# Patient Record
Sex: Male | Born: 1935 | Race: White | Hispanic: No | Smoking: Never smoker
Health system: Southern US, Community
[De-identification: ages and names within clinical notes are randomized; demographics above are authoritative.]

## PROBLEM LIST (undated history)

## (undated) DIAGNOSIS — R413 Other amnesia: Secondary | ICD-10-CM

## (undated) DIAGNOSIS — W19XXXA Unspecified fall, initial encounter: Secondary | ICD-10-CM

## (undated) DIAGNOSIS — R6510 Systemic inflammatory response syndrome (SIRS) of non-infectious origin without acute organ dysfunction: Secondary | ICD-10-CM

## (undated) DIAGNOSIS — G20A1 Parkinson's disease without dyskinesia, without mention of fluctuations: Secondary | ICD-10-CM

## (undated) DIAGNOSIS — Z960 Presence of urogenital implants: Secondary | ICD-10-CM

## (undated) DIAGNOSIS — J111 Influenza due to unidentified influenza virus with other respiratory manifestations: Secondary | ICD-10-CM

## (undated) DIAGNOSIS — S12100A Unspecified displaced fracture of second cervical vertebra, initial encounter for closed fracture: Secondary | ICD-10-CM

## (undated) DIAGNOSIS — R296 Repeated falls: Secondary | ICD-10-CM

## (undated) DIAGNOSIS — Z978 Presence of other specified devices: Secondary | ICD-10-CM

## (undated) DIAGNOSIS — J189 Pneumonia, unspecified organism: Secondary | ICD-10-CM

## (undated) DIAGNOSIS — E119 Type 2 diabetes mellitus without complications: Secondary | ICD-10-CM

## (undated) DIAGNOSIS — F319 Bipolar disorder, unspecified: Secondary | ICD-10-CM

## (undated) DIAGNOSIS — A4902 Methicillin resistant Staphylococcus aureus infection, unspecified site: Secondary | ICD-10-CM

## (undated) DIAGNOSIS — N390 Urinary tract infection, site not specified: Secondary | ICD-10-CM

## (undated) DIAGNOSIS — G20 Parkinson's disease: Secondary | ICD-10-CM

## (undated) DIAGNOSIS — Z974 Presence of external hearing-aid: Secondary | ICD-10-CM

## (undated) DIAGNOSIS — M1990 Unspecified osteoarthritis, unspecified site: Secondary | ICD-10-CM

## (undated) DIAGNOSIS — G709 Myoneural disorder, unspecified: Secondary | ICD-10-CM

## (undated) DIAGNOSIS — I10 Essential (primary) hypertension: Secondary | ICD-10-CM

## (undated) DIAGNOSIS — F419 Anxiety disorder, unspecified: Secondary | ICD-10-CM

## (undated) DIAGNOSIS — F329 Major depressive disorder, single episode, unspecified: Secondary | ICD-10-CM

## (undated) DIAGNOSIS — N400 Enlarged prostate without lower urinary tract symptoms: Secondary | ICD-10-CM

## (undated) DIAGNOSIS — F32A Depression, unspecified: Secondary | ICD-10-CM

## (undated) HISTORY — DX: Urinary tract infection, site not specified: N39.0

## (undated) HISTORY — DX: Other amnesia: R41.3

## (undated) HISTORY — DX: Benign prostatic hyperplasia without lower urinary tract symptoms: N40.0

## (undated) HISTORY — DX: Unspecified fall, initial encounter: W19.XXXA

## (undated) HISTORY — DX: Bipolar disorder, unspecified: F31.9

## (undated) HISTORY — PX: TOTAL HIP ARTHROPLASTY: SHX124

## (undated) HISTORY — DX: Type 2 diabetes mellitus without complications: E11.9

## (undated) HISTORY — DX: Repeated falls: R29.6

## (undated) HISTORY — DX: Influenza due to unidentified influenza virus with other respiratory manifestations: J11.1

## (undated) HISTORY — DX: Parkinson's disease without dyskinesia, without mention of fluctuations: G20.A1

## (undated) HISTORY — DX: Parkinson's disease: G20

---

## 1998-04-19 ENCOUNTER — Ambulatory Visit (HOSPITAL_COMMUNITY): Admission: RE | Admit: 1998-04-19 | Discharge: 1998-04-19 | Payer: Self-pay | Admitting: Urology

## 1998-05-10 ENCOUNTER — Ambulatory Visit (HOSPITAL_COMMUNITY): Admission: RE | Admit: 1998-05-10 | Discharge: 1998-05-10 | Payer: Self-pay | Admitting: Urology

## 1998-05-19 ENCOUNTER — Other Ambulatory Visit: Admission: RE | Admit: 1998-05-19 | Discharge: 1998-05-19 | Payer: Self-pay | Admitting: Urology

## 1999-07-21 ENCOUNTER — Encounter (INDEPENDENT_AMBULATORY_CARE_PROVIDER_SITE_OTHER): Payer: Self-pay | Admitting: Specialist

## 1999-07-21 ENCOUNTER — Ambulatory Visit (HOSPITAL_COMMUNITY): Admission: RE | Admit: 1999-07-21 | Discharge: 1999-07-21 | Payer: Self-pay | Admitting: Gastroenterology

## 2000-07-26 ENCOUNTER — Ambulatory Visit: Admission: RE | Admit: 2000-07-26 | Discharge: 2000-07-26 | Payer: Self-pay | Admitting: Internal Medicine

## 2000-10-11 ENCOUNTER — Encounter: Admission: RE | Admit: 2000-10-11 | Discharge: 2000-10-11 | Payer: Self-pay | Admitting: Internal Medicine

## 2000-10-11 ENCOUNTER — Encounter: Payer: Self-pay | Admitting: Internal Medicine

## 2002-03-05 ENCOUNTER — Encounter: Admission: RE | Admit: 2002-03-05 | Discharge: 2002-03-05 | Payer: Self-pay | Admitting: General Surgery

## 2002-03-05 ENCOUNTER — Encounter: Payer: Self-pay | Admitting: General Surgery

## 2002-03-06 ENCOUNTER — Ambulatory Visit (HOSPITAL_BASED_OUTPATIENT_CLINIC_OR_DEPARTMENT_OTHER): Admission: RE | Admit: 2002-03-06 | Discharge: 2002-03-06 | Payer: Self-pay | Admitting: General Surgery

## 2002-08-21 ENCOUNTER — Encounter: Payer: Self-pay | Admitting: Emergency Medicine

## 2002-08-21 ENCOUNTER — Emergency Department (HOSPITAL_COMMUNITY): Admission: EM | Admit: 2002-08-21 | Discharge: 2002-08-21 | Payer: Self-pay | Admitting: Emergency Medicine

## 2004-08-25 ENCOUNTER — Encounter (INDEPENDENT_AMBULATORY_CARE_PROVIDER_SITE_OTHER): Payer: Self-pay | Admitting: *Deleted

## 2004-08-25 ENCOUNTER — Ambulatory Visit (HOSPITAL_COMMUNITY): Admission: RE | Admit: 2004-08-25 | Discharge: 2004-08-25 | Payer: Self-pay | Admitting: Gastroenterology

## 2004-10-24 ENCOUNTER — Observation Stay (HOSPITAL_COMMUNITY): Admission: EM | Admit: 2004-10-24 | Discharge: 2004-10-26 | Payer: Self-pay

## 2004-10-26 ENCOUNTER — Inpatient Hospital Stay (HOSPITAL_COMMUNITY): Admission: RE | Admit: 2004-10-26 | Discharge: 2004-11-03 | Payer: Self-pay | Admitting: Psychiatry

## 2004-10-26 ENCOUNTER — Ambulatory Visit: Payer: Self-pay | Admitting: Psychiatry

## 2004-11-04 ENCOUNTER — Emergency Department (HOSPITAL_COMMUNITY): Admission: EM | Admit: 2004-11-04 | Discharge: 2004-11-04 | Payer: Self-pay | Admitting: Emergency Medicine

## 2004-11-07 ENCOUNTER — Other Ambulatory Visit (HOSPITAL_COMMUNITY): Admission: RE | Admit: 2004-11-07 | Discharge: 2004-11-15 | Payer: Self-pay | Admitting: Psychiatry

## 2004-11-21 ENCOUNTER — Ambulatory Visit (HOSPITAL_COMMUNITY): Payer: Self-pay | Admitting: Psychiatry

## 2004-12-21 ENCOUNTER — Ambulatory Visit (HOSPITAL_COMMUNITY): Payer: Self-pay | Admitting: Psychiatry

## 2005-03-07 ENCOUNTER — Encounter: Admission: RE | Admit: 2005-03-07 | Discharge: 2005-03-07 | Payer: Self-pay | Admitting: Internal Medicine

## 2005-03-08 ENCOUNTER — Ambulatory Visit (HOSPITAL_COMMUNITY): Payer: Self-pay | Admitting: Psychiatry

## 2005-05-08 ENCOUNTER — Ambulatory Visit (HOSPITAL_COMMUNITY): Payer: Self-pay | Admitting: Psychiatry

## 2005-06-05 ENCOUNTER — Ambulatory Visit (HOSPITAL_COMMUNITY): Payer: Self-pay | Admitting: Psychiatry

## 2005-07-07 ENCOUNTER — Ambulatory Visit (HOSPITAL_COMMUNITY): Payer: Self-pay | Admitting: Psychiatry

## 2005-07-31 ENCOUNTER — Ambulatory Visit (HOSPITAL_COMMUNITY): Payer: Self-pay | Admitting: Psychiatry

## 2005-08-28 ENCOUNTER — Ambulatory Visit (HOSPITAL_COMMUNITY): Payer: Self-pay | Admitting: Psychiatry

## 2005-10-04 ENCOUNTER — Ambulatory Visit (HOSPITAL_COMMUNITY): Payer: Self-pay | Admitting: Psychiatry

## 2005-11-08 ENCOUNTER — Ambulatory Visit (HOSPITAL_COMMUNITY): Payer: Self-pay | Admitting: Psychiatry

## 2005-11-24 ENCOUNTER — Emergency Department (HOSPITAL_COMMUNITY): Admission: EM | Admit: 2005-11-24 | Discharge: 2005-11-24 | Payer: Self-pay | Admitting: Emergency Medicine

## 2005-12-20 ENCOUNTER — Ambulatory Visit (HOSPITAL_COMMUNITY): Payer: Self-pay | Admitting: Psychiatry

## 2006-01-17 ENCOUNTER — Ambulatory Visit (HOSPITAL_COMMUNITY): Payer: Self-pay | Admitting: Psychiatry

## 2006-03-12 ENCOUNTER — Ambulatory Visit (HOSPITAL_COMMUNITY): Payer: Self-pay | Admitting: Psychiatry

## 2006-05-07 ENCOUNTER — Ambulatory Visit (HOSPITAL_COMMUNITY): Payer: Self-pay | Admitting: Psychiatry

## 2006-06-13 ENCOUNTER — Ambulatory Visit (HOSPITAL_COMMUNITY): Payer: Self-pay | Admitting: Psychiatry

## 2006-06-26 ENCOUNTER — Inpatient Hospital Stay (HOSPITAL_COMMUNITY): Admission: RE | Admit: 2006-06-26 | Discharge: 2006-07-04 | Payer: Self-pay | Admitting: Orthopaedic Surgery

## 2006-08-13 ENCOUNTER — Ambulatory Visit (HOSPITAL_COMMUNITY): Payer: Self-pay | Admitting: Psychiatry

## 2006-10-08 ENCOUNTER — Ambulatory Visit (HOSPITAL_COMMUNITY): Payer: Self-pay | Admitting: Psychiatry

## 2006-12-03 ENCOUNTER — Ambulatory Visit (HOSPITAL_COMMUNITY): Payer: Self-pay | Admitting: Psychiatry

## 2007-02-11 ENCOUNTER — Ambulatory Visit (HOSPITAL_COMMUNITY): Payer: Self-pay | Admitting: Psychiatry

## 2007-04-03 ENCOUNTER — Ambulatory Visit (HOSPITAL_COMMUNITY): Payer: Self-pay | Admitting: Psychiatry

## 2007-06-10 ENCOUNTER — Ambulatory Visit (HOSPITAL_COMMUNITY): Payer: Self-pay | Admitting: Psychiatry

## 2007-08-05 ENCOUNTER — Ambulatory Visit (HOSPITAL_COMMUNITY): Payer: Self-pay | Admitting: Psychiatry

## 2007-09-06 ENCOUNTER — Emergency Department (HOSPITAL_COMMUNITY): Admission: EM | Admit: 2007-09-06 | Discharge: 2007-09-06 | Payer: Self-pay | Admitting: Emergency Medicine

## 2007-09-17 ENCOUNTER — Encounter: Admission: RE | Admit: 2007-09-17 | Discharge: 2007-09-17 | Payer: Self-pay | Admitting: Internal Medicine

## 2007-10-28 ENCOUNTER — Ambulatory Visit (HOSPITAL_COMMUNITY): Payer: Self-pay | Admitting: Psychiatry

## 2007-11-19 ENCOUNTER — Encounter: Admission: RE | Admit: 2007-11-19 | Discharge: 2007-12-31 | Payer: Self-pay | Admitting: Neurology

## 2007-12-02 ENCOUNTER — Ambulatory Visit (HOSPITAL_COMMUNITY): Payer: Self-pay | Admitting: Psychiatry

## 2007-12-06 ENCOUNTER — Encounter: Admission: RE | Admit: 2007-12-06 | Discharge: 2008-03-05 | Payer: Self-pay | Admitting: Internal Medicine

## 2008-01-01 ENCOUNTER — Ambulatory Visit (HOSPITAL_BASED_OUTPATIENT_CLINIC_OR_DEPARTMENT_OTHER): Admission: RE | Admit: 2008-01-01 | Discharge: 2008-01-01 | Payer: Self-pay | Admitting: Urology

## 2008-02-03 ENCOUNTER — Ambulatory Visit (HOSPITAL_COMMUNITY): Payer: Self-pay | Admitting: Psychiatry

## 2008-03-09 ENCOUNTER — Ambulatory Visit (HOSPITAL_COMMUNITY): Payer: Self-pay | Admitting: Psychiatry

## 2008-04-15 ENCOUNTER — Ambulatory Visit (HOSPITAL_COMMUNITY): Payer: Self-pay | Admitting: Psychiatry

## 2008-05-18 ENCOUNTER — Ambulatory Visit (HOSPITAL_COMMUNITY): Payer: Self-pay | Admitting: Psychiatry

## 2008-05-21 ENCOUNTER — Inpatient Hospital Stay (HOSPITAL_COMMUNITY): Admission: RE | Admit: 2008-05-21 | Discharge: 2008-05-25 | Payer: Self-pay | Admitting: Orthopaedic Surgery

## 2008-05-22 ENCOUNTER — Ambulatory Visit: Payer: Self-pay | Admitting: Physical Medicine & Rehabilitation

## 2008-05-25 ENCOUNTER — Inpatient Hospital Stay (HOSPITAL_COMMUNITY)
Admission: RE | Admit: 2008-05-25 | Discharge: 2008-06-02 | Payer: Self-pay | Admitting: Physical Medicine & Rehabilitation

## 2008-08-17 ENCOUNTER — Ambulatory Visit (HOSPITAL_COMMUNITY): Payer: Self-pay | Admitting: Psychiatry

## 2008-08-18 ENCOUNTER — Ambulatory Visit (HOSPITAL_BASED_OUTPATIENT_CLINIC_OR_DEPARTMENT_OTHER): Admission: RE | Admit: 2008-08-18 | Discharge: 2008-08-18 | Payer: Self-pay | Admitting: Urology

## 2008-10-19 ENCOUNTER — Ambulatory Visit (HOSPITAL_COMMUNITY): Payer: Self-pay | Admitting: Psychiatry

## 2009-01-27 ENCOUNTER — Ambulatory Visit (HOSPITAL_COMMUNITY): Payer: Self-pay | Admitting: Psychiatry

## 2009-04-19 ENCOUNTER — Ambulatory Visit (HOSPITAL_BASED_OUTPATIENT_CLINIC_OR_DEPARTMENT_OTHER): Admission: RE | Admit: 2009-04-19 | Discharge: 2009-04-19 | Payer: Self-pay | Admitting: Urology

## 2009-04-23 ENCOUNTER — Inpatient Hospital Stay (HOSPITAL_COMMUNITY): Admission: AD | Admit: 2009-04-23 | Discharge: 2009-05-03 | Payer: Self-pay | Admitting: Neurology

## 2009-04-29 ENCOUNTER — Ambulatory Visit: Payer: Self-pay | Admitting: Physical Medicine & Rehabilitation

## 2009-06-09 ENCOUNTER — Ambulatory Visit (HOSPITAL_COMMUNITY): Payer: Self-pay | Admitting: Psychiatry

## 2009-06-20 ENCOUNTER — Emergency Department (HOSPITAL_COMMUNITY): Admission: EM | Admit: 2009-06-20 | Discharge: 2009-06-21 | Payer: Self-pay | Admitting: Emergency Medicine

## 2009-07-10 ENCOUNTER — Emergency Department (HOSPITAL_COMMUNITY): Admission: EM | Admit: 2009-07-10 | Discharge: 2009-07-10 | Payer: Self-pay | Admitting: Emergency Medicine

## 2009-07-14 ENCOUNTER — Ambulatory Visit (HOSPITAL_COMMUNITY): Admission: RE | Admit: 2009-07-14 | Discharge: 2009-07-14 | Payer: Self-pay | Admitting: Urology

## 2009-08-16 ENCOUNTER — Ambulatory Visit (HOSPITAL_COMMUNITY): Payer: Self-pay | Admitting: Psychiatry

## 2009-09-06 ENCOUNTER — Emergency Department (HOSPITAL_COMMUNITY): Admission: EM | Admit: 2009-09-06 | Discharge: 2009-09-06 | Payer: Self-pay | Admitting: Emergency Medicine

## 2009-09-08 ENCOUNTER — Emergency Department (HOSPITAL_COMMUNITY): Admission: EM | Admit: 2009-09-08 | Discharge: 2009-09-09 | Payer: Self-pay | Admitting: Emergency Medicine

## 2009-09-28 ENCOUNTER — Encounter: Admission: RE | Admit: 2009-09-28 | Discharge: 2009-11-23 | Payer: Self-pay | Admitting: Internal Medicine

## 2009-10-01 ENCOUNTER — Emergency Department (HOSPITAL_COMMUNITY): Admission: EM | Admit: 2009-10-01 | Discharge: 2009-10-01 | Payer: Self-pay | Admitting: Emergency Medicine

## 2009-11-03 ENCOUNTER — Ambulatory Visit (HOSPITAL_COMMUNITY): Payer: Self-pay | Admitting: Psychiatry

## 2009-11-09 ENCOUNTER — Emergency Department (HOSPITAL_COMMUNITY): Admission: EM | Admit: 2009-11-09 | Discharge: 2009-11-09 | Payer: Self-pay | Admitting: Emergency Medicine

## 2009-11-18 ENCOUNTER — Emergency Department (HOSPITAL_COMMUNITY): Admission: EM | Admit: 2009-11-18 | Discharge: 2009-11-18 | Payer: Self-pay | Admitting: Emergency Medicine

## 2009-12-31 ENCOUNTER — Ambulatory Visit (HOSPITAL_COMMUNITY): Payer: Self-pay | Admitting: Psychiatry

## 2010-01-08 ENCOUNTER — Emergency Department (HOSPITAL_COMMUNITY): Admission: EM | Admit: 2010-01-08 | Discharge: 2010-01-08 | Payer: Self-pay | Admitting: Emergency Medicine

## 2010-02-22 ENCOUNTER — Encounter: Admission: RE | Admit: 2010-02-22 | Discharge: 2010-02-22 | Payer: Self-pay | Admitting: Internal Medicine

## 2010-03-04 ENCOUNTER — Ambulatory Visit (HOSPITAL_COMMUNITY): Payer: Self-pay | Admitting: Psychiatry

## 2010-05-11 ENCOUNTER — Emergency Department (HOSPITAL_COMMUNITY)
Admission: EM | Admit: 2010-05-11 | Discharge: 2010-05-11 | Payer: Self-pay | Source: Home / Self Care | Admitting: Emergency Medicine

## 2010-05-13 ENCOUNTER — Emergency Department (HOSPITAL_COMMUNITY)
Admission: EM | Admit: 2010-05-13 | Discharge: 2010-05-13 | Payer: Self-pay | Source: Home / Self Care | Admitting: Emergency Medicine

## 2010-05-27 ENCOUNTER — Ambulatory Visit (HOSPITAL_COMMUNITY): Payer: Self-pay | Admitting: Psychiatry

## 2010-06-10 ENCOUNTER — Ambulatory Visit (HOSPITAL_COMMUNITY): Payer: Self-pay | Admitting: Psychiatry

## 2010-08-02 ENCOUNTER — Encounter: Admission: RE | Admit: 2010-08-02 | Discharge: 2010-08-02 | Payer: Self-pay | Admitting: Internal Medicine

## 2010-08-10 ENCOUNTER — Ambulatory Visit (HOSPITAL_COMMUNITY): Payer: Self-pay | Admitting: Psychiatry

## 2010-09-20 ENCOUNTER — Encounter: Admission: RE | Admit: 2010-09-20 | Discharge: 2010-09-20 | Payer: Self-pay | Admitting: Internal Medicine

## 2010-10-07 ENCOUNTER — Ambulatory Visit (HOSPITAL_COMMUNITY): Payer: Self-pay | Admitting: Psychiatry

## 2010-11-04 ENCOUNTER — Encounter: Admission: RE | Admit: 2010-11-04 | Discharge: 2010-11-04 | Payer: Self-pay | Admitting: Internal Medicine

## 2010-11-06 ENCOUNTER — Emergency Department (HOSPITAL_COMMUNITY): Admission: EM | Admit: 2010-11-06 | Discharge: 2010-11-06 | Payer: Self-pay | Admitting: Emergency Medicine

## 2010-12-02 ENCOUNTER — Ambulatory Visit (HOSPITAL_COMMUNITY): Payer: Self-pay | Admitting: Psychiatry

## 2010-12-29 ENCOUNTER — Emergency Department (HOSPITAL_COMMUNITY)
Admission: EM | Admit: 2010-12-29 | Discharge: 2010-12-29 | Payer: Self-pay | Source: Home / Self Care | Admitting: Emergency Medicine

## 2011-01-27 ENCOUNTER — Encounter (HOSPITAL_COMMUNITY): Payer: Self-pay | Admitting: Psychiatry

## 2011-02-01 ENCOUNTER — Encounter (HOSPITAL_COMMUNITY): Payer: Self-pay | Admitting: Psychiatry

## 2011-02-08 ENCOUNTER — Encounter (HOSPITAL_COMMUNITY): Payer: Self-pay | Admitting: Psychiatry

## 2011-02-24 ENCOUNTER — Encounter (HOSPITAL_COMMUNITY): Payer: BC Managed Care – PPO | Admitting: Psychiatry

## 2011-02-27 ENCOUNTER — Encounter (HOSPITAL_COMMUNITY): Payer: Medicare Other | Admitting: Psychiatry

## 2011-02-27 DIAGNOSIS — F3189 Other bipolar disorder: Secondary | ICD-10-CM

## 2011-02-28 LAB — URINALYSIS, ROUTINE W REFLEX MICROSCOPIC
Glucose, UA: NEGATIVE mg/dL
pH: 6.5 (ref 5.0–8.0)

## 2011-02-28 LAB — URINE MICROSCOPIC-ADD ON

## 2011-02-28 LAB — URINE CULTURE

## 2011-03-03 ENCOUNTER — Other Ambulatory Visit: Payer: Self-pay | Admitting: Internal Medicine

## 2011-03-03 DIAGNOSIS — Z8781 Personal history of (healed) traumatic fracture: Secondary | ICD-10-CM

## 2011-03-05 LAB — URINE MICROSCOPIC-ADD ON

## 2011-03-05 LAB — URINALYSIS, ROUTINE W REFLEX MICROSCOPIC
Glucose, UA: NEGATIVE mg/dL
pH: 7.5 (ref 5.0–8.0)

## 2011-03-05 LAB — URINE CULTURE

## 2011-03-06 LAB — URINE MICROSCOPIC-ADD ON

## 2011-03-06 LAB — URINALYSIS, ROUTINE W REFLEX MICROSCOPIC
Glucose, UA: NEGATIVE mg/dL
Protein, ur: NEGATIVE mg/dL
pH: 7 (ref 5.0–8.0)

## 2011-03-06 LAB — URINE CULTURE: Colony Count: >=100000

## 2011-03-13 ENCOUNTER — Other Ambulatory Visit: Payer: BC Managed Care – PPO

## 2011-03-14 ENCOUNTER — Ambulatory Visit
Admission: RE | Admit: 2011-03-14 | Discharge: 2011-03-14 | Disposition: A | Discharge status: Home / Self Care | Payer: BC Managed Care – PPO | Source: Ambulatory Visit | Attending: Internal Medicine | Admitting: Internal Medicine

## 2011-03-14 ENCOUNTER — Other Ambulatory Visit: Payer: BC Managed Care – PPO

## 2011-03-14 DIAGNOSIS — Z8781 Personal history of (healed) traumatic fracture: Secondary | ICD-10-CM

## 2011-03-21 LAB — URINALYSIS, ROUTINE W REFLEX MICROSCOPIC
Glucose, UA: NEGATIVE mg/dL
Nitrite: NEGATIVE
Protein, ur: NEGATIVE mg/dL

## 2011-03-21 LAB — URINE CULTURE: Colony Count: >=100000

## 2011-03-21 LAB — URINE MICROSCOPIC-ADD ON

## 2011-03-22 LAB — URINALYSIS, ROUTINE W REFLEX MICROSCOPIC
Bilirubin Urine: NEGATIVE
Glucose, UA: NEGATIVE mg/dL
Hgb urine dipstick: NEGATIVE
Ketones, ur: NEGATIVE mg/dL
Protein, ur: NEGATIVE mg/dL

## 2011-03-22 LAB — URINE CULTURE: Colony Count: >=100000

## 2011-03-22 LAB — GLUCOSE, CAPILLARY: Glucose-Capillary: 101 mg/dL — ABNORMAL HIGH (ref 70–99)

## 2011-03-22 LAB — URINE MICROSCOPIC-ADD ON

## 2011-03-24 LAB — URINALYSIS, ROUTINE W REFLEX MICROSCOPIC
Bilirubin Urine: NEGATIVE
Glucose, UA: NEGATIVE mg/dL
Hgb urine dipstick: NEGATIVE
Ketones, ur: NEGATIVE mg/dL
Ketones, ur: NEGATIVE mg/dL
Nitrite: POSITIVE — AB
Protein, ur: NEGATIVE mg/dL
pH: 7 (ref 5.0–8.0)
pH: 7.5 (ref 5.0–8.0)

## 2011-03-24 LAB — URINE CULTURE: Colony Count: 90000

## 2011-03-24 LAB — URINE MICROSCOPIC-ADD ON

## 2011-03-26 LAB — URINALYSIS, ROUTINE W REFLEX MICROSCOPIC
Bilirubin Urine: NEGATIVE
Bilirubin Urine: NEGATIVE
Glucose, UA: NEGATIVE mg/dL
Ketones, ur: NEGATIVE mg/dL
Nitrite: POSITIVE — AB
Specific Gravity, Urine: 1.019 (ref 1.005–1.030)
Specific Gravity, Urine: 1.014 (ref 1.005–1.030)
Urobilinogen, UA: 1 mg/dL (ref 0.0–1.0)
pH: 7 (ref 5.0–8.0)

## 2011-03-26 LAB — BASIC METABOLIC PANEL
BUN: 25 mg/dL — ABNORMAL HIGH (ref 6–23)
CO2: 27 mEq/L (ref 19–32)
Calcium: 9.5 mg/dL (ref 8.4–10.5)
Creatinine, Ser: 1.09 mg/dL (ref 0.4–1.5)
GFR calc non Af Amer: >60 mL/min (ref >60)
Glucose, Bld: 97 mg/dL (ref 70–99)
Sodium: 140 mEq/L (ref 135–145)

## 2011-03-26 LAB — URINE CULTURE: Colony Count: 85000

## 2011-03-26 LAB — GLUCOSE, CAPILLARY
Glucose-Capillary: 113 mg/dL — ABNORMAL HIGH (ref 70–99)
Glucose-Capillary: 138 mg/dL — ABNORMAL HIGH (ref 70–99)
Glucose-Capillary: 85 mg/dL (ref 70–99)

## 2011-03-26 LAB — CBC
Hemoglobin: 12.7 g/dL — ABNORMAL LOW (ref 13.0–17.0)
MCHC: 33.1 g/dL (ref 30.0–36.0)
Platelets: 344 10*3/uL (ref 150–400)
RDW: 15.6 % — ABNORMAL HIGH (ref 11.5–15.5)

## 2011-03-26 LAB — URINE MICROSCOPIC-ADD ON

## 2011-03-28 LAB — GLUCOSE, CAPILLARY
Glucose-Capillary: 109 mg/dL — ABNORMAL HIGH (ref 70–99)
Glucose-Capillary: 117 mg/dL — ABNORMAL HIGH (ref 70–99)
Glucose-Capillary: 88 mg/dL (ref 70–99)
Glucose-Capillary: 100 mg/dL — ABNORMAL HIGH (ref 70–99)
Glucose-Capillary: 102 mg/dL — ABNORMAL HIGH (ref 70–99)
Glucose-Capillary: 104 mg/dL — ABNORMAL HIGH (ref 70–99)
Glucose-Capillary: 107 mg/dL — ABNORMAL HIGH (ref 70–99)
Glucose-Capillary: 121 mg/dL — ABNORMAL HIGH (ref 70–99)
Glucose-Capillary: 122 mg/dL — ABNORMAL HIGH (ref 70–99)
Glucose-Capillary: 124 mg/dL — ABNORMAL HIGH (ref 70–99)
Glucose-Capillary: 127 mg/dL — ABNORMAL HIGH (ref 70–99)
Glucose-Capillary: 128 mg/dL — ABNORMAL HIGH (ref 70–99)
Glucose-Capillary: 140 mg/dL — ABNORMAL HIGH (ref 70–99)
Glucose-Capillary: 142 mg/dL — ABNORMAL HIGH (ref 70–99)
Glucose-Capillary: 151 mg/dL — ABNORMAL HIGH (ref 70–99)
Glucose-Capillary: 87 mg/dL (ref 70–99)
Glucose-Capillary: 88 mg/dL (ref 70–99)

## 2011-03-28 LAB — COMPREHENSIVE METABOLIC PANEL
AST: 10 U/L (ref 0–37)
AST: 15 U/L (ref 0–37)
Albumin: 2.9 g/dL — ABNORMAL LOW (ref 3.5–5.2)
BUN: 16 mg/dL (ref 6–23)
CO2: 20 mEq/L (ref 19–32)
CO2: 26 mEq/L (ref 19–32)
Calcium: 8.9 mg/dL (ref 8.4–10.5)
Chloride: 108 mEq/L (ref 96–112)
Creatinine, Ser: 1.25 mg/dL (ref 0.4–1.5)
Creatinine, Ser: 4.47 mg/dL — ABNORMAL HIGH (ref 0.4–1.5)
GFR calc Af Amer: 16 mL/min — ABNORMAL LOW (ref >60)
GFR calc Af Amer: >60 mL/min (ref >60)
GFR calc non Af Amer: 13 mL/min — ABNORMAL LOW (ref >60)
GFR calc non Af Amer: 57 mL/min — ABNORMAL LOW (ref >60)
Glucose, Bld: 95 mg/dL (ref 70–99)
Total Bilirubin: 0.7 mg/dL (ref 0.3–1.2)

## 2011-03-28 LAB — DIFFERENTIAL
Basophils Absolute: 0 10*3/uL (ref 0.0–0.1)
Eosinophils Absolute: 0.1 10*3/uL (ref 0.0–0.7)
Eosinophils Relative: 1 % (ref 0–5)
Lymphocytes Relative: 5 % — ABNORMAL LOW (ref 12–46)

## 2011-03-28 LAB — BASIC METABOLIC PANEL
BUN: 19 mg/dL (ref 6–23)
Creatinine, Ser: 1.27 mg/dL (ref 0.4–1.5)
GFR calc non Af Amer: 56 mL/min — ABNORMAL LOW (ref >60)
Glucose, Bld: 218 mg/dL — ABNORMAL HIGH (ref 70–99)
Potassium: 3.7 mEq/L (ref 3.5–5.1)

## 2011-03-28 LAB — CBC
HCT: 43.9 % (ref 39.0–52.0)
Hemoglobin: 14.8 g/dL (ref 13.0–17.0)
MCHC: 34.2 g/dL (ref 30.0–36.0)
MCV: 92.1 fL (ref 78.0–100.0)
MCV: 93.3 fL (ref 78.0–100.0)
Platelets: 257 10*3/uL (ref 150–400)
RBC: 4.7 MIL/uL (ref 4.22–5.81)
WBC: 13.1 10*3/uL — ABNORMAL HIGH (ref 4.0–10.5)

## 2011-03-28 LAB — POCT I-STAT 4, (NA,K, GLUC, HGB,HCT)
HCT: 44 % (ref 39.0–52.0)
Hemoglobin: 15 g/dL (ref 13.0–17.0)

## 2011-03-28 LAB — URINALYSIS, ROUTINE W REFLEX MICROSCOPIC
Bilirubin Urine: NEGATIVE
Specific Gravity, Urine: 1.011 (ref 1.005–1.030)
pH: 6.5 (ref 5.0–8.0)

## 2011-03-28 LAB — TSH: TSH: 3.044 u[IU]/mL (ref 0.350–4.500)

## 2011-03-28 LAB — URINE MICROSCOPIC-ADD ON

## 2011-03-28 LAB — PROTIME-INR: Prothrombin Time: 12.9 seconds (ref 11.6–15.2)

## 2011-03-28 LAB — LITHIUM LEVEL: Lithium Lvl: 1.02 mEq/L (ref 0.80–1.40)

## 2011-04-24 ENCOUNTER — Encounter (INDEPENDENT_AMBULATORY_CARE_PROVIDER_SITE_OTHER): Payer: BC Managed Care – PPO | Admitting: Psychiatry

## 2011-04-24 DIAGNOSIS — F3189 Other bipolar disorder: Secondary | ICD-10-CM

## 2011-05-02 NOTE — Op Note (Signed)
NAME:  [name] NO.:  [id number]   MEDICAL RECORD NO.:  [id number]          PATIENT TYPE:  INP   LOCATION:  5001                         FACILITY:  [facility]   PHYSICIAN:  Claude Manges. Whitfield, M.D.DATE OF BIRTH:  10/03/1935   DATE OF PROCEDURE:  05/21/2008  DATE OF DISCHARGE:                               OPERATIVE REPORT   PREOPERATIVE DIAGNOSIS:  End-stage osteoarthritis, right hip.   POSTOPERATIVE DIAGNOSIS:  End-stage osteoarthritis, right hip.   PROCEDURE:  Right total hip replacement.   SURGEON:  Claude Manges. Cleophas Dunker, MD.   ASSISTANTThereasa Distance A. Chaney Malling, MD, and Oris Drone. Petrarca, P.A.-C.   ANESTHESIA:  General orotracheal.   COMPLICATIONS:  None.   COMPONENTS:  DePuy AML small stature 15-mm femoral component, a 36-mm  outer diameter hip ball with a 5-mm neck length, a 58-mm outer diameter  metallic acetabular component with the Marathon polyethylene liner 10-  degree posterior lip.  All were press fit.   PROCEDURE IN DETAILS:  The patient was comfortable on the operating  table; and under general orotracheal anesthesia, nursing staff inserted  a Foley catheter.  There was noted to be slight amount of blood after  several minutes.  It was clear on the initial insertion.   The patient was then placed in the lateral decubitus position with the  right side up and secured to the operating room table with the Innomed  hip system.   The right hip was then prepped with Betadine scrub and DuraPrep from the  iliac crest to below the knee.  Sterile draping was performed.   A routine southern incision was utilized and via sharp dissection  carried down to subcutaneous tissue.  Any gross bleeders were Bovie  coagulated.  The iliotibial band was identified and incised along the  skin insertion.  Self-retaining retractors were inserted.  The short  external rotators were identified.  There were incised from their  posterior attachment to the greater  trochanter.  Tendinous structures  were tagged with 0 Ethibond suture.  Capsule was identified and incised  on the femoral neck and head.  There was immediate clear yellow joint  effusion and bulging of beefy-red synovitis.   The head was then dislocated posteriorly.  There were large osteophytes  with malformation of the head.  It was osteotomized about a  fingerbreadth proximal to the lesser trochanter using the calcar guide.  The acetabulum was inspected.  There was abundant proliferative beefy-  red synovitis.  Synovectomy was performed.   The femoral neck was easily identified.  Retractors were placed about  it.  The piriformis fossa was cleared of any soft tissue.  A starter  hole was then made followed by reaming to 14.5 mm to accept the 15-mm  prosthesis.  Rasping was then performed sequentially to 15 mm small with  a perfect fit on the calcar.   Retractor was then placed about the acetabulum.  Any further synovitis  was removed.  Reaming was performed sequentially to 57 mm outer diameter  to accept a 58 prosthesis.  We trialed a 56 and a 58  component.  We had  complete rim fit of the 56.  We had very nice rim fit in the 58; it  would not completely sit.  Accordingly, we used a 100 series AML  metallic cup and impacted it into the acetabulum.  We inserted the trial  polyethylene liner.   The 15-mm small stature rasp was then impacted flush on the femoral  calcar.  We trialed several neck lengths with a 36-mm hip ball,  felt  that the +5 maintained our leg length evenly, and we had excellent  stability with flexion/adduction.   The wound was then copiously irrigated with saline solution.  The trial  components were removed.  The joint was again irrigated with saline  solution.  The apex hole eliminator was inserted followed by the final  polyethylene Marathon liner with a 10-degree posterior lip.  Wound was  again irrigated with saline solution.   The final femoral  component was then impacted flush on the calcar.  We  again trialed a +5 neck length, 36-mm hip ball with perfect stability.   The hip was then dislocated.  The trial hip ball was removed.  The final  36-mm hip ball with a +5 neck was then inserted on the [facility] taper neck.  This was reduced, and again through full range of motion, we had perfect  stability in flexion/extension.  I felt that leg lengths were  symmetrical.   Wound was again irrigated with saline solution.  The capsule was then  closed anatomically with #1 Ethibond.  The short external rotator was  closed in similar fashion with similar material.  I could palpate the  sciatic nerve throughout the procedure, and it was protected.   The iliotibial band was then closed with a running 0 Vicryl, the subcu  with 0 and 2-0 Vicryl, and skin closed with skin clips.  Sterile bulky  dressing was applied.  The patient was then awoken and placed supine on  the operating stretcher and returned to the postanesthesia recovery room  in satisfactory condition.      Claude Manges. Cleophas Dunker, M.D.  Electronically Signed     PWW/MEDQ  D:  05/21/2008  T:  05/22/2008  Job:  [id number]

## 2011-05-02 NOTE — Op Note (Signed)
NAME:  [name], [name] NO.:  [id number]   MEDICAL RECORD NO.:  [id number]          PATIENT TYPE:  AMB   LOCATION:  NESC                         [facility]ACILITY:  [facility]   PHYSICIAN:  Martina Sinner, MD DATE OF BIRTH:  23-Nov-1935   DATE OF PROCEDURE:  08/18/2008  DATE OF DISCHARGE:                               OPERATIVE REPORT   PREOPERATIVE DIAGNOSES:  Muscle spasm, urge incontinence, neurogenic  bladder.   POSTOPERATIVE DIAGNOSES:  Muscle spasm, urge incontinence, neurogenic  bladder, meatal stenosis and small bladder calculi.   SURGERY:  Cystoscopy, bladder hydrodistention, transurethral injection  of Botox, transurethral removal of bladder stones and urethral  dilatation.   [name] has the above diagnoses.  He consented to 100 units of  Botox.  In spite of using the obturator, it was obvious that he had mild  meatal stenosis and the larger scope was not going to be inserted into  his bladder.  I dilated it with male sounds to 36 [place].  I therefore  inserted the ACMI scope with the obturator well lubricated in the mid  urethra, and directly visualized the scope entering through the urethra  into the bladder.  There was no question he had a longer prostatic  urethra, bilobar enlargement of the prostate.  The membranous bulbar and  penile urethra were otherwise normal.  He did not have a stricture.  He  did have oozing from a friable bladder neck.  He had grade II/IV bladder  trabeculation.  He had some small calculi probably 1 mm and possibly 2  mm in size.   He was hydrodistended to 600 mL.  He did not have findings of  interstitial cystitis.   Using 100 units of Botox in the ACMI scope, I injected 100 units of  Botox in 20 sites using my usual template at 5 and 7 o'clock and  cephalad to the interureteric ridge  with 0.5 mL per injection into the  muscle.   I then used a regular 22-French scope and irrigated out all his bladder  calculi.  It  was sand-like and I did not send it for analysis.  He had a  little bit of bleeding from the bladder neck and a small clot was  irrigated.   I put an 18-French catheter in at the end of the case and the urine was  clear.  I will keep it in and then likely take it out in the recovery  room.  I will keep a close eye on [name] residuals since he is  at much higher risk of retention.           ______________________________  Martina Sinner, MD  Electronically Signed     SAM/MEDQ  D:  08/18/2008  T:  08/18/2008  Job:  [id number]

## 2011-05-02 NOTE — Op Note (Signed)
NAME:  JAHAZIEL, FRANCOIS NO.:  [id number]   MEDICAL RECORD NO.:  [id number]          PATIENT TYPE:  AMB   LOCATION:  DAY                          FACILITY:  [facility]   PHYSICIAN:  Sigmund I. Patsi Sears, M.D.DATE OF BIRTH:  June 15, 1935   DATE OF PROCEDURE:  07/14/2009  DATE OF DISCHARGE:  07/14/2009                               OPERATIVE REPORT   PREOPERATIVE DIAGNOSIS:  Urethral bleeding.   POSTOPERATIVE DIAGNOSIS:  Urethral bleeding.   OPERATION:  Cystourethroscopy, electrocoagulation of urethral erosion,  cystoscopy, and bladder clot evacuation.   SURGEON:  Dr. Patsi Sears.   ANESTHESIA:  General LMA.   OPERATION:  After appropriate preanesthesia, the patient was brought to  the operating room and placed on the operating room in dorsal supine  position where general LMA anesthesia was induced.  He was then placed  in a dorsal lithotomy position where the pubis was prepped with Betadine  solution and draped in usual fashion.   HISTORY:  This 76 year old male disabled secondary to Parkinson's  disease, with chronic Foley catheter for chronic urinary retention,  developed severe gross hematuria with cystoscopy showing urethral  arterial bleed at the 6 o'clock position at the penoscrotal junction.  The patient was brought to the operating room tonight for fulguration.   PROCEDURE:  Cystourethroscopy was accomplished after the Foley catheter  removed, with hematuria and clot noted within the urethra.  Arterial  bleeding point was noted at the 6 o'clock position in the membranous  urethra at the penoscrotal junction, and this was electrocoagulated with  the Bugbee electrode.  Cystoscopy revealed clot in the bladder, but no  tumor was identified.  The prostate appeared somewhat edematous and had  bilobar BPH, but there was no evidence of bleeding.  The bladder was  evacuated of clot with the piston syringe.  A 22-Foley catheter was  replaced and the balloon inflated.   The patient was awakened and taken  to recovery room in good condition.      Sigmund I. Patsi Sears, M.D.  Electronically Signed     SIT/MEDQ  D:  07/14/2009  T:  07/15/2009  Job:  [id number]

## 2011-05-02 NOTE — Op Note (Signed)
NAME:  SHIGEO, BAUGH NO.:  [id number]   MEDICAL RECORD NO.:  [id number]         PATIENT TYPE:  LINP   LOCATION:                               FACILITY:  [facility]   PHYSICIAN:  Ronald L. Earlene Plater, M.D.  DATE OF BIRTH:  1935/04/28   DATE OF PROCEDURE:  01/01/2008  DATE OF DISCHARGE:                               OPERATIVE REPORT   DIAGNOSIS:  Right hydrocele.   PROCEDURE:  Right hydrocelectomy.   SURGEON:  Gaynelle Arabian, M.D.   ANESTHESIA:  LMA.   BLOOD LOSS:  20 mL.   TUBES:  Quarter-inch Penrose drain.   COMPLICATIONS:  None.   INDICATIONS FOR PROCEDURE:  [name] is a very nice 76 year old white male  who presents with an enlarging right hydrocele.  On scrotal ultrasound,  the testicle appeared to be normal but is a tense uncomfortable, right  hydrocele.  After understanding risks, benefits and alternatives, he has  elected to proceed with repair.   PROCEDURE IN DETAIL:  The patient was placed in supine position.  After  proper LMA anesthesia, he was prepped and draped with Betadine in  sterile fashion.  A transverse scrotal incision was made to include a  very small sebaceous cyst in the scrotum.  The tunic vaginalis was  incised and straw-colored fluid was removed.  The testicle was delivered  and appeared to be essentially normal.  Redundant hydrocele sac was  excised.  It was quite thin.  Good hemostasis was noted be present, and  a bottle-neck type repair was performed with running 3-0 chromic catgut.  No no restriction of the blood supply was noted.  Thorough irrigation  was performed.  Good hemostasis noted present.  The testicle and  contents were placed back into the scrotal sac.  A quarter-inch Penrose  drain was placed through a separate stab incision and sutured in place  with 3-0 chromic catgut.  The dartos tunic was closed with a running 3-0  chromic catgut, and the skin was closed with a running horizontal  mattress 4-0 chromic catgut and  dressed with fluffs and a scrotal  support.  The patient tolerated the procedure well.  No complications.  He was taken to the recovery room stable.      Ronald L. Earlene Plater, M.D.  Electronically Signed     RLD/MEDQ  D:  01/01/2008  T:  01/01/2008  Job:  [id number]

## 2011-05-02 NOTE — Discharge Summary (Signed)
NAME:  [name] NO.:  [id number]   MEDICAL RECORD NO.:  [id number]          PATIENT TYPE:  INP   LOCATION:  3038                         FACILITY:  [facility]   PHYSICIAN:  Marlan Palau, M.D.  DATE OF BIRTH:  1935/04/06   DATE OF ADMISSION:  04/23/2009  DATE OF DISCHARGE:  05/03/2009                               DISCHARGE SUMMARY   ADMISSION DIAGNOSES:  1. Parkinson's disease.  2. Organic brain syndrome.  3. Bipolar disorder.  4. Gait disorder.   DISCHARGE DIAGNOSES:  1. Parkinson's disease.  2. Gait disorder.  3. Organic brain syndrome.  4. Bipolar disorder.  5. Diabetes.  6. Obstructive uropathy with acute renal failure.   PROCEDURES DURING THIS ADMISSION:  CT scan of the brain.   COMPLICATIONS ABOVE PROCEDURES:  None.   HISTORY OF PRESENT ILLNESS:  [name] is a 76 year old right-handed  white male with a history of Parkinson's disease.  This patient has had  bipolar disorder but has had a very rapid decline in functional level  over the last several weeks prior to this admission.  The patient had  become increasingly confused, hallucinating, and having problems with  p.o. intake of food and fluids.  The patient has basically not been  ambulatory prior to admission and gotten to the point where the wife can  no longer care for him in the home environment.  The patient will fall  on the floor and cannot get up.  The patient has been increasingly  agitated.  The patient is brought into the hospital for an evaluation of  the above problem.   PAST MEDICAL HISTORY:  1. Parkinson disease.  2. Gait disorder.  3. Organic brain syndrome.  4. Bipolar disorder.  5. Diabetes.  6. Hypertension.  7. Dyslipidemia.  8. Total hip replacement on the left.  9. Left shoulder arthritis.  10.Benign prostatic hypertrophy.  11.Obstructive uropathy on admission.  12.Lithium toxicity secondary to obstructive uropathy.   MEDICATIONS PRIOR TO ADMISSION:  1. Lamictal 150 mg tablets 1 twice daily.  2. Metformin 500 mg twice daily.  3. Hydrochlorothiazide 25 mg daily.  4. Temazepam 30 mg as needed for sleep.  5. Ramipril 5 mg one twice daily.  6. Lithium 300 mg in the morning and 600 mg in the evening.  7. Zoloft 100 mg daily.  8. Aspirin 81 mg daily.  9. Fish Oil tablets daily.  10.Sinemet 25/100 one 3 times daily.  11.Flomax 0.4 mg 1 at night.   The patient has no known allergies and does not smoke.  Drinks wine on  occasion.   Please refer to history and physical dictation summary for family  history, social history, and physical examination.   REVIEW OF SYSTEMS:  Laboratory values at this time are notable for:  White count of 9.5, hemoglobin 12.5, hematocrit 36.7, MCV of 92.1  platelets of 257,000.  Sodium 141, potassium 3.3, chloride of 113, CO2  26, glucose of 121, BUN of 16, creatinine of 1.25, total bili 0.8,  alkaline phosphatase of 71, SGOT of 10, SGPT of less than 8, total  protein 5, albumin 2.9,  calcium 8.9, TSH of 3.044.  Initial lithium  level is 1.85.  Urinalysis reveals specific gravity of 0.011, pH of 6.5,  100 mg/dl protein, 0-2 white cells.  A repeat lithium level at a lower  dose of 300 mg twice daily was 1.02.   HOSPITAL COURSE:  This patient was admitted for evaluation of a decline  in functional level with increased agitation, confusion, and decreased  ability to ambulate.  The patient had recently gotten Botox injections  in the bladder and subsequently had urinary outflow obstruction that was  in part related to this.  The patient was admitted with an obstructive  uropathy, a creatinine of greater than 4.  The patient had a Foley  catheter placed and has had subsequent normalization of renal function.  Lithium toxicity was noted with this.  Lithium dosing was reduced to 300  mg twice daily and repeat lithium level was in the therapeutic range.  The patient was increased on Sinemet 25/100 one 4 times daily.   The  patient has been seen by Physical and Occupational Therapy.  Over time,  the patient's cognitive functioning has improved somewhat but the  patient continues to have a significant memory impairment.  The  patient's stiffness and apraxia has also improved.  The patient still  was not functionally ambulatory.  The patient at this point cannot be  managed in the home environment.  The patient is to be transferred to an  extended care facility with rehab.  The patient will need followup with  his neurologist, Dr. Jacquelyne Balint, to determine when the Foley catheter can  be removed.   DISCHARGE MEDICATIONS:  1. Norvasc 5 mg daily was added due to problems with elevated high      blood pressure.  2. Aspirin 81 mg daily.  3. Sinemet 25.100 one 4 times daily.  4. Comptan was added 200 mg 1 four times daily.  5. Hydrochlorothiazide 25 mg daily.  6. Sliding-scale insulin.  7. Lamictal 150 mg twice daily.  8. Lithium 300 mg twice daily.  9. Metformin has been on hold taking 500 mg twice daily.  10.Potassium 20 mEq daily.  11.Seroquel 25 mg at night.  12.Altace 5 mg b.i.d.  13.Zoloft 100 mg daily.  14.Flomax 0.4 mg at night.  15.Ambien 5 mg at night if needed.   The patient will follow with [facility] within 2-3  months following discharge.  Again, he will need a Urology evaluation as  well.  At the time of the discharge, the patient is alert, cooperative.  Agitation is much better.  Patient has orientation to person and place,  knows the year and month.  The patient still was nonambulatory.      Marlan Palau, M.D.  Electronically Signed     CKW/MEDQ  D:  05/03/2009  T:  05/03/2009  Job:  [id number]   cc:   [facility]

## 2011-05-02 NOTE — H&P (Signed)
NAME:  RACHEL, SAMPLES NO.:  [id number]   MEDICAL RECORD NO.:  [id number]          PATIENT TYPE:  INP   LOCATION:  3038                         FACILITY:  [facility]   PHYSICIAN:  Marlan Palau, M.D.  DATE OF BIRTH:  07-Feb-1935   DATE OF ADMISSION:  04/23/2009  DATE OF DISCHARGE:                              HISTORY & PHYSICAL   HISTORY OF PRESENT ILLNESS:  Zacchary Pompei is a 76 year old right-handed  white male with a history of Parkinson disease.  The patient has a  history of bipolar disorder as well but has had a rapid decline in  functional level over the last several weeks.  The patient has gotten to  the point where he is confused, hallucinating, agitated at times with  decreased p.o. intake of food and fluids.  The patient fortunately has  not sustained significant injury with any of his falls.  The patient  comes in to the hospital for management of this change in his functional  level.   PAST MEDICAL HISTORY:  Significant for:  1. History of Parkinson disease.  2. Gait disorder.  3. Organic brain syndrome.  4. Bipolar disorder.  5. Diabetes.  6. Hypertension.  7. Dyslipidemia.  8. Total hip replacement on the left.  9. Left shoulder arthritis.  10.Benign prostatic hypertrophy.   Medications at this time include:  1. Lamictal 1 tablet twice daily of 150 mg tablets.  2. Metformin 500 mg twice daily.  3. Hydrochlorothiazide 25 mg daily.  4. Temazepam 30 mg at night if needed for sleep.  5. Ramipril 5 mg one twice daily.  6. Lithium carbonate 300 mg 1 in the morning and 2 in the evening.  7. Zoloft 100 mg daily.  8. Aspirin 81 mg daily.  9. Fish oil tablets daily.  10.Sinemet 25/100 one 3 times daily.  11.Flomax 0.4 mg 1 at night.   The patient has no known allergies.   He does not smoke, drinks a glass of wine daily.   FAMILY MEDICAL HISTORY:  Notable that father passed away due to suicide.  Mother passed away with cancer, heart disease.  The  patient is a no  brothers or sisters.  The patient has one half-brother, who is alive and  well.   REVIEW OF SYSTEMS:  Notable for no recent fevers or chills.  The patient  denies headache, neck pain, back pain.  He does have some decreased p.o.  intake of food and fluids.  Denies shortness of breath, chest pains,  abdominal pains, nausea, or vomiting.  The patient does note some  constipation.  Denies any new numbness or weakness on the arms or legs.   PHYSICAL EXAMINATION:  VITAL SIGNS:  Blood pressure is 144/70, heart  rate 52, respiratory rate 16, temperature afebrile.  GENERAL:  The patient is a fairly well-developed white male, who is  confused but alert at the time of examination.  HEENT:  Head is atraumatic.  Eyes, pupils are round and reactive to  light.  Disks are flat bilaterally.  NECK:  Supple.  No carotid bruits noted.  RESPIRATORY:  Clear.  CARDIOVASCULAR:  Regular rate and rhythm.  No obvious murmurs or rubs  noted.  EXTREMITIES:  Without significant edema.  ABDOMEN:  Positive bowel sounds.  No organomegaly or tenderness noted.  NEUROLOGIC:  Cranial nerves as above.  Facial symmetry is present.  The  patient has some decrease in superior gaze.  Extraocular movements  otherwise are full.  Visual fields are full.  Speech is slightly  dysarthric.  The patient has good pinprick sensation on the face  bilaterally.  He has good strength of the facial muscle and muscles of  the head turn and shoulder shrug bilaterally.  No definite aphasia is  noted.  Motor testing reveals fairly good strength at all fours, but the  patient has apraxia with the use of the lower extremities making it  difficult to test.  Some increased tone in the extremities, particularly  lower extremity is noted.  The patient has fair finger-nose-finger  bilaterally.  Decreased range of movement across the left shoulder is  noted.  The patient has inability to perform heel-to-shin well due to  apraxia.   The patient could not be ambulated.  Deep tendon reflexes are  symmetric.  Toes are neutral bilaterally.  The patient notes good  pinprick, soft touch, vibratory sensation throughout.  No drift is seen  with upper extremities.   Laboratory values are pending at this time.   IMPRESSION:  1. Parkinson disease.  2. Bipolar disorder.  3. Decrease in functional level.   The patient will have readjustment of medications at this time.  The  patient will go on Sinemet 25/100 one 3 times daily.  We will go up on  this in the future.  The patient will have physical and occupational  therapy and have case manager involvement for possible extended care  facility placement.  We will give Seroquel at night for sleep and check  admission blood work to include a lithium level.  CBG is for blood  sugars.  The patient will be followed clinically while in-house.      Marlan Palau, M.D.  Electronically Signed     CKW/MEDQ  D:  04/23/2009  T:  04/24/2009  Job:  [id number]   cc:   [facility]

## 2011-05-02 NOTE — Op Note (Signed)
NAME:  [name], [name] NO.:  [id number]   MEDICAL RECORD NO.:  [id number]          PATIENT TYPE:  AMB   LOCATION:  [facility]                         FACILITY:  [facility]   PHYSICIAN:  Martina Sinner, MD DATE OF BIRTH:  February 24, 1935   DATE OF PROCEDURE:  04/19/2009  DATE OF DISCHARGE:                               OPERATIVE REPORT   PREOPERATIVE DIAGNOSIS:  Neurogenic bladder, refractory urge  incontinence.   POSTOPERATIVE DIAGNOSES:  1. Neurogenic bladder, refractory urge incontinence.  2. Mild meatal stenosis.   SURGERIES:  1. Cystoscopy.  2. Injection of Botox.  3. Urethral dilation.    [name] is a patient I know very well with the above  diagnosis.  He was prepped and draped in the usual fashion, was given  preoperative antibiotics.  His incontinence is significantly affecting  his quality of life.  He had mild meatal stenosis, not allowing me to  insert the ACMI scope even using a well lubricated scope and obturator.  I, therefore, used male sounds to dilate him gently to 26-French.  The  scope was easily inserted through the penile bulbar membranous urethra.  He had bilobar enlargement of the prostatic urethra with 2.5 cm to 3 cm  of prostatic urethral length.  He had a large capacious bladder with  grade 2/4 bladder trabeculation.  Trigone was identified.  He had  minimal bleeding at the bladder neck.  I injected 150 units of Botox  instilled in 15 mL of normal saline with my usual template at 5 and 7  o'clock and cephalad to the interureteric ridge.  There was no bleeding.  Bladder was emptied.  The patient was taken to recovery room.           ______________________________  Martina Sinner, MD  Electronically Signed     SAM/MEDQ  D:  04/19/2009  T:  04/19/2009  Job:  [id number]

## 2011-05-02 NOTE — Discharge Summary (Signed)
[name], GEIER NO.:  [id number]   MEDICAL RECORD NO.:  [id number]          PATIENT TYPE:  IPS   LOCATION:  4003                         FACILITY:  [facility]   PHYSICIAN:  Ranelle Oyster, M.D.DATE OF BIRTH:  1935-10-11   DATE OF ADMISSION:  05/25/2008  DATE OF DISCHARGE:  06/02/2008                               DISCHARGE SUMMARY   DISCHARGE DIAGNOSES:  1. Right total hip arthroplasty, May 21, 2008 secondary to      osteoarthritis.  2. History of left total hip replacement in 2007.  3. Postoperative anemia.  4. Parkinson disease.  5. Hypertension.  6. Depression with obsessive-compulsive disorder.  7. Non-insulin-dependent diabetes mellitus.  8. Benign prostatic hypertrophy.   HISTORY:  This is a 76 year old white male with history of a left total  hip replacement in 2007, Parkinson disease who was admitted on May 21, 2008 with end-stage changes of the right hip and no relief with  conservative care.  He underwent a right total hip arthroplasty on June  4 per Dr. Cleophas Dunker.  Coumadin for deep vein thrombosis prophylaxis, 50%  partial weightbearing which was advanced to weightbearing as tolerated.  Postoperative anemia 8.5, transfused 2 units of packed red blood cells,  bouts of confusion and monitored with limited narcotics.   PAST MEDICAL HISTORY:  See discharge diagnoses.   HABITS:  No tobacco.  Occasional alcohol.   ALLERGIES:  None.   SOCIAL HISTORY:  Lives with his wife.  Wife can assist as needed.  No  other local family.  Two-level home, bedroom downstairs, 2 steps to  entry.   MEDICATIONS PRIOR TO ADMISSION:  Restoril, Sinemet, lithium,  hydrochlorothiazide, Flomax, Altace, Zoloft, aspirin, Proscar, Lamictal,  and Fortamet.   REHABILITATION HOSPITAL COURSE:  The patient was admitted to inpatient  rehab services with therapies initiated on a 3-hour daily basis  consisting of physical therapy, occupational therapy, and rehabilitation  nursing.  The following issues were addressed during the patient's  rehabilitation stay.  Pertaining to Mr. Karner right total hip  arthroplasty on May 21, 2008 secondary osteoarthritis, staples were  intact.  Weightbearing as tolerated with hip precautions.  He did have  some mild ecchymosis which was resolving.  Postoperative anemia stable  with latest hemoglobin of 10.6, hematocrit 30.8, and he remained on iron  supplement.  History of Parkinson disease, he remained on Sinemet 3  times daily.  Blood pressures were monitored with no orthostatic changes  on hydrochlorothiazide and Altace.  He had a long history of depression  with obsessive-compulsive disorder as he continued on lithium, Zoloft,  and Lamictal.  He was followed up with behavioral health by Dr. Lina Sar.  He had a history of non-insulin-dependent diabetes mellitus,  Glucophage was initiated during his hospital stay due to some elevated  blood sugars.  Latest blood sugars of 159 and 109.  He remained on  diabetic diet.  He had a history of benign prostatic hypertrophy, on  Proscar as well as Flomax.  Overall functionally, he was ambulating  short household distances with a walker, independent upper body  activities  of daily living and minimal assist lower body.  Full family  teaching was completed with his wife.  Therapies would be ongoing as  advised per rehab services.   LABORATORY DATA:  Latest labs showed a hemoglobin 10.6, hematocrit 30.8,  platelet 290,000.  Sodium 141, potassium 3.9, BUN 7, creatinine 0.9.  Latest INR of 1.9.  He had a lithium level 0.49, as lithium would be  ongoing as prior to hospital admission.   CONDITION ON DISCHARGE:  He was discharged in stable condition.   DISCHARGE MEDICATIONS:  1. Coumadin with latest dose of 5 mg to be completed on June 20, 2008.  2. Altace 5 mg twice daily.  3. Zoloft 100 mg daily.  4. Glucophage 500 mg daily.  5. Sinemet 25/100 three times daily.  6.  Proscar 5 mg daily.  7. Hydrochlorothiazide 25 mg daily.  8. Lamictal 150 mg twice daily.  9. Lithium as prior to hospital admission.  He was currently on 300 mg      twice daily.  This would be discussed with his wife due to his      latest lithium levels.  10.Trinsicon 1 capsule daily.  11.Multivitamin daily.  12.Flomax 0.4 mg at bedtime.  13.Oxycodone immediate release 5 mg one to two tablets every 4 hours      as needed for pain, dispense of 90 tablets.   DIET:  Diabetic diet.   SPECIAL INSTRUCTIONS:  Weightbearing as tolerated.  Home health nurse  per Genevieve Norlander with complete Coumadin protocol.  The patient was advised to  resume aspirin therapy after Coumadin completed.  He would follow up  with Dr. Cleophas Dunker orthopedic service, call for appointment and Dr.  Ludwig Clarks at [place], [place] for primary care followup.      Mariam Dollar, P.A.      Ranelle Oyster, M.D.  Electronically Signed    DA/MEDQ  D:  06/01/2008  T:  06/02/2008  Job:  [id number]   cc:   Claude Manges. Cleophas Dunker, M.D.  Lina Sar, M.D.  [name], M.D.

## 2011-05-02 NOTE — H&P (Signed)
NAME:  [name], CRAYS NO.:  [id number]   MEDICAL RECORD NO.:  [id number]          PATIENT TYPE:  IPS   LOCATION:  4003                         FACILITY:  [facility]   PHYSICIAN:  Ranelle Oyster, M.D.DATE OF BIRTH:  [birth year]   DATE OF ADMISSION:  05/25/2008  DATE OF DISCHARGE:                              HISTORY & PHYSICAL   CHIEF COMPLAINTS:  Right hip pain and end-stage arthritis.   HISTORY OF PRESENT ILLNESS:  This is a 76 year old white male with  Parkinson disease who had a left total hip replacement in 2007.  After  failing conservative measures, he was admitted on May 21, 2008, for a  right total hip replacement, performed by Dr. Cleophas Dunker.  Dr. Cleophas Dunker  consulted rehab as he thought he could potentially benefit from  inpatient program where he could be monitored and pushed.  Rehab saw the  patient and felt he could benefit from an interdisciplinary plan of care  as well.   The patient has been on Coumadin for DVT prophylaxis.  He has been  partial weightbearing on the right leg initially, but was increased to  weightbearing as tolerated.  The patient developed postoperative anemia,  complained of weakness and fatigue and was transfused 1 unit today of  blood prior to coming to rehab and is receiving his second unit now on  admission.  The patient had some bouts of confusion and felt to be  secondary to medications.   REVIEW OF SYSTEMS:  Notable for depression and OCD.   PAST MEDICAL HISTORY:  Positive for Parkinson disease, depression/OCD,  BPH, hypertension, diabetes, and laser eye surgery.   He occasionally drinks and does not smoke.   FAMILY HISTORY:  Positive for CAD.   SOCIAL HISTORY:  The patient lives with his wife who can assist him.  He  has no other local family.  They have a 2-level house with bedroom,  downstairs, and 2 steps to enter.  The patient used a cane prior to  arrival.  Currently, he has mod assist for gait 18 feet.   MEDICATIONS AT HOME:  1. Restoril 30 mg nightly.  2. Sinemet 25/100 t.i.d.  3. Lithium 300 mg b.i.d.  4. Hydrochlorothiazide 25 mg daily.  5. Flomax 0.4 mg daily.  6. Altace 5 mg b.i.d.  7. Zoloft 100 mg daily.  8. Aspirin 81 mg daily.  9. Proscar 5 mg daily.  10.Lamictal 150 mg b.i.d.  11.Fortamet ER 500 mg daily.   LABS:  Hemoglobin 8.5, white count 8.4, and platelets 258,000.  Sodium  139, potassium 3.9, BUN and creatinine 24 and 1.15, and INR 1.9.   PHYSICAL EXAMINATION:  VITAL SIGNS:  Blood pressure is 130/72, pulse 54,  respiratory rate 20, and temperature 96.8.  GENERAL:  The patient is generally pleasant.  He has a masked facies  appearance.  HEENT:  Pupils equal, round, and reactive to light.  Ear, nose, and  throat exam essentially unremarkable.  Fair dentition.  NECK:  Supple without JVD or lymphadenopathy.  CHEST:  Clear to auscultation bilaterally without wheezes, rales, or  rhonchi.  HEART:  Regular rate and rhythm without murmurs, rubs, or gallops.  EXTREMITIES:  No clubbing or cyanosis, only trace edema in the right  side.  ABDOMEN:  Soft and nontender.  Bowel sounds are positive.  NEURO:  Cranial nerves II-XII are grossly intact.  Reflexes are 1+.  Sensation is grossly intact.  He is slow to move with bradykinesias  noted.  No obvious tremor or rigidity is appreciated.  He is alert, more  so than our last encounter, although he still lacks some insight and  awareness and has some difficulty with recall.  Strength is generally  4+/5 in the upper extremities.  Left lower extremities 2+/5 proximal to  4/5 distally.  The right lower extremity is 1+/5 proximal to 4/5  distally.  Wound is clean and intact with minimal serosanguineous  discharge at the right hip.   ASSESSMENT AND PLAN:  1. Functional deficit secondary to osteoarthritis of the right hip,      status post right total hip replacement.  The patient with a      history of Parkinson disease complicating  his gait picture.  The      patient is admitted to the inpatient rehab unit today to receive      interdisciplinary collaborative care, which cannot be provided at a      lesser intensity of service.  Care will consist of a 24-hour      physiatrist coverage of the unit to monitor medical conditions      listed below and coordinate care with therapy.  A 24-hour rehab      nursing will be involved in management of the patient's skin care,      wound, bowel, and bladder issues as well as close monitoring of the      patient's cognitive and mental status.  PT will assess the patient      and treat for range of motion, balance, appropriate transfer, gait,      and equipment as needed.  We will have to take an account the      patient's bradykinesias and premorbid gait deficits.  OT will      assess and treat for range of motion, ADLs, cognitive/perceptual      training and safety as well as family education.  Rehab case      manager/social worker will assess for psychosocial needs and      discharge planning.  Estimated length of stay is 7-10 days.  Goals      are supervision to modified independent.  Prognosis fair to good.  2. Postoperative anemia:  Observe after transfusion of second packed      unit of red blood cells.  We will need to watch for the patient's      exercise tolerance and positional tolerance and therapy.      Supplement with daily iron as well.  3. Parkinson disease:  Continue Sinemet t.i.d. per home regimen.  4. Hypertension:  We will monitor regularly the patient's blood      pressure on HCTZ and Altace.  We will need to observe the patient      getting appropriate fluid intake to count for his HCTZ dosing.  5. Depression/obsessive-compulsive disorder:  Continue lithium,      Zoloft, and Lamictal.  The patient has some ongoing confusion that      may be related to medications including his narcotics versus      anesthesia.  He does seem to be gradually clearing.  6.  Diabetes:  We will check regular CBGs and look for fluctuations in      sugars.  Continue Glucophage 500 mg p.o. daily for now.  7. Benign prostatic hypertrophy:  Proscar and Flomax.  The patient had      issues of emptying prior.  He is having      further difficulties with emptying since being in the hospital.  We      will check postvoid residuals and cath as appropriate.  Also,      collect UA, C&S if not done.  8. Anticoagulation with Coumadin with close monitoring of INRs to      avoid any bleeding complications.      Ranelle Oyster, M.D.  Electronically Signed     ZTS/MEDQ  D:  05/25/2008  T:  05/26/2008  Job:  [id number]

## 2011-05-05 NOTE — Discharge Summary (Signed)
NAMERYZEN, [name] NO.:  [id number]   MEDICAL RECORD NO.:  [id number]          PATIENT TYPE:  INP   LOCATION:  5019                         FACILITY:  [facility]   PHYSICIAN:  Claude Manges. Cleophas Dunker, M.D.DATE OF BIRTH:  11-07-1935   DATE OF ADMISSION:  06/26/2006  DATE OF DISCHARGE:  07/04/2006                                 DISCHARGE SUMMARY   DICTATED FOR:  Norlene Campbell, M.D.   DATE OF ADMISSION:  June 26, 2006   DATE OF DISCHARGE:  July 04, 2006   ADMISSION DIAGNOSIS:  Osteoarthritis of the left hip.   DISCHARGE DIAGNOSES:  1. Osteoarthritis of the left hip.  2. Post hemorrhagic anemia.  3. Hypertension.  4. Manic depressive disorder.  5. Obsessive compulsive disorder.  6. Benign prostatic hypertrophy.   PROCEDURE:  Left total hip arthroplasty.   HISTORY:  76 year old white male with documented osteoarthritis of the left  hip for approximately 10 years.  He is now having painful limp despite the  fact that he can bike 10-12 miles per day.  He states his pain is a moderate  ache, which is constant.  He is exhibiting some weakness with this pain.  He  denies any nighttime pain.   RADIOGRAPH:  End-stage osteoarthritis of the left hip with osteophytes  noted.  __________  conservative treatment period for left total hip  arthroplasty.   HOSPITAL COURSE:  76 year old male admitted June 26, 2006, after appropriate  laboratory studies were obtained as well as 1 gram of Ancef IV was brought  to the operating room and heparin 3000 units subcutaneous preop.  He was  taken to the operating room, where he underwent a left total hip  arthroplasty.  Tolerated the procedure well.  He was continued on Ancef 1  gram IV q. 8 hours x3 doses.  He was continued heparin 3000 units  subcutaneous q. 12 hours until his Coumadin became therapeutic.  Consultation with PT, OT and case management were made.  He was thought to  be partial weightbearing 50% on the left.  A PCA  Dilaudid pump on a reduced  dose.  He was allowed out of bed to a chair the following day.  He was  weaned to oral pain medicines and the PCA was discontinued.  His Foley was  discontinued on July 12.  Case management was consulted again; this time for  a rehab consult.  Social worker became involved also and the patient's  therapist was also contacted to be seen in the hospital because of his  postop depression.  He slowly turned the corner with his physical therapy.  There was question as to whether his wife would be able to continue to care  for him at home and we had considered discharged to a skilled nursing  facility.  Unfortunately to get the appropriate clearance because of his  obsessive compulsive disorders, he actually improved before we could get to  the point of sending him to the nursing facility.  He actually did well and  on July 18 was discharged to return back to the office in  1 week for staple  removal.  EKG was read as moderate sinus bradycardia with left ventricular  hypertrophy with QRS widening.  Left hip showed total hip replacement  without complicating features.  Severe right hip osteoarthritis.   LABORATORY DATA:  Admitted with a hemoglobin of 14.9, hematocrit 43.2%,  white count 8,600, platelets 350,000.  Discharge hemoglobin 9.8, hematocrit  29.1, white count 9.9, and platelets 5,037.  Preop ProTime 12.8, INR 0.9.  ProTime discharge 24.1, INR 2.1.   Chemistries:  Sodium 138, potassium 4.3, chloride 106, CO2 27, glucose 120,  BUN 18, creatinine 1.1, calcium 9.6, total protein 5.5, albumin 4.3, AST 15,  ALT 19, ALP 72, and total bilirubin 0.9.  Discharge:  Sodium 139, potassium  3.8, chloride 107, CO2 28, glucose 114, BUN 19, creatinine 0.8, calcium 8.7.  Urinalysis was benign for voided urine.  Blood type was 0 positive.  Antibody screen negative.   DISCHARGE INSTRUCTIONS:  There are no restrictions in his diet.  No driving  or lifting for 6 weeks.  To  practice full weightbearing on his left leg.  He  may shower.  Keep his wound clean and dry.  Change his dressing daily or  twice a day if drainage.  Given prescriptions for Percocet 5/325 1-2 tabs  every 4 hours as needed for pain, Coumadin 5 mg as directed by __________  pharmacy.  Call if increasing pain, swelling, drainage, or temperature  occurs.  Follow the hip precautions.  Follow back up with Dr. Cleophas Dunker at 2  weeks postop on July 09, 2006.  He was discharged in improved condition.      Oris Drone Petrarca, P.A.-C.      Claude Manges. Cleophas Dunker, M.D.  Electronically Signed    BDP/MEDQ  D:  08/03/2006  T:  08/03/2006  Job:  [id number]

## 2011-05-05 NOTE — Discharge Summary (Signed)
NAME:  [name], [name] NO.:  [id number]   MEDICAL RECORD NO.:  [id number]          PATIENT TYPE:  OBV   LOCATION:  3709                         FACILITY:  [facility]   PHYSICIAN:  Isidor Holts, M.D.  DATE OF BIRTH:  05-Jun-1935   DATE OF ADMISSION:  10/24/2004  DATE OF DISCHARGE:  10/26/2004                                 DISCHARGE SUMMARY   PRIMARY MEDICAL DOCTOR:  Gentry Fitz.   PRIMARY DIAGNOSIS:  Drug overdose, i.e., benzodiazepines.   SECONDARY DIAGNOSES:  1.  Hypertension.  2.  Obsessive-compulsive disorder.  3.  Depression.   DISCHARGE MEDICATIONS:  1.  Altace 5 mg p.o. daily.  2.  Hydrochlorothiazide 25 mg p.o. daily.   PROCEDURES:  1.  Chest x-ray, portable, dated October 24, 2004.  This showed      cardiomegaly, without acute abnormality.  There was also some ectasia      and uncoiling of the aorta, and old healed rib fractures noted on the      left.  2.  Noncontrast head computed tomography scan, dated October 24, 2004.  This      showed no radiographic evidence of an acute intracranial abnormality.   CONSULTATIONS:  Dr. Antonietta Breach, psychiatry.   ADMISSION HISTORY:  Essentially as per H&P note of October 24, 2004, but in  brief this is a 76 year old Caucasian male with a longstanding history of  depression and obsessive-compulsive disorder, who was brought to the  emergency department by EMS after being found lethargic and minimally  responsive by his wife.  It turned out that he had overdosed on temazepam,  unknown quantity.  Subsequent drug screen showed salicylates of less than 4,  acetaminophen less than 10, and benzodiazepines were positive.  The rest of  the drug screen was otherwise negative.  He was admitted for further  evaluation, investigation and management.   CLINICAL COURSE:  1.  Benzodiazepine overdose.  The patient was kept n.p.o. until fully awake      and alert.  In the interim, he was maintained with intravenous  fluids.      By October 25, 2004 a.m., he was alert and able to eat without any      problems at all.  On detailed questioning at that time, he admitted to      having impulsively determined that he was going to kill himself.  He      denied any prior planning.  Was unable to give a clear reason, however,      for his suicide attempt.  Certainly showed no residual effects from his      overdose.  Had no respiratory difficulties.  Physical examination was      quite unremarkable.  Intravenous fluids were therefore discontinued.      Psychiatric consultation was called, which was kindly provided by Dr.      Antonietta Breach.   1.  Depression/obsessive-compulsive disorder/suicidal attempt.  The patient      was evaluated by Dr. Antonietta Breach, psychiatrist, who determined that      the patient would benefit from further management in  the inpatient      setting.  Arrangements are therefore being made to admit the patient to      the behavioral health unit.   1.  Hypertension.  Blood pressure was found to be mildly elevated, at      142/83 mmHg on October 25, 2004.  The patient was recommenced on Altace      5 mg p.o. daily.  By October 26, 2004, no appreciable improvement in      blood pressure.  Hydrochlorothiazide 25 mg p.o. daily was added to      medications.  The plan is to titrate antihypertensive medications until      optimal control is achieved.   DISPOSITION:  Discharged to behavioral health unit.   DIET:  Low salt.   ACTIVITY:  As tolerated.   Further disposition will be determined upon discharge from behavioral health  unit.      Chri   CO/MEDQ  D:  10/26/2004  T:  10/26/2004  Job:  [id number]   cc:   Antonietta Breach, M.D.

## 2011-05-05 NOTE — Op Note (Signed)
. [facility]  Patient:    [name], [name] Visit Number: [id number] MRN: [id number]          Service Type: DSU Location: [facility] Attending Physician:  Henrene Dodge Dictated by:   Anselm Pancoast. Zachery Dakins, M.D. Proc. Date: 03/06/02 Admit Date:  03/06/2002 Discharge Date: 03/06/2002   CC:         Lilly Cove, M.D.   Operative Report  PREOPERATIVE DIAGNOSIS:  Left inguinal hernia.  POSTOPERATIVE DIAGNOSIS:  Left inguinal hernia, indirect with a direct component.  OPERATION:  Left inguinal herniorrhaphy with mesh reinforcement.  ANESTHESIA:  Local with heavy sedation.  SURGEON:  Anselm Pancoast. Zachery Dakins, M.D.  HISTORY:  The patient is a 76 year old male who has a neighbor and a friend who approximately several months ago started noticing a bulge in the left groin.  He works out fairly vigorously on an exercise bike, etc., and this has gradually increased in size, and he saw me in the office approximately two weeks ago.  He has had a kind of a cold recently with coughing, but that appears to be subsiding, and I have recommended that we go ahead and proceed if he desires which he does.  With standing, this protrudes probably an indirect component, and I do not appreciate any weakness on the right.  DESCRIPTION OF PROCEDURE:  The patient was taken to the operative suite.  He had been given 1 g of Kefzol IV had been started, and I clipped the groin area, and then prepped him with Betadine solution, and draped him in a sterile manner.  The inguinal incision area was infiltrated with a mixture of 0.5% Marcaine and 0.25 Xylocaine with adrenalin, and the ilioinguinal nerve area was blocked with kind of a blunted 21-gauge needle, about 10 cc used for the ilioinguinal nerve at the anterior iliac crest.  Sharp dissection through the skin and subcutaneous tissue.  Two superficial veins were clamped, divided, and ligated with fine Vicryl, and then the  external oblique was opened up to the external ring.  The ilioinguinal nerve, I tried to save it.  I elevated the cord structures with a Penrose drain and you could see the indirect component plus he has a fairly significant lipoma producing down with the hernia sac.  I separated the lipoma and ligated this pedicle with a suture of 3-0 Vicryl, and then opened the hernia sac, and under direct vision did a high sac ligation in kind of the preperitoneal space area with 2-0 silk.  The hernia sac was removed and then I sort of closed the internal ring and sort of tighten it up with some two interrupted sutures of silk.  Then, the closed the floor, and kind of reinforced the floor with a kind of a modified Shouldice-type, starting at the symphysis pubis and running up to reef up the area.  Next, a piece of Prolene mesh _________ laterally with a suture starting in the symphysis pubis.  This was attached to the inguinal ligament with a running 2-0 Prolene and the superior tails or flap were sutured with interrupted sutures to the conjoined tendon and rectus area.  The two tails were sutured together laterally to kind of reinforce the internal ring and the ilioinguinal nerve came out with the cord structures.  Next, the external oblique was closed with 3-0 Vicryl, and then 3-0 Vicryl interrupted used to close the Scarpas fascia, a 5-0 Dexon subcuticular, and then Benzoin and Steri-Strips on the skin.  The patient  tolerated the procedure nicely, awakened, and was sent to the recovery room in a stable postoperative condition.  He will be released after a short stay and understands he should not drive if he is requiring any narcotics for pain. Dictated by:   Anselm Pancoast. Zachery Dakins, M.D. Attending Physician:  Henrene Dodge DD:  03/06/02 TD:  03/09/02 Job: [id number]

## 2011-05-05 NOTE — Op Note (Signed)
NAME:  [name], [name]                         ACCOUNT NO.:  [id number]   MEDICAL RECORD NO.:  [id number]                   PATIENT TYPE:  AMB   LOCATION:  ENDO                                 FACILITY:  [facility]   PHYSICIAN:  Danise Edge, M.D.                DATE OF BIRTH:  07-02-1935   DATE OF PROCEDURE:  08/25/2004  DATE OF DISCHARGE:                                 OPERATIVE REPORT   PROCEDURE:  Surveillance colonoscopy.   INDICATIONS FOR PROCEDURE:  Mr. [name] is a 76 year old male born  July 19, 1935.  [name] has undergone colonoscopic exams in the past  to remove neoplastic but noncancerous colon polyps.  He is due for a  screening colonoscopy with polypectomy to prevent colon cancer this year.   ENDOSCOPIST:  Danise Edge, M.D.   PREMEDICATION:  Versed 10 mg, Demerol 50 mg.   DESCRIPTION OF PROCEDURE:  After obtaining informed consent, [name]  was placed in the left lateral decubitus position. I administered  intravenous Demerol and intravenous Versed to achieve conscious sedation for  the procedure. The patient's blood pressure, oxygen saturation and cardiac  rhythm were monitored throughout the procedure and documented in the medical  record.   Anal inspection and digital rectal exam were normal.  The prostate was non-  nodular.  The Olympus adjustable pediatric colonoscope was introduced into  the rectum and advanced to the cecum. Colonic preparation for the exam today  was satisfactory.   RECTUM:  Normal.   SIGMOID COLON AND DESCENDING COLON:  Left colonic diverticulosis.   SPLENIC FLEXURE:  Normal.   TRANSVERSE COLON:  From the proximal transverse colon, a 1 mm sessile polyp  was removed with the cold biopsy forceps.   HEPATIC FLEXURE:  Normal.   ASCENDING COLON:  Normal.   CECUM AND ILEOCECAL VALVE:  Normal.   ASSESSMENT:  1.  Left colonic diverticulosis.  2.  A diminutive polyp was removed from the proximal transverse colon  with      the cold biopsy forceps.   RECOMMENDATIONS:  Repeat colonoscopy in five years.                                               Danise Edge, M.D.    MJ/MEDQ  D:  08/25/2004  T:  08/25/2004  Job:  [id number]   cc:   Wilson Singer, M.D.  [address]  [place]  [place] [address]  Fax: [phone]

## 2011-05-05 NOTE — Discharge Summary (Signed)
NAME:  Dwayne, Carpenter NO.:  [id number]   MEDICAL RECORD NO.:  [id number]          PATIENT TYPE:  INP   LOCATION:  5001                         FACILITY:  [facility]   PHYSICIAN:  Claude Manges. Whitfield, M.D.DATE OF BIRTH:  07/07/1935   DATE OF ADMISSION:  05/21/2008  DATE OF DISCHARGE:  05/25/2008                               DISCHARGE SUMMARY   ADMISSION DIAGNOSIS:  Osteoarthritis of the right hip.   DISCHARGE DIAGNOSES:  1. Osteoarthritis of the right hip.  2. Parkinson's.  3. Depression.  4. Status post left total hip arthroplasty.  5. Hypertension.  6. Diabetes mellitus  7. Post hemorrhagic anemia.   PROCEDURE:  Right total hip arthroplasty.   HISTORY:  [name] is a 76 year old white male with right hip  osteoarthritis for many years.  He has had a history of increasing pain  and it interferes increasingly with his actives of daily living, sleep,  and walking.  He has failed conservative treatment.  He underwent left  total hip arthroplasty in 2007, with good results.  Now wishes to  proceed with a right total hip arthroplasty, after discussion of risks  and benefits.   HOSPITAL COURSE:  A 76 year old male admitted on May 21, 2008, and after  appropriate laboratory studies were obtained as well as 1 g Ancef IV on  call to the operating room.  He was taken to the operating room where he  underwent a right total hip arthroplasty.  He tolerated the procedure  well.  He was placed on Dilaudid reduced dose PCA pump.  Ancef 1 g IV  q.6 h. for 3 doses was started.  Lovenox 40 mg subcu q.24 h. was started  on May 22, 2008, at 8 a.m.  Coumadin as per pharmacy protocol.  Foley  was placed intraoperatively.  Consult PT, OT, care management, and SNF  was also ordered.  Ambulate partial weightbearing 50% body weight.  He  was allowed out of bed to a chair the following day.  He was weaned off  his PCA and his IV was saline locked.  Dressing change was  performed on  the May 23, 2008, which revealed a healing wound without signs of  infection.  He was weaned off of his O2, keeping his sats greater than  92%.  Foley was discontinued also.  On the May 25, 2008, he was  increased to weightbearing as tolerated.  Transfused 2 units of packed  cells on May 25, 2008, with 10 mg Lasix between units.  His Lovenox was  discontinued.   An x-ray of the right hip was reordered because it was felt to be  performed postoperatively.  The remainder of his hospital course was  unremarkable and he was transferred to Rehab on May 25, 2008, to  continue with his physical and occupational therapy.  EKG of May 14, 2006, revealed marked sinus bradycardia with left axis deviation.  I  cannot rule out the anterior infarct, age indeterminate.  Diagnostic hip  revealed recent total right hip replacement satisfactory position.  Preoperative hemoglobin 14.4, hematocrit 40.8%, white count  8100, and  platelets was 365,000.  Discharge hemoglobin 8.5, hematocrit 24.4%,  white count 8400, and platelets 258,000.  Protime preop 12.90, INR 1.0,  and PTT 26.  Discharge protime 22.1 and INR 1.9.  Preop sodium 140,  potassium 4.5, chloride 108, CO2 30, glucose 91, BUN 26, and creatinine  1.16.  Discharge sodium 139, potassium 3.9, chloride 104, CO2 28,  glucose 114, BUN 24, and creatinine 1.15.  Preop GFR was greater than 60  and discharge GFR was the same.  Preop total protein 5.6, albumin 4.1,  AST 13, ALT less than 8, ALT 71, and total bilirubin 0.6.  Urinalysis  revealed small amount of leukocyte esterase, rare epitheliums, 7-10  whites, and rare bacteria.  Urine culture showed no growth.  Blood type  was O positive.  Antibody screen negative.  Transfused 2 units packed  cells during his hospital course.   DISCHARGE INSTRUCTIONS:  He will be transferred to the [facility]  where he can be weightbearing as tolerated.  Keep the incision clean and  dry, and change  dressing daily.  Continue to follow hip precautions.  Use his Coumadin as indicated.  Continue with his physical therapy and  occupational therapy.  He was discharged on a regular diet.  Discharged  in improved condition.      [name], P.A.-C.      Claude Manges. Cleophas Dunker, M.D.  Electronically Signed    BDP/MEDQ  D:  07/09/2008  T:  07/10/2008  Job:  [id number]

## 2011-05-05 NOTE — Op Note (Signed)
[name], [name] NO.:  [id number]   MEDICAL RECORD NO.:  [id number]          PATIENT TYPE:  INP   LOCATION:  2899                         FACILITY:  [facility]   PHYSICIAN:  Claude Manges. Whitfield, M.D.DATE OF BIRTH:  1935/10/07   DATE OF PROCEDURE:  06/26/2006  DATE OF DISCHARGE:                                 OPERATIVE REPORT   PREOPERATIVE DIAGNOSIS:  Osteoarthritis, left hip.   POSTOPERATIVE DIAGNOSIS:  Osteoarthritis, left hip.   PROCEDURE:  Left total hip replacement.   SURGEON:  Claude Manges. Cleophas Dunker, M.D.   ASSISTANT:  Dr. Chaney Malling and Arnoldo Morale, [facility]   ANESTHESIA:  General.   COMPLICATIONS:  None.   COMPONENTS:  DePuy AML 58 mm outer diameter Pinnacle acetabular cup 100  series with an Apex hole eliminator, the Pinnacle Marathon acetabular liner,  +4 with 10 degrees posterior lip, a small stature AML 15 mm femoral  component and a 36 mm outer diameter hip ball with a 8.5 mm neck length.  All were press fit.   PROCEDURE:  The patient comfortable on operating table and under general  orotracheal anesthesia, the nursing staff inserted a Foley catheter.  The  patient was then placed in the lateral decubitus position with the left side  up and secured with the Innomed hip system.   The left hip was then prepped with a Betadine scrub and then DuraPrep from  iliac crest to the ankle.  Sterile draping was performed.   Routine southern incision was utilized via sharp dissection carried down to  subcutaneous tissue.  The subcutaneous tissue was incised.  Gross bleeders  were Bovie coagulated. The iliotibial band was identified, incised along  length of the skin incision.  By blunt dissection the fibers of the vastus  medialis were separated.  Self-retaining retractors were inserted.  With the  hip internally rotated, the short external rotators were identified.  Tendinous structures were tagged with 0 Ethibond suture and then incised in  their attachment  of the greater trochanter posteriorly with the Bovie.  The  hip capsule was identified and incised along the femoral neck and the head  to the acetabular lip.  We could palpate the sciatic nerve deep in the wound  and protected it throughout the operative procedure.   There was abundant synovial tissue and probably 5 mL of clear yellow joint  effusion.  The head was malformed consistent with what appeared to be an old  dysplastic hip with large osteophytes.  The head was then dislocated then  osteotomized at the femoral neck with the AML calcar guide.  Retractors were  then placed about the femoral neck.  A starter hole was then made for  reaming in the piriformis fossa and reaming was performed to 14.5 mm to  accept a 15-mm prosthesis which we had templated preoperatively.  Sequential  rasping was performed with the small broaches to a 15 which fit nicely on  the calcar.  We fine-tuned the calcar position with a calcar reamer.  The  neck was cut about a fingerbreadth proximal to the lesser trochanter.   Retractor  was then placed about the acetabulum.  The acetabulum was  considerably shallow.  There were large osteophytes were which were removed  and a very large labrum which was also resected.   Reaming was then performed sequentially to 57 mm deepening the acetabulum  and checking position with each ream.  There were numerous cysts which were  removed.  We deepened the acetabulum such that when we inserted the 57 mm  trial acetabular component, it  would seat very very nicely.  Accordingly we  then impacted the Pinnacle 100 series acetabular component and fit very  nicely and was completely covered.  We then inserted the trial +4 acetabular  liner, the 15 mm broach and tried several neck and head lengths.  The 36 mm  hip ball with a 5 neck fit very nicely with no instability with flexion or  extension, internal, external rotation and leg lengths appeared to be  reestablished as the  patient was approximately half an inch short  preoperatively.   Trial components removed.  We impacted the acetabular component more deeply  and then was flush with the inner wall the acetabulum.  The Apex hole  eliminator was inserted followed by the final polyethylene Marathon liner.  The 15-mm final femoral component was then impacted flush on the calcar and  we trialed several neck lengths.  As we had deepened the acetabulum, we felt  that we probably needed a longer neck length and in fact the 8.5 mm neck  length reestablished leg lengths and was perfectly stable.  The wound was  copiously irrigated throughout the operative procedure and again before  inserting the final hip ball.  The [facility] taper neck was cleaned and dried  and then the final 36 mm hip ball with a 8.5 mm neck length was impacted.  Acetabulum was inspected without evidence of loose material.  The capsule  was removed from the acetabulum so that we could then reduce the hip without  any soft tissue interposition. Through full range of motion we had perfect  stability.   The capsule was then closed anatomically with a #1 Ethibond.  Short external  rotators were closed with similar material.  The iliotibial band was closed  with a running 0 Vicryl, subcu with 2-0 Vicryl, skin closed with skin clips.  Sterile bulky dressing was applied.  The patient was then placed supine on  the operating room stretcher.  Leg lengths appeared to be symmetrical with  excellent capillary refill of toes.   The patient was returned to the post anesthesia recovery in satisfactory  condition.      Claude Manges. Cleophas Dunker, M.D.  Electronically Signed     PWW/MEDQ  D:  06/26/2006  T:  06/26/2006  Job:  [id number]
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
## 2011-05-05 NOTE — H&P (Signed)
NAME:  [name], [name] NO.:  [id number]   MEDICAL RECORD NO.:  [id number]          PATIENT TYPE:  OBV   LOCATION:  1828                         FACILITY:  [facility]   PHYSICIAN:  Hillery Aldo, M.D.   DATE OF BIRTH:  05-Feb-1935   DATE OF ADMISSION:  10/24/2004  DATE OF DISCHARGE:                                HISTORY & PHYSICAL   CHIEF COMPLAINT:  Overdose.   HISTORY OF PRESENT ILLNESS:  The patient is a 76 year old man with a long-  standing history of depression and obsessive impulsive disorder who was  brought to the emergency department via EMS after being found lethargic and  minimally responsive by his wife earlier today.  When EMS arrived, the  patient was described as being conscious and responsive to voice with good  air exchange on his lung exam.  Apparently, the patient did admit to  overdosing on Temazepam but only admitted to taking three 15 mg tablets.  The patient admitted that the ingestion took place at about 8 o'clock this  morning.   ALLERGIES:  No known drug allergies.   MEDICATIONS:  1.  Altace 5 mg b.i.d.  2.  Effexor 225 mg daily.  3.  Hydrochlorothiazide 25 mg daily.  4.  BuSpar 5 mg b.i.d.  5.  Viagra 100 mg p.r.n.  6.  Temazepam 45 mg q.h.s. (note the last time this prescription was filled      was on October 12, 2004, for 90 tablets and there were none in the      bottle).  7.  Lorazepam 0.5 mg b.i.d. and 1 mg at h.s.  8.  Zyprexa 2.5 mg b.i.d. and 5 mg q.h.s.   PAST MEDICAL HISTORY:  1.  Hypertension.  2.  Obsessive compulsive disorder.  3.  Depression with suicidal ideation.  4.  History of left clavicle dislocation and surgical repair.   FAMILY HISTORY:  The patient's mother is deceased at age 73 secondary to  colon cancer, father is deceased at age 3 secondary to successful suicide.  The patient has no siblings.  He is estranged from his two natural born  daughters who, as far as he knows, do not have any medical  problems.   SOCIAL HISTORY:  The patient is employed and is an avid exerciser and bikes  six miles a day.  He denies any smoking, drug use, or alcohol use.   REVIEW OF SYMPTOMS:  Limited secondary to the patient's lethargy.  The  patient does admit to having been depressed recently with some suicidal  thoughts, but denies an active suicide attempt.  According to his wife, he  has had some headaches and a funny sensation in his head that she cannot  characterize, nor can he.  Denied any recent chest pain, shortness of  breath, abdominal complaints, or other symptoms.  The wife notes that he  does have quite a bit of upper airway congestion and frequent expectoration  of sputum.   PHYSICAL EXAMINATION:  VITAL SIGNS:  Temperature 97.6, pulse 51, respirations 20, blood pressure  128/70.  GENERAL:  Lethargic white male  who appears much younger than his stated age.  HEENT:  Normocephalic, atraumatic, pupils are sluggishly reactive and equal.  Extraocular movements intact although the patient could not follow commands.  Oropharynx clear.  Mucous membranes moist.  NECK:  Supple, no thyromegaly, no lymphadenopathy, no jugular venous  distention.  CHEST:  There is coarse rhonchi throughout all lung fields that clear with  coughing.  HEART:  Bradycardic but regular rhythm.  PMI nondisplaced.  ABDOMEN:  Soft, nontender, nondistended, normal active bowel sounds.  No  hepatosplenomegaly.  GU:  There is a Foley catheter in place.  EXTREMITIES:  No cyanosis, clubbing, and edema.  He does have some thickened  toenails bilaterally.  2+ pedal pulses.  SKIN:  Warm and dry, no lesions.  NEUROLOGICAL:  The patient is lethargic and unable to follow commands well.  Unable to elicit reflexes secondary to some tenseness in the muscles.  Limited neurological exam is nonfocal.  The patient spontaneously moves all  his extremities without difficulty.   LABORATORY DATA:  CT scan of the head was negative.   Chest x-ray showed  cardiomegaly but no active disease.  He did have some old rib fractures on  the left that were noted.  CBC showed a white blood cell count of 8.1,  hemoglobin 14.8, hematocrit 41.7, platelet count 339.  Chemistry showed  sodium 133, potassium 3.7, chloride 102, bicarb 29, BUN 15, creatinine 1.1,  glucose 86.  Liver function tests were all within normal limits with the  exception of a low albumin of 3.3.  Urine drug screen was positive for  benzodiazepines.  Tylenol level was less than 10.  Alcohol level less than  5.  Aspirin level less than 4.  Urinalysis was within normal limits.   ASSESSMENT:  1.  Altered mental status, this is likely secondary to an overdose of      Temazepam.  His urine drug screen is consistent with this.  His head CT      was negative to rule out any acute intracranial process.  Given his      cardiomegaly and long-standing history of hypertension, it is reasonable      to rule out an atypical presentation of an acute coronary syndrome.      Given this, I will check a 12 lead EKG and cycle cardiac enzymes      although my suspicion for cardiac disease is relatively low.  2.  Hypertension.  Will hold all his antihypertensives for now.  He is      normotensive here in the ER.  3.  History of depression.  Will institute suicide precautions and have the      patient observed by a 24 hour sitter while in the hospital.  In the      morning, we will obtain a psychiatric consultation.  I will hold all his      psychotropic medications for now.  4.  Rhonchorous sounding lungs, this is likely secondary to his diminished      mental status.  I suspect his lung fields      will clear when he is more alert and able to cough.  I will put him on      Albuterol q.6h. to help with that.  He may be at high risk for an      aspiration pneumonia and if he develops fever or other signs of     infection, he may need a course of antibiotics.  We will keep  an eye on       this.       CR/MEDQ  D:  10/24/2004  T:  10/24/2004  Job:  [id number]   cc:   Wilson Singer, M.D.  [address], [address]  [place]  [place] [address]  Fax: [phone]   Forde Radon, M.D.

## 2011-06-01 ENCOUNTER — Emergency Department (HOSPITAL_COMMUNITY)
Admission: EM | Admit: 2011-06-01 | Discharge: 2011-06-01 | Disposition: A | Discharge status: Home / Self Care | Payer: Medicare Other | Attending: Emergency Medicine | Admitting: Emergency Medicine

## 2011-06-01 DIAGNOSIS — R319 Hematuria, unspecified: Secondary | ICD-10-CM | POA: Insufficient documentation

## 2011-06-01 DIAGNOSIS — G20 Parkinson's disease: Secondary | ICD-10-CM | POA: Insufficient documentation

## 2011-06-01 DIAGNOSIS — G20A1 Parkinson's disease without dyskinesia, without mention of fluctuations: Secondary | ICD-10-CM | POA: Insufficient documentation

## 2011-06-01 DIAGNOSIS — N390 Urinary tract infection, site not specified: Secondary | ICD-10-CM | POA: Insufficient documentation

## 2011-06-01 DIAGNOSIS — E119 Type 2 diabetes mellitus without complications: Secondary | ICD-10-CM | POA: Insufficient documentation

## 2011-06-01 LAB — URINALYSIS, ROUTINE W REFLEX MICROSCOPIC
Protein, ur: 30 mg/dL — AB
Specific Gravity, Urine: 1.016 (ref 1.005–1.030)
Urobilinogen, UA: 1 mg/dL (ref 0.0–1.0)

## 2011-06-01 LAB — URINE MICROSCOPIC-ADD ON

## 2011-06-03 LAB — URINE CULTURE

## 2011-06-23 ENCOUNTER — Encounter (HOSPITAL_COMMUNITY): Payer: Medicare Other | Admitting: Psychiatry

## 2011-06-28 ENCOUNTER — Encounter (INDEPENDENT_AMBULATORY_CARE_PROVIDER_SITE_OTHER): Payer: Medicare Other | Admitting: Psychiatry

## 2011-06-28 DIAGNOSIS — F3189 Other bipolar disorder: Secondary | ICD-10-CM

## 2011-07-06 ENCOUNTER — Encounter: Payer: Self-pay | Admitting: Podiatrist

## 2011-07-06 DIAGNOSIS — E119 Type 2 diabetes mellitus without complications: Secondary | ICD-10-CM | POA: Insufficient documentation

## 2011-08-30 ENCOUNTER — Encounter (INDEPENDENT_AMBULATORY_CARE_PROVIDER_SITE_OTHER): Payer: Medicare Other | Admitting: Psychiatry

## 2011-08-30 DIAGNOSIS — F3189 Other bipolar disorder: Secondary | ICD-10-CM

## 2011-09-06 LAB — COMPREHENSIVE METABOLIC PANEL
ALT: <8
AST: 13
Alkaline Phosphatase: 71
CO2: 27
Chloride: 106
GFR calc Af Amer: >60
GFR calc non Af Amer: >60
Sodium: 139
Total Bilirubin: 0.7

## 2011-09-06 LAB — URINE MICROSCOPIC-ADD ON

## 2011-09-06 LAB — CBC
Hemoglobin: 13.5
RBC: 4.22
WBC: 6.5

## 2011-09-06 LAB — DIFFERENTIAL
Basophils Absolute: 0
Basophils Relative: 0
Eosinophils Absolute: 0.2
Eosinophils Relative: 3
Lymphs Abs: 1.4

## 2011-09-06 LAB — PROTIME-INR: Prothrombin Time: 12.8

## 2011-09-06 LAB — URINALYSIS, ROUTINE W REFLEX MICROSCOPIC
Glucose, UA: NEGATIVE
Hgb urine dipstick: NEGATIVE
Protein, ur: NEGATIVE

## 2011-09-13 ENCOUNTER — Encounter (INDEPENDENT_AMBULATORY_CARE_PROVIDER_SITE_OTHER): Payer: BLUE CROSS/BLUE SHIELD | Admitting: Psychiatry

## 2011-09-13 DIAGNOSIS — F3189 Other bipolar disorder: Secondary | ICD-10-CM

## 2011-09-13 LAB — COMPREHENSIVE METABOLIC PANEL
Albumin: 4.1
GFR calc non Af Amer: >60
Glucose, Bld: 91
Potassium: 4.5
Sodium: 140
Total Bilirubin: 0.6
Total Protein: 5.6 — ABNORMAL LOW

## 2011-09-13 LAB — CBC
Hemoglobin: 14.4
Platelets: 365
RDW: 13.9

## 2011-09-13 LAB — DIFFERENTIAL
Basophils Absolute: 0.2 — ABNORMAL HIGH
Lymphocytes Relative: 16
Lymphs Abs: 1.3
Neutro Abs: 6.1
Neutrophils Relative %: 75

## 2011-09-13 LAB — PROTIME-INR
INR: 1
Prothrombin Time: 12.9

## 2011-09-13 LAB — APTT: aPTT: 26

## 2011-09-14 LAB — BASIC METABOLIC PANEL
BUN: 21
CO2: 26
Calcium: 8.5
Calcium: 8.6
Calcium: 8.9
Creatinine, Ser: 1.06
Creatinine, Ser: 1.14
GFR calc Af Amer: >60
GFR calc Af Amer: >60
GFR calc non Af Amer: >60
GFR calc non Af Amer: >60
GFR calc non Af Amer: >60
Glucose, Bld: 114 — ABNORMAL HIGH
Glucose, Bld: 148 — ABNORMAL HIGH
Sodium: 139

## 2011-09-14 LAB — DIFFERENTIAL
Eosinophils Absolute: 0.6
Lymphocytes Relative: 12
Lymphs Abs: 1.2
Monocytes Relative: 7
Neutrophils Relative %: 74

## 2011-09-14 LAB — PROTIME-INR
INR: 1.1
INR: 1.5
INR: 1.7 — ABNORMAL HIGH
INR: 1.9 — ABNORMAL HIGH
INR: 1.9 — ABNORMAL HIGH
INR: 1.9 — ABNORMAL HIGH
INR: 1.9 — ABNORMAL HIGH
INR: 2 — ABNORMAL HIGH
INR: 2.2 — ABNORMAL HIGH
INR: 2 — ABNORMAL HIGH
Prothrombin Time: 14.4
Prothrombin Time: 18.4 — ABNORMAL HIGH
Prothrombin Time: 20.5 — ABNORMAL HIGH
Prothrombin Time: 20.7 — ABNORMAL HIGH
Prothrombin Time: 22.4 — ABNORMAL HIGH
Prothrombin Time: 22.8 — ABNORMAL HIGH
Prothrombin Time: 23.6 — ABNORMAL HIGH
Prothrombin Time: 24.9 — ABNORMAL HIGH
Prothrombin Time: 23.4 — ABNORMAL HIGH

## 2011-09-14 LAB — CROSSMATCH
ABO/RH(D): O POS
ABO/RH(D): O POS
Antibody Screen: NEGATIVE

## 2011-09-14 LAB — LITHIUM LEVEL: Lithium Lvl: 0.49 — ABNORMAL LOW

## 2011-09-14 LAB — URINE CULTURE
Colony Count: NO GROWTH
Culture: NO GROWTH

## 2011-09-14 LAB — CBC
Hemoglobin: 10.6 — ABNORMAL LOW
MCHC: 34.1
MCHC: 34.4
MCHC: 34.5
MCHC: 34.7
MCV: 91.6
Platelets: 208
Platelets: 227
Platelets: 258
Platelets: 410 — ABNORMAL HIGH
RBC: 2.76 — ABNORMAL LOW
RBC: 3.28 — ABNORMAL LOW
RDW: 13.2
RDW: 13.3
RDW: 13.5
RDW: 14.1
WBC: 10.1
WBC: 10.6 — ABNORMAL HIGH

## 2011-09-14 LAB — COMPREHENSIVE METABOLIC PANEL
ALT: 13
CO2: 27
Calcium: 8.7
Creatinine, Ser: 0.91
GFR calc Af Amer: >60
GFR calc non Af Amer: >60
Glucose, Bld: 112 — ABNORMAL HIGH
Sodium: 141
Total Protein: 4.6 — ABNORMAL LOW

## 2011-09-14 LAB — URINALYSIS, ROUTINE W REFLEX MICROSCOPIC
Bilirubin Urine: NEGATIVE
Hgb urine dipstick: NEGATIVE
Ketones, ur: NEGATIVE
Specific Gravity, Urine: 1.022
Urobilinogen, UA: 1

## 2011-09-14 LAB — URINE MICROSCOPIC-ADD ON

## 2011-09-16 ENCOUNTER — Emergency Department (HOSPITAL_COMMUNITY)
Admission: EM | Admit: 2011-09-16 | Discharge: 2011-09-17 | Disposition: A | Discharge status: Home / Self Care | Payer: Medicare Other | Attending: Emergency Medicine | Admitting: Emergency Medicine

## 2011-09-16 DIAGNOSIS — N390 Urinary tract infection, site not specified: Secondary | ICD-10-CM | POA: Insufficient documentation

## 2011-09-16 DIAGNOSIS — G20A1 Parkinson's disease without dyskinesia, without mention of fluctuations: Secondary | ICD-10-CM | POA: Insufficient documentation

## 2011-09-16 DIAGNOSIS — N489 Disorder of penis, unspecified: Secondary | ICD-10-CM | POA: Insufficient documentation

## 2011-09-16 DIAGNOSIS — Z96649 Presence of unspecified artificial hip joint: Secondary | ICD-10-CM | POA: Insufficient documentation

## 2011-09-16 DIAGNOSIS — E119 Type 2 diabetes mellitus without complications: Secondary | ICD-10-CM | POA: Insufficient documentation

## 2011-09-16 DIAGNOSIS — G20 Parkinson's disease: Secondary | ICD-10-CM | POA: Insufficient documentation

## 2011-09-16 DIAGNOSIS — I10 Essential (primary) hypertension: Secondary | ICD-10-CM | POA: Insufficient documentation

## 2011-09-17 LAB — URINALYSIS, ROUTINE W REFLEX MICROSCOPIC
Nitrite: POSITIVE — AB
Protein, ur: NEGATIVE mg/dL
Specific Gravity, Urine: 1.008 (ref 1.005–1.030)
Urobilinogen, UA: 0.2 mg/dL (ref 0.0–1.0)

## 2011-09-17 LAB — URINE MICROSCOPIC-ADD ON

## 2011-09-18 ENCOUNTER — Emergency Department (HOSPITAL_COMMUNITY)
Admission: EM | Admit: 2011-09-18 | Discharge: 2011-09-19 | Disposition: A | Discharge status: Home / Self Care | Payer: Medicare Other | Attending: Emergency Medicine | Admitting: Emergency Medicine

## 2011-09-18 DIAGNOSIS — Z79899 Other long term (current) drug therapy: Secondary | ICD-10-CM | POA: Insufficient documentation

## 2011-09-18 DIAGNOSIS — F319 Bipolar disorder, unspecified: Secondary | ICD-10-CM | POA: Insufficient documentation

## 2011-09-18 DIAGNOSIS — G20 Parkinson's disease: Secondary | ICD-10-CM | POA: Insufficient documentation

## 2011-09-18 DIAGNOSIS — N390 Urinary tract infection, site not specified: Secondary | ICD-10-CM | POA: Insufficient documentation

## 2011-09-18 DIAGNOSIS — G20A1 Parkinson's disease without dyskinesia, without mention of fluctuations: Secondary | ICD-10-CM | POA: Insufficient documentation

## 2011-09-18 DIAGNOSIS — E119 Type 2 diabetes mellitus without complications: Secondary | ICD-10-CM | POA: Insufficient documentation

## 2011-09-18 DIAGNOSIS — I10 Essential (primary) hypertension: Secondary | ICD-10-CM | POA: Insufficient documentation

## 2011-09-18 LAB — URINE CULTURE: Culture  Setup Time: 201209302116

## 2011-09-19 LAB — DIFFERENTIAL
Eosinophils Absolute: 0.4 10*3/uL (ref 0.0–0.7)
Eosinophils Relative: 3 % (ref 0–5)
Lymphocytes Relative: 18 % (ref 12–46)
Lymphs Abs: 1.9 10*3/uL (ref 0.7–4.0)
Monocytes Absolute: 0.6 10*3/uL (ref 0.1–1.0)

## 2011-09-19 LAB — BASIC METABOLIC PANEL
BUN: 23 mg/dL (ref 6–23)
Chloride: 106 mEq/L (ref 96–112)
Glucose, Bld: 77 mg/dL (ref 70–99)
Potassium: 4.4 mEq/L (ref 3.5–5.1)

## 2011-09-19 LAB — URINALYSIS, ROUTINE W REFLEX MICROSCOPIC
Bilirubin Urine: NEGATIVE
Ketones, ur: NEGATIVE mg/dL
Nitrite: POSITIVE — AB
Specific Gravity, Urine: 1.012 (ref 1.005–1.030)
Urobilinogen, UA: 0.2 mg/dL (ref 0.0–1.0)

## 2011-09-19 LAB — CBC
HCT: 38.2 % — ABNORMAL LOW (ref 39.0–52.0)
MCH: 30.4 pg (ref 26.0–34.0)
MCHC: 33 g/dL (ref 30.0–36.0)
MCV: 92 fL (ref 78.0–100.0)
RDW: 13.8 % (ref 11.5–15.5)

## 2011-09-21 ENCOUNTER — Emergency Department (HOSPITAL_COMMUNITY)
Admission: EM | Admit: 2011-09-21 | Discharge: 2011-09-21 | Disposition: A | Discharge status: Home / Self Care | Payer: Medicare Other | Attending: Emergency Medicine | Admitting: Emergency Medicine

## 2011-09-21 DIAGNOSIS — I10 Essential (primary) hypertension: Secondary | ICD-10-CM | POA: Insufficient documentation

## 2011-09-21 DIAGNOSIS — G20A1 Parkinson's disease without dyskinesia, without mention of fluctuations: Secondary | ICD-10-CM | POA: Insufficient documentation

## 2011-09-21 DIAGNOSIS — E119 Type 2 diabetes mellitus without complications: Secondary | ICD-10-CM | POA: Insufficient documentation

## 2011-09-21 DIAGNOSIS — G20 Parkinson's disease: Secondary | ICD-10-CM | POA: Insufficient documentation

## 2011-09-21 DIAGNOSIS — R300 Dysuria: Secondary | ICD-10-CM | POA: Insufficient documentation

## 2011-09-21 DIAGNOSIS — N390 Urinary tract infection, site not specified: Secondary | ICD-10-CM | POA: Insufficient documentation

## 2011-09-21 LAB — URINE CULTURE
Colony Count: >=100000
Culture  Setup Time: 201210020452

## 2011-09-21 LAB — URINE MICROSCOPIC-ADD ON

## 2011-09-21 LAB — URINALYSIS, ROUTINE W REFLEX MICROSCOPIC
Glucose, UA: NEGATIVE mg/dL
Protein, ur: NEGATIVE mg/dL
Specific Gravity, Urine: 1.019 (ref 1.005–1.030)
pH: 7.5 (ref 5.0–8.0)

## 2011-09-22 ENCOUNTER — Inpatient Hospital Stay (HOSPITAL_COMMUNITY)
Admission: EM | Admit: 2011-09-22 | Discharge: 2011-09-24 | DRG: 871 | Disposition: A | Discharge status: Home / Self Care | Payer: Medicare Other | Attending: Internal Medicine | Admitting: Internal Medicine

## 2011-09-22 DIAGNOSIS — A419 Sepsis, unspecified organism: Secondary | ICD-10-CM | POA: Diagnosis present

## 2011-09-22 DIAGNOSIS — G20A1 Parkinson's disease without dyskinesia, without mention of fluctuations: Secondary | ICD-10-CM | POA: Diagnosis present

## 2011-09-22 DIAGNOSIS — E871 Hypo-osmolality and hyponatremia: Secondary | ICD-10-CM | POA: Diagnosis present

## 2011-09-22 DIAGNOSIS — G20 Parkinson's disease: Secondary | ICD-10-CM | POA: Diagnosis present

## 2011-09-22 DIAGNOSIS — Z96649 Presence of unspecified artificial hip joint: Secondary | ICD-10-CM

## 2011-09-22 DIAGNOSIS — G92 Toxic encephalopathy: Secondary | ICD-10-CM | POA: Diagnosis present

## 2011-09-22 DIAGNOSIS — G929 Unspecified toxic encephalopathy: Secondary | ICD-10-CM | POA: Diagnosis present

## 2011-09-22 DIAGNOSIS — E119 Type 2 diabetes mellitus without complications: Secondary | ICD-10-CM | POA: Diagnosis present

## 2011-09-22 DIAGNOSIS — F319 Bipolar disorder, unspecified: Secondary | ICD-10-CM | POA: Diagnosis present

## 2011-09-22 DIAGNOSIS — N390 Urinary tract infection, site not specified: Secondary | ICD-10-CM | POA: Diagnosis present

## 2011-09-22 LAB — GLUCOSE, CAPILLARY
Glucose-Capillary: 79 mg/dL (ref 70–99)
Glucose-Capillary: 106 mg/dL — ABNORMAL HIGH (ref 70–99)

## 2011-09-22 LAB — URINE MICROSCOPIC-ADD ON

## 2011-09-22 LAB — URINALYSIS, ROUTINE W REFLEX MICROSCOPIC
Bilirubin Urine: NEGATIVE
Glucose, UA: NEGATIVE mg/dL
Specific Gravity, Urine: 1.017 (ref 1.005–1.030)
pH: 7 (ref 5.0–8.0)

## 2011-09-22 LAB — DIFFERENTIAL
Eosinophils Relative: 1 % (ref 0–5)
Lymphocytes Relative: 8 % — ABNORMAL LOW (ref 12–46)
Lymphs Abs: 1.4 10*3/uL (ref 0.7–4.0)

## 2011-09-22 LAB — CBC
HCT: 42.4 % (ref 39.0–52.0)
MCV: 92.2 fL (ref 78.0–100.0)
RBC: 4.6 MIL/uL (ref 4.22–5.81)
RDW: 13.8 % (ref 11.5–15.5)
WBC: 16.9 10*3/uL — ABNORMAL HIGH (ref 4.0–10.5)

## 2011-09-22 LAB — CARDIAC PANEL(CRET KIN+CKTOT+MB+TROPI)
CK, MB: 2.7 ng/mL (ref 0.3–4.0)
CK, MB: 2.3 ng/mL (ref 0.3–4.0)
Relative Index: INVALID (ref 0.0–2.5)
Total CK: 33 U/L (ref 7–232)
Total CK: 31 U/L (ref 7–232)
Total CK: 23 U/L (ref 7–232)

## 2011-09-22 LAB — COMPREHENSIVE METABOLIC PANEL
Albumin: 4.1 g/dL (ref 3.5–5.2)
BUN: 25 mg/dL — ABNORMAL HIGH (ref 6–23)
Creatinine, Ser: 1.1 mg/dL (ref 0.50–1.35)
Total Protein: 6.8 g/dL (ref 6.0–8.3)

## 2011-09-22 LAB — TSH: TSH: 4.018 u[IU]/mL (ref 0.350–4.500)

## 2011-09-22 LAB — POCT I-STAT TROPONIN I: Troponin i, poc: 0.07 ng/mL (ref 0.00–0.08)

## 2011-09-22 LAB — HEMOGLOBIN A1C: Hgb A1c MFr Bld: 5.6 % (ref <5.7)

## 2011-09-22 LAB — PROCALCITONIN: Procalcitonin: <0.1 ng/mL

## 2011-09-23 LAB — CBC
HCT: 36.1 % — ABNORMAL LOW (ref 39.0–52.0)
MCH: 30.4 pg (ref 26.0–34.0)
MCV: 92.3 fL (ref 78.0–100.0)
Platelets: 408 10*3/uL — ABNORMAL HIGH (ref 150–400)
RBC: 3.91 MIL/uL — ABNORMAL LOW (ref 4.22–5.81)

## 2011-09-23 LAB — DIFFERENTIAL
Eosinophils Absolute: 0.3 10*3/uL (ref 0.0–0.7)
Eosinophils Relative: 3 % (ref 0–5)
Lymphocytes Relative: 12 % (ref 12–46)
Lymphs Abs: 1.3 10*3/uL (ref 0.7–4.0)
Monocytes Relative: 6 % (ref 3–12)
Neutrophils Relative %: 80 % — ABNORMAL HIGH (ref 43–77)

## 2011-09-23 LAB — BASIC METABOLIC PANEL
BUN: 19 mg/dL (ref 6–23)
CO2: 23 mEq/L (ref 19–32)
Chloride: 107 mEq/L (ref 96–112)
Creatinine, Ser: 0.97 mg/dL (ref 0.50–1.35)
Potassium: 3.8 mEq/L (ref 3.5–5.1)

## 2011-09-23 LAB — URINE CULTURE
Colony Count: NO GROWTH
Culture  Setup Time: 201210051030
Culture: NO GROWTH

## 2011-09-23 LAB — GLUCOSE, CAPILLARY

## 2011-09-24 LAB — CBC
HCT: 35.7 % — ABNORMAL LOW (ref 39.0–52.0)
Hemoglobin: 12 g/dL — ABNORMAL LOW (ref 13.0–17.0)
MCV: 91.1 fL (ref 78.0–100.0)
RBC: 3.92 MIL/uL — ABNORMAL LOW (ref 4.22–5.81)
WBC: 10.2 10*3/uL (ref 4.0–10.5)

## 2011-09-24 LAB — BASIC METABOLIC PANEL
BUN: 16 mg/dL (ref 6–23)
CO2: 23 mEq/L (ref 19–32)
Chloride: 108 mEq/L (ref 96–112)
Creatinine, Ser: 0.94 mg/dL (ref 0.50–1.35)
GFR calc Af Amer: >90 mL/min (ref >90)
Glucose, Bld: 104 mg/dL — ABNORMAL HIGH (ref 70–99)
Potassium: 3.9 mEq/L (ref 3.5–5.1)

## 2011-09-28 LAB — CULTURE, BLOOD (ROUTINE X 2)
Culture  Setup Time: 201210051049
Culture  Setup Time: 201210051049
Culture: NO GROWTH

## 2011-10-04 NOTE — H&P (Signed)
NAME:  [name], [name] NO.:  [id number]  MEDICAL RECORD NO.:  [id number]  LOCATION:  WLED                         FACILITY:  [facility]  PHYSICIAN:  Houston Siren, MD           DATE OF BIRTH:  10/05/1935  DATE OF ADMISSION:  09/22/2011 DATE OF DISCHARGE:                             HISTORY & PHYSICAL   PRIMARY CARE PHYSICIAN:  [name], MD  REASON FOR ADMISSION:  UTI, early sepsis.  ADVANCE DIRECTIVE:  Full code.  HISTORY OF PRESENT ILLNESS:  This is a 76 year old male with history of Parkinson disease, hypertension, diabetes, status post hip replacement with frequent UTIs, presented to the emergency room with dysuria and polyuria earlier today.  He had a Foley placed and was given Cipro. Review of his urine culture September 19, 2011, showed there was Pseudomonas aeruginosa.  He returned to the emergency room with complaint of burning and was a bit more confused.  Evaluation in the emergency room shows lactic acid of 1.3, creatinine of 1.1, potassium of 4.1 and blood sugars of 65.  His troponin is 0.07.  He does have leukocytosis with a white count of 17,000, hemoglobin 14.4.  His blood pressure was a bit soft with systolic blood pressure of 90, and it did respond to fluid to a systolic blood pressure of 114.  His repeat UA showed too numerous to count wbc's, large leukocyte esterase.  His EKG shows nonspecific ST-T changes.  Hospitalist was asked to admit the patient because of potential early sepsis.  PAST MEDICAL HISTORY: 1. Hypertension. 2. Diabetes. 3. Bipolar disorder. 4. Parkinson's disease. 5. Status post hip replacement.  ALLERGIES:  No known drug allergies.  CURRENT MEDICATIONS:  Aspirin, lithium, temazepam, Comtan, finasteride, Seroquel, Norvasc, carbidopa and levodopa, lamotrigine, ramipril, sertraline, HCTZ, tamsulosin, metformin, lorazepam, potassium chloride, Metamucil, trazodone, and Colace.  Family history is noncontributory.  PHYSICAL  EXAM:  VITAL SIGNS:  Blood pressure 114/66, pulse of 51, respiratory rate of 18, temperature 98.5. GENERAL:  Exam shows that he is alert and conversing.  His speech is fluent.  He has masked facies.  No tremor.  Tongue is midline. NECK:  Supple. CARDIAC:  Exam revealed S1-S2 regular. LUNGS:  Clear.  No wheezes, rales, or any evidence of consolidation. ABDOMEN:  Exam is soft, nondistended, nontender.  He has a Foley placed. Urine looks clear. EXTREMITIES:  Show no edema.  Skin is warm and dry.  He is able to move all 4 extremity, minimal to no cogwheel rigidity.  OBJECTIVE FINDINGS:  Pertinent is serum sodium 133, creatinine 1.1, potassium 4.1.  Liver function tests are normal.  Lactic acid is 1.3, troponin is 0.07.  Urinalysis is positive.  White count of 17,000.  IMPRESSION:  This is a 76 year old male with Parkinson disease presents with Pseudomonas UTI and possible early sepsis.  He has been given Cipro.  We will continue Cipro and will add cefepime.  This is a double Pseudomonas coverage for him.  We will continue to give him fluid.  As soon as his medications are reconciled, we will continue them.  I will make sure that he will get his lithium and Sinemet. I suspect  that he will do well with intravenous fluid.  For his diabetes, we will get his insulin sliding scale.  He is a full code and will be admitted to [facility] 4.     Houston Siren, MD     PL/MEDQ  D:  09/22/2011  T:  09/22/2011  Job:  [id number]  Electronically Signed by Houston Siren  on 10/04/2011 01:54:14 AM

## 2011-10-04 NOTE — Discharge Summary (Signed)
NAMEMarland Kitchen  JOSEY, FORCIER NO.:  [id number]  MEDICAL RECORD NO.:  [id number]  LOCATION:  1403                         FACILITY:  [facility]  PHYSICIAN:  Altha Harm, MDDATE OF BIRTH:  1935/02/28  DATE OF ADMISSION:  09/22/2011 DATE OF DISCHARGE:  09/24/2011                              DISCHARGE SUMMARY   DISCHARGE DISPOSITION:  Home with private home care services providing supervision.  FINAL DISCHARGE DIAGNOSES: 1. Pseudomonas urinary tract infection with early sepsis, that is     resolved. 2. Diabetes, type 2, well controlled:  Please note that the patient's     blood sugars were in the 120s here in the hospital, not requiring     any insulin or metformin. 3. Parkinson disease, stable. 4. Bipolar disorder. 5. Status post hip replacement. 6. Dehydration, resolved.  DISCHARGE MEDICATIONS: 1. Ciprofloxacin 250 mg p.o. b.i.d. for 4 days. 2. Amlodipine 5 mg p.o. daily, hold for systolic blood pressure less     than 140. 3. Aspirin enteric-coated 81 mg p.o. daily. 4. Sinemet 1 tablet p.o. t.i.d. 5. Comtan 200 mg p.o. q.i.d. 6. Hydrochlorothiazide 25 mg p.o. daily, hold for systolic blood     pressure less than 140. 7. Klor-Con 20 mEq p.o. daily. 8. Lamotrigine 150 mg p.o. b.i.d. 9. Lithium 300 mg p.o. b.i.d. 10.Lorazepam 0.5 mg p.o. daily. 11.Metamucil 1 packet p.o. daily. 12.Metformin 500 mg p.o. b.i.d.  Please note, discharge med rec     instructions to hold metformin for blood sugars less than 140. 13.Multivitamin 1 tablet p.o. daily. 14.Neosporin Irrigant into catheter on Thursdays and Saturdays. 15.Omeprazole 20 mg p.o. daily. 16.Ramipril 5 mg p.o. b.i.d. 17.Sertraline 100 mg p.o. daily. 18.Stool softener 1 tablet p.o. b.i.d. 19.Temazepam 30 mg p.o. q.h.s. 20.Trazodone 50 mg p.o. q.h.s.  CONSULTANTS:  None.  PROCEDURES:  None.  DIAGNOSTIC STUDIES:  None.  PERTINENT LABORATORY STUDIES:  The patient was seen in the emergency room on  October 2nd.  A urine culture done at that time showed pseudomonas sensitive both to cefepime and ciprofloxacin.  Please note that urine culture done on October 5th, which was the time when the patient was admitted, showed no growth.  Hemogram at the time of discharge, white blood cell count 10.2, hemoglobin 12, hematocrit 35.7, platelet count 360.  Sodium 139, potassium 3.9, chloride 108, bicarb 23, BUN 16, creatinine 0.94.  Blood sugars ranging from 87-121 without any use of metformin or insulin.  CHIEF COMPLAINT:  Suprapubic pain.  HISTORY OF PRESENT ILLNESS:  Please refer to the H and P by Dr. Nedra Hai for details of the HPI.  However, in speaking with the patient's ex-wife, his healthcare power of attorney, the patient apparently had been seen in the emergency room on 3 separate occasions in the days prior to admission to the hospital.  Per her report, the patient was given 2 antibiotics in the emergency room.  Please note that presently, she is unable to tell me the names of those antibiotics.  However, she states that the patient continued to experience suprapubic pain and thus she brought the patient back to the emergency room without consulting his primary care physician.  The patient's wife states that  she called his urologist, but did not receive a timely response, thus she came back to the emergency room.  Owing to the fact the patient had been to the emergency room 3 times and to the fact that he had elevated white blood cell count and clinically appeared to be approaching sepsis, the patient was admitted for further evaluation and treatment.  HOSPITAL COURSE: 1. Pseudomonas urinary tract infection.  The patient had a pseudomonas     urinary tract infection, which was diagnosed on his presentation to     the emergency room on October 2nd.  It appears that the pseudomonas     urinary tract infection was at least partially treated based on the     fact that the urine culture  at the time of his admission on the 5th     showed no growth.  However, owing to the fact that the patient had     an indwelling Foley catheter and that he did have his pseudomonas     urinary tract infection, we will treat him for 7 days with     ciprofloxacin, to which pseudomonas is present.  I have, however,     cautioned the patient and his healthcare power of attorney that due     to the fact that he has an indwelling Foley catheter, he likely has     colonization and the patient should really only be treated if he     shows symptoms of illness.  I have also explained to them that the     first line of defense would be to call his primary care physician     and his urologist rather than coming to the emergency room if in     fact this is not an emergent condition.  However, I do believe that     the patient's healthcare power of attorney felt some frustration     and the fact that the patient did not appear to be getting any     relief of his symptoms. 2. In terms of his diabetes, type 2.  The patient had been on     metformin at home and his hemoglobin A1c of 5.6 showed good control     of his diabetes.  However, his metformin was held while here in the     hospital and the patient's blood sugars ranged anywhere from the     80s to 120s without any hypoglycemic medications.  It is clear that     the patient probably eats differently here in the hospital than he     does in the convalescent setting.  I have instructed his caregiver     to check his blood sugars 3 times a day before meals for the next     few days.  If his blood sugars are consistently greater than 150,     then the metformin can be restarted once she has conferred with Dr.     Jacqulyn Bath office. 3. Parkinson disease.  The patient does have Parkinson disease and is     on therapy for this.  He was evaluated by Physical Therapy and felt     need no intervention at this time.  As a matter of fact, they     recommended  that the patient should resume his community exercise     program upon discharge. 4. Bipolar disorder.  This was stable.  The patient had a well-     adjusted  mood in the hospitalization.  He will continue on his     usual medications. 5. Hypertension.  The patient's blood pressures were on the lower side     here in the hospital in the 119s over 60s here in the hospital.     Thus parameters have been given for 2 of his medications and he is     continued on his ramipril.  In terms of the Norvasc and the     hydrochlorothiazide, the patient should check his blood pressures     prior to taking them and hold for systolic blood pressures less     than 140, that is, of course, owing to the fact that the patient     will receive his ramipril, which will also lower his blood     pressure.  Otherwise, the patient was stable during this     hospitalization. 6. Dehydration.  The patient was clinically on arrival.  He was given     IV fluids and his dehydration has resolved.  The patient is able to     maintain his volume status with oral intake.  PHYSICAL EXAMINATION:  At time of discharge, GENERAL:  The patient is stable.  The patient states he has no pain, no dizziness, and no symptoms currently. VITAL SIGNS:  The blood pressure is 119/69, respiratory rate 18, heart rate 54, temperature 97.7, O2 sats are 100% on room air.  The patient states he has no pain, no dizziness, and no symptoms currently. HEENT:  He is normocephalic, atraumatic.  Pupils equally round and reactive to light and accommodation.  Extraocular movements are intact. Oropharynx is moist.  No exudate, erythema, or lesions are noted. NECK:  His trachea is midline.  No masses, no thyromegaly, no JVD, no carotid bruit. RESPIRATORY:  He has normal respiratory effort, equal excursion bilaterally.  No wheezing or rhonchi noted.  CARDIOVASCULAR:  He has got normal S1 and S2.  No murmurs, rubs, or gallops are noted.  PMI  is nondisplaced.  No heaves or thrills on palpation. ABDOMEN:  Obese, soft, nontender, nondistended.  No masses, no hepatosplenomegaly is noted. EXTREMITIES:  Show no clubbing, cyanosis, or edema. NEUROLOGIC:  The patient shows no focal neurological deficits. PSYCHIATRIC:  The patient is alert and oriented x3.  Total time for this discharge process including face-to-face time approximately 40 minutes.  DISCHARGE INSTRUCTIONS:  Chronic indwelling Foley present on admission.  DIETARY RESTRICTIONS:  The patient should be on a diabetic, heart- healthy diet.  PHYSICAL RESTRICTIONS:  The patient should use a rolling walker as recommend by Physical Therapy and resume his prehospital status.     Altha Harm, MD     MAM/MEDQ  D:  09/24/2011  T:  09/25/2011  Job:  [id number]  cc:   Dr. Oda Cogan, MD Fax: [phone]  Electronically Signed by Marthann Schiller MD on 10/04/2011 07:42:29 AM

## 2011-10-24 ENCOUNTER — Other Ambulatory Visit (HOSPITAL_COMMUNITY): Payer: Self-pay

## 2011-10-25 ENCOUNTER — Ambulatory Visit (INDEPENDENT_AMBULATORY_CARE_PROVIDER_SITE_OTHER): Payer: BLUE CROSS/BLUE SHIELD | Admitting: Psychiatry

## 2011-10-25 DIAGNOSIS — F319 Bipolar disorder, unspecified: Secondary | ICD-10-CM

## 2011-10-25 MED ORDER — LORAZEPAM 0.5 MG PO TABS: 0.5000 mg | ORAL_TABLET | Freq: Every day | ORAL | Status: DC

## 2011-10-25 MED ORDER — TEMAZEPAM 30 MG PO CAPS: 30.0000 mg | ORAL_CAPSULE | Freq: Every evening | ORAL | Status: DC | PRN

## 2011-10-25 MED ORDER — LITHIUM CARBONATE 300 MG PO TABS: 300.0000 mg | ORAL_TABLET | Freq: Two times a day (BID) | ORAL | Status: DC

## 2011-10-25 MED ORDER — SERTRALINE HCL 100 MG PO TABS: 100.0000 mg | ORAL_TABLET | Freq: Every day | ORAL | Status: DC

## 2011-10-25 MED ORDER — LAMOTRIGINE 150 MG PO TABS: ORAL_TABLET | ORAL | Status: DC

## 2011-10-25 NOTE — Progress Notes (Signed)
Patient came today for his followup appointment with Home health aide. He likes taking Ativan 0.5 at noon time and noticed less anxious and less nervous. 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 He reported his sleep mood and anger has been under control. Recently there are no UTI or any infection noted. He has seen his primary care doctor in no medicine were added. He's been compliant with his medication cerebellar no side effects. Mental status examination. Patient is calm cooperative pleasant her speech is slurred but clear and coherent. His thought process is slowed but logical linear and goal directed. He continued to have some gait problem and need some support to talk but otherwise he feels fine. His concentration and attention were okay he has some difficulty recalling the events is alert and orient x3 denies any active or passive suicidal thinking. There are no psychotic symptoms present at this time. His insight judgment and impulse control are okay. Assessment bipolar disorder Plan we will continue his current medication which are Zoloft temazepam lithium and Lamictal. I will discontinue trazodone at this time as he does not need any more hypnotic for sleep. I have explained the risk and benefit of medication in detail I will see him again in 6 weeks.

## 2011-11-01 ENCOUNTER — Other Ambulatory Visit (HOSPITAL_COMMUNITY): Payer: Self-pay | Admitting: Psychiatry

## 2011-11-03 ENCOUNTER — Other Ambulatory Visit (HOSPITAL_COMMUNITY): Payer: Self-pay | Admitting: Psychiatry

## 2011-11-03 ENCOUNTER — Other Ambulatory Visit (HOSPITAL_COMMUNITY): Payer: Self-pay | Admitting: *Deleted

## 2011-11-03 DIAGNOSIS — F319 Bipolar disorder, unspecified: Secondary | ICD-10-CM

## 2011-11-03 MED ORDER — LAMOTRIGINE 150 MG PO TABS: 150.0000 mg | ORAL_TABLET | Freq: Two times a day (BID) | ORAL | Status: DC

## 2011-11-04 ENCOUNTER — Encounter: Payer: Self-pay | Admitting: *Deleted

## 2011-11-04 ENCOUNTER — Emergency Department (HOSPITAL_COMMUNITY)
Admission: EM | Admit: 2011-11-04 | Discharge: 2011-11-04 | Disposition: A | Discharge status: Home / Self Care | Payer: Medicare Other | Attending: Emergency Medicine | Admitting: Emergency Medicine

## 2011-11-04 DIAGNOSIS — Y846 Urinary catheterization as the cause of abnormal reaction of the patient, or of later complication, without mention of misadventure at the time of the procedure: Secondary | ICD-10-CM | POA: Insufficient documentation

## 2011-11-04 DIAGNOSIS — R319 Hematuria, unspecified: Secondary | ICD-10-CM | POA: Insufficient documentation

## 2011-11-04 DIAGNOSIS — T83091A Other mechanical complication of indwelling urethral catheter, initial encounter: Secondary | ICD-10-CM

## 2011-11-04 DIAGNOSIS — Z974 Presence of external hearing-aid: Secondary | ICD-10-CM | POA: Insufficient documentation

## 2011-11-04 DIAGNOSIS — T8389XA Other specified complication of genitourinary prosthetic devices, implants and grafts, initial encounter: Secondary | ICD-10-CM | POA: Insufficient documentation

## 2011-11-04 DIAGNOSIS — N139 Obstructive and reflux uropathy, unspecified: Secondary | ICD-10-CM | POA: Insufficient documentation

## 2011-11-04 HISTORY — DX: Essential (primary) hypertension: I10

## 2011-11-04 HISTORY — DX: Presence of external hearing-aid: Z97.4

## 2011-11-04 LAB — URINALYSIS, ROUTINE W REFLEX MICROSCOPIC
Bilirubin Urine: NEGATIVE
Glucose, UA: NEGATIVE mg/dL
Ketones, ur: NEGATIVE mg/dL
Nitrite: POSITIVE — AB
Protein, ur: 30 mg/dL — AB
Specific Gravity, Urine: 1.015 (ref 1.005–1.030)
Urobilinogen, UA: 0.2 mg/dL (ref 0.0–1.0)
pH: 7 (ref 5.0–8.0)

## 2011-11-04 LAB — URINE MICROSCOPIC-ADD ON

## 2011-11-04 MED ORDER — CIPROFLOXACIN HCL 500 MG PO TABS: 500.0000 mg | ORAL_TABLET | Freq: Two times a day (BID) | ORAL | Status: AC

## 2011-11-04 MED ORDER — CIPROFLOXACIN HCL 500 MG PO TABS
500.0000 mg | ORAL_TABLET | Freq: Once | ORAL | Status: AC
  Administered 2011-11-04: 500 mg via ORAL
  Filled 2011-11-04: qty 1

## 2011-11-04 NOTE — ED Notes (Signed)
Cath has not drained today, drained well during the night, pt having discomfort, pt has had a cath for about 2 yrs

## 2011-11-04 NOTE — ED Provider Notes (Signed)
History    76ym with cc of urinary retention. Minimal output since this am. Hematuria since this am. Worsening lower abdominal pain. No back pain. No fever or chills. No n/v. Pt with chronic foley, but unable to give specifics other than "can't pee." No CP or SOB.   CSN: [id number] Arrival date & time: 11/04/2011  1:55 PM   First MD Initiated Contact with Patient 11/04/11 1502      Chief Complaint  Patient presents with  . Urinary Retention    (Consider location/radiation/quality/duration/timing/severity/associated sxs/prior treatment) HPI  Past Medical History  Diagnosis Date  . Hearing aid worn   . Diabetes mellitus   . Hypertension     History reviewed. No pertinent past surgical history.  No family history on file.  History  Substance Use Topics  . Smoking status: Never Smoker   . Smokeless tobacco: Not on file  . Alcohol Use: Yes      Review of Systems   Review of symptoms negative unless otherwise noted in HPI.   Allergies  Seasonal  Home Medications   Current Outpatient Rx  Name Route Sig Dispense Refill  . LAMOTRIGINE 150 MG PO TABS  TAKE ONE TABLET BY MOUTH IN THE MORNING AND AT BEDTIME 60 tablet 0  . LITHIUM CARBONATE 300 MG PO CAPS  TAKE ONE CAPSULE BY MOUTH TWICE DAILY 60 capsule 0  . LORAZEPAM 0.5 MG PO TABS Oral Take 1 tablet (0.5 mg total) by mouth daily at 12 noon. 30 tablet 1  . SERTRALINE HCL 100 MG PO TABS Oral Take 1 tablet (100 mg total) by mouth daily. 30 tablet 1  . TEMAZEPAM 30 MG PO CAPS Oral Take 1 capsule (30 mg total) by mouth at bedtime as needed for sleep. 30 capsule 1    BP 128/110  Pulse 83  Temp(Src) 97.6 F (36.4 C) (Oral)  Resp 18  SpO2 98%  Physical Exam  Nursing note and vitals reviewed. Constitutional: He appears well-nourished. No distress.  HENT:  Head: Normocephalic and atraumatic.  Eyes: Conjunctivae are normal. Right eye exhibits no discharge. Left eye exhibits no discharge.  Neck: Neck supple.    Cardiovascular: Normal rate, regular rhythm and normal heart sounds.  Exam reveals no gallop and no friction rub.   No murmur heard. Pulmonary/Chest: Effort normal and breath sounds normal. No respiratory distress.  Abdominal: Soft.       suprapubic mass consistent with distended bladder. Tender. Soft otherwise.  Genitourinary:       Foley to leg bag. Very little urine noted in bag. Hematuria without clots.  Musculoskeletal: He exhibits no edema and no tenderness.  Neurological: He is alert.  Skin: Skin is warm and dry. He is not diaphoretic.  Psychiatric: He has a normal mood and affect. His behavior is normal. Thought content normal.    ED Course  Procedures (including critical care time)   Labs Reviewed  URINALYSIS, ROUTINE W REFLEX MICROSCOPIC   No results found.   1. Urinary obstruction   2. Hematuria   3. Complication, blocked Foley catheter       MDM  76yM with urinary retention likely 2/2 blood clots. Foley replaced with good urine flow. Pt reports relief of symptoms. Abdominal tenderness resolved. UA consistent with infection, but pt likley colonized with chronic catheter. Given hematuria though will give course of abx. Urologist Grapey. Discussed with pt need to follow-up if hematuria does not resolve with tx with abx.         Jeannett Senior  Juleen China, MD 11/06/11 2139

## 2011-11-22 ENCOUNTER — Other Ambulatory Visit (HOSPITAL_COMMUNITY): Payer: Self-pay | Admitting: Psychiatry

## 2011-11-29 ENCOUNTER — Other Ambulatory Visit (HOSPITAL_COMMUNITY): Payer: Self-pay | Admitting: Psychiatry

## 2011-11-29 DIAGNOSIS — F3189 Other bipolar disorder: Secondary | ICD-10-CM

## 2011-12-07 ENCOUNTER — Other Ambulatory Visit (HOSPITAL_COMMUNITY): Payer: Self-pay | Admitting: Psychiatry

## 2011-12-07 DIAGNOSIS — F319 Bipolar disorder, unspecified: Secondary | ICD-10-CM

## 2011-12-08 ENCOUNTER — Other Ambulatory Visit (HOSPITAL_COMMUNITY): Payer: Self-pay | Admitting: Physician Assistant

## 2011-12-08 DIAGNOSIS — F3189 Other bipolar disorder: Secondary | ICD-10-CM

## 2011-12-08 MED ORDER — LORAZEPAM 0.5 MG PO TABS: 0.5000 mg | ORAL_TABLET | Freq: Every day | ORAL | Status: DC

## 2011-12-14 ENCOUNTER — Other Ambulatory Visit (HOSPITAL_COMMUNITY): Payer: Self-pay | Admitting: *Deleted

## 2011-12-14 MED ORDER — LAMOTRIGINE 150 MG PO TABS: 150.0000 mg | ORAL_TABLET | Freq: Two times a day (BID) | ORAL | Status: DC

## 2011-12-18 ENCOUNTER — Other Ambulatory Visit (HOSPITAL_COMMUNITY): Payer: Self-pay | Admitting: Psychiatry

## 2011-12-18 ENCOUNTER — Other Ambulatory Visit (HOSPITAL_COMMUNITY): Payer: Self-pay | Admitting: *Deleted

## 2011-12-18 DIAGNOSIS — F3189 Other bipolar disorder: Secondary | ICD-10-CM

## 2011-12-18 MED ORDER — TEMAZEPAM 30 MG PO CAPS: 30.0000 mg | ORAL_CAPSULE | Freq: Every evening | ORAL | Status: DC | PRN

## 2012-01-01 ENCOUNTER — Ambulatory Visit (INDEPENDENT_AMBULATORY_CARE_PROVIDER_SITE_OTHER): Payer: Medicare Other | Admitting: Psychiatry

## 2012-01-01 DIAGNOSIS — F319 Bipolar disorder, unspecified: Secondary | ICD-10-CM

## 2012-01-01 DIAGNOSIS — F3189 Other bipolar disorder: Secondary | ICD-10-CM

## 2012-01-01 MED ORDER — TEMAZEPAM 30 MG PO CAPS: 30.0000 mg | ORAL_CAPSULE | Freq: Every evening | ORAL | Status: DC | PRN

## 2012-01-01 MED ORDER — LAMOTRIGINE 150 MG PO TABS: 150.0000 mg | ORAL_TABLET | Freq: Two times a day (BID) | ORAL | Status: DC

## 2012-01-01 MED ORDER — LITHIUM CARBONATE 300 MG PO CAPS: 300.0000 mg | ORAL_CAPSULE | Freq: Two times a day (BID) | ORAL | Status: DC

## 2012-01-01 MED ORDER — LORAZEPAM 0.5 MG PO TABS: 0.5000 mg | ORAL_TABLET | Freq: Every day | ORAL | Status: DC

## 2012-01-01 NOTE — Progress Notes (Signed)
Patient came today for his followup appointment with Home health aide. He he is very happy as he has new home health aide. Patient reported that he is doing much better less anxious and believe best in past 2 months. He likes his new home health aide. He is sleeping good and does not believe he needs trazodone which was discontinued on last visit. He is still takes Ativan 0.5 at noon time. 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 He has no more UTI. He is not taking any antibiotic. He denies any agitation anger or mood swings. He denies any side effects of medication.  Mental status examination.  Patient is pleasant and cooperative. Though he is slow in response to answer but his speech is clear and coherent. His thought process is also slow but logical linear and goal-directed. He denies any active or passive suicidal thoughts or homicidal thoughts. There no psychotic symptoms present. His attention and concentration is slow but appropriate. There no psychotic symptoms present. He's alert and oriented x3. His insight judgment and impulse control is okay.   Assessment  bipolar disorder  Plan I will continue his current medication which are Zoloft temazepam lithium and Lamictal. I will consider taking him off from lithium in future visits. I have explained the risk and benefit of medication in detail and recommended to call us if he has any question or concern about the medication or if he feel worsening of her symptoms the at I will see him again in 2 months.

## 2012-01-14 ENCOUNTER — Other Ambulatory Visit (HOSPITAL_COMMUNITY): Payer: Self-pay | Admitting: Psychiatry

## 2012-01-14 DIAGNOSIS — F319 Bipolar disorder, unspecified: Secondary | ICD-10-CM

## 2012-01-15 ENCOUNTER — Other Ambulatory Visit (HOSPITAL_COMMUNITY): Payer: Self-pay | Admitting: Psychiatry

## 2012-02-28 ENCOUNTER — Other Ambulatory Visit (HOSPITAL_COMMUNITY): Payer: Self-pay | Admitting: Psychiatry

## 2012-02-29 ENCOUNTER — Other Ambulatory Visit (HOSPITAL_COMMUNITY): Payer: Self-pay | Admitting: Psychiatry

## 2012-03-03 ENCOUNTER — Other Ambulatory Visit (HOSPITAL_COMMUNITY): Payer: Self-pay | Admitting: Psychiatry

## 2012-03-03 DIAGNOSIS — F319 Bipolar disorder, unspecified: Secondary | ICD-10-CM

## 2012-03-04 ENCOUNTER — Encounter (HOSPITAL_COMMUNITY): Payer: Self-pay | Admitting: Psychiatry

## 2012-03-04 ENCOUNTER — Ambulatory Visit (INDEPENDENT_AMBULATORY_CARE_PROVIDER_SITE_OTHER): Payer: Medicare Other | Admitting: Psychiatry

## 2012-03-04 VITALS — BP 115/67 | HR 51 | Ht 70.0 in | Wt 158.2 lb

## 2012-03-04 DIAGNOSIS — F3189 Other bipolar disorder: Secondary | ICD-10-CM

## 2012-03-04 DIAGNOSIS — F319 Bipolar disorder, unspecified: Secondary | ICD-10-CM | POA: Insufficient documentation

## 2012-03-04 MED ORDER — LORAZEPAM 0.5 MG PO TABS: 0.5000 mg | ORAL_TABLET | Freq: Every day | ORAL | Status: DC

## 2012-03-04 MED ORDER — TEMAZEPAM 30 MG PO CAPS: 30.0000 mg | ORAL_CAPSULE | Freq: Every evening | ORAL | Status: DC | PRN

## 2012-03-04 MED ORDER — LITHIUM CARBONATE 300 MG PO CAPS: 300.0000 mg | ORAL_CAPSULE | Freq: Two times a day (BID) | ORAL | Status: DC

## 2012-03-04 MED ORDER — SERTRALINE HCL 100 MG PO TABS: 100.0000 mg | ORAL_TABLET | Freq: Every day | ORAL | Status: DC

## 2012-03-04 NOTE — Progress Notes (Signed)
Chief complaint Medication management in refills of the medication  History of present illness Patient is 77 year old Caucasian divorced male who came for his followup appointment. Patient came with his home health aide. Patient is been compliant with her medication and reported no side effects. He was recently fell and sustained some bruises and scratches on his elbow. He was seen by primary care physician who started on antibiotic therapy to avoid infection has patient has diabetes. Patient is taking these medication. He has some pain on his elbow however overall he is doing much better. 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 He's been involved in workouts. He also like to attend classes there. He is happy with his new home health aide who is taking care of his medication. He does not remember very well his medication but overall compliance with his medication. As per home health aide patient is still has some frustration and irritability but overall his mood has been stable. There is no aggression or impulsive behavior. He is sleeping fairly well. Denies any agitation or aggression. He has no new UTI or bladder infection. He reported no side effects of medication. Patient does not use illegal substances, he does not it loses his benzodiazepine or ask for early refills.  Current psychiatric medication Lithium carbonate 300 mg twice a day Lamictal 150 mg twice daily Zoloft 100 mg daily Ativan 0.5 mg daily Temazepam 30 mg at bedtime  Past psychiatric history Patient has significant history of bipolar disorder. He has history of psychiatric inpatient treatment due to the compensation office bipolar illness. His last psychiatric admission was in 2005. He had history of aggression and impulsive behavior.  Family history Patient endorse father has significant depression and he committed suicide.  Alcohol and  substance use history Patient endorsed drinking wine half-glass every night but never intoxicated or denies tremors, but all symptoms or blackouts. He denies using illegal substances.  Psychosocial history Patient lives by himself. He has a home health aide 24X 7. He is going to his Parkinson group regularly.  Medical history Patient has history of diabetes mellitus, hypertension, Parkinson disease, gait instability, hearing impairment and some cognitive impairment. His primary care physician is Dr. Rudean Hitt.  Mental status examination Patient is well-groomed and well-dressed. He maintained a good eye contact. He is pleasant and cooperative. Though he is slow in response to answer but his speech is clear and coherent. His thought process is also slow but logical linear and goal-directed. He denies any active or passive suicidal thoughts or homicidal thoughts. There no psychotic symptoms present. His attention and concentration is slow but appropriate. There no psychotic symptoms present. He has some difficulty recalling 40 events however he is alert and oriented x3. His insight judgment and impulse control is okay.   Assessment  Axis I bipolar disorder, cognitive disorder NOS Axis II deferred Axis III see medical history Axis IV mild    Plan I reviewed history, psychosocial stressors, last progress note and medication. I recommend to reduce lithium however patient is very reluctant to cut down any of his psychiatric medication. He reported his been very stable on his psychiatric medication and does not want to change or reduce the dosage. I have explained risks and benefits of medication. I will continue his temazepam, Ativan, Zoloft, Lamictal and lithium. At this time patient reported no side effects of medication. I will see him again in  2 months. I recommended to call us back if he has any question or concern about the medication or if he feel worsening of the symptoms. Time spent 30 minutes.

## 2012-04-16 ENCOUNTER — Inpatient Hospital Stay (HOSPITAL_COMMUNITY)
Admission: EM | Admit: 2012-04-16 | Discharge: 2012-04-21 | DRG: 193 | Disposition: A | Discharge status: Home / Self Care | Payer: Medicare Other | Attending: Internal Medicine | Admitting: Internal Medicine

## 2012-04-16 ENCOUNTER — Other Ambulatory Visit: Payer: Self-pay

## 2012-04-16 ENCOUNTER — Emergency Department (HOSPITAL_COMMUNITY): Payer: Medicare Other

## 2012-04-16 ENCOUNTER — Encounter (HOSPITAL_COMMUNITY): Payer: Self-pay | Admitting: *Deleted

## 2012-04-16 DIAGNOSIS — D72829 Elevated white blood cell count, unspecified: Secondary | ICD-10-CM | POA: Diagnosis present

## 2012-04-16 DIAGNOSIS — Z7982 Long term (current) use of aspirin: Secondary | ICD-10-CM

## 2012-04-16 DIAGNOSIS — A419 Sepsis, unspecified organism: Secondary | ICD-10-CM

## 2012-04-16 DIAGNOSIS — N390 Urinary tract infection, site not specified: Secondary | ICD-10-CM | POA: Diagnosis present

## 2012-04-16 DIAGNOSIS — J189 Pneumonia, unspecified organism: Secondary | ICD-10-CM | POA: Diagnosis present

## 2012-04-16 DIAGNOSIS — G92 Toxic encephalopathy: Secondary | ICD-10-CM | POA: Diagnosis present

## 2012-04-16 DIAGNOSIS — F3189 Other bipolar disorder: Secondary | ICD-10-CM

## 2012-04-16 DIAGNOSIS — R6510 Systemic inflammatory response syndrome (SIRS) of non-infectious origin without acute organ dysfunction: Secondary | ICD-10-CM | POA: Diagnosis present

## 2012-04-16 DIAGNOSIS — G20A1 Parkinson's disease without dyskinesia, without mention of fluctuations: Secondary | ICD-10-CM | POA: Diagnosis present

## 2012-04-16 DIAGNOSIS — J9601 Acute respiratory failure with hypoxia: Secondary | ICD-10-CM | POA: Diagnosis present

## 2012-04-16 DIAGNOSIS — Z96659 Presence of unspecified artificial knee joint: Secondary | ICD-10-CM

## 2012-04-16 DIAGNOSIS — E119 Type 2 diabetes mellitus without complications: Secondary | ICD-10-CM | POA: Diagnosis present

## 2012-04-16 DIAGNOSIS — G20 Parkinson's disease: Secondary | ICD-10-CM | POA: Diagnosis present

## 2012-04-16 DIAGNOSIS — G929 Unspecified toxic encephalopathy: Secondary | ICD-10-CM | POA: Diagnosis present

## 2012-04-16 DIAGNOSIS — R7989 Other specified abnormal findings of blood chemistry: Secondary | ICD-10-CM | POA: Diagnosis present

## 2012-04-16 DIAGNOSIS — I10 Essential (primary) hypertension: Secondary | ICD-10-CM | POA: Diagnosis present

## 2012-04-16 DIAGNOSIS — Z79899 Other long term (current) drug therapy: Secondary | ICD-10-CM

## 2012-04-16 DIAGNOSIS — N12 Tubulo-interstitial nephritis, not specified as acute or chronic: Secondary | ICD-10-CM | POA: Diagnosis present

## 2012-04-16 DIAGNOSIS — G928 Other toxic encephalopathy: Secondary | ICD-10-CM | POA: Diagnosis present

## 2012-04-16 DIAGNOSIS — I959 Hypotension, unspecified: Secondary | ICD-10-CM | POA: Diagnosis present

## 2012-04-16 DIAGNOSIS — F319 Bipolar disorder, unspecified: Secondary | ICD-10-CM

## 2012-04-16 HISTORY — DX: Pneumonia, unspecified organism: J18.9

## 2012-04-16 HISTORY — DX: Depression, unspecified: F32.A

## 2012-04-16 HISTORY — DX: Major depressive disorder, single episode, unspecified: F32.9

## 2012-04-16 HISTORY — DX: Anxiety disorder, unspecified: F41.9

## 2012-04-16 HISTORY — DX: Myoneural disorder, unspecified: G70.9

## 2012-04-16 HISTORY — DX: Unspecified osteoarthritis, unspecified site: M19.90

## 2012-04-16 LAB — COMPREHENSIVE METABOLIC PANEL
BUN: 28 mg/dL — ABNORMAL HIGH (ref 6–23)
CO2: 23 mEq/L (ref 19–32)
Chloride: 105 mEq/L (ref 96–112)
Creatinine, Ser: 0.93 mg/dL (ref 0.50–1.35)
GFR calc non Af Amer: 80 mL/min — ABNORMAL LOW (ref >90)
Total Bilirubin: 0.4 mg/dL (ref 0.3–1.2)

## 2012-04-16 LAB — LACTIC ACID, PLASMA: Lactic Acid, Venous: 1.1 mmol/L (ref 0.5–2.2)

## 2012-04-16 LAB — DIFFERENTIAL
Lymphocytes Relative: 4 % — ABNORMAL LOW (ref 12–46)
Monocytes Absolute: 0.5 10*3/uL (ref 0.1–1.0)
Monocytes Relative: 3 % (ref 3–12)
Neutro Abs: 12.3 10*3/uL — ABNORMAL HIGH (ref 1.7–7.7)

## 2012-04-16 LAB — CARDIAC PANEL(CRET KIN+CKTOT+MB+TROPI)
CK, MB: 3 ng/mL (ref 0.3–4.0)
Relative Index: INVALID (ref 0.0–2.5)
Relative Index: INVALID (ref 0.0–2.5)
Total CK: 42 U/L (ref 7–232)
Total CK: 43 U/L (ref 7–232)
Troponin I: 0.5 ng/mL (ref <0.30)

## 2012-04-16 LAB — CREATININE, SERUM
Creatinine, Ser: 0.92 mg/dL (ref 0.50–1.35)
GFR calc Af Amer: >90 mL/min (ref >90)

## 2012-04-16 LAB — URINALYSIS, ROUTINE W REFLEX MICROSCOPIC
Bilirubin Urine: NEGATIVE
Ketones, ur: 15 mg/dL — AB
Nitrite: POSITIVE — AB
Urobilinogen, UA: 0.2 mg/dL (ref 0.0–1.0)
pH: 5.5 (ref 5.0–8.0)

## 2012-04-16 LAB — INFLUENZA PANEL BY PCR (TYPE A & B)
H1N1 flu by pcr: NOT DETECTED
Influenza A By PCR: NEGATIVE

## 2012-04-16 LAB — POCT I-STAT TROPONIN I: Troponin i, poc: 0.28 ng/mL (ref 0.00–0.08)

## 2012-04-16 LAB — CBC
HCT: 43.3 % (ref 39.0–52.0)
Hemoglobin: 14.7 g/dL (ref 13.0–17.0)
MCV: 91.6 fL (ref 78.0–100.0)
Platelets: 221 10*3/uL (ref 150–400)
RDW: 13.9 % (ref 11.5–15.5)
WBC: 13.3 10*3/uL — ABNORMAL HIGH (ref 4.0–10.5)

## 2012-04-16 LAB — PROCALCITONIN: Procalcitonin: 3.26 ng/mL

## 2012-04-16 LAB — STREP PNEUMONIAE URINARY ANTIGEN: Strep Pneumo Urinary Antigen: NEGATIVE

## 2012-04-16 LAB — GLUCOSE, CAPILLARY: Glucose-Capillary: 84 mg/dL (ref 70–99)

## 2012-04-16 LAB — PROTIME-INR: Prothrombin Time: 13.5 seconds (ref 11.6–15.2)

## 2012-04-16 LAB — URINE MICROSCOPIC-ADD ON

## 2012-04-16 LAB — EXPECTORATED SPUTUM ASSESSMENT W GRAM STAIN, RFLX TO RESP C

## 2012-04-16 MED ORDER — CHLORHEXIDINE GLUCONATE CLOTH 2 % EX PADS
6.0000 | MEDICATED_PAD | Freq: Every day | CUTANEOUS | Status: AC
  Administered 2012-04-17 – 2012-04-21 (×5): 6 via TOPICAL

## 2012-04-16 MED ORDER — INSULIN ASPART 100 UNIT/ML ~~LOC~~ SOLN
0.0000 [IU] | Freq: Three times a day (TID) | SUBCUTANEOUS | Status: DC
  Administered 2012-04-16: 3 [IU] via SUBCUTANEOUS
  Administered 2012-04-17: 1 [IU] via SUBCUTANEOUS

## 2012-04-16 MED ORDER — ADULT MULTIVITAMIN W/MINERALS CH
1.0000 | ORAL_TABLET | Freq: Every day | ORAL | Status: DC
  Administered 2012-04-17 – 2012-04-21 (×5): 1 via ORAL
  Filled 2012-04-16 (×5): qty 1

## 2012-04-16 MED ORDER — SERTRALINE HCL 100 MG PO TABS
100.0000 mg | ORAL_TABLET | Freq: Every day | ORAL | Status: DC
  Administered 2012-04-16 – 2012-04-21 (×6): 100 mg via ORAL
  Filled 2012-04-16 (×6): qty 1

## 2012-04-16 MED ORDER — VANCOMYCIN HCL IN DEXTROSE 1-5 GM/200ML-% IV SOLN
1000.0000 mg | Freq: Once | INTRAVENOUS | Status: AC
  Administered 2012-04-16: 1000 mg via INTRAVENOUS
  Filled 2012-04-16: qty 200

## 2012-04-16 MED ORDER — MUPIROCIN 2 % EX OINT
1.0000 "application " | TOPICAL_OINTMENT | Freq: Two times a day (BID) | CUTANEOUS | Status: DC
  Administered 2012-04-16 – 2012-04-21 (×8): 1 via NASAL
  Filled 2012-04-16 (×2): qty 22

## 2012-04-16 MED ORDER — LAMOTRIGINE 150 MG PO TABS
150.0000 mg | ORAL_TABLET | Freq: Two times a day (BID) | ORAL | Status: DC
  Administered 2012-04-16 – 2012-04-21 (×10): 150 mg via ORAL
  Filled 2012-04-16 (×11): qty 1

## 2012-04-16 MED ORDER — VANCOMYCIN HCL IN DEXTROSE 1-5 GM/200ML-% IV SOLN
1000.0000 mg | Freq: Two times a day (BID) | INTRAVENOUS | Status: DC
  Administered 2012-04-16 – 2012-04-21 (×10): 1000 mg via INTRAVENOUS
  Filled 2012-04-16 (×13): qty 200

## 2012-04-16 MED ORDER — ASPIRIN EC 81 MG PO TBEC
81.0000 mg | DELAYED_RELEASE_TABLET | Freq: Every day | ORAL | Status: DC
  Administered 2012-04-17 – 2012-04-21 (×5): 81 mg via ORAL
  Filled 2012-04-16 (×5): qty 1

## 2012-04-16 MED ORDER — ENOXAPARIN SODIUM 40 MG/0.4ML ~~LOC~~ SOLN
40.0000 mg | Freq: Every day | SUBCUTANEOUS | Status: DC
  Administered 2012-04-16 – 2012-04-20 (×5): 40 mg via SUBCUTANEOUS
  Filled 2012-04-16 (×6): qty 0.4

## 2012-04-16 MED ORDER — ENTACAPONE 200 MG PO TABS
200.0000 mg | ORAL_TABLET | Freq: Three times a day (TID) | ORAL | Status: DC
  Administered 2012-04-16 – 2012-04-21 (×15): 200 mg via ORAL
  Filled 2012-04-16 (×17): qty 1

## 2012-04-16 MED ORDER — SODIUM CHLORIDE 0.9 % IV BOLUS (SEPSIS)
1000.0000 mL | Freq: Once | INTRAVENOUS | Status: AC
  Administered 2012-04-16: 1000 mL via INTRAVENOUS

## 2012-04-16 MED ORDER — ONDANSETRON HCL 4 MG/2ML IJ SOLN: 4.0000 mg | Freq: Four times a day (QID) | INTRAMUSCULAR | Status: DC | PRN

## 2012-04-16 MED ORDER — INSULIN ASPART 100 UNIT/ML ~~LOC~~ SOLN: 0.0000 [IU] | Freq: Every day | SUBCUTANEOUS | Status: DC

## 2012-04-16 MED ORDER — LITHIUM CARBONATE 300 MG PO CAPS
300.0000 mg | ORAL_CAPSULE | Freq: Two times a day (BID) | ORAL | Status: DC
  Administered 2012-04-16 – 2012-04-21 (×10): 300 mg via ORAL
  Filled 2012-04-16 (×12): qty 1

## 2012-04-16 MED ORDER — SODIUM CHLORIDE 0.9 % IV SOLN
INTRAVENOUS | Status: DC
  Administered 2012-04-16: 15:00:00 via INTRAVENOUS
  Administered 2012-04-16: 125 mL via INTRAVENOUS
  Administered 2012-04-17: 15:00:00 via INTRAVENOUS
  Administered 2012-04-17: 125 mL via INTRAVENOUS
  Administered 2012-04-19 – 2012-04-21 (×2): via INTRAVENOUS

## 2012-04-16 MED ORDER — ALBUTEROL SULFATE (5 MG/ML) 0.5% IN NEBU
2.5000 mg | INHALATION_SOLUTION | Freq: Four times a day (QID) | RESPIRATORY_TRACT | Status: DC
  Administered 2012-04-16 – 2012-04-17 (×4): 2.5 mg via RESPIRATORY_TRACT
  Filled 2012-04-16 (×4): qty 0.5

## 2012-04-16 MED ORDER — PANTOPRAZOLE SODIUM 40 MG PO TBEC
40.0000 mg | DELAYED_RELEASE_TABLET | Freq: Every day | ORAL | Status: DC
  Administered 2012-04-17 – 2012-04-21 (×5): 40 mg via ORAL
  Filled 2012-04-16 (×5): qty 1

## 2012-04-16 MED ORDER — DEXTROSE 5 % IV SOLN
1.0000 g | INTRAVENOUS | Status: DC
  Administered 2012-04-16 – 2012-04-20 (×5): 1 g via INTRAVENOUS
  Filled 2012-04-16 (×6): qty 10

## 2012-04-16 MED ORDER — ACETAMINOPHEN 325 MG PO TABS
650.0000 mg | ORAL_TABLET | Freq: Once | ORAL | Status: AC
  Administered 2012-04-16: 650 mg via ORAL
  Filled 2012-04-16: qty 2

## 2012-04-16 MED ORDER — LEVOFLOXACIN IN D5W 750 MG/150ML IV SOLN
750.0000 mg | INTRAVENOUS | Status: DC
  Administered 2012-04-16 – 2012-04-20 (×5): 750 mg via INTRAVENOUS
  Filled 2012-04-16 (×6): qty 150

## 2012-04-16 MED ORDER — SODIUM CHLORIDE 0.9 % IV SOLN
1000.0000 mL | INTRAVENOUS | Status: DC
  Administered 2012-04-16: 1000 mL via INTRAVENOUS

## 2012-04-16 MED ORDER — PIPERACILLIN-TAZOBACTAM 3.375 G IVPB
3.3750 g | Freq: Once | INTRAVENOUS | Status: AC
  Administered 2012-04-16: 3.375 g via INTRAVENOUS
  Filled 2012-04-16 (×2): qty 50

## 2012-04-16 MED ORDER — ASPIRIN 81 MG PO TABS: 81.0000 mg | ORAL_TABLET | Freq: Every day | ORAL | Status: DC

## 2012-04-16 MED ORDER — SODIUM CHLORIDE 0.9 % IV SOLN
1000.0000 mL | Freq: Once | INTRAVENOUS | Status: AC
  Administered 2012-04-16: 1000 mL via INTRAVENOUS

## 2012-04-16 MED ORDER — THERA M PLUS PO TABS: 1.0000 | ORAL_TABLET | Freq: Every day | ORAL | Status: DC

## 2012-04-16 MED ORDER — LEVOFLOXACIN IN D5W 750 MG/150ML IV SOLN
750.0000 mg | INTRAVENOUS | Status: AC
  Administered 2012-04-16: 750 mg via INTRAVENOUS
  Filled 2012-04-16: qty 150

## 2012-04-16 MED ORDER — TEMAZEPAM 15 MG PO CAPS
30.0000 mg | ORAL_CAPSULE | Freq: Every evening | ORAL | Status: DC | PRN
  Administered 2012-04-16 – 2012-04-20 (×5): 30 mg via ORAL
  Filled 2012-04-16 (×5): qty 2

## 2012-04-16 MED ORDER — CARBIDOPA-LEVODOPA 25-250 MG PO TABS
1.0000 | ORAL_TABLET | Freq: Three times a day (TID) | ORAL | Status: DC
  Administered 2012-04-16 – 2012-04-21 (×15): 1 via ORAL
  Filled 2012-04-16 (×17): qty 1

## 2012-04-16 MED ORDER — ACETAMINOPHEN 325 MG PO TABS: 650.0000 mg | ORAL_TABLET | ORAL | Status: DC | PRN

## 2012-04-16 NOTE — ED Notes (Addendum)
Pt from home.  Home health nurse went in this am and found pt altered mental status.  Pts norm is usually walking around and talking.  Pt responding to verbal stimuli only.  Vitals stable-cbg 177.  Pt has a foley cath in-unsure why.  Pt unable to speak-unsure if it is due to parkinson or ALOC.  PERRLA.  Pt following commands.  Pt coughing warm to touch.  CVA screen negative-no droop or drift noted.  20ga LAC.

## 2012-04-16 NOTE — ED Notes (Signed)
Trop 0.58 critical value from lab.  Reported to MD.

## 2012-04-16 NOTE — Progress Notes (Signed)
Utilization Review Completed.[name] T4/30/2013   

## 2012-04-16 NOTE — ED Notes (Signed)
Per EMS:  Pt at 91% on RA-no home O2.  4LNC pt up to 96%.

## 2012-04-16 NOTE — Progress Notes (Signed)
Oxygen saturations dropping down 86-89% on room air .Oxygen 2lnc placed saturation gradually increased 94-96%. Will continue to monitor. me

## 2012-04-16 NOTE — ED Notes (Signed)
Attempted to call report- nurse unavailable and will call back for report.

## 2012-04-16 NOTE — H&P (Signed)
History and Physical       Hospital Admission Note Date: 04/16/2012  Patient name: [name] Medical record number: [id number] Date of birth: 1935-07-17 Age: 77 y.o. Gender: male PCP: [name], MD, MD   Chief Complaint:  Brought from home for altered mental status  HPI: Patient is a 77 year old male with history of Parkinson's, diabetes, hypertension, bipolar disorder was brought to the [facility] ED by his home health nurse for altered mental status. History was basically obtained from is a home health nurse. Patient appears to be significantly confused although alert and awake. Per the patient's nurse, patient has intermittent home health and supervision at home, he is by himself at night until she arrives at 9 AM in the morning. This morning, when she came to his house, she noticed him laying on his bed with the legs dangling down. Patient appeared to be extremely confused and was unable to provide her any history. He sounded congested with a hoarse voice, coughing, hence she called his PCP first, Dr. Ludwig Clarks to see if he can have appointment in the office. When she noticed that patient was extremely weak and unable to get up from the bed when she called EMS.. in the ED patient was noted to have UTI as well as right multifocal airspace disease pneumonia, during my encounter patient was hypotensive with lowest BP of 93/61, febrile and confused. Patient also has history of indwelling Foley catheter, changed every 3 weeks by Dr. Isabel Caprice.   Review of Systems: Unable to obtain from the patient due to his mental status   Past Medical History: Past Medical History  Diagnosis Date  . Hearing aid worn   . Diabetes mellitus   . Hypertension   . Bipolar disorder   . Parkinson disease   . Diabetes mellitus type II    Past Surgical History  Procedure Date  . Total hip arthroplasty     Medications: Prior to Admission medications     Medication Sig Start Date End Date Taking? Authorizing Provider  amLODipine (NORVASC) 5 MG tablet Take 5 mg by mouth daily.     Yes Historical Provider, MD  aspirin 81 MG tablet Take 81 mg by mouth daily.     Yes Historical Provider, MD  carbidopa-levodopa (SINEMET) 25-250 MG per tablet Take 1 tablet by mouth 3 (three) times daily.     Yes Historical Provider, MD  clotrimazole-betamethasone (LOTRISONE) cream Apply 1 application topically 2 (two) times daily.  03/01/12  Yes Historical Provider, MD  entacapone (COMTAN) 200 MG tablet Take 200 mg by mouth 3 (three) times daily.  12/15/11  Yes Historical Provider, MD  hydrochlorothiazide (HYDRODIURIL) 25 MG tablet Take 25 mg by mouth daily.     Yes Historical Provider, MD  lamoTRIgine (LAMICTAL) 150 MG tablet Take 150 mg by mouth 2 (two) times daily.   Yes Historical Provider, MD  lithium carbonate 300 MG capsule Take 1 capsule (300 mg total) by mouth 2 (two) times daily with a meal. 03/04/12  Yes Cleotis Nipper, MD  LORazepam (ATIVAN) 0.5 MG tablet Take 1 tablet (0.5 mg total) by mouth daily at 12 noon. 03/04/12 03/04/13 Yes Cleotis Nipper, MD  metFORMIN (GLUCOPHAGE) 500 MG tablet Take 500 mg by mouth 2 (two) times daily with a meal.   Yes Historical Provider, MD  Multiple Vitamins-Minerals (MULTIVITAMINS THER. W/MINERALS) TABS Take 1 tablet by mouth daily.     Yes Historical Provider, MD  omeprazole (PRILOSEC) 20 MG capsule Take 20 mg  by mouth daily.  12/15/11  Yes Historical Provider, MD  potassium chloride SA (K-DUR,KLOR-CON) 20 MEQ tablet Take 20 mEq by mouth daily.   Yes Historical Provider, MD  ramipril (ALTACE) 5 MG tablet Take 5 mg by mouth 2 (two) times daily.     Yes Historical Provider, MD  Red Yeast Rice Extract (RED YEAST RICE PO) Take 2 tablets by mouth at bedtime.     Yes Historical Provider, MD  sertraline (ZOLOFT) 100 MG tablet Take 1 tablet (100 mg total) by mouth daily. 03/04/12  Yes Cleotis Nipper, MD  temazepam (RESTORIL) 30 MG capsule  Take 1 capsule (30 mg total) by mouth at bedtime as needed for sleep. 03/04/12  Yes Cleotis Nipper, MD    Allergies:   Allergies  Allergen Reactions  . Cholestatin     Social History:  reports that he has never smoked. He does not have any smokeless tobacco history on file. He reports that he drinks alcohol. He reports that he does not use illicit drugs.  Family History: Family History  Problem Relation Age of Onset  . Depression Father     Physical Exam: Blood pressure 96/56, pulse 76, temperature 100.4 F (38 C), temperature source Other (Comment), resp. rate 19, SpO2 91.00%. General: Alert, awake, but confused, in no acute distress. HEENT: anicteric sclera, pink conjunctiva, pupils equal and reactive to light and accomodation dry mucous membranes Neck: supple, no masses or lymphadenopathy, no goiter, no bruits  Heart: Regular rate and rhythm, without murmurs, rubs or gallops. Lungs: Coarse breath sounds throughout right worse than left Clear to auscultation bilaterally, no wheezing, rales or rhonchi. Abdomen: Soft, nontender, nondistended, positive bowel sounds, no masses. Extremities: No clubbing, cyanosis or edema with positive pedal pulses. Neuro: Grossly intact, no focal neurological deficits, strength 5/5 upper and lower extremities bilaterally Psych: alert and oriented x 3, normal mood and affect Skin: no rashes or lesions, warm and dry   LABS on Admission:  Basic Metabolic Panel:  Lab 04/16/12 1610  NA 139  K 3.7  CL 105  CO2 23  GLUCOSE 156*  BUN 28*  CREATININE 0.93  CALCIUM 9.0  MG --  PHOS --   Liver Function Tests:  Lab 04/16/12 1035  AST 15  ALT 25  ALKPHOS 75  BILITOT 0.4  PROT 5.6*  ALBUMIN 3.9     Lab 04/16/12 1035  WBC 13.3*  NEUTROABS 12.3*  HGB 14.7  HCT 43.3  MCV 91.9  PLT 225    Radiological Exams on Admission: Dg Chest Portable 1 View  04/16/2012  *RADIOLOGY REPORT*  Clinical Data: Altered mental status.  PORTABLE CHEST -  1 VIEW  Comparison: Chest x-ray 09/20/2010.  Findings: Compared to the prior examination, there is interval development of patchy multifocal airspace disease throughout the right mid and lower lung, favored to reflect right lower lobe pneumonia.  No definite pleural effusions.  Pulmonary vasculature is within normal limits.  Heart size is mildly enlarged (unchanged).  Atherosclerotic calcification within the arch of the aorta.  Multiple old healed left-sided rib fractures are again noted.  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  IMPRESSION: 1.  Findings concerning for right lower lobe pneumonia.  In the setting of altered mental status, these findings could alternatively reflect a large volume aspiration.  Clinical correlation is recommended. 2.  Atherosclerosis.  Original Report Authenticated By: Florencia Reasons, M.D.    Assessment/Plan Present on Admission:  .SIRS (systemic inflammatory response  syndrome)Hypotension/Altered mental state" -  Will continue IV fluid hydration and IV antibiotics assess for improvement in patient's mental status, follow cultures and the antibiotics   .UTI (lower urinary tract infection) - Continue IV fluids, place on vancomycin. Rocephin and levofloxacin, follow cultures   .Community acquired pneumonia - Rays on Xopenex and Atrovent nebulizer treatments, placed on antibiotics   .Parkinson disease Stable, will have PT OT evaluation when medically stable   .Diabetes mellitus - Placed on sliding scale insulin, hold metformin   .Elevated troponin: Likely secondary to hypotension otherwise patient denies any chest pain or shortness of breath - Continue aspirin  (81 mg daily ), obtain 2-D echocardiogram to rule out any wall motion abnormalities   DVT prophylaxis:  PPI  CODE STATUS:  Full CODE STATUS  Further plan will depend as patient's clinical course evolves and further radiologic and laboratory data become  available.   @Time  Spent on Admission: 1 hour Arafat Cocuzza M.D. [facility] 04/16/2012, 1:18 PM

## 2012-04-16 NOTE — ED Provider Notes (Signed)
History     CSN: [id number]  Arrival date & time 04/16/12  1013   First MD Initiated Contact with Patient 04/16/12 1018      Chief Complaint  Patient presents with  . Altered Mental Status    (Consider location/radiation/quality/duration/timing/severity/associated sxs/prior treatment) HPI Comments: Patient arrives from home via EMS with decreased level of consciousness, fever and hypoxia. His home health nurse called EMS and found him confused but responding only to verbal stimuli. Patient unable to provide much history but is awake and alert and able to tell us his name. CBG 177. Patient has indwelling Foley catheter. Report a history of Parkinson's disease, bipolar disorder, diabetes and hypertension.  The history is provided by the patient and the EMS personnel.    Past Medical History  Diagnosis Date  . Hearing aid worn   . Diabetes mellitus   . Hypertension   . Bipolar disorder   . Parkinson disease   . Diabetes mellitus type II     Past Surgical History  Procedure Date  . Total hip arthroplasty     Family History  Problem Relation Age of Onset  . Depression Father     History  Substance Use Topics  . Smoking status: Never Smoker   . Smokeless tobacco: Not on file  . Alcohol Use: Yes      Review of Systems  Unable to perform ROS: Mental status change  Psychiatric/Behavioral: Positive for altered mental status.    Allergies  Cholestatin  Home Medications   No current outpatient prescriptions on file.  BP 103/56  Pulse 72  Temp(Src) 99.8 F (37.7 C) (Core (Comment))  Resp 22  Ht 5\' 10"  (1.778 m)  Wt 157 lb 13.6 oz (71.6 kg)  BMI 22.65 kg/m2  SpO2 92%  Physical Exam  Constitutional: He appears well-developed. No distress.  HENT:  Head: Normocephalic and atraumatic.  Mouth/Throat: Oropharynx is clear and moist. No oropharyngeal exudate.  Eyes: Conjunctivae and EOM are normal. Pupils are equal, round, and reactive to light.  Neck: Normal  range of motion. Neck supple.       No meningismus  Cardiovascular: Normal rate, regular rhythm and normal heart sounds.   Pulmonary/Chest: Effort normal. No respiratory distress.       Course rhonchi bilaterally  Abdominal: Soft. There is no tenderness. There is no rebound and no guarding.  Musculoskeletal: Normal range of motion. He exhibits no edema and no tenderness.  Neurological: He is alert. No cranial nerve deficit.       Moves all extremities, follows commands  Skin: Skin is warm.    ED Course  Procedures (including critical care time)  Labs Reviewed  CBC - Abnormal; Notable for the following:    WBC 13.3 (*)    All other components within normal limits  DIFFERENTIAL - Abnormal; Notable for the following:    Neutrophils Relative 93 (*)    Neutro Abs 12.3 (*)    Lymphocytes Relative 4 (*)    Lymphs Abs 0.5 (*)    All other components within normal limits  COMPREHENSIVE METABOLIC PANEL - Abnormal; Notable for the following:    Glucose, Bld 156 (*)    BUN 28 (*)    Total Protein 5.6 (*)    GFR calc non Af Amer 80 (*)    All other components within normal limits  URINALYSIS, ROUTINE W REFLEX MICROSCOPIC - Abnormal; Notable for the following:    Color, Urine AMBER (*) BIOCHEMICALS MAY BE AFFECTED BY COLOR  APPearance CLOUDY (*)    Hgb urine dipstick LARGE (*)    Ketones, ur 15 (*)    Nitrite POSITIVE (*)    Leukocytes, UA LARGE (*)    All other components within normal limits  LITHIUM LEVEL - Abnormal; Notable for the following:    Lithium Lvl 0.56 (*)    All other components within normal limits  POCT I-STAT TROPONIN I - Abnormal; Notable for the following:    Troponin i, poc 0.28 (*)    All other components within normal limits  URINE MICROSCOPIC-ADD ON - Abnormal; Notable for the following:    Bacteria, UA FEW (*) CHECKED   All other components within normal limits  CARDIAC PANEL(CRET KIN+CKTOT+MB+TROPI) - Abnormal; Notable for the following:    Troponin I  0.58 (*)    All other components within normal limits  MRSA PCR SCREENING - Abnormal; Notable for the following:    MRSA by PCR POSITIVE (*)    All other components within normal limits  CARDIAC PANEL(CRET KIN+CKTOT+MB+TROPI) - Abnormal; Notable for the following:    Troponin I 0.50 (*)    All other components within normal limits  LACTIC ACID, PLASMA  PROCALCITONIN  PROTIME-INR  INFLUENZA PANEL BY PCR  CULTURE, BLOOD (ROUTINE X 2)  CULTURE, BLOOD (ROUTINE X 2)  URINE CULTURE  CULTURE, BLOOD (ROUTINE X 2)  CULTURE, BLOOD (ROUTINE X 2)  CULTURE, EXPECTORATED SPUTUM-ASSESSMENT  GRAM STAIN  URINE CULTURE  LEGIONELLA ANTIGEN, URINE  STREP PNEUMONIAE URINARY ANTIGEN  CARDIAC PANEL(CRET KIN+CKTOT+MB+TROPI)  CBC  CREATININE, SERUM  HEMOGLOBIN A1C   Dg Chest Portable 1 View  04/16/2012  *RADIOLOGY REPORT*  Clinical Data: Altered mental status.  PORTABLE CHEST - 1 VIEW  Comparison: Chest x-ray 09/20/2010.  Findings: Compared to the prior examination, there is interval development of patchy multifocal airspace disease throughout the right mid and lower lung, favored to reflect right lower lobe pneumonia.  No definite pleural effusions.  Pulmonary vasculature is within normal limits.  Heart size is mildly enlarged (unchanged).  Atherosclerotic calcification within the arch of the aorta.  Multiple old healed left-sided rib fractures are again noted.  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  IMPRESSION: 1.  Findings concerning for right lower lobe pneumonia.  In the setting of altered mental status, these findings could alternatively reflect a large volume aspiration.  Clinical correlation is recommended. 2.  Atherosclerosis.  Original Report Authenticated By: Florencia Reasons, M.D.     1. Sepsis   2. Elevated troponin   3. Bipolar 1 disorder   4. Other bipolar disorders       MDM  Altered mental status, fever, hypoxia. Patient awake  alert, protecting airway. Follows commands. Denies pain complaints.  Infected UA despite catheter change. Right-sided infiltrate on chest x-ray. Cultures obtained and patient started on broad-spectrum antibiotics for sepsis. Lactate normal but procalcitonin elevated.  Blood pressure and heart rate remained stable.   Date: 04/16/2012  Rate: 90  Rhythm: normal sinus rhythm  QRS Axis: left  Intervals: normal  ST/T Wave abnormalities: nonspecific ST/T changes  Conduction Disutrbances:none  Narrative Interpretation:   Old EKG Reviewed: unchanged   CRITICAL CARE Performed by: Glynn Octave   Total critical care time: 40  Critical care time was exclusive of separately billable procedures and treating other patients.  Critical care was necessary to treat or prevent imminent or life-threatening deterioration.  Critical care was time spent personally by me on the following activities: development of  treatment plan with patient and/or surrogate as well as nursing, discussions with consultants, evaluation of patient's response to treatment, examination of patient, obtaining history from patient or surrogate, ordering and performing treatments and interventions, ordering and review of laboratory studies, ordering and review of radiographic studies, pulse oximetry and re-evaluation of patient's condition.    Glynn Octave, MD 04/16/12 [phone]

## 2012-04-16 NOTE — ED Notes (Signed)
Patient taken to 2605 via ED monitors with RN present

## 2012-04-16 NOTE — Progress Notes (Signed)
ANTIBIOTIC CONSULT NOTE - INITIAL  Pharmacy Consult for Vancomycin Indication: pneumonia  Allergies  Allergen Reactions  . Cholestatin     Patient Measurements (03/04/12): Height: 70" Weight: 71.8 kg  Vital Signs: Temp: 100.2 F (37.9 C) (04/30 1352) Temp src: Other (Comment) (04/30 1352) BP: 100/55 mmHg (04/30 1352) Pulse Rate: 71  (04/30 1352) Intake/Output from previous day:   Intake/Output from this shift:    Labs:  Basename 04/16/12 1035  WBC 13.3*  HGB 14.7  PLT 225  LABCREA --  CREATININE 0.93   CrCl ~69 ml/min  The CrCl is unknown because both a height and weight (above a minimum accepted value) are required for this calculation. No results found for this basename: VANCOTROUGH:2,VANCOPEAK:2,VANCORANDOM:2,GENTTROUGH:2,GENTPEAK:2,GENTRANDOM:2,TOBRATROUGH:2,TOBRAPEAK:2,TOBRARND:2,AMIKACINPEAK:2,AMIKACINTROU:2,AMIKACIN:2, in the last 72 hours   Microbiology: No results found for this or any previous visit (from the past 720 hour(s)).  Medical History: Past Medical History  Diagnosis Date  . Hearing aid worn   . Diabetes mellitus   . Hypertension   . Bipolar disorder   . Parkinson disease   . Diabetes mellitus type II     Medications:  See PTA medication list  Assessment: 77 yo male to begin vancomycin for suspected pneumonia. Patient presented to the ED with AMS.  Patient has received vancomycin 1 g at 11:46, Zosyn 3.375 g at 10:59, and levofloxacin 750 mg at 13:03.  Goal of Therapy:  Vancomycin trough level 15-20 mcg/ml  Plan:  1. Vancomycin 1 g IV q12h 2. Will follow-up renal fxn and culture data   [facility] 04/16/2012,1:57 PM

## 2012-04-16 NOTE — ED Notes (Signed)
See triage note by same RN.  RN did assessment prior to charting ED note.

## 2012-04-17 DIAGNOSIS — I369 Nonrheumatic tricuspid valve disorder, unspecified: Secondary | ICD-10-CM

## 2012-04-17 DIAGNOSIS — J9601 Acute respiratory failure with hypoxia: Secondary | ICD-10-CM | POA: Diagnosis present

## 2012-04-17 DIAGNOSIS — I959 Hypotension, unspecified: Secondary | ICD-10-CM

## 2012-04-17 DIAGNOSIS — D72829 Elevated white blood cell count, unspecified: Secondary | ICD-10-CM | POA: Diagnosis present

## 2012-04-17 DIAGNOSIS — D696 Thrombocytopenia, unspecified: Secondary | ICD-10-CM

## 2012-04-17 DIAGNOSIS — G20 Parkinson's disease: Secondary | ICD-10-CM

## 2012-04-17 DIAGNOSIS — E782 Mixed hyperlipidemia: Secondary | ICD-10-CM

## 2012-04-17 LAB — CBC
HCT: 34.5 % — ABNORMAL LOW (ref 39.0–52.0)
Hemoglobin: 11.4 g/dL — ABNORMAL LOW (ref 13.0–17.0)
MCH: 30.6 pg (ref 26.0–34.0)
MCHC: 33 g/dL (ref 30.0–36.0)

## 2012-04-17 LAB — CARDIAC PANEL(CRET KIN+CKTOT+MB+TROPI): Relative Index: INVALID (ref 0.0–2.5)

## 2012-04-17 LAB — BASIC METABOLIC PANEL
BUN: 17 mg/dL (ref 6–23)
Chloride: 109 mEq/L (ref 96–112)
GFR calc non Af Amer: 83 mL/min — ABNORMAL LOW (ref >90)
Glucose, Bld: 108 mg/dL — ABNORMAL HIGH (ref 70–99)
Potassium: 3.8 mEq/L (ref 3.5–5.1)

## 2012-04-17 LAB — GLUCOSE, CAPILLARY: Glucose-Capillary: 96 mg/dL (ref 70–99)

## 2012-04-17 LAB — LEGIONELLA ANTIGEN, URINE

## 2012-04-17 MED ORDER — LORAZEPAM 0.5 MG PO TABS
0.5000 mg | ORAL_TABLET | Freq: Every day | ORAL | Status: DC
  Administered 2012-04-17 – 2012-04-21 (×5): 0.5 mg via ORAL
  Filled 2012-04-17 (×5): qty 1

## 2012-04-17 MED ORDER — INSULIN ASPART 100 UNIT/ML ~~LOC~~ SOLN: 0.0000 [IU] | Freq: Two times a day (BID) | SUBCUTANEOUS | Status: DC

## 2012-04-17 MED ORDER — OXYCODONE HCL 5 MG PO TABS
5.0000 mg | ORAL_TABLET | ORAL | Status: DC | PRN
  Filled 2012-04-17: qty 2

## 2012-04-17 NOTE — Evaluation (Signed)
Physical Therapy Evaluation Patient Details Name: [name] MRN: [id number] DOB: 06/24/1935 Today's Date: 04/17/2012 Time: 4098-1191 PT Time Calculation (min): 22 min  PT Assessment / Plan / Recommendation Clinical Impression  Patient s/p SIRS with decr mobility secondary to confusion and slight deconditioning.  PAtient close to baseline ambulation per patient and caregiver.  Will perform balance test next session.  May benefit from HHPT but patient declining at this time.      PT Assessment  Patient needs continued PT services    Follow Up Recommendations  Home health PT;Supervision - Intermittent    Equipment Recommendations  None recommended by PT    Frequency Min 3X/week    Precautions / Restrictions Precautions Precautions: Fall Restrictions Weight Bearing Restrictions: No   Pertinent Vitals/Pain VSS/ No pain      Mobility  Bed Mobility Bed Mobility: Rolling Left;Left Sidelying to Sit Rolling Left: 5: Supervision;With rail Left Sidelying to Sit: 4: Min assist;With rails;HOB flat Details for Bed Mobility Assistance: PAtient somewhat whining about getting up even though he asked to get up Transfers Transfers: Sit to Stand;Stand to Sit Sit to Stand: 4: Min assist;With upper extremity assist;From bed (took incr time) Stand to Sit: 4: Min guard;With upper extremity assist;With armrests;To chair/3-in-1 Details for Transfer Assistance: Cues for hand placement Ambulation/Gait Ambulation/Gait Assistance: 4: Min assist (+1 for IV) Ambulation Distance (Feet): 135 Feet Assistive device: 1 person hand held assist Ambulation/Gait Assistance Details: Patient has shortened step length on left foot.  PAtient flexes trunk slightly forward with ambulation.  CNA [name] present and [name] and patient state that his gait is close to baseline status.   Gait Pattern: Decreased step length - left;Decreased stance time - right;Step-to pattern;Decreased stride length;Decreased  dorsiflexion - left;Shuffle;Trunk flexed;Lateral trunk lean to left Gait velocity: Quickened hurried pace General Gait Details: No significant LOB with activity even though patient was walking fast with gait deviations.   Stairs: No Wheelchair Mobility Wheelchair Mobility: No         PT Goals Acute Rehab PT Goals PT Goal Formulation: With patient Time For Goal Achievement: 04/24/12 Potential to Achieve Goals: Good Pt will Ambulate: >150 feet;with modified independence;with least restrictive assistive device PT Goal: Ambulate - Progress: Goal set today Pt will Go Up / Down Stairs: 1-2 stairs;with supervision;with least restrictive assistive device PT Goal: Up/Down Stairs - Progress: Goal set today  Visit Information  Last PT Received On: 04/17/12 Assistance Needed: +2    Subjective Data  Subjective: "I don't know if I need PT or not." Patient Stated Goal: To go home today.   Prior Functioning  Home Living Lives With: Alone Available Help at Discharge:  (alone 2pm-5:30 pm and 10:30pm - 8 am, 2 shifts of CNA's ) Type of Home: House Home Access: Stairs to enter [employer] of Steps: 2 Entrance Stairs-Rails: None Home Layout: One level Bathroom Shower/Tub: Engineer, manufacturing systems: Standard Home Adaptive Equipment: Paediatric nurse without back;Straight cane;Walker - rolling Prior Function Level of Independence: Independent with assistive device(s) Able to Take Stairs?: Yes Driving: No Vocation: Retired Musician: No difficulties Dominant Hand: Right    Cognition  Overall Cognitive Status: Appears within functional limits for tasks assessed/performed Area of Impairment: Attention;Safety/judgement;Awareness of deficits Arousal/Alertness: Awake/alert Orientation Level: Disoriented to;Time Behavior During Session: WFL for tasks performed Current Attention Level: Sustained Safety/Judgement: Decreased safety judgement for tasks assessed      Extremity/Trunk Assessment Right Upper Extremity Assessment RUE ROM/Strength/Tone: Within functional levels RUE Sensation: WFL - Light Touch  RUE Coordination: WFL - gross/fine motor Left Upper Extremity Assessment LUE ROM/Strength/Tone: Deficits LUE ROM/Strength/Tone Deficits: Does not have full ROM/strength in LUE LUE Sensation: WFL - Light Touch LUE Coordination: WFL - gross/fine motor Right Lower Extremity Assessment RLE ROM/Strength/Tone: Within functional levels RLE Sensation: WFL - Light Touch RLE Coordination: WFL - gross/fine motor Left Lower Extremity Assessment LLE ROM/Strength/Tone: Deficits LLE ROM/Strength/Tone Deficits: Dorsiflexion weak at 3-/5.  grossly 4-/5. LLE Sensation: WFL - Light Touch LLE Coordination: WFL - gross/fine motor Trunk Assessment Trunk Assessment: Normal;Other exceptions Trunk Exceptions: slight flexed posture   Balance    End of Session PT - End of Session Equipment Utilized During Treatment: Gait belt Activity Tolerance: Patient limited by fatigue Patient left: in chair;with call bell/phone within reach;with family/visitor present [name], CNA in room) Nurse Communication: Mobility status   INGOLD,Heath Tesler 04/17/2012, 10:48 AM  [facility] [phone] (pager)

## 2012-04-17 NOTE — Progress Notes (Signed)
   CARE MANAGEMENT NOTE 04/17/2012  Patient:  [name], [name]   Account Number:  [id number]  Date Initiated:  04/17/2012  Documentation initiated by:  Onnie Boer  Subjective/Objective Assessment:   PT WAS ADMITTED WITH SEPSIS     Action/Plan:   PROGRESSION OF CARE AND DISCHARGE PLANNING   Anticipated DC Date:  04/21/2012   Anticipated DC Plan:  HOME W HOME HEALTH SERVICES      DC Planning Services  CM consult      Choice offered to / List presented to:             Status of service:  In process, will continue to follow Medicare Important Message given?   (If response is "NO", the following Medicare IM given date fields will be blank) Date Medicare IM given:   Date Additional Medicare IM given:    Discharge Disposition:    Per UR Regulation:  Reviewed for med. necessity/level of care/duration of stay  If discussed at Long Length of Stay Meetings, dates discussed:    Comments:  04/17/12 Onnie Boer, RN, BSN 1654 PT WAS ADMITTED WITH SEPSIS.  PT IS FROM HOME WITH COMFORT KEEPERS AIDE A FEW HRS IN THE AM AND PM 5 DAYS A WEEK.  PT IS REALLY CONCERNED ABOUT OTHERS COMING IN HIS HOME AND WANTS TO MAKE SURE HIS COMFORT KEEPERS STAY INTACT.  WILL F/U AFTER PT EVAL.

## 2012-04-17 NOTE — Progress Notes (Signed)
[facility] [place] TEAM 1 - Stepdown/ICU TEAM  PCP:  [name], MD, MD  Subjective: 77 year old male with history of Parkinson's, diabetes, hypertension, bipolar disorder was brought to the [facility] ED by his home health nurse for altered mental status. History was obtained from a home health nurse. Patient appears to be significantly confused although alert and awake. Per the patient's nurse, patient has intermittent home health and supervision at home, he is by himself at night until she arrives at 9 AM in the morning. When she came to his house, she noticed him laying on his bed with the legs dangling down. Patient appeared to be extremely confused and was unable to provide any history. She noticed that patient was extremely weak and unable to get up from the bed and called EMS..   This morning the pt is doing very well.  He is alert and conversant.  He denies f/c, sob, n/v, abdom pain.  He reports that he has had an indwelling foley for ~8 years due to urinary retention.  Objective:  Intake/Output Summary (Last 24 hours) at 04/17/12 1236 Last data filed at 04/17/12 0932  Gross per 24 hour  Intake 2942.08 ml  Output   3500 ml  Net -557.92 ml   Blood pressure 126/63, pulse 57, temperature 98.7 F (37.1 C), temperature source Core (Comment), resp. rate 17, height 5\' 10"  (1.778 m), weight 73.2 kg (161 lb 6 oz), SpO2 100.00%.  CBG (last 3)   Basename 04/17/12 1141 04/17/12 0734 04/16/12 2129  GLUCAP 123* 96 84   Physical Exam: General: No acute respiratory distress - A&O x4 Lungs: crackles in R base - no wheeze - CTA th/o other fields Cardiovascular: Regular rate and rhythm without murmur gallop or rub normal S1 and S2 Abdomen: Nontender, nondistended, soft, bowel sounds positive, no rebound, no ascites, no appreciable mass Extremities: No significant cyanosis, clubbing, or edema bilateral lower extremities  Lab Results:  Basename 04/17/12 0635 04/16/12 1608 04/16/12  1035  NA 139 -- 139  K 3.8 -- 3.7  CL 109 -- 105  CO2 24 -- 23  GLUCOSE 108* -- 156*  BUN 17 -- 28*  CREATININE 0.86 0.92 0.93  CALCIUM 8.6 -- 9.0  MG -- -- --  PHOS -- -- --    Basename 04/16/12 1035  AST 15  ALT 25  ALKPHOS 75  BILITOT 0.4  PROT 5.6*  ALBUMIN 3.9    Basename 04/17/12 0635 04/16/12 1608 04/16/12 1035  WBC 9.2 13.9* 13.3*  NEUTROABS -- -- 12.3*  HGB 11.4* 12.9* 14.7  HCT 34.5* 38.1* 43.3  MCV 92.7 91.6 91.9  PLT 195 221 225    Basename 04/17/12 0634 04/16/12 2225 04/16/12 1453  CKTOTAL 40 49 43  CKMB 3.1 3.2 3.1  CKMBINDEX -- -- --  TROPONINI <0.30 <0.30 0.50*    Basename 04/16/12 1608  HGBA1C 5.3    Micro Results: Recent Results (from the past 240 hour(s))  CULTURE, BLOOD (ROUTINE X 2)     Status: Normal (Preliminary result)   Collection Time   04/16/12 10:35 AM      Component Value Range Status Comment   Specimen Description BLOOD RIGHT ANTECUBITAL   Final    Special Requests BOTTLES DRAWN AEROBIC AND ANAEROBIC 10CC   Final    Culture  Setup Time [id number]   Final    Culture     Final    Value: GRAM POSITIVE COCCI IN CLUSTERS     Note: CRITICAL RESULT CALLED  TO, READ BACK BY AND VERIFIED WITH: A. AFATSAWO 04/17/12 11:25 BY GARRS   Report Status PENDING   Incomplete   CULTURE, BLOOD (ROUTINE X 2)     Status: Normal (Preliminary result)   Collection Time   04/16/12 10:45 AM      Component Value Range Status Comment   Specimen Description BLOOD LEFT FOREARM   Final    Special Requests BOTTLES DRAWN AEROBIC AND ANAEROBIC 10CC   Final    Culture  Setup Time [id number]   Final    Culture     Final    Value:        BLOOD CULTURE RECEIVED NO GROWTH TO DATE CULTURE WILL BE HELD FOR 5 DAYS BEFORE ISSUING A FINAL NEGATIVE REPORT   Report Status PENDING   Incomplete   MRSA PCR SCREENING     Status: Abnormal   Collection Time   04/16/12  2:40 PM      Component Value Range Status Comment   MRSA by PCR POSITIVE (*) NEGATIVE  Final   CULTURE,  EXPECTORATED SPUTUM-ASSESSMENT     Status: Normal   Collection Time   04/16/12  4:55 PM      Component Value Range Status Comment   Specimen Description SPUTUM   Final    Special Requests NONE   Final    Sputum evaluation     Final    Value: THIS SPECIMEN IS ACCEPTABLE. RESPIRATORY CULTURE REPORT TO FOLLOW.   Report Status 04/16/2012 FINAL   Final   CULTURE, RESPIRATORY     Status: Normal (Preliminary result)   Collection Time   04/16/12  4:55 PM      Component Value Range Status Comment   Specimen Description SPUTUM   Final    Special Requests NONE   Final    Gram Stain     Final    Value: ABUNDANT WBC PRESENT, PREDOMINANTLY PMN     NO SQUAMOUS EPITHELIAL CELLS SEEN     RARE GRAM POSITIVE COCCI IN PAIRS     RARE GRAM NEGATIVE RODS   Culture NO GROWTH   Final    Report Status PENDING   Incomplete     Studies/Results: All recent x-ray/radiology reports have been reviewed in detail.   Medications: I have reviewed the patient's complete medication list.  Assessment/Plan:  Toxic Metabolic Encephalopathy Resolved - due to acute infectious illness in the setting of a chronic neurodegenerative disease  UTI/Pyelonephritis Await urine cx - cont broad spectrum abx coverage for now - indwelling foley is clear risk factor   RLL CAP No concerns noted by SLP during bedside swallow eval - if pt did have an aspiration event, it was likely an isolated single event, perhaps brought about by his acute illness and mental status changes - f/u CXR in AM - cont empiric abx coverage for now  1/2 + Blood Cx - gram + cocci in clusters (nasal swab MRSA +) may well prove to be staph epi (a probable contaminant) but can't rule out more pathogenic organisms until culture complete - is on vanc at present  Hypotension Resolved - BP well controlled and in normal range - likely due to volume depletion in setting of acute infectious illness  Very mildly elevated troponin Resolved - not likely reflective  of significant acute cardiac event - echo to be accomplished to r/o a focal WMA  Parkinson's Disease Continue home meds - no acute complicating factors  Bipolar D/O Appears stable at this time  DM  Reasonably controlled at the present time - cont to follow trend - A1c at goal  HTN Not an active problem at this time  Dispo Stable for transfer to med bed - begin PT/OT - is ambulatory at baseline - lives in his own home, with daytime assist only  Lonia Blood, MD [facility] Office  [phone] Pager [phone]  On-Call/Text Page:      [email or web address]      password [facility]

## 2012-04-17 NOTE — Evaluation (Signed)
Clinical/Bedside Swallow Evaluation Patient Details  Name: [name] MRN: [id number] Date of Birth: [birth year]  Today's Date: 04/17/2012 Time: 1140-1200 SLP Time Calculation (min): 20 min  Past Medical History:  Past Medical History  Diagnosis Date  . Hearing aid worn   . Hypertension   . Parkinson disease   . Pneumonia   . Diabetes mellitus   . Diabetes mellitus type II   . Neuromuscular disorder     parkinsons  . Arthritis   . Bipolar disorder   . Anxiety   . Depression    Past Surgical History:  Past Surgical History  Procedure Date  . Total hip arthroplasty    HPI:  77 year old male with history of Parkinson's, diabetes, hypertension, bipolar disorder was brought to the [facility] ED by his home health nurse for altered mental status. Pt found to have toxic metabolic encephalopathy due to infectious illness in setting of degenerative neuromuscular disease.    Assessment / Plan / Recommendation Clinical Impression  Pt does not exhibit any overt s/s of aspiration or dysphagia and he and caregiver (present at bedside) deny any pna, coughing or difficulty swallowing. Pt always eats meals in a chair, never in bed. Feel aspiration suspected on CXR not likely due to a baseline dysphagia. Cannot rule out silent aspiraiton in this case. MD if aspiration is a concern with this pt please order MBS. Otherwise SLP will sign off.     Aspiration Risk  Mild    Diet Recommendation Dysphagia 3 (Mechanical Soft);Thin liquid   Other  Recommendations     Follow Up Recommendations  None    Frequency and Duration        Pertinent Vitals/Pain None    SLP Swallow Goals     Swallow Study Prior Functional Status       General HPI: 77 year old male with history of Parkinson's, diabetes, hypertension, bipolar disorder was brought to the [facility] ED by his home health nurse for altered mental status. Pt found to have toxic metabolic encephalopathy due to infectious illness in  setting of degenerative neuromuscular disease.  Type of Study: Bedside swallow evaluation Diet Prior to this Study: Dysphagia 3 (soft);Thin liquids Temperature Spikes Noted: No Respiratory Status: Supplemental O2 delivered via (comment) Behavior/Cognition: Alert;Cooperative;Pleasant mood Oral Cavity - Dentition: Adequate natural dentition Vision: Functional for self-feeding Patient Positioning: Upright in bed Baseline Vocal Quality: Clear Volitional Cough: Strong Volitional Swallow: Able to elicit    Oral/Motor/Sensory Function Overall Oral Motor/Sensory Function: Appears within functional limits for tasks assessed   Ice Chips Ice chips: Not tested   Thin Liquid Thin Liquid: Within functional limits Presentation: Straw;Self Fed    Nectar Thick Nectar Thick Liquid: Not tested   Honey Thick Honey Thick Liquid: Not tested   Puree Puree: Within functional limits Presentation: Self Fed;Spoon   Solid Solid: Within functional limits    Errika Narvaiz, Riley Nearing 04/17/2012,1:55 PM

## 2012-04-17 NOTE — Progress Notes (Signed)
  Echocardiogram 2D Echocardiogram has been performed.  [name] 04/17/2012, 8:52 AM

## 2012-04-17 NOTE — Progress Notes (Signed)
eLink Physician-Brief Progress Note Patient Name: [name] DOB: 1935/10/10 MRN: [id number]  Date of Service  04/17/2012   HPI/Events of Note  bLOOD CULTURE REVIEWED AS elinK DUTY 1/2 CLUSTERS D/W PRIMARY TEAM CLINICAL SCENARIO   eICU Interventions  D/W dR MCLUNG CONTINUE VANC   Intervention Category Minor Interventions: Communication with other healthcare providers and/or family  [name]. 04/17/2012, 4:07 PM

## 2012-04-18 ENCOUNTER — Inpatient Hospital Stay (HOSPITAL_COMMUNITY): Payer: Medicare Other

## 2012-04-18 DIAGNOSIS — D696 Thrombocytopenia, unspecified: Secondary | ICD-10-CM

## 2012-04-18 DIAGNOSIS — G20 Parkinson's disease: Secondary | ICD-10-CM

## 2012-04-18 DIAGNOSIS — I959 Hypotension, unspecified: Secondary | ICD-10-CM

## 2012-04-18 DIAGNOSIS — E782 Mixed hyperlipidemia: Secondary | ICD-10-CM

## 2012-04-18 LAB — CULTURE, RESPIRATORY W GRAM STAIN: Culture: NORMAL

## 2012-04-18 LAB — BASIC METABOLIC PANEL
BUN: 13 mg/dL (ref 6–23)
Chloride: 112 mEq/L (ref 96–112)
Glucose, Bld: 100 mg/dL — ABNORMAL HIGH (ref 70–99)
Potassium: 3.9 mEq/L (ref 3.5–5.1)

## 2012-04-18 LAB — URINE CULTURE
Colony Count: 60000
Culture  Setup Time: 201304301120
Culture  Setup Time: 201304301900
Special Requests: NORMAL

## 2012-04-18 LAB — CBC
HCT: 35.9 % — ABNORMAL LOW (ref 39.0–52.0)
Hemoglobin: 11.9 g/dL — ABNORMAL LOW (ref 13.0–17.0)
MCH: 31 pg (ref 26.0–34.0)
MCHC: 33.1 g/dL (ref 30.0–36.0)

## 2012-04-18 LAB — CULTURE, BLOOD (ROUTINE X 2)

## 2012-04-18 NOTE — Progress Notes (Signed)
Subjective: Looking forward to going home.  Objective: Weight change:   Intake/Output Summary (Last 24 hours) at 04/18/12 0836 Last data filed at 04/18/12 0600  Gross per 24 hour  Intake   1440 ml  Output   2650 ml  Net  -1210 ml    Filed Vitals:   04/18/12 0642  BP: 167/78  Pulse: 57  Temp: 97.2 F (36.2 C)  Resp: 20  Physical Exam:  General: No acute respiratory distress - A&O x4  Lungs: crackles in R base - no wheeze - CTA th/o other fields  Cardiovascular: Regular rate and rhythm without murmur gallop or rub normal S1 and S2  Abdomen: Nontender, nondistended, soft, bowel sounds positive, no rebound, no ascites, no appreciable mass  Extremities: No significant cyanosis, clubbing, or edema bilateral lower extremities   Results for orders placed during the hospital encounter of 04/16/12 (from the past 24 hour(s))  GLUCOSE, CAPILLARY     Status: Abnormal   Collection Time   04/17/12  9:06 PM      Component Value Range   Glucose-Capillary 122 (*) 70 - 99 (mg/dL)  BASIC METABOLIC PANEL     Status: Abnormal   Collection Time   04/18/12  6:50 AM      Component Value Range   Sodium 141  135 - 145 (mEq/L)   Potassium 3.9  3.5 - 5.1 (mEq/L)   Chloride 112  96 - 112 (mEq/L)   CO2 19  19 - 32 (mEq/L)   Glucose, Bld 100 (*) 70 - 99 (mg/dL)   BUN 13  6 - 23 (mg/dL)   Creatinine, Ser 8.11  0.50 - 1.35 (mg/dL)   Calcium 8.8  8.4 - 91.4 (mg/dL)   GFR calc non Af Amer 84 (*) >90 (mL/min)   GFR calc Af Amer >90  >90 (mL/min)  CBC     Status: Abnormal   Collection Time   04/18/12  6:50 AM      Component Value Range   WBC 7.6  4.0 - 10.5 (K/uL)   RBC 3.84 (*) 4.22 - 5.81 (MIL/uL)   Hemoglobin 11.9 (*) 13.0 - 17.0 (g/dL)   HCT 78.2 (*) 95.6 - 52.0 (%)   MCV 93.5  78.0 - 100.0 (fL)   MCH 31.0  26.0 - 34.0 (pg)   MCHC 33.1  30.0 - 36.0 (g/dL)   RDW 21.3  08.6 - 57.8 (%)   Platelets 208  150 - 400 (K/uL)  GLUCOSE, CAPILLARY     Status: Normal   Collection Time   04/18/12  7:54 AM      Component Value Range   Glucose-Capillary 97  70 - 99 (mg/dL)  GLUCOSE, CAPILLARY     Status: Abnormal   Collection Time   04/18/12 12:02 PM      Component Value Range   Glucose-Capillary 104 (*) 70 - 99 (mg/dL)  GLUCOSE, CAPILLARY     Status: Abnormal   Collection Time   04/18/12  3:49 PM      Component Value Range   Glucose-Capillary 105 (*) 70 - 99 (mg/dL)       Micro Results: Recent Results (from the past 240 hour(s))  URINE CULTURE     Status: Normal (Preliminary result)   Collection Time   04/16/12 10:30 AM      Component Value Range Status Comment   Specimen Description URINE, CATHETERIZED   Final    Special Requests NONE   Final    Culture  Setup Time [id number]  Final    Colony Count PENDING   Incomplete    Culture Culture reincubated for better growth   Final    Report Status PENDING   Incomplete   CULTURE, BLOOD (ROUTINE X 2)     Status: Normal (Preliminary result)   Collection Time   04/16/12 10:35 AM      Component Value Range Status Comment   Specimen Description BLOOD RIGHT ANTECUBITAL   Final    Special Requests BOTTLES DRAWN AEROBIC AND ANAEROBIC 10CC   Final    Culture  Setup Time [id number]   Final    Culture     Final    Value: GRAM POSITIVE COCCI IN CLUSTERS     Note: CRITICAL RESULT CALLED TO, READ BACK BY AND VERIFIED WITH: A. AFATSAWO 04/17/12 11:25 BY GARRS   Report Status PENDING   Incomplete   CULTURE, BLOOD (ROUTINE X 2)     Status: Normal (Preliminary result)   Collection Time   04/16/12 10:45 AM      Component Value Range Status Comment   Specimen Description BLOOD LEFT FOREARM   Final    Special Requests BOTTLES DRAWN AEROBIC AND ANAEROBIC 10CC   Final    Culture  Setup Time [id number]   Final    Culture     Final    Value:        BLOOD CULTURE RECEIVED NO GROWTH TO DATE CULTURE WILL BE HELD FOR 5 DAYS BEFORE ISSUING A FINAL NEGATIVE REPORT   Report Status PENDING   Incomplete   MRSA PCR SCREENING     Status: Abnormal   Collection  Time   04/16/12  2:40 PM      Component Value Range Status Comment   MRSA by PCR POSITIVE (*) NEGATIVE  Final   CULTURE, EXPECTORATED SPUTUM-ASSESSMENT     Status: Normal   Collection Time   04/16/12  4:55 PM      Component Value Range Status Comment   Specimen Description SPUTUM   Final    Special Requests NONE   Final    Sputum evaluation     Final    Value: THIS SPECIMEN IS ACCEPTABLE. RESPIRATORY CULTURE REPORT TO FOLLOW.   Report Status 04/16/2012 FINAL   Final   CULTURE, RESPIRATORY     Status: Normal (Preliminary result)   Collection Time   04/16/12  4:55 PM      Component Value Range Status Comment   Specimen Description SPUTUM   Final    Special Requests NONE   Final    Gram Stain     Final    Value: ABUNDANT WBC PRESENT, PREDOMINANTLY PMN     NO SQUAMOUS EPITHELIAL CELLS SEEN     RARE GRAM POSITIVE COCCI IN PAIRS     RARE GRAM NEGATIVE RODS   Culture NO GROWTH   Final    Report Status PENDING   Incomplete   URINE CULTURE     Status: Normal   Collection Time   04/16/12  5:40 PM      Component Value Range Status Comment   Specimen Description URINE, CATHETERIZED   Final    Special Requests Normal   Final    Culture  Setup Time [id number]   Final    Colony Count 60,000 COLONIES/ML   Final    Culture     Final    Value: Multiple bacterial morphotypes present, none predominant. Suggest appropriate recollection if clinically indicated.   Report Status 04/18/2012 FINAL  Final     Studies/Results: Dg Chest Portable 1 View  04/16/2012  *RADIOLOGY REPORT*  Clinical Data: Altered mental status.  PORTABLE CHEST - 1 VIEW  Comparison: Chest x-ray 09/20/2010.  Findings: Compared to the prior examination, there is interval development of patchy multifocal airspace disease throughout the right mid and lower lung, favored to reflect right lower lobe pneumonia.  No definite pleural effusions.  Pulmonary vasculature is within normal limits.  Heart size is mildly enlarged (unchanged).   Atherosclerotic calcification within the arch of the aorta.  Multiple old healed left-sided rib fractures are again noted.  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  IMPRESSION: 1.  Findings concerning for right lower lobe pneumonia.  In the setting of altered mental status, these findings could alternatively reflect a large volume aspiration.  Clinical correlation is recommended. 2.  Atherosclerosis.  Original Report Authenticated By: Florencia Reasons, M.D.   Medications: Scheduled Meds:   . aspirin EC  81 mg Oral Daily  . carbidopa-levodopa  1 tablet Oral TID  . cefTRIAXone (ROCEPHIN)  IV  1 g Intravenous Q24H  . Chlorhexidine Gluconate Cloth  6 each Topical Q0600  . enoxaparin  40 mg Subcutaneous QHS  . entacapone  200 mg Oral TID  . insulin aspart  0-5 Units Subcutaneous QHS  . insulin aspart  0-9 Units Subcutaneous BID AC  . lamoTRIgine  150 mg Oral BID  . levofloxacin (LEVAQUIN) IV  750 mg Intravenous Q24H  . lithium carbonate  300 mg Oral BID WC  . LORazepam  0.5 mg Oral Q1200  . mulitivitamin with minerals  1 tablet Oral Daily  . mupirocin ointment  1 application Nasal BID  . pantoprazole  40 mg Oral Q1200  . sertraline  100 mg Oral Daily  . vancomycin  1,000 mg Intravenous Q12H  . DISCONTD: albuterol  2.5 mg Nebulization Q6H  . DISCONTD: insulin aspart  0-9 Units Subcutaneous TID WC   Continuous Infusions:   . sodium chloride 100 mL/hr at 04/18/12 0600   PRN Meds:.acetaminophen, ondansetron, oxyCODONE, temazepam  Assessment/Plan: Patient Active Hospital Problem List: Toxic Metabolic Encephalopathy  Resolved - due to acute infectious illness in the setting of a chronic neurodegenerative disease  UTI/Pyelonephritis  Urine culture shows 60,000 colonies and multiple bacterial morphotypes. Continue with abx. RLL CAP  No concerns noted by SLP during bedside swallow eval - if pt did have an aspiration event, it was likely an  isolated single event, perhaps brought about by his acute illness and mental status changes - f/u CXR in AM - cont empiric abx coverage for now  1/2 + Blood Cx - gram + cocci in clusters (nasal swab MRSA +)  may well prove to be staph epi (a probable contaminant) but can't rule out more pathogenic organisms until culture complete - is on vanc at present . Will repeat cxr in am.  Hypotension  Resolved - BP well controlled and in normal range - likely due to volume depletion in setting of acute infectious illness  Very mildly elevated troponin  Resolved - not likely reflective of significant acute cardiac event - echo to be accomplished to r/o a focal WMA  Parkinson's Disease  Continue home meds - no acute complicating factors  Bipolar D/O  Appears stable at this time  DM  Reasonably controlled at the present time - cont to follow trend - A1c at goal  HTN  Not an active problem at this time  LOS: 2 days   Kavina Cantave 04/18/2012, 8:36 AM

## 2012-04-18 NOTE — Progress Notes (Signed)
Physical Therapy Treatment Patient Details Name: [name] MRN: [id number] DOB: [birth year] Today's Date: 04/18/2012 Time: 1336-1410 PT Time Calculation (min): 34 min  PT Assessment / Plan / Recommendation Comments on Treatment Session  Pt has limitations with gait due to unsteadiness. Pt tends to lean/dift to left during amb,pt is aware of tendency but lacks awareness of safety moving around objects. Pt had LOB once with static standing  and is was not aware he was falling, able to maintain balance during  balance activities. Try stairs next session.     Follow Up Recommendations  Home health PT;Supervision - Intermittent    Equipment Recommendations  None recommended by OT;None recommended by PT    Frequency Min 3X/week   Plan Discharge plan remains appropriate;Frequency remains appropriate    Precautions / Restrictions Precautions Precautions: Fall Restrictions Weight Bearing Restrictions: No   Pertinent Vitals/Pain     Mobility  Bed Mobility Bed Mobility: Rolling Right;Right Sidelying to Sit;Sitting - Scoot to [place] of Bed;Scooting to [facility];Sit to Supine Rolling Right: 4: Min assist Right Sidelying to Sit: 4: Min assist Supine to Sit: 5: Supervision;With rails Sitting - Scoot to Edge of Bed: 4: Min assist Sit to Supine: 4: Min assist Scooting to [facility]: 3: Mod assist Details for Bed Mobility Assistance: Pt required assistance with trunk to initiate all bed mobility. VC's for hand placement  and proper technique  Transfers Transfers: Sit to Stand;Stand to Sit Sit to Stand: 4: Min guard;With upper extremity assist;From bed Stand to Sit: 4: Min guard;To bed Details for Transfer Assistance: Pt able to perform transfer with good hand placement. No assistance needed Min guard A due to unsteadiness. Ambulation/Gait Ambulation/Gait Assistance: 4: Min assist Ambulation Distance (Feet): 250 Feet Assistive device: 1 person hand held assist Ambulation/Gait Assistance Details: Pt  needed assistance tostabilize balance during amb. Pt tends to lean towards left. VC's for upright posture, to widen BOS and to decrease gait speed to ensure safety during amb. No decrease in gait velocity during DGI activities but decreased awareness of objets.  Gait Pattern: Trunk flexed;Narrow base of support;Decreased stride length;Step-through pattern;Shuffle;Lateral trunk lean to right Stairs: No Wheelchair Mobility Wheelchair Mobility: No    Exercises     PT Goals Acute Rehab PT Goals PT Goal: Ambulate - Progress: Progressing toward goal  Visit Information  Last PT Received On: 04/18/12    Subjective Data  Subjective: Pt stated he does not like being in the hospital.   Cognition  Overall Cognitive Status: Appears within functional limits for tasks assessed/performed Area of Impairment: Attention;Awareness of deficits;Safety/judgement Arousal/Alertness: Awake/alert Orientation Level: Appears intact for tasks assessed Behavior During Session: [facility] for tasks performed Current Attention Level: Sustained Attention - Other Comments: Pt became slightly agitated during cognitive questions (orientation to time) and reports that he does not appreciate these questions.  Pt required moderate encouragement to participate in session and verbaly questioned "Do I have to do this?" Safety/Judgement: Decreased safety judgement for tasks assessed Awareness of Deficits: Pt aware of limitations when amb  Cognition - Other Comments: Pt had some agitated about amb with PT and Insurance.      Balance  Dynamic Standing Balance Dynamic Standing - Balance Support: No upper extremity supported;During functional activity Dynamic Standing - Level of Assistance: 5: Stand by assistance Dynamic Standing - Comments: Pt had LOB due to decreased BOS. After VC"s to widen BOS pt was able to perform multidimensional  reaching  activities with some unsteadiness but no LOB.    End  of Session PT - End of  Session Equipment Utilized During Treatment: Gait belt Activity Tolerance: Patient tolerated treatment well Patient left: in bed;with call bell/phone within reach Nurse Communication: Mobility status    Tamera Stands 04/18/2012, 3:11 PM

## 2012-04-18 NOTE — Progress Notes (Signed)
04/18/2012 Fredrich Birks PTA [phone] pager [phone] office

## 2012-04-18 NOTE — Care Management Note (Signed)
    Page 1 of 1   04/18/2012     11:24:53 AM   CARE MANAGEMENT NOTE 04/18/2012  Patient:  [name], [name]   Account Number:  [id number]  Date Initiated:  04/17/2012  Documentation initiated by:  Onnie Boer  Subjective/Objective Assessment:   PT WAS ADMITTED WITH SEPSIS     Action/Plan:   PROGRESSION OF CARE AND DISCHARGE PLANNING  Pt eval- recommended HHPT   Anticipated DC Date:  04/21/2012   Anticipated DC Plan:  HOME/SELF CARE      DC Planning Services  CM consult      Choice offered to / List presented to:             Status of service:  In process, will continue to follow Medicare Important Message given?   (If response is "NO", the following Medicare IM given date fields will be blank) Date Medicare IM given:   Date Additional Medicare IM given:    Discharge Disposition:    Per UR Regulation:  Reviewed for med. necessity/level of care/duration of stay  If discussed at Long Length of Stay Meetings, dates discussed:    Comments:  04/18/12 Spoke with patient and his aide from [employer] about d/c plans and HHPT.Explained PT recommended HHPT, patent states that he does not feel that he needs HHPT. He states that he thinks he is doing as well as ususal. His aide [name] stated that he has been doing very well getting up to the bathroom and walking in the hall.She agrees that he is at his baseline. Patient goes to a gym and has Parkinson's classes twice a week, usually uses a cane. Patient refusing HHPT at present. Will continue to follow for discharge needs.  Jacquelynn Cree RN, BSN, CCM   04/17/12 Onnie Boer, RN, BSN 1654 PT WAS ADMITTED WITH SEPSIS.  PT IS FROM HOME WITH [employer] AIDE A FEW HRS IN THE AM AND PM 5 DAYS A WEEK.  PT IS REALLY CONCERNED ABOUT OTHERS COMING IN HIS HOME AND WANTS TO MAKE SURE HIS [employer] STAY INTACT.  WILL F/U AFTER PT EVAL.

## 2012-04-18 NOTE — Progress Notes (Signed)
Occupational Therapy Evaluation Patient Details Name: [name] MRN: [id number] DOB: 12-24-1934 Today's Date: 04/18/2012 Time: 4098-1191 OT Time Calculation (min): 17 min  OT Assessment / Plan / Recommendation Clinical Impression  Pt s/p SIRS and demonstrating with generalized weakness. Will benefit from acute OT to address below problem list in prep for d/c home with home health aides.  Recommending HHOT if pt agreeable.    OT Assessment  Patient needs continued OT Services    Follow Up Recommendations  Home health OT    Equipment Recommendations  None recommended by OT    Frequency Min 2X/week    Precautions / Restrictions Precautions Precautions: Fall   Pertinent Vitals/Pain NA     ADL  Grooming: Simulated;Wash/dry hands;Wash/dry face;Brushing hair;Set up Where Assessed - Grooming: Unsupported sitting Upper Body Bathing: Simulated;Set up Where Assessed - Upper Body Bathing: Sitting, bed Lower Body Bathing: Simulated;Minimal assistance Where Assessed - Lower Body Bathing: Sit to stand from bed Upper Body Dressing: Simulated;Set up Where Assessed - Upper Body Dressing: Sitting, bed Lower Body Dressing: Simulated;Minimal assistance Where Assessed - Lower Body Dressing: Sit to stand from bed Ambulation Related to ADLs: Pt declining OOB transfer at this time. ADL Comments: Pt appears near baseline per CNA [name]) report.    OT Goals Acute Rehab OT Goals OT Goal Formulation: With patient Time For Goal Achievement: 04/25/12 Potential to Achieve Goals: Good ADL Goals Pt Will Perform Grooming: with modified independence;Sitting at sink;Standing at sink ADL Goal: Grooming - Progress: Goal set today Pt Will Perform Lower Body Bathing: with set-up;Sit to stand from chair;Sit to stand from bed ADL Goal: Lower Body Bathing - Progress: Goal set today Pt Will Perform Lower Body Dressing: with set-up;Sit to stand from chair;Sit to stand from bed ADL Goal: Lower Body  Dressing - Progress: Goal set today Pt Will Transfer to Toilet: with modified independence;with DME;Regular height toilet;Ambulation ADL Goal: Toilet Transfer - Progress: Goal set today Pt Will Perform Tub/Shower Transfer: Shower transfer;with modified independence;Ambulation;with DME;Shower seat without back ADL Goal: Web designer - Progress: Goal set today Arm Goals Additional Arm Goal #1: Pt will independently perform UE HEP in prep for functional transfers. Arm Goal: Additional Goal #1 - Progress: Goal set today  Visit Information  Last OT Received On: 04/18/12    Subjective Data      Prior Functioning  Home Living Lives With: Alone Available Help at Discharge:  (2pm-5:30 pm and 10:30 pm - 8 am, 2 shifts of CNA's) Type of Home: House Home Access: Stairs to enter [employer] of Steps: 2 Entrance Stairs-Rails: None Home Layout: One level Bathroom Shower/Tub: Tub/shower unit;Walk-in Stage manager: Standard Home Adaptive Equipment: Shower chair without back;Straight cane;Walker - rolling Prior Function Level of Independence: Independent with assistive device(s) Able to Take Stairs?: Yes Driving: No Vocation: Retired Comments: Pt reports he goes to [employer] and attends Parkinson's classes.  [name] (CNA from home) reports that she assist pt with socks but he can don shoes independently. Communication Communication: No difficulties Dominant Hand: Right    Cognition  Overall Cognitive Status: Appears within functional limits for tasks assessed/performed Area of Impairment: Attention;Safety/judgement;Awareness of deficits Arousal/Alertness: Awake/alert Orientation Level: Disoriented to;Time Behavior During Session: [employer] for tasks performed Current Attention Level: Sustained Attention - Other Comments: Pt became slightly agitated during cognitive questions (orientation to time) and reports that he does not appreciate these questions.  Pt required  moderate encouragement to participate in session and verbaly questioned "Do I have to do this?"  Safety/Judgement: Decreased safety judgement for tasks assessed    Extremity/Trunk Assessment Right Upper Extremity Assessment RUE ROM/Strength/Tone: Within functional levels RUE Sensation: WFL - Light Touch RUE Coordination: WFL - gross/fine motor Left Upper Extremity Assessment LUE ROM/Strength/Tone: Deficits (WFL for tasks assessed during session) LUE ROM/Strength/Tone Deficits: Does not have full ROM/strength in LUE LUE Sensation: WFL - Light Touch LUE Coordination: WFL - gross/fine motor   Mobility Bed Mobility Bed Mobility: Supine to Sit;Sit to Supine;Sitting - Scoot to Edge of Bed Supine to Sit: 5: Supervision;With rails Sitting - Scoot to Edge of Bed: 5: Supervision Sit to Supine: 5: Supervision Details for Bed Mobility Assistance: Pt requires increased time to perform bed mobility and cueing for efficient technique. Transfers Transfers: Sit to Stand;Stand to Sit Sit to Stand: 4: Min assist;With upper extremity assist;From bed Stand to Sit: 4: Min guard;To bed;With upper extremity assist Details for Transfer Assistance: Cues for safe hand placement   Exercise    Balance    End of Session OT - End of Session Equipment Utilized During Treatment: Gait belt Activity Tolerance: Patient limited by fatigue;Other (comment) (refusing OOB activity) Patient left: in bed;with call bell/phone within reach;with family/visitor present Nurse Communication: Mobility status  04/18/2012 [name] OTR/L Pager [phone] Office [phone]  Deadrian, Toya 04/18/2012, 1:58 PM

## 2012-04-19 DIAGNOSIS — G20 Parkinson's disease: Secondary | ICD-10-CM

## 2012-04-19 DIAGNOSIS — I959 Hypotension, unspecified: Secondary | ICD-10-CM

## 2012-04-19 DIAGNOSIS — E782 Mixed hyperlipidemia: Secondary | ICD-10-CM

## 2012-04-19 DIAGNOSIS — D696 Thrombocytopenia, unspecified: Secondary | ICD-10-CM

## 2012-04-19 LAB — GLUCOSE, CAPILLARY
Glucose-Capillary: 115 mg/dL — ABNORMAL HIGH (ref 70–99)
Glucose-Capillary: 102 mg/dL — ABNORMAL HIGH (ref 70–99)

## 2012-04-19 NOTE — Progress Notes (Signed)
ANTIBIOTIC CONSULT NOTE - FOLLOW UP  Pharmacy Consult for Vancomycin, Ceftriaxone, and Levaquin Indication: pneumonia, UTI  Allergies  Allergen Reactions  . Cholestatin     Patient Measurements: Height: 5\' 10"  (177.8 cm) Weight: 161 lb 12.8 oz (73.392 kg) IBW/kg (Calculated) : 73   Vital Signs: Temp: 97.6 F (36.4 C) (05/03 1105) Temp src: Oral (05/03 1105) BP: 156/74 mmHg (05/03 1105) Pulse Rate: 52  (05/03 1105) Intake/Output from previous day: 05/02 0701 - 05/03 0700 In: 1200 [I.V.:1200] Out: 750 [Urine:750] Intake/Output from this shift: Total I/O In: 240 [P.O.:240] Out: -   Labs:  Basename 04/18/12 0650 04/17/12 0635 04/16/12 1608  WBC 7.6 9.2 13.9*  HGB 11.9* 11.4* 12.9*  PLT 208 195 221  LABCREA -- -- --  CREATININE 0.84 0.86 0.92   Estimated Creatinine Clearance: 78.5 ml/min (by C-G formula based on Cr of 0.84). No results found for this basename: VANCOTROUGH:2,VANCOPEAK:2,VANCORANDOM:2,GENTTROUGH:2,GENTPEAK:2,GENTRANDOM:2,TOBRATROUGH:2,TOBRAPEAK:2,TOBRARND:2,AMIKACINPEAK:2,AMIKACINTROU:2,AMIKACIN:2, in the last 72 hours   Microbiology: Recent Results (from the past 720 hour(s))  URINE CULTURE     Status: Normal   Collection Time   04/16/12 10:30 AM      Component Value Range Status Comment   Specimen Description URINE, CATHETERIZED   Final    Special Requests NONE   Final    Culture  Setup Time [id number]   Final    Colony Count >=100,000 COLONIES/ML   Final    Culture     Final    Value: Multiple bacterial morphotypes present, none predominant. Suggest appropriate recollection if clinically indicated.   Report Status 04/18/2012 FINAL   Final   CULTURE, BLOOD (ROUTINE X 2)     Status: Normal   Collection Time   04/16/12 10:35 AM      Component Value Range Status Comment   Specimen Description BLOOD RIGHT ANTECUBITAL   Final    Special Requests BOTTLES DRAWN AEROBIC AND ANAEROBIC 10CC   Final    Culture  Setup Time [id number]   Final    Culture      Final    Value: STAPHYLOCOCCUS SPECIES (COAGULASE NEGATIVE)     Note: THE SIGNIFICANCE OF ISOLATING THIS ORGANISM FROM A SINGLE SET OF BLOOD CULTURES WHEN MULTIPLE SETS ARE DRAWN IS UNCERTAIN. PLEASE NOTIFY THE MICROBIOLOGY DEPARTMENT WITHIN ONE WEEK IF SPECIATION AND SENSITIVITIES ARE REQUIRED.     Note: CRITICAL RESULT CALLED TO, READ BACK BY AND VERIFIED WITH: A. AFATSAWO 04/17/12 11:25 BY GARRS   Report Status 04/18/2012 FINAL   Final   CULTURE, BLOOD (ROUTINE X 2)     Status: Normal (Preliminary result)   Collection Time   04/16/12 10:45 AM      Component Value Range Status Comment   Specimen Description BLOOD LEFT FOREARM   Final    Special Requests BOTTLES DRAWN AEROBIC AND ANAEROBIC 10CC   Final    Culture  Setup Time [id number]   Final    Culture     Final    Value:        BLOOD CULTURE RECEIVED NO GROWTH TO DATE CULTURE WILL BE HELD FOR 5 DAYS BEFORE ISSUING A FINAL NEGATIVE REPORT   Report Status PENDING   Incomplete   MRSA PCR SCREENING     Status: Abnormal   Collection Time   04/16/12  2:40 PM      Component Value Range Status Comment   MRSA by PCR POSITIVE (*) NEGATIVE  Final   CULTURE, EXPECTORATED SPUTUM-ASSESSMENT     Status: Normal   Collection  Time   04/16/12  4:55 PM      Component Value Range Status Comment   Specimen Description SPUTUM   Final    Special Requests NONE   Final    Sputum evaluation     Final    Value: THIS SPECIMEN IS ACCEPTABLE. RESPIRATORY CULTURE REPORT TO FOLLOW.   Report Status 04/16/2012 FINAL   Final   CULTURE, RESPIRATORY     Status: Normal   Collection Time   04/16/12  4:55 PM      Component Value Range Status Comment   Specimen Description SPUTUM   Final    Special Requests NONE   Final    Gram Stain     Final    Value: ABUNDANT WBC PRESENT, PREDOMINANTLY PMN     NO SQUAMOUS EPITHELIAL CELLS SEEN     RARE GRAM POSITIVE COCCI IN PAIRS     RARE GRAM NEGATIVE RODS   Culture NORMAL OROPHARYNGEAL FLORA   Final    Report Status  04/18/2012 FINAL   Final   URINE CULTURE     Status: Normal   Collection Time   04/16/12  5:40 PM      Component Value Range Status Comment   Specimen Description URINE, CATHETERIZED   Final    Special Requests Normal   Final    Culture  Setup Time [id number]   Final    Colony Count 60,000 COLONIES/ML   Final    Culture     Final    Value: Multiple bacterial morphotypes present, none predominant. Suggest appropriate recollection if clinically indicated.   Report Status 04/18/2012 FINAL   Final     Anti-infectives     Start     Dose/Rate Route Frequency Ordered Stop   04/16/12 2200   vancomycin (VANCOCIN) IVPB 1000 mg/200 mL premix        1,000 mg 200 mL/hr over 60 Minutes Intravenous Every 12 hours 04/16/12 1418     04/16/12 1600   cefTRIAXone (ROCEPHIN) 1 g in dextrose 5 % 50 mL IVPB        1 g 100 mL/hr over 30 Minutes Intravenous Every 24 hours 04/16/12 1443 04/23/12 1559   04/16/12 1600   levofloxacin (LEVAQUIN) IVPB 750 mg        750 mg 100 mL/hr over 90 Minutes Intravenous Every 24 hours 04/16/12 1443 04/23/12 1559   04/16/12 1030   vancomycin (VANCOCIN) IVPB 1000 mg/200 mL premix        1,000 mg 200 mL/hr over 60 Minutes Intravenous  Once 04/16/12 1020 04/16/12 1246   04/16/12 1030  piperacillin-tazobactam (ZOSYN) IVPB 3.375 g       3.375 g 12.5 mL/hr over 240 Minutes Intravenous  Once 04/16/12 1020 04/16/12 1459   04/16/12 1030   levofloxacin (LEVAQUIN) IVPB 750 mg        750 mg 100 mL/hr over 90 Minutes Intravenous To [facility] 04/16/12 1020 04/16/12 1433          Assessment: CAP, possible UTI: Cultures only reveal contaminants.  Renal function is stable.  Goal of Therapy:  Vancomycin trough level 15-20 mcg/ml  Plan:  Consider discontinuing Vancomycin and either Levaquin or Ceftriaxone.  He could potentially finish a 7-8 day course of antibiotics with Ceftin 500mg  PO BID or Levaquin 750mg  PO q24h.  Estella Husk, Pharm.D., BCPS Clinical  Pharmacist  Pager [phone] 04/19/2012, 2:22 PM

## 2012-04-19 NOTE — Progress Notes (Signed)
Subjective: No new complaints. Reports his cough is better.   Objective: Weight change:   Intake/Output Summary (Last 24 hours) at 04/19/12 1906 Last data filed at 04/19/12 1808  Gross per 24 hour  Intake   1440 ml  Output   2800 ml  Net  -1360 ml    Filed Vitals:   04/19/12 1806  BP: 141/68  Pulse: 50  Temp: 97.4 F (36.3 C)  Resp: 20   Physical Exam:  General: No acute respiratory distress - A&O x4  Lungs: crackles in R base - no wheeze - CTA th/o other fields  Cardiovascular: Regular rate and rhythm without murmur gallop or rub normal S1 and S2  Abdomen: Nontender, nondistended, soft, bowel sounds positive, no rebound, no ascites, no appreciable mass  Extremities: No significant cyanosis, clubbing, or edema bilateral lower extremities   Lab Results: Results for orders placed during the hospital encounter of 04/16/12 (from the past 24 hour(s))  GLUCOSE, CAPILLARY     Status: Abnormal   Collection Time   04/18/12 11:26 PM      Component Value Range   Glucose-Capillary 124 (*) 70 - 99 (mg/dL)   Comment 1 Notify RN    GLUCOSE, CAPILLARY     Status: Normal   Collection Time   04/19/12  7:27 AM      Component Value Range   Glucose-Capillary 94  70 - 99 (mg/dL)   Comment 1 Documented in Chart     Comment 2 Notify RN    GLUCOSE, CAPILLARY     Status: Abnormal   Collection Time   04/19/12 12:08 PM      Component Value Range   Glucose-Capillary 115 (*) 70 - 99 (mg/dL)  GLUCOSE, CAPILLARY     Status: Abnormal   Collection Time   04/19/12  4:32 PM      Component Value Range   Glucose-Capillary 102 (*) 70 - 99 (mg/dL)     Micro Results: Recent Results (from the past 240 hour(s))  URINE CULTURE     Status: Normal   Collection Time   04/16/12 10:30 AM      Component Value Range Status Comment   Specimen Description URINE, CATHETERIZED   Final    Special Requests NONE   Final    Culture  Setup Time [id number]   Final    Colony Count >=100,000 COLONIES/ML   Final    Culture     Final    Value: Multiple bacterial morphotypes present, none predominant. Suggest appropriate recollection if clinically indicated.   Report Status 04/18/2012 FINAL   Final   CULTURE, BLOOD (ROUTINE X 2)     Status: Normal   Collection Time   04/16/12 10:35 AM      Component Value Range Status Comment   Specimen Description BLOOD RIGHT ANTECUBITAL   Final    Special Requests BOTTLES DRAWN AEROBIC AND ANAEROBIC 10CC   Final    Culture  Setup Time [id number]   Final    Culture     Final    Value: STAPHYLOCOCCUS SPECIES (COAGULASE NEGATIVE)     Note: THE SIGNIFICANCE OF ISOLATING THIS ORGANISM FROM A SINGLE SET OF BLOOD CULTURES WHEN MULTIPLE SETS ARE DRAWN IS UNCERTAIN. PLEASE NOTIFY THE MICROBIOLOGY DEPARTMENT WITHIN ONE WEEK IF SPECIATION AND SENSITIVITIES ARE REQUIRED.     Note: CRITICAL RESULT CALLED TO, READ BACK BY AND VERIFIED WITH: A. AFATSAWO 04/17/12 11:25 BY GARRS   Report Status 04/18/2012 FINAL   Final   CULTURE,  BLOOD (ROUTINE X 2)     Status: Normal (Preliminary result)   Collection Time   04/16/12 10:45 AM      Component Value Range Status Comment   Specimen Description BLOOD LEFT FOREARM   Final    Special Requests BOTTLES DRAWN AEROBIC AND ANAEROBIC 10CC   Final    Culture  Setup Time [id number]   Final    Culture     Final    Value:        BLOOD CULTURE RECEIVED NO GROWTH TO DATE CULTURE WILL BE HELD FOR 5 DAYS BEFORE ISSUING A FINAL NEGATIVE REPORT   Report Status PENDING   Incomplete   MRSA PCR SCREENING     Status: Abnormal   Collection Time   04/16/12  2:40 PM      Component Value Range Status Comment   MRSA by PCR POSITIVE (*) NEGATIVE  Final   CULTURE, EXPECTORATED SPUTUM-ASSESSMENT     Status: Normal   Collection Time   04/16/12  4:55 PM      Component Value Range Status Comment   Specimen Description SPUTUM   Final    Special Requests NONE   Final    Sputum evaluation     Final    Value: THIS SPECIMEN IS ACCEPTABLE. RESPIRATORY CULTURE REPORT TO  FOLLOW.   Report Status 04/16/2012 FINAL   Final   CULTURE, RESPIRATORY     Status: Normal   Collection Time   04/16/12  4:55 PM      Component Value Range Status Comment   Specimen Description SPUTUM   Final    Special Requests NONE   Final    Gram Stain     Final    Value: ABUNDANT WBC PRESENT, PREDOMINANTLY PMN     NO SQUAMOUS EPITHELIAL CELLS SEEN     RARE GRAM POSITIVE COCCI IN PAIRS     RARE GRAM NEGATIVE RODS   Culture NORMAL OROPHARYNGEAL FLORA   Final    Report Status 04/18/2012 FINAL   Final   URINE CULTURE     Status: Normal   Collection Time   04/16/12  5:40 PM      Component Value Range Status Comment   Specimen Description URINE, CATHETERIZED   Final    Special Requests Normal   Final    Culture  Setup Time [id number]   Final    Colony Count 60,000 COLONIES/ML   Final    Culture     Final    Value: Multiple bacterial morphotypes present, none predominant. Suggest appropriate recollection if clinically indicated.   Report Status 04/18/2012 FINAL   Final     Studies/Results: Dg Chest 2 View  04/18/2012  *RADIOLOGY REPORT*  Clinical Data: Cough, pneumonia  CHEST - 2 VIEW  Comparison: 04/18/2012  Findings: Enlargement of cardiac silhouette. Atherosclerotic calcification aorta. Pulmonary vascular congestion. Skin fold projects over right lung. Persistent increased right perihilar and basilar markings little changed. Question atelectasis left base. No gross pleural effusion or pneumothorax. Multiple old left rib fractures. Advanced bilateral glenohumeral degenerative changes.  IMPRESSION: Enlargement of cardiac silhouette with pulmonary vascular congestion. Persistent right perihilar to basilar opacity question infiltrate. Suspect atelectasis left base.  Original Report Authenticated By: Lollie Marrow, M.D.   Dg Chest Port 1 View  04/18/2012  *RADIOLOGY REPORT*  Clinical Data: Infiltrates.  PORTABLE CHEST - 1 VIEW  Comparison: 04/16/2012.  Findings: Heart is enlarged, stable.   Thoracic aorta is calcified. There is right perihilar and right lower  lobe air space opacification.  Linear densities are seen in the left lower lobe. No pleural fluid.  Multiple left rib fractures.  Degenerative changes are seen in the shoulders.  IMPRESSION: Right perihilar and right lower lobe air space opacification, stable, and possibly due to pneumonia.  Centrally obstructing lesion cannot be excluded.  Follow-up to clearing is recommended.  Original Report Authenticated By: Reyes Ivan, M.D.   Dg Chest Portable 1 View  04/16/2012  *RADIOLOGY REPORT*  Clinical Data: Altered mental status.  PORTABLE CHEST - 1 VIEW  Comparison: Chest x-ray 09/20/2010.  Findings: Compared to the prior examination, there is interval development of patchy multifocal airspace disease throughout the right mid and lower lung, favored to reflect right lower lobe pneumonia.  No definite pleural effusions.  Pulmonary vasculature is within normal limits.  Heart size is mildly enlarged (unchanged).  Atherosclerotic calcification within the arch of the aorta.  Multiple old healed left-sided rib fractures are again noted.  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  IMPRESSION: 1.  Findings concerning for right lower lobe pneumonia.  In the setting of altered mental status, these findings could alternatively reflect a large volume aspiration.  Clinical correlation is recommended. 2.  Atherosclerosis.  Original Report Authenticated By: Florencia Reasons, M.D.   Medications: Scheduled Meds:   . aspirin EC  81 mg Oral Daily  . carbidopa-levodopa  1 tablet Oral TID  . cefTRIAXone (ROCEPHIN)  IV  1 g Intravenous Q24H  . Chlorhexidine Gluconate Cloth  6 each Topical Q0600  . enoxaparin  40 mg Subcutaneous QHS  . entacapone  200 mg Oral TID  . insulin aspart  0-5 Units Subcutaneous QHS  . insulin aspart  0-9 Units Subcutaneous BID AC  . lamoTRIgine  150 mg Oral BID  . levofloxacin  (LEVAQUIN) IV  750 mg Intravenous Q24H  . lithium carbonate  300 mg Oral BID WC  . LORazepam  0.5 mg Oral Q1200  . mulitivitamin with minerals  1 tablet Oral Daily  . mupirocin ointment  1 application Nasal BID  . pantoprazole  40 mg Oral Q1200  . sertraline  100 mg Oral Daily  . vancomycin  1,000 mg Intravenous Q12H   Continuous Infusions:   . sodium chloride 100 mL/hr at 04/19/12 0700   PRN Meds:.acetaminophen, ondansetron, oxyCODONE, temazepam  Assessment/Plan: Patient Active Hospital Problem List: Toxic Metabolic Encephalopathy  Resolved - due to acute infectious illness in the setting of a chronic neurodegenerative disease  UTI/Pyelonephritis  Urine culture shows 100,000 colonies and multiple bacterial morphotypes. Continue with abx.  RLL CAP  No concerns noted by SLP during bedside swallow eval - if pt did have an aspiration event, it was likely an isolated single event, perhaps brought about by his acute illness and mental status changes -his repeat CXR shows persistent pneumonia. His sputum cultures have been negative so far. - cont empiric abx coverage for 48 more hours. 1/2 + Blood Cx - gram + cocci in clusters (nasal swab MRSA +)  may well prove to be staph epi (a probable contaminant) but can't rule out more pathogenic organisms until culture complete - is on vanc at present . Will repeat cxr in am.  Hypotension  Resolved - BP well controlled and in normal range - likely due to volume depletion in setting of acute infectious illness  Very mildly elevated troponin  Resolved - not likely reflective of significant acute cardiac event - echo to be accomplished to r/o  a focal WMA  Parkinson's Disease  Continue home meds - no acute complicating factors  Bipolar D/O  Appears stable at this time  DM  Reasonably controlled at the present time - cont to follow trend - A1c at goal  HTN  Not an active problem at this time   LOS: 3 days   [name] 04/19/2012, 7:06 PM

## 2012-04-20 DIAGNOSIS — E782 Mixed hyperlipidemia: Secondary | ICD-10-CM

## 2012-04-20 DIAGNOSIS — G20 Parkinson's disease: Secondary | ICD-10-CM

## 2012-04-20 DIAGNOSIS — I959 Hypotension, unspecified: Secondary | ICD-10-CM

## 2012-04-20 DIAGNOSIS — D696 Thrombocytopenia, unspecified: Secondary | ICD-10-CM

## 2012-04-20 LAB — GLUCOSE, CAPILLARY
Glucose-Capillary: 113 mg/dL — ABNORMAL HIGH (ref 70–99)
Glucose-Capillary: 92 mg/dL (ref 70–99)
Glucose-Capillary: 104 mg/dL — ABNORMAL HIGH (ref 70–99)

## 2012-04-20 NOTE — Progress Notes (Signed)
Subjective:  no new complaints. Feels good.   Objective: Weight change:   Intake/Output Summary (Last 24 hours) at 04/20/12 1008 Last data filed at 04/20/12 4098  Gross per 24 hour  Intake   1320 ml  Output   3625 ml  Net  -2305 ml    Filed Vitals:   04/20/12 0657  BP: 154/74  Pulse: 48  Temp: 98.3 F (36.8 C)  Resp: 18  Physical Exam:  General: No acute respiratory distress - A&O x4  Lungs: GOOD AIR ENTRY bilateral. - no wheeze - CTA th/o other fields  Cardiovascular: Regular rate and rhythm without murmur gallop or rub normal S1 and S2  Abdomen: Nontender, nondistended, soft, bowel sounds positive, no rebound, no ascites, no appreciable mass  Extremities: No significant cyanosis, clubbing, or edema bilateral lower extremities    Lab Results: Results for orders placed during the hospital encounter of 04/16/12 (from the past 24 hour(s))  GLUCOSE, CAPILLARY     Status: Abnormal   Collection Time   04/19/12 12:08 PM      Component Value Range   Glucose-Capillary 115 (*) 70 - 99 (mg/dL)  GLUCOSE, CAPILLARY     Status: Abnormal   Collection Time   04/19/12  4:32 PM      Component Value Range   Glucose-Capillary 102 (*) 70 - 99 (mg/dL)  GLUCOSE, CAPILLARY     Status: Abnormal   Collection Time   04/19/12  9:28 PM      Component Value Range   Glucose-Capillary 113 (*) 70 - 99 (mg/dL)   Comment 1 Documented in Chart     Comment 2 Notify RN    GLUCOSE, CAPILLARY     Status: Normal   Collection Time   04/20/12  6:54 AM      Component Value Range   Glucose-Capillary 92  70 - 99 (mg/dL)   Comment 1 Notify RN     Comment 2 Documented in Chart           Micro Results: Recent Results (from the past 240 hour(s))  URINE CULTURE     Status: Normal   Collection Time   04/16/12 10:30 AM      Component Value Range Status Comment   Specimen Description URINE, CATHETERIZED   Final    Special Requests NONE   Final    Culture  Setup Time [id number]   Final    Colony Count  >=100,000 COLONIES/ML   Final    Culture     Final    Value: Multiple bacterial morphotypes present, none predominant. Suggest appropriate recollection if clinically indicated.   Report Status 04/18/2012 FINAL   Final   CULTURE, BLOOD (ROUTINE X 2)     Status: Normal   Collection Time   04/16/12 10:35 AM      Component Value Range Status Comment   Specimen Description BLOOD RIGHT ANTECUBITAL   Final    Special Requests BOTTLES DRAWN AEROBIC AND ANAEROBIC 10CC   Final    Culture  Setup Time [id number]   Final    Culture     Final    Value: STAPHYLOCOCCUS SPECIES (COAGULASE NEGATIVE)     Note: THE SIGNIFICANCE OF ISOLATING THIS ORGANISM FROM A SINGLE SET OF BLOOD CULTURES WHEN MULTIPLE SETS ARE DRAWN IS UNCERTAIN. PLEASE NOTIFY THE MICROBIOLOGY DEPARTMENT WITHIN ONE WEEK IF SPECIATION AND SENSITIVITIES ARE REQUIRED.     Note: CRITICAL RESULT CALLED TO, READ BACK BY AND VERIFIED WITH: A. AFATSAWO 04/17/12 11:25 BY  GARRS   Report Status 04/18/2012 FINAL   Final   CULTURE, BLOOD (ROUTINE X 2)     Status: Normal (Preliminary result)   Collection Time   04/16/12 10:45 AM      Component Value Range Status Comment   Specimen Description BLOOD LEFT FOREARM   Final    Special Requests BOTTLES DRAWN AEROBIC AND ANAEROBIC 10CC   Final    Culture  Setup Time [id number]   Final    Culture     Final    Value:        BLOOD CULTURE RECEIVED NO GROWTH TO DATE CULTURE WILL BE HELD FOR 5 DAYS BEFORE ISSUING A FINAL NEGATIVE REPORT   Report Status PENDING   Incomplete   MRSA PCR SCREENING     Status: Abnormal   Collection Time   04/16/12  2:40 PM      Component Value Range Status Comment   MRSA by PCR POSITIVE (*) NEGATIVE  Final   CULTURE, EXPECTORATED SPUTUM-ASSESSMENT     Status: Normal   Collection Time   04/16/12  4:55 PM      Component Value Range Status Comment   Specimen Description SPUTUM   Final    Special Requests NONE   Final    Sputum evaluation     Final    Value: THIS SPECIMEN IS  ACCEPTABLE. RESPIRATORY CULTURE REPORT TO FOLLOW.   Report Status 04/16/2012 FINAL   Final   CULTURE, RESPIRATORY     Status: Normal   Collection Time   04/16/12  4:55 PM      Component Value Range Status Comment   Specimen Description SPUTUM   Final    Special Requests NONE   Final    Gram Stain     Final    Value: ABUNDANT WBC PRESENT, PREDOMINANTLY PMN     NO SQUAMOUS EPITHELIAL CELLS SEEN     RARE GRAM POSITIVE COCCI IN PAIRS     RARE GRAM NEGATIVE RODS   Culture NORMAL OROPHARYNGEAL FLORA   Final    Report Status 04/18/2012 FINAL   Final   URINE CULTURE     Status: Normal   Collection Time   04/16/12  5:40 PM      Component Value Range Status Comment   Specimen Description URINE, CATHETERIZED   Final    Special Requests Normal   Final    Culture  Setup Time [id number]   Final    Colony Count 60,000 COLONIES/ML   Final    Culture     Final    Value: Multiple bacterial morphotypes present, none predominant. Suggest appropriate recollection if clinically indicated.   Report Status 04/18/2012 FINAL   Final     Studies/Results: Dg Chest 2 View  04/18/2012  *RADIOLOGY REPORT*  Clinical Data: Cough, pneumonia  CHEST - 2 VIEW  Comparison: 04/18/2012  Findings: Enlargement of cardiac silhouette. Atherosclerotic calcification aorta. Pulmonary vascular congestion. Skin fold projects over right lung. Persistent increased right perihilar and basilar markings little changed. Question atelectasis left base. No gross pleural effusion or pneumothorax. Multiple old left rib fractures. Advanced bilateral glenohumeral degenerative changes.  IMPRESSION: Enlargement of cardiac silhouette with pulmonary vascular congestion. Persistent right perihilar to basilar opacity question infiltrate. Suspect atelectasis left base.  Original Report Authenticated By: Lollie Marrow, M.D.   Dg Chest Port 1 View  04/18/2012  *RADIOLOGY REPORT*  Clinical Data: Infiltrates.  PORTABLE CHEST - 1 VIEW  Comparison:  04/16/2012.  Findings: Heart is enlarged,  stable.  Thoracic aorta is calcified. There is right perihilar and right lower lobe air space opacification.  Linear densities are seen in the left lower lobe. No pleural fluid.  Multiple left rib fractures.  Degenerative changes are seen in the shoulders.  IMPRESSION: Right perihilar and right lower lobe air space opacification, stable, and possibly due to pneumonia.  Centrally obstructing lesion cannot be excluded.  Follow-up to clearing is recommended.  Original Report Authenticated By: Reyes Ivan, M.D.   Dg Chest Portable 1 View  04/16/2012  *RADIOLOGY REPORT*  Clinical Data: Altered mental status.  PORTABLE CHEST - 1 VIEW  Comparison: Chest x-ray 09/20/2010.  Findings: Compared to the prior examination, there is interval development of patchy multifocal airspace disease throughout the right mid and lower lung, favored to reflect right lower lobe pneumonia.  No definite pleural effusions.  Pulmonary vasculature is within normal limits.  Heart size is mildly enlarged (unchanged).  Atherosclerotic calcification within the arch of the aorta.  Multiple old healed left-sided rib fractures are again noted.  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  IMPRESSION: 1.  Findings concerning for right lower lobe pneumonia.  In the setting of altered mental status, these findings could alternatively reflect a large volume aspiration.  Clinical correlation is recommended. 2.  Atherosclerosis.  Original Report Authenticated By: Florencia Reasons, M.D.   Medications: Scheduled Meds:   . aspirin EC  81 mg Oral Daily  . carbidopa-levodopa  1 tablet Oral TID  . cefTRIAXone (ROCEPHIN)  IV  1 g Intravenous Q24H  . Chlorhexidine Gluconate Cloth  6 each Topical Q0600  . enoxaparin  40 mg Subcutaneous QHS  . entacapone  200 mg Oral TID  . insulin aspart  0-5 Units Subcutaneous QHS  . insulin aspart  0-9 Units Subcutaneous BID  AC  . lamoTRIgine  150 mg Oral BID  . levofloxacin (LEVAQUIN) IV  750 mg Intravenous Q24H  . lithium carbonate  300 mg Oral BID WC  . LORazepam  0.5 mg Oral Q1200  . mulitivitamin with minerals  1 tablet Oral Daily  . mupirocin ointment  1 application Nasal BID  . pantoprazole  40 mg Oral Q1200  . sertraline  100 mg Oral Daily  . vancomycin  1,000 mg Intravenous Q12H   Continuous Infusions:   . sodium chloride 100 mL/hr at 04/20/12 0600   PRN Meds:.acetaminophen, ondansetron, oxyCODONE, temazepam  Assessment/Plan: Patient Active Hospital Problem List: Toxic Metabolic Encephalopathy  Resolved - due to acute infectious illness in the setting of a chronic neurodegenerative disease  UTI/Pyelonephritis  Urine culture shows 100,000 colonies and multiple bacterial morphotypes. Continue with abx. Will repeat urine cultures today.  RLL CAP  No concerns noted by SLP during bedside swallow eval - if pt did have an aspiration event, it was likely an isolated single event, perhaps brought about by his acute illness and mental status changes -his repeat CXR shows persistent pneumonia. His sputum cultures have been negative so far. - cont empiric abx coverage and repeat CXR in am.  1/2 + Blood Cx - gram + cocci in clusters (nasal swab MRSA +)  may well prove to be staph epi (a probable contaminant) but can't rule out more pathogenic organisms until culture complete - is on vanc at present . Will repeat cxr in am.  Hypotension  Resolved - BP well controlled and in normal range - likely due to volume depletion in setting of acute infectious illness  Very mildly  elevated troponin  Resolved - not likely reflective of significant acute cardiac event - echo to be accomplished to r/o a focal WMA  Parkinson's Disease  Continue home meds - no acute complicating factors  Bipolar D/O  Appears stable at this time  DM  Reasonably controlled at the present time - cont to follow trend - A1c at goal  HTN  Not  an active problem at this time Bradycardia; asymptomatic.   Disposition: possibly d/c in am if cxr shows resolving pneumonia.   LOS: 4 days   Gerhardt Gleed 04/20/2012, 10:08 AM

## 2012-04-21 ENCOUNTER — Inpatient Hospital Stay (HOSPITAL_COMMUNITY): Payer: Medicare Other

## 2012-04-21 DIAGNOSIS — I959 Hypotension, unspecified: Secondary | ICD-10-CM

## 2012-04-21 DIAGNOSIS — D696 Thrombocytopenia, unspecified: Secondary | ICD-10-CM

## 2012-04-21 DIAGNOSIS — E782 Mixed hyperlipidemia: Secondary | ICD-10-CM

## 2012-04-21 DIAGNOSIS — G20 Parkinson's disease: Secondary | ICD-10-CM

## 2012-04-21 MED ORDER — MOXIFLOXACIN HCL 400 MG PO TABS: 400.0000 mg | ORAL_TABLET | Freq: Every day | ORAL | Status: AC

## 2012-04-21 NOTE — Progress Notes (Signed)
Pt sent home with caretaker and foley cathater. All medications and follow up appointments clarified.m Casimiro Needle RN

## 2012-04-21 NOTE — Discharge Summary (Signed)
DISCHARGE SUMMARY  MCCOY TESTA  MR#: [id number]  DOB:05/06/1935  Date of Admission: 04/16/2012 Date of Discharge: 04/21/2012  Attending Physician:Kerney Hopfensperger  Patient's WUJ:WJXBJYN,WGN, MD, MD  Consults: none  Discharge Diagnoses: Present on Admission:  .SIRS (systemic inflammatory response syndrome) .Hypotension .Toxic metabolic encephalopathy .UTI (lower urinary tract infection) .Community acquired pneumonia .Parkinson disease .Diabetes mellitus .Elevated troponin .Acute respiratory failure with hypoxia .Bipolar 1 disorder .Leukocytosis   Initial presentation:  77 year old male with history of Parkinson's, diabetes, hypertension, bipolar disorder was brought to the [facility] ED by his home health nurse for altered mental status. History was obtained from a home health nurse. Patient appears to be significantly confused although alert and awake. Per the patient's nurse, patient has intermittent home health and supervision at home, he is by himself at night until she arrives at 9 AM in the morning. When she came to his house, she noticed him laying on his bed with the legs dangling down. Patient appeared to be extremely confused and was unable to provide any history. She noticed that patient was extremely weak and unable to get up from the bed and called EMS.Marland Kitchen   Hospital Course: Toxic Metabolic Encephalopathy  Resolved - due to acute infectious illness in the setting of a chronic neurodegenerative disease   UTI/Pyelonephritis  Urine culture shows 1,00,000 colonies and multiple bacterial morphotypes. Continue with avelox for 7 more days to complete the course.  RLL CAP  No concerns noted by SLP during bedside swallow eval - if pt did have an aspiration event, it was likely an isolated single event, perhaps brought about by his acute illness and mental status changes. Repeat CXR showed resolution of pneumonia.  Continue with avelox for 7 more days to complete the course.    1/2 + Blood Cx - gram + cocci in clusters: probably contaminant. Hypotension  Resolved - BP well controlled and in normal range - likely due to volume depletion in setting of acute infectious illness  Very mildly elevated troponin  Resolved - not likely reflective of significant acute cardiac event - echo to be accomplished to r/o a focal WMA  Parkinson's Disease  Continue home meds - no acute complicating factors  Bipolar D/O  Appears stable at this time  DM  Reasonably controlled at the present time - cont to follow trend - A1c at goal  HTN  Controlled.     Medication List  As of 04/21/2012  9:49 AM   TAKE these medications         amLODipine 5 MG tablet   Commonly known as: NORVASC   Take 5 mg by mouth daily.      aspirin 81 MG tablet   Take 81 mg by mouth daily.      carbidopa-levodopa 25-250 MG per tablet   Commonly known as: SINEMET IR   Take 1 tablet by mouth 3 (three) times daily.      clotrimazole-betamethasone cream   Commonly known as: LOTRISONE   Apply 1 application topically 2 (two) times daily.      entacapone 200 MG tablet   Commonly known as: COMTAN   Take 200 mg by mouth 3 (three) times daily.      hydrochlorothiazide 25 MG tablet   Commonly known as: HYDRODIURIL   Take 25 mg by mouth daily.      lamoTRIgine 150 MG tablet   Commonly known as: LAMICTAL   Take 150 mg by mouth 2 (two) times daily.      lithium  carbonate 300 MG capsule   Take 1 capsule (300 mg total) by mouth 2 (two) times daily with a meal.      LORazepam 0.5 MG tablet   Commonly known as: ATIVAN   Take 1 tablet (0.5 mg total) by mouth daily at 12 noon.      metFORMIN 500 MG tablet   Commonly known as: GLUCOPHAGE   Take 500 mg by mouth 2 (two) times daily with a meal.      moxifloxacin 400 MG tablet   Commonly known as: AVELOX   Take 1 tablet (400 mg total) by mouth daily.      multivitamins ther. w/minerals Tabs   Take 1 tablet by mouth daily.      omeprazole 20 MG  capsule   Commonly known as: PRILOSEC   Take 20 mg by mouth daily.      potassium chloride SA 20 MEQ tablet   Commonly known as: K-DUR,KLOR-CON   Take 20 mEq by mouth daily.      ramipril 5 MG tablet   Commonly known as: ALTACE   Take 5 mg by mouth 2 (two) times daily.      RED YEAST RICE PO   Take 2 tablets by mouth at bedtime.      sertraline 100 MG tablet   Commonly known as: ZOLOFT   Take 1 tablet (100 mg total) by mouth daily.      temazepam 30 MG capsule   Commonly known as: RESTORIL   Take 1 capsule (30 mg total) by mouth at bedtime as needed for sleep.             Day of Discharge BP 176/72  Pulse 51  Temp(Src) 99.3 F (37.4 C) (Oral)  Resp 18  Ht 5\' 10"  (1.778 m)  Wt 73.392 kg (161 lb 12.8 oz)  BMI 23.22 kg/m2  SpO2 99%  Physical Exam: Physical Exam:  General: No acute respiratory distress - A&O x4  Lungs: GOOD AIR ENTRY bilateral. - no wheeze - CTA th/o other fields  Cardiovascular: Regular rate and rhythm without murmur gallop or rub normal S1 and S2  Abdomen: Nontender, nondistended, soft, bowel sounds positive, no rebound, no ascites, no appreciable mass  Extremities: No significant cyanosis, clubbing, or edema bilateral lower extremities   Results for orders placed during the hospital encounter of 04/16/12 (from the past 24 hour(s))  GLUCOSE, CAPILLARY     Status: Abnormal   Collection Time   04/20/12 11:18 AM      Component Value Range   Glucose-Capillary 116 (*) 70 - 99 (mg/dL)   Comment 1 Notify RN     Comment 2 Documented in Chart    GLUCOSE, CAPILLARY     Status: Abnormal   Collection Time   04/20/12  4:29 PM      Component Value Range   Glucose-Capillary 104 (*) 70 - 99 (mg/dL)   Comment 1 Notify RN     Comment 2 Documented in Chart    GLUCOSE, CAPILLARY     Status: Normal   Collection Time   04/20/12 10:08 PM      Component Value Range   Glucose-Capillary 93  70 - 99 (mg/dL)   Comment 1 Documented in Chart     Comment 2 Notify RN     GLUCOSE, CAPILLARY     Status: Normal   Collection Time   04/21/12  7:36 AM      Component Value Range   Glucose-Capillary 79  70 -  99 (mg/dL)   Comment 1 Notify RN     Comment 2 Documented in Chart      Disposition: Home   Follow-up Appts: Discharge Orders    Future Orders Please Complete By Expires   Diet - low sodium heart healthy      Discharge instructions      Comments:   Follow up with PCP in 2 weeks.   Activity as tolerated - No restrictions            Time spent in discharge (includes decision making & examination of pt): 55 minutes  Signed: Hanad Leino 04/21/2012, 9:49 AM

## 2012-04-22 LAB — GLUCOSE, CAPILLARY: Glucose-Capillary: 91 mg/dL (ref 70–99)

## 2012-04-22 LAB — CULTURE, BLOOD (ROUTINE X 2)

## 2012-04-29 ENCOUNTER — Ambulatory Visit (HOSPITAL_COMMUNITY): Payer: Self-pay | Admitting: Psychiatry

## 2012-05-07 ENCOUNTER — Other Ambulatory Visit (HOSPITAL_COMMUNITY): Payer: Self-pay | Admitting: Psychiatry

## 2012-05-07 DIAGNOSIS — F319 Bipolar disorder, unspecified: Secondary | ICD-10-CM

## 2012-05-07 MED ORDER — TEMAZEPAM 30 MG PO CAPS: 30.0000 mg | ORAL_CAPSULE | Freq: Every evening | ORAL | Status: DC | PRN

## 2012-05-17 ENCOUNTER — Other Ambulatory Visit: Payer: Self-pay | Admitting: Neurology

## 2012-05-17 DIAGNOSIS — R269 Unspecified abnormalities of gait and mobility: Secondary | ICD-10-CM

## 2012-05-17 DIAGNOSIS — G20 Parkinson's disease: Secondary | ICD-10-CM

## 2012-05-17 DIAGNOSIS — F23 Brief psychotic disorder: Secondary | ICD-10-CM

## 2012-05-24 ENCOUNTER — Ambulatory Visit
Admission: RE | Admit: 2012-05-24 | Discharge: 2012-05-24 | Disposition: A | Discharge status: Home / Self Care | Payer: Medicare Other | Source: Ambulatory Visit | Attending: Neurology | Admitting: Neurology

## 2012-05-24 DIAGNOSIS — G20 Parkinson's disease: Secondary | ICD-10-CM

## 2012-05-24 DIAGNOSIS — F23 Brief psychotic disorder: Secondary | ICD-10-CM

## 2012-05-24 DIAGNOSIS — R269 Unspecified abnormalities of gait and mobility: Secondary | ICD-10-CM

## 2012-05-27 ENCOUNTER — Other Ambulatory Visit (HOSPITAL_COMMUNITY): Payer: Self-pay | Admitting: Psychiatry

## 2012-05-27 DIAGNOSIS — F319 Bipolar disorder, unspecified: Secondary | ICD-10-CM

## 2012-05-28 ENCOUNTER — Other Ambulatory Visit: Payer: Self-pay | Admitting: Neurology

## 2012-05-28 DIAGNOSIS — R269 Unspecified abnormalities of gait and mobility: Secondary | ICD-10-CM

## 2012-05-28 DIAGNOSIS — G20 Parkinson's disease: Secondary | ICD-10-CM

## 2012-05-28 DIAGNOSIS — R410 Disorientation, unspecified: Secondary | ICD-10-CM

## 2012-05-29 ENCOUNTER — Inpatient Hospital Stay
Admission: RE | Admit: 2012-05-29 | Discharge: 2012-05-29 | Payer: Self-pay | Source: Ambulatory Visit | Attending: Neurology | Admitting: Neurology

## 2012-05-29 DIAGNOSIS — G20 Parkinson's disease: Secondary | ICD-10-CM

## 2012-05-29 DIAGNOSIS — G20A1 Parkinson's disease without dyskinesia, without mention of fluctuations: Secondary | ICD-10-CM

## 2012-05-29 NOTE — Discharge Instructions (Signed)

## 2012-05-31 ENCOUNTER — Ambulatory Visit
Admission: RE | Admit: 2012-05-31 | Discharge: 2012-05-31 | Disposition: A | Discharge status: Home / Self Care | Payer: Medicare Other | Source: Ambulatory Visit | Attending: Neurology | Admitting: Neurology

## 2012-05-31 VITALS — BP 121/63 | HR 48

## 2012-05-31 DIAGNOSIS — G20 Parkinson's disease: Secondary | ICD-10-CM

## 2012-05-31 DIAGNOSIS — R269 Unspecified abnormalities of gait and mobility: Secondary | ICD-10-CM

## 2012-05-31 DIAGNOSIS — R410 Disorientation, unspecified: Secondary | ICD-10-CM

## 2012-05-31 LAB — PROTEIN, CSF: Total Protein, CSF: 77 mg/dL — ABNORMAL HIGH (ref 15–45)

## 2012-05-31 LAB — CSF CELL COUNT WITH DIFFERENTIAL
RBC Count, CSF: 2650 cu mm — ABNORMAL HIGH
WBC, CSF: 3 cu mm (ref 0–5)

## 2012-06-01 LAB — CRYPTOCOCCAL ANTIGEN, CSF: Crypto Ag: NEGATIVE

## 2012-06-03 LAB — VDRL, CSF

## 2012-06-04 ENCOUNTER — Inpatient Hospital Stay (HOSPITAL_COMMUNITY): Payer: Medicare Other

## 2012-06-04 ENCOUNTER — Other Ambulatory Visit: Payer: Self-pay | Admitting: Neurology

## 2012-06-04 ENCOUNTER — Inpatient Hospital Stay (HOSPITAL_COMMUNITY)
Admission: AD | Admit: 2012-06-04 | Discharge: 2012-06-07 | DRG: 690 | Disposition: A | Discharge status: Skilled Nursing Facility | Payer: Medicare Other | Source: Ambulatory Visit | Attending: Neurology | Admitting: Neurology

## 2012-06-04 DIAGNOSIS — G20 Parkinson's disease: Secondary | ICD-10-CM | POA: Diagnosis present

## 2012-06-04 DIAGNOSIS — E119 Type 2 diabetes mellitus without complications: Secondary | ICD-10-CM | POA: Diagnosis present

## 2012-06-04 DIAGNOSIS — R4182 Altered mental status, unspecified: Secondary | ICD-10-CM | POA: Diagnosis present

## 2012-06-04 DIAGNOSIS — N390 Urinary tract infection, site not specified: Principal | ICD-10-CM | POA: Diagnosis present

## 2012-06-04 DIAGNOSIS — G20A1 Parkinson's disease without dyskinesia, without mention of fluctuations: Secondary | ICD-10-CM | POA: Diagnosis present

## 2012-06-04 DIAGNOSIS — Z96649 Presence of unspecified artificial hip joint: Secondary | ICD-10-CM

## 2012-06-04 DIAGNOSIS — Z818 Family history of other mental and behavioral disorders: Secondary | ICD-10-CM

## 2012-06-04 DIAGNOSIS — F0390 Unspecified dementia without behavioral disturbance: Secondary | ICD-10-CM

## 2012-06-04 DIAGNOSIS — F319 Bipolar disorder, unspecified: Secondary | ICD-10-CM | POA: Diagnosis present

## 2012-06-04 DIAGNOSIS — Z974 Presence of external hearing-aid: Secondary | ICD-10-CM

## 2012-06-04 DIAGNOSIS — F064 Anxiety disorder due to known physiological condition: Secondary | ICD-10-CM | POA: Diagnosis present

## 2012-06-04 DIAGNOSIS — I10 Essential (primary) hypertension: Secondary | ICD-10-CM | POA: Diagnosis present

## 2012-06-04 DIAGNOSIS — Z79899 Other long term (current) drug therapy: Secondary | ICD-10-CM

## 2012-06-04 DIAGNOSIS — Z7982 Long term (current) use of aspirin: Secondary | ICD-10-CM

## 2012-06-04 DIAGNOSIS — A4901 Methicillin susceptible Staphylococcus aureus infection, unspecified site: Secondary | ICD-10-CM | POA: Diagnosis present

## 2012-06-04 DIAGNOSIS — R569 Unspecified convulsions: Secondary | ICD-10-CM

## 2012-06-04 LAB — CBC
HCT: 40.8 % (ref 39.0–52.0)
Hemoglobin: 13.3 g/dL (ref 13.0–17.0)
MCH: 30.6 pg (ref 26.0–34.0)
MCHC: 32.6 g/dL (ref 30.0–36.0)
MCV: 94 fL (ref 78.0–100.0)

## 2012-06-04 LAB — GLUCOSE, CAPILLARY: Glucose-Capillary: 125 mg/dL — ABNORMAL HIGH (ref 70–99)

## 2012-06-04 LAB — MRSA PCR SCREENING: MRSA by PCR: NEGATIVE

## 2012-06-04 LAB — SEDIMENTATION RATE: Sed Rate: 1 mm/hr (ref 0–16)

## 2012-06-04 LAB — LITHIUM LEVEL: Lithium Lvl: 1.07 mEq/L (ref 0.80–1.40)

## 2012-06-04 LAB — COMPREHENSIVE METABOLIC PANEL
ALT: <5 U/L (ref 0–53)
Alkaline Phosphatase: 86 U/L (ref 39–117)
BUN: 28 mg/dL — ABNORMAL HIGH (ref 6–23)
Chloride: 106 mEq/L (ref 96–112)
GFR calc Af Amer: 82 mL/min — ABNORMAL LOW (ref >90)
Glucose, Bld: 124 mg/dL — ABNORMAL HIGH (ref 70–99)
Potassium: 4 mEq/L (ref 3.5–5.1)
Sodium: 141 mEq/L (ref 135–145)
Total Bilirubin: 0.2 mg/dL — ABNORMAL LOW (ref 0.3–1.2)

## 2012-06-04 MED ORDER — POTASSIUM CHLORIDE CRYS ER 20 MEQ PO TBCR
20.0000 meq | EXTENDED_RELEASE_TABLET | Freq: Every day | ORAL | Status: DC
  Administered 2012-06-04 – 2012-06-07 (×4): 20 meq via ORAL
  Filled 2012-06-04 (×4): qty 1

## 2012-06-04 MED ORDER — SODIUM CHLORIDE 0.9 % IJ SOLN: 3.0000 mL | INTRAMUSCULAR | Status: DC | PRN

## 2012-06-04 MED ORDER — AMLODIPINE BESYLATE 5 MG PO TABS
5.0000 mg | ORAL_TABLET | Freq: Every day | ORAL | Status: DC
  Administered 2012-06-04 – 2012-06-07 (×4): 5 mg via ORAL
  Filled 2012-06-04 (×4): qty 1

## 2012-06-04 MED ORDER — ACETAMINOPHEN 650 MG RE SUPP: 650.0000 mg | Freq: Four times a day (QID) | RECTAL | Status: DC | PRN

## 2012-06-04 MED ORDER — SODIUM CHLORIDE 0.9 % IV SOLN: 250.0000 mL | INTRAVENOUS | Status: DC | PRN

## 2012-06-04 MED ORDER — SODIUM CHLORIDE 0.9 % IJ SOLN
3.0000 mL | Freq: Two times a day (BID) | INTRAMUSCULAR | Status: DC
  Administered 2012-06-04 – 2012-06-06 (×4): 3 mL via INTRAVENOUS

## 2012-06-04 MED ORDER — LORAZEPAM 2 MG/ML IJ SOLN: 2.0000 mg | Freq: Once | INTRAMUSCULAR | Status: DC

## 2012-06-04 MED ORDER — SERTRALINE HCL 100 MG PO TABS
100.0000 mg | ORAL_TABLET | Freq: Every day | ORAL | Status: DC
  Administered 2012-06-04 – 2012-06-07 (×4): 100 mg via ORAL
  Filled 2012-06-04 (×4): qty 1

## 2012-06-04 MED ORDER — ENTACAPONE 200 MG PO TABS
200.0000 mg | ORAL_TABLET | Freq: Three times a day (TID) | ORAL | Status: DC
  Administered 2012-06-04 – 2012-06-07 (×9): 200 mg via ORAL
  Filled 2012-06-04 (×11): qty 1

## 2012-06-04 MED ORDER — LAMOTRIGINE 150 MG PO TABS
150.0000 mg | ORAL_TABLET | Freq: Two times a day (BID) | ORAL | Status: DC
  Administered 2012-06-04 – 2012-06-07 (×6): 150 mg via ORAL
  Filled 2012-06-04 (×7): qty 1

## 2012-06-04 MED ORDER — LORAZEPAM 0.5 MG PO TABS
0.5000 mg | ORAL_TABLET | Freq: Every day | ORAL | Status: DC
  Administered 2012-06-04 – 2012-06-06 (×3): 0.5 mg via ORAL
  Filled 2012-06-04 (×3): qty 1

## 2012-06-04 MED ORDER — ENOXAPARIN SODIUM 40 MG/0.4ML ~~LOC~~ SOLN
40.0000 mg | SUBCUTANEOUS | Status: DC
  Administered 2012-06-04 – 2012-06-06 (×3): 40 mg via SUBCUTANEOUS
  Filled 2012-06-04 (×4): qty 0.4

## 2012-06-04 MED ORDER — LITHIUM CARBONATE ER 300 MG PO TBCR
300.0000 mg | EXTENDED_RELEASE_TABLET | Freq: Two times a day (BID) | ORAL | Status: DC
  Administered 2012-06-04 – 2012-06-07 (×6): 300 mg via ORAL
  Filled 2012-06-04 (×7): qty 1

## 2012-06-04 MED ORDER — ACETAMINOPHEN 325 MG PO TABS: 650.0000 mg | ORAL_TABLET | Freq: Four times a day (QID) | ORAL | Status: DC | PRN

## 2012-06-04 MED ORDER — RAMIPRIL 5 MG PO CAPS
5.0000 mg | ORAL_CAPSULE | Freq: Two times a day (BID) | ORAL | Status: DC
  Administered 2012-06-04 – 2012-06-07 (×6): 5 mg via ORAL
  Filled 2012-06-04 (×7): qty 1

## 2012-06-04 MED ORDER — INSULIN ASPART 100 UNIT/ML ~~LOC~~ SOLN: 0.0000 [IU] | SUBCUTANEOUS | Status: DC

## 2012-06-04 MED ORDER — ASPIRIN EC 81 MG PO TBEC
81.0000 mg | DELAYED_RELEASE_TABLET | Freq: Every day | ORAL | Status: DC
  Administered 2012-06-05 – 2012-06-07 (×3): 81 mg via ORAL
  Filled 2012-06-04 (×3): qty 1

## 2012-06-04 MED ORDER — HYDROCHLOROTHIAZIDE 25 MG PO TABS
25.0000 mg | ORAL_TABLET | Freq: Every day | ORAL | Status: DC
  Administered 2012-06-05 – 2012-06-07 (×3): 25 mg via ORAL
  Filled 2012-06-04 (×3): qty 1

## 2012-06-04 MED ORDER — CARBIDOPA-LEVODOPA 25-250 MG PO TABS
1.0000 | ORAL_TABLET | Freq: Three times a day (TID) | ORAL | Status: DC
  Administered 2012-06-04 – 2012-06-07 (×9): 1 via ORAL
  Filled 2012-06-04 (×11): qty 1

## 2012-06-04 NOTE — H&P (Signed)
Chief Complaint: mental decline HPI: [name] is an 77 y.o. male who is primary neurology patient of Dr. Anne Hahn of [facility]. Patient has a known history of PD, bipolar, anxiety and depression. Patient was hospitalized in 03/2012 for PNA and UTI.  He was discharged on 04/16/2012.  Since he was discharged he has not fully returned to his mental baseline and function level continued to decline. CT of head on 05/17/12 showed ventriculomegaly and possibly NPH.  Patient underwent a high volume LP on 05/31/12 however did not improve after LP.  Spine fluid analysis was unremarkable other than traumatic tap with RBC 2600, normal glucose, protein 77 and 3 WBC. Patient was seen in out patient office with Dr. Anne Hahn 06/03/12 where his mental and functional decline continued to be noted.  Patient was admitted to [facility] hospital for further evaluation and possible need for placement.   Past Medical History  Diagnosis Date  . Hearing aid worn   . Hypertension   . Parkinson disease   . Pneumonia   . Diabetes mellitus   . Diabetes mellitus type II   . Neuromuscular disorder     parkinsons  . Arthritis   . Bipolar disorder   . Anxiety   . Depression     Past Surgical History  Procedure Date  . Total hip arthroplasty     Family History  Problem Relation Age of Onset  . Depression Father     Social History:  reports that he has never smoked. He has never used smokeless tobacco. He reports that he drinks alcohol. He reports that he does not use illicit drugs.  Allergies:  Allergies  Allergen Reactions  . Cholestatin     Medications Prior to Admission  Medication Sig Dispense Refill  . amLODipine (NORVASC) 5 MG tablet Take 5 mg by mouth daily.        Marland Kitchen aspirin 81 MG tablet Take 81 mg by mouth daily.        . carbidopa-levodopa (SINEMET) 25-250 MG per tablet Take 1 tablet by mouth 3 (three) times daily.        . clotrimazole-betamethasone (LOTRISONE) cream Apply 1 application topically 2 (two)  times daily.       . entacapone (COMTAN) 200 MG tablet Take 200 mg by mouth 3 (three) times daily.       . hydrochlorothiazide (HYDRODIURIL) 25 MG tablet Take 25 mg by mouth daily.        Marland Kitchen lamoTRIgine (LAMICTAL) 150 MG tablet Take 150 mg by mouth 2 (two) times daily.      Marland Kitchen lithium carbonate 300 MG capsule TAKE ONE CAPSULE BY MOUTH TWICE DAILY WITH MEAL.  60 capsule  0  . LORazepam (ATIVAN) 0.5 MG tablet Take 1 tablet (0.5 mg total) by mouth daily at 12 noon.  30 tablet  1  . metFORMIN (GLUCOPHAGE) 500 MG tablet Take 500 mg by mouth 2 (two) times daily with a meal.      . Multiple Vitamins-Minerals (MULTIVITAMINS THER. W/MINERALS) TABS Take 1 tablet by mouth daily.        Marland Kitchen omeprazole (PRILOSEC) 20 MG capsule Take 20 mg by mouth daily.       . potassium chloride SA (K-DUR,KLOR-CON) 20 MEQ tablet Take 20 mEq by mouth daily.      . ramipril (ALTACE) 5 MG tablet Take 5 mg by mouth 2 (two) times daily.        . Red Yeast Rice Extract (RED YEAST RICE PO) Take 2  tablets by mouth at bedtime.        . sertraline (ZOLOFT) 100 MG tablet TAKE ONE TABLET BY MOUTH ONE TIME DAILY  30 tablet  1  . temazepam (RESTORIL) 30 MG capsule Take 1 capsule (30 mg total) by mouth at bedtime as needed for sleep.  30 capsule  1  . lamoTRIgine (LAMICTAL) 150 MG tablet TAKE ONE TABLET BY MOUTH TWICE DAILY  60 tablet  1  . temazepam (RESTORIL) 30 MG capsule Take 1 capsule (30 mg total) by mouth at bedtime as needed for sleep.  30 capsule  1    Scheduled medication    . amLODipine  5 mg Oral Daily  . aspirin EC  81 mg Oral Daily  . carbidopa-levodopa  1 tablet Oral TID  . enoxaparin  40 mg Subcutaneous Q24H  . entacapone  200 mg Oral TID  . hydrochlorothiazide  25 mg Oral Daily  . insulin aspart  0-9 Units Subcutaneous Q4H  . lamoTRIgine  150 mg Oral BID  . lithium carbonate  300 mg Oral Q12H  . LORazepam  2 mg Intravenous Once  . LORazepam  0.5 mg Oral QHS  . potassium chloride  20 mEq Oral Daily  . ramipril  5  mg Oral BID  . sertraline  100 mg Oral Daily  . sodium chloride  3 mL Intravenous Q12H   ROS: History obtained from HHN  General ROS: negative for - chills, fatigue, fever, night sweats, weight gain or weight loss Psychological ROS: negative for - behavioral disorder, hallucinations, memory difficulties, mood swings or suicidal ideation Ophthalmic ROS: negative for - blurry vision, double vision, eye pain or loss of vision ENT ROS: negative for - epistaxis, nasal discharge, oral lesions, sore throat, tinnitus or vertigo Allergy and Immunology ROS: negative for - hives or itchy/watery eyes Hematological and Lymphatic ROS: negative for - bleeding problems, bruising or swollen lymph nodes Endocrine ROS: negative for - galactorrhea, hair pattern changes, polydipsia/polyuria or temperature intolerance Respiratory ROS: negative for - cough, hemoptysis, shortness of breath or wheezing Cardiovascular ROS: negative for - chest pain, dyspnea on exertion, edema or irregular heartbeat Gastrointestinal ROS: negative for - abdominal pain, diarrhea, hematemesis, nausea/vomiting or stool incontinence Genito-Urinary ROS: negative for - dysuria, hematuria, incontinence or urinary frequency/urgency Musculoskeletal ROS: negative for - joint swelling or muscular weakness Neurological ROS: Confusion, gait disturbance, memory loss Dermatological ROS: negative for rash and skin lesion changes  Physical Examination: There were no vitals taken for this visit.  HEENT-  Normocephalic, no lesions, without obvious abnormality.  Normal external eye and conjunctiva.  Normal TM's bilaterally.  Normal auditory canals and external ears. Normal external nose, mucus membranes and septum.  Normal pharynx. Neck supple with no masses, nodes, nodules or enlargement. Cardiovascular- S1, S2 normal Lungs- chest clear, no wheezing, rales, normal symmetric air entry, Heart exam - S1, S2 normal, no murmur, no gallop, rate  regular Abdomen- soft, non-tender; bowel sounds normal; no masses,  no organomegaly Extremities- no joint deformities, effusion, or inflammation  Neurologic Examination: Mental Status: Alert, oriented to hospital and year.  Could not tell me month, city, state, president, or floor. His mentation was slow when asked to do physical tasks.  Speech fluent without evidence of aphasia. Able to follow simple commands without difficulty. Named 4 animals in 1 minute.  Able to follow through with 2/3 step command.  Able to name objects and tell me the purpose of object.  Cranial Nerves: II-Visual fields grossly intact. III/IV/VI-Extraocular movements  intact.  Pupils reactive bilaterally. V/VII-Smile symmetric VIII-grossly intact IX/X-normal gag XI-bilateral shoulder shrug XII-midline tongue extension Motor: 5/5 bilaterally with normal tone and bulk Sensory: Pinprick and light touch intact throughout, bilaterally Deep Tendon Reflexes: 1+ and symmetric throughout Plantars downgoing right and up going left Cerebellar: Normal finger-to-nose smooth, normal heel-to-shin test showed no dysmetria.   Gait: shuffled steps and tendency to fall backwards.     Results for orders placed during the hospital encounter of 06/04/12 (from the past 48 hour(s))  AMMONIA     Status: Normal   Collection Time   06/04/12 11:35 AM      Component Value Range Comment   Ammonia 22  11 - 60 umol/L   GLUCOSE, CAPILLARY     Status: Abnormal   Collection Time   06/04/12 11:38 AM      Component Value Range Comment   Glucose-Capillary 115 (*) 70 - 99 mg/dL   LITHIUM LEVEL     Status: Normal   Collection Time   06/04/12 11:39 AM      Component Value Range Comment   Lithium Lvl 1.07  0.80 - 1.40 mEq/L   CBC     Status: Normal   Collection Time   06/04/12 11:41 AM      Component Value Range Comment   WBC 9.7  4.0 - 10.5 K/uL    RBC 4.34  4.22 - 5.81 MIL/uL    Hemoglobin 13.3  13.0 - 17.0 g/dL    HCT 84.1  32.4 - 40.1 %     MCV 94.0  78.0 - 100.0 fL    MCH 30.6  26.0 - 34.0 pg    MCHC 32.6  30.0 - 36.0 g/dL    RDW 02.7  25.3 - 66.4 %    Platelets 259  150 - 400 K/uL   COMPREHENSIVE METABOLIC PANEL     Status: Abnormal   Collection Time   06/04/12 11:41 AM      Component Value Range Comment   Sodium 141  135 - 145 mEq/L    Potassium 4.0  3.5 - 5.1 mEq/L    Chloride 106  96 - 112 mEq/L    CO2 27  19 - 32 mEq/L    Glucose, Bld 124 (*) 70 - 99 mg/dL    BUN 28 (*) 6 - 23 mg/dL    Creatinine, Ser 4.03  0.50 - 1.35 mg/dL    Calcium 9.4  8.4 - 47.4 mg/dL    Total Protein 5.5 (*) 6.0 - 8.3 g/dL    Albumin 3.5  3.5 - 5.2 g/dL    AST 10  0 - 37 U/L    ALT <5  0 - 53 U/L REPEATED TO VERIFY   Alkaline Phosphatase 86  39 - 117 U/L    Total Bilirubin 0.2 (*) 0.3 - 1.2 mg/dL    GFR calc non Af Amer 71 (*) >90 mL/min    GFR calc Af Amer 82 (*) >90 mL/min   MRSA PCR SCREENING     Status: Normal   Collection Time   06/04/12 11:55 AM      Component Value Range Comment   MRSA by PCR NEGATIVE  NEGATIVE     Mr Brain Wo Contrast  06/04/2012  *RADIOLOGY REPORT*  Clinical Data: Slurred speech.  Altered mental status.  MRI HEAD WITHOUT CONTRAST  Technique:  Multiplanar, multiecho pulse sequences of the brain and surrounding structures were obtained according to standard protocol without intravenous contrast.  Comparison: 05/24/2012  CT.  No comparison MR.  Findings: Motion degraded exam.  No acute infarct (artifact left parietal lobe series 4 image 25).  No intracranial hemorrhage.  Global atrophy.  Ventricular prominence may be related to atrophy although difficult to completely exclude a mild component of hydrocephalus.  Mega cisterna magna.  Mild small vessel disease type changes.  No intracranial mass lesion detected on this unenhanced exam.  Major intracranial vascular structures are patent. Prominent ectasia of the right internal carotid artery supraclinoid segment with superior displacement of the ectatic right anterior  cerebral artery causing compression of the undersurface of the ventricle and the right optic nerve with mild deformity of the optic chiasm.  Paranasal sinus mild mucosal thickening with polypoid appearance inferior right maxillary sinus.  Bilateral mastoid air cell opacification without mass seen in the region of the posterior- superior nasopharynx causing eustachian tube dysfunction.  IMPRESSION: Motion degraded exam.  No acute infarct.  No intracranial hemorrhage.  Global atrophy.  Ventricular prominence may be related to atrophy although difficult to completely exclude a mild component of hydrocephalus.  Mega cisterna magna.  Mild small vessel disease type changes.  Prominent ectasia of the right internal carotid artery supraclinoid segment with superior displacement of the ectatic right anterior cerebral artery causing compression of the undersurface of the ventricle and the right optic nerve with mild deformity of the optic chiasm.  Paranasal sinus mild mucosal thickening with polypoid appearance inferior right maxillary sinus.  Bilateral mastoid air cell opacification.  Original Report Authenticated By: Fuller Canada, M.D.    Assessment/Plan  77 YO male with PD, confusion and recent mental decline since last hospitalization of 03/2012.  MRI shows no acute stroke and global atrophy.  CT of head on 05/17/12 showed ventriculomegaly and possibly NPH.  Patient underwent a high volume LP on 05/31/12 however did not improve after LP. Etiology of mental decline is unclear at this time and may be a progression of his parkinson's disease.   Plan:  1) PD  -Continue home dose of sinemet.  PT/OT  2) Confusion and mental status decline.   -EEG  -B12, Folate, RPR  3) PT and OT consults  3) May need long term care placement.  Patients Ex-wife is power of attorney.  Social work has been contacted and discussion will be held with power of attorney.      Felicie Morn 06/04/2012, 1:59 PM

## 2012-06-05 ENCOUNTER — Encounter (HOSPITAL_COMMUNITY): Payer: Self-pay | Admitting: Internal Medicine

## 2012-06-05 ENCOUNTER — Other Ambulatory Visit: Payer: Self-pay

## 2012-06-05 DIAGNOSIS — F0390 Unspecified dementia without behavioral disturbance: Secondary | ICD-10-CM

## 2012-06-05 DIAGNOSIS — G20 Parkinson's disease: Secondary | ICD-10-CM

## 2012-06-05 LAB — URINALYSIS, MICROSCOPIC ONLY
Glucose, UA: NEGATIVE mg/dL
Ketones, ur: NEGATIVE mg/dL
Nitrite: POSITIVE — AB
Protein, ur: NEGATIVE mg/dL
Urobilinogen, UA: 0.2 mg/dL (ref 0.0–1.0)

## 2012-06-05 LAB — RPR: RPR Ser Ql: NONREACTIVE

## 2012-06-05 LAB — GLUCOSE, CAPILLARY: Glucose-Capillary: 104 mg/dL — ABNORMAL HIGH (ref 70–99)

## 2012-06-05 MED ORDER — INSULIN ASPART 100 UNIT/ML ~~LOC~~ SOLN: 0.0000 [IU] | Freq: Three times a day (TID) | SUBCUTANEOUS | Status: DC

## 2012-06-05 MED ORDER — DEXTROSE 5 % IV SOLN
1.0000 g | INTRAVENOUS | Status: DC
  Administered 2012-06-05 – 2012-06-06 (×2): 1 g via INTRAVENOUS
  Filled 2012-06-05 (×3): qty 10

## 2012-06-05 NOTE — Progress Notes (Signed)
[facility] Progress Note   Resident Note, please see attending addendum for additional details.  Subjective:    Chief Complaint: mental decline  Currently, the patient has no acute complaints, except that he feels bad that he is not doing well on the exams performed by physicians. Indicates he is completely dependent of ADLs/IADLs as an outpatient. Denies issues with urinary/ fecal incontinence. States he uses rolling walker without frequent falls. Admits to increased confusion since hospital discharge. Denies current cough, chest pain, shortness of breath, nausea, vomiting, difficulty breathing, dysuria, hematuria, diarrhea, constipation.  Interval Events: EEG unremarkable except indication of mild encephalopathy.   Objective:    Vital Signs:   Temp:  [97.6 F (36.4 C)-98.2 F (36.8 C)] 98.2 F (36.8 C) (06/19 0600) Pulse Rate:  [47-53] 50  (06/19 0600) Resp:  [17-18] 18  (06/19 0600) BP: (89-136)/(52-79) 136/79 mmHg (06/19 0600) SpO2:  [96 %-100 %] 97 % (06/19 0600) Weight:  [138 lb 11.2 oz (62.914 kg)-139 lb 5.3 oz (63.2 kg)] 138 lb 11.2 oz (62.914 kg) (06/18 1420)    24-hour weight change: Weight change:   Intake/Output:   Intake/Output Summary (Last 24 hours) at 06/05/12 0803 Last data filed at 06/05/12 0500  Gross per 24 hour  Intake    360 ml  Output      0 ml  Net    360 ml      Physical Exam: General: Vital signs reviewed and noted. Well-developed, thin-appearing in no acute distress; Anxious, but appropriate and cooperative throughout examination.  Lungs:  Normal respiratory effort. Clear to auscultation BL without crackles or wheezes.  Heart: RRR. S1 and S2 normal without gallop, murmur, or rubs.  Abdomen:  BS normoactive. Soft, Nondistended, non-tender.  No masses or organomegaly.  Extremities: No pretibial edema.    Neurologic Exam: Mental Status: Alert, oriented to person, hospital, month.  Unable to specify season. His mentation was slow  when asked to do physical tasks.  Cranial Nerves: II-Visual fields grossly intact. III/IV/VI-Extraocular movements intact.  Pupils reactive bilaterally. V/VII-Smile symmetric VIII-grossly intact IX/X-normal gag XI-bilateral shoulder shrug XII-midline tongue extension Motor: 5/5 bilaterally with normal tone and bulk Sensory: Light touch intact throughout, bilaterally Cerebellar: Normal finger-to-nose smooth although require redirection to perform tasks, normal heel-to-shin test showed no dysmetria.      Labs:  Basic Metabolic Panel:  Lab 06/04/12 1610  NA 141  K 4.0  CL 106  CO2 27  GLUCOSE 124*  BUN 28*  CREATININE 1.01  CALCIUM 9.4  MG --  PHOS --    Liver Function Tests:  Lab 06/04/12 1141  AST 10  ALT <5  ALKPHOS 86  BILITOT 0.2*  PROT 5.5*  ALBUMIN 3.5    Lab 06/04/12 1135  AMMONIA 22   CBC:  Lab 06/04/12 1141  WBC 9.7  NEUTROABS --  HGB 13.3  HCT 40.8  MCV 94.0  PLT 259   CBG:  Lab 06/05/12 0344 06/04/12 2355 06/04/12 2019 06/04/12 1637 06/04/12 1138  GLUCAP 104* 99 113* 125* 115*    Microbiology: Results for orders placed during the hospital encounter of 06/04/12  MRSA PCR SCREENING     Status: Normal   Collection Time   06/04/12 11:55 AM      Component Value Range Status Comment   MRSA by PCR NEGATIVE  NEGATIVE Final      Imaging:  Dg Chest 2 View (06/04/2012) - Cardiomegaly.  No acute findings.  Original Report Authenticated By: Cyndie Chime, M.D.  Mr Brain Wo Contrast (06/04/2012) - Motion degraded exam.  No acute infarct.  No intracranial hemorrhage.  Global atrophy.  Ventricular prominence may be related to atrophy although difficult to completely exclude a mild component of hydrocephalus.  Mega cisterna magna.  Mild small vessel disease type changes.  Prominent ectasia of the right internal carotid artery supraclinoid segment with superior displacement of the ectatic right anterior cerebral artery causing compression of the  undersurface of the ventricle and the right optic nerve with mild deformity of the optic chiasm.  Paranasal sinus mild mucosal thickening with polypoid appearance inferior right maxillary sinus.  Bilateral mastoid air cell opacification.  Original Report Authenticated By: Fuller Canada, M.D.   EEG (06/05/2012) - This routine EEG done with the patient awake and drowsy is abnormal. Background activities in the theta range suggest a mild encephalopathy of nonspecific etiology. No interictal epileptiform discharges or electrographic seizures were seen.    Medications:    Infusions:    Scheduled Medications:    . amLODipine  5 mg Oral Daily  . aspirin EC  81 mg Oral Daily  . carbidopa-levodopa  1 tablet Oral TID  . enoxaparin  40 mg Subcutaneous Q24H  . entacapone  200 mg Oral TID  . hydrochlorothiazide  25 mg Oral Daily  . insulin aspart  0-9 Units Subcutaneous Q4H  . lamoTRIgine  150 mg Oral BID  . lithium carbonate  300 mg Oral Q12H  . LORazepam  2 mg Intravenous Once  . LORazepam  0.5 mg Oral QHS  . potassium chloride  20 mEq Oral Daily  . ramipril  5 mg Oral BID  . sertraline  100 mg Oral Daily  . sodium chloride  3 mL Intravenous Q12H    PRN Medications: sodium chloride, acetaminophen, acetaminophen, sodium chloride   Assessment/ Plan:    Assessment: 77 YO male with PD, confusion and recent mental decline since last hospitalization of 03/2012.  MRI shows no acute stroke and global atrophy.  CT of head on 05/17/12 showed ventriculomegaly and possibly NPH.  Patient underwent a high volume LP on 05/31/12 however did not improve after LP. Spine fluid analysis was unremarkable other than traumatic tap with RBC 2600, normal glucose, protein 77 and 3 WBC. Etiology of mental decline is unclear at this time and may be a progression of his parkinson's disease.    Plan: 1) Parkinson Disease - patient has significant baseline deficits requiring help with all ADL/ IADLs with caregivers.  At this point, the patient lives alone with home health services. Ex-wife is his power of attorney. - Continue home dose of sinemet.   - PT/OT - Will discuss with ex-wife reqarding placement versus home health needs.  - Social work consult placed.  2) Confusion and mental status decline - unclear etiology, likely residual deficit following recent acute illness requiring hospitalization compounding upon his baseline PD with significant deficits. Notably, EEG unremarkable for evidence of seizure activity. B12, folate, ammonia, TSH levels within normal limits. RPR negative. - Will check UA with microscopy given recent UTI (UCx from 04/18/2012 showing 60,000 colonies of multiple bacterial morphotypes). Notably, there was no improvement of mental status after hgih volume LP performed on 06/14 as an outpatient by primary neurologist Dr. Anne Hahn.  - May need adjustment of diabetic regimen as an outpatient given that A1c is 4.9.  3) DMII - A1c 4.8 - only on metformin at home.  - Continue SSI at present. - May need outpatient adjustment of regimen, may not  need as tight blood sugar control given risk of hypoglycemia can be more detrimental to this patient than mild hyperglycemia.  4) Bipolar disorder - continue home medications.  5) Depression - continue home setraline.  6) Dispo - will likely need long term care placement. Will need to discuss with his ex-wife who is POA. As well, SW consult has been placed.    Patient history and plan of care reviewed with attending, Dr. Thana Farr.   Signed: Johnette Abraham, Erline Levine, Internal Medicine Resident Pager: [phone] (7AM-5PM) 06/05/2012, 8:19 AM    Case discussed with social work.  Placement is being addressed.  Cognitive issues apparent on examination.  Urinalysis performed and does suggest a recurrent UTI.  WBC count normal.  Patient afebrile but continued illness may be affecting mental status.   Patient on Rocephin, Vancomycin and  Levaquin on last visit on April.  Will await sensitivities from recent urine sample.    Patient seen and examined. I agree with the above.  Thana Farr, MD [facility] [phone]  06/05/2012  2:24 PM

## 2012-06-05 NOTE — Procedures (Signed)
EEG NUMBER:  [id number]  This routine EEG was requested in a 77 year old man who is admitted due to increasing confusion.  Purpose of this EEG is to look for interictal epileptiform discharges that may suggest a seizure.  His medications include lamotrigine and lorazepam.  EEG was done with the patient awake.  During periods of maximal wakefulness, he had background activities in the high theta range. These were moderately organized.  No clear alpha rhythm was seen.  Photic stimulation did not produce a driving response.  Hyperventilation was not performed.  The patient did not sleep.  CLINICAL INTERPRETATION:  This routine EEG done with the patient awake and drowsy is abnormal.  Background activities in the theta range suggest a mild encephalopathy of nonspecific etiology.  No interictal epileptiform discharges or electrographic seizures were seen.          ______________________________ Denton Meek, MD    [id number] D:  06/05/2012 07:22:58  T:  06/05/2012 07:35:33  Job #:  [id number]

## 2012-06-05 NOTE — Clinical Social Work Placement (Addendum)
    Clinical Social Work Department CLINICAL SOCIAL WORK PLACEMENT NOTE 06/05/2012  Patient:  KESTON, SEEVER  Account Number:  [id number] Admit date:  06/04/2012  Clinical Social Worker:  Peggyann Shoals  Date/time:  06/05/2012 11:13 AM  Clinical Social Work is seeking post-discharge placement for this patient at the following level of care:   SKILLED NURSING   (*CSW will update this form in Epic as items are completed)   06/05/2012  Patient/family provided with [facility] Department of Clinical Social Work's list of facilities offering this level of care within the geographic area requested by the patient (or if unable, by the patient's family).  06/05/2012  Patient/family informed of their freedom to choose among providers that offer the needed level of care, that participate in Medicare, Medicaid or managed care program needed by the patient, have an available bed and are willing to accept the patient.  06/05/2012  Patient/family informed of [facility]' ownership interest in [facility], as well as of the fact that they are under no obligation to receive care at this facility.  PASARR submitted to EDS on 2010 PASARR number received from EDS on 2010  FL2 transmitted to all facilities in geographic area requested by pt/family on  06/05/2012 FL2 transmitted to all facilities within larger geographic area on   Patient informed that his/her managed care company has contracts with or will negotiate with  certain facilities, including the following:     Patient/family informed of bed offers received:  06/06/2012 Patient [facility] bed at [facility] ([facility] Physician recommends and patient chooses bed at  [facility] ([facility]  Patient to be transferred to [facility] on 06/07/12  ([facility]) Patient to be transferred to facility by [facility]. ([facility])  The following physician request were entered in Epic:   Additional Comments:

## 2012-06-05 NOTE — Progress Notes (Signed)
Patient [name], 77 year old white male, feels comfortable as he adjusts to his loss of independent living.  Patient expressed appreciation for Chaplain's provision of pastoral presence, prayer, and conversation.  I will follow-up as needed.

## 2012-06-05 NOTE — Clinical Social Work Psychosocial (Signed)
     Clinical Social Work Department BRIEF PSYCHOSOCIAL ASSESSMENT 06/05/2012  Patient:  [name], [name]     Account Number:  [id number]     Admit date:  06/04/2012  Clinical Social Worker:  Peggyann Shoals  Date/Time:  06/05/2012 11:02 AM  Referred by:  Physician  Date Referred:  06/05/2012 Referred for  SNF Placement   Other Referral:   Interview type:  Family Other interview type:    PSYCHOSOCIAL DATA Living Status:  ALONE Admitted from facility:   Level of care:   Primary support name:  Montravious Weigelt Primary support relationship to patient:  FAMILY Degree of support available:   Pt's ex-wife as well as POA. Supportive. [phone].    CURRENT CONCERNS Current Concerns  Post-Acute Placement   Other Concerns:    SOCIAL WORK ASSESSMENT / PLAN CSW met with pt and ex-wife to address consult. PT is recommending SNF vs HHPT as pt has 24 hour care. Pt's POA shared that he lives alone and has the care of a private duty agency to provide 24 hour care as well as [facility] for home health services. CSW explained the process of SNF placement. CSW and RNCM explained to pt and POA the concern for a 3 night qualifying stay in order for Medicare to pay for SNF placement. CSW shared that pt could be placed however pt would have considered a private pay. CSW explored the option of applying for Medicaid as it is an option for long term care coverage, which POA has declined.    CSW will initiate SNF search and follow up with bed offers. CSW will continue to follow to facilitate discharge plan.   Assessment/plan status:  Psychosocial Support/Ongoing Assessment of Needs Other assessment/ plan:   Other:  CSW will work with [facility] regarding dispo as it has yet to be determined if pt has a 3 night qualifying stay for Medicare to cover SNF.   Information/referral to community resources:   List of SNFs and [facility] DSS.    PATIENTS/FAMILYS RESPONSE TO PLAN OF CARE: Pt is alert,  but is questionable how oriented pt is. Pt's POA is very involved in pt's care. Pt's POA is concerned whether pt will have a 2 night qualifying stay in order to have Medicare cover SNF.

## 2012-06-05 NOTE — Evaluation (Signed)
Occupational Therapy Evaluation Patient Details Name: [name] MRN: [id number] DOB: 08-17-1935 Today's Date: 06/05/2012 Time: 1478-2956 OT Time Calculation (min): 31 min  OT Assessment / Plan / Recommendation Clinical Impression  Pt admitted with increased confusion and decreased independence with mobility and ADLs.  Had shown an overall decrease in functional independence since April. Now presents with variable need for asisst ance with ADLs.  Can require from total to min faciitation to perform transfers and simulated selfcare tasks.  Feel he will need acute OT to help increase consistency and independence with basic ADLS.  Anticipate he will need SNF for follow-up rehab before discharging back home.      OT Assessment  Patient needs continued OT Services    Follow Up Recommendations  Skilled nursing facility       Equipment Recommendations  Defer to next venue       Frequency  Min 2X/week    Precautions / Restrictions Precautions Precautions: Fall Restrictions Weight Bearing Restrictions: No   Pertinent Vitals/Pain No report of pain    ADL  Eating/Feeding: Simulated;Supervision/safety Where Assessed - Eating/Feeding: Chair Grooming: Performed;Wash/dry hands;Denture care;Moderate assistance Where Assessed - Grooming: Supported standing Upper Body Bathing: Simulated;Set up Where Assessed - Upper Body Bathing: Supported sitting Lower Body Bathing: Simulated;Moderate assistance Where Assessed - Lower Body Bathing: Supported sit to stand Upper Body Dressing: Simulated;Supervision/safety Where Assessed - Upper Body Dressing: Unsupported sitting Lower Body Dressing: Simulated;Moderate assistance Where Assessed - Lower Body Dressing: Sopported sit to stand Toilet Transfer: Performed;Moderate assistance Toilet Transfer Method: Stand pivot Toilet Transfer Equipment: Comfort height toilet;Grab bars Toileting - Clothing Manipulation and Hygiene: Performed;Moderate  assistance Where Assessed - Toileting Clothing Manipulation and Hygiene: Sit to stand from 3-in-1 or toilet Tub/Shower Transfer Method: Not assessed Transfers/Ambulation Related to ADLs: Pt mod facilitation to walk to the bathroom and the sink without UE support during OT session. ADL Comments: Pt needs mod instructional cueing to sequence through brushing his teeth.  He forgot to put toothpaste on his toothbrush when brushing his teeth.  Also forgot to push down his underwear when attempting bowel movement.  Able to perform sit to stand with min facilitation but needs mod asisst to maintain blaance with functional transfers.      OT Diagnosis: Generalized weakness;Acute pain;Cognitive deficits  OT Problem List: Decreased strength;Impaired balance (sitting and/or standing);Decreased knowledge of use of DME or AE;Pain;Decreased cognition;Decreased safety awareness OT Treatment Interventions: Self-care/ADL training;Therapeutic activities;DME and/or AE instruction;Balance training;Patient/family education   OT Goals Acute Rehab OT Goals Time For Goal Achievement: 06/19/12 Potential to Achieve Goals: Good ADL Goals Pt Will Perform Grooming: with supervision;Standing at sink (2 tasks) ADL Goal: Grooming - Progress: Goal set today Pt Will Perform Lower Body Bathing: with supervision;Sit to stand from bed;Sit to stand from chair ADL Goal: Lower Body Bathing - Progress: Goal set today Pt Will Perform Lower Body Dressing: with supervision;Sit to stand from bed;Sit to stand from chair ADL Goal: Lower Body Dressing - Progress: Goal set today Pt Will Transfer to Toilet: with supervision;with DME;3-in-1 ADL Goal: Toilet Transfer - Progress: Goal set today Pt Will Perform Tub/Shower Transfer: with supervision;Stand pivot transfer ADL Goal: Tub/Shower Transfer - Progress: Goal set today  Visit Information  Last OT Received On: 06/05/12 Assistance Needed: +1    Subjective Data  Subjective: "I don't  know why I'm here." Patient Stated Goal: Pt did not state but requested to go back to bed.   Prior Functioning  Home Living Lives With: Alone Available Help  at Discharge:  (24 hour caregivers) Type of Home: House Home Access: Stairs to enter [employer] of Steps: 2 Entrance Stairs-Rails: None Home Layout: One level Bathroom Shower/Tub: Tub/shower unit;Walk-in Stage manager: Standard Home Adaptive Equipment: Shower chair with back;Straight cane;Walker - rolling;Wheelchair - manual;Bedside commode/3-in-1;Hand-held shower hose Prior Function Level of Independence: Needs assistance Needs Assistance: Bathing;Dressing;Feeding;Grooming;Toileting;Meal Prep;Light Housekeeping;Gait;Transfers Bath: Supervision/set-up Dressing: Supervision/set-up Feeding: Supervision/set-up Grooming: Supervision/set-up Toileting: Supervision/set-up Meal Prep: Minimal Light Housekeeping: Minimal Gait Assistance:  (has not ambulated since last admission 04/17/12) Transfer Assistance: min (A) with transfers Able to Take Stairs?: No Driving: No Vocation: Retired Musician: No difficulties Dominant Hand: Right    Cognition  Overall Cognitive Status: Impaired Area of Impairment: Attention;Memory;Safety/judgement;Awareness of deficits Arousal/Alertness: Awake/alert Orientation Level: Disoriented to;Time;Situation Behavior During Session: Anxious Current Attention Level: Sustained Memory Deficits: Decreased awareness of situation and time even though he had been told them earlier during session.   Safety/Judgement: Decreased awareness of need for assistance Safety/Judgement - Other Comments: Pt attempts to stand without therapist in position to keep him safe.      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  Overall AROM shoulder  flexion 0- 130 degrees,  houlder strength 3+/5. RUE Sensation: WFL - Light Touch RUE Coordination: WFL - gross/fine motor Left Upper Extremity Assessment LUE ROM/Strength/Tone: Deficits LUE ROM/Strength/Tone Deficits: Shoulder flexion AROM 0-110 degrees.  Strength 3/5 for shoulder flexion.   LUE Sensation: WFL - Light Touch LUE Coordination: WFL - gross/fine motor Trunk Assessment Trunk Assessment: Kyphotic Trunk Exceptions: Flexed trunk forward and to the right in standing.   Mobility Bed Mobility Bed Mobility: Sit to Supine Sit to Supine: 5: Supervision;HOB flat      Balance Balance Balance Assessed: Yes Static Standing Balance Static Standing - Balance Support: Right upper extremity supported;Left upper extremity supported Static Standing - Level of Assistance: 4: Min assist  End of Session OT - End of Session Activity Tolerance: Patient tolerated treatment well Patient left: in bed;with call bell/phone within reach;with family/visitor present;with bed alarm set   Roger Fasnacht OTR/L 06/05/2012, 1:22 PM Pager number [phone]

## 2012-06-05 NOTE — Evaluation (Addendum)
Physical Therapy Evaluation Patient Details Name: [name] MRN: [id number] DOB: 1935-11-02 Today's Date: 06/05/2012 Time: 4098-1191 PT Time Calculation (min): 25 min  PT Assessment / Plan / Recommendation Clinical Impression  Pt is 77 y/o male admitted for AMS, increase generalized weakness and progressive parkinson disease.  Pt limited due to anxiety and fearful of falling.  Pt will benefit from acute PT services to improve overall mobility, gait training and trasnfer training.    PT Assessment  Patient needs continued PT services    Follow Up Recommendations  Skilled nursing facility (depending on pt progress and POA decision) Per Caregiver POA is considering SNF;  Pt has 24 hour caregivers at home therefore can d/c home with HHPT if caregivers continued.   Barriers to Discharge        lEquipment Recommendations  Defer to next venue    Recommendations for Other Services     Frequency Min 3X/week    Precautions / Restrictions Precautions Precautions: Fall   Pertinent Vitals/Pain No c/o pain      Mobility  Bed Mobility Bed Mobility: Supine to Sit Rolling Right: 4: Min guard;With rail (HOB 30 degrees) Supine to Sit: 4: Min guard;HOB elevated;With rails (HOB 30 degrees) Details for Bed Mobility Assistance: Minguard (A) for safety with cues for proper technique Transfers Transfers: Sit to Stand;Stand to Sit Sit to Stand: 1: +2 Total assist;From bed Sit to Stand: Patient Percentage: 70% Stand to Sit: 1: +2 Total assist;To chair/3-in-1 Stand to Sit: Patient Percentage: 50% Details for Transfer Assistance: (A) to initiate transfer with max cues for hand placement.  Pt very rigid and needs extra time to perform stand > sit.   Ambulation/Gait Ambulation/Gait Assistance: Not tested (comment)    Exercises     PT Diagnosis: Difficulty walking;Abnormality of gait;Generalized weakness  PT Problem List: Decreased strength;Decreased range of motion;Decreased activity  tolerance;Decreased balance;Decreased mobility;Decreased coordination;Decreased knowledge of use of DME;Decreased cognition PT Treatment Interventions: DME instruction;Gait training;Functional mobility training;Therapeutic activities;Therapeutic exercise;Balance training;Patient/family education   PT Goals Acute Rehab PT Goals PT Goal Formulation: With patient Time For Goal Achievement: 06/19/12 Potential to Achieve Goals: Good Pt will go Supine/Side to Sit: with modified independence PT Goal: Supine/Side to Sit - Progress: Goal set today Pt will go Sit to Supine/Side: with modified independence PT Goal: Sit to Supine/Side - Progress: Goal set today Pt will go Sit to Stand: with supervision PT Goal: Sit to Stand - Progress: Goal set today Pt will go Stand to Sit: with supervision PT Goal: Stand to Sit - Progress: Goal set today Pt will Transfer Bed to Chair/Chair to Bed: with min assist PT Transfer Goal: Bed to Chair/Chair to Bed - Progress: Goal set today Pt will Stand: with supervision;3 - 5 min;with bilateral upper extremity support PT Goal: Stand - Progress: Goal set today Pt will Ambulate: 1 - 15 feet;with rolling walker;with mod assist PT Goal: Ambulate - Progress: Goal set today  Visit Information  Last PT Received On: 06/05/12 Assistance Needed: +2    Subjective Data  Subjective: "I'm very fearful of this." (speaking of transfers) Patient Stated Goal: Pt wants to return home.  However per caregiver POA considering SNF.   Prior Functioning  Home Living Lives With: Alone Available Help at Discharge:  (24 hour caregivers) Type of Home: House Home Access: Stairs to enter [employer] of Steps: 2 Entrance Stairs-Rails: None Home Layout: One level Bathroom Shower/Tub: Tub/shower unit;Walk-in Stage manager: Standard Home Adaptive Equipment: Shower chair with back;Straight cane;Walker -  rolling;Wheelchair - manual;Bedside commode/3-in-1;Hand-held shower  hose Prior Function Level of Independence: Needs assistance Needs Assistance: Bathing;Dressing;Feeding;Grooming;Toileting;Meal Prep;Light Housekeeping;Gait;Transfers Bath: Total Dressing: Total Feeding: Total Grooming: Total Toileting: Total Meal Prep: Total Light Housekeeping: Total Gait Assistance:  (has not ambulated since last admission 04/17/12) Transfer Assistance: min (A) with transfers Able to Take Stairs?: No Driving: No Vocation: Retired Comments: Pt completely independent with ADLs and gait without AD prior to last hospital admission on 04/16/12.  Per caregiver pt now total dependent with ADLs, min/mod(A) with transfer, minimal ambulation but needed (A) when ambulated.  Per caregiver pt was ambulating in halls prior to d/c ~1 month ago with steady decline in all mobility. Communication Communication: No difficulties Dominant Hand: Right    Cognition  Overall Cognitive Status: Impaired Area of Impairment: Attention;Awareness of deficits;Safety/judgement Arousal/Alertness: Awake/alert Orientation Level: Disoriented to;Time;Situation (needed extra time to state place) Behavior During Session: Anxious Current Attention Level: Sustained Attention - Other Comments: Pt slightly agitated with cognitive questions and anxious. Safety/Judgement: Decreased safety judgement for tasks assessed    Extremity/Trunk Assessment Right Lower Extremity Assessment RLE ROM/Strength/Tone: Deficits RLE ROM/Strength/Tone Deficits: Noticeable rigidity throughout ROM; At least 3+/5 gross with functional mobility Left Lower Extremity Assessment LLE ROM/Strength/Tone: Deficits LLE ROM/Strength/Tone Deficits: Noticeable rigidity throughout ROM; At least 3+/5 gross with functional mobility   Balance Static Standing Balance Static Standing - Balance Support: Bilateral upper extremity supported Static Standing - Level of Assistance: 2: Max assist;4: Min assist Static Standing - Comment/# of Minutes:  Initial max (A) to maintain upright posture due to severe posterior lean.  However min (A) when pt holding onto back of chair but continues to lean posterior.  Dynamic Standing Balance Dynamic Standing - Balance Support: Bilateral upper extremity supported Dynamic Standing - Level of Assistance: 4: Min assist Dynamic Standing - Balance Activities: Lateral lean/weight shifting;Other (comment) (Marching) Dynamic Standing - Comments: Severe posterior lean and using upperbody on back of chair and LE against recliner to maintain balance.  End of Session PT - End of Session Equipment Utilized During Treatment: Gait belt Activity Tolerance: Patient tolerated treatment well Patient left: in chair;with call bell/phone within reach (caregiver present) Nurse Communication: Mobility status   Beacher Every 06/05/2012, 9:34 AM Jake Shark, PT DPT [phone]

## 2012-06-05 NOTE — Care Management Note (Signed)
    Page 1 of 1   06/05/2012     10:51:50 AM   CARE MANAGEMENT NOTE 06/05/2012  Patient:  [name], [name]   Account Number:  [id number]  Date Initiated:  06/05/2012  Documentation initiated by:  Onnie Boer  Subjective/Objective Assessment:   PT WAS ADMITTED WITH AMS AND DECLINE IN HEALTH SINCE LAST ADMISSION     Action/Plan:   PROGRESSION OF CARE AND DISCHARGE PLANNING   Anticipated DC Date:  06/06/2012   Anticipated DC Plan:  HOME W HOME HEALTH SERVICES  In-house referral  Clinical Social Worker      DC Planning Services  CM consult      Choice offered to / List presented to:             Status of service:  In process, will continue to follow Medicare Important Message given?   (If response is "NO", the following Medicare IM given date fields will be blank) Date Medicare IM given:   Date Additional Medicare IM given:    Discharge Disposition:    Per UR Regulation:  Reviewed for med. necessity/level of care/duration of stay  If discussed at Long Length of Stay Meetings, dates discussed:    Comments:  06/05/12 Onnie Boer, RN, BSN 1048 PT WAS ADMITTED FROM HOME WITH 24 HR CAREGIVERS.  PTA PT WAS AT HOME WITH AMS AND DECLINE IN OVERALL HEALTH ACCORDING TO HIS EX WIFE.  FAMILY IS INTERESTED IN SNF. CSW AND I HAVE SPOKEN WITH THE PT AND FAMILY IN REGUARDS TO THE REQUIREMENTS OF MEDICARE PAYING FOR SNF VS PRIVATE PAY IN HIS SITUATION.  I HAVE SENT HIS REVIEW TO EHR AND ARE AWAITING DETERMINATION.  WILL F/U WITH EX WIFE AS SHE IS HIS HCPOA AND DOSENT WANT HIM TO KNOW ABOUT THE FINANCIAL COMPONENTS OF PRIVATE PAY PLACEMENT.

## 2012-06-06 DIAGNOSIS — G20 Parkinson's disease: Secondary | ICD-10-CM

## 2012-06-06 DIAGNOSIS — E782 Mixed hyperlipidemia: Secondary | ICD-10-CM

## 2012-06-06 DIAGNOSIS — F0390 Unspecified dementia without behavioral disturbance: Secondary | ICD-10-CM

## 2012-06-06 LAB — GLUCOSE, CAPILLARY
Glucose-Capillary: 95 mg/dL (ref 70–99)
Glucose-Capillary: 101 mg/dL — ABNORMAL HIGH (ref 70–99)

## 2012-06-06 NOTE — Progress Notes (Signed)
[facility] Progress Note   Resident Note, please see attending addendum for additional details.  Subjective:    Chief Complaint: mental decline  Currently, the patient has no acute complaints. He was able to ambulate better with PT today. No fevers, chills, nausea, vomiting, abdominal pain, shortness of breath, chest pain, lightheadedness, dizziness.    Interval Events: - UA indicates recurrent UTI, pending urine culture and sensitivities. - Process started for SNF placement.   Objective:    Vital Signs:   Temp:  [97.8 F (36.6 C)-98.8 F (37.1 C)] 97.8 F (36.6 C) (06/20 0600) Pulse Rate:  [52-59] 52  (06/20 0600) Resp:  [18-20] 20  (06/20 0600) BP: (116-130)/(61-80) 125/80 mmHg (06/20 0600) SpO2:  [94 %-99 %] 96 % (06/20 0600)    24-hour weight change: Weight change:   Intake/Output:   Intake/Output Summary (Last 24 hours) at 06/06/12 0828 Last data filed at 06/06/12 1478  Gross per 24 hour  Intake    360 ml  Output   1500 ml  Net  -1140 ml      Physical Exam:  General Exam:   General: Vital signs reviewed and noted. Well-developed, thin-appearing in no acute distress; Anxious, but appropriate and cooperative throughout examination.  Lungs:  Normal respiratory effort. Clear to auscultation BL without crackles or wheezes.  Heart: RRR. S1 and S2 normal without gallop, murmur, or rubs.  Abdomen:  BS normoactive. Soft, Nondistended, non-tender.  No masses or organomegaly.  Extremities: No pretibial edema.     Neurologic Exam:   Mental Status: Alert, oriented to person, hospital, month.  Unable to specify season. His mentation was slow when asked to do physical tasks.  Cranial Nerves:   II: Visual fields grossly intact.  III/IV/VI: Extraocular movements intact.  Pupils reactive bilaterally.  V/VII: Smile symmetric. Facial light touch sensation normal bilaterally.  VIII: Grossly intact.  IX/X: Normal gag.  XI: Bilateral shoulder shrug normal.    XII: Midline tongue extension normal.  Motor:  5/5 bilateral UE and LE. Muscular rigidity noted   Sensory:  Light touch intact throughout, bilaterally  Cerebellar: Normal finger-to-nose normal although requires redirection to perform tasks.    Labs:  Basic Metabolic Panel:  Lab 06/04/12 2956  NA 141  K 4.0  CL 106  CO2 27  GLUCOSE 124*  BUN 28*  CREATININE 1.01  CALCIUM 9.4  MG --  PHOS --    Liver Function Tests:  Lab 06/04/12 1141  AST 10  ALT <5  ALKPHOS 86  BILITOT 0.2*  PROT 5.5*  ALBUMIN 3.5    Lab 06/04/12 1135  AMMONIA 22   CBC:  Lab 06/04/12 1141  WBC 9.7  NEUTROABS --  HGB 13.3  HCT 40.8  MCV 94.0  PLT 259   CBG:  Lab 06/06/12 0703 06/05/12 2009 06/05/12 1613 06/05/12 1155 06/05/12 0344  GLUCAP 95 109* 104* 101* 104*    Microbiology: Results for orders placed during the hospital encounter of 06/04/12  MRSA PCR SCREENING     Status: Normal   Collection Time   06/04/12 11:55 AM      Component Value Range Status Comment   MRSA by PCR NEGATIVE  NEGATIVE Final    Urinalysis:  Ref. Range 06/05/2012 08:11  Color, Urine Latest Range: YELLOW  YELLOW  APPearance Latest Range: CLEAR  CLOUDY (A)  Specific Gravity, Urine Latest Range: 1.005-1.030  1.018  pH Latest Range: 5.0-8.0  7.5  Glucose, UA Latest Range: NEGATIVE mg/dL NEGATIVE  Bilirubin Urine Latest  Range: NEGATIVE  NEGATIVE  Ketones, ur Latest Range: NEGATIVE mg/dL NEGATIVE  Protein Latest Range: NEGATIVE mg/dL NEGATIVE  Urobilinogen, UA Latest Range: 0.0-1.0 mg/dL 0.2  Nitrite Latest Range: NEGATIVE  POSITIVE (A)  Leukocytes, UA Latest Range: NEGATIVE  LARGE (A)  Hgb urine dipstick Latest Range: NEGATIVE  NEGATIVE  Urine-Other No range found MUCOUS PRESENT  WBC, UA Latest Range: <3 WBC/hpf TOO NUMEROUS TO COUNT  RBC / HPF Latest Range: <3 RBC/hpf 0-2  Bacteria, UA Latest Range: RARE  FEW (A)  Crystals Latest Range: NEGATIVE  CA OXALATE CRYSTALS (A)    Imaging:  Dg Chest 2 View  (06/04/2012) - Cardiomegaly.  No acute findings.  Original Report Authenticated By: Cyndie Chime, M.D.   Mr Brain Wo Contrast (06/04/2012) - Motion degraded exam.  No acute infarct.  No intracranial hemorrhage.  Global atrophy.  Ventricular prominence may be related to atrophy although difficult to completely exclude a mild component of hydrocephalus.  Mega cisterna magna.  Mild small vessel disease type changes.  Prominent ectasia of the right internal carotid artery supraclinoid segment with superior displacement of the ectatic right anterior cerebral artery causing compression of the undersurface of the ventricle and the right optic nerve with mild deformity of the optic chiasm.  Paranasal sinus mild mucosal thickening with polypoid appearance inferior right maxillary sinus.  Bilateral mastoid air cell opacification.  Original Report Authenticated By: Fuller Canada, M.D.   EEG (06/05/2012) - This routine EEG done with the patient awake and drowsy is abnormal. Background activities in the theta range suggest a mild encephalopathy of nonspecific etiology. No interictal epileptiform discharges or electrographic seizures were seen.    Medications:    Infusions:    Scheduled Medications:    . amLODipine  5 mg Oral Daily  . aspirin EC  81 mg Oral Daily  . carbidopa-levodopa  1 tablet Oral TID  . cefTRIAXone (ROCEPHIN)  IV  1 g Intravenous Q24H  . enoxaparin  40 mg Subcutaneous Q24H  . entacapone  200 mg Oral TID  . hydrochlorothiazide  25 mg Oral Daily  . insulin aspart  0-9 Units Subcutaneous TID WC  . lamoTRIgine  150 mg Oral BID  . lithium carbonate  300 mg Oral Q12H  . LORazepam  2 mg Intravenous Once  . LORazepam  0.5 mg Oral QHS  . potassium chloride  20 mEq Oral Daily  . ramipril  5 mg Oral BID  . sertraline  100 mg Oral Daily  . sodium chloride  3 mL Intravenous Q12H  . DISCONTD: insulin aspart  0-9 Units Subcutaneous Q4H    PRN Medications: sodium chloride, acetaminophen,  acetaminophen, sodium chloride   Assessment/ Plan:    Assessment: 77 YO male with PD, confusion and recent mental decline since last hospitalization of 03/2012.  MRI shows no acute stroke and global atrophy.  CT of head on 05/17/12 showed ventriculomegaly and possibly NPH.  Patient underwent a high volume LP on 05/31/12 however did not improve after LP. Spine fluid analysis was unremarkable other than traumatic tap with RBC 2600, normal glucose, protein 77 and 3 WBC. Etiology of mental decline is unclear at this time and may be a progression of his parkinson's disease.    Plan: 1) Parkinson Disease - patient has significant baseline deficits requiring help with all ADL/ IADLs with caregivers. At this point, the patient lives alone with home health services. Ex-wife is his power of attorney. - Continue home dose of sinemet.   -  PT / OT rec short term SNF, then continued 24h supervision.  2) Confusion and mental status decline - unclear etiology, likely residual deficit following recent acute illness requiring hospitalization compounding upon his baseline PD with significant deficits. Possibly affected by UTI noted on UA. Notably, EEG unremarkable for evidence of seizure activity. B12, folate, ammonia, TSH levels within normal limits. RPR negative. - Tx for UTI. - May need adjustment of diabetic regimen as an outpatient given that A1c is 4.9.  3) Complicated UTI - in patient that has chronic indwelling foley catheter determined by his outpatient urologist. In 2012 had multiple Pseudomonas UTI. Is not acutely septic. - Continue empiric Rocephin. - Await urine culture - will adjust medication regimen as indicated pending sensitivities.  4) Bipolar disorder - continue home medications.  5) Depression - continue home setraline.  6) DMII - A1c 4.8 - only on metformin at home.  - Continue SSI at present. - May need outpatient adjustment of regimen, may not need as tight blood sugar control given  risk of hypoglycemia can be more detrimental to this patient than mild hyperglycemia.  7) Dispo - PT / OT recommend SNF placement. SW is working on placement. Awaiting placement and sensitivities from urine culture.   Patient history and plan of care reviewed with attending, Dr. Thana Farr.   Signed: Johnette Abraham, D.ODonnajean Lopes, Internal Medicine Resident Pager: [phone] (7AM-5PM) 06/06/2012, 8:28 AM    Patient seen and examined. Hospital course and management issues discussed at length.  I agree with the above.  Thana Farr, MD [facility] [phone]  06/06/2012  5:20 PM

## 2012-06-06 NOTE — Progress Notes (Signed)
Physical Therapy Treatment Patient Details Name: [name] MRN: [id number] DOB: 04/15/1935 Today's Date: 06/06/2012 Time: 4098-1191 PT Time Calculation (min): 26 min  PT Assessment / Plan / Recommendation Comments on Treatment Session  Pt showing much improvement this session and able to ambulate with +2 (A).  Pt may benefit from further rehab however if pt refuses then will need continued 24 hour (A) with HHPT.  May benefit from OPPT if transportation is not an issue.  Will continue to assess d/c plans.    Follow Up Recommendations  Skilled nursing facility    Barriers to Discharge        Equipment Recommendations  Defer to next venue (Simultaneous filing. User may not have seen previous data.)    Recommendations for Other Services    Frequency Min 3X/week   Plan Discharge plan remains appropriate;Frequency remains appropriate    Precautions / Restrictions Precautions Precautions: Fall Restrictions Weight Bearing Restrictions: No   Pertinent Vitals/Pain No c/o pain    Mobility  Bed Mobility Bed Mobility: Supine to Sit;Sitting - Scoot to Edge of Bed Supine to Sit: 4: Min assist Sitting - Scoot to Edge of Bed: 4: Min assist Details for Bed Mobility Assistance: Assist to support trunk. Pt with posterior lean when trying to scoot EOB. Transfers Transfers: Sit to Stand;Stand to Sit Sit to Stand: 1: +2 Total assist;From bed;From chair/3-in-1;With armrests;With upper extremity assist Sit to Stand: Patient Percentage: 70% Stand to Sit: 3: Mod assist;To chair/3-in-1;With armrests;With upper extremity assist Details for Transfer Assistance: (A) due to trunk and hip extension during transfer.   Ambulation/Gait Ambulation/Gait Assistance: 1: +2 Total assist Ambulation/Gait: Patient Percentage: 70% Ambulation Distance (Feet): 150 Feet Assistive device: Rolling walker Ambulation/Gait Assistance Details: +2 (A) to manage RW, recliner to follow and maintain patients balance.   Used beat to improve parkinson like gait pattern.  Recommend using metronome next session.  Pt continues to lean posterior with ambulation and fasticinating gait pattern. Gait Pattern: Trunk flexed;Narrow base of support;Decreased stride length;Step-through pattern;Shuffle;Lateral trunk lean to right;Festinating    Exercises     PT Diagnosis:    PT Problem List:   PT Treatment Interventions:     PT Goals Acute Rehab PT Goals PT Goal Formulation: With patient Time For Goal Achievement: 06/19/12 Potential to Achieve Goals: Good Pt will go Supine/Side to Sit: with modified independence PT Goal: Supine/Side to Sit - Progress: Progressing toward goal Pt will go Sit to Supine/Side: with modified independence PT Goal: Sit to Supine/Side - Progress: Progressing toward goal Pt will go Sit to Stand: with supervision PT Goal: Sit to Stand - Progress: Progressing toward goal Pt will go Stand to Sit: with supervision PT Goal: Stand to Sit - Progress: Progressing toward goal Pt will Transfer Bed to Chair/Chair to Bed: with min assist PT Transfer Goal: Bed to Chair/Chair to Bed - Progress: Progressing toward goal Pt will Stand: with supervision;3 - 5 min;with bilateral upper extremity support PT Goal: Stand - Progress: Progressing toward goal Pt will Ambulate: 1 - 15 feet;with rolling walker;with mod assist PT Goal: Ambulate - Progress: Partly met  Visit Information  Last PT Received On: 06/06/12 Assistance Needed: +2    Subjective Data  Subjective: "I'm afraid I'm going to disappoint people." Patient Stated Goal: Pt continues to want to return home.   Cognition  Overall Cognitive Status: Impaired Behavior During Session: Anxious Cognition - Other Comments: Pt anxious at start of session and reports he frequently "pyches" himself out.  Pt also  reports that he hates when people ask him questions about where he is (orientation questions).  His aide, [name], reports that he was only disoriented  to time/date earlier this morning.    Balance  Balance Balance Assessed: Yes Static Standing Balance Static Standing - Balance Support: Bilateral upper extremity supported Static Standing - Level of Assistance: 3: Mod assist Static Standing - Comment/# of Minutes: ~2 min. Required manual and tactile facilitation through bil. posterior hips due to posterior lean. Dynamic Standing Balance Dynamic Standing - Balance Support: Left upper extremity supported Dynamic Standing - Level of Assistance: 3: Mod assist Dynamic Standing - Balance Activities: Forward lean/weight shifting;Lateral lean/weight shifting Dynamic Standing - Comments: Pt with some unsteadiness when reaching anteriorly and laterally during functional tasks.  End of Session PT - End of Session Equipment Utilized During Treatment: Gait belt Activity Tolerance: Patient tolerated treatment well Patient left: in chair;with call bell/phone within reach Nurse Communication: Mobility status    Dawid Dupriest 06/06/2012, 10:21 AM Jake Shark, PT DPT [phone]

## 2012-06-06 NOTE — Clinical Social Work Note (Signed)
CSW met with pt and care taker to provide bed offers. Pt's POA will return in the morning as she is unable to be reached because she is at work. CSW will follow up with pt's POA regarding bed offers.   Dede Query, MSW, [facility] [phone]

## 2012-06-06 NOTE — Progress Notes (Signed)
Occupational Therapy Treatment Patient Details Name: [name] MRN: [id number] DOB: Jan 16, 1935 Today's Date: 06/06/2012 Time: 1914-7829 OT Time Calculation (min): 26 min  OT Assessment / Plan / Recommendation Comments on Treatment Session Pt progressing well towards goals.  Pt able to perform functional ambulation with +2 assist for balance.  Pt very anxious about being able to leave hospital.      Follow Up Recommendations  Skilled nursing facility    Barriers to Discharge       Equipment Recommendations  Defer to next venue    Recommendations for Other Services    Frequency Min 2X/week   Plan Discharge plan remains appropriate    Precautions / Restrictions Precautions Precautions: Fall Restrictions Weight Bearing Restrictions: No   Pertinent Vitals/Pain See vitals    ADL  Grooming: Performed;Wash/dry hands;Minimal assistance Where Assessed - Grooming: Supported standing Toilet Transfer: Teacher, early years/pre: Patient Percentage: 70% Statistician Method:  (ambulating) Acupuncturist: Other (comment) (recliner) Transfers/Ambulation Related to ADLs: Pt ambulated throughout room and hallway with +2 total pt 70% with chair following.  Pt with narrow base of support and frequently scissors during gait.   ADL Comments: While performing grooming task at sink, pt required cueing to use hand soap rather than toothpaste to wash hands.    OT Diagnosis:    OT Problem List:   OT Treatment Interventions:     OT Goals ADL Goals Pt Will Perform Grooming: with supervision;Standing at sink ADL Goal: Grooming - Progress: Progressing toward goals Pt Will Transfer to Toilet: with supervision;with DME;3-in-1 ADL Goal: Toilet Transfer - Progress: Progressing toward goals  Visit Information  Last OT Received On: 06/06/12 Assistance Needed: +2    Subjective Data      Prior Functioning       Cognition  Overall Cognitive Status:  Impaired Behavior During Session: Anxious Cognition - Other Comments: Pt anxious at start of session and reports he frequently "pyches" himself out.  Pt also reports that he hates when people ask him questions about where he is (orientation questions).  His aide, [name], reports that he was only disoriented to time/date earlier this morning.    Mobility Bed Mobility Bed Mobility: Supine to Sit;Sitting - Scoot to Edge of Bed Supine to Sit: 4: Min assist Sitting - Scoot to Edge of Bed: 4: Min assist Details for Bed Mobility Assistance: Assist to support trunk. Pt with posterior lean when trying to scoot EOB. Transfers Transfers: Sit to Stand;Stand to Sit Sit to Stand: 1: +2 Total assist;From bed;From chair/3-in-1;With armrests;With upper extremity assist Sit to Stand: Patient Percentage: 70% Stand to Sit: 3: Mod assist;To chair/3-in-1;With armrests;With upper extremity assist Details for Transfer Assistance: (A) due to trunk and hip extension during transfer.     Exercises    Balance Static Standing Balance Static Standing - Balance Support: Bilateral upper extremity supported Static Standing - Level of Assistance: 3: Mod assist Static Standing - Comment/# of Minutes: ~2 min. Required manual and tactile facilitation through bil. posterior hips due to posterior lean. Dynamic Standing Balance Dynamic Standing - Balance Support: Left upper extremity supported Dynamic Standing - Level of Assistance: 3: Mod assist Dynamic Standing - Balance Activities: Forward lean/weight shifting;Lateral lean/weight shifting Dynamic Standing - Comments: Pt with some unsteadiness when reaching anteriorly and laterally during functional tasks.  End of Session OT - End of Session Equipment Utilized During Treatment: Gait belt Activity Tolerance: Patient tolerated treatment well Patient left: in chair;with call bell/phone within reach;Other (comment) (with  aide [name] in room) Nurse Communication: Mobility status   06/06/2012 [name] OTR/L Pager [phone] Office [phone]  [name] 06/06/2012, 10:17 AM

## 2012-06-07 ENCOUNTER — Emergency Department (HOSPITAL_COMMUNITY): Payer: Medicare Other

## 2012-06-07 ENCOUNTER — Encounter (HOSPITAL_COMMUNITY): Payer: Self-pay | Admitting: *Deleted

## 2012-06-07 ENCOUNTER — Emergency Department (HOSPITAL_COMMUNITY)
Admission: EM | Admit: 2012-06-07 | Discharge: 2012-06-08 | Disposition: A | Discharge status: Skilled Nursing Facility | Payer: Medicare Other | Attending: Emergency Medicine | Admitting: Emergency Medicine

## 2012-06-07 DIAGNOSIS — F068 Other specified mental disorders due to known physiological condition: Secondary | ICD-10-CM | POA: Insufficient documentation

## 2012-06-07 DIAGNOSIS — G20 Parkinson's disease: Secondary | ICD-10-CM | POA: Insufficient documentation

## 2012-06-07 DIAGNOSIS — W19XXXA Unspecified fall, initial encounter: Secondary | ICD-10-CM

## 2012-06-07 DIAGNOSIS — S12100A Unspecified displaced fracture of second cervical vertebra, initial encounter for closed fracture: Secondary | ICD-10-CM

## 2012-06-07 DIAGNOSIS — S0083XA Contusion of other part of head, initial encounter: Secondary | ICD-10-CM | POA: Insufficient documentation

## 2012-06-07 DIAGNOSIS — G20A1 Parkinson's disease without dyskinesia, without mention of fluctuations: Secondary | ICD-10-CM | POA: Insufficient documentation

## 2012-06-07 DIAGNOSIS — F0390 Unspecified dementia without behavioral disturbance: Secondary | ICD-10-CM

## 2012-06-07 DIAGNOSIS — N390 Urinary tract infection, site not specified: Secondary | ICD-10-CM

## 2012-06-07 DIAGNOSIS — E782 Mixed hyperlipidemia: Secondary | ICD-10-CM

## 2012-06-07 DIAGNOSIS — S0181XA Laceration without foreign body of other part of head, initial encounter: Secondary | ICD-10-CM

## 2012-06-07 DIAGNOSIS — IMO0002 Reserved for concepts with insufficient information to code with codable children: Secondary | ICD-10-CM | POA: Insufficient documentation

## 2012-06-07 DIAGNOSIS — S0003XA Contusion of scalp, initial encounter: Secondary | ICD-10-CM | POA: Insufficient documentation

## 2012-06-07 DIAGNOSIS — I10 Essential (primary) hypertension: Secondary | ICD-10-CM | POA: Insufficient documentation

## 2012-06-07 DIAGNOSIS — Z23 Encounter for immunization: Secondary | ICD-10-CM | POA: Insufficient documentation

## 2012-06-07 DIAGNOSIS — S0180XA Unspecified open wound of other part of head, initial encounter: Secondary | ICD-10-CM | POA: Insufficient documentation

## 2012-06-07 DIAGNOSIS — Z79899 Other long term (current) drug therapy: Secondary | ICD-10-CM | POA: Insufficient documentation

## 2012-06-07 DIAGNOSIS — E119 Type 2 diabetes mellitus without complications: Secondary | ICD-10-CM | POA: Insufficient documentation

## 2012-06-07 LAB — URINALYSIS, ROUTINE W REFLEX MICROSCOPIC
Bilirubin Urine: NEGATIVE
Nitrite: NEGATIVE
Specific Gravity, Urine: 1.022 (ref 1.005–1.030)
Urobilinogen, UA: 0.2 mg/dL (ref 0.0–1.0)
pH: 7 (ref 5.0–8.0)

## 2012-06-07 LAB — POCT I-STAT, CHEM 8
BUN: 27 mg/dL — ABNORMAL HIGH (ref 6–23)
Calcium, Ion: 1.28 mmol/L (ref 1.12–1.32)
Chloride: 106 mEq/L (ref 96–112)
Creatinine, Ser: 1.2 mg/dL (ref 0.50–1.35)
Sodium: 139 mEq/L (ref 135–145)

## 2012-06-07 LAB — URINE MICROSCOPIC-ADD ON

## 2012-06-07 LAB — CBC
HCT: 42.9 % (ref 39.0–52.0)
Hemoglobin: 14.2 g/dL (ref 13.0–17.0)
MCV: 92.3 fL (ref 78.0–100.0)
WBC: 8.1 10*3/uL (ref 4.0–10.5)

## 2012-06-07 LAB — GLUCOSE, CAPILLARY
Glucose-Capillary: 88 mg/dL (ref 70–99)
Glucose-Capillary: 105 mg/dL — ABNORMAL HIGH (ref 70–99)

## 2012-06-07 LAB — BASIC METABOLIC PANEL
BUN: 23 mg/dL (ref 6–23)
CO2: 27 mEq/L (ref 19–32)
Chloride: 103 mEq/L (ref 96–112)
GFR calc Af Amer: >90 mL/min (ref >90)
Potassium: 4.2 mEq/L (ref 3.5–5.1)

## 2012-06-07 MED ORDER — SULFAMETHOXAZOLE-TMP DS 800-160 MG PO TABS: 1.0000 | ORAL_TABLET | Freq: Two times a day (BID) | ORAL | Status: DC

## 2012-06-07 MED ORDER — TETANUS-DIPHTH-ACELL PERTUSSIS 5-2.5-18.5 LF-MCG/0.5 IM SUSP
0.5000 mL | Freq: Once | INTRAMUSCULAR | Status: AC
  Administered 2012-06-07: 0.5 mL via INTRAMUSCULAR
  Filled 2012-06-07: qty 0.5

## 2012-06-07 MED ORDER — SULFAMETHOXAZOLE-TMP DS 800-160 MG PO TABS
1.0000 | ORAL_TABLET | Freq: Two times a day (BID) | ORAL | Status: DC
  Administered 2012-06-07: 1 via ORAL
  Filled 2012-06-07 (×2): qty 1

## 2012-06-07 NOTE — ED Notes (Signed)
Phlebotomy notified to collect istat.

## 2012-06-07 NOTE — Progress Notes (Signed)
Clinical Psychologist, occupational summary to [employer]  CSW confirmed with RN Morrie Sheldon that pt was ready for transport.  CSW phoned transport.  CSW to sign off at time of dc.    Angelia Mould, MSW, [place] [phone]

## 2012-06-07 NOTE — Progress Notes (Signed)
Discharged to skilled nursing facility. Iv discontinued.

## 2012-06-07 NOTE — ED Notes (Signed)
Bed:[facility]<BR> Expected date:06/07/12<BR> Expected time: 8:35 PM<BR> Means of arrival:Ambulance<BR> Comments:<BR> Fall; elderly

## 2012-06-07 NOTE — Discharge Summary (Signed)
[facility] Discharge Summary   Patient Name:  [name]  MRN: [id number]  PCP: [name], MD  DOB:  [birth year]       Date of Admission:  06/04/2012  Date of Discharge:  06/07/2012      Attending Physician: Dr. Thana Farr         DISCHARGE DIAGNOSES: 1) Parkinson Disease - at baseline requires assistance with all ADL/ IADLS. Followed by Dr. Anne Hahn [facility]) 2) AMS - Likely secondary to acute UTI on top of baseline deficits secondary to #1. Notably, EEG unremarkable for evidence of seizure activity. B12, folate, ammonia, TSH levels within normal limits. RPR negative.  3) Complicated S. Aureus UTI - treated with 3 days of Rocephin, changed to Bactrim on day of discharge after Urine culture grew S. Aureus.  4) Bipolar disorder - continue home medications.  5) Depression - continue home setraline.  6) DMII - A1c 4.8 - only on metformin at home. Continued home medications, will need continued followup as an outpatient.   DISPOSITION AND FOLLOW-UP: [name] is to follow-up with the listed providers as detailed below, at which time, the following should be addressed:   1. PCP / Urologist - please assess need for ongoing indwelling foley catheter. 2. Labs / imaging needed: CBGs and follow-up HgbA1c in 3 months to assess if patient needs to remain on metformin therapy. 3. Pending labs/ test needing follow-up: Urine culture sensitivities - this will help guide adjustment of antibiotic therapy as indicated.  Follow-up Information    Follow up with Lesly Dukes, MD. (Please follow-up with Dr. Anne Hahn at your next regularly scheduled appointment.)    Contact information:   [address], [address] [place] [address] [facility] [address] [phone]       Follow up with [name], MD. (1-2 weeks. Please call to make an appointment,)    Contact information:   [address] [place]  [place] [address] [phone]         Discharge Orders    Future Orders Please Complete By Expires   Diet - low sodium heart healthy      Increase activity slowly      Other Restrictions      Comments:   Walk with assistance as guided by PT/ OT at your facility   Call MD for:  temperature >100.4      Call MD for:  persistant nausea and vomiting      Call MD for:  severe uncontrolled pain      Call MD for:  persistant dizziness or light-headedness        DISCHARGE MEDICATIONS: Medication List  As of 06/07/2012  2:14 PM   TAKE these medications         amLODipine 5 MG tablet   Commonly known as: NORVASC   Take 5 mg by mouth daily.      aspirin 81 MG tablet   Take 81 mg by mouth daily.      carbidopa-levodopa 25-250 MG per tablet   Commonly known as: SINEMET IR   Take 1 tablet by mouth 3 (three) times daily.      clotrimazole-betamethasone cream   Commonly known as: LOTRISONE   Apply 1 application topically 2 (two) times daily.      entacapone 200 MG tablet   Commonly known as: COMTAN   Take 200 mg by mouth 3 (three) times daily.      hydrochlorothiazide 25 MG tablet  Commonly known as: HYDRODIURIL   Take 25 mg by mouth daily.      lamoTRIgine 150 MG tablet   Commonly known as: LAMICTAL   Take 150 mg by mouth 2 (two) times daily.      lithium carbonate 300 MG capsule   TAKE ONE CAPSULE BY MOUTH TWICE DAILY WITH MEAL.      LORazepam 0.5 MG tablet   Commonly known as: ATIVAN   Take 1 tablet (0.5 mg total) by mouth daily at 12 noon.      metFORMIN 500 MG tablet   Commonly known as: GLUCOPHAGE   Take 500 mg by mouth 2 (two) times daily with a meal.      multivitamins ther. w/minerals Tabs   Take 1 tablet by mouth daily.      omeprazole 20 MG capsule   Commonly known as: PRILOSEC   Take 20 mg by mouth daily.      potassium chloride SA 20 MEQ tablet   Commonly known as: K-DUR,KLOR-CON   Take 20 mEq by mouth daily.      ramipril 5 MG tablet   Commonly  known as: ALTACE   Take 5 mg by mouth 2 (two) times daily.      RED YEAST RICE PO   Take 2 tablets by mouth at bedtime.      sertraline 100 MG tablet   Commonly known as: ZOLOFT   TAKE ONE TABLET BY MOUTH ONE TIME DAILY      sulfamethoxazole-trimethoprim 800-160 MG per tablet   Commonly known as: BACTRIM DS   Take 1 tablet by mouth every 12 (twelve) hours.      temazepam 30 MG capsule   Commonly known as: RESTORIL   Take 1 capsule (30 mg total) by mouth at bedtime as needed for sleep.      temazepam 30 MG capsule   Commonly known as: RESTORIL   Take 1 capsule (30 mg total) by mouth at bedtime as needed for sleep.             CONSULTS:  None.   PROCEDURES PERFORMED:   Dg Chest 2 View (06/04/2012) - Cardiomegaly.  No acute findings.  Original Report Authenticated By: Cyndie Chime, M.D.   Ct Head Wo Contrast (05/24/2012) -  This examination was performed at [facility] at [address] [facility]. The interpretation will be provided by [facility]  Original Report Authenticated By: Jamesetta Orleans. MATTERN, M.D.   Mr Brain Wo Contrast (06/04/2012) - Motion degraded exam.  No acute infarct.  No intracranial hemorrhage.  Global atrophy.  Ventricular prominence may be related to atrophy although difficult to completely exclude a mild component of hydrocephalus.  Mega cisterna magna.  Mild small vessel disease type changes.  Prominent ectasia of the right internal carotid artery supraclinoid segment with superior displacement of the ectatic right anterior cerebral artery causing compression of the undersurface of the ventricle and the right optic nerve with mild deformity of the optic chiasm.  Paranasal sinus mild mucosal thickening with polypoid appearance inferior right maxillary sinus.  Bilateral mastoid air cell opacification.  Original Report Authenticated By: Fuller Canada, M.D.   Dg Fluoro Guide Lumbar Puncture (05/31/2012) - 25 ml of CSF obtained for  requested studies.  Opening pressure was quite low, 10 centimeters of water or less.  See above discussion.  Original Report Authenticated By: Thomasenia Sales, M.D.      ADMISSION DATA: H&P: MAYA SCHOLER is an 77 y.o.  male who is primary neurology patient of Dr. Anne Hahn of [facility]. Patient has a known history of PD, bipolar, anxiety and depression. Patient was hospitalized in 03/2012 for PNA and UTI.  He was discharged on 04/16/2012.  Since he was discharged he has not fully returned to his mental baseline and function level continued to decline. CT of head on 05/17/12 showed ventriculomegaly and possibly NPH. Patient underwent a high volume LP on 05/31/12 however did not improve after LP.  Spine fluid analysis was unremarkable other than traumatic tap with RBC 2600, normal glucose, protein 77 and 3 WBC. Patient was seen in out patient office with Dr. Anne Hahn 06/03/12 where his mental and functional decline continued to be noted.  Patient was admitted to [facility] hospital for further evaluation and possible need for placement.   Physical Exam: General Exam: HEENT-  Normocephalic, no lesions, without obvious abnormality.  Normal external eye and conjunctiva.  Normal TM's bilaterally.  Normal auditory canals and external ears. Normal external nose, mucus membranes and septum.  Normal pharynx. Neck supple with no masses, nodes, nodules or enlargement. Cardiovascular- S1, S2 normal Lungs- chest clear, no wheezing, rales, normal symmetric air entry, Heart exam - S1, S2 normal, no murmur, no gallop, rate regular Abdomen- soft, non-tender; bowel sounds normal; no masses,  no organomegaly Extremities- no joint deformities, effusion, or inflammation  Neurologic Examination: Mental Status: Alert, oriented to hospital and year.  Could not tell me month, city, state, president, or floor. His mentation was slow when asked to do physical tasks.  Speech fluent without evidence of aphasia. Able to follow simple commands  without difficulty. Named 4 animals in 1 minute.  Able to follow through with 2/3 step command.  Able to name objects and tell me the purpose of object.   Cranial Nerves: II-Visual fields grossly intact. III/IV/VI-Extraocular movements intact.  Pupils reactive bilaterally. V/VII-Smile symmetric VIII-grossly intact IX/X-normal gag XI-bilateral shoulder shrug XII-midline tongue extension Motor: 5/5 bilaterally with normal tone and bulk Sensory: Pinprick and light touch intact throughout, bilaterally Deep Tendon Reflexes: 1+ and symmetric throughout Plantars downgoing right and up going left Cerebellar: Normal finger-to-nose smooth, normal heel-to-shin test showed no dysmetria.    Gait: shuffled steps and tendency to fall backwards  Labs:  Ref. Range 06/04/2012 11:41  Sodium Latest Range: 135-145 mEq/L 141  Potassium Latest Range: 3.5-5.1 mEq/L 4.0  Chloride Latest Range: 96-112 mEq/L 106  CO2 Latest Range: 19-32 mEq/L 27  Mean Plasma Glucose Latest Range: <117 mg/dL 94  BUN Latest Range: 1-61 mg/dL 28 (H)  Creat Latest Range: 0.50-1.35 mg/dL 0.96  Calcium Latest Range: 8.4-10.5 mg/dL 9.4  GFR calc non Af Amer Latest Range: >90 mL/min 71 (L)  GFR calc Af Amer Latest Range: >90 mL/min 82 (L)  Glucose Latest Range: 70-99 mg/dL 045 (H)  Alkaline Phosphatase Latest Range: 39-117 U/L 86  Albumin Latest Range: 3.5-5.2 g/dL 3.5  AST Latest Range: 0-37 U/L 10  ALT Latest Range: 0-53 U/L <5  Total Protein Latest Range: 6.0-8.3 g/dL 5.5 (L)  Total Bilirubin Latest Range: 0.3-1.2 mg/dL 0.2 (L)  Vitamin W-09 Latest Range: 211-911 pg/mL 1605 (H)  WBC Latest Range: 4.0-10.5 K/uL 9.7  RBC Latest Range: 4.22-5.81 MIL/uL 4.34  Hemoglobin Latest Range: 13.0-17.0 g/dL 81.1  HCT Latest Range: 39.0-52.0 % 40.8  MCV Latest Range: 78.0-100.0 fL 94.0  MCH Latest Range: 26.0-34.0 pg 30.6  MCHC Latest Range: 30.0-36.0 g/dL 91.4  RDW Latest Range: 11.5-15.5 % 14.1  Platelets Latest Range: 150-400 K/uL  259  Hemoglobin A1C Latest Range: <5.7 % 4.9  TSH Latest Range: 0.350-4.500 uIU/mL 2.396    HOSPITAL COURSE: 1) Parkinson Disease - patient has baseline Parkinson disease with patient has significant baseline deficits requiring help with all ADL/ IADLs with caregivers. He is followed as an outpatient by his primary neurologist Dr. Anne Hahn, who felt that after most recent hospitalization in 03/2012, the patient patient did not return to baseline functional status. Recent CT head was concerning for possible NPH, therefore, a high volume LP was performed on 05/31/2012 (priro to admission), which was not indicative of infectious cause, and despite which, the patient had no functional improvement. Therefore, he was admitted, with concern for home safety and need for SNF placement to help the patient regain some of his baseline functions.PT/ OT evaluated the patient and agreed that at least short term SNF would be beneficial. He was otherwise continued on his home sinemet and will follow as an outpatient with Dr. Anne Hahn at next regularly scheduled appointment.  2) Altered mental status - unclear etiology, likely residual deficit following recent acute illness requiring hospitalization compounding upon his baseline PD with significant deficits. As UA did indicate UTI, the patient was treated for his acute infection. Notably, recent outpatient EEG unremarkable for evidence of seizure activity. B12, folate, ammonia, TSH levels were checked and were within normal limits. RPR negative. Patient's mental status was stable during hospital course, without significant change from time of admission.  3) Complicated S. Aureus UTI - in patient that has chronic indwelling foley catheter determined by his outpatient urologist. In 2012 had multiple Pseudomonas UTI. After results of urinalysis were presented, patient was empirically started on Rocephin therapy, however, urine culture resulted on day of discharge showing S.  Aureus. Therefore, Rocephin was discontinued, and the patient will be discharged on a 7 day course of Bactrim DS BID. The urine culture sensitivities will need to be followed up as an outpatient at his skilled nursing facility to see if additional changes to therapy are needed.  4) Bipolar disorder - well controlled during hospital course. Continue home medications.  5) Depression - well controlled during hospital course. Continue home medications.  6) DMII, controlled - A1c 4.8 during this hospital course. At home, the patient was only on metformin at home.  Secondary to requirement for contrasted studies, Metformin was held during hospital course, and patient was continued on sliding scale insulin, with excellent blood sugar control (90-110 mg/dL).    DISCHARGE DATA: Vital Signs: BP 127/80  Pulse 51  Temp 98.5 F (36.9 C) (Oral)  Resp 18  Ht 5\' 10"  (1.778 m)  Wt 138 lb 11.2 oz (62.914 kg)  BMI 19.90 kg/m2  SpO2 98%  Labs: Results for orders placed during the hospital encounter of 06/04/12 (from the past 24 hour(s))  GLUCOSE, CAPILLARY     Status: Abnormal   Collection Time   06/06/12  4:33 PM      Component Value Range   Glucose-Capillary 132 (*) 70 - 99 mg/dL  GLUCOSE, CAPILLARY     Status: Abnormal   Collection Time   06/06/12  9:51 PM      Component Value Range   Glucose-Capillary 102 (*) 70 - 99 mg/dL   Comment 1 Notify RN     Comment 2 Documented in Chart    GLUCOSE, CAPILLARY     Status: Normal   Collection Time   06/07/12  7:07 AM      Component Value Range   Glucose-Capillary 88  70 -  99 mg/dL   Comment 1 Documented in Chart     Comment 2 Notify RN    BASIC METABOLIC PANEL     Status: Abnormal   Collection Time   06/07/12  8:50 AM      Component Value Range   Sodium 138  135 - 145 mEq/L   Potassium 4.2  3.5 - 5.1 mEq/L   Chloride 103  96 - 112 mEq/L   CO2 27  19 - 32 mEq/L   Glucose, Bld 110 (*) 70 - 99 mg/dL   BUN 23  6 - 23 mg/dL   Creatinine, Ser 7.82   0.50 - 1.35 mg/dL   Calcium 9.6  8.4 - 95.6 mg/dL   GFR calc non Af Amer 79 (*) >90 mL/min   GFR calc Af Amer >90  >90 mL/min  CBC     Status: Normal   Collection Time   06/07/12  8:50 AM      Component Value Range   WBC 8.1  4.0 - 10.5 K/uL   RBC 4.65  4.22 - 5.81 MIL/uL   Hemoglobin 14.2  13.0 - 17.0 g/dL   HCT 21.3  08.6 - 57.8 %   MCV 92.3  78.0 - 100.0 fL   MCH 30.5  26.0 - 34.0 pg   MCHC 33.1  30.0 - 36.0 g/dL   RDW 46.9  62.9 - 52.8 %   Platelets 291  150 - 400 K/uL  GLUCOSE, CAPILLARY     Status: Abnormal   Collection Time   06/07/12  8:51 AM      Component Value Range   Glucose-Capillary 110 (*) 70 - 99 mg/dL  GLUCOSE, CAPILLARY     Status: Abnormal   Collection Time   06/07/12 11:28 AM      Component Value Range   Glucose-Capillary 105 (*) 70 - 99 mg/dL     Time spent on discharge: 35 minutes  Signed: Johnette Abraham, D.O.  PGY II, Internal Medicine Resident 06/07/2012, 2:14 PM   Patient seen and examined. I agree with the above.  Discharge to facility.  Thana Farr, MD [facility] [phone]  06/07/2012  7:58 PM

## 2012-06-07 NOTE — Progress Notes (Signed)
Clinical Social Worker met with Ms. Hach at bedside.  CSW reviewed plan of care.  No dc orders or summary at this time.  CSW communicated with RNCM and RN Morrie Sheldon re: dc orders and summary.  CSW to submit summary once available.  CSW to continue to follow and assist as needed.   Angelia Mould, MSW, [place] [phone]

## 2012-06-07 NOTE — Progress Notes (Signed)
[facility] Progress Note   Resident Note, please see attending addendum for additional details.  Subjective:    Chief Complaint: mental decline  Currently, the patient has no acute complaints, feeling well, eating, drinking, voiding, stooling well.    Interval Events: None.    Objective:    Vital Signs:   Temp:  [98.2 F (36.8 C)-98.5 F (36.9 C)] 98.5 F (36.9 C) (06/21 0600) Pulse Rate:  [51-56] 51  (06/21 0600) Resp:  [18] 18  (06/21 0600) BP: (120-172)/(69-82) 127/80 mmHg (06/21 0600) SpO2:  [97 %-99 %] 98 % (06/21 0600) Last BM Date: 06/05/12  24-hour weight change: Weight change:   Intake/Output:   Intake/Output Summary (Last 24 hours) at 06/07/12 0849 Last data filed at 06/07/12 0602  Gross per 24 hour  Intake    760 ml  Output   1300 ml  Net   -540 ml      Physical Exam:  General Exam:   General: Vital signs reviewed and noted. Well-developed, thin-appearing in no acute distress; Anxious, but appropriate and cooperative throughout examination.  Lungs:  Normal respiratory effort. Clear to auscultation BL without crackles or wheezes.  Heart: RRR. S1 and S2 normal without gallop, murmur, or rubs.  Abdomen:  BS normoactive. Soft, Nondistended, non-tender.  No masses or organomegaly.  Extremities: No pretibial edema.     Neurologic Exam:   Mental Status: Alert, oriented to person, hospital, month.   Cranial Nerves:   II: Visual fields grossly intact.  III/IV/VI: Extraocular movements intact.  Pupils reactive bilaterally.  V/VII: Smile symmetric. Facial light touch sensation normal bilaterally.  VIII: Grossly intact.  IX/X: Normal gag.  XI: Bilateral shoulder shrug normal.  XII: Midline tongue extension normal.  Motor:  5/5 bilateral UE and LE. Muscular rigidity noted   Sensory:  Light touch intact throughout, bilaterally  Cerebellar: Normal finger-to-nose normal although requires redirection to perform tasks.    Labs:  Basic  Metabolic Panel:  Lab 06/04/12 1610  NA 141  K 4.0  CL 106  CO2 27  GLUCOSE 124*  BUN 28*  CREATININE 1.01  CALCIUM 9.4  MG --  PHOS --    Liver Function Tests:  Lab 06/04/12 1141  AST 10  ALT <5  ALKPHOS 86  BILITOT 0.2*  PROT 5.5*  ALBUMIN 3.5    Lab 06/04/12 1135  AMMONIA 22   CBC:  Lab 06/04/12 1141  WBC 9.7  NEUTROABS --  HGB 13.3  HCT 40.8  MCV 94.0  PLT 259   CBG:  Lab 06/06/12 2151 06/06/12 1633 06/06/12 1113 06/06/12 0703 06/05/12 2009  GLUCAP 102* 132* 101* 95 109*    Microbiology: Results for orders placed during the hospital encounter of 06/04/12  MRSA PCR SCREENING     Status: Normal   Collection Time   06/04/12 11:55 AM      Component Value Range Status Comment   MRSA by PCR NEGATIVE  NEGATIVE Final   URINE CULTURE     Status: Normal (Preliminary result)   Collection Time   06/05/12  8:11 AM      Component Value Range Status Comment   Specimen Description URINE, RANDOM   Final    Special Requests CX ADDED 06/05/12 1230   Final    Culture  Setup Time [id number]   Final    Colony Count >=100,000 COLONIES/ML   Final    Culture STAPHYLOCOCCUS AUREUS   Final    Report Status PENDING   Incomplete  Urinalysis:  Ref. Range 06/05/2012 08:11  Color, Urine Latest Range: YELLOW  YELLOW  APPearance Latest Range: CLEAR  CLOUDY (A)  Specific Gravity, Urine Latest Range: 1.005-1.030  1.018  pH Latest Range: 5.0-8.0  7.5  Glucose, UA Latest Range: NEGATIVE mg/dL NEGATIVE  Bilirubin Urine Latest Range: NEGATIVE  NEGATIVE  Ketones, ur Latest Range: NEGATIVE mg/dL NEGATIVE  Protein Latest Range: NEGATIVE mg/dL NEGATIVE  Urobilinogen, UA Latest Range: 0.0-1.0 mg/dL 0.2  Nitrite Latest Range: NEGATIVE  POSITIVE (A)  Leukocytes, UA Latest Range: NEGATIVE  LARGE (A)  Hgb urine dipstick Latest Range: NEGATIVE  NEGATIVE  Urine-Other No range found MUCOUS PRESENT  WBC, UA Latest Range: <3 WBC/hpf TOO NUMEROUS TO COUNT  RBC / HPF Latest Range: <3  RBC/hpf 0-2  Bacteria, UA Latest Range: RARE  FEW (A)  Crystals Latest Range: NEGATIVE  CA OXALATE CRYSTALS (A)    Imaging:  Dg Chest 2 View (06/04/2012) - Cardiomegaly.  No acute findings.  Original Report Authenticated By: Cyndie Chime, M.D.   Mr Brain Wo Contrast (06/04/2012) - Motion degraded exam.  No acute infarct.  No intracranial hemorrhage.  Global atrophy.  Ventricular prominence may be related to atrophy although difficult to completely exclude a mild component of hydrocephalus.  Mega cisterna magna.  Mild small vessel disease type changes.  Prominent ectasia of the right internal carotid artery supraclinoid segment with superior displacement of the ectatic right anterior cerebral artery causing compression of the undersurface of the ventricle and the right optic nerve with mild deformity of the optic chiasm.  Paranasal sinus mild mucosal thickening with polypoid appearance inferior right maxillary sinus.  Bilateral mastoid air cell opacification.  Original Report Authenticated By: Fuller Canada, M.D.   EEG (06/05/2012) - This routine EEG done with the patient awake and drowsy is abnormal. Background activities in the theta range suggest a mild encephalopathy of nonspecific etiology. No interictal epileptiform discharges or electrographic seizures were seen.    Medications:    Infusions:    Scheduled Medications:    . amLODipine  5 mg Oral Daily  . aspirin EC  81 mg Oral Daily  . carbidopa-levodopa  1 tablet Oral TID  . cefTRIAXone (ROCEPHIN)  IV  1 g Intravenous Q24H  . enoxaparin  40 mg Subcutaneous Q24H  . entacapone  200 mg Oral TID  . hydrochlorothiazide  25 mg Oral Daily  . insulin aspart  0-9 Units Subcutaneous TID WC  . lamoTRIgine  150 mg Oral BID  . lithium carbonate  300 mg Oral Q12H  . LORazepam  2 mg Intravenous Once  . LORazepam  0.5 mg Oral QHS  . potassium chloride  20 mEq Oral Daily  . ramipril  5 mg Oral BID  . sertraline  100 mg Oral Daily  .  sodium chloride  3 mL Intravenous Q12H    PRN Medications: sodium chloride, acetaminophen, acetaminophen, sodium chloride   Assessment/ Plan:    Assessment: 77 YO male with PD, confusion and recent mental decline since last hospitalization of 03/2012.  MRI shows no acute stroke and global atrophy.  CT of head on 05/17/12 showed ventriculomegaly and possibly NPH.  Patient underwent a high volume LP on 05/31/12 however did not improve after LP. Spine fluid analysis was unremarkable other than traumatic tap with RBC 2600, normal glucose, protein 77 and 3 WBC. Etiology of mental decline is unclear at this time and may be a progression of his parkinson's disease.    Plan: 1) Parkinson Disease -  patient has significant baseline deficits requiring help with all ADL/ IADLs with caregivers. At this point, the patient lives alone with home health services. Ex-wife is his power of attorney. - Continue home dose of sinemet.   - PT / OT rec short term SNF, then continued 24h supervision.  2) Confusion and mental status decline - unclear etiology, likely residual deficit following recent acute illness requiring hospitalization compounding upon his baseline PD with significant deficits. Possibly affected by UTI noted on UA. Notably, EEG unremarkable for evidence of seizure activity. B12, folate, ammonia, TSH levels within normal limits. RPR negative. - Tx for UTI. - May need adjustment of diabetic regimen as an outpatient given that A1c is 4.9.  3) Complicated S. Aureus UTI - in patient that has chronic indwelling foley catheter determined by his outpatient urologist. In 2012 had multiple Pseudomonas UTI. Is not acutely septic. - Completed 3 day course of Rocephin, however, given rocephin is not great for Gram positive organisms, will likely need to adjust therapy that would be more targeted towards the identified species. - Will consider Bactrim. - Will adjust medication regimen as indicated pending  sensitivities.  4) Bipolar disorder - continue home medications.  5) Depression - continue home setraline.  6) DMII - A1c 4.8 - only on metformin at home.  - Continue SSI at present. - May need outpatient adjustment of regimen, may not need as tight blood sugar control given risk of hypoglycemia can be more detrimental to this patient than mild hyperglycemia.  7) Dispo - PT / OT recommend SNF placement. SW is working on placement. Awaiting placement and sensitivities from urine culture.  LOS: 3 days  Patient history and plan of care reviewed with attending, Dr. Thana Farr.  Signed: Johnette Abraham, D.ODonnajean Lopes, Internal Medicine Resident Pager: [phone] (7AM-5PM) 06/07/2012, 9:43 AM      Patient seen and examined. Hospital course and management discussed at length.  I agree with the above.  Thana Farr, MD [facility] [phone]  06/07/2012  1:00 PM

## 2012-06-07 NOTE — ED Provider Notes (Signed)
History     CSN: [id number]  Arrival date & time 06/07/12  2043   First MD Initiated Contact with Patient 06/07/12 2115      Chief Complaint  Patient presents with  . Fall  Level 5 caveat applies secondary to dementia   HPI  History provided by EMS and nursing home report. Patient is a 77 year old male with history of Parkinson's disease, hypertension, diabetes and dementia who presents after an unwitnessed fall at [facility]. History is limited secondary to patient dementia. Patient states that he was in his driveway when he fell. He is unable to give an accurate history. Patient did present with reports of laceration to her for head area. There was no reports of any concerns for alternate status the patient is new to the facility and not well known. Patient was alert and talkative. Patient does not complain of any other pain or injuries.    Past Medical History  Diagnosis Date  . Hearing aid worn   . Hypertension   . Parkinson disease   . Pneumonia   . Diabetes mellitus type II   . Arthritis   . Bipolar disorder   . Anxiety   . Depression     Past Surgical History  Procedure Date  . Total hip arthroplasty     Family History  Problem Relation Age of Onset  . Depression Father     History  Substance Use Topics  . Smoking status: Never Smoker   . Smokeless tobacco: Never Used  . Alcohol Use: Yes     occasional      Review of Systems  Unable to perform ROS: Dementia    Allergies  Cholestatin  Home Medications   Current Outpatient Rx  Name Route Sig Dispense Refill  . AMLODIPINE BESYLATE 5 MG PO TABS Oral Take 5 mg by mouth daily.      . ASPIRIN 81 MG PO TABS Oral Take 81 mg by mouth daily.      Marland Kitchen CARBIDOPA-LEVODOPA 25-250 MG PO TABS Oral Take 1 tablet by mouth 3 (three) times daily.      Marland Kitchen CLOTRIMAZOLE-BETAMETHASONE 1-0.05 % EX CREA Topical Apply 1 application topically 2 (two) times daily.     Marland Kitchen ENTACAPONE 200 MG PO TABS Oral Take 200 mg by  mouth 3 (three) times daily.     Marland Kitchen HYDROCHLOROTHIAZIDE 25 MG PO TABS Oral Take 25 mg by mouth daily.      Marland Kitchen LAMOTRIGINE 150 MG PO TABS Oral Take 150 mg by mouth 2 (two) times daily.    Marland Kitchen LITHIUM CARBONATE 300 MG PO CAPS  TAKE ONE CAPSULE BY MOUTH TWICE DAILY WITH MEAL. 60 capsule 0    Need to be seen by MD for future refill  . LORAZEPAM 0.5 MG PO TABS Oral Take 1 tablet (0.5 mg total) by mouth daily at 12 noon. 30 tablet 1  . METFORMIN HCL 500 MG PO TABS Oral Take 500 mg by mouth 2 (two) times daily with a meal.    . THERA M PLUS PO TABS Oral Take 1 tablet by mouth daily.      Marland Kitchen OMEPRAZOLE 20 MG PO CPDR Oral Take 20 mg by mouth daily.     Marland Kitchen POTASSIUM CHLORIDE CRYS ER 20 MEQ PO TBCR Oral Take 20 mEq by mouth daily.    Marland Kitchen RAMIPRIL 5 MG PO TABS Oral Take 5 mg by mouth 2 (two) times daily.      . RED YEAST RICE PO  Oral Take 2 tablets by mouth at bedtime.      . SERTRALINE HCL 100 MG PO TABS  TAKE ONE TABLET BY MOUTH ONE TIME DAILY 30 tablet 1    Please attach message for patient to make follow u ...  . SULFAMETHOXAZOLE-TMP DS 800-160 MG PO TABS Oral Take 1 tablet by mouth every 12 (twelve) hours.    Marland Kitchen TEMAZEPAM 30 MG PO CAPS Oral Take 1 capsule (30 mg total) by mouth at bedtime as needed for sleep. 30 capsule 1  . TEMAZEPAM 30 MG PO CAPS Oral Take 1 capsule (30 mg total) by mouth at bedtime as needed for sleep. 30 capsule 1    BP 161/75  Pulse 97  Temp 98.9 F (37.2 C) (Oral)  Resp 24  SpO2 100%  Physical Exam  Nursing note and vitals reviewed. Constitutional: He appears well-developed and well-nourished.       Thin and cachectic appearing  HENT:  Head: Normocephalic.       5 cm laceration to left 4 head with small underlying hematoma. No skull depression or deformities. No raccoon eyes or battle sign.  Neck: Normal range of motion. Neck supple.       Mild tenderness over the right trapezius area.  Cardiovascular: Normal rate and regular rhythm.   Pulmonary/Chest: Effort normal and  breath sounds normal. No respiratory distress.  Abdominal: Soft. There is no tenderness. There is no rebound.  Musculoskeletal: Normal range of motion. He exhibits no edema and no tenderness.       Abrasions over left dorsal hand and MCP joints. No gross deformities. Full range of motion and good strength.  Neurological: He is alert.  Skin: Skin is warm.  Psychiatric: He has a normal mood and affect.    ED Course  Procedures    LACERATION REPAIR Performed by: Angus Seller Authorized by: Angus Seller Consent: Verbal consent obtained. Risks and benefits: risks, benefits and alternatives were discussed Consent given by: patient Patient identity confirmed: provided demographic data Prepped and Draped in normal sterile fashion Wound explored  Laceration Location: left Forehead  Laceration Length: 5cm  No Foreign Bodies seen or palpated  Anesthesia: none  Irrigation method: syringe Amount of cleaning: standard  Skin closure: Skin with Stapling   Number of sutures: 3   Technique: Stapling   Patient tolerance: Patient tolerated the procedure well with no immediate complications.  Second smaller 1 cm wound inferiorly was closed with Dermabond.   Results for orders placed during the hospital encounter of 06/07/12  URINALYSIS, ROUTINE W REFLEX MICROSCOPIC      Component Value Range   Color, Urine YELLOW  YELLOW   APPearance CLOUDY (*) CLEAR   Specific Gravity, Urine 1.022  1.005 - 1.030   pH 7.0  5.0 - 8.0   Glucose, UA NEGATIVE  NEGATIVE mg/dL   Hgb urine dipstick LARGE (*) NEGATIVE   Bilirubin Urine NEGATIVE  NEGATIVE   Ketones, ur NEGATIVE  NEGATIVE mg/dL   Protein, ur 30 (*) NEGATIVE mg/dL   Urobilinogen, UA 0.2  0.0 - 1.0 mg/dL   Nitrite NEGATIVE  NEGATIVE   Leukocytes, UA LARGE (*) NEGATIVE  POCT I-STAT, CHEM 8      Component Value Range   Sodium 139  135 - 145 mEq/L   Potassium 4.4  3.5 - 5.1 mEq/L   Chloride 106  96 - 112 mEq/L   BUN 27 (*) 6 - 23  mg/dL   Creatinine, Ser 1.61  0.50 - 1.35 mg/dL  Glucose, Bld 112 (*) 70 - 99 mg/dL   Calcium, Ion 1.91  4.78 - 1.32 mmol/L   TCO2 22  0 - 100 mmol/L   Hemoglobin 15.3  13.0 - 17.0 g/dL   HCT 29.5  62.1 - 30.8 %  URINE MICROSCOPIC-ADD ON      Component Value Range   Squamous Epithelial / LPF RARE  RARE   WBC, UA 7-10  <3 WBC/hpf   RBC / HPF TOO NUMEROUS TO COUNT  <3 RBC/hpf   Bacteria, UA FEW (*) RARE   Urine-Other MUCOUS PRESENT         Ct Head Wo Contrast  06/07/2012  *RADIOLOGY REPORT*  Clinical Data:  Status post unwitnessed fall; laceration to the left forehead.  Assess for cervical spine injury.  CT HEAD WITHOUT CONTRAST AND CT CERVICAL SPINE WITHOUT CONTRAST  Technique:  Multidetector CT imaging of the head and cervical spine was performed following the standard protocol without intravenous contrast.  Multiplanar CT image reconstructions of the cervical spine were also generated.  Comparison: CT of the head performed 05/24/2012, MRI of the brain performed 06/04/2012, and CT of the cervical spine performed 09/06/2007  CT HEAD  Findings: There is no evidence of acute infarction, mass lesion, or intra- or extra-axial hemorrhage on CT.  Prominence of the ventricles and sulci reflects mild cortical volume loss.  Cerebellar atrophy is noted.  Diffuse periventricular and subcortical white matter change likely reflects small vessel ischemic microangiopathy.  The brainstem and fourth ventricle are within normal limits.  The basal ganglia are unremarkable in appearance.  The cerebral hemispheres demonstrate grossly normal gray-white differentiation. No mass effect or midline shift is seen.  There is no evidence of fracture; visualized osseous structures are unremarkable in appearance.  The orbits are within normal limits. The paranasal sinuses and mastoid air cells are well-aerated.  Soft tissue swelling is noted overlying the left frontoparietal calvarium.  IMPRESSION:  1.  No evidence of  traumatic intracranial injury or fracture. 2.  Soft tissue swelling overlying the left frontoparietal calvarium. 3.  Mild cortical volume loss and diffuse small vessel ischemic microangiopathy.  CT CERVICAL SPINE  Findings: There is a small minimally displaced fracture involving the anterior aspect of the right transverse process of C2, at the superior articular surface. This does not appear to extend into the right transverse foramen, and associated vascular injury is considered unlikely.  This is new from 2008.  No additional fractures are seen.  Vertebral bodies demonstrate normal height and alignment.  There is mild multilevel disc space narrowing along the lower cervical spine, with associated small anterior and posterior disc osteophyte complexes.  Facet disease is noted along the cervical spine.  Prevertebral soft tissues are within normal limits.  The thyroid gland is unremarkable in appearance.  The visualized lung apices are clear.  Mild scattered calcification is noted at the carotid bifurcations bilaterally.  IMPRESSION:  1.  Small minimally displaced fracture involving the anterior aspect of the right transverse process of C2, at the superior articular surface.  This does not appear to extend into the right transverse foramen, and associated vascular injury is considered unlikely. 2.  No additional fractures seen. 3.  Mild degenerative change along the lower cervical spine. 4.  Mild scattered calcification at the carotid bifurcations bilaterally.  These results were called by telephone on 06/07/2012  at  09:58 p.m. to  [facility] PA, who verbally acknowledged these results.  Original Report Authenticated By: Tonia Ghent, M.D.  Ct Cervical Spine Wo Contrast  06/07/2012  *RADIOLOGY REPORT*  Clinical Data:  Status post unwitnessed fall; laceration to the left forehead.  Assess for cervical spine injury.  CT HEAD WITHOUT CONTRAST AND CT CERVICAL SPINE WITHOUT CONTRAST  Technique:  Multidetector CT  imaging of the head and cervical spine was performed following the standard protocol without intravenous contrast.  Multiplanar CT image reconstructions of the cervical spine were also generated.  Comparison: CT of the head performed 05/24/2012, MRI of the brain performed 06/04/2012, and CT of the cervical spine performed 09/06/2007  CT HEAD  Findings: There is no evidence of acute infarction, mass lesion, or intra- or extra-axial hemorrhage on CT.  Prominence of the ventricles and sulci reflects mild cortical volume loss.  Cerebellar atrophy is noted.  Diffuse periventricular and subcortical white matter change likely reflects small vessel ischemic microangiopathy.  The brainstem and fourth ventricle are within normal limits.  The basal ganglia are unremarkable in appearance.  The cerebral hemispheres demonstrate grossly normal gray-white differentiation. No mass effect or midline shift is seen.  There is no evidence of fracture; visualized osseous structures are unremarkable in appearance.  The orbits are within normal limits. The paranasal sinuses and mastoid air cells are well-aerated.  Soft tissue swelling is noted overlying the left frontoparietal calvarium.  IMPRESSION:  1.  No evidence of traumatic intracranial injury or fracture. 2.  Soft tissue swelling overlying the left frontoparietal calvarium. 3.  Mild cortical volume loss and diffuse small vessel ischemic microangiopathy.  CT CERVICAL SPINE  Findings: There is a small minimally displaced fracture involving the anterior aspect of the right transverse process of C2, at the superior articular surface. This does not appear to extend into the right transverse foramen, and associated vascular injury is considered unlikely.  This is new from 2008.  No additional fractures are seen.  Vertebral bodies demonstrate normal height and alignment.  There is mild multilevel disc space narrowing along the lower cervical spine, with associated small anterior and  posterior disc osteophyte complexes.  Facet disease is noted along the cervical spine.  Prevertebral soft tissues are within normal limits.  The thyroid gland is unremarkable in appearance.  The visualized lung apices are clear.  Mild scattered calcification is noted at the carotid bifurcations bilaterally.  IMPRESSION:  1.  Small minimally displaced fracture involving the anterior aspect of the right transverse process of C2, at the superior articular surface.  This does not appear to extend into the right transverse foramen, and associated vascular injury is considered unlikely. 2.  No additional fractures seen. 3.  Mild degenerative change along the lower cervical spine. 4.  Mild scattered calcification at the carotid bifurcations bilaterally.  These results were called by telephone on 06/07/2012  at  09:58 p.m. to  [facility] PA, who verbally acknowledged these results.  Original Report Authenticated By: Tonia Ghent, M.D.   Dg Hand Complete Left  06/07/2012  *RADIOLOGY REPORT*  Clinical Data: Fall, abrasions.  LEFT HAND - COMPLETE 3+ VIEW  Comparison: None.  Findings: Moderate degenerative changes throughout the IP joints, MCP joints and first carpal metacarpal joint. No acute bony abnormality.  Specifically, no fracture, subluxation, or dislocation.  Soft tissues are intact.  IMPRESSION: No acute bony abnormality.  Original Report Authenticated By: Cyndie Chime, M.D.     1. Fall   2. C2 cervical fracture   3. Laceration of forehead   4. UTI (lower urinary tract infection)       MDM  Patient seen and evaluated. Patient no acute distress.    Patient seen and discussed with attending physician. Dr. Fredderick Phenix has spoken with on-call neurosurgery. Recommend patient be placed in c-collar and followup in 2 weeks.      Angus Seller, [place] 06/08/12 [phone]

## 2012-06-07 NOTE — Progress Notes (Signed)
Clinical Child psychotherapist met with pt's private duty sitter at bedside.  Sitter had bed offer list with Ms. Abdalla's preference starred.  Out of the preference list, [facility] the only facility with a bed available.  CSW notified [facility].  Per sitter, Ms. Elbaum is scheduled to arrive to [place] at 12 noon.  CSW to confirm plan with Ms. Dasaro when she becomes available.   Angelia Mould, MSW, [place] [phone]

## 2012-06-07 NOTE — Discharge Instructions (Signed)
   Thank you for allowing [place] to be involved in your healthcare while you were hospitalized at [facility].   Please note that there have been changes to your home medications. Please look at your discharge medication list for details.   Please call your PCP's office if you have any questions or concerns, or any difficulty getting any of your medications.  Please return to the ER if you have worsening of your symptoms or new severe symptoms arise.

## 2012-06-07 NOTE — Progress Notes (Signed)
Clinical Social Worker received message from [employer] indicating she had questions regarding bed offers.  CSW attempted to phone Ms. Blaydes, CSW left voicemail message on Ms. Obi's phone.   Angelia Mould, MSW, [place] [phone]

## 2012-06-07 NOTE — ED Notes (Signed)
Per EMS pt in from [facility], pt had unwitnessed fall at facility. Pt has lac to left of forehead and abrasions to hands. Unknown baseline mental status.

## 2012-06-07 NOTE — ED Notes (Signed)
Patient transported to CT 

## 2012-06-08 LAB — URINE CULTURE

## 2012-06-08 MED ORDER — CIPROFLOXACIN HCL 500 MG PO TABS: 500.0000 mg | ORAL_TABLET | Freq: Two times a day (BID) | ORAL | Status: DC

## 2012-06-08 NOTE — Discharge Instructions (Signed)
You were found to have a fracture of your cervical spine. You were placed in a neck brace to wear for the next 2 weeks. You will need to followup with your primary care provider in neurosurgery specialist at that time for continued evaluation and treatment. You were also treated for your lacerations. You had staples placed and these will need to be removed in 5-7 days.  Please keep your wounds clean and dry.  You were also found to have signs for a urinary tract infection.  You have been given a prescription for antibiotics to use to help treat this infection.  Please take these for the full length of time.     Cervical Spine Fracture, Stable The muscles and ligaments in your neck have been stretched and a bone in your neck appears to be broken. This is known as a cervical spine fracture. This fracture appears to be stable. This means that the chances of it causing you problems while it is healing are very small. Your caregiver feels that no harm will come to you if you are followed up at home. DIAGNOSIS  The diagnosis of your injury had been made with X-rays of your neck. Often CT scans and or MRI is used to confirm the diagnosis and clarify how your injury should be treated. Generally it is the examination of your neck, arms and legs and the history of your injury which prompts the caregiver to order these tests. HOME CARE INSTRUCTIONS  Ice packs to the neck or areas of pain approximately 3 to 4 times a day for 15 to 20 minutes. Do this for 2 days.   If given a cervical collar, wear as instructed. Do not remove any collar unless instructed by a caregiver.   Only take over-the-counter or prescription medicines for pain, discomfort, or fever as directed by your caregiver.   If your caregiver has given you a follow up appointment if is very important to keep that appointment. Not keeping the appointment could result in a chronic or permanent injury, pain, and disability. If there is any problem  keeping the appointment you must call back to this facility for assistance.  Recheck with the hospital or clinic after a radiologist has read your X-rays if this was not done when you were initially evaluated. This will determine if further studies are necessary. It is your responsibility to obtain your X-ray results. Ask your caregiver how you are to be informed about your X-ray results. X-rays may be repeated in one week to three weeks. This is to:  Make sure that hairline fractures not detected on the first X-rays are not overlooked.   Find ligaments that are completely disrupted which have left your neck unstable.  SEEK IMMEDIATE MEDICAL CARE IF:   You have increasing pain in your neck.   You develop difficulties swallowing or breathing.   You have numbness, weakness, burning pain or movement problems in the arms or legs.   You have difficulty walking.   You develop bowel or bladder retention or incontinence.   You have problems with coordination.  MAKE SURE YOU:   Understand these instructions.   Will watch your condition.   Will get help right away if you are not doing well or get worse.  Document Released: 10/21/2004 Document Revised: 11/23/2011 Document Reviewed: 07/17/2008 [facility] Patient Information 2012 [place], [place].       Facial Laceration A facial laceration is a cut on the face. Lacerations usually heal quickly, but they need  special care to reduce scarring. It will take 1 to 2 years for the scar to lose its redness and to heal completely. TREATMENT  Some facial lacerations may not require closure. Some lacerations may not be able to be closed due to an increased risk of infection. It is important to see your caregiver as soon as possible after an injury to minimize the risk of infection and to maximize the opportunity for successful closure. If closure is appropriate, pain medicines may be given, if needed. The wound will be cleaned to help prevent infection.  Your caregiver will use stitches (sutures), staples, wound glue (adhesive), or skin adhesive strips to repair the laceration. These tools bring the skin edges together to allow for faster healing and a better cosmetic outcome. However, all wounds will heal with a scar.  Once the wound has healed, scarring can be minimized by covering the wound with sunscreen during the day for 1 full year. Use a sunscreen with an SPF of at least 30. Sunscreen helps to reduce the pigment that will form in the scar. When applying sunscreen to a completely healed wound, massage the scar for a few minutes to help reduce the appearance of the scar. Use circular motions with your fingertips, on and around the scar. Do not massage a healing wound. HOME CARE INSTRUCTIONS For sutures:  Keep the wound clean and dry.   If you were given a bandage (dressing), you should change it at least once a day. Also change the dressing if it becomes wet or dirty, or as directed by your caregiver.   Wash the wound with soap and water 2 times a day. Rinse the wound off with water to remove all soap. Pat the wound dry with a clean towel.   After cleaning, apply a thin layer of the antibiotic ointment recommended by your caregiver. This will help prevent infection and keep the dressing from sticking.   You may shower as usual after the first 24 hours. Do not soak the wound in water until the sutures are removed.   Only take over-the-counter or prescription medicines for pain, discomfort, or fever as directed by your caregiver.   Get your sutures removed as directed by your caregiver. With facial lacerations, sutures should usually be taken out after 4 to 5 days to avoid stitch marks.   Wait a few days after your sutures are removed before applying makeup.  For skin adhesive strips:  Keep the wound clean and dry.   Do not get the skin adhesive strips wet. You may bathe carefully, using caution to keep the wound dry.   If the wound  gets wet, pat it dry with a clean towel.   Skin adhesive strips will fall off on their own. You may trim the strips as the wound heals. Do not remove skin adhesive strips that are still stuck to the wound. They will fall off in time.  For wound adhesive:  You may briefly wet your wound in the shower or bath. Do not soak or scrub the wound. Do not swim. Avoid periods of heavy perspiration until the skin adhesive has fallen off on its own. After showering or bathing, gently pat the wound dry with a clean towel.   Do not apply liquid medicine, cream medicine, ointment medicine, or makeup to your wound while the skin adhesive is in place. This may loosen the film before your wound is healed.   If a dressing is placed over the wound, be careful  not to apply tape directly over the skin adhesive. This may cause the adhesive to be pulled off before the wound is healed.   Avoid prolonged exposure to sunlight or tanning lamps while the skin adhesive is in place. Exposure to ultraviolet light in the first year will darken the scar.   The skin adhesive will usually remain in place for 5 to 10 days, then naturally fall off the skin. Do not pick at the adhesive film.  You may need a tetanus shot if:  You cannot remember when you had your last tetanus shot.   You have never had a tetanus shot.  If you get a tetanus shot, your arm may swell, get red, and feel warm to the touch. This is common and not a problem. If you need a tetanus shot and you choose not to have one, there is a rare chance of getting tetanus. Sickness from tetanus can be serious. SEEK IMMEDIATE MEDICAL CARE IF:  You develop redness, pain, or swelling around the wound.   There is yellowish-white fluid (pus) coming from the wound.   You develop chills or a fever.  MAKE SURE YOU:  Understand these instructions.   Will watch your condition.   Will get help right away if you are not doing well or get worse.  Document Released:  01/11/2005 Document Revised: 11/23/2011 Document Reviewed: 05/29/2011 [facility] Patient Information 2012 [place], [place].     Laceration Care, Adult A laceration is a cut or lesion that goes through all layers of the skin and into the tissue just beneath the skin. TREATMENT  Some lacerations may not require closure. Some lacerations may not be able to be closed due to an increased risk of infection. It is important to see your caregiver as soon as possible after an injury to minimize the risk of infection and maximize the opportunity for successful closure. If closure is appropriate, pain medicines may be given, if needed. The wound will be cleaned to help prevent infection. Your caregiver will use stitches (sutures), staples, wound glue (adhesive), or skin adhesive strips to repair the laceration. These tools bring the skin edges together to allow for faster healing and a better cosmetic outcome. However, all wounds will heal with a scar. Once the wound has healed, scarring can be minimized by covering the wound with sunscreen during the day for 1 full year. HOME CARE INSTRUCTIONS  For sutures or staples:  Keep the wound clean and dry.   If you were given a bandage (dressing), you should change it at least once a day. Also, change the dressing if it becomes wet or dirty, or as directed by your caregiver.   Wash the wound with soap and water 2 times a day. Rinse the wound off with water to remove all soap. Pat the wound dry with a clean towel.   After cleaning, apply a thin layer of the antibiotic ointment as recommended by your caregiver. This will help prevent infection and keep the dressing from sticking.   You may shower as usual after the first 24 hours. Do not soak the wound in water until the sutures are removed.   Only take over-the-counter or prescription medicines for pain, discomfort, or fever as directed by your caregiver.   Get your sutures or staples removed as directed by your  caregiver.  For skin adhesive strips:  Keep the wound clean and dry.   Do not get the skin adhesive strips wet. You may bathe carefully, using caution  to keep the wound dry.   If the wound gets wet, pat it dry with a clean towel.   Skin adhesive strips will fall off on their own. You may trim the strips as the wound heals. Do not remove skin adhesive strips that are still stuck to the wound. They will fall off in time.  For wound adhesive:  You may briefly wet your wound in the shower or bath. Do not soak or scrub the wound. Do not swim. Avoid periods of heavy perspiration until the skin adhesive has fallen off on its own. After showering or bathing, gently pat the wound dry with a clean towel.   Do not apply liquid medicine, cream medicine, or ointment medicine to your wound while the skin adhesive is in place. This may loosen the film before your wound is healed.   If a dressing is placed over the wound, be careful not to apply tape directly over the skin adhesive. This may cause the adhesive to be pulled off before the wound is healed.   Avoid prolonged exposure to sunlight or tanning lamps while the skin adhesive is in place. Exposure to ultraviolet light in the first year will darken the scar.   The skin adhesive will usually remain in place for 5 to 10 days, then naturally fall off the skin. Do not pick at the adhesive film.  You may need a tetanus shot if:  You cannot remember when you had your last tetanus shot.   You have never had a tetanus shot.  If you get a tetanus shot, your arm may swell, get red, and feel warm to the touch. This is common and not a problem. If you need a tetanus shot and you choose not to have one, there is a rare chance of getting tetanus. Sickness from tetanus can be serious. SEEK MEDICAL CARE IF:   You have redness, swelling, or increasing pain in the wound.   You see a red line that goes away from the wound.   You have yellowish-white fluid (pus)  coming from the wound.   You have a fever.   You notice a bad smell coming from the wound or dressing.   Your wound breaks open before or after sutures have been removed.   You notice something coming out of the wound such as wood or glass.   Your wound is on your hand or foot and you cannot move a finger or toe.  SEEK IMMEDIATE MEDICAL CARE IF:   Your pain is not controlled with prescribed medicine.   You have severe swelling around the wound causing pain and numbness or a change in color in your arm, hand, leg, or foot.   Your wound splits open and starts bleeding.   You have worsening numbness, weakness, or loss of function of any joint around or beyond the wound.   You develop painful lumps near the wound or on the skin anywhere on your body.  MAKE SURE YOU:   Understand these instructions.   Will watch your condition.   Will get help right away if you are not doing well or get worse.  Document Released: 12/04/2005 Document Revised: 11/23/2011 Document Reviewed: 05/30/2011 [facility] Patient Information 2012 [place], [place].      Urinary Tract Infection Infections of the urinary tract can start in several places. A bladder infection (cystitis), a kidney infection (pyelonephritis), and a prostate infection (prostatitis) are different types of urinary tract infections (UTIs). They usually get better if  treated with medicines (antibiotics) that kill germs. Take all the medicine until it is gone. You or your child may feel better in a few days, but TAKE ALL MEDICINE or the infection may not respond and may become more difficult to treat. HOME CARE INSTRUCTIONS   Drink enough water and fluids to keep the urine clear or pale yellow. Cranberry juice is especially recommended, in addition to large amounts of water.   Avoid caffeine, tea, and carbonated beverages. They tend to irritate the bladder.   Alcohol may irritate the prostate.   Only take over-the-counter or  prescription medicines for pain, discomfort, or fever as directed by your caregiver.  To prevent further infections:  Empty the bladder often. Avoid holding urine for long periods of time.   After a bowel movement, women should cleanse from front to back. Use each tissue only once.   Empty the bladder before and after sexual intercourse.  FINDING OUT THE RESULTS OF YOUR TEST Not all test results are available during your visit. If your or your child's test results are not back during the visit, make an appointment with your caregiver to find out the results. Do not assume everything is normal if you have not heard from your caregiver or the medical facility. It is important for you to follow up on all test results. SEEK MEDICAL CARE IF:   There is back pain.   Your baby is older than 3 months with a rectal temperature of 100.5 F (38.1 C) or higher for more than 1 day.   Your or your child's problems (symptoms) are no better in 3 days. Return sooner if you or your child is getting worse.  SEEK IMMEDIATE MEDICAL CARE IF:   There is severe back pain or lower abdominal pain.   You or your child develops chills.   You have a fever.   Your baby is older than 3 months with a rectal temperature of 102 F (38.9 C) or higher.   Your baby is 54 months old or younger with a rectal temperature of 100.4 F (38 C) or higher.   There is nausea or vomiting.   There is continued burning or discomfort with urination.  MAKE SURE YOU:   Understand these instructions.   Will watch your condition.   Will get help right away if you are not doing well or get worse.  Document Released: 09/13/2005 Document Revised: 11/23/2011 Document Reviewed: 04/18/2007 [facility] Patient Information 2012 [place], [place].

## 2012-06-08 NOTE — ED Provider Notes (Signed)
Medical screening examination/treatment/procedure(s) were conducted as a shared visit with non-physician practitioner(s) and myself.  I personally evaluated the patient during the encounter   Caroll Cunnington, MD 06/08/12 1440 

## 2012-06-12 ENCOUNTER — Inpatient Hospital Stay (HOSPITAL_COMMUNITY)
Admission: EM | Admit: 2012-06-12 | Discharge: 2012-06-19 | DRG: 871 | Disposition: A | Discharge status: Skilled Nursing Facility | Payer: Medicare Other | Attending: Internal Medicine | Admitting: Internal Medicine

## 2012-06-12 ENCOUNTER — Emergency Department (HOSPITAL_COMMUNITY): Payer: Medicare Other

## 2012-06-12 ENCOUNTER — Encounter (HOSPITAL_COMMUNITY): Payer: Self-pay | Admitting: Radiology

## 2012-06-12 DIAGNOSIS — G9341 Metabolic encephalopathy: Secondary | ICD-10-CM | POA: Diagnosis present

## 2012-06-12 DIAGNOSIS — R7881 Bacteremia: Secondary | ICD-10-CM

## 2012-06-12 DIAGNOSIS — N390 Urinary tract infection, site not specified: Secondary | ICD-10-CM

## 2012-06-12 DIAGNOSIS — R6510 Systemic inflammatory response syndrome (SIRS) of non-infectious origin without acute organ dysfunction: Secondary | ICD-10-CM

## 2012-06-12 DIAGNOSIS — E860 Dehydration: Secondary | ICD-10-CM

## 2012-06-12 DIAGNOSIS — N319 Neuromuscular dysfunction of bladder, unspecified: Secondary | ICD-10-CM | POA: Diagnosis present

## 2012-06-12 DIAGNOSIS — N179 Acute kidney failure, unspecified: Secondary | ICD-10-CM | POA: Diagnosis present

## 2012-06-12 DIAGNOSIS — N189 Chronic kidney disease, unspecified: Secondary | ICD-10-CM | POA: Diagnosis present

## 2012-06-12 DIAGNOSIS — G20 Parkinson's disease: Secondary | ICD-10-CM | POA: Diagnosis present

## 2012-06-12 DIAGNOSIS — E119 Type 2 diabetes mellitus without complications: Secondary | ICD-10-CM | POA: Diagnosis present

## 2012-06-12 DIAGNOSIS — F319 Bipolar disorder, unspecified: Secondary | ICD-10-CM

## 2012-06-12 DIAGNOSIS — G20A1 Parkinson's disease without dyskinesia, without mention of fluctuations: Secondary | ICD-10-CM | POA: Diagnosis present

## 2012-06-12 DIAGNOSIS — J189 Pneumonia, unspecified organism: Secondary | ICD-10-CM

## 2012-06-12 DIAGNOSIS — Z974 Presence of external hearing-aid: Secondary | ICD-10-CM

## 2012-06-12 DIAGNOSIS — A419 Sepsis, unspecified organism: Secondary | ICD-10-CM | POA: Diagnosis present

## 2012-06-12 DIAGNOSIS — A4902 Methicillin resistant Staphylococcus aureus infection, unspecified site: Secondary | ICD-10-CM | POA: Diagnosis present

## 2012-06-12 DIAGNOSIS — G9340 Encephalopathy, unspecified: Secondary | ICD-10-CM | POA: Diagnosis present

## 2012-06-12 DIAGNOSIS — Z4789 Encounter for other orthopedic aftercare: Secondary | ICD-10-CM

## 2012-06-12 DIAGNOSIS — Z8659 Personal history of other mental and behavioral disorders: Secondary | ICD-10-CM

## 2012-06-12 DIAGNOSIS — R4182 Altered mental status, unspecified: Secondary | ICD-10-CM

## 2012-06-12 HISTORY — DX: Unspecified displaced fracture of second cervical vertebra, initial encounter for closed fracture: S12.100A

## 2012-06-12 HISTORY — DX: Presence of urogenital implants: Z96.0

## 2012-06-12 HISTORY — DX: Methicillin resistant Staphylococcus aureus infection, unspecified site: A49.02

## 2012-06-12 HISTORY — DX: Presence of other specified devices: Z97.8

## 2012-06-12 LAB — COMPREHENSIVE METABOLIC PANEL
ALT: <5 U/L (ref 0–53)
BUN: 49 mg/dL — ABNORMAL HIGH (ref 6–23)
CO2: 21 mEq/L (ref 19–32)
Calcium: 9.2 mg/dL (ref 8.4–10.5)
Creatinine, Ser: 1.79 mg/dL — ABNORMAL HIGH (ref 0.50–1.35)
GFR calc Af Amer: 41 mL/min — ABNORMAL LOW (ref >90)
GFR calc non Af Amer: 35 mL/min — ABNORMAL LOW (ref >90)
Glucose, Bld: 99 mg/dL (ref 70–99)
Sodium: 136 mEq/L (ref 135–145)
Total Protein: 5.3 g/dL — ABNORMAL LOW (ref 6.0–8.3)

## 2012-06-12 LAB — CBC WITH DIFFERENTIAL/PLATELET
Basophils Absolute: 0 10*3/uL (ref 0.0–0.1)
Basophils Relative: 0 % (ref 0–1)
MCHC: 33.4 g/dL (ref 30.0–36.0)
Neutro Abs: 14.7 10*3/uL — ABNORMAL HIGH (ref 1.7–7.7)
Neutrophils Relative %: 91 % — ABNORMAL HIGH (ref 43–77)
RDW: 13.8 % (ref 11.5–15.5)
WBC: 16.1 10*3/uL — ABNORMAL HIGH (ref 4.0–10.5)

## 2012-06-12 LAB — URINALYSIS, ROUTINE W REFLEX MICROSCOPIC
Nitrite: NEGATIVE
Specific Gravity, Urine: 1.024 (ref 1.005–1.030)
Urobilinogen, UA: 0.2 mg/dL (ref 0.0–1.0)
pH: 5.5 (ref 5.0–8.0)

## 2012-06-12 LAB — URINE MICROSCOPIC-ADD ON

## 2012-06-12 LAB — LACTIC ACID, PLASMA: Lactic Acid, Venous: 1.2 mmol/L (ref 0.5–2.2)

## 2012-06-12 MED ORDER — DEXTROSE 5 % IV SOLN
2.0000 g | Freq: Two times a day (BID) | INTRAVENOUS | Status: DC
  Administered 2012-06-12: 2 g via INTRAVENOUS
  Filled 2012-06-12 (×3): qty 2

## 2012-06-12 MED ORDER — DEXTROSE 5 % IV SOLN
1.0000 g | Freq: Once | INTRAVENOUS | Status: AC
  Administered 2012-06-12: 1 g via INTRAVENOUS
  Filled 2012-06-12: qty 10

## 2012-06-12 MED ORDER — SERTRALINE HCL 100 MG PO TABS
100.0000 mg | ORAL_TABLET | Freq: Every day | ORAL | Status: DC
  Administered 2012-06-13 – 2012-06-19 (×7): 100 mg via ORAL
  Filled 2012-06-12 (×7): qty 1

## 2012-06-12 MED ORDER — SODIUM CHLORIDE 0.9 % IV BOLUS (SEPSIS)
1000.0000 mL | Freq: Once | INTRAVENOUS | Status: AC
  Administered 2012-06-12: 1000 mL via INTRAVENOUS

## 2012-06-12 MED ORDER — ENOXAPARIN SODIUM 40 MG/0.4ML ~~LOC~~ SOLN
40.0000 mg | SUBCUTANEOUS | Status: DC
  Administered 2012-06-13 – 2012-06-15 (×3): 40 mg via SUBCUTANEOUS
  Filled 2012-06-12 (×3): qty 0.4

## 2012-06-12 MED ORDER — SODIUM CHLORIDE 0.9 % IJ SOLN
3.0000 mL | Freq: Two times a day (BID) | INTRAMUSCULAR | Status: DC
  Administered 2012-06-13 (×3): 3 mL via INTRAVENOUS

## 2012-06-12 MED ORDER — PANTOPRAZOLE SODIUM 40 MG PO TBEC
40.0000 mg | DELAYED_RELEASE_TABLET | Freq: Every day | ORAL | Status: DC
  Administered 2012-06-13 – 2012-06-18 (×7): 40 mg via ORAL
  Filled 2012-06-12 (×7): qty 1

## 2012-06-12 MED ORDER — LEVOFLOXACIN IN D5W 750 MG/150ML IV SOLN
750.0000 mg | INTRAVENOUS | Status: DC
  Administered 2012-06-13: 750 mg via INTRAVENOUS
  Filled 2012-06-12 (×3): qty 150

## 2012-06-12 MED ORDER — ACETAMINOPHEN 325 MG PO TABS: 650.0000 mg | ORAL_TABLET | Freq: Four times a day (QID) | ORAL | Status: DC | PRN

## 2012-06-12 MED ORDER — CARBIDOPA-LEVODOPA 25-250 MG PO TABS
1.0000 | ORAL_TABLET | Freq: Three times a day (TID) | ORAL | Status: DC
  Administered 2012-06-13 – 2012-06-19 (×19): 1 via ORAL
  Filled 2012-06-12 (×24): qty 1

## 2012-06-12 MED ORDER — LAMOTRIGINE 150 MG PO TABS
150.0000 mg | ORAL_TABLET | Freq: Two times a day (BID) | ORAL | Status: DC
  Administered 2012-06-13 – 2012-06-19 (×13): 150 mg via ORAL
  Filled 2012-06-12 (×17): qty 1

## 2012-06-12 MED ORDER — DEXTROSE 5 % IV SOLN
500.0000 mg | Freq: Once | INTRAVENOUS | Status: AC
  Administered 2012-06-12: 500 mg via INTRAVENOUS
  Filled 2012-06-12: qty 500

## 2012-06-12 MED ORDER — ONDANSETRON HCL 4 MG/2ML IJ SOLN: 4.0000 mg | Freq: Four times a day (QID) | INTRAMUSCULAR | Status: DC | PRN

## 2012-06-12 MED ORDER — LORAZEPAM 0.5 MG PO TABS
0.5000 mg | ORAL_TABLET | Freq: Every evening | ORAL | Status: DC | PRN
  Administered 2012-06-13 (×2): 0.5 mg via ORAL
  Filled 2012-06-12 (×2): qty 1

## 2012-06-12 MED ORDER — SODIUM CHLORIDE 0.9 % IV SOLN: INTRAVENOUS | Status: AC

## 2012-06-12 MED ORDER — ACETAMINOPHEN 650 MG RE SUPP: 650.0000 mg | Freq: Four times a day (QID) | RECTAL | Status: DC | PRN

## 2012-06-12 MED ORDER — ENTACAPONE 200 MG PO TABS
200.0000 mg | ORAL_TABLET | Freq: Three times a day (TID) | ORAL | Status: DC
  Administered 2012-06-13 – 2012-06-19 (×19): 200 mg via ORAL
  Filled 2012-06-12 (×25): qty 1

## 2012-06-12 MED ORDER — INSULIN ASPART 100 UNIT/ML ~~LOC~~ SOLN: 0.0000 [IU] | Freq: Three times a day (TID) | SUBCUTANEOUS | Status: DC

## 2012-06-12 MED ORDER — SODIUM CHLORIDE 0.9 % IV SOLN
INTRAVENOUS | Status: DC
  Administered 2012-06-13: 01:00:00 via INTRAVENOUS

## 2012-06-12 MED ORDER — LITHIUM CARBONATE 300 MG PO CAPS
300.0000 mg | ORAL_CAPSULE | Freq: Two times a day (BID) | ORAL | Status: DC
Start: 2012-06-13 — End: 2012-06-13
  Filled 2012-06-12 (×3): qty 1

## 2012-06-12 MED ORDER — VANCOMYCIN HCL IN DEXTROSE 1-5 GM/200ML-% IV SOLN
1000.0000 mg | INTRAVENOUS | Status: DC
  Administered 2012-06-13 – 2012-06-15 (×4): 1000 mg via INTRAVENOUS
  Filled 2012-06-12 (×4): qty 200

## 2012-06-12 MED ORDER — ONDANSETRON HCL 4 MG PO TABS: 4.0000 mg | ORAL_TABLET | Freq: Four times a day (QID) | ORAL | Status: DC | PRN

## 2012-06-12 NOTE — ED Notes (Signed)
Received pt from [employer] Pt has Sports coach at bedside. Pt placed on monitor.

## 2012-06-12 NOTE — Progress Notes (Signed)
ANTIBIOTIC CONSULT NOTE - INITIAL  Pharmacy Consult for Vancomycin/Cefepime/Levaquin Indication: pneumonia  Allergies  Allergen Reactions  . Cholestatin Other (See Comments)    Per [facility]    Patient Measurements: Height: 5' 10.08" (178 cm) Weight: 138 lb 10.7 oz (62.9 kg) IBW/kg (Calculated) : 73.18   Vital Signs: Temp: 97.8 F (36.6 C) (06/26 1826) Temp src: Oral (06/26 1826) BP: 109/54 mmHg (06/26 2030) Pulse Rate: 57  (06/26 2030) Intake/Output from previous day:   Intake/Output from this shift:    Labs:  [facility] 06/12/12 1830  WBC 16.1*  HGB 12.5*  PLT 299  LABCREA --  CREATININE 1.79*   Estimated Creatinine Clearance: 31.7 ml/min (by C-G formula based on Cr of 1.79). No results found for this basename: VANCOTROUGH:2,VANCOPEAK:2,VANCORANDOM:2,GENTTROUGH:2,GENTPEAK:2,GENTRANDOM:2,TOBRATROUGH:2,TOBRAPEAK:2,TOBRARND:2,AMIKACINPEAK:2,AMIKACINTROU:2,AMIKACIN:2, in the last 72 hours   Microbiology: Recent Results (from the past 720 hour(s))  MRSA PCR SCREENING     Status: Normal   Collection Time   06/04/12 11:55 AM      Component Value Range Status Comment   MRSA by PCR NEGATIVE  NEGATIVE Final   URINE CULTURE     Status: Normal   Collection Time   06/05/12  8:11 AM      Component Value Range Status Comment   Specimen Description URINE, RANDOM   Final    Special Requests CX ADDED 06/05/12 1230   Final    Culture  Setup Time [id number]   Final    Colony Count >=100,000 COLONIES/ML   Final    Culture     Final    Value: STAPHYLOCOCCUS AUREUS     Note: RIFAMPIN AND GENTAMICIN SHOULD NOT BE USED AS SINGLE DRUGS FOR TREATMENT OF STAPH INFECTIONS.   Report Status 06/08/2012 FINAL   Final    Organism ID, Bacteria STAPHYLOCOCCUS AUREUS   Final   URINE CULTURE     Status: Normal   Collection Time   06/07/12 10:25 PM      Component Value Range Status Comment   Specimen Description URINE, CATHETERIZED   Final    Special Requests NONE   Final    Culture  Setup Time  [id number]   Final    Colony Count 95,000 COLONIES/ML   Final    Culture     Final    Value: Multiple bacterial morphotypes present, none predominant. Suggest appropriate recollection if clinically indicated.   Report Status 06/08/2012 FINAL   Final     Medical History: Past Medical History  Diagnosis Date  . Hearing aid worn   . Hypertension   . Parkinson disease   . Pneumonia   . Diabetes mellitus type II   . Arthritis   . Bipolar disorder   . Anxiety   . Depression     Medications:   (Not in a hospital admission) Assessment: 77 y/o male patient admitted with altered mental status requiring broad spectrum antibiotics for r/o pneumonia. Patient recently hospitalized for C2 fracture. Noted elevated scr, will renally adjust antibiotics.  Goal of Therapy:  Vancomycin trough level 15-20 mcg/ml  Plan:  Vancomycin 1g IV q24h, cefepime 2g IV q12h and levaquin 750mg  IV q48h. Follow up c&s and monitor renal function. Measure antibiotic drug levels at steady [address], PharmD, [place] Pager [phone] 06/12/2012,9:46 PM

## 2012-06-12 NOTE — ED Notes (Signed)
Patient transported to CT 

## 2012-06-12 NOTE — ED Provider Notes (Addendum)
History     CSN: [id number]  Arrival date & time 06/12/12  7829   First MD Initiated Contact with Patient 06/12/12 1818      Chief Complaint  Patient presents with  . Altered Mental Status    (Consider location/radiation/quality/duration/timing/severity/associated sxs/prior treatment) HPI Comments: Treated 5 days ago after a fall. He required staples to the left side of his head and placement in a c-collar first C2 transverse process fracture. At that time he was also found to have a UTI and was started on Bactrim.  Patient is a 77 y.o. male presenting with altered mental status. The history is provided by the nursing home and a caregiver.  Altered Mental Status This is a new problem. The current episode started 12 to 24 hours ago. The problem occurs constantly. Progression since onset: waxing and waning. Associated symptoms comments: Cough, fever and sleeping all the time for the last 24hours. Nothing aggravates the symptoms. Nothing relieves the symptoms. He has tried nothing for the symptoms. The treatment provided no relief.    Past Medical History  Diagnosis Date  . Hearing aid worn   . Hypertension   . Parkinson disease   . Pneumonia   . Diabetes mellitus type II   . Arthritis   . Bipolar disorder   . Anxiety   . Depression     Past Surgical History  Procedure Date  . Total hip arthroplasty     Family History  Problem Relation Age of Onset  . Depression Father     History  Substance Use Topics  . Smoking status: Never Smoker   . Smokeless tobacco: Never Used  . Alcohol Use: Yes     occasional      Review of Systems  Unable to perform ROS Psychiatric/Behavioral: Positive for altered mental status.    Allergies  Cholestatin  Home Medications   Current Outpatient Rx  Name Route Sig Dispense Refill  . AMLODIPINE BESYLATE 5 MG PO TABS Oral Take 5 mg by mouth daily.      . ASPIRIN 81 MG PO TABS Oral Take 81 mg by mouth daily.      Marland Kitchen  CARBIDOPA-LEVODOPA 25-250 MG PO TABS Oral Take 1 tablet by mouth 3 (three) times daily.      Marland Kitchen CIPROFLOXACIN HCL 500 MG PO TABS Oral Take 1 tablet (500 mg total) by mouth 2 (two) times daily. One po bid x 7 days 14 tablet 0  . ENTACAPONE 200 MG PO TABS Oral Take 200 mg by mouth 3 (three) times daily.     Marland Kitchen HYDROCHLOROTHIAZIDE 25 MG PO TABS Oral Take 25 mg by mouth daily.      Marland Kitchen LAMOTRIGINE 150 MG PO TABS Oral Take 150 mg by mouth 2 (two) times daily.    Marland Kitchen LITHIUM CARBONATE 300 MG PO CAPS Oral Take 300 mg by mouth 2 (two) times daily with a meal.    . LORAZEPAM 0.5 MG PO TABS Oral Take 0.5 mg by mouth at bedtime.    Marland Kitchen METFORMIN HCL 500 MG PO TABS Oral Take 500 mg by mouth 2 (two) times daily with a meal.    . THERA M PLUS PO TABS Oral Take 1 tablet by mouth daily.      Marland Kitchen OMEPRAZOLE 20 MG PO CPDR Oral Take 20 mg by mouth daily.     Marland Kitchen POTASSIUM CHLORIDE CRYS ER 20 MEQ PO TBCR Oral Take 20 mEq by mouth daily.    Marland Kitchen RAMIPRIL 5 MG PO  TABS Oral Take 5 mg by mouth 2 (two) times daily.      . RED YEAST RICE PO Oral Take 2 tablets by mouth at bedtime.      . SERTRALINE HCL 100 MG PO TABS Oral Take 100 mg by mouth daily.    . SULFAMETHOXAZOLE-TMP DS 800-160 MG PO TABS Oral Take 1 tablet by mouth 2 (two) times daily. Until 06/14/12    . TEMAZEPAM 30 MG PO CAPS Oral Take 1 capsule (30 mg total) by mouth at bedtime as needed for sleep. 30 capsule 1    BP 95/50  Pulse 53  Temp 97.8 F (36.6 C) (Oral)  Resp 18  SpO2 98%  Physical Exam  Nursing note and vitals reviewed. Constitutional: He appears well-developed and well-nourished. No distress.       Opens his eyes to voice but falls back to sleep quickly when not stimulated  HENT:  Head: Normocephalic.    Mouth/Throat: Oropharynx is clear and moist. Mucous membranes are dry.       Staples in tact with healing contusion over the left temporal area  Eyes: Conjunctivae and EOM are normal. Pupils are equal, round, and reactive to light.  Neck: Neck  supple.       C-collar in place  Cardiovascular: Normal rate, regular rhythm and intact distal pulses.   No murmur heard. Pulmonary/Chest: Effort normal. No respiratory distress. He has no wheezes. He has rhonchi in the left middle field and the left lower field. He has no rales.  Abdominal: Soft. He exhibits no distension. There is no tenderness. There is no rebound and no guarding.  Genitourinary:       Indwelling Foley catheter with yellow urine in the bag  Musculoskeletal: Normal range of motion. He exhibits no edema and no tenderness.  Neurological: He has normal strength. He is disoriented. No sensory deficit. GCS eye subscore is 2. GCS verbal subscore is 2. GCS motor subscore is 1.  Skin: Skin is warm and dry. No rash noted. No erythema.  Psychiatric: He has a normal mood and affect. His behavior is normal.    ED Course  Procedures (including critical care time)  Labs Reviewed  CBC WITH DIFFERENTIAL - Abnormal; Notable for the following:    WBC 16.1 (*)     RBC 4.07 (*)     Hemoglobin 12.5 (*)     HCT 37.4 (*)     Neutrophils Relative 91 (*)     Neutro Abs 14.7 (*)     Lymphocytes Relative 4 (*)     Lymphs Abs 0.6 (*)     All other components within normal limits  COMPREHENSIVE METABOLIC PANEL - Abnormal; Notable for the following:    BUN 49 (*)     Creatinine, Ser 1.79 (*)     Total Protein 5.3 (*)     Albumin 3.3 (*)     GFR calc non Af Amer 35 (*)     GFR calc Af Amer 41 (*)     All other components within normal limits  URINALYSIS, ROUTINE W REFLEX MICROSCOPIC - Abnormal; Notable for the following:    Color, Urine ORANGE (*)  BIOCHEMICALS MAY BE AFFECTED BY COLOR   APPearance HAZY (*)     Bilirubin Urine SMALL (*)     Ketones, ur 15 (*)     Protein, ur 100 (*)     Leukocytes, UA MODERATE (*)     All other components within normal limits  URINE MICROSCOPIC-ADD  ON - Abnormal; Notable for the following:    Bacteria, UA FEW (*)     Casts HYALINE CASTS (*)     All  other components within normal limits  LACTIC ACID, PLASMA  URINE CULTURE  PROCALCITONIN  CULTURE, BLOOD (ROUTINE X 2)  CULTURE, BLOOD (ROUTINE X 2)   Dg Chest 1 View  06/12/2012  *RADIOLOGY REPORT*  Clinical Data: 77 year old male Cough, altered mental status.  CHEST - 1 VIEW  Comparison: 06/04/2012 and earlier.  Findings: Stable cardiomegaly and mediastinal contours.  No pneumothorax or pulmonary edema.  No pleural effusion.  Increased streaky opacity at the lung bases.  Multiple chronic left rib fractures.  IMPRESSION: Lower lung volumes with increased basilar atelectasis.  Original Report Authenticated By: Harley Hallmark, M.D.   Ct Head Wo Contrast  06/12/2012  *RADIOLOGY REPORT*  Clinical Data: 77 year old male with altered mental status. Hypertension.  Recent fall.  CT HEAD WITHOUT CONTRAST  Technique:  Contiguous axial images were obtained from the base of the skull through the vertex without contrast.  Comparison: 06/07/2012 and earlier.  Findings: Left scalp skin staples in place.  No acute scalp or orbit soft tissue abnormality.  The left tympanic cavity and mastoids remain opacified.  Trace fluid level in the left sphenoid sinus. No acute osseous abnormality identified.  Calcified atherosclerosis at the skull base.  Mega cisterna magna variant again noted. Stable ventricle size and configuration. Dural calcifications. No acute intracranial hemorrhage identified. No evidence of cortically based acute infarction identified.  No suspicious intracranial vascular hyperdensity.  IMPRESSION: Stable brain. No acute intracranial abnormality.  Original Report Authenticated By: Harley Hallmark, M.D.     Date: 06/12/2012  Rate: 55  Rhythm: sinus bradycardia  QRS Axis: normal  Intervals: normal  ST/T Wave abnormalities: nonspecific T wave changes t-wave inversion inferior  Conduction Disutrbances:none  Narrative Interpretation:   Old EKG Reviewed: unchanged   1. CAP (community acquired  pneumonia)   2. Dehydration       MDM   Patient with a fall resulting in the C2 transverse process fracture 5 days ago. Per the patient's caregiver today he became extremely somnolent and not eating or drinking. He was altered with a low blood pressure at the nursing home of 75/30 despite holding blood pressure medications for the last 2 days and they decided to bring him here. Patient has been on Bactrim for a UTI for the last 5 days the caregiver states he's had a cough and fever for the last 24 hours. On exam patient has rhonchi in the left lower lobe and after 250 cc Maurine Minister a blood pressure of 95/50 and heart rate of 53. Concern for possible infection and sepsis given the hypotension. CBC, CMP, UA, urine culture, lactic acid, pro-calcitonin, blood cultures, chest x-ray, EKG pending  8:10 PM Head CT was unchanged. Chest x-ray with increased basilar atelectasis but given his white blood cell count of 16,000 with severe dehydration. This is most likely a pneumonia and will treat with Rocephin and azithromycin as he has community acquired.  Urine appears to be improving.      Gwyneth Sprout, MD 06/12/12 2011  Gwyneth Sprout, MD 06/12/12 2016

## 2012-06-12 NOTE — ED Notes (Signed)
Pt presents with altered mental status since 0800 today. Pt is currently being treated for a uti and is currently taking an antibiotixc. Pt was hypotensive upon EMS arrival . Pt was treated here for a fall injuring head on Friday.

## 2012-06-12 NOTE — H&P (Addendum)
[name] is an 77 y.o. male.   PCP - Dr.Roy Ludwig Clarks. Chief Complaint: Increasing confusion. HPI: 77 year-old male with history of hypertension, Parkinson disease, diabetes mellitus type 2, chronic indwelling Foley's catheter was brought from the nursing home because of patient was found to be increasingly confused. Patient also was having cough. In the ER patient was initially found to be hypotensive and a 1 L bolus was given. After which his blood pressure improved. He has leukocytosis but at this time he is afebrile and chest x-ray shows atelectasis. Patient will be admitted for further management. Patient is oriented to his name only. He does have slurred speech which has per nursing home is chronic. And he has indwelling Foley's catheter and the reason not known. Of note patient was brought to the ER 3-4 days ago with a fall and had sustained left scalp laceration which was sutured and CT head showed C2 fracture. Patient was at that time discharged on cervical collar to follow with neurosurgeon in 2 weeks. Week and half ago patient also was admitted to the hospital at that time patient had lumbar puncture done which was unremarkable and also was found to have urinary tract infection and grew staph aureus. Patient was at that time sent home on ceftriaxone. Presently patient denies any chest pain and is not short of breath. Has not had any nausea or vomiting or diarrhea.  Past Medical History  Diagnosis Date  . Hearing aid worn   . Hypertension   . Parkinson disease   . Pneumonia   . Diabetes mellitus type II   . Arthritis   . Bipolar disorder   . Anxiety   . Depression     Past Surgical History  Procedure Date  . Total hip arthroplasty     Family History  Problem Relation Age of Onset  . Depression Father    Social History:  reports that he has never smoked. He has never used smokeless tobacco. He reports that he drinks alcohol. He reports that he does not use illicit  drugs.  Allergies:  Allergies  Allergen Reactions  . Cholestatin Other (See Comments)    Per MAR     (Not in a hospital admission)  Results for orders placed during the hospital encounter of 06/12/12 (from the past 48 hour(s))  CBC WITH DIFFERENTIAL     Status: Abnormal   Collection Time   06/12/12  6:30 PM      Component Value Range Comment   WBC 16.1 (*) 4.0 - 10.5 K/uL    RBC 4.07 (*) 4.22 - 5.81 MIL/uL    Hemoglobin 12.5 (*) 13.0 - 17.0 g/dL    HCT 16.1 (*) 09.6 - 52.0 %    MCV 91.9  78.0 - 100.0 fL    MCH 30.7  26.0 - 34.0 pg    MCHC 33.4  30.0 - 36.0 g/dL    RDW 04.5  40.9 - 81.1 %    Platelets 299  150 - 400 K/uL    Neutrophils Relative 91 (*) 43 - 77 %    Neutro Abs 14.7 (*) 1.7 - 7.7 K/uL    Lymphocytes Relative 4 (*) 12 - 46 %    Lymphs Abs 0.6 (*) 0.7 - 4.0 K/uL    Monocytes Relative 4  3 - 12 %    Monocytes Absolute 0.6  0.1 - 1.0 K/uL    Eosinophils Relative 1  0 - 5 %    Eosinophils Absolute 0.1  0.0 - 0.7 K/uL    Basophils Relative 0  0 - 1 %    Basophils Absolute 0.0  0.0 - 0.1 K/uL   COMPREHENSIVE METABOLIC PANEL     Status: Abnormal   Collection Time   06/12/12  6:30 PM      Component Value Range Comment   Sodium 136  135 - 145 mEq/L    Potassium 4.4  3.5 - 5.1 mEq/L    Chloride 105  96 - 112 mEq/L    CO2 21  19 - 32 mEq/L    Glucose, Bld 99  70 - 99 mg/dL    BUN 49 (*) 6 - 23 mg/dL    Creatinine, Ser 0.98 (*) 0.50 - 1.35 mg/dL    Calcium 9.2  8.4 - 11.9 mg/dL    Total Protein 5.3 (*) 6.0 - 8.3 g/dL    Albumin 3.3 (*) 3.5 - 5.2 g/dL    AST 10  0 - 37 U/L    ALT <5  0 - 53 U/L REPEATED TO VERIFY   Alkaline Phosphatase 77  39 - 117 U/L    Total Bilirubin 0.3  0.3 - 1.2 mg/dL    GFR calc non Af Amer 35 (*) >90 mL/min    GFR calc Af Amer 41 (*) >90 mL/min   LACTIC ACID, PLASMA     Status: Normal   Collection Time   06/12/12  6:30 PM      Component Value Range Comment   Lactic Acid, Venous 1.2  0.5 - 2.2 mmol/L   PROCALCITONIN     Status: Normal    Collection Time   06/12/12  6:30 PM      Component Value Range Comment   Procalcitonin 0.10     URINALYSIS, ROUTINE W REFLEX MICROSCOPIC     Status: Abnormal   Collection Time   06/12/12  6:31 PM      Component Value Range Comment   Color, Urine ORANGE (*) YELLOW BIOCHEMICALS MAY BE AFFECTED BY COLOR   APPearance HAZY (*) CLEAR    Specific Gravity, Urine 1.024  1.005 - 1.030    pH 5.5  5.0 - 8.0    Glucose, UA NEGATIVE  NEGATIVE mg/dL    Hgb urine dipstick NEGATIVE  NEGATIVE    Bilirubin Urine SMALL (*) NEGATIVE    Ketones, ur 15 (*) NEGATIVE mg/dL    Protein, ur 147 (*) NEGATIVE mg/dL    Urobilinogen, UA 0.2  0.0 - 1.0 mg/dL    Nitrite NEGATIVE  NEGATIVE    Leukocytes, UA MODERATE (*) NEGATIVE   URINE MICROSCOPIC-ADD ON     Status: Abnormal   Collection Time   06/12/12  6:31 PM      Component Value Range Comment   Squamous Epithelial / LPF RARE  RARE    WBC, UA 7-10  <3 WBC/hpf    RBC / HPF 0-2  <3 RBC/hpf    Bacteria, UA FEW (*) RARE    Casts HYALINE CASTS (*) NEGATIVE    Urine-Other MUCOUS PRESENT      Dg Chest 1 View  06/12/2012  *RADIOLOGY REPORT*  Clinical Data: 77 year old male Cough, altered mental status.  CHEST - 1 VIEW  Comparison: 06/04/2012 and earlier.  Findings: Stable cardiomegaly and mediastinal contours.  No pneumothorax or pulmonary edema.  No pleural effusion.  Increased streaky opacity at the lung bases.  Multiple chronic left rib fractures.  IMPRESSION: Lower lung volumes with increased basilar atelectasis.  Original Report Authenticated By:  H.LEE HALL III, M.D.   Ct Head Wo Contrast  06/12/2012  *RADIOLOGY REPORT*  Clinical Data: 77 year old male with altered mental status. Hypertension.  Recent fall.  CT HEAD WITHOUT CONTRAST  Technique:  Contiguous axial images were obtained from the base of the skull through the vertex without contrast.  Comparison: 06/07/2012 and earlier.  Findings: Left scalp skin staples in place.  No acute scalp or orbit soft tissue  abnormality.  The left tympanic cavity and mastoids remain opacified.  Trace fluid level in the left sphenoid sinus. No acute osseous abnormality identified.  Calcified atherosclerosis at the skull base.  Mega cisterna magna variant again noted. Stable ventricle size and configuration. Dural calcifications. No acute intracranial hemorrhage identified. No evidence of cortically based acute infarction identified.  No suspicious intracranial vascular hyperdensity.  IMPRESSION: Stable brain. No acute intracranial abnormality.  Original Report Authenticated By: Harley Hallmark, M.D.    Review of Systems  Constitutional: Negative.   HENT: Negative.   Eyes: Negative.   Respiratory: Positive for cough.   Cardiovascular: Negative.   Gastrointestinal: Negative.   Genitourinary: Negative.   Musculoskeletal: Negative.   Skin: Negative.   Neurological:       Increasing confusion.  Endo/Heme/Allergies: Negative.   Psychiatric/Behavioral: Negative.     Blood pressure 87/49, pulse 54, temperature 97.8 F (36.6 C), temperature source Oral, resp. rate 16, height 5' 10.08" (1.78 m), weight 62.9 kg (138 lb 10.7 oz), SpO2 99.00%. Physical Exam  Constitutional: He appears well-developed and well-nourished. No distress.  HENT:  Head: Normocephalic.  Right Ear: External ear normal.  Left Ear: External ear normal.  Nose: Nose normal.  Mouth/Throat: Oropharynx is clear and moist. No oropharyngeal exudate.       Cervical collar. and staples on the left side of scalp.  Eyes: Conjunctivae are normal. Pupils are equal, round, and reactive to light. Right eye exhibits no discharge. Left eye exhibits no discharge. No scleral icterus.  Neck: Normal range of motion. Neck supple.  Cardiovascular: Normal rate and regular rhythm.   Respiratory: Effort normal and breath sounds normal. No respiratory distress. He has no wheezes. He has no rales.  GI: Soft. Bowel sounds are normal.  Musculoskeletal:       Left shoulder  pain.  Neurological: He is alert.       Oriented to his name only. Follows commands. Moves all extremities.  Skin: Skin is warm and dry. He is not diaphoretic.     Assessment/Plan #1. SIRS most likely source could be pneumonia - patient was initially found to be hypotensive which has responded to fluids. Overnight we will observe patient in step down with continued IV hydration. Blood cultures and urine cultures has been ordered. Patient will be treated on broad spectrum antibiotics to cover for health care associated pneumonia. It is very important to follow the blood cultures as patient's recent urine culture showed staph aureus. #2. Encephalopathy most likely metabolic secondary to infection - observe. I think his mental status will improve with hydration and antibiotics. As mentioned earlier patient has had a recent lumbar puncture which was unremarkable. #3. Acute renal failure - probably from dehydration and hypotension. Closely follow intake output and metabolic panel. I think his metabolic panel will improve with hydration. #4. History of hypertension presently hypotensive - holding off all anti-hypertensives. #5. Diabetes mellitus type 2 - sliding scale coverage. #6. Bipolar disorder - check lithium levels. #7. Parkinson's disease - continue home medications. #8. Chronic indwelling Foley's catheter. #9. Recent fall  with C2 fracture and scalp laceration - continue cervical collar and follow with neurosurgeon as scheduled as outpatient. #10. Left shoulder pain - check x-ray of the shoulder to rule out fractures.   CODE STATUS - full code.  Eduard Clos. 06/12/2012, 10:08 PM

## 2012-06-13 ENCOUNTER — Encounter (HOSPITAL_COMMUNITY): Payer: Self-pay | Admitting: Family Medicine

## 2012-06-13 ENCOUNTER — Inpatient Hospital Stay (HOSPITAL_COMMUNITY): Payer: Medicare Other

## 2012-06-13 DIAGNOSIS — N179 Acute kidney failure, unspecified: Secondary | ICD-10-CM

## 2012-06-13 DIAGNOSIS — R6510 Systemic inflammatory response syndrome (SIRS) of non-infectious origin without acute organ dysfunction: Secondary | ICD-10-CM

## 2012-06-13 DIAGNOSIS — E782 Mixed hyperlipidemia: Secondary | ICD-10-CM

## 2012-06-13 DIAGNOSIS — G20 Parkinson's disease: Secondary | ICD-10-CM

## 2012-06-13 LAB — URINE CULTURE
Colony Count: >=100000
Culture  Setup Time: 201306191304

## 2012-06-13 LAB — GLUCOSE, CAPILLARY
Glucose-Capillary: 138 mg/dL — ABNORMAL HIGH (ref 70–99)
Glucose-Capillary: 183 mg/dL — ABNORMAL HIGH (ref 70–99)

## 2012-06-13 LAB — CBC
Hemoglobin: 12.2 g/dL — ABNORMAL LOW (ref 13.0–17.0)
MCH: 30.5 pg (ref 26.0–34.0)
RBC: 4 MIL/uL — ABNORMAL LOW (ref 4.22–5.81)

## 2012-06-13 LAB — LACTIC ACID, PLASMA: Lactic Acid, Venous: 1.3 mmol/L (ref 0.5–2.2)

## 2012-06-13 LAB — CREATININE, SERUM
Creatinine, Ser: 1.59 mg/dL — ABNORMAL HIGH (ref 0.50–1.35)
GFR calc Af Amer: 47 mL/min — ABNORMAL LOW (ref >90)
GFR calc non Af Amer: 41 mL/min — ABNORMAL LOW (ref >90)

## 2012-06-13 LAB — CARDIAC PANEL(CRET KIN+CKTOT+MB+TROPI)
Total CK: 22 U/L (ref 7–232)
Troponin I: <0.3 ng/mL (ref <0.30)

## 2012-06-13 MED ORDER — DEXTROSE 5 % IV SOLN
2.0000 g | INTRAVENOUS | Status: DC
  Administered 2012-06-13: 2 g via INTRAVENOUS
  Filled 2012-06-13 (×2): qty 2

## 2012-06-13 MED ORDER — DEXTROSE-NACL 5-0.9 % IV SOLN
INTRAVENOUS | Status: DC
  Administered 2012-06-13 – 2012-06-15 (×5): via INTRAVENOUS

## 2012-06-13 NOTE — Progress Notes (Signed)
Notified Dr. Butler Denmark patients chronic foley cath appears to be leaking urine on underpad. Order that I may change and try to go up in size. Also made MD aware of MRSA positive in urine and placed on contact.

## 2012-06-13 NOTE — Progress Notes (Signed)
Previously today found pad under bottom was slightly wet I was unsure if foley was leaking I cleaned him and he remains dry no need to change foley at this time.

## 2012-06-13 NOTE — Progress Notes (Signed)
CRITICAL VALUE ALERT  Critical value received:  Lithium-1.57  Date of notification:  06/13/12  Time of notification:  0000  Critical value read back:yes  Nurse who received alert:  Hinton Rao, RN  MD notified (1st page):  Merdis Delay, NP  Time of first page:  0000  MD notified (2nd page):  Time of second page:  Responding MD:  Merdis Delay, NP  Time MD responded:  [phone]

## 2012-06-13 NOTE — Clinical Social Work Psychosocial (Signed)
     Clinical Social Work Department BRIEF PSYCHOSOCIAL ASSESSMENT 06/13/2012  Patient:  [name], [name]     Account Number:  [id number]     Admit date:  06/12/2012  Clinical Social Worker:  Margaree Mackintosh  Date/Time:  06/13/2012 02:03 PM  Referred by:  Physician  Date Referred:  06/13/2012 Referred for  SNF Placement   Other Referral:   Interview type:  Patient Other interview type:   with sitter present.    PSYCHOSOCIAL DATA Living Status:  FACILITY Admitted from facility:  [facility], [place] Level of care:  Skilled Nursing Facility Primary support name:  [phone] & Gloria-Sitter Primary support relationship to patient:  SPOUSE Degree of support available:   Adequate:  Gloria-Private Duty Sitterx31month    CURRENT CONCERNS Current Concerns  Post-Acute Placement   Other Concerns:    SOCIAL WORK ASSESSMENT / PLAN Clinical Automotive engineer recieved referral from MD and RNCM due to pt being from SNF. This CSW is familiar with this pt and support system due to previous admits to the hospital. CSW reviewed chart, and met with pt and his private duty sitter.  CSW provided support and opportunity for pt/sitter to process feelings.  Pt currently appears to have difficulty engaging in full conversation but is oriented to person.  Sitter describes this behavior as new. Sitter expressed concern with pt's current SNF placement.  CSW to continue to follow and assist as needed.   Assessment/plan status:  Information/Referral to [facility] Other assessment/ plan:   Information/referral to community resources:   SNF    PATIENTS/FAMILYS RESPONSE TO PLAN OF CARE: Pt currently engaged in conversation, but appears to be frustrated.  Sitter expanded stating that pt is frustrated with several hospitalizations.  Sitter was engaged in conversation and thanked CSW for intervention.

## 2012-06-13 NOTE — Progress Notes (Addendum)
[facility]  Interim history: Patient's caregiver is in the room. She has been taking care of him for  6months and states that prior to getting Pneumonia on 04/09/12, he was walking and doing well. Since the pneumonia, he has lost weight and has become very confused.  He follows with Dr Anne Hahn for his Parkinsons. W/u for his confusion was done by neurology and included an MRI which showed no acute stroke and global atrophy. CT of head on 05/17/12 showed ventriculomegaly and possibly NPH. Patient underwent a high volume LP on 05/31/12 however mental status did not improve. Spine fluid analysis was unremarkable other than traumatic tap.  He was admitted to [facility] on 6/18 for his decline in health. He was found to have an MSSA UTI. He was given Rocephin for 3 days and then switched to Bactrim. He was sent to a nursing facility for rehab on 06/07/12 but fell soon after he arrived there, sustaining a C2 fracture  aand scalp laceration. He was brought back to the ER where the lac was stapled. NS recommended a C collar for the fracture and f/u in 2 wks in their office.  He returned to the facility the same evening.  His cregiver states that at while at the the nursing facility, he developed severe pain and BOO due to foley displacement. When the night nurse eventually changed the foley, it collected about 1700 cc immediately which was initially bloody.   Per caregiver he developed a new cough and fever on 6/26. He was given a diagnosis of pneumonia  and dehydration and admitted. He was apparently still taking the Bactrim when he returned.    Antibiotics: Zithromax- 6/26 Cefepime- 6/26 Rocephn x 1 dose on 6/26 Levofloxacin- 6/27 Vancomycin- 6/27  Subjective: He continues to be confused. Eating very well but blood glucose has been low. RN does not report and diarrhea or vomiting. There is fluid noted on the pad and suspicion that the foley may be leaking.   Objective: Blood pressure 140/50, pulse  46, temperature 98.4 F (36.9 C), temperature source Oral, resp. rate 18, height 5\' 10"  (1.778 m), weight 61.4 kg (135 lb 5.8 oz), SpO2 98.00%. Weight change:   Intake/Output Summary (Last 24 hours) at 06/13/12 1353 Last data filed at 06/13/12 0600  Gross per 24 hour  Intake   1390 ml  Output    375 ml  Net   1015 ml    Physical Exam: General appearance: alert, uncooperative and confused, restless Head: staples in left scalp- small amount of blood noted which may be a few hrs old- his head is sitting against the headboard and  he may have hit it.  Throat: oral mucosa dry Lungs: decreased breath sounds - listened to anteriorly- mild crackles  In RLL ZOX:WRUEAVW rate and rhythm, S1, S2 normal, no murmur, click, rub or gallop Abdomen: soft, non-tender; bowel sounds normal; no masses,  no organomegaly Extremities: extremities normal, atraumatic, no cyanosis or edema   Lab Results:  Basename 06/13/12 0004 06/12/12 1830  NA -- 136  K -- 4.4  CL -- 105  CO2 -- 21  GLUCOSE -- 99  BUN -- 49*  CREATININE 1.59* 1.79*  CALCIUM -- 9.2  MG -- --  PHOS -- --    Basename 06/12/12 1830  AST 10  ALT <5  ALKPHOS 77  BILITOT 0.3  PROT 5.3*  ALBUMIN 3.3*   No results found for this basename: LIPASE:2,AMYLASE:2 in the last 72 hours  Basename 06/13/12 0004 06/12/12  1830  WBC 13.8* 16.1*  NEUTROABS -- 14.7*  HGB 12.2* 12.5*  HCT 37.1* 37.4*  MCV 92.8 91.9  PLT 307 299    Basename 06/12/12 2349  CKTOTAL 22  CKMB 1.9  CKMBINDEX --  TROPONINI <0.30   No components found with this basename: POCBNP:3 No results found for this basename: DDIMER:2 in the last 72 hours No results found for this basename: HGBA1C:2 in the last 72 hours No results found for this basename: CHOL:2,HDL:2,LDLCALC:2,TRIG:2,CHOLHDL:2,LDLDIRECT:2 in the last 72 hours No results found for this basename: TSH,T4TOTAL,FREET3,T3FREE,THYROIDAB in the last 72 hours No results found for this basename:  VITAMINB12:2,FOLATE:2,FERRITIN:2,TIBC:2,IRON:2,RETICCTPCT:2 in the last 72 hours  Micro Results: Recent Results (from the past 240 hour(s))  MRSA PCR SCREENING     Status: Normal   Collection Time   06/04/12 11:55 AM      Component Value Range Status Comment   MRSA by PCR NEGATIVE  NEGATIVE Final   URINE CULTURE     Status: Normal   Collection Time   06/05/12  8:11 AM      Component Value Range Status Comment   Specimen Description URINE, RANDOM   Final    Special Requests CX ADDED 06/05/12 1230   Final    Culture  Setup Time [id number]   Final    Colony Count >=100,000 COLONIES/ML   Final    Culture     Final    Value: METHICILLIN RESISTANT STAPHYLOCOCCUS AUREUS     Note: RIFAMPIN AND GENTAMICIN SHOULD NOT BE USED AS SINGLE DRUGS FOR TREATMENT OF STAPH INFECTIONS.   Report Status 06/13/2012 FINAL   Final    Organism ID, Bacteria METHICILLIN RESISTANT STAPHYLOCOCCUS AUREUS   Final   URINE CULTURE     Status: Normal   Collection Time   06/07/12 10:25 PM      Component Value Range Status Comment   Specimen Description URINE, CATHETERIZED   Final    Special Requests NONE   Final    Culture  Setup Time [id number]   Final    Colony Count 95,000 COLONIES/ML   Final    Culture     Final    Value: Multiple bacterial morphotypes present, none predominant. Suggest appropriate recollection if clinically indicated.   Report Status 06/08/2012 FINAL   Final   MRSA PCR SCREENING     Status: Normal   Collection Time   06/12/12 11:26 PM      Component Value Range Status Comment   MRSA by PCR NEGATIVE  NEGATIVE Final     Studies/Results: reviewed  Medications: Scheduled Meds:   . sodium chloride   Intravenous STAT  . azithromycin  500 mg Intravenous Once  . carbidopa-levodopa  1 tablet Oral TID  . ceFEPime (MAXIPIME) IV  2 g Intravenous Q24H  . cefTRIAXone (ROCEPHIN)  IV  1 g Intravenous Once  . enoxaparin  40 mg Subcutaneous Q24H  . entacapone  200 mg Oral TID  . insulin aspart   0-9 Units Subcutaneous TID WC  . lamoTRIgine  150 mg Oral BID  . levofloxacin (LEVAQUIN) IV  750 mg Intravenous Q48H  . pantoprazole  40 mg Oral Q1200  . sertraline  100 mg Oral Daily  . sodium chloride  1,000 mL Intravenous Once  . sodium chloride  3 mL Intravenous Q12H  . vancomycin  1,000 mg Intravenous Q24H  . DISCONTD: ceFEPime (MAXIPIME) IV  2 g Intravenous Q12H  . DISCONTD: lithium carbonate  300 mg Oral BID WC  Continuous Infusions:   . dextrose 5 % and 0.9% NaCl    . DISCONTD: sodium chloride 150 mL/hr at 06/13/12 0043   PRN Meds:.acetaminophen, acetaminophen, LORazepam, ondansetron (ZOFRAN) IV, ondansetron  Assessment/Plan: Principal Problem:  *SIRS PNA? Will follow up CXR after hydration and monitor his cough. Cont to cover for HCAP.  UTI- results now reveal the urine from 6/19 was actually MRSA and not MSSA  Will treat for 7 days with Vanc. Repeat culture ordered.    Diabetes mellitus/ Hypoglycemia Holding metformin, starting D5NS D/c sliding scale  AKI on CKD Cont to hydrate - likely dehydration and bactrim induced.    Bipolar 1 disorder/  Encephalopathy Per caretaker, he has been "mean" and "rude" since his mental status changed. Cont Zoloft. Will assess after treating his infections- may need psych eval.   Elevated Depakote Level This is mild- Depakote has not been given- will recheck level in AM   Parkinson disease Cont Sinemet and Comtan  Code Status: Full Code Family Communication: Raekwan Spelman- [phone] is POA (pts 2nd ex wife) Disposition: cont to follow in SDU  [facility] [phone] 06/13/2012, 1:53 PM  LOS: 1 day

## 2012-06-13 NOTE — Progress Notes (Signed)
ANTIBIOTIC CONSULT NOTE - follow up  Pharmacy Consult for Vancomycin/Cefepime/Levaquin Indication: pneumonia  Allergies  Allergen Reactions  . Cholestatin Other (See Comments)    Per MAR     Vital Signs: Temp: 98.6 F (37 C) (06/27 0839) Temp src: Oral (06/27 0839) BP: 114/57 mmHg (06/27 0839) Pulse Rate: 51  (06/27 0839)  Labs:  Basename 06/13/12 0004 06/12/12 1830  WBC 13.8* 16.1*  HGB 12.2* 12.5*  PLT 307 299  LABCREA -- --  CREATININE 1.59* 1.79*    Microbiology: Recent Results (from the past 720 hour(s))  MRSA PCR SCREENING     Status: Normal   Collection Time   06/04/12 11:55 AM      Component Value Range Status Comment   MRSA by PCR NEGATIVE  NEGATIVE Final   URINE CULTURE     Status: Normal   Collection Time   06/05/12  8:11 AM      Component Value Range Status Comment   Specimen Description URINE, RANDOM   Final    Special Requests CX ADDED 06/05/12 1230   Final    Culture  Setup Time [id number]   Final    Colony Count >=100,000 COLONIES/ML   Final    Culture     Final    Value: STAPHYLOCOCCUS AUREUS     Note: RIFAMPIN AND GENTAMICIN SHOULD NOT BE USED AS SINGLE DRUGS FOR TREATMENT OF STAPH INFECTIONS.   Report Status 06/08/2012 FINAL   Final    Organism ID, Bacteria STAPHYLOCOCCUS AUREUS   Final   URINE CULTURE     Status: Normal   Collection Time   06/07/12 10:25 PM      Component Value Range Status Comment   Specimen Description URINE, CATHETERIZED   Final    Special Requests NONE   Final    Culture  Setup Time [id number]   Final    Colony Count 95,000 COLONIES/ML   Final    Culture     Final    Value: Multiple bacterial morphotypes present, none predominant. Suggest appropriate recollection if clinically indicated.   Report Status 06/08/2012 FINAL   Final   MRSA PCR SCREENING     Status: Normal   Collection Time   06/12/12 11:26 PM      Component Value Range Status Comment   MRSA by PCR NEGATIVE  NEGATIVE Final      Assessment: 77 y/o  male patient admitted with altered mental status requiring broad spectrum antibiotics for pneumonia. Patient recently hospitalized for C2 fracture. Noted elevated scr, will renally adjust antibiotics.  Goal of Therapy:  Vancomycin trough level 15-20 mcg/ml  Plan:  Vancomycin 1g IV q24h, cefepime 2g IV q24h and levaquin 750mg  IV q48h. Follow up c&s and monitor renal function. Measure antibiotic drug levels at steady state  Celedonio Miyamoto, PharmD, [facility] Clinical Pharmacist Pager [phone]  06/13/2012,8:41 AM

## 2012-06-14 DIAGNOSIS — N179 Acute kidney failure, unspecified: Secondary | ICD-10-CM

## 2012-06-14 DIAGNOSIS — G20 Parkinson's disease: Secondary | ICD-10-CM

## 2012-06-14 DIAGNOSIS — G9341 Metabolic encephalopathy: Secondary | ICD-10-CM

## 2012-06-14 DIAGNOSIS — E782 Mixed hyperlipidemia: Secondary | ICD-10-CM

## 2012-06-14 LAB — GLUCOSE, CAPILLARY
Glucose-Capillary: 104 mg/dL — ABNORMAL HIGH (ref 70–99)
Glucose-Capillary: 143 mg/dL — ABNORMAL HIGH (ref 70–99)

## 2012-06-14 LAB — URINE CULTURE

## 2012-06-14 LAB — LITHIUM LEVEL: Lithium Lvl: 0.72 mEq/L — ABNORMAL LOW (ref 0.80–1.40)

## 2012-06-14 MED ORDER — LITHIUM CARBONATE 150 MG PO CAPS
150.0000 mg | ORAL_CAPSULE | Freq: Two times a day (BID) | ORAL | Status: DC
  Administered 2012-06-14 – 2012-06-15 (×3): 150 mg via ORAL
  Filled 2012-06-14 (×4): qty 1

## 2012-06-14 NOTE — Progress Notes (Signed)
Clinical Child psychotherapist spoke with [employer] by phone.  CSW updated POA and provided opportunity for POA to process questions/concerns.  CSW validated POA's feelings of sadness/frustration related to recurrent hospitalizations and SNF placement.  POA expressed hopefulness with locating a SNF to meet pt's needs.  CSW reviewed current bed options.  POA requested if there are additional bed offers to have them emailed to her due to varying work hours.  [email or web address].   CSW updated sitter.  CSW to continue to follow and assist as needed.   Angelia Mould, MSW, [place] [phone]

## 2012-06-14 NOTE — Progress Notes (Signed)
Clinical Child psychotherapist met briefly with Personnel officer and provided support.  CSW spoke with Carollee Herter at [facility] to express family's interest.  Jasmine December stated she will submit information to DON for review.  Carollee Herter is familiar with family as they have left "several messages" with her.  CSW left voicemail message with Eber Jones, pt's HCPOA; of note, Eber Jones is a Financial controller.  CSW to continue to follow and assist as needed.   CSW placed FL2 in shadow chart for MD review/signature.  Angelia Mould, MSW, [place] [phone]

## 2012-06-14 NOTE — Progress Notes (Signed)
Clinical Social Worker met with pt's POA at bedside.  CSW provided opportunity for POA to process questions/concerns. CSW to continue to follow and assist as needed.   Angelia Mould, MSW, [place] [phone]

## 2012-06-14 NOTE — Progress Notes (Signed)
[facility] [place] TEAM 1 - Stepdown/ICU TEAM  PCP:  [name], MD  Subjective: 77 year-old male with history of hypertension, Parkinson disease, diabetes mellitus type 2, and chronic indwelling Foley catheter was brought from the nursing home because patient was found to be increasingly confused. Patient also was having cough with ? Fever.  In the ER patient was initially found to be hypotensive with leukocytosis.    The pt is interactive today, but remains mildly confused.  I have had a lengthy discussion with the pt as well as his caregiver/POA at the bedside.  The pt currently denies f/c, sob, n/v, or abdom pain.    Objective:  Intake/Output Summary (Last 24 hours) at 06/14/12 1442 Last data filed at 06/14/12 1237  Gross per 24 hour  Intake 3712.58 ml  Output   1050 ml  Net 2662.58 ml   Blood pressure 135/62, pulse 53, temperature 98.3 F (36.8 C), temperature source Oral, resp. rate 19, height 5\' 10"  (1.778 m), weight 62.9 kg (138 lb 10.7 oz), SpO2 100.00%.  CBG (last 3)   Basename 06/14/12 1234 06/14/12 0510 06/13/12 1849  GLUCAP 139* 116* 127*   Physical Exam: General: No acute respiratory distress - cervical collar in place and well tolerated Lungs: Clear to auscultation bilaterally without wheezes or crackles Cardiovascular: Regular rate and rhythm without murmur gallop or rub normal S1 and S2 Abdomen: Nontender, nondistended, soft, bowel sounds positive, no rebound, no ascites, no appreciable mass Extremities: No significant cyanosis, clubbing, or edema bilateral lower extremities  Lab Results:  [facility] 06/13/12 0004 06/12/12 1830  NA -- 136  K -- 4.4  CL -- 105  CO2 -- 21  GLUCOSE -- 99  BUN -- 49*  CREATININE 1.59* 1.79*  CALCIUM -- 9.2  MG -- --  PHOS -- --    Basename 06/12/12 1830  AST 10  ALT <5  ALKPHOS 77  BILITOT 0.3  PROT 5.3*  ALBUMIN 3.3*    Basename 06/13/12 0004 06/12/12 1830  WBC 13.8* 16.1*  NEUTROABS -- 14.7*  HGB  12.2* 12.5*  HCT 37.1* 37.4*  MCV 92.8 91.9  PLT 307 299    Basename 06/12/12 2349  CKTOTAL 22  CKMB 1.9  CKMBINDEX --  TROPONINI <0.30   Micro Results: Recent Results (from the past 240 hour(s))  URINE CULTURE     Status: Normal   Collection Time   06/05/12  8:11 AM      Component Value Range Status Comment   Specimen Description URINE, RANDOM   Final    Special Requests CX ADDED 06/05/12 1230   Final    Culture  Setup Time [id number]   Final    Colony Count >=100,000 COLONIES/ML   Final    Culture     Final    Value: METHICILLIN RESISTANT STAPHYLOCOCCUS AUREUS     Note: RIFAMPIN AND GENTAMICIN SHOULD NOT BE USED AS SINGLE DRUGS FOR TREATMENT OF STAPH INFECTIONS.   Report Status 06/13/2012 FINAL   Final    Organism ID, Bacteria METHICILLIN RESISTANT STAPHYLOCOCCUS AUREUS   Final   URINE CULTURE     Status: Normal   Collection Time   06/07/12 10:25 PM      Component Value Range Status Comment   Specimen Description URINE, CATHETERIZED   Final    Special Requests NONE   Final    Culture  Setup Time [id number]   Final    Colony Count 95,000 COLONIES/ML   Final    Culture  Final    Value: Multiple bacterial morphotypes present, none predominant. Suggest appropriate recollection if clinically indicated.   Report Status 06/08/2012 FINAL   Final   URINE CULTURE     Status: Normal   Collection Time   06/12/12  6:31 PM      Component Value Range Status Comment   Specimen Description URINE, CLEAN CATCH   Final    Special Requests NONE   Final    Culture  Setup Time [id number]   Final    Colony Count 25,000 COLONIES/ML   Final    Culture     Final    Value: Multiple bacterial morphotypes present, none predominant. Suggest appropriate recollection if clinically indicated.   Report Status 06/14/2012 FINAL   Final   CULTURE, BLOOD (ROUTINE X 2)     Status: Normal (Preliminary result)   Collection Time   06/12/12  6:35 PM      Component Value Range Status Comment   Specimen  Description BLOOD ARM LEFT   Final    Special Requests BOTTLES DRAWN AEROBIC ONLY 8CC   Final    Culture  Setup Time [id number]   Final    Culture     Final    Value:        BLOOD CULTURE RECEIVED NO GROWTH TO DATE CULTURE WILL BE HELD FOR 5 DAYS BEFORE ISSUING A FINAL NEGATIVE REPORT   Report Status PENDING   Incomplete   CULTURE, BLOOD (ROUTINE X 2)     Status: Normal (Preliminary result)   Collection Time   06/12/12  6:45 PM      Component Value Range Status Comment   Specimen Description BLOOD HAND LEFT   Final    Special Requests BOTTLES DRAWN AEROBIC ONLY 5CC   Final    Culture  Setup Time [id number]   Final    Culture     Final    Value:        BLOOD CULTURE RECEIVED NO GROWTH TO DATE CULTURE WILL BE HELD FOR 5 DAYS BEFORE ISSUING A FINAL NEGATIVE REPORT   Report Status PENDING   Incomplete   MRSA PCR SCREENING     Status: Normal   Collection Time   06/12/12 11:26 PM      Component Value Range Status Comment   MRSA by PCR NEGATIVE  NEGATIVE Final     Studies/Results: All recent x-ray/radiology reports have been reviewed in detail.   Medications: I have reviewed the patient's complete medication list.  Assessment/Plan:  Subacute altered MS Per family, has been an issue since his diag of PNA in April 2013 - has had outpt MRI w/o acute findings - CT head May '13 suggested possible NPH, but a high volume LP on May 31, 2012 did not affect a change in his MS and was also unremarkable for an infectious etiology - at present his MS is likely being affected by his UTI, Li toxicity (now resolved), a possible concussion, and DH - I am concerned, however, that he may be suffering with the onset of dementia - we will tx identifiable issue and follow for improvement  Recent Staph aureus UTI - MRSA (diagnosed 06/05/2012) Was initially tx as MSSA, but culture actually confirms MRSA - continue Vanc - urine cx from this hospital stay not helpful - plan to complete minimum of 7 days of  Vanc  Acute renal failure Likely simply due to [facility] - cont IVF and follow trend  Recent fall w/ known C2 fx -  diagnosed 06/07/2012 Small minimally displaced fracture involving the anterior aspect of the right transverse process of C2, at the superior articular surface - maintain in cervical collar w/ outpt NS f/u  Parkinson's disease Followed by Dr. Anne Hahn in the outpatient setting - cont current med tx - no evidence of movement difficulty at present  Lithium toxicity  F/u level confirms resolution/normalization of Li level - keep hydrated - resume Li dosing for bipolar d/o and follow level  DM2 Having some hypoglycemia earlier, but this appears to have resolved  Bipolar d/o Resume Li as able   Lonia Blood, MD [facility] Office  [phone] Pager [phone]  On-Call/Text Page:      [email or web address]      password [facility]

## 2012-06-15 DIAGNOSIS — G20 Parkinson's disease: Secondary | ICD-10-CM

## 2012-06-15 DIAGNOSIS — N179 Acute kidney failure, unspecified: Secondary | ICD-10-CM

## 2012-06-15 DIAGNOSIS — E782 Mixed hyperlipidemia: Secondary | ICD-10-CM

## 2012-06-15 DIAGNOSIS — G20A1 Parkinson's disease without dyskinesia, without mention of fluctuations: Secondary | ICD-10-CM

## 2012-06-15 DIAGNOSIS — G9341 Metabolic encephalopathy: Secondary | ICD-10-CM

## 2012-06-15 LAB — GLUCOSE, CAPILLARY
Glucose-Capillary: 126 mg/dL — ABNORMAL HIGH (ref 70–99)
Glucose-Capillary: 135 mg/dL — ABNORMAL HIGH (ref 70–99)
Glucose-Capillary: 107 mg/dL — ABNORMAL HIGH (ref 70–99)

## 2012-06-15 LAB — CBC
HCT: 36.1 % — ABNORMAL LOW (ref 39.0–52.0)
Hemoglobin: 12.1 g/dL — ABNORMAL LOW (ref 13.0–17.0)
MCV: 90.7 fL (ref 78.0–100.0)
RBC: 3.98 MIL/uL — ABNORMAL LOW (ref 4.22–5.81)
WBC: 8.6 10*3/uL (ref 4.0–10.5)

## 2012-06-15 LAB — BASIC METABOLIC PANEL
CO2: 20 mEq/L (ref 19–32)
Chloride: 111 mEq/L (ref 96–112)
Sodium: 141 mEq/L (ref 135–145)

## 2012-06-15 MED ORDER — INSULIN ASPART 100 UNIT/ML ~~LOC~~ SOLN
0.0000 [IU] | Freq: Three times a day (TID) | SUBCUTANEOUS | Status: DC
  Administered 2012-06-15: 1 [IU] via SUBCUTANEOUS
  Administered 2012-06-15: 2 [IU] via SUBCUTANEOUS

## 2012-06-15 MED ORDER — LORAZEPAM 0.5 MG PO TABS
0.5000 mg | ORAL_TABLET | Freq: Four times a day (QID) | ORAL | Status: DC | PRN
  Administered 2012-06-15 (×2): 0.5 mg via ORAL
  Filled 2012-06-15 (×2): qty 1

## 2012-06-15 MED ORDER — AMLODIPINE BESYLATE 5 MG PO TABS
5.0000 mg | ORAL_TABLET | Freq: Every day | ORAL | Status: DC
  Administered 2012-06-15 – 2012-06-19 (×5): 5 mg via ORAL
  Filled 2012-06-15 (×5): qty 1

## 2012-06-15 MED ORDER — SODIUM CHLORIDE 0.9 % IV SOLN
INTRAVENOUS | Status: DC
  Administered 2012-06-15 – 2012-06-17 (×2): via INTRAVENOUS

## 2012-06-15 NOTE — Progress Notes (Addendum)
ANTIBIOTIC CONSULT NOTE - FOLLOW UP  Pharmacy Consult for vancomycin Indication: UTI  Allergies  Allergen Reactions  . Cholestatin Other (See Comments)    Per MAR    Patient Measurements: Height: 5\' 10"  (177.8 cm) Weight: 138 lb 10.7 oz (62.9 kg) IBW/kg (Calculated) : 73  Adjusted Body Weight:   Vital Signs: Temp: 97.4 F (36.3 C) (06/29 0830) Temp src: Oral (06/29 0830) BP: 126/68 mmHg (06/29 1203) Pulse Rate: 48  (06/29 1000) Intake/Output from previous day: 06/28 0701 - 06/29 0700 In: 1100 [I.V.:1100] Out: 1700 [Urine:1700] Intake/Output from this shift: Total I/O In: 470 [P.O.:120; I.V.:350] Out: 300 [Urine:300]  Labs:  Basename 06/15/12 0330 06/13/12 0004 06/12/12 1830  WBC 8.6 13.8* 16.1*  HGB 12.1* 12.2* 12.5*  PLT 294 307 299  LABCREA -- -- --  CREATININE 0.85 1.59* 1.79*   Estimated Creatinine Clearance: 66.8 ml/min (by C-G formula based on Cr of 0.85). No results found for this basename: VANCOTROUGH:2,VANCOPEAK:2,VANCORANDOM:2,GENTTROUGH:2,GENTPEAK:2,GENTRANDOM:2,TOBRATROUGH:2,TOBRAPEAK:2,TOBRARND:2,AMIKACINPEAK:2,AMIKACINTROU:2,AMIKACIN:2, in the last 72 hours   Microbiology: Recent Results (from the past 720 hour(s))  MRSA PCR SCREENING     Status: Normal   Collection Time   06/04/12 11:55 AM      Component Value Range Status Comment   MRSA by PCR NEGATIVE  NEGATIVE Final   URINE CULTURE     Status: Normal   Collection Time   06/05/12  8:11 AM      Component Value Range Status Comment   Specimen Description URINE, RANDOM   Final    Special Requests CX ADDED 06/05/12 1230   Final    Culture  Setup Time [id number]   Final    Colony Count >=100,000 COLONIES/ML   Final    Culture     Final    Value: METHICILLIN RESISTANT STAPHYLOCOCCUS AUREUS     Note: RIFAMPIN AND GENTAMICIN SHOULD NOT BE USED AS SINGLE DRUGS FOR TREATMENT OF STAPH INFECTIONS.   Report Status 06/13/2012 FINAL   Final    Organism ID, Bacteria METHICILLIN RESISTANT STAPHYLOCOCCUS  AUREUS   Final   URINE CULTURE     Status: Normal   Collection Time   06/07/12 10:25 PM      Component Value Range Status Comment   Specimen Description URINE, CATHETERIZED   Final    Special Requests NONE   Final    Culture  Setup Time [id number]   Final    Colony Count 95,000 COLONIES/ML   Final    Culture     Final    Value: Multiple bacterial morphotypes present, none predominant. Suggest appropriate recollection if clinically indicated.   Report Status 06/08/2012 FINAL   Final   URINE CULTURE     Status: Normal   Collection Time   06/12/12  6:31 PM      Component Value Range Status Comment   Specimen Description URINE, CLEAN CATCH   Final    Special Requests NONE   Final    Culture  Setup Time [id number]   Final    Colony Count 25,000 COLONIES/ML   Final    Culture     Final    Value: Multiple bacterial morphotypes present, none predominant. Suggest appropriate recollection if clinically indicated.   Report Status 06/14/2012 FINAL   Final   CULTURE, BLOOD (ROUTINE X 2)     Status: Normal (Preliminary result)   Collection Time   06/12/12  6:35 PM      Component Value Range Status Comment   Specimen Description BLOOD ARM LEFT  Final    Special Requests BOTTLES DRAWN AEROBIC ONLY 8CC   Final    Culture  Setup Time [id number]   Final    Culture     Final    Value:        BLOOD CULTURE RECEIVED NO GROWTH TO DATE CULTURE WILL BE HELD FOR 5 DAYS BEFORE ISSUING A FINAL NEGATIVE REPORT   Report Status PENDING   Incomplete   CULTURE, BLOOD (ROUTINE X 2)     Status: Normal (Preliminary result)   Collection Time   06/12/12  6:45 PM      Component Value Range Status Comment   Specimen Description BLOOD HAND LEFT   Final    Special Requests BOTTLES DRAWN AEROBIC ONLY 5CC   Final    Culture  Setup Time [id number]   Final    Culture     Final    Value:        BLOOD CULTURE RECEIVED NO GROWTH TO DATE CULTURE WILL BE HELD FOR 5 DAYS BEFORE ISSUING A FINAL NEGATIVE REPORT   Report  Status PENDING   Incomplete   MRSA PCR SCREENING     Status: Normal   Collection Time   06/12/12 11:26 PM      Component Value Range Status Comment   MRSA by PCR NEGATIVE  NEGATIVE Final     Anti-infectives     Start     Dose/Rate Route Frequency Ordered Stop   06/13/12 2200   ceFEPIme (MAXIPIME) 2 g in dextrose 5 % 50 mL IVPB  Status:  Discontinued        2 g 100 mL/hr over 30 Minutes Intravenous Every 24 hours 06/13/12 0839 06/14/12 1622   06/12/12 2300   ceFEPIme (MAXIPIME) 2 g in dextrose 5 % 50 mL IVPB  Status:  Discontinued        2 g 100 mL/hr over 30 Minutes Intravenous Every 12 hours 06/12/12 2151 06/13/12 0839   06/12/12 2300   levofloxacin (LEVAQUIN) IVPB 750 mg  Status:  Discontinued        750 mg 100 mL/hr over 90 Minutes Intravenous Every 48 hours 06/12/12 2151 06/14/12 1622   06/12/12 2200   vancomycin (VANCOCIN) IVPB 1000 mg/200 mL premix        1,000 mg 200 mL/hr over 60 Minutes Intravenous Every 24 hours 06/12/12 2151     06/12/12 2000   cefTRIAXone (ROCEPHIN) 1 g in dextrose 5 % 50 mL IVPB        1 g 100 mL/hr over 30 Minutes Intravenous  Once 06/12/12 1950 06/12/12 2107   06/12/12 2000   azithromycin (ZITHROMAX) 500 mg in dextrose 5 % 250 mL IVPB        500 mg 250 mL/hr over 60 Minutes Intravenous  Once 06/12/12 1950 06/12/12 2217          Assessment: Patient is a 77 y.o M with hx of recent MRSA UTI on vancomycin day #3 with plan to treat for a minimum of 7 days.  Cultures have been negative thus far.  Patient is afebrile with WBC trending down.  Scr continues for improve with 0.85 today.  With changing renal function, will plan on checking vancomycin level tonight to ensure current dose is still appropriate.    Goal of Therapy:  Vancomycin trough level 15-20 mcg/ml  Plan:  1) check vancomycin level with dose tonight  Rajohn Henery P 06/15/2012,1:00 PM

## 2012-06-15 NOTE — Progress Notes (Signed)
[facility] [place] TEAM 1 - Stepdown/ICU TEAM  PCP:  [name], MD  Subjective: 77 year-old male with history of hypertension, Parkinson disease, diabetes mellitus type 2, and chronic indwelling Foley catheter was brought from the nursing home because patient was found to be increasingly confused. Patient also was having cough with ? Fever.  In the ER patient was initially found to be hypotensive with leukocytosis.    The pt appears to be more lucid this morning.  He denies specific complaints.  His bedside sitter agrees that he seems to be more like his usual self.  He denies n/v, sob, f/c, or abdom pain.    Objective:  Intake/Output Summary (Last 24 hours) at 06/15/12 0938 Last data filed at 06/14/12 1700  Gross per 24 hour  Intake    750 ml  Output   1350 ml  Net   -600 ml   Blood pressure 140/65, pulse 50, temperature 97.4 F (36.3 C), temperature source Oral, resp. rate 12, height 5\' 10"  (1.778 m), weight 62.9 kg (138 lb 10.7 oz), SpO2 100.00%.  CBG (last 3)   Basename 06/15/12 0824 06/14/12 2243 06/14/12 1749  GLUCAP 135* 126* 143*   Physical Exam: General: No acute respiratory distress - cervical collar in place and remains well tolerated Lungs: Clear to auscultation bilaterally without wheezes or crackles Cardiovascular: Regular rate and rhythm without murmur gallop or rub normal S1 and S2 Abdomen: Nontender, nondistended, soft, bowel sounds positive, no rebound, no ascites, no appreciable mass Extremities: No significant cyanosis, clubbing, or edema bilateral lower extremities  Lab Results:  Basename 06/15/12 0330 06/13/12 0004 06/12/12 1830  NA 141 -- 136  K 4.1 -- 4.4  CL 111 -- 105  CO2 20 -- 21  GLUCOSE 116* -- 99  BUN 11 -- 49*  CREATININE 0.85 1.59* 1.79*  CALCIUM 9.0 -- 9.2  MG -- -- --  PHOS -- -- --    Basename 06/12/12 1830  AST 10  ALT <5  ALKPHOS 77  BILITOT 0.3  PROT 5.3*  ALBUMIN 3.3*    Basename 06/15/12 0330 06/13/12 0004  06/12/12 1830  WBC 8.6 13.8* 16.1*  NEUTROABS -- -- 14.7*  HGB 12.1* 12.2* 12.5*  HCT 36.1* 37.1* 37.4*  MCV 90.7 92.8 91.9  PLT 294 307 299    Basename 06/12/12 2349  CKTOTAL 22  CKMB 1.9  CKMBINDEX --  TROPONINI <0.30   Micro Results: Recent Results (from the past 240 hour(s))  URINE CULTURE     Status: Normal   Collection Time   06/07/12 10:25 PM      Component Value Range Status Comment   Specimen Description URINE, CATHETERIZED   Final    Special Requests NONE   Final    Culture  Setup Time [id number]   Final    Colony Count 95,000 COLONIES/ML   Final    Culture     Final    Value: Multiple bacterial morphotypes present, none predominant. Suggest appropriate recollection if clinically indicated.   Report Status 06/08/2012 FINAL   Final   URINE CULTURE     Status: Normal   Collection Time   06/12/12  6:31 PM      Component Value Range Status Comment   Specimen Description URINE, CLEAN CATCH   Final    Special Requests NONE   Final    Culture  Setup Time [id number]   Final    Colony Count 25,000 COLONIES/ML   Final    Culture  Final    Value: Multiple bacterial morphotypes present, none predominant. Suggest appropriate recollection if clinically indicated.   Report Status 06/14/2012 FINAL   Final   CULTURE, BLOOD (ROUTINE X 2)     Status: Normal (Preliminary result)   Collection Time   06/12/12  6:35 PM      Component Value Range Status Comment   Specimen Description BLOOD ARM LEFT   Final    Special Requests BOTTLES DRAWN AEROBIC ONLY 8CC   Final    Culture  Setup Time [id number]   Final    Culture     Final    Value:        BLOOD CULTURE RECEIVED NO GROWTH TO DATE CULTURE WILL BE HELD FOR 5 DAYS BEFORE ISSUING A FINAL NEGATIVE REPORT   Report Status PENDING   Incomplete   CULTURE, BLOOD (ROUTINE X 2)     Status: Normal (Preliminary result)   Collection Time   06/12/12  6:45 PM      Component Value Range Status Comment   Specimen Description BLOOD HAND  LEFT   Final    Special Requests BOTTLES DRAWN AEROBIC ONLY 5CC   Final    Culture  Setup Time [id number]   Final    Culture     Final    Value:        BLOOD CULTURE RECEIVED NO GROWTH TO DATE CULTURE WILL BE HELD FOR 5 DAYS BEFORE ISSUING A FINAL NEGATIVE REPORT   Report Status PENDING   Incomplete   MRSA PCR SCREENING     Status: Normal   Collection Time   06/12/12 11:26 PM      Component Value Range Status Comment   MRSA by PCR NEGATIVE  NEGATIVE Final     Studies/Results: All recent x-ray/radiology reports have been reviewed in detail.   Medications: I have reviewed the patient's complete medication list.  Assessment/Plan:  Subacute altered MS Per family, has been an issue since his diag of PNA in April 2013 - has had outpt MRI w/o acute findings - CT head May '13 suggested possible NPH, but a high volume LP on May 31, 2012 did not affect a change in his MS and was also unremarkable for an infectious etiology - at present his MS is likely being affected by his UTI, Li toxicity (now resolved), a possible concussion, and DH - I am concerned, however, that he may be suffering with the onset of dementia - we will tx identifiable issue and follow for improvement - mental status appears to be improving as his DH resolves and his abx continue   Recent Staph aureus UTI - MRSA (diagnosed 06/05/2012) Was initially discussed as MSSA, but culture actually confirms MRSA - continue Vanc - urine cx from this hospital stay not helpful - plan to complete minimum of 7 days of Vanc  Acute renal failure Likely simply due to [facility] - resolved w/ IVF  Recent fall w/ known C2 fx - diagnosed 06/07/2012 Small minimally displaced fracture involving the anterior aspect of the right transverse process of C2, at the superior articular surface - maintain in cervical collar w/ outpt NS f/u  Parkinson's disease Followed by Dr. Anne Hahn in the outpatient setting - cont current med tx - no evidence of movement  difficulty at present - I am concerned about the onset of Parkinson's associated dementia - will defer to his outpt Neurologist in f/u   Lithium toxicity  F/u level confirms resolution/normalization of Li level - keep  hydrated - resume Li dosing for bipolar d/o and follow level in AM  DM2 Having some hypoglycemia earlier, but this appears to have resolved  Bipolar d/o Resume Li as able   Dispo Stable for transfer to medical bed - will need to complete IV vanc course, begin PT/OT, follow Li level, and watch MS - suspect he will require another 72hrs+ of hospitalization  Lonia Blood, MD [facility] Office  [phone] Pager [phone]  On-Call/Text Page:      [email or web address]      password [facility]

## 2012-06-16 DIAGNOSIS — G9341 Metabolic encephalopathy: Secondary | ICD-10-CM | POA: Diagnosis present

## 2012-06-16 DIAGNOSIS — N390 Urinary tract infection, site not specified: Secondary | ICD-10-CM

## 2012-06-16 DIAGNOSIS — A4902 Methicillin resistant Staphylococcus aureus infection, unspecified site: Secondary | ICD-10-CM

## 2012-06-16 DIAGNOSIS — N179 Acute kidney failure, unspecified: Secondary | ICD-10-CM

## 2012-06-16 DIAGNOSIS — E782 Mixed hyperlipidemia: Secondary | ICD-10-CM

## 2012-06-16 DIAGNOSIS — G20 Parkinson's disease: Secondary | ICD-10-CM

## 2012-06-16 LAB — GLUCOSE, CAPILLARY
Glucose-Capillary: 97 mg/dL (ref 70–99)
Glucose-Capillary: 111 mg/dL — ABNORMAL HIGH (ref 70–99)

## 2012-06-16 LAB — BASIC METABOLIC PANEL
CO2: 22 mEq/L (ref 19–32)
Chloride: 110 mEq/L (ref 96–112)
Glucose, Bld: 108 mg/dL — ABNORMAL HIGH (ref 70–99)
Potassium: 4.2 mEq/L (ref 3.5–5.1)
Sodium: 140 mEq/L (ref 135–145)

## 2012-06-16 LAB — LITHIUM LEVEL: Lithium Lvl: 0.4 mEq/L — ABNORMAL LOW (ref 0.80–1.40)

## 2012-06-16 MED ORDER — LORAZEPAM 1 MG PO TABS
1.0000 mg | ORAL_TABLET | Freq: Four times a day (QID) | ORAL | Status: DC | PRN
  Administered 2012-06-16 (×2): 1 mg via ORAL
  Filled 2012-06-16 (×2): qty 1

## 2012-06-16 MED ORDER — LORAZEPAM 1 MG PO TABS
1.0000 mg | ORAL_TABLET | ORAL | Status: DC | PRN
  Administered 2012-06-16 – 2012-06-18 (×3): 1 mg via ORAL
  Filled 2012-06-16 (×3): qty 1

## 2012-06-16 MED ORDER — INSULIN ASPART 100 UNIT/ML ~~LOC~~ SOLN
0.0000 [IU] | Freq: Three times a day (TID) | SUBCUTANEOUS | Status: DC
  Administered 2012-06-17 – 2012-06-18 (×2): 1 [IU] via SUBCUTANEOUS

## 2012-06-16 MED ORDER — LITHIUM CARBONATE 150 MG PO CAPS
150.0000 mg | ORAL_CAPSULE | Freq: Two times a day (BID) | ORAL | Status: DC
  Administered 2012-06-16 – 2012-06-19 (×7): 150 mg via ORAL
  Filled 2012-06-16 (×9): qty 1

## 2012-06-16 MED ORDER — VANCOMYCIN HCL IN DEXTROSE 1-5 GM/200ML-% IV SOLN
1000.0000 mg | Freq: Two times a day (BID) | INTRAVENOUS | Status: DC
  Administered 2012-06-16 – 2012-06-17 (×4): 1000 mg via INTRAVENOUS
  Filled 2012-06-16 (×5): qty 200
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
## 2012-06-16 NOTE — Progress Notes (Signed)
ANTIBIOTIC CONSULT NOTE - FOLLOW UP  Pharmacy Consult for vancomycin Indication: UTI  Allergies  Allergen Reactions  . Cholestatin Other (See Comments)    Per [facility]    Patient Measurements: Height: 5\' 10"  (177.8 cm) Weight: 138 lb 10.7 oz (62.9 kg) IBW/kg (Calculated) : 73   Vital Signs: Temp: 98.2 F (36.8 C) (06/29 2116) Temp src: Oral (06/29 2116) BP: 150/79 mmHg (06/29 2116) Pulse Rate: 54  (06/29 2116) Intake/Output from previous day: 06/29 0701 - 06/30 0700 In: 857.5 [P.O.:120; I.V.:737.5] Out: 1900 [Urine:1900] Intake/Output from this shift: Total I/O In: -  Out: 1200 [Urine:1200]  Labs:  [facility] 06/15/12 0330  WBC 8.6  HGB 12.1*  PLT 294  LABCREA --  CREATININE 0.85   Estimated Creatinine Clearance: 66.8 ml/min (by C-G formula based on Cr of 0.85).  Basename 06/15/12 2100  VANCOTROUGH 6.6*  VANCOPEAK --  Drue Dun --  GENTTROUGH --  GENTPEAK --  GENTRANDOM --  TOBRATROUGH --  TOBRAPEAK --  TOBRARND --  AMIKACINPEAK --  AMIKACINTROU --  AMIKACIN --     Microbiology: Recent Results (from the past 720 hour(s))  MRSA PCR SCREENING     Status: Normal   Collection Time   06/04/12 11:55 AM      Component Value Range Status Comment   MRSA by PCR NEGATIVE  NEGATIVE Final   URINE CULTURE     Status: Normal   Collection Time   06/05/12  8:11 AM      Component Value Range Status Comment   Specimen Description URINE, RANDOM   Final    Special Requests CX ADDED 06/05/12 1230   Final    Culture  Setup Time [id number]   Final    Colony Count >=100,000 COLONIES/ML   Final    Culture     Final    Value: METHICILLIN RESISTANT STAPHYLOCOCCUS AUREUS     Note: RIFAMPIN AND GENTAMICIN SHOULD NOT BE USED AS SINGLE DRUGS FOR TREATMENT OF STAPH INFECTIONS.   Report Status 06/13/2012 FINAL   Final    Organism ID, Bacteria METHICILLIN RESISTANT STAPHYLOCOCCUS AUREUS   Final   URINE CULTURE     Status: Normal   Collection Time   06/07/12 10:25 PM   Component Value Range Status Comment   Specimen Description URINE, CATHETERIZED   Final    Special Requests NONE   Final    Culture  Setup Time [id number]   Final    Colony Count 95,000 COLONIES/ML   Final    Culture     Final    Value: Multiple bacterial morphotypes present, none predominant. Suggest appropriate recollection if clinically indicated.   Report Status 06/08/2012 FINAL   Final   URINE CULTURE     Status: Normal   Collection Time   06/12/12  6:31 PM      Component Value Range Status Comment   Specimen Description URINE, CLEAN CATCH   Final    Special Requests NONE   Final    Culture  Setup Time [id number]   Final    Colony Count 25,000 COLONIES/ML   Final    Culture     Final    Value: Multiple bacterial morphotypes present, none predominant. Suggest appropriate recollection if clinically indicated.   Report Status 06/14/2012 FINAL   Final   CULTURE, BLOOD (ROUTINE X 2)     Status: Normal (Preliminary result)   Collection Time   06/12/12  6:35 PM      Component Value Range Status Comment  Specimen Description BLOOD ARM LEFT   Final    Special Requests BOTTLES DRAWN AEROBIC ONLY 8CC   Final    Culture  Setup Time [id number]   Final    Culture     Final    Value:        BLOOD CULTURE RECEIVED NO GROWTH TO DATE CULTURE WILL BE HELD FOR 5 DAYS BEFORE ISSUING A FINAL NEGATIVE REPORT   Report Status PENDING   Incomplete   CULTURE, BLOOD (ROUTINE X 2)     Status: Normal (Preliminary result)   Collection Time   06/12/12  6:45 PM      Component Value Range Status Comment   Specimen Description BLOOD HAND LEFT   Final    Special Requests BOTTLES DRAWN AEROBIC ONLY 5CC   Final    Culture  Setup Time [id number]   Final    Culture     Final    Value:        BLOOD CULTURE RECEIVED NO GROWTH TO DATE CULTURE WILL BE HELD FOR 5 DAYS BEFORE ISSUING A FINAL NEGATIVE REPORT   Report Status PENDING   Incomplete   MRSA PCR SCREENING     Status: Normal   Collection Time   06/12/12  11:26 PM      Component Value Range Status Comment   MRSA by PCR NEGATIVE  NEGATIVE Final     Anti-infectives     Start     Dose/Rate Route Frequency Ordered Stop   06/13/12 2200   ceFEPIme (MAXIPIME) 2 g in dextrose 5 % 50 mL IVPB  Status:  Discontinued        2 g 100 mL/hr over 30 Minutes Intravenous Every 24 hours 06/13/12 0839 06/14/12 1622   06/12/12 2300   ceFEPIme (MAXIPIME) 2 g in dextrose 5 % 50 mL IVPB  Status:  Discontinued        2 g 100 mL/hr over 30 Minutes Intravenous Every 12 hours 06/12/12 2151 06/13/12 0839   06/12/12 2300   levofloxacin (LEVAQUIN) IVPB 750 mg  Status:  Discontinued        750 mg 100 mL/hr over 90 Minutes Intravenous Every 48 hours 06/12/12 2151 06/14/12 1622   06/12/12 2200   vancomycin (VANCOCIN) IVPB 1000 mg/200 mL premix        1,000 mg 200 mL/hr over 60 Minutes Intravenous Every 24 hours 06/12/12 2151     06/12/12 2000   cefTRIAXone (ROCEPHIN) 1 g in dextrose 5 % 50 mL IVPB        1 g 100 mL/hr over 30 Minutes Intravenous  Once 06/12/12 1950 06/12/12 2107   06/12/12 2000   azithromycin (ZITHROMAX) 500 mg in dextrose 5 % 250 mL IVPB        500 mg 250 mL/hr over 60 Minutes Intravenous  Once 06/12/12 1950 06/12/12 2217          Assessment: Patient is a 77 y.o M with hx of recent MRSA UTI on vancomycin day #4 with plan to treat for a minimum of 7 days.  Cultures have been negative thus far.  Patient is afebrile with WBC trending down.  Scr continues to improve and UOP is good. Vancomycin trough last night = 6.6 mcg/ml (subtherapeutic) on 1 gm IV q24h.  Goal of Therapy:  Vancomycin trough level 15-20 mcg/ml  Plan:  1) Change vancomycin to 1gm IV q12h 2) Consider another trough at [facility] on new dose if to continue > 7 days  Rolan Bucco  Keiana Tavella, PharmD, BCPS Clinical pharmacist, pager [phone] 06/16/2012,5:45 AM

## 2012-06-16 NOTE — Consult Note (Addendum)
Infectious Diseases Initial Consultation  Reason for Consultation:  Assessment & abtx recs for sepsis and hx of MRSA UTI from chronic indwelling foley catheter   HPI: [name] is a 77 y.o. male parkinson's disease, DM, chronic foley with recurrent UTI, SNF resident, had decline in his mental status. He was recently admitted from 6/18-6/21 to eval for NPH, but was found to have MRSA + urine culture that treated as a UTI with bactrim (to have finished antibiotics on 6/28). It is difficult to assess by notes, how ill the patient was at the time of the UA/urine cx was taken on 6/19. In addition, we are unable to tell if this was collected from an indwelling catheter or a new catheter placement. No  blood cultures were taken to determine if this was a disseminated infection. He has had a chronic foley for numerous years, 5-7 yrs, for reportedly recurrent UTI, but this has  not  Been well documented in his chart.  He was discharged to SNF on 6/21, however he returned to the ED after a fall at the nursing home where he sustained scalp abrasion and a small C2 fracture for which he was placed in c-spine collar and sent back to the facility that same day. He returned to the ED on 6/26 because of patient was found to be increasingly confused, with fever, and slight cough. In the ER patient was initially found to be hypotensive, with systolic in 90s and responded to IVF. He had leukocytosis of 16K with left shift of 91%N, but at this time he is afebrile and chest x-ray shows atelectasis. Patient will be admitted for further management. Presently patient denies any chest pain and is not short of breath. Has not had any nausea or vomiting or diarrhea. He denies having fevers, nightsweats, rigors, chills. He had urine culture that showed polymicrobial pathogens, and blood cultures which are negative. He was placed initial as CAP coverage with ceftriaxone and azithro -> but changed to HCAP to vanco, levo, cefepime,  which was subsequently discontinued on day #2 and now has been on vancomycin and improved. Blood cx have remained negative. Leukocytosis improved and remains afebrile during this admission.  Abtx day : #5 vanco #5  Cefepime 6/26-6/27 azithro 6/26 Ceftriaxone 6/26 Levo 6/26  (came in on TMP/SMX since 6/21)  Past Medical History  Diagnosis Date  . Hearing aid worn   . Hypertension   . Parkinson disease   . Pneumonia   . Diabetes mellitus type II   . Arthritis   . Bipolar disorder   . Anxiety   . Depression   . MRSA infection (methicillin-resistant Staphylococcus aureus)   . Chronic indwelling foley catheter   . C2 cervical fracture     due to pt fall    Allergies:  Allergies  Allergen Reactions  . Cholestatin Other (See Comments)    Per [facility]    MEDICATIONS:    . amLODipine  5 mg Oral Daily  . carbidopa-levodopa  1 tablet Oral TID  . entacapone  200 mg Oral TID  . insulin aspart  0-9 Units Subcutaneous TID WC  . lamoTRIgine  150 mg Oral BID  . lithium carbonate  150 mg Oral BID WC  . pantoprazole  40 mg Oral Q1200  . sertraline  100 mg Oral Daily  . vancomycin  1,000 mg Intravenous Q12H  . DISCONTD: insulin aspart  0-9 Units Subcutaneous TID WC  . DISCONTD: lithium carbonate  150 mg Oral BID WC  .  DISCONTD: vancomycin  1,000 mg Intravenous Q24H    History  Substance Use Topics  . Smoking status: Never Smoker   . Smokeless tobacco: Never Used  . Alcohol Use: Yes     occasional    Family History  Problem Relation Age of Onset  . Depression Father     Review of Systems  Constitutional: Negative for fever, chills, diaphoresis, activity change, appetite change, fatigue and unexpected weight change.  HENT: Negative for congestion, sore throat, rhinorrhea, sneezing, trouble swallowing and sinus pressure.  Eyes: Negative for photophobia and visual disturbance.  Respiratory: Negative for cough, chest tightness, shortness of breath, wheezing and stridor.    Cardiovascular: Negative for chest pain, palpitations and leg swelling.  Gastrointestinal: Negative for nausea, vomiting, abdominal pain, diarrhea, constipation, blood in stool, abdominal distention and anal bleeding.  Genitourinary: Negative for dysuria, hematuria, flank pain and difficulty urinating.  Musculoskeletal: Negative for myalgias, back pain, joint swelling, arthralgias and gait problem.  Skin: Negative for color change, pallor, rash and wound.  Neurological: Negative for dizziness, tremors, weakness and light-headedness.  Psychiatric/Behavioral: depressed mood   OBJECTIVE: Temp:  [97.8 F (36.6 C)-98.3 F (36.8 C)] 98.1 F (36.7 C) (06/30 0556) Pulse Rate:  [51-57] 52  (06/30 0556) Resp:  [15-17] 17  (06/30 0556) BP: (110-158)/(68-84) 158/84 mmHg (06/30 0556) SpO2:  [97 %-100 %] 98 % (06/30 0556) Weight:  [139 lb 8 oz (63.277 kg)] 139 lb 8 oz (63.277 kg) (06/30 0556)  General: chronically ill appearing No acute respiratory distress - cervical collar in place and well tolerated, cachetic appearing  HEENT: [place]/ staples to left frontal temple area from recent abrasion, healing echymoses. Slight icterus. Poor dentition, dry oral mucosa Lungs: Clear to auscultation bilaterally without wheezes or crackles  Cardiovascular: Regular rate and rhythm without murmur gallop or rub normal S1 and S2  Abdomen: Nontender, nondistended, soft, bowel sounds positive, no rebound, no ascites, no appreciable mass  GU: indwelling foley, with clear yellow urine Extremities: No significant cyanosis, clubbing, or edema bilateral lower extremities Skin: marked onychomycosis of fingernails of right hand. ? Possible splinter hemorrhages to left 3rd digit  LABS: Results for orders placed during the hospital encounter of 06/12/12 (from the past 48 hour(s))  GLUCOSE, CAPILLARY     Status: Abnormal   Collection Time   06/14/12 12:34 PM      Component Value Range Comment   Glucose-Capillary 139 (*) 70 -  99 mg/dL    Comment 1 Notify RN     GLUCOSE, CAPILLARY     Status: Abnormal   Collection Time   06/14/12  5:49 PM      Component Value Range Comment   Glucose-Capillary 143 (*) 70 - 99 mg/dL    Comment 1 Notify RN     GLUCOSE, CAPILLARY     Status: Abnormal   Collection Time   06/14/12 10:43 PM      Component Value Range Comment   Glucose-Capillary 126 (*) 70 - 99 mg/dL   BASIC METABOLIC PANEL     Status: Abnormal   Collection Time   06/15/12  3:30 AM      Component Value Range Comment   Sodium 141  135 - 145 mEq/L    Potassium 4.1  3.5 - 5.1 mEq/L    Chloride 111  96 - 112 mEq/L    CO2 20  19 - 32 mEq/L    Glucose, Bld 116 (*) 70 - 99 mg/dL    BUN 11  6 -  23 mg/dL    Creatinine, Ser 1.61  0.50 - 1.35 mg/dL    Calcium 9.0  8.4 - 09.6 mg/dL    GFR calc non Af Amer 83 (*) >90 mL/min    GFR calc Af Amer >90  >90 mL/min   CBC     Status: Abnormal   Collection Time   06/15/12  3:30 AM      Component Value Range Comment   WBC 8.6  4.0 - 10.5 K/uL    RBC 3.98 (*) 4.22 - 5.81 MIL/uL    Hemoglobin 12.1 (*) 13.0 - 17.0 g/dL    HCT 04.5 (*) 40.9 - 52.0 %    MCV 90.7  78.0 - 100.0 fL    MCH 30.4  26.0 - 34.0 pg    MCHC 33.5  30.0 - 36.0 g/dL    RDW 81.1  91.4 - 78.2 %    Platelets 294  150 - 400 K/uL   GLUCOSE, CAPILLARY     Status: Abnormal   Collection Time   06/15/12  8:24 AM      Component Value Range Comment   Glucose-Capillary 135 (*) 70 - 99 mg/dL    Comment 1 Documented in Chart      Comment 2 Notify RN     GLUCOSE, CAPILLARY     Status: Abnormal   Collection Time   06/15/12  1:06 PM      Component Value Range Comment   Glucose-Capillary 165 (*) 70 - 99 mg/dL    Comment 1 Documented in Chart      Comment 2 Notify RN     GLUCOSE, CAPILLARY     Status: Abnormal   Collection Time   06/15/12  5:06 PM      Component Value Range Comment   Glucose-Capillary 128 (*) 70 - 99 mg/dL   VANCOMYCIN, TROUGH     Status: Abnormal   Collection Time   06/15/12  9:00 PM      Component  Value Range Comment   Vancomycin Tr 6.6 (*) 10.0 - 20.0 ug/mL   GLUCOSE, CAPILLARY     Status: Abnormal   Collection Time   06/15/12 10:23 PM      Component Value Range Comment   Glucose-Capillary 107 (*) 70 - 99 mg/dL    Comment 1 Notify RN     BASIC METABOLIC PANEL     Status: Abnormal   Collection Time   06/16/12  7:05 AM      Component Value Range Comment   Sodium 140  135 - 145 mEq/L    Potassium 4.2  3.5 - 5.1 mEq/L    Chloride 110  96 - 112 mEq/L    CO2 22  19 - 32 mEq/L    Glucose, Bld 108 (*) 70 - 99 mg/dL    BUN 15  6 - 23 mg/dL    Creatinine, Ser 9.56  0.50 - 1.35 mg/dL    Calcium 9.0  8.4 - 21.3 mg/dL    GFR calc non Af Amer 84 (*) >90 mL/min    GFR calc Af Amer >90  >90 mL/min   LITHIUM LEVEL     Status: Abnormal   Collection Time   06/16/12  7:05 AM      Component Value Range Comment   Lithium Lvl 0.40 (*) 0.80 - 1.40 mEq/L   GLUCOSE, CAPILLARY     Status: Abnormal   Collection Time   06/16/12  7:42 AM      Component  Value Range Comment   Glucose-Capillary 118 (*) 70 - 99 mg/dL     MICRO: 4/09 blood NGTD 6/26 urine NGTd  HISTORICAL MICRO/IMAGING 6/19 urine: MRSA+ 10/12 urine: PsA( sensitive except I to gent)  Assessment/Plan:  77yo Male with Parkinson's Disease and chronic indwelling foley catheter and history of recurrent UTI presents with SIRS of unknown origin but recently treated with bactrim for MRSA UTI.  - cultures are likely negative since patient was on antibiotics from prior admission.  - there is concern that he has partially treated disseminated MRSA infection. Ideally, blood cultures should have also been drawn when they identified MRSA in his urine culture, however, it is unclear if it was a true pathogen if it was drawn from an indwelling catheter.  - recommend to continue on vancomycin, goal of 15-20.  - please get TTE, to see if any evidence of vegetation. Given his recent c2 fx, he will not be a candidate for TEE, would likely  presumptively treat as a complicated MRSA bacteremia for 4-6 wks.  - chronic foley places him at risk for recurrent uti, consider talking to [facility] to see what his GU problems is and consider evaluate for any prostatic abscess if he continues to be readmitted for uti-related problems.  - hx of left shoulder pain = seems improved, not warm; if he has increasing pain or warmth, would consider getting arthrocentesis to determine if signs of septic arthritis  - if patient worsens or has fever, may need to restart gram negative coverage.  - patient wondering if he could speak to psychiatrist for his mood.  Further recs will be provided tomorrow by Dr. Cliffton Asters.  Duke Salvia Drue Second MD M[facility] [phone]

## 2012-06-16 NOTE — Progress Notes (Signed)
[facility] PROGRESS NOTE  [name] WUJ:[id number] DOB: 10-Dec-1935 DOA: 06/12/2012   Assessment/Plan: Metabolic encephalopathy: Per family, has been an issue since his diag of PNA in April 2013 - has had outpt MRI w/o acute findings - CT head May '13 suggested possible NPH, but a high volume LP on May 31, 2012 did not affect a change in his MS and was also unremarkable for an infectious etiology  - at present his MS is likely being affected by his UTI, -concerned, however, that he may be suffering with the onset of dementia - we will tx identifiable issue and follow for improvement   SIRS (systemic inflammatory response syndrome) (06/12/2012)  resolved most likely send her to urinary tract infection.   MRSA UTI: -Back and he was discharged on 06/07/2012 and a urine culture that grew MRSA he was sent on Bactrim as an outpatient. Blood cultures at that time were not performed. Repeated blood cultures on this admission show no growth to date, his urinalysis and urine culture are inconclusive -on admission on 06/12/2012 he was started empirically on vancomycin, cefepime and Levaquin. His bottles seems to be stable. And can also as we have never documented a true bacteremic episodes MRSA. I will go ahead and get ID involved. Will continue IV   Diabetes mellitus (07/06/2011)  no hypoglycemic events, continue current treatment   Acute renal failure  Likely simply due to [facility] - resolved w/ IVF.   Recent fall w/ known C2 fx - diagnosed 06/07/2012  Small minimally displaced fracture involving the anterior aspect of the right transverse process of C2, at the superior articular surface - maintain in cervical collar w/ outpt NS f/u   Parkinson's disease  Followed by Dr. Anne Hahn in the outpatient setting - cont current med tx - no evidence of movement difficulty at present - I am concerned about the onset of Parkinson's associated dementia - will defer to his outpt Neurologist in f/u   Bipolar 1  disorder (03/04/2012)   -lithium toxicity resolved. Continue him. This probably secondary to decreased intravascular volume.   Code Status: Full code Family Communication: Patient Disposition Plan: Skilled nursing facility  Lambert Keto, MD  [facility] Pager [phone]  If 7PM-7AM, please contact night-coverage [email or web address] Password [email or web address] 06/16/2012, 10:41 AM   LOS: 4 days   Procedures:  none  Antibiotics: Zithromax- 6/26 >>6/27 Cefepime- 6/26 >>6/27 Rocephn x 1 dose on 6/26  Levofloxacin- 6/27 >>6/27 Vancomycin- 6/27   Interim History: Patient's caregiver is in the room. She has been taking care of him for 6months and states that prior to getting Pneumonia on 04/09/12, he was walking and doing well. Since the pneumonia, he has lost weight and has become very confused.  He follows with Dr Anne Hahn for his Parkinsons. W/u for his confusion was done by neurology and included an MRI which showed no acute stroke and global atrophy. CT of head on 05/17/12 showed ventriculomegaly and possibly NPH. Patient underwent a high volume LP on 05/31/12 however mental status did not improve. Spine fluid analysis was unremarkable other than traumatic tap.  He was admitted to [facility] on 6/18 for his decline in health. He was found to have an MSSA UTI. He was given Rocephin for 3 days and then switched to Bactrim. He was sent to a nursing facility for rehab on 06/07/12 but fell soon after he arrived there, sustaining a C2 fracture aand scalp laceration. He was brought back to the ER where the lac was stapled.  NS recommended a C collar for the fracture and f/u in 2 wks in their office.  He returned to the facility the same evening.   Subjective: No complain  Objective: Filed Vitals:   06/15/12 1203 06/15/12 1421 06/15/12 2116 06/16/12 0556  BP: 126/68 110/80 150/79 158/84  Pulse: 57 51 54 52  Temp: 98.3 F (36.8 C) 97.8 F (36.6 C) 98.2 F (36.8 C) 98.1 F (36.7 C)  TempSrc: Oral  Oral Oral Oral  Resp: 15 16 17 17   Height:      Weight:    63.277 kg (139 lb 8 oz)  SpO2: 100% 97% 98% 98%    Intake/Output Summary (Last 24 hours) at 06/16/12 1041 Last data filed at 06/16/12 0559  Gross per 24 hour  Intake    950 ml  Output   2400 ml  Net  -1450 ml   Weight change:   Exam:  General: Alert, awake, oriented x3, in no acute distress.  HEENT: No bruits, no goiter. C-collar in place. Heart: Regular rate and rhythm, without murmurs, rubs, gallops.  Lungs: Good air movement, bilateral air movement.  Abdomen: Soft, nontender, nondistended, positive bowel sounds.  Neuro: Grossly intact, nonfocal.   Data Reviewed: Basic Metabolic Panel:  Lab 06/16/12 1610 06/15/12 0330 06/13/12 0004 06/12/12 1830  NA 140 141 -- 136  K 4.2 4.1 -- --  CL 110 111 -- 105  CO2 22 20 -- 21  GLUCOSE 108* 116* -- 99  BUN 15 11 -- 49*  CREATININE 0.84 0.85 1.59* 1.79*  CALCIUM 9.0 9.0 -- 9.2  MG -- -- -- --  PHOS -- -- -- --   Liver Function Tests:  Lab 06/12/12 1830  AST 10  ALT <5  ALKPHOS 77  BILITOT 0.3  PROT 5.3*  ALBUMIN 3.3*   No results found for this basename: LIPASE:5,AMYLASE:5 in the last 168 hours No results found for this basename: AMMONIA:5 in the last 168 hours CBC:  Lab 06/15/12 0330 06/13/12 0004 06/12/12 1830  WBC 8.6 13.8* 16.1*  NEUTROABS -- -- 14.7*  HGB 12.1* 12.2* 12.5*  HCT 36.1* 37.1* 37.4*  MCV 90.7 92.8 91.9  PLT 294 307 299   Cardiac Enzymes:  Lab 06/12/12 2349  CKTOTAL 22  CKMB 1.9  CKMBINDEX --  TROPONINI <0.30   BNP: No components found with this basename: POCBNP:5 CBG:  Lab 06/16/12 0742 06/15/12 2223 06/15/12 1706 06/15/12 1306 06/15/12 0824  GLUCAP 118* 107* 128* 165* 135*    Recent Results (from the past 240 hour(s))  URINE CULTURE     Status: Normal   Collection Time   06/07/12 10:25 PM      Component Value Range Status Comment   Specimen Description URINE, CATHETERIZED   Final    Special Requests NONE   Final     Culture  Setup Time [id number]   Final    Colony Count 95,000 COLONIES/ML   Final    Culture     Final    Value: Multiple bacterial morphotypes present, none predominant. Suggest appropriate recollection if clinically indicated.   Report Status 06/08/2012 FINAL   Final   URINE CULTURE     Status: Normal   Collection Time   06/12/12  6:31 PM      Component Value Range Status Comment   Specimen Description URINE, CLEAN CATCH   Final    Special Requests NONE   Final    Culture  Setup Time [id number]   Final  Colony Count 25,000 COLONIES/ML   Final    Culture     Final    Value: Multiple bacterial morphotypes present, none predominant. Suggest appropriate recollection if clinically indicated.   Report Status 06/14/2012 FINAL   Final   CULTURE, BLOOD (ROUTINE X 2)     Status: Normal (Preliminary result)   Collection Time   06/12/12  6:35 PM      Component Value Range Status Comment   Specimen Description BLOOD ARM LEFT   Final    Special Requests BOTTLES DRAWN AEROBIC ONLY 8CC   Final    Culture  Setup Time [id number]   Final    Culture     Final    Value:        BLOOD CULTURE RECEIVED NO GROWTH TO DATE CULTURE WILL BE HELD FOR 5 DAYS BEFORE ISSUING A FINAL NEGATIVE REPORT   Report Status PENDING   Incomplete   CULTURE, BLOOD (ROUTINE X 2)     Status: Normal (Preliminary result)   Collection Time   06/12/12  6:45 PM      Component Value Range Status Comment   Specimen Description BLOOD HAND LEFT   Final    Special Requests BOTTLES DRAWN AEROBIC ONLY 5CC   Final    Culture  Setup Time [id number]   Final    Culture     Final    Value:        BLOOD CULTURE RECEIVED NO GROWTH TO DATE CULTURE WILL BE HELD FOR 5 DAYS BEFORE ISSUING A FINAL NEGATIVE REPORT   Report Status PENDING   Incomplete   MRSA PCR SCREENING     Status: Normal   Collection Time   06/12/12 11:26 PM      Component Value Range Status Comment   MRSA by PCR NEGATIVE  NEGATIVE Final      Studies: Dg Chest 1  View  06/12/2012  *RADIOLOGY REPORT*  Clinical Data: 77 year old male Cough, altered mental status.  CHEST - 1 VIEW  Comparison: 06/04/2012 and earlier.  Findings: Stable cardiomegaly and mediastinal contours.  No pneumothorax or pulmonary edema.  No pleural effusion.  Increased streaky opacity at the lung bases.  Multiple chronic left rib fractures.  IMPRESSION: Lower lung volumes with increased basilar atelectasis.  Original Report Authenticated By: Harley Hallmark, M.D.   Dg Chest 2 View  06/04/2012  *RADIOLOGY REPORT*  Clinical Data: Altered mental status.  CHEST - 2 VIEW  Comparison: 04/21/2012  Findings: Cardiomegaly.  Lungs are clear.  No effusions or edema. Multiple old left rib fractures, stable.  Advanced degenerative changes in the shoulders bilaterally.  IMPRESSION: Cardiomegaly.  No acute findings.  Original Report Authenticated By: Cyndie Chime, M.D.   Ct Head Wo Contrast  06/12/2012  *RADIOLOGY REPORT*  Clinical Data: 77 year old male with altered mental status. Hypertension.  Recent fall.  CT HEAD WITHOUT CONTRAST  Technique:  Contiguous axial images were obtained from the base of the skull through the vertex without contrast.  Comparison: 06/07/2012 and earlier.  Findings: Left scalp skin staples in place.  No acute scalp or orbit soft tissue abnormality.  The left tympanic cavity and mastoids remain opacified.  Trace fluid level in the left sphenoid sinus. No acute osseous abnormality identified.  Calcified atherosclerosis at the skull base.  Mega cisterna magna variant again noted. Stable ventricle size and configuration. Dural calcifications. No acute intracranial hemorrhage identified. No evidence of cortically based acute infarction identified.  No suspicious intracranial vascular hyperdensity.  IMPRESSION:  Stable brain. No acute intracranial abnormality.  Original Report Authenticated By: Harley Hallmark, M.D.   Ct Head Wo Contrast  06/07/2012  *RADIOLOGY REPORT*  Clinical Data:   Status post unwitnessed fall; laceration to the left forehead.  Assess for cervical spine injury.  CT HEAD WITHOUT CONTRAST AND CT CERVICAL SPINE WITHOUT CONTRAST  Technique:  Multidetector CT imaging of the head and cervical spine was performed following the standard protocol without intravenous contrast.  Multiplanar CT image reconstructions of the cervical spine were also generated.  Comparison: CT of the head performed 05/24/2012, MRI of the brain performed 06/04/2012, and CT of the cervical spine performed 09/06/2007  CT HEAD  Findings: There is no evidence of acute infarction, mass lesion, or intra- or extra-axial hemorrhage on CT.  Prominence of the ventricles and sulci reflects mild cortical volume loss.  Cerebellar atrophy is noted.  Diffuse periventricular and subcortical white matter change likely reflects small vessel ischemic microangiopathy.  The brainstem and fourth ventricle are within normal limits.  The basal ganglia are unremarkable in appearance.  The cerebral hemispheres demonstrate grossly normal gray-white differentiation. No mass effect or midline shift is seen.  There is no evidence of fracture; visualized osseous structures are unremarkable in appearance.  The orbits are within normal limits. The paranasal sinuses and mastoid air cells are well-aerated.  Soft tissue swelling is noted overlying the left frontoparietal calvarium.  IMPRESSION:  1.  No evidence of traumatic intracranial injury or fracture. 2.  Soft tissue swelling overlying the left frontoparietal calvarium. 3.  Mild cortical volume loss and diffuse small vessel ischemic microangiopathy.  CT CERVICAL SPINE  Findings: There is a small minimally displaced fracture involving the anterior aspect of the right transverse process of C2, at the superior articular surface. This does not appear to extend into the right transverse foramen, and associated vascular injury is considered unlikely.  This is new from 2008.  No additional  fractures are seen.  Vertebral bodies demonstrate normal height and alignment.  There is mild multilevel disc space narrowing along the lower cervical spine, with associated small anterior and posterior disc osteophyte complexes.  Facet disease is noted along the cervical spine.  Prevertebral soft tissues are within normal limits.  The thyroid gland is unremarkable in appearance.  The visualized lung apices are clear.  Mild scattered calcification is noted at the carotid bifurcations bilaterally.  IMPRESSION:  1.  Small minimally displaced fracture involving the anterior aspect of the right transverse process of C2, at the superior articular surface.  This does not appear to extend into the right transverse foramen, and associated vascular injury is considered unlikely. 2.  No additional fractures seen. 3.  Mild degenerative change along the lower cervical spine. 4.  Mild scattered calcification at the carotid bifurcations bilaterally.  These results were called by telephone on 06/07/2012  at  09:58 p.m. to  [facility] PA, who verbally acknowledged these results.  Original Report Authenticated By: Tonia Ghent, M.D.   Ct Head Wo Contrast  05/24/2012  This examination was performed at [facility] at [address] [facility]. The interpretation will be provided by [facility]  Original Report Authenticated By: Jamesetta Orleans. MATTERN, M.D.   Ct Cervical Spine Wo Contrast  06/07/2012  *RADIOLOGY REPORT*  Clinical Data:  Status post unwitnessed fall; laceration to the left forehead.  Assess for cervical spine injury.  CT HEAD WITHOUT CONTRAST AND CT CERVICAL SPINE WITHOUT CONTRAST  Technique:  Multidetector CT imaging of the head  and cervical spine was performed following the standard protocol without intravenous contrast.  Multiplanar CT image reconstructions of the cervical spine were also generated.  Comparison: CT of the head performed 05/24/2012, MRI of the brain performed  06/04/2012, and CT of the cervical spine performed 09/06/2007  CT HEAD  Findings: There is no evidence of acute infarction, mass lesion, or intra- or extra-axial hemorrhage on CT.  Prominence of the ventricles and sulci reflects mild cortical volume loss.  Cerebellar atrophy is noted.  Diffuse periventricular and subcortical white matter change likely reflects small vessel ischemic microangiopathy.  The brainstem and fourth ventricle are within normal limits.  The basal ganglia are unremarkable in appearance.  The cerebral hemispheres demonstrate grossly normal gray-white differentiation. No mass effect or midline shift is seen.  There is no evidence of fracture; visualized osseous structures are unremarkable in appearance.  The orbits are within normal limits. The paranasal sinuses and mastoid air cells are well-aerated.  Soft tissue swelling is noted overlying the left frontoparietal calvarium.  IMPRESSION:  1.  No evidence of traumatic intracranial injury or fracture. 2.  Soft tissue swelling overlying the left frontoparietal calvarium. 3.  Mild cortical volume loss and diffuse small vessel ischemic microangiopathy.  CT CERVICAL SPINE  Findings: There is a small minimally displaced fracture involving the anterior aspect of the right transverse process of C2, at the superior articular surface. This does not appear to extend into the right transverse foramen, and associated vascular injury is considered unlikely.  This is new from 2008.  No additional fractures are seen.  Vertebral bodies demonstrate normal height and alignment.  There is mild multilevel disc space narrowing along the lower cervical spine, with associated small anterior and posterior disc osteophyte complexes.  Facet disease is noted along the cervical spine.  Prevertebral soft tissues are within normal limits.  The thyroid gland is unremarkable in appearance.  The visualized lung apices are clear.  Mild scattered calcification is noted at the  carotid bifurcations bilaterally.  IMPRESSION:  1.  Small minimally displaced fracture involving the anterior aspect of the right transverse process of C2, at the superior articular surface.  This does not appear to extend into the right transverse foramen, and associated vascular injury is considered unlikely. 2.  No additional fractures seen. 3.  Mild degenerative change along the lower cervical spine. 4.  Mild scattered calcification at the carotid bifurcations bilaterally.  These results were called by telephone on 06/07/2012  at  09:58 p.m. to  [facility] PA, who verbally acknowledged these results.  Original Report Authenticated By: Tonia Ghent, M.D.   Mr Brain Wo Contrast  06/04/2012  *RADIOLOGY REPORT*  Clinical Data: Slurred speech.  Altered mental status.  MRI HEAD WITHOUT CONTRAST  Technique:  Multiplanar, multiecho pulse sequences of the brain and surrounding structures were obtained according to standard protocol without intravenous contrast.  Comparison: 05/24/2012 CT.  No comparison MR.  Findings: Motion degraded exam.  No acute infarct (artifact left parietal lobe series 4 image 25).  No intracranial hemorrhage.  Global atrophy.  Ventricular prominence may be related to atrophy although difficult to completely exclude a mild component of hydrocephalus.  Mega cisterna magna.  Mild small vessel disease type changes.  No intracranial mass lesion detected on this unenhanced exam.  Major intracranial vascular structures are patent. Prominent ectasia of the right internal carotid artery supraclinoid segment with superior displacement of the ectatic right anterior cerebral artery causing compression of the undersurface of the ventricle and the  right optic nerve with mild deformity of the optic chiasm.  Paranasal sinus mild mucosal thickening with polypoid appearance inferior right maxillary sinus.  Bilateral mastoid air cell opacification without mass seen in the region of the posterior- superior  nasopharynx causing eustachian tube dysfunction.  IMPRESSION: Motion degraded exam.  No acute infarct.  No intracranial hemorrhage.  Global atrophy.  Ventricular prominence may be related to atrophy although difficult to completely exclude a mild component of hydrocephalus.  Mega cisterna magna.  Mild small vessel disease type changes.  Prominent ectasia of the right internal carotid artery supraclinoid segment with superior displacement of the ectatic right anterior cerebral artery causing compression of the undersurface of the ventricle and the right optic nerve with mild deformity of the optic chiasm.  Paranasal sinus mild mucosal thickening with polypoid appearance inferior right maxillary sinus.  Bilateral mastoid air cell opacification.  Original Report Authenticated By: Fuller Canada, M.D.   Dg Shoulder Left  06/13/2012  *RADIOLOGY REPORT*  Clinical Data: Fall.  Will not move left arm.  Left shoulder pain.  LEFT SHOULDER - 2+ VIEW  Comparison: None.  Findings: No fracture or dislocation.  Advanced degenerative changes in the glenohumeral joint with loss of joint space, subchondral sclerosis and osteophytosis.  Degenerative changes are also seen in the left acromioclavicular joint, with slight elevation of the distal left clavicle, of unknown chronicity. The latter is seen on only one view.  Rounded ossific densities are seen inferior to the acromioclavicular joint.  Visualized portion of the left chest shows multiple old left rib fractures.  IMPRESSION:  1.  No acute findings. 2.  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 3.  Acromioclavicular joint osteoarthritis.  Elevation of the distal left clavicle with respect to the acromion on one view may indicate a strain injury, age indeterminate. 4.  Osseous densities inferior to the acromioclavicular joint may represent loose bodies.  Original Report Authenticated By: Reyes Ivan, M.D.   Dg Hand Complete Left  06/07/2012  *RADIOLOGY  REPORT*  Clinical Data: Fall, abrasions.  LEFT HAND - COMPLETE 3+ VIEW  Comparison: None.  Findings: Moderate degenerative changes throughout the IP joints, MCP joints and first carpal metacarpal joint. No acute bony abnormality.  Specifically, no fracture, subluxation, or dislocation.  Soft tissues are intact.  IMPRESSION: No acute bony abnormality.  Original Report Authenticated By: Cyndie Chime, M.D.   Dg Fluoro Guide Lumbar Puncture  05/31/2012  *RADIOLOGY REPORT*  Clinical Data:  Confusion.  Gait disturbance.  Parkinson's disease.  DIAGNOSTIC LUMBAR PUNCTURE UNDER FLUOROSCOPIC GUIDANCE  Fluoroscopy time:  23 seconds .  Technique:  Informed consent was obtained from the patient prior to the procedure, including potential complications of headache, allergy, and pain.   With the patient prone, the lower back was prepped with Betadine.  1% Lidocaine was used for local anesthesia. Lumbar puncture was performed at the right L4-5 level using a 20 gauge needle with return of slightly turbid CSF with an opening pressure of approximately 10 cm water. The patient was too debilitated tube rolled to the side.  The CSF pressure is quite low, barely reaching the have of the needle.  With raising the head, some spontaneous flow occurred.  I wound at the having to gently aspirated using a syringe to require a large volume requested.  25  ml of CSF were obtained for laboratory studies. The patient tolerated the procedure well and there were no apparent complications.  IMPRESSION: 25 ml of CSF obtained for  requested studies.  Opening pressure was quite low, 10 centimeters of water or less.  See above discussion.  Original Report Authenticated By: Thomasenia Sales, M.D.    Scheduled Meds:   . amLODipine  5 mg Oral Daily  . carbidopa-levodopa  1 tablet Oral TID  . entacapone  200 mg Oral TID  . insulin aspart  0-9 Units Subcutaneous TID WC  . lamoTRIgine  150 mg Oral BID  . lithium carbonate  150 mg Oral BID WC  .  pantoprazole  40 mg Oral Q1200  . sertraline  100 mg Oral Daily  . vancomycin  1,000 mg Intravenous Q12H  . DISCONTD: insulin aspart  0-9 Units Subcutaneous TID WC  . DISCONTD: lithium carbonate  150 mg Oral BID WC  . DISCONTD: vancomycin  1,000 mg Intravenous Q24H   Continuous Infusions:   . sodium chloride 50 mL/hr at 06/15/12 1139

## 2012-06-17 DIAGNOSIS — R7881 Bacteremia: Secondary | ICD-10-CM | POA: Diagnosis present

## 2012-06-17 DIAGNOSIS — J189 Pneumonia, unspecified organism: Secondary | ICD-10-CM

## 2012-06-17 DIAGNOSIS — F319 Bipolar disorder, unspecified: Secondary | ICD-10-CM

## 2012-06-17 DIAGNOSIS — R6510 Systemic inflammatory response syndrome (SIRS) of non-infectious origin without acute organ dysfunction: Secondary | ICD-10-CM

## 2012-06-17 LAB — GLUCOSE, CAPILLARY
Glucose-Capillary: 104 mg/dL — ABNORMAL HIGH (ref 70–99)
Glucose-Capillary: 96 mg/dL (ref 70–99)

## 2012-06-17 MED ORDER — ENOXAPARIN SODIUM 40 MG/0.4ML ~~LOC~~ SOLN
40.0000 mg | Freq: Every day | SUBCUTANEOUS | Status: DC
  Administered 2012-06-17 – 2012-06-19 (×3): 40 mg via SUBCUTANEOUS
  Filled 2012-06-17 (×3): qty 0.4

## 2012-06-17 MED ORDER — SODIUM CHLORIDE 0.9 % IV SOLN
750.0000 mg | Freq: Two times a day (BID) | INTRAVENOUS | Status: DC
  Administered 2012-06-18 – 2012-06-19 (×3): 750 mg via INTRAVENOUS
  Filled 2012-06-17 (×4): qty 750

## 2012-06-17 NOTE — Progress Notes (Signed)
Vancomycin per pharmacy consult  Indication: Complicated MRSA UTI Day #6 of Vancomycin.  Vancomycin trough = 20.4 on Vancomycin 1Gm q12h Goal vancomycin trough = 15-20.  Vancomycin trough is slightly supratherapeutic. Pt will soon be moved to a SNF to continue vancomycin for a total of 3 weeks. Previous vancomycin trough was 6.6 on 1 GM q24h. Will change dose to Vancomycin 750 Q12hrs.  Cardell Peach, PharmD

## 2012-06-17 NOTE — Progress Notes (Signed)
  Echocardiogram 2D Echocardiogram has been performed.  Georgian Co 06/17/2012, 1:25 PM

## 2012-06-17 NOTE — Plan of Care (Signed)
Problem: Phase II Progression Outcomes Goal: Progress activity as tolerated unless otherwise ordered Outcome: Completed/Met Date Met:  06/17/12 Up to chair Goal: Discharge plan established Outcome: Completed/Met Date Met:  06/17/12 [facility] with IV [facility] Goal: Obtain order to discontinue catheter if appropriate Outcome: Not Applicable Date Met:  06/17/12 Chronic foley  Problem: Phase III Progression Outcomes Goal: Voiding independently Outcome: Not Applicable Date Met:  06/17/12 Chronic foley Goal: Foley discontinued Outcome: Not Applicable Date Met:  06/17/12 Chronic foley

## 2012-06-17 NOTE — Progress Notes (Addendum)
Clinical Social Work Department CLINICAL SOCIAL WORK PLACEMENT NOTE 06/17/2012  Patient:  [name], [name]  Account Number:  [id number] Admit date:  06/12/2012  Clinical Social Worker:  Margaree Mackintosh  Date/time:  06/14/2012 12:00 M  Clinical Social Work is seeking post-discharge placement for this patient at the following level of care:   SKILLED NURSING   (*CSW will update this form in Epic as items are completed)   06/13/2012  Patient/family provided with [facility] Department of Clinical Social Work's list of facilities offering this level of care within the geographic area requested by the patient (or if unable, by the patient's family).  06/13/2012  Patient/family informed of their freedom to choose among providers that offer the needed level of care, that participate in Medicare, Medicaid or managed care program needed by the patient, have an available bed and are willing to accept the patient.  06/13/2012  Patient/family informed of [facility]' ownership interest in [facility], as well as of the fact that they are under no obligation to receive care at this facility.  PASARR submitted to [employer] on 06/13/2012 PASARR number received from [employer] on  06/14/2012  FL2 transmitted to all facilities in geographic area requested by pt/family on  06/13/2012 FL2 transmitted to all facilities within larger geographic area on   Patient informed that his/her managed care company has contracts with or will negotiate with  certain facilities, including the following:     Patient/family informed of bed offers received:  06/14/2012 Patient chooses bed at [facility] Physician recommends and patient chooses bed at  [facility]  Patient to be transferred to  on  06/18/2012 Patient to be transferred to facility by EMS  The following physician request were entered in Epic:   Additional Comments:

## 2012-06-17 NOTE — Progress Notes (Signed)
Clinical Social Worker continuing to follow for disposition planning. Clinical Social Worker contacted pt POA, Eber Jones to discuss disposition planning. Pt POA stated that bed has been chosen at [facility]. Clinical Social Worker contacted facility who confirmed pt can admit when medically stable for discharge. Pt POA reports that she has to leave out of town for work tomorrow and this Visual merchandiser encouraged pt POA to complete paperwork at facility prior to leaving for work. Clinical Social Worker to facilitate pt discharge needs when pt medically stable for discharge.  Jacklynn Lewis, MSW, LCSWA  Clinical Social Work [phone]

## 2012-06-17 NOTE — Progress Notes (Signed)
[facility] PROGRESS NOTE  [name] WUJ:[id number] DOB: 27-Jul-1935 DOA: 06/12/2012   Assessment/Plan: Metabolic encephalopathy: -Resolved -at present his MS is not likely being affected by his UTI.  SIRS (systemic inflammatory response syndrome) (06/12/2012) -resolved most likely 2/2 her to urinary tract infection.   MRSA UTI: -Back and he was discharged on 06/07/2012 and a urine culture that grew MRSA he was sent on Bactrim as an outpatient. Blood cultures at that time were not performed. Repeated blood cultures on this admission show no growth to date, his urinalysis and urine culture are inconclusive -on admission on 06/12/2012 he was started empirically on vancomycin, cefepime and Levaquin. His vitals were stable. Levaquin and cefepime were DC'd.  -we have never documented a true bacteremic episodes MRSA by blood cultures. ID he agreed to get a TTE to rule out vegetations. Will continue IV vancomycin and presumptively treat as a complicated MRSA bacteremia for 4-6 wks.  Diabetes mellitus (07/06/2011)  no hypoglycemic events, continue current treatment   Acute renal failure  Likely simply due to [facility] - resolved w/ IVF.   Recent fall w/ known C2 fx - diagnosed 06/07/2012  Small minimally displaced fracture involving the anterior aspect of the right transverse process of C2, at the superior articular surface - maintain in cervical collar w/ outpt NS f/u   Parkinson's disease  Followed by Dr. Anne Hahn in the outpatient setting - cont current med tx - no evidence of movement difficulty at present - I am concerned about the onset of Parkinson's associated dementia - will defer to his outpt Neurologist in f/u   Bipolar 1 disorder (03/04/2012)   -lithium toxicity resolved. Continue him. This probably secondary to decreased intravascular volume.   Code Status: Full code Family Communication: Patient Disposition Plan: Skilled nursing facility  Lambert Keto, MD  [facility] Pager [phone]  If 7PM-7AM, please contact night-coverage [email or web address] Password [email or web address] 06/17/2012, 8:14 AM   LOS: 5 days   Procedures:  Blood cultures 06/12/2012 negative  Antibiotics: Zithromax- 6/26 >>6/27 Cefepime- 6/26 >>6/27 Rocephn x 1 dose on 6/26  Levofloxacin- 6/27 >>6/27 Vancomycin- 6/27   Interim History: Patient's caregiver is in the room. She has been taking care of him for 6months and states that prior to getting Pneumonia on 04/09/12, he was walking and doing well. Since the pneumonia, he has lost weight and has become very confused.  He follows with Dr Anne Hahn for his Parkinsons. W/u for his confusion was done by neurology and included an MRI which showed no acute stroke and global atrophy. CT of head on 05/17/12 showed ventriculomegaly and possibly NPH. Patient underwent a high volume LP on 05/31/12 however mental status did not improve. Spine fluid analysis was unremarkable other than traumatic tap.  He was admitted to [facility] on 6/18 for his decline in health. He was found to have an MSSA UTI. He was given Rocephin for 3 days and then switched to Bactrim. He was sent to a nursing facility for rehab on 06/07/12 but fell soon after he arrived there, sustaining a C2 fracture aand scalp laceration. He was brought back to the ER where the lac was stapled. NS recommended a C collar for the fracture and f/u in 2 wks in their office.  He returned to the facility the same evening. Came back with metabolic encephalopathy most likely infectious etiology as a cause.   Subjective: No complains  Objective: Filed Vitals:   06/16/12 0556 06/16/12 1511 06/16/12 2108 06/17/12 0435  BP: 158/84  91/61 124/76 132/80  Pulse: 52 50 52 54  Temp: 98.1 F (36.7 C) 97.5 F (36.4 C) 97.4 F (36.3 C) 97.8 F (36.6 C)  TempSrc: Oral Oral Oral Oral  Resp: 17 17 17 17   Height:      Weight: 63.277 kg (139 lb 8 oz)   63.912 kg (140 lb 14.4 oz)  SpO2: 98% 98% 98% 97%     Intake/Output Summary (Last 24 hours) at 06/17/12 0814 Last data filed at 06/17/12 0600  Gross per 24 hour  Intake   1390 ml  Output   2700 ml  Net  -1310 ml   Weight change: 0.635 kg (1 lb 6.4 oz)  Exam:  General: Alert, awake, oriented x3, in no acute distress.  HEENT: No bruits, no goiter. C-collar in place. Heart: Regular rate and rhythm, without murmurs, rubs, gallops.  Lungs: Good air movement, bilateral air movement.     Data Reviewed: Basic Metabolic Panel:  Lab 06/16/12 4098 06/15/12 0330 06/13/12 0004 06/12/12 1830  NA 140 141 -- 136  K 4.2 4.1 -- --  CL 110 111 -- 105  CO2 22 20 -- 21  GLUCOSE 108* 116* -- 99  BUN 15 11 -- 49*  CREATININE 0.84 0.85 1.59* 1.79*  CALCIUM 9.0 9.0 -- 9.2  MG -- -- -- --  PHOS -- -- -- --   Liver Function Tests:  Lab 06/12/12 1830  AST 10  ALT <5  ALKPHOS 77  BILITOT 0.3  PROT 5.3*  ALBUMIN 3.3*   No results found for this basename: LIPASE:5,AMYLASE:5 in the last 168 hours No results found for this basename: AMMONIA:5 in the last 168 hours CBC:  Lab 06/15/12 0330 06/13/12 0004 06/12/12 1830  WBC 8.6 13.8* 16.1*  NEUTROABS -- -- 14.7*  HGB 12.1* 12.2* 12.5*  HCT 36.1* 37.1* 37.4*  MCV 90.7 92.8 91.9  PLT 294 307 299   Cardiac Enzymes:  Lab 06/12/12 2349  CKTOTAL 22  CKMB 1.9  CKMBINDEX --  TROPONINI <0.30   BNP: No components found with this basename: POCBNP:5 CBG:  Lab 06/17/12 0753 06/16/12 2100 06/16/12 1704 06/16/12 1210 06/16/12 0742  GLUCAP 96 104* 111* 97 118*    Recent Results (from the past 240 hour(s))  URINE CULTURE     Status: Normal   Collection Time   06/07/12 10:25 PM      Component Value Range Status Comment   Specimen Description URINE, CATHETERIZED   Final    Special Requests NONE   Final    Culture  Setup Time [id number]   Final    Colony Count 95,000 COLONIES/ML   Final    Culture     Final    Value: Multiple bacterial morphotypes present, none predominant. Suggest  appropriate recollection if clinically indicated.   Report Status 06/08/2012 FINAL   Final   URINE CULTURE     Status: Normal   Collection Time   06/12/12  6:31 PM      Component Value Range Status Comment   Specimen Description URINE, CLEAN CATCH   Final    Special Requests NONE   Final    Culture  Setup Time [id number]   Final    Colony Count 25,000 COLONIES/ML   Final    Culture     Final    Value: Multiple bacterial morphotypes present, none predominant. Suggest appropriate recollection if clinically indicated.   Report Status 06/14/2012 FINAL   Final   CULTURE, BLOOD (ROUTINE X 2)  Status: Normal (Preliminary result)   Collection Time   06/12/12  6:35 PM      Component Value Range Status Comment   Specimen Description BLOOD ARM LEFT   Final    Special Requests BOTTLES DRAWN AEROBIC ONLY 8CC   Final    Culture  Setup Time [id number]   Final    Culture     Final    Value:        BLOOD CULTURE RECEIVED NO GROWTH TO DATE CULTURE WILL BE HELD FOR 5 DAYS BEFORE ISSUING A FINAL NEGATIVE REPORT   Report Status PENDING   Incomplete   CULTURE, BLOOD (ROUTINE X 2)     Status: Normal (Preliminary result)   Collection Time   06/12/12  6:45 PM      Component Value Range Status Comment   Specimen Description BLOOD HAND LEFT   Final    Special Requests BOTTLES DRAWN AEROBIC ONLY 5CC   Final    Culture  Setup Time [id number]   Final    Culture     Final    Value:        BLOOD CULTURE RECEIVED NO GROWTH TO DATE CULTURE WILL BE HELD FOR 5 DAYS BEFORE ISSUING A FINAL NEGATIVE REPORT   Report Status PENDING   Incomplete   MRSA PCR SCREENING     Status: Normal   Collection Time   06/12/12 11:26 PM      Component Value Range Status Comment   MRSA by PCR NEGATIVE  NEGATIVE Final      Studies: Dg Chest 1 View  06/12/2012  *RADIOLOGY REPORT*  Clinical Data: 77 year old male Cough, altered mental status.  CHEST - 1 VIEW  Comparison: 06/04/2012 and earlier.  Findings: Stable cardiomegaly and  mediastinal contours.  No pneumothorax or pulmonary edema.  No pleural effusion.  Increased streaky opacity at the lung bases.  Multiple chronic left rib fractures.  IMPRESSION: Lower lung volumes with increased basilar atelectasis.  Original Report Authenticated By: Harley Hallmark, M.D.   Dg Chest 2 View  06/04/2012  *RADIOLOGY REPORT*  Clinical Data: Altered mental status.  CHEST - 2 VIEW  Comparison: 04/21/2012  Findings: Cardiomegaly.  Lungs are clear.  No effusions or edema. Multiple old left rib fractures, stable.  Advanced degenerative changes in the shoulders bilaterally.  IMPRESSION: Cardiomegaly.  No acute findings.  Original Report Authenticated By: Cyndie Chime, M.D.   Ct Head Wo Contrast  06/12/2012  *RADIOLOGY REPORT*  Clinical Data: 77 year old male with altered mental status. Hypertension.  Recent fall.  CT HEAD WITHOUT CONTRAST  Technique:  Contiguous axial images were obtained from the base of the skull through the vertex without contrast.  Comparison: 06/07/2012 and earlier.  Findings: Left scalp skin staples in place.  No acute scalp or orbit soft tissue abnormality.  The left tympanic cavity and mastoids remain opacified.  Trace fluid level in the left sphenoid sinus. No acute osseous abnormality identified.  Calcified atherosclerosis at the skull base.  Mega cisterna magna variant again noted. Stable ventricle size and configuration. Dural calcifications. No acute intracranial hemorrhage identified. No evidence of cortically based acute infarction identified.  No suspicious intracranial vascular hyperdensity.  IMPRESSION: Stable brain. No acute intracranial abnormality.  Original Report Authenticated By: Harley Hallmark, M.D.   Ct Head Wo Contrast  06/07/2012  *RADIOLOGY REPORT*  Clinical Data:  Status post unwitnessed fall; laceration to the left forehead.  Assess for cervical spine injury.  CT HEAD WITHOUT CONTRAST AND CT  CERVICAL SPINE WITHOUT CONTRAST  Technique:  Multidetector CT  imaging of the head and cervical spine was performed following the standard protocol without intravenous contrast.  Multiplanar CT image reconstructions of the cervical spine were also generated.  Comparison: CT of the head performed 05/24/2012, MRI of the brain performed 06/04/2012, and CT of the cervical spine performed 09/06/2007  CT HEAD  Findings: There is no evidence of acute infarction, mass lesion, or intra- or extra-axial hemorrhage on CT.  Prominence of the ventricles and sulci reflects mild cortical volume loss.  Cerebellar atrophy is noted.  Diffuse periventricular and subcortical white matter change likely reflects small vessel ischemic microangiopathy.  The brainstem and fourth ventricle are within normal limits.  The basal ganglia are unremarkable in appearance.  The cerebral hemispheres demonstrate grossly normal gray-white differentiation. No mass effect or midline shift is seen.  There is no evidence of fracture; visualized osseous structures are unremarkable in appearance.  The orbits are within normal limits. The paranasal sinuses and mastoid air cells are well-aerated.  Soft tissue swelling is noted overlying the left frontoparietal calvarium.  IMPRESSION:  1.  No evidence of traumatic intracranial injury or fracture. 2.  Soft tissue swelling overlying the left frontoparietal calvarium. 3.  Mild cortical volume loss and diffuse small vessel ischemic microangiopathy.  CT CERVICAL SPINE  Findings: There is a small minimally displaced fracture involving the anterior aspect of the right transverse process of C2, at the superior articular surface. This does not appear to extend into the right transverse foramen, and associated vascular injury is considered unlikely.  This is new from 2008.  No additional fractures are seen.  Vertebral bodies demonstrate normal height and alignment.  There is mild multilevel disc space narrowing along the lower cervical spine, with associated small anterior and  posterior disc osteophyte complexes.  Facet disease is noted along the cervical spine.  Prevertebral soft tissues are within normal limits.  The thyroid gland is unremarkable in appearance.  The visualized lung apices are clear.  Mild scattered calcification is noted at the carotid bifurcations bilaterally.  IMPRESSION:  1.  Small minimally displaced fracture involving the anterior aspect of the right transverse process of C2, at the superior articular surface.  This does not appear to extend into the right transverse foramen, and associated vascular injury is considered unlikely. 2.  No additional fractures seen. 3.  Mild degenerative change along the lower cervical spine. 4.  Mild scattered calcification at the carotid bifurcations bilaterally.  These results were called by telephone on 06/07/2012  at  09:58 p.m. to  [facility], who verbally acknowledged these results.  Original Report Authenticated By: Tonia Ghent, M.D.   Ct Head Wo Contrast  05/24/2012  This examination was performed at [facility] at [address] [facility]. The interpretation will be provided by [facility]  Original Report Authenticated By: Jamesetta Orleans. MATTERN, M.D.   Ct Cervical Spine Wo Contrast  06/07/2012  *RADIOLOGY REPORT*  Clinical Data:  Status post unwitnessed fall; laceration to the left forehead.  Assess for cervical spine injury.  CT HEAD WITHOUT CONTRAST AND CT CERVICAL SPINE WITHOUT CONTRAST  Technique:  Multidetector CT imaging of the head and cervical spine was performed following the standard protocol without intravenous contrast.  Multiplanar CT image reconstructions of the cervical spine were also generated.  Comparison: CT of the head performed 05/24/2012, MRI of the brain performed 06/04/2012, and CT of the cervical spine performed 09/06/2007  CT HEAD  Findings: There  is no evidence of acute infarction, mass lesion, or intra- or extra-axial hemorrhage on CT.  Prominence of the  ventricles and sulci reflects mild cortical volume loss.  Cerebellar atrophy is noted.  Diffuse periventricular and subcortical white matter change likely reflects small vessel ischemic microangiopathy.  The brainstem and fourth ventricle are within normal limits.  The basal ganglia are unremarkable in appearance.  The cerebral hemispheres demonstrate grossly normal gray-white differentiation. No mass effect or midline shift is seen.  There is no evidence of fracture; visualized osseous structures are unremarkable in appearance.  The orbits are within normal limits. The paranasal sinuses and mastoid air cells are well-aerated.  Soft tissue swelling is noted overlying the left frontoparietal calvarium.  IMPRESSION:  1.  No evidence of traumatic intracranial injury or fracture. 2.  Soft tissue swelling overlying the left frontoparietal calvarium. 3.  Mild cortical volume loss and diffuse small vessel ischemic microangiopathy.  CT CERVICAL SPINE  Findings: There is a small minimally displaced fracture involving the anterior aspect of the right transverse process of C2, at the superior articular surface. This does not appear to extend into the right transverse foramen, and associated vascular injury is considered unlikely.  This is new from 2008.  No additional fractures are seen.  Vertebral bodies demonstrate normal height and alignment.  There is mild multilevel disc space narrowing along the lower cervical spine, with associated small anterior and posterior disc osteophyte complexes.  Facet disease is noted along the cervical spine.  Prevertebral soft tissues are within normal limits.  The thyroid gland is unremarkable in appearance.  The visualized lung apices are clear.  Mild scattered calcification is noted at the carotid bifurcations bilaterally.  IMPRESSION:  1.  Small minimally displaced fracture involving the anterior aspect of the right transverse process of C2, at the superior articular surface.  This does  not appear to extend into the right transverse foramen, and associated vascular injury is considered unlikely. 2.  No additional fractures seen. 3.  Mild degenerative change along the lower cervical spine. 4.  Mild scattered calcification at the carotid bifurcations bilaterally.  These results were called by telephone on 06/07/2012  at  09:58 p.m. to  [facility], who verbally acknowledged these results.  Original Report Authenticated By: Tonia Ghent, M.D.   Mr Brain Wo Contrast  06/04/2012  *RADIOLOGY REPORT*  Clinical Data: Slurred speech.  Altered mental status.  MRI HEAD WITHOUT CONTRAST  Technique:  Multiplanar, multiecho pulse sequences of the brain and surrounding structures were obtained according to standard protocol without intravenous contrast.  Comparison: 05/24/2012 CT.  No comparison MR.  Findings: Motion degraded exam.  No acute infarct (artifact left parietal lobe series 4 image 25).  No intracranial hemorrhage.  Global atrophy.  Ventricular prominence may be related to atrophy although difficult to completely exclude a mild component of hydrocephalus.  Mega cisterna magna.  Mild small vessel disease type changes.  No intracranial mass lesion detected on this unenhanced exam.  Major intracranial vascular structures are patent. Prominent ectasia of the right internal carotid artery supraclinoid segment with superior displacement of the ectatic right anterior cerebral artery causing compression of the undersurface of the ventricle and the right optic nerve with mild deformity of the optic chiasm.  Paranasal sinus mild mucosal thickening with polypoid appearance inferior right maxillary sinus.  Bilateral mastoid air cell opacification without mass seen in the region of the posterior- superior nasopharynx causing eustachian tube dysfunction.  IMPRESSION: Motion degraded exam.  No acute  infarct.  No intracranial hemorrhage.  Global atrophy.  Ventricular prominence may be related to atrophy  although difficult to completely exclude a mild component of hydrocephalus.  Mega cisterna magna.  Mild small vessel disease type changes.  Prominent ectasia of the right internal carotid artery supraclinoid segment with superior displacement of the ectatic right anterior cerebral artery causing compression of the undersurface of the ventricle and the right optic nerve with mild deformity of the optic chiasm.  Paranasal sinus mild mucosal thickening with polypoid appearance inferior right maxillary sinus.  Bilateral mastoid air cell opacification.  Original Report Authenticated By: Fuller Canada, M.D.   Dg Shoulder Left  06/13/2012  *RADIOLOGY REPORT*  Clinical Data: Fall.  Will not move left arm.  Left shoulder pain.  LEFT SHOULDER - 2+ VIEW  Comparison: None.  Findings: No fracture or dislocation.  Advanced degenerative changes in the glenohumeral joint with loss of joint space, subchondral sclerosis and osteophytosis.  Degenerative changes are also seen in the left acromioclavicular joint, with slight elevation of the distal left clavicle, of unknown chronicity. The latter is seen on only one view.  Rounded ossific densities are seen inferior to the acromioclavicular joint.  Visualized portion of the left chest shows multiple old left rib fractures.  IMPRESSION:  1.  No acute findings. 2.  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 3.  Acromioclavicular joint osteoarthritis.  Elevation of the distal left clavicle with respect to the acromion on one view may indicate a strain injury, age indeterminate. 4.  Osseous densities inferior to the acromioclavicular joint may represent loose bodies.  Original Report Authenticated By: Reyes Ivan, M.D.   Dg Hand Complete Left  06/07/2012  *RADIOLOGY REPORT*  Clinical Data: Fall, abrasions.  LEFT HAND - COMPLETE 3+ VIEW  Comparison: None.  Findings: Moderate degenerative changes throughout the IP joints, MCP joints and first carpal metacarpal  joint. No acute bony abnormality.  Specifically, no fracture, subluxation, or dislocation.  Soft tissues are intact.  IMPRESSION: No acute bony abnormality.  Original Report Authenticated By: Cyndie Chime, M.D.   Dg Fluoro Guide Lumbar Puncture  05/31/2012  *RADIOLOGY REPORT*  Clinical Data:  Confusion.  Gait disturbance.  Parkinson's disease.  DIAGNOSTIC LUMBAR PUNCTURE UNDER FLUOROSCOPIC GUIDANCE  Fluoroscopy time:  23 seconds .  Technique:  Informed consent was obtained from the patient prior to the procedure, including potential complications of headache, allergy, and pain.   With the patient prone, the lower back was prepped with Betadine.  1% Lidocaine was used for local anesthesia. Lumbar puncture was performed at the right L4-5 level using a 20 gauge needle with return of slightly turbid CSF with an opening pressure of approximately 10 cm water. The patient was too debilitated tube rolled to the side.  The CSF pressure is quite low, barely reaching the have of the needle.  With raising the head, some spontaneous flow occurred.  I wound at the having to gently aspirated using a syringe to require a large volume requested.  25  ml of CSF were obtained for laboratory studies. The patient tolerated the procedure well and there were no apparent complications.  IMPRESSION: 25 ml of CSF obtained for requested studies.  Opening pressure was quite low, 10 centimeters of water or less.  See above discussion.  Original Report Authenticated By: Thomasenia Sales, M.D.    Scheduled Meds:    . amLODipine  5 mg Oral Daily  . carbidopa-levodopa  1 tablet Oral TID  .  entacapone  200 mg Oral TID  . insulin aspart  0-9 Units Subcutaneous TID WC  . lamoTRIgine  150 mg Oral BID  . lithium carbonate  150 mg Oral BID WC  . pantoprazole  40 mg Oral Q1200  . sertraline  100 mg Oral Daily  . vancomycin  1,000 mg Intravenous Q12H   Continuous Infusions:    . sodium chloride 50 mL/hr at 06/17/12 0548

## 2012-06-17 NOTE — Progress Notes (Signed)
Patient ID: [name], male   DOB: [birth year], 77 y.o.   MRN: [id number]    [facility]    Date of Admission:  06/12/2012   Total days of antibiotics 13        Day 6 vancomycin          Principal Problem:  *SIRS (systemic inflammatory response syndrome) Active Problems:  Diabetes mellitus  Bipolar 1 disorder  Parkinson disease  Encephalopathy  Metabolic encephalopathy  Urinary tract infection  Bacteremia of undetermined etiology      . amLODipine  5 mg Oral Daily  . carbidopa-levodopa  1 tablet Oral TID  . enoxaparin (LOVENOX) injection  40 mg Subcutaneous Daily  . entacapone  200 mg Oral TID  . insulin aspart  0-9 Units Subcutaneous TID WC  . lamoTRIgine  150 mg Oral BID  . lithium carbonate  150 mg Oral BID WC  . pantoprazole  40 mg Oral Q1200  . sertraline  100 mg Oral Daily  . vancomycin  1,000 mg Intravenous Q12H    Subjective: He states that he is feeling much better.  Objective: Temp:  [97.4 F (36.3 C)-97.8 F (36.6 C)] 97.8 F (36.6 C) (07/01 0435) Pulse Rate:  [50-54] 54  (07/01 0435) Resp:  [17] 17  (07/01 0435) BP: (91-132)/(61-80) 132/80 mmHg (07/01 0435) SpO2:  [97 %-98 %] 97 % (07/01 0435) Weight:  [63.912 kg (140 lb 14.4 oz)] 63.912 kg (140 lb 14.4 oz) (07/01 0435)  General: He is alert and comfortable. He is currently undergoing his transthoracic echocardiogram. Skin: No splinter hemorrhages Lungs: Clear clear Cor: Regular S1 and S2 no murmurs Abdomen: Soft and nontender  Lab Results Lab Results  Component Value Date   WBC 8.6 06/15/2012   HGB 12.1* 06/15/2012   HCT 36.1* 06/15/2012   MCV 90.7 06/15/2012   PLT 294 06/15/2012    Lab Results  Component Value Date   CREATININE 0.84 06/16/2012   BUN 15 06/16/2012   NA 140 06/16/2012   K 4.2 06/16/2012   CL 110 06/16/2012   CO2 22 06/16/2012    Lab Results  Component Value Date   ALT <5 06/12/2012   AST 10 06/12/2012   ALKPHOS 77 06/12/2012   BILITOT 0.3 06/12/2012       Microbiology: Recent Results (from the past 240 hour(s))  URINE CULTURE     Status: Normal   Collection Time   06/07/12 10:25 PM      Component Value Range Status Comment   Specimen Description URINE, CATHETERIZED   Final    Special Requests NONE   Final    Culture  Setup Time [id number]   Final    Colony Count 95,000 COLONIES/ML   Final    Culture     Final    Value: Multiple bacterial morphotypes present, none predominant. Suggest appropriate recollection if clinically indicated.   Report Status 06/08/2012 FINAL   Final   URINE CULTURE     Status: Normal   Collection Time   06/12/12  6:31 PM      Component Value Range Status Comment   Specimen Description URINE, CLEAN CATCH   Final    Special Requests NONE   Final    Culture  Setup Time [id number]   Final    Colony Count 25,000 COLONIES/ML   Final    Culture     Final    Value: Multiple bacterial morphotypes present, none predominant. Suggest appropriate recollection if clinically  indicated.   Report Status 06/14/2012 FINAL   Final   CULTURE, BLOOD (ROUTINE X 2)     Status: Normal (Preliminary result)   Collection Time   06/12/12  6:35 PM      Component Value Range Status Comment   Specimen Description BLOOD ARM LEFT   Final    Special Requests BOTTLES DRAWN AEROBIC ONLY 8CC   Final    Culture  Setup Time [id number]   Final    Culture     Final    Value:        BLOOD CULTURE RECEIVED NO GROWTH TO DATE CULTURE WILL BE HELD FOR 5 DAYS BEFORE ISSUING A FINAL NEGATIVE REPORT   Report Status PENDING   Incomplete   CULTURE, BLOOD (ROUTINE X 2)     Status: Normal (Preliminary result)   Collection Time   06/12/12  6:45 PM      Component Value Range Status Comment   Specimen Description BLOOD HAND LEFT   Final    Special Requests BOTTLES DRAWN AEROBIC ONLY 5CC   Final    Culture  Setup Time [id number]   Final    Culture     Final    Value:        BLOOD CULTURE RECEIVED NO GROWTH TO DATE CULTURE WILL BE HELD FOR 5 DAYS  BEFORE ISSUING A FINAL NEGATIVE REPORT   Report Status PENDING   Incomplete   MRSA PCR SCREENING     Status: Normal   Collection Time   06/12/12 11:26 PM      Component Value Range Status Comment   MRSA by PCR NEGATIVE  NEGATIVE Final     Studies/Results: No results found.  Assessment: I am seeing him for the first time today that he appears to be doing better. I agree with treating him for possibly complicated MRSA infection.  Plan: 1. Continue vancomycin for at least 3 weeks total 2. Await results of echocardiogram 3. I will followup  July 3. Please call if needed sooner.  Cliffton Asters, MD [facility] [phone] pager   [phone] cell 06/17/2012, 12:19 PM

## 2012-06-17 NOTE — Progress Notes (Signed)
Physical Therapy Treatment Patient Details Name: [name] MRN: [id number] DOB: 10-02-1935 Today's Date: 06/17/2012 Time: 4098-1191 PT Time Calculation (min): 32 min  PT Assessment / Plan / Recommendation Comments on Treatment Session  Still continues to demonstrate poor seated balance. Will need to work on this before we progress to ambulation as pt's full body proprioception is poor with poor right reactions for LOB.     Follow Up Recommendations  Skilled nursing facility    Barriers to Discharge        Equipment Recommendations  Defer to next venue    Recommendations for Other Services    Frequency     Plan Discharge plan remains appropriate;Frequency remains appropriate    Precautions / Restrictions Precautions Precautions: Fall;Cervical Required Braces or Orthoses: Cervical Brace Cervical Brace: Hard collar (on all the time)       Mobility  Bed Mobility Bed Mobility: Right Sidelying to Sit;Rolling Right Rolling Right: 3: Mod assist Right Sidelying to Sit: 3: Mod assist;HOB flat Details for Bed Mobility Assistance: mod facilitation to sequence all bed mobility, increased time to process commands, stiffness and needing assist/max cueing to bend at hips for improved sitting posture, continues to have pretty heave posterior and lateral lean (left)  Transfers Transfers: Sit to Stand;Stand to Sit Sit to Stand: 1: +2 Total assist;With upper extremity assist;From bed Sit to Stand: Patient Percentage: 70% Stand to Sit: 1: +2 Total assist;To chair/3-in-1 Stand to Sit: Patient Percentage: 40% Details for Transfer Assistance: good ability to come to standing but significant posterior/lateral lean left requring facilitation and verbal cues to correct as well as to maintain balance; max faciliation to encourage hip flexion and slow descent to chair; pt with poor motor control/processing of fluid movement Ambulation/Gait Ambulation/Gait Assistance: 1: +2 Total  assist Ambulation/Gait: Patient Percentage: 40% Ambulation Distance (Feet): 6 Feet Assistive device: Rolling walker Ambulation/Gait Assistance Details: max support to maintain balnce and prevent fall to the left (trunk supported by therapists body); cues for foot placement (BOS not under trunk/body causing lateral lean), absent right reaction, poor proprioception of body in space Gait Pattern: Trunk flexed;Festinating;Lateral trunk lean to left;Step-to pattern;Narrow base of support    Exercises General Exercises - Lower Extremity Ankle Circles/Pumps: AAROM;Both;10 reps;Seated Heel Slides: AROM;Both;10 reps;Supine    PT Goals Acute Rehab PT Goals PT Goal: Supine/Side to Sit - Progress: Progressing toward goal Pt will Sit at [facility] of Bed: with supervision;with bilateral upper extremity support;3-5 min PT Goal: Sit at Edge Of Bed - Progress: Goal set today PT Goal: Sit to Stand - Progress: Progressing toward goal PT Goal: Stand to Sit - Progress: Progressing toward goal PT Transfer Goal: Bed to Chair/Chair to Bed - Progress: Progressing toward goal PT Goal: Stand - Progress: Progressing toward goal PT Goal: Ambulate - Progress: Progressing toward goal  Visit Information  Last PT Received On: 06/17/12 Assistance Needed: +2    Subjective Data  Subjective: Did you just call me Mr. Witter? Call me Bing Plume.    Cognition  Overall Cognitive Status: Impaired Area of Impairment: Awareness of errors;Awareness of deficits Arousal/Alertness: Awake/alert Behavior During Session: [facility] for tasks performed Current Attention Level: Sustained Awareness of Errors: Assistance required to correct errors made;Assistance required to identify errors made    Balance  Static Sitting Balance Static Sitting - Balance Support: Bilateral upper extremity supported Static Sitting - Level of Assistance: 3: Mod assist Static Sitting - Comment/# of Minutes: mod facilitation to engage trunk/core for upright  posture, poor tolerance only able  to maintain upright sitting 2-3 seconds at most with manual facilitation Dynamic Sitting Balance Dynamic Sitting - Balance Support: Bilateral upper extremity supported Dynamic Sitting - Level of Assistance: 3: Mod assist Dynamic Sitting - Comments: mod facilitation for AP weight shift/hip flexion to discourage posterior LOB  End of Session PT - End of Session Equipment Utilized During Treatment: Gait belt Activity Tolerance: Patient limited by fatigue Patient left: in chair;with call bell/phone within reach;with family/visitor present Nurse Communication: Mobility status   GP     [facility] [address] 06/17/2012, 11:37 AM

## 2012-06-18 DIAGNOSIS — E119 Type 2 diabetes mellitus without complications: Secondary | ICD-10-CM

## 2012-06-18 DIAGNOSIS — G9341 Metabolic encephalopathy: Secondary | ICD-10-CM

## 2012-06-18 LAB — GLUCOSE, CAPILLARY
Glucose-Capillary: 112 mg/dL — ABNORMAL HIGH (ref 70–99)
Glucose-Capillary: 103 mg/dL — ABNORMAL HIGH (ref 70–99)

## 2012-06-18 MED ORDER — VANCOMYCIN HCL 1000 MG IV SOLR: 750.0000 mg | Freq: Two times a day (BID) | INTRAVENOUS | Status: DC

## 2012-06-18 MED ORDER — SODIUM CHLORIDE 0.9 % IJ SOLN
10.0000 mL | INTRAMUSCULAR | Status: DC | PRN
  Administered 2012-06-19: 10 mL

## 2012-06-18 MED ORDER — RAMIPRIL 5 MG PO CAPS
5.0000 mg | ORAL_CAPSULE | Freq: Two times a day (BID) | ORAL | Status: DC
  Administered 2012-06-18 – 2012-06-19 (×3): 5 mg via ORAL
  Filled 2012-06-18 (×4): qty 1

## 2012-06-18 NOTE — Progress Notes (Signed)
[name] PROGRESS NOTE  Dwayne DOLECKI JXB:[id number] DOB: 25-Aug-1935 (77 years old) DOA: 06/12/2012   Assessment/Plan: Metabolic encephalopathy: -Resolved -at present his MS is not likely being affected by his UTI.  SIRS (systemic inflammatory response syndrome) (06/12/2012) -resolved most likely 2/2 her to urinary tract infection.   MRSA UTI: -we have never documented a true bacteremic episodes MRSA by blood cultures. ID he agreed to get a TTE to rule out vegetations. Will continue IV vancomycin and presumptively treat as a complicated MRSA bacteremia for 3 wks.  Diabetes mellitus (07/06/2011)  no hypoglycemic events, continue current treatment   Acute renal failure  Likely simply due to [facility] - resolved w/ IVF.   Recent fall w/ known C2 fx - diagnosed 06/07/2012  Small minimally displaced fracture involving the anterior aspect of the right transverse process of C2, at the superior articular surface - maintain in cervical collar w/ outpt NS f/u   Parkinson's disease  Followed by Dr. Anne Hahn in the outpatient setting - cont current med tx - no evidence of movement difficulty at present - I am concerned about the onset of Parkinson's associated dementia - will defer to his outpt Neurologist in f/u   Bipolar 1 disorder (03/04/2012)   -lithium toxicity resolved. Continue him. This probably secondary to decreased intravascular volume.   Code Status: Full code Family Communication: Patient Disposition Plan: Skilled nursing facility  Lambert Keto, MD  [facility] Pager [phone]  If 7PM-7AM, please contact night-coverage [email or web address] Password [email or web address] 06/18/2012, 10:16 AM   LOS: 6 days   Procedures:  Blood cultures 06/12/2012 negative  Antibiotics: Zithromax- 6/26 >>6/27 Cefepime- 6/26 >>6/27 Rocephn x 1 dose on 6/26  Levofloxacin- 6/27 >>6/27 Vancomycin- 6/27   Interim History: Patient's caregiver is in the room. She has been taking care of him for 6months and  states that prior to getting Pneumonia on 04/09/12, he was walking and doing well. Since the pneumonia, he has lost weight and has become very confused.  He follows with Dr Anne Hahn for his Parkinsons. W/u for his confusion was done by neurology and included an MRI which showed no acute stroke and global atrophy. CT of head on 05/17/12 showed ventriculomegaly and possibly NPH. Patient underwent a high volume LP on 05/31/12 however mental status did not improve. Spine fluid analysis was unremarkable other than traumatic tap.  He was admitted to [facility] on 6/18 for his decline in health. He was found to have an MSSA UTI. Blood cultures at that time were not performed. He was given Rocephin for 3 days and then switched to Bactrim. He was sent to a nursing facility for rehab on 06/07/12 but fell soon after he arrived there, sustaining a C2 fracture aand scalp laceration. He was brought back to the ER where the lac was stapled. NS recommended a C collar for the fracture and f/u in 2 wks in their office.  He returned to the facility the same evening. Came back with metabolic encephalopathy most likely infectious etiology as a cause. Repeated blood cultures on this admission show no growth to date, his urinalysis and urine culture are inconclusive -on admission on 06/12/2012 he was started empirically on vancomycin, cefepime and Levaquin. His vitals were stable. Levaquin and cefepime were DC'd.    Subjective: No complains  Objective: Filed Vitals:   06/17/12 1326 06/17/12 2033 06/18/12 0708 06/18/12 0939  BP: 101/63 121/69 147/80 134/75  Pulse: 57 57 51   Temp: 98.3 F (36.8 C) 98.2 F (36.8 C) 98.4 F (36.9  C)   TempSrc: Oral Oral Oral   Resp: 18 20 20    Height:      Weight:   62 kg (136 lb 11 oz)   SpO2: 96% 99% 97%     Intake/Output Summary (Last 24 hours) at 06/18/12 1016 Last data filed at 06/18/12 0930  Gross per 24 hour  Intake   1580 ml  Output  16109 ml  Net  -9920 ml   Weight change:    Exam:  General: Alert, awake, oriented x3, in no acute distress.  HEENT: No bruits, no goiter. C-collar in place. Heart: Regular rate and rhythm, without murmurs, rubs, gallops.  Lungs: Good air movement, bilateral air movement.     Data Reviewed: Basic Metabolic Panel:  Lab 06/16/12 6045 06/15/12 0330 06/13/12 0004 06/12/12 1830  NA 140 141 -- 136  K 4.2 4.1 -- --  CL 110 111 -- 105  CO2 22 20 -- 21  GLUCOSE 108* 116* -- 99  BUN 15 11 -- 49*  CREATININE 0.84 0.85 1.59* 1.79*  CALCIUM 9.0 9.0 -- 9.2  MG -- -- -- --  PHOS -- -- -- --   Liver Function Tests:  Lab 06/12/12 1830  AST 10  ALT <5  ALKPHOS 77  BILITOT 0.3  PROT 5.3*  ALBUMIN 3.3*   No results found for this basename: LIPASE:5,AMYLASE:5 in the last 168 hours No results found for this basename: AMMONIA:5 in the last 168 hours CBC:  Lab 06/15/12 0330 06/13/12 0004 06/12/12 1830  WBC 8.6 13.8* 16.1*  NEUTROABS -- -- 14.7*  HGB 12.1* 12.2* 12.5*  HCT 36.1* 37.1* 37.4*  MCV 90.7 92.8 91.9  PLT 294 307 299   Cardiac Enzymes:  Lab 06/12/12 2349  CKTOTAL 22  CKMB 1.9  CKMBINDEX --  TROPONINI <0.30   BNP: No components found with this basename: POCBNP:5 CBG:  Lab 06/18/12 0802 06/17/12 2229 06/17/12 1713 06/17/12 1140 06/17/12 0753  GLUCAP 112* 110* 99 127* 96    Recent Results (from the past 240 hour(s))  URINE CULTURE     Status: Normal   Collection Time   06/12/12  6:31 PM      Component Value Range Status Comment   Specimen Description URINE, CLEAN CATCH   Final    Special Requests NONE   Final    Culture  Setup Time [id number]   Final    Colony Count 25,000 COLONIES/ML   Final    Culture     Final    Value: Multiple bacterial morphotypes present, none predominant. Suggest appropriate recollection if clinically indicated.   Report Status 06/14/2012 FINAL   Final   CULTURE, BLOOD (ROUTINE X 2)     Status: Normal (Preliminary result)   Collection Time   06/12/12  6:35 PM       Component Value Range Status Comment   Specimen Description BLOOD ARM LEFT   Final    Special Requests BOTTLES DRAWN AEROBIC ONLY 8CC   Final    Culture  Setup Time [id number]   Final    Culture     Final    Value:        BLOOD CULTURE RECEIVED NO GROWTH TO DATE CULTURE WILL BE HELD FOR 5 DAYS BEFORE ISSUING A FINAL NEGATIVE REPORT   Report Status PENDING   Incomplete   CULTURE, BLOOD (ROUTINE X 2)     Status: Normal (Preliminary result)   Collection Time   06/12/12  6:45 PM  Component Value Range Status Comment   Specimen Description BLOOD HAND LEFT   Final    Special Requests BOTTLES DRAWN AEROBIC ONLY 5CC   Final    Culture  Setup Time [id number]   Final    Culture     Final    Value:        BLOOD CULTURE RECEIVED NO GROWTH TO DATE CULTURE WILL BE HELD FOR 5 DAYS BEFORE ISSUING A FINAL NEGATIVE REPORT   Report Status PENDING   Incomplete   MRSA PCR SCREENING     Status: Normal   Collection Time   06/12/12 11:26 PM      Component Value Range Status Comment   MRSA by PCR NEGATIVE  NEGATIVE Final      Studies: Dg Chest 1 View  06/12/2012  *RADIOLOGY REPORT*  Clinical Data: 77 year old male Cough, altered mental status.  CHEST - 1 VIEW  Comparison: 06/04/2012 and earlier.  Findings: Stable cardiomegaly and mediastinal contours.  No pneumothorax or pulmonary edema.  No pleural effusion.  Increased streaky opacity at the lung bases.  Multiple chronic left rib fractures.  IMPRESSION: Lower lung volumes with increased basilar atelectasis.  Original Report Authenticated By: Harley Hallmark, M.D.   Dg Chest 2 View  06/04/2012  *RADIOLOGY REPORT*  Clinical Data: Altered mental status.  CHEST - 2 VIEW  Comparison: 04/21/2012  Findings: Cardiomegaly.  Lungs are clear.  No effusions or edema. Multiple old left rib fractures, stable.  Advanced degenerative changes in the shoulders bilaterally.  IMPRESSION: Cardiomegaly.  No acute findings.  Original Report Authenticated By: Cyndie Chime, M.D.    Ct Head Wo Contrast  06/12/2012  *RADIOLOGY REPORT*  Clinical Data: 77 year old male with altered mental status. Hypertension.  Recent fall.  CT HEAD WITHOUT CONTRAST  Technique:  Contiguous axial images were obtained from the base of the skull through the vertex without contrast.  Comparison: 06/07/2012 and earlier.  Findings: Left scalp skin staples in place.  No acute scalp or orbit soft tissue abnormality.  The left tympanic cavity and mastoids remain opacified.  Trace fluid level in the left sphenoid sinus. No acute osseous abnormality identified.  Calcified atherosclerosis at the skull base.  Mega cisterna magna variant again noted. Stable ventricle size and configuration. Dural calcifications. No acute intracranial hemorrhage identified. No evidence of cortically based acute infarction identified.  No suspicious intracranial vascular hyperdensity.  IMPRESSION: Stable brain. No acute intracranial abnormality.  Original Report Authenticated By: Harley Hallmark, M.D.   Ct Head Wo Contrast  06/07/2012  *RADIOLOGY REPORT*  Clinical Data:  Status post unwitnessed fall; laceration to the left forehead.  Assess for cervical spine injury.  CT HEAD WITHOUT CONTRAST AND CT CERVICAL SPINE WITHOUT CONTRAST  Technique:  Multidetector CT imaging of the head and cervical spine was performed following the standard protocol without intravenous contrast.  Multiplanar CT image reconstructions of the cervical spine were also generated.  Comparison: CT of the head performed 05/24/2012, MRI of the brain performed 06/04/2012, and CT of the cervical spine performed 09/06/2007  CT HEAD  Findings: There is no evidence of acute infarction, mass lesion, or intra- or extra-axial hemorrhage on CT.  Prominence of the ventricles and sulci reflects mild cortical volume loss.  Cerebellar atrophy is noted.  Diffuse periventricular and subcortical white matter change likely reflects small vessel ischemic microangiopathy.  The brainstem  and fourth ventricle are within normal limits.  The basal ganglia are unremarkable in appearance.  The cerebral hemispheres demonstrate grossly  normal gray-white differentiation. No mass effect or midline shift is seen.  There is no evidence of fracture; visualized osseous structures are unremarkable in appearance.  The orbits are within normal limits. The paranasal sinuses and mastoid air cells are well-aerated.  Soft tissue swelling is noted overlying the left frontoparietal calvarium.  IMPRESSION:  1.  No evidence of traumatic intracranial injury or fracture. 2.  Soft tissue swelling overlying the left frontoparietal calvarium. 3.  Mild cortical volume loss and diffuse small vessel ischemic microangiopathy.  CT CERVICAL SPINE  Findings: There is a small minimally displaced fracture involving the anterior aspect of the right transverse process of C2, at the superior articular surface. This does not appear to extend into the right transverse foramen, and associated vascular injury is considered unlikely.  This is new from 2008.  No additional fractures are seen.  Vertebral bodies demonstrate normal height and alignment.  There is mild multilevel disc space narrowing along the lower cervical spine, with associated small anterior and posterior disc osteophyte complexes.  Facet disease is noted along the cervical spine.  Prevertebral soft tissues are within normal limits.  The thyroid gland is unremarkable in appearance.  The visualized lung apices are clear.  Mild scattered calcification is noted at the carotid bifurcations bilaterally.  IMPRESSION:  1.  Small minimally displaced fracture involving the anterior aspect of the right transverse process of C2, at the superior articular surface.  This does not appear to extend into the right transverse foramen, and associated vascular injury is considered unlikely. 2.  No additional fractures seen. 3.  Mild degenerative change along the lower cervical spine. 4.  Mild  scattered calcification at the carotid bifurcations bilaterally.  These results were called by telephone on 06/07/2012  at  09:58 p.m. to  [facility] PA, who verbally acknowledged these results.  Original Report Authenticated By: Tonia Ghent, M.D.   Ct Head Wo Contrast  05/24/2012  This examination was performed at [facility] at [address] [facility]. The interpretation will be provided by [facility]  Original Report Authenticated By: Jamesetta Orleans. MATTERN, M.D.   Ct Cervical Spine Wo Contrast  06/07/2012  *RADIOLOGY REPORT*  Clinical Data:  Status post unwitnessed fall; laceration to the left forehead.  Assess for cervical spine injury.  CT HEAD WITHOUT CONTRAST AND CT CERVICAL SPINE WITHOUT CONTRAST  Technique:  Multidetector CT imaging of the head and cervical spine was performed following the standard protocol without intravenous contrast.  Multiplanar CT image reconstructions of the cervical spine were also generated.  Comparison: CT of the head performed 05/24/2012, MRI of the brain performed 06/04/2012, and CT of the cervical spine performed 09/06/2007  CT HEAD  Findings: There is no evidence of acute infarction, mass lesion, or intra- or extra-axial hemorrhage on CT.  Prominence of the ventricles and sulci reflects mild cortical volume loss.  Cerebellar atrophy is noted.  Diffuse periventricular and subcortical white matter change likely reflects small vessel ischemic microangiopathy.  The brainstem and fourth ventricle are within normal limits.  The basal ganglia are unremarkable in appearance.  The cerebral hemispheres demonstrate grossly normal gray-white differentiation. No mass effect or midline shift is seen.  There is no evidence of fracture; visualized osseous structures are unremarkable in appearance.  The orbits are within normal limits. The paranasal sinuses and mastoid air cells are well-aerated.  Soft tissue swelling is noted overlying the left  frontoparietal calvarium.  IMPRESSION:  1.  No evidence of traumatic intracranial injury or fracture.  2.  Soft tissue swelling overlying the left frontoparietal calvarium. 3.  Mild cortical volume loss and diffuse small vessel ischemic microangiopathy.  CT CERVICAL SPINE  Findings: There is a small minimally displaced fracture involving the anterior aspect of the right transverse process of C2, at the superior articular surface. This does not appear to extend into the right transverse foramen, and associated vascular injury is considered unlikely.  This is new from 2008.  No additional fractures are seen.  Vertebral bodies demonstrate normal height and alignment.  There is mild multilevel disc space narrowing along the lower cervical spine, with associated small anterior and posterior disc osteophyte complexes.  Facet disease is noted along the cervical spine.  Prevertebral soft tissues are within normal limits.  The thyroid gland is unremarkable in appearance.  The visualized lung apices are clear.  Mild scattered calcification is noted at the carotid bifurcations bilaterally.  IMPRESSION:  1.  Small minimally displaced fracture involving the anterior aspect of the right transverse process of C2, at the superior articular surface.  This does not appear to extend into the right transverse foramen, and associated vascular injury is considered unlikely. 2.  No additional fractures seen. 3.  Mild degenerative change along the lower cervical spine. 4.  Mild scattered calcification at the carotid bifurcations bilaterally.  These results were called by telephone on 06/07/2012  at  09:58 p.m. to  [facility], who verbally acknowledged these results.  Original Report Authenticated By: Tonia Ghent, M.D.   Mr Brain Wo Contrast  06/04/2012  *RADIOLOGY REPORT*  Clinical Data: Slurred speech.  Altered mental status.  MRI HEAD WITHOUT CONTRAST  Technique:  Multiplanar, multiecho pulse sequences of the brain and  surrounding structures were obtained according to standard protocol without intravenous contrast.  Comparison: 05/24/2012 CT.  No comparison MR.  Findings: Motion degraded exam.  No acute infarct (artifact left parietal lobe series 4 image 25).  No intracranial hemorrhage.  Global atrophy.  Ventricular prominence may be related to atrophy although difficult to completely exclude a mild component of hydrocephalus.  Mega cisterna magna.  Mild small vessel disease type changes.  No intracranial mass lesion detected on this unenhanced exam.  Major intracranial vascular structures are patent. Prominent ectasia of the right internal carotid artery supraclinoid segment with superior displacement of the ectatic right anterior cerebral artery causing compression of the undersurface of the ventricle and the right optic nerve with mild deformity of the optic chiasm.  Paranasal sinus mild mucosal thickening with polypoid appearance inferior right maxillary sinus.  Bilateral mastoid air cell opacification without mass seen in the region of the posterior- superior nasopharynx causing eustachian tube dysfunction.  IMPRESSION: Motion degraded exam.  No acute infarct.  No intracranial hemorrhage.  Global atrophy.  Ventricular prominence may be related to atrophy although difficult to completely exclude a mild component of hydrocephalus.  Mega cisterna magna.  Mild small vessel disease type changes.  Prominent ectasia of the right internal carotid artery supraclinoid segment with superior displacement of the ectatic right anterior cerebral artery causing compression of the undersurface of the ventricle and the right optic nerve with mild deformity of the optic chiasm.  Paranasal sinus mild mucosal thickening with polypoid appearance inferior right maxillary sinus.  Bilateral mastoid air cell opacification.  Original Report Authenticated By: Fuller Canada, M.D.   Dg Shoulder Left  06/13/2012  *RADIOLOGY REPORT*  Clinical Data:  Fall.  Will not move left arm.  Left shoulder pain.  LEFT SHOULDER - 2+  VIEW  Comparison: None.  Findings: No fracture or dislocation.  Advanced degenerative changes in the glenohumeral joint with loss of joint space, subchondral sclerosis and osteophytosis.  Degenerative changes are also seen in the left acromioclavicular joint, with slight elevation of the distal left clavicle, of unknown chronicity. The latter is seen on only one view.  Rounded ossific densities are seen inferior to the acromioclavicular joint.  Visualized portion of the left chest shows multiple old left rib fractures.  IMPRESSION:  1.  No acute findings. 2.  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 3.  Acromioclavicular joint osteoarthritis.  Elevation of the distal left clavicle with respect to the acromion on one view may indicate a strain injury, age indeterminate. 4.  Osseous densities inferior to the acromioclavicular joint may represent loose bodies.  Original Report Authenticated By: Reyes Ivan, M.D.   Dg Hand Complete Left  06/07/2012  *RADIOLOGY REPORT*  Clinical Data: Fall, abrasions.  LEFT HAND - COMPLETE 3+ VIEW  Comparison: None.  Findings: Moderate degenerative changes throughout the IP joints, MCP joints and first carpal metacarpal joint. No acute bony abnormality.  Specifically, no fracture, subluxation, or dislocation.  Soft tissues are intact.  IMPRESSION: No acute bony abnormality.  Original Report Authenticated By: Cyndie Chime, M.D.   Dg Fluoro Guide Lumbar Puncture  05/31/2012  *RADIOLOGY REPORT*  Clinical Data:  Confusion.  Gait disturbance.  Parkinson's disease.  DIAGNOSTIC LUMBAR PUNCTURE UNDER FLUOROSCOPIC GUIDANCE  Fluoroscopy time:  23 seconds .  Technique:  Informed consent was obtained from the patient prior to the procedure, including potential complications of headache, allergy, and pain.   With the patient prone, the lower back was prepped with Betadine.  1% Lidocaine was used for  local anesthesia. Lumbar puncture was performed at the right L4-5 level using a 20 gauge needle with return of slightly turbid CSF with an opening pressure of approximately 10 cm water. The patient was too debilitated tube rolled to the side.  The CSF pressure is quite low, barely reaching the have of the needle.  With raising the head, some spontaneous flow occurred.  I wound at the having to gently aspirated using a syringe to require a large volume requested.  25  ml of CSF were obtained for laboratory studies. The patient tolerated the procedure well and there were no apparent complications.  IMPRESSION: 25 ml of CSF obtained for requested studies.  Opening pressure was quite low, 10 centimeters of water or less.  See above discussion.  Original Report Authenticated By: Thomasenia Sales, M.D.    Scheduled Meds:    . amLODipine  5 mg Oral Daily  . carbidopa-levodopa  1 tablet Oral TID  . enoxaparin (LOVENOX) injection  40 mg Subcutaneous Daily  . entacapone  200 mg Oral TID  . insulin aspart  0-9 Units Subcutaneous TID WC  . lamoTRIgine  150 mg Oral BID  . lithium carbonate  150 mg Oral BID WC  . pantoprazole  40 mg Oral Q1200  . ramipril  5 mg Oral BID  . sertraline  100 mg Oral Daily  . vancomycin  750 mg Intravenous Q12H  . DISCONTD: vancomycin  1,000 mg Intravenous Q12H   Continuous Infusions:    . sodium chloride 50 mL/hr at 06/18/12 0500

## 2012-06-18 NOTE — Progress Notes (Signed)
Patient ID: [name], male   DOB: 12/07/1935, 77 y.o.   MRN: [id number]    [facility]    Date of Admission:  06/12/2012           Day 14 total antibiotics        Day 7 vancomycin Active Problems:  Diabetes mellitus  Bipolar 1 disorder  Parkinson disease  Encephalopathy  Metabolic encephalopathy  Urinary tract infection  Bacteremia of undetermined etiology      . amLODipine  5 mg Oral Daily  . carbidopa-levodopa  1 tablet Oral TID  . enoxaparin (LOVENOX) injection  40 mg Subcutaneous Daily  . entacapone  200 mg Oral TID  . insulin aspart  0-9 Units Subcutaneous TID WC  . lamoTRIgine  150 mg Oral BID  . lithium carbonate  150 mg Oral BID WC  . pantoprazole  40 mg Oral Q1200  . ramipril  5 mg Oral BID  . sertraline  100 mg Oral Daily  . vancomycin  750 mg Intravenous Q12H  . DISCONTD: vancomycin  1,000 mg Intravenous Q12H    Subjective: He is feeling better  Objective: Temp:  [98.1 F (36.7 C)-98.4 F (36.9 C)] 98.1 F (36.7 C) (07/02 1300) Pulse Rate:  [51-57] 55  (07/02 1300) Resp:  [20] 20  (07/02 1300) BP: (106-147)/(67-80) 106/67 mmHg (07/02 1300) SpO2:  [97 %-99 %] 99 % (07/02 1300) Weight:  [62 kg (136 lb 11 oz)] 62 kg (136 lb 11 oz) (07/02 0708)  General: He is comfortable sitting up in a chair with his neck brace on Skin: No rash or splinter hemorrhages Lungs: Clear Cor: Regular S1-S2 no murmurs Abdomen: Nontender   Lab Results Lab Results  Component Value Date   WBC 8.6 06/15/2012   HGB 12.1* 06/15/2012   HCT 36.1* 06/15/2012   MCV 90.7 06/15/2012   PLT 294 06/15/2012    Lab Results  Component Value Date   CREATININE 0.84 06/16/2012   BUN 15 06/16/2012   NA 140 06/16/2012   K 4.2 06/16/2012   CL 110 06/16/2012   CO2 22 06/16/2012    Lab Results  Component Value Date   ALT <5 06/12/2012   AST 10 06/12/2012   ALKPHOS 77 06/12/2012   BILITOT 0.3 06/12/2012      Microbiology: Recent Results (from the past 240  hour(s))  URINE CULTURE     Status: Normal   Collection Time   06/12/12  6:31 PM      Component Value Range Status Comment   Specimen Description URINE, CLEAN CATCH   Final    Special Requests NONE   Final    Culture  Setup Time [id number]   Final    Colony Count 25,000 COLONIES/ML   Final    Culture     Final    Value: Multiple bacterial morphotypes present, none predominant. Suggest appropriate recollection if clinically indicated.   Report Status 06/14/2012 FINAL   Final   CULTURE, BLOOD (ROUTINE X 2)     Status: Normal (Preliminary result)   Collection Time   06/12/12  6:35 PM      Component Value Range Status Comment   Specimen Description BLOOD ARM LEFT   Final    Special Requests BOTTLES DRAWN [facility]EROBIC ONLY [facility]   Final    Culture  Setup Time [id number]   Final    Culture     Final    Value:        BLOOD  CULTURE RECEIVED NO GROWTH TO DATE CULTURE WILL BE HELD FOR 5 DAYS BEFORE ISSUING A FINAL NEGATIVE REPORT   Report Status PENDING   Incomplete   CULTURE, BLOOD (ROUTINE X 2)     Status: Normal (Preliminary result)   Collection Time   06/12/12  6:45 PM      Component Value Range Status Comment   Specimen Description BLOOD HAND LEFT   Final    Special Requests BOTTLES DRAWN AEROBIC ONLY 5CC   Final    Culture  Setup Time [id number]   Final    Culture     Final    Value:        BLOOD CULTURE RECEIVED NO GROWTH TO DATE CULTURE WILL BE HELD FOR 5 DAYS BEFORE ISSUING A FINAL NEGATIVE REPORT   Report Status PENDING   Incomplete   MRSA PCR SCREENING     Status: Normal   Collection Time   06/12/12 11:26 PM      Component Value Range Status Comment   MRSA by PCR NEGATIVE  NEGATIVE Final    Assessment: He has improved following vancomycin therapy. I agree with the plan to continue vancomycin for 3 more weeks to treat for possible complicated MRSA UTI.  Plan: 1. Continue vancomycin 3 more weeks 2. Please call if we can be of further assistance while here  Cliffton Asters,  MD [facility] [phone] pager   [phone] cell 06/18/2012, 2:56 PM

## 2012-06-18 NOTE — Discharge Summary (Addendum)
Physician Discharge Summary  [name] ZOX:[id number] DOB: Oct 22, 1935 DOA: 06/12/2012  PCP: [name], MD  Admit date: 06/12/2012 Discharge date: 06/18/2012  Discharge Diagnoses:  Active Problems:  Diabetes mellitus  Bipolar 1 disorder  Parkinson disease  Encephalopathy  Metabolic encephalopathy  Urinary tract infection  Bacteremia of undetermined etiology Neurogenic bladder  Discharge Condition: stable for d/c  Disposition:  Follow-up Information    Follow up with [name], MD in 2 weeks. (hospital follow up)    Contact information:   [address] [place] [address] [phone]       Follow up with NESI,MARC-HENRY, MD. (follow up in 4 weeks.)    Contact information:   [address] [place], [address] [facility] [place] [address] [phone]          Diet:regular diet  History of present illness:  77 year-old male with history of hypertension, Parkinson disease, diabetes mellitus type 2, chronic indwelling Foley's catheter was brought from the nursing home because of patient was found to be increasingly confused. Patient also was having cough. In the ER patient was initially found to be hypotensive and a 1 L bolus was given. After which his blood pressure improved. He has leukocytosis but at this time he is afebrile and chest x-ray shows atelectasis. Patient will be admitted for further management. Patient is oriented to his name only. He does have slurred speech which has per nursing home is chronic. And he has indwelling Foley's catheter and the reason not known. Of note patient was brought to the ER 3-4 days ago with a fall and had sustained left scalp laceration which was sutured and CT head showed C2 fracture. Patient was at that time discharged on cervical collar to follow with neurosurgeon in 2 weeks. Week and half ago patient also was admitted to the hospital at that time patient  had lumbar puncture done which was unremarkable and also was found to have urinary tract infection and grew staph aureus. Patient was at that time sent home on ceftriaxone. Presently patient denies any chest pain and is not short of breath. Has not had any nausea or vomiting or diarrhea.   Hospital Course:  Bacteremia of undetermined etiology: -Patient blood cultures were drawn initially which were negative. He had been on antibiotics previously which is probably contributing blood cultures were negative. He did have a MRSA UTI confirm back on his last admission. ID was consulted and they were concerned for partially treated disseminated MRSA blood cultures were drawn this admission which remained negative. He has indwelling catheter. A 2-D echo was done which is pending at the time of this dictation to look for vegetation.Given his recent c2 fx, he will not be a candidate for TEE, would likely presumptively treat as a complicated MRSA bacteremia for 3 wks.   Diabetes mellitus: -In remission he initially hypoglycemic event this was treated with oral glucose. He was continue sliding scale and no further events. No changes were made he would continue his home dose to   Bipolar 1 disorder: -Gingerly continue home meds.   Metabolic encephalopathy: -Initially when he was admitted he was significantly confused. His lithium level was high after hydration. He continued to be confused. The most likely cause of his metabolic encephalopathy as deemed to be his partially treated bacteremia. A urine and once this was done and urine cultures that were negative. Chest x-ray was done which was negative. After treatment with bank for the first  3 days his encephalopathy resolved. Please see partially treated bacteremia further details.  MRSA Urinary tract infection: We'll continue antibiotics for 3 weeks. There is no documented blood cultures showed MRSA. ID was consulted which they agree to treat empirically for  partially treated MRSA bacteremia. Keep foley has neurogenic bladder. Needs to follow up with urologist 4 weeks.     Discharge Exam: Filed Vitals:   06/18/12 0939  BP: 134/75  Pulse:   Temp:   Resp:    Filed Vitals:   06/17/12 1326 06/17/12 2033 06/18/12 0708 06/18/12 0939  BP: 101/63 121/69 147/80 134/75  Pulse: 57 57 51   Temp: 98.3 F (36.8 C) 98.2 F (36.8 C) 98.4 F (36.9 C)   TempSrc: Oral Oral Oral   Resp: 18 20 20    Height:      Weight:   62 kg (136 lb 11 oz)   SpO2: 96% 99% 97%    General: Alert and oriented x1 Cardiovascular: Regular rate and rhythm with positive S1 and S2 Respiratory: Good air movement clear to auscultation  Discharge Instructions  Discharge Orders    Future Orders Please Complete By Expires   Diet - low sodium heart healthy      Increase activity slowly        Medication List  As of 06/18/2012 11:41 AM   STOP taking these medications         sulfamethoxazole-trimethoprim 800-160 MG per tablet         TAKE these medications         amLODipine 5 MG tablet   Commonly known as: NORVASC   Take 5 mg by mouth daily.      aspirin 81 MG tablet   Take 81 mg by mouth daily.      carbidopa-levodopa 25-250 MG per tablet   Commonly known as: SINEMET IR   Take 1 tablet by mouth 3 (three) times daily.      entacapone 200 MG tablet   Commonly known as: COMTAN   Take 200 mg by mouth 3 (three) times daily.      hydrochlorothiazide 25 MG tablet   Commonly known as: HYDRODIURIL   Take 25 mg by mouth daily. HOLD FOR SBP<110      lamoTRIgine 150 MG tablet   Commonly known as: LAMICTAL   Take 150 mg by mouth 2 (two) times daily.      lithium carbonate 300 MG capsule   Take 300 mg by mouth 2 (two) times daily with a meal.      LORazepam 0.5 MG tablet   Commonly known as: ATIVAN   Take 0.5 mg by mouth at bedtime.      metFORMIN 500 MG tablet   Commonly known as: GLUCOPHAGE   Take 500 mg by mouth 2 (two) times daily with a meal.       multivitamins ther. w/minerals Tabs   Take 1 tablet by mouth daily.      omeprazole 20 MG capsule   Commonly known as: PRILOSEC   Take 20 mg by mouth every morning.      potassium chloride SA 20 MEQ tablet   Commonly known as: K-DUR,KLOR-CON   Take 20 mEq by mouth daily.      ramipril 5 MG tablet   Commonly known as: ALTACE   Take 5 mg by mouth 2 (two) times daily. HOLD FOR SBP <110      RED YEAST RICE PO   Take 2 tablets by mouth at bedtime.  sertraline 100 MG tablet   Commonly known as: ZOLOFT   Take 100 mg by mouth daily.      sodium chloride 0.9 % SOLN 150 mL with vancomycin 1000 MG SOLR 750 mg   Inject 750 mg into the vein every 12 (twelve) hours.      temazepam 30 MG capsule   Commonly known as: RESTORIL   Take 30 mg by mouth at bedtime as needed. FOR INSOMNIA              The results of significant diagnostics from this hospitalization (including imaging, microbiology, ancillary and laboratory) are listed below for reference.    Significant Diagnostic Studies: Dg Chest 1 View  06/12/2012  *RADIOLOGY REPORT*  Clinical Data: 77 year old male Cough, altered mental status.  CHEST - 1 VIEW  Comparison: 06/04/2012 and earlier.  Findings: Stable cardiomegaly and mediastinal contours.  No pneumothorax or pulmonary edema.  No pleural effusion.  Increased streaky opacity at the lung bases.  Multiple chronic left rib fractures.  IMPRESSION: Lower lung volumes with increased basilar atelectasis.  Original Report Authenticated By: Harley Hallmark, M.D.   Dg Chest 2 View  06/04/2012  *RADIOLOGY REPORT*  Clinical Data: Altered mental status.  CHEST - 2 VIEW  Comparison: 04/21/2012  Findings: Cardiomegaly.  Lungs are clear.  No effusions or edema. Multiple old left rib fractures, stable.  Advanced degenerative changes in the shoulders bilaterally.  IMPRESSION: Cardiomegaly.  No acute findings.  Original Report Authenticated By: Cyndie Chime, M.D.   Ct Head Wo  Contrast  06/12/2012  *RADIOLOGY REPORT*  Clinical Data: 77 year old male with altered mental status. Hypertension.  Recent fall.  CT HEAD WITHOUT CONTRAST  Technique:  Contiguous axial images were obtained from the base of the skull through the vertex without contrast.  Comparison: 06/07/2012 and earlier.  Findings: Left scalp skin staples in place.  No acute scalp or orbit soft tissue abnormality.  The left tympanic cavity and mastoids remain opacified.  Trace fluid level in the left sphenoid sinus. No acute osseous abnormality identified.  Calcified atherosclerosis at the skull base.  Mega cisterna magna variant again noted. Stable ventricle size and configuration. Dural calcifications. No acute intracranial hemorrhage identified. No evidence of cortically based acute infarction identified.  No suspicious intracranial vascular hyperdensity.  IMPRESSION: Stable brain. No acute intracranial abnormality.  Original Report Authenticated By: Harley Hallmark, M.D.   Ct Head Wo Contrast  06/07/2012  *RADIOLOGY REPORT*  Clinical Data:  Status post unwitnessed fall; laceration to the left forehead.  Assess for cervical spine injury.  CT HEAD WITHOUT CONTRAST AND CT CERVICAL SPINE WITHOUT CONTRAST  Technique:  Multidetector CT imaging of the head and cervical spine was performed following the standard protocol without intravenous contrast.  Multiplanar CT image reconstructions of the cervical spine were also generated.  Comparison: CT of the head performed 05/24/2012, MRI of the brain performed 06/04/2012, and CT of the cervical spine performed 09/06/2007  CT HEAD  Findings: There is no evidence of acute infarction, mass lesion, or intra- or extra-axial hemorrhage on CT.  Prominence of the ventricles and sulci reflects mild cortical volume loss.  Cerebellar atrophy is noted.  Diffuse periventricular and subcortical white matter change likely reflects small vessel ischemic microangiopathy.  The brainstem and fourth  ventricle are within normal limits.  The basal ganglia are unremarkable in appearance.  The cerebral hemispheres demonstrate grossly normal gray-white differentiation. No mass effect or midline shift is seen.  There is no evidence of  fracture; visualized osseous structures are unremarkable in appearance.  The orbits are within normal limits. The paranasal sinuses and mastoid air cells are well-aerated.  Soft tissue swelling is noted overlying the left frontoparietal calvarium.  IMPRESSION:  1.  No evidence of traumatic intracranial injury or fracture. 2.  Soft tissue swelling overlying the left frontoparietal calvarium. 3.  Mild cortical volume loss and diffuse small vessel ischemic microangiopathy.  CT CERVICAL SPINE  Findings: There is a small minimally displaced fracture involving the anterior aspect of the right transverse process of C2, at the superior articular surface. This does not appear to extend into the right transverse foramen, and associated vascular injury is considered unlikely.  This is new from 2008.  No additional fractures are seen.  Vertebral bodies demonstrate normal height and alignment.  There is mild multilevel disc space narrowing along the lower cervical spine, with associated small anterior and posterior disc osteophyte complexes.  Facet disease is noted along the cervical spine.  Prevertebral soft tissues are within normal limits.  The thyroid gland is unremarkable in appearance.  The visualized lung apices are clear.  Mild scattered calcification is noted at the carotid bifurcations bilaterally.  IMPRESSION:  1.  Small minimally displaced fracture involving the anterior aspect of the right transverse process of C2, at the superior articular surface.  This does not appear to extend into the right transverse foramen, and associated vascular injury is considered unlikely. 2.  No additional fractures seen. 3.  Mild degenerative change along the lower cervical spine. 4.  Mild scattered  calcification at the carotid bifurcations bilaterally.  These results were called by telephone on 06/07/2012  at  09:58 p.m. to  [facility] PA, who verbally acknowledged these results.  Original Report Authenticated By: Tonia Ghent, M.D.   Ct Head Wo Contrast  05/24/2012  This examination was performed at [facility] at [address] [facility]. The interpretation will be provided by [facility]  Original Report Authenticated By: Jamesetta Orleans. MATTERN, M.D.   Ct Cervical Spine Wo Contrast  06/07/2012  *RADIOLOGY REPORT*  Clinical Data:  Status post unwitnessed fall; laceration to the left forehead.  Assess for cervical spine injury.  CT HEAD WITHOUT CONTRAST AND CT CERVICAL SPINE WITHOUT CONTRAST  Technique:  Multidetector CT imaging of the head and cervical spine was performed following the standard protocol without intravenous contrast.  Multiplanar CT image reconstructions of the cervical spine were also generated.  Comparison: CT of the head performed 05/24/2012, MRI of the brain performed 06/04/2012, and CT of the cervical spine performed 09/06/2007  CT HEAD  Findings: There is no evidence of acute infarction, mass lesion, or intra- or extra-axial hemorrhage on CT.  Prominence of the ventricles and sulci reflects mild cortical volume loss.  Cerebellar atrophy is noted.  Diffuse periventricular and subcortical white matter change likely reflects small vessel ischemic microangiopathy.  The brainstem and fourth ventricle are within normal limits.  The basal ganglia are unremarkable in appearance.  The cerebral hemispheres demonstrate grossly normal gray-white differentiation. No mass effect or midline shift is seen.  There is no evidence of fracture; visualized osseous structures are unremarkable in appearance.  The orbits are within normal limits. The paranasal sinuses and mastoid air cells are well-aerated.  Soft tissue swelling is noted overlying the left frontoparietal  calvarium.  IMPRESSION:  1.  No evidence of traumatic intracranial injury or fracture. 2.  Soft tissue swelling overlying the left frontoparietal calvarium. 3.  Mild cortical volume loss and  diffuse small vessel ischemic microangiopathy.  CT CERVICAL SPINE  Findings: There is a small minimally displaced fracture involving the anterior aspect of the right transverse process of C2, at the superior articular surface. This does not appear to extend into the right transverse foramen, and associated vascular injury is considered unlikely.  This is new from 2008.  No additional fractures are seen.  Vertebral bodies demonstrate normal height and alignment.  There is mild multilevel disc space narrowing along the lower cervical spine, with associated small anterior and posterior disc osteophyte complexes.  Facet disease is noted along the cervical spine.  Prevertebral soft tissues are within normal limits.  The thyroid gland is unremarkable in appearance.  The visualized lung apices are clear.  Mild scattered calcification is noted at the carotid bifurcations bilaterally.  IMPRESSION:  1.  Small minimally displaced fracture involving the anterior aspect of the right transverse process of C2, at the superior articular surface.  This does not appear to extend into the right transverse foramen, and associated vascular injury is considered unlikely. 2.  No additional fractures seen. 3.  Mild degenerative change along the lower cervical spine. 4.  Mild scattered calcification at the carotid bifurcations bilaterally.  These results were called by telephone on 06/07/2012  at  09:58 p.m. to  [facility] PA, who verbally acknowledged these results.  Original Report Authenticated By: Tonia Ghent, M.D.   Mr Brain Wo Contrast  06/04/2012  *RADIOLOGY REPORT*  Clinical Data: Slurred speech.  Altered mental status.  MRI HEAD WITHOUT CONTRAST  Technique:  Multiplanar, multiecho pulse sequences of the brain and surrounding structures  were obtained according to standard protocol without intravenous contrast.  Comparison: 05/24/2012 CT.  No comparison MR.  Findings: Motion degraded exam.  No acute infarct (artifact left parietal lobe series 4 image 25).  No intracranial hemorrhage.  Global atrophy.  Ventricular prominence may be related to atrophy although difficult to completely exclude a mild component of hydrocephalus.  Mega cisterna magna.  Mild small vessel disease type changes.  No intracranial mass lesion detected on this unenhanced exam.  Major intracranial vascular structures are patent. Prominent ectasia of the right internal carotid artery supraclinoid segment with superior displacement of the ectatic right anterior cerebral artery causing compression of the undersurface of the ventricle and the right optic nerve with mild deformity of the optic chiasm.  Paranasal sinus mild mucosal thickening with polypoid appearance inferior right maxillary sinus.  Bilateral mastoid air cell opacification without mass seen in the region of the posterior- superior nasopharynx causing eustachian tube dysfunction.  IMPRESSION: Motion degraded exam.  No acute infarct.  No intracranial hemorrhage.  Global atrophy.  Ventricular prominence may be related to atrophy although difficult to completely exclude a mild component of hydrocephalus.  Mega cisterna magna.  Mild small vessel disease type changes.  Prominent ectasia of the right internal carotid artery supraclinoid segment with superior displacement of the ectatic right anterior cerebral artery causing compression of the undersurface of the ventricle and the right optic nerve with mild deformity of the optic chiasm.  Paranasal sinus mild mucosal thickening with polypoid appearance inferior right maxillary sinus.  Bilateral mastoid air cell opacification.  Original Report Authenticated By: Fuller Canada, M.D.   Dg Shoulder Left  06/13/2012  *RADIOLOGY REPORT*  Clinical Data: Fall.  Will not move left  arm.  Left shoulder pain.  LEFT SHOULDER - 2+ VIEW  Comparison: None.  Findings: No fracture or dislocation.  Advanced degenerative changes in the glenohumeral  joint with loss of joint space, subchondral sclerosis and osteophytosis.  Degenerative changes are also seen in the left acromioclavicular joint, with slight elevation of the distal left clavicle, of unknown chronicity. The latter is seen on only one view.  Rounded ossific densities are seen inferior to the acromioclavicular joint.  Visualized portion of the left chest shows multiple old left rib fractures.  IMPRESSION:  1.  No acute findings. 2.  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 3.  Acromioclavicular joint osteoarthritis.  Elevation of the distal left clavicle with respect to the acromion on one view may indicate a strain injury, age indeterminate. 4.  Osseous densities inferior to the acromioclavicular joint may represent loose bodies.  Original Report Authenticated By: Reyes Ivan, M.D.   Dg Hand Complete Left  06/07/2012  *RADIOLOGY REPORT*  Clinical Data: Fall, abrasions.  LEFT HAND - COMPLETE 3+ VIEW  Comparison: None.  Findings: Moderate degenerative changes throughout the IP joints, MCP joints and first carpal metacarpal joint. No acute bony abnormality.  Specifically, no fracture, subluxation, or dislocation.  Soft tissues are intact.  IMPRESSION: No acute bony abnormality.  Original Report Authenticated By: Cyndie Chime, M.D.   Dg Fluoro Guide Lumbar Puncture  05/31/2012  *RADIOLOGY REPORT*  Clinical Data:  Confusion.  Gait disturbance.  Parkinson's disease.  DIAGNOSTIC LUMBAR PUNCTURE UNDER FLUOROSCOPIC GUIDANCE  Fluoroscopy time:  23 seconds .  Technique:  Informed consent was obtained from the patient prior to the procedure, including potential complications of headache, allergy, and pain.   With the patient prone, the lower back was prepped with Betadine.  1% Lidocaine was used for local anesthesia. Lumbar  puncture was performed at the right L4-5 level using a 20 gauge needle with return of slightly turbid CSF with an opening pressure of approximately 10 cm water. The patient was too debilitated tube rolled to the side.  The CSF pressure is quite low, barely reaching the have of the needle.  With raising the head, some spontaneous flow occurred.  I wound at the having to gently aspirated using a syringe to require a large volume requested.  25  ml of CSF were obtained for laboratory studies. The patient tolerated the procedure well and there were no apparent complications.  IMPRESSION: 25 ml of CSF obtained for requested studies.  Opening pressure was quite low, 10 centimeters of water or less.  See above discussion.  Original Report Authenticated By: Thomasenia Sales, M.D.    Microbiology: Recent Results (from the past 240 hour(s))  URINE CULTURE     Status: Normal   Collection Time   06/12/12  6:31 PM      Component Value Range Status Comment   Specimen Description URINE, CLEAN CATCH   Final    Special Requests NONE   Final    Culture  Setup Time [id number]   Final    Colony Count 25,000 COLONIES/ML   Final    Culture     Final    Value: Multiple bacterial morphotypes present, none predominant. Suggest appropriate recollection if clinically indicated.   Report Status 06/14/2012 FINAL   Final   CULTURE, BLOOD (ROUTINE X 2)     Status: Normal (Preliminary result)   Collection Time   06/12/12  6:35 PM      Component Value Range Status Comment   Specimen Description BLOOD ARM LEFT   Final    Special Requests BOTTLES DRAWN AEROBIC ONLY 8CC   Final    Culture  Setup  Time [id number]   Final    Culture     Final    Value:        BLOOD CULTURE RECEIVED NO GROWTH TO DATE CULTURE WILL BE HELD FOR 5 DAYS BEFORE ISSUING A FINAL NEGATIVE REPORT   Report Status PENDING   Incomplete   CULTURE, BLOOD (ROUTINE X 2)     Status: Normal (Preliminary result)   Collection Time   06/12/12  6:45 PM      Component  Value Range Status Comment   Specimen Description BLOOD HAND LEFT   Final    Special Requests BOTTLES DRAWN AEROBIC ONLY 5CC   Final    Culture  Setup Time [id number]   Final    Culture     Final    Value:        BLOOD CULTURE RECEIVED NO GROWTH TO DATE CULTURE WILL BE HELD FOR 5 DAYS BEFORE ISSUING A FINAL NEGATIVE REPORT   Report Status PENDING   Incomplete   MRSA PCR SCREENING     Status: Normal   Collection Time   06/12/12 11:26 PM      Component Value Range Status Comment   MRSA by PCR NEGATIVE  NEGATIVE Final      Labs: Basic Metabolic Panel:  Lab 06/16/12 2130 06/15/12 0330 06/13/12 0004 06/12/12 1830  NA 140 141 -- 136  K 4.2 4.1 -- --  CL 110 111 -- 105  CO2 22 20 -- 21  GLUCOSE 108* 116* -- 99  BUN 15 11 -- 49*  CREATININE 0.84 0.85 1.59* 1.79*  CALCIUM 9.0 9.0 -- 9.2  MG -- -- -- --  PHOS -- -- -- --   Liver Function Tests:  Lab 06/12/12 1830  AST 10  ALT <5  ALKPHOS 77  BILITOT 0.3  PROT 5.3*  ALBUMIN 3.3*   No results found for this basename: LIPASE:5,AMYLASE:5 in the last 168 hours No results found for this basename: AMMONIA:5 in the last 168 hours CBC:  Lab 06/15/12 0330 06/13/12 0004 06/12/12 1830  WBC 8.6 13.8* 16.1*  NEUTROABS -- -- 14.7*  HGB 12.1* 12.2* 12.5*  HCT 36.1* 37.1* 37.4*  MCV 90.7 92.8 91.9  PLT 294 307 299   Cardiac Enzymes:  Lab 06/12/12 2349  CKTOTAL 22  CKMB 1.9  CKMBINDEX --  TROPONINI <0.30   BNP: No components found with this basename: POCBNP:5 CBG:  Lab 06/18/12 0802 06/17/12 2229 06/17/12 1713 06/17/12 1140 06/17/12 0753  GLUCAP 112* 110* 99 127* 96    Time coordinating discharge: Greater than 45 minutes  Signed:  Marinda Elk  [facility] 06/18/2012, 11:41 AM

## 2012-06-18 NOTE — Progress Notes (Signed)
Occupational Therapy Treatment Patient Details Name: [name] MRN: [id number] DOB: 07/14/1935 Today's Date: 06/18/2012 Time: 4098-1191 OT Time Calculation (min): 13 min  OT Assessment / Plan / Recommendation Comments on Treatment Session Pt. progressing well today and anxious to leave hospital    Follow Up Recommendations  Skilled nursing facility       Equipment Recommendations  Defer to next venue       Frequency Min 2X/week   Plan Discharge plan remains appropriate    Precautions / Restrictions Precautions Precautions: Fall;Cervical Required Braces or Orthoses: Cervical Brace Cervical Brace: Hard collar Restrictions Weight Bearing Restrictions: No       ADL  Grooming: Performed;Wash/dry face;Set up Where Assessed - Grooming: Unsupported sitting Toilet Transfer: Simulated;+2 Total assistance Toilet Transfer: Patient Percentage: 70% Toilet Transfer Method: Stand pivot Acupuncturist: Other (comment) (recliner) Equipment Used:  (cervical collar) Transfers/Ambulation Related to ADLs: Max verbal cues for hand placement and technique due to posterior lean ADL Comments: Pt. requiring frequent cuing and redirection for hand placement during transfer to chair and manual facilitation provided to facilitate anteror weight shift and bil knees blocked due to intermittent buckling       OT Goals Acute Rehab OT Goals OT Goal Formulation: With patient Time For Goal Achievement: 06/30/12 Potential to Achieve Goals: Good ADL Goals Pt Will Perform Grooming: with supervision;Standing at sink ADL Goal: Grooming - Progress: Progressing toward goals Pt Will Transfer to Toilet: Ambulation;with DME;Comfort height toilet;with min assist ADL Goal: Toilet Transfer - Progress: Progressing toward goals Miscellaneous OT Goals Miscellaneous OT Goal #1: Pt will perform static sitting balance task EOB ~5 min with supervision as precursor for functional transfers. OT Goal:  Miscellaneous Goal #1 - Progress: Progressing toward goals  Visit Information  Last OT Received On: 06/18/12 Assistance Needed: +2          Cognition  Overall Cognitive Status: Impaired Area of Impairment: Awareness of errors;Awareness of deficits Arousal/Alertness: Awake/alert Behavior During Session: [facility] for tasks performed Current Attention Level: Sustained Following Commands: Follows one step commands consistently Safety/Judgement: Decreased awareness of need for assistance Safety/Judgement - Other Comments: Pt. unaware of decreased balance Awareness of Errors: Assistance required to correct errors made;Assistance required to identify errors made    Mobility Bed Mobility Bed Mobility: Right Sidelying to Sit;Rolling Right Rolling Right: 3: Mod assist Right Sidelying to Sit: 3: Mod assist;HOB flat Details for Bed Mobility Assistance: Mod verbal cues to facilitate log rolling due to cervical precautions. Pt. attempting to complete supine to sit. Transfers Transfers: Sit to Stand;Stand to Sit Sit to Stand: 1: +2 Total assist;With upper extremity assist;From bed Sit to Stand: Patient Percentage: 70% Stand to Sit: 1: +2 Total assist;To chair/3-in-1 Stand to Sit: Patient Percentage: 60% Details for Transfer Assistance: Max verbal cues for hand placement and technique      Balance Balance Balance Assessed: Yes Dynamic Sitting Balance Dynamic Sitting - Balance Support: Bilateral upper extremity supported Dynamic Sitting - Level of Assistance: 3: Mod assist Dynamic Sitting - Comments: Manual facilitation for anterior weightshifting due to posterior lean  End of Session OT - End of Session Equipment Utilized During Treatment: Gait belt;Cervical collar Activity Tolerance: Patient tolerated treatment well Patient left: in chair;with call bell/phone within reach Nurse Communication: Mobility status       Margarite Vessel, OTR/L Pager [phone] 06/18/2012, 10:38 AM

## 2012-06-18 NOTE — Progress Notes (Signed)
Peripherally Inserted Central Catheter/Midline Placement  The IV Nurse has discussed with the patient and/or persons authorized to consent for the patient, the purpose of this procedure and the potential benefits and risks involved with this procedure.  The benefits include less needle sticks, lab draws from the catheter and patient may be discharged home with the catheter.  Risks include, but not limited to, infection, bleeding, blood clot (thrombus formation), and puncture of an artery; nerve damage and irregular heat beat.  Alternatives to this procedure were also discussed.  PICC/Midline Placement Documentation  PICC / Midline Single Lumen 06/18/12 PICC Right Basilic (Active)       Stacie Glaze Horton 06/18/2012, 9:11 AM

## 2012-06-19 LAB — CULTURE, BLOOD (ROUTINE X 2): Culture: NO GROWTH

## 2012-06-19 LAB — GLUCOSE, CAPILLARY
Glucose-Capillary: 98 mg/dL (ref 70–99)
Glucose-Capillary: 93 mg/dL (ref 70–99)

## 2012-06-19 MED ORDER — HEPARIN SOD (PORK) LOCK FLUSH 100 UNIT/ML IV SOLN
250.0000 [IU] | INTRAVENOUS | Status: AC | PRN
  Administered 2012-06-19: 500 [IU]

## 2012-06-19 NOTE — Progress Notes (Signed)
1225 Report called to Nursing facility Autumn Messing report given to [facility].  Patient wearing cervical collar. Staples intact to scalp from laceration, sacral redden using EPC  And turned and repositioned for comfort,Foley catheter intact as per order and Picc line intact.,

## 2012-06-19 NOTE — Progress Notes (Signed)
PT Cancellation Note  Treatment cancelled today due to pt plans to d/c SNF today. Will defer further PT to SNF. Humberto Seals HELEN 06/19/2012, 11:26 AM Pager: [phone]

## 2012-06-19 NOTE — Progress Notes (Signed)
Clinical Social Work:  Patient medically stable for dc. Patient has a new bed at the [facility] and is ready for dc and facility accepting him today. Patient HCPOA has been notified and agreeable. Plan is for him to DC and transport by EMS. Will complete dc paperwork and update information.  No other needs at this time.  Will sign off   DC to SNF: Stana Bunting, MSW LCSW [phone]  For Jacklynn Lewis

## 2012-06-19 NOTE — Care Management Note (Signed)
    Page 1 of 1   06/19/2012     1:18:05 PM   CARE MANAGEMENT NOTE 06/19/2012  Patient:  [name], [name]   Account Number:  [id number]  Date Initiated:  06/13/2012  Documentation initiated by:  Avie Arenas  Subjective/Objective Assessment:   SIRS  From SNF     Action/Plan:   Anticipated DC Date:  06/19/2012   Anticipated DC Plan:  SKILLED NURSING FACILITY  In-house referral  Clinical Social Worker      DC Planning Services  CM consult      Choice offered to / List presented to:             Status of service:  Completed, signed off Medicare Important Message given?   (If response is "NO", the following Medicare IM given date fields will be blank) Date Medicare IM given:   Date Additional Medicare IM given:    Discharge Disposition:  SKILLED NURSING FACILITY  Per UR Regulation:  Reviewed for med. necessity/level of care/duration of stay  If discussed at Long Length of Stay Meetings, dates discussed:    Comments:  06/19/12 13:16 Letha Cape RN, BSN [phone] patient dc to snf today, CSW following.

## 2012-06-19 NOTE — Progress Notes (Addendum)
PATIENT DETAILS Name: [name] Age: 77 y.o. Sex: male Date of Birth: 1934/12/28 Admit Date: 06/12/2012 ZOX:WRUEAVW,UJW, MD  Subjective: No major issues overnight-at baseline  Objective: Vital signs in last 24 hours: Filed Vitals:   06/18/12 0939 06/18/12 1300 06/18/12 2212 06/19/12 0627  BP: 134/75 106/67 146/84 147/80  Pulse:  55 51 59  Temp:  98.1 F (36.7 C) 98.7 F (37.1 C) 98 F (36.7 C)  TempSrc:  Oral Oral Oral  Resp:  20 20 18   Height:      Weight:      SpO2:  99% 100% 96%    Weight change:   Body mass index is 19.61 kg/(m^2).  Intake/Output from previous day:  Intake/Output Summary (Last 24 hours) at 06/19/12 0945 Last data filed at 06/19/12 1191  Gross per 24 hour  Intake    872 ml  Output   1200 ml  Net   -328 ml    PHYSICAL EXAM: Gen Exam: Awake with clear speech.  Neck: Supple, No JVD.  Chest: B/L Clear.   CVS: S1 S2 Regular, no murmurs.  Abdomen: soft, BS +, non tender, non distended.  Extremities: no edema, lower extremities warm to touch. Neurologic: Non Focal.   Skin: No Rash.   Wounds: N/A.    CONSULTS:  ID  LAB RESULTS: CBC  Lab 06/15/12 0330 06/13/12 0004 06/12/12 1830  WBC 8.6 13.8* 16.1*  HGB 12.1* 12.2* 12.5*  HCT 36.1* 37.1* 37.4*  PLT 294 307 299  MCV 90.7 92.8 91.9  MCH 30.4 30.5 30.7  MCHC 33.5 32.9 33.4  RDW 13.5 14.0 13.8  LYMPHSABS -- -- 0.6*  MONOABS -- -- 0.6  EOSABS -- -- 0.1  BASOSABS -- -- 0.0  BANDABS -- -- --    Chemistries   Lab 06/16/12 0705 06/15/12 0330 06/13/12 0004 06/12/12 1830  NA 140 141 -- 136  K 4.2 4.1 -- 4.4  CL 110 111 -- 105  CO2 22 20 -- 21  GLUCOSE 108* 116* -- 99  BUN 15 11 -- 49*  CREATININE 0.84 0.85 1.59* 1.79*  CALCIUM 9.0 9.0 -- 9.2  MG -- -- -- --    CBG:  Lab 06/19/12 0725 06/18/12 2157 06/18/12 1654 06/18/12 1151 06/18/12 0802  GLUCAP 98 90 142* 103* 112*    GFR Estimated Creatinine Clearance: 66.6 ml/min (by C-G formula based on Cr of  0.84).  Coagulation profile No results found for this basename: INR:5,PROTIME:5 in the last 168 hours  Cardiac Enzymes  Lab 06/12/12 2349  CKMB 1.9  TROPONINI <0.30  MYOGLOBIN --    No components found with this basename: POCBNP:3 No results found for this basename: DDIMER:2 in the last 72 hours No results found for this basename: HGBA1C:2 in the last 72 hours No results found for this basename: CHOL:2,HDL:2,LDLCALC:2,TRIG:2,CHOLHDL:2,LDLDIRECT:2 in the last 72 hours No results found for this basename: TSH,T4TOTAL,FREET3,T3FREE,THYROIDAB in the last 72 hours No results found for this basename: VITAMINB12:2,FOLATE:2,FERRITIN:2,TIBC:2,IRON:2,RETICCTPCT:2 in the last 72 hours No results found for this basename: LIPASE:2,AMYLASE:2 in the last 72 hours  Urine Studies No results found for this basename: UACOL:2,UAPR:2,USPG:2,UPH:2,UTP:2,UGL:2,UKET:2,UBIL:2,UHGB:2,UNIT:2,UROB:2,ULEU:2,UEPI:2,UWBC:2,URBC:2,UBAC:2,CAST:2,CRYS:2,UCOM:2,BILUA:2 in the last 72 hours  MICROBIOLOGY: Recent Results (from the past 240 hour(s))  URINE CULTURE     Status: Normal   Collection Time   06/12/12  6:31 PM      Component Value Range Status Comment   Specimen Description URINE, CLEAN CATCH   Final    Special Requests NONE   Final  Culture  Setup Time [id number]   Final    Colony Count 25,000 COLONIES/ML   Final    Culture     Final    Value: Multiple bacterial morphotypes present, none predominant. Suggest appropriate recollection if clinically indicated.   Report Status 06/14/2012 FINAL   Final   CULTURE, BLOOD (ROUTINE X 2)     Status: Normal   Collection Time   06/12/12  6:35 PM      Component Value Range Status Comment   Specimen Description BLOOD ARM LEFT   Final    Special Requests BOTTLES DRAWN AEROBIC ONLY 8CC   Final    Culture  Setup Time 06/13/2012 01:51   Final    Culture NO GROWTH 5 DAYS   Final    Report Status 06/19/2012 FINAL   Final   CULTURE, BLOOD (ROUTINE X 2)     Status:  Normal   Collection Time   06/12/12  6:45 PM      Component Value Range Status Comment   Specimen Description BLOOD HAND LEFT   Final    Special Requests BOTTLES DRAWN AEROBIC ONLY 5CC   Final    Culture  Setup Time 06/13/2012 01:51   Final    Culture NO GROWTH 5 DAYS   Final    Report Status 06/19/2012 FINAL   Final   MRSA PCR SCREENING     Status: Normal   Collection Time   06/12/12 11:26 PM      Component Value Range Status Comment   MRSA by PCR NEGATIVE  NEGATIVE Final     RADIOLOGY STUDIES/RESULTS: Dg Chest 1 View  06/12/2012  *RADIOLOGY REPORT*  Clinical Data: 77 year old male Cough, altered mental status.  CHEST - 1 VIEW  Comparison: 06/04/2012 and earlier.  Findings: Stable cardiomegaly and mediastinal contours.  No pneumothorax or pulmonary edema.  No pleural effusion.  Increased streaky opacity at the lung bases.  Multiple chronic left rib fractures.  IMPRESSION: Lower lung volumes with increased basilar atelectasis.  Original Report Authenticated By: Harley Hallmark, M.D.   Dg Chest 2 View  06/04/2012  *RADIOLOGY REPORT*  Clinical Data: Altered mental status.  CHEST - 2 VIEW  Comparison: 04/21/2012  Findings: Cardiomegaly.  Lungs are clear.  No effusions or edema. Multiple old left rib fractures, stable.  Advanced degenerative changes in the shoulders bilaterally.  IMPRESSION: Cardiomegaly.  No acute findings.  Original Report Authenticated By: Cyndie Chime, M.D.   Ct Head Wo Contrast  06/12/2012  *RADIOLOGY REPORT*  Clinical Data: 77 year old male with altered mental status. Hypertension.  Recent fall.  CT HEAD WITHOUT CONTRAST  Technique:  Contiguous axial images were obtained from the base of the skull through the vertex without contrast.  Comparison: 06/07/2012 and earlier.  Findings: Left scalp skin staples in place.  No acute scalp or orbit soft tissue abnormality.  The left tympanic cavity and mastoids remain opacified.  Trace fluid level in the left sphenoid sinus. No acute  osseous abnormality identified.  Calcified atherosclerosis at the skull base.  Mega cisterna magna variant again noted. Stable ventricle size and configuration. Dural calcifications. No acute intracranial hemorrhage identified. No evidence of cortically based acute infarction identified.  No suspicious intracranial vascular hyperdensity.  IMPRESSION: Stable brain. No acute intracranial abnormality.  Original Report Authenticated By: Harley Hallmark, M.D.   Ct Head Wo Contrast  06/07/2012  *RADIOLOGY REPORT*  Clinical Data:  Status post unwitnessed fall; laceration to the left forehead.  Assess for cervical spine  injury.  CT HEAD WITHOUT CONTRAST AND CT CERVICAL SPINE WITHOUT CONTRAST  Technique:  Multidetector CT imaging of the head and cervical spine was performed following the standard protocol without intravenous contrast.  Multiplanar CT image reconstructions of the cervical spine were also generated.  Comparison: CT of the head performed 05/24/2012, MRI of the brain performed 06/04/2012, and CT of the cervical spine performed 09/06/2007  CT HEAD  Findings: There is no evidence of acute infarction, mass lesion, or intra- or extra-axial hemorrhage on CT.  Prominence of the ventricles and sulci reflects mild cortical volume loss.  Cerebellar atrophy is noted.  Diffuse periventricular and subcortical white matter change likely reflects small vessel ischemic microangiopathy.  The brainstem and fourth ventricle are within normal limits.  The basal ganglia are unremarkable in appearance.  The cerebral hemispheres demonstrate grossly normal gray-white differentiation. No mass effect or midline shift is seen.  There is no evidence of fracture; visualized osseous structures are unremarkable in appearance.  The orbits are within normal limits. The paranasal sinuses and mastoid air cells are well-aerated.  Soft tissue swelling is noted overlying the left frontoparietal calvarium.  IMPRESSION:  1.  No evidence of  traumatic intracranial injury or fracture. 2.  Soft tissue swelling overlying the left frontoparietal calvarium. 3.  Mild cortical volume loss and diffuse small vessel ischemic microangiopathy.  CT CERVICAL SPINE  Findings: There is a small minimally displaced fracture involving the anterior aspect of the right transverse process of C2, at the superior articular surface. This does not appear to extend into the right transverse foramen, and associated vascular injury is considered unlikely.  This is new from 2008.  No additional fractures are seen.  Vertebral bodies demonstrate normal height and alignment.  There is mild multilevel disc space narrowing along the lower cervical spine, with associated small anterior and posterior disc osteophyte complexes.  Facet disease is noted along the cervical spine.  Prevertebral soft tissues are within normal limits.  The thyroid gland is unremarkable in appearance.  The visualized lung apices are clear.  Mild scattered calcification is noted at the carotid bifurcations bilaterally.  IMPRESSION:  1.  Small minimally displaced fracture involving the anterior aspect of the right transverse process of C2, at the superior articular surface.  This does not appear to extend into the right transverse foramen, and associated vascular injury is considered unlikely. 2.  No additional fractures seen. 3.  Mild degenerative change along the lower cervical spine. 4.  Mild scattered calcification at the carotid bifurcations bilaterally.  These results were called by telephone on 06/07/2012  at  09:58 p.m. to  [facility], who verbally acknowledged these results.  Original Report Authenticated By: Tonia Ghent, M.D.   Ct Head Wo Contrast  05/24/2012  This examination was performed at [facility] at [address] [facility]. The interpretation will be provided by [facility]  Original Report Authenticated By: Jamesetta Orleans. MATTERN, M.D.   Ct Cervical Spine  Wo Contrast  06/07/2012  *RADIOLOGY REPORT*  Clinical Data:  Status post unwitnessed fall; laceration to the left forehead.  Assess for cervical spine injury.  CT HEAD WITHOUT CONTRAST AND CT CERVICAL SPINE WITHOUT CONTRAST  Technique:  Multidetector CT imaging of the head and cervical spine was performed following the standard protocol without intravenous contrast.  Multiplanar CT image reconstructions of the cervical spine were also generated.  Comparison: CT of the head performed 05/24/2012, MRI of the brain performed 06/04/2012, and CT of the cervical spine  performed 09/06/2007  CT HEAD  Findings: There is no evidence of acute infarction, mass lesion, or intra- or extra-axial hemorrhage on CT.  Prominence of the ventricles and sulci reflects mild cortical volume loss.  Cerebellar atrophy is noted.  Diffuse periventricular and subcortical white matter change likely reflects small vessel ischemic microangiopathy.  The brainstem and fourth ventricle are within normal limits.  The basal ganglia are unremarkable in appearance.  The cerebral hemispheres demonstrate grossly normal gray-white differentiation. No mass effect or midline shift is seen.  There is no evidence of fracture; visualized osseous structures are unremarkable in appearance.  The orbits are within normal limits. The paranasal sinuses and mastoid air cells are well-aerated.  Soft tissue swelling is noted overlying the left frontoparietal calvarium.  IMPRESSION:  1.  No evidence of traumatic intracranial injury or fracture. 2.  Soft tissue swelling overlying the left frontoparietal calvarium. 3.  Mild cortical volume loss and diffuse small vessel ischemic microangiopathy.  CT CERVICAL SPINE  Findings: There is a small minimally displaced fracture involving the anterior aspect of the right transverse process of C2, at the superior articular surface. This does not appear to extend into the right transverse foramen, and associated vascular injury is  considered unlikely.  This is new from 2008.  No additional fractures are seen.  Vertebral bodies demonstrate normal height and alignment.  There is mild multilevel disc space narrowing along the lower cervical spine, with associated small anterior and posterior disc osteophyte complexes.  Facet disease is noted along the cervical spine.  Prevertebral soft tissues are within normal limits.  The thyroid gland is unremarkable in appearance.  The visualized lung apices are clear.  Mild scattered calcification is noted at the carotid bifurcations bilaterally.  IMPRESSION:  1.  Small minimally displaced fracture involving the anterior aspect of the right transverse process of C2, at the superior articular surface.  This does not appear to extend into the right transverse foramen, and associated vascular injury is considered unlikely. 2.  No additional fractures seen. 3.  Mild degenerative change along the lower cervical spine. 4.  Mild scattered calcification at the carotid bifurcations bilaterally.  These results were called by telephone on 06/07/2012  at  09:58 p.m. to  [facility] PA, who verbally acknowledged these results.  Original Report Authenticated By: Tonia Ghent, M.D.   Mr Brain Wo Contrast  06/04/2012  *RADIOLOGY REPORT*  Clinical Data: Slurred speech.  Altered mental status.  MRI HEAD WITHOUT CONTRAST  Technique:  Multiplanar, multiecho pulse sequences of the brain and surrounding structures were obtained according to standard protocol without intravenous contrast.  Comparison: 05/24/2012 CT.  No comparison MR.  Findings: Motion degraded exam.  No acute infarct (artifact left parietal lobe series 4 image 25).  No intracranial hemorrhage.  Global atrophy.  Ventricular prominence may be related to atrophy although difficult to completely exclude a mild component of hydrocephalus.  Mega cisterna magna.  Mild small vessel disease type changes.  No intracranial mass lesion detected on this unenhanced exam.   Major intracranial vascular structures are patent. Prominent ectasia of the right internal carotid artery supraclinoid segment with superior displacement of the ectatic right anterior cerebral artery causing compression of the undersurface of the ventricle and the right optic nerve with mild deformity of the optic chiasm.  Paranasal sinus mild mucosal thickening with polypoid appearance inferior right maxillary sinus.  Bilateral mastoid air cell opacification without mass seen in the region of the posterior- superior nasopharynx causing eustachian tube dysfunction.  IMPRESSION: Motion degraded exam.  No acute infarct.  No intracranial hemorrhage.  Global atrophy.  Ventricular prominence may be related to atrophy although difficult to completely exclude a mild component of hydrocephalus.  Mega cisterna magna.  Mild small vessel disease type changes.  Prominent ectasia of the right internal carotid artery supraclinoid segment with superior displacement of the ectatic right anterior cerebral artery causing compression of the undersurface of the ventricle and the right optic nerve with mild deformity of the optic chiasm.  Paranasal sinus mild mucosal thickening with polypoid appearance inferior right maxillary sinus.  Bilateral mastoid air cell opacification.  Original Report Authenticated By: Fuller Canada, M.D.   Dg Shoulder Left  06/13/2012  *RADIOLOGY REPORT*  Clinical Data: Fall.  Will not move left arm.  Left shoulder pain.  LEFT SHOULDER - 2+ VIEW  Comparison: None.  Findings: No fracture or dislocation.  Advanced degenerative changes in the glenohumeral joint with loss of joint space, subchondral sclerosis and osteophytosis.  Degenerative changes are also seen in the left acromioclavicular joint, with slight elevation of the distal left clavicle, of unknown chronicity. The latter is seen on only one view.  Rounded ossific densities are seen inferior to the acromioclavicular joint.  Visualized portion of  the left chest shows multiple old left rib fractures.  IMPRESSION:  1.  No acute findings. 2.  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 3.  Acromioclavicular joint osteoarthritis.  Elevation of the distal left clavicle with respect to the acromion on one view may indicate a strain injury, age indeterminate. 4.  Osseous densities inferior to the acromioclavicular joint may represent loose bodies.  Original Report Authenticated By: Reyes Ivan, M.D.   Dg Hand Complete Left  06/07/2012  *RADIOLOGY REPORT*  Clinical Data: Fall, abrasions.  LEFT HAND - COMPLETE 3+ VIEW  Comparison: None.  Findings: Moderate degenerative changes throughout the IP joints, MCP joints and first carpal metacarpal joint. No acute bony abnormality.  Specifically, no fracture, subluxation, or dislocation.  Soft tissues are intact.  IMPRESSION: No acute bony abnormality.  Original Report Authenticated By: Cyndie Chime, M.D.   Dg Fluoro Guide Lumbar Puncture  05/31/2012  *RADIOLOGY REPORT*  Clinical Data:  Confusion.  Gait disturbance.  Parkinson's disease.  DIAGNOSTIC LUMBAR PUNCTURE UNDER FLUOROSCOPIC GUIDANCE  Fluoroscopy time:  23 seconds .  Technique:  Informed consent was obtained from the patient prior to the procedure, including potential complications of headache, allergy, and pain.   With the patient prone, the lower back was prepped with Betadine.  1% Lidocaine was used for local anesthesia. Lumbar puncture was performed at the right L4-5 level using a 20 gauge needle with return of slightly turbid CSF with an opening pressure of approximately 10 cm water. The patient was too debilitated tube rolled to the side.  The CSF pressure is quite low, barely reaching the have of the needle.  With raising the head, some spontaneous flow occurred.  I wound at the having to gently aspirated using a syringe to require a large volume requested.  25  ml of CSF were obtained for laboratory studies. The patient tolerated  the procedure well and there were no apparent complications.  IMPRESSION: 25 ml of CSF obtained for requested studies.  Opening pressure was quite low, 10 centimeters of water or less.  See above discussion.  Original Report Authenticated By: Thomasenia Sales, M.D.    MEDICATIONS: Scheduled Meds:   . amLODipine  5 mg Oral Daily  . carbidopa-levodopa  1 tablet Oral TID  . enoxaparin (LOVENOX) injection  40 mg Subcutaneous Daily  . entacapone  200 mg Oral TID  . insulin aspart  0-9 Units Subcutaneous TID WC  . lamoTRIgine  150 mg Oral BID  . lithium carbonate  150 mg Oral BID WC  . pantoprazole  40 mg Oral Q1200  . ramipril  5 mg Oral BID  . sertraline  100 mg Oral Daily  . vancomycin  750 mg Intravenous Q12H   Continuous Infusions:   . DISCONTD: sodium chloride 50 mL/hr at 06/19/12 0500   PRN Meds:.acetaminophen, acetaminophen, LORazepam, ondansetron (ZOFRAN) IV, ondansetron, sodium chloride  Antibiotics: Anti-infectives     Start     Dose/Rate Route Frequency Ordered Stop   06/18/12 0600   vancomycin (VANCOCIN) 750 mg in sodium chloride 0.9 % 150 mL IVPB        750 mg 150 mL/hr over 60 Minutes Intravenous Every 12 hours 06/17/12 1935     06/18/12 0000   sodium chloride 0.9 % SOLN 150 mL with vancomycin 1000 MG SOLR 750 mg        750 mg 150 mL/hr over 60 Minutes Intravenous Every 12 hours 06/18/12 1026     06/16/12 0600   vancomycin (VANCOCIN) IVPB 1000 mg/200 mL premix  Status:  Discontinued        1,000 mg 200 mL/hr over 60 Minutes Intravenous Every 12 hours 06/16/12 0551 06/17/12 1934   06/13/12 2200   ceFEPIme (MAXIPIME) 2 g in dextrose 5 % 50 mL IVPB  Status:  Discontinued        2 g 100 mL/hr over 30 Minutes Intravenous Every 24 hours 06/13/12 0839 06/14/12 1622   06/12/12 2300   ceFEPIme (MAXIPIME) 2 g in dextrose 5 % 50 mL IVPB  Status:  Discontinued        2 g 100 mL/hr over 30 Minutes Intravenous Every 12 hours 06/12/12 2151 06/13/12 0839   06/12/12 2300    levofloxacin (LEVAQUIN) IVPB 750 mg  Status:  Discontinued        750 mg 100 mL/hr over 90 Minutes Intravenous Every 48 hours 06/12/12 2151 06/14/12 1622   06/12/12 2200   vancomycin (VANCOCIN) IVPB 1000 mg/200 mL premix  Status:  Discontinued        1,000 mg 200 mL/hr over 60 Minutes Intravenous Every 24 hours 06/12/12 2151 06/16/12 0551   06/12/12 2000   cefTRIAXone (ROCEPHIN) 1 g in dextrose 5 % 50 mL IVPB        1 g 100 mL/hr over 30 Minutes Intravenous  Once 06/12/12 1950 06/12/12 2107   06/12/12 2000   azithromycin (ZITHROMAX) 500 mg in dextrose 5 % 250 mL IVPB        500 mg 250 mL/hr over 60 Minutes Intravenous  Once 06/12/12 1950 06/12/12 2217          Assessment/Plan: Active Problems:  Metabolic encephalopathy:  -Resolved  -seems back to usual baseline  SIRS (systemic inflammatory response syndrome)  -resolved most likely 2/2 MRSA urinary tract infection.   MRSA UTI:  -Will continue IV vancomycin and presumptively treat as a complicated MRSA bacteremia for 3 wks. Given Cervical spine fracture, doubt will be able to do TEE  Diabetes mellitus CBG's stable  Acute renal failure  Likely simply due to [facility] - resolved w/ IVF.   Recent fall w/ known C2 fx  Small minimally displaced fracture involving the anterior aspect of the right transverse process of C2, at the  superior articular surface - maintain in cervical collar w/ outpt NS f/u   Parkinson's disease  Followed by Dr. Anne Hahn in the outpatient setting - cont current med tx - no evidence of movement difficulty at present - I am concerned about the onset of Parkinson's associated dementia - will defer to his outpt Neurologist in f/u   Bipolar 1 disorder  -lithium toxicity resolved.  - This probably secondary to decreased intravascular volume.   Disposition: To SNF today-see D/C summary done by Dr Steva Colder, MD  [facility] Pager [phone]  If 7PM-7AM, please contact  night-coverage [email or web address] Password [email or web address] 06/19/2012, 9:45 AM   LOS: 7 days

## 2012-07-04 ENCOUNTER — Other Ambulatory Visit: Payer: Self-pay | Admitting: Neurology

## 2012-07-04 DIAGNOSIS — R410 Disorientation, unspecified: Secondary | ICD-10-CM

## 2012-07-04 DIAGNOSIS — G20 Parkinson's disease: Secondary | ICD-10-CM

## 2012-07-04 DIAGNOSIS — R269 Unspecified abnormalities of gait and mobility: Secondary | ICD-10-CM

## 2012-07-05 ENCOUNTER — Ambulatory Visit
Admission: RE | Admit: 2012-07-05 | Discharge: 2012-07-05 | Disposition: A | Discharge status: Home / Self Care | Payer: No Typology Code available for payment source | Source: Ambulatory Visit | Attending: Neurology | Admitting: Neurology

## 2012-07-05 DIAGNOSIS — R269 Unspecified abnormalities of gait and mobility: Secondary | ICD-10-CM

## 2012-07-05 DIAGNOSIS — G20 Parkinson's disease: Secondary | ICD-10-CM

## 2012-07-05 DIAGNOSIS — R410 Disorientation, unspecified: Secondary | ICD-10-CM

## 2012-07-10 ENCOUNTER — Telehealth (HOSPITAL_COMMUNITY): Payer: Self-pay

## 2012-07-10 NOTE — Telephone Encounter (Signed)
4:27PM 07/10/12 PT CALLED STATING PERSONAL REASONS THAT HE NEED TO SPEAK WIHT DR. ARFEEN./SH

## 2012-07-11 ENCOUNTER — Other Ambulatory Visit (HOSPITAL_COMMUNITY): Payer: Self-pay | Admitting: Psychiatry

## 2012-07-15 ENCOUNTER — Other Ambulatory Visit (HOSPITAL_COMMUNITY): Payer: Self-pay | Admitting: Psychiatry

## 2012-07-15 NOTE — Progress Notes (Signed)
Called returned at [phone].  Patient told that he has been in the hospital multiple times due to various medical reason.  He has UTI and he fractured his C2 vertebra.  He's currently in rehabilitation for another 2-3 weeks.  I spoke to his power of attorney that patient will call us when he finished his rehabilitation.  He will call us for appointment.  I recommend him and have power of attorney to bring current list of medication on his future appointment.

## 2012-08-12 ENCOUNTER — Emergency Department (HOSPITAL_COMMUNITY): Payer: Medicare Other

## 2012-08-12 ENCOUNTER — Inpatient Hospital Stay (HOSPITAL_COMMUNITY)
Admission: EM | Admit: 2012-08-12 | Discharge: 2012-08-17 | DRG: 698 | Disposition: A | Discharge status: Skilled Nursing Facility | Payer: Medicare Other | Attending: Internal Medicine | Admitting: Internal Medicine

## 2012-08-12 ENCOUNTER — Encounter (HOSPITAL_COMMUNITY): Payer: Self-pay | Admitting: *Deleted

## 2012-08-12 DIAGNOSIS — N179 Acute kidney failure, unspecified: Secondary | ICD-10-CM | POA: Diagnosis present

## 2012-08-12 DIAGNOSIS — T83511A Infection and inflammatory reaction due to indwelling urethral catheter, initial encounter: Principal | ICD-10-CM | POA: Diagnosis present

## 2012-08-12 DIAGNOSIS — F419 Anxiety disorder, unspecified: Secondary | ICD-10-CM | POA: Diagnosis present

## 2012-08-12 DIAGNOSIS — M1990 Unspecified osteoarthritis, unspecified site: Secondary | ICD-10-CM

## 2012-08-12 DIAGNOSIS — I10 Essential (primary) hypertension: Secondary | ICD-10-CM | POA: Diagnosis present

## 2012-08-12 DIAGNOSIS — E860 Dehydration: Secondary | ICD-10-CM

## 2012-08-12 DIAGNOSIS — W19XXXA Unspecified fall, initial encounter: Secondary | ICD-10-CM | POA: Diagnosis present

## 2012-08-12 DIAGNOSIS — G929 Unspecified toxic encephalopathy: Secondary | ICD-10-CM | POA: Diagnosis present

## 2012-08-12 DIAGNOSIS — G92 Toxic encephalopathy: Secondary | ICD-10-CM

## 2012-08-12 DIAGNOSIS — F319 Bipolar disorder, unspecified: Secondary | ICD-10-CM | POA: Diagnosis present

## 2012-08-12 DIAGNOSIS — Z978 Presence of other specified devices: Secondary | ICD-10-CM

## 2012-08-12 DIAGNOSIS — Z8614 Personal history of Methicillin resistant Staphylococcus aureus infection: Secondary | ICD-10-CM

## 2012-08-12 DIAGNOSIS — R7881 Bacteremia: Secondary | ICD-10-CM

## 2012-08-12 DIAGNOSIS — S12100A Unspecified displaced fracture of second cervical vertebra, initial encounter for closed fracture: Secondary | ICD-10-CM | POA: Diagnosis present

## 2012-08-12 DIAGNOSIS — R339 Retention of urine, unspecified: Secondary | ICD-10-CM | POA: Diagnosis present

## 2012-08-12 DIAGNOSIS — Z974 Presence of external hearing-aid: Secondary | ICD-10-CM

## 2012-08-12 DIAGNOSIS — Z7982 Long term (current) use of aspirin: Secondary | ICD-10-CM

## 2012-08-12 DIAGNOSIS — G709 Myoneural disorder, unspecified: Secondary | ICD-10-CM

## 2012-08-12 DIAGNOSIS — Y849 Medical procedure, unspecified as the cause of abnormal reaction of the patient, or of later complication, without mention of misadventure at the time of the procedure: Secondary | ICD-10-CM | POA: Diagnosis present

## 2012-08-12 DIAGNOSIS — R4182 Altered mental status, unspecified: Secondary | ICD-10-CM

## 2012-08-12 DIAGNOSIS — G20A1 Parkinson's disease without dyskinesia, without mention of fluctuations: Secondary | ICD-10-CM | POA: Diagnosis present

## 2012-08-12 DIAGNOSIS — N390 Urinary tract infection, site not specified: Secondary | ICD-10-CM | POA: Diagnosis present

## 2012-08-12 DIAGNOSIS — E870 Hyperosmolality and hypernatremia: Secondary | ICD-10-CM | POA: Diagnosis present

## 2012-08-12 DIAGNOSIS — G20 Parkinson's disease: Secondary | ICD-10-CM

## 2012-08-12 DIAGNOSIS — G9340 Encephalopathy, unspecified: Secondary | ICD-10-CM

## 2012-08-12 DIAGNOSIS — G9341 Metabolic encephalopathy: Secondary | ICD-10-CM

## 2012-08-12 DIAGNOSIS — E119 Type 2 diabetes mellitus without complications: Secondary | ICD-10-CM | POA: Diagnosis present

## 2012-08-12 HISTORY — DX: Systemic inflammatory response syndrome (sirs) of non-infectious origin without acute organ dysfunction: R65.10

## 2012-08-12 LAB — CBC
Hemoglobin: 12.2 g/dL — ABNORMAL LOW (ref 13.0–17.0)
MCH: 30.6 pg (ref 26.0–34.0)
MCHC: 33.2 g/dL (ref 30.0–36.0)
MCV: 92 fL (ref 78.0–100.0)

## 2012-08-12 LAB — CBC WITH DIFFERENTIAL/PLATELET
Basophils Absolute: 0 10*3/uL (ref 0.0–0.1)
Eosinophils Relative: 1 % (ref 0–5)
Lymphocytes Relative: 6 % — ABNORMAL LOW (ref 12–46)
Lymphs Abs: 1.1 10*3/uL (ref 0.7–4.0)
Neutro Abs: 15 10*3/uL — ABNORMAL HIGH (ref 1.7–7.7)
Neutrophils Relative %: 87 % — ABNORMAL HIGH (ref 43–77)
Platelets: 349 10*3/uL (ref 150–400)
RBC: 4.41 MIL/uL (ref 4.22–5.81)
RDW: 14 % (ref 11.5–15.5)
WBC: 17.3 10*3/uL — ABNORMAL HIGH (ref 4.0–10.5)

## 2012-08-12 LAB — URINALYSIS, ROUTINE W REFLEX MICROSCOPIC
Glucose, UA: NEGATIVE mg/dL
Protein, ur: 100 mg/dL — AB
Specific Gravity, Urine: 1.015 (ref 1.005–1.030)

## 2012-08-12 LAB — BASIC METABOLIC PANEL
CO2: 24 mEq/L (ref 19–32)
Calcium: 9.9 mg/dL (ref 8.4–10.5)
Chloride: 108 mEq/L (ref 96–112)
Glucose, Bld: 95 mg/dL (ref 70–99)
Potassium: 4.7 mEq/L (ref 3.5–5.1)
Sodium: 142 mEq/L (ref 135–145)

## 2012-08-12 LAB — GLUCOSE, CAPILLARY
Glucose-Capillary: 136 mg/dL — ABNORMAL HIGH (ref 70–99)
Glucose-Capillary: 128 mg/dL — ABNORMAL HIGH (ref 70–99)
Glucose-Capillary: 106 mg/dL — ABNORMAL HIGH (ref 70–99)

## 2012-08-12 LAB — URINE MICROSCOPIC-ADD ON

## 2012-08-12 LAB — CREATININE, SERUM: Creatinine, Ser: 2.25 mg/dL — ABNORMAL HIGH (ref 0.50–1.35)

## 2012-08-12 MED ORDER — LORAZEPAM 0.5 MG PO TABS
0.5000 mg | ORAL_TABLET | Freq: Every day | ORAL | Status: DC
  Administered 2012-08-12 – 2012-08-14 (×2): 0.5 mg via ORAL
  Filled 2012-08-12 (×2): qty 1

## 2012-08-12 MED ORDER — CHLORHEXIDINE GLUCONATE CLOTH 2 % EX PADS
6.0000 | MEDICATED_PAD | Freq: Every day | CUTANEOUS | Status: AC
  Administered 2012-08-13 – 2012-08-17 (×5): 6 via TOPICAL

## 2012-08-12 MED ORDER — ASPIRIN 81 MG PO TABS: 81.0000 mg | ORAL_TABLET | Freq: Every day | ORAL | Status: DC

## 2012-08-12 MED ORDER — CEFTRIAXONE SODIUM 1 G IJ SOLR: 1.0000 g | Freq: Once | INTRAMUSCULAR | Status: DC

## 2012-08-12 MED ORDER — PANTOPRAZOLE SODIUM 40 MG PO TBEC
40.0000 mg | DELAYED_RELEASE_TABLET | Freq: Every day | ORAL | Status: DC
  Administered 2012-08-14 – 2012-08-16 (×2): 40 mg via ORAL
  Filled 2012-08-12 (×3): qty 1

## 2012-08-12 MED ORDER — SODIUM CHLORIDE 0.9 % IV BOLUS (SEPSIS)
500.0000 mL | Freq: Once | INTRAVENOUS | Status: AC
  Administered 2012-08-12: 500 mL via INTRAVENOUS

## 2012-08-12 MED ORDER — INSULIN ASPART 100 UNIT/ML ~~LOC~~ SOLN
0.0000 [IU] | SUBCUTANEOUS | Status: DC
  Administered 2012-08-12: 1 [IU] via SUBCUTANEOUS
  Administered 2012-08-13: 2 [IU] via SUBCUTANEOUS
  Administered 2012-08-13 (×2): 1 [IU] via SUBCUTANEOUS
  Administered 2012-08-14: 2 [IU] via SUBCUTANEOUS
  Administered 2012-08-14 (×2): 1 [IU] via SUBCUTANEOUS
  Administered 2012-08-15: 2 [IU] via SUBCUTANEOUS
  Administered 2012-08-15 – 2012-08-16 (×3): 1 [IU] via SUBCUTANEOUS

## 2012-08-12 MED ORDER — PNEUMOCOCCAL VAC POLYVALENT 25 MCG/0.5ML IJ INJ
0.5000 mL | INJECTION | INTRAMUSCULAR | Status: AC
  Administered 2012-08-13: 0.5 mL via INTRAMUSCULAR
  Filled 2012-08-12: qty 0.5

## 2012-08-12 MED ORDER — INSULIN ASPART 100 UNIT/ML ~~LOC~~ SOLN: 0.0000 [IU] | Freq: Three times a day (TID) | SUBCUTANEOUS | Status: DC

## 2012-08-12 MED ORDER — TEMAZEPAM 15 MG PO CAPS
30.0000 mg | ORAL_CAPSULE | Freq: Every evening | ORAL | Status: DC | PRN
  Administered 2012-08-17: 30 mg via ORAL
  Filled 2012-08-12: qty 2

## 2012-08-12 MED ORDER — SODIUM CHLORIDE 0.9 % IV SOLN
INTRAVENOUS | Status: DC
  Administered 2012-08-12 (×2): via INTRAVENOUS

## 2012-08-12 MED ORDER — MUPIROCIN 2 % EX OINT
1.0000 "application " | TOPICAL_OINTMENT | Freq: Two times a day (BID) | CUTANEOUS | Status: AC
  Administered 2012-08-12 – 2012-08-17 (×10): 1 via NASAL
  Filled 2012-08-12: qty 22

## 2012-08-12 MED ORDER — INSULIN ASPART 100 UNIT/ML ~~LOC~~ SOLN
0.0000 [IU] | Freq: Three times a day (TID) | SUBCUTANEOUS | Status: DC
  Administered 2012-08-12: 1 [IU] via SUBCUTANEOUS

## 2012-08-12 MED ORDER — ONDANSETRON HCL 4 MG/2ML IJ SOLN: 4.0000 mg | Freq: Four times a day (QID) | INTRAMUSCULAR | Status: DC | PRN

## 2012-08-12 MED ORDER — HEPARIN SODIUM (PORCINE) 5000 UNIT/ML IJ SOLN
5000.0000 [IU] | Freq: Three times a day (TID) | INTRAMUSCULAR | Status: DC
  Administered 2012-08-12 – 2012-08-17 (×12): 5000 [IU] via SUBCUTANEOUS
  Filled 2012-08-12 (×17): qty 1

## 2012-08-12 MED ORDER — ASPIRIN 81 MG PO CHEW
81.0000 mg | CHEWABLE_TABLET | Freq: Every day | ORAL | Status: DC
  Administered 2012-08-12 – 2012-08-17 (×5): 81 mg via ORAL
  Filled 2012-08-12 (×7): qty 1

## 2012-08-12 MED ORDER — DEXTROSE 5 % IV SOLN
1.0000 g | Freq: Once | INTRAVENOUS | Status: AC
  Administered 2012-08-12: 1 g via INTRAVENOUS
  Filled 2012-08-12: qty 10

## 2012-08-12 MED ORDER — SODIUM CHLORIDE 0.9 % IV SOLN
INTRAVENOUS | Status: AC
  Administered 2012-08-12: 150 mL/h via INTRAVENOUS

## 2012-08-12 MED ORDER — BIOTENE DRY MOUTH MT LIQD
15.0000 mL | Freq: Two times a day (BID) | OROMUCOSAL | Status: DC
  Administered 2012-08-13 – 2012-08-16 (×7): 15 mL via OROMUCOSAL

## 2012-08-12 MED ORDER — CHLORHEXIDINE GLUCONATE 0.12 % MT SOLN
15.0000 mL | Freq: Two times a day (BID) | OROMUCOSAL | Status: DC
  Administered 2012-08-12 – 2012-08-16 (×6): 15 mL via OROMUCOSAL
  Filled 2012-08-12 (×12): qty 15

## 2012-08-12 MED ORDER — ONDANSETRON HCL 4 MG PO TABS: 4.0000 mg | ORAL_TABLET | Freq: Four times a day (QID) | ORAL | Status: DC | PRN

## 2012-08-12 NOTE — H&P (Signed)
[facility] History and Physical  Elkin [employer]. XBJ:[id number] DOB: [birth year] DOA: 08/12/2012   PCP: [name], MD   Chief Complaint: confusion  HPI:  77 year-old male with history of hypertension, Parkinson disease, diabetes mellitus type 2, chronic indwelling Foley's catheter was brought from the nursing home because patient was found to be increasingly confused progressively getting worst, in the ED U/A was that showed significant WBC in urine with leukocytosis, AKI, Rocephin was given. Blood cultures were drawn after antibiotics were given. As per family member has remained afebrile. Some cough but no with eating or drinking. So we were ask to admit him. He has indwelling Foley's catheter. patient was brought to the ER  6 week ago with a fall and had sustained left scalp laceration which was sutured and CT head showed C2 fracture and is wearing a cervical collar.     Review of Systems:  Constitutional:  No weight loss, night sweats, Fevers, chills, fatigue.  HEENT:  No headaches, Difficulty swallowing,Tooth/dental problems,Sore throat,  No sneezing, itching, ear ache, nasal congestion, post nasal drip,  Cardio-vascular:  No chest pain, Orthopnea, PND, swelling in lower extremities, anasarca, dizziness, palpitations  GI:  No heartburn, indigestion, abdominal pain, nausea, vomiting, diarrhea, change in bowel habits, loss of appetite  Resp:  No shortness of breath with exertion or at rest. No excess mucus, no productive cough, No non-productive cough, No coughing up of blood.No change in color of mucus.No wheezing.No chest wall deformity  Skin:  no rash or lesions.  GU:  no dysuria, change in color of urine, no urgency or frequency. No flank pain.  Musculoskeletal:  No joint pain or swelling. No decreased range of motion. No back pain.  Psych:  No change in mood or affect. No depression or anxiety. No memory loss.    Past Medical History  Diagnosis Date  .  Hearing aid worn   . Hypertension   . Parkinson disease   . Pneumonia   . Diabetes mellitus type II   . Arthritis   . Bipolar disorder   . Anxiety   . Depression   . MRSA infection (methicillin-resistant Staphylococcus aureus)   . Chronic indwelling foley catheter   . C2 cervical fracture     due to pt fall  . SIRS (systemic inflammatory response syndrome)   . Neuromuscular disorder    Past Surgical History  Procedure Date  . Total hip arthroplasty    Social History:  reports that he has never smoked. He has never used smokeless tobacco. He reports that he drinks alcohol. He reports that he does not use illicit drugs.  Allergies  Allergen Reactions  . Cholestatin Other (See Comments)    Per [facility]    Family History  Problem Relation Age of Onset  . Depression Father     Prior to Admission medications   Medication Sig Start Date End Date Taking? Authorizing Provider  amLODipine (NORVASC) 5 MG tablet Take 5 mg by mouth daily.     Yes Historical Provider, MD  aspirin 81 MG tablet Take 81 mg by mouth daily.     Yes Historical Provider, MD  carbidopa-levodopa (SINEMET) 25-250 MG per tablet Take 1 tablet by mouth 3 (three) times daily.     Yes Historical Provider, MD  entacapone (COMTAN) 200 MG tablet Take 200 mg by mouth 3 (three) times daily.  12/15/11  Yes Historical Provider, MD  hydrochlorothiazide (HYDRODIURIL) 25 MG tablet Take 25 mg by mouth daily. HOLD FOR SBP<110  Yes Historical Provider, MD  lamoTRIgine (LAMICTAL) 150 MG tablet Take 150 mg by mouth 2 (two) times daily.   Yes Historical Provider, MD  lithium carbonate 300 MG capsule Take 300 mg by mouth 2 (two) times daily with a meal.   Yes Historical Provider, MD  LORazepam (ATIVAN) 0.5 MG tablet Take 0.5 mg by mouth at bedtime.   Yes Historical Provider, MD  metFORMIN (GLUCOPHAGE) 500 MG tablet Take 500 mg by mouth 2 (two) times daily with a meal.   Yes Historical Provider, MD  Multiple Vitamins-Minerals  (MULTIVITAMINS THER. W/MINERALS) TABS Take 1 tablet by mouth daily.     Yes Historical Provider, MD  omeprazole (PRILOSEC) 20 MG capsule Take 20 mg by mouth every morning.  12/15/11  Yes Historical Provider, MD  potassium chloride SA (K-DUR,KLOR-CON) 20 MEQ tablet Take 20 mEq by mouth daily.   Yes Historical Provider, MD  ramipril (ALTACE) 5 MG tablet Take 5 mg by mouth 2 (two) times daily. HOLD FOR SBP <110   Yes Historical Provider, MD  Red Yeast Rice Extract (RED YEAST RICE PO) Take 2 tablets by mouth at bedtime.     Yes Historical Provider, MD  sertraline (ZOLOFT) 100 MG tablet Take 100 mg by mouth daily.   Yes Historical Provider, MD  sodium chloride 0.9 % SOLN 150 mL with vancomycin 1000 MG SOLR 750 mg Inject 750 mg into the vein every 12 (twelve) hours. 06/18/12  Yes Marinda Elk, MD  temazepam (RESTORIL) 30 MG capsule Take 30 mg by mouth at bedtime as needed. FOR INSOMNIA   Yes Historical Provider, MD   Physical Exam: Filed Vitals:   08/12/12 1149 08/12/12 1505  BP: 142/89 177/90  Pulse: 67 68  Temp: 98.8 F (37.1 C)   TempSrc: Oral   Resp: 20 20  SpO2: 99% 100%   BP 177/90  Pulse 68  Temp 98.8 F (37.1 C) (Oral)  Resp 20  SpO2 100%  General Appearance:    Alert to person not able to follow commands  Head:    Normocephalic, without obvious abnormality, atraumatic           Throat:   Lips, mucosa, and tongue dry, poor oral hygiene.  Neck: Neck brace in place no appreciated JVD  Back:     Symmetric, no curvature, ROM normal, no CVA tenderness  Lungs:     Clear to auscultation bilaterally, respirations unlabored     Heart:    Regular rate and rhythm, S1 and S2 normal, no murmur, rub   or gallop  Abdomen:     Soft, non-tender, bowel sounds active all four quadrants,    no masses, no organomegaly        Extremities:   Extremities normal, atraumatic, no cyanosis or edema  Pulses:   2+ and symmetric all extremities  Skin:   Skin color, texture, turgor normal, no  rashes or lesions  Lymph nodes:   Cervical, supraclavicular, and axillary nodes normal  Neurologic:   Moving all 4 extremities with out any difficulties. Tracking movement with eyes no abnormalities. DTR +2.    Labs on Admission:  Basic Metabolic Panel:  Lab 08/12/12 9604  NA 142  K 4.7  CL 108  CO2 24  GLUCOSE 95  BUN 60*  CREATININE 2.38*  CALCIUM 9.9  MG --  PHOS --   Liver Function Tests: No results found for this basename: AST:5,ALT:5,ALKPHOS:5,BILITOT:5,PROT:5,ALBUMIN:5 in the last 168 hours No results found for this basename: LIPASE:5,AMYLASE:5 in the  last 168 hours No results found for this basename: AMMONIA:5 in the last 168 hours CBC:  Lab 08/12/12 1259  WBC 17.3*  NEUTROABS 15.0*  HGB 13.5  HCT 40.5  MCV 91.8  PLT 349   Cardiac Enzymes: No results found for this basename: CKTOTAL:5,CKMB:5,CKMBINDEX:5,TROPONINI:5 in the last 168 hours BNP: No components found with this basename: POCBNP:5 CBG: No results found for this basename: GLUCAP:5 in the last 168 hours  Radiological Exams on Admission: Dg Chest Port 1 View  08/12/2012  *RADIOLOGY REPORT*  Clinical Data: Increased labored breathing, history confusion, hypertension, Parkinson's, pneumonia, diabetes, MRSA  PORTABLE CHEST - 1 VIEW  Comparison: Portable exam 1315 hours compared to 06/12/2012  Findings: Borderline enlargement of cardiac silhouette. Tortuous aorta with atherosclerotic calcification. Pulmonary vascularity normal. Lungs clear. No pleural effusion or pneumothorax. Multiple old left rib fractures.  IMPRESSION: No acute abnormalities.   Original Report Authenticated By: Lollie Marrow, M.D.     EKG: Independently reviewed. SR left axis deviation.  Assessment/Plan: Principal Problem: Urinary tract infection/Chronic indwelling foley catheter -Likely cause of his encephalopathy would be his urinary tract infection. I will continue Rocephin started here in the ED. He has an indwelling catheter which  is most likely contributing to his urinary tract infection. We'll get urine cultures and blood cultures. Blood cultures were drawn after the antibiotics were given. He does have a history of MRSA bacteremia and UTI. He is was on vancomycin for 3 weeks. Which was likely stopped as the patient does not have a PICC line.   Toxic encephalopathy: -This most likely secondary to his urinary tract infection. At this time we'll place him n.p.o. we'll check a lithium level as this may be contributing to his encephalopathy. Place the patient n.p.o. as there is a high risk of aspiration until his mentation is clear.  Acute kidney injury: -This is most likely secondary to his urinary tract infection and significant decreased intravascular volume, I will started him on aggressive IV fluids, his baseline creatinine is 1. basic metabolic panel in the morning.  Diabetes mellitus: -We'll hold all his oral medications start sliding scale sensitive. We'll place the patient n.p.o.  Bipolar 1 disorder: -We'll continue his home medications. Will hold lithium for now. Until we get a lithium level once her lithium levels back  We will make the decision to restart it.  Parkinson disease: The Sinemet as the patient is confused. We'll resume her mentation is clear.  Hypertension: -ACE Inhibitor and his diuretic. As he has significant decrease in his intravascular volume.  C2 cervical fracture: Neurosurgery was consulted by the ER to evaluate the need of the neck brace, testis was placed 6 weeks ago.  Time spend: Greater than 60 minutes Code Status: Full Family Communication: Sister Disposition Plan: To be determined  Marinda Elk, MD  [facility] Pager [phone]  If 7PM-7AM, please contact night-coverage [email or web address] Password [facility] 08/12/2012, 3:53 PM

## 2012-08-12 NOTE — Plan of Care (Signed)
Problem: Phase I Progression Outcomes Goal: Voiding-avoid urinary catheter unless indicated Outcome: Not Applicable Date Met:  08/12/12 Chronic foley cath

## 2012-08-12 NOTE — Progress Notes (Signed)
Positive MRSA swab confirmation from lab, positive MRSA swab order set initiated. Julien Nordmann [facility]

## 2012-08-12 NOTE — ED Notes (Signed)
EMS states that POA states patient has AMS for the last 2 days.  Pt usually walks and now is WCB and usually talks and not talking now.  CBG-143.  Pt has C2 fracture and is in c-collar

## 2012-08-12 NOTE — Progress Notes (Signed)
CRITICAL VALUE ALERT  Critical value received:  Lithium level 1.53  Date of notification: 08/12/12   Time of notification:  1810  Critical value read back:yes  Nurse who received alert:  Julien Nordmann, RN   MD notified (1st page):  Dr. Robb Matar text paged  Time of first page: 1815   Responding MD:  Dr. Robb Matar  Time MD responded: [phone]

## 2012-08-12 NOTE — ED Notes (Signed)
Family states as of last Thursday patient had been ambulating with assistance and appropriately could talk and answer questions.  Family states that patient prone to urinary tract infections due to indwelling cath, patient delayed at this time with answers to question , patient oriented to name, patient will follow basic commands, patient does have intermittant episodes of incoherent speech.

## 2012-08-12 NOTE — ED Provider Notes (Addendum)
History     CSN: [id number]  Arrival date & time 08/12/12  1140   First MD Initiated Contact with Patient 08/12/12 1213      Chief Complaint  Patient presents with  . Altered Mental Status    (Consider location/radiation/quality/duration/timing/severity/associated sxs/prior treatment) The history is provided by the patient, a relative and the spouse.   77 year old, male, in an assisted living facility.  Presents to emergency department with increased weakness, and confusion.  His ex-wife, who has medical power of attorney states that usually he walks, with a walker, but he has not been able to do so, since last Wednesday, which is 6.  Days ago.  He has also been confused and progressively weak.  He denies pain anywhere.  He denies nausea, or vomiting.  He has not had a cough, chest pain, or shortness breath.  He has not had fevers, or chills.  He does have a indwelling Foley catheter.  His ex-wife.  States that his behavior today is similar to what he was like when he had prior urinary tract infection  Past Medical History  Diagnosis Date  . Hearing aid worn   . Hypertension   . Parkinson disease   . Pneumonia   . Diabetes mellitus type II   . Arthritis   . Bipolar disorder   . Anxiety   . Depression   . MRSA infection (methicillin-resistant Staphylococcus aureus)   . Chronic indwelling foley catheter   . C2 cervical fracture     due to pt fall  . SIRS (systemic inflammatory response syndrome)   . Neuromuscular disorder     Past Surgical History  Procedure Date  . Total hip arthroplasty     Family History  Problem Relation Age of Onset  . Depression Father     History  Substance Use Topics  . Smoking status: Never Smoker   . Smokeless tobacco: Never Used  . Alcohol Use: Yes     occasional      Review of Systems  Constitutional: Negative for fever, chills and diaphoresis.  HENT: Negative for neck pain.   Respiratory: Negative for cough, chest tightness and  shortness of breath.   Cardiovascular: Negative for chest pain.  Gastrointestinal: Negative for nausea, vomiting, abdominal pain and diarrhea.  Musculoskeletal: Negative for back pain.  Skin: Negative for rash.  Neurological: Positive for weakness. Negative for headaches.  Psychiatric/Behavioral: Positive for confusion.  All other systems reviewed and are negative.    Allergies  Cholestatin  Home Medications   Current Outpatient Rx  Name Route Sig Dispense Refill  . AMLODIPINE BESYLATE 5 MG PO TABS Oral Take 5 mg by mouth daily.      . ASPIRIN 81 MG PO TABS Oral Take 81 mg by mouth daily.      Marland Kitchen CARBIDOPA-LEVODOPA 25-250 MG PO TABS Oral Take 1 tablet by mouth 3 (three) times daily.      Marland Kitchen ENTACAPONE 200 MG PO TABS Oral Take 200 mg by mouth 3 (three) times daily.     Marland Kitchen HYDROCHLOROTHIAZIDE 25 MG PO TABS Oral Take 25 mg by mouth daily. HOLD FOR SBP<110    . LAMOTRIGINE 150 MG PO TABS Oral Take 150 mg by mouth 2 (two) times daily.    Marland Kitchen LITHIUM CARBONATE 300 MG PO CAPS Oral Take 300 mg by mouth 2 (two) times daily with a meal.    . LORAZEPAM 0.5 MG PO TABS Oral Take 0.5 mg by mouth at bedtime.    Marland Kitchen  METFORMIN HCL 500 MG PO TABS Oral Take 500 mg by mouth 2 (two) times daily with a meal.    . THERA M PLUS PO TABS Oral Take 1 tablet by mouth daily.      Marland Kitchen OMEPRAZOLE 20 MG PO CPDR Oral Take 20 mg by mouth every morning.     Marland Kitchen POTASSIUM CHLORIDE CRYS ER 20 MEQ PO TBCR Oral Take 20 mEq by mouth daily.    Marland Kitchen RAMIPRIL 5 MG PO TABS Oral Take 5 mg by mouth 2 (two) times daily. HOLD FOR SBP <110    . RED YEAST RICE PO Oral Take 2 tablets by mouth at bedtime.      . SERTRALINE HCL 100 MG PO TABS Oral Take 100 mg by mouth daily.    Marland Kitchen VANCOMYCIN 750 MG IVPB Intravenous Inject 750 mg into the vein every 12 (twelve) hours.    Marland Kitchen TEMAZEPAM 30 MG PO CAPS Oral Take 30 mg by mouth at bedtime as needed. FOR INSOMNIA      BP 142/89  Pulse 67  Temp 98.8 F (37.1 C) (Oral)  Resp 20  SpO2 99%  Physical  Exam  Nursing note and vitals reviewed. Constitutional: He is oriented to person, place, and time.       Cachectic elderly male, in no distress  HENT:  Head: Normocephalic and atraumatic.       Dry oral mucosa  Eyes: Conjunctivae and EOM are normal. Pupils are equal, round, and reactive to light.  Neck:       Immobilized in c-collar  Cardiovascular: Normal rate and normal heart sounds.   No murmur heard. Pulmonary/Chest: Effort normal and breath sounds normal. No respiratory distress.  Abdominal: Soft. Bowel sounds are normal. He exhibits no distension.  Musculoskeletal: Normal range of motion. He exhibits no edema and no tenderness.  Neurological: He is alert and oriented to person, place, and time.  Skin: Skin is warm and dry.  Psychiatric: He has a normal mood and affect. Thought content normal.    ED Course  Procedures (including critical care time) 77 year old, male, with indwelling catheter, presents emergency department with progressive weakness, and confusion.  According to his ex-wife.  He is in no distress, but he does have dry.  Oral, mucosa.  I'm concerned that he is dehydrated.  An etiology and may be a urinary tract infection.  We'll perform a chest x-ray, and laboratory testing, and treat him with IV fluids Labs Reviewed - No data to display No results found.   No diagnosis found.  2:30 PM Spoke with [facility].  They will admit for tx of uti and dehyrdration  Spoke with Dr. Wynetta Emery.  He said we CANNOT take off collar.  Pt has c-2 fx  MDM  Weakness UTI Dehydration Neck fracture       Cheri Guppy, MD 08/12/12 1339  Cheri Guppy, MD 08/13/12 [phone]

## 2012-08-12 NOTE — Progress Notes (Signed)
[name] [id number] Admitted to 5530: 08/12/2012  1630 Attending Provider: Marinda Elk, MD    [name]. is a 77 y.o. male patient admitted from ED awake, alert  & orientated  X 3,  Prior, VSS - Blood pressure 162/80, pulse 60, temperature 98.2 F (36.8 C), temperature source Oral, resp. rate 20, height 5\' 10"  (1.778 m), weight 61.1 kg (134 lb 11.2 oz), SpO2 97.00%., O2 RA no c/o shortness of breath, no c/o chest pain, no distress noted. .   IV site WDL:  forearm left, condition patent and no redness with a transparent dsg that's clean dry and intact.  Allergies:   Allergies  Allergen Reactions  . Cholestatin Other (See Comments)    Per [facility]     Past Medical History  Diagnosis Date  . Hearing aid worn   . Hypertension   . Parkinson disease   . Pneumonia   . Diabetes mellitus type II   . Arthritis   . Bipolar disorder   . Anxiety   . Depression   . MRSA infection (methicillin-resistant Staphylococcus aureus)   . Chronic indwelling foley catheter   . C2 cervical fracture     due to pt fall  . SIRS (systemic inflammatory response syndrome)   . Neuromuscular disorder     History:  obtained from hired caregiver and chart.  Pt orientation to unit, room and routine. Information packet given to patient/family and safety video watched.  Admission INP armband ID verified with patient/family, and in place. SR up x 2, fall risk assessment complete with caregiver verbalizing understanding of risks associated with falls. Caregiver and Pt verbalize an understanding of how to use the call bell and to call for help before getting out of bed.  Skin, clean and dry with stage II 1x1 to mid lower back, stage II to mid lower back 1x 0.5 and Stage II to left buttock 1x 0.5 and a stage one surronding  Left buttock wound. Skin care orders initiated.  Pt also has scattered scabs to BLE at varying stages of healing.    Will cont to monitor and assist as needed.  [name] [facility], RN 08/12/2012 5:48 PM

## 2012-08-12 NOTE — Progress Notes (Signed)
Disposition Note  [name], is a 77 y.o. male,   MRN: [id number]  -  DOB - 1935/09/22  Outpatient Primary MD for the patient is MOREIRA,ROY, MD   Blood pressure 142/89, pulse 67, temperature 98.8 F (37.1 C), temperature source Oral, resp. rate 20, SpO2 99.00%.  Active Problems:  Acute renal failure  Urinary tract infection  C2 cervical fracture  Dehydration  Diabetes mellitus  Hearing aid worn  Bipolar 1 disorder  Parkinson disease  Hypertension  Bipolar disorder  Anxiety  Chronic indwelling foley catheter  Neuromuscular disorder    77 yo male from Rehab facility who has a C2 fracture (His collar must stay on per Neuro Surgeon Dr. Wynetta Emery).  He has an indwelling foley catheter.  Presents with increased weakness and a progressive inability to walk since Wednesday (8/21).  Patient can normally walk with walker.  Found to have UTI, Acute Renal Failure and dehydration.  Urine culture ordered in ED, Antibiotics ordered in ED.  Per EDP he is completely stable.  I will request a med surg bed.   [name], PA-C [facility] Pager: [phone]

## 2012-08-13 DIAGNOSIS — Z9889 Other specified postprocedural states: Secondary | ICD-10-CM

## 2012-08-13 DIAGNOSIS — E870 Hyperosmolality and hypernatremia: Secondary | ICD-10-CM | POA: Diagnosis present

## 2012-08-13 LAB — GLUCOSE, CAPILLARY: Glucose-Capillary: 176 mg/dL — ABNORMAL HIGH (ref 70–99)

## 2012-08-13 LAB — COMPREHENSIVE METABOLIC PANEL
ALT: 7 U/L (ref 0–53)
AST: 10 U/L (ref 0–37)
Albumin: 3.1 g/dL — ABNORMAL LOW (ref 3.5–5.2)
Alkaline Phosphatase: 96 U/L (ref 39–117)
Glucose, Bld: 120 mg/dL — ABNORMAL HIGH (ref 70–99)
Potassium: 4.3 mEq/L (ref 3.5–5.1)
Sodium: 146 mEq/L — ABNORMAL HIGH (ref 135–145)
Total Protein: 5.6 g/dL — ABNORMAL LOW (ref 6.0–8.3)

## 2012-08-13 LAB — CBC
HCT: 37.7 % — ABNORMAL LOW (ref 39.0–52.0)
Hemoglobin: 12.1 g/dL — ABNORMAL LOW (ref 13.0–17.0)
MCH: 29.9 pg (ref 26.0–34.0)
MCHC: 32.1 g/dL (ref 30.0–36.0)
MCV: 93.1 fL (ref 78.0–100.0)
RDW: 14 % (ref 11.5–15.5)

## 2012-08-13 LAB — HEMOGLOBIN A1C: Mean Plasma Glucose: 100 mg/dL (ref <117)

## 2012-08-13 MED ORDER — LORAZEPAM 2 MG/ML IJ SOLN
0.5000 mg | Freq: Once | INTRAMUSCULAR | Status: AC
  Administered 2012-08-13: 0.5 mg via INTRAVENOUS
  Filled 2012-08-13: qty 1

## 2012-08-13 MED ORDER — DEXTROSE 5 % IV SOLN
INTRAVENOUS | Status: DC
  Administered 2012-08-13 (×2): via INTRAVENOUS
  Administered 2012-08-13: 125 mL via INTRAVENOUS
  Administered 2012-08-14 (×2): via INTRAVENOUS

## 2012-08-13 NOTE — Clinical Social Work Psychosocial (Signed)
     Clinical Social Work Department BRIEF PSYCHOSOCIAL ASSESSMENT 08/13/2012  Patient:  Dwayne, Carpenter     Account Number:  [id number]     Admit date:  08/12/2012  Clinical Social Worker:  Jacelyn Grip  Date/Time:  08/13/2012 02:00 PM  Referred by:  Physician  Date Referred:  08/13/2012 Referred for  ALF Placement   Other Referral:   Interview type:  Family Other interview type:    PSYCHOSOCIAL DATA Living Status:  FACILITY Admitted from facility:  [name] Level of care:  Assisted Living Primary support name:  [name]/HCPOA/[id number] Primary support relationship to patient:  FAMILY Degree of support available:   adequate    CURRENT CONCERNS Current Concerns  Post-Acute Placement   Other Concerns:    SOCIAL WORK ASSESSMENT / PLAN CSW received notification from MD that pt admitted from [facility]. CSW reviewed chart and noted that pt oriented to person only. CSW contacted pt HCPOA, [name] to discuss discharge planning. Pt HCPOA confirmed that pt is a resident at [facility]. Pt HCPOA stated that she anticipates pt will need some PT at discharge, but plans for pt to return to [facility] and pt to have PT at ALF. Pt HCPOA discussed that prior to admission, pt was ambulating with a walker. CSW contacted [name] and left message. CSW to continue to follow and assist with pt transfer back to [facility] with PT/OT at ALF.   Assessment/plan status:  Psychosocial Support/Ongoing Assessment of Needs Other assessment/ plan:   discharge planning   Information/referral to community resources:   Referral back to [name]    PATIENTS/FAMILYS RESPONSE TO PLAN OF CARE: Per chart, pt oriented to person only. Pt HCPOA discussed that pt has been a resident at [facility] for two weeks and admitted to ALF from rehab at [facility]. Pt HCPOA is hopeful for pt to return to [facility] and PT/OT  be initiated at ALF.

## 2012-08-13 NOTE — Progress Notes (Signed)
[facility] PROGRESS NOTE  [name]. ZOX:[id number] DOB: [birth year] DOA: 08/12/2012   Assessment/Plan:  Urinary tract infection/Chronic indwelling foley catheter  -Likely cause of his encephalopathy would be his urinary tract infection.  - Started 8/26  Rocephin  -urine cultures and blood cultures pending. Blood cultures were drawn after the antibiotics were given.  Toxic encephalopathy:  -This most likely secondary to his urinary tract infection.  -lithium level mildly high maybe contributing to his encephalopathy.  -Continue patient n.p.o. as there is a high risk of aspiration until his mentation is clear.   Acute kidney injury:  -aggressive IV fluids, his baseline creatinine is 1. basic metabolic panel in the morning.   Hypernatremia: -change IV fluids to D5, check Urine osm and serum, unlikely DI. Urine very concentrated -b-met in am  Diabetes mellitus:  -We'll hold all his oral medications start sliding scale sensitive. We'll place the patient n.p.o.   Bipolar 1 disorder:  -We'll continue his home medications. Will hold lithium for now.   Parkinson disease:  The Sinemet as the patient is confused. We'll resume her mentation is clear.   Hypertension:  -Continue to hold ACE Inhibitor and his diuretic.   C2 cervical fracture:  Neurosurgery was consulted by the ER to evaluate the need of the neck brace, was placed 6 weeks ago.   Code Status: Full  Family Communication: Sister  Disposition Plan: To be determined   LOS: 1 day   Antibiotics:  rocephin    Subjective: Still confused but now patient is able to answer yes and no questions  Objective: Filed Vitals:   08/12/12 1530 08/12/12 1641 08/12/12 2134 08/13/12 0548  BP: 161/80 162/80 158/91 168/82  Pulse:  60 55 63  Temp:  98.2 F (36.8 C) 98.6 F (37 C) 97.9 F (36.6 C)  TempSrc:  Oral Oral Oral  Resp:  20 20 18   Height:  5\' 10"  (1.778 m)    Weight:  61.1 kg (134 lb 11.2 oz)    SpO2:   97% 100% 98%    Intake/Output Summary (Last 24 hours) at 08/13/12 1022 Last data filed at 08/13/12 0554  Gross per 24 hour  Intake 1288.33 ml  Output    550 ml  Net 738.33 ml   Weight change:   Exam:  General: Alert, awake, oriented x1, in no acute distress.  HEENT: No bruits, no goiter. Neck brace Heart: Regular rate and rhythm, without murmurs, rubs, gallops.  Lungs: Good air movement, bilateral air movement.  Abdomen: Soft, nontender, nondistended, positive bowel sounds.     Data Reviewed: Basic Metabolic Panel:  Lab 08/13/12 8119 08/12/12 1658 08/12/12 1259  NA 146* -- 142  K 4.3 -- 4.7  CL 114* -- 108  CO2 18* -- 24  GLUCOSE 120* -- 95  BUN 57* -- 60*  CREATININE 2.63* 2.25* 2.38*  CALCIUM 9.4 -- 9.9  MG -- -- --  PHOS -- -- --   Liver Function Tests:  Lab 08/13/12 0610  AST 10  ALT 7  ALKPHOS 96  BILITOT 0.4  PROT 5.6*  ALBUMIN 3.1*   No results found for this basename: LIPASE:5,AMYLASE:5 in the last 168 hours No results found for this basename: AMMONIA:5 in the last 168 hours CBC:  Lab 08/13/12 0610 08/12/12 1658 08/12/12 1259  WBC 13.9* 15.8* 17.3*  NEUTROABS -- -- 15.0*  HGB 12.1* 12.2* 13.5  HCT 37.7* 36.7* 40.5  MCV 93.1 92.0 91.8  PLT 335 338 349   Cardiac  Enzymes: No results found for this basename: CKTOTAL:5,CKMB:5,CKMBINDEX:5,TROPONINI:5 in the last 168 hours BNP: No components found with this basename: POCBNP:5 CBG:  Lab 08/13/12 0735 08/13/12 0342 08/12/12 2326 08/12/12 2044 08/12/12 1639  GLUCAP 100* 117* 106* 128* 136*    Recent Results (from the past 240 hour(s))  CULTURE, BLOOD (ROUTINE X 2)     Status: Normal (Preliminary result)   Collection Time   08/12/12  3:47 PM      Component Value Range Status Comment   Specimen Description BLOOD FOREARM RIGHT   Final    Special Requests BOTTLES DRAWN AEROBIC AND ANAEROBIC 10CC   Final    Culture  Setup Time 08/12/2012 23:39   Final    Culture     Final    Value:        BLOOD  CULTURE RECEIVED NO GROWTH TO DATE CULTURE WILL BE HELD FOR 5 DAYS BEFORE ISSUING A FINAL NEGATIVE REPORT   Report Status PENDING   Incomplete   CULTURE, BLOOD (ROUTINE X 2)     Status: Normal (Preliminary result)   Collection Time   08/12/12  3:52 PM      Component Value Range Status Comment   Specimen Description BLOOD HAND RIGHT   Final    Special Requests BOTTLES DRAWN AEROBIC ONLY [facility]   Final    Culture  Setup Time 08/12/2012 23:41   Final    Culture     Final    Value:        BLOOD CULTURE RECEIVED NO GROWTH TO DATE CULTURE WILL BE HELD FOR 5 DAYS BEFORE ISSUING A FINAL NEGATIVE REPORT   Report Status PENDING   Incomplete   MRSA PCR SCREENING     Status: Abnormal   Collection Time   08/12/12  5:06 PM      Component Value Range Status Comment   MRSA by PCR POSITIVE (*) NEGATIVE Final      Studies: Dg Chest Port 1 View  08/12/2012  *RADIOLOGY REPORT*  Clinical Data: Increased labored breathing, history confusion, hypertension, Parkinson's, pneumonia, diabetes, MRSA  PORTABLE CHEST - 1 VIEW  Comparison: Portable exam 1315 hours compared to 06/12/2012  Findings: Borderline enlargement of cardiac silhouette. Tortuous aorta with atherosclerotic calcification. Pulmonary vascularity normal. Lungs clear. No pleural effusion or pneumothorax. Multiple old left rib fractures.  IMPRESSION: No acute abnormalities.   Original Report Authenticated By: Lollie Marrow, M.D.     Scheduled Meds:   . sodium chloride   Intravenous STAT  . antiseptic oral rinse  15 mL Mouth Rinse q12n4p  . aspirin  81 mg Oral Daily  . cefTRIAXone (ROCEPHIN)  IV  1 g Intravenous Once  . chlorhexidine  15 mL Mouth Rinse BID  . Chlorhexidine Gluconate Cloth  6 each Topical Q0600  . heparin  5,000 Units Subcutaneous Q8H  . insulin aspart  0-9 Units Subcutaneous Q4H  . LORazepam  0.5 mg Oral QHS  . mupirocin ointment  1 application Nasal BID  . pantoprazole  40 mg Oral Q1200  . pneumococcal 23 valent vaccine  0.5 mL  Intramuscular Tomorrow-1000  . sodium chloride  500 mL Intravenous Once  . DISCONTD: aspirin  81 mg Oral Daily  . DISCONTD: cefTRIAXone (ROCEPHIN) IM  1 g Intramuscular Once  . DISCONTD: insulin aspart  0-9 Units Subcutaneous TID WC  . DISCONTD: insulin aspart  0-9 Units Subcutaneous TID WC   Continuous Infusions:   . dextrose 75 mL/hr at 08/13/12 0944  . DISCONTD: sodium  chloride 100 mL/hr at 08/12/12 2334    Lambert Keto, MD  [facility] Pager [phone]  If 7PM-7AM, please contact night-coverage [email or web address] Password [facility] 08/13/2012, 10:22 AM

## 2012-08-13 NOTE — Evaluation (Signed)
Physical Therapy Evaluation Patient Details Name: [name]. MRN: [id number] DOB: 11-03-1935 Today's Date: 08/13/2012 Time: 4098-1191 PT Time Calculation (min): 20 min  PT Assessment / Plan / Recommendation Clinical Impression  Pt admitted from [facility] with UTI and encephalopathy. Sitter present throughout session and spoke to pt dgtr Eber Jones on the phone. Pt had been at [facility] after last hospital admit and at [facility] for the last 2wks. Per dgtr she is paying for sitters 8hr/day at [facility] and there is no other option at discharge other than to return pt to ALF with potentially increased assist but she was adamant that no other facilities or SNF would be ok. Pt with impaired cognition and limited verbal interaction today. Pt at this time would need supervision during all waking hours, bed alarm for night to prevent falls should he attempt to mobilize at night and HHPT. Pt will benefit from acute therapy to maximize activity, transfers, gait and function prior to return to ALF with increased supervision to decrease burden of care.     PT Assessment  Patient needs continued PT services    Follow Up Recommendations  Home health PT;Supervision for mobility/OOB    Barriers to Discharge        Equipment Recommendations  None recommended by PT    Recommendations for Other Services     Frequency Min 3X/week    Precautions / Restrictions Precautions Precautions: Fall Precaution Comments: pt with cervical collar due to C2 fx 6wks ago   Pertinent Vitals/Pain Pt unable to state pain, no visible signs of pain or distress      Mobility  Bed Mobility Bed Mobility: Rolling Left;Left Sidelying to Sit Rolling Left: 4: Min assist Left Sidelying to Sit: HOB flat;3: Mod assist Details for Bed Mobility Assistance: cueing for sequence and assist to initiate rolling and side to sit with assist to maintain legs off EOB and elevate trunk from  surface Transfers Transfers: Sit to Stand;Stand to Sit Sit to Stand: From bed;4: Min assist Stand to Sit: To bed;To chair/3-in-1;4: Min assist;With armrests Details for Transfer Assistance: pt stood x 2 from bed with cueing for hand placement, anterior translation and safety. Increased time and cueing to flex knees and hips to sit to chair on second trial with hand over hand placement for use of armrests Ambulation/Gait Ambulation/Gait Assistance: 4: Min assist Ambulation Distance (Feet): 4 Feet Assistive device: 1 person hand held assist Ambulation/Gait Assistance Details: cueing for posture and safety with ambulation in room, pt required assist with changing direction and denied further ambulation Gait Pattern: Shuffle;Trunk flexed;Narrow base of support Gait velocity: decreased Stairs: No    Exercises     PT Diagnosis: Abnormality of gait;Altered mental status  PT Problem List: Decreased activity tolerance;Decreased balance;Decreased mobility;Decreased coordination;Decreased cognition PT Treatment Interventions: Gait training;DME instruction;Functional mobility training;Therapeutic activities;Therapeutic exercise;Balance training;Patient/family education;Cognitive remediation   PT Goals Acute Rehab PT Goals PT Goal Formulation: With patient/family Time For Goal Achievement: 08/27/12 Potential to Achieve Goals: Fair Pt will go Supine/Side to Sit: with supervision;with HOB 0 degrees PT Goal: Supine/Side to Sit - Progress: Goal set today Pt will go Sit to Supine/Side: with supervision;with HOB 0 degrees PT Goal: Sit to Supine/Side - Progress: Goal set today Pt will go Sit to Stand: with supervision PT Goal: Sit to Stand - Progress: Goal set today Pt will go Stand to Sit: with supervision PT Goal: Stand to Sit - Progress: Goal set today Pt will Ambulate: 51 -  150 feet;with least restrictive assistive device;with min assist PT Goal: Ambulate - Progress: Goal set today  Visit  Information  Last PT Received On: 08/13/12 Assistance Needed: +1    Subjective Data  Subjective: "what?" Patient Stated Goal: pt unable to state, dgtr Eber Jones wants pt to return to [employer]   Prior Functioning  Home Living Lives With: Other (Comment) Available Help at Discharge: Other (Comment) (sitter 8hrs/day) Type of Home: Assisted living Home Access: Level entry Home Layout: One level Bathroom Shower/Tub: Health visitor: Standard Home Adaptive Equipment: Walker - rolling;Wheelchair - manual Prior Function Level of Independence: Needs assistance Needs Assistance: Bathing;Dressing;Light Housekeeping;Meal Prep;Transfers;Gait Bath: Moderate Dressing: Moderate Meal Prep: Total Light Housekeeping: Total Gait Assistance: supervision with RW max 150' at ALF Transfer Assistance: per dgtr at times he needs min assist to initiate transfers for safety and at others will be impulsive performing on his own and need supervision for safety Able to Take Stairs?: No Driving: No Vocation: Retired Musician: Other (comment) (pt with cognitive deficits and limited vocalization)    Cognition  Overall Cognitive Status: Impaired Area of Impairment: Following commands Arousal/Alertness: Awake/alert Orientation Level: Other (comment) (pt would not answer orientation questions) Behavior During Session: Flat affect Following Commands: Follows one step commands inconsistently Cognition - Other Comments: per dgtr pt is generally able to carry on a conversation and is usually oriented to person, place and situation. Pt with limited verbal response to questions and would not reply to orientation questions    Extremity/Trunk Assessment Right Lower Extremity Assessment RLE ROM/Strength/Tone: [facility] for tasks assessed;Unable to fully assess;Due to impaired cognition Left Lower Extremity Assessment LLE ROM/Strength/Tone: [facility] for tasks assessed;Unable to fully  assess;Due to impaired cognition Trunk Assessment Trunk Assessment: Kyphotic   Balance Static Sitting Balance Static Sitting - Balance Support: No upper extremity supported;Feet supported Static Sitting - Level of Assistance: 5: Stand by assistance Static Sitting - Comment/# of Minutes: 4 Static Standing Balance Static Standing - Balance Support: Right upper extremity supported Static Standing - Level of Assistance: 5: Stand by assistance Static Standing - Comment/# of Minutes: 1  End of Session PT - End of Session Equipment Utilized During Treatment: Gait belt;Cervical collar Activity Tolerance: Patient tolerated treatment well Patient left: in chair;with call bell/phone within reach;with family/visitor present;Other (comment) (bil mittens in place) Nurse Communication: Mobility status  GP     Delorse Lek 08/13/2012, 4:25 PM  Delaney Meigs, PT [phone]

## 2012-08-14 ENCOUNTER — Ambulatory Visit (HOSPITAL_COMMUNITY): Payer: Self-pay | Admitting: Psychiatry

## 2012-08-14 LAB — CBC
Hemoglobin: 12 g/dL — ABNORMAL LOW (ref 13.0–17.0)
Platelets: 364 10*3/uL (ref 150–400)
RBC: 3.89 MIL/uL — ABNORMAL LOW (ref 4.22–5.81)
WBC: 12.5 10*3/uL — ABNORMAL HIGH (ref 4.0–10.5)

## 2012-08-14 LAB — BASIC METABOLIC PANEL
Calcium: 9.2 mg/dL (ref 8.4–10.5)
GFR calc Af Amer: 26 mL/min — ABNORMAL LOW (ref >90)
GFR calc non Af Amer: 22 mL/min — ABNORMAL LOW (ref >90)
Glucose, Bld: 138 mg/dL — ABNORMAL HIGH (ref 70–99)
Potassium: 3.7 mEq/L (ref 3.5–5.1)
Sodium: 138 mEq/L (ref 135–145)

## 2012-08-14 LAB — GLUCOSE, CAPILLARY
Glucose-Capillary: 151 mg/dL — ABNORMAL HIGH (ref 70–99)
Glucose-Capillary: 125 mg/dL — ABNORMAL HIGH (ref 70–99)
Glucose-Capillary: 143 mg/dL — ABNORMAL HIGH (ref 70–99)
Glucose-Capillary: 108 mg/dL — ABNORMAL HIGH (ref 70–99)
Glucose-Capillary: 117 mg/dL — ABNORMAL HIGH (ref 70–99)

## 2012-08-14 LAB — URINE CULTURE: Colony Count: >=100000

## 2012-08-14 LAB — LITHIUM LEVEL: Lithium Lvl: 1.14 mEq/L (ref 0.80–1.40)

## 2012-08-14 MED ORDER — LAMOTRIGINE 150 MG PO TABS
150.0000 mg | ORAL_TABLET | Freq: Two times a day (BID) | ORAL | Status: DC
  Administered 2012-08-14 – 2012-08-17 (×4): 150 mg via ORAL
  Filled 2012-08-14 (×7): qty 1

## 2012-08-14 MED ORDER — AMLODIPINE BESYLATE 5 MG PO TABS
5.0000 mg | ORAL_TABLET | Freq: Every day | ORAL | Status: DC
  Administered 2012-08-16 – 2012-08-17 (×2): 5 mg via ORAL
  Filled 2012-08-14 (×4): qty 1

## 2012-08-14 MED ORDER — DEXTROSE-NACL 5-0.9 % IV SOLN
INTRAVENOUS | Status: DC
  Administered 2012-08-14 – 2012-08-15 (×4): via INTRAVENOUS

## 2012-08-14 MED ORDER — CEFTRIAXONE SODIUM 1 G IJ SOLR
1.0000 g | INTRAMUSCULAR | Status: DC
  Administered 2012-08-14: 1 g via INTRAVENOUS
  Filled 2012-08-14 (×2): qty 10

## 2012-08-14 MED ORDER — SERTRALINE HCL 100 MG PO TABS
100.0000 mg | ORAL_TABLET | Freq: Every day | ORAL | Status: DC
  Administered 2012-08-16 – 2012-08-17 (×2): 100 mg via ORAL
  Filled 2012-08-14 (×4): qty 1

## 2012-08-14 MED ORDER — CARBIDOPA-LEVODOPA 25-250 MG PO TABS
1.0000 | ORAL_TABLET | Freq: Three times a day (TID) | ORAL | Status: DC
  Administered 2012-08-14 – 2012-08-17 (×5): 1 via ORAL
  Filled 2012-08-14 (×11): qty 1

## 2012-08-14 MED ORDER — ENTACAPONE 200 MG PO TABS
200.0000 mg | ORAL_TABLET | Freq: Three times a day (TID) | ORAL | Status: DC
  Administered 2012-08-14 – 2012-08-17 (×5): 200 mg via ORAL
  Filled 2012-08-14 (×11): qty 1

## 2012-08-14 NOTE — Progress Notes (Signed)
Physical Therapy Treatment Patient Details Name: [name]. MRN: [id number] DOB: [birth year] Today's Date: 08/14/2012 Time: 4098-1191 PT Time Calculation (min): 10 min  PT Assessment / Plan / Recommendation Comments on Treatment Session  Pt. much more lethargic today and less able to participate in session.      Follow Up Recommendations  Home health PT;Supervision for mobility/OOB    Barriers to Discharge        Equipment Recommendations  None recommended by PT    Recommendations for Other Services    Frequency Min 3X/week   Plan Discharge plan remains appropriate    Precautions / Restrictions Precautions Precautions: Fall Precaution Comments: pt with cervical collar due to C2 fx 6wks ago Restrictions Weight Bearing Restrictions: No   Pertinent Vitals/Pain No indication of pain, no distress    Mobility  Bed Mobility Bed Mobility: Rolling Right;Right Sidelying to Sit;Sit to Supine Rolling Right: 2: Max assist Right Sidelying to Sit: 1: +2 Total assist Right Sidelying to Sit: Patient Percentage: 30% Sit to Supine: 1: +2 Total assist Sit to Supine: Patient Percentage: 10% Details for Bed Mobility Assistance: cueing for each component of task; lethargic.  Sat EOB 5 min. with mod to min fluctuating support. Transfers Transfers: Not assessed Ambulation/Gait Ambulation/Gait Assistance: Not tested (comment)    Exercises     PT Diagnosis:    PT Problem List:   PT Treatment Interventions:     PT Goals Acute Rehab PT Goals PT Goal: Supine/Side to Sit - Progress: Progressing toward goal PT Goal: Sit to Supine/Side - Progress: Progressing toward goal  Visit Information  Last PT Received On: 08/14/12 Assistance Needed: +2    Subjective Data  Subjective: minimal vrbalizations due to lethargy   Cognition  Overall Cognitive Status: Impaired Area of Impairment: Following commands Arousal/Alertness: Lethargic Behavior During Session: Flat affect Following  Commands: Follows one step commands inconsistently    Balance  Static Sitting Balance Static Sitting - Balance Support: Bilateral upper extremity supported;Feet supported Static Sitting - Level of Assistance: 3: Mod assist;4: Min assist;Other (comment) ( fluctuating) Static Sitting - Comment/# of Minutes: 5  End of Session PT - End of Session Equipment Utilized During Treatment: Gait belt;Cervical collar Activity Tolerance: Other (comment) (limited due to cognitive satus) Patient left: in bed;with call bell/phone within reach;with bed alarm set;Other (comment) (bilateral mittens) Nurse Communication: Mobility status   GP     Ferman Hamming 08/14/2012, 10:58 AM Weldon Picking PT Acute Rehab Services [phone] [facility] [phone]

## 2012-08-14 NOTE — Progress Notes (Signed)
PATIENT DETAILS Name: [name]. Age: 77 y.o. Sex: male Date of Birth: December 25, 1934 Admit Date: 08/12/2012 Admitting Physician Hollice Espy, MD WUJ:[email or web address],WGN, MD  Subjective: Awake and alert-some abd pain this am  Assessment/Plan: Principal Problem:  *Toxic encephalopathy -likely 2/2 UTI -await Blood cultures-8/26 -urine cultures 8/26-contaminated-already on antibiotics-therefore no point in re-culturing -afebrile with downtrending leukocytosis-therefore will empirically continue with Rocephin  Active Problems: Acute Urinary retention -? 2/2 clot colic -bladder distended this am-foley changed, and now having almost 1 litre output-monitor  ARF -2/2 to above-obstructive uropathy -monitor lytes  Hypernatremia -resolved -resume oral intake   Diabetes mellitus -CBG's stable -c/w SSI, resume Metformin when renal function improves further  HTN -resume amlodipine -hold off on resuming ACEI and Diuretics till renal function improves further  Parkinson's -resume Sinemet and Entacapone   Bipolar 1 disorder -resume Zoloft -resume Lithium when renal function better  C2 cervical fracture:  Neurosurgery was consulted by the ER to evaluate the need of the neck brace, per family-patient needs this brace for another 8 weeks  Chronic Indwelling Foley Catheter -2/2 neurogenic bladder -followed by Dr Marline Backbone catheter being considered (per family)  Disposition: Remain inpatient  DVT Prophylaxis: Prophylactic Heparin  Code Status: Full code   CONSULTS:  None  PHYSICAL EXAM: Vital signs in last 24 hours: Filed Vitals:   08/13/12 0548 08/13/12 1411 08/13/12 2138 08/14/12 0409  BP: 168/82 160/80 176/72 135/70  Pulse: 63 60 72 56  Temp: 97.9 F (36.6 C) 98 F (36.7 C) 98.6 F (37 C) 97.3 F (36.3 C)  TempSrc: Oral Oral Axillary Axillary  Resp: 18 18 21 20   Height:      Weight:      SpO2: 98% 98% 98% 100%    Weight change:  Body mass  index is 19.33 kg/(m^2).   Gen Exam: Awake and alert with slow speech.  Neck: Supple, No JVD.   Chest: B/L Clear.   CVS: S1 S2 Regular, no murmurs.  Abdomen: soft, BS +, non tender,+ distended bladder Extremities: no edema, lower extremities warm to touch. Neurologic: Non Focal.   Skin: No Rash.   Wounds: N/A.    Intake/Output from previous day:  Intake/Output Summary (Last 24 hours) at 08/14/12 1351 Last data filed at 08/14/12 1320  Gross per 24 hour  Intake   2375 ml  Output   2200 ml  Net    175 ml     LAB RESULTS: CBC  Lab 08/14/12 0923 08/13/12 0610 08/12/12 1658 08/12/12 1259  WBC 12.5* 13.9* 15.8* 17.3*  HGB 12.0* 12.1* 12.2* 13.5  HCT 36.2* 37.7* 36.7* 40.5  PLT 364 335 338 349  MCV 93.1 93.1 92.0 91.8  MCH 30.8 29.9 30.6 30.6  MCHC 33.1 32.1 33.2 33.3  RDW 13.8 14.0 13.9 14.0  LYMPHSABS -- -- -- 1.1  MONOABS -- -- -- 1.1*  EOSABS -- -- -- 0.1  BASOSABS -- -- -- 0.0  BANDABS -- -- -- --    Chemistries   Lab 08/14/12 0923 08/13/12 0610 08/12/12 1658 08/12/12 1259  NA 138 146* -- 142  K 3.7 4.3 -- 4.7  CL 108 114* -- 108  CO2 19 18* -- 24  GLUCOSE 138* 120* -- 95  BUN 50* 57* -- 60*  CREATININE 2.63* 2.63* 2.25* 2.38*  CALCIUM 9.2 9.4 -- 9.9  MG -- -- -- --    CBG:  Lab 08/14/12 1247 08/14/12 0804 08/14/12 0412 08/14/12 08/13/12 2136  GLUCAP 143* 125* 151* 105* 176*  GFR Estimated Creatinine Clearance: 21 ml/min (by C-G formula based on Cr of 2.63).  Coagulation profile No results found for this basename: INR:5,PROTIME:5 in the last 168 hours  Cardiac Enzymes No results found for this basename: CK:3,CKMB:3,TROPONINI:3,MYOGLOBIN:3 in the last 168 hours  No components found with this basename: POCBNP:3 No results found for this basename: DDIMER:2 in the last 72 hours  Basename 08/12/12 1658  HGBA1C 5.1   No results found for this basename: CHOL:2,HDL:2,LDLCALC:2,TRIG:2,CHOLHDL:2,LDLDIRECT:2 in the last 72 hours No results found for  this basename: TSH,T4TOTAL,FREET3,T3FREE,THYROIDAB in the last 72 hours No results found for this basename: VITAMINB12:2,FOLATE:2,FERRITIN:2,TIBC:2,IRON:2,RETICCTPCT:2 in the last 72 hours No results found for this basename: LIPASE:2,AMYLASE:2 in the last 72 hours  Urine Studies No results found for this basename: UACOL:2,UAPR:2,USPG:2,UPH:2,UTP:2,UGL:2,UKET:2,UBIL:2,UHGB:2,UNIT:2,UROB:2,ULEU:2,UEPI:2,UWBC:2,URBC:2,UBAC:2,CAST:2,CRYS:2,UCOM:2,BILUA:2 in the last 72 hours  MICROBIOLOGY: Recent Results (from the past 240 hour(s))  URINE CULTURE     Status: Normal   Collection Time   08/12/12  1:06 PM      Component Value Range Status Comment   Specimen Description URINE, RANDOM   Final    Special Requests ADDED AT 1429 ON 0826 2013   Final    Culture  Setup Time 08/12/2012 17:22   Final    Colony Count >=100,000 COLONIES/ML   Final    Culture     Final    Value: Multiple bacterial morphotypes present, none predominant. Suggest appropriate recollection if clinically indicated.   Report Status 08/14/2012 FINAL   Final   CULTURE, BLOOD (ROUTINE X 2)     Status: Normal (Preliminary result)   Collection Time   08/12/12  3:47 PM      Component Value Range Status Comment   Specimen Description BLOOD FOREARM RIGHT   Final    Special Requests BOTTLES DRAWN AEROBIC AND ANAEROBIC 10CC   Final    Culture  Setup Time 08/12/2012 23:39   Final    Culture     Final    Value:        BLOOD CULTURE RECEIVED NO GROWTH TO DATE CULTURE WILL BE HELD FOR 5 DAYS BEFORE ISSUING A FINAL NEGATIVE REPORT   Report Status PENDING   Incomplete   CULTURE, BLOOD (ROUTINE X 2)     Status: Normal (Preliminary result)   Collection Time   08/12/12  3:52 PM      Component Value Range Status Comment   Specimen Description BLOOD HAND RIGHT   Final    Special Requests BOTTLES DRAWN AEROBIC ONLY [facility]   Final    Culture  Setup Time 08/12/2012 23:41   Final    Culture     Final    Value:        BLOOD CULTURE RECEIVED NO GROWTH  TO DATE CULTURE WILL BE HELD FOR 5 DAYS BEFORE ISSUING A FINAL NEGATIVE REPORT   Report Status PENDING   Incomplete   MRSA PCR SCREENING     Status: Abnormal   Collection Time   08/12/12  5:06 PM      Component Value Range Status Comment   MRSA by PCR POSITIVE (*) NEGATIVE Final     RADIOLOGY STUDIES/RESULTS: Dg Chest Port 1 View  08/12/2012  *RADIOLOGY REPORT*  Clinical Data: Increased labored breathing, history confusion, hypertension, Parkinson's, pneumonia, diabetes, MRSA  PORTABLE CHEST - 1 VIEW  Comparison: Portable exam 1315 hours compared to 06/12/2012  Findings: Borderline enlargement of cardiac silhouette. Tortuous aorta with atherosclerotic calcification. Pulmonary vascularity normal. Lungs clear. No pleural effusion or pneumothorax. Multiple old  left rib fractures.  IMPRESSION: No acute abnormalities.   Original Report Authenticated By: Lollie Marrow, M.D.     MEDICATIONS: Scheduled Meds:   . antiseptic oral rinse  15 mL Mouth Rinse q12n4p  . aspirin  81 mg Oral Daily  . chlorhexidine  15 mL Mouth Rinse BID  . Chlorhexidine Gluconate Cloth  6 each Topical Q0600  . heparin  5,000 Units Subcutaneous Q8H  . insulin aspart  0-9 Units Subcutaneous Q4H  . LORazepam  0.5 mg Intravenous Once  . LORazepam  0.5 mg Oral QHS  . mupirocin ointment  1 application Nasal BID  . pantoprazole  40 mg Oral Q1200   Continuous Infusions:   . dextrose 5 % and 0.9% NaCl    . DISCONTD: dextrose 125 mL/hr at 08/14/12 1141   PRN Meds:.ondansetron (ZOFRAN) IV, ondansetron, temazepam  Antibiotics: Anti-infectives     Start     Dose/Rate Route Frequency Ordered Stop   08/12/12 1445   cefTRIAXone (ROCEPHIN) 1 g in dextrose 5 % 50 mL IVPB        1 g 100 mL/hr over 30 Minutes Intravenous  Once 08/12/12 1439 08/12/12 1550   08/12/12 1430   cefTRIAXone (ROCEPHIN) injection 1 g  Status:  Discontinued        1 g Intramuscular  Once 08/12/12 1428 08/12/12 1439           Jeoffrey Massed,  MD  [facility] Pager:[phone]  If 7PM-7AM, please contact night-coverage [email or web address] Password [email or web address] 08/14/2012, 1:51 PM   LOS: 2 days

## 2012-08-14 NOTE — Progress Notes (Signed)
Bladder scan done result 1000cc,,Old foley removed.observed some blood clot.A new foley catheter put in 16 french,draining blood tinged urine.

## 2012-08-14 NOTE — Progress Notes (Addendum)
Talked to MD ,Dr Jerral Ralph that pt has been coughing a lot, Noticed a lot secretion,suction done as needed.but still pt continuously to cough,suggested MD to order Respiratory Protocol.Per  MD its Okay to hold on Per [place] medicine.And i also mention that  Patient is  possibly aspirating.

## 2012-08-14 NOTE — Progress Notes (Signed)
SLP Cancellation Note  Treatment cancelled today due to patient receiving procedure or test/ foley being replaced.  Spoke briefly with pt and POA.  Will f/u 8/29.  Blenda Mounts Laurice 08/14/2012, 4:05 PM

## 2012-08-15 ENCOUNTER — Inpatient Hospital Stay (HOSPITAL_COMMUNITY): Payer: Medicare Other

## 2012-08-15 LAB — BASIC METABOLIC PANEL
BUN: 33 mg/dL — ABNORMAL HIGH (ref 6–23)
Chloride: 112 mEq/L (ref 96–112)
GFR calc Af Amer: 43 mL/min — ABNORMAL LOW (ref >90)
GFR calc non Af Amer: 37 mL/min — ABNORMAL LOW (ref >90)
Potassium: 3.5 mEq/L (ref 3.5–5.1)
Sodium: 143 mEq/L (ref 135–145)

## 2012-08-15 LAB — GLUCOSE, CAPILLARY
Glucose-Capillary: 135 mg/dL — ABNORMAL HIGH (ref 70–99)
Glucose-Capillary: 64 mg/dL — ABNORMAL LOW (ref 70–99)
Glucose-Capillary: 65 mg/dL — ABNORMAL LOW (ref 70–99)

## 2012-08-15 LAB — CBC
HCT: 41.9 % (ref 39.0–52.0)
Hemoglobin: 13.5 g/dL (ref 13.0–17.0)
MCHC: 32.2 g/dL (ref 30.0–36.0)
RBC: 4.51 MIL/uL (ref 4.22–5.81)
WBC: 19 10*3/uL — ABNORMAL HIGH (ref 4.0–10.5)

## 2012-08-15 MED ORDER — DEXTROSE 50 % IV SOLN
25.0000 mL | Freq: Once | INTRAVENOUS | Status: AC | PRN
  Administered 2012-08-15: 25 mL via INTRAVENOUS

## 2012-08-15 MED ORDER — DEXTROSE 50 % IV SOLN
INTRAVENOUS | Status: AC
  Filled 2012-08-15: qty 50

## 2012-08-15 MED ORDER — LORAZEPAM 2 MG/ML IJ SOLN
0.5000 mg | Freq: Four times a day (QID) | INTRAMUSCULAR | Status: DC | PRN
  Administered 2012-08-15 – 2012-08-16 (×4): 0.5 mg via INTRAVENOUS
  Filled 2012-08-15 (×4): qty 1

## 2012-08-15 MED ORDER — PIPERACILLIN-TAZOBACTAM 3.375 G IVPB
3.3750 g | Freq: Three times a day (TID) | INTRAVENOUS | Status: DC
  Administered 2012-08-15 – 2012-08-17 (×6): 3.375 g via INTRAVENOUS
  Filled 2012-08-15 (×9): qty 50

## 2012-08-15 NOTE — Progress Notes (Signed)
Speech Language Pathology Treatment Patient Details Name: [name]. MRN: [id number] DOB: 07-May-1935 Today's Date: 08/15/2012 Time: 4098-1191 SLP Time Calculation (min): 17 min   SLP paged by RN to reassess swallow due to increase cooperation.  Pt. participatory and initiated a pharyngeal swallow with applesauce and water with significant signs of aspiration with cough, wet vocal quality and throat clear as well as delayed initiation.  Recommend pt. Continue NPO status with objective study to be completed next date.      [name] [place].Ed [employer] [phone] 08/15/2012

## 2012-08-15 NOTE — Evaluation (Signed)
SLP has reviewed and agrees with student's note below.  Bilbo Carcamo Willis Chance Karam M.Ed CCC-SLP Pager [phone]  08/15/2012  

## 2012-08-15 NOTE — Progress Notes (Signed)
ANTIBIOTIC CONSULT NOTE - INITIAL  Pharmacy Consult for Zosyn Indication: Aspiration [place]  Assessment: 33 YOM admitted for AMS, AKI, now with increased leukocytosis. Pt. Was on rocephin now switch to zosyn for suspected aspiration pneumonia. Scr trending down, est. crcl ~ 20ml/min   Plan:  - Zosyn 3.375g IV Q 8 (4 hr infusion) - f/u clinical course, renal function and cultures  Bayard Hugger, PharmD, BCPS  Clinical Pharmacist  Pager: [phone]   Allergies  Allergen Reactions  . Cholestatin Other (See Comments)    Per [facility]    Patient Measurements: Height: 5\' 10"  (177.8 cm) Weight: 134 lb 11.2 oz (61.1 kg) IBW/kg (Calculated) : 73   Vital Signs: Temp: 99.2 F (37.3 C) (08/29 0900) Temp src: Oral (08/29 0900) BP: 136/64 mmHg (08/29 1030) Pulse Rate: 95  (08/29 0900) Intake/Output from previous day: 08/28 0701 - 08/29 0700 In: 1410.8 [I.V.:1410.8] Out: 3800 [Urine:3800] Intake/Output from this shift:    Labs:  Basename 08/15/12 0545 08/14/12 0923 08/13/12 0610  WBC 19.0* 12.5* 13.9*  HGB 13.5 12.0* 12.1*  PLT 387 364 335  LABCREA -- -- --  CREATININE 1.71* 2.63* 2.63*   Estimated Creatinine Clearance: 32.3 ml/min (by C-G formula based on Cr of 1.71). No results found for this basename: VANCOTROUGH:2,VANCOPEAK:2,VANCORANDOM:2,GENTTROUGH:2,GENTPEAK:2,GENTRANDOM:2,TOBRATROUGH:2,TOBRAPEAK:2,TOBRARND:2,AMIKACINPEAK:2,AMIKACINTROU:2,AMIKACIN:2, in the last 72 hours   Microbiology: Recent Results (from the past 720 hour(s))  URINE CULTURE     Status: Normal   Collection Time   08/12/12  1:06 PM      Component Value Range Status Comment   Specimen Description URINE, RANDOM   Final    Special Requests ADDED AT 1429 ON 0826 2013   Final    Culture  Setup Time 08/12/2012 17:22   Final    Colony Count >=100,000 COLONIES/ML   Final    Culture     Final    Value: Multiple bacterial morphotypes present, none predominant. Suggest appropriate recollection if clinically  indicated.   Report Status 08/14/2012 FINAL   Final   CULTURE, BLOOD (ROUTINE X 2)     Status: Normal (Preliminary result)   Collection Time   08/12/12  3:47 PM      Component Value Range Status Comment   Specimen Description BLOOD FOREARM RIGHT   Final    Special Requests BOTTLES DRAWN AEROBIC AND ANAEROBIC 10CC   Final    Culture  Setup Time 08/12/2012 23:39   Final    Culture     Final    Value:        BLOOD CULTURE RECEIVED NO GROWTH TO DATE CULTURE WILL BE HELD FOR 5 DAYS BEFORE ISSUING A FINAL NEGATIVE REPORT   Report Status PENDING   Incomplete   CULTURE, BLOOD (ROUTINE X 2)     Status: Normal (Preliminary result)   Collection Time   08/12/12  3:52 PM      Component Value Range Status Comment   Specimen Description BLOOD HAND RIGHT   Final    Special Requests BOTTLES DRAWN AEROBIC ONLY [facility]   Final    Culture  Setup Time 08/12/2012 23:41   Final    Culture     Final    Value:        BLOOD CULTURE RECEIVED NO GROWTH TO DATE CULTURE WILL BE HELD FOR 5 DAYS BEFORE ISSUING A FINAL NEGATIVE REPORT   Report Status PENDING   Incomplete   MRSA PCR SCREENING     Status: Abnormal   Collection Time   08/12/12  5:06  PM      Component Value Range Status Comment   MRSA by PCR POSITIVE (*) NEGATIVE Final     Medical History: Past Medical History  Diagnosis Date  . Hearing aid worn   . Hypertension   . Parkinson disease   . Pneumonia   . Diabetes mellitus type II   . Arthritis   . Bipolar disorder   . Anxiety   . Depression   . MRSA infection (methicillin-resistant Staphylococcus aureus)   . Chronic indwelling foley catheter   . C2 cervical fracture     due to pt fall  . SIRS (systemic inflammatory response syndrome)   . Neuromuscular disorder      08/15/2012,11:23 AM

## 2012-08-15 NOTE — Progress Notes (Signed)
PATIENT DETAILS Name: [name]. Age: 77 y.o. Sex: male Date of Birth: 1935-06-19 Admit Date: 08/12/2012 Admitting Physician Hollice Espy, MD ZOX:[id number],UJW, MD  Subjective: Awake and alert this am-"not hungry"  Assessment/Plan: Principal Problem:  *Toxic encephalopathy -likely 2/2 UTI -await Blood cultures-8/26 -urine cultures 8/26-contaminated-already on antibiotics-therefore no point in re-culturing -afebrile-but WBC up today-?Aspiration PNA-will switch to Zosyn  Active Problems: Worsening Leukocytosis -?Asp PNA-per Nursing staff-patient coughing and pooling secretions -will switch over to Zosyn -CXR done today-reveiwed  ?Dysphagia -await ST eval  Acute Urinary retention -? 2/2 clot colic -bladder distended 8/28-foley changed, subsequently resolved  ARF -2/2 to above-obstructive uropathy -monitor lytes-creatinine down to 1.71  Hypernatremia -resolved -resume oral intake   Diabetes mellitus -CBG's stable -c/w SSI, resume Metformin when renal function improves further  HTN -resume amlodipine when oral intake resumes -hold off on resuming ACEI and Diuretics till renal function improves further  Parkinson's -resume Sinemet and Entacapone when oral intake resumes  Bipolar 1 disorder -resume Zoloft when oral intake resumes -resume Lithium when renal function better  C2 cervical fracture:  Neurosurgery was consulted by the ER to evaluate the need of the neck brace, per family-patient needs this brace for another 8 weeks  Chronic Indwelling Foley Catheter -2/2 neurogenic bladder -followed by Dr Marline Backbone catheter being considered (per family)  Disposition: Remain inpatient-back to ALF when ready  DVT Prophylaxis: Prophylactic Heparin  Code Status: Full code   CONSULTS:  None  PHYSICAL EXAM: Vital signs in last 24 hours: Filed Vitals:   08/14/12 0409 08/14/12 1500 08/14/12 2210 08/15/12 0438  BP: 135/70 100/72 146/78  132/78  Pulse: 56 65 59 63  Temp: 97.3 F (36.3 C) 97.4 F (36.3 C) 98.3 F (36.8 C) 99.3 F (37.4 C)  TempSrc: Axillary Oral Oral Oral  Resp: 20 18 20 24   Height:      Weight:      SpO2: 100% 100% 100% 96%    Weight change:  Body mass index is 19.33 kg/(m^2).   Gen Exam: Awake and alert with slow speech.  Neck: Supple, No JVD.   Chest: B/L Clear.   CVS: S1 S2 Regular, no murmurs.  Abdomen: soft, BS +, non tender,+ distended bladder Extremities: no edema, lower extremities warm to touch. Neurologic: Non Focal.   Skin: No Rash.   Wounds: N/A.    Intake/Output from previous day:  Intake/Output Summary (Last 24 hours) at 08/15/12 1115 Last data filed at 08/15/12 0900  Gross per 24 hour  Intake 1410.83 ml  Output   3800 ml  Net -2389.17 ml     LAB RESULTS: CBC  Lab 08/15/12 0545 08/14/12 0923 08/13/12 0610 08/12/12 1658 08/12/12 1259  WBC 19.0* 12.5* 13.9* 15.8* 17.3*  HGB 13.5 12.0* 12.1* 12.2* 13.5  HCT 41.9 36.2* 37.7* 36.7* 40.5  PLT 387 364 335 338 349  MCV 92.9 93.1 93.1 92.0 91.8  MCH 29.9 30.8 29.9 30.6 30.6  MCHC 32.2 33.1 32.1 33.2 33.3  RDW 13.6 13.8 14.0 13.9 14.0  LYMPHSABS -- -- -- -- 1.1  MONOABS -- -- -- -- 1.1*  EOSABS -- -- -- -- 0.1  BASOSABS -- -- -- -- 0.0  BANDABS -- -- -- -- --    Chemistries   Lab 08/15/12 0545 08/14/12 0923 08/13/12 0610 08/12/12 1658 08/12/12 1259  NA 143 138 146* -- 142  K 3.5 3.7 4.3 -- 4.7  CL 112 108 114* -- 108  CO2 22 19 18* -- 24  GLUCOSE  81 138* 120* -- 95  BUN 33* 50* 57* -- 60*  CREATININE 1.71* 2.63* 2.63* 2.25* 2.38*  CALCIUM 9.5 9.2 9.4 -- 9.9  MG -- -- -- -- --    CBG:  Lab 08/15/12 0745 08/15/12 0431 08/14/12 2349 08/14/12 1956 08/14/12 1741  GLUCAP 96 140* 135* 117* 108*    GFR Estimated Creatinine Clearance: 32.3 ml/min (by C-G formula based on Cr of 1.71).  Coagulation profile No results found for this basename: INR:5,PROTIME:5 in the last 168 hours  Cardiac Enzymes No results  found for this basename: CK:3,CKMB:3,TROPONINI:3,MYOGLOBIN:3 in the last 168 hours  No components found with this basename: POCBNP:3 No results found for this basename: DDIMER:2 in the last 72 hours  Basename 08/12/12 1658  HGBA1C 5.1   No results found for this basename: CHOL:2,HDL:2,LDLCALC:2,TRIG:2,CHOLHDL:2,LDLDIRECT:2 in the last 72 hours No results found for this basename: TSH,T4TOTAL,FREET3,T3FREE,THYROIDAB in the last 72 hours No results found for this basename: VITAMINB12:2,FOLATE:2,FERRITIN:2,TIBC:2,IRON:2,RETICCTPCT:2 in the last 72 hours No results found for this basename: LIPASE:2,AMYLASE:2 in the last 72 hours  Urine Studies No results found for this basename: UACOL:2,UAPR:2,USPG:2,UPH:2,UTP:2,UGL:2,UKET:2,UBIL:2,UHGB:2,UNIT:2,UROB:2,ULEU:2,UEPI:2,UWBC:2,URBC:2,UBAC:2,CAST:2,CRYS:2,UCOM:2,BILUA:2 in the last 72 hours  MICROBIOLOGY: Recent Results (from the past 240 hour(s))  URINE CULTURE     Status: Normal   Collection Time   08/12/12  1:06 PM      Component Value Range Status Comment   Specimen Description URINE, RANDOM   Final    Special Requests ADDED AT 1429 ON 0826 2013   Final    Culture  Setup Time 08/12/2012 17:22   Final    Colony Count >=100,000 COLONIES/ML   Final    Culture     Final    Value: Multiple bacterial morphotypes present, none predominant. Suggest appropriate recollection if clinically indicated.   Report Status 08/14/2012 FINAL   Final   CULTURE, BLOOD (ROUTINE X 2)     Status: Normal (Preliminary result)   Collection Time   08/12/12  3:47 PM      Component Value Range Status Comment   Specimen Description BLOOD FOREARM RIGHT   Final    Special Requests BOTTLES DRAWN AEROBIC AND ANAEROBIC 10CC   Final    Culture  Setup Time 08/12/2012 23:39   Final    Culture     Final    Value:        BLOOD CULTURE RECEIVED NO GROWTH TO DATE CULTURE WILL BE HELD FOR 5 DAYS BEFORE ISSUING A FINAL NEGATIVE REPORT   Report Status PENDING   Incomplete     CULTURE, BLOOD (ROUTINE X 2)     Status: Normal (Preliminary result)   Collection Time   08/12/12  3:52 PM      Component Value Range Status Comment   Specimen Description BLOOD HAND RIGHT   Final    Special Requests BOTTLES DRAWN AEROBIC ONLY [facility]   Final    Culture  Setup Time 08/12/2012 23:41   Final    Culture     Final    Value:        BLOOD CULTURE RECEIVED NO GROWTH TO DATE CULTURE WILL BE HELD FOR 5 DAYS BEFORE ISSUING A FINAL NEGATIVE REPORT   Report Status PENDING   Incomplete   MRSA PCR SCREENING     Status: Abnormal   Collection Time   08/12/12  5:06 PM      Component Value Range Status Comment   MRSA by PCR POSITIVE (*) NEGATIVE Final     RADIOLOGY STUDIES/RESULTS: Dg Chest  Port 1 View  08/12/2012  *RADIOLOGY REPORT*  Clinical Data: Increased labored breathing, history confusion, hypertension, Parkinson's, pneumonia, diabetes, MRSA  PORTABLE CHEST - 1 VIEW  Comparison: Portable exam 1315 hours compared to 06/12/2012  Findings: Borderline enlargement of cardiac silhouette. Tortuous aorta with atherosclerotic calcification. Pulmonary vascularity normal. Lungs clear. No pleural effusion or pneumothorax. Multiple old left rib fractures.  IMPRESSION: No acute abnormalities.   Original Report Authenticated By: Lollie Marrow, M.D.     MEDICATIONS: Scheduled Meds:    . amLODipine  5 mg Oral Daily  . antiseptic oral rinse  15 mL Mouth Rinse q12n4p  . aspirin  81 mg Oral Daily  . carbidopa-levodopa  1 tablet Oral TID  . cefTRIAXone (ROCEPHIN)  IV  1 g Intravenous Q24H  . chlorhexidine  15 mL Mouth Rinse BID  . Chlorhexidine Gluconate Cloth  6 each Topical Q0600  . entacapone  200 mg Oral TID  . heparin  5,000 Units Subcutaneous Q8H  . insulin aspart  0-9 Units Subcutaneous Q4H  . lamoTRIgine  150 mg Oral BID  . LORazepam  0.5 mg Oral QHS  . mupirocin ointment  1 application Nasal BID  . pantoprazole  40 mg Oral Q1200  . sertraline  100 mg Oral Daily   Continuous  Infusions:    . dextrose 5 % and 0.9% NaCl 100 mL/hr at 08/15/12 0530  . DISCONTD: dextrose 125 mL/hr at 08/14/12 1141   PRN Meds:.ondansetron (ZOFRAN) IV, ondansetron, temazepam  Antibiotics: Anti-infectives     Start     Dose/Rate Route Frequency Ordered Stop   08/14/12 1500   cefTRIAXone (ROCEPHIN) 1 g in dextrose 5 % 50 mL IVPB        1 g 100 mL/hr over 30 Minutes Intravenous Every 24 hours 08/14/12 1356     08/12/12 1445   cefTRIAXone (ROCEPHIN) 1 g in dextrose 5 % 50 mL IVPB        1 g 100 mL/hr over 30 Minutes Intravenous  Once 08/12/12 1439 08/12/12 1550   08/12/12 1430   cefTRIAXone (ROCEPHIN) injection 1 g  Status:  Discontinued        1 g Intramuscular  Once 08/12/12 1428 08/12/12 1439           Jeoffrey Massed, MD  [facility] Pager:[phone]  If 7PM-7AM, please contact night-coverage [email or web address] Password [email or web address] 08/15/2012, 11:15 AM   LOS: 3 days

## 2012-08-15 NOTE — Evaluation (Signed)
Clinical/Bedside Swallow Evaluation Patient Details  Name: [name]. MRN: [id number] Date of Birth: 27-Dec-1934  Today's Date: 08/15/2012 Time: 0820-0837 SLP Time Calculation (min): 17 min  Past Medical History:  Past Medical History  Diagnosis Date  . Hearing aid worn   . Hypertension   . Parkinson disease   . Pneumonia   . Diabetes mellitus type II   . Arthritis   . Bipolar disorder   . Anxiety   . Depression   . MRSA infection (methicillin-resistant Staphylococcus aureus)   . Chronic indwelling foley catheter   . C2 cervical fracture     due to pt fall  . SIRS (systemic inflammatory response syndrome)   . Neuromuscular disorder    Past Surgical History:  Past Surgical History  Procedure Date  . Total hip arthroplasty    HPI:  Pt. is a 77 y/o male with history of HTN, Parkinson's Disease, and diabetes. Admitted to hospital from assisted living facility for increased confusion and altered mental state. Prior to being admitted, Pt. was not eating or drinking. MD reports that he is unable to follow commands.    Assessment / Plan / Recommendation Clinical Impression  +    Aspiration Risk  Severe    Diet Recommendation NPO   Liquid Administration via:  (no liquids) Medication Administration: Whole meds with puree    Other  Recommendations Recommended Consults:  (No formal study at this time) Oral Care Recommendations: Oral care BID Other Recommendations: Have oral suction available   Follow Up Recommendations   (to be determined)    Frequency and Duration min 2x/week  2 weeks       SLP Swallow Goals Goal #3: Pt. will initiate volitional swallow with moderate verbal and tactile cueing from clinician.  Swallow Study Goal #3 - Progress: Progressing toward goal Goal #4: Pt. will follow one step directions from clinician with moderate verbal cueing. Swallow Study Goal #4 - Progress: Progressing toward goal   Swallow Study Prior Functional Status       General HPI: Pt. is a 77 y/o male with history of HTN, Parkinson's Disease, and diabetes. Admitted to hospital from assisted living facility for increased confusion and altered mental state. Prior to being admitted, Pt. was not eating or drinking. MD reports that he is unable to follow commands.  Type of Study: Bedside swallow evaluation Diet Prior to this Study: NPO Temperature Spikes Noted: No Respiratory Status: Supplemental O2 delivered via (comment) History of Recent Intubation: No Behavior/Cognition: Confused;Agitated;Impulsive;Uncooperative;Lethargic;Requires cueing;Doesn't follow directions;Decreased sustained attention;Hard of hearing Oral Cavity - Dentition: Missing dentition;Poor condition Self-Feeding Abilities: Total assist;Refused PO (refused puree, would take 1 ice chip by spoon) Patient Positioning: Upright in bed Baseline Vocal Quality: Wet;Low vocal intensity;Hoarse Volitional Cough: Cognitively unable to elicit Volitional Swallow: Unable to elicit    Oral/Motor/Sensory Function Overall Oral Motor/Sensory Function:  (Pt. uncooperative; unable to asses. )   Ice Chips Ice chips: Impaired Presentation: Spoon Oral Phase Impairments: Poor awareness of bolus;Reduced lingual movement/coordination;Impaired anterior to posterior transit Oral Phase Functional Implications: Prolonged oral transit;Oral holding Pharyngeal Phase Impairments: Throat Clearing - Immediate;Throat Clearing - Delayed;Unable to trigger swallow;Wet Vocal Quality (no reflexive swallow observed)   Thin Liquid Thin Liquid: Not tested    Nectar Thick Nectar Thick Liquid: Not tested   Honey Thick Honey Thick Liquid: Not tested   Puree Puree: Not tested (refused)   Solid   GO    Solid: Not tested       [name] 08/15/2012,12:28  PM

## 2012-08-15 NOTE — Progress Notes (Signed)
PT keeps coughing of white secretions and seems to be choking on it. RN has to suction pt q5 mins

## 2012-08-15 NOTE — Progress Notes (Signed)
Physical Therapy Treatment Patient Details Name: [name]. MRN: [id number] DOB: 1935-05-12 Today's Date: 08/15/2012 Time: 4098-1191 PT Time Calculation (min): 15 min  PT Assessment / Plan / Recommendation Comments on Treatment Session  Pt admitted with UTI and encephalopathy and continues to demonstrate decreased cognition and mobility. Pt with decreased participation with bed mobility but increased gait today. Sitter present throughout and nursing staff aware of mobility at end of session.Will continue to follow.    Follow Up Recommendations       Barriers to Discharge        Equipment Recommendations       Recommendations for Other Services    Frequency     Plan Discharge plan remains appropriate;Frequency remains appropriate    Precautions / Restrictions Precautions Precautions: Fall Precaution Comments: cervical collar due to C2 fx 6wks ago Required Braces or Orthoses: Cervical Brace Cervical Brace: Hard collar   Pertinent Vitals/Pain PAINAD= 0    Mobility  Bed Mobility Rolling Left: 2: Max assist Left Sidelying to Sit: 2: Max assist;HOB flat;With rails Details for Bed Mobility Assistance: cueing for sequence and assist to complete transfer as pt not initiating any bed mobility. Assist to bring legs to EOB and elevate trunk from surface Transfers Sit to Stand: From bed;3: Mod assist Stand to Sit: 1: +2 Total assist;To chair/3-in-1 Stand to Sit: Patient Percentage: 60% Details for Transfer Assistance: Pt with tactile cueing and assist to perform anterior translation and sit to stand with decreased control of sitting to bed with assist to back to surface as sitting to prevent fall.  Ambulation/Gait Ambulation/Gait Assistance: 1: +2 Total assist Ambulation/Gait: Patient Percentage: 60% Ambulation Distance (Feet): 18 Feet Assistive device: 2 person hand held assist Ambulation/Gait Assistance Details: Pt maintaining flexed knees and posture with cueing for  posture and safety with mobility and assist for anterior translation, initiation and assist to change direction. Pt smiling and happy to ambulate but again refused ambulation in hallway.  Gait Pattern: Shuffle;Trunk flexed;Narrow base of support Gait velocity: decreased Stairs: No    Exercises General Exercises - Lower Extremity Short Arc Quad: AROM;Right;Other reps (comment);Seated (3 reps then pt stopped following commands)   PT Diagnosis:    PT Problem List:   PT Treatment Interventions:     PT Goals Acute Rehab PT Goals PT Goal: Supine/Side to Sit - Progress: Progressing toward goal PT Goal: Sit to Stand - Progress: Progressing toward goal PT Goal: Stand to Sit - Progress: Progressing toward goal PT Goal: Ambulate - Progress: Progressing toward goal  Visit Information  Last PT Received On: 08/15/12 Assistance Needed: +2    Subjective Data  Subjective: pt continues to speak minimally and more lethargic in bed but increased arousal once up and moving   Cognition  Overall Cognitive Status: Impaired Area of Impairment: Attention;Following commands Arousal/Alertness: Lethargic Behavior During Session: [facility] for tasks performed Current Attention Level: Focused Following Commands: Follows one step commands inconsistently Cognition - Other Comments: Pt again would not answer orientation questions but lethargy decreased once sitting on EOB and smiling and saying 'thank you' end of session    Balance  Static Sitting Balance Static Sitting - Balance Support: Bilateral upper extremity supported;Feet supported Static Sitting - Level of Assistance: 4: Min assist;3: Mod assist Static Sitting - Comment/# of Minutes: Pt initially mod assist EOB but progressed to minguard with cueing over 3 min EOB Static Standing Balance Static Standing - Balance Support: Bilateral upper extremity supported Static Standing - Level of Assistance: 3:  Mod assist Static Standing - Comment/# of Minutes: 1    End of Session PT - End of Session Equipment Utilized During Treatment: Gait belt Activity Tolerance: Patient tolerated treatment well Patient left: in chair;with call bell/phone within reach;Other (comment) (with sitter present) Nurse Communication: Mobility status   GP     Delorse Lek 08/15/2012, 3:24 PM Delaney Meigs, PT [phone]

## 2012-08-15 NOTE — Progress Notes (Signed)
CRITICAL VALUE ALERT  Critical value received:  CBG  Date of notificatiHypoglycemic Event  CBG:64  Treatment: D50 IV 25 mL  Symptoms: None  Follow-up CBG: Time:1830 CBG Result:126  Possible Reasons for Event: Inadequate meal intake  Comments/MD notified:    [name]  Remember to initiate Hypoglycemia Order Set & complete

## 2012-08-16 ENCOUNTER — Inpatient Hospital Stay (HOSPITAL_COMMUNITY): Payer: Medicare Other

## 2012-08-16 ENCOUNTER — Ambulatory Visit (HOSPITAL_COMMUNITY): Payer: Self-pay | Admitting: Psychiatry

## 2012-08-16 DIAGNOSIS — F319 Bipolar disorder, unspecified: Secondary | ICD-10-CM

## 2012-08-16 LAB — CBC
HCT: 37 % — ABNORMAL LOW (ref 39.0–52.0)
Hemoglobin: 12.2 g/dL — ABNORMAL LOW (ref 13.0–17.0)
MCH: 30.4 pg (ref 26.0–34.0)
MCV: 92.3 fL (ref 78.0–100.0)
Platelets: 327 10*3/uL (ref 150–400)
RBC: 4.01 MIL/uL — ABNORMAL LOW (ref 4.22–5.81)
WBC: 10.5 10*3/uL (ref 4.0–10.5)

## 2012-08-16 LAB — GLUCOSE, CAPILLARY
Glucose-Capillary: 101 mg/dL — ABNORMAL HIGH (ref 70–99)
Glucose-Capillary: 108 mg/dL — ABNORMAL HIGH (ref 70–99)
Glucose-Capillary: 94 mg/dL (ref 70–99)
Glucose-Capillary: 94 mg/dL (ref 70–99)

## 2012-08-16 LAB — BASIC METABOLIC PANEL
CO2: 20 mEq/L (ref 19–32)
Calcium: 9.4 mg/dL (ref 8.4–10.5)
Chloride: 116 mEq/L — ABNORMAL HIGH (ref 96–112)
Creatinine, Ser: 1.37 mg/dL — ABNORMAL HIGH (ref 0.50–1.35)
Glucose, Bld: 121 mg/dL — ABNORMAL HIGH (ref 70–99)

## 2012-08-16 LAB — DIFFERENTIAL
Eosinophils Absolute: 0.7 10*3/uL (ref 0.0–0.7)
Lymphocytes Relative: 12 % (ref 12–46)
Lymphs Abs: 1.3 10*3/uL (ref 0.7–4.0)
Monocytes Relative: 7 % (ref 3–12)
Neutro Abs: 7.7 10*3/uL (ref 1.7–7.7)
Neutrophils Relative %: 73 % (ref 43–77)

## 2012-08-16 MED ORDER — GLUCERNA SHAKE PO LIQD
237.0000 mL | Freq: Three times a day (TID) | ORAL | Status: DC
  Administered 2012-08-16 – 2012-08-17 (×3): 237 mL via ORAL

## 2012-08-16 MED ORDER — POTASSIUM CHLORIDE 2 MEQ/ML IV SOLN
INTRAVENOUS | Status: DC
  Administered 2012-08-16 – 2012-08-17 (×2): via INTRAVENOUS
  Filled 2012-08-16 (×4): qty 1000

## 2012-08-16 MED ORDER — LITHIUM CARBONATE 300 MG PO CAPS
300.0000 mg | ORAL_CAPSULE | Freq: Two times a day (BID) | ORAL | Status: DC
  Administered 2012-08-16 – 2012-08-17 (×2): 300 mg via ORAL
  Filled 2012-08-16 (×4): qty 1

## 2012-08-16 NOTE — Progress Notes (Addendum)
Clinical Social Worker continuing to follow for discharge planning. Per MD, pt could possibly be medically stable during weekend. Clinical Social Worker contacted [employer] to discuss. At this time, [name] anticipates that pt could return during weekend, but resident care coordinator wanted to double check with director of ALF. Clinical Child psychotherapist discussed with ALF about pt needing home health PT/OT at ALF. Per facility, they will follow up with this Clinical Social Worker to notify if pt needs to be set up with home health agency for PT/OT or if pt can have PT/OT through the facility. Clinical Social Worker will await response from [employer] in regard to home health needs at ALF in order to notify [facility] of needs. Clinical Social Worker to facilitate pt discharge needs when pt medically ready for discharge.  Addendum: Clinical Child psychotherapist received return phone call from [facility]. Clinical Social Worker discussed with [name] that per MD, pt should be medically ready for discharge Saturday. [name] stated that they will be able to accept pt back during weekend. Facility requested hospital bed and [facility] PT/OT orders. Notified MD and RNCM and faxed orders to facility. Clinical Social Worker updated FL2 and placed on shadow chart for MD signature. Weekend Clinical Social Worker to Risk manager on duty at [facility] to facilitate discharge on Saturday. Clinical Social Worker to continue to follow.  Jacklynn Lewis, MSW, LCSWA  Clinical Social Work [phone]

## 2012-08-16 NOTE — Progress Notes (Addendum)
PATIENT DETAILS Name: [name]. Age: 77 y.o. Sex: male Date of Birth: 1935/08/07 Admit Date: 08/12/2012 Admitting Physician Hollice Espy, MD ZOX:[id number],UJW, MD  Subjective: Awake and alert this am-HPOA at bedside  Assessment/Plan: Principal Problem:  *Toxic encephalopathy -resolved -likely 2/2 UTI -Blood cultures-8/26-NGTD -urine cultures 8/26-contaminated-already on antibiotics-therefore no point in re-culturing -afebrile  Active Problems: Worsening Leukocytosis -resolved -suspicion forAsp PNA-per Nursing staff-patient coughing and pooling secretions -c/w Zosyn-will transition to Augmentin on discharge -CXR done today-reveiwed  Dysphagia -appreciate ST eval, MBS report reveiwed -D/w HPOA at bedside-agreeable for Dys 3 diet, willing to accept risk of aspiration -will need to crush all meds   Acute Urinary retention -? 2/2 clot colic -bladder distended 8/28-foley changed, subsequently resolved  ARF -2/2 to above-obstructive uropathy -monitor lytes-creatinine trending down  Hypernatremia -mild Hypernatremia-Na 146 -encourage oral intake -change to 0.45 NS   Diabetes mellitus -CBG's stable -c/w SSI, resume Metformin when renal function improves further  HTN -resume amlodipine when oral intake resumes -hold off on resuming ACEI and Diuretics till renal function improves further  Parkinson's -resume Sinemet and Entacapone when oral intake resumes  Bipolar 1 disorder -resume Zoloft when oral intake resumes -resume Lithium when renal function better  C2 cervical fracture:  Neurosurgery was consulted by the ER to evaluate the need of the neck brace, per family-patient needs this brace for another 8 weeks  Chronic Indwelling Foley Catheter -2/2 neurogenic bladder -followed by Dr Marline Backbone catheter being considered (per family)  Disposition: Remain inpatient-back to ALF possibly 8/31   DVT Prophylaxis: Prophylactic Heparin  Code  Status: Full code   CONSULTS:  None  PHYSICAL EXAM: Vital signs in last 24 hours: Filed Vitals:   08/15/12 1030 08/15/12 2041 08/16/12 0406 08/16/12 1038  BP: 136/64 149/79 150/74 145/8  Pulse:  60 76   Temp:  98.5 F (36.9 C) 97.6 F (36.4 C)   TempSrc:  Axillary Axillary   Resp:  20 20   Height:      Weight:      SpO2:  93% 96%     Weight change:  Body mass index is 19.33 kg/(m^2).   Gen Exam: Awake and alert with slow speech.  Neck: Supple, No JVD.   Chest: B/L Clear.   CVS: S1 S2 Regular, no murmurs.  Abdomen: soft, BS +, non tender,+ distended bladder Extremities: no edema, lower extremities warm to touch. Neurologic: Non Focal.   Skin: No Rash.   Wounds: N/A.    Intake/Output from previous day:  Intake/Output Summary (Last 24 hours) at 08/16/12 1157 Last data filed at 08/16/12 0405  Gross per 24 hour  Intake      0 ml  Output    850 ml  Net   -850 ml     LAB RESULTS: CBC  Lab 08/16/12 0500 08/15/12 0545 08/14/12 0923 08/13/12 0610 08/12/12 1658 08/12/12 1259  WBC 10.5 19.0* 12.5* 13.9* 15.8* --  HGB 12.2* 13.5 12.0* 12.1* 12.2* --  HCT 37.0* 41.9 36.2* 37.7* 36.7* --  PLT 327 387 364 335 338 --  MCV 92.3 92.9 93.1 93.1 92.0 --  MCH 30.4 29.9 30.8 29.9 30.6 --  MCHC 33.0 32.2 33.1 32.1 33.2 --  RDW 13.5 13.6 13.8 14.0 13.9 --  LYMPHSABS 1.3 -- -- -- -- 1.1  MONOABS 0.8 -- -- -- -- 1.1*  EOSABS 0.7 -- -- -- -- 0.1  BASOSABS 0.0 -- -- -- -- 0.0  BANDABS -- -- -- -- -- --  Chemistries   Lab 08/16/12 0500 08/15/12 0545 08/14/12 0923 08/13/12 0610 08/12/12 1658 08/12/12 1259  NA 146* 143 138 146* -- 142  K 3.3* 3.5 3.7 4.3 -- 4.7  CL 116* 112 108 114* -- 108  CO2 20 22 19  18* -- 24  GLUCOSE 121* 81 138* 120* -- 95  BUN 23 33* 50* 57* -- 60*  CREATININE 1.37* 1.71* 2.63* 2.63* 2.25* --  CALCIUM 9.4 9.5 9.2 9.4 -- 9.9  MG -- -- -- -- -- --    CBG:  Lab 08/16/12 0744 08/16/12 0418 08/16/12 0009 08/15/12 2030 08/15/12 1859  GLUCAP 94  108* 94 93 126*    GFR Estimated Creatinine Clearance: 40.3 ml/min (by C-G formula based on Cr of 1.37).  Coagulation profile No results found for this basename: INR:5,PROTIME:5 in the last 168 hours  Cardiac Enzymes No results found for this basename: CK:3,CKMB:3,TROPONINI:3,MYOGLOBIN:3 in the last 168 hours  No components found with this basename: POCBNP:3 No results found for this basename: DDIMER:2 in the last 72 hours No results found for this basename: HGBA1C:2 in the last 72 hours No results found for this basename: CHOL:2,HDL:2,LDLCALC:2,TRIG:2,CHOLHDL:2,LDLDIRECT:2 in the last 72 hours No results found for this basename: TSH,T4TOTAL,FREET3,T3FREE,THYROIDAB in the last 72 hours No results found for this basename: VITAMINB12:2,FOLATE:2,FERRITIN:2,TIBC:2,IRON:2,RETICCTPCT:2 in the last 72 hours No results found for this basename: LIPASE:2,AMYLASE:2 in the last 72 hours  Urine Studies No results found for this basename: UACOL:2,UAPR:2,USPG:2,UPH:2,UTP:2,UGL:2,UKET:2,UBIL:2,UHGB:2,UNIT:2,UROB:2,ULEU:2,UEPI:2,UWBC:2,URBC:2,UBAC:2,CAST:2,CRYS:2,UCOM:2,BILUA:2 in the last 72 hours  MICROBIOLOGY: Recent Results (from the past 240 hour(s))  URINE CULTURE     Status: Normal   Collection Time   08/12/12  1:06 PM      Component Value Range Status Comment   Specimen Description URINE, RANDOM   Final    Special Requests ADDED AT 1429 ON 0826 2013   Final    Culture  Setup Time 08/12/2012 17:22   Final    Colony Count >=100,000 COLONIES/ML   Final    Culture     Final    Value: Multiple bacterial morphotypes present, none predominant. Suggest appropriate recollection if clinically indicated.   Report Status 08/14/2012 FINAL   Final   CULTURE, BLOOD (ROUTINE X 2)     Status: Normal (Preliminary result)   Collection Time   08/12/12  3:47 PM      Component Value Range Status Comment   Specimen Description BLOOD FOREARM RIGHT   Final    Special Requests BOTTLES DRAWN AEROBIC AND ANAEROBIC  10CC   Final    Culture  Setup Time 08/12/2012 23:39   Final    Culture     Final    Value:        BLOOD CULTURE RECEIVED NO GROWTH TO DATE CULTURE WILL BE HELD FOR 5 DAYS BEFORE ISSUING A FINAL NEGATIVE REPORT   Report Status PENDING   Incomplete   CULTURE, BLOOD (ROUTINE X 2)     Status: Normal (Preliminary result)   Collection Time   08/12/12  3:52 PM      Component Value Range Status Comment   Specimen Description BLOOD HAND RIGHT   Final    Special Requests BOTTLES DRAWN AEROBIC ONLY [facility]   Final    Culture  Setup Time 08/12/2012 23:41   Final    Culture     Final    Value:        BLOOD CULTURE RECEIVED NO GROWTH TO DATE CULTURE WILL BE HELD FOR 5 DAYS BEFORE ISSUING A FINAL NEGATIVE  REPORT   Report Status PENDING   Incomplete   MRSA PCR SCREENING     Status: Abnormal   Collection Time   08/12/12  5:06 PM      Component Value Range Status Comment   MRSA by PCR POSITIVE (*) NEGATIVE Final     RADIOLOGY STUDIES/RESULTS: Dg Chest Port 1 View  08/12/2012  *RADIOLOGY REPORT*  Clinical Data: Increased labored breathing, history confusion, hypertension, Parkinson's, pneumonia, diabetes, MRSA  PORTABLE CHEST - 1 VIEW  Comparison: Portable exam 1315 hours compared to 06/12/2012  Findings: Borderline enlargement of cardiac silhouette. Tortuous aorta with atherosclerotic calcification. Pulmonary vascularity normal. Lungs clear. No pleural effusion or pneumothorax. Multiple old left rib fractures.  IMPRESSION: No acute abnormalities.   Original Report Authenticated By: Lollie Marrow, M.D.     MEDICATIONS: Scheduled Meds:    . amLODipine  5 mg Oral Daily  . antiseptic oral rinse  15 mL Mouth Rinse q12n4p  . aspirin  81 mg Oral Daily  . carbidopa-levodopa  1 tablet Oral TID  . chlorhexidine  15 mL Mouth Rinse BID  . Chlorhexidine Gluconate Cloth  6 each Topical Q0600  . dextrose      . entacapone  200 mg Oral TID  . heparin  5,000 Units Subcutaneous Q8H  . insulin aspart  0-9 Units  Subcutaneous Q4H  . lamoTRIgine  150 mg Oral BID  . LORazepam  0.5 mg Oral QHS  . mupirocin ointment  1 application Nasal BID  . pantoprazole  40 mg Oral Q1200  . piperacillin-tazobactam (ZOSYN)  IV  3.375 g Intravenous Q8H  . sertraline  100 mg Oral Daily   Continuous Infusions:    . dextrose 5 % and 0.45% NaCl 1,000 mL with potassium chloride 20 mEq infusion    . DISCONTD: dextrose 5 % and 0.9% NaCl 75 mL/hr at 08/15/12 1133   PRN Meds:.dextrose, LORazepam, ondansetron (ZOFRAN) IV, ondansetron, temazepam  Antibiotics: Anti-infectives     Start     Dose/Rate Route Frequency Ordered Stop   08/15/12 1200  piperacillin-tazobactam (ZOSYN) IVPB 3.375 g       3.375 g 12.5 mL/hr over 240 Minutes Intravenous Every 8 hours 08/15/12 1131     08/14/12 1500   cefTRIAXone (ROCEPHIN) 1 g in dextrose 5 % 50 mL IVPB  Status:  Discontinued        1 g 100 mL/hr over 30 Minutes Intravenous Every 24 hours 08/14/12 1356 08/15/12 1119   08/12/12 1445   cefTRIAXone (ROCEPHIN) 1 g in dextrose 5 % 50 mL IVPB        1 g 100 mL/hr over 30 Minutes Intravenous  Once 08/12/12 1439 08/12/12 1550   08/12/12 1430   cefTRIAXone (ROCEPHIN) injection 1 g  Status:  Discontinued        1 g Intramuscular  Once 08/12/12 1428 08/12/12 1439           Jeoffrey Massed, MD  [facility] Pager:[phone]  If 7PM-7AM, please contact night-coverage [email or web address] Password [email or web address] 08/16/2012, 11:57 AM   LOS: 4 days

## 2012-08-16 NOTE — Progress Notes (Signed)
INITIAL ADULT NUTRITION ASSESSMENT Date: 08/16/2012   Time: 12:15 PM Reason for Assessment: Health History  INTERVENTION: 1. Glucerna Shake po TID, each supplement provides 220 kcal and 10 grams of protein. 2. RD will continue to follow    DOCUMENTATION CODES Per approved criteria  -Severe malnutrition in the context of acute illness or injury    ASSESSMENT: Male 77 y.o.  Dx: Toxic encephalopathy  Hx:  Past Medical History  Diagnosis Date  . Hearing aid worn   . Hypertension   . Parkinson disease   . Pneumonia   . Diabetes mellitus type II   . Arthritis   . Bipolar disorder   . Anxiety   . Depression   . MRSA infection (methicillin-resistant Staphylococcus aureus)   . Chronic indwelling foley catheter   . C2 cervical fracture     due to pt fall  . SIRS (systemic inflammatory response syndrome)   . Neuromuscular disorder     Related Meds:     . amLODipine  5 mg Oral Daily  . antiseptic oral rinse  15 mL Mouth Rinse q12n4p  . aspirin  81 mg Oral Daily  . carbidopa-levodopa  1 tablet Oral TID  . chlorhexidine  15 mL Mouth Rinse BID  . Chlorhexidine Gluconate Cloth  6 each Topical Q0600  . dextrose      . entacapone  200 mg Oral TID  . heparin  5,000 Units Subcutaneous Q8H  . insulin aspart  0-9 Units Subcutaneous Q4H  . lamoTRIgine  150 mg Oral BID  . lithium carbonate  300 mg Oral BID WC  . LORazepam  0.5 mg Oral QHS  . mupirocin ointment  1 application Nasal BID  . pantoprazole  40 mg Oral Q1200  . piperacillin-tazobactam (ZOSYN)  IV  3.375 g Intravenous Q8H  . sertraline  100 mg Oral Daily     Ht: 5\' 10"  (177.8 cm)  Wt: 134 lb 11.2 oz (61.1 kg)  Ideal Wt: 75.5 kg % Ideal Wt: 81%  Usual Wt: unknown, pt unsure, not indicated in shadow chart  Wt Readings from Last 10 Encounters:  08/12/12 134 lb 11.2 oz (61.1 kg)  06/18/12 136 lb 11 oz (62 kg)  06/04/12 138 lb 11.2 oz (62.914 kg)  04/19/12 161 lb 12.8 oz (73.392 kg)  03/04/12 158 lb 3.2 oz  (71.759 kg)    % Usual Wt: 83%  Body mass index is 19.33 kg/(m^2). Pt is WNL based on current BMI   Food/Nutrition Related Hx: MST (Malnutrition Screening Tool) indicates no recent weight loss or poor appetite. Weight hx shows weight loss.   Labs:  CMP     Component Value Date/Time   NA 146* 08/16/2012 0500   K 3.3* 08/16/2012 0500   CL 116* 08/16/2012 0500   CO2 20 08/16/2012 0500   GLUCOSE 121* 08/16/2012 0500   BUN 23 08/16/2012 0500   CREATININE 1.37* 08/16/2012 0500   CALCIUM 9.4 08/16/2012 0500   PROT 5.6* 08/13/2012 0610   ALBUMIN 3.1* 08/13/2012 0610   AST 10 08/13/2012 0610   ALT 7 08/13/2012 0610   ALKPHOS 96 08/13/2012 0610   BILITOT 0.4 08/13/2012 0610   GFRNONAA 49* 08/16/2012 0500   GFRAA 57* 08/16/2012 0500    Intake/Output Summary (Last 24 hours) at 08/16/12 1215 Last data filed at 08/16/12 0405  Gross per 24 hour  Intake      0 ml  Output    850 ml  Net   -850 ml  Diet Order: Dysphagia 3, thin liquids   Supplements/Tube Feeding:  IVF:     dextrose 5 % and 0.45% NaCl 1,000 mL with potassium chloride 20 mEq infusion   DISCONTD: dextrose 5 % and 0.9% NaCl Last Rate: 75 mL/hr at 08/15/12 1133    Estimated Nutritional Needs:   Kcal: 1830-2050  Protein: 76-90 gm Fluid:  1.8-2.2 L   RD pulled to chart for possible aspiration and noticed weight loss trend. Pt being seen/treated by SLP. Had MBS today, diet upgraded to D3-thin.  Pt was admitted with UTI, AMS, and then noticed to be coughing frequently. Concern for asp pna? Per notes, pt with poor appetite this admission.  PO intake documentation shows 0% for all meals since admission (4 days).  Weight hx shows 27 lbs (16.7%) weight loss since 5/3, severe weight loss. Pt with several stage 2 and one stage 1 pressure ulcers to back and buttocks.   Pt meets criteria for severe malnutrition in the context of acute illness 2/2 weight loss of 16.7% in 3 months and po intake < 50% estimated needs for >/= 5 days. Also  likely with fat/muscle wasting.   NUTRITION DIAGNOSIS: -Inadequate oral intake (NI-2.1).  Status: Ongoing  RELATED TO: AMS  AS EVIDENCE BY: 0% po intake since admission   MONITORING/EVALUATION(Goals): Goal: PO intake of meals and supplements to meet 90-100% estimated nutrition needs  Monitor: PO intake, weight, labs  EDUCATION NEEDS: -No education needs identified at this time    Clarene Duke RD, LDN Pager [phone] After Hours pager [phone]  08/16/2012, 12:15 PM

## 2012-08-16 NOTE — Procedures (Signed)
Objective Swallowing Evaluation: Modified Barium Swallowing Study  Patient Details  Name: [name]. MRN: [id number] Date of Birth: [birth year]  Today's Date: 08/16/2012 Time: 0905-0920 SLP Time Calculation (min): 15 min  Past Medical History:  Past Medical History  Diagnosis Date  . Hearing aid worn   . Hypertension   . Parkinson disease   . Pneumonia   . Diabetes mellitus type II   . Arthritis   . Bipolar disorder   . Anxiety   . Depression   . MRSA infection (methicillin-resistant Staphylococcus aureus)   . Chronic indwelling foley catheter   . C2 cervical fracture     due to pt fall  . SIRS (systemic inflammatory response syndrome)   . Neuromuscular disorder    Past Surgical History:  Past Surgical History  Procedure Date  . Total hip arthroplasty    HPI:  Pt. is a 77 y/o male with history of HTN, Parkinson's Disease, and diabetes. Admitted to hospital from assisted living facility for increased confusion and altered mental state. Prior to being admitted, Pt. was not eating or drinking. MD reports that he is unable to follow commands.  MBS recommended after BSE      Assessment / Plan / Recommendation Clinical Impression  Dysphagia Diagnosis: Moderate oral phase dysphagia;Mild pharyngeal phase dysphagia Clinical impression: Pt. alert with improved mentation and cooperatin during MBS.  Pt. demonstrated mod oral dysphagia and mild sensory-motor pharyngeal dysphagia.  Oral prep and masitcation of cracker was functional with delayed transit to posterior oral cavity.  Multiple verbal cues provided to move cracker back and initiate swallow.  Sensory deficits resulted in delayed swallow initiation.  Lingual residue spilled to valleculae and pyriform sinuses post swallow (moderate amount) with no sensation or volitional swallow to clear from pt.  Verbal and visual cues were mild-moderately effective in reducing stasis.  Completed airway protection present without penetration  or aspiration.  He is at higer risk for aspiration from residuals.  Recommend a Dys 3 diet and thin liquids, full supervision, straws ok as long as sips are single and controlled, pills whole in applesauce.         Treatment Recommendation  Therapy as outlined in treatment plan below    Diet Recommendation Dysphagia 3 (Mechanical Soft);Thin liquid   Liquid Administration via: Cup;Straw Medication Administration: Whole meds with puree Supervision: Full supervision/cueing for compensatory strategies;Patient able to self feed Compensations: Slow rate;Small sips/bites;Multiple dry swallows after each bite/sip Postural Changes and/or Swallow Maneuvers: Seated upright 90 degrees    Other  Recommendations Oral Care Recommendations: Oral care BID   Follow Up Recommendations  Skilled Nursing facility    Frequency and Duration min 2x/week  2 weeks       SLP Swallow Goals Patient will consume recommended diet without observed clinical signs of aspiration with: Minimal cueing Patient will utilize recommended strategies during swallow to increase swallowing safety with: Minimal cueing Goal #3: Pt. will initiate volitional swallow with moderate verbal and tactile cueing from clinician.  Swallow Study Goal #3 - Progress: Met   General HPI: Pt. is a 77 y/o male with history of HTN, Parkinson's Disease, and diabetes. Admitted to hospital from assisted living facility for increased confusion and altered mental state. Prior to being admitted, Pt. was not eating or drinking. MD reports that he is unable to follow commands.  MBS recommended after BSE  Type of Study: Modified Barium Swallowing Study Reason for Referral: Objectively evaluate swallowing function Diet Prior to this Study: NPO Respiratory  Status: Supplemental O2 delivered via (comment) History of Recent Intubation: No Behavior/Cognition: Alert;Cooperative;Confused;Requires cueing Oral Cavity - Dentition: Missing dentition;Poor  condition Oral Motor / Sensory Function: Impaired - see Bedside swallow eval Self-Feeding Abilities: Needs assist Patient Positioning: Upright in chair Baseline Vocal Quality: Clear Anatomy: Within functional limits Pharyngeal Secretions: Not observed secondary MBS    Reason for Referral Objectively evaluate swallowing function   Oral Phase     Pharyngeal Phase Pharyngeal Phase: Impaired   Cervical Esophageal Phase    GO    Cervical Esophageal Phase: Leonarda Salon    Darrow Bussing.Ed [employer] [phone]  08/16/2012

## 2012-08-16 NOTE — Progress Notes (Signed)
Physical Therapy Treatment Patient Details Name: [name]. MRN: [id number] DOB: [birth year] Today's Date: 08/16/2012 Time: 2841-3244 PT Time Calculation (min): 15 min  PT Assessment / Plan / Recommendation Comments on Treatment Session  Patient having increased agitation today (caregiver in room with patient).  Less participation with therapy today.    Follow Up Recommendations  Home health PT;Supervision/Assistance - 24 hour    Barriers to Discharge        Equipment Recommendations  None recommended by PT    Recommendations for Other Services    Frequency Min 3X/week   Plan Discharge plan remains appropriate;Frequency remains appropriate    Precautions / Restrictions Precautions Precautions: Fall Precaution Comments: cervical collar due to C2 fx 6wks ago Required Braces or Orthoses: Cervical Brace Cervical Brace: Hard collar Restrictions Weight Bearing Restrictions: No       Mobility  Bed Mobility Bed Mobility: Rolling Left;Left Sidelying to Sit;Sitting - Scoot to Edge of Bed;Sit to Supine Rolling Left: 2: Max assist Left Sidelying to Sit: 2: Max assist;HOB flat;With rails Sitting - Scoot to Edge of Bed: 2: Max assist Sit to Supine: 1: +2 Total assist Sit to Supine: Patient Percentage: 10% Details for Bed Mobility Assistance: Verbal and tactile cuing for technique and to initiate mobility.  Patient sat on EOB x 6 minutes.  Became more agitated - returned to supine.      PT Goals Acute Rehab PT Goals PT Goal: Supine/Side to Sit - Progress: Progressing toward goal PT Goal: Sit to Supine/Side - Progress: Progressing toward goal  Visit Information  Last PT Received On: 08/16/12 Assistance Needed: +2    Subjective Data  Subjective: M'am, my shoulders have arthritis   Cognition  Overall Cognitive Status: Impaired Area of Impairment: Attention;Following commands;Safety/judgement;Memory Arousal/Alertness: Awake/alert Orientation Level: Disoriented  to;Place;Time;Situation Behavior During Session: Agitated Current Attention Level: Focused Attention - Other Comments: Needs redirection to task Memory Deficits: Unable to state directions given within 2 minutes Following Commands: Follows one step commands inconsistently Safety/Judgement: Decreased safety judgement for tasks assessed;Decreased awareness of need for assistance Cognition - Other Comments: Patient agitated today.    Balance  Balance Balance Assessed: Yes Static Sitting Balance Static Sitting - Balance Support: Bilateral upper extremity supported;Feet supported Static Sitting - Level of Assistance: 3: Mod assist Static Sitting - Comment/# of Minutes: Patient sat on EOB x 6 minutes.  Attempted to have patient work on sitting upright with midline posture.  Patient leaning posteriorly and to left.  Mod assist to maintain upright position.  End of Session PT - End of Session Activity Tolerance: Treatment limited secondary to agitation Patient left: in bed;with call bell/phone within reach;with nursing in room Nurse Communication: Mobility status   GP     Vena Austria 08/16/2012, 6:41 PM Durenda Hurt. Renaldo Fiddler, [facility] Acute Rehab Services Pager [phone]

## 2012-08-16 NOTE — Progress Notes (Signed)
CARE MANAGEMENT NOTE 08/16/2012  Patient:  LISTON, THUM   Account Number:  [id number]  Date Initiated:  08/16/2012  Documentation initiated by:  Vance Peper  Subjective/Objective Assessment:   77 yr old male adm for toxic encephalopathy     Action/Plan:   Patient returning to [facility]. Has services provided by [facility].   Anticipated DC Date:  08/17/2012   Anticipated DC Plan:  SKILLED NURSING FACILITY      DC Planning Services  CM consult      PAC Choice  DURABLE MEDICAL EQUIPMENT  HOME HEALTH   Choice offered to / List presented to:     DME arranged  HOSPITAL BED      DME agency  [facility]     [facility] arranged  HH-1 RN  HH-2 PT  HH-3 OT      [facility] agency  [facility]   Status of service:  Completed, signed off Medicare Important Message given?   (If response is "NO", the following Medicare IM given date fields will be blank) Date Medicare IM given:   Date Additional Medicare IM given:    Discharge Disposition:  SKILLED NURSING FACILITY  Per UR Regulation:    If discussed at Long Length of Stay Meetings, dates discussed:    Comments:

## 2012-08-17 DIAGNOSIS — S12100A Unspecified displaced fracture of second cervical vertebra, initial encounter for closed fracture: Secondary | ICD-10-CM

## 2012-08-17 LAB — BASIC METABOLIC PANEL
BUN: 14 mg/dL (ref 6–23)
Chloride: 114 mEq/L — ABNORMAL HIGH (ref 96–112)
GFR calc Af Amer: 64 mL/min — ABNORMAL LOW (ref >90)
GFR calc non Af Amer: 55 mL/min — ABNORMAL LOW (ref >90)
Potassium: 3.4 mEq/L — ABNORMAL LOW (ref 3.5–5.1)
Sodium: 143 mEq/L (ref 135–145)

## 2012-08-17 LAB — GLUCOSE, CAPILLARY: Glucose-Capillary: 101 mg/dL — ABNORMAL HIGH (ref 70–99)

## 2012-08-17 MED ORDER — AMOXICILLIN-POT CLAVULANATE 875-125 MG PO TABS: 1.0000 | ORAL_TABLET | Freq: Two times a day (BID) | ORAL | Status: DC

## 2012-08-17 MED ORDER — TEMAZEPAM 30 MG PO CAPS: 30.0000 mg | ORAL_CAPSULE | Freq: Every evening | ORAL | Status: DC | PRN

## 2012-08-17 MED ORDER — GLUCERNA SHAKE PO LIQD: 237.0000 mL | Freq: Three times a day (TID) | ORAL | Status: DC

## 2012-08-17 MED ORDER — LORAZEPAM 0.5 MG PO TABS: 0.5000 mg | ORAL_TABLET | Freq: Every day | ORAL | Status: DC

## 2012-08-17 MED ORDER — AMLODIPINE BESYLATE 5 MG PO TABS: 10.0000 mg | ORAL_TABLET | Freq: Every day | ORAL | Status: DC

## 2012-08-17 NOTE — Progress Notes (Signed)
CSW was consulted to complete discharge of patient. Pt to transfer to [facility] today via [facility]. [name] and [facility], Eber Jones [phone]) are aware of d/c. D/C packet complete with d/c summary, signed FL2 with medications, and signed hard Rx.  CSW signing off as no other CSW needs identified at this time.  Lia Foyer, LCSWA [facility] Clinical Social Worker Contact #: [phone] (weekend)

## 2012-08-17 NOTE — Discharge Summary (Addendum)
PATIENT DETAILS Name: [name]. Age: 77 y.o. Sex: male Date of Birth: [birth year] MRN: [id number]. Admit Date: 08/12/2012 Admitting Physician: Hollice Espy, MD WUJ:[id number],WGN, MD  Recommendations for Outpatient Follow-up:  1. Electrolyte in next 2 weeks 2. Dysphagia 3 diet with standard aspiration precautions 3. Recheck A1C in next 2-3 months  PRIMARY DISCHARGE DIAGNOSIS:  Toxic Encephalopathy Aspiration PNA  Diabetes mellitus  Hearing aid worn  Bipolar 1 disorder  Parkinson disease  Urinary tract infection  Hypertension  Bipolar disorder  Anxiety  Chronic indwelling foley catheter  C2 cervical fracture  Acute kidney injury  Hypernatremia   PAST MEDICAL HISTORY: Past Medical History  Diagnosis Date  . Hearing aid worn   . Hypertension   . Parkinson disease   . Pneumonia   . Diabetes mellitus type II   . Arthritis   . Bipolar disorder   . Anxiety   . Depression   . MRSA infection (methicillin-resistant Staphylococcus aureus)   . Chronic indwelling foley catheter   . C2 cervical fracture     due to pt fall  . SIRS (systemic inflammatory response syndrome)   . Neuromuscular disorder     DISCHARGE MEDICATIONS: Medication List  As of 08/17/2012 10:51 AM   STOP taking these medications         hydrochlorothiazide 25 MG tablet      metFORMIN 500 MG tablet      potassium chloride SA 20 MEQ tablet      ramipril 5 MG tablet         TAKE these medications         amLODipine 5 MG tablet   Commonly known as: NORVASC   Take 2 tablets (10 mg total) by mouth daily.      amoxicillin-clavulanate 875-125 MG per tablet   Commonly known as: AUGMENTIN   Take 1 tablet by mouth 2 (two) times daily.Take for 4 more days from 08/17/12.      aspirin 81 MG tablet   Take 81 mg by mouth daily.      carbidopa-levodopa 25-250 MG per tablet   Commonly known as: SINEMET IR   Take 1 tablet by mouth 3 (three) times daily.      entacapone 200 MG tablet   Commonly known as: COMTAN   Take 200 mg by mouth 3 (three) times daily.      feeding supplement Liqd   Take 237 mLs by mouth 3 (three) times daily between meals.      lamoTRIgine 150 MG tablet   Commonly known as: LAMICTAL   Take 150 mg by mouth 2 (two) times daily.      lithium carbonate 300 MG capsule   Take 300 mg by mouth 2 (two) times daily with a meal.      LORazepam 0.5 MG tablet   Commonly known as: ATIVAN   Take 0.5 mg by mouth at bedtime.      multivitamins ther. w/minerals Tabs   Take 1 tablet by mouth daily.      omeprazole 20 MG capsule   Commonly known as: PRILOSEC   Take 20 mg by mouth every morning.      RED YEAST RICE PO   Take 2 tablets by mouth at bedtime.      sertraline 100 MG tablet   Commonly known as: ZOLOFT   Take 100 mg by mouth daily.               temazepam 30 MG capsule  Commonly known as: RESTORIL   Take 30 mg by mouth at bedtime as needed. FOR INSOMNIA             BRIEF HPI:  See H&P, Labs, Consult and Test reports for all details in brief, patient was admitted for altered mental status.  CONSULTATIONS:   None  PERTINENT RADIOLOGIC STUDIES: Mr Sherrin Daisy Contrast  08/16/2012  *RADIOLOGY REPORT*  Clinical Data: Dysphagia.  Hypertension and diabetes and Parkinson's disease.  MRI HEAD WITHOUT CONTRAST  Technique:  Multiplanar, multiecho pulse sequences of the brain and surrounding structures were obtained according to standard protocol without intravenous contrast.  Comparison: CT 06/12/2012, MRI 06/04/2012  Findings: The patient was not able to  hold still and there is moderate motion on the study degrading image quality.  Generalized atrophy of a moderate degree.  Negative for acute infarct.  Mild chronic microvascular ischemia in the white matter. Brainstem is intact.  Negative for hemorrhage or mass lesion. Prominent CSF posterior to the  cerebellum compatible with mega cisterna magna or arachnoid cyst.  This is unchanged.   IMPRESSION: Image quality degraded by moderate motion.  Allowing for this, no acute abnormality and no change from prior study.   Original Report Authenticated By: Camelia Phenes, M.D.    Dg Chest Port 1 View  08/15/2012  *RADIOLOGY REPORT*  Clinical Data: Shortness of breath and cough.  Question pneumonia.  PORTABLE CHEST - 1 VIEW  Comparison: 06/12/2012 and 08/12/2012.  Findings: The heart size and mediastinal contours are stable with aortic atherosclerosis.  Old rib fractures are present on the left. There is patchy retrocardiac left lower lobe opacity which appears slightly increased compared with the most recent examination. Appearance is similar to the study from 2 months ago.  The right lung is clear.  There is no pleural effusion.  Advanced glenohumeral degenerative changes are present bilaterally.  IMPRESSION: Chronic left lower lobe airspace opacity appears slightly greater than on the most recent examination, possibly due to superimposed atelectasis.  Superimposed infection is difficult to exclude; radiographic followup recommended if this remains a clinical concern.   Original Report Authenticated By: Gerrianne Scale, M.D.    Dg Chest Port 1 View  08/12/2012  *RADIOLOGY REPORT*  Clinical Data: Increased labored breathing, history confusion, hypertension, Parkinson's, pneumonia, diabetes, MRSA  PORTABLE CHEST - 1 VIEW  Comparison: Portable exam 1315 hours compared to 06/12/2012  Findings: Borderline enlargement of cardiac silhouette. Tortuous aorta with atherosclerotic calcification. Pulmonary vascularity normal. Lungs clear. No pleural effusion or pneumothorax. Multiple old left rib fractures.  IMPRESSION: No acute abnormalities.   Original Report Authenticated By: Lollie Marrow, M.D.    Dg Swallowing Func-no Report  08/16/2012  CLINICAL DATA: dysphagia   FLUOROSCOPY FOR SWALLOWING FUNCTION STUDY:  Fluoroscopy was provided for swallowing function study, which was  administered by a speech  pathologist.  Final results and recommendations  from this study are contained within the speech pathology report.       PERTINENT LAB RESULTS: CBC:  Basename 08/16/12 0500 08/15/12 0545  WBC 10.5 19.0*  HGB 12.2* 13.5  HCT 37.0* 41.9  PLT 327 387   CMET CMP     Component Value Date/Time   NA 143 08/17/2012 1004   K 3.4* 08/17/2012 1004   CL 114* 08/17/2012 1004   CO2 21 08/17/2012 1004   GLUCOSE 128* 08/17/2012 1004   BUN 14 08/17/2012 1004   CREATININE 1.24 08/17/2012 1004   CALCIUM 8.9 08/17/2012 1004   PROT  5.6* 08/13/2012 0610   ALBUMIN 3.1* 08/13/2012 0610   AST 10 08/13/2012 0610   ALT 7 08/13/2012 0610   ALKPHOS 96 08/13/2012 0610   BILITOT 0.4 08/13/2012 0610   GFRNONAA 55* 08/17/2012 1004   GFRAA 64* 08/17/2012 1004    GFR Estimated Creatinine Clearance: 44.5 ml/min (by C-G formula based on Cr of 1.24). No results found for this basename: LIPASE:2,AMYLASE:2 in the last 72 hours No results found for this basename: CKTOTAL:3,CKMB:3,CKMBINDEX:3,TROPONINI:3 in the last 72 hours No components found with this basename: POCBNP:3 No results found for this basename: DDIMER:2 in the last 72 hours No results found for this basename: HGBA1C:2 in the last 72 hours No results found for this basename: CHOL:2,HDL:2,LDLCALC:2,TRIG:2,CHOLHDL:2,LDLDIRECT:2 in the last 72 hours No results found for this basename: TSH,T4TOTAL,FREET3,T3FREE,THYROIDAB in the last 72 hours No results found for this basename: VITAMINB12:2,FOLATE:2,FERRITIN:2,TIBC:2,IRON:2,RETICCTPCT:2 in the last 72 hours Coags: No results found for this basename: PT:2,INR:2 in the last 72 hours Microbiology: Recent Results (from the past 240 hour(s))  URINE CULTURE     Status: Normal   Collection Time   08/12/12  1:06 PM      Component Value Range Status Comment   Specimen Description URINE, RANDOM   Final    Special Requests ADDED AT 1429 ON 0826 2013   Final    Culture  Setup Time 08/12/2012 17:22   Final    Colony Count  >=100,000 COLONIES/ML   Final    Culture     Final    Value: Multiple bacterial morphotypes present, none predominant. Suggest appropriate recollection if clinically indicated.   Report Status 08/14/2012 FINAL   Final   CULTURE, BLOOD (ROUTINE X 2)     Status: Normal (Preliminary result)   Collection Time   08/12/12  3:47 PM      Component Value Range Status Comment   Specimen Description BLOOD FOREARM RIGHT   Final    Special Requests BOTTLES DRAWN AEROBIC AND ANAEROBIC 10CC   Final    Culture  Setup Time 08/12/2012 23:39   Final    Culture     Final    Value:        BLOOD CULTURE RECEIVED NO GROWTH TO DATE CULTURE WILL BE HELD FOR 5 DAYS BEFORE ISSUING A FINAL NEGATIVE REPORT   Report Status PENDING   Incomplete   CULTURE, BLOOD (ROUTINE X 2)     Status: Normal (Preliminary result)   Collection Time   08/12/12  3:52 PM      Component Value Range Status Comment   Specimen Description BLOOD HAND RIGHT   Final    Special Requests BOTTLES DRAWN AEROBIC ONLY 7CC   Final    Culture  Setup Time 08/12/2012 23:41   Final    Culture     Final    Value:        BLOOD CULTURE RECEIVED NO GROWTH TO DATE CULTURE WILL BE HELD FOR 5 DAYS BEFORE ISSUING A FINAL NEGATIVE REPORT   Report Status PENDING   Incomplete   MRSA PCR SCREENING     Status: Abnormal   Collection Time   08/12/12  5:06 PM      Component Value Range Status Comment   MRSA by PCR POSITIVE (*) NEGATIVE Final      BRIEF HOSPITAL COURSE:   Principal Problem:  *Toxic encephalopathy -resolved and now back to baseline -likely 2/2 UTI  -Blood cultures-8/26-NGTD  -urine cultures 8/26-contaminated-already on antibiotics-therefore no point in re-culturing  -afebrile  Active Problems: Presumed Aspiration PNA -following admission, patient was noted to be pooling secretions and coughing, CXR did not show a definite new infiltrate but her WBC did jump to 19K, she was then transitioned to IV Zosyn. With this he is now much better, WBC has  now normalized, he will be transitioned to Augmentin for another 4 more days. See below for diet and dysphagia issues.  Dysphagia  -suspicion for Asp PNA-per Nursing staff-patient coughing and pooling secretions-therefore Swallow eval and subsequent MBS done -D/w HPOA at bedside-agreeable for Dys 3 diet with thin liquids, willing to accept risk of aspiration  -will need to crush all meds   Leukocytosis -resolved -2/2 to UTI and Asp PNA  Hypernatremia  -mild Hypernatremia-Na 146 -resolved -encourage oral intake  Diabetes mellitus -A1C-5.1 on 5/26 -since not great oral intake and just recovering from ARF-hold Metformin -recheck A1C in a few months to see if can still stay off oral hypoglycemic agents  HTN -BP currently controlled with just amlodioine -given frailty, not great PO intake-high risk of dehydration and subsequent renal failure-would avoid Diuretics and ACEI for the time being, consider restarting if stable over the next few weeks  ARF  -2/2 to above-obstructive uropathy  -monitor lytes-creatinine trending down  Parkinson's  -resume Sinemet and Entacapone   C2 cervical fracture:  -Neurosurgery was consulted by the ER to evaluate the need of the neck brace, per family-patient needs this brace for another 8 weeks -follow up with Neurosurgery as scheduled  Acute Urinary retention  -? 2/2 clot colic  -bladder distended 8/28-foley changed, subsequently resolved  Bipolar 1 disorder -resume Zoloft when oral intake resumes  -resume Lithium   Chronic Indwelling Foley Catheter  -2/2 neurogenic bladder  -followed by Dr Marline Backbone catheter being considered (per family)  TODAY-DAY OF DISCHARGE:  Subjective:   [name] today has no headache,no chest abdominal pain,no new weakness tingling or numbness, feels much better wants to go home today.   Objective:   Blood pressure 103/67, pulse 64, temperature 97.5 F (36.4 C), temperature source Axillary, resp.  rate 20, height 5\' 10"  (1.778 m), weight 61.1 kg (134 lb 11.2 oz), SpO2 99.00%.  Intake/Output Summary (Last 24 hours) at 08/17/12 1116 Last data filed at 08/17/12 0600  Gross per 24 hour  Intake 1386.25 ml  Output    550 ml  Net 836.25 ml    Exam Awake Alert,  No new F.N deficits, Normal affect [place].AT,PERRAL Supple Neck,No JVD, No cervical lymphadenopathy appriciated.  Symmetrical Chest wall movement, Good air movement bilaterally, CTAB RRR,No Gallops,Rubs or new Murmurs, No Parasternal Heave +ve B.Sounds, Abd Soft, Non tender, No organomegaly appriciated, No rebound -guarding or rigidity. No Cyanosis, Clubbing or edema, No new Rash or bruise  DISCHARGE CONDITION: Stable  DISPOSITION: ALF   DISCHARGE INSTRUCTIONS:    Activity:  As tolerated with Full fall precautions use walker/cane & assistance as needed  Diet recommendation: Dysphagia 3 with Thin liquids  Total Time spent on discharge equals 45 minutes.  SignedJeoffrey Massed 08/17/2012 11:16 AM

## 2012-08-17 NOTE — Progress Notes (Signed)
[name] [id number] Discharge Data: 08/17/2012 1:06 PM Attending Provider: No att. providers found GNF:[id number],VHQ, MD     [name]. to be D/C'd Skilled nursing facility per MD order.   All IV's discontinued with no bleeding noted. All belongings returned to patient for patient to take home.   Last Vital Signs:  Blood pressure 103/67, pulse 64, temperature 97.5 F (36.4 C), temperature source Axillary, resp. rate 20, height 5\' 10"  (1.778 m), weight 61.1 kg (134 lb 11.2 oz), SpO2 99.00%.  Discharge Medication List Medication List  As of 08/17/2012  1:06 PM   STOP taking these medications         hydrochlorothiazide 25 MG tablet      metFORMIN 500 MG tablet      potassium chloride SA 20 MEQ tablet      ramipril 5 MG tablet      sodium chloride 0.9 % SOLN 150 mL with vancomycin 1000 MG SOLR 750 mg         TAKE these medications         amLODipine 5 MG tablet   Commonly known as: NORVASC   Take 2 tablets (10 mg total) by mouth daily.      amoxicillin-clavulanate 875-125 MG per tablet   Commonly known as: AUGMENTIN   Take 1 tablet by mouth 2 (two) times daily.      aspirin 81 MG tablet   Take 81 mg by mouth daily.      carbidopa-levodopa 25-250 MG per tablet   Commonly known as: SINEMET IR   Take 1 tablet by mouth 3 (three) times daily.      entacapone 200 MG tablet   Commonly known as: COMTAN   Take 200 mg by mouth 3 (three) times daily.      feeding supplement Liqd   Take 237 mLs by mouth 3 (three) times daily between meals.      lamoTRIgine 150 MG tablet   Commonly known as: LAMICTAL   Take 150 mg by mouth 2 (two) times daily.      lithium carbonate 300 MG capsule   Take 300 mg by mouth 2 (two) times daily with a meal.      LORazepam 0.5 MG tablet   Commonly known as: ATIVAN   Take 1 tablet (0.5 mg total) by mouth at bedtime.      multivitamins ther. w/minerals Tabs   Take 1 tablet by mouth daily.      omeprazole 20 MG capsule   Commonly known as: PRILOSEC   Take 20 mg by mouth every morning.      RED YEAST RICE PO   Take 2 tablets by mouth at bedtime.      sertraline 100 MG tablet   Commonly known as: ZOLOFT   Take 100 mg by mouth daily.      temazepam 30 MG capsule   Commonly known as: RESTORIL   Take 1 capsule (30 mg total) by mouth at bedtime as needed. FOR INSOMNIA            Susann Givens, RN 08/17/2012

## 2012-08-18 LAB — CULTURE, BLOOD (ROUTINE X 2): Culture: NO GROWTH

## 2012-08-19 LAB — GLUCOSE, CAPILLARY

## 2012-08-20 ENCOUNTER — Encounter (HOSPITAL_COMMUNITY): Payer: Self-pay | Admitting: Emergency Medicine

## 2012-08-20 ENCOUNTER — Emergency Department (HOSPITAL_COMMUNITY): Payer: Medicare Other

## 2012-08-20 ENCOUNTER — Emergency Department (HOSPITAL_COMMUNITY)
Admission: EM | Admit: 2012-08-20 | Discharge: 2012-08-20 | Disposition: A | Discharge status: Skilled Nursing Facility | Payer: Medicare Other | Attending: Emergency Medicine | Admitting: Emergency Medicine

## 2012-08-20 DIAGNOSIS — I10 Essential (primary) hypertension: Secondary | ICD-10-CM | POA: Insufficient documentation

## 2012-08-20 DIAGNOSIS — E119 Type 2 diabetes mellitus without complications: Secondary | ICD-10-CM | POA: Insufficient documentation

## 2012-08-20 DIAGNOSIS — R221 Localized swelling, mass and lump, neck: Secondary | ICD-10-CM | POA: Insufficient documentation

## 2012-08-20 DIAGNOSIS — F29 Unspecified psychosis not due to a substance or known physiological condition: Secondary | ICD-10-CM | POA: Insufficient documentation

## 2012-08-20 DIAGNOSIS — IMO0002 Reserved for concepts with insufficient information to code with codable children: Secondary | ICD-10-CM | POA: Insufficient documentation

## 2012-08-20 DIAGNOSIS — F411 Generalized anxiety disorder: Secondary | ICD-10-CM | POA: Insufficient documentation

## 2012-08-20 DIAGNOSIS — G20 Parkinson's disease: Secondary | ICD-10-CM | POA: Insufficient documentation

## 2012-08-20 DIAGNOSIS — G20A1 Parkinson's disease without dyskinesia, without mention of fluctuations: Secondary | ICD-10-CM | POA: Insufficient documentation

## 2012-08-20 DIAGNOSIS — R220 Localized swelling, mass and lump, head: Secondary | ICD-10-CM | POA: Insufficient documentation

## 2012-08-20 DIAGNOSIS — Z96649 Presence of unspecified artificial hip joint: Secondary | ICD-10-CM | POA: Insufficient documentation

## 2012-08-20 DIAGNOSIS — S0081XA Abrasion of other part of head, initial encounter: Secondary | ICD-10-CM

## 2012-08-20 DIAGNOSIS — W19XXXA Unspecified fall, initial encounter: Secondary | ICD-10-CM

## 2012-08-20 DIAGNOSIS — Y9210 Unspecified residential institution as the place of occurrence of the external cause: Secondary | ICD-10-CM | POA: Insufficient documentation

## 2012-08-20 DIAGNOSIS — W010XXA Fall on same level from slipping, tripping and stumbling without subsequent striking against object, initial encounter: Secondary | ICD-10-CM | POA: Insufficient documentation

## 2012-08-20 DIAGNOSIS — Z79899 Other long term (current) drug therapy: Secondary | ICD-10-CM | POA: Insufficient documentation

## 2012-08-20 DIAGNOSIS — F319 Bipolar disorder, unspecified: Secondary | ICD-10-CM | POA: Insufficient documentation

## 2012-08-20 NOTE — ED Notes (Signed)
ZHY:[id number]<QI> Expected date:08/20/12<BR> Expected time: 8:19 AM<BR> Means of arrival:Ambulance<BR> Comments:<BR> 76yoM, Fall/LSB

## 2012-08-20 NOTE — ED Provider Notes (Signed)
History     CSN: [id number]  Arrival date & time 08/20/12  0847   First MD Initiated Contact with Patient 08/20/12 [phone]      Chief Complaint  Patient presents with  . Fall   level V caveat due to dementia.  (Consider location/radiation/quality/duration/timing/severity/associated sxs/prior treatment) Patient is a 77 y.o. male presenting with fall. The history is provided by the patient and the EMS personnel.  Fall Pertinent negatives include no abdominal pain.   patient reportedly had a fall when he tripped and fell. No loss of consciousness. Patient states that he feels fine without complaints. No headache. He does have some swelling of his face. No neck pain. Nursing note states that EMS place a c-collar, however the patient was ordered wearing a c-collar do to previous cervical spine injury. No abdominal pain.  Past Medical History  Diagnosis Date  . Hearing aid worn   . Hypertension   . Parkinson disease   . Pneumonia   . Diabetes mellitus type II   . Arthritis   . Bipolar disorder   . Anxiety   . Depression   . MRSA infection (methicillin-resistant Staphylococcus aureus)   . Chronic indwelling foley catheter   . C2 cervical fracture     due to pt fall  . SIRS (systemic inflammatory response syndrome)   . Neuromuscular disorder     Past Surgical History  Procedure Date  . Total hip arthroplasty     Family History  Problem Relation Age of Onset  . Depression Father     History  Substance Use Topics  . Smoking status: Never Smoker   . Smokeless tobacco: Never Used  . Alcohol Use: Yes     occasional      Review of Systems  Unable to perform ROS: Dementia  Cardiovascular: Negative for chest pain.  Gastrointestinal: Negative for abdominal pain.  Skin: Positive for wound.    Allergies  Cholestatin  Home Medications   Current Outpatient Rx  Name Route Sig Dispense Refill  . AMLODIPINE BESYLATE 5 MG PO TABS Oral Take 10 mg by mouth daily.    .  AMOXICILLIN-POT CLAVULANATE 875-125 MG PO TABS Oral Take 1 tablet by mouth 2 (two) times daily. Scheduled to end on 08/20/12.    Marland Kitchen AMOXICILLIN-POT CLAVULANATE 875-125 MG PO TABS Oral Take 1 tablet by mouth 2 (two) times daily. Scheduled to end on 08/21/12.    . ASPIRIN 81 MG PO TABS Oral Take 81 mg by mouth daily.      Marland Kitchen CARBIDOPA-LEVODOPA 25-250 MG PO TABS Oral Take 1 tablet by mouth 3 (three) times daily.      Marland Kitchen ENTACAPONE 200 MG PO TABS Oral Take 200 mg by mouth 3 (three) times daily.     Marland Kitchen GLUCERNA PO LIQD Oral Take 237 mLs by mouth 3 (three) times daily between meals.    Marland Kitchen LAMOTRIGINE 150 MG PO TABS Oral Take 150 mg by mouth 2 (two) times daily.    Marland Kitchen LITHIUM CARBONATE 300 MG PO CAPS Oral Take 300 mg by mouth 2 (two) times daily with a meal.    . LORAZEPAM 0.5 MG PO TABS Oral Take 0.5 mg by mouth at bedtime. Takes every night per nursing home MAR.    Marland Kitchen LORAZEPAM 0.5 MG PO TABS Oral Take 0.5 mg by mouth 2 (two) times daily as needed. Anxiety.    . ADULT MULTIVITAMIN W/MINERALS CH Oral Take 1 tablet by mouth daily.    Marland Kitchen OMEPRAZOLE 20 MG  PO CPDR Oral Take 20 mg by mouth every morning.     . RED YEAST RICE PO Oral Take 2 tablets by mouth at bedtime.      . SERTRALINE HCL 100 MG PO TABS Oral Take 100 mg by mouth daily.    Marland Kitchen TEMAZEPAM 30 MG PO CAPS Oral Take 30 mg by mouth at bedtime as needed.      BP 150/75  Pulse 54  Temp 98.4 F (36.9 C)  Resp 18  SpO2 100%  Physical Exam  Constitutional: He appears well-developed and well-nourished.  HENT:       Shallow abrasion versus laceration to right orbital ridge. Extraocular movements intact. Ecchymosis and swelling to right zygomatic area. No apparent bony tenderness  Eyes: Pupils are equal, round, and reactive to light.  Neck:       No midline tenderness. Cervical collar is in place.  Cardiovascular: Normal rate and regular rhythm.   Pulmonary/Chest: Effort normal.  Abdominal: Soft.  Musculoskeletal: Normal range of motion.  Neurological:  He is alert.       Mild confusion  Skin: Skin is warm.    ED Course  Procedures (including critical care time)  Labs Reviewed - No data to display Ct Head Wo Contrast  08/20/2012  *RADIOLOGY REPORT*  Clinical Data:  Fall.  Right eyebrow trauma.  Cheek laceration. History of Parkinson's disease. Known right C2 fracture.  CT HEAD WITHOUT CONTRAST CT MAXILLOFACIAL WITHOUT CONTRAST CT CERVICAL SPINE WITHOUT CONTRAST  Technique:  Multidetector CT imaging of the head, cervical spine, and maxillofacial structures were performed using the standard protocol without intravenous contrast. Multiplanar CT image reconstructions of the cervical spine and maxillofacial structures were also generated.  Comparison:  MRI 08/16/2012.  CT 07/05/2012.  CT 06/12/2012.  CT HEAD  Findings: Calvarium intact.  Left sphenoid sinus mucosal thickening remains present.  Hyperostosis of the skull is present.  Atrophy and chronic ischemic white matter disease.  No midline shift, hydrocephalus, or hemorrhage.  Intracranial atherosclerosis.  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  IMPRESSION: No acute abnormality or interval change compared to recent head CT.  CT MAXILLOFACIAL  Findings:  Disconjugate gaze incidentally noted.  Sphenoid sinus mucosal thickening again noted.  Right maxillary floor mucous retention cyst/polyp.  Mandibular condyles are located.  The pterygoid plates are intact. Zygomatic arches and orbital rims appear normal.  Orbital floors and medial orbital wall intact.  Atlantodental degenerative disease.  Nasal bones and frontal process of the maxilla appear intact.  Mandible and alveolar ridge appears intact.  Soft tissue contusion or small hematoma is present over the right cheek.  Mild soft tissue swelling in the right supraorbital region.  IMPRESSION: No facial fracture.  CT CERVICAL SPINE  Findings:   Right C2 lateral mass fracture extending into the right C2-C3 articulation appears little  changed compared to 07/05/2012. Minimal cortical irregularity is present along the articular surface.  Small loose body is present in the posterior recess of the right C2-C3 facet joint.  Multilevel cervical spondylosis appears similar to prior. Straightening of the normal cervical lordosis with mild retrolisthesis of C5 on C6 and C6 on C7 which is degenerative and unchanged.  IMPRESSION: Healing right C2 lateral mass fracture.  No acute osseous abnormality.   Original Report Authenticated By: Andreas Newport, M.D.    Ct Cervical Spine Wo Contrast  08/20/2012  *RADIOLOGY REPORT*  Clinical Data:  Fall.  Right eyebrow trauma.  Cheek laceration. History of Parkinson's disease. Known  right C2 fracture.  CT HEAD WITHOUT CONTRAST CT MAXILLOFACIAL WITHOUT CONTRAST CT CERVICAL SPINE WITHOUT CONTRAST  Technique:  Multidetector CT imaging of the head, cervical spine, and maxillofacial structures were performed using the standard protocol without intravenous contrast. Multiplanar CT image reconstructions of the cervical spine and maxillofacial structures were also generated.  Comparison:  MRI 08/16/2012.  CT 07/05/2012.  CT 06/12/2012.  CT HEAD  Findings: Calvarium intact.  Left sphenoid sinus mucosal thickening remains present.  Hyperostosis of the skull is present.  Atrophy and chronic ischemic white matter disease.  No midline shift, hydrocephalus, or hemorrhage.  Intracranial atherosclerosis.  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  IMPRESSION: No acute abnormality or interval change compared to recent head CT.  CT MAXILLOFACIAL  Findings:  Disconjugate gaze incidentally noted.  Sphenoid sinus mucosal thickening again noted.  Right maxillary floor mucous retention cyst/polyp.  Mandibular condyles are located.  The pterygoid plates are intact. Zygomatic arches and orbital rims appear normal.  Orbital floors and medial orbital wall intact.  Atlantodental degenerative disease.  Nasal bones and  frontal process of the maxilla appear intact.  Mandible and alveolar ridge appears intact.  Soft tissue contusion or small hematoma is present over the right cheek.  Mild soft tissue swelling in the right supraorbital region.  IMPRESSION: No facial fracture.  CT CERVICAL SPINE  Findings:   Right C2 lateral mass fracture extending into the right C2-C3 articulation appears little changed compared to 07/05/2012. Minimal cortical irregularity is present along the articular surface.  Small loose body is present in the posterior recess of the right C2-C3 facet joint.  Multilevel cervical spondylosis appears similar to prior. Straightening of the normal cervical lordosis with mild retrolisthesis of C5 on C6 and C6 on C7 which is degenerative and unchanged.  IMPRESSION: Healing right C2 lateral mass fracture.  No acute osseous abnormality.   Original Report Authenticated By: Andreas Newport, M.D.    Ct Maxillofacial Wo Cm  08/20/2012  *RADIOLOGY REPORT*  Clinical Data:  Fall.  Right eyebrow trauma.  Cheek laceration. History of Parkinson's disease. Known right C2 fracture.  CT HEAD WITHOUT CONTRAST CT MAXILLOFACIAL WITHOUT CONTRAST CT CERVICAL SPINE WITHOUT CONTRAST  Technique:  Multidetector CT imaging of the head, cervical spine, and maxillofacial structures were performed using the standard protocol without intravenous contrast. Multiplanar CT image reconstructions of the cervical spine and maxillofacial structures were also generated.  Comparison:  MRI 08/16/2012.  CT 07/05/2012.  CT 06/12/2012.  CT HEAD  Findings: Calvarium intact.  Left sphenoid sinus mucosal thickening remains present.  Hyperostosis of the skull is present.  Atrophy and chronic ischemic white matter disease.  No midline shift, hydrocephalus, or hemorrhage.  Intracranial atherosclerosis.  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  IMPRESSION: No acute abnormality or interval change compared to recent head CT.  CT  MAXILLOFACIAL  Findings:  Disconjugate gaze incidentally noted.  Sphenoid sinus mucosal thickening again noted.  Right maxillary floor mucous retention cyst/polyp.  Mandibular condyles are located.  The pterygoid plates are intact. Zygomatic arches and orbital rims appear normal.  Orbital floors and medial orbital wall intact.  Atlantodental degenerative disease.  Nasal bones and frontal process of the maxilla appear intact.  Mandible and alveolar ridge appears intact.  Soft tissue contusion or small hematoma is present over the right cheek.  Mild soft tissue swelling in the right supraorbital region.  IMPRESSION: No facial fracture.  CT CERVICAL SPINE  Findings:   Right  C2 lateral mass fracture extending into the right C2-C3 articulation appears little changed compared to 07/05/2012. Minimal cortical irregularity is present along the articular surface.  Small loose body is present in the posterior recess of the right C2-C3 facet joint.  Multilevel cervical spondylosis appears similar to prior. Straightening of the normal cervical lordosis with mild retrolisthesis of C5 on C6 and C6 on C7 which is degenerative and unchanged.  IMPRESSION: Healing right C2 lateral mass fracture.  No acute osseous abnormality.   Original Report Authenticated By: Andreas Newport, M.D.      1. Fall   2. Facial abrasion       MDM  Patient mechanical fall. Patient had baseline. Head facial and C-spine CT were done. Stable healing fracture of C2. No intracranial hemorrhage. Patient be discharged home. No suturable laceration.   Juliet Rude. Rubin Payor, MD 08/20/12 1259

## 2012-08-20 NOTE — ED Notes (Signed)
Per EMS- Patient is a resident of [facility]. Patient reports that he lost his footing, fell and hit his head on the door. Patient has an abrasion to right eyebrow, left knuckle, right cheek, and right ear. Patient would only allow EMS to place a c-collar and refused LSB and head blocks. Patient is alert and oriented. Patient denies pain.

## 2012-08-20 NOTE — ED Notes (Signed)
Pt denies using LOC or dizziness he just tripped and fell

## 2012-08-24 ENCOUNTER — Emergency Department (HOSPITAL_COMMUNITY)
Admission: EM | Admit: 2012-08-24 | Discharge: 2012-08-24 | Disposition: A | Discharge status: Home / Self Care | Payer: Medicare Other | Attending: Emergency Medicine | Admitting: Emergency Medicine

## 2012-08-24 ENCOUNTER — Encounter (HOSPITAL_COMMUNITY): Payer: Self-pay | Admitting: *Deleted

## 2012-08-24 DIAGNOSIS — Z7982 Long term (current) use of aspirin: Secondary | ICD-10-CM | POA: Insufficient documentation

## 2012-08-24 DIAGNOSIS — G20 Parkinson's disease: Secondary | ICD-10-CM | POA: Insufficient documentation

## 2012-08-24 DIAGNOSIS — G20A1 Parkinson's disease without dyskinesia, without mention of fluctuations: Secondary | ICD-10-CM | POA: Insufficient documentation

## 2012-08-24 DIAGNOSIS — Z8739 Personal history of other diseases of the musculoskeletal system and connective tissue: Secondary | ICD-10-CM | POA: Insufficient documentation

## 2012-08-24 DIAGNOSIS — E119 Type 2 diabetes mellitus without complications: Secondary | ICD-10-CM | POA: Insufficient documentation

## 2012-08-24 DIAGNOSIS — R339 Retention of urine, unspecified: Secondary | ICD-10-CM | POA: Insufficient documentation

## 2012-08-24 DIAGNOSIS — I10 Essential (primary) hypertension: Secondary | ICD-10-CM | POA: Insufficient documentation

## 2012-08-24 DIAGNOSIS — F319 Bipolar disorder, unspecified: Secondary | ICD-10-CM | POA: Insufficient documentation

## 2012-08-24 DIAGNOSIS — N390 Urinary tract infection, site not specified: Secondary | ICD-10-CM | POA: Insufficient documentation

## 2012-08-24 DIAGNOSIS — F341 Dysthymic disorder: Secondary | ICD-10-CM | POA: Insufficient documentation

## 2012-08-24 DIAGNOSIS — Z79899 Other long term (current) drug therapy: Secondary | ICD-10-CM | POA: Insufficient documentation

## 2012-08-24 LAB — URINALYSIS, ROUTINE W REFLEX MICROSCOPIC
Glucose, UA: NEGATIVE mg/dL
Nitrite: POSITIVE — AB
Specific Gravity, Urine: 1.012 (ref 1.005–1.030)
pH: 7 (ref 5.0–8.0)

## 2012-08-24 LAB — URINE MICROSCOPIC-ADD ON

## 2012-08-24 MED ORDER — CEPHALEXIN 500 MG PO CAPS: ORAL_CAPSULE | ORAL | Status: AC

## 2012-08-24 NOTE — ED Notes (Signed)
rn tried to withdraw urine using piston syringe from foley tube,rn unable to withdraw any fluids. md alerted and rn ordered to insert new foley catheter.

## 2012-08-24 NOTE — ED Notes (Signed)
Ptar requested for transport

## 2012-08-24 NOTE — ED Notes (Signed)
Per ems pt is from [facility], alert and oriented x4. Staff reported pt was seen in ED recently for same s/s, but pt wanted to come back to ED and be seen.staff does not think pt needed to be evaluated again, it was pts request to be evaluated. Pt told ems he "wanted some cipro".  Pt reports burning sensation during urination 7/10, and lower abdominal pain, non tender to touch. Staff reported foul odor when changing foley.   Pt has spinal stenosis and wears c collar. Pt has late stage bruising to right side of face. Reports he had fallen in past and was evaluated for fall.

## 2012-08-24 NOTE — ED Notes (Addendum)
Report given to donald jones at [facility]. Reports also given to Mrs Mcewan, pts POA

## 2012-08-24 NOTE — ED Notes (Signed)
ZOX:[id number]<AV> Expected date:08/24/12<BR> Expected time:11:20 AM<BR> Means of arrival:Ambulance<BR> Comments:<BR> Foley cath check

## 2012-08-24 NOTE — ED Provider Notes (Signed)
History     CSN: [id number]  Arrival date & time 08/24/12  1128   First MD Initiated Contact with Patient 08/24/12 1149      Chief Complaint  Patient presents with  . possible UTI, check foley     (Consider location/radiation/quality/duration/timing/severity/associated sxs/prior treatment) HPI This 77 year old male has a chronic indwelling Foley catheter, he has chronic generalized weakness, he wears a c-collar from a prior C2 fracture, he had a recent urinary tract infection and finished antibiotics a few days ago, he is a Foley catheter that is draining earlier today but is no longer draining he now is feeling a distended bladder and cannot urinate with lower abdominal pain, he has baseline confusion and is oriented to person and place but not to time and it is typical for him, he is no chest pain cough shortness breath upper abdominal pain vomiting. He feels like he needs a Foley catheter check a urine check. There is no treatment prior to arrival. His lower abdominal pain is mild to moderate. His pain is nonradiating and is associated with inability to void despite a Foley catheter. Past Medical History  Diagnosis Date  . Hearing aid worn   . Hypertension   . Parkinson disease   . Pneumonia   . Diabetes mellitus type II   . Arthritis   . Bipolar disorder   . Anxiety   . Depression   . MRSA infection (methicillin-resistant Staphylococcus aureus)   . Chronic indwelling foley catheter   . C2 cervical fracture     due to pt fall  . SIRS (systemic inflammatory response syndrome)   . Neuromuscular disorder     Past Surgical History  Procedure Date  . Total hip arthroplasty     Family History  Problem Relation Age of Onset  . Depression Father     History  Substance Use Topics  . Smoking status: Never Smoker   . Smokeless tobacco: Never Used  . Alcohol Use: Yes     occasional      Review of Systems 10 Systems reviewed and are negative for acute change except as  noted in the HPI. Allergies  Cholestatin  Home Medications   Current Outpatient Rx  Name Route Sig Dispense Refill  . AMLODIPINE BESYLATE 5 MG PO TABS Oral Take 10 mg by mouth daily.    . AMOXICILLIN-POT CLAVULANATE 875-125 MG PO TABS Oral Take 1 tablet by mouth 2 (two) times daily.     . ASPIRIN 81 MG PO TABS Oral Take 81 mg by mouth daily.      Marland Kitchen CARBIDOPA-LEVODOPA 25-250 MG PO TABS Oral Take 1 tablet by mouth 3 (three) times daily.      Marland Kitchen ENTACAPONE 200 MG PO TABS Oral Take 200 mg by mouth 3 (three) times daily.     Marland Kitchen GLUCERNA PO LIQD Oral Take 237 mLs by mouth 3 (three) times daily between meals.    Marland Kitchen LAMOTRIGINE 150 MG PO TABS Oral Take 150 mg by mouth 2 (two) times daily.    Marland Kitchen LITHIUM CARBONATE 300 MG PO CAPS Oral Take 300 mg by mouth 2 (two) times daily with a meal.    . LORAZEPAM 0.5 MG PO TABS Oral Take 0.5 mg by mouth at bedtime. Takes every night per nursing home MAR.    Marland Kitchen LORAZEPAM 0.5 MG PO TABS Oral Take 0.5 mg by mouth 2 (two) times daily as needed. Anxiety.    . ADULT MULTIVITAMIN W/MINERALS CH Oral Take 1 tablet by  mouth daily.    Marland Kitchen OMEPRAZOLE 20 MG PO CPDR Oral Take 20 mg by mouth every morning.     . RED YEAST RICE PO Oral Take 2 tablets by mouth at bedtime.      . SERTRALINE HCL 100 MG PO TABS Oral Take 100 mg by mouth daily.    Marland Kitchen TEMAZEPAM 30 MG PO CAPS Oral Take 30 mg by mouth at bedtime as needed. For sleep    . CEPHALEXIN 500 MG PO CAPS  2 caps po bid x 7 days 28 capsule 0    BP 114/58  Pulse 61  Temp 97.8 F (36.6 C) (Oral)  Resp 16  SpO2 98%  Physical Exam  Nursing note and vitals reviewed. Constitutional:       Awake, alert, nontoxic appearance.  HENT:  Head: Atraumatic.  Eyes: Right eye exhibits no discharge. Left eye exhibits no discharge.  Neck: Neck supple.  Cardiovascular: Normal rate and regular rhythm.   No murmur heard. Pulmonary/Chest: Effort normal and breath sounds normal. No respiratory distress. He has no wheezes. He has no rales. He  exhibits no tenderness.  Abdominal: Soft. Bowel sounds are normal. He exhibits distension and mass. There is tenderness. There is no rebound and no guarding.       The patient has a Foley catheter in place which is not currently draining and he has a palpable mass in the suprapubic region consistent with an enlarged bladder which is mildly tender the rest of his abdomen is nontender.  Musculoskeletal: He exhibits no tenderness.       Baseline ROM, no obvious new focal weakness.  Neurological: He is alert.       Mental status and motor strength appears baseline for patient and situation. Oriented to person and place which apparently is baseline according to his caretaker here in the ED.  Skin: No rash noted.  Psychiatric: He has a normal mood and affect.    ED Course  Procedures (including critical care time) Asymptomatic after Foley drained urine, abd SNT on recheck. Labs Reviewed  URINALYSIS, ROUTINE W REFLEX MICROSCOPIC - Abnormal; Notable for the following:    Color, Urine AMBER (*)  BIOCHEMICALS MAY BE AFFECTED BY COLOR   APPearance CLOUDY (*)     Hgb urine dipstick LARGE (*)     Nitrite POSITIVE (*)     Leukocytes, UA LARGE (*)     All other components within normal limits  URINE MICROSCOPIC-ADD ON  URINE CULTURE   No results found.   1. Urinary retention   2. UTI (lower urinary tract infection)       MDM  Pt feels improved after observation and/or treatment in ED.Patient / Family / Caregiver informed of clinical course, understand medical decision-making process, and agree with plan.        Hurman Horn, MD 08/24/12 2112

## 2012-08-24 NOTE — ED Notes (Signed)
Leg bag applied. Waiting for ptar to arrive.

## 2012-08-24 NOTE — ED Notes (Signed)
Pt provided with bedside commode to have a bowel movement

## 2012-08-24 NOTE — ED Notes (Signed)
Pt alert and oriented x4. Respirations even and unlabored, bilateral symmetrical rise and fall of chest. Skin warm and dry. In no acute distress. Denies needs.   

## 2012-08-27 LAB — URINE CULTURE: Colony Count: >=100000

## 2012-08-28 NOTE — ED Notes (Signed)
+   urine Patient treated with keflex-sensitive to same-chart appended per protocol MD. 

## 2012-09-06 ENCOUNTER — Encounter (HOSPITAL_COMMUNITY): Payer: Self-pay

## 2012-09-06 ENCOUNTER — Encounter (HOSPITAL_COMMUNITY): Payer: Self-pay | Admitting: Emergency Medicine

## 2012-09-06 ENCOUNTER — Observation Stay (HOSPITAL_COMMUNITY)
Admission: EM | Admit: 2012-09-06 | Discharge: 2012-09-09 | Disposition: A | Discharge status: Skilled Nursing Facility | Payer: Medicare Other | Attending: Internal Medicine | Admitting: Internal Medicine

## 2012-09-06 ENCOUNTER — Emergency Department (HOSPITAL_COMMUNITY)
Admission: EM | Admit: 2012-09-06 | Discharge: 2012-09-06 | Disposition: A | Discharge status: Home / Self Care | Payer: Medicare Other | Source: Home / Self Care | Attending: Emergency Medicine | Admitting: Emergency Medicine

## 2012-09-06 ENCOUNTER — Emergency Department (HOSPITAL_COMMUNITY)
Admission: EM | Admit: 2012-09-06 | Discharge: 2012-09-06 | Disposition: A | Discharge status: Skilled Nursing Facility | Payer: Medicare Other | Source: Home / Self Care | Attending: Emergency Medicine | Admitting: Emergency Medicine

## 2012-09-06 ENCOUNTER — Emergency Department (HOSPITAL_COMMUNITY): Payer: Medicare Other

## 2012-09-06 DIAGNOSIS — F0280 Dementia in other diseases classified elsewhere without behavioral disturbance: Secondary | ICD-10-CM | POA: Insufficient documentation

## 2012-09-06 DIAGNOSIS — G709 Myoneural disorder, unspecified: Secondary | ICD-10-CM

## 2012-09-06 DIAGNOSIS — I10 Essential (primary) hypertension: Secondary | ICD-10-CM | POA: Insufficient documentation

## 2012-09-06 DIAGNOSIS — W19XXXA Unspecified fall, initial encounter: Secondary | ICD-10-CM | POA: Insufficient documentation

## 2012-09-06 DIAGNOSIS — M1990 Unspecified osteoarthritis, unspecified site: Secondary | ICD-10-CM

## 2012-09-06 DIAGNOSIS — G929 Unspecified toxic encephalopathy: Secondary | ICD-10-CM

## 2012-09-06 DIAGNOSIS — Z4789 Encounter for other orthopedic aftercare: Secondary | ICD-10-CM | POA: Insufficient documentation

## 2012-09-06 DIAGNOSIS — S12100A Unspecified displaced fracture of second cervical vertebra, initial encounter for closed fracture: Secondary | ICD-10-CM

## 2012-09-06 DIAGNOSIS — R509 Fever, unspecified: Secondary | ICD-10-CM | POA: Insufficient documentation

## 2012-09-06 DIAGNOSIS — IMO0002 Reserved for concepts with insufficient information to code with codable children: Secondary | ICD-10-CM | POA: Insufficient documentation

## 2012-09-06 DIAGNOSIS — G20 Parkinson's disease: Secondary | ICD-10-CM | POA: Diagnosis present

## 2012-09-06 DIAGNOSIS — B965 Pseudomonas (aeruginosa) (mallei) (pseudomallei) as the cause of diseases classified elsewhere: Secondary | ICD-10-CM | POA: Insufficient documentation

## 2012-09-06 DIAGNOSIS — Z974 Presence of external hearing-aid: Secondary | ICD-10-CM

## 2012-09-06 DIAGNOSIS — S0001XA Abrasion of scalp, initial encounter: Secondary | ICD-10-CM

## 2012-09-06 DIAGNOSIS — R7881 Bacteremia: Secondary | ICD-10-CM

## 2012-09-06 DIAGNOSIS — G92 Toxic encephalopathy: Secondary | ICD-10-CM | POA: Diagnosis present

## 2012-09-06 DIAGNOSIS — N179 Acute kidney failure, unspecified: Secondary | ICD-10-CM

## 2012-09-06 DIAGNOSIS — A419 Sepsis, unspecified organism: Secondary | ICD-10-CM

## 2012-09-06 DIAGNOSIS — E1169 Type 2 diabetes mellitus with other specified complication: Secondary | ICD-10-CM | POA: Insufficient documentation

## 2012-09-06 DIAGNOSIS — T83021A Displacement of indwelling urethral catheter, initial encounter: Secondary | ICD-10-CM

## 2012-09-06 DIAGNOSIS — Z978 Presence of other specified devices: Secondary | ICD-10-CM

## 2012-09-06 DIAGNOSIS — Z79899 Other long term (current) drug therapy: Secondary | ICD-10-CM | POA: Insufficient documentation

## 2012-09-06 DIAGNOSIS — F319 Bipolar disorder, unspecified: Secondary | ICD-10-CM

## 2012-09-06 DIAGNOSIS — E119 Type 2 diabetes mellitus without complications: Secondary | ICD-10-CM

## 2012-09-06 DIAGNOSIS — R7989 Other specified abnormal findings of blood chemistry: Secondary | ICD-10-CM

## 2012-09-06 DIAGNOSIS — Y9210 Unspecified residential institution as the place of occurrence of the external cause: Secondary | ICD-10-CM | POA: Insufficient documentation

## 2012-09-06 DIAGNOSIS — E860 Dehydration: Secondary | ICD-10-CM | POA: Insufficient documentation

## 2012-09-06 DIAGNOSIS — E162 Hypoglycemia, unspecified: Secondary | ICD-10-CM

## 2012-09-06 DIAGNOSIS — G9341 Metabolic encephalopathy: Secondary | ICD-10-CM

## 2012-09-06 DIAGNOSIS — F419 Anxiety disorder, unspecified: Secondary | ICD-10-CM

## 2012-09-06 DIAGNOSIS — E870 Hyperosmolality and hypernatremia: Secondary | ICD-10-CM

## 2012-09-06 DIAGNOSIS — R4182 Altered mental status, unspecified: Secondary | ICD-10-CM

## 2012-09-06 DIAGNOSIS — G9340 Encephalopathy, unspecified: Secondary | ICD-10-CM

## 2012-09-06 DIAGNOSIS — N390 Urinary tract infection, site not specified: Principal | ICD-10-CM

## 2012-09-06 DIAGNOSIS — G20A1 Parkinson's disease without dyskinesia, without mention of fluctuations: Secondary | ICD-10-CM

## 2012-09-06 DIAGNOSIS — S0003XA Contusion of scalp, initial encounter: Secondary | ICD-10-CM | POA: Insufficient documentation

## 2012-09-06 DIAGNOSIS — M5030 Other cervical disc degeneration, unspecified cervical region: Secondary | ICD-10-CM | POA: Insufficient documentation

## 2012-09-06 LAB — CBC
MCH: 30.8 pg (ref 26.0–34.0)
MCHC: 33 g/dL (ref 30.0–36.0)
MCV: 93.2 fL (ref 78.0–100.0)
Platelets: 254 10*3/uL (ref 150–400)
RBC: 3.7 MIL/uL — ABNORMAL LOW (ref 4.22–5.81)

## 2012-09-06 LAB — DIFFERENTIAL
Basophils Relative: 1 % (ref 0–1)
Eosinophils Absolute: 0.5 10*3/uL (ref 0.0–0.7)
Eosinophils Relative: 7 % — ABNORMAL HIGH (ref 0–5)
Lymphs Abs: 1.3 10*3/uL (ref 0.7–4.0)
Monocytes Relative: 5 % (ref 3–12)

## 2012-09-06 LAB — BASIC METABOLIC PANEL
BUN: 18 mg/dL (ref 6–23)
Calcium: 9.3 mg/dL (ref 8.4–10.5)
Chloride: 106 mEq/L (ref 96–112)
Creatinine, Ser: 0.97 mg/dL (ref 0.50–1.35)
GFR calc Af Amer: >90 mL/min (ref >90)
GFR calc non Af Amer: 79 mL/min — ABNORMAL LOW (ref >90)
Potassium: 4 mEq/L (ref 3.5–5.1)

## 2012-09-06 LAB — GLUCOSE, CAPILLARY
Glucose-Capillary: 104 mg/dL — ABNORMAL HIGH (ref 70–99)
Glucose-Capillary: 89 mg/dL (ref 70–99)

## 2012-09-06 MED ORDER — DEXTROSE 50 % IV SOLN
INTRAVENOUS | Status: AC
  Filled 2012-09-06: qty 50

## 2012-09-06 NOTE — ED Notes (Signed)
Pt. From nursing home with fever. Pt. Denies complaints. Currently afebrile.

## 2012-09-06 NOTE — ED Notes (Signed)
ZOX:[id number]<AV> Expected date:<BR> Expected time:<BR> Means of arrival:<BR> Comments:<BR> EMS/elderly-fall-abrasion to top of head-from SNF

## 2012-09-06 NOTE — ED Provider Notes (Signed)
8:24 AM BG 89, patient drinking juice. No new complaints. D/C  Gerhard Munch, MD 09/06/12 [phone]

## 2012-09-06 NOTE — ED Notes (Signed)
Per EMS, pt from nursing home with fever 100.3 tympanic. Pt. Denies complaints. Recently on amoxicillin for UTI.

## 2012-09-06 NOTE — ED Notes (Signed)
Blood Sugar 89 

## 2012-09-06 NOTE — ED Notes (Signed)
AS per EMS pt fell, was found on floor. Small abrasion on top of head. NO LOC/N/V.VSS.skin PWD

## 2012-09-06 NOTE — ED Notes (Signed)
Pt. Wearing ccollar from previous fall.

## 2012-09-06 NOTE — ED Provider Notes (Signed)
History     CSN: [id number]  Arrival date & time 09/06/12  1955   First MD Initiated Contact with Patient 09/06/12 2307        Chief Complaint  Patient presents with  . Fever    (Consider location/radiation/quality/duration/timing/severity/associated sxs/prior treatment) HPI  Please note that this is a late entry. This patient is an elderly man with multiple chronic medical problems including diabetes mellitus, Parkinson's disease, chronic Foley catheter, recent treatment for UTI.  He was sent to the emergency department from his assisted care facility, [name], for evaluation of agitation and fever. Unable to obtain any useful history from the patient. However, I spoke with an attendant at [facility] named [place]. She says she is not terribly familiar with the patient because she works only nights. However, she reports that he has been up, out of his WC and ambulating in hallways and in and out of other resident's rooms all night. This is actually his third trip to the ED in the past 36 hrs.   He was evaluated at [facility] first for hypoglycemia with BG of 59 and then for head injury after an unwitnessed fall. Aurther Loft says that the patient's attending told her to call 911 and ask that the patient be brought to the ED for psychiatric evaluation. The patient was noted to have a temp of 101.3 by forehead scan at 2000 along with a finger stick of 160 mg/dL.   Chart review shows that the patient completed a 7d course of Keflex on Sept 17th. This was prescribed from this ED for treatment of UTI diagnosed on Sept 7. However, review shows that the patient's urine culture grew > 100K CFU of p.aeruginosa with sensitivities to pip/tazo, tobra, ceftaz and cipro.   Aurther Loft says she is unaware of any other med changes.   She says the patient is typically alert, oriented and conversant. She reports that his MS changes have developed over the past 24 hrs. Chart review shows that the patient has  undergone head and c spine CT within the past 12 hrs at [facility] after evaluation for fall. No acute intracranial process was identified.    The patient's only local family is an ex-wife who visits from time to time. I am unable to obtain any hx from the patient.   Past Medical History  Diagnosis Date  . Hearing aid worn   . Hypertension   . Parkinson disease   . Pneumonia   . Diabetes mellitus type II   . Arthritis   . Bipolar disorder   . Anxiety   . Depression   . MRSA infection (methicillin-resistant Staphylococcus aureus)   . Chronic indwelling foley catheter   . C2 cervical fracture     due to pt fall  . SIRS (systemic inflammatory response syndrome)   . Neuromuscular disorder     Past Surgical History  Procedure Date  . Total hip arthroplasty     Family History  Problem Relation Age of Onset  . Depression Father     History  Substance Use Topics  . Smoking status: Never Smoker   . Smokeless tobacco: Never Used  . Alcohol Use: Yes     occasional      Review of Systems  Unable to obtain from the patient because of acute mental status changes.   Allergies  Cholestatin  Home Medications   Current Outpatient Rx  Name Route Sig Dispense Refill  . ACETAMINOPHEN 500 MG PO TABS Oral Take  500 mg by mouth every 4 (four) hours as needed. For pain    . AMLODIPINE BESYLATE 5 MG PO TABS Oral Take 10 mg by mouth daily.    . ASPIRIN 81 MG PO TABS Oral Take 81 mg by mouth daily.      Marland Kitchen CARBIDOPA-LEVODOPA 25-250 MG PO TABS Oral Take 1 tablet by mouth 3 (three) times daily.      Marland Kitchen ENTACAPONE 200 MG PO TABS Oral Take 200 mg by mouth 3 (three) times daily.     Marland Kitchen LAMOTRIGINE 150 MG PO TABS Oral Take 150 mg by mouth 2 (two) times daily.    Marland Kitchen LITHIUM CARBONATE 300 MG PO CAPS Oral Take 300 mg by mouth 2 (two) times daily with a meal.    . LORAZEPAM 0.5 MG PO TABS Oral Take 0.5 mg by mouth at bedtime.     . ADULT MULTIVITAMIN W/MINERALS CH Oral Take 1 tablet by mouth daily.    Marland Kitchen  OMEPRAZOLE 20 MG PO CPDR Oral Take 20 mg by mouth every morning.     . RED YEAST RICE PO Oral Take 2 tablets by mouth at bedtime.      . SERTRALINE HCL 100 MG PO TABS Oral Take 100 mg by mouth daily.    Marland Kitchen TEMAZEPAM 30 MG PO CAPS Oral Take 30 mg by mouth at bedtime as needed.    . CEPHALEXIN 500 MG PO CAPS Oral Take 1,000 mg by mouth 4 (four) times daily. For 7 days      BP 155/65  Pulse 72  Temp 98.9 F (37.2 C) (Oral)  Resp 18  SpO2 98%  Physical Exam  Gen: chronically ill appearing, mildy agitated appearing, patient is alert and oriented only to self.  Head: NCAT Eyes: PERL, EOMI Nose: no epistaixis or rhinorrhea Mouth/throat: mucosa is pink and dehydrated appearing Neck: [place] collar in place supple, no stridor Lungs: CTA B, no wheezing, rhonchi or rales CV: RRR, no murmur, symmetric and palp radial pulses, cap refill 1 to 2 s at nailbeds.  Abd: soft, notender, nondistended GU:  Foley cath in place, no signs of trauma to the penis, otherwise normal male GU exam.  Back: no ttp, no cva ttp Skin: no rashese, wnl Neuro: CN ii-xii grossly intact, no focal deficits Psyche; normal affect,  calm and cooperative.   ED Course  Procedures (including critical care time)  Results for orders placed during the hospital encounter of 09/06/12 (from the past 24 hour(s))  URINALYSIS, ROUTINE W REFLEX MICROSCOPIC     Status: Abnormal   Collection Time   09/07/12 12:13 AM      Component Value Range   Color, Urine YELLOW  YELLOW   APPearance CLOUDY (*) CLEAR   Specific Gravity, Urine 1.011  1.005 - 1.030   pH 7.5  5.0 - 8.0   Glucose, UA NEGATIVE  NEGATIVE mg/dL   Hgb urine dipstick LARGE (*) NEGATIVE   Bilirubin Urine NEGATIVE  NEGATIVE   Ketones, ur NEGATIVE  NEGATIVE mg/dL   Protein, ur NEGATIVE  NEGATIVE mg/dL   Urobilinogen, UA 0.2  0.0 - 1.0 mg/dL   Nitrite NEGATIVE  NEGATIVE   Leukocytes, UA LARGE (*) NEGATIVE  URINE MICROSCOPIC-ADD ON     Status: Abnormal   Collection  Time   09/07/12 12:13 AM      Component Value Range   Squamous Epithelial / LPF RARE  RARE   WBC, UA TOO NUMEROUS TO COUNT  <3 WBC/hpf   RBC /  HPF 11-20  <3 RBC/hpf   Bacteria, UA FEW (*) RARE  AMMONIA     Status: Normal   Collection Time   09/07/12 12:41 AM      Component Value Range   Ammonia 24  11 - 60 umol/L  SALICYLATE LEVEL     Status: Abnormal   Collection Time   09/07/12 12:42 AM      Component Value Range   Salicylate Lvl <2.0 (*) 2.8 - 20.0 mg/dL  ACETAMINOPHEN LEVEL     Status: Normal   Collection Time   09/07/12 12:42 AM      Component Value Range   Acetaminophen (Tylenol), Serum <15.0  10 - 30 ug/mL  TROPONIN I     Status: Normal   Collection Time   09/07/12 12:42 AM      Component Value Range   Troponin I <0.30  <0.30 ng/mL  PROTIME-INR     Status: Normal   Collection Time   09/07/12 12:42 AM      Component Value Range   Prothrombin Time 13.0  11.6 - 15.2 seconds   INR 0.99  0.00 - 1.49  URINE RAPID DRUG SCREEN (HOSP PERFORMED)     Status: Normal   Collection Time   09/07/12 12:59 AM      Component Value Range   Opiates NONE DETECTED  NONE DETECTED   Cocaine NONE DETECTED  NONE DETECTED   Benzodiazepines NONE DETECTED  NONE DETECTED   Amphetamines NONE DETECTED  NONE DETECTED   Tetrahydrocannabinol NONE DETECTED  NONE DETECTED   Barbiturates NONE DETECTED  NONE DETECTED  LITHIUM LEVEL     Status: Abnormal   Collection Time   09/07/12  2:26 AM      Component Value Range   Lithium Lvl 0.50 (*) 0.80 - 1.40 mEq/L     Ct Head Wo Contrast  09/06/2012  *RADIOLOGY REPORT*  Clinical Data:  Status post fall.  The patient found down.  CT HEAD WITHOUT CONTRAST CT CERVICAL SPINE WITHOUT CONTRAST  Technique:  Multidetector CT imaging of the head and cervical spine was performed following the standard protocol without intravenous contrast.  Multiplanar CT image reconstructions of the cervical spine were also generated.  Comparison:  Head and cervical spine CT scans  08/20/2012.  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  There is no evidence of acute abnormality including infarction, hemorrhage, mass lesion, mass effect, midline shift or abnormal extra-axial fluid collection.  No hydrocephalus or pneumocephalus.  Calvarium intact.  Very mild mucosal thickening sphenoid sinus noted and unchanged.  IMPRESSION: No acute finding.  Stable compared to prior exam.  CT CERVICAL SPINE  Findings: There is no fracture or subluxation of the cervical spine.  Degenerative disease is again seen and unchanged in appearance.  Lung apices are clear.  IMPRESSION: No acute finding.  Stable compared to prior exam.   Original Report Authenticated By: Bernadene Bell. Maricela Curet, M.D.    Ct Cervical Spine Wo Contrast  09/06/2012  *RADIOLOGY REPORT*  Clinical Data:  Status post fall.  The patient found down.  CT HEAD WITHOUT CONTRAST CT CERVICAL SPINE WITHOUT CONTRAST  Technique:  Multidetector CT imaging of the head and cervical spine was performed following the standard protocol without intravenous contrast.  Multiplanar CT image reconstructions of the cervical spine were also generated.  Comparison:  Head and cervical spine CT scans 08/20/2012.  CT HEAD  Findings: Chronic microvascular ischemic change and mega cisterna magna  are again seen.  There is no evidence of acute abnormality including infarction, hemorrhage, mass lesion, mass effect, midline shift or abnormal extra-axial fluid collection.  No hydrocephalus or pneumocephalus.  Calvarium intact.  Very mild mucosal thickening sphenoid sinus noted and unchanged.  IMPRESSION: No acute finding.  Stable compared to prior exam.  CT CERVICAL SPINE  Findings: There is no fracture or subluxation of the cervical spine.  Degenerative disease is again seen and unchanged in appearance.  Lung apices are clear.  IMPRESSION: No acute finding.  Stable compared to prior exam.   Original Report Authenticated By:  Bernadene Bell. D'ALESSIO, M.D.    DDX: sepsis, lithium toxicity, dehydration, electrolyte disturbance, stroke, ACS, acid/base disturbance, hyper or hypoglycemia, CVA, hepatic encephalopathy, thyroid crisis, acute psychosis.    MDM  ED work up is notable for spike in temp to 101.76F on rectal exam and urinalysis notable for "too many too count" WBC's per HPF and large amount of leukocyte esterase. We have ruled out hepatic encephalopathy, glucose dyscrasia, significant electrolyte disturbance, pneumonia, ASA and lithium toxicity. Patient has no localizing neuro deficits and MS consistent with delirium so, I do not feel that repeat head CT within 24 hrs is indicated.    Patient is mildly dehydrated appearing on exam and has no history of cardiomyopathy. We are resuscitating with NS. Treating fever. Blood and urine sent for culture. Empiric treatment of complicated UTI with presumed p.aeruginosa (based on 08/24/12 culture) with pip/tazo.  Case discussed with Dr. Conley Rolls who will see and admit. He agrees with ED plan and does not have any further recommendations for ED tx or evaluation at this time.   CRITICAL CARE Performed by: Brandt Loosen   Total critical care time: 45  Critical care time was exclusive of separately billable procedures and treating other patients.  Critical care was necessary to treat or prevent imminent or life-threatening deterioration.  Critical care was time spent personally by me on the following activities: development of treatment plan with patient and/or surrogate as well as nursing, discussions with consultants, evaluation of patient's response to treatment, examination of patient, obtaining history from patient or surrogate, ordering and performing treatments and interventions, ordering and review of laboratory studies, ordering and review of radiographic studies, pulse oximetry and re-evaluation of patient's condition.         Brandt Loosen, MD 09/07/12 0400

## 2012-09-06 NOTE — ED Notes (Signed)
[facility] called for transport.  

## 2012-09-06 NOTE — ED Notes (Signed)
Pt sitting up in bed eating a sandwich given by night shift. Pt in no apparent distress.

## 2012-09-06 NOTE — ED Notes (Signed)
EAV:[id number]> Expected date:<BR> Expected time:<BR> Means of arrival:<BR> Comments:<BR> EMS from SNF-hypoglycemia

## 2012-09-06 NOTE — ED Notes (Signed)
Report given via EMS. Pt c/o wandering rooms [name]. Pt pulled out foley cathter with balloon inflated. Site was not assessed, but blood on sheets. Pt does not remember any of this. Pt denies pain. CBG of 56, attempted IV access unable to gain. Began administration of whole tube of oral glucose at 0520. Initial VS 170/90 Pulse 56 Spo2 97. Pt AAOx4.

## 2012-09-06 NOTE — ED Provider Notes (Signed)
History     CSN: [id number]  Arrival date & time 09/06/12  0026   First MD Initiated Contact with Patient 09/06/12 0036      Chief Complaint  Patient presents with  . Head Injury    (Consider location/radiation/quality/duration/timing/severity/associated sxs/prior treatment) HPI 77 year old male presents to emergency room via EMS from nursing home with report of fall. Patient with dementia, has complaint of being hungry, but denies any other complaints or pain at this time. Patient has chronic C2 cervical fracture in soft collar. Abrasion noted top of head, no other injuries or complaints at this time  Past Medical History  Diagnosis Date  . Hearing aid worn   . Hypertension   . Parkinson disease   . Pneumonia   . Diabetes mellitus type II   . Arthritis   . Bipolar disorder   . Anxiety   . Depression   . MRSA infection (methicillin-resistant Staphylococcus aureus)   . Chronic indwelling foley catheter   . C2 cervical fracture     due to pt fall  . SIRS (systemic inflammatory response syndrome)   . Neuromuscular disorder     Past Surgical History  Procedure Date  . Total hip arthroplasty     Family History  Problem Relation Age of Onset  . Depression Father     History  Substance Use Topics  . Smoking status: Never Smoker   . Smokeless tobacco: Never Used  . Alcohol Use: Yes     occasional      Review of Systems  Unable to perform ROS: Dementia    Allergies  Cholestatin  Home Medications   Current Outpatient Rx  Name Route Sig Dispense Refill  . ACETAMINOPHEN 500 MG PO TABS Oral Take 500 mg by mouth every 4 (four) hours as needed. For pain    . AMLODIPINE BESYLATE 5 MG PO TABS Oral Take 10 mg by mouth daily.    . ASPIRIN 81 MG PO TABS Oral Take 81 mg by mouth daily.      Marland Kitchen CARBIDOPA-LEVODOPA 25-250 MG PO TABS Oral Take 1 tablet by mouth 3 (three) times daily.      Marland Kitchen ENTACAPONE 200 MG PO TABS Oral Take 200 mg by mouth 3 (three) times daily.     Marland Kitchen  GLUCERNA PO LIQD Oral Take 237 mLs by mouth 3 (three) times daily between meals.    Marland Kitchen LAMOTRIGINE 150 MG PO TABS Oral Take 150 mg by mouth 2 (two) times daily.    Marland Kitchen LITHIUM CARBONATE 300 MG PO CAPS Oral Take 300 mg by mouth 2 (two) times daily with a meal.    . LORAZEPAM 0.5 MG PO TABS Oral Take 0.5 mg by mouth at bedtime. Takes every night per nursing home MAR.    Marland Kitchen ADULT MULTIVITAMIN W/MINERALS CH Oral Take 1 tablet by mouth daily.    Marland Kitchen OMEPRAZOLE 20 MG PO CPDR Oral Take 20 mg by mouth every morning.     . RED YEAST RICE PO Oral Take 2 tablets by mouth at bedtime.      . SERTRALINE HCL 100 MG PO TABS Oral Take 100 mg by mouth daily.    Marland Kitchen TEMAZEPAM 30 MG PO CAPS Oral Take 30 mg by mouth at bedtime as needed. For sleep    . LORAZEPAM 0.5 MG PO TABS Oral Take 0.5 mg by mouth 2 (two) times daily as needed. Anxiety.      BP 150/67  Pulse 49  Temp 98.2 F (36.8 C) (  Oral)  Resp 16  SpO2 100%  Physical Exam  Nursing note and vitals reviewed. Constitutional: He appears well-developed and well-nourished. No distress.  HENT:  Right Ear: External ear normal.  Left Ear: External ear normal.  Nose: Nose normal.       Small abrasion to the top of scalp without hematoma noted  Eyes: Conjunctivae normal are normal. Pupils are equal, round, and reactive to light.  Neck:       Soft collar in place, nontender  Cardiovascular: Normal rate, regular rhythm, normal heart sounds and intact distal pulses.  Exam reveals no gallop and no friction rub.   No murmur heard. Pulmonary/Chest: Effort normal and breath sounds normal. No respiratory distress. He has no wheezes. He has no rales. He exhibits no tenderness.  Abdominal: Soft. Bowel sounds are normal. He exhibits no distension and no mass. There is no tenderness. There is no rebound and no guarding.  Musculoskeletal: Normal range of motion. He exhibits no edema and no tenderness.  Neurological: He is alert. Coordination normal.  Skin: Skin is warm and  dry. No rash noted. No erythema. No pallor.    ED Course  Procedures (including critical care time)  Labs Reviewed - No data to display Ct Head Wo Contrast  09/06/2012  *RADIOLOGY REPORT*  Clinical Data:  Status post fall.  The patient found down.  CT HEAD WITHOUT CONTRAST CT CERVICAL SPINE WITHOUT CONTRAST  Technique:  Multidetector CT imaging of the head and cervical spine was performed following the standard protocol without intravenous contrast.  Multiplanar CT image reconstructions of the cervical spine were also generated.  Comparison:  Head and cervical spine CT scans 08/20/2012.  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  There is no evidence of acute abnormality including infarction, hemorrhage, mass lesion, mass effect, midline shift or abnormal extra-axial fluid collection.  No hydrocephalus or pneumocephalus.  Calvarium intact.  Very mild mucosal thickening sphenoid sinus noted and unchanged.  IMPRESSION: No acute finding.  Stable compared to prior exam.  CT CERVICAL SPINE  Findings: There is no fracture or subluxation of the cervical spine.  Degenerative disease is again seen and unchanged in appearance.  Lung apices are clear.  IMPRESSION: No acute finding.  Stable compared to prior exam.   Original Report Authenticated By: Bernadene Bell. Maricela Curet, M.D.    Ct Cervical Spine Wo Contrast  09/06/2012  *RADIOLOGY REPORT*  Clinical Data:  Status post fall.  The patient found down.  CT HEAD WITHOUT CONTRAST CT CERVICAL SPINE WITHOUT CONTRAST  Technique:  Multidetector CT imaging of the head and cervical spine was performed following the standard protocol without intravenous contrast.  Multiplanar CT image reconstructions of the cervical spine were also generated.  Comparison:  Head and cervical spine CT scans 08/20/2012.  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  There is no evidence of acute  abnormality including infarction, hemorrhage, mass lesion, mass effect, midline shift or abnormal extra-axial fluid collection.  No hydrocephalus or pneumocephalus.  Calvarium intact.  Very mild mucosal thickening sphenoid sinus noted and unchanged.  IMPRESSION: No acute finding.  Stable compared to prior exam.  CT CERVICAL SPINE  Findings: There is no fracture or subluxation of the cervical spine.  Degenerative disease is again seen and unchanged in appearance.  Lung apices are clear.  IMPRESSION: No acute finding.  Stable compared to prior exam.   Original Report Authenticated By:  THOMAS L. D'ALESSIO, M.D.      1. Fall   2. Scalp abrasion       MDM  77 year old male status post fall with abrasion to head. CT scans unremarkable. Patient was given Pediapred or crackers as he was hungry, and to be returned to the nursing home.        Olivia Mackie, MD 09/06/12 0600

## 2012-09-06 NOTE — ED Provider Notes (Signed)
History     CSN: [id number]  Arrival date & time 09/06/12  4098   First MD Initiated Contact with Patient 09/06/12 0533      Chief Complaint  Patient presents with  . Hypoglycemia    (Consider location/radiation/quality/duration/timing/severity/associated sxs/prior treatment) HPI 77 year old male returns emergency department with report of pulling his Foley catheter out. Patient was discharged by me earlier in the evening after a fall. He had negative CAT scans. EMS reports patient was wandering around the [place], pulled out his Foley catheter. EMS reports patient was not acting right during transport, and had CABG at 56. Patient was given oral glucose. Patient reports he is hungry, but denies any other complaints at this time. No reported fall.  Past Medical History  Diagnosis Date  . Hearing aid worn   . Hypertension   . Parkinson disease   . Pneumonia   . Diabetes mellitus type II   . Arthritis   . Bipolar disorder   . Anxiety   . Depression   . MRSA infection (methicillin-resistant Staphylococcus aureus)   . Chronic indwelling foley catheter   . C2 cervical fracture     due to pt fall  . SIRS (systemic inflammatory response syndrome)   . Neuromuscular disorder     Past Surgical History  Procedure Date  . Total hip arthroplasty     Family History  Problem Relation Age of Onset  . Depression Father     History  Substance Use Topics  . Smoking status: Never Smoker   . Smokeless tobacco: Never Used  . Alcohol Use: Yes     occasional      Review of Systems  Unable to perform ROS: Dementia    Allergies  Cholestatin  Home Medications   Current Outpatient Rx  Name Route Sig Dispense Refill  . AMLODIPINE BESYLATE 5 MG PO TABS Oral Take 10 mg by mouth daily.    . ASPIRIN 81 MG PO TABS Oral Take 81 mg by mouth daily.      Marland Kitchen CARBIDOPA-LEVODOPA 25-250 MG PO TABS Oral Take 1 tablet by mouth 3 (three) times daily.      Marland Kitchen ENTACAPONE 200 MG PO TABS  Oral Take 200 mg by mouth 3 (three) times daily.     Marland Kitchen GLUCERNA PO LIQD Oral Take 237 mLs by mouth 3 (three) times daily between meals.    Marland Kitchen LAMOTRIGINE 150 MG PO TABS Oral Take 150 mg by mouth 2 (two) times daily.    Marland Kitchen LITHIUM CARBONATE 300 MG PO CAPS Oral Take 300 mg by mouth 2 (two) times daily with a meal.    . LORAZEPAM 0.5 MG PO TABS Oral Take 0.5 mg by mouth at bedtime. Takes every night per nursing home MAR.    Marland Kitchen ADULT MULTIVITAMIN W/MINERALS CH Oral Take 1 tablet by mouth daily.    Marland Kitchen OMEPRAZOLE 20 MG PO CPDR Oral Take 20 mg by mouth every morning.     . RED YEAST RICE PO Oral Take 2 tablets by mouth at bedtime.      . SERTRALINE HCL 100 MG PO TABS Oral Take 100 mg by mouth daily.    . ACETAMINOPHEN 500 MG PO TABS Oral Take 500 mg by mouth every 4 (four) hours as needed. For pain    . LORAZEPAM 0.5 MG PO TABS Oral Take 0.5 mg by mouth 2 (two) times daily as needed. Anxiety.    Marland Kitchen TEMAZEPAM 30 MG PO CAPS Oral Take 30 mg by  mouth at bedtime as needed. For sleep      There were no vitals taken for this visit.  Physical Exam  Nursing note and vitals reviewed. Constitutional: No distress.  HENT:  Head: Normocephalic.  Mouth/Throat: Oropharynx is clear and moist.       Abrasion to forehead noted, dry mucous membranes  Neck:       Soft collar in place no pain  Cardiovascular: Normal rate, regular rhythm and intact distal pulses.  Exam reveals no gallop and no friction rub.   No murmur heard. Pulmonary/Chest: Effort normal and breath sounds normal. No respiratory distress. He has no wheezes. He has no rales. He exhibits no tenderness.  Abdominal: Soft. Bowel sounds are normal. He exhibits no distension and no mass. There is no tenderness. There is no rebound and no guarding.  Neurological: He is alert.  Skin: Skin is warm and dry. No rash noted. No erythema. No pallor.    ED Course  Procedures (including critical care time)   Labs Reviewed  CBC WITH DIFFERENTIAL  BASIC METABOLIC  PANEL   Ct Head Wo Contrast  09/06/2012  *RADIOLOGY REPORT*  Clinical Data:  Status post fall.  The patient found down.  CT HEAD WITHOUT CONTRAST CT CERVICAL SPINE WITHOUT CONTRAST  Technique:  Multidetector CT imaging of the head and cervical spine was performed following the standard protocol without intravenous contrast.  Multiplanar CT image reconstructions of the cervical spine were also generated.  Comparison:  Head and cervical spine CT scans 08/20/2012.  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  There is no evidence of acute abnormality including infarction, hemorrhage, mass lesion, mass effect, midline shift or abnormal extra-axial fluid collection.  No hydrocephalus or pneumocephalus.  Calvarium intact.  Very mild mucosal thickening sphenoid sinus noted and unchanged.  IMPRESSION: No acute finding.  Stable compared to prior exam.  CT CERVICAL SPINE  Findings: There is no fracture or subluxation of the cervical spine.  Degenerative disease is again seen and unchanged in appearance.  Lung apices are clear.  IMPRESSION: No acute finding.  Stable compared to prior exam.   Original Report Authenticated By: Bernadene Bell. Maricela Curet, M.D.    Ct Cervical Spine Wo Contrast  09/06/2012  *RADIOLOGY REPORT*  Clinical Data:  Status post fall.  The patient found down.  CT HEAD WITHOUT CONTRAST CT CERVICAL SPINE WITHOUT CONTRAST  Technique:  Multidetector CT imaging of the head and cervical spine was performed following the standard protocol without intravenous contrast.  Multiplanar CT image reconstructions of the cervical spine were also generated.  Comparison:  Head and cervical spine CT scans 08/20/2012.  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  There is no evidence of acute abnormality including infarction, hemorrhage, mass lesion, mass effect, midline shift or abnormal extra-axial fluid collection.  No  hydrocephalus or pneumocephalus.  Calvarium intact.  Very mild mucosal thickening sphenoid sinus noted and unchanged.  IMPRESSION: No acute finding.  Stable compared to prior exam.  CT CERVICAL SPINE  Findings: There is no fracture or subluxation of the cervical spine.  Degenerative disease is again seen and unchanged in appearance.  Lung apices are clear.  IMPRESSION: No acute finding.  Stable compared to prior exam.   Original Report Authenticated By: Bernadene Bell. Maricela Curet, M.D.     Date: 09/06/2012  Rate: 52  Rhythm: sinus bradycardia  QRS Axis: normal  Intervals: normal  ST/T  Wave abnormalities: normal  Conduction Disutrbances:nonspecific intraventricular conduction delay  Narrative Interpretation:   Old EKG Reviewed: none available    1. Dislodged Foley catheter   2. Hypoglycemia       MDM  77 year old male with hypoglycemia and self DC'd Foley. Foley has been replaced by nursing staff without difficulty. Sugar has improved to 104. We'll give morning meal, and if maintains blood sugars will discharge back to nursing home         Olivia Mackie, MD 09/06/12 [phone]

## 2012-09-07 ENCOUNTER — Emergency Department (HOSPITAL_COMMUNITY): Payer: Medicare Other

## 2012-09-07 DIAGNOSIS — N179 Acute kidney failure, unspecified: Secondary | ICD-10-CM

## 2012-09-07 DIAGNOSIS — S12100A Unspecified displaced fracture of second cervical vertebra, initial encounter for closed fracture: Secondary | ICD-10-CM

## 2012-09-07 DIAGNOSIS — R6889 Other general symptoms and signs: Secondary | ICD-10-CM

## 2012-09-07 DIAGNOSIS — N390 Urinary tract infection, site not specified: Principal | ICD-10-CM

## 2012-09-07 DIAGNOSIS — R4182 Altered mental status, unspecified: Secondary | ICD-10-CM

## 2012-09-07 LAB — COMPREHENSIVE METABOLIC PANEL
Albumin: 3.2 g/dL — ABNORMAL LOW (ref 3.5–5.2)
BUN: 17 mg/dL (ref 6–23)
Calcium: 8.9 mg/dL (ref 8.4–10.5)
Creatinine, Ser: 0.94 mg/dL (ref 0.50–1.35)
Total Protein: 5.2 g/dL — ABNORMAL LOW (ref 6.0–8.3)

## 2012-09-07 LAB — RAPID URINE DRUG SCREEN, HOSP PERFORMED
Amphetamines: NOT DETECTED
Barbiturates: NOT DETECTED
Tetrahydrocannabinol: NOT DETECTED

## 2012-09-07 LAB — CBC
HCT: 33.8 % — ABNORMAL LOW (ref 39.0–52.0)
HCT: 35.8 % — ABNORMAL LOW (ref 39.0–52.0)
MCH: 30.3 pg (ref 26.0–34.0)
MCHC: 33.4 g/dL (ref 30.0–36.0)
MCHC: 32.7 g/dL (ref 30.0–36.0)
MCV: 93.4 fL (ref 78.0–100.0)
MCV: 92.7 fL (ref 78.0–100.0)
Platelets: 237 10*3/uL (ref 150–400)
RDW: 14.8 % (ref 11.5–15.5)
RDW: 15 % (ref 11.5–15.5)

## 2012-09-07 LAB — URINALYSIS, ROUTINE W REFLEX MICROSCOPIC
Glucose, UA: NEGATIVE mg/dL
Protein, ur: NEGATIVE mg/dL
Specific Gravity, Urine: 1.011 (ref 1.005–1.030)
pH: 7.5 (ref 5.0–8.0)

## 2012-09-07 LAB — HEMOGLOBIN A1C: Mean Plasma Glucose: 103 mg/dL (ref <117)

## 2012-09-07 LAB — ACETAMINOPHEN LEVEL: Acetaminophen (Tylenol), Serum: <15 ug/mL (ref 10–30)

## 2012-09-07 LAB — URINE MICROSCOPIC-ADD ON

## 2012-09-07 LAB — TROPONIN I: Troponin I: <0.3 ng/mL (ref <0.30)

## 2012-09-07 LAB — LITHIUM LEVEL: Lithium Lvl: 0.5 mEq/L — ABNORMAL LOW (ref 0.80–1.40)

## 2012-09-07 MED ORDER — LAMOTRIGINE 150 MG PO TABS
150.0000 mg | ORAL_TABLET | Freq: Two times a day (BID) | ORAL | Status: DC
  Administered 2012-09-07 – 2012-09-09 (×5): 150 mg via ORAL
  Filled 2012-09-07 (×6): qty 1

## 2012-09-07 MED ORDER — PIPERACILLIN-TAZOBACTAM 3.375 G IVPB 30 MIN: 3.3750 g | Freq: Three times a day (TID) | INTRAVENOUS | Status: DC

## 2012-09-07 MED ORDER — ACETAMINOPHEN 500 MG PO TABS: 500.0000 mg | ORAL_TABLET | ORAL | Status: DC | PRN

## 2012-09-07 MED ORDER — PANTOPRAZOLE SODIUM 40 MG PO TBEC
40.0000 mg | DELAYED_RELEASE_TABLET | Freq: Every day | ORAL | Status: DC
  Administered 2012-09-07 – 2012-09-09 (×3): 40 mg via ORAL
  Filled 2012-09-07 (×3): qty 1

## 2012-09-07 MED ORDER — ACETAMINOPHEN 650 MG RE SUPP
975.0000 mg | Freq: Once | RECTAL | Status: AC
  Administered 2012-09-07: 975 mg via RECTAL
  Filled 2012-09-07: qty 1

## 2012-09-07 MED ORDER — SERTRALINE HCL 100 MG PO TABS
100.0000 mg | ORAL_TABLET | Freq: Every day | ORAL | Status: DC
  Administered 2012-09-07 – 2012-09-09 (×3): 100 mg via ORAL
  Filled 2012-09-07 (×3): qty 1

## 2012-09-07 MED ORDER — AMLODIPINE BESYLATE 10 MG PO TABS
10.0000 mg | ORAL_TABLET | Freq: Every day | ORAL | Status: DC
  Administered 2012-09-07 – 2012-09-09 (×3): 10 mg via ORAL
  Filled 2012-09-07 (×3): qty 1

## 2012-09-07 MED ORDER — CARBIDOPA-LEVODOPA 25-250 MG PO TABS
1.0000 | ORAL_TABLET | Freq: Three times a day (TID) | ORAL | Status: DC
  Administered 2012-09-07 – 2012-09-09 (×8): 1 via ORAL
  Filled 2012-09-07 (×9): qty 1

## 2012-09-07 MED ORDER — LITHIUM CARBONATE 300 MG PO CAPS
300.0000 mg | ORAL_CAPSULE | Freq: Two times a day (BID) | ORAL | Status: DC
  Administered 2012-09-07 – 2012-09-09 (×6): 300 mg via ORAL
  Filled 2012-09-07 (×7): qty 1

## 2012-09-07 MED ORDER — ENTACAPONE 200 MG PO TABS
200.0000 mg | ORAL_TABLET | Freq: Three times a day (TID) | ORAL | Status: DC
  Administered 2012-09-07 – 2012-09-09 (×8): 200 mg via ORAL
  Filled 2012-09-07 (×9): qty 1

## 2012-09-07 MED ORDER — ENOXAPARIN SODIUM 40 MG/0.4ML ~~LOC~~ SOLN
40.0000 mg | SUBCUTANEOUS | Status: DC
  Administered 2012-09-07 – 2012-09-08 (×2): 40 mg via SUBCUTANEOUS
  Filled 2012-09-07 (×2): qty 0.4

## 2012-09-07 MED ORDER — DEXTROSE-NACL 5-0.9 % IV SOLN
INTRAVENOUS | Status: DC
  Administered 2012-09-07 (×2): via INTRAVENOUS

## 2012-09-07 MED ORDER — PIPERACILLIN-TAZOBACTAM 3.375 G IVPB
3.3750 g | Freq: Three times a day (TID) | INTRAVENOUS | Status: DC
  Administered 2012-09-07 – 2012-09-09 (×8): 3.375 g via INTRAVENOUS
  Filled 2012-09-07 (×12): qty 50

## 2012-09-07 MED ORDER — ONDANSETRON HCL 4 MG PO TABS: 4.0000 mg | ORAL_TABLET | Freq: Four times a day (QID) | ORAL | Status: DC | PRN

## 2012-09-07 MED ORDER — ASPIRIN 81 MG PO CHEW
81.0000 mg | CHEWABLE_TABLET | Freq: Every day | ORAL | Status: DC
  Administered 2012-09-07 – 2012-09-09 (×3): 81 mg via ORAL
  Filled 2012-09-07 (×2): qty 1

## 2012-09-07 MED ORDER — ONDANSETRON HCL 4 MG/2ML IJ SOLN: 4.0000 mg | Freq: Four times a day (QID) | INTRAMUSCULAR | Status: DC | PRN

## 2012-09-07 MED ORDER — PIPERACILLIN-TAZOBACTAM 3.375 G IVPB 30 MIN
3.3750 g | Freq: Once | INTRAVENOUS | Status: AC
  Administered 2012-09-07: 3.375 g via INTRAVENOUS
  Filled 2012-09-07: qty 50

## 2012-09-07 MED ORDER — SODIUM CHLORIDE 0.9 % IV BOLUS (SEPSIS)
1000.0000 mL | Freq: Once | INTRAVENOUS | Status: AC
  Administered 2012-09-07: 1000 mL via INTRAVENOUS

## 2012-09-07 MED ORDER — ADULT MULTIVITAMIN W/MINERALS CH
1.0000 | ORAL_TABLET | Freq: Every day | ORAL | Status: DC
  Administered 2012-09-07 – 2012-09-09 (×3): 1 via ORAL
  Filled 2012-09-07 (×3): qty 1

## 2012-09-07 MED ORDER — ASPIRIN 81 MG PO TABS: 81.0000 mg | ORAL_TABLET | Freq: Every day | ORAL | Status: DC

## 2012-09-07 NOTE — Progress Notes (Signed)
[facility] PROGRESS NOTE  JP EASTHAM AVW:[id number] DOB: [birth year] DOA: 09/06/2012 PCP: [name], MD  Assessment/Plan: Acute encephalopathy -Likely due to UTI -Continue Zosyn -Follow urine and blood cultures -Further workup if no improvement -Ammonia, lithium level, urine toxicology, salicylate, acetaminophen levels negative. -Check TSH Pseudomonas UTI -Continue Zosyn  Hypertension -Controlled at this time, continue amlodipine Parkinson's disease -Continue Sinemet, Comtan Bipolar disorder -Continue Lamictal, lithium    Family Communication:   Pt at beside Disposition Plan:   Return to [facility] when stable     Antibiotics:  Zosyn September 21>>>    Procedures/Studies: Dg Chest 2 View  09/07/2012  *RADIOLOGY REPORT*  Clinical Data: Fevers  CHEST - 2 VIEW  Comparison: 08/15/2012  Findings: The heart size and mediastinal contours are within normal limits.  Both lungs are clear.  Chronic left posterior lateral rib fractures are identified.  Similar to previous exam.  Advanced osteoarthritis is noted involving both glenohumeral joints.  IMPRESSION: No active cardiopulmonary abnormalities.   Original Report Authenticated By: Rosealee Albee, M.D.    Ct Head Wo Contrast  09/06/2012  *RADIOLOGY REPORT*  Clinical Data:  Status post fall.  The patient found down.  CT HEAD WITHOUT CONTRAST CT CERVICAL SPINE WITHOUT CONTRAST  Technique:  Multidetector CT imaging of the head and cervical spine was performed following the standard protocol without intravenous contrast.  Multiplanar CT image reconstructions of the cervical spine were also generated.  Comparison:  Head and cervical spine CT scans 08/20/2012.  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  There is no evidence of acute abnormality including infarction, hemorrhage, mass lesion, mass effect, midline shift or abnormal extra-axial fluid collection.  No  hydrocephalus or pneumocephalus.  Calvarium intact.  Very mild mucosal thickening sphenoid sinus noted and unchanged.  IMPRESSION: No acute finding.  Stable compared to prior exam.  CT CERVICAL SPINE  Findings: There is no fracture or subluxation of the cervical spine.  Degenerative disease is again seen and unchanged in appearance.  Lung apices are clear.  IMPRESSION: No acute finding.  Stable compared to prior exam.   Original Report Authenticated By: Bernadene Bell. Maricela Curet, M.D.    Ct Head Wo Contrast  08/20/2012  *RADIOLOGY REPORT*  Clinical Data:  Fall.  Right eyebrow trauma.  Cheek laceration. History of Parkinson's disease. Known right C2 fracture.  CT HEAD WITHOUT CONTRAST CT MAXILLOFACIAL WITHOUT CONTRAST CT CERVICAL SPINE WITHOUT CONTRAST  Technique:  Multidetector CT imaging of the head, cervical spine, and maxillofacial structures were performed using the standard protocol without intravenous contrast. Multiplanar CT image reconstructions of the cervical spine and maxillofacial structures were also generated.  Comparison:  MRI 08/16/2012.  CT 07/05/2012.  CT 06/12/2012.  CT HEAD  Findings: Calvarium intact.  Left sphenoid sinus mucosal thickening remains present.  Hyperostosis of the skull is present.  Atrophy and chronic ischemic white matter disease.  No midline shift, hydrocephalus, or hemorrhage.  Intracranial atherosclerosis.  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  IMPRESSION: No acute abnormality or interval change compared to recent head CT.  CT MAXILLOFACIAL  Findings:  Disconjugate gaze incidentally noted.  Sphenoid sinus mucosal thickening again noted.  Right maxillary floor mucous retention cyst/polyp.  Mandibular condyles are located.  The pterygoid plates are intact. Zygomatic arches and orbital rims appear normal.  Orbital floors and medial orbital wall intact.  Atlantodental degenerative disease.  Nasal bones and frontal process of the maxilla appear intact.  Mandible and alveolar ridge appears intact.  Soft tissue contusion or small hematoma is present over the right cheek.  Mild soft tissue swelling in the right supraorbital region.  IMPRESSION: No facial fracture.  CT CERVICAL SPINE  Findings:   Right C2 lateral mass fracture extending into the right C2-C3 articulation appears little changed compared to 07/05/2012. Minimal cortical irregularity is present along the articular surface.  Small loose body is present in the posterior recess of the right C2-C3 facet joint.  Multilevel cervical spondylosis appears similar to prior. Straightening of the normal cervical lordosis with mild retrolisthesis of C5 on C6 and C6 on C7 which is degenerative and unchanged.  IMPRESSION: Healing right C2 lateral mass fracture.  No acute osseous abnormality.   Original Report Authenticated By: Andreas Newport, M.D.    Ct Cervical Spine Wo Contrast  09/06/2012  *RADIOLOGY REPORT*  Clinical Data:  Status post fall.  The patient found down.  CT HEAD WITHOUT CONTRAST CT CERVICAL SPINE WITHOUT CONTRAST  Technique:  Multidetector CT imaging of the head and cervical spine was performed following the standard protocol without intravenous contrast.  Multiplanar CT image reconstructions of the cervical spine were also generated.  Comparison:  Head and cervical spine CT scans 08/20/2012.  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  There is no evidence of acute abnormality including infarction, hemorrhage, mass lesion, mass effect, midline shift or abnormal extra-axial fluid collection.  No hydrocephalus or pneumocephalus.  Calvarium intact.  Very mild mucosal thickening sphenoid sinus noted and unchanged.  IMPRESSION: No acute finding.  Stable compared to prior exam.  CT CERVICAL SPINE  Findings: There is no fracture or subluxation of the cervical spine.  Degenerative disease is again seen and unchanged in appearance.  Lung apices are clear.   IMPRESSION: No acute finding.  Stable compared to prior exam.   Original Report Authenticated By: Bernadene Bell. Maricela Curet, M.D.    Ct Cervical Spine Wo Contrast  08/20/2012  *RADIOLOGY REPORT*  Clinical Data:  Fall.  Right eyebrow trauma.  Cheek laceration. History of Parkinson's disease. Known right C2 fracture.  CT HEAD WITHOUT CONTRAST CT MAXILLOFACIAL WITHOUT CONTRAST CT CERVICAL SPINE WITHOUT CONTRAST  Technique:  Multidetector CT imaging of the head, cervical spine, and maxillofacial structures were performed using the standard protocol without intravenous contrast. Multiplanar CT image reconstructions of the cervical spine and maxillofacial structures were also generated.  Comparison:  MRI 08/16/2012.  CT 07/05/2012.  CT 06/12/2012.  CT HEAD  Findings: Calvarium intact.  Left sphenoid sinus mucosal thickening remains present.  Hyperostosis of the skull is present.  Atrophy and chronic ischemic white matter disease.  No midline shift, hydrocephalus, or hemorrhage.  Intracranial atherosclerosis.  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  IMPRESSION: No acute abnormality or interval change compared to recent head CT.  CT MAXILLOFACIAL  Findings:  Disconjugate gaze incidentally noted.  Sphenoid sinus mucosal thickening again noted.  Right maxillary floor mucous retention cyst/polyp.  Mandibular condyles are located.  The pterygoid plates are intact. Zygomatic arches and orbital rims appear normal.  Orbital floors and medial orbital wall intact.  Atlantodental degenerative disease.  Nasal bones and frontal process of the maxilla appear intact.  Mandible and alveolar ridge appears intact.  Soft tissue contusion or small hematoma is present over the right cheek.  Mild soft tissue swelling in the right supraorbital region.  IMPRESSION: No facial fracture.  CT CERVICAL SPINE  Findings:  Right C2 lateral mass fracture extending into the right C2-C3 articulation appears little changed compared  to 07/05/2012. Minimal cortical irregularity is present along the articular surface.  Small loose body is present in the posterior recess of the right C2-C3 facet joint.  Multilevel cervical spondylosis appears similar to prior. Straightening of the normal cervical lordosis with mild retrolisthesis of C5 on C6 and C6 on C7 which is degenerative and unchanged.  IMPRESSION: Healing right C2 lateral mass fracture.  No acute osseous abnormality.   Original Report Authenticated By: Andreas Newport, M.D.    Mr Brain Wo Contrast  08/16/2012  *RADIOLOGY REPORT*  Clinical Data: Dysphagia.  Hypertension and diabetes and Parkinson's disease.  MRI HEAD WITHOUT CONTRAST  Technique:  Multiplanar, multiecho pulse sequences of the brain and surrounding structures were obtained according to standard protocol without intravenous contrast.  Comparison: CT 06/12/2012, MRI 06/04/2012  Findings: The patient was not able to  hold still and there is moderate motion on the study degrading image quality.  Generalized atrophy of a moderate degree.  Negative for acute infarct.  Mild chronic microvascular ischemia in the white matter. Brainstem is intact.  Negative for hemorrhage or mass lesion. Prominent CSF posterior to the  cerebellum compatible with mega cisterna magna or arachnoid cyst.  This is unchanged.  IMPRESSION: Image quality degraded by moderate motion.  Allowing for this, no acute abnormality and no change from prior study.   Original Report Authenticated By: Camelia Phenes, M.D.    Dg Chest Port 1 View  08/15/2012  *RADIOLOGY REPORT*  Clinical Data: Shortness of breath and cough.  Question pneumonia.  PORTABLE CHEST - 1 VIEW  Comparison: 06/12/2012 and 08/12/2012.  Findings: The heart size and mediastinal contours are stable with aortic atherosclerosis.  Old rib fractures are present on the left. There is patchy retrocardiac left lower lobe opacity which appears slightly increased compared with the most recent examination.  Appearance is similar to the study from 2 months ago.  The right lung is clear.  There is no pleural effusion.  Advanced glenohumeral degenerative changes are present bilaterally.  IMPRESSION: Chronic left lower lobe airspace opacity appears slightly greater than on the most recent examination, possibly due to superimposed atelectasis.  Superimposed infection is difficult to exclude; radiographic followup recommended if this remains a clinical concern.   Original Report Authenticated By: Gerrianne Scale, M.D.    Dg Chest Port 1 View  08/12/2012  *RADIOLOGY REPORT*  Clinical Data: Increased labored breathing, history confusion, hypertension, Parkinson's, pneumonia, diabetes, MRSA  PORTABLE CHEST - 1 VIEW  Comparison: Portable exam 1315 hours compared to 06/12/2012  Findings: Borderline enlargement of cardiac silhouette. Tortuous aorta with atherosclerotic calcification. Pulmonary vascularity normal. Lungs clear. No pleural effusion or pneumothorax. Multiple old left rib fractures.  IMPRESSION: No acute abnormalities.   Original Report Authenticated By: Lollie Marrow, M.D.    Dg Swallowing Func-no Report  08/16/2012  CLINICAL DATA: dysphagia   FLUOROSCOPY FOR SWALLOWING FUNCTION STUDY:  Fluoroscopy was provided for swallowing function study, which was  administered by a speech pathologist.  Final results and recommendations  from this study are contained within the speech pathology report.     Ct Maxillofacial Wo Cm  08/20/2012  *RADIOLOGY REPORT*  Clinical Data:  Fall.  Right eyebrow trauma.  Cheek laceration. History of Parkinson's disease. Known right C2 fracture.  CT HEAD WITHOUT CONTRAST CT MAXILLOFACIAL WITHOUT CONTRAST CT CERVICAL SPINE WITHOUT CONTRAST  Technique:  Multidetector CT imaging of the head, cervical  spine, and maxillofacial structures were performed using the standard protocol without intravenous contrast. Multiplanar CT image reconstructions of the cervical spine and maxillofacial  structures were also generated.  Comparison:  MRI 08/16/2012.  CT 07/05/2012.  CT 06/12/2012.  CT HEAD  Findings: Calvarium intact.  Left sphenoid sinus mucosal thickening remains present.  Hyperostosis of the skull is present.  Atrophy and chronic ischemic white matter disease.  No midline shift, hydrocephalus, or hemorrhage.  Intracranial atherosclerosis.  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  IMPRESSION: No acute abnormality or interval change compared to recent head CT.  CT MAXILLOFACIAL  Findings:  Disconjugate gaze incidentally noted.  Sphenoid sinus mucosal thickening again noted.  Right maxillary floor mucous retention cyst/polyp.  Mandibular condyles are located.  The pterygoid plates are intact. Zygomatic arches and orbital rims appear normal.  Orbital floors and medial orbital wall intact.  Atlantodental degenerative disease.  Nasal bones and frontal process of the maxilla appear intact.  Mandible and alveolar ridge appears intact.  Soft tissue contusion or small hematoma is present over the right cheek.  Mild soft tissue swelling in the right supraorbital region.  IMPRESSION: No facial fracture.  CT CERVICAL SPINE  Findings:   Right C2 lateral mass fracture extending into the right C2-C3 articulation appears little changed compared to 07/05/2012. Minimal cortical irregularity is present along the articular surface.  Small loose body is present in the posterior recess of the right C2-C3 facet joint.  Multilevel cervical spondylosis appears similar to prior. Straightening of the normal cervical lordosis with mild retrolisthesis of C5 on C6 and C6 on C7 which is degenerative and unchanged.  IMPRESSION: Healing right C2 lateral mass fracture.  No acute osseous abnormality.   Original Report Authenticated By: Andreas Newport, M.D.          Subjective: Patient is pleasantly confused. He denies any headache, dizziness, chest pain, shortness of breath, vomiting, diarrhea,  abdominal pain. He has a chronic indwelling Foley catheter. Patient's caretaker is at the bedside. She validates that the patient is more confused than usual. Patient normally oriented to place and person. Patient is unable to recognize her. Normally, she states that the patient is able to recognize and identify her.  Objective: Filed Vitals:   09/07/12 0122 09/07/12 0230 09/07/12 0456 09/07/12 0500  BP: 146/77 132/84 133/67   Pulse:   58   Temp:  99.9 F (37.7 C) 98.4 F (36.9 C)   TempSrc:  Rectal Oral   Resp: 18 20 18    Height:    5' 10.08" (1.78 m)  Weight:    61.1 kg (134 lb 11.2 oz)  SpO2: 98% 98% 100%     Intake/Output Summary (Last 24 hours) at 09/07/12 0913 Last data filed at 09/07/12 0535  Gross per 24 hour  Intake      0 ml  Output   1800 ml  Net  -1800 ml   Weight change:  Exam:   General:  Pt A&O x 1, follows commands appropriately, not in acute distress  HEENT: No icterus, No thrush, No neck mass, [place]/AT  Cardiovascular: RRR, S1/S2, no rubs, no gallops  Respiratory: Clear to auscultation bilaterally, no wheezing, no crackles, no rhonchi  Abdomen: Soft, non tender, non distended, bowel sounds present, no guarding  Extremities: No edema, No lymphangitis, No petechiae, No rashes, no synovitis  Data Reviewed: Basic Metabolic Panel:  Lab 09/07/12 1324 09/06/12 0600 09/06/12 0042  NA -- 139 137  K -- 4.0 3.9  CL -- 106 104  CO2 -- 24 25  GLUCOSE -- 103* 107*  BUN -- 18 17  CREATININE 0.87 0.97 0.94  CALCIUM -- 9.3 8.9  MG -- -- --  PHOS -- -- --   Liver Function Tests:  Lab 09/06/12 0042  AST 11  ALT 14  ALKPHOS 94  BILITOT 0.2*  PROT 5.2*  ALBUMIN 3.2*   No results found for this basename: LIPASE:5,AMYLASE:5 in the last 168 hours  Lab 09/07/12 0041  AMMONIA 24   CBC:  Lab 09/07/12 0620 09/06/12 0720 09/06/12 0042  WBC 6.4 7.4 8.4  NEUTROABS -- 5.3 --  HGB 11.7* 11.4* 11.3*  HCT 35.8* 34.5* 33.8*  MCV 92.7 93.2 93.4  PLT 209 254 237    Cardiac Enzymes:  Lab 09/07/12 0042  CKTOTAL --  CKMB --  CKMBINDEX --  TROPONINI <0.30   BNP: No components found with this basename: POCBNP:5 CBG:  Lab 09/06/12 0808 09/06/12 0553 09/06/12 0547  GLUCAP 89 107* 104*    No results found for this or any previous visit (from the past 240 hour(s)).   Scheduled Meds:   . acetaminophen  975 mg Rectal Once  . amLODipine  10 mg Oral Daily  . aspirin  81 mg Oral Daily  . carbidopa-levodopa  1 tablet Oral TID  . enoxaparin (LOVENOX) injection  40 mg Subcutaneous Q24H  . entacapone  200 mg Oral TID  . lamoTRIgine  150 mg Oral BID  . lithium carbonate  300 mg Oral BID WC  . multivitamin with minerals  1 tablet Oral Daily  . pantoprazole  40 mg Oral Q1200  . piperacillin-tazobactam  3.375 g Intravenous Once  . piperacillin-tazobactam (ZOSYN)  IV  3.375 g Intravenous Q8H  . sertraline  100 mg Oral Daily  . sodium chloride  1,000 mL Intravenous Once  . DISCONTD: aspirin  81 mg Oral Daily  . DISCONTD: piperacillin-tazobactam  3.375 g Intravenous Q8H   Continuous Infusions:   . dextrose 5 % and 0.9% NaCl 75 mL/hr at 09/07/12 0530     Apurva Reily, DO  [facility] Pager [phone]  If 7PM-7AM, please contact night-coverage [email or web address] Password [facility] 09/07/2012, 9:13 AM   LOS: 1 day

## 2012-09-07 NOTE — ED Notes (Signed)
Changed patient's leg bag to a foley drainage system.  Catheter sample obtained from new drainage system.

## 2012-09-07 NOTE — H&P (Signed)
[facility] History and Physical  [name] ZOX:[id number] DOB: 10-15-1935    PCP:   [name], MD   Chief Complaint: confusion.  HPI: [name] is an 77 y.o. male presents to the ER three times in the past 24 hours with intermittent confusion.  He was walking into other people rooms at his assited living quarter.  He has Hx of parkinson and had a recent fall with C2 Fx.  He also has bipolar disorder, HTN, chronic indwelling catheter and frequent UTIs.  The most recent UTI was with pseudomonas sensitive to Cipro, Cefepime, Zosyn, etc... But he was placed on Keflex at discharge.  Evaluation in the ER included a normal WBC, normal renal fx and electrolytes, with UA showing TNTC WBC and leukocytes.  Hospitalist was asked to admit him for proper IV antibiotic and for obs for his confusion.  Rewiew of Systems:  Constitutional: Negative for malaise, fever and chills. No significant weight loss or weight gain Eyes: Negative for eye pain, redness and discharge, diplopia, visual changes, or flashes of light. ENMT: Negative for ear pain, hoarseness, nasal congestion, sinus pressure and sore throat. No headaches; tinnitus, drooling, or problem swallowing. Cardiovascular: Negative for chest pain, palpitations, diaphoresis, dyspnea and peripheral edema. ; No orthopnea, PND Respiratory: Negative for cough, hemoptysis, wheezing and stridor. No pleuritic chestpain. Gastrointestinal: Negative for nausea, vomiting, diarrhea, constipation, abdominal pain, melena, blood in stool, hematemesis, jaundice and rectal bleeding.    Genitourinary: Negative for frequency, dysuria, incontinence,flank pain and hematuria; Musculoskeletal: Negative for back pain and neck pain. Negative for swelling and trauma.;  Skin: . Negative for pruritus, rash, abrasions, bruising and skin lesion.; ulcerations Neuro: Negative for headache, lightheadedness and neck stiffness. Negative for weakness, altered level of  consciousness , altered mental status, extremity weakness, burning feet, involuntary movement, seizure and syncope.  Psych: negative for anxiety, depression, insomnia, tearfulness, panic attacks, hallucinations, paranoia, suicidal or homicidal ideation    Past Medical History  Diagnosis Date  . Hearing aid worn   . Hypertension   . Parkinson disease   . Pneumonia   . Diabetes mellitus type II   . Arthritis   . Bipolar disorder   . Anxiety   . Depression   . MRSA infection (methicillin-resistant Staphylococcus aureus)   . Chronic indwelling foley catheter   . C2 cervical fracture     due to pt fall  . SIRS (systemic inflammatory response syndrome)   . Neuromuscular disorder     Past Surgical History  Procedure Date  . Total hip arthroplasty     Medications:  HOME MEDS: Prior to Admission medications   Medication Sig Start Date End Date Taking? Authorizing Provider  acetaminophen (TYLENOL) 500 MG tablet Take 500 mg by mouth every 4 (four) hours as needed. For pain   Yes Historical Provider, MD  amLODipine (NORVASC) 5 MG tablet Take 10 mg by mouth daily.   Yes Historical Provider, MD  aspirin 81 MG tablet Take 81 mg by mouth daily.     Yes Historical Provider, MD  carbidopa-levodopa (SINEMET) 25-250 MG per tablet Take 1 tablet by mouth 3 (three) times daily.     Yes Historical Provider, MD  entacapone (COMTAN) 200 MG tablet Take 200 mg by mouth 3 (three) times daily.  12/15/11  Yes Historical Provider, MD  lamoTRIgine (LAMICTAL) 150 MG tablet Take 150 mg by mouth 2 (two) times daily.   Yes Historical Provider, MD  lithium carbonate 300 MG capsule Take 300 mg by mouth  2 (two) times daily with a meal.   Yes Historical Provider, MD  LORazepam (ATIVAN) 0.5 MG tablet Take 0.5 mg by mouth at bedtime.    Yes Historical Provider, MD  Multiple Vitamin (MULTIVITAMIN WITH MINERALS) TABS Take 1 tablet by mouth daily.   Yes Historical Provider, MD  omeprazole (PRILOSEC) 20 MG capsule Take  20 mg by mouth every morning.  12/15/11  Yes Historical Provider, MD  Red Yeast Rice Extract (RED YEAST RICE PO) Take 2 tablets by mouth at bedtime.     Yes Historical Provider, MD  sertraline (ZOLOFT) 100 MG tablet Take 100 mg by mouth daily.   Yes Historical Provider, MD  temazepam (RESTORIL) 30 MG capsule Take 30 mg by mouth at bedtime as needed.   Yes Historical Provider, MD  cephALEXin (KEFLEX) 500 MG capsule Take 1,000 mg by mouth 4 (four) times daily. For 7 days    Historical Provider, MD     Allergies:  Allergies  Allergen Reactions  . Cholestatin Other (See Comments)    Per MAR    Social History:   reports that he has never smoked. He has never used smokeless tobacco. He reports that he drinks alcohol. He reports that he does not use illicit drugs.  Family History: Family History  Problem Relation Age of Onset  . Depression Father      Physical Exam: Filed Vitals:   09/07/12 0003 09/07/12 0122 09/07/12 0230 09/07/12 0456  BP: 141/90 146/77 132/84 133/67  Pulse:    58  Temp: 101.9 F (38.8 C)  99.9 F (37.7 C) 98.4 F (36.9 C)  TempSrc: Rectal  Rectal Oral  Resp: 20 18 20 18   SpO2: 99% 98% 98% 100%   Blood pressure 133/67, pulse 58, temperature 98.4 F (36.9 C), temperature source Oral, resp. rate 18, SpO2 100.00%.  GEN:  Pleasant  patient lying in the stretcher in no acute distress; cooperative with exam. PSYCH:  Intermittently confused; does not appear anxious or depressed; affect is appropriate. HEENT: Mucous membranes pink and anicteric; PERRLA; EOM intact; no cervical lymphadenopathy nor thyromegaly or carotid bruit; no JVD; There were no stridor. Neck is very supple. Breasts:: Not examined CHEST WALL: No tenderness CHEST: Normal respiration, clear to auscultation bilaterally.  HEART: Regular rate and rhythm.  There are no murmur, rub, or gallops.   BACK: No kyphosis or scoliosis; no CVA tenderness ABDOMEN: soft and non-tender; no masses, no organomegaly,  normal abdominal bowel sounds; no pannus; no intertriginous candida. There is no rebound and no distention. Rectal Exam: Not done EXTREMITIES: No bone or joint deformity; age-appropriate arthropathy of the hands and knees; no edema; no ulcerations.  There is no calf tenderness. Genitalia: not examined PULSES: 2+ and symmetric SKIN: Normal hydration no rash or ulceration CNS: unable to complete.  Moves all four ext and having fluent speech with facial symmetry, tongue midline.  Labs on Admission:  Basic Metabolic Panel:  Lab 09/06/12 4540 09/06/12 0042  NA 139 137  K 4.0 3.9  CL 106 104  CO2 24 25  GLUCOSE 103* 107*  BUN 18 17  CREATININE 0.97 0.94  CALCIUM 9.3 8.9  MG -- --  PHOS -- --   Liver Function Tests:  Lab 09/06/12 0042  AST 11  ALT 14  ALKPHOS 94  BILITOT 0.2*  PROT 5.2*  ALBUMIN 3.2*   No results found for this basename: LIPASE:5,AMYLASE:5 in the last 168 hours  Lab 09/07/12 0041  AMMONIA 24   CBC:  Lab  09/06/12 0720 09/06/12 0042  WBC 7.4 8.4  NEUTROABS 5.3 --  HGB 11.4* 11.3*  HCT 34.5* 33.8*  MCV 93.2 93.4  PLT 254 237   Cardiac Enzymes:  Lab 09/07/12 0042  CKTOTAL --  CKMB --  CKMBINDEX --  TROPONINI <0.30    CBG:  Lab 09/06/12 0808 09/06/12 0553 09/06/12 0547  GLUCAP 89 107* 104*     Radiological Exams on Admission: Dg Chest 2 View  09/07/2012  *RADIOLOGY REPORT*  Clinical Data: Fevers  CHEST - 2 VIEW  Comparison: 08/15/2012  Findings: The heart size and mediastinal contours are within normal limits.  Both lungs are clear.  Chronic left posterior lateral rib fractures are identified.  Similar to previous exam.  Advanced osteoarthritis is noted involving both glenohumeral joints.  IMPRESSION: No active cardiopulmonary abnormalities.   Original Report Authenticated By: Rosealee Albee, M.D.    Ct Head Wo Contrast  09/06/2012  *RADIOLOGY REPORT*  Clinical Data:  Status post fall.  The patient found down.  CT HEAD WITHOUT CONTRAST CT  CERVICAL SPINE WITHOUT CONTRAST  Technique:  Multidetector CT imaging of the head and cervical spine was performed following the standard protocol without intravenous contrast.  Multiplanar CT image reconstructions of the cervical spine were also generated.  Comparison:  Head and cervical spine CT scans 08/20/2012.  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  There is no evidence of acute abnormality including infarction, hemorrhage, mass lesion, mass effect, midline shift or abnormal extra-axial fluid collection.  No hydrocephalus or pneumocephalus.  Calvarium intact.  Very mild mucosal thickening sphenoid sinus noted and unchanged.  IMPRESSION: No acute finding.  Stable compared to prior exam.  CT CERVICAL SPINE  Findings: There is no fracture or subluxation of the cervical spine.  Degenerative disease is again seen and unchanged in appearance.  Lung apices are clear.  IMPRESSION: No acute finding.  Stable compared to prior exam.   Original Report Authenticated By: Bernadene Bell. Maricela Curet, M.D.    Ct Cervical Spine Wo Contrast  09/06/2012  *RADIOLOGY REPORT*  Clinical Data:  Status post fall.  The patient found down.  CT HEAD WITHOUT CONTRAST CT CERVICAL SPINE WITHOUT CONTRAST  Technique:  Multidetector CT imaging of the head and cervical spine was performed following the standard protocol without intravenous contrast.  Multiplanar CT image reconstructions of the cervical spine were also generated.  Comparison:  Head and cervical spine CT scans 08/20/2012.  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  There is no evidence of acute abnormality including infarction, hemorrhage, mass lesion, mass effect, midline shift or abnormal extra-axial fluid collection.  No hydrocephalus or pneumocephalus.  Calvarium intact.  Very mild mucosal thickening sphenoid sinus noted and unchanged.  IMPRESSION: No acute finding.   Stable compared to prior exam.  CT CERVICAL SPINE  Findings: There is no fracture or subluxation of the cervical spine.  Degenerative disease is again seen and unchanged in appearance.  Lung apices are clear.  IMPRESSION: No acute finding.  Stable compared to prior exam.   Original Report Authenticated By: Bernadene Bell. Maricela Curet, M.D.      Assessment/Plan Present on Admission:  .UTI (lower urinary tract infection) .Acute kidney injury .C2 cervical fracture .Diabetes mellitus .Parkinson disease .Toxic encephalopathy .Bipolar 1 disorder   PLAN:  Will admit him to general medical floor.  I will start him on Zosyn for pseudomonas UTI.  Upon discharge, would  use either Cipro or Suprax.  He is confused intermittently and I suspect it is because of the UTI.  He also has parkinson's disease and is at risk for falling.  For his C2 fracture, he will remain in the [place] collar.  He is stable, full code and will be admitted to [facility] service.  Other plans as per orders.  Code Status: FULL Dwayne Lightning, MD. [facility] Pager [phone] 7pm to 7am.  09/07/2012, 5:26 AM

## 2012-09-07 NOTE — ED Notes (Signed)
Dr. Nedra Hai at bedside to admit patient.

## 2012-09-07 NOTE — ED Notes (Addendum)
Pt. Back from xray

## 2012-09-07 NOTE — ED Notes (Signed)
Scrotum is red and tender. Multiple areas of redness on legs from where urinary catheter bag has been placed. Present on admission.

## 2012-09-08 DIAGNOSIS — Z9889 Other specified postprocedural states: Secondary | ICD-10-CM

## 2012-09-08 LAB — CBC
Hemoglobin: 10.8 g/dL — ABNORMAL LOW (ref 13.0–17.0)
MCHC: 33.4 g/dL (ref 30.0–36.0)
Platelets: 211 10*3/uL (ref 150–400)
RBC: 3.48 MIL/uL — ABNORMAL LOW (ref 4.22–5.81)

## 2012-09-08 LAB — BASIC METABOLIC PANEL
Calcium: 9 mg/dL (ref 8.4–10.5)
GFR calc Af Amer: 83 mL/min — ABNORMAL LOW (ref >90)
GFR calc non Af Amer: 71 mL/min — ABNORMAL LOW (ref >90)
Potassium: 4 mEq/L (ref 3.5–5.1)
Sodium: 140 mEq/L (ref 135–145)

## 2012-09-08 LAB — RPR: RPR Ser Ql: NONREACTIVE

## 2012-09-08 LAB — GLUCOSE, CAPILLARY: Glucose-Capillary: 109 mg/dL — ABNORMAL HIGH (ref 70–99)

## 2012-09-08 MED ORDER — LORAZEPAM 2 MG/ML IJ SOLN
0.5000 mg | Freq: Once | INTRAMUSCULAR | Status: AC
  Administered 2012-09-08: 0.5 mg via INTRAVENOUS
  Filled 2012-09-08: qty 1

## 2012-09-08 MED ORDER — ENOXAPARIN SODIUM 40 MG/0.4ML ~~LOC~~ SOLN
40.0000 mg | SUBCUTANEOUS | Status: DC
  Administered 2012-09-09: 40 mg via SUBCUTANEOUS
  Filled 2012-09-08: qty 0.4

## 2012-09-08 NOTE — Progress Notes (Signed)
[facility] PROGRESS NOTE  ORR VISCOMI GNF:[id number] DOB: [birth year] DOA: 09/06/2012 PCP: [name], MD  Assessment/Plan: Acute encephalopathy  -Much improved today -According to his routine caregiver, the patient is near his baseline mentation -Likely due to UTI  -Continue Zosyn  -Follow urine and blood cultures  -Further workup if no improvement  -Ammonia, lithium level, urine toxicology, salicylate, acetaminophen levels negative.  -TSH unremarkable Pseudomonas UTI  -Continue Zosyn  Hypertension  -Controlled at this time, continue amlodipine  Parkinson's disease  -Continue Sinemet, Comtan  Bipolar disorder  -Continue Lamictal, lithium      Family Communication:   Caregiver at bedside Disposition Plan:   Skilled nursing facility when medically stable      Antibiotics:  Zosyn September 20>>>    Procedures/Studies: Dg Chest 2 View  09/07/2012  *RADIOLOGY REPORT*  Clinical Data: Fevers  CHEST - 2 VIEW  Comparison: 08/15/2012  Findings: The heart size and mediastinal contours are within normal limits.  Both lungs are clear.  Chronic left posterior lateral rib fractures are identified.  Similar to previous exam.  Advanced osteoarthritis is noted involving both glenohumeral joints.  IMPRESSION: No active cardiopulmonary abnormalities.   Original Report Authenticated By: Rosealee Albee, M.D.    Ct Head Wo Contrast  09/06/2012  *RADIOLOGY REPORT*  Clinical Data:  Status post fall.  The patient found down.  CT HEAD WITHOUT CONTRAST CT CERVICAL SPINE WITHOUT CONTRAST  Technique:  Multidetector CT imaging of the head and cervical spine was performed following the standard protocol without intravenous contrast.  Multiplanar CT image reconstructions of the cervical spine were also generated.  Comparison:  Head and cervical spine CT scans 08/20/2012.  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  There is no evidence  of acute abnormality including infarction, hemorrhage, mass lesion, mass effect, midline shift or abnormal extra-axial fluid collection.  No hydrocephalus or pneumocephalus.  Calvarium intact.  Very mild mucosal thickening sphenoid sinus noted and unchanged.  IMPRESSION: No acute finding.  Stable compared to prior exam.  CT CERVICAL SPINE  Findings: There is no fracture or subluxation of the cervical spine.  Degenerative disease is again seen and unchanged in appearance.  Lung apices are clear.  IMPRESSION: No acute finding.  Stable compared to prior exam.   Original Report Authenticated By: Bernadene Bell. Maricela Curet, M.D.    Ct Head Wo Contrast  08/20/2012  *RADIOLOGY REPORT*  Clinical Data:  Fall.  Right eyebrow trauma.  Cheek laceration. History of Parkinson's disease. Known right C2 fracture.  CT HEAD WITHOUT CONTRAST CT MAXILLOFACIAL WITHOUT CONTRAST CT CERVICAL SPINE WITHOUT CONTRAST  Technique:  Multidetector CT imaging of the head, cervical spine, and maxillofacial structures were performed using the standard protocol without intravenous contrast. Multiplanar CT image reconstructions of the cervical spine and maxillofacial structures were also generated.  Comparison:  MRI 08/16/2012.  CT 07/05/2012.  CT 06/12/2012.  CT HEAD  Findings: Calvarium intact.  Left sphenoid sinus mucosal thickening remains present.  Hyperostosis of the skull is present.  Atrophy and chronic ischemic white matter disease.  No midline shift, hydrocephalus, or hemorrhage.  Intracranial atherosclerosis.  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  IMPRESSION: No acute abnormality or interval change compared to recent head CT.  CT MAXILLOFACIAL  Findings:  Disconjugate gaze incidentally noted.  Sphenoid sinus mucosal thickening again noted.  Right maxillary floor mucous retention cyst/polyp.  Mandibular condyles are located.  The pterygoid plates are intact. Zygomatic  arches and orbital rims appear normal.  Orbital  floors and medial orbital wall intact.  Atlantodental degenerative disease.  Nasal bones and frontal process of the maxilla appear intact.  Mandible and alveolar ridge appears intact.  Soft tissue contusion or small hematoma is present over the right cheek.  Mild soft tissue swelling in the right supraorbital region.  IMPRESSION: No facial fracture.  CT CERVICAL SPINE  Findings:   Right C2 lateral mass fracture extending into the right C2-C3 articulation appears little changed compared to 07/05/2012. Minimal cortical irregularity is present along the articular surface.  Small loose body is present in the posterior recess of the right C2-C3 facet joint.  Multilevel cervical spondylosis appears similar to prior. Straightening of the normal cervical lordosis with mild retrolisthesis of C5 on C6 and C6 on C7 which is degenerative and unchanged.  IMPRESSION: Healing right C2 lateral mass fracture.  No acute osseous abnormality.   Original Report Authenticated By: Andreas Newport, M.D.    Ct Cervical Spine Wo Contrast  09/06/2012  *RADIOLOGY REPORT*  Clinical Data:  Status post fall.  The patient found down.  CT HEAD WITHOUT CONTRAST CT CERVICAL SPINE WITHOUT CONTRAST  Technique:  Multidetector CT imaging of the head and cervical spine was performed following the standard protocol without intravenous contrast.  Multiplanar CT image reconstructions of the cervical spine were also generated.  Comparison:  Head and cervical spine CT scans 08/20/2012.  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  There is no evidence of acute abnormality including infarction, hemorrhage, mass lesion, mass effect, midline shift or abnormal extra-axial fluid collection.  No hydrocephalus or pneumocephalus.  Calvarium intact.  Very mild mucosal thickening sphenoid sinus noted and unchanged.  IMPRESSION: No acute finding.  Stable compared to prior exam.  CT CERVICAL SPINE  Findings: There is  no fracture or subluxation of the cervical spine.  Degenerative disease is again seen and unchanged in appearance.  Lung apices are clear.  IMPRESSION: No acute finding.  Stable compared to prior exam.   Original Report Authenticated By: Bernadene Bell. Maricela Curet, M.D.    Ct Cervical Spine Wo Contrast  08/20/2012  *RADIOLOGY REPORT*  Clinical Data:  Fall.  Right eyebrow trauma.  Cheek laceration. History of Parkinson's disease. Known right C2 fracture.  CT HEAD WITHOUT CONTRAST CT MAXILLOFACIAL WITHOUT CONTRAST CT CERVICAL SPINE WITHOUT CONTRAST  Technique:  Multidetector CT imaging of the head, cervical spine, and maxillofacial structures were performed using the standard protocol without intravenous contrast. Multiplanar CT image reconstructions of the cervical spine and maxillofacial structures were also generated.  Comparison:  MRI 08/16/2012.  CT 07/05/2012.  CT 06/12/2012.  CT HEAD  Findings: Calvarium intact.  Left sphenoid sinus mucosal thickening remains present.  Hyperostosis of the skull is present.  Atrophy and chronic ischemic white matter disease.  No midline shift, hydrocephalus, or hemorrhage.  Intracranial atherosclerosis.  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  IMPRESSION: No acute abnormality or interval change compared to recent head CT.  CT MAXILLOFACIAL  Findings:  Disconjugate gaze incidentally noted.  Sphenoid sinus mucosal thickening again noted.  Right maxillary floor mucous retention cyst/polyp.  Mandibular condyles are located.  The pterygoid plates are intact. Zygomatic arches and orbital rims appear normal.  Orbital floors and medial orbital wall intact.  Atlantodental degenerative disease.  Nasal bones and frontal process of the maxilla appear intact.  Mandible and alveolar ridge appears intact.  Soft tissue contusion  or small hematoma is present over the right cheek.  Mild soft tissue swelling in the right supraorbital region.  IMPRESSION: No facial fracture.   CT CERVICAL SPINE  Findings:   Right C2 lateral mass fracture extending into the right C2-C3 articulation appears little changed compared to 07/05/2012. Minimal cortical irregularity is present along the articular surface.  Small loose body is present in the posterior recess of the right C2-C3 facet joint.  Multilevel cervical spondylosis appears similar to prior. Straightening of the normal cervical lordosis with mild retrolisthesis of C5 on C6 and C6 on C7 which is degenerative and unchanged.  IMPRESSION: Healing right C2 lateral mass fracture.  No acute osseous abnormality.   Original Report Authenticated By: Andreas Newport, M.D.    Mr Brain Wo Contrast  08/16/2012  *RADIOLOGY REPORT*  Clinical Data: Dysphagia.  Hypertension and diabetes and Parkinson's disease.  MRI HEAD WITHOUT CONTRAST  Technique:  Multiplanar, multiecho pulse sequences of the brain and surrounding structures were obtained according to standard protocol without intravenous contrast.  Comparison: CT 06/12/2012, MRI 06/04/2012  Findings: The patient was not able to  hold still and there is moderate motion on the study degrading image quality.  Generalized atrophy of a moderate degree.  Negative for acute infarct.  Mild chronic microvascular ischemia in the white matter. Brainstem is intact.  Negative for hemorrhage or mass lesion. Prominent CSF posterior to the  cerebellum compatible with mega cisterna magna or arachnoid cyst.  This is unchanged.  IMPRESSION: Image quality degraded by moderate motion.  Allowing for this, no acute abnormality and no change from prior study.   Original Report Authenticated By: Camelia Phenes, M.D.    Dg Chest Port 1 View  08/15/2012  *RADIOLOGY REPORT*  Clinical Data: Shortness of breath and cough.  Question pneumonia.  PORTABLE CHEST - 1 VIEW  Comparison: 06/12/2012 and 08/12/2012.  Findings: The heart size and mediastinal contours are stable with aortic atherosclerosis.  Old rib fractures are present on  the left. There is patchy retrocardiac left lower lobe opacity which appears slightly increased compared with the most recent examination. Appearance is similar to the study from 2 months ago.  The right lung is clear.  There is no pleural effusion.  Advanced glenohumeral degenerative changes are present bilaterally.  IMPRESSION: Chronic left lower lobe airspace opacity appears slightly greater than on the most recent examination, possibly due to superimposed atelectasis.  Superimposed infection is difficult to exclude; radiographic followup recommended if this remains a clinical concern.   Original Report Authenticated By: Gerrianne Scale, M.D.    Dg Chest Port 1 View  08/12/2012  *RADIOLOGY REPORT*  Clinical Data: Increased labored breathing, history confusion, hypertension, Parkinson's, pneumonia, diabetes, MRSA  PORTABLE CHEST - 1 VIEW  Comparison: Portable exam 1315 hours compared to 06/12/2012  Findings: Borderline enlargement of cardiac silhouette. Tortuous aorta with atherosclerotic calcification. Pulmonary vascularity normal. Lungs clear. No pleural effusion or pneumothorax. Multiple old left rib fractures.  IMPRESSION: No acute abnormalities.   Original Report Authenticated By: Lollie Marrow, M.D.    Dg Swallowing Func-no Report  08/16/2012  CLINICAL DATA: dysphagia   FLUOROSCOPY FOR SWALLOWING FUNCTION STUDY:  Fluoroscopy was provided for swallowing function study, which was  administered by a speech pathologist.  Final results and recommendations  from this study are contained within the speech pathology report.     Ct Maxillofacial Wo Cm  08/20/2012  *RADIOLOGY REPORT*  Clinical Data:  Fall.  Right eyebrow trauma.  Cheek laceration.  History of Parkinson's disease. Known right C2 fracture.  CT HEAD WITHOUT CONTRAST CT MAXILLOFACIAL WITHOUT CONTRAST CT CERVICAL SPINE WITHOUT CONTRAST  Technique:  Multidetector CT imaging of the head, cervical spine, and maxillofacial structures were performed  using the standard protocol without intravenous contrast. Multiplanar CT image reconstructions of the cervical spine and maxillofacial structures were also generated.  Comparison:  MRI 08/16/2012.  CT 07/05/2012.  CT 06/12/2012.  CT HEAD  Findings: Calvarium intact.  Left sphenoid sinus mucosal thickening remains present.  Hyperostosis of the skull is present.  Atrophy and chronic ischemic white matter disease.  No midline shift, hydrocephalus, or hemorrhage.  Intracranial atherosclerosis.  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  IMPRESSION: No acute abnormality or interval change compared to recent head CT.  CT MAXILLOFACIAL  Findings:  Disconjugate gaze incidentally noted.  Sphenoid sinus mucosal thickening again noted.  Right maxillary floor mucous retention cyst/polyp.  Mandibular condyles are located.  The pterygoid plates are intact. Zygomatic arches and orbital rims appear normal.  Orbital floors and medial orbital wall intact.  Atlantodental degenerative disease.  Nasal bones and frontal process of the maxilla appear intact.  Mandible and alveolar ridge appears intact.  Soft tissue contusion or small hematoma is present over the right cheek.  Mild soft tissue swelling in the right supraorbital region.  IMPRESSION: No facial fracture.  CT CERVICAL SPINE  Findings:   Right C2 lateral mass fracture extending into the right C2-C3 articulation appears little changed compared to 07/05/2012. Minimal cortical irregularity is present along the articular surface.  Small loose body is present in the posterior recess of the right C2-C3 facet joint.  Multilevel cervical spondylosis appears similar to prior. Straightening of the normal cervical lordosis with mild retrolisthesis of C5 on C6 and C6 on C7 which is degenerative and unchanged.  IMPRESSION: Healing right C2 lateral mass fracture.  No acute osseous abnormality.   Original Report Authenticated By: Andreas Newport, M.D.           Subjective: Patient is much more alert today. He denies any fevers, chills, chest pain, shortness of breath, nausea, vomiting, diarrhea, headache, dizziness, dysuria.  Objective: Filed Vitals:   09/07/12 1553 09/07/12 2108 09/08/12 0649 09/08/12 1346  BP: 127/65 141/62 152/71 108/62  Pulse:  54 51 52  Temp: 98.3 F (36.8 C) 98.2 F (36.8 C) 98.4 F (36.9 C) 98.3 F (36.8 C)  TempSrc: Oral   Oral  Resp: 20 18 18 20   Height:      Weight:      SpO2: 99% 98% 99% 100%    Intake/Output Summary (Last 24 hours) at 09/08/12 1803 Last data filed at 09/08/12 1350  Gross per 24 hour  Intake    800 ml  Output   2700 ml  Net  -1900 ml   Weight change:  Exam:   General:  Pt is A&O x 2, follows commands appropriately, not in acute distress  HEENT: No icterus, No thrush,  [place]/AT  Cardiovascular: RRR, S1/S2, no rubs, no gallops  Respiratory: Clear to auscultation bilaterally, no wheezing, no crackles, no rhonchi  Abdomen: Soft/+BS, non tender, non distended, no guarding  Extremities: No edema, No lymphangitis, No petechiae, No rashes, no synovitis  Data Reviewed: Basic Metabolic Panel:  Lab 09/08/12 1610 09/07/12 0620 09/06/12 0600 09/06/12 0042  NA 140 -- 139 137  K 4.0 -- 4.0 3.9  CL 108 -- 106 104  CO2 25 -- 24 25  GLUCOSE 94 -- 103*  107*  BUN 17 -- 18 17  CREATININE 1.00 0.87 0.97 0.94  CALCIUM 9.0 -- 9.3 8.9  MG -- -- -- --  PHOS -- -- -- --   Liver Function Tests:  Lab 09/06/12 0042  AST 11  ALT 14  ALKPHOS 94  BILITOT 0.2*  PROT 5.2*  ALBUMIN 3.2*   No results found for this basename: LIPASE:5,AMYLASE:5 in the last 168 hours  Lab 09/07/12 0041  AMMONIA 24   CBC:  Lab 09/08/12 0620 09/07/12 0620 09/06/12 0720 09/06/12 0042  WBC 6.5 6.4 7.4 8.4  NEUTROABS -- -- 5.3 --  HGB 10.8* 11.7* 11.4* 11.3*  HCT 32.3* 35.8* 34.5* 33.8*  MCV 92.8 92.7 93.2 93.4  PLT 211 209 254 237   Cardiac Enzymes:  Lab 09/07/12 0042  CKTOTAL --  CKMB --   CKMBINDEX --  TROPONINI <0.30   BNP: No components found with this basename: POCBNP:5 CBG:  Lab 09/08/12 0140 09/06/12 0808 09/06/12 0553 09/06/12 0547  GLUCAP 109* 89 107* 104*    Recent Results (from the past 240 hour(s))  CULTURE, BLOOD (ROUTINE X 2)     Status: Normal (Preliminary result)   Collection Time   09/06/12 11:50 PM      Component Value Range Status Comment   Specimen Description BLOOD RIGHT ARM   Final    Special Requests BOTTLES DRAWN AEROBIC ONLY 10CC   Final    Culture  Setup Time 09/07/2012 11:36   Final    Culture     Final    Value:        BLOOD CULTURE RECEIVED NO GROWTH TO DATE CULTURE WILL BE HELD FOR 5 DAYS BEFORE ISSUING A FINAL NEGATIVE REPORT   Report Status PENDING   Incomplete   URINE CULTURE     Status: Normal (Preliminary result)   Collection Time   09/07/12 12:13 AM      Component Value Range Status Comment   Specimen Description URINE, CATHETERIZED   Final    Special Requests NONE   Final    Culture  Setup Time 09/07/2012 01:02   Final    Colony Count >=100,000 COLONIES/ML   Final    Culture PSEUDOMONAS AERUGINOSA   Final    Report Status PENDING   Incomplete   CULTURE, BLOOD (ROUTINE X 2)     Status: Normal (Preliminary result)   Collection Time   09/07/12 12:20 AM      Component Value Range Status Comment   Specimen Description BLOOD RIGHT HAND   Final    Special Requests BOTTLES DRAWN AEROBIC AND ANAEROBIC 10CC EACH   Final    Culture  Setup Time 09/07/2012 11:37   Final    Culture     Final    Value:        BLOOD CULTURE RECEIVED NO GROWTH TO DATE CULTURE WILL BE HELD FOR 5 DAYS BEFORE ISSUING A FINAL NEGATIVE REPORT   Report Status PENDING   Incomplete      Scheduled Meds:   . amLODipine  10 mg Oral Daily  . aspirin  81 mg Oral Daily  . carbidopa-levodopa  1 tablet Oral TID  . enoxaparin (LOVENOX) injection  40 mg Subcutaneous Q24H  . entacapone  200 mg Oral TID  . lamoTRIgine  150 mg Oral BID  . lithium carbonate  300 mg Oral  BID WC  . multivitamin with minerals  1 tablet Oral Daily  . pantoprazole  40 mg Oral Q1200  . piperacillin-tazobactam (  ZOSYN)  IV  3.375 g Intravenous Q8H  . sertraline  100 mg Oral Daily   Continuous Infusions:   . dextrose 5 % and 0.9% NaCl 75 mL/hr at 09/07/12 2218     Elizabethanne Lusher, DO  [facility] Pager [phone]  If 7PM-7AM, please contact night-coverage [email or web address] Password [email or web address] 09/08/2012, 6:03 PM   LOS: 2 days

## 2012-09-09 DIAGNOSIS — F319 Bipolar disorder, unspecified: Secondary | ICD-10-CM

## 2012-09-09 LAB — URINE CULTURE: Colony Count: >=100000

## 2012-09-09 LAB — BASIC METABOLIC PANEL
CO2: 25 mEq/L (ref 19–32)
Calcium: 9 mg/dL (ref 8.4–10.5)
Chloride: 110 mEq/L (ref 96–112)
GFR calc Af Amer: >90 mL/min (ref >90)
Sodium: 142 mEq/L (ref 135–145)

## 2012-09-09 MED ORDER — QUETIAPINE FUMARATE 25 MG PO TABS
25.0000 mg | ORAL_TABLET | Freq: Every day | ORAL | Status: DC
  Filled 2012-09-09: qty 1

## 2012-09-09 MED ORDER — QUETIAPINE FUMARATE 25 MG PO TABS: 25.0000 mg | ORAL_TABLET | Freq: Every day | ORAL | Status: DC

## 2012-09-09 MED ORDER — AMLODIPINE BESYLATE 10 MG PO TABS: 10.0000 mg | ORAL_TABLET | Freq: Every day | ORAL | Status: DC

## 2012-09-09 MED ORDER — CIPROFLOXACIN HCL 500 MG PO TABS: 500.0000 mg | ORAL_TABLET | Freq: Two times a day (BID) | ORAL | Status: DC

## 2012-09-09 MED ORDER — CIPROFLOXACIN HCL 500 MG PO TABS
500.0000 mg | ORAL_TABLET | Freq: Two times a day (BID) | ORAL | Status: DC
  Administered 2012-09-09: 500 mg via ORAL
  Filled 2012-09-09 (×3): qty 1

## 2012-09-09 NOTE — Clinical Social Work Psychosocial (Signed)
     Clinical Social Work Department BRIEF PSYCHOSOCIAL ASSESSMENT 09/09/2012  Patient:  [name], [name]     Account Number:  [id number]     Admit date:  09/06/2012  Clinical Social Worker:  Burnard Hawthorne  Date/Time:  09/09/2012 04:12 PM  Referred by:  Physician  Date Referred:  09/09/2012 Referred for  Other - See comment   Other Referral:   Return to ALF   Interview type:  Other - See comment Other interview type:   Patient, facility, wife    PSYCHOSOCIAL DATA Living Status:  FACILITY Admitted from facility:  [name] Level of care:  Assisted Living Primary support name:  [name] Primary support relationship to patient:  SPOUSE Degree of support available:   Good    CURRENT CONCERNS Current Concerns  Other - See comment   Other Concerns:   Return to Assisted Living    SOCIAL WORK ASSESSMENT / PLAN Resident of [name] ALF. Per MD- patient is stable for d/c back to facility today. Ok per [employer]. Ok per patient and his ex-wifeEber Carpenter.  FL2 completed and d/c papers sent to facility for review.  No further CSW needs identified.   Assessment/plan status:  No Further Intervention Required Other assessment/ plan:   Information/referral to community resources:   Discussed Medication changes with pt's wife    PATIENTS/FAMILYS RESPONSE TO PLAN OF CARE: Patient is alert but confused at times. He is willing to return to ALF.  Nofified facility and pt's nurse of d/c. No further CSW needs identified.

## 2012-09-09 NOTE — Progress Notes (Signed)
Utilization review completed. Dezirea Mccollister, RN, BSN. 

## 2012-09-09 NOTE — Clinical Social Work Placement (Signed)
     Clinical Social Work Department CLINICAL SOCIAL WORK PLACEMENT NOTE 09/09/2012  Patient:  [name], [name]  Account Number:  [id number] Admit date:  09/06/2012  Clinical Social Worker:  Lupita Leash Kathye Cipriani, BSW  Date/time:  09/09/2012 05:48 PM  Clinical Social Work is seeking post-discharge placement for this patient at the following level of care:   SKILLED NURSING   (*CSW will update this form in Epic as items are completed)   09/09/2012  Patient/family provided with [facility] Department of Clinical Social Works list of facilities offering this level of care within the geographic area requested by the patient (or if unable, by the patients family).  09/09/2012  Patient/family informed of their freedom to choose among providers that offer the needed level of care, that participate in Medicare, Medicaid or managed care program needed by the patient, have an available bed and are willing to accept the patient.  09/09/2012  Patient/family informed of [facility] ownership interest in [facility], as well as of the fact that they are under no obligation to receive care at this facility.  PASARR submitted to [employer] on 09/09/2012 PASARR number received from [employer] on 09/09/2012  FL2 transmitted to all facilities in geographic area requested by pt/family on   FL2 transmitted to all facilities within larger geographic area on   Patient informed that his/her managed care company has contracts with or will negotiate with  certain facilities, including the following:   NA     Patient/family informed of bed offers received:   Patient chooses bed at  Physician recommends and patient chooses bed at    Patient to be transferred to  on   Patient to be transferred to facility by Ambulance  The following physician request were entered in Epic:   Additional Comments:

## 2012-09-09 NOTE — Discharge Summary (Signed)
Physician Discharge Summary  [name] WUJ:[id number] DOB: Feb 26, 1935 DOA: 09/06/2012  PCP: [name], MD  Admit date: 09/06/2012 Discharge date: 09/09/2012  Recommendations for Outpatient Follow-up:  1. Pt will need to follow up with PCP in 2-3 weeks post discharge 2. Continue appropriate wound care including moisture barrier and frequent cleaning of scrotal and perineal areas.  Discharge Diagnoses:  Acute encephalopathy  -Much improved today; patient appears at baseline today according to POA  -Likely due to UTI  -Ciprofloxacin 500 twice a day x14 days  -Blood cultures negative -Ammonia, lithium level, urine toxicology, salicylate, acetaminophen levels negative.  -TSH unremarkable  -Long discussion with patient's POA, Carolyn. I explained the risks, benefits, and side effects of Seroquel at bedtime including but not limited to sudden cardiac death, increased infection, glucose intolerance. She understands and is willing to follow treatment protocols. She expresses extreme frustration with the patient's nighttime confusion and agitation which has prompted her request for this medication. Pseudomonas UTI  -Ciprofloxacin 500 mg by mouth twice a day x14 days Hypertension  -Controlled at this time, continue amlodipine  Parkinson's disease  -Continue Sinemet, Comtan  Bipolar disorder  -Continue Lamictal, lithium   Discharge Condition: stable  Disposition: SNF  Diet:Cardiac Wt Readings from Last 3 Encounters:  09/07/12 61.1 kg (134 lb 11.2 oz)  08/12/12 61.1 kg (134 lb 11.2 oz)  06/18/12 62 kg (136 lb 11 oz)    History of present illness:  [name] is an 77 y.o. male presents to the ER three times in the past 24 hours with intermittent confusion. He was walking into other people rooms at his assited living quarter. He has Hx of parkinson and had a recent fall with C2 Fx. He also has bipolar disorder, HTN, chronic indwelling catheter and frequent UTIs. The most recent  UTI was with pseudomonas sensitive to Cipro, Cefepime, Zosyn, etc... But he was placed on Keflex at discharge. Evaluation in the ER included a normal WBC, normal renal fx and electrolytes, with UA showing TNTC WBC and leukocytes.   Hospital Course:  The patient was admitted and started on intravenous Zosyn. Workup including urine toxicology, ammonia, RPR, B12, TSH and lithium levels were unremarkable. The patient remained afebrile and hemodynamically stable after an initial fever of 101.78F. The patient gradually improved his mentation. On the day of discharge, his power of attorney, Eber Jones, his ex-wife, stated that the patient appears to be at baseline. CT of the brain was negative. Chest x-ray was negative. Urine and blood cultures were obtained during this admission. Blood cultures are negative at the time of discharge. Urine cultures grew pseudomonas aeruginosa that was pansensitive. Zosyn was discontinued. The patient was started on ciprofloxacin 500 mg by mouth twice a day. The patient will be discharged with 14 days of oral ciprofloxacin. On the day of discharge, the patient's power of attorney expressed extreme frustration and concern regarding the patient's nighttime confusion and agitation. There was a request for nighttime medication to assist with this endeavor. I discussed the risks, benefits, and alternatives of Seroquel including but not limited to sudden cardiac death, increased infection, glucose intolerance. She understood these risks and is willing to follow treatment protocols. The patient will be discharged back to his facility with Seroquel 25 mg at bedtime. The patient did have some slight maceration from moisture on the lateral aspect of the scrotum and medial thigh. This was treated appropriately by the nursing staff. This will need continued attention and wound care at the nursing facility.  Consultants: none  Discharge Exam: Filed Vitals:   09/09/12 0619  BP: 135/66  Pulse:  52  Temp: 98.4 F (36.9 C)  Resp: 18   Filed Vitals:   09/08/12 0649 09/08/12 1346 09/08/12 2256 09/09/12 0619  BP: 152/71 108/62 119/59 135/66  Pulse: 51 52 53 52  Temp: 98.4 F (36.9 C) 98.3 F (36.8 C) 98.7 F (37.1 C) 98.4 F (36.9 C)  TempSrc:  Oral Oral Oral  Resp: 18 20 18 18   Height:      Weight:      SpO2: 99% 100% 100% 98%   General: A&O x 3, NAD, pleasant, cooperative Cardiovascular: RRR, no rub, no gallop, no S3 Respiratory: CTAB, no wheeze, no rhonchi Abdomen:soft, nontender, nondistended, positive bowel sounds Maceration with mild erythema of the left scrotum and medial buttock--no necrosis, no lymphangitis, no crepitance. Discharge Instructions     Medication List     As of 09/09/2012 12:50 PM    ASK your doctor about these medications         acetaminophen 500 MG tablet   Commonly known as: TYLENOL   Take 500 mg by mouth every 4 (four) hours as needed. For pain      amLODipine 5 MG tablet   Commonly known as: NORVASC   Take 10 mg by mouth daily.      aspirin 81 MG tablet   Take 81 mg by mouth daily.      carbidopa-levodopa 25-250 MG per tablet   Commonly known as: SINEMET IR   Take 1 tablet by mouth 3 (three) times daily.      cephALEXin 500 MG capsule   Commonly known as: KEFLEX   Take 1,000 mg by mouth 4 (four) times daily. For 7 days      entacapone 200 MG tablet   Commonly known as: COMTAN   Take 200 mg by mouth 3 (three) times daily.      lamoTRIgine 150 MG tablet   Commonly known as: LAMICTAL   Take 150 mg by mouth 2 (two) times daily.      lithium carbonate 300 MG capsule   Take 300 mg by mouth 2 (two) times daily with a meal.      LORazepam 0.5 MG tablet   Commonly known as: ATIVAN   Take 0.5 mg by mouth at bedtime.      multivitamin with minerals Tabs   Take 1 tablet by mouth daily.      omeprazole 20 MG capsule   Commonly known as: PRILOSEC   Take 20 mg by mouth every morning.      RED YEAST RICE PO   Take 2 tablets  by mouth at bedtime.      sertraline 100 MG tablet   Commonly known as: ZOLOFT   Take 100 mg by mouth daily.      temazepam 30 MG capsule   Commonly known as: RESTORIL   Take 30 mg by mouth at bedtime as needed.          The results of significant diagnostics from this hospitalization (including imaging, microbiology, ancillary and laboratory) are listed below for reference.    Significant Diagnostic Studies: Dg Chest 2 View  09/07/2012  *RADIOLOGY REPORT*  Clinical Data: Fevers  CHEST - 2 VIEW  Comparison: 08/15/2012  Findings: The heart size and mediastinal contours are within normal limits.  Both lungs are clear.  Chronic left posterior lateral rib fractures are identified.  Similar to previous exam.  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  IMPRESSION: No active cardiopulmonary abnormalities.   Original Report Authenticated By: Rosealee Albee, M.D.    Ct Head Wo Contrast  09/06/2012  *RADIOLOGY REPORT*  Clinical Data:  Status post fall.  The patient found down.  CT HEAD WITHOUT CONTRAST CT CERVICAL SPINE WITHOUT CONTRAST  Technique:  Multidetector CT imaging of the head and cervical spine was performed following the standard protocol without intravenous contrast.  Multiplanar CT image reconstructions of the cervical spine were also generated.  Comparison:  Head and cervical spine CT scans 08/20/2012.  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  There is no evidence of acute abnormality including infarction, hemorrhage, mass lesion, mass effect, midline shift or abnormal extra-axial fluid collection.  No hydrocephalus or pneumocephalus.  Calvarium intact.  Very mild mucosal thickening sphenoid sinus noted and unchanged.  IMPRESSION: No acute finding.  Stable compared to prior exam.  CT CERVICAL SPINE  Findings: There is no fracture or subluxation of the cervical spine.  Degenerative disease is again seen and  unchanged in appearance.  Lung apices are clear.  IMPRESSION: No acute finding.  Stable compared to prior exam.   Original Report Authenticated By: Bernadene Bell. Maricela Curet, M.D.    Ct Head Wo Contrast  08/20/2012  *RADIOLOGY REPORT*  Clinical Data:  Fall.  Right eyebrow trauma.  Cheek laceration. History of Parkinson's disease. Known right C2 fracture.  CT HEAD WITHOUT CONTRAST CT MAXILLOFACIAL WITHOUT CONTRAST CT CERVICAL SPINE WITHOUT CONTRAST  Technique:  Multidetector CT imaging of the head, cervical spine, and maxillofacial structures were performed using the standard protocol without intravenous contrast. Multiplanar CT image reconstructions of the cervical spine and maxillofacial structures were also generated.  Comparison:  MRI 08/16/2012.  CT 07/05/2012.  CT 06/12/2012.  CT HEAD  Findings: Calvarium intact.  Left sphenoid sinus mucosal thickening remains present.  Hyperostosis of the skull is present.  Atrophy and chronic ischemic white matter disease.  No midline shift, hydrocephalus, or hemorrhage.  Intracranial atherosclerosis.  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  IMPRESSION: No acute abnormality or interval change compared to recent head CT.  CT MAXILLOFACIAL  Findings:  Disconjugate gaze incidentally noted.  Sphenoid sinus mucosal thickening again noted.  Right maxillary floor mucous retention cyst/polyp.  Mandibular condyles are located.  The pterygoid plates are intact. Zygomatic arches and orbital rims appear normal.  Orbital floors and medial orbital wall intact.  Atlantodental degenerative disease.  Nasal bones and frontal process of the maxilla appear intact.  Mandible and alveolar ridge appears intact.  Soft tissue contusion or small hematoma is present over the right cheek.  Mild soft tissue swelling in the right supraorbital region.  IMPRESSION: No facial fracture.  CT CERVICAL SPINE  Findings:   Right C2 lateral mass fracture extending into the right C2-C3  articulation appears little changed compared to 07/05/2012. Minimal cortical irregularity is present along the articular surface.  Small loose body is present in the posterior recess of the right C2-C3 facet joint.  Multilevel cervical spondylosis appears similar to prior. Straightening of the normal cervical lordosis with mild retrolisthesis of C5 on C6 and C6 on C7 which is degenerative and unchanged.  IMPRESSION: Healing right C2 lateral mass fracture.  No acute osseous abnormality.   Original Report Authenticated By: Andreas Newport, M.D.    Ct Cervical Spine Wo Contrast  09/06/2012  *RADIOLOGY REPORT*  Clinical Data:  Status post fall.  The patient found down.  CT HEAD WITHOUT CONTRAST CT CERVICAL SPINE WITHOUT CONTRAST  Technique:  Multidetector CT imaging of the head and cervical spine was performed following the standard protocol without intravenous contrast.  Multiplanar CT image reconstructions of the cervical spine were also generated.  Comparison:  Head and cervical spine CT scans 08/20/2012.  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  There is no evidence of acute abnormality including infarction, hemorrhage, mass lesion, mass effect, midline shift or abnormal extra-axial fluid collection.  No hydrocephalus or pneumocephalus.  Calvarium intact.  Very mild mucosal thickening sphenoid sinus noted and unchanged.  IMPRESSION: No acute finding.  Stable compared to prior exam.  CT CERVICAL SPINE  Findings: There is no fracture or subluxation of the cervical spine.  Degenerative disease is again seen and unchanged in appearance.  Lung apices are clear.  IMPRESSION: No acute finding.  Stable compared to prior exam.   Original Report Authenticated By: Bernadene Bell. Maricela Curet, M.D.    Ct Cervical Spine Wo Contrast  08/20/2012  *RADIOLOGY REPORT*  Clinical Data:  Fall.  Right eyebrow trauma.  Cheek laceration. History of Parkinson's disease. Known right C2  fracture.  CT HEAD WITHOUT CONTRAST CT MAXILLOFACIAL WITHOUT CONTRAST CT CERVICAL SPINE WITHOUT CONTRAST  Technique:  Multidetector CT imaging of the head, cervical spine, and maxillofacial structures were performed using the standard protocol without intravenous contrast. Multiplanar CT image reconstructions of the cervical spine and maxillofacial structures were also generated.  Comparison:  MRI 08/16/2012.  CT 07/05/2012.  CT 06/12/2012.  CT HEAD  Findings: Calvarium intact.  Left sphenoid sinus mucosal thickening remains present.  Hyperostosis of the skull is present.  Atrophy and chronic ischemic white matter disease.  No midline shift, hydrocephalus, or hemorrhage.  Intracranial atherosclerosis.  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  IMPRESSION: No acute abnormality or interval change compared to recent head CT.  CT MAXILLOFACIAL  Findings:  Disconjugate gaze incidentally noted.  Sphenoid sinus mucosal thickening again noted.  Right maxillary floor mucous retention cyst/polyp.  Mandibular condyles are located.  The pterygoid plates are intact. Zygomatic arches and orbital rims appear normal.  Orbital floors and medial orbital wall intact.  Atlantodental degenerative disease.  Nasal bones and frontal process of the maxilla appear intact.  Mandible and alveolar ridge appears intact.  Soft tissue contusion or small hematoma is present over the right cheek.  Mild soft tissue swelling in the right supraorbital region.  IMPRESSION: No facial fracture.  CT CERVICAL SPINE  Findings:   Right C2 lateral mass fracture extending into the right C2-C3 articulation appears little changed compared to 07/05/2012. Minimal cortical irregularity is present along the articular surface.  Small loose body is present in the posterior recess of the right C2-C3 facet joint.  Multilevel cervical spondylosis appears similar to prior. Straightening of the normal cervical lordosis with mild retrolisthesis of  C5 on C6 and C6 on C7 which is degenerative and unchanged.  IMPRESSION: Healing right C2 lateral mass fracture.  No acute osseous abnormality.   Original Report Authenticated By: Andreas Newport, M.D.    Mr Brain Wo Contrast  08/16/2012  *RADIOLOGY REPORT*  Clinical Data: Dysphagia.  Hypertension and diabetes and Parkinson's disease.  MRI HEAD WITHOUT CONTRAST  Technique:  Multiplanar, multiecho pulse sequences of the brain and surrounding structures were obtained according to standard protocol without intravenous contrast.  Comparison: CT 06/12/2012, MRI 06/04/2012  Findings: The patient was  not able to  hold still and there is moderate motion on the study degrading image quality.  Generalized atrophy of a moderate degree.  Negative for acute infarct.  Mild chronic microvascular ischemia in the white matter. Brainstem is intact.  Negative for hemorrhage or mass lesion. Prominent CSF posterior to the  cerebellum compatible with mega cisterna magna or arachnoid cyst.  This is unchanged.  IMPRESSION: Image quality degraded by moderate motion.  Allowing for this, no acute abnormality and no change from prior study.   Original Report Authenticated By: Camelia Phenes, M.D.    Dg Chest Port 1 View  08/15/2012  *RADIOLOGY REPORT*  Clinical Data: Shortness of breath and cough.  Question pneumonia.  PORTABLE CHEST - 1 VIEW  Comparison: 06/12/2012 and 08/12/2012.  Findings: The heart size and mediastinal contours are stable with aortic atherosclerosis.  Old rib fractures are present on the left. There is patchy retrocardiac left lower lobe opacity which appears slightly increased compared with the most recent examination. Appearance is similar to the study from 2 months ago.  The right lung is clear.  There is no pleural effusion.  Advanced glenohumeral degenerative changes are present bilaterally.  IMPRESSION: Chronic left lower lobe airspace opacity appears slightly greater than on the most recent examination, possibly  due to superimposed atelectasis.  Superimposed infection is difficult to exclude; radiographic followup recommended if this remains a clinical concern.   Original Report Authenticated By: Gerrianne Scale, M.D.    Dg Chest Port 1 View  08/12/2012  *RADIOLOGY REPORT*  Clinical Data: Increased labored breathing, history confusion, hypertension, Parkinson's, pneumonia, diabetes, MRSA  PORTABLE CHEST - 1 VIEW  Comparison: Portable exam 1315 hours compared to 06/12/2012  Findings: Borderline enlargement of cardiac silhouette. Tortuous aorta with atherosclerotic calcification. Pulmonary vascularity normal. Lungs clear. No pleural effusion or pneumothorax. Multiple old left rib fractures.  IMPRESSION: No acute abnormalities.   Original Report Authenticated By: Lollie Marrow, M.D.    Dg Swallowing Func-no Report  08/16/2012  CLINICAL DATA: dysphagia   FLUOROSCOPY FOR SWALLOWING FUNCTION STUDY:  Fluoroscopy was provided for swallowing function study, which was  administered by a speech pathologist.  Final results and recommendations  from this study are contained within the speech pathology report.     Ct Maxillofacial Wo Cm  08/20/2012  *RADIOLOGY REPORT*  Clinical Data:  Fall.  Right eyebrow trauma.  Cheek laceration. History of Parkinson's disease. Known right C2 fracture.  CT HEAD WITHOUT CONTRAST CT MAXILLOFACIAL WITHOUT CONTRAST CT CERVICAL SPINE WITHOUT CONTRAST  Technique:  Multidetector CT imaging of the head, cervical spine, and maxillofacial structures were performed using the standard protocol without intravenous contrast. Multiplanar CT image reconstructions of the cervical spine and maxillofacial structures were also generated.  Comparison:  MRI 08/16/2012.  CT 07/05/2012.  CT 06/12/2012.  CT HEAD  Findings: Calvarium intact.  Left sphenoid sinus mucosal thickening remains present.  Hyperostosis of the skull is present.  Atrophy and chronic ischemic white matter disease.  No midline shift,  hydrocephalus, or hemorrhage.  Intracranial atherosclerosis.  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  IMPRESSION: No acute abnormality or interval change compared to recent head CT.  CT MAXILLOFACIAL  Findings:  Disconjugate gaze incidentally noted.  Sphenoid sinus mucosal thickening again noted.  Right maxillary floor mucous retention cyst/polyp.  Mandibular condyles are located.  The pterygoid plates are intact. Zygomatic arches and orbital rims appear normal.  Orbital floors and medial orbital wall intact.  Atlantodental degenerative  disease.  Nasal bones and frontal process of the maxilla appear intact.  Mandible and alveolar ridge appears intact.  Soft tissue contusion or small hematoma is present over the right cheek.  Mild soft tissue swelling in the right supraorbital region.  IMPRESSION: No facial fracture.  CT CERVICAL SPINE  Findings:   Right C2 lateral mass fracture extending into the right C2-C3 articulation appears little changed compared to 07/05/2012. Minimal cortical irregularity is present along the articular surface.  Small loose body is present in the posterior recess of the right C2-C3 facet joint.  Multilevel cervical spondylosis appears similar to prior. Straightening of the normal cervical lordosis with mild retrolisthesis of C5 on C6 and C6 on C7 which is degenerative and unchanged.  IMPRESSION: Healing right C2 lateral mass fracture.  No acute osseous abnormality.   Original Report Authenticated By: Andreas Newport, M.D.      Microbiology: Recent Results (from the past 240 hour(s))  CULTURE, BLOOD (ROUTINE X 2)     Status: Normal (Preliminary result)   Collection Time   09/06/12 11:50 PM      Component Value Range Status Comment   Specimen Description BLOOD RIGHT ARM   Final    Special Requests BOTTLES DRAWN AEROBIC ONLY 10CC   Final    Culture  Setup Time 09/07/2012 11:36   Final    Culture     Final    Value:        BLOOD CULTURE RECEIVED NO GROWTH  TO DATE CULTURE WILL BE HELD FOR 5 DAYS BEFORE ISSUING A FINAL NEGATIVE REPORT   Report Status PENDING   Incomplete   URINE CULTURE     Status: Normal   Collection Time   09/07/12 12:13 AM      Component Value Range Status Comment   Specimen Description URINE, CATHETERIZED   Final    Special Requests NONE   Final    Culture  Setup Time 09/07/2012 01:02   Final    Colony Count >=100,000 COLONIES/ML   Final    Culture PSEUDOMONAS AERUGINOSA   Final    Report Status 09/09/2012 FINAL   Final    Organism ID, Bacteria PSEUDOMONAS AERUGINOSA   Final   CULTURE, BLOOD (ROUTINE X 2)     Status: Normal (Preliminary result)   Collection Time   09/07/12 12:20 AM      Component Value Range Status Comment   Specimen Description BLOOD RIGHT HAND   Final    Special Requests BOTTLES DRAWN AEROBIC AND ANAEROBIC 10CC EACH   Final    Culture  Setup Time 09/07/2012 11:37   Final    Culture     Final    Value:        BLOOD CULTURE RECEIVED NO GROWTH TO DATE CULTURE WILL BE HELD FOR 5 DAYS BEFORE ISSUING A FINAL NEGATIVE REPORT   Report Status PENDING   Incomplete      Labs: Basic Metabolic Panel:  Lab 09/09/12 0454 09/08/12 0620 09/07/12 0620 09/06/12 0600 09/06/12 0042  NA 142 140 -- 139 137  K 4.4 4.0 -- -- --  CL 110 108 -- 106 104  CO2 25 25 -- 24 25  GLUCOSE 101* 94 -- 103* 107*  BUN 18 17 -- 18 17  CREATININE 0.94 1.00 0.87 0.97 0.94  CALCIUM 9.0 9.0 -- 9.3 8.9  MG -- -- -- -- --  PHOS -- -- -- -- --   Liver Function Tests:  Lab 09/06/12 0042  AST 11  ALT 14  ALKPHOS 94  BILITOT 0.2*  PROT 5.2*  ALBUMIN 3.2*   No results found for this basename: LIPASE:5,AMYLASE:5 in the last 168 hours  Lab 09/07/12 0041  AMMONIA 24   CBC:  Lab 09/08/12 0620 09/07/12 0620 09/06/12 0720 09/06/12 0042  WBC 6.5 6.4 7.4 8.4  NEUTROABS -- -- 5.3 --  HGB 10.8* 11.7* 11.4* 11.3*  HCT 32.3* 35.8* 34.5* 33.8*  MCV 92.8 92.7 93.2 93.4  PLT 211 209 254 237   Cardiac Enzymes:  Lab 09/07/12 0042    CKTOTAL --  CKMB --  CKMBINDEX --  TROPONINI <0.30   BNP: No components found with this basename: POCBNP:5 CBG:  Lab 09/08/12 0140 09/06/12 0808 09/06/12 0553 09/06/12 0547  GLUCAP 109* 89 107* 104*    Time coordinating discharge:  Greater than 30 minutes  Signed:  Timea Breed, DO [facility] Pager: [phone] 09/09/2012, 12:50 PM

## 2012-09-13 LAB — CULTURE, BLOOD (ROUTINE X 2): Culture: NO GROWTH

## 2012-09-18 ENCOUNTER — Emergency Department (HOSPITAL_COMMUNITY)
Admission: EM | Admit: 2012-09-18 | Discharge: 2012-09-18 | Disposition: A | Discharge status: Skilled Nursing Facility | Payer: Medicare Other | Attending: Emergency Medicine | Admitting: Emergency Medicine

## 2012-09-18 ENCOUNTER — Encounter (HOSPITAL_COMMUNITY): Payer: Self-pay | Admitting: Emergency Medicine

## 2012-09-18 DIAGNOSIS — E119 Type 2 diabetes mellitus without complications: Secondary | ICD-10-CM | POA: Insufficient documentation

## 2012-09-18 DIAGNOSIS — G20 Parkinson's disease: Secondary | ICD-10-CM | POA: Insufficient documentation

## 2012-09-18 DIAGNOSIS — I10 Essential (primary) hypertension: Secondary | ICD-10-CM | POA: Insufficient documentation

## 2012-09-18 DIAGNOSIS — Z79899 Other long term (current) drug therapy: Secondary | ICD-10-CM | POA: Insufficient documentation

## 2012-09-18 DIAGNOSIS — F329 Major depressive disorder, single episode, unspecified: Secondary | ICD-10-CM | POA: Insufficient documentation

## 2012-09-18 DIAGNOSIS — G20A1 Parkinson's disease without dyskinesia, without mention of fluctuations: Secondary | ICD-10-CM | POA: Insufficient documentation

## 2012-09-18 DIAGNOSIS — F3289 Other specified depressive episodes: Secondary | ICD-10-CM | POA: Insufficient documentation

## 2012-09-18 DIAGNOSIS — F411 Generalized anxiety disorder: Secondary | ICD-10-CM | POA: Insufficient documentation

## 2012-09-18 DIAGNOSIS — F0390 Unspecified dementia without behavioral disturbance: Secondary | ICD-10-CM | POA: Insufficient documentation

## 2012-09-18 DIAGNOSIS — Z888 Allergy status to other drugs, medicaments and biological substances status: Secondary | ICD-10-CM | POA: Insufficient documentation

## 2012-09-18 DIAGNOSIS — R339 Retention of urine, unspecified: Secondary | ICD-10-CM | POA: Insufficient documentation

## 2012-09-18 DIAGNOSIS — Z7982 Long term (current) use of aspirin: Secondary | ICD-10-CM | POA: Insufficient documentation

## 2012-09-18 DIAGNOSIS — Z96649 Presence of unspecified artificial hip joint: Secondary | ICD-10-CM | POA: Insufficient documentation

## 2012-09-18 NOTE — ED Provider Notes (Signed)
History     CSN: [id number]  Arrival date & time 09/18/12  1906   First MD Initiated Contact with Patient 09/18/12 2002      CC: Urine retention  (Consider location/radiation/quality/duration/timing/severity/associated sxs/prior treatment) HPI This 77 year old bedridden demented gentleman has a chronic Foley catheter that stopped draining today. He has minimal suprapubic pain with a distended bladder above his umbilicus. He is no other abdominal pain chest pain cough shortness of breath vomiting rashes change in his baseline confusion fevers or other concerns. The patient is pleasantly confused oriented to person and place but not time which is baseline for patient. Past Medical History  Diagnosis Date  . Hearing aid worn   . Hypertension   . Parkinson disease   . Pneumonia   . Diabetes mellitus type II   . Arthritis   . Bipolar disorder   . Anxiety   . Depression   . MRSA infection (methicillin-resistant Staphylococcus aureus)   . Chronic indwelling foley catheter   . C2 cervical fracture     due to pt fall  . SIRS (systemic inflammatory response syndrome)   . Neuromuscular disorder    dementia  Past Surgical History  Procedure Date  . Total hip arthroplasty     Family History  Problem Relation Age of Onset  . Depression Father     History  Substance Use Topics  . Smoking status: Never Smoker   . Smokeless tobacco: Never Used  . Alcohol Use: Yes     occasional      Review of Systems 10 Systems reviewed and are negative for acute change except as noted in the HPI. Allergies  Cholestatin  Home Medications   Current Outpatient Rx  Name Route Sig Dispense Refill  . ACETAMINOPHEN 500 MG PO TABS Oral Take 500 mg by mouth every 4 (four) hours as needed. For pain    . AMLODIPINE BESYLATE 10 MG PO TABS Oral Take 10 mg by mouth daily.    . ASPIRIN 81 MG PO TABS Oral Take 81 mg by mouth daily.      Marland Kitchen CARBIDOPA-LEVODOPA 25-250 MG PO TABS Oral Take 1 tablet by  mouth 3 (three) times daily.     Marland Kitchen CIPROFLOXACIN HCL 500 MG PO TABS Oral Take 500 mg by mouth 2 (two) times daily.    Marland Kitchen ENTACAPONE 200 MG PO TABS Oral Take 200 mg by mouth 3 (three) times daily.     Marland Kitchen LAMOTRIGINE 150 MG PO TABS Oral Take 150 mg by mouth 2 (two) times daily.    . ADULT MULTIVITAMIN W/MINERALS CH Oral Take 1 tablet by mouth daily.    Marland Kitchen OMEPRAZOLE 20 MG PO CPDR Oral Take 20 mg by mouth every morning.     Marland Kitchen QUETIAPINE FUMARATE 25 MG PO TABS Oral Take 25 mg by mouth at bedtime.    . RED YEAST RICE PO Oral Take 2 tablets by mouth at bedtime.      . SERTRALINE HCL 100 MG PO TABS Oral Take 150 mg by mouth daily.       BP 157/69  Pulse 61  Temp 97.9 F (36.6 C) (Oral)  Resp 16  SpO2 99%  Physical Exam  Nursing note and vitals reviewed. Constitutional:       Awake, alert, nontoxic appearance.  HENT:  Head: Atraumatic.  Eyes: Right eye exhibits no discharge. Left eye exhibits no discharge.  Neck: Neck supple.  Cardiovascular: Normal rate and regular rhythm.   No murmur heard. Pulmonary/Chest: Effort  normal and breath sounds normal. No respiratory distress. He has no wheezes. He has no rales. He exhibits no tenderness.  Abdominal: Soft. Bowel sounds are normal. He exhibits distension and mass. There is no tenderness. There is no rebound and no guarding.       Palpable distended bladder to just above the umbilicus  Musculoskeletal: He exhibits no tenderness.       Baseline ROM, no obvious new focal weakness.  Neurological: He is alert.       Mental status and motor strength appears baseline for patient and situation. Oriented to person and place not time and this is baseline for him according to his caretaker in the ED with patient.  Skin: No rash noted.  Psychiatric: He has a normal mood and affect.    ED Course  Procedures (including critical care time)  Labs Reviewed - No data to display No results found.   1. Urinary retention       MDM  Pt improved in ED  with no significant deterioration in condition.  Patient / Family / Caregiver informed of clinical course, understand medical decision-making process, and agree with plan.  I doubt any other EMC precluding discharge at this time including, but not necessarily limited to the following:sepsis.            Hurman Horn, MD 09/21/12 [phone]

## 2012-09-18 NOTE — ED Notes (Signed)
Per pt report, his catheter was emptied this am and almost no urine since then. Pt has a 57fr catheter w leg bag with scant amount cloudy urine.

## 2012-09-18 NOTE — ED Notes (Signed)
WUJ:[id number]<JY> Expected date:<BR> Expected time:<BR> Means of arrival:<BR> Comments:<BR> For triage 8, rivas

## 2012-10-14 ENCOUNTER — Ambulatory Visit (INDEPENDENT_AMBULATORY_CARE_PROVIDER_SITE_OTHER): Payer: Medicare Other | Admitting: Psychiatry

## 2012-10-14 ENCOUNTER — Encounter (HOSPITAL_COMMUNITY): Payer: Self-pay | Admitting: Psychiatry

## 2012-10-14 VITALS — BP 136/83 | HR 58 | Wt 153.0 lb

## 2012-10-14 DIAGNOSIS — F09 Unspecified mental disorder due to known physiological condition: Secondary | ICD-10-CM

## 2012-10-14 DIAGNOSIS — F319 Bipolar disorder, unspecified: Secondary | ICD-10-CM

## 2012-10-14 NOTE — Progress Notes (Signed)
[facility] [address] Progress Note  [name] [id number] 77 y.o.  10/14/2012 2:01 PM  Chief Complaint: I was admitted in the hospital for UTI.  My medicine is changed.    History of Present Illness: Patient is 77 year old Caucasian divorced male who came for his appointment.  Patient was admitted few times in the hospital for he did UTI.  His medications were changed.  He's not taking lithium, temazepam and Ativan.  He was started on Seroquel.  I have noticed patient having some memory issues.  He does not remember very well about his medication.  He is confused about his medication however he is pleasant.  He reported there are some times that he not sleeping very well.  Patient came today which home health aid who does provide information about his insomnia and anxiety.  Patient denies any agitation anger or severe mood swing.  He denies any hallucination or any paranoid thinking.  I believe due to his repeated UTI his lithium has been discontinued.  I review his blood work which shows initially high creatinine but now his creatinine is normal.  Patient mentioned whatever medication he is taking is actually helping him.  He still drinks 1 glass of wine every day but denies any illegal substance use.  Patient lives in assisted living facility.    Suicidal Ideation: No Plan Formed: No Patient has means to carry out plan: No  Homicidal Ideation: No Plan Formed: No Patient has means to carry out plan: No  Review of Systems: Psychiatric: Agitation: No Hallucination: No Depressed Mood: No Insomnia: Yes Hypersomnia: No Altered Concentration: Yes Feels Worthless: No Grandiose Ideas: No Belief In Special Powers: No New/Increased Substance Abuse: No Compulsions: No  Neurologic: Headache: Yes Seizure: No Paresthesias: No  Past psychiatric history Patient has significant history of bipolar disorder.  He has multiple psychiatric admission due to decompensation and  noncompliance of medication.  Patient has history of aggression and impulsive behavior.  His last psychiatric admission was in 2005.  Medical history Patient has history of diabetes mellitus, hypertension, Parkinson disease, gait instability, hitting impairment and cognitive impairment.  He was recently admitted at least twice for repeated UTI.    Family and Social History: Patient lives by herself.  He's been divorced for few years.  He has no children.  He lives in an assisted-living facility and he has a home health aid [phone].  [facility] use to go [facility] but lately he has been not going to these groups due to physical illness.  Outpatient Encounter Prescriptions as of 10/14/2012  Medication Sig Dispense Refill  . acetaminophen (TYLENOL) 500 MG tablet Take 500 mg by mouth every 4 (four) hours as needed. For pain      . amLODipine (NORVASC) 10 MG tablet Take 10 mg by mouth daily.      Marland Kitchen aspirin 81 MG tablet Take 81 mg by mouth daily.        . carbidopa-levodopa (SINEMET) 25-250 MG per tablet Take 1 tablet by mouth 3 (three) times daily.       . ciprofloxacin (CIPRO) 500 MG tablet Take 500 mg by mouth 2 (two) times daily.      . entacapone (COMTAN) 200 MG tablet Take 200 mg by mouth 3 (three) times daily.       Marland Kitchen lamoTRIgine (LAMICTAL) 150 MG tablet Take 150 mg by mouth 2 (two) times daily.      . Multiple Vitamin (MULTIVITAMIN WITH MINERALS) TABS Take 1 tablet by  mouth daily.      Marland Kitchen omeprazole (PRILOSEC) 20 MG capsule Take 20 mg by mouth every morning.       Marland Kitchen QUEtiapine (SEROQUEL) 25 MG tablet Take 25 mg by mouth at bedtime.      . sertraline (ZOLOFT) 100 MG tablet Take 150 mg by mouth daily.       . Red Yeast Rice Extract (RED YEAST RICE PO) Take 2 tablets by mouth at bedtime.          Past Psychiatric History/Hospitalization(s): Anxiety: Yes Bipolar Disorder: Yes Depression: No Mania: No Psychosis: No Schizophrenia: No Personality Disorder: No Hospitalization for psychiatric  illness: Yes History of Electroconvulsive Shock Therapy: No Prior Suicide Attempts: No  Physical Exam: Constitutional:  BP 136/83  Pulse 58  Wt 153 lb (69.4 kg)  General Appearance: Patient is pleasant but confused.  He is fairly groomed.  Musculoskeletal: Strength & Muscle Tone: decreased and atrophy Gait & Station: unsteady Patient leans: Front  Psychiatric: Speech (describe rate, volume, coherence, spontaneity, and abnormalities if any): Slow but decreased volume and tone.  His speech is non-spontaneous.  Thought Process (describe rate, content, abstract reasoning, and computation): Circumstantial and confused.  Associations: Irrelevant and Circumstantial  Thoughts: Difficulty to organize his thoughts do to confusion and memory impairment.  Mental Status: Orientation: oriented to person and place Mood & Affect: anxiety Attention Span & Concentration: Poor  Medical Decision Making (Choose Three): Review of Psycho-Social Stressors (1), Review or order clinical lab tests (1), Decision to obtain old records (1), New Problem, with no additional work-up planned (3), Review of Last Therapy Session (1), Review or order medicine tests (1), Review of Medication Regimen & Side Effects (2) and Review of New Medication or Change in Dosage (2)  Assessment: Axis I: Bipolar disorder, cognitive disorder NOS  Axis II: Deferred  Axis III: See medical history  Axis IV: Moderate  Axis V: 50-55   Plan: I reviewed patient's discharge summary and recent lab.  His creatinine is back to normal.  His medicine has been changed.  He is not taking lithium, [place], Ativan.  He was started on Seroquel and he is taking Lamictal.  He is taking Zoloft.  He recently saw his primary care physician who started him on melatonin however patient has not started yet.  He like to try melatonin which is non-addictive.  Most of the information was obtained from his home health aide.  I recommend to see him in 4  weeks as patient does not want to change his medication at this time.  We will also evaluate if Seroquel and melatonin is helping his insomnia.  I explain in detail the risk and benefits of medication and recommended to call us if he has any question or concern.  I also provided list of the medication so home health aid and verify the medication which he is getting from assisted-living facility.  I recommend to bring current list of medication on his next appointment.  I will see him again in 4 weeks.  More than 50% of the time spent in psychoeducation, reviewing charts and coordination of care.  No new medication added on this visit and no new medication refills is given on this visit.  Buddy Loeffelholz T., MD 10/14/2012

## 2012-11-11 ENCOUNTER — Encounter (HOSPITAL_COMMUNITY): Payer: Self-pay | Admitting: Psychiatry

## 2012-11-11 ENCOUNTER — Ambulatory Visit (INDEPENDENT_AMBULATORY_CARE_PROVIDER_SITE_OTHER): Payer: Medicare Other | Admitting: Psychiatry

## 2012-11-11 VITALS — BP 146/78 | HR 65 | Wt 157.2 lb

## 2012-11-11 DIAGNOSIS — F319 Bipolar disorder, unspecified: Secondary | ICD-10-CM

## 2012-11-11 DIAGNOSIS — F09 Unspecified mental disorder due to known physiological condition: Secondary | ICD-10-CM

## 2012-11-11 DIAGNOSIS — F3181 Bipolar II disorder: Secondary | ICD-10-CM

## 2012-11-11 NOTE — Patient Instructions (Signed)
Must bring current medication list and recent blood work results. Follow up in 6 weeks

## 2012-11-11 NOTE — Progress Notes (Signed)
Patient ID: [name], male   DOB: 03/13/1935, 77 y.o.   MRN: [id number]  [facility] [address] Progress Note  [name] [id number] 77 y.o.  11/11/2012 3:00 PM  Chief Complaint: I still cannot sleep very well at bedtime.      History of Present Illness: Patient is 77 year old Caucasian divorced male who came for his appointment with his aide.  Patient did not bring list of medication which was advised on his last trip.  His aide told that [facility] did not provide list of the medication.  It is unclear what medicine he is taking since medication has been changed one more time.  As per patient he is taking Seroquel and the dose has been increased.  He is also taking medication for his insomnia.  He is taking medication for his UTI.  Overall patient feels better in his depression but he continues to have insomnia irritability and anger.  He has poor recollection .  He does not remember very well in detail.  He is confused about his medication.  He see [name] at [facility].  Remember trying melatonin but it was not working and as per aide his melatonin has been increased.  Patient denies any recent episode of UTI.  He's not drinking or using any illegal substance.  He has issues with the staff and food of assisted-living facility.  However he denies any aggression or severe mood swings.  Suicidal Ideation: No Plan Formed: No Patient has means to carry out plan: No  Homicidal Ideation: No Plan Formed: No Patient has means to carry out plan: No  Review of Systems  HENT: Positive for hearing loss.   Cardiovascular: Negative for chest pain and palpitations.  Musculoskeletal: Negative for back pain, joint pain and falls.  Neurological: Positive for dizziness, tremors, weakness and headaches. Negative for seizures and loss of consciousness.  Psychiatric/Behavioral: Positive for depression and memory loss. Negative for suicidal ideas, hallucinations and substance abuse.  The patient is nervous/anxious and has insomnia.    Past psychiatric history Patient has significant history of bipolar disorder.  He has multiple psychiatric admission due to decompensation and noncompliance of medication.  Patient has history of aggression and impulsive behavior.  His last psychiatric admission was in 2005.  Medical history Patient has history of diabetes mellitus, hypertension, Parkinson disease, gait instability, hitting impairment and cognitive impairment.  He was recently admitted at least twice for repeated UTI.    Family and Social History: Patient lives by herself.  He's been divorced for few years.  He has no children.  He lives in an assisted-living facility and he has a home health aid [phone].  He use to go [facility] but lately he has been not going to these groups due to physical illness.  Outpatient Encounter Prescriptions as of 11/11/2012  Medication Sig Dispense Refill  . lamoTRIgine (LAMICTAL) 150 MG tablet Take 150 mg by mouth 2 (two) times daily.      . QUEtiapine (SEROQUEL) 25 MG tablet Take 25 mg by mouth at bedtime.      . sertraline (ZOLOFT) 100 MG tablet Take 150 mg by mouth daily.       Marland Kitchen acetaminophen (TYLENOL) 500 MG tablet Take 500 mg by mouth every 4 (four) hours as needed. For pain      . amLODipine (NORVASC) 10 MG tablet Take 10 mg by mouth daily.      Marland Kitchen aspirin 81 MG tablet Take 81 mg by mouth  daily.        . carbidopa-levodopa (SINEMET) 25-250 MG per tablet Take 1 tablet by mouth 3 (three) times daily.       . ciprofloxacin (CIPRO) 500 MG tablet Take 500 mg by mouth 2 (two) times daily.      . entacapone (COMTAN) 200 MG tablet Take 200 mg by mouth 3 (three) times daily.       . Multiple Vitamin (MULTIVITAMIN WITH MINERALS) TABS Take 1 tablet by mouth daily.      Marland Kitchen omeprazole (PRILOSEC) 20 MG capsule Take 20 mg by mouth every morning.       . Red Yeast Rice Extract (RED YEAST RICE PO) Take 2 tablets by mouth at bedtime.          Past  Psychiatric History/Hospitalization(s): Anxiety: Yes Bipolar Disorder: Yes Depression: No Mania: No Psychosis: No Schizophrenia: No Personality Disorder: No Hospitalization for psychiatric illness: Yes History of Electroconvulsive Shock Therapy: No Prior Suicide Attempts: No  Physical Exam: Constitutional:  BP 146/78  Pulse 65  Wt 157 lb 3.2 oz (71.305 kg)  General Appearance: Patient is pleasant but confused.  He is fairly groomed.  Musculoskeletal: Strength & Muscle Tone: decreased and atrophy Gait & Station: unsteady Patient leans: Front  Mental status examination Patient is casually dressed and fairly groomed.  He is pleasant and cooperative but he has difficulty remembering things.  He is confused at times.  He continued to require reassurance from her aide to answer question.  His his speech is soft and his thought processes tangential.  He denies any active or passive suicidal thoughts or homicidal thoughts.  He denies any auditory or visual hallucination.  He has difficulty remembering things.  His attention and concentration is poor.  He described his mood is anxious and his affect is mood appropriate.  His immediate recall is good there were no delusion obsession present at this time.  He has fine tremors and has difficulty walking.  He need walker .  His gait is unsteady.  He's alert and oriented x3.  His insight judgment and impulse control is fair.  Medical Decision Making (Choose Three): Review of Psycho-Social Stressors (1), Decision to obtain old records (1), Established Problem, Worsening (2), Review of Last Therapy Session (1), Review or order medicine tests (1), Review of Medication Regimen & Side Effects (2) and Review of New Medication or Change in Dosage (2)  Assessment: Axis I: Bipolar disorder, cognitive disorder NOS  Axis II: Deferred  Axis III: See medical history  Axis IV: Moderate  Axis V: 50-55   Plan: I called assisting living facility.  I  requested that his current medication and blood work should be faxed to for his next appointment.  It is unclear if the circle dose has been increased.  As per patient he was started on the medication for his sleep which is helping however patient do not know the name of the medication.  We'll contact assisted-living facility for further information.  At this time no new medication is added .  No new medication prescription given on this visit.  His medication has been managed by his doctor at assisted-living facility.  I discussed in detail the risk and benefits of medication.  I recommend to call us if he is any question or concern if he feel worsening of the symptom.  I will see him again in 6 weeks.  I recommend that he must bring list of the medication with him on his next appointment.  Time spent 30 minutes.  Montavious Wierzba T., MD 11/11/2012

## 2012-12-05 DIAGNOSIS — G20 Parkinson's disease: Secondary | ICD-10-CM | POA: Insufficient documentation

## 2012-12-05 DIAGNOSIS — R269 Unspecified abnormalities of gait and mobility: Secondary | ICD-10-CM | POA: Insufficient documentation

## 2012-12-10 ENCOUNTER — Emergency Department (HOSPITAL_COMMUNITY): Payer: Medicare Other

## 2012-12-10 ENCOUNTER — Encounter (HOSPITAL_COMMUNITY): Payer: Self-pay | Admitting: Emergency Medicine

## 2012-12-10 ENCOUNTER — Inpatient Hospital Stay (HOSPITAL_COMMUNITY)
Admission: EM | Admit: 2012-12-10 | Discharge: 2012-12-24 | DRG: 163 | Disposition: A | Discharge status: Rehabilitation Facility | Payer: Medicare Other | Attending: Thoracic Surgery (Cardiothoracic Vascular Surgery) | Admitting: Thoracic Surgery (Cardiothoracic Vascular Surgery)

## 2012-12-10 DIAGNOSIS — Z978 Presence of other specified devices: Secondary | ICD-10-CM

## 2012-12-10 DIAGNOSIS — B957 Other staphylococcus as the cause of diseases classified elsewhere: Secondary | ICD-10-CM | POA: Diagnosis present

## 2012-12-10 DIAGNOSIS — J189 Pneumonia, unspecified organism: Principal | ICD-10-CM

## 2012-12-10 DIAGNOSIS — Z9889 Other specified postprocedural states: Secondary | ICD-10-CM

## 2012-12-10 DIAGNOSIS — D72829 Elevated white blood cell count, unspecified: Secondary | ICD-10-CM | POA: Diagnosis present

## 2012-12-10 DIAGNOSIS — J869 Pyothorax without fistula: Secondary | ICD-10-CM

## 2012-12-10 DIAGNOSIS — F319 Bipolar disorder, unspecified: Secondary | ICD-10-CM

## 2012-12-10 DIAGNOSIS — D649 Anemia, unspecified: Secondary | ICD-10-CM | POA: Diagnosis present

## 2012-12-10 DIAGNOSIS — I10 Essential (primary) hypertension: Secondary | ICD-10-CM | POA: Diagnosis present

## 2012-12-10 DIAGNOSIS — E870 Hyperosmolality and hypernatremia: Secondary | ICD-10-CM | POA: Diagnosis present

## 2012-12-10 DIAGNOSIS — J90 Pleural effusion, not elsewhere classified: Secondary | ICD-10-CM

## 2012-12-10 DIAGNOSIS — M129 Arthropathy, unspecified: Secondary | ICD-10-CM | POA: Diagnosis present

## 2012-12-10 DIAGNOSIS — G9340 Encephalopathy, unspecified: Secondary | ICD-10-CM | POA: Diagnosis present

## 2012-12-10 DIAGNOSIS — G20 Parkinson's disease: Secondary | ICD-10-CM | POA: Diagnosis present

## 2012-12-10 DIAGNOSIS — N179 Acute kidney failure, unspecified: Secondary | ICD-10-CM

## 2012-12-10 DIAGNOSIS — N390 Urinary tract infection, site not specified: Secondary | ICD-10-CM

## 2012-12-10 DIAGNOSIS — G20A1 Parkinson's disease without dyskinesia, without mention of fluctuations: Secondary | ICD-10-CM | POA: Diagnosis present

## 2012-12-10 DIAGNOSIS — E119 Type 2 diabetes mellitus without complications: Secondary | ICD-10-CM

## 2012-12-10 DIAGNOSIS — F411 Generalized anxiety disorder: Secondary | ICD-10-CM | POA: Diagnosis present

## 2012-12-10 DIAGNOSIS — Z96649 Presence of unspecified artificial hip joint: Secondary | ICD-10-CM

## 2012-12-10 DIAGNOSIS — I69920 Aphasia following unspecified cerebrovascular disease: Secondary | ICD-10-CM

## 2012-12-10 DIAGNOSIS — F419 Anxiety disorder, unspecified: Secondary | ICD-10-CM

## 2012-12-10 LAB — CBC WITH DIFFERENTIAL/PLATELET
Basophils Absolute: 0 10*3/uL (ref 0.0–0.1)
Lymphocytes Relative: 3 % — ABNORMAL LOW (ref 12–46)
Lymphs Abs: 0.6 10*3/uL — ABNORMAL LOW (ref 0.7–4.0)
MCV: 84.8 fL (ref 78.0–100.0)
Neutro Abs: 20 10*3/uL — ABNORMAL HIGH (ref 1.7–7.7)
Neutrophils Relative %: 92 % — ABNORMAL HIGH (ref 43–77)
Platelets: 386 10*3/uL (ref 150–400)
RBC: 4.55 MIL/uL (ref 4.22–5.81)
RDW: 14.5 % (ref 11.5–15.5)
WBC: 21.7 10*3/uL — ABNORMAL HIGH (ref 4.0–10.5)

## 2012-12-10 LAB — BASIC METABOLIC PANEL
CO2: 25 mEq/L (ref 19–32)
Calcium: 9.5 mg/dL (ref 8.4–10.5)
Glucose, Bld: 170 mg/dL — ABNORMAL HIGH (ref 70–99)
Potassium: 4.4 mEq/L (ref 3.5–5.1)
Sodium: 134 mEq/L — ABNORMAL LOW (ref 135–145)

## 2012-12-10 LAB — URINALYSIS, ROUTINE W REFLEX MICROSCOPIC
Glucose, UA: NEGATIVE mg/dL
Protein, ur: 100 mg/dL — AB
Specific Gravity, Urine: 1.033 — ABNORMAL HIGH (ref 1.005–1.030)
Urobilinogen, UA: 0.2 mg/dL (ref 0.0–1.0)

## 2012-12-10 LAB — URINE MICROSCOPIC-ADD ON

## 2012-12-10 MED ORDER — IOHEXOL 300 MG/ML  SOLN
80.0000 mL | Freq: Once | INTRAMUSCULAR | Status: AC | PRN
  Administered 2012-12-10: 80 mL via INTRAVENOUS

## 2012-12-10 MED ORDER — CLONAZEPAM 0.5 MG PO TABS
0.5000 mg | ORAL_TABLET | Freq: Two times a day (BID) | ORAL | Status: DC | PRN
  Administered 2012-12-11 – 2012-12-24 (×14): 0.5 mg via ORAL
  Filled 2012-12-10 (×14): qty 1

## 2012-12-10 MED ORDER — VANCOMYCIN HCL IN DEXTROSE 1-5 GM/200ML-% IV SOLN
1000.0000 mg | Freq: Two times a day (BID) | INTRAVENOUS | Status: DC
  Administered 2012-12-11 – 2012-12-22 (×24): 1000 mg via INTRAVENOUS
  Filled 2012-12-10 (×26): qty 200

## 2012-12-10 MED ORDER — PANTOPRAZOLE SODIUM 40 MG PO TBEC
40.0000 mg | DELAYED_RELEASE_TABLET | Freq: Every day | ORAL | Status: DC
  Administered 2012-12-10 – 2012-12-16 (×7): 40 mg via ORAL
  Filled 2012-12-10: qty 2
  Filled 2012-12-10 (×6): qty 1

## 2012-12-10 MED ORDER — CARBIDOPA-LEVODOPA 25-250 MG PO TABS
1.0000 | ORAL_TABLET | Freq: Three times a day (TID) | ORAL | Status: DC
  Administered 2012-12-10 – 2012-12-24 (×40): 1 via ORAL
  Filled 2012-12-10 (×46): qty 1

## 2012-12-10 MED ORDER — DOCUSATE SODIUM 100 MG PO CAPS
100.0000 mg | ORAL_CAPSULE | Freq: Two times a day (BID) | ORAL | Status: DC | PRN
  Filled 2012-12-10: qty 1

## 2012-12-10 MED ORDER — SODIUM CHLORIDE 0.9 % IV SOLN
INTRAVENOUS | Status: DC
  Administered 2012-12-10 – 2012-12-15 (×4): via INTRAVENOUS

## 2012-12-10 MED ORDER — ONDANSETRON HCL 4 MG PO TABS: 4.0000 mg | ORAL_TABLET | Freq: Four times a day (QID) | ORAL | Status: DC | PRN

## 2012-12-10 MED ORDER — GUAIFENESIN ER 600 MG PO TB12
1200.0000 mg | ORAL_TABLET | Freq: Two times a day (BID) | ORAL | Status: DC | PRN
  Filled 2012-12-10: qty 2

## 2012-12-10 MED ORDER — ENOXAPARIN SODIUM 40 MG/0.4ML ~~LOC~~ SOLN
40.0000 mg | SUBCUTANEOUS | Status: DC
  Administered 2012-12-10 – 2012-12-11 (×2): 40 mg via SUBCUTANEOUS
  Filled 2012-12-10 (×3): qty 0.4

## 2012-12-10 MED ORDER — BACITRACIN-NEOMYCIN-POLYMYXIN OINTMENT TUBE
1.0000 "application " | TOPICAL_OINTMENT | Freq: Every day | CUTANEOUS | Status: DC
  Administered 2012-12-10 – 2012-12-15 (×5): 1 via TOPICAL
  Filled 2012-12-10: qty 15

## 2012-12-10 MED ORDER — ASPIRIN EC 81 MG PO TBEC
81.0000 mg | DELAYED_RELEASE_TABLET | Freq: Every morning | ORAL | Status: DC
  Administered 2012-12-11 – 2012-12-24 (×13): 81 mg via ORAL
  Filled 2012-12-10 (×14): qty 1

## 2012-12-10 MED ORDER — TRAZODONE HCL 50 MG PO TABS
50.0000 mg | ORAL_TABLET | Freq: Every evening | ORAL | Status: DC | PRN
  Filled 2012-12-10: qty 1

## 2012-12-10 MED ORDER — CRANBERRY 450 MG PO TABS: 1.0000 | ORAL_TABLET | Freq: Every morning | ORAL | Status: DC

## 2012-12-10 MED ORDER — VALERIAN ROOT 500 MG PO CAPS: 1.0000 | ORAL_CAPSULE | Freq: Every day | ORAL | Status: DC

## 2012-12-10 MED ORDER — ENTACAPONE 200 MG PO TABS
200.0000 mg | ORAL_TABLET | Freq: Three times a day (TID) | ORAL | Status: DC
  Administered 2012-12-10 – 2012-12-24 (×40): 200 mg via ORAL
  Filled 2012-12-10 (×46): qty 1

## 2012-12-10 MED ORDER — DEXTROSE 5 % IV SOLN
1.0000 g | INTRAVENOUS | Status: DC
  Administered 2012-12-10: 1 g via INTRAVENOUS
  Filled 2012-12-10: qty 10

## 2012-12-10 MED ORDER — AZITHROMYCIN 250 MG PO TABS
500.0000 mg | ORAL_TABLET | Freq: Once | ORAL | Status: AC
  Administered 2012-12-10: 500 mg via ORAL
  Filled 2012-12-10: qty 2

## 2012-12-10 MED ORDER — ADULT MULTIVITAMIN W/MINERALS CH
1.0000 | ORAL_TABLET | Freq: Every morning | ORAL | Status: DC
  Administered 2012-12-11 – 2012-12-15 (×5): 1 via ORAL
  Filled 2012-12-10 (×6): qty 1

## 2012-12-10 MED ORDER — SODIUM CHLORIDE 0.9 % IV SOLN
INTRAVENOUS | Status: DC
  Administered 2012-12-10: 18:00:00 via INTRAVENOUS

## 2012-12-10 MED ORDER — QUETIAPINE FUMARATE 50 MG PO TABS
50.0000 mg | ORAL_TABLET | Freq: Every day | ORAL | Status: DC
  Administered 2012-12-10 – 2012-12-23 (×14): 50 mg via ORAL
  Filled 2012-12-10 (×17): qty 1

## 2012-12-10 MED ORDER — LAMOTRIGINE 150 MG PO TABS
150.0000 mg | ORAL_TABLET | Freq: Two times a day (BID) | ORAL | Status: DC
  Administered 2012-12-10 – 2012-12-24 (×28): 150 mg via ORAL
  Filled 2012-12-10 (×31): qty 1

## 2012-12-10 MED ORDER — ONDANSETRON HCL 4 MG/2ML IJ SOLN: 4.0000 mg | Freq: Four times a day (QID) | INTRAMUSCULAR | Status: DC | PRN

## 2012-12-10 MED ORDER — SERTRALINE HCL 50 MG PO TABS
150.0000 mg | ORAL_TABLET | Freq: Every morning | ORAL | Status: DC
  Administered 2012-12-11 – 2012-12-24 (×14): 150 mg via ORAL
  Filled 2012-12-10 (×14): qty 1

## 2012-12-10 MED ORDER — PIPERACILLIN-TAZOBACTAM 3.375 G IVPB 30 MIN
3.3750 g | Freq: Three times a day (TID) | INTRAVENOUS | Status: DC
  Administered 2012-12-10 – 2012-12-23 (×37): 3.375 g via INTRAVENOUS
  Filled 2012-12-10 (×42): qty 50

## 2012-12-10 NOTE — ED Notes (Signed)
Per EMS: Pt coming from [facility].  Pt slid out of bed and scraped his right side.  C/o pain there.  Denies LOC.

## 2012-12-10 NOTE — ED Notes (Signed)
Pt has moments of confusion. Able to identify name, place, and time, but repeatedly asks the same questions.

## 2012-12-10 NOTE — ED Notes (Signed)
Bed:WHALA<BR> Expected date:<BR> Expected time:<BR> Means of arrival:<BR> Comments:<BR> [name] slipped off bed, scraped rib area

## 2012-12-10 NOTE — Progress Notes (Signed)
ANTIBIOTIC CONSULT NOTE - INITIAL  Pharmacy Consult for Vancomycin Indication: HCAP and UTI  Allergies  Allergen Reactions  . Cholestatin Other (See Comments)    Per MAR    Patient Measurements:   71kg  Vital Signs: Temp: 100.1 F (37.8 C) (12/24 2129) Temp src: Oral (12/24 2129) BP: 166/85 mmHg (12/24 2129) Pulse Rate: 82  (12/24 2129) Intake/Output from previous day:   Intake/Output from this shift:    Labs:  Basename 12/10/12 1640  WBC 21.7*  HGB 13.2  PLT 386  LABCREA --  CREATININE 1.00   The CrCl is unknown because both a height and weight (above a minimum accepted value) are required for this calculation. No results found for this basename: VANCOTROUGH:2,VANCOPEAK:2,VANCORANDOM:2,GENTTROUGH:2,GENTPEAK:2,GENTRANDOM:2,TOBRATROUGH:2,TOBRAPEAK:2,TOBRARND:2,AMIKACINPEAK:2,AMIKACINTROU:2,AMIKACIN:2, in the last 72 hours   Microbiology: No results found for this or any previous visit (from the past 720 hour(s)).  Medical History: Past Medical History  Diagnosis Date  . Hearing aid worn   . Hypertension   . Parkinson disease   . Pneumonia   . Diabetes mellitus type II   . Arthritis   . Bipolar disorder   . Anxiety   . Depression   . MRSA infection (methicillin-resistant Staphylococcus aureus)   . Chronic indwelling foley catheter   . C2 cervical fracture     due to pt fall  . SIRS (systemic inflammatory response syndrome)   . Neuromuscular disorder     Medications:  Anti-infectives     Start     Dose/Rate Route Frequency Ordered Stop   12/10/12 2200  piperacillin-tazobactam (ZOSYN) IVPB 3.375 g       3.375 g 100 mL/hr over 30 Minutes Intravenous 3 times per day 12/10/12 2139     12/10/12 1945   cefTRIAXone (ROCEPHIN) 1 g in dextrose 5 % 50 mL IVPB  Status:  Discontinued        1 g 100 mL/hr over 30 Minutes Intravenous Every 24 hours 12/10/12 1937 12/10/12 2139   12/10/12 1945   azithromycin (ZITHROMAX) tablet 500 mg        500 mg Oral  Once  12/10/12 1937 12/10/12 2019         Assessment: 77 YOM w/ HCAP and UTI. Pharmacy asked to dose Vanc. Pt is also on Zosyn 71kg, Scr 1, CrCl ~ 65 ml/min/1.66m2, Tm 100.1, WBC 21.7, cx pending  Goal of Therapy:  Vancomycin trough level 15-20 mcg/ml  Plan:   Vancomycin 1g IV q12h  Follow labs, vitals and cultures  VT @ [facility] if necessary  Adjust dose as appropriate  Gwen Her PharmD  [phone] 12/10/2012 9:47 PM

## 2012-12-10 NOTE — ED Notes (Signed)
Hospitalist left bedside. 

## 2012-12-10 NOTE — H&P (Signed)
[facility] History and Physical  [name] Carpenter:[id number] DOB: 1935/06/18    PCP:   [name], MD   Chief Complaint: right sided chestpain and coughs.  HPI:77 y.o. male presents to the ER as he slid off his bed and was having right sided CP.  He has Hx of parkinson and had a recent fall with C2 Fx. He also has bipolar disorder, HTN, chronic indwelling catheter and frequent UTIs. The most recent UTI was with pseudomonas sensitive to Cipro, Cefepime, Zosyn, etc... He admitted to having yellow sputum productive coughs, but no fever or shortness of breath.  Work up in the ER included a CXR included a right pleural base consolidation.  A f/up CT showed right upper lobe consolidation with moderate pleural effusion (?parapneumonic effusion), can't exclude underlying malignancy.  He has a leukocytosis with WBC of 21K, normal renal fx tests, and a UA showed pyuria.  Hospitalist was asked to admit him for HCAP and ?UTI.    Rewiew of Systems:  Constitutional: Negative for malaise, fever and chills. No significant weight loss or weight gain Eyes: Negative for eye pain, redness and discharge, diplopia, visual changes, or flashes of light. ENMT: Negative for ear pain, hoarseness, nasal congestion, sinus pressure and sore throat. No headaches; tinnitus, drooling, or problem swallowing. Cardiovascular: Negative for chest pain, palpitations, diaphoresis, dyspnea and peripheral edema. ; No orthopnea, PND Respiratory: Negative for hemoptysis, wheezing and stridor. No pleuritic chestpain. Gastrointestinal: Negative for nausea, vomiting, diarrhea, constipation, abdominal pain, melena, blood in stool, hematemesis, jaundice and rectal bleeding.    Genitourinary: Negative for frequency, dysuria, incontinence,flank pain and hematuria; Musculoskeletal: Negative for back pain and neck pain. Negative for swelling and trauma.;  Skin: . Negative for pruritus, rash, abrasions, bruising and skin lesion.;  ulcerations Neuro: Negative for headache, lightheadedness and neck stiffness. Negative for weakness, altered level of consciousness , altered mental status, extremity weakness, burning feet, involuntary movement, seizure and syncope.  Psych: negative for anxiety, depression, insomnia, tearfulness, panic attacks, hallucinations, paranoia, suicidal or homicidal ideation    Past Medical History  Diagnosis Date  . Hearing aid worn   . Hypertension   . Parkinson disease   . Pneumonia   . Diabetes mellitus type II   . Arthritis   . Bipolar disorder   . Anxiety   . Depression   . MRSA infection (methicillin-resistant Staphylococcus aureus)   . Chronic indwelling foley catheter   . C2 cervical fracture     due to pt fall  . SIRS (systemic inflammatory response syndrome)   . Neuromuscular disorder     Past Surgical History  Procedure Date  . Total hip arthroplasty     Medications:  HOME MEDS: Prior to Admission medications   Medication Sig Start Date End Date Taking? Authorizing Provider  aspirin 81 MG tablet Take 81 mg by mouth every morning.    Yes Historical Provider, MD  carbidopa-levodopa (SINEMET) 25-250 MG per tablet Take 1 tablet by mouth 3 (three) times daily.    Yes Historical Provider, MD  clonazePAM (KLONOPIN) 0.5 MG tablet Take 0.5 mg by mouth 2 (two) times daily as needed. anxiety   Yes Historical Provider, MD  Cranberry (AZO-CRANBERRY) 450 MG TABS Take 1 tablet by mouth every morning.   Yes Historical Provider, MD  docusate sodium (COLACE) 50 MG capsule Take by mouth 2 (two) times daily.   Yes Historical Provider, MD  entacapone (COMTAN) 200 MG tablet Take 200 mg by mouth 3 (three) times daily.  12/15/11  Yes Historical Provider, MD  guaiFENesin (MUCINEX) 600 MG 12 hr tablet Take 1,200 mg by mouth 2 (two) times daily as needed. cough   Yes Historical Provider, MD  lamoTRIgine (LAMICTAL) 150 MG tablet Take 150 mg by mouth 2 (two) times daily.   Yes Historical Provider,  MD  Multiple Vitamin (MULTIVITAMIN WITH MINERALS) TABS Take 1 tablet by mouth every morning.    Yes Historical Provider, MD  naproxen (NAPROSYN) 375 MG tablet Take 375 mg by mouth daily with breakfast.   Yes Historical Provider, MD  neomycin-bacitracin-polymyxin (NEOSPORIN) [phone] ointment Apply 1 application topically daily. Applies to buttock for pressure   Yes Historical Provider, MD  omeprazole (PRILOSEC) 20 MG capsule Take 20 mg by mouth every morning.   Yes Historical Provider, MD  QUEtiapine (SEROQUEL) 50 MG tablet Take 50 mg by mouth at bedtime.   Yes Historical Provider, MD  Red Yeast Rice Extract (RED YEAST RICE PO) Take 2 tablets by mouth at bedtime.     Yes Historical Provider, MD  sertraline (ZOLOFT) 50 MG tablet Take 150 mg by mouth every morning.   Yes Historical Provider, MD  traZODone (DESYREL) 50 MG tablet Take 50 mg by mouth at bedtime as needed. sleep   Yes Historical Provider, MD  Valerian Root 500 MG CAPS Take 1 capsule by mouth at bedtime.   Yes Historical Provider, MD     Allergies:  Allergies  Allergen Reactions  . Cholestatin Other (See Comments)    Per MAR    Social History:   reports that he has never smoked. He has never used smokeless tobacco. He reports that he drinks alcohol. He reports that he does not use illicit drugs.  Family History: Family History  Problem Relation Age of Onset  . Depression Father      Physical Exam: Filed Vitals:   12/10/12 1548  BP: 131/73  Pulse: 71  Temp: 98.8 F (37.1 C)  TempSrc: Oral  Resp: 16  SpO2: 92%   Blood pressure 131/73, pulse 71, temperature 98.8 F (37.1 C), temperature source Oral, resp. rate 16, SpO2 92.00%.  GEN:  Pleasant  patient lying in the stretcher in no acute distress; cooperative with exam. PSYCH:  confused; does not appear anxious or depressed; affect is appropriate. HEENT: Mucous membranes pink and anicteric; PERRLA; EOM intact; no cervical lymphadenopathy nor thyromegaly or carotid  bruit; no JVD; There were no stridor. Neck is very supple. Breasts:: Not examined CHEST WALL: No tenderness CHEST: diffuse rhonchi, no wheezing, no rales. HEART: Regular rate and rhythm.  There are no murmur, rub, or gallops.   BACK: No kyphosis or scoliosis; no CVA tenderness ABDOMEN: soft and non-tender; no masses, no organomegaly, normal abdominal bowel sounds; no pannus; no intertriginous candida. There is no rebound and no distention. Rectal Exam: Not done EXTREMITIES: No bone or joint deformity; age-appropriate arthropathy of the hands and knees; no edema; no ulcerations.  There is no calf tenderness. Genitalia: not examined PULSES: 2+ and symmetric SKIN: Normal hydration no rash or ulceration CNS: Cranial nerves 2-12 grossly intact no focal lateralizing neurologic deficit.  Speech is fluent; uvula elevated with phonation, facial symmetry and tongue midline. DTR are normal bilaterally, cerebella exam is intact, barbinski is negative and strengths are equaled bilaterally.  No sensory loss.   Labs on Admission:  Basic Metabolic Panel:  Lab 12/10/12 1610  NA 134*  K 4.4  CL 99  CO2 25  GLUCOSE 170*  BUN 26*  CREATININE 1.00  CALCIUM 9.5  MG --  PHOS --   Liver Function Tests: No results found for this basename: AST:5,ALT:5,ALKPHOS:5,BILITOT:5,PROT:5,ALBUMIN:5 in the last 168 hours No results found for this basename: LIPASE:5,AMYLASE:5 in the last 168 hours No results found for this basename: AMMONIA:5 in the last 168 hours CBC:  Lab 12/10/12 1640  WBC 21.7*  NEUTROABS 20.0*  HGB 13.2  HCT 38.6*  MCV 84.8  PLT 386   Cardiac Enzymes: No results found for this basename: CKTOTAL:5,CKMB:5,CKMBINDEX:5,TROPONINI:5 in the last 168 hours  CBG: No results found for this basename: GLUCAP:5 in the last 168 hours   Radiological Exams on Admission: Dg Ribs Unilateral W/chest Right  12/10/2012  *RADIOLOGY REPORT*  Clinical Data: Fall.  Right-sided lower rib pain.  RIGHT RIBS  AND CHEST - 3+ VIEW  Comparison: 09/07/2012.  Findings: The previous chest radiographs were essentially normal. There is loculated right pleural fluid, that in the setting of a fall may represent hemothorax.  Consider follow-up noncontrast chest CT for further assessment.  Fluid tracks into the major and minor fissures.  The cardiopericardial silhouette is partially obscured.  Left basilar atelectasis is present.  Healed left-sided rib fractures. Bilateral severe glenohumeral osteoarthritis. No pneumothorax.  IMPRESSION: Moderate amount of loculated appearing right pleural opacity may represent hemothorax in the setting of trauma.  Consider follow-up chest CT without infusion to assess for hemothorax.  If the patient has infectious symptoms, chest CT with infusion is preferred. No displaced rib fractures are identified.   Original Report Authenticated By: Andreas Newport, M.D.    Ct Chest W Contrast  12/10/2012  *RADIOLOGY REPORT*  Clinical Data: Fall.  Abnormal chest radiograph.  Right-sided pleural fluid.  CT CHEST WITH CONTRAST  Technique:  Multidetector CT imaging of the chest was performed following the standard protocol during bolus administration of intravenous contrast.  Contrast: 80mL OMNIPAQUE IOHEXOL 300 MG/ML  SOLN  Comparison: Chest radiograph 12/10/2012.  Findings: Cardiomegaly is present. Coronary artery atherosclerosis is present. If office based assessment of coronary risk factors has not been performed, it is now recommended.  There is a moderate to large right pleural effusion.  Consolidation of the right upper lobe is present.  Small right paratracheal lymph nodes are present which are probably reactive.  No subcarinal or left hilar adenopathy.  There is no airspace disease in the left lung. Pleural fluid appears low attenuation without definite loculation.  There is collapse of the left lower lobe associated with the effusion.  There are no displaced rib fractures identified.  Severe  bilateral glenohumeral osteoarthritis is present. Sub-centimeter thyroid nodule(s) noted, too small to characterize, but most likely benign in the absence of known clinical risk factors for thyroid carcinoma.  Aortic atherosclerosis.  Aneurysmal dilation of the ascending aorta, measuring 44 mm.  No acute aortic abnormality.  Small amount of pericardial fluid or thickening.  Incidental imaging of the upper abdomen shows thickening of the left adrenal gland, probably representing hyperplasia.  Thoracolumbar spondylosis.  No aggressive osseous lesions.  IMPRESSION: 1.  Consolidation of the right upper lobe.  Collapse of the right lower lobe secondary to moderate to large right pleural effusion. Findings are most compatible with right upper lobe pneumonia with parapneumonic effusion.  Underlying neoplasm is not excluded. Follow-up to ensure radiographic clearing recommended.  Clearing is usually observed at 8 weeks. 2.  Cardiomegaly. 3.  Ascending aortic aneurysm measuring 44 mm.  No acute abnormalities.   Original Report Authenticated By: Andreas Newport, M.D.     Assessment/Plan Present on Admission:  .  HCAP (healthcare-associated pneumonia) . Parapneumonic effusion . Parkinson disease . Urinary tract infection . Diabetes mellitus, type 2 . Bipolar 1 disorder . Hypertension . Mental status, decreased   PLAN:  He likely has HCAP with a parapneumonic effusion.  I don't think he has aspiration.  He also had hx of pseudomonas UTI and still has pyuria (may not be an infection), but Tx his HCAP with Van/Zosyn should cover UTI as well if it is present.  For his DM, will use SSI as needed.  His medications have been continued and he is quite stable.  Will admit him to general medical floor under [facility] service.  He is a full code.  Other plans as per orders.  Code Status: FULL Unk Lightning, MD. [facility] Pager [phone] 7pm to 7am.  12/10/2012, 9:28 PM

## 2012-12-10 NOTE — Progress Notes (Addendum)
PHARMACIST - PHYSICIAN ORDER COMMUNICATION  CONCERNING: P&T Medication Policy on Herbal Medications  DESCRIPTION:  This patient's orders for: cranberry and valerian root have been noted.  This product(s) is classified as an "herbal" or natural product. Due to a lack of definitive safety studies or FDA approval, nonstandard manufacturing practices, plus the potential risk of unknown drug-drug interactions while on inpatient medications, the Pharmacy and Therapeutics Committee does not permit the use of "herbal" or natural products of this type within [facility].   ACTION TAKEN: The pharmacy department is unable to verify this order at this time and your patient has been informed of this safety policy. Please reevaluate patient's clinical condition at discharge and address if the herbal or natural product(s) should be resumed at that time.

## 2012-12-10 NOTE — ED Provider Notes (Signed)
History    CSN: [id number] Arrival date & time 12/10/12  1540 First MD Initiated Contact with Patient 12/10/12 1609    Chief Complaint  Patient presents with  . Fall   HPI  Patient presents to the emergency room after sliding out of bed this morning and scraping his right side. The patient history of confusion and poor memory per his caregiver he does not exactly recall the details of what happened.  His caregiver was not with him at the time this occurred. He reports he just slid out of the bed this morning and scraped his right side. The caregiver noted that he was having some pain in the rib area and he seemed to be wincing today. She was concerned and wanted to get him checked out. He has been a little bit fatigued today but otherwise seems to be acting like his normal self.  He has not had trouble with fever, coughing, shortness of breath, abdominal pain, vomiting or diarrhea. Past Medical History  Diagnosis Date  . Hearing aid worn   . Hypertension   . Parkinson disease   . Pneumonia   . Diabetes mellitus type II   . Arthritis   . Bipolar disorder   . Anxiety   . Depression   . MRSA infection (methicillin-resistant Staphylococcus aureus)   . Chronic indwelling foley catheter   . C2 cervical fracture     due to pt fall  . SIRS (systemic inflammatory response syndrome)   . Neuromuscular disorder     Past Surgical History  Procedure Date  . Total hip arthroplasty     Family History  Problem Relation Age of Onset  . Depression Father     History  Substance Use Topics  . Smoking status: Never Smoker   . Smokeless tobacco: Never Used  . Alcohol Use: Yes     Comment: occasional      Review of Systems  All other systems reviewed and are negative.    Allergies  Cholestatin  Home Medications   Current Outpatient Rx  Name  Route  Sig  Dispense  Refill  . ASPIRIN 81 MG PO TABS   Oral   Take 81 mg by mouth every morning.          Marland Kitchen CARBIDOPA-LEVODOPA  25-250 MG PO TABS   Oral   Take 1 tablet by mouth 3 (three) times daily.          Marland Kitchen CLONAZEPAM 0.5 MG PO TABS   Oral   Take 0.5 mg by mouth 2 (two) times daily as needed. anxiety         . CRANBERRY 450 MG PO TABS   Oral   Take 1 tablet by mouth every morning.         Marland Kitchen DOCUSATE SODIUM 50 MG PO CAPS   Oral   Take by mouth 2 (two) times daily.         Marland Kitchen ENTACAPONE 200 MG PO TABS   Oral   Take 200 mg by mouth 3 (three) times daily.          . GUAIFENESIN ER 600 MG PO TB12   Oral   Take 1,200 mg by mouth 2 (two) times daily as needed. cough         . LAMOTRIGINE 150 MG PO TABS   Oral   Take 150 mg by mouth 2 (two) times daily.         . ADULT MULTIVITAMIN W/MINERALS [facility]  Oral   Take 1 tablet by mouth every morning.          Marland Kitchen NAPROXEN 375 MG PO TABS   Oral   Take 375 mg by mouth daily with breakfast.         . TRIPLE ANTIBIOTIC [phone] EX OINT   Topical   Apply 1 application topically daily. Applies to buttock for pressure         . OMEPRAZOLE 20 MG PO CPDR   Oral   Take 20 mg by mouth every morning.         Marland Kitchen QUETIAPINE FUMARATE 50 MG PO TABS   Oral   Take 50 mg by mouth at bedtime.         . RED YEAST RICE PO   Oral   Take 2 tablets by mouth at bedtime.           . SERTRALINE HCL 50 MG PO TABS   Oral   Take 150 mg by mouth every morning.         . TRAZODONE HCL 50 MG PO TABS   Oral   Take 50 mg by mouth at bedtime as needed. sleep         . VALERIAN ROOT 500 MG PO CAPS   Oral   Take 1 capsule by mouth at bedtime.           BP 131/73  Pulse 71  Temp 98.8 F (37.1 C) (Oral)  Resp 16  SpO2 92%  Physical Exam  Nursing note and vitals reviewed. Constitutional: No distress.       Thin, frail  HENT:  Head: Normocephalic and atraumatic.  Right Ear: External ear normal.  Left Ear: External ear normal.  Eyes: Conjunctivae normal are normal. Right eye exhibits no discharge. Left eye exhibits no discharge. No  scleral icterus.  Neck: Neck supple. No tracheal deviation present.  Cardiovascular: Normal rate, regular rhythm and intact distal pulses.   Pulmonary/Chest: Effort normal and breath sounds normal. No stridor. No respiratory distress. He has no wheezes. He has no rales. He exhibits tenderness.       Tenderness palpation right lateral inferior ribs, no crepitus  Abdominal: Soft. Bowel sounds are normal. He exhibits no distension. There is no tenderness. There is no rebound and no guarding.  Genitourinary:       Foley catheter in place, amber urine noted in the leg  Musculoskeletal: He exhibits no edema and no tenderness.       Cervical back: Normal.       Thoracic back: Normal.       Lumbar back: Normal.       Full range of motion all 4 extremities without pain or discomfort  Neurological: He is alert. He has normal strength. No sensory deficit. Cranial nerve deficit:  no gross defecits noted. He exhibits normal muscle tone. He displays no seizure activity. Coordination normal.  Skin: Skin is warm and dry. No rash noted.  Psychiatric: He has a normal mood and affect.    ED Course  Procedures (including critical care time)  Labs Reviewed  CBC WITH DIFFERENTIAL - Abnormal; Notable for the following:    WBC 21.7 (*)     HCT 38.6 (*)     Neutrophils Relative 92 (*)     Neutro Abs 20.0 (*)     Lymphocytes Relative 3 (*)     Lymphs Abs 0.6 (*)     Monocytes Absolute 1.1 (*)  All other components within normal limits  BASIC METABOLIC PANEL - Abnormal; Notable for the following:    Sodium 134 (*)     Glucose, Bld 170 (*)     BUN 26 (*)     GFR calc non Af Amer 71 (*)     GFR calc Af Amer 83 (*)     All other components within normal limits  URINALYSIS, ROUTINE W REFLEX MICROSCOPIC - Abnormal; Notable for the following:    Color, Urine AMBER (*)  BIOCHEMICALS MAY BE AFFECTED BY COLOR   APPearance TURBID (*)     Specific Gravity, Urine 1.033 (*)     Hgb urine dipstick LARGE (*)      Ketones, ur TRACE (*)     Protein, ur 100 (*)     Nitrite POSITIVE (*)     Leukocytes, UA LARGE (*)     All other components within normal limits  URINE MICROSCOPIC-ADD ON - Abnormal; Notable for the following:    Bacteria, UA MANY (*)     All other components within normal limits  URINE CULTURE   Dg Ribs Unilateral W/chest Right  12/10/2012  *RADIOLOGY REPORT*  Clinical Data: Fall.  Right-sided lower rib pain.  RIGHT RIBS AND CHEST - 3+ VIEW  Comparison: 09/07/2012.  Findings: The previous chest radiographs were essentially normal. There is loculated right pleural fluid, that in the setting of a fall may represent hemothorax.  Consider follow-up noncontrast chest CT for further assessment.  Fluid tracks into the major and minor fissures.  The cardiopericardial silhouette is partially obscured.  Left basilar atelectasis is present.  Healed left-sided rib fractures. Bilateral severe glenohumeral osteoarthritis. No pneumothorax.  IMPRESSION: Moderate amount of loculated appearing right pleural opacity may represent hemothorax in the setting of trauma.  Consider follow-up chest CT without infusion to assess for hemothorax.  If the patient has infectious symptoms, chest CT with infusion is preferred. No displaced rib fractures are identified.   Original Report Authenticated By: Andreas Newport, M.D.    Ct Chest W Contrast  12/10/2012  *RADIOLOGY REPORT*  Clinical Data: Fall.  Abnormal chest radiograph.  Right-sided pleural fluid.  CT CHEST WITH CONTRAST  Technique:  Multidetector CT imaging of the chest was performed following the standard protocol during bolus administration of intravenous contrast.  Contrast: 80mL OMNIPAQUE IOHEXOL 300 MG/ML  SOLN  Comparison: Chest radiograph 12/10/2012.  Findings: Cardiomegaly is present. Coronary artery atherosclerosis is present. If office based assessment of coronary risk factors has not been performed, it is now recommended.  There is a moderate to large right  pleural effusion.  Consolidation of the right upper lobe is present.  Small right paratracheal lymph nodes are present which are probably reactive.  No subcarinal or left hilar adenopathy.  There is no airspace disease in the left lung. Pleural fluid appears low attenuation without definite loculation.  There is collapse of the left lower lobe associated with the effusion.  There are no displaced rib fractures identified.  Severe bilateral glenohumeral osteoarthritis is present. Sub-centimeter thyroid nodule(s) noted, too small to characterize, but most likely benign in the absence of known clinical risk factors for thyroid carcinoma.  Aortic atherosclerosis.  Aneurysmal dilation of the ascending aorta, measuring 44 mm.  No acute aortic abnormality.  Small amount of pericardial fluid or thickening.  Incidental imaging of the upper abdomen shows thickening of the left adrenal gland, probably representing hyperplasia.  Thoracolumbar spondylosis.  No aggressive osseous lesions.  IMPRESSION: 1.  Consolidation of  the right upper lobe.  Collapse of the right lower lobe secondary to moderate to large right pleural effusion. Findings are most compatible with right upper lobe pneumonia with parapneumonic effusion.  Underlying neoplasm is not excluded. Follow-up to ensure radiographic clearing recommended.  Clearing is usually observed at 8 weeks. 2.  Cardiomegaly. 3.  Ascending aortic aneurysm measuring 44 mm.  No acute abnormalities.   Original Report Authenticated By: Andreas Newport, M.D.      1. Healthcare-associated pneumonia   2. Pleural effusion       MDM  CT scan suggest PNA with para pneumonic effusion.  Will start iv abx and plan on admission.  Pt will likely require thoracentesis and possible further treatment.  Findings discussed with patient and caregiver.        Celene Kras, MD 12/10/12 2004

## 2012-12-11 LAB — COMPREHENSIVE METABOLIC PANEL
ALT: <5 U/L (ref 0–53)
AST: 6 U/L (ref 0–37)
Albumin: 2.2 g/dL — ABNORMAL LOW (ref 3.5–5.2)
Alkaline Phosphatase: 76 U/L (ref 39–117)
Calcium: 8.7 mg/dL (ref 8.4–10.5)
GFR calc Af Amer: 75 mL/min — ABNORMAL LOW (ref >90)
Glucose, Bld: 111 mg/dL — ABNORMAL HIGH (ref 70–99)
Potassium: 3.8 mEq/L (ref 3.5–5.1)
Sodium: 135 mEq/L (ref 135–145)
Total Protein: 5.2 g/dL — ABNORMAL LOW (ref 6.0–8.3)

## 2012-12-11 LAB — CBC WITH DIFFERENTIAL/PLATELET
Basophils Absolute: 0 10*3/uL (ref 0.0–0.1)
Basophils Relative: 0 % (ref 0–1)
Eosinophils Absolute: 0 10*3/uL (ref 0.0–0.7)
Eosinophils Relative: 0 % (ref 0–5)
Lymphs Abs: 0.6 10*3/uL — ABNORMAL LOW (ref 0.7–4.0)
MCH: 29.1 pg (ref 26.0–34.0)
MCV: 84.7 fL (ref 78.0–100.0)
Neutrophils Relative %: 91 % — ABNORMAL HIGH (ref 43–77)
Platelets: 347 10*3/uL (ref 150–400)
RBC: 4.13 MIL/uL — ABNORMAL LOW (ref 4.22–5.81)
RDW: 14.6 % (ref 11.5–15.5)

## 2012-12-11 NOTE — Progress Notes (Addendum)
[facility]  Progress Note  [name] ZOX:[id number] DOB: 1935/06/16   PCP: Dr. Arville Lime (house calls)   Brief HPI:77 y.o. male presents to the ER as he slid off his bed and was having right sided CP. He has Hx of parkinson and had a recent fall with C2 Fx. He also has bipolar disorder, HTN, chronic indwelling catheter and frequent UTIs. The most recent UTI was with pseudomonas sensitive to Cipro, Cefepime, Zosyn, etc... He admitted to having yellow sputum productive coughs, but no fever or shortness of breath. Work up in the ER included a CXR included a right pleural base consolidation. A f/up CT showed right upper lobe consolidation with moderate pleural effusion (?parapneumonic effusion), can't exclude underlying malignancy. He has a leukocytosis with WBC of 21K, normal renal fx tests, and a UA showed pyuria. Hospitalist was asked to admit him for HCAP and UTI.   Past Medical History  Diagnosis Date  . Hearing aid worn   . Hypertension   . Parkinson disease   . Pneumonia   . Diabetes mellitus type II   . Arthritis   . Bipolar disorder   . Anxiety   . Depression   . MRSA infection (methicillin-resistant Staphylococcus aureus)   . Chronic indwelling foley catheter   . C2 cervical fracture     due to pt fall  . SIRS (systemic inflammatory response syndrome)   . Neuromuscular disorder     Consultants: None  Procedures: None  Antibiotics:  Vanc and zosyn  Subjective:   No complaints. Had last BM yesterday. No fevers or chills.   Objective: Vital signs in last 24 hours: Filed Vitals:   12/10/12 2129 12/10/12 2132 12/10/12 2241 12/11/12 0526  BP: 166/85  104/63 120/67  Pulse: 82  62 70  Temp: 100.1 F (37.8 C)  99.1 F (37.3 C) 97.9 F (36.6 C)  TempSrc: Oral  Oral Oral  Resp:   20 20  Height:   5\' 7"  (1.702 m)   Weight:   151 lb 6.4 oz (68.675 kg)   SpO2:  93% 93% 92%   Weight change:   Intake/Output Summary (Last 24 hours) at 12/11/12 1003 Last data  filed at 12/11/12 0916  Gross per 24 hour  Intake    240 ml  Output    400 ml  Net   -160 ml   BP 120/67  Pulse 70  Temp 97.9 F (36.6 C) (Oral)  Resp 20  Ht 5\' 7"  (1.702 m)  Wt 151 lb 6.4 oz (68.675 kg)  BMI 23.71 kg/m2  SpO2 92%  General Appearance:    Alert, cooperative, no distress, appears stated age  Head:    Normocephalic, without obvious abnormality, atraumatic  Eyes:    PERRL, conjunctiva/corneas clear, EOM's intact, fundi    benign, both eyes       Ears:    Normal TM's and external ear canals, both ears, hard of hearing  Nose:   Nares normal, septum midline, mucosa normal, no drainage    or sinus tenderness  Throat:   Lips, mucosa, and tongue normal; teeth and gums normal  Neck:   Supple, symmetrical, trachea midline, no adenopathy;       thyroid:  No enlargement/tenderness/nodules; no carotid   bruit or JVD  Back:     Symmetric, no curvature, ROM normal, no CVA tenderness  Lungs:     Decreased breath sounds at lung bases right >>>left, respirations unlabored  Chest wall:    No tenderness  or deformity  Heart:    Regular rate and rhythm, S1 and S2 normal, no murmur, rub   or gallop  Abdomen:     Soft, non-tender, bowel sounds active all four quadrants,    no masses, no organomegaly  Genitalia:    Normal male without lesion, discharge or tenderness  Extremities:   Extremities normal, atraumatic, no cyanosis or edema  Pulses:   2+ and symmetric all extremities  Skin:   Skin color, texture, turgor normal, no rashes or lesions  Lymph nodes:   Cervical, supraclavicular, and axillary nodes normal  Neurologic:   CNII-XII intact. Normal strength, sensation and reflexes      throughout   Lab Results: Basic Metabolic Panel:  Lab 12/10/12 6578  NA 134*  K 4.4  CL 99  CO2 25  GLUCOSE 170*  BUN 26*  CREATININE 1.00  CALCIUM 9.5  MG --  PHOS --   CBC:  Lab 12/10/12 1640  WBC 21.7*  NEUTROABS 20.0*  HGB 13.2  HCT 38.6*  MCV 84.8  PLT 386      Urinalysis:  Lab 12/10/12 1830  COLORURINE AMBER*  LABSPEC 1.033*  PHURINE 6.0  GLUCOSEU NEGATIVE  HGBUR LARGE*  BILIRUBINUR NEGATIVE  KETONESUR TRACE*  PROTEINUR 100*  UROBILINOGEN 0.2  NITRITE POSITIVE*  LEUKOCYTESUR LARGE*     Micro Results: Recent Results (from the past 240 hour(s))  MRSA PCR SCREENING     Status: Normal   Collection Time   12/10/12 10:53 PM      Component Value Range Status Comment   MRSA by PCR NEGATIVE  NEGATIVE Final    Studies/Results: Dg Ribs Unilateral W/chest Right  12/10/2012  *RADIOLOGY REPORT*  Clinical Data: Fall.  Right-sided lower rib pain.  RIGHT RIBS AND CHEST - 3+ VIEW  Comparison: 09/07/2012.  Findings: The previous chest radiographs were essentially normal. There is loculated right pleural fluid, that in the setting of a fall may represent hemothorax.  Consider follow-up noncontrast chest CT for further assessment.  Fluid tracks into the major and minor fissures.  The cardiopericardial silhouette is partially obscured.  Left basilar atelectasis is present.  Healed left-sided rib fractures. Bilateral severe glenohumeral osteoarthritis. No pneumothorax.  IMPRESSION: Moderate amount of loculated appearing right pleural opacity may represent hemothorax in the setting of trauma.  Consider follow-up chest CT without infusion to assess for hemothorax.  If the patient has infectious symptoms, chest CT with infusion is preferred. No displaced rib fractures are identified.   Original Report Authenticated By: Andreas Newport, M.D.    Ct Chest W Contrast  12/10/2012  *RADIOLOGY REPORT*  Clinical Data: Fall.  Abnormal chest radiograph.  Right-sided pleural fluid.  CT CHEST WITH CONTRAST  Technique:  Multidetector CT imaging of the chest was performed following the standard protocol during bolus administration of intravenous contrast.  Contrast: 80mL OMNIPAQUE IOHEXOL 300 MG/ML  SOLN  Comparison: Chest radiograph 12/10/2012.  Findings: Cardiomegaly is  present. Coronary artery atherosclerosis is present. If office based assessment of coronary risk factors has not been performed, it is now recommended.  There is a moderate to large right pleural effusion.  Consolidation of the right upper lobe is present.  Small right paratracheal lymph nodes are present which are probably reactive.  No subcarinal or left hilar adenopathy.  There is no airspace disease in the left lung. Pleural fluid appears low attenuation without definite loculation.  There is collapse of the left lower lobe associated with the effusion.  There are no displaced rib  fractures identified.  Severe bilateral glenohumeral osteoarthritis is present. Sub-centimeter thyroid nodule(s) noted, too small to characterize, but most likely benign in the absence of known clinical risk factors for thyroid carcinoma.  Aortic atherosclerosis.  Aneurysmal dilation of the ascending aorta, measuring 44 mm.  No acute aortic abnormality.  Small amount of pericardial fluid or thickening.  Incidental imaging of the upper abdomen shows thickening of the left adrenal gland, probably representing hyperplasia.  Thoracolumbar spondylosis.  No aggressive osseous lesions.  IMPRESSION: 1.  Consolidation of the right upper lobe.  Collapse of the right lower lobe secondary to moderate to large right pleural effusion. Findings are most compatible with right upper lobe pneumonia with parapneumonic effusion.  Underlying neoplasm is not excluded. Follow-up to ensure radiographic clearing recommended.  Clearing is usually observed at 8 weeks. 2.  Cardiomegaly. 3.  Ascending aortic aneurysm measuring 44 mm.  No acute abnormalities.   Original Report Authenticated By: Andreas Newport, M.D.    Medications: I have reviewed the patient's current medications. Scheduled Meds:   . aspirin EC  81 mg Oral q morning - 10a  . carbidopa-levodopa  1 tablet Oral TID  . enoxaparin (LOVENOX) injection  40 mg Subcutaneous Q24H  . entacapone  200  mg Oral TID  . lamoTRIgine  150 mg Oral BID  . multivitamin with minerals  1 tablet Oral q morning - 10a  . neomycin-bacitracin-polymyxin  1 application Topical Daily  . pantoprazole  40 mg Oral Daily  . piperacillin-tazobactam  3.375 g Intravenous Q8H  . QUEtiapine  50 mg Oral QHS  . sertraline  150 mg Oral q morning - 10a  . vancomycin  1,000 mg Intravenous Q12H   Continuous Infusions:   . sodium chloride 125 mL/hr at 12/10/12 1819  . sodium chloride 75 mL/hr at 12/10/12 2153   PRN Meds:.clonazePAM, docusate sodium, guaiFENesin, ondansetron (ZOFRAN) IV, ondansetron, traZODone Assessment/Plan: Patient Active Hospital Problem List: HCAP (healthcare-associated pneumonia) Bipolar 1 disorder  Parkinson disease Mental status, decreased  Urinary tract infection Hypertension Diabetes mellitus, type 2  Chronic indwelling foley catheter  Parapneumonic effusion   #HCAP: He presented with complaints of productive cough with yellowish sputum with CXR and CT showing right sided parapneumonic effusion. He continues to be afebrile with his O2 sats in low 90's ( 92-93) on 3 litres. No labs from AM. Sputum cultures were not obtained.  -Continue vanc and zosyn - Mucinex for cough - Check CBC in AM - Repeat CT in 8 weeks to follow up. -Tap pleural effusion. exudate vs transudate and cultures to know the oragnism  # Probable UTI: He has chronic indwelling catheter and his UA was positive for nitrites and leucocytes with too numerous to count WBC's.  - Continue zosyn - F/U results of urine culture  #Parkinsonism: Stable - Continue Sinmet and comtan( entacapone)  # Bipolar disease: Stable.  - Continue lamictal, seroquel, zoloft and trazadone  # HTN: BP very well controlled.   # Diabetes: His last AIC was 5.2 in 09/13. I do not think that he is diabetic anymore.  # DVT: Lovenox     LOS: 1 day   Kayla Deshaies 12/11/2012, 10:03 AM

## 2012-12-11 NOTE — Progress Notes (Signed)
[facility]  Progress Note  [name] ZOX:[id number] DOB: [birth year]   PCP: Dr. Arville Lime (house calls)   Brief HPI:77 y.o. male presents to the ER as he slid off his bed and was having right sided CP. He has Hx of parkinson and had a recent fall with C2 Fx. He also has bipolar disorder, HTN, chronic indwelling catheter and frequent UTIs. The most recent UTI was with pseudomonas sensitive to Cipro, Cefepime, Zosyn, etc... He admitted to having yellow sputum productive coughs, but no fever or shortness of breath. Work up in the ER included a CXR included a right pleural base consolidation. A f/up CT showed right upper lobe consolidation with moderate pleural effusion (?parapneumonic effusion), can't exclude underlying malignancy. He has a leukocytosis with WBC of 21K, normal renal fx tests, and a UA showed pyuria. Hospitalist was asked to admit him for HCAP and UTI.   Past Medical History  Diagnosis Date  . Hearing aid worn   . Hypertension   . Parkinson disease   . Pneumonia   . Diabetes mellitus type II   . Arthritis   . Bipolar disorder   . Anxiety   . Depression   . MRSA infection (methicillin-resistant Staphylococcus aureus)   . Chronic indwelling foley catheter   . C2 cervical fracture     due to pt fall  . SIRS (systemic inflammatory response syndrome)   . Neuromuscular disorder     Consultants: None  Procedures: None  Antibiotics:  Vanc and zosyn  Subjective:   No complaints. Had last BM yesterday. No fevers or chills.   Objective: Vital signs in last 24 hours: Filed Vitals:   12/10/12 2129 12/10/12 2132 12/10/12 2241 12/11/12 0526  BP: 166/85  104/63 120/67  Pulse: 82  62 70  Temp: 100.1 F (37.8 C)  99.1 F (37.3 C) 97.9 F (36.6 C)  TempSrc: Oral  Oral Oral  Resp:   20 20  Height:   5\' 7"  (1.702 m)   Weight:   151 lb 6.4 oz (68.675 kg)   SpO2:  93% 93% 92%   Weight change:   Intake/Output Summary (Last 24 hours) at 12/11/12 1003 Last data  filed at 12/11/12 0916  Gross per 24 hour  Intake    240 ml  Output    400 ml  Net   -160 ml   BP 120/67  Pulse 70  Temp 97.9 F (36.6 C) (Oral)  Resp 20  Ht 5\' 7"  (1.702 m)  Wt 151 lb 6.4 oz (68.675 kg)  BMI 23.71 kg/m2  SpO2 92%  General Appearance:    Alert, cooperative, no distress, appears stated age  Head:    Normocephalic, without obvious abnormality, atraumatic  Eyes:    PERRL, conjunctiva/corneas clear, EOM's intact, fundi    benign, both eyes       Ears:    Normal TM's and external ear canals, both ears, hard of hearing  Nose:   Nares normal, septum midline, mucosa normal, no drainage    or sinus tenderness  Throat:   Lips, mucosa, and tongue normal; teeth and gums normal  Neck:   Supple, symmetrical, trachea midline, no adenopathy;       thyroid:  No enlargement/tenderness/nodules; no carotid   bruit or JVD  Back:     Symmetric, no curvature, ROM normal, no CVA tenderness  Lungs:     Decreased breath sounds at lung bases right >>>left, respirations unlabored  Chest wall:    No tenderness  or deformity  Heart:    Regular rate and rhythm, S1 and S2 normal, no murmur, rub   or gallop  Abdomen:     Soft, non-tender, bowel sounds active all four quadrants,    no masses, no organomegaly  Genitalia:    Normal male without lesion, discharge or tenderness  Extremities:   Extremities normal, atraumatic, no cyanosis or edema  Pulses:   2+ and symmetric all extremities  Skin:   Skin color, texture, turgor normal, no rashes or lesions  Lymph nodes:   Cervical, supraclavicular, and axillary nodes normal  Neurologic:   CNII-XII intact. Normal strength, sensation and reflexes      throughout   Lab Results: Basic Metabolic Panel:  Lab 12/10/12 9604  NA 134*  K 4.4  CL 99  CO2 25  GLUCOSE 170*  BUN 26*  CREATININE 1.00  CALCIUM 9.5  MG --  PHOS --   CBC:  Lab 12/10/12 1640  WBC 21.7*  NEUTROABS 20.0*  HGB 13.2  HCT 38.6*  MCV 84.8  PLT 386      Urinalysis:  Lab 12/10/12 1830  COLORURINE AMBER*  LABSPEC 1.033*  PHURINE 6.0  GLUCOSEU NEGATIVE  HGBUR LARGE*  BILIRUBINUR NEGATIVE  KETONESUR TRACE*  PROTEINUR 100*  UROBILINOGEN 0.2  NITRITE POSITIVE*  LEUKOCYTESUR LARGE*     Micro Results: Recent Results (from the past 240 hour(s))  MRSA PCR SCREENING     Status: Normal   Collection Time   12/10/12 10:53 PM      Component Value Range Status Comment   MRSA by PCR NEGATIVE  NEGATIVE Final    Studies/Results: Dg Ribs Unilateral W/chest Right  12/10/2012  *RADIOLOGY REPORT*  Clinical Data: Fall.  Right-sided lower rib pain.  RIGHT RIBS AND CHEST - 3+ VIEW  Comparison: 09/07/2012.  Findings: The previous chest radiographs were essentially normal. There is loculated right pleural fluid, that in the setting of a fall may represent hemothorax.  Consider follow-up noncontrast chest CT for further assessment.  Fluid tracks into the major and minor fissures.  The cardiopericardial silhouette is partially obscured.  Left basilar atelectasis is present.  Healed left-sided rib fractures. Bilateral severe glenohumeral osteoarthritis. No pneumothorax.  IMPRESSION: Moderate amount of loculated appearing right pleural opacity may represent hemothorax in the setting of trauma.  Consider follow-up chest CT without infusion to assess for hemothorax.  If the patient has infectious symptoms, chest CT with infusion is preferred. No displaced rib fractures are identified.   Original Report Authenticated By: Andreas Newport, M.D.    Ct Chest W Contrast  12/10/2012  *RADIOLOGY REPORT*  Clinical Data: Fall.  Abnormal chest radiograph.  Right-sided pleural fluid.  CT CHEST WITH CONTRAST  Technique:  Multidetector CT imaging of the chest was performed following the standard protocol during bolus administration of intravenous contrast.  Contrast: 80mL OMNIPAQUE IOHEXOL 300 MG/ML  SOLN  Comparison: Chest radiograph 12/10/2012.  Findings: Cardiomegaly is  present. Coronary artery atherosclerosis is present. If office based assessment of coronary risk factors has not been performed, it is now recommended.  There is a moderate to large right pleural effusion.  Consolidation of the right upper lobe is present.  Small right paratracheal lymph nodes are present which are probably reactive.  No subcarinal or left hilar adenopathy.  There is no airspace disease in the left lung. Pleural fluid appears low attenuation without definite loculation.  There is collapse of the left lower lobe associated with the effusion.  There are no displaced rib  fractures identified.  Severe bilateral glenohumeral osteoarthritis is present. Sub-centimeter thyroid nodule(s) noted, too small to characterize, but most likely benign in the absence of known clinical risk factors for thyroid carcinoma.  Aortic atherosclerosis.  Aneurysmal dilation of the ascending aorta, measuring 44 mm.  No acute aortic abnormality.  Small amount of pericardial fluid or thickening.  Incidental imaging of the upper abdomen shows thickening of the left adrenal gland, probably representing hyperplasia.  Thoracolumbar spondylosis.  No aggressive osseous lesions.  IMPRESSION: 1.  Consolidation of the right upper lobe.  Collapse of the right lower lobe secondary to moderate to large right pleural effusion. Findings are most compatible with right upper lobe pneumonia with parapneumonic effusion.  Underlying neoplasm is not excluded. Follow-up to ensure radiographic clearing recommended.  Clearing is usually observed at 8 weeks. 2.  Cardiomegaly. 3.  Ascending aortic aneurysm measuring 44 mm.  No acute abnormalities.   Original Report Authenticated By: Andreas Newport, M.D.    Medications: I have reviewed the patient's current medications. Scheduled Meds:   . aspirin EC  81 mg Oral q morning - 10a  . carbidopa-levodopa  1 tablet Oral TID  . enoxaparin (LOVENOX) injection  40 mg Subcutaneous Q24H  . entacapone  200  mg Oral TID  . lamoTRIgine  150 mg Oral BID  . multivitamin with minerals  1 tablet Oral q morning - 10a  . neomycin-bacitracin-polymyxin  1 application Topical Daily  . pantoprazole  40 mg Oral Daily  . piperacillin-tazobactam  3.375 g Intravenous Q8H  . QUEtiapine  50 mg Oral QHS  . sertraline  150 mg Oral q morning - 10a  . vancomycin  1,000 mg Intravenous Q12H   Continuous Infusions:   . sodium chloride 125 mL/hr at 12/10/12 1819  . sodium chloride 75 mL/hr at 12/10/12 2153   PRN Meds:.clonazePAM, docusate sodium, guaiFENesin, ondansetron (ZOFRAN) IV, ondansetron, traZODone Assessment/Plan: Patient Active Hospital Problem List: HCAP (healthcare-associated pneumonia) Bipolar 1 disorder  Parkinson disease Mental status, decreased  Urinary tract infection Hypertension Diabetes mellitus, type 2  Chronic indwelling foley catheter  Parapneumonic effusion   #HCAP: He presented with complaints of productive cough with yellowish sputum with CXR and CT showing right sided parapneumonic effusion. He continues to be afebrile with his O2 sats in low 90's ( 92-93) on 3 litres. No labs from AM. Sputum cultures were not obtained.  -Continue vanc and zosyn - Mucinex for cough - Check CBC in AM - Repeat CT in 8 weeks to follow up. -Tap pleural effusion. exudate vs transudate and cultures to know the organism (empyema).  As per up to date an effusion should be tapped if             It is free-flowing but layers >25 mm on a lateral decubitus film or CT It is loculated  It is associated with thickened parietal pleura on a contrast enhanced CT scan, a finding that is suggestive of empyema It is clearly delineated by ultrasound   # Probable UTI: He has chronic indwelling catheter and his UA was positive for nitrites and leucocytes with too numerous to count WBC's.  - Continue zosyn - F/U results of urine culture  #Parkinsonism: Stable - Continue Sinmet and comtan (entacapone)  # Bipolar  disease: Stable.  - Continue lamictal, seroquel, zoloft and trazadone  # HTN: BP very well controlled.   # Diabetes: His last AIC was 5.2 in 09/13. I do not think that he is diabetic anymore.  # DVT: Lovenox  LOS: 1 day   Lars Mage 12/11/2012, 10:03 AM

## 2012-12-12 ENCOUNTER — Inpatient Hospital Stay (HOSPITAL_COMMUNITY): Payer: Medicare Other

## 2012-12-12 LAB — BASIC METABOLIC PANEL
Calcium: 8.7 mg/dL (ref 8.4–10.5)
Chloride: 103 mEq/L (ref 96–112)
Creatinine, Ser: 0.97 mg/dL (ref 0.50–1.35)
GFR calc Af Amer: >90 mL/min (ref >90)
Sodium: 135 mEq/L (ref 135–145)

## 2012-12-12 LAB — CBC WITH DIFFERENTIAL/PLATELET
Basophils Absolute: 0 10*3/uL (ref 0.0–0.1)
Basophils Relative: 0 % (ref 0–1)
HCT: 32.8 % — ABNORMAL LOW (ref 39.0–52.0)
MCHC: 33.5 g/dL (ref 30.0–36.0)
Monocytes Absolute: 1.5 10*3/uL — ABNORMAL HIGH (ref 0.1–1.0)
Neutro Abs: 17.2 10*3/uL — ABNORMAL HIGH (ref 1.7–7.7)
Platelets: 352 10*3/uL (ref 150–400)
RDW: 14.6 % (ref 11.5–15.5)
WBC: 19.7 10*3/uL — ABNORMAL HIGH (ref 4.0–10.5)

## 2012-12-12 LAB — BODY FLUID CELL COUNT WITH DIFFERENTIAL
Eos, Fluid: 0 %
Lymphs, Fluid: 3 %
Monocyte-Macrophage-Serous Fluid: 6 % — ABNORMAL LOW (ref 50–90)
Neutrophil Count, Fluid: 91 % — ABNORMAL HIGH (ref 0–25)

## 2012-12-12 LAB — VANCOMYCIN, TROUGH: Vancomycin Tr: 12.5 ug/mL (ref 10.0–20.0)

## 2012-12-12 LAB — GLUCOSE, SEROUS FLUID: Glucose, Fluid: <20 mg/dL

## 2012-12-12 NOTE — Progress Notes (Signed)
ANTIBIOTIC CONSULT NOTE  Pharmacy Consult for Vancomycin Indication: HCAP and UTI  Allergies  Allergen Reactions  . Cholestatin Other (See Comments)    Per [facility]    Patient Measurements: Height: 5\' 7"  (170.2 cm) Weight: 151 lb 6.4 oz (68.675 kg) IBW/kg (Calculated) : 66.1  71kg  Vital Signs: Temp: 98.1 F (36.7 C) (12/26 0600) Temp src: Oral (12/26 0600) BP: 146/73 mmHg (12/26 0600) Pulse Rate: 65  (12/26 0600) Intake/Output from previous day: 12/25 0701 - 12/26 0700 In: 4213.8 [P.O.:480; I.V.:3033.8; IV Piggyback:700] Out: 650 [Urine:650] Intake/Output from this shift:    Labs:  [facility] 12/12/12 0755 12/11/12 1215 12/10/12 1640  WBC 19.7* 24.4* 21.7*  HGB 11.0* 12.0* 13.2  PLT 352 347 386  LABCREA -- -- --  CREATININE 0.97 1.09 1.00   Estimated Creatinine Clearance: 61.5 ml/min (by C-G formula based on Cr of 0.97).  Basename 12/12/12 0830  VANCOTROUGH 12.5  VANCOPEAK --  VANCORANDOM --  GENTTROUGH --  GENTPEAK --  GENTRANDOM --  TOBRATROUGH --  TOBRAPEAK --  TOBRARND --  AMIKACINPEAK --  AMIKACINTROU --  AMIKACIN --     Microbiology: Recent Results (from the past 720 hour(s))  MRSA PCR SCREENING     Status: Normal   Collection Time   12/10/12 10:53 PM      Component Value Range Status Comment   MRSA by PCR NEGATIVE  NEGATIVE Final     Medical History: Past Medical History  Diagnosis Date  . Hearing aid worn   . Hypertension   . Parkinson disease   . Pneumonia   . Diabetes mellitus type II   . Arthritis   . Bipolar disorder   . Anxiety   . Depression   . MRSA infection (methicillin-resistant Staphylococcus aureus)   . Chronic indwelling foley catheter   . C2 cervical fracture     due to pt fall  . SIRS (systemic inflammatory response syndrome)   . Neuromuscular disorder     Medications:  Anti-infectives     Start     Dose/Rate Route Frequency Ordered Stop   12/10/12 2200   piperacillin-tazobactam (ZOSYN) IVPB 3.375 g        3.375 g 12.5 mL/hr over 240 Minutes Intravenous 3 times per day 12/10/12 2139     12/10/12 2200   vancomycin (VANCOCIN) IVPB 1000 mg/200 mL premix        1,000 mg 200 mL/hr over 60 Minutes Intravenous Every 12 hours 12/10/12 2147     12/10/12 1945   cefTRIAXone (ROCEPHIN) 1 g in dextrose 5 % 50 mL IVPB  Status:  Discontinued        1 g 100 mL/hr over 30 Minutes Intravenous Every 24 hours 12/10/12 1937 12/10/12 2139   12/10/12 1945   azithromycin (ZITHROMAX) tablet 500 mg        500 mg Oral  Once 12/10/12 1937 12/10/12 2019         Assessment:  77 y/o M admitted 12/24 with HCAP and UTI, now on D#2 Vancomycin 1000 mg IV q12h / Zosyn 3.375 grams IV q8h (extended-infusion).     Afebrile, WBC improving.  Plans to tap and culture parapneumonic effusion noted.  SCr stable.  Vanc trough 12.5 - slightly below goal, but anticipate further accumulation of drug given patient's age, so do not believe a dosage increase is currently indicated.   Goal of Therapy:  Vancomycin trough level 15-20 mcg/ml Eradication of infection  Plan:   Continue vancomycin 1g IV q12h +  Zosyn 3.375 grams IV q8h (extended-infusion).  Follow serum creatinine, culture results, clinical course.  Recheck vancomycin trough if clinical course warrants.  Elie Goody, PharmD, BCPS Pager: [phone] 12/12/2012  11:21 AM

## 2012-12-12 NOTE — Procedures (Signed)
Procedure : right thoracentesis Specimen : 870 ml amber tinged fluid Complications : none immediate  Fluid sent to the lab as requested. Pt tolerated well

## 2012-12-12 NOTE — Progress Notes (Signed)
[facility]  Progress Note  [name] AVW:[id number] DOB: 1935-08-31   PCP: Dr. Arville Lime (house calls)   Brief HPI:77 y.o. male presents to the ER as he slid off his bed and was having right sided CP. He has Hx of parkinson and had a recent fall with C2 Fx. He also has bipolar disorder, HTN, chronic indwelling catheter and frequent UTIs. The most recent UTI was with pseudomonas sensitive to Cipro, Cefepime, Zosyn, etc... He admitted to having yellow sputum productive coughs, but no fever or shortness of breath. Work up in the ER included a CXR included a right pleural base consolidation. A f/up CT showed right upper lobe consolidation with moderate pleural effusion (?parapneumonic effusion), can't exclude underlying malignancy. He has a leukocytosis with WBC of 21K, normal renal fx tests, and a UA showed pyuria. Hospitalist was asked to admit him for HCAP and UTI.   Past Medical History  Diagnosis Date  . Hearing aid worn   . Hypertension   . Parkinson disease   . Pneumonia   . Diabetes mellitus type II   . Arthritis   . Bipolar disorder   . Anxiety   . Depression   . MRSA infection (methicillin-resistant Staphylococcus aureus)   . Chronic indwelling foley catheter   . C2 cervical fracture     due to pt fall  . SIRS (systemic inflammatory response syndrome)   . Neuromuscular disorder     Consultants: None  Procedures: None  Antibiotics:  Vanc and zosyn  Subjective:   No complaints.  No fevers or chills.   Objective: Vital signs in last 24 hours: Filed Vitals:   12/11/12 0526 12/11/12 1400 12/11/12 2200 12/12/12 0600  BP: 120/67 122/70 156/81 146/73  Pulse: 70 66 70 65  Temp: 97.9 F (36.6 C) 97.9 F (36.6 C) 98.6 F (37 C) 98.1 F (36.7 C)  TempSrc: Oral Oral Oral Oral  Resp: 20 18 18 18   Height:      Weight:      SpO2: 92% 94% 90% 93%   Weight change:   Intake/Output Summary (Last 24 hours) at 12/12/12 0738 Last data filed at 12/12/12 0500  Gross  per 24 hour  Intake 4213.75 ml  Output    650 ml  Net 3563.75 ml   BP 146/73  Pulse 65  Temp 98.1 F (36.7 C) (Oral)  Resp 18  Ht 5\' 7"  (1.702 m)  Wt 151 lb 6.4 oz (68.675 kg)  BMI 23.71 kg/m2  SpO2 93%  General Appearance:    Alert, cooperative, no distress, appears stated age  Head:    Normocephalic, without obvious abnormality, atraumatic  Eyes:    PERRL, conjunctiva/corneas clear, EOM's intact, fundi    benign, both eyes       Ears:    Normal TM's and external ear canals, both ears, hard of hearing  Nose:   Nares normal, septum midline, mucosa normal, no drainage    or sinus tenderness  Throat:   Lips, mucosa, and tongue normal; teeth and gums normal  Neck:   Supple, symmetrical, trachea midline, no adenopathy;       thyroid:  No enlargement/tenderness/nodules; no carotid   bruit or JVD  Back:     Symmetric, no curvature, ROM normal, no CVA tenderness  Lungs:     Decreased breath sounds at lung bases right >>>left, respirations unlabored  Chest wall:    No tenderness or deformity  Heart:    Regular rate and rhythm, S1 and  S2 normal, no murmur, rub   or gallop  Abdomen:     Soft, non-tender, bowel sounds active all four quadrants,    no masses, no organomegaly  Extremities:   Extremities normal, atraumatic, no cyanosis or edema  Pulses:   2+ and symmetric all extremities  Skin:   Skin color, texture, turgor normal, no rashes or lesions  Lymph nodes:   Cervical, supraclavicular, and axillary nodes normal  Neurologic:   CNII-XII intact. Normal strength, sensation and reflexes      throughout   Lab Results: Basic Metabolic Panel:  Lab 12/11/12 0454 12/10/12 1640  NA 135 134*  K 3.8 4.4  CL 101 99  CO2 26 25  GLUCOSE 111* 170*  BUN 24* 26*  CREATININE 1.09 1.00  CALCIUM 8.7 9.5  MG -- --  PHOS -- --   CBC:  Lab 12/11/12 1215 12/10/12 1640  WBC 24.4* 21.7*  NEUTROABS 22.2* 20.0*  HGB 12.0* 13.2  HCT 35.0* 38.6*  MCV 84.7 84.8  PLT 347 386      Urinalysis:  Lab 12/10/12 1830  COLORURINE AMBER*  LABSPEC 1.033*  PHURINE 6.0  GLUCOSEU NEGATIVE  HGBUR LARGE*  BILIRUBINUR NEGATIVE  KETONESUR TRACE*  PROTEINUR 100*  UROBILINOGEN 0.2  NITRITE POSITIVE*  LEUKOCYTESUR LARGE*     Micro Results: Recent Results (from the past 240 hour(s))  MRSA PCR SCREENING     Status: Normal   Collection Time   12/10/12 10:53 PM      Component Value Range Status Comment   MRSA by PCR NEGATIVE  NEGATIVE Final    Studies/Results: Dg Ribs Unilateral W/chest Right  12/10/2012  *RADIOLOGY REPORT*  Clinical Data: Fall.  Right-sided lower rib pain.  RIGHT RIBS AND CHEST - 3+ VIEW  Comparison: 09/07/2012.  Findings: The previous chest radiographs were essentially normal. There is loculated right pleural fluid, that in the setting of a fall may represent hemothorax.  Consider follow-up noncontrast chest CT for further assessment.  Fluid tracks into the major and minor fissures.  The cardiopericardial silhouette is partially obscured.  Left basilar atelectasis is present.  Healed left-sided rib fractures. Bilateral severe glenohumeral osteoarthritis. No pneumothorax.  IMPRESSION: Moderate amount of loculated appearing right pleural opacity may represent hemothorax in the setting of trauma.  Consider follow-up chest CT without infusion to assess for hemothorax.  If the patient has infectious symptoms, chest CT with infusion is preferred. No displaced rib fractures are identified.   Original Report Authenticated By: Andreas Newport, M.D.    Ct Chest W Contrast  12/10/2012  *RADIOLOGY REPORT*  Clinical Data: Fall.  Abnormal chest radiograph.  Right-sided pleural fluid.  CT CHEST WITH CONTRAST  Technique:  Multidetector CT imaging of the chest was performed following the standard protocol during bolus administration of intravenous contrast.  Contrast: 80mL OMNIPAQUE IOHEXOL 300 MG/ML  SOLN  Comparison: Chest radiograph 12/10/2012.  Findings: Cardiomegaly is  present. Coronary artery atherosclerosis is present. If office based assessment of coronary risk factors has not been performed, it is now recommended.  There is a moderate to large right pleural effusion.  Consolidation of the right upper lobe is present.  Small right paratracheal lymph nodes are present which are probably reactive.  No subcarinal or left hilar adenopathy.  There is no airspace disease in the left lung. Pleural fluid appears low attenuation without definite loculation.  There is collapse of the left lower lobe associated with the effusion.  There are no displaced rib fractures identified.  Severe bilateral  glenohumeral osteoarthritis is present. Sub-centimeter thyroid nodule(s) noted, too small to characterize, but most likely benign in the absence of known clinical risk factors for thyroid carcinoma.  Aortic atherosclerosis.  Aneurysmal dilation of the ascending aorta, measuring 44 mm.  No acute aortic abnormality.  Small amount of pericardial fluid or thickening.  Incidental imaging of the upper abdomen shows thickening of the left adrenal gland, probably representing hyperplasia.  Thoracolumbar spondylosis.  No aggressive osseous lesions.  IMPRESSION: 1.  Consolidation of the right upper lobe.  Collapse of the right lower lobe secondary to moderate to large right pleural effusion. Findings are most compatible with right upper lobe pneumonia with parapneumonic effusion.  Underlying neoplasm is not excluded. Follow-up to ensure radiographic clearing recommended.  Clearing is usually observed at 8 weeks. 2.  Cardiomegaly. 3.  Ascending aortic aneurysm measuring 44 mm.  No acute abnormalities.   Original Report Authenticated By: Andreas Newport, M.D.    Medications: I have reviewed the patient's current medications. Scheduled Meds:    . aspirin EC  81 mg Oral q morning - 10a  . carbidopa-levodopa  1 tablet Oral TID  . enoxaparin (LOVENOX) injection  40 mg Subcutaneous Q24H  . entacapone  200  mg Oral TID  . lamoTRIgine  150 mg Oral BID  . multivitamin with minerals  1 tablet Oral q morning - 10a  . neomycin-bacitracin-polymyxin  1 application Topical Daily  . pantoprazole  40 mg Oral Daily  . piperacillin-tazobactam  3.375 g Intravenous Q8H  . QUEtiapine  50 mg Oral QHS  . sertraline  150 mg Oral q morning - 10a  . vancomycin  1,000 mg Intravenous Q12H   Continuous Infusions:    . sodium chloride 125 mL/hr at 12/10/12 1819  . sodium chloride 75 mL/hr at 12/10/12 2153   PRN Meds:.clonazePAM, docusate sodium, guaiFENesin, ondansetron (ZOFRAN) IV, ondansetron, traZODone Assessment/Plan: Patient Active Hospital Problem List: HCAP (healthcare-associated pneumonia) Bipolar 1 disorder  Parkinson disease Mental status, decreased  Urinary tract infection Hypertension Diabetes mellitus, type 2  Chronic indwelling foley catheter  Parapneumonic effusion   #HCAP: He presented with complaints of productive cough with yellowish sputum with CXR and CT showing right sided parapneumonic effusion. He continues to be afebrile. No labs from AM. Sputum cultures were not obtained.  -Continue vanc and zosyn for now - Mucinex for cough - Check CBC in AM - Repeat CT in 8 weeks to follow up. -Tap pleural effusion. exudate vs transudate and cultures to know the organism (empyema).  -Will d/c lovenox and place on SCD's  As per up to date an effusion should be tapped if             It is free-flowing but layers >25 mm on a lateral decubitus film or CT It is loculated  It is associated with thickened parietal pleura on a contrast enhanced CT scan, a finding that is suggestive of empyema It is clearly delineated by ultrasound Check cell count, gram stain, culture, LDH, glucose and pH. Unable to obtain thoracentesis yesterday as radiology staff was short due to christmas.  # Probable UTI: Asymptomatic. He has chronic indwelling catheter and his UA was positive for nitrites and leucocytes with  too numerous to count WBC's.  - Continue zosyn - Urine culture in process  #Parkinsonism: Stable - Continue Sinmet and comtan (entacapone)  # Bipolar disease: Stable.  - Continue lamictal, seroquel, zoloft and trazadone  # HTN: BP very well controlled.   # Diabetes: His last AIC  was 5.2 in 09/13. I do not think that he is diabetic anymore.  # DVT: SCD's  Dispo: Patient is at assisted living facility and would wanna go home from here.     LOS: 2 days   Lars Mage 12/12/2012, 7:38 AM

## 2012-12-13 LAB — URINE CULTURE: Colony Count: >=100000

## 2012-12-13 LAB — PH, BODY FLUID: pH, Fluid: 7

## 2012-12-13 LAB — PATHOLOGIST SMEAR REVIEW

## 2012-12-13 MED ORDER — GUAIFENESIN-CODEINE 100-10 MG/5ML PO SOLN
10.0000 mL | Freq: Four times a day (QID) | ORAL | Status: DC | PRN
  Administered 2012-12-13: 10 mL via ORAL
  Filled 2012-12-13: qty 10

## 2012-12-13 NOTE — Plan of Care (Signed)
Problem: Phase I Progression Outcomes Goal: Voiding-avoid urinary catheter unless indicated Outcome: Not Met (add Reason) Patient has a chronic foley catheter, came to hospital with in place

## 2012-12-13 NOTE — Progress Notes (Signed)
Report received from [facility] Lab:  Urine Culture on specimen dated 12/11/2012 has >/= 100,000 colony count of MRSA.  Notified Dr. Eben Burow.  Placed on isolation

## 2012-12-13 NOTE — Consult Note (Signed)
Reason for Consult:Right pleural effusion Referring Physician: Dr. Damita Dunnings is an 77 y.o. male.  HPI: 77 yo WM with bipolar disorder and Parkinson's. He is accompanied by his caregiver from [employer]. Most of the history is given by the caregiver.   He developed a persistent cough about 2 weeks ago. This worsened and he was treated with mucinex. He presented to the ED after a fall/ slide off bed and was c/o right sided CP.  A CXR in the ED showed right lung consolidation and an effusion. A CT showed a pleural effusion with compressive atelectasis of the RLL and consolidation of the right upper lobe. He had a thoracentesis and 800 ml of fluid was obtained. Fluid c/w exudate. Initial improvement after thoracentesis, but now says he's about the same as when he came in.  Past Medical History  Diagnosis Date  . Hearing aid worn   . Hypertension   . Parkinson disease   . Pneumonia   . Diabetes mellitus type II   . Arthritis   . Bipolar disorder   . Anxiety   . Depression   . MRSA infection (methicillin-resistant Staphylococcus aureus)   . Chronic indwelling foley catheter   . C2 cervical fracture     due to pt fall  . SIRS (systemic inflammatory response syndrome)   . Neuromuscular disorder     Past Surgical History  Procedure Date  . Total hip arthroplasty     Family History  Problem Relation Age of Onset  . Depression Father     Social History:  reports that he has never smoked. He has never used smokeless tobacco. He reports that he drinks alcohol. He reports that he does not use illicit drugs.  Allergies:  Allergies  Allergen Reactions  . Cholestatin Other (See Comments)    Per MAR    Medications:  Scheduled:   . aspirin EC  81 mg Oral q morning - 10a  . carbidopa-levodopa  1 tablet Oral TID  . entacapone  200 mg Oral TID  . lamoTRIgine  150 mg Oral BID  . multivitamin with minerals  1 tablet Oral q morning - 10a  . neomycin-bacitracin-polymyxin   1 application Topical Daily  . pantoprazole  40 mg Oral Daily  . piperacillin-tazobactam  3.375 g Intravenous Q8H  . QUEtiapine  50 mg Oral QHS  . sertraline  150 mg Oral q morning - 10a  . vancomycin  1,000 mg Intravenous Q12H    Results for orders placed during the hospital encounter of 12/10/12 (from the past 48 hour(s))  CBC WITH DIFFERENTIAL     Status: Abnormal   Collection Time   12/12/12  7:55 AM      Component Value Range Comment   WBC 19.7 (*) 4.0 - 10.5 K/uL    RBC 3.87 (*) 4.22 - 5.81 MIL/uL    Hemoglobin 11.0 (*) 13.0 - 17.0 g/dL    HCT 81.1 (*) 91.4 - 52.0 %    MCV 84.8  78.0 - 100.0 fL    MCH 28.4  26.0 - 34.0 pg    MCHC 33.5  30.0 - 36.0 g/dL    RDW 78.2  95.6 - 21.3 %    Platelets 352  150 - 400 K/uL    Neutrophils Relative 87 (*) 43 - 77 %    Neutro Abs 17.2 (*) 1.7 - 7.7 K/uL    Lymphocytes Relative 4 (*) 12 - 46 %    Lymphs Abs 0.9  0.7 - 4.0 K/uL    Monocytes Relative 7  3 - 12 %    Monocytes Absolute 1.5 (*) 0.1 - 1.0 K/uL    Eosinophils Relative 1  0 - 5 %    Eosinophils Absolute 0.1  0.0 - 0.7 K/uL    Basophils Relative 0  0 - 1 %    Basophils Absolute 0.0  0.0 - 0.1 K/uL   BASIC METABOLIC PANEL     Status: Abnormal   Collection Time   12/12/12  7:55 AM      Component Value Range Comment   Sodium 135  135 - 145 mEq/L    Potassium 3.7  3.5 - 5.1 mEq/L    Chloride 103  96 - 112 mEq/L    CO2 24  19 - 32 mEq/L    Glucose, Bld 115 (*) 70 - 99 mg/dL    BUN 25 (*) 6 - 23 mg/dL    Creatinine, Ser 1.61  0.50 - 1.35 mg/dL    Calcium 8.7  8.4 - 09.6 mg/dL    GFR calc non Af Amer 79 (*) >90 mL/min    GFR calc Af Amer >90  >90 mL/min   VANCOMYCIN, TROUGH     Status: Normal   Collection Time   12/12/12  8:30 AM      Component Value Range Comment   Vancomycin Tr 12.5  10.0 - 20.0 ug/mL   BODY FLUID CELL COUNT WITH DIFFERENTIAL     Status: Abnormal   Collection Time   12/12/12  2:22 PM      Component Value Range Comment   Fluid Type-FCT PLEURAL    CORRECTED ON 12/26 AT 1510: PREVIOUSLY REPORTED AS Body Fluid   Color, Fluid YELLOW  YELLOW    Appearance, Fluid CLOUDY (*) CLEAR    WBC, Fluid 3880 (*) 0 - 1000 cu mm    Neutrophil Count, Fluid 91 (*) 0 - 25 %    Lymphs, Fluid 3      Monocyte-Macrophage-Serous Fluid 6 (*) 50 - 90 %    Eos, Fluid 0     PH, BODY FLUID     Status: Normal   Collection Time   12/12/12  2:22 PM      Component Value Range Comment   pH, Fluid Type     CORRECTED ON 12/27 AT 0409: PREVIOUSLY REPORTED AS PLEURAL CORRECTED ON 12/26 AT 1510: PREVIOUSLY REPORTED AS Body Fluid   Value: CORRECTED ON 12/26 AT 1510: PREVIOUSLY REPORTED AS BODY FLUID   pH, Fluid 7.00     PROTEIN, BODY FLUID     Status: Normal   Collection Time   12/12/12  2:22 PM      Component Value Range Comment   Total protein, fluid 3.6   NO NORMAL RANGE ESTABLISHED FOR THIS TEST   Fluid Type-FTP PLEURAL   CORRECTED ON 12/26 AT 1510: PREVIOUSLY REPORTED AS Body Fluid  LACTATE DEHYDROGENASE, BODY FLUID     Status: Abnormal   Collection Time   12/12/12  2:22 PM      Component Value Range Comment   LD, Fluid 1386 (*) 3 - 23 U/L    Fluid Type-FLDH PLEURAL   CORRECTED ON 12/26 AT 1510: PREVIOUSLY REPORTED AS Body Fluid  BODY FLUID CULTURE     Status: Normal (Preliminary result)   Collection Time   12/12/12  2:22 PM      Component Value Range Comment   Specimen Description PLEURAL      Special  Requests Normal      Gram Stain        Value: RARE WBC PRESENT,BOTH PMN AND MONONUCLEAR     NO ORGANISMS SEEN   Culture NO GROWTH      Report Status PENDING     GLUCOSE, SEROUS FLUID     Status: Normal   Collection Time   12/12/12  2:22 PM      Component Value Range Comment   Glucose, Fluid <20      Fluid Type-FGLU PLEURAL   CORRECTED ON 12/26 AT 1509: PREVIOUSLY REPORTED AS Body Fluid  PATHOLOGIST SMEAR REVIEW     Status: Normal   Collection Time   12/12/12  2:22 PM      Component Value Range Comment   Path Review Reviewed By Havery Moros, M.D.        Dg Chest 1 View  12/12/2012  *RADIOLOGY REPORT*  Clinical Data: Status post thoracentesis.  CHEST - 1 VIEW  Comparison: Chest x-ray 12/10/2012.  Findings: Irregular areas of interstitial prominence and air space consolidation throughout the right lung with a peripherally loculated moderate - large right-sided pleural effusion which appears similar to slightly increased compared to prior study from 12/10/2012.  Left lung is clear. No pneumothorax.  Pulmonary vasculature is within normal limits.  Heart size is mildly enlarged.  Upper mediastinal contours are unremarkable. Atherosclerosis in the thoracic aorta. Multiple old healed left- sided rib fractures are again noted.  IMPRESSION: 1.  Multifocal interstitial and airspace disease throughout the right lung, concerning for multilobar pneumonia (most severe in the right upper lobe). 2.  Moderate - large partially loculated right-sided pleural effusion with passive atelectasis in the right lung base. 3.  Mild cardiomegaly. 4.  Atherosclerosis.   Original Report Authenticated By: Trudie Reed, M.D.    US Thoracentesis Asp Pleural Space W/img Guide  12/12/2012  *RADIOLOGY REPORT*  Clinical Data:  Pneumonia with right pleural effusion.  Request has been made for therapeutic and diagnostic right-sided thoracentesis.  ULTRASOUND GUIDED right THORACENTESIS  Comparison:  Prior chest imaging  An ultrasound guided thoracentesis was thoroughly discussed with the patient and questions answered.  The benefits, risks, alternatives and complications were also discussed.  The patient understands and wishes to proceed with the procedure.  Written consent was obtained.  Ultrasound was performed to localize and mark an adequate pocket of fluid in the right chest.  The area was then prepped and draped in the normal sterile fashion.  1% Lidocaine was used for local anesthesia.  Under ultrasound guidance a 19 gauge Yueh catheter was introduced.  Thoracentesis was performed.   The catheter was removed and a dressing applied.  Complications:  None immediate  Findings: A total of approximately 870 ml of amber-colored fluid was removed. A fluid sample was sent for laboratory analysis.  IMPRESSION: Successful ultrasound guided right thoracentesis yielding 870 ml of pleural fluid.  Read by: Anselm Pancoast, P.A.-C   Original Report Authenticated By: Richarda Overlie, M.D.     CT Chest 12/10/12 IMPRESSION:  1. Consolidation of the right upper lobe. Collapse of the right  lower lobe secondary to moderate to large right pleural effusion.  Findings are most compatible with right upper lobe pneumonia with  parapneumonic effusion. Underlying neoplasm is not excluded.  Follow-up to ensure radiographic clearing recommended. Clearing is  usually observed at 8 weeks.  2. Cardiomegaly.  3. Ascending aortic aneurysm measuring 44 mm. No acute  abnormalities.   Review of Systems  Constitutional: Positive for  fever. Negative for chills.  Respiratory: Positive for cough, sputum production, shortness of breath and wheezing. Negative for hemoptysis.   Cardiovascular: Positive for chest pain (right sided).  Genitourinary:       Chronic UTI  Neurological: Positive for weakness.       Parkinson's tremor Walks with walker and assist  Psychiatric/Behavioral:       Bipolar   Blood pressure 133/69, pulse 57, temperature 99 F (37.2 C), temperature source Oral, resp. rate 18, height 5\' 7"  (1.702 m), weight 151 lb 6.4 oz (68.675 kg), SpO2 92.00%. Physical Exam  Vitals reviewed. Constitutional: He is oriented to person, place, and time. He appears well-developed and well-nourished. No distress.  HENT:  Head: Normocephalic and atraumatic.  Eyes: EOM are normal. Pupils are equal, round, and reactive to light.  Neck: Neck supple. No thyromegaly present.  Cardiovascular: Normal rate, regular rhythm and normal heart sounds.  Exam reveals no friction rub.   No murmur heard. Respiratory: No  respiratory distress. He has wheezes.       Coarse rhonchi and wheezes bilaterally  GI: Soft. There is no tenderness.  Musculoskeletal: He exhibits no edema.  Lymphadenopathy:    He has no cervical adenopathy.  Neurological: He is alert and oriented to person, place, and time. No cranial nerve deficit.  Skin: Skin is warm and dry.    Assessment/Plan: 77 yo WM with bipolar disorder and Parkinson's with a RUL pneumonia and a right parapneumonic effusion versus empyema. He is not currently in distress and is on appropriate IV antibiotic coverage.  CXR showed persistent loculated fluid after thoracentesis. This was mostly lateral and might be amenable to percutaneous drainage with a pigtail catheter in IR. If not, the only other options are medical therapy and thoracoscopic drainage. There is no role for a blind chest tube in his case. Although VATS drainage is the ideal approach in terms of draining the fluid, he is a very high risk surgical candidate and may have a poor outcome.   I have ordered a repeat CXR with decubitus films for tomorrow morning. After reviewing those we'll have a better idea how to proceed.   Makynzee Tigges C 12/13/2012, 6:18 PM

## 2012-12-13 NOTE — Progress Notes (Signed)
[facility]  Progress Note  [name] ZOX:[id number] DOB: 09-22-1935   PCP: Dr. Arville Lime (house calls)   Brief HPI:77 y.o. male with PMH significant for parkinsonism, recent fall with C2 fx, presents to the ER as he slid off his bed and was having right sided CP.He also has bipolar disorder, HTN, chronic indwelling catheter and frequent UTIs. The most recent UTI was with pseudomonas sensitive to Cipro, Cefepime, Zosyn, etc... He admitted to having yellow sputum productive coughs, but no fever or shortness of breath. Work up in the ER included a CXR included a right pleural base consolidation. A f/up CT showed right upper lobe consolidation with moderate pleural effusion (parapneumonic effusion), can't exclude underlying malignancy.  Past Medical History  Diagnosis Date  . Hearing aid worn   . Hypertension   . Parkinson disease   . Pneumonia   . Diabetes mellitus type II   . Arthritis   . Bipolar disorder   . Anxiety   . Depression   . MRSA infection (methicillin-resistant Staphylococcus aureus)   . Chronic indwelling foley catheter   . C2 cervical fracture     due to pt fall  . SIRS (systemic inflammatory response syndrome)   . Neuromuscular disorder     Consultants: None  Procedures: None  Antibiotics:  Vanc and zosyn  Subjective:  No complaints. No fevers or chills. Complains of pain in his chest every time he coughs.  Objective: Vital signs in last 24 hours: Filed Vitals:   12/12/12 1440 12/12/12 1602 12/12/12 2122 12/13/12 0531  BP: 107/65 107/65 151/67 115/74  Pulse:   88 62  Temp:   99.1 F (37.3 C) 97.5 F (36.4 C)  TempSrc:   Oral Axillary  Resp:  16 18 18   Height:      Weight:      SpO2:  100% 100% 93%   Weight change:   Intake/Output Summary (Last 24 hours) at 12/13/12 1131 Last data filed at 12/13/12 0532  Gross per 24 hour  Intake   2815 ml  Output   1400 ml  Net   1415 ml   BP 115/74  Pulse 62  Temp 97.5 F (36.4 C) (Axillary)   Resp 18  Ht 5\' 7"  (1.702 m)  Wt 151 lb 6.4 oz (68.675 kg)  BMI 23.71 kg/m2  SpO2 93%  General Appearance:    Alert, cooperative, no distress, appears stated age  Head:    Normocephalic, without obvious abnormality, atraumatic  Eyes:    PERRL, conjunctiva/corneas clear, EOM's intact, fundi    benign, both eyes       Ears:    Normal TM's and external ear canals, both ears, hard of hearing  Nose:   Nares normal, septum midline, mucosa normal, no drainage    or sinus tenderness  Throat:   Lips, mucosa, and tongue normal; teeth and gums normal  Neck:   Supple, symmetrical, trachea midline, no adenopathy;       thyroid:  No enlargement/tenderness/nodules; no carotid   bruit or JVD  Back:     Symmetric, no curvature, ROM normal, no CVA tenderness  Lungs:     Decreased breath sounds bilaterally, respirations unlabored, No ronchi, crackles or wheeze  Chest wall:    No tenderness or deformity  Heart:    Regular rate and rhythm, S1 and S2 normal, no murmur, rub   or gallop  Abdomen:     Soft, non-tender, bowel sounds active all four quadrants,    no  masses, no organomegaly  Extremities:   Extremities normal, atraumatic, no cyanosis or edema  Pulses:   2+ and symmetric all extremities  Skin:   Skin color, texture, turgor normal, no rashes or lesions  Lymph nodes:   Cervical, supraclavicular, and axillary nodes normal  Neurologic:   CNII-XII intact. Normal strength, sensation and reflexes      throughout   Lab Results: Basic Metabolic Panel:  Lab 12/12/12 1610 12/11/12 1215  NA 135 135  K 3.7 3.8  CL 103 101  CO2 24 26  GLUCOSE 115* 111*  BUN 25* 24*  CREATININE 0.97 1.09  CALCIUM 8.7 8.7  MG -- --  PHOS -- --   CBC:  Lab 12/12/12 0755 12/11/12 1215  WBC 19.7* 24.4*  NEUTROABS 17.2* 22.2*  HGB 11.0* 12.0*  HCT 32.8* 35.0*  MCV 84.8 84.7  PLT 352 347     Urinalysis:  Lab 12/10/12 1830  COLORURINE AMBER*  LABSPEC 1.033*  PHURINE 6.0  GLUCOSEU NEGATIVE  HGBUR LARGE*    BILIRUBINUR NEGATIVE  KETONESUR TRACE*  PROTEINUR 100*  UROBILINOGEN 0.2  NITRITE POSITIVE*  LEUKOCYTESUR LARGE*     Micro Results: Recent Results (from the past 240 hour(s))  URINE CULTURE     Status: Normal (Preliminary result)   Collection Time   12/10/12  6:30 PM      Component Value Range Status Comment   Specimen Description URINE, CLEAN CATCH   Final    Special Requests NONE   Final    Culture  Setup Time 12/11/2012 02:29   Final    Colony Count >=100,000 COLONIES/ML   Final    Culture     Final    Value: STAPHYLOCOCCUS AUREUS     Note: RIFAMPIN AND GENTAMICIN SHOULD NOT BE USED AS SINGLE DRUGS FOR TREATMENT OF STAPH INFECTIONS.   Report Status PENDING   Incomplete   MRSA PCR SCREENING     Status: Normal   Collection Time   12/10/12 10:53 PM      Component Value Range Status Comment   MRSA by PCR NEGATIVE  NEGATIVE Final   CULTURE, BLOOD (ROUTINE X 2)     Status: Normal (Preliminary result)   Collection Time   12/11/12  2:00 PM      Component Value Range Status Comment   Specimen Description BLOOD RIGHT ARM   Final    Special Requests BOTTLES DRAWN AEROBIC AND ANAEROBIC 6CC   Final    Culture  Setup Time 12/12/2012 00:25   Final    Culture     Final    Value:        BLOOD CULTURE RECEIVED NO GROWTH TO DATE CULTURE WILL BE HELD FOR 5 DAYS BEFORE ISSUING A FINAL NEGATIVE REPORT   Report Status PENDING   Incomplete   CULTURE, BLOOD (ROUTINE X 2)     Status: Normal (Preliminary result)   Collection Time   12/11/12  2:07 PM      Component Value Range Status Comment   Specimen Description BLOOD RIGHT ARM   Final    Special Requests BOTTLES DRAWN AEROBIC AND ANAEROBIC 10CC   Final    Culture  Setup Time 12/12/2012 00:25   Final    Culture     Final    Value:        BLOOD CULTURE RECEIVED NO GROWTH TO DATE CULTURE WILL BE HELD FOR 5 DAYS BEFORE ISSUING A FINAL NEGATIVE REPORT   Report Status PENDING   Incomplete  BODY FLUID CULTURE     Status: Normal (Preliminary  result)   Collection Time   12/12/12  2:22 PM      Component Value Range Status Comment   Specimen Description PLEURAL   Final    Special Requests Normal   Final    Gram Stain     Final    Value: RARE WBC PRESENT,BOTH PMN AND MONONUCLEAR     NO ORGANISMS SEEN   Culture PENDING   Incomplete    Report Status PENDING   Incomplete    Studies/Results: Dg Chest 1 View  12/12/2012  *RADIOLOGY REPORT*  Clinical Data: Status post thoracentesis.  CHEST - 1 VIEW  Comparison: Chest x-ray 12/10/2012.  Findings: Irregular areas of interstitial prominence and air space consolidation throughout the right lung with a peripherally loculated moderate - large right-sided pleural effusion which appears similar to slightly increased compared to prior study from 12/10/2012.  Left lung is clear. No pneumothorax.  Pulmonary vasculature is within normal limits.  Heart size is mildly enlarged.  Upper mediastinal contours are unremarkable. Atherosclerosis in the thoracic aorta. Multiple old healed left- sided rib fractures are again noted.  IMPRESSION: 1.  Multifocal interstitial and airspace disease throughout the right lung, concerning for multilobar pneumonia (most severe in the right upper lobe). 2.  Moderate - large partially loculated right-sided pleural effusion with passive atelectasis in the right lung base. 3.  Mild cardiomegaly. 4.  Atherosclerosis.   Original Report Authenticated By: Trudie Reed, M.D.    US Thoracentesis Asp Pleural Space W/img Guide  12/12/2012  *RADIOLOGY REPORT*  Clinical Data:  Pneumonia with right pleural effusion.  Request has been made for therapeutic and diagnostic right-sided thoracentesis.  ULTRASOUND GUIDED right THORACENTESIS  Comparison:  Prior chest imaging  An ultrasound guided thoracentesis was thoroughly discussed with the patient and questions answered.  The benefits, risks, alternatives and complications were also discussed.  The patient understands and wishes to proceed  with the procedure.  Written consent was obtained.  Ultrasound was performed to localize and mark an adequate pocket of fluid in the right chest.  The area was then prepped and draped in the normal sterile fashion.  1% Lidocaine was used for local anesthesia.  Under ultrasound guidance a 19 gauge Yueh catheter was introduced.  Thoracentesis was performed.  The catheter was removed and a dressing applied.  Complications:  None immediate  Findings: A total of approximately 870 ml of amber-colored fluid was removed. A fluid sample was sent for laboratory analysis.  IMPRESSION: Successful ultrasound guided right thoracentesis yielding 870 ml of pleural fluid.  Read by: Anselm Pancoast, P.A.-C   Original Report Authenticated By: Richarda Overlie, M.D.    Medications: I have reviewed the patient's current medications. Scheduled Meds:    . aspirin EC  81 mg Oral q morning - 10a  . carbidopa-levodopa  1 tablet Oral TID  . entacapone  200 mg Oral TID  . lamoTRIgine  150 mg Oral BID  . multivitamin with minerals  1 tablet Oral q morning - 10a  . neomycin-bacitracin-polymyxin  1 application Topical Daily  . pantoprazole  40 mg Oral Daily  . piperacillin-tazobactam  3.375 g Intravenous Q8H  . QUEtiapine  50 mg Oral QHS  . sertraline  150 mg Oral q morning - 10a  . vancomycin  1,000 mg Intravenous Q12H   Continuous Infusions:    . sodium chloride 125 mL/hr at 12/10/12 1819  . sodium chloride 75 mL/hr at 12/13/12 [phone]  PRN Meds:.clonazePAM, docusate sodium, guaiFENesin, ondansetron (ZOFRAN) IV, ondansetron, traZODone Assessment/Plan: Patient Active Hospital Problem List: HCAP (healthcare-associated pneumonia) Bipolar 1 disorder  Parkinson disease Mental status, decreased  Urinary tract infection Hypertension Diabetes mellitus, type 2  Chronic indwelling foley catheter  Parapneumonic effusion   Parapneumonic effusion: s/p ultrasound guided thoracentesis on 12/26. Results of pleural fluid show  pH-7.00, elevated WBC with neutrophil predominance and elevated LDH up to 1386 and pH. As per Light's criteria, pleural fluid protein/serum protein ratio is greater than 0.5 and pleural LDH is more than 2/3 rd of upper limit of normal for serum LDH, which favors exudative effusion. With glucose <20 , differentials include empyema vs malignancy vs RA vs TB. He has no signs of RA or TB .Given the elevated white count and neutrophilic predominance, I think the effusion is likely consistent with empyema. He had low grade temp of 99.1 last night . Also, he continues to have elevated white count to 19.7. -Consult CVTS for chest tube placement for empyema -Continue vanc and zosyn for now - Codeine for cough - Check CBC in AM - Repeat CT in 8 weeks to follow up.   Chronic Indwelling foley's catheter: Though , his UA was positive for nitrites and leucocytes with too numerous to count WBC's. Urine culture shows 100,000 colonies of staph aureus but he is ASYMPTOMATIC and has chronic indwelling catheter. Current;y on vancomycin anyways which should cover MRSA.  Parkinsonism: Stable . Would continue Sinmet and comtan (entacapone)  Bipolar disease: Stable. Would continue lamictal, seroquel, zoloft and trazadone  HTN: BP very well controlled.   DVT: SCD's  Dispo: Patient is at assisted living facility and would wanna go home from here.  Code: Full  Family discussion: none     LOS: 3 days   [name] 12/13/2012, 11:31 AM [facility]  Pager [phone]   If 8PM-8AM, please contact night-coverage at [email or web address], password [facility]

## 2012-12-13 NOTE — Clinical Social Work Psychosocial (Unsigned)
     Clinical Social Work Department BRIEF PSYCHOSOCIAL ASSESSMENT 12/13/2012  Patient:  JUNAID, WURZER     Account Number:  [id number]     Admit date:  12/10/2012  Clinical Social Worker:  Hattie Perch  Date/Time:  12/13/2012 12:00 M  Referred by:  Physician  Date Referred:  12/13/2012 Referred for  ALF Placement   Other Referral:   Interview type:  Patient Other interview type:    PSYCHOSOCIAL DATA Living Status:  FACILITY Admitted from facility:  [name] Level of care:  Assisted Living Primary support name:  carolyn Darin Primary support relationship to patient:  NONE Degree of support available:   good    CURRENT CONCERNS Current Concerns  Post-Acute Placement   Other Concerns:    SOCIAL WORK ASSESSMENT / PLAN Patient is a resident at [facility] and is agreeable to return upon discharge. [facility] can take patient on the weekend if they have the med rec by today.   Assessment/plan status:   Other assessment/ plan:   Information/referral to community resources:    PATIENTS/FAMILYS RESPONSE TO PLAN OF CARE: agreeable to return to [facility] when medically stable.

## 2012-12-13 NOTE — Progress Notes (Signed)
Patient appears worried with people being angry with him, still appears disoriented about time place and why he is here.  Asked that his oxygen be restarted for comfort , states he is a worry wart.

## 2012-12-14 ENCOUNTER — Inpatient Hospital Stay (HOSPITAL_COMMUNITY): Payer: Medicare Other

## 2012-12-14 LAB — COMPREHENSIVE METABOLIC PANEL
ALT: <5 U/L (ref 0–53)
AST: 10 U/L (ref 0–37)
Albumin: 2 g/dL — ABNORMAL LOW (ref 3.5–5.2)
Alkaline Phosphatase: 73 U/L (ref 39–117)
Potassium: 3.5 mEq/L (ref 3.5–5.1)
Sodium: 138 mEq/L (ref 135–145)
Total Protein: 5 g/dL — ABNORMAL LOW (ref 6.0–8.3)

## 2012-12-14 LAB — CBC WITH DIFFERENTIAL/PLATELET
Basophils Absolute: 0 10*3/uL (ref 0.0–0.1)
Basophils Relative: 0 % (ref 0–1)
Eosinophils Absolute: 0.5 10*3/uL (ref 0.0–0.7)
MCH: 28.1 pg (ref 26.0–34.0)
MCHC: 32.6 g/dL (ref 30.0–36.0)
Neutrophils Relative %: 80 % — ABNORMAL HIGH (ref 43–77)
Platelets: 386 10*3/uL (ref 150–400)
RBC: 3.88 MIL/uL — ABNORMAL LOW (ref 4.22–5.81)

## 2012-12-14 NOTE — Progress Notes (Signed)
  Subjective: Feels about the same  Objective: Vital signs in last 24 hours: Temp:  [97.6 F (36.4 C)-99 F (37.2 C)] 97.6 F (36.4 C) (12/28 0630) Pulse Rate:  [57-77] 77  (12/28 0630) Cardiac Rhythm:  [-]  Resp:  [18] 18  (12/27 2200) BP: (111-175)/(56-74) 111/56 mmHg (12/28 0630) SpO2:  [92 %-97 %] 96 % (12/28 0630)  Hemodynamic parameters for last 24 hours:    Intake/Output from previous day: 12/27 0701 - 12/28 0700 In: 1300 [I.V.:1200; IV Piggyback:100] Out: 1750 [Urine:1750] Intake/Output this shift: Total I/O In: -  Out: 800 [Urine:800]  General appearance: alert and cooperative Lungs: diminished breath sounds right side  Lab Results:  Basename 12/14/12 0504 12/12/12 0755  WBC 16.0* 19.7*  HGB 10.9* 11.0*  HCT 33.4* 32.8*  PLT 386 352   BMET:  Basename 12/14/12 0504 12/12/12 0755  NA 138 135  K 3.5 3.7  CL 106 103  CO2 24 24  GLUCOSE 132* 115*  BUN 18 25*  CREATININE 0.78 0.97  CALCIUM 8.6 8.7    PT/INR: No results found for this basename: LABPROT,INR in the last 72 hours ABG    Component Value Date/Time   TCO2 22 06/07/2012 2203   CBG (last 3)  No results found for this basename: GLUCAP:3 in the last 72 hours  Assessment/Plan: S/P   -Repeat CXR with decubitus films show reaccumulation of the right pleural effusion and it is to a large degree loculated, as suspected. The best option for treatment is Right VATS, drainage of effusion and decortication under general anesthesia.  I discussed this recommendation with Mr. Ehrler and explained the reasons for it. I also discussed with him the general nature of the procedure, need for general anesthesia, and incisions to be used. I  discussed the expected hospital stay, overall recovery and short and long term outcomes. He understands the risks include but are not limited to death, MI, DVT/PE, bleeding, possible need for transfusion, infections, and other organ system dysfunction including respiratory,  renal, or GI complications. He accepts these risks and agrees to proceed. POA not here presently.  Plan to proceed with Right VATS, drainage of effusion and decortication on Monday 12/30, 2nd case at [facility]. We will need to arrange transport to [facility] Monday AM   LOS: 4 days    HENDRICKSON,STEVEN C 12/14/2012

## 2012-12-14 NOTE — Progress Notes (Signed)
[facility]  Progress Note  HULON FERRON HQI:[id number] DOB: Jun 20, 1935   PCP: Dr. Arville Lime (house calls)   Brief HPI:77 y.o. male with PMH significant for parkinsonism, recent fall with C2 fx, presents to the ER as he slid off his bed and was having right sided CP.He also has bipolar disorder, HTN, chronic indwelling catheter and frequent UTIs. The most recent UTI was with pseudomonas sensitive to Cipro, Cefepime, Zosyn, etc... He admitted to having yellow sputum productive coughs, but no fever or shortness of breath. Work up in the ER included a CXR included a right pleural base consolidation. A f/up CT showed right upper lobe consolidation with moderate pleural effusion (parapneumonic effusion), can't exclude underlying malignancy.  Past Medical History  Diagnosis Date  . Hearing aid worn   . Hypertension   . Parkinson disease   . Pneumonia   . Diabetes mellitus type II   . Arthritis   . Bipolar disorder   . Anxiety   . Depression   . MRSA infection (methicillin-resistant Staphylococcus aureus)   . Chronic indwelling foley catheter   . C2 cervical fracture     due to pt fall  . SIRS (systemic inflammatory response syndrome)   . Neuromuscular disorder     Consultants: None  Procedures: None  Antibiotics:  Vanc and zosyn  Subjective:  Patient complained of chest discomfort while coughing which is better with codeine cough suppresant. He also wants to be on oxygen via nasal canula. No other complaints. No fevers or chills.  Objective: Vital signs in last 24 hours: Filed Vitals:   12/13/12 1829 12/13/12 2200 12/13/12 2300 12/14/12 0630  BP:  175/63 146/74 111/56  Pulse:  59  77  Temp:  98.2 F (36.8 C)  97.6 F (36.4 C)  TempSrc:  Oral  Axillary  Resp:  18    Height:      Weight:      SpO2: 92% 97%  96%   Weight change:   Intake/Output Summary (Last 24 hours) at 12/14/12 0748 Last data filed at 12/14/12 1324  Gross per 24 hour  Intake   1300 ml  Output    1750 ml  Net   -450 ml   BP 111/56  Pulse 77  Temp 97.6 F (36.4 C) (Axillary)  Resp 18  Ht 5\' 7"  (1.702 m)  Wt 151 lb 6.4 oz (68.675 kg)  BMI 23.71 kg/m2  SpO2 96%  General Appearance:    Alert, cooperative, no distress, appears stated age  Head:    Normocephalic, without obvious abnormality, atraumatic  Eyes:    PERRL, conjunctiva/corneas clear, EOM's intact, fundi    benign, both eyes       Ears:    Normal TM's and external ear canals, both ears, hard of hearing  Nose:   Nares normal, septum midline, mucosa normal, no drainage    or sinus tenderness  Throat:   Lips, mucosa, and tongue normal; teeth and gums normal  Neck:   Supple, symmetrical, trachea midline, no adenopathy;       thyroid:  No enlargement/tenderness/nodules; no carotid   bruit or JVD  Back:     Symmetric, no curvature, ROM normal, no CVA tenderness  Lungs:     Decreased breath sounds bilaterally, respirations unlabored, No ronchi, crackles or wheeze  Chest wall:    No tenderness or deformity  Heart:    Regular rate and rhythm, S1 and S2 normal, no murmur, rub   or gallop  Abdomen:  Soft, non-tender, bowel sounds active all four quadrants,    no masses, no organomegaly  Extremities:   Extremities normal, atraumatic, no cyanosis or edema  Pulses:   2+ and symmetric all extremities  Skin:   Skin color, texture, turgor normal, no rashes or lesions  Lymph nodes:   Cervical, supraclavicular, and axillary nodes normal  Neurologic:   CNII-XII intact. Normal strength, sensation and reflexes      throughout   Lab Results: Basic Metabolic Panel:  Lab 12/14/12 1610 12/12/12 0755  NA 138 135  K 3.5 3.7  CL 106 103  CO2 24 24  GLUCOSE 132* 115*  BUN 18 25*  CREATININE 0.78 0.97  CALCIUM 8.6 8.7  MG -- --  PHOS -- --   CBC:  Lab 12/14/12 0504 12/12/12 0755  WBC 16.0* 19.7*  NEUTROABS 12.8* 17.2*  HGB 10.9* 11.0*  HCT 33.4* 32.8*  MCV 86.1 84.8  PLT 386 352     Urinalysis:  Lab 12/10/12 1830    COLORURINE AMBER*  LABSPEC 1.033*  PHURINE 6.0  GLUCOSEU NEGATIVE  HGBUR LARGE*  BILIRUBINUR NEGATIVE  KETONESUR TRACE*  PROTEINUR 100*  UROBILINOGEN 0.2  NITRITE POSITIVE*  LEUKOCYTESUR LARGE*     Micro Results: Recent Results (from the past 240 hour(s))  URINE CULTURE     Status: Normal   Collection Time   12/10/12  6:30 PM      Component Value Range Status Comment   Specimen Description URINE, CLEAN CATCH   Final    Special Requests NONE   Final    Culture  Setup Time 12/11/2012 02:29   Final    Colony Count >=100,000 COLONIES/ML   Final    Culture     Final    Value: METHICILLIN RESISTANT STAPHYLOCOCCUS AUREUS     27 Note: RIFAMPIN AND GENTAMICIN SHOULD NOT BE USED AS SINGLE DRUGS FOR TREATMENT OF STAPH INFECTIONS. CRITICAL RESULT CALLED TO, READ BACK BY AND VERIFIED WITH: CYNTHIA WHITE ON 12 13 @ 1240 BY FERGK   Report Status 12/13/2012 FINAL   Final    Organism ID, Bacteria METHICILLIN RESISTANT STAPHYLOCOCCUS AUREUS   Final   MRSA PCR SCREENING     Status: Normal   Collection Time   12/10/12 10:53 PM      Component Value Range Status Comment   MRSA by PCR NEGATIVE  NEGATIVE Final   CULTURE, BLOOD (ROUTINE X 2)     Status: Normal (Preliminary result)   Collection Time   12/11/12  2:00 PM      Component Value Range Status Comment   Specimen Description BLOOD RIGHT ARM   Final    Special Requests BOTTLES DRAWN AEROBIC AND ANAEROBIC 6CC   Final    Culture  Setup Time 12/12/2012 00:25   Final    Culture     Final    Value:        BLOOD CULTURE RECEIVED NO GROWTH TO DATE CULTURE WILL BE HELD FOR 5 DAYS BEFORE ISSUING A FINAL NEGATIVE REPORT   Report Status PENDING   Incomplete   CULTURE, BLOOD (ROUTINE X 2)     Status: Normal (Preliminary result)   Collection Time   12/11/12  2:07 PM      Component Value Range Status Comment   Specimen Description BLOOD RIGHT ARM   Final    Special Requests BOTTLES DRAWN AEROBIC AND ANAEROBIC 10CC   Final    Culture  Setup Time  12/12/2012 00:25   Final  Culture     Final    Value:        BLOOD CULTURE RECEIVED NO GROWTH TO DATE CULTURE WILL BE HELD FOR 5 DAYS BEFORE ISSUING A FINAL NEGATIVE REPORT   Report Status PENDING   Incomplete   BODY FLUID CULTURE     Status: Normal (Preliminary result)   Collection Time   12/12/12  2:22 PM      Component Value Range Status Comment   Specimen Description PLEURAL   Final    Special Requests Normal   Final    Gram Stain     Final    Value: RARE WBC PRESENT,BOTH PMN AND MONONUCLEAR     NO ORGANISMS SEEN   Culture NO GROWTH   Final    Report Status PENDING   Incomplete    Studies/Results: Dg Chest 1 View  12/12/2012  *RADIOLOGY REPORT*  Clinical Data: Status post thoracentesis.  CHEST - 1 VIEW  Comparison: Chest x-ray 12/10/2012.  Findings: Irregular areas of interstitial prominence and air space consolidation throughout the right lung with a peripherally loculated moderate - large right-sided pleural effusion which appears similar to slightly increased compared to prior study from 12/10/2012.  Left lung is clear. No pneumothorax.  Pulmonary vasculature is within normal limits.  Heart size is mildly enlarged.  Upper mediastinal contours are unremarkable. Atherosclerosis in the thoracic aorta. Multiple old healed left- sided rib fractures are again noted.  IMPRESSION: 1.  Multifocal interstitial and airspace disease throughout the right lung, concerning for multilobar pneumonia (most severe in the right upper lobe). 2.  Moderate - large partially loculated right-sided pleural effusion with passive atelectasis in the right lung base. 3.  Mild cardiomegaly. 4.  Atherosclerosis.   Original Report Authenticated By: Trudie Reed, M.D.    US Thoracentesis Asp Pleural Space W/img Guide  12/12/2012  *RADIOLOGY REPORT*  Clinical Data:  Pneumonia with right pleural effusion.  Request has been made for therapeutic and diagnostic right-sided thoracentesis.  ULTRASOUND GUIDED right  THORACENTESIS  Comparison:  Prior chest imaging  An ultrasound guided thoracentesis was thoroughly discussed with the patient and questions answered.  The benefits, risks, alternatives and complications were also discussed.  The patient understands and wishes to proceed with the procedure.  Written consent was obtained.  Ultrasound was performed to localize and mark an adequate pocket of fluid in the right chest.  The area was then prepped and draped in the normal sterile fashion.  1% Lidocaine was used for local anesthesia.  Under ultrasound guidance a 19 gauge Yueh catheter was introduced.  Thoracentesis was performed.  The catheter was removed and a dressing applied.  Complications:  None immediate  Findings: A total of approximately 870 ml of amber-colored fluid was removed. A fluid sample was sent for laboratory analysis.  IMPRESSION: Successful ultrasound guided right thoracentesis yielding 870 ml of pleural fluid.  Read by: Anselm Pancoast, P.A.-C *RADIOLOGY REPORT*  Clinical Data:  Shortness of breath with increased pleural effusion.  Request has been made for diagnostic and therapeutic right-sided thoracentesis.  ULTRASOUND GUIDED right THORACENTESIS  Comparison:  Prior chest imaging  An ultrasound guided thoracentesis was thoroughly discussed with the patient and questions answered.  The benefits, risks, alternatives and complications were also discussed.  The patient understands and wishes to proceed with the procedure.  Written consent was obtained.  Ultrasound was performed to localize and mark an adequate pocket of fluid in the right chest.  The area was then prepped and draped in  the normal sterile fashion.  1% Lidocaine was used for local anesthesia.  Under ultrasound guidance a 19 gauge Yueh catheter was introduced.  Thoracentesis was performed.  The catheter was removed and a dressing applied.  Complications:  None immediate  Findings: A total of approximately 870 ml of amber tinged serous fluid  was removed. A fluid sample was sent for laboratory analysis.  IMPRESSION: Successful ultrasound guided right thoracentesis yielding 870 ml of pleural fluid.  Read by: Anselm Pancoast, P.A.-C   Original Report Authenticated By: Richarda Overlie, M.D.    Medications: I have reviewed the patient's current medications. Scheduled Meds:    . aspirin EC  81 mg Oral q morning - 10a  . carbidopa-levodopa  1 tablet Oral TID  . entacapone  200 mg Oral TID  . lamoTRIgine  150 mg Oral BID  . multivitamin with minerals  1 tablet Oral q morning - 10a  . neomycin-bacitracin-polymyxin  1 application Topical Daily  . pantoprazole  40 mg Oral Daily  . piperacillin-tazobactam  3.375 g Intravenous Q8H  . QUEtiapine  50 mg Oral QHS  . sertraline  150 mg Oral q morning - 10a  . vancomycin  1,000 mg Intravenous Q12H   Continuous Infusions:    . sodium chloride 75 mL/hr at 12/13/12 2141   PRN Meds:.clonazePAM, docusate sodium, guaiFENesin-codeine, ondansetron (ZOFRAN) IV, ondansetron, traZODone Assessment/Plan: Patient Active Hospital Problem List: HCAP (healthcare-associated pneumonia) Bipolar 1 disorder  Parkinson disease Mental status, decreased  Urinary tract infection Hypertension Diabetes mellitus, type 2  Chronic indwelling foley catheter  Parapneumonic effusion   Parapneumonic effusion/empyema: s/p ultrasound guided thoracentesis on 12/26. Results of pleural fluid show pH-7.00, elevated WBC with neutrophil predominance and elevated LDH up to 1386 and pH. As per Light's criteria, pleural fluid protein/serum protein ratio is greater than 0.5 and pleural LDH is more than 2/3 rd of upper limit of normal for serum LDH, which favors exudative effusion. With glucose <20 , differentials include empyema vs malignancy vs RA vs TB. He has no signs of RA or TB .Given the elevated white count and neutrophilic predominance, I think the effusion is likely consistent with empyema. He had low grade temp of 99.1 last  night . Also, he continues to have elevated white count to 19.7. -Continue vanc and zosyn for now - Repeat CT in 8 weeks to follow up -Appreciate TCT consult. -Oxygen via nasal canula -Patient to undergo bilateral decubitus and 2 view CXR before deciding on the best course of action for his situation as per TCT. -CBC with diff and BMP in AM   Chronic Indwelling foley's catheter: Though , his UA was positive for nitrites and leucocytes with too numerous to count WBC's. Urine culture shows 100,000 colonies of staph aureus but he is ASYMPTOMATIC and has chronic indwelling catheter. Currentu on vancomycin anyways which should cover MRSA.  Parkinsonism: Stable . Would continue Sinmet and comtan (entacapone)  Bipolar disease: Stable. Would continue lamictal, seroquel, zoloft and trazadone  HTN: BP very well controlled.   DVT: SCD's  Dispo: Patient is at assisted living facility and would wanna go home from here.  Code: Full  Family discussion: none     LOS: 4 days   Lars Mage 12/14/2012, 7:48 AM [facility]  Pager [phone]   If 8PM-8AM, please contact night-coverage at [email or web address], password [facility]

## 2012-12-15 DIAGNOSIS — N179 Acute kidney failure, unspecified: Secondary | ICD-10-CM

## 2012-12-15 DIAGNOSIS — F411 Generalized anxiety disorder: Secondary | ICD-10-CM

## 2012-12-15 LAB — BLOOD GAS, ARTERIAL
Drawn by: 103701
FIO2: 0.21 %
pCO2 arterial: 35.6 mmHg (ref 35.0–45.0)
pO2, Arterial: 52.1 mmHg — ABNORMAL LOW (ref 80.0–100.0)

## 2012-12-15 LAB — PROTIME-INR
INR: 1.25 (ref 0.00–1.49)
Prothrombin Time: 15.5 seconds — ABNORMAL HIGH (ref 11.6–15.2)

## 2012-12-15 LAB — URINE MICROSCOPIC-ADD ON

## 2012-12-15 LAB — CBC WITH DIFFERENTIAL/PLATELET
Basophils Absolute: 0 10*3/uL (ref 0.0–0.1)
Eosinophils Relative: 4 % (ref 0–5)
HCT: 32.5 % — ABNORMAL LOW (ref 39.0–52.0)
Lymphocytes Relative: 7 % — ABNORMAL LOW (ref 12–46)
MCH: 27.9 pg (ref 26.0–34.0)
MCHC: 32.3 g/dL (ref 30.0–36.0)
MCV: 86.4 fL (ref 78.0–100.0)
Monocytes Absolute: 1.3 10*3/uL — ABNORMAL HIGH (ref 0.1–1.0)
RDW: 14.9 % (ref 11.5–15.5)
WBC: 17.6 10*3/uL — ABNORMAL HIGH (ref 4.0–10.5)

## 2012-12-15 LAB — BASIC METABOLIC PANEL
CO2: 24 mEq/L (ref 19–32)
Calcium: 8.3 mg/dL — ABNORMAL LOW (ref 8.4–10.5)
Creatinine, Ser: 0.83 mg/dL (ref 0.50–1.35)

## 2012-12-15 LAB — COMPREHENSIVE METABOLIC PANEL
Albumin: 2 g/dL — ABNORMAL LOW (ref 3.5–5.2)
BUN: 14 mg/dL (ref 6–23)
Calcium: 8.4 mg/dL (ref 8.4–10.5)
Creatinine, Ser: 0.98 mg/dL (ref 0.50–1.35)
Total Protein: 5.1 g/dL — ABNORMAL LOW (ref 6.0–8.3)

## 2012-12-15 LAB — BODY FLUID CULTURE
Culture: NO GROWTH
Special Requests: NORMAL

## 2012-12-15 LAB — CBC
HCT: 34.6 % — ABNORMAL LOW (ref 39.0–52.0)
MCHC: 32.9 g/dL (ref 30.0–36.0)
MCV: 85.6 fL (ref 78.0–100.0)
RDW: 14.7 % (ref 11.5–15.5)

## 2012-12-15 LAB — URINALYSIS, ROUTINE W REFLEX MICROSCOPIC
Bilirubin Urine: NEGATIVE
Glucose, UA: NEGATIVE mg/dL
Hgb urine dipstick: NEGATIVE
Specific Gravity, Urine: 1.016 (ref 1.005–1.030)
Urobilinogen, UA: 0.2 mg/dL (ref 0.0–1.0)

## 2012-12-15 LAB — GLUCOSE, CAPILLARY: Glucose-Capillary: 118 mg/dL — ABNORMAL HIGH (ref 70–99)

## 2012-12-15 LAB — VANCOMYCIN, TROUGH: Vancomycin Tr: 17 ug/mL (ref 10.0–20.0)

## 2012-12-15 MED ORDER — INSULIN ASPART 100 UNIT/ML ~~LOC~~ SOLN: 0.0000 [IU] | Freq: Every day | SUBCUTANEOUS | Status: DC

## 2012-12-15 MED ORDER — POTASSIUM CHLORIDE 10 MEQ/100ML IV SOLN
10.0000 meq | INTRAVENOUS | Status: AC
  Administered 2012-12-15 (×4): 10 meq via INTRAVENOUS
  Filled 2012-12-15 (×4): qty 100

## 2012-12-15 MED ORDER — INSULIN ASPART 100 UNIT/ML ~~LOC~~ SOLN: 0.0000 [IU] | Freq: Two times a day (BID) | SUBCUTANEOUS | Status: DC

## 2012-12-15 NOTE — Progress Notes (Signed)
  Subjective: C/o frequent cough with thick sputum  Objective: Vital signs in last 24 hours: Temp:  [97.6 F (36.4 C)-98.3 F (36.8 C)] 97.7 F (36.5 C) (12/29 0628) Pulse Rate:  [55-58] 55  (12/29 0628) Cardiac Rhythm:  [-]  Resp:  [18] 18  (12/29 0628) BP: (131-152)/(67-79) 131/79 mmHg (12/29 0628) SpO2:  [97 %] 97 % (12/29 0628)  Hemodynamic parameters for last 24 hours:    Intake/Output from previous day: 12/28 0701 - 12/29 0700 In: 2390 [I.V.:1640; IV Piggyback:750] Out: 2350 [Urine:2350] Intake/Output this shift:    General appearance: alert and no distress Lungs: rhonchi and wheezes R upper, no BS R lower  Lab Results:  Basename 12/15/12 0517 12/14/12 0504  WBC 17.6* 16.0*  HGB 10.5* 10.9*  HCT 32.5* 33.4*  PLT 395 386   BMET:  Basename 12/15/12 0517 12/14/12 0504  NA 139 138  K 3.5 3.5  CL 105 106  CO2 24 24  GLUCOSE 107* 132*  BUN 13 18  CREATININE 0.83 0.78  CALCIUM 8.3* 8.6    PT/INR: No results found for this basename: LABPROT,INR in the last 72 hours ABG    Component Value Date/Time   TCO2 22 06/07/2012 2203   CBG (last 3)  No results found for this basename: GLUCAP:3 in the last 72 hours  Assessment/Plan: S/P   Right parapneumonic effusion/ empyema For Bronch, Right VATS, drainage of empyema and decortication tomorrow afternoon I once again reviewed the procedure and recovery with him. We discussed the risks/ benefits in detail yesterday.  He wishes to proceed.   LOS: 5 days    Faatima Tench C 12/15/2012   

## 2012-12-15 NOTE — Progress Notes (Signed)
ANTIBIOTIC CONSULT NOTE - FOLLOW UP  Pharmacy Consult for Vancomycin Indication: HCAP and UTI  Allergies  Allergen Reactions  . Cholestatin Other (See Comments)    Per [facility]   Patient Measurements: Height: 5\' 7"  (170.2 cm) Weight: 151 lb 6.4 oz (68.675 kg) IBW/kg (Calculated) : 66.1   Vital Signs: Temp: 97.7 F (36.5 C) (12/29 0628) Temp src: Oral (12/29 0628) BP: 131/79 mmHg (12/29 0628) Pulse Rate: 55  (12/29 0628) Intake/Output from previous day: 12/28 0701 - 12/29 0700 In: 2390 [I.V.:1640; IV Piggyback:750] Out: 2350 [Urine:2350] Intake/Output from this shift:    Labs:  [facility] 12/15/12 0517 12/14/12 0504  WBC 17.6* 16.0*  HGB 10.5* 10.9*  PLT 395 386  LABCREA -- --  CREATININE 0.83 0.78   Estimated Creatinine Clearance: 71.9 ml/min (by C-G formula based on Cr of 0.83). No results found for this basename: VANCOTROUGH:2,VANCOPEAK:2,VANCORANDOM:2,GENTTROUGH:2,GENTPEAK:2,GENTRANDOM:2,TOBRATROUGH:2,TOBRAPEAK:2,TOBRARND:2,AMIKACINPEAK:2,AMIKACINTROU:2,AMIKACIN:2, in the last 72 hours   Microbiology: Recent Results (from the past 720 hour(s))  URINE CULTURE     Status: Normal   Collection Time   12/10/12  6:30 PM      Component Value Range Status Comment   Specimen Description URINE, CLEAN CATCH   Final    Special Requests NONE   Final    Culture  Setup Time 12/11/2012 02:29   Final    Colony Count >=100,000 COLONIES/ML   Final    Culture     Final    Value: METHICILLIN RESISTANT STAPHYLOCOCCUS AUREUS     27 Note: RIFAMPIN AND GENTAMICIN SHOULD NOT BE USED AS SINGLE DRUGS FOR TREATMENT OF STAPH INFECTIONS. CRITICAL RESULT CALLED TO, READ BACK BY AND VERIFIED WITH: CYNTHIA WHITE ON 12 13 @ 1240 BY FERGK   Report Status 12/13/2012 FINAL   Final    Organism ID, Bacteria METHICILLIN RESISTANT STAPHYLOCOCCUS AUREUS   Final   MRSA PCR SCREENING     Status: Normal   Collection Time   12/10/12 10:53 PM      Component Value Range Status Comment   MRSA by PCR NEGATIVE   NEGATIVE Final   CULTURE, BLOOD (ROUTINE X 2)     Status: Normal (Preliminary result)   Collection Time   12/11/12  2:00 PM      Component Value Range Status Comment   Specimen Description BLOOD RIGHT ARM   Final    Special Requests BOTTLES DRAWN AEROBIC AND ANAEROBIC 6CC   Final    Culture  Setup Time 12/12/2012 00:25   Final    Culture     Final    Value:        BLOOD CULTURE RECEIVED NO GROWTH TO DATE CULTURE WILL BE HELD FOR 5 DAYS BEFORE ISSUING A FINAL NEGATIVE REPORT   Report Status PENDING   Incomplete   CULTURE, BLOOD (ROUTINE X 2)     Status: Normal (Preliminary result)   Collection Time   12/11/12  2:07 PM      Component Value Range Status Comment   Specimen Description BLOOD RIGHT ARM   Final    Special Requests BOTTLES DRAWN AEROBIC AND ANAEROBIC 10CC   Final    Culture  Setup Time 12/12/2012 00:25   Final    Culture     Final    Value:        BLOOD CULTURE RECEIVED NO GROWTH TO DATE CULTURE WILL BE HELD FOR 5 DAYS BEFORE ISSUING A FINAL NEGATIVE REPORT   Report Status PENDING   Incomplete   BODY FLUID CULTURE  Status: Normal (Preliminary result)   Collection Time   12/12/12  2:22 PM      Component Value Range Status Comment   Specimen Description PLEURAL   Final    Special Requests Normal   Final    Gram Stain     Final    Value: RARE WBC PRESENT,BOTH PMN AND MONONUCLEAR     NO ORGANISMS SEEN   Culture NO GROWTH 2 DAYS   Final    Report Status PENDING   Incomplete     Anti-infectives     Start     Dose/Rate Route Frequency Ordered Stop   12/10/12 2200  piperacillin-tazobactam (ZOSYN) IVPB 3.375 g       3.375 g 12.5 mL/hr over 240 Minutes Intravenous 3 times per day 12/10/12 2139     12/10/12 2200   vancomycin (VANCOCIN) IVPB 1000 mg/200 mL premix        1,000 mg 200 mL/hr over 60 Minutes Intravenous Every 12 hours 12/10/12 2147     12/10/12 1945   cefTRIAXone (ROCEPHIN) 1 g in dextrose 5 % 50 mL IVPB  Status:  Discontinued        1 g 100 mL/hr over 30  Minutes Intravenous Every 24 hours 12/10/12 1937 12/10/12 2139   12/10/12 1945   azithromycin (ZITHROMAX) tablet 500 mg        500 mg Oral  Once 12/10/12 1937 12/10/12 2019         Assessment: 77 yoM with HCAP and UTI. Thoracentesis 12/26 for R pleural effusion (870 ml removed), reaccumulation of loculated effusion/empyema. Plan VATS 12/30 at [facility].  D6 of Vancomycin/Zosyn. Vanc trough 12/26 slightly low, expected accumulation so no dose change.  Cultures: blood-no growth. Urine with MRSA. Pleural fluid-no organisms on gram stain.  BUN, SCr decreasing, will recheck Vanc level tonight.  Goal of Therapy:  Vancomycin trough level 15-20 mcg/ml  Plan:  Continue Vancomycin 1gm q12 Vanc trough recheck tonight  Otho Bellows PharmD Pager [phone] 12/15/2012,10:00 AM

## 2012-12-15 NOTE — Progress Notes (Signed)
Patient ID: [name], male   DOB: 25-Jan-1935, 77 y.o.   MRN: [id number]  [facility] PROGRESS NOTE  [name] WUJ:[id number] DOB: 12-22-1934 DOA: 12/10/2012 PCP: [name], MD  Brief narrative: Pt is 77 yo male with parkinsonism, HTN, bipolar disorder, chronic indwelling cath and frequent UTI's, recent fall with C2 fx, who presented to the ER 12/24 as he slid off his bed and has suffered subsequent right sides chest pain.  The most recent UTI was with pseudomonas sensitive to Cipro, Cefepime, Zosyn. He admitted to having yellow sputum productive coughs, but no fever or shortness of breath. Work up in the ER included CXR and has indicated right pleural base consolidation. A f/up CT showed right upper lobe consolidation with moderate pleural effusion (parapneumonic effusion).  Principal Problem:  * Right Parapneumonic Effusion/ Empyema  - plan for bronch, Right VATS, drainage of empyema and decortication tomorrow afternoon  - TCTS following, appreciate input - continue broad spectrum ABX, Vancomycin and Zosyn day#6 - plan on d/c IVF Active Problems:  Bipolar 1 disorder - stable, continue home medication regimen   Parkinson disease - stable, continue home medication regimen  Urinary tract infection - possibly related to chronic colonization as pt is asymptomatic - Vancomycin should cover in case   Hypertension - reasonably well controlled  Diabetes mellitus, type 2 - CBG in 170 this AM - will place on SSI only at this time and sensitive coverage  Chronic indwelling foley catheter - keep in place  Consultants:  TCTS  Procedures/Studies: Dg Chest 2 View: 12/14/2012  --> Enlarging right effusion which is at least partially loculated.  Diffuse right lung airspace disease, slightly worsened.    Dg Chest Bilateral Decubitus 12/14/2012  --> Right effusion appears predominately loculated with possible small free flowing component.  Ultrasound guided thoracentesis  12/26  Antibiotics/Micro:  Vancomycin 12/24 -->  Zosyn 12/24 -->  Urine culture 12/24 --> MRSA +  MRSA PCR 12/24 --> +  Blood Culture 12/25 --> No growth to date  Pleural fluid culture 12/26 --> No growth to date   Code Status: Full Family Communication: Pt at bedside Disposition Plan: Not ready for discharge   HPI/Subjective: No events overnight. Pt reports productive cough, thick and yellow sputum.   Objective: Filed Vitals:   12/14/12 0630 12/14/12 1352 12/14/12 2219 12/15/12 0628  BP: 111/56 152/67 149/78 131/79  Pulse: 77 58 58 55  Temp: 97.6 F (36.4 C) 97.6 F (36.4 C) 98.3 F (36.8 C) 97.7 F (36.5 C)  TempSrc: Axillary Oral Oral Oral  Resp:  18 18 18   Height:      Weight:      SpO2: 96% 97% 97% 97%    Intake/Output Summary (Last 24 hours) at 12/15/12 1255 Last data filed at 12/15/12 0630  Gross per 24 hour  Intake   1490 ml  Output   1550 ml  Net    -60 ml    Exam:   General:  Pt is alert, follows commands appropriately, not in acute distress  Cardiovascular: Regular rate and rhythm, S1/S2, no murmurs, no rubs, no gallops  Respiratory: Decreased breath sounds on the right side, rhonchi in upper lobes   Abdomen: Soft, non tender, non distended, bowel sounds present, no guarding  Extremities: No edema, pulses DP and PT palpable bilaterally  Neuro: Grossly nonfocal  Data Reviewed: Basic Metabolic Panel:  Lab 12/15/12 9562 12/15/12 0517 12/14/12 0504 12/12/12 0755 12/11/12 1215  NA 138 139 138 135 135  K  3.4* 3.5 3.5 3.7 3.8  CL 104 105 106 103 101  CO2 27 24 24 24 26   GLUCOSE 174* 107* 132* 115* 111*  BUN 14 13 18  25* 24*  CREATININE 0.98 0.83 0.78 0.97 1.09  CALCIUM 8.4 8.3* 8.6 8.7 8.7  MG -- -- -- -- --  PHOS -- -- -- -- --   Liver Function Tests:  Lab 12/15/12 1137 12/14/12 0504 12/11/12 1215  AST 6 10 6   ALT 5 <5 <5  ALKPHOS 71 73 76  BILITOT 0.2* 0.3 0.2*  PROT 5.1* 5.0* 5.2*  ALBUMIN 2.0* 2.0* 2.2*   CBC:  Lab  12/15/12 1137 12/15/12 0517 12/14/12 0504 12/12/12 0755 12/11/12 1215 12/10/12 1640  WBC 20.3* 17.6* 16.0* 19.7* 24.4* --  NEUTROABS -- 14.3* 12.8* 17.2* 22.2* 20.0*  HGB 11.4* 10.5* 10.9* 11.0* 12.0* --  HCT 34.6* 32.5* 33.4* 32.8* 35.0* --  MCV 85.6 86.4 86.1 84.8 84.7 --  PLT 470* 395 386 352 347 --    Scheduled Meds:   . aspirin EC  81 mg Oral q morning - 10a  . carbidopa-levodopa  1 tablet Oral TID  . entacapone  200 mg Oral TID  . lamoTRIgine  150 mg Oral BID  . multivitamin  1 tablet Oral q morning - 10a  . pantoprazole  40 mg Oral Daily  . piperacillin-tazobactam  3.375 g Intravenous Q8H  . QUEtiapine  50 mg Oral QHS  . sertraline  150 mg Oral q morning - 10a  . vancomycin  1,000 mg Intravenous Q12H   Continuous Infusions:   . sodium chloride 75 mL/hr at 12/15/12 0305     Debbora Presto, MD  [facility] Pager [phone]  If 7PM-7AM, please contact night-coverage [email or web address] Password [email or web address] 12/15/2012, 12:55 PM   LOS: 5 days

## 2012-12-16 ENCOUNTER — Encounter (HOSPITAL_COMMUNITY): Payer: Self-pay | Admitting: Surgery

## 2012-12-16 ENCOUNTER — Encounter (HOSPITAL_COMMUNITY): Payer: Self-pay | Admitting: Certified Registered"

## 2012-12-16 ENCOUNTER — Inpatient Hospital Stay (HOSPITAL_COMMUNITY): Payer: Medicare Other

## 2012-12-16 ENCOUNTER — Inpatient Hospital Stay (HOSPITAL_COMMUNITY): Payer: Medicare Other | Admitting: Certified Registered"

## 2012-12-16 ENCOUNTER — Encounter (HOSPITAL_COMMUNITY)
Admission: EM | Disposition: A | Discharge status: Rehabilitation Facility | Payer: Self-pay | Source: Home / Self Care | Attending: Thoracic Surgery (Cardiothoracic Vascular Surgery)

## 2012-12-16 DIAGNOSIS — J869 Pyothorax without fistula: Secondary | ICD-10-CM

## 2012-12-16 HISTORY — PX: VIDEO BRONCHOSCOPY: SHX5072

## 2012-12-16 HISTORY — PX: VIDEO ASSISTED THORACOSCOPY (VATS)/EMPYEMA: SHX6172

## 2012-12-16 LAB — GLUCOSE, CAPILLARY
Glucose-Capillary: 114 mg/dL — ABNORMAL HIGH (ref 70–99)
Glucose-Capillary: 97 mg/dL (ref 70–99)
Glucose-Capillary: 136 mg/dL — ABNORMAL HIGH (ref 70–99)

## 2012-12-16 LAB — TYPE AND SCREEN
Antibody Screen: POSITIVE
DAT, IgG: NEGATIVE
PT AG Type: NEGATIVE

## 2012-12-16 LAB — BASIC METABOLIC PANEL
Calcium: 8.6 mg/dL (ref 8.4–10.5)
Chloride: 105 mEq/L (ref 96–112)
Creatinine, Ser: 1.14 mg/dL (ref 0.50–1.35)
GFR calc Af Amer: 71 mL/min — ABNORMAL LOW (ref >90)
GFR calc non Af Amer: 61 mL/min — ABNORMAL LOW (ref >90)

## 2012-12-16 LAB — CBC
MCHC: 31.9 g/dL (ref 30.0–36.0)
MCV: 87 fL (ref 78.0–100.0)
Platelets: 434 10*3/uL — ABNORMAL HIGH (ref 150–400)
RDW: 15 % (ref 11.5–15.5)
WBC: 22.6 10*3/uL — ABNORMAL HIGH (ref 4.0–10.5)

## 2012-12-16 SURGERY — BRONCHOSCOPY, VIDEO-ASSISTED
Anesthesia: General | Site: Chest | Laterality: Right

## 2012-12-16 MED ORDER — ALBUTEROL SULFATE HFA 108 (90 BASE) MCG/ACT IN AERS
4.0000 | INHALATION_SPRAY | Freq: Four times a day (QID) | RESPIRATORY_TRACT | Status: AC
  Administered 2012-12-17 – 2012-12-18 (×6): 4 via RESPIRATORY_TRACT
  Filled 2012-12-16: qty 6.7

## 2012-12-16 MED ORDER — OXYCODONE-ACETAMINOPHEN 5-325 MG PO TABS
1.0000 | ORAL_TABLET | ORAL | Status: DC | PRN
  Administered 2012-12-22: 2 via ORAL
  Filled 2012-12-16: qty 2

## 2012-12-16 MED ORDER — 0.9 % SODIUM CHLORIDE (POUR BTL) OPTIME
TOPICAL | Status: DC | PRN
  Administered 2012-12-16: 2000 mL

## 2012-12-16 MED ORDER — GLYCOPYRROLATE 0.2 MG/ML IJ SOLN
INTRAMUSCULAR | Status: DC | PRN
  Administered 2012-12-16: 0.4 mg via INTRAVENOUS
  Administered 2012-12-16: 0.2 mg via INTRAVENOUS

## 2012-12-16 MED ORDER — FENTANYL CITRATE 0.05 MG/ML IJ SOLN
INTRAMUSCULAR | Status: AC
  Filled 2012-12-16: qty 2

## 2012-12-16 MED ORDER — ACETAMINOPHEN 10 MG/ML IV SOLN
1000.0000 mg | Freq: Four times a day (QID) | INTRAVENOUS | Status: AC
  Administered 2012-12-16 – 2012-12-17 (×3): 1000 mg via INTRAVENOUS
  Filled 2012-12-16 (×3): qty 100

## 2012-12-16 MED ORDER — ONDANSETRON HCL 4 MG/2ML IJ SOLN: 4.0000 mg | Freq: Four times a day (QID) | INTRAMUSCULAR | Status: DC | PRN

## 2012-12-16 MED ORDER — KCL IN DEXTROSE-NACL 20-5-0.45 MEQ/L-%-% IV SOLN
INTRAVENOUS | Status: AC
  Filled 2012-12-16: qty 1000

## 2012-12-16 MED ORDER — ROCURONIUM BROMIDE 100 MG/10ML IV SOLN
INTRAVENOUS | Status: DC | PRN
  Administered 2012-12-16: 50 mg via INTRAVENOUS
  Administered 2012-12-16: 10 mg via INTRAVENOUS
  Administered 2012-12-16: 20 mg via INTRAVENOUS

## 2012-12-16 MED ORDER — EPINEPHRINE HCL 0.1 MG/ML IJ SOLN
INTRAMUSCULAR | Status: DC | PRN
  Administered 2012-12-16: 0.2 mg via INTRAVENOUS
  Administered 2012-12-16: 0.3 mg via INTRAVENOUS

## 2012-12-16 MED ORDER — PROPOFOL 10 MG/ML IV BOLUS
INTRAVENOUS | Status: DC | PRN
  Administered 2012-12-16: 180 mg via INTRAVENOUS

## 2012-12-16 MED ORDER — DIPHENHYDRAMINE HCL 50 MG/ML IJ SOLN
12.5000 mg | Freq: Four times a day (QID) | INTRAMUSCULAR | Status: DC | PRN
  Filled 2012-12-16: qty 0.25

## 2012-12-16 MED ORDER — PHENYLEPHRINE HCL 10 MG/ML IJ SOLN
INTRAMUSCULAR | Status: DC | PRN
  Administered 2012-12-16 (×9): 80 ug via INTRAVENOUS

## 2012-12-16 MED ORDER — MIDAZOLAM HCL 2 MG/2ML IJ SOLN
INTRAMUSCULAR | Status: AC
Start: 2012-12-16 — End: 2012-12-16
  Filled 2012-12-16: qty 2

## 2012-12-16 MED ORDER — MIDAZOLAM HCL 5 MG/5ML IJ SOLN
INTRAMUSCULAR | Status: DC | PRN
  Administered 2012-12-16: 1 mg via INTRAVENOUS

## 2012-12-16 MED ORDER — OXYCODONE HCL 5 MG PO TABS: 5.0000 mg | ORAL_TABLET | ORAL | Status: AC | PRN

## 2012-12-16 MED ORDER — BISACODYL 5 MG PO TBEC
10.0000 mg | DELAYED_RELEASE_TABLET | Freq: Every day | ORAL | Status: DC
  Administered 2012-12-16 – 2012-12-23 (×8): 10 mg via ORAL
  Filled 2012-12-16 (×9): qty 2

## 2012-12-16 MED ORDER — MORPHINE SULFATE 2 MG/ML IJ SOLN: 1.0000 mg | INTRAMUSCULAR | Status: DC | PRN

## 2012-12-16 MED ORDER — TRAMADOL HCL 50 MG PO TABS
50.0000 mg | ORAL_TABLET | Freq: Four times a day (QID) | ORAL | Status: DC | PRN
  Administered 2012-12-22: 100 mg via ORAL
  Filled 2012-12-16: qty 2

## 2012-12-16 MED ORDER — NEOSTIGMINE METHYLSULFATE 1 MG/ML IJ SOLN
INTRAMUSCULAR | Status: DC | PRN
  Administered 2012-12-16: 3 mg via INTRAVENOUS

## 2012-12-16 MED ORDER — SENNOSIDES-DOCUSATE SODIUM 8.6-50 MG PO TABS
1.0000 | ORAL_TABLET | Freq: Every evening | ORAL | Status: DC | PRN
  Filled 2012-12-16: qty 1

## 2012-12-16 MED ORDER — ONDANSETRON HCL 4 MG/2ML IJ SOLN
4.0000 mg | Freq: Four times a day (QID) | INTRAMUSCULAR | Status: DC | PRN
  Filled 2012-12-16: qty 2

## 2012-12-16 MED ORDER — NALOXONE HCL 0.4 MG/ML IJ SOLN
0.4000 mg | INTRAMUSCULAR | Status: DC | PRN
  Filled 2012-12-16: qty 1

## 2012-12-16 MED ORDER — METOCLOPRAMIDE HCL 5 MG/ML IJ SOLN: 10.0000 mg | Freq: Once | INTRAMUSCULAR | Status: DC | PRN

## 2012-12-16 MED ORDER — LACTATED RINGERS IV SOLN
INTRAVENOUS | Status: DC
  Administered 2012-12-16 (×2): via INTRAVENOUS

## 2012-12-16 MED ORDER — ONDANSETRON HCL 4 MG/2ML IJ SOLN
INTRAMUSCULAR | Status: DC | PRN
  Administered 2012-12-16: 4 mg via INTRAVENOUS

## 2012-12-16 MED ORDER — FENTANYL 10 MCG/ML IV SOLN
INTRAVENOUS | Status: DC
  Administered 2012-12-16: 10 ug via INTRAVENOUS
  Administered 2012-12-17 (×2): 20 ug via INTRAVENOUS
  Administered 2012-12-17: 10 ug via INTRAVENOUS
  Administered 2012-12-17: 40 ug via INTRAVENOUS
  Administered 2012-12-18: 10 ug via INTRAVENOUS
  Administered 2012-12-18: 40 ug via INTRAVENOUS
  Filled 2012-12-16 (×2): qty 50

## 2012-12-16 MED ORDER — POTASSIUM CHLORIDE 10 MEQ/50ML IV SOLN
10.0000 meq | Freq: Every day | INTRAVENOUS | Status: DC | PRN
  Administered 2012-12-18: 10 meq via INTRAVENOUS
  Filled 2012-12-16: qty 50

## 2012-12-16 MED ORDER — SODIUM CHLORIDE 0.9 % IJ SOLN: 9.0000 mL | INTRAMUSCULAR | Status: DC | PRN

## 2012-12-16 MED ORDER — FENTANYL CITRATE 0.05 MG/ML IJ SOLN
INTRAMUSCULAR | Status: DC | PRN
  Administered 2012-12-16 (×2): 100 ug via INTRAVENOUS
  Administered 2012-12-16: 50 ug via INTRAVENOUS

## 2012-12-16 MED ORDER — DIPHENHYDRAMINE HCL 12.5 MG/5ML PO ELIX
12.5000 mg | ORAL_SOLUTION | Freq: Four times a day (QID) | ORAL | Status: DC | PRN
  Filled 2012-12-16: qty 5

## 2012-12-16 MED ORDER — KCL IN DEXTROSE-NACL 20-5-0.45 MEQ/L-%-% IV SOLN
INTRAVENOUS | Status: DC
  Administered 2012-12-16: 100 mL via INTRAVENOUS
  Administered 2012-12-18 – 2012-12-21 (×2): via INTRAVENOUS
  Filled 2012-12-16 (×6): qty 1000

## 2012-12-16 SURGICAL SUPPLY — 85 items
BAG SPEC RTRVL LRG 6X4 10 (ENDOMECHANICALS)
BALL CTTN LRG ABS STRL LF (GAUZE/BANDAGES/DRESSINGS)
BRUSH CYTOL CELLEBRITY 1.5X140 (MISCELLANEOUS) ×2 IMPLANT
CANISTER SUCTION 2500CC (MISCELLANEOUS) ×6 IMPLANT
CATH KIT ON Q 5IN SLV (PAIN MANAGEMENT) IMPLANT
CATH THORACIC 28FR (CATHETERS) IMPLANT
CATH THORACIC 36FR (CATHETERS) IMPLANT
CATH THORACIC 36FR RT ANG (CATHETERS) IMPLANT
CLEANER TIP ELECTROSURG 2X2 (MISCELLANEOUS) ×1 IMPLANT
CLIP TI MEDIUM 6 (CLIP) ×3 IMPLANT
CLOTH BEACON ORANGE TIMEOUT ST (SAFETY) ×3 IMPLANT
CONN ST 1/4X3/8  BEN (MISCELLANEOUS) ×1
CONN ST 1/4X3/8 BEN (MISCELLANEOUS) IMPLANT
CONN Y 3/8X3/8X3/8  BEN (MISCELLANEOUS) ×2
CONN Y 3/8X3/8X3/8 BEN (MISCELLANEOUS) IMPLANT
CONT SPEC 4OZ CLIKSEAL STRL BL (MISCELLANEOUS) ×15 IMPLANT
COTTONBALL LRG STERILE PKG (GAUZE/BANDAGES/DRESSINGS) IMPLANT
COVER TABLE BACK 60X90 (DRAPES) ×3 IMPLANT
DRAIN CHANNEL 28F RND 3/8 FF (WOUND CARE) ×1 IMPLANT
DRAIN CHANNEL 32F RND 10.7 FF (WOUND CARE) ×3 IMPLANT
DRAPE LAPAROSCOPIC ABDOMINAL (DRAPES) ×3 IMPLANT
DRAPE WARM FLUID 44X44 (DRAPE) ×6 IMPLANT
ELECT REM PT RETURN 9FT ADLT (ELECTROSURGICAL) ×3
ELECTRODE REM PT RTRN 9FT ADLT (ELECTROSURGICAL) ×2 IMPLANT
FILTER STRAW FLUID ASPIR (MISCELLANEOUS) ×2 IMPLANT
FORCEPS BIOP RJ4 1.8 (CUTTING FORCEPS) ×3 IMPLANT
GLOVE BIO SURGEON STRL SZ 6 (GLOVE) ×1 IMPLANT
GLOVE BIOGEL PI IND STRL 6 (GLOVE) IMPLANT
GLOVE BIOGEL PI IND STRL 7.0 (GLOVE) IMPLANT
GLOVE BIOGEL PI INDICATOR 6 (GLOVE) ×1
GLOVE BIOGEL PI INDICATOR 7.0 (GLOVE) ×3
GLOVE EUDERMIC 7 POWDERFREE (GLOVE) ×6 IMPLANT
GOWN PREVENTION PLUS XLARGE (GOWN DISPOSABLE) ×6 IMPLANT
GOWN STRL NON-REIN LRG LVL3 (GOWN DISPOSABLE) ×9 IMPLANT
KIT BASIN OR (CUSTOM PROCEDURE TRAY) ×3 IMPLANT
KIT ROOM TURNOVER OR (KITS) ×3 IMPLANT
KIT SUCTION CATH 14FR (SUCTIONS) ×3 IMPLANT
NDL BIOPSY TRANSBRONCH 21G (NEEDLE) ×2 IMPLANT
NEEDLE 22X1 1/2 (OR ONLY) (NEEDLE) IMPLANT
NEEDLE BIOPSY TRANSBRONCH 21G (NEEDLE) IMPLANT
NS IRRIG 1000ML POUR BTL (IV SOLUTION) ×6 IMPLANT
PACK CHEST (CUSTOM PROCEDURE TRAY) ×3 IMPLANT
PAD ARMBOARD 7.5X6 YLW CONV (MISCELLANEOUS) ×6 IMPLANT
POUCH ENDO CATCH II 15MM (MISCELLANEOUS) IMPLANT
POUCH SPECIMEN RETRIEVAL 10MM (ENDOMECHANICALS) IMPLANT
SEALANT PROGEL (MISCELLANEOUS) IMPLANT
SEALANT SURG COSEAL 4ML (VASCULAR PRODUCTS) IMPLANT
SEALANT SURG COSEAL 8ML (VASCULAR PRODUCTS) IMPLANT
SOLUTION ANTI FOG 6CC (MISCELLANEOUS) ×5 IMPLANT
SPECIMEN JAR MEDIUM (MISCELLANEOUS) ×2 IMPLANT
SPONGE GAUZE 4X4 12PLY (GAUZE/BANDAGES/DRESSINGS) ×3 IMPLANT
SUT PROLENE 4 0 RB 1 (SUTURE)
SUT PROLENE 4-0 RB1 .5 CRCL 36 (SUTURE) IMPLANT
SUT SILK  1 MH (SUTURE) ×2
SUT SILK 1 MH (SUTURE) ×2 IMPLANT
SUT SILK 2 0SH CR/8 30 (SUTURE) IMPLANT
SUT SILK 3 0SH CR/8 30 (SUTURE) IMPLANT
SUT VIC AB 0 CTX 27 (SUTURE) IMPLANT
SUT VIC AB 1 CTX 27 (SUTURE) ×1 IMPLANT
SUT VIC AB 2-0 CT1 27 (SUTURE)
SUT VIC AB 2-0 CT1 TAPERPNT 27 (SUTURE) IMPLANT
SUT VIC AB 2-0 CTX 36 (SUTURE) ×1 IMPLANT
SUT VIC AB 3-0 MH 27 (SUTURE) IMPLANT
SUT VIC AB 3-0 SH 27 (SUTURE)
SUT VIC AB 3-0 SH 27X BRD (SUTURE) IMPLANT
SUT VIC AB 3-0 X1 27 (SUTURE) ×5 IMPLANT
SUT VICRYL 0 UR6 27IN ABS (SUTURE) ×5 IMPLANT
SUT VICRYL 2 TP 1 (SUTURE) IMPLANT
SWAB COLLECTION DEVICE MRSA (MISCELLANEOUS) IMPLANT
SYR 30ML SLIP (SYRINGE) ×2 IMPLANT
SYR 5ML LL (SYRINGE) ×3 IMPLANT
SYR 5ML LUER SLIP (SYRINGE) ×3 IMPLANT
SYR CONTROL 10ML LL (SYRINGE) IMPLANT
SYSTEM SAHARA CHEST DRAIN ATS (WOUND CARE) ×3 IMPLANT
TIP APPLICATOR SPRAY EXTEND 16 (VASCULAR PRODUCTS) IMPLANT
TOWEL OR 17X24 6PK STRL BLUE (TOWEL DISPOSABLE) ×3 IMPLANT
TOWEL OR 17X26 10 PK STRL BLUE (TOWEL DISPOSABLE) ×7 IMPLANT
TRAP SPECIMEN MUCOUS 40CC (MISCELLANEOUS) ×6 IMPLANT
TRAY FOLEY CATH 14FR (SET/KITS/TRAYS/PACK) ×2 IMPLANT
TUBE ANAEROBIC SPECIMEN COL (MISCELLANEOUS) IMPLANT
TUBE CONNECTING 12X1/4 (SUCTIONS) ×3 IMPLANT
VALVE BIOPSY  SINGLE USE (MISCELLANEOUS)
VALVE BIOPSY SINGLE USE (MISCELLANEOUS) ×2 IMPLANT
VALVE SUCTION BRONCHIO DISP (MISCELLANEOUS) ×2 IMPLANT
WATER STERILE IRR 1000ML POUR (IV SOLUTION) ×6 IMPLANT

## 2012-12-16 NOTE — Brief Op Note (Addendum)
12/10/2012 - 12/16/2012  6:03 PM  PATIENT:  [name]  77 y.o. male  PRE-OPERATIVE DIAGNOSIS:  Right empyema  POST-OPERATIVE DIAGNOSIS:  Right empyema  PROCEDURE:    VIDEO BRONCHOSCOPY  RIGHT VIDEO ASSISTED THORACOSCOPY, DRAINAGE OF EMPYEMA, DECORTICATION  SURGEON:  Surgeon(s): Loreli Slot, MD  ASSISTANT: Coral Ceo, PA-C  ANESTHESIA:   general  SPECIMEN:  Source of Specimen:  Right pleural peel, pleural fluid  DISPOSITION OF SPECIMEN:  Pathology  DRAINS: 28 Fr CT x 1, 32 Blake drains x 2  PATIENT CONDITION:  PACU - hemodynamically stable.   FINDINGS: Foreign body occluding the posterior segmental bronchus of the RUL. No endobronchial mass lesions.  Severe organizing empyema with multiple loculated fluid collections and inflammatory peel on the visceral and parietal pleura

## 2012-12-16 NOTE — H&P (View-Only) (Signed)
  Subjective: C/o frequent cough with thick sputum  Objective: Vital signs in last 24 hours: Temp:  [97.6 F (36.4 C)-98.3 F (36.8 C)] 97.7 F (36.5 C) (12/29 0628) Pulse Rate:  [55-58] 55  (12/29 0628) Cardiac Rhythm:  [-]  Resp:  [18] 18  (12/29 0628) BP: (131-152)/(67-79) 131/79 mmHg (12/29 0628) SpO2:  [97 %] 97 % (12/29 0628)  Hemodynamic parameters for last 24 hours:    Intake/Output from previous day: 12/28 0701 - 12/29 0700 In: 2390 [I.V.:1640; IV Piggyback:750] Out: 2350 [Urine:2350] Intake/Output this shift:    General appearance: alert and no distress Lungs: rhonchi and wheezes R upper, no BS R lower  Lab Results:  [facility] 12/15/12 0517 12/14/12 0504  WBC 17.6* 16.0*  HGB 10.5* 10.9*  HCT 32.5* 33.4*  PLT 395 386   BMET:  Basename 12/15/12 0517 12/14/12 0504  NA 139 138  K 3.5 3.5  CL 105 106  CO2 24 24  GLUCOSE 107* 132*  BUN 13 18  CREATININE 0.83 0.78  CALCIUM 8.3* 8.6    PT/INR: No results found for this basename: LABPROT,INR in the last 72 hours ABG    Component Value Date/Time   TCO2 22 06/07/2012 2203   CBG (last 3)  No results found for this basename: GLUCAP:3 in the last 72 hours  Assessment/Plan: S/P   Right parapneumonic effusion/ empyema For Bronch, Right VATS, drainage of empyema and decortication tomorrow afternoon I once again reviewed the procedure and recovery with him. We discussed the risks/ benefits in detail yesterday.  He wishes to proceed.   LOS: 5 days    [name] C 12/15/2012

## 2012-12-16 NOTE — Anesthesia Procedure Notes (Signed)
Procedure Name: Intubation Date/Time: 12/16/2012 3:32 PM Performed by: Arlice Colt B Pre-anesthesia Checklist: Patient identified, Emergency Drugs available, Suction available, Patient being monitored and Timeout performed Patient Re-evaluated:Patient Re-evaluated prior to inductionOxygen Delivery Method: Circle system utilized Preoxygenation: Pre-oxygenation with 100% oxygen Intubation Type: IV induction Ventilation: Mask ventilation without difficulty Laryngoscope Size: Mac and 4 Grade View: Grade I Tube type: Oral Tube size: 8.5 mm Number of attempts: 1 Airway Equipment and Method: Stylet Placement Confirmation: ETT inserted through vocal cords under direct vision,  positive ETCO2 and breath sounds checked- equal and bilateral Secured at: 22 cm Tube secured with: Tape Dental Injury: Teeth and Oropharynx as per pre-operative assessment

## 2012-12-16 NOTE — Progress Notes (Signed)
ANTIBIOTIC CONSULT NOTE - FOLLOW UP  Pharmacy Consult for vancomycin Indication: HCAP and UTI  Allergies  Allergen Reactions  . Cholestatin Other (See Comments)    Per MAR    Patient Measurements: Height: 5\' 7"  (170.2 cm) Weight: 151 lb 6.4 oz (68.675 kg) IBW/kg (Calculated) : 66.1  Adjusted Body Weight:   Vital Signs: Temp: 98.7 F (37.1 C) (12/29 2253) Temp src: Oral (12/29 2253) BP: 155/81 mmHg (12/29 2253) Pulse Rate: 53  (12/29 2253) Intake/Output from previous day: 12/29 0701 - 12/30 0700 In: 653.8 [I.V.:653.8] Out: 1350 [Urine:1350] Intake/Output from this shift: Total I/O In: -  Out: 500 [Urine:500]  Labs:  [facility] 12/15/12 1137 12/15/12 0517 12/14/12 0504  WBC 20.3* 17.6* 16.0*  HGB 11.4* 10.5* 10.9*  PLT 470* 395 386  LABCREA -- -- --  CREATININE 0.98 0.83 0.78   Estimated Creatinine Clearance: 60.9 ml/min (by C-G formula based on Cr of 0.98).  Basename 12/15/12 2050  VANCOTROUGH 17.0  VANCOPEAK --  VANCORANDOM --  GENTTROUGH --  GENTPEAK --  GENTRANDOM --  TOBRATROUGH --  TOBRAPEAK --  TOBRARND --  AMIKACINPEAK --  AMIKACINTROU --  AMIKACIN --     Microbiology: Recent Results (from the past 720 hour(s))  URINE CULTURE     Status: Normal   Collection Time   12/10/12  6:30 PM      Component Value Range Status Comment   Specimen Description URINE, CLEAN CATCH   Final    Special Requests NONE   Final    Culture  Setup Time 12/11/2012 02:29   Final    Colony Count >=100,000 COLONIES/ML   Final    Culture     Final    Value: METHICILLIN RESISTANT STAPHYLOCOCCUS AUREUS     27 Note: RIFAMPIN AND GENTAMICIN SHOULD NOT BE USED AS SINGLE DRUGS FOR TREATMENT OF STAPH INFECTIONS. CRITICAL RESULT CALLED TO, READ BACK BY AND VERIFIED WITH: CYNTHIA WHITE ON 12 13 @ 1240 BY FERGK   Report Status 12/13/2012 FINAL   Final    Organism ID, Bacteria METHICILLIN RESISTANT STAPHYLOCOCCUS AUREUS   Final   MRSA PCR SCREENING     Status: Normal   Collection  Time   12/10/12 10:53 PM      Component Value Range Status Comment   MRSA by PCR NEGATIVE  NEGATIVE Final   CULTURE, BLOOD (ROUTINE X 2)     Status: Normal (Preliminary result)   Collection Time   12/11/12  2:00 PM      Component Value Range Status Comment   Specimen Description BLOOD RIGHT ARM   Final    Special Requests BOTTLES DRAWN AEROBIC AND ANAEROBIC 6CC   Final    Culture  Setup Time 12/12/2012 00:25   Final    Culture     Final    Value:        BLOOD CULTURE RECEIVED NO GROWTH TO DATE CULTURE WILL BE HELD FOR 5 DAYS BEFORE ISSUING A FINAL NEGATIVE REPORT   Report Status PENDING   Incomplete   CULTURE, BLOOD (ROUTINE X 2)     Status: Normal (Preliminary result)   Collection Time   12/11/12  2:07 PM      Component Value Range Status Comment   Specimen Description BLOOD RIGHT ARM   Final    Special Requests BOTTLES DRAWN AEROBIC AND ANAEROBIC 10CC   Final    Culture  Setup Time 12/12/2012 00:25   Final    Culture     Final  Value:        BLOOD CULTURE RECEIVED NO GROWTH TO DATE CULTURE WILL BE HELD FOR 5 DAYS BEFORE ISSUING A FINAL NEGATIVE REPORT   Report Status PENDING   Incomplete   BODY FLUID CULTURE     Status: Normal   Collection Time   12/12/12  2:22 PM      Component Value Range Status Comment   Specimen Description PLEURAL   Final    Special Requests Normal   Final    Gram Stain     Final    Value: RARE WBC PRESENT,BOTH PMN AND MONONUCLEAR     NO ORGANISMS SEEN   Culture NO GROWTH 3 DAYS   Final    Report Status 12/15/2012 FINAL   Final     Anti-infectives     Start     Dose/Rate Route Frequency Ordered Stop   12/10/12 2200  piperacillin-tazobactam (ZOSYN) IVPB 3.375 g       3.375 g 12.5 mL/hr over 240 Minutes Intravenous 3 times per day 12/10/12 2139     12/10/12 2200   vancomycin (VANCOCIN) IVPB 1000 mg/200 mL premix        1,000 mg 200 mL/hr over 60 Minutes Intravenous Every 12 hours 12/10/12 2147     12/10/12 1945   cefTRIAXone (ROCEPHIN) 1 g in  dextrose 5 % 50 mL IVPB  Status:  Discontinued        1 g 100 mL/hr over 30 Minutes Intravenous Every 24 hours 12/10/12 1937 12/10/12 2139   12/10/12 1945   azithromycin (ZITHROMAX) tablet 500 mg        500 mg Oral  Once 12/10/12 1937 12/10/12 2019          Assessment: Patient with vancomycin level at goal.    Goal of Therapy:  Vancomycin trough level 15-20 mcg/ml  Plan:  Measure antibiotic drug levels at steady state Follow up culture results Continue with current dose.  Darlina Guys, Jacquenette Shone Crowford 12/16/2012,2:34 AM

## 2012-12-16 NOTE — Anesthesia Preprocedure Evaluation (Signed)
Anesthesia Evaluation  Patient identified by MRN, date of birth, ID band Patient awake    Reviewed: Allergy & Precautions, H&P , NPO status , Patient's Chart, lab work & pertinent test results, reviewed documented beta blocker date and time   Airway Mallampati: II TM Distance: >3 FB Neck ROM: full    Dental   Pulmonary pneumonia -, unresolved,  breath sounds clear to auscultation        Cardiovascular hypertension, On Medications Rhythm:regular     Neuro/Psych PSYCHIATRIC DISORDERS  Neuromuscular disease  C-spine cleared    GI/Hepatic negative GI ROS, Neg liver ROS,   Endo/Other  diabetes  Renal/GU Renal disease  negative genitourinary   Musculoskeletal   Abdominal   Peds  Hematology negative hematology ROS (+)   Anesthesia Other Findings See surgeon's H&P   Reproductive/Obstetrics negative OB ROS                           Anesthesia Physical Anesthesia Plan  ASA: IV  Anesthesia Plan: General   Post-op Pain Management:    Induction: Intravenous  Airway Management Planned: Double Lumen EBT  Additional Equipment: Arterial line, CVP and Ultrasound Guidance Line Placement  Intra-op Plan:   Post-operative Plan: Extubation in OR  Informed Consent: I have reviewed the patients History and Physical, chart, labs and discussed the procedure including the risks, benefits and alternatives for the proposed anesthesia with the patient or authorized representative who has indicated his/her understanding and acceptance.   Dental Advisory Given  Plan Discussed with: CRNA and Surgeon  Anesthesia Plan Comments:         Anesthesia Quick Evaluation

## 2012-12-16 NOTE — Transfer of Care (Signed)
Immediate Anesthesia Transfer of Care Note  Patient: [name]  Procedure(s) Performed: Procedure(s) (LRB) with comments: VIDEO BRONCHOSCOPY (Right) VIDEO ASSISTED THORACOSCOPY (VATS)/EMPYEMA (Right)  Patient Location: PACU  Anesthesia Type:General  Level of Consciousness: responds to stimulation  Airway & Oxygen Therapy: Patient Spontanous Breathing and Patient connected to face mask oxygen  Post-op Assessment: Report given to PACU RN and Post -op Vital signs reviewed and stable  Post vital signs: Reviewed and stable  Complications: No apparent anesthesia complications

## 2012-12-16 NOTE — OR Nursing (Signed)
Video Bronchoscopy ended at 1548.  Right Video Assisted Thoracoscopy started at 1623.

## 2012-12-16 NOTE — Interval H&P Note (Signed)
History and Physical Interval Note:  12/16/2012 3:12 PM  [name]  has presented today for surgery, with the diagnosis of pleural effusion right  The various methods of treatment have been discussed with the patient and family. After consideration of risks, benefits and other options for treatment, the patient has consented to  Procedure(s) (LRB) with comments: VIDEO BRONCHOSCOPY (Right) VIDEO ASSISTED THORACOSCOPY (VATS)/EMPYEMA (Right) as a surgical intervention .  The patient's history has been reviewed, patient examined, no change in status, stable for surgery.  I have reviewed the patient's chart and labs.  Questions were answered to the patient's satisfaction.     Taleah Bellantoni C

## 2012-12-16 NOTE — Progress Notes (Signed)
Patient ID: [name], male   DOB: [birth year], 77 y.o.   MRN: [id number]  [facility] PROGRESS NOTE  [name] WUJ:[id number] DOB: [birth year] DOA: 12/10/2012 PCP: [name], MD  Brief narrative:  Pt is 77 yo male with parkinsonism, HTN, bipolar disorder, chronic indwelling cath and frequent UTI's, recent fall with C2 fx, who presented to the ER 12/24 as he slid off his bed and has suffered subsequent right sides chest pain. The most recent UTI was with pseudomonas sensitive to Cipro, Cefepime, Zosyn. He admitted to having yellow sputum productive coughs, but no fever or shortness of breath. Work up in the ER included CXR and has indicated right pleural base consolidation. A f/up CT showed right upper lobe consolidation with moderate pleural effusion (parapneumonic effusion).   Principal Problem:  * Right Parapneumonic Effusion/ Empyema  - plan for bronch, Right VATS, drainage of empyema and decortication today - TCTS following, appreciate input  - continue broad spectrum ABX, Vancomycin and Zosyn day#7 Active Problems:  Bipolar 1 disorder  - stable, continue home medication regimen  Anemia of chronic disease - Hg and Hct slightly down from yesterday - will continue to monitor and obtain anemia panel Parkinson disease  - stable, continue home medication regimen  Urinary tract infection  - possibly related to chronic colonization as pt is asymptomatic  - Vancomycin should cover in case  Hypertension  - reasonably well controlled  Diabetes mellitus, type 2  - continue SSI only at this time, sensitive coverage  Chronic indwelling foley catheter  - keep in place   Consultants:  TCTS Procedures/Studies:  Dg Chest 2 View: 12/14/2012 --> Enlarging right effusion which is at least partially loculated. Diffuse right lung airspace disease, slightly worsened.  Dg Chest Bilateral Decubitus 12/14/2012 --> Right effusion appears predominately loculated with possible small free  flowing component.  Ultrasound guided thoracentesis 12/26  Antibiotics/Micro:  Vancomycin 12/24 -->  Zosyn 12/24 -->  Urine culture 12/24 --> MRSA +  MRSA PCR 12/24 --> +  Blood Culture 12/25 --> No growth to date  Pleural fluid culture 12/26 --> No growth to date   Code Status: Full  Family Communication: Pt at bedside  Disposition Plan: Not ready for discharge   HPI/Subjective: No events overnight. Pt denies chest pain or shortness of breath at rest this AM.  Objective: Filed Vitals:   12/15/12 0628 12/15/12 1400 12/15/12 2253 12/16/12 0713  BP: 131/79 136/74 155/81 125/73  Pulse: 55 63 53 66  Temp: 97.7 F (36.5 C) 98.4 F (36.9 C) 98.7 F (37.1 C) 98.9 F (37.2 C)  TempSrc: Oral Oral Oral Oral  Resp: 18 18 18 18   Height:      Weight:      SpO2: 97% 93% 96% 94%    Intake/Output Summary (Last 24 hours) at 12/16/12 0914 Last data filed at 12/16/12 0800  Gross per 24 hour  Intake 653.75 ml  Output   1700 ml  Net -1046.25 ml    Exam:   General:  Pt is alert, follows commands appropriately, not in acute distress  Cardiovascular: Regular rhythm, bradycardic, S1/S2, no murmurs, no rubs, no gallops  Respiratory: Decreased breath sounds R>L  Abdomen: Soft, non tender, non distended, bowel sounds present, no guarding  Extremities: No edema, pulses DP and PT palpable bilaterally  Neuro: Grossly nonfocal  Data Reviewed: Basic Metabolic Panel:  Lab 12/16/12 9562 12/15/12 1137 12/15/12 0517 12/14/12 0504 12/12/12 0755  NA 138 138 139 138 135  K 3.8  3.4* 3.5 3.5 3.7  CL 105 104 105 106 103  CO2 25 27 24 24 24   GLUCOSE 113* 174* 107* 132* 115*  BUN 14 14 13 18  25*  CREATININE 1.14 0.98 0.83 0.78 0.97  CALCIUM 8.6 8.4 8.3* 8.6 8.7  MG -- -- -- -- --  PHOS -- -- -- -- --   Liver Function Tests:  Lab 12/15/12 1137 12/14/12 0504 12/11/12 1215  AST 6 10 6   ALT 5 <5 <5  ALKPHOS 71 73 76  BILITOT 0.2* 0.3 0.2*  PROT 5.1* 5.0* 5.2*  ALBUMIN 2.0* 2.0* 2.2*    CBC:  Lab 12/16/12 0500 12/15/12 1137 12/15/12 0517 12/14/12 0504 12/12/12 0755 12/11/12 1215 12/10/12 1640  WBC 22.6* 20.3* 17.6* 16.0* 19.7* -- --  NEUTROABS -- -- 14.3* 12.8* 17.2* 22.2* 20.0*  HGB 10.2* 11.4* 10.5* 10.9* 11.0* -- --  HCT 32.0* 34.6* 32.5* 33.4* 32.8* -- --  MCV 87.0 85.6 86.4 86.1 84.8 -- --  PLT 434* 470* 395 386 352 -- --   CBG:  Lab 12/16/12 0746 12/15/12 2222 12/15/12 1640  GLUCAP 100* 114* 118*     Scheduled Meds:   . aspirin EC  81 mg Oral q morning - 10a  . carbidopa-levodopa  1 tablet Oral TID  . entacapone  200 mg Oral TID  . insulin aspart  0-9 Units Subcutaneous BID AC  . lamoTRIgine  150 mg Oral BID  . multivitamin with minerals  1 tablet Oral q morning - 10a  . neomycin-bacitracin-polymyxin  1 application Topical Daily  . pantoprazole  40 mg Oral Daily  . piperacillin-tazobactam  3.375 g Intravenous Q8H  . QUEtiapine  50 mg Oral QHS  . sertraline  150 mg Oral q morning - 10a  . vancomycin  1,000 mg Intravenous Q12H   Continuous Infusions:    Debbora Presto, MD  [facility] Pager [phone]  If 7PM-7AM, please contact night-coverage [email or web address] Password [email or web address] 12/16/2012, 9:14 AM   LOS: 6 days

## 2012-12-16 NOTE — Anesthesia Postprocedure Evaluation (Signed)
Anesthesia Post Note  Patient: [name]  Procedure(s) Performed: Procedure(s) (LRB): VIDEO BRONCHOSCOPY (Right) VIDEO ASSISTED THORACOSCOPY (VATS)/EMPYEMA (Right)  Anesthesia type: General  Patient location: PACU  Post pain: Pain level controlled and Adequate analgesia  Post assessment: Post-op Vital signs reviewed, Patient's Cardiovascular Status Stable, Respiratory Function Stable, Patent Airway and Pain level controlled  Last Vitals:  Filed Vitals:   12/16/12 1908  BP:   Pulse: 57  Temp:   Resp: 21    Post vital signs: Reviewed and stable  Level of consciousness: awake, alert  and oriented  Complications: No apparent anesthesia complications

## 2012-12-16 NOTE — Progress Notes (Signed)
12 lead EKG performed this am.  NSR with occassional PVC's patient is asymptomatic.  Dr. Izola Price is aware.  Will monitor.

## 2012-12-16 NOTE — Progress Notes (Signed)
Fentanyl PCA syringe handed off to [employer] on 2300.  He will initiate PCA at at later time.

## 2012-12-17 ENCOUNTER — Inpatient Hospital Stay (HOSPITAL_COMMUNITY): Payer: Medicare Other

## 2012-12-17 ENCOUNTER — Encounter (HOSPITAL_COMMUNITY): Payer: Self-pay | Admitting: Thoracic Surgery (Cardiothoracic Vascular Surgery)

## 2012-12-17 LAB — BASIC METABOLIC PANEL
BUN: 23 mg/dL (ref 6–23)
CO2: 25 mEq/L (ref 19–32)
Calcium: 8.7 mg/dL (ref 8.4–10.5)
Chloride: 105 mEq/L (ref 96–112)
Creatinine, Ser: 1.3 mg/dL (ref 0.50–1.35)

## 2012-12-17 LAB — POCT I-STAT 3, ART BLOOD GAS (G3+)
Acid-base deficit: 1 mmol/L (ref 0.0–2.0)
Bicarbonate: 24.2 mEq/L — ABNORMAL HIGH (ref 20.0–24.0)
O2 Saturation: 95 %
Patient temperature: 98.2
pO2, Arterial: 74 mmHg — ABNORMAL LOW (ref 80.0–100.0)

## 2012-12-17 LAB — CBC
HCT: 29.7 % — ABNORMAL LOW (ref 39.0–52.0)
MCH: 28.9 pg (ref 26.0–34.0)
MCV: 85.8 fL (ref 78.0–100.0)
Platelets: 455 10*3/uL — ABNORMAL HIGH (ref 150–400)
RDW: 15.1 % (ref 11.5–15.5)

## 2012-12-17 LAB — GLUCOSE, CAPILLARY: Glucose-Capillary: 132 mg/dL — ABNORMAL HIGH (ref 70–99)

## 2012-12-17 MED ORDER — DOCUSATE SODIUM 100 MG PO CAPS: 100.0000 mg | ORAL_CAPSULE | Freq: Two times a day (BID) | ORAL | Status: DC | PRN

## 2012-12-17 MED ORDER — TRAZODONE HCL 50 MG PO TABS
50.0000 mg | ORAL_TABLET | Freq: Every evening | ORAL | Status: DC | PRN
  Administered 2012-12-18 – 2012-12-22 (×4): 50 mg via ORAL
  Filled 2012-12-17 (×5): qty 1

## 2012-12-17 MED ORDER — BIOTENE DRY MOUTH MT LIQD
15.0000 mL | Freq: Two times a day (BID) | OROMUCOSAL | Status: DC
  Administered 2012-12-18 – 2012-12-22 (×10): 15 mL via OROMUCOSAL

## 2012-12-17 MED ORDER — GUAIFENESIN ER 600 MG PO TB12
1200.0000 mg | ORAL_TABLET | Freq: Two times a day (BID) | ORAL | Status: DC | PRN
  Filled 2012-12-17: qty 2

## 2012-12-17 MED ORDER — ENOXAPARIN SODIUM 40 MG/0.4ML ~~LOC~~ SOLN
40.0000 mg | SUBCUTANEOUS | Status: DC
  Administered 2012-12-17 – 2012-12-24 (×8): 40 mg via SUBCUTANEOUS
  Filled 2012-12-17 (×8): qty 0.4

## 2012-12-17 MED ORDER — CHLORHEXIDINE GLUCONATE 0.12 % MT SOLN
15.0000 mL | Freq: Two times a day (BID) | OROMUCOSAL | Status: DC
  Administered 2012-12-17 – 2012-12-22 (×10): 15 mL via OROMUCOSAL
  Filled 2012-12-17 (×13): qty 15

## 2012-12-17 MED ORDER — SODIUM CHLORIDE 0.9 % IJ SOLN
10.0000 mL | INTRAMUSCULAR | Status: DC | PRN
  Filled 2012-12-17: qty 10
  Filled 2012-12-17: qty 20
  Filled 2012-12-17: qty 30
  Filled 2012-12-17: qty 20
  Filled 2012-12-17: qty 10

## 2012-12-17 MED ORDER — SODIUM CHLORIDE 0.9 % IJ SOLN
10.0000 mL | Freq: Two times a day (BID) | INTRAMUSCULAR | Status: DC
  Administered 2012-12-17 – 2012-12-18 (×2): 10 mL
  Administered 2012-12-18: 20 mL
  Administered 2012-12-19 (×2): 10 mL
  Administered 2012-12-20: 20 mL
  Administered 2012-12-20: 10 mL
  Administered 2012-12-21: 20 mL
  Administered 2012-12-21 – 2012-12-22 (×3): 10 mL
  Administered 2012-12-23 (×2): 20 mL
  Filled 2012-12-17 (×2): qty 10
  Filled 2012-12-17: qty 20
  Filled 2012-12-17: qty 10
  Filled 2012-12-17 (×2): qty 20
  Filled 2012-12-17: qty 10

## 2012-12-17 NOTE — Op Note (Signed)
NAMEJAYLEND, REILAND NO.:  [id number]  MEDICAL RECORD NO.:  [id number]  LOCATION:  2303                         FACILITY:  [facility]  PHYSICIAN:  Salvatore Decent. Dorris Fetch, M.D.DATE OF BIRTH:  November 16, 1935  DATE OF PROCEDURE:  12/16/2012 DATE OF DISCHARGE:                              OPERATIVE REPORT   PREOPERATIVE DIAGNOSIS:  Right empyema.  POSTOPERATIVE DIAGNOSIS:  Organizing empyema, right pleura, foreign body obstructing posterior segmental bronchus of right upper lobe.  PROCEDURE:  Bronchoscopy with extraction of foreign body, right video- assisted thoracoscopy, drainage of empyema, visceral and parietal pleural decortication.  SURGEON:  Salvatore Decent. Dorris Fetch, MD  ASSISTANT:  Coral Ceo, PA  ANESTHESIA:  General.  FINDINGS:  Bronchoscopy, normal endobronchial anatomy.  There was a foreign body, which appeared to be a kernel of corn obstructing the posterior segmental bronchus of the right upper lobe.  There were purulent secretions.  Foreign body was removed without difficulty.  Right VATS, organizing empyema with extensive visceral and parietal pleural peel, extensive pleural debris, and multiloculated fluid Collections. Good re-expansion of the lung post decortication.  CLINICAL NOTE:  Mr. Lautner is a 77 year old gentleman with multiple medical issues including Parkinson's disease and bipolar disorder.  He recently developed a persistent cough.  He was treated with Mucinex.  He presented to the emergency room after a fall and a chest x-ray showed right lung consolidation and an effusion.  A CT of the chest was done which showed pleural effusion with compressive atelectasis. Thoracentesis was performed and the fluid was consistent with an exudate, likely an empyema.  He initially improved after thoracentesis but had rapid reaccumulation of the fluid and was advised to undergo bronchoscopy and right video-assisted thoracoscopy for drainage  of effusion and possible decortication.  The indications, risks, benefits, and alternatives were discussed in detail with the patient.  He understood those risks, accepted them, and agreed to proceed.  OPERATIVE NOTE:  Mr. Printy was brought to the operating room on December 16, 2012.  Anesthesia induced general anesthesia and intubated with a single-lumen endotracheal tube.  A flexible fiberoptic bronchoscope was placed.  There were some thick purulent secretions on both sides.  On the left side, there was normal endobronchial anatomy. On the right side, there was normal endobronchial anatomy of the lower lobe.  There was some extrinsic compression on the middle lobe but no mass lesions were seen.  The upper lobe bronchus was inspected and the posterior segmental bronchus was totally occluded with a foreign body and this appeared to be a kernel of corn.  Biopsy forceps were used to grasp and extract this foreign body which was removed.  After removal of the foreign body, there was significant edema in the airways and there was pus in the airway which was cleared with saline.  There was no endobronchial mass lesion seen.  The patient was then reintubated with a double-lumen endotracheal tube. Foley catheter was placed.  He was then placed in a left lateral decubitus position and the right chest was prepped and draped in usual sterile fashion.  PAS hose were in place for DVT prophylaxis.  Single lung ventilation of the left lung was carried out.  An incision was made in the 8th intercostal space in the midaxillary line and was carried through the skin and subcutaneous tissue.  The chest was entered bluntly using a hemostat.  Immediately on entering the chest, it was noted that there were significant adhesions of the lung to the chest wall in the area where the chest was entered and significant adhesions of the lung to the diaphragm.  Adhesions were taken down to allow the scope to be  placed into the chest.  A small utility incision to be made anterolaterally in approximately the 5th intercostal space.  The lung then was freed from the chest wall inferiorly, and in doing so, multiple loculated fluid collections were encountered.  The fluid was greenish yellow and foul appearing.  It was sent for both culture and cytology. It was now evident that there was an organizing empyema with the right lung being encased in a visceral pleural peel.  There was also extensive peel on the parietal pleural surface, which was very friable. Additional fluid collections were broken up and the parietal pleural peel was removed.  Attention then was turned to the visceral pleural peel, which was very adherent and difficult to dissect off.  It was a tedious process creating a plane and then stripping the pleura.  The lung was intermittently reinflated to see which areas were still entrapped.  Care was taken to explore into the major fissure to make sure that there was no residual fluid in it.  Ultimately there was a good result with a decortication.  There was a minimal air leak, primarily from the lower lobe.  The chest was copiously irrigated with saline.  Hemostasis was achieved.  Three chest tubes were placed. 2 were 32-French Blake drains, 1 placed posteriorly and superiorly, 1 placed posterior and laterally and finally a 28-French drain was placed anteriorly.  All were secured with #1 silk sutures. Before reinflating the lung, the patient developed hypoxia, became difficult to ventilate and had bradycardia and hypotension.  Immediately when this happened, the right lung was reinflated.  Dr. Gelene Mink from anesthesia performed repeat bronchoscopy and checked tube position and the patient recovered rapidly.  At this point, the remaining incision was closed with a #1 Vicryl fascial sutures, a 2-0 Vicryl subcutaneous suture, and a 3-0 Vicryl subcuticular suture.  The patient then was taken  from the operating room to the postanesthetic care unit intubated and in good condition.     Salvatore Decent Dorris Fetch, M.D.     SCH/MEDQ  D:  12/16/2012  T:  12/17/2012  Job:  [id number]

## 2012-12-17 NOTE — Progress Notes (Addendum)
1 Day Post-Op Procedure(s) (LRB): VIDEO BRONCHOSCOPY (Right) VIDEO ASSISTED THORACOSCOPY (VATS)/EMPYEMA (Right) Subjective:  Patient somnolent this morning.  States he thinks his pain is controlled.    Objective: Vital signs in last 24 hours: Temp:  [97.4 F (36.3 C)-98.6 F (37 C)] 97.4 F (36.3 C) (12/31 0400) Pulse Rate:  [49-76] 53  (12/31 0800) Cardiac Rhythm:  [-] Sinus bradycardia (12/31 0800) Resp:  [14-24] 15  (12/31 0800) BP: (112-159)/(52-78) 133/59 mmHg (12/31 0800) SpO2:  [94 %-100 %] 100 % (12/31 0800) Arterial Line BP: (122-188)/(43-72) 128/43 mmHg (12/31 0800) Weight:  [153 lb 10.6 oz (69.7 kg)] 153 lb 10.6 oz (69.7 kg) (12/31 0600)  Hemodynamic parameters for last 24 hours:    Intake/Output from previous day: 12/30 0701 - 12/31 0700 In: 2950 [I.V.:2500; IV Piggyback:450] Out: 2555 [Urine:1905; Blood:300; Chest Tube:350] Intake/Output this shift: Total I/O In: 109 [I.V.:109] Out: -   General appearance: no distress and somnolent Heart: regular rate and rhythm Lungs: diminished breath sounds right Abdomen: soft, non-tender; bowel sounds normal; no masses,  no organomegaly Wound: clean and dry  Lab Results:  Basename 12/17/12 0351 12/16/12 0500  WBC 25.1* 22.6*  HGB 10.0* 10.2*  HCT 29.7* 32.0*  PLT 455* 434*   BMET:  Basename 12/17/12 0351 12/16/12 0500  NA 140 138  K 3.8 3.8  CL 105 105  CO2 25 25  GLUCOSE 133* 113*  BUN 23 14  CREATININE 1.30 1.14  CALCIUM 8.7 8.6    PT/INR:  Basename 12/15/12 1137  LABPROT 15.5*  INR 1.25   ABG    Component Value Date/Time   PHART 7.391 12/17/2012 0614   HCO3 24.2* 12/17/2012 0614   TCO2 25 12/17/2012 0614   ACIDBASEDEF 1.0 12/17/2012 0614   O2SAT 95.0 12/17/2012 0614   CBG (last 3)   Basename 12/17/12 0349 12/16/12 1837 12/16/12 1423  GLUCAP 132* 136* 97    Assessment/Plan: S/P Procedure(s) (LRB): VIDEO BRONCHOSCOPY (Right) VIDEO ASSISTED THORACOSCOPY (VATS)/EMPYEMA (Right)  1.  Chest tubes- no air leak appreciated this morning, no pneumothorax per CXR, 350 cc output, leave in place today 2. D/C Arterial Line 3. Leave foley in place- chronic was present on outpatient basis 4. Renal- creatinine slightly increase, will monitor may need to adjust dose of Vancomycin 5. Acute post operative anemia- stable 6. Dispo- will continue patient's Parkinson Medications, psych medications, continue current care   LOS: 7 days    BARRETT, [name] 12/17/2012   Patient seen and examined. Agree with above.  He needs a speech therapy eval for aspiration  CXR looks much better- has some pleuroparenchymal thickening and some residual infiltrate in RUL

## 2012-12-17 NOTE — Evaluation (Signed)
Clinical/Bedside Swallow Evaluation Patient Details  Name: [name] MRN: [id number] Date of Birth: 27-Sep-1935  Today's Date: 12/17/2012 Time: 0950-1016 SLP Time Calculation (min): 26 min  Past Medical History:  Past Medical History  Diagnosis Date  . Hearing aid worn   . Hypertension   . Parkinson disease   . Pneumonia   . Diabetes mellitus type II   . Arthritis   . Bipolar disorder   . Anxiety   . Depression   . MRSA infection (methicillin-resistant Staphylococcus aureus)   . Chronic indwelling foley catheter   . C2 cervical fracture     due to pt fall  . SIRS (systemic inflammatory response syndrome)   . Neuromuscular disorder    Past Surgical History:  Past Surgical History  Procedure Date  . Total hip arthroplasty    HPI:  [name] is a 77 year old gentleman with multiple  medical issues including Parkinson disease and bipolar disorder. He recently felt a persistent cough. He was treated with Mucinex. He presented to the emergency room after a fall and a chest x-ray showed right lung consolidation and an effusion. A CT of the chest was done which showed pleural effusion with compressive atelectasis. Thoracentesis was performed and the fluid was consistent with an exudate, likely an empyema. He initially improved after thoracentesis but had rapid reaccumulation of the fluid and was advised to undergo bronchoscopy and right video-assisted thoracoscopy for drainage of effusion and possible decortication. Bronchoscopy revealed foreign body in the posterior segmental bronchus of right upper lobe, a piece of corn. Decoritcation completed with good reexpansion of lung.    Assessment / Plan / Recommendation Clinical Impression  Pt with a history of oral and oropharyngeal dysphagia, on dys 3 (mechanical soft) diet prior to admit. Pt continues to present with prolonged oral transit of solids and liquids with probable delayed swallow initiation consistent with findings of MBS  in 8/13. Pts function likely worsened today by lethargy. Overt signs of aspiration observed with thin liquids. Skilled treatment included moderate verbal and tactile cues for sustened attention to PO and decreased oral transit time and timely swallow initiation. Pt too fatigued to demonstrate functional improvement during this session.   Suggest pt downgrade to NPO status except for ice chips and meds crushed in puree. Proven aspiration of granulated solids at baseline (corn in bronchus) and objective test indicating significantly delayed swallow initiation suggest high aspriation risk. Pt will likely require therapeutic intervention and diet modification to reduce risk.     Aspiration Risk  Severe    Diet Recommendation NPO except meds;Ice chips PRN after oral care   Medication Administration: Crushed with puree Supervision: Staff feed patient;Full supervision/cueing for compensatory strategies Postural Changes and/or Swallow Maneuvers: Seated upright 90 degrees    Other  Recommendations     Follow Up Recommendations  Inpatient Rehab    Frequency and Duration min 2x/week  2 weeks   Pertinent Vitals/Pain NA    SLP Swallow Goals Patient will utilize recommended strategies during swallow to increase swallowing safety with: Minimal cueing   Swallow Study Prior Functional Status       General HPI: [name] is a 77 year old gentleman with multiple  medical issues including Parkinson disease and bipolar disorder. He recently felt a persistent cough. He was treated with Mucinex. He presented to the emergency room after a fall and a chest x-ray showed right lung consolidation and an effusion. A CT of the chest was done which showed pleural effusion with compressive atelectasis.  Thoracentesis was performed and the fluid was consistent with an exudate, likely an empyema. He initially improved after thoracentesis but had rapid reaccumulation of the fluid and was advised to undergo  bronchoscopy and right video-assisted thoracoscopy for drainage of effusion and possible decortication. Bronchoscopy revealed foreign body in the posterior segmental bronchus of right upper lobe, a piece of corn. Decoritcation completed with good reexpansion of lung.  Type of Study: Bedside swallow evaluation Previous Swallow Assessment: MBS 08/15/12 - Pt. demonstrated mod oral dysphagia and mild sensory-motor pharyngeal dysphagia.  Oral prep and masitcation of cracker was functional with delayed transit to posterior oral cavity.  Multiple verbal cues provided to move cracker back and initiate swallow.  Sensory deficits resulted in delayed swallow initiation.  Lingual residue spilled to valleculae and pyriform sinuses post swallow (moderate amount) with no sensation or volitional swallow to clear from pt.  Verbal and visual cues were mild-moderately effective in reducing stasis.  Completed airway protection present without penetration or aspiration.  He is at higer risk for aspiration from residuals.  Recommend a Dys 3 diet and thin liquids, full supervision, straws ok as long as sips are single and controlled, pills whole in applesauce. Diet Prior to this Study: NPO Temperature Spikes Noted: No Respiratory Status: Supplemental O2 delivered via (comment) History of Recent Intubation: No Behavior/Cognition: Lethargic;Cooperative Oral Cavity - Dentition: Adequate natural dentition Self-Feeding Abilities: Total assist Patient Positioning: Upright in chair Baseline Vocal Quality: Clear Volitional Cough: Strong Volitional Swallow: Unable to elicit    Oral/Motor/Sensory Function Overall Oral Motor/Sensory Function: Appears within functional limits for tasks assessed   Ice Chips Ice chips: Impaired Presentation: Spoon Oral Phase Impairments: Impaired anterior to posterior transit Oral Phase Functional Implications: Prolonged oral transit Pharyngeal Phase Impairments: Decreased hyoid-laryngeal movement     Thin Liquid Thin Liquid: Impaired Presentation: Cup;Straw Oral Phase Impairments: Impaired anterior to posterior transit Oral Phase Functional Implications: Oral holding;Prolonged oral transit Pharyngeal  Phase Impairments: Suspected delayed Swallow;Decreased hyoid-laryngeal movement;Multiple swallows;Throat Clearing - Immediate    Nectar Thick Nectar Thick Liquid: Not tested   Honey Thick Honey Thick Liquid: Not tested   Puree Puree: Impaired Presentation: Spoon Oral Phase Impairments: Impaired anterior to posterior transit;Reduced lingual movement/coordination Oral Phase Functional Implications: Prolonged oral transit Pharyngeal Phase Impairments: Suspected delayed Swallow;Decreased hyoid-laryngeal movement;Multiple swallows   Solid   GO    Solid: Not tested       Virgilia Quigg, Riley Nearing 12/17/2012,10:31 AM

## 2012-12-17 NOTE — Progress Notes (Signed)
Utilization review completed.  

## 2012-12-17 NOTE — Progress Notes (Signed)
Patient transferred from Unit [address] to [address], after report was given to receiving RN, Dahlia Client.  Patients chart, medications and personal belongings all sent with patient.  Keitha Butte, RN

## 2012-12-18 ENCOUNTER — Inpatient Hospital Stay (HOSPITAL_COMMUNITY): Payer: Medicare Other

## 2012-12-18 LAB — CBC
HCT: 26.9 % — ABNORMAL LOW (ref 39.0–52.0)
Hemoglobin: 9.1 g/dL — ABNORMAL LOW (ref 13.0–17.0)
MCH: 28.6 pg (ref 26.0–34.0)
MCHC: 33.8 g/dL (ref 30.0–36.0)
MCV: 84.6 fL (ref 78.0–100.0)
RBC: 3.18 MIL/uL — ABNORMAL LOW (ref 4.22–5.81)

## 2012-12-18 LAB — CULTURE, BLOOD (ROUTINE X 2): Culture: NO GROWTH

## 2012-12-18 LAB — COMPREHENSIVE METABOLIC PANEL
ALT: <5 U/L (ref 0–53)
BUN: 18 mg/dL (ref 6–23)
CO2: 27 mEq/L (ref 19–32)
Calcium: 8.4 mg/dL (ref 8.4–10.5)
Creatinine, Ser: 1.1 mg/dL (ref 0.50–1.35)
GFR calc Af Amer: 74 mL/min — ABNORMAL LOW (ref >90)
GFR calc non Af Amer: 64 mL/min — ABNORMAL LOW (ref >90)
Glucose, Bld: 156 mg/dL — ABNORMAL HIGH (ref 70–99)
Total Protein: 4.7 g/dL — ABNORMAL LOW (ref 6.0–8.3)

## 2012-12-18 MED ORDER — RESOURCE THICKENUP CLEAR PO POWD
ORAL | Status: DC | PRN
  Filled 2012-12-18: qty 125

## 2012-12-18 MED ORDER — POTASSIUM CHLORIDE 10 MEQ/50ML IV SOLN
10.0000 meq | Freq: Every day | INTRAVENOUS | Status: AC | PRN
  Administered 2012-12-18 (×4): 10 meq via INTRAVENOUS
  Filled 2012-12-18: qty 200

## 2012-12-18 NOTE — Progress Notes (Addendum)
[address]            [place] [address]          [phone]     2 Days Post-Op  Procedure(s) (LRB): VIDEO BRONCHOSCOPY (Right) VIDEO ASSISTED THORACOSCOPY (VATS)/EMPYEMA (Right) Subjective: Some pain with cough, not using PCA, maximal assist per nursing d/t weakness  Objective  Telemetry sinus, occ pvc   Temp:  [97.1 F (36.2 C)-99.5 F (37.5 C)] 99.5 F (37.5 C) (01/01 0700) Pulse Rate:  [52-72] 66  (01/01 0325) Resp:  [16-28] 21  (01/01 0325) BP: (117-171)/(49-97) 149/49 mmHg (01/01 0325) SpO2:  [99 %-100 %] 100 % (01/01 0325) Arterial Line BP: (128)/(49) 128/49 mmHg (12/31 0900)   Intake/Output Summary (Last 24 hours) at 12/18/12 0817 Last data filed at 12/18/12 0700  Gross per 24 hour  Intake   1950 ml  Output   1735 ml  Net    215 ml       General appearance: alert, cooperative, no distress and weak Heart: regular rate and rhythm Lungs: dim in bases Abdomen: soft, nontender Extremities: pas in place Wound: incisions ok  Lab Results:  Basename 12/18/12 0330 12/17/12 0351  NA 142 140  K 3.2* 3.8  CL 106 105  CO2 27 25  GLUCOSE 156* 133*  BUN 18 23  CREATININE 1.10 1.30  CALCIUM 8.4 8.7  MG -- --  PHOS -- --    Basename 12/18/12 0330 12/15/12 1137  AST 9 6  ALT <5 5  ALKPHOS 68 71  BILITOT 0.2* 0.2*  PROT 4.7* 5.1*  ALBUMIN 1.8* 2.0*   No results found for this basename: LIPASE:2,AMYLASE:2 in the last 72 hours  Basename 12/18/12 0330 12/17/12 0351  WBC 20.8* 25.1*  NEUTROABS -- --  HGB 9.1* 10.0*  HCT 26.9* 29.7*  MCV 84.6 85.8  PLT 470* 455*   No results found for this basename: CKTOTAL:4,CKMB:4,TROPONINI:4 in the last 72 hours No components found with this basename: POCBNP:3 No results found for this basename: DDIMER in the last 72 hours No results found for this basename: HGBA1C in the last 72 hours No results found for this basename: CHOL,HDL,LDLCALC,TRIG,CHOLHDL in the last 72 hours No  results found for this basename: TSH,T4TOTAL,FREET3,T3FREE,THYROIDAB in the last 72 hours No results found for this basename: VITAMINB12,FOLATE,FERRITIN,TIBC,IRON,RETICCTPCT in the last 72 hours  Medications: Scheduled    . albuterol  4 puff Inhalation Q6H  . antiseptic oral rinse  15 mL Mouth Rinse q12n4p  . aspirin EC  81 mg Oral q morning - 10a  . bisacodyl  10 mg Oral Daily  . carbidopa-levodopa  1 tablet Oral TID  . chlorhexidine  15 mL Mouth Rinse BID  . enoxaparin  40 mg Subcutaneous Q24H  . entacapone  200 mg Oral TID  . fentaNYL   Intravenous Q4H  . lamoTRIgine  150 mg Oral BID  . piperacillin-tazobactam  3.375 g Intravenous Q8H  . QUEtiapine  50 mg Oral QHS  . sertraline  150 mg Oral q morning - 10a  . sodium chloride  10-40 mL Intracatheter Q12H  . vancomycin  1,000 mg Intravenous Q12H     Radiology/Studies:  Dg Chest Port 1 View  12/17/2012  *RADIOLOGY REPORT*  Clinical Data: Prior video assisted thoracoscopy or empyema.  Right pleural effusion.  PORTABLE CHEST - 1 VIEW  Comparison: 12/16/2012  Findings: The right-sided chest tubes remain  in place.  Right internal jugular central venous catheter tip projects at the cavoatrial junction.  Right pleural effusion noted with fluid in the minor fissure.  No pneumothorax observed.  Mild airspace opacity at the right lung base is slightly increased, masses airspace opacity in the left lower lobe.  Old left rib fractures noted.  Degenerative glenohumeral arthropathy noted bilaterally.  There is mild cardiomegaly.  IMPRESSION:  1.  Mild increase in bilateral lower lobe airspace opacity. Otherwise stable.   Original Report Authenticated By: Gaylyn Rong, M.D.    Dg Chest Portable 1 View  12/16/2012  *RADIOLOGY REPORT*  Clinical Data: 78 year old male status post VATS and chest tube placement.  Central line placement.  PORTABLE CHEST - 1 VIEW  Comparison: 12/14/2012 and earlier.  Findings: Portable semi upright AP view at 1853  hours.  Right IJ central line in place, tip projects at the lower SVC level.  Three right side chest tube placed with decreased probably layering and loculated right pleural effusion.  No pneumothorax identified. Stable lung volumes.  Stable cardiac size and mediastinal contours. Left lung is clear.  Chronic left lateral rib fractures.  IMPRESSION: 1.  Right IJ central line placed, tip at the lower SVC. 2.  Three right chest tube placed with improved right lung ventilation and no pneumothorax identified.   Original Report Authenticated By: Erskine Speed, M.D.     INR: Will add last result for INR, ABG once components are confirmed Will add last 4 CBG results once components are confirmed  Assessment/Plan: S/P Procedure(s) (LRB): VIDEO BRONCHOSCOPY (Right) VIDEO ASSISTED THORACOSCOPY (VATS)/EMPYEMA (Right)  1 stable, chest tubes without air leak, cxr stable appearance, 210 cc/ 24 hours drainage. Leave CT in place 2 DC pca 3 obtain PT/OT consults as will prob need rehab 4 replace K+ 5 LEUKOCYTOSIS IMPROVING, anemia stable, cultures neg- cont current abx 6 nutrition - swallow dysfunction/NPO, consider feeding tube early as albumin already below 2.0    LOS: 8 days    GOLD,WAYNE E 1/1/20148:17 AM    Speech path has seen today and diet advanced with precautions I have seen and examined Peri Jefferson and agree with the above assessment  and plan.  Delight Ovens MD Beeper [phone] Office [phone] 12/18/2012 12:15 PM

## 2012-12-18 NOTE — Progress Notes (Signed)
Speech Language Pathology Dysphagia Treatment Patient Details Name: [name] MRN: [id number] DOB: Apr 21, 1935 Today's Date: 12/18/2012 Time: 4098-1191 SLP Time Calculation (min): 49 min  Assessment / Plan / Recommendation Clinical Impression  Pt much more alert today, able to participate in therapeutic trials to decrease risk of aspriation. Pt with overt signs of aspiration with thin liquids. Nectar thick liquids/puree do not result in any immediate signs of aspiration, but after several minutes that pt is noted to start expectorating foamy secretions with a tinge of PO. Suspect pharyngeal residuals. SLP provided max verbal tactile cues and model to encourage double swallow to reduce residual with subjective success.   At this time the pt may initate a Dys 2 diet (fine chop) with nectar thick liqudis, avoiding dry, granulated solids such as corn or ground meat. Suggest these foods be mixed with gravy, sauce or moist solids on the tray. Pt may benefit from objective test in the short term if signs of aspiration are observed. Pharyngeal strengthening exercises may also be helpful. Discussed with RN, visual reminders posted in room.     Diet Recommendation  Initiate / Change Diet: Dysphagia 2 (fine chop);Nectar-thick liquid    SLP Plan Continue with current plan of care   Pertinent Vitals/Pain NA   Swallowing Goals  SLP Swallowing Goals Patient will consume recommended diet without observed clinical signs of aspiration with: Minimal cueing Swallow Study Goal #1 - Progress: Progressing toward goal Patient will utilize recommended strategies during swallow to increase swallowing safety with: Minimal cueing Swallow Study Goal #2 - Progress: Progressing toward goal  General Temperature Spikes Noted: No Respiratory Status: Supplemental O2 delivered via (comment) Behavior/Cognition: Cooperative;Alert Oral Cavity - Dentition: Adequate natural dentition Patient Positioning: Upright in  bed  Oral Cavity - Oral Hygiene Does patient have any of the following "at risk" factors?: Other - dysphagia;Diet - patient on thickened liquids Brush patient's teeth BID with toothbrush (using toothpaste with fluoride): Yes Patient is HIGH RISK - Oral Care Protocol followed (see row info): Yes Patient is AT RISK - Oral Care Protocol followed (see row info): Yes   Dysphagia Treatment Treatment focused on: Skilled observation of diet tolerance;Upgraded PO texture trials;Facilitation of pharyngeal phase;Utilization of compensatory strategies Treatment Methods/Modalities: Skilled observation Patient observed directly with PO's: Yes Type of PO's observed: Dysphagia 2 (chopped);Nectar-thick liquids;Thin liquids Feeding: Able to feed self;Needs assist Liquids provided via: Cup Oral Phase Signs & Symptoms: Prolonged oral phase;Prolonged bolus formation;Prolonged mastication Pharyngeal Phase Signs & Symptoms: Suspected delayed swallow initiation;Multiple swallows;Wet vocal quality;Immediate cough Type of cueing: Verbal Amount of cueing: Maximal   GO    [name], MA CCC-SLP [phone]  [name] 12/18/2012, 10:06 AM

## 2012-12-19 ENCOUNTER — Inpatient Hospital Stay (HOSPITAL_COMMUNITY): Payer: Medicare Other

## 2012-12-19 LAB — CULTURE, RESPIRATORY W GRAM STAIN: Culture: NO GROWTH

## 2012-12-19 NOTE — Clinical Documentation Improvement (Signed)
CHANGE MENTAL STATUS DOCUMENTATION CLARIFICATION   THIS DOCUMENT IS NOT A PERMANENT PART OF THE MEDICAL RECORD  TO RESPOND TO THE THIS QUERY, FOLLOW THE INSTRUCTIONS BELOW:  1. If needed, update documentation for the patient's encounter via the notes activity.  2. Access this query again and click edit on the In [employer].  3. After updating, or not, click F2 to complete all highlighted (required) fields concerning your review. Select "additional documentation in the medical record" OR "no additional documentation provided".  4. Click Sign note button.  5. The deficiency will fall out of your In Basket *Please let us know if you are not able to complete this workflow by phone or e-mail (listed below).         12/19/12  Dear Dr. Dorris Fetch, [name]  In an effort to better capture your patient's severity of illness, reflect appropriate length of stay and utilization of resources, a review of the patient medical record has revealed the following indicators.    Based on your clinical judgment, please clarify and document in a progress note and/or discharge summary the clinical condition associated with the following supporting information:  In responding to this query please exercise your independent judgment.  The fact that a query is asked, does not imply that any particular answer is desired or expected.  Based on your clinical judgment can you provide a diagnosis that represents the below listed clinical indicators?  In this patient admitted with HCAP, Empyema necessitating VATS procedure a review of the medical record reveals the following:   Pt experienced "mental status decreased"   Clarification Needed   Please clarify if "mental status decreased" in the setting of HCAP, Empyema, UTI reflects one of the conditions/diagnoses listed below and document in pn and d/c summary   Possible Clinical Conditions?  _______Encephalopathy (describe type if known)          Anoxic                       Septic                       Alcoholic                        Hepatic                       Hypertensive                       Metabolic                       _______ Toxic _______Encephalopathy, Resolved _______Delirium, Resolved _______Delirium _______Other Condition__________________ ___X____Cannot Clinically Determine   Supporting Information:  Risk Factors: HCAP, Parapneumonic effusion, Breast cancer, Bipolar, UTI (MRSA/pseudomonas), Pyuria, Empyema, Poss TB   Signs & Symptoms:  Diagnostics: Lab: Component     Latest Ref Rng 12/10/2012 12/11/2012         12:15 PM  WBC     4.0 - 10.5 K/uL 21.7 (H) 24.4 (H)   Component     Latest Ref Rng 12/10/2012 12/11/2012         12:15 PM  Neutrophils Relative     43 - 77 % 92 (H) 91 (H)   Component     Latest Ref Rng 12/12/2012 12/14/2012 12/15/2012 12/15/2012           5:17  AM 11:37 AM  WBC     4.0 - 10.5 K/uL 19.7 (H) 16.0 (H) 17.6 (H) 20.3 (H)   Component     Latest Ref Rng 12/10/2012          Nitrite     NEGATIVE POSITIVE (A)  Leukocytes, UA     NEGATIVE LARGE (A)   Component     Latest Ref Rng 12/10/2012          Colony Count      >=100,000 COLONIES/ML  Culture      METHICILLIN RESISTANT STAPHYLOCOCCUS AUREUS . . .  Report Status      12/13/2012 FINAL  Organism ID, Bacteria      METHICILLIN RESISTANT STAPHYLOCOCCUS AUREUS   Component     Latest Ref Rng 12/10/2012          WBC, UA     <3 WBC/hpf TOO NUMEROUS TO COUNT  RBC / HPF     <3 RBC/hpf 11-20  Bacteria, UA     RARE MANY (A)  Urine-Other      FEW YEAST   Component     Latest Ref Rng 12/18/2012          Hemoglobin     13.0 - 17.0 g/dL 9.1 (L)  HCT     45.4 - 52.0 % 26.9 (L)   CT Chest w/contrast 12/10/12 IMPRESSION: 1.  Consolidation of the right upper lobe.  Collapse of the right lower lobe secondary to moderate to large right pleural effusion. Findings are most compatible with right upper  lobe pneumonia with parapneumonic effusion.  Underlying neoplasm is not excluded. Follow-up to ensure radiographic clearing recommended.  Clearing is usually observed at 8 weeks. 2.  Cardiomegaly. 3.  Ascending aortic aneurysm measuring 44 mm.  No acute abnormalities.  Radiology: Treatment:  Monitoring  Pt had VATS procedure  Vancomycin  Order for blood transfusion  Reviewed:  no additional documentation provided  Thank You,  Enis Slipper  RN, BSN, MSN/Inf, CCDS Clinical Documentation Specialist [facility] Dept Pager: [phone] / E-mail: [email or web address]  Health Information Management [place]

## 2012-12-19 NOTE — Evaluation (Signed)
Occupational Therapy Evaluation Patient Details Name: [name] MRN: [id number] DOB: 1935-07-22 Today's Date: 12/19/2012 Time: 4098-1191 OT Time Calculation (min): 30 min  OT Assessment / Plan / Recommendation Clinical Impression  [name] is a 77y/o male with h/o PD and recent fall with c2 fx. He also has bipolar disorder, HTN, chronic indwelling cathether and frequent UTIs. Pt admitted after fall at ALF c/o CP and is now s/p Rt VATS. Pt presents as generally weak/deconditioned and will benefit from skilled OT in the acute setting to maximize I with ADL and ADL mobility prior to d/c. Recommend d/c to SNF for rehab prior to return to ALF.    OT Assessment  Patient needs continued OT Services    Follow Up Recommendations  SNF    Barriers to Discharge      Equipment Recommendations  None recommended by OT    Recommendations for Other Services    Frequency  Min 2X/week    Precautions / Restrictions Precautions Precautions: Fall Precaution Comments: right chest tube Restrictions Weight Bearing Restrictions: No   Pertinent Vitals/Pain Pt reports "little" right side pain due to chest tube. Pt repositioned for pain relief.    ADL  Grooming: Min guard;Wash/dry face Where Assessed - Grooming: Supported sitting Upper Body Bathing: Moderate assistance (to avoid chest tube) Where Assessed - Upper Body Bathing: Supported sitting Lower Body Bathing: Maximal assistance Where Assessed - Lower Body Bathing: Supported sit to stand Lower Body Dressing: +2 Total assistance Lower Body Dressing: Patient Percentage: 20% Where Assessed - Lower Body Dressing: Supported sit to Pharmacist, hospital: +2 Total assistance Toilet Transfer: Patient Percentage: 40% Toilet Transfer Method: Sit to stand;Stand pivot Acupuncturist: Materials engineer and Hygiene: +2 Total assistance Toileting - Clothing Manipulation and Hygiene: Patient Percentage:  20% Where Assessed - Toileting Clothing Manipulation and Hygiene: Sit to stand from 3-in-1 or toilet Transfers/Ambulation Related to ADLs: +2total(pt=40%) sit to stand and stand pivot from bed to chair. VC for upright posture and to take small steps to chair. Assist needed to weight shift for steps    OT Diagnosis: Generalized weakness;Acute pain;Cognitive deficits  OT Problem List: Decreased strength;Decreased activity tolerance;Impaired balance (sitting and/or standing);Decreased cognition;Decreased knowledge of use of DME or AE;Decreased knowledge of precautions OT Treatment Interventions: Self-care/ADL training;DME and/or AE instruction;Therapeutic activities;Cognitive remediation/compensation;Patient/family education   OT Goals Acute Rehab OT Goals OT Goal Formulation: With patient Time For Goal Achievement: 01/02/13 Potential to Achieve Goals: Good ADL Goals Pt Will Perform Grooming: with min assist;Standing at sink ADL Goal: Grooming - Progress: Goal set today Pt Will Perform Upper Body Dressing: with set-up;Sitting, chair;Sitting, bed ADL Goal: Upper Body Dressing - Progress: Goal set today Pt Will Perform Lower Body Dressing: with min assist;Sit to stand from chair;Sit to stand from bed ADL Goal: Lower Body Dressing - Progress: Goal set today Pt Will Transfer to Toilet: with min assist;Ambulation;Stand pivot transfer;with DME ADL Goal: Toilet Transfer - Progress: Goal set today Pt Will Perform Toileting - Clothing Manipulation: Standing;with min assist ADL Goal: Toileting - Clothing Manipulation - Progress: Goal set today Pt Will Perform Toileting - Hygiene: Sit to stand from 3-in-1/toilet;Sitting on 3-in-1 or toilet;with min assist ADL Goal: Toileting - Hygiene - Progress: Goal set today Additional ADL Goal #1: Pt will be Mod I with all aspects of bed mobility in prep for OOB functional activities. ADL Goal: Additional Goal #1 - Progress: Goal set today  Visit Information   Last OT Received On: 12/19/12  Assistance Needed: +2    Subjective Data  Subjective: "I don't think you're satisfied with my answers" Patient Stated Goal: Return to [facility]   Prior Functioning     Home Living Type of Home: Assisted living Home Access: Level entry Home Layout: One level Additional Comments: Per chart, pt living at [facility] PTA. Unsure of available level of assist at d/c Prior Function Level of Independence: Needs assistance Needs Assistance: Bathing;Dressing Comments:   pt unable to give consistent PLOF  Communication Communication: No difficulties         Vision/Perception Vision - Assessment Additional Comments: did not assess   Cognition  Overall Cognitive Status: Impaired Area of Impairment: Memory;Problem solving;Awareness of errors Arousal/Alertness: Awake/alert Orientation Level: Appears intact for tasks assessed Behavior During Session: [facility] for tasks performed Memory:  (Pt continues to repeat self and ask same questions.) Memory Deficits: decreased recall of situation as well as PLOF and place of living Problem Solving: pt slow to process and needs extra time to complete task.    Extremity/Trunk Assessment Right Upper Extremity Assessment RUE ROM/Strength/Tone: Deficits RUE ROM/Strength/Tone Deficits: ROM WFL. generally weak (4 to 4+/5) Left Upper Extremity Assessment LUE ROM/Strength/Tone: Deficits LUE ROM/Strength/Tone Deficits: ROM WFL. generally weak (4 to 4+/5) Right Lower Extremity Assessment RLE ROM/Strength/Tone: Deficits RLE ROM/Strength/Tone Deficits: ROM WFL. generally weak (4 to 4+/5) Left Lower Extremity Assessment LLE ROM/Strength/Tone: Deficits LLE ROM/Strength/Tone Deficits: ROM WFL. generally weak (4 to 4+/5)     Mobility Bed Mobility Bed Mobility: Sitting - Scoot to Edge of Bed;Supine to Sit Supine to Sit: With rails;2: Max assist Sitting - Scoot to Edge of Bed: 3: Mod assist Details for Bed Mobility  Assistance: VC for sequencing and hand placement. assist needed to bring LE off EOB, reach for rail and come into sitting. Assist with pad to scoot to EOB     Shoulder Instructions     Exercise     Balance     End of Session OT - End of Session Activity Tolerance: Patient tolerated treatment well Patient left: in chair;with call bell/phone within reach Nurse Communication: Mobility status  GO     Katlin Ciszewski 12/19/2012, 11:31 AM

## 2012-12-19 NOTE — Procedures (Signed)
Objective Swallowing Evaluation: Modified Barium Swallowing Study  Patient Details  Name: [name] MRN: [id number] Date of Birth: May 27, 1935  Today's Date: 12/19/2012 Time: 1330-1405 SLP Time Calculation (min): 35 min  Past Medical History:  Past Medical History  Diagnosis Date  . Hearing aid worn   . Hypertension   . Parkinson disease   . Pneumonia   . Diabetes mellitus type II   . Arthritis   . Bipolar disorder   . Anxiety   . Depression   . MRSA infection (methicillin-resistant Staphylococcus aureus)   . Chronic indwelling foley catheter   . C2 cervical fracture     due to pt fall  . SIRS (systemic inflammatory response syndrome)   . Neuromuscular disorder    Past Surgical History:  Past Surgical History  Procedure Date  . Total hip arthroplasty   . Video bronchoscopy 12/16/2012    Procedure: VIDEO BRONCHOSCOPY;  Surgeon: Loreli Slot, MD;  Location: [facility] OR;  Service: Thoracic;  Laterality: Right;  . Video assisted thoracoscopy (vats)/empyema 12/16/2012    Procedure: VIDEO ASSISTED THORACOSCOPY (VATS)/EMPYEMA;  Surgeon: Loreli Slot, MD;  Location: [facility] OR;  Service: Thoracic;  Laterality: Right;   HPI:  [name] is a 78 year old gentleman with multiple  medical issues including Parkinson disease and bipolar disorder. He recently felt a persistent cough. He was treated with Mucinex. He presented to the emergency room after a fall and a chest x-ray showed right lung consolidation and an effusion. A CT of the chest was done which showed pleural effusion with compressive atelectasis. Thoracentesis was performed and the fluid was consistent with an exudate, likely an empyema. He initially improved after thoracentesis but had rapid reaccumulation of the fluid and was advised to undergo bronchoscopy and right video-assisted thoracoscopy for drainage of effusion and possible decortication. Bronchoscopy revealed foreign body in the posterior segmental bronchus of  right upper lobe, a piece of corn. Decoritcation completed with good reexpansion of lung.      Assessment / Plan / Recommendation Clinical Impression  Dysphagia Diagnosis: Mild oral phase dysphagia;Moderate pharyngeal phase dysphagia Clinical impression: Patient exhibits a mild oral and moderate sensori-motor pharyngeal dysphagia, characterized by slow mastication and oral transit with solids, and significantly delayed swallow initiation (to level of the pyriform sinuses with liquids0.  Patient required verbal cues to initiate the first swallow of liquid, stating that he had already swallowed, when he had not, and the liquid was pooled precariously in the valleculae and pyriforms.  Patient also experienced penetration of liquids (thin and nectar) into the laryngeal vestibule during the swallow due to inadequate anterior hyoid movement and reduced laryngeal elevation, as well as reduced epiglottice deflection during the swallow.  Patient did not sense the penetrated material and, therefore, did not spontaneously cough  There was moderate residue in the valleculae and mild reidue in the pyriforms after the swallow.  When patient used a chin tuck head position, there was no penetration, and swallowing twice with each bite/sip reduced the residue.  Patient will need full supervision and moderate verbal cues (likely with each swallow) to follow these strategies and reduce aspiration risks..      Treatment Recommendation  Therapy as outlined in treatment plan below    Diet Recommendation Dysphagia 3 (Mechanical Soft);Thin liquid (With chopped meats)   Liquid Administration via: Cup;No straw Medication Administration: Whole meds with puree Supervision: Staff feed patient;Full supervision/cueing for compensatory strategies;Trained caregiver to feed patient Compensations: Slow rate;Small sips/bites;Multiple dry swallows after each bite/sip  Postural Changes and/or Swallow Maneuvers: Seated upright 90  degrees;Chin tuck    Other  Recommendations Oral Care Recommendations: Oral care QID Other Recommendations: Clarify dietary restrictions;Have oral suction available   Follow Up Recommendations  Home health SLP;24 hour supervision/assistance    Frequency and Duration min 2x/week  2 weeks   Pertinent Vitals/Pain N/A    SLP Swallow Goals Patient will consume recommended diet without observed clinical signs of aspiration with: Moderate assistance;Supervision/safety Patient will utilize recommended strategies during swallow to increase swallowing safety with: Moderate assistance   General HPI: [name] is a 78 year old gentleman with multiple  medical issues including Parkinson disease and bipolar disorder. He recently felt a persistent cough. He was treated with Mucinex. He presented to the emergency room after a fall and a chest x-ray showed right lung consolidation and an effusion. A CT of the chest was done which showed pleural effusion with compressive atelectasis. Thoracentesis was performed and the fluid was consistent with an exudate, likely an empyema. He initially improved after thoracentesis but had rapid reaccumulation of the fluid and was advised to undergo bronchoscopy and right video-assisted thoracoscopy for drainage of effusion and possible decortication. Bronchoscopy revealed foreign body in the posterior segmental bronchus of right upper lobe, a piece of corn. Decoritcation completed with good reexpansion of lung.  Type of Study: Modified Barium Swallowing Study Reason for Referral: Objectively evaluate swallowing function Previous Swallow Assessment: MBS 08/15/12 - Pt. demonstrated mod oral dysphagia and mild sensory-motor pharyngeal dysphagia.  Oral prep and masitcation of cracker was functional with delayed transit to posterior oral cavity.  Multiple verbal cues provided to move cracker back and initiate swallow.  Sensory deficits resulted in delayed swallow initiation.   Lingual residue spilled to valleculae and pyriform sinuses post swallow (moderate amount) with no sensation or volitional swallow to clear from pt.  Verbal and visual cues were mild-moderately effective in reducing stasis.  Completed airway protection present without penetration or aspiration.  He is at higer risk for aspiration from residuals.  Recommend a Dys 3 diet and thin liquids, full supervision, straws ok as long as sips are single and controlled, pills whole in applesauce. Diet Prior to this Study: Dysphagia 2 (chopped);Nectar-thick liquids Temperature Spikes Noted: No Respiratory Status: Room air History of Recent Intubation: No Behavior/Cognition: Cooperative;Alert Oral Cavity - Dentition: Adequate natural dentition Oral Motor / Sensory Function: Within functional limits Self-Feeding Abilities: Needs assist;Needs set up Patient Positioning: Upright in chair Baseline Vocal Quality: Clear Volitional Cough: Strong Volitional Swallow: Unable to elicit Anatomy: Within functional limits Pharyngeal Secretions: Not observed secondary MBS    Reason for Referral Objectively evaluate swallowing function   Oral Phase Oral Preparation/Oral Phase Oral Phase: Impaired Oral - Solids Oral - Regular: Delayed oral transit   Pharyngeal Phase Pharyngeal Phase Pharyngeal Phase: Impaired Pharyngeal - Nectar Pharyngeal - Nectar Teaspoon: Delayed swallow initiation;Premature spillage to pyriform sinuses;Reduced pharyngeal peristalsis;Reduced epiglottic inversion;Reduced anterior laryngeal mobility;Reduced laryngeal elevation;Reduced tongue base retraction;Penetration/Aspiration during swallow;Pharyngeal residue - valleculae;Pharyngeal residue - pyriform sinuses Pharyngeal - Thin Pharyngeal - Thin Teaspoon: Delayed swallow initiation;Premature spillage to pyriform sinuses;Premature spillage to valleculae;Reduced epiglottic inversion;Reduced anterior laryngeal mobility;Reduced laryngeal elevation;Reduced  tongue base retraction;Penetration/Aspiration during swallow;Pharyngeal residue - valleculae;Pharyngeal residue - pyriform sinuses Pharyngeal - Thin Cup: Delayed swallow initiation;Premature spillage to valleculae;Premature spillage to pyriform sinuses;Reduced pharyngeal peristalsis;Reduced epiglottic inversion;Reduced anterior laryngeal mobility;Reduced laryngeal elevation;Reduced tongue base retraction;Penetration/Aspiration during swallow;Pharyngeal residue - valleculae;Pharyngeal residue - pyriform sinuses Pharyngeal - Solids Pharyngeal - Puree: Delayed swallow initiation;Premature spillage to valleculae;Reduced  pharyngeal peristalsis;Reduced epiglottic inversion;Reduced anterior laryngeal mobility;Reduced laryngeal elevation;Reduced tongue base retraction;Pharyngeal residue - valleculae Pharyngeal - Mechanical Soft: Delayed swallow initiation;Premature spillage to valleculae;Reduced pharyngeal peristalsis;Reduced epiglottic inversion;Reduced anterior laryngeal mobility;Reduced laryngeal elevation;Reduced tongue base retraction;Pharyngeal residue - valleculae  Cervical Esophageal Phase    GO    Cervical Esophageal Phase Cervical Esophageal Phase: Vicente Masson T 12/19/2012, 3:35 PM

## 2012-12-19 NOTE — Progress Notes (Signed)
Clinical Child psychotherapist (CSW) to remain following. CSW to facilitate with dc to [name] when pt is medically stable.  Theresia Bough, MSW, Theresia Majors [phone]

## 2012-12-19 NOTE — Progress Notes (Addendum)
[address]            [place] [address]          [phone]     3 Days Post-Op  Procedure(s) (LRB): VIDEO BRONCHOSCOPY (Right) VIDEO ASSISTED THORACOSCOPY (VATS)/EMPYEMA (Right) Subjective: Feels a little better  Objective  Telemetry sinus rhythm  Temp:  [97.8 F (36.6 C)-98.9 F (37.2 C)] 98.6 F (37 C) (01/02 0300) Pulse Rate:  [59-70] 64  (01/02 0300) Resp:  [19-23] 20  (01/02 0300) BP: (132-149)/(66-72) 132/66 mmHg (01/02 0300) SpO2:  [90 %-96 %] 95 % (01/02 0300) FiO2 (%):  [30 %] 30 % (01/01 2357)   Intake/Output Summary (Last 24 hours) at 12/19/12 0820 Last data filed at 12/19/12 0608  Gross per 24 hour  Intake 1412.67 ml  Output   1866 ml  Net -453.33 ml       General appearance: alert, cooperative and no distress Heart: regular rate and rhythm Lungs: crackles in bases Abdomen: soft, nontender Extremities: no edema Wound: incis ok  Lab Results:  Basename 12/18/12 0330 12/17/12 0351  NA 142 140  K 3.2* 3.8  CL 106 105  CO2 27 25  GLUCOSE 156* 133*  BUN 18 23  CREATININE 1.10 1.30  CALCIUM 8.4 8.7  MG -- --  PHOS -- --    Basename 12/18/12 0330  AST 9  ALT <5  ALKPHOS 68  BILITOT 0.2*  PROT 4.7*  ALBUMIN 1.8*   No results found for this basename: LIPASE:2,AMYLASE:2 in the last 72 hours  Basename 12/18/12 0330 12/17/12 0351  WBC 20.8* 25.1*  NEUTROABS -- --  HGB 9.1* 10.0*  HCT 26.9* 29.7*  MCV 84.6 85.8  PLT 470* 455*   No results found for this basename: CKTOTAL:4,CKMB:4,TROPONINI:4 in the last 72 hours No components found with this basename: POCBNP:3 No results found for this basename: DDIMER in the last 72 hours No results found for this basename: HGBA1C in the last 72 hours No results found for this basename: CHOL,HDL,LDLCALC,TRIG,CHOLHDL in the last 72 hours No results found for this basename: TSH,T4TOTAL,FREET3,T3FREE,THYROIDAB in the last 72 hours No results found for this basename:  VITAMINB12,FOLATE,FERRITIN,TIBC,IRON,RETICCTPCT in the last 72 hours  Medications: Scheduled    . antiseptic oral rinse  15 mL Mouth Rinse q12n4p  . aspirin EC  81 mg Oral q morning - 10a  . bisacodyl  10 mg Oral Daily  . carbidopa-levodopa  1 tablet Oral TID  . chlorhexidine  15 mL Mouth Rinse BID  . enoxaparin  40 mg Subcutaneous Q24H  . entacapone  200 mg Oral TID  . lamoTRIgine  150 mg Oral BID  . piperacillin-tazobactam  3.375 g Intravenous Q8H  . QUEtiapine  50 mg Oral QHS  . sertraline  150 mg Oral q morning - 10a  . sodium chloride  10-40 mL Intracatheter Q12H  . vancomycin  1,000 mg Intravenous Q12H     Radiology/Studies:  Dg Chest Port 1 View  12/18/2012  *RADIOLOGY REPORT*  Clinical Data: Chest tubes.  Cough.  Empyema.  PORTABLE CHEST - 1 VIEW  Comparison: 1 day prior  Findings: Right IJ central line unchanged.  2 right chest tubes are similar in position.  Remote left rib trauma.  Mild cardiomegaly. Right-sided pleural fluid/thickening is similar. No pneumothorax.  Mild left base volume loss.  Patchy right-sided airspace disease with fluid in the right minor fissure.  IMPRESSION:  1. No significant change since one day prior. 2.  Right-sided chest tubes remaining in place with right-sided pleural fluid/thickening, but no pneumothorax. 3.  Similar right-sided airspace disease.   Original Report Authenticated By: Jeronimo Greaves, M.D.     INR: Will add last result for INR, ABG once components are confirmed Will add last 4 CBG results once components are confirmed   Chest tube- No air leak noted. 140 cc/24 hours  Assessment/Plan: S/P Procedure(s) (LRB): VIDEO BRONCHOSCOPY (Right) VIDEO ASSISTED THORACOSCOPY (VATS)/EMPYEMA (Right)  1. Clinically progressing 2 Poss begin to remove tubes 3 cultures neg- cont current abx 4 push rehab as able/pulm toilet  LOS: 9 days    GOLD,WAYNE E 1/2/20148:20 AM    Severe protein calorie malnutrition- push PO intake and  supplements D/c anterior CT

## 2012-12-19 NOTE — Progress Notes (Signed)
Physical Therapy Evaluation  12/19/12 1200  PT Visit Information  Last PT Received On 12/19/12  Assistance Needed +2  PT/OT Co-Evaluation/Treatment Yes  PT Time Calculation  PT Start Time 0956  PT Stop Time 1024  PT Time Calculation (min) 28 min  Subjective Data  Subjective "Does medicare pay for this?"  Patient Stated Goal Did not set  Precautions  Precautions Fall  Precaution Comments right chest tube  Restrictions  Weight Bearing Restrictions No  Cognition  Overall Cognitive Status Impaired  Area of Impairment Memory;Problem solving;Awareness of errors  Arousal/Alertness Awake/alert  Orientation Level Appears intact for tasks assessed  Behavior During Session [facility] for tasks performed  Memory (Pt continues to repeat self and ask same questions.)  Memory Deficits decreased recall of situation as well as PLOF and place of living  Problem Solving pt slow to process and needs extra time to complete task.  Bed Mobility  Bed Mobility Sitting - Scoot to Edge of Bed;Supine to Sit  Supine to Sit With rails;2: Max assist  Sitting - Scoot to Edge of Bed 3: Mod assist  Details for Bed Mobility Assistance VC for sequencing and hand placement. assist needed to bring LE off EOB, reach for rail and come into sitting. Assist with pad to scoot to EOB  Transfers  Transfers Sit to Stand;Stand to Sit;Stand Pivot Transfers  Sit to Stand 1: +2 Total assist;From bed  Sit to Stand: Patient Percentage 40%  Stand to Sit 1: +2 Total assist;To chair/3-in-1  Stand to Sit: Patient Percentage 40%  Stand Pivot Transfers 1: +2 Total assist  Stand Pivot Transfers: Patient Percentage 50%  Details for Transfer Assistance +2 (A) to initiate transfer and slowly descend to recliner with max cues for hand and LE placement.  Pt needs +2 (A) to maintain balance and upright posture with max cues for LE placement during stand pivot transfer.  Ambulation/Gait  Ambulation/Gait Assistance Not tested (comment)  Stairs No   Wheelchair Mobility  Wheelchair Mobility No  PT - End of Session  Equipment Utilized During Treatment Gait belt;Oxygen (2L)  Activity Tolerance Patient tolerated treatment well  Patient left in chair;with call bell/phone within reach  Nurse Communication Mobility status  PT - Assessment/Plan  Comments on Treatment Session Pt is a 78y/o male with h/o PD and recent fall with c2 fx. He also has bipolar disorder, HTN, chronic indwelling cathether and frequent UTIs. Pt admitted after fall at ALF c/o CP and is now s/p Rt VATS.  Pt will benefit from acute PT services to improve overall mobility and prepare for safe d/c to next venue.   PT Plan Discharge plan remains appropriate;Frequency remains appropriate  PT Frequency Min 3X/week  Follow Up Recommendations SNF  PT equipment None recommended by PT  Acute Rehab PT Goals  PT Goal Formulation With patient  Time For Goal Achievement 01/02/13  Potential to Achieve Goals Good  Pt will go Supine/Side to Sit with supervision  PT Goal: Supine/Side to Sit - Progress Goal set today  Pt will Sit at [facility] of Bed with supervision;1-2 min  PT Goal: Sit at Edge Of Bed - Progress Goal set today  Pt will go Sit to Supine/Side with supervision  PT Goal: Sit to Supine/Side - Progress Goal set today  Pt will go Sit to Stand with min assist  PT Goal: Sit to Stand - Progress Goal set today  Pt will go Stand to Sit with min assist  PT Goal: Stand to Sit - Progress Goal  set today  Pt will Transfer Bed to Chair/Chair to Bed with min assist  PT Transfer Goal: Bed to Chair/Chair to Bed - Progress Goal set today  Pt will Stand with supervision;1 - 2 min;with bilateral upper extremity support  PT Goal: Stand - Progress Goal set today  Pt will Ambulate 1 - 15 feet;with rolling walker;with +2 total assist  PT Goal: Ambulate - Progress Goal set today  PT General Charges  $$ ACUTE PT VISIT 1 Procedure  PT Evaluation  $Initial PT Evaluation Tier I 1 Procedure  PT  Treatments  $Therapeutic Activity 23-37 mins    No c/o pain  [place], PT DPT [phone]

## 2012-12-19 NOTE — Progress Notes (Signed)
ANTIBIOTIC CONSULT NOTE - FOLLOW UP  Pharmacy Consult for vancomycin Indication: HCAP and UTI  Allergies  Allergen Reactions  . Cholestatin Other (See Comments)    Per [facility]    Patient Measurements: Height: 5\' 7"  (170.2 cm) Weight: 153 lb 10.6 oz (69.7 kg) (standing weight scale) IBW/kg (Calculated) : 66.1  Adjusted Body Weight:   Labs:  Basename 12/18/12 0330 12/17/12 0351  WBC 20.8* 25.1*  HGB 9.1* 10.0*  PLT 470* 455*  LABCREA -- --  CREATININE 1.10 1.30   Estimated Creatinine Clearance: 54.2 ml/min (by C-G formula based on Cr of 1.1). No results found for this basename: VANCOTROUGH:2,VANCOPEAK:2,VANCORANDOM:2,GENTTROUGH:2,GENTPEAK:2,GENTRANDOM:2,TOBRATROUGH:2,TOBRAPEAK:2,TOBRARND:2,AMIKACINPEAK:2,AMIKACINTROU:2,AMIKACIN:2, in the last 72 hours   Microbiology: Recent Results (from the past 720 hour(s))  URINE CULTURE     Status: Normal   Collection Time   12/10/12  6:30 PM      Component Value Range Status Comment   Specimen Description URINE, CLEAN CATCH   Final    Special Requests NONE   Final    Culture  Setup Time 12/11/2012 02:29   Final    Colony Count >=100,000 COLONIES/ML   Final    Culture     Final    Value: METHICILLIN RESISTANT STAPHYLOCOCCUS AUREUS     27 Note: RIFAMPIN AND GENTAMICIN SHOULD NOT BE USED AS SINGLE DRUGS FOR TREATMENT OF STAPH INFECTIONS. CRITICAL RESULT CALLED TO, READ BACK BY AND VERIFIED WITH: CYNTHIA WHITE ON 12 13 @ 1240 BY FERGK   Report Status 12/13/2012 FINAL   Final    Organism ID, Bacteria METHICILLIN RESISTANT STAPHYLOCOCCUS AUREUS   Final   MRSA PCR SCREENING     Status: Normal   Collection Time   12/10/12 10:53 PM      Component Value Range Status Comment   MRSA by PCR NEGATIVE  NEGATIVE Final   CULTURE, BLOOD (ROUTINE X 2)     Status: Normal   Collection Time   12/11/12  2:00 PM      Component Value Range Status Comment   Specimen Description BLOOD RIGHT ARM   Final    Special Requests BOTTLES DRAWN AEROBIC AND ANAEROBIC  [facility]   Final    Culture  Setup Time 12/12/2012 00:25   Final    Culture NO GROWTH 5 DAYS   Final    Report Status 12/18/2012 FINAL   Final   CULTURE, BLOOD (ROUTINE X 2)     Status: Normal   Collection Time   12/11/12  2:07 PM      Component Value Range Status Comment   Specimen Description BLOOD RIGHT ARM   Final    Special Requests BOTTLES DRAWN AEROBIC AND ANAEROBIC 10CC   Final    Culture  Setup Time 12/12/2012 00:25   Final    Culture NO GROWTH 5 DAYS   Final    Report Status 12/18/2012 FINAL   Final   BODY FLUID CULTURE     Status: Normal   Collection Time   12/12/12  2:22 PM      Component Value Range Status Comment   Specimen Description PLEURAL   Final    Special Requests Normal   Final    Gram Stain     Final    Value: RARE WBC PRESENT,BOTH PMN AND MONONUCLEAR     NO ORGANISMS SEEN   Culture NO GROWTH 3 DAYS   Final    Report Status 12/15/2012 FINAL   Final   CULTURE, RESPIRATORY     Status: Normal (Preliminary  result)   Collection Time   12/16/12  3:53 PM      Component Value Range Status Comment   Specimen Description BRONCHIAL WASHINGS RIGHT   Final    Special Requests PATIENT ON FOLLOWING ZOSYN,VANCOMYCIN   Final    Gram Stain     Final    Value: MODERATE WBC PRESENT, PREDOMINANTLY PMN     RARE SQUAMOUS EPITHELIAL CELLS PRESENT     NO ORGANISMS SEEN   Culture NO GROWTH 1 DAY   Final    Report Status PENDING   Incomplete   BODY FLUID CULTURE     Status: Normal (Preliminary result)   Collection Time   12/16/12  4:33 PM      Component Value Range Status Comment   Specimen Description FLUID PLEURAL RIGHT   Final    Special Requests PATIENT ON FOLLOWING VANCOMYCIN,ZOSYN   Final    Gram Stain     Final    Value: MODERATE WBC PRESENT,BOTH PMN AND MONONUCLEAR     NO ORGANISMS SEEN   Culture NO GROWTH 2 DAYS   Final    Report Status PENDING   Incomplete   FUNGUS CULTURE W SMEAR     Status: Normal (Preliminary result)   Collection Time   12/16/12  4:33 PM       Component Value Range Status Comment   Specimen Description FLUID PLEURAL RIGHT   Final    Special Requests PATIENT ON FOLLOWING VANCOMYCIN,ZOSYN   Final    Fungal Smear NO YEAST OR FUNGAL ELEMENTS SEEN   Final    Culture CULTURE IN PROGRESS FOR FOUR WEEKS   Final    Report Status PENDING   Incomplete   AFB CULTURE WITH SMEAR     Status: Normal (Preliminary result)   Collection Time   12/16/12  4:33 PM      Component Value Range Status Comment   Specimen Description FLUID PLEURAL RIGHT   Final    Special Requests PATIENT ON FOLLOWING VANCOMYCIN,ZOSYN   Final    ACID FAST SMEAR NO ACID FAST BACILLI SEEN   Final    Culture     Final    Value: CULTURE WILL BE EXAMINED FOR 6 WEEKS BEFORE ISSUING A FINAL REPORT   Report Status PENDING   Incomplete     Anti-infectives     Start     Dose/Rate Route Frequency Ordered Stop   12/10/12 2200   piperacillin-tazobactam (ZOSYN) IVPB 3.375 g        3.375 g 12.5 mL/hr over 240 Minutes Intravenous 3 times per day 12/10/12 2139     12/10/12 2200   vancomycin (VANCOCIN) IVPB 1000 mg/200 mL premix        1,000 mg 200 mL/hr over 60 Minutes Intravenous Every 12 hours 12/10/12 2147     12/10/12 1945   cefTRIAXone (ROCEPHIN) 1 g in dextrose 5 % 50 mL IVPB  Status:  Discontinued        1 g 100 mL/hr over 30 Minutes Intravenous Every 24 hours 12/10/12 1937 12/10/12 2139   12/10/12 1945   azithromycin (ZITHROMAX) tablet 500 mg        500 mg Oral  Once 12/10/12 1937 12/10/12 2019         Assessment: Continues on Vancomycin and Zosyn for HCAP and MRSA UTI. S/p VATS 12/31  Last vancomycin trough therapeutic  Goal of Therapy:  Vancomycin trough level 15-20 mcg/ml  Plan:  1) Continue Zosyn 3.375 grams - 4 hr  infusion 2) Continue Vancomycin 1000 mg iv Q 12 hours 3) Follow up plan, Scr  Thank you. Okey Regal, PharmD [phone]  12/19/2012,9:28 AM

## 2012-12-19 NOTE — Progress Notes (Signed)
Patient's right anterior CT removed per MD order per policy, patient tolerated well.  Patient still has 2 CT's to the right flank to 20 cm of suction.  Will continue to monitor.

## 2012-12-20 ENCOUNTER — Inpatient Hospital Stay (HOSPITAL_COMMUNITY): Payer: Medicare Other

## 2012-12-20 LAB — CBC
HCT: 27.7 % — ABNORMAL LOW (ref 39.0–52.0)
Hemoglobin: 8.9 g/dL — ABNORMAL LOW (ref 13.0–17.0)
MCH: 27.6 pg (ref 26.0–34.0)
MCHC: 32.1 g/dL (ref 30.0–36.0)
RDW: 15.1 % (ref 11.5–15.5)

## 2012-12-20 LAB — BASIC METABOLIC PANEL
BUN: 16 mg/dL (ref 6–23)
Creatinine, Ser: 1.3 mg/dL (ref 0.50–1.35)
GFR calc non Af Amer: 52 mL/min — ABNORMAL LOW (ref >90)
Glucose, Bld: 111 mg/dL — ABNORMAL HIGH (ref 70–99)
Potassium: 3.3 mEq/L — ABNORMAL LOW (ref 3.5–5.1)

## 2012-12-20 LAB — BODY FLUID CULTURE

## 2012-12-20 LAB — TYPE AND SCREEN
DAT, IgG: NEGATIVE
Donor AG Type: NEGATIVE
PT AG Type: NEGATIVE
Unit division: 0

## 2012-12-20 MED ORDER — POTASSIUM CHLORIDE 10 MEQ/50ML IV SOLN
10.0000 meq | INTRAVENOUS | Status: AC | PRN
  Administered 2012-12-20 (×3): 10 meq via INTRAVENOUS
  Filled 2012-12-20: qty 100

## 2012-12-20 MED ORDER — POTASSIUM CHLORIDE 10 MEQ/50ML IV SOLN
INTRAVENOUS | Status: AC
  Administered 2012-12-20: 10 meq via INTRAVENOUS
  Filled 2012-12-20: qty 50

## 2012-12-20 NOTE — Progress Notes (Addendum)
                    [address]            [place] [address]          [phone]     4 Days Post-Op Procedure(s) (LRB): VIDEO BRONCHOSCOPY (Right) VIDEO ASSISTED THORACOSCOPY (VATS)/EMPYEMA (Right)  Subjective: Comfortable, no complaints.   Objective: Vital signs in last 24 hours: Patient Vitals for the past 24 hrs:  BP Temp Temp src Pulse Resp SpO2  12/20/12 0340 160/77 mmHg 98.5 F (36.9 C) Oral 59  16  93 %  12/20/12 0017 141/64 mmHg 97.9 F (36.6 C) Oral 73  22  94 %  12/19/12 2000 137/59 mmHg 97.8 F (36.6 C) Oral 66  22  94 %  12/19/12 1600 132/68 mmHg 97.5 F (36.4 C) Oral 63  19  97 %  12/19/12 1200 128/71 mmHg 97.3 F (36.3 C) Oral 60  22  94 %   Current Weight  12/17/12 153 lb 10.6 oz (69.7 kg)     Intake/Output from previous day: 01/02 0701 - 01/03 0700 In: 1340 [I.V.:440; IV Piggyback:900] Out: 1600 [Urine:1500; Chest Tube:100]    PHYSICAL EXAM:  Heart: RRR Lungs: Slightly decreased BS in R base Wound:Dressed and dry Chest tube: No air leak    Lab Results: CBC: Basename 12/20/12 0435 12/18/12 0330  WBC 13.5* 20.8*  HGB 8.9* 9.1*  HCT 27.7* 26.9*  PLT 457* 470*   BMET:  Basename 12/20/12 0435 12/18/12 0330  NA 142 142  K 3.3* 3.2*  CL 106 106  CO2 27 27  GLUCOSE 111* 156*  BUN 16 18  CREATININE 1.30 1.10  CALCIUM 8.6 8.4    PT/INR: No results found for this basename: LABPROT,INR in the last 72 hours  CXR: Findings: Stable position of the right chest tubes. There is no  significant pneumothorax. Stable pleural-based densities in the  right lateral hemithorax. Stable focal parenchymal density in the  right midlung. Persistent densities at the left lung base. Heart  size is upper limits of normal but unchanged. Central venous  catheter near the SVC/right atrium junction. Extensive degenerative  changes in the left shoulder. Old left rib fractures.  IMPRESSION:  Stable position of the right chest tubes. No  definite  pneumothorax. Minimal change in the pleural and parenchymal  densities in the right hemithorax.  Stable left basilar densities.    Assessment/Plan: S/P Procedure(s) (LRB): VIDEO BRONCHOSCOPY (Right) VIDEO ASSISTED THORACOSCOPY (VATS)/EMPYEMA (Right) Empyema- Cxs no growth.  WBC trending down.  Continue Vanc/Zosyn. CT drainage decreased.  Possibly can d/c another CT today. Dysphagia- Speech tx following.  Continue D3 diet, aspiration precautions. Pulm- stable, off O2.  Continue pulm toilet. PT/OT/rehab.   LOS: 10 days    COLLINS,GINA H 12/20/2012    Patient seen and examined. Agree with above D/c middle CT Taking PO better with D3 diet Deconditioning is a major issue- continue PT/OT Cultures are negative, CXR shows a persistent RUL infiltrate- will plan 2 weeks of IV antibiotics and then 4 weeks of PO antibiotics

## 2012-12-20 NOTE — Progress Notes (Signed)
Clinical Child psychotherapist (CSW) spoke to pt main contact, pt ex-wife Marl Seago who states is pt HCPOA. Eber Jones confirmed pt is from [facility] however has hired a caregiver to be with pt daily from 11am-8pm. Eber Jones states she would prefer for pt to return to [facility] with [facility] PT/OT and will increase the caregivers hours if need be. Eber Jones states pt has been to skilled facilities in the past and has been very unhappy and therefore believes it would be best for pt to return to what is familiar. CSW contacted Asa Lente [phone] at [facility] to confirm if pt is able to return to the facility. Talbert Forest has requested pt PT/OT notes to review and determine if facility can manage pt needs. CSW has faxed clinicals and will await an update.  Theresia Bough, MSW, Theresia Majors [phone]

## 2012-12-21 ENCOUNTER — Inpatient Hospital Stay (HOSPITAL_COMMUNITY): Payer: Medicare Other

## 2012-12-21 MED ORDER — GERHARDT'S BUTT CREAM
TOPICAL_CREAM | CUTANEOUS | Status: DC | PRN
  Filled 2012-12-21: qty 1

## 2012-12-21 NOTE — Progress Notes (Addendum)
                    [address]            [place] [address]          [phone]     5 Days Post-Op Procedure(s) (LRB): VIDEO BRONCHOSCOPY (Right) VIDEO ASSISTED THORACOSCOPY (VATS)/EMPYEMA (Right)  Subjective: Feels well, no complaints.   Objective: Vital signs in last 24 hours: Patient Vitals for the past 24 hrs:  BP Temp Temp src Pulse Resp SpO2  12/21/12 0700 - 98.7 F (37.1 C) Oral - - -  12/21/12 0340 150/62 mmHg 98.7 F (37.1 C) Oral 58  17  94 %  12/21/12 0006 117/73 mmHg 97.9 F (36.6 C) Oral 55  18  99 %  12/20/12 1940 135/61 mmHg 98.8 F (37.1 C) Oral 56  18  96 %  12/20/12 1600 145/68 mmHg 98.1 F (36.7 C) Oral 55  20  95 %  12/20/12 1200 128/64 mmHg 98.7 F (37.1 C) Oral 61  15  95 %   Current Weight  12/17/12 153 lb 10.6 oz (69.7 kg)     Intake/Output from previous day: 01/03 0701 - 01/04 0700 In: 1920 [P.O.:840; I.V.:480; IV Piggyback:600] Out: 2305 [Urine:2225; Chest Tube:80]    PHYSICAL EXAM:  Heart: RRR Lungs: Slightly decreased BS in R base Wound: Clean and dry Chest tube: no air leak    Lab Results: CBC: Basename 12/20/12 0435  WBC 13.5*  HGB 8.9*  HCT 27.7*  PLT 457*   BMET:  Basename 12/20/12 0435  NA 142  K 3.3*  CL 106  CO2 27  GLUCOSE 111*  BUN 16  CREATININE 1.30  CALCIUM 8.6    PT/INR: No results found for this basename: LABPROT,INR in the last 72 hours  CXR: Findings: The right lower chest tube has been removed since  previous study but the other right chest tube remains in place. A  small right pleural effusion is unchanged, but no pneumothorax  identified. The right lung atelectasis is stable. Mild  atelectasis at left lung base is also unchanged. Right jugular  center venous catheter remains in appropriate position. Heart size  is stable. Old left rib fracture deformities again noted.  IMPRESSION:  1. No evidence of pneumothorax following removal of one of the  right chest tubes.  2.  Otherwise no significant change in small right pleural effusion  and right greater than left atelectasis.    Cx's negative  Assessment/Plan: S/P Procedure(s) (LRB): VIDEO BRONCHOSCOPY (Right) VIDEO ASSISTED THORACOSCOPY (VATS)/EMPYEMA (Right)  Empyema- Cxs no growth. WBC trending down. Continue Vanc/Zosyn D#11/14. Will need 4 weeks of po abx at d/c.  CT output minimal. Will place to water seal. Possibly can d/c remaining CT soon.   Dysphagia- Speech tx following. Continue D3 diet, aspiration precautions.   Pulm- stable, off O2. Continue pulm toilet.   PT/OT/rehab.  Disp- Back to SNF when medically stable.    LOS: 11 days    COLLINS,GINA H 12/21/2012    Chart reviewed, patient examined, agree with above. Can remove the remaining chest tube tomorrow if drainage continues to remain low.

## 2012-12-22 ENCOUNTER — Inpatient Hospital Stay (HOSPITAL_COMMUNITY): Payer: Medicare Other

## 2012-12-22 LAB — VANCOMYCIN, TROUGH: Vancomycin Tr: 27.6 ug/mL (ref 10.0–20.0)

## 2012-12-22 MED ORDER — VANCOMYCIN HCL 10 G IV SOLR
1750.0000 mg | INTRAVENOUS | Status: DC
  Administered 2012-12-23: 1750 mg via INTRAVENOUS
  Filled 2012-12-22: qty 1750

## 2012-12-22 NOTE — Progress Notes (Signed)
ANTIBIOTIC CONSULT NOTE - FOLLOW UP  Pharmacy Consult for Vancomycin Indication: HCAP & MRSA UTI  Allergies  Allergen Reactions  . Cholestatin Other (See Comments)    Per [facility]   Patient Measurements: Height: 5\' 7"  (170.2 cm) Weight: 153 lb 10.6 oz (69.7 kg) (standing weight scale) IBW/kg (Calculated) : 66.1   Vital Signs: Temp: 98.8 F (37.1 C) (01/05 0700) Temp src: Oral (01/05 0700) BP: 155/63 mmHg (01/05 0735) Pulse Rate: 53  (01/05 0735)  Intake/Output from previous day: 01/04 0701 - 01/05 0700 In: 1245 [P.O.:720; I.V.:200; IV Piggyback:325] Out: 2250 [Urine:2200; Chest Tube:50]  Labs:  [facility] 12/20/12 0435  WBC 13.5*  HGB 8.9*  PLT 457*  LABCREA --  CREATININE 1.30   Estimated Creatinine Clearance: 45.9 ml/min (by C-G formula based on Cr of 1.3).  Assessment: 78yo male (S/p VATS 12/31) continuing on Vancomycin and Zosyn for HCAP and MRSA UTI  (12/24 >>) with plans for 2 weeks of IV antibiotics followed by PO antibiotics at d/c x 4 weeks (per MD note).  Scr up from admission, but UOP good -- will check Vancomycin level prior to evening dose. WBC 13.5 (12/21/11), Afebrile.  Goal of Therapy:  Vancomycin trough level 15-20 mcg/ml Clinical improvement  Plan:  1) Check vancomycin trough tonight prior to evening dose.  2) Continue Zosyn 3.375 grams - 4 hr infusion 3) Follow-up Scr with AM labs, clinical course, plans for change to PO antibiotics  Benjaman Pott, PharmD    12/22/2012   10:31 AM

## 2012-12-22 NOTE — Progress Notes (Addendum)
ANTIBIOTIC CONSULT NOTE - FOLLOW UP  Pharmacy Consult for vancomycin Indication: HCAP/MRSA UTI  Labs:  [facility] 12/20/12 0435  WBC 13.5*  HGB 8.9*  PLT 457*  LABCREA --  CREATININE 1.30   Estimated Creatinine Clearance: 45.9 ml/min (by C-G formula based on Cr of 1.3).  Basename 12/22/12 2145  VANCOTROUGH 27.6*  VANCOPEAK --  Drue Dun --  GENTTROUGH --  GENTPEAK --  GENTRANDOM --  TOBRATROUGH --  TOBRAPEAK --  TOBRARND --  AMIKACINPEAK --  AMIKACINTROU --  AMIKACIN --     Assessment: 78yo male supratherapeutic on vancomycin for HCAP and MRSA UTI.  Goal of Therapy:  Vancomycin trough level 15-20 mcg/ml  Plan:  Will hold this dose of vancomycin then change to 1750mg  IV Q24H for calculated trough of ~18 and continue to monitor.  Colleen Can PharmD BCPS 12/22/2012,11:12 PM

## 2012-12-22 NOTE — Progress Notes (Addendum)
                    [address]            [place] [address]          [phone]     6 Days Post-Op Procedure(s) (LRB): VIDEO BRONCHOSCOPY (Right) VIDEO ASSISTED THORACOSCOPY (VATS)/EMPYEMA (Right)  Subjective: Comfortable, no complaints.  Breathing stable and little cough.    Objective: Vital signs in last 24 hours: Patient Vitals for the past 24 hrs:  BP Temp Temp src Pulse Resp SpO2  12/22/12 0700 - 98.8 F (37.1 C) Oral - - -  12/22/12 0346 146/61 mmHg 98.6 F (37 C) Oral 55  19  95 %  12/21/12 2326 168/61 mmHg 97.9 F (36.6 C) Oral 60  17  95 %  12/21/12 1956 146/64 mmHg 97.9 F (36.6 C) Oral 59  25  96 %  12/21/12 1613 125/59 mmHg 97.7 F (36.5 C) Oral 54  19  98 %  12/21/12 1240 133/62 mmHg 98.7 F (37.1 C) Oral 55  17  95 %  12/21/12 1100 - 98.7 F (37.1 C) Oral - - -   Current Weight  12/17/12 153 lb 10.6 oz (69.7 kg)     Intake/Output from previous day: 01/04 0701 - 01/05 0700 In: 1220 [P.O.:720; I.V.:200; IV Piggyback:300] Out: 2250 [Urine:2200; Chest Tube:50]    PHYSICAL EXAM:  Heart: RRR Lungs:Decreased BS R base Wound: Clean and dry  Chest tube: No air leak    Lab Results: CBC: Basename 12/20/12 0435  WBC 13.5*  HGB 8.9*  HCT 27.7*  PLT 457*   BMET:  Basename 12/20/12 0435  NA 142  K 3.3*  CL 106  CO2 27  GLUCOSE 111*  BUN 16  CREATININE 1.30  CALCIUM 8.6    PT/INR: No results found for this basename: LABPROT,INR in the last 72 hours    CXR: Findings:  Right jugular line and right thoracostomy tube unchanged.  Enlargement of cardiac silhouette.  Atherosclerotic calcification aorta.  Persistent right pleural effusion and basilar atelectasis.  Chronic bronchitic changes.  No new infiltrate or pneumothorax.  Old left rib fractures.  Advanced bilateral glenohumeral degenerative changes.  IMPRESSION:  No interval change.     Assessment/Plan: S/P Procedure(s) (LRB): VIDEO BRONCHOSCOPY  (Right) VIDEO ASSISTED THORACOSCOPY (VATS)/EMPYEMA (Right) Empyema- Vanc/Zosyn D#12/14. Will need 4 weeks of po abx at d/c. CT output minimal.  Will d/c remaining CT this am. Dysphagia- Speech tx following. Continue D3 diet, aspiration precautions.  Pulm- stable, off O2. Continue pulm toilet.  PT/OT/rehab.  Disp- Back to SNF when medically stable, ?Monday.      LOS: 12 days    COLLINS,GINA H 12/22/2012    Chart reviewed, patient examined, agree with above.

## 2012-12-23 ENCOUNTER — Inpatient Hospital Stay (HOSPITAL_COMMUNITY): Payer: Medicare Other

## 2012-12-23 DIAGNOSIS — J869 Pyothorax without fistula: Secondary | ICD-10-CM

## 2012-12-23 DIAGNOSIS — Z9889 Other specified postprocedural states: Secondary | ICD-10-CM

## 2012-12-23 LAB — URINE MICROSCOPIC-ADD ON

## 2012-12-23 LAB — BASIC METABOLIC PANEL
BUN: 18 mg/dL (ref 6–23)
Calcium: 8.5 mg/dL (ref 8.4–10.5)
Creatinine, Ser: 1.42 mg/dL — ABNORMAL HIGH (ref 0.50–1.35)
GFR calc Af Amer: 54 mL/min — ABNORMAL LOW (ref >90)
GFR calc non Af Amer: 47 mL/min — ABNORMAL LOW (ref >90)

## 2012-12-23 LAB — URINALYSIS, ROUTINE W REFLEX MICROSCOPIC
Bilirubin Urine: NEGATIVE
Ketones, ur: NEGATIVE mg/dL
Nitrite: NEGATIVE
Protein, ur: NEGATIVE mg/dL
Urobilinogen, UA: 0.2 mg/dL (ref 0.0–1.0)
pH: 6 (ref 5.0–8.0)

## 2012-12-23 MED ORDER — AMOXICILLIN-POT CLAVULANATE 875-125 MG PO TABS: 1.0000 | ORAL_TABLET | Freq: Two times a day (BID) | ORAL | Status: DC

## 2012-12-23 MED ORDER — AMOXICILLIN-POT CLAVULANATE 875-125 MG PO TABS
1.0000 | ORAL_TABLET | Freq: Two times a day (BID) | ORAL | Status: DC
  Administered 2012-12-23 – 2012-12-24 (×3): 1 via ORAL
  Filled 2012-12-23 (×4): qty 1

## 2012-12-23 NOTE — Progress Notes (Signed)
7 Days Post-Op Procedure(s) (LRB): VIDEO BRONCHOSCOPY (Right) VIDEO ASSISTED THORACOSCOPY (VATS)/EMPYEMA (Right) Subjective:  [name] has no complaints this morning. He did question why PT didn't work with him over the weekend.    Objective: Vital signs in last 24 hours: Temp:  [97.6 F (36.4 C)-98.6 F (37 C)] 97.6 F (36.4 C) (01/06 0800) Pulse Rate:  [55-58] 55  (01/06 0800) Cardiac Rhythm:  [-] Sinus bradycardia (01/06 0800) Resp:  [13-16] 16  (01/06 0800) BP: (104-148)/(48-71) 147/59 mmHg (01/06 0800) SpO2:  [93 %-95 %] 94 % (01/06 0800)  Intake/Output from previous day: 01/05 0701 - 01/06 0700 In: 1202.5 [P.O.:840; IV Piggyback:362.5] Out: 1901 [Urine:1900; Stool:1] Intake/Output this shift: Total I/O In: 500 [IV Piggyback:500] Out: 400 [Urine:400]  General appearance: alert, cooperative and no distress Heart: regular rate and rhythm Lungs: diminished breath sounds right base Abdomen: soft, non-tender; bowel sounds normal; no masses,  no organomegaly Extremities: extremities normal, atraumatic, no cyanosis or edema Wound: clean and dry  Lab Results: No results found for this basename: WBC:2,HGB:2,HCT:2,PLT:2 in the last 72 hours BMET:  [facility] 12/23/12 0540  NA 144  K 3.7  CL 108  CO2 28  GLUCOSE 93  BUN 18  CREATININE 1.42*  CALCIUM 8.5    PT/INR: No results found for this basename: LABPROT,INR in the last 72 hours ABG    Component Value Date/Time   PHART 7.391 12/17/2012 0614   HCO3 24.2* 12/17/2012 0614   TCO2 25 12/17/2012 0614   ACIDBASEDEF 1.0 12/17/2012 0614   O2SAT 95.0 12/17/2012 0614   CBG (last 3)  No results found for this basename: GLUCAP:3 in the last 72 hours  Assessment/Plan: S/P Procedure(s) (LRB): VIDEO BRONCHOSCOPY (Right) VIDEO ASSISTED THORACOSCOPY (VATS)/EMPYEMA (Right)  1. Empyema- will d/c IV ABX, Start Augmentin, will need 30 day course 2. Renal- creatinine elevated this morning, will d/c IV ABX, send UA, repeat  in AM 3. Dsyphagia- tolerating diet, speech following 4. Deconditioning- continue PT/OT 5. Dispo- will plan for d/c to SNF tomorrow, if creatinine improves   LOS: 13 days    [name], [name] 12/23/2012

## 2012-12-23 NOTE — Discharge Summary (Signed)
Physician Discharge Summary  Patient ID: [name] MRN: [id number] DOB/AGE: 1935-09-30 78 y.o.  Admit date: 12/10/2012 Discharge date: 12/23/2012  Admission Diagnoses:  Patient Active Problem List  Diagnosis  . Diabetes mellitus  . Hearing aid worn  . Bipolar 1 disorder  . UTI (lower urinary tract infection)  . Parkinson disease  . Elevated troponin  . Mental status, decreased  . Encephalopathy  . Metabolic encephalopathy  . Urinary tract infection  . Bacteremia of undetermined etiology  . Hypertension  . Diabetes mellitus, type 2  . Arthritis  . Bipolar disorder  . Anxiety  . Chronic indwelling foley catheter  . Neuromuscular disorder  . Acute renal failure  . Acute kidney injury  . Toxic encephalopathy  . Hypernatremia  . HCAP (healthcare-associated pneumonia)  . Parapneumonic effusion   Discharge Diagnoses:   Patient Active Problem List  Diagnosis  . Diabetes mellitus  . Hearing aid worn  . Bipolar 1 disorder  . UTI (lower urinary tract infection)  . Parkinson disease  . Elevated troponin  . Mental status, decreased  . Encephalopathy  . Metabolic encephalopathy  . Urinary tract infection  . Bacteremia of undetermined etiology  . Hypertension  . Diabetes mellitus, type 2  . Arthritis  . Bipolar disorder  . Anxiety  . Chronic indwelling foley catheter  . Neuromuscular disorder  . Acute renal failure  . Acute kidney injury  . Toxic encephalopathy  . Hypernatremia  . HCAP (healthcare-associated pneumonia)  . Parapneumonic effusion  . Empyema lung  . S/P thoracotomy   Discharged Condition: good  History of Present Illness:   [name] is a 78 yo white male with known history of Parkinson's disease and Bipolar Disorder.  He is a resident of the [name].  The patient presented to the Emergency Department after suffering a slide/fall of his bed.  The patient developed right sided chest pain.  Further questioning revealed the patient had  been suffering a progressive worsening which was being treated with Mucinex.  Chest xray obtained in the ED showed right lung consolidation and a pleural effusion.  CT scan confirmed the presence of a pleural effusion with compressive atelectasis of the RLL and consolidation of the RUL.   Hospital Course:    He was admitted and underwent Thoracentesis which removed 800 ml of fluid was removed which was exudative.  It was felt this effusion was most parapneumonic and consistent with an empyema.  He was placed on IV Antibiotics for treatment of pneumonia and TCTS was consulted for possible VATS procedure.  He was evaluated by Dr. Dorris Fetch on 12/13/2012 at which time he felt the patient would best be treated with medical therapy or VATS.  However, he would be high risk for surgical intervention.  He also felt the patient would benefit from repeat Chest xray which showed re-accumulation of right pleural effusion which was most likely loculated.  It was felt that VATs would be beneficial.  The risks and benefits of the procedure were explained to the patient and he was agreeable to proceed.  He was taken to the operating room on 12/16/2012 and underwent Bronchoscopy with extraction of foreign body, right VATS with drainage of empyema, visceral and parietal pleural decortication.  The patient tolerated the procedure well and was taken to the PACU in stable condition.  He was extubated the evening of 76 surgery.  His post operative course was notable for dysphagia requiring evaluation by speech and swallow who recommended continuation of a soft  diet.  Once medically stable he was transferred to the step down unit for further convalescence.  He received 14 days of IV antibiotics and will be placed on Augmentin for one month at discharge.  His chest tubes were removed once drainage had decreased.  This was done without difficulty and chest xray does not show evidence of pneumothorax.  Throughout the patient's hospital  stay he has suffered from confusion, which is his baseline status.  His creatinine has slowly trended up which we attribute to the use of IV Antibiotics.  We will repeat his lab work to ensure no further increase occurs.  His cultures obtained in the operating room were negative for bacterial growth.  He is medically stable at this time.  He will be started on Augmentin and will need to complete a month course.  We will plan for discharge back to the [name] in the next 24-48 hours.   He is scheduled to follow up with Dr. Dorris Fetch on 01/08/2012 at 10:45 with a chest xray prior to his appointment.      Treatments: surgery:   Bronchoscopy with extraction of foreign body, right video-  assisted thoracoscopy, drainage of empyema, visceral and parietal  pleural decortication  Disposition: 03-Skilled Nursing Facility     Medication List     As of 12/23/2012  8:57 AM    TAKE these medications         amoxicillin-clavulanate 875-125 MG per tablet   Commonly known as: AUGMENTIN   Take 1 tablet by mouth every 12 (twelve) hours. For 1 month      aspirin 81 MG tablet   Take 81 mg by mouth every morning.      AZO-CRANBERRY 450 MG Tabs   Generic drug: Cranberry   Take 1 tablet by mouth every morning.      carbidopa-levodopa 25-250 MG per tablet   Commonly known as: SINEMET IR   Take 1 tablet by mouth 3 (three) times daily.      clonazePAM 0.5 MG tablet   Commonly known as: KLONOPIN   Take 0.5 mg by mouth 2 (two) times daily as needed. anxiety      docusate sodium 50 MG capsule   Commonly known as: COLACE   Take by mouth 2 (two) times daily.      entacapone 200 MG tablet   Commonly known as: COMTAN   Take 200 mg by mouth 3 (three) times daily.      guaiFENesin 600 MG 12 hr tablet   Commonly known as: MUCINEX   Take 1,200 mg by mouth 2 (two) times daily as needed. cough      lamoTRIgine 150 MG tablet   Commonly known as: LAMICTAL   Take 150 mg by mouth 2 (two) times daily.        multivitamin with minerals Tabs   Take 1 tablet by mouth every morning.      naproxen 375 MG tablet   Commonly known as: NAPROSYN   Take 375 mg by mouth daily with breakfast.      neomycin-bacitracin-polymyxin [phone] ointment   Apply 1 application topically daily. Applies to buttock for pressure      omeprazole 20 MG capsule   Commonly known as: PRILOSEC   Take 20 mg by mouth every morning.      QUEtiapine 50 MG tablet   Commonly known as: SEROQUEL   Take 50 mg by mouth at bedtime.      RED YEAST RICE PO  Take 2 tablets by mouth at bedtime.      sertraline 50 MG tablet   Commonly known as: ZOLOFT   Take 150 mg by mouth every morning.      traZODone 50 MG tablet   Commonly known as: DESYREL   Take 50 mg by mouth at bedtime as needed. sleep      Valerian Root 500 MG Caps   Take 1 capsule by mouth at bedtime.         SignedLowella Dandy 12/23/2012, 8:57 AM

## 2012-12-23 NOTE — Progress Notes (Signed)
Physical Therapy Treatment Patient Details Name: [name] MRN: [id number] DOB: December 26, 1934 Today's Date: 12/23/2012 Time: 4098-1191 PT Time Calculation (min): 23 min  PT Assessment / Plan / Recommendation Comments on Treatment Session  Pt able to increase ambulation distance and needed less assistance with mobility.  Pt continues to need +2 (A) to transfer and ambulation due to posterior and flexed posture.     Follow Up Recommendations  SNF     Does the patient have the potential to tolerate intense rehabilitation     Barriers to Discharge        Equipment Recommendations  None recommended by PT    Recommendations for Other Services    Frequency Min 3X/week   Plan Discharge plan remains appropriate;Frequency remains appropriate    Precautions / Restrictions Precautions Precautions: Fall Restrictions Weight Bearing Restrictions: No   Pertinent Vitals/Pain C/o right sided pain at incision     Mobility  Bed Mobility Bed Mobility: Supine to Sit Supine to Sit: 4: Min assist Sitting - Scoot to Edge of Bed: 4: Min assist Details for Bed Mobility Assistance: (A) to elevate trunk OOB with cues for hand placement.  Pt continues to lean posterior during mobility. Transfers Transfers: Sit to Stand;Stand to Sit Sit to Stand: 1: +2 Total assist;From bed Sit to Stand: Patient Percentage: 60% Stand to Sit: 1: +2 Total assist;To chair/3-in-1 Stand to Sit: Patient Percentage: 50% Details for Transfer Assistance: +2 (A) to initiate transfer and slowly descend to recliner due to severe posterior lean and continues flexed posture.  Max cues for proper technique. Ambulation/Gait Ambulation/Gait Assistance: 1: +2 Total assist Ambulation/Gait: Patient Percentage: 60% Ambulation Distance (Feet): 5 Feet Assistive device: Rolling walker Ambulation/Gait Assistance Details: +2 (A) to maintain balance and promote upright posture with max cues for proper step sequence and LE advancement.  Pt  continues to stay in flexed posture and posterior lean. Gait Pattern: Step-to pattern;Decreased stride length;Shuffle;Trunk flexed Gait velocity: decreased Stairs: No Wheelchair Mobility Wheelchair Mobility: No    Exercises     PT Diagnosis:    PT Problem List:   PT Treatment Interventions:     PT Goals Acute Rehab PT Goals PT Goal Formulation: With patient Time For Goal Achievement: 01/02/13 Potential to Achieve Goals: Good Pt will go Supine/Side to Sit: with supervision PT Goal: Supine/Side to Sit - Progress: Progressing toward goal Pt will Sit at Edge of Bed: with supervision;1-2 min PT Goal: Sit at Edge Of Bed - Progress: Progressing toward goal Pt will go Sit to Supine/Side: with supervision PT Goal: Sit to Supine/Side - Progress: Progressing toward goal Pt will go Sit to Stand: with min assist PT Goal: Sit to Stand - Progress: Progressing toward goal Pt will go Stand to Sit: with min assist PT Goal: Stand to Sit - Progress: Progressing toward goal Pt will Stand: with supervision;1 - 2 min;with bilateral upper extremity support PT Goal: Stand - Progress: Progressing toward goal Pt will Ambulate: 1 - 15 feet;with rolling walker;with +2 total assist PT Goal: Ambulate - Progress: Progressing toward goal  Visit Information  Last PT Received On: 12/23/12 Assistance Needed: +2    Subjective Data  Subjective: "I'm not doing so good am I." Patient Stated Goal: To return to [facility]   Cognition  Overall Cognitive Status: Impaired Area of Impairment: Memory;Problem solving;Awareness of errors Arousal/Alertness: Awake/alert Orientation Level: Appears intact for tasks assessed Behavior During Session: [facility] for tasks performed Memory Deficits: decreased recall of situation as well as PLOF and  place of living Problem Solving: pt slow to process and needs extra time to complete task.    Balance  Balance Balance Assessed: Yes Static Sitting Balance Static Sitting -  Balance Support: Feet supported Static Sitting - Level of Assistance: 3: Mod assist;5: Stand by assistance Static Sitting - Comment/# of Minutes: Occasional (A) to maintain upright posture and midline due to posterior and left sided lean.   Static Standing Balance Static Standing - Balance Support: Bilateral upper extremity supported Static Standing - Level of Assistance: 1: +2 Total assist;Patient percentage (comment) Static Standing - Comment/# of Minutes: ~2 minutes x 2 with max cues for upright posture and midline.  Pt continues to lean posterior throughout session in flexed posture.    End of Session PT - End of Session Equipment Utilized During Treatment: Gait belt Activity Tolerance: Patient tolerated treatment well Patient left: in chair;with call bell/phone within reach Nurse Communication: Mobility status   GP     [name] 12/23/2012, 9:35 AM

## 2012-12-23 NOTE — Progress Notes (Signed)
Clinical Child psychotherapist (CSW) spoke with Talbert Forest at [facility] who confirmed the facility is able to accept pt at discharge. Facility feels able to provide necessary care along with pt private care giver who will resume services at the facility. CSW to fax updated FL2 on the day of discharge to [phone].   Theresia Bough, MSW, Theresia Majors [phone]

## 2012-12-24 LAB — URINE CULTURE

## 2012-12-24 LAB — BASIC METABOLIC PANEL
BUN: 17 mg/dL (ref 6–23)
Calcium: 9 mg/dL (ref 8.4–10.5)
Chloride: 110 mEq/L (ref 96–112)
Creatinine, Ser: 1.28 mg/dL (ref 0.50–1.35)
GFR calc Af Amer: 61 mL/min — ABNORMAL LOW (ref >90)

## 2012-12-24 NOTE — Progress Notes (Addendum)
CT sutures & CL d/c,  New foley, Pt tolerated. Discharge paperwork signed by DR. Disposition, [name].

## 2012-12-24 NOTE — Progress Notes (Signed)
Paged MD on-call Cornelius Moras) regarding pt's lack of output via foley, a palpable bladder, and a bladder scan of >575mL. Order received for NS flush. MD endorsed that foley could be changed this AM if flush unsuccessful. Flush successful, but catheter is very positional. Will convey to oncoming RN the possible need to replace catheter prior to discharging pt to SNF. Will continue to monitor.

## 2012-12-24 NOTE — Progress Notes (Signed)
Speech Language Pathology Dysphagia Treatment Patient Details Name: [name] MRN: [id number] DOB: 28-May-1935 Today's Date: 12/24/2012 Time: 4098-1191 SLP Time Calculation (min): 34 min  Assessment / Plan / Recommendation Clinical Impression  Treatment session focused on pt education of aspiration risk and utilization of compensatory strategies prior to d/c. Pt verbalized strategies with min question cues and independent usage of environmental cues (sign). Pt requried max verbal cues to coordinate chewing, chin tuck and swallow. Consistent throat clearing observed post swallow, likely due to pts lack of second swallow. Pt frusterated this am, negative outlook "I cant do this" Positive encouragement provided to pt. Hopeful that f/u with Home Health SLP in familiar setting will facilitate habitulaization of strategies. Please ensure pt has HH SLP fu.     Diet Recommendation  Continue with Current Diet: Dysphagia 3 (mechanical soft);Thin liquid    SLP Plan Continue with current plan of care   Pertinent Vitals/Pain n/a   Swallowing Goals  SLP Swallowing Goals Patient will consume recommended diet without observed clinical signs of aspiration with: Moderate assistance;Supervision/safety Swallow Study Goal #1 - Progress: Progressing toward goal Patient will utilize recommended strategies during swallow to increase swallowing safety with: Moderate assistance Swallow Study Goal #2 - Progress: Progressing toward goal  General Temperature Spikes Noted: No Respiratory Status: Room air Behavior/Cognition: Cooperative;Alert Oral Cavity - Dentition: Adequate natural dentition Patient Positioning: Upright in bed  Oral Cavity - Oral Hygiene Does patient have any of the following "at risk" factors?: None of the above Brush patient's teeth BID with toothbrush (using toothpaste with fluoride): Yes   Dysphagia Treatment Treatment focused on: Utilization of compensatory  strategies;Patient/family/caregiver education;Skilled observation of diet tolerance Treatment Methods/Modalities: Skilled observation Patient observed directly with PO's: Yes Type of PO's observed: Thin liquids;Dysphagia 3 (soft) Feeding: Able to feed self;Needs set up Liquids provided via: Cup;No straw Oral Phase Signs & Symptoms: Prolonged oral phase;Prolonged bolus formation;Prolonged mastication Pharyngeal Phase Signs & Symptoms: Suspected delayed swallow initiation;Immediate throat clear Type of cueing: Verbal Amount of cueing: Moderate   GO     [name] T 12/24/2012, 10:36 AM

## 2012-12-24 NOTE — Care Management Note (Signed)
    Page 1 of 1   12/24/2012     11:22:51 AM   CARE MANAGEMENT NOTE 12/24/2012  Patient:  [name], [name]   Account Number:  [id number]  Date Initiated:  12/11/2012  Documentation initiated by:  PEARSON,COOKIE  Subjective/Objective Assessment:   pt admitted with cco right sided chest pain, cough     Action/Plan:   Pt is from ALF.   Anticipated DC Date:  12/26/2012   Anticipated DC Plan:  ASSISTED LIVING / REST HOME  In-house referral  Clinical Social Worker      DC Planning Services  CM consult      Choice offered to / List presented to:             Status of service:  Completed, signed off Medicare Important Message given?  NA - LOS <3 / Initial given by admissions (If response is "NO", the following Medicare IM given date fields will be blank) Date Medicare IM given:   Date Additional Medicare IM given:    Discharge Disposition:  ASSISTED LIVING  Per UR Regulation:  Reviewed for med. necessity/level of care/duration of stay  If discussed at Long Length of Stay Meetings, dates discussed:   12/24/2012    Comments:  12/24/12- 1120- Donn Pierini RN, BSN [phone] Pt s/p VATS post op day 8- plan to return to ALF- [name] today- CSW following for d/c needs.  12/17/12- 1645- Donn Pierini RN, BSN [phone] Pt tx from ICU to SDU today day 1 s/p VATS- NCM to cont. to follow for d/c needs   12/11/12 MPearson, RN, BSN Chart reviewed

## 2012-12-24 NOTE — Progress Notes (Signed)
Clinical Child psychotherapist (CSW) has prepared and placed pt dc packet in pt shadow chart. CSW notified pt ex-wife. Clinicals have been received by facility and [facility] has been contacted for a 1:45pm transport to [facility]. No additional CSW needs. CSW signing off.  Theresia Bough, MSW, Theresia Majors [phone]

## 2012-12-24 NOTE — Progress Notes (Signed)
Speech Language Pathology Dysphagia Treatment Patient Details Name: [name] MRN: [id number] DOB: [birth year] Today's Date: 12/24/2012 Time: 9562-1308 SLP Time Calculation (min): 34 min  Assessment / Plan / Recommendation Clinical Impression  Treatment session focused on pt education of aspiration risk and utilization of compensatory strategies prior to d/c. Pt verbalized strategies with min question cues and independent usage of environmental cues (sign). Pt requried max verbal cues to coordinate chewing, chin tuck and swallow. Consistent throat clearing observed post swallow, likely due to pts lack of second swallow. Pt frusterated this am, negative outlook "I cant do this" Positive encouragement provided to pt. Hopeful that f/u with Home Health SLP in familiar setting will facilitate habitulaization of strategies. Please ensure pt has HH SLP fu.     Diet Recommendation  Continue with Current Diet: Dysphagia 3 (mechanical soft);Thin liquid    SLP Plan Continue with current plan of care   Pertinent Vitals/Pain    Swallowing Goals  SLP Swallowing Goals Patient will consume recommended diet without observed clinical signs of aspiration with: Moderate assistance;Supervision/safety Swallow Study Goal #1 - Progress: Progressing toward goal Patient will utilize recommended strategies during swallow to increase swallowing safety with: Moderate assistance Swallow Study Goal #2 - Progress: Progressing toward goal  General Temperature Spikes Noted: No Respiratory Status: Room air Behavior/Cognition: Cooperative;Alert Oral Cavity - Dentition: Adequate natural dentition Patient Positioning: Upright in bed  Oral Cavity - Oral Hygiene Does patient have any of the following "at risk" factors?: None of the above Brush patient's teeth BID with toothbrush (using toothpaste with fluoride): Yes   Dysphagia Treatment Treatment focused on: Utilization of compensatory  strategies;Patient/family/caregiver education;Skilled observation of diet tolerance Treatment Methods/Modalities: Skilled observation Patient observed directly with PO's: Yes Type of PO's observed: Thin liquids;Dysphagia 3 (soft) Feeding: Able to feed self;Needs set up Liquids provided via: Cup;No straw Oral Phase Signs & Symptoms: Prolonged oral phase;Prolonged bolus formation;Prolonged mastication Pharyngeal Phase Signs & Symptoms: Suspected delayed swallow initiation;Immediate throat clear Type of cueing: Verbal Amount of cueing: Moderate   GO     [name] T 12/24/2012, 10:37 AM

## 2012-12-24 NOTE — Progress Notes (Signed)
OT Cancellation Note  Patient Details Name: [name] MRN: [id number] DOB: 09-Jul-1935   Cancelled Treatment:    Reason Eval/Treat Not Completed: Other (comment) (d/c with ambulance transport in room)  [name], [name] Pager: [phone]  12/24/2012, 2:07 PM

## 2012-12-24 NOTE — Progress Notes (Addendum)
Speech Language Pathology Dysphagia Treatment Patient Details Name: [name] MRN: [id number] DOB: 04/01/1935 Today's Date: 12/24/2012 Time: 4098-1191 SLP Time Calculation (min): 34 min  Assessment / Plan / Recommendation Clinical Impression  Treatment session focused on pt education of aspiration risk and utilization of compensatory strategies prior to d/c. Pt verbalized strategies with min question cues and independent usage of environmental cues (sign). Pt requried max verbal cues to coordinate chewing, chin tuck and swallow. Consistent throat clearing observed post swallow, likely due to pts lack of second swallow. Pt frusterated this am, negative outlook "I cant do this" Positive encouragement provided to pt. Hopeful that f/u with SLP in familiar setting will facilitate habitulaization of strategies. Please ensure pt has SLP fu.     Diet Recommendation  Continue with Current Diet: Dysphagia 3 (mechanical soft);Thin liquid    SLP Plan Continue with current plan of care   Pertinent Vitals/Pain NA   Swallowing Goals  SLP Swallowing Goals Patient will consume recommended diet without observed clinical signs of aspiration with: Moderate assistance;Supervision/safety Swallow Study Goal #1 - Progress: Progressing toward goal Patient will utilize recommended strategies during swallow to increase swallowing safety with: Moderate assistance Swallow Study Goal #2 - Progress: Progressing toward goal  General Temperature Spikes Noted: No Respiratory Status: Room air Behavior/Cognition: Cooperative;Alert Oral Cavity - Dentition: Adequate natural dentition Patient Positioning: Upright in bed  Oral Cavity - Oral Hygiene Does patient have any of the following "at risk" factors?: None of the above Brush patient's teeth BID with toothbrush (using toothpaste with fluoride): Yes   Dysphagia Treatment Treatment focused on: Utilization of compensatory strategies;Patient/family/caregiver  education;Skilled observation of diet tolerance Treatment Methods/Modalities: Skilled observation Patient observed directly with PO's: Yes Type of PO's observed: Thin liquids;Dysphagia 3 (soft) Feeding: Able to feed self;Needs set up Liquids provided via: Cup;No straw Oral Phase Signs & Symptoms: Prolonged oral phase;Prolonged bolus formation;Prolonged mastication Pharyngeal Phase Signs & Symptoms: Suspected delayed swallow initiation;Immediate throat clear Type of cueing: Verbal Amount of cueing: Moderate   GO    [name], [place] CCC-SLP [phone]  [name] 12/24/2012, 9:20 AM

## 2012-12-24 NOTE — Progress Notes (Addendum)
[address]            [place] [address]          [phone]     8 Days Post-Op  Procedure(s) (LRB): VIDEO BRONCHOSCOPY (Right) VIDEO ASSISTED THORACOSCOPY (VATS)/EMPYEMA (Right) Subjective: Feels ok, foley had to be flushed x2 last night  Objective  Telemetry sinus with some PVC's  Temp:  [97.2 F (36.2 C)-98.4 F (36.9 C)] 98.4 F (36.9 C) (01/07 0337) Pulse Rate:  [56-61] 60  (01/07 0337) Resp:  [20-22] 20  (01/07 0337) BP: (113-180)/(56-70) 144/70 mmHg (01/07 0800) SpO2:  [94 %-97 %] 97 % (01/07 0337)   Intake/Output Summary (Last 24 hours) at 12/24/12 0804 Last data filed at 12/24/12 0340  Gross per 24 hour  Intake    840 ml  Output   2750 ml  Net  -1910 ml       General appearance: alert, cooperative and no distress Heart: regular rate and rhythm Lungs: dim in bases Abdomen: soft, nontender Extremities: no edema Wound: incis ok  Lab Results:  Basename 12/24/12 0600 12/23/12 0540  NA 145 144  K 3.7 3.7  CL 110 108  CO2 27 28  GLUCOSE 99 93  BUN 17 18  CREATININE 1.28 1.42*  CALCIUM 9.0 8.5  MG -- --  PHOS -- --   No results found for this basename: AST:2,ALT:2,ALKPHOS:2,BILITOT:2,PROT:2,ALBUMIN:2 in the last 72 hours No results found for this basename: LIPASE:2,AMYLASE:2 in the last 72 hours No results found for this basename: WBC:2,NEUTROABS:2,HGB:2,HCT:2,MCV:2,PLT:2 in the last 72 hours No results found for this basename: CKTOTAL:4,CKMB:4,TROPONINI:4 in the last 72 hours No components found with this basename: POCBNP:3 No results found for this basename: DDIMER in the last 72 hours No results found for this basename: HGBA1C in the last 72 hours No results found for this basename: CHOL,HDL,LDLCALC,TRIG,CHOLHDL in the last 72 hours No results found for this basename: TSH,T4TOTAL,FREET3,T3FREE,THYROIDAB in the last 72 hours No results found for this basename: VITAMINB12,FOLATE,FERRITIN,TIBC,IRON,RETICCTPCT in  the last 72 hours  Medications: Scheduled    . amoxicillin-clavulanate  1 tablet Oral Q12H  . aspirin EC  81 mg Oral q morning - 10a  . bisacodyl  10 mg Oral Daily  . carbidopa-levodopa  1 tablet Oral TID  . enoxaparin  40 mg Subcutaneous Q24H  . entacapone  200 mg Oral TID  . lamoTRIgine  150 mg Oral BID  . QUEtiapine  50 mg Oral QHS  . sertraline  150 mg Oral q morning - 10a  . sodium chloride  10-40 mL Intracatheter Q12H    v Radiology/Studies:  Dg Chest 2 View  12/23/2012  *RADIOLOGY REPORT*  Clinical Data: Post chest tube removal.  CHEST - 2 VIEW  Comparison: 12/22/2012  Findings: Patchy right lung airspace disease again noted with right effusion, stable.  Mild cardiomegaly.  No confluent opacity on the left.  Right central line is unchanged.  Advanced degenerative changes in the shoulders. Multiple old left rib fractures again seen.  No pneumothorax.  IMPRESSION: Stable right lung airspace disease and right effusion. No change.   Original Report Authenticated By: Charlett Nose, M.D.    Dg Chest Port 1 View  12/22/2012  *RADIOLOGY REPORT*  Clinical Data: Post chest tube removal for empyema  PORTABLE CHEST - 1 VIEW  Comparison: 12/22/2012 at 6:11 a.m.  Findings: Severe bilateral glenohumeral joint degenerative change. Right IJ central  line tip over distal SVC.  No pneumothorax is identified after right sided chest tube removal.  Right lung is hypo aerated, worse since the prior study, with increased right mid lung zone and right base linear presumed atelectasis.  Small right pleural effusion persists.  Moderate enlargement of the cardiomediastinal silhouette again noted.  Left lung grossly clear with stable remote multiple left-sided rib fractures.  IMPRESSION: Increased right midlung zone and right lower lobe atelectasis.  No pneumothorax after chest tube removal.  Stable small right pleural effusion.   Original Report Authenticated By: Christiana Pellant, M.D.     INR: Will add last result  for INR, ABG once components are confirmed Will add last 4 CBG results once components are confirmed  Assessment/Plan: S/P Procedure(s) (LRB): VIDEO BRONCHOSCOPY (Right) VIDEO ASSISTED THORACOSCOPY (VATS)/EMPYEMA (Right)  1 will change out foley as getting obstructed at times, UA positive for RBC, WBC, yeast, mod leuk- may need to consider additional ABX- culture pending 2 appears otherwise stable for transfer to facility as outlined by social work   LOS: 14 days    [name],[name] 1/7/20148:04 AM    Patient seen and examined. Agree with above.

## 2012-12-25 ENCOUNTER — Emergency Department (HOSPITAL_COMMUNITY)
Admission: EM | Admit: 2012-12-25 | Discharge: 2012-12-25 | Disposition: A | Discharge status: Skilled Nursing Facility | Payer: Medicare Other | Attending: Emergency Medicine | Admitting: Emergency Medicine

## 2012-12-25 ENCOUNTER — Encounter (HOSPITAL_COMMUNITY): Payer: Self-pay | Admitting: Emergency Medicine

## 2012-12-25 DIAGNOSIS — T83091A Other mechanical complication of indwelling urethral catheter, initial encounter: Secondary | ICD-10-CM | POA: Insufficient documentation

## 2012-12-25 DIAGNOSIS — F329 Major depressive disorder, single episode, unspecified: Secondary | ICD-10-CM | POA: Insufficient documentation

## 2012-12-25 DIAGNOSIS — R109 Unspecified abdominal pain: Secondary | ICD-10-CM | POA: Insufficient documentation

## 2012-12-25 DIAGNOSIS — Z8659 Personal history of other mental and behavioral disorders: Secondary | ICD-10-CM | POA: Insufficient documentation

## 2012-12-25 DIAGNOSIS — G20A1 Parkinson's disease without dyskinesia, without mention of fluctuations: Secondary | ICD-10-CM | POA: Insufficient documentation

## 2012-12-25 DIAGNOSIS — Z8701 Personal history of pneumonia (recurrent): Secondary | ICD-10-CM | POA: Insufficient documentation

## 2012-12-25 DIAGNOSIS — Z7982 Long term (current) use of aspirin: Secondary | ICD-10-CM | POA: Insufficient documentation

## 2012-12-25 DIAGNOSIS — Y846 Urinary catheterization as the cause of abnormal reaction of the patient, or of later complication, without mention of misadventure at the time of the procedure: Secondary | ICD-10-CM | POA: Insufficient documentation

## 2012-12-25 DIAGNOSIS — Z79899 Other long term (current) drug therapy: Secondary | ICD-10-CM | POA: Insufficient documentation

## 2012-12-25 DIAGNOSIS — N390 Urinary tract infection, site not specified: Secondary | ICD-10-CM | POA: Insufficient documentation

## 2012-12-25 DIAGNOSIS — F3289 Other specified depressive episodes: Secondary | ICD-10-CM | POA: Insufficient documentation

## 2012-12-25 DIAGNOSIS — Z8739 Personal history of other diseases of the musculoskeletal system and connective tissue: Secondary | ICD-10-CM | POA: Insufficient documentation

## 2012-12-25 DIAGNOSIS — Z8614 Personal history of Methicillin resistant Staphylococcus aureus infection: Secondary | ICD-10-CM | POA: Insufficient documentation

## 2012-12-25 DIAGNOSIS — I10 Essential (primary) hypertension: Secondary | ICD-10-CM | POA: Insufficient documentation

## 2012-12-25 DIAGNOSIS — E119 Type 2 diabetes mellitus without complications: Secondary | ICD-10-CM | POA: Insufficient documentation

## 2012-12-25 DIAGNOSIS — F319 Bipolar disorder, unspecified: Secondary | ICD-10-CM | POA: Insufficient documentation

## 2012-12-25 DIAGNOSIS — G20 Parkinson's disease: Secondary | ICD-10-CM | POA: Insufficient documentation

## 2012-12-25 DIAGNOSIS — Z8669 Personal history of other diseases of the nervous system and sense organs: Secondary | ICD-10-CM | POA: Insufficient documentation

## 2012-12-25 NOTE — ED Provider Notes (Signed)
History     CSN: [id number]  Arrival date & time 12/25/12  1601   First MD Initiated Contact with Patient 12/25/12 1643      Chief Complaint  Patient presents with  . Urinary Retention    urinary catheter issue    (Consider location/radiation/quality/duration/timing/severity/associated sxs/prior treatment) HPI Comments: Mr. Loiseau presents for of evaluation urinary retention.  He had a foley catheter replaced yesterday and there was some bleeding when the catheter was inserted.  Today, he has passed only a minimal amount of urine into the catheter and there has been no leakage around the foley.  His daughter reports he has previously clogged catheters with clots.  He is being treated for a UTI currently.  He denies fever and has had normal po intake today.  The history is provided by the patient. No language interpreter was used.    Past Medical History  Diagnosis Date  . Hearing aid worn   . Hypertension   . Parkinson disease   . Pneumonia   . Diabetes mellitus type II   . Arthritis   . Bipolar disorder   . Anxiety   . Depression   . MRSA infection (methicillin-resistant Staphylococcus aureus)   . Chronic indwelling foley catheter   . C2 cervical fracture     due to pt fall  . SIRS (systemic inflammatory response syndrome)   . Neuromuscular disorder     Past Surgical History  Procedure Date  . Total hip arthroplasty   . Video bronchoscopy 12/16/2012    Procedure: VIDEO BRONCHOSCOPY;  Surgeon: Loreli Slot, MD;  Location: [facility] OR;  Service: Thoracic;  Laterality: Right;  . Video assisted thoracoscopy (vats)/empyema 12/16/2012    Procedure: VIDEO ASSISTED THORACOSCOPY (VATS)/EMPYEMA;  Surgeon: Loreli Slot, MD;  Location: [facility] OR;  Service: Thoracic;  Laterality: Right;    Family History  Problem Relation Age of Onset  . Depression Father     History  Substance Use Topics  . Smoking status: Never Smoker   . Smokeless tobacco: Never Used  . Alcohol  Use: Yes     Comment: occasional      Review of Systems  All other systems reviewed and are negative.    Allergies  Cholestatin  Home Medications   Current Outpatient Rx  Name  Route  Sig  Dispense  Refill  . AMOXICILLIN-POT CLAVULANATE 875-125 MG PO TABS   Oral   Take 1 tablet by mouth every 12 (twelve) hours. For 1 month   60 tablet   0   . ASPIRIN 81 MG PO TABS   Oral   Take 81 mg by mouth every morning.          Marland Kitchen CARBIDOPA-LEVODOPA 25-250 MG PO TABS   Oral   Take 1 tablet by mouth 3 (three) times daily.          Marland Kitchen CRANBERRY 450 MG PO TABS   Oral   Take 1 tablet by mouth every morning.         Marland Kitchen DOCUSATE SODIUM 50 MG PO CAPS   Oral   Take by mouth 2 (two) times daily.         Marland Kitchen ENTACAPONE 200 MG PO TABS   Oral   Take 200 mg by mouth 3 (three) times daily.          . GUAIFENESIN ER 600 MG PO TB12   Oral   Take 1,200 mg by mouth 2 (two) times daily as needed. cough         .  LAMOTRIGINE 150 MG PO TABS   Oral   Take 150 mg by mouth 2 (two) times daily.         Marland Kitchen LORAZEPAM 0.5 MG PO TABS   Oral   Take 0.5 mg by mouth 2 (two) times daily as needed. Anxiety         . ADULT MULTIVITAMIN W/MINERALS CH   Oral   Take 1 tablet by mouth every morning.          Marland Kitchen NAPROXEN 375 MG PO TABS   Oral   Take 375 mg by mouth daily with breakfast.         . OMEPRAZOLE 20 MG PO CPDR   Oral   Take 20 mg by mouth every morning.         Marland Kitchen QUETIAPINE FUMARATE 50 MG PO TABS   Oral   Take 50 mg by mouth at bedtime.         . RED YEAST RICE PO   Oral   Take 2 tablets by mouth at bedtime.           . SERTRALINE HCL 50 MG PO TABS   Oral   Take 50 mg by mouth every morning.          . TRAZODONE HCL 50 MG PO TABS   Oral   Take 50 mg by mouth at bedtime as needed. sleep         . VALERIAN ROOT 500 MG PO CAPS   Oral   Take 1 capsule by mouth at bedtime.           BP 196/71  Pulse 63  Temp 97.6 F (36.4 C)  SpO2  98%  Physical Exam  Nursing note and vitals reviewed. Constitutional: He is oriented to person, place, and time. He appears well-nourished. He is active.  Non-toxic appearance. He has a sickly appearance. He does not appear ill. No distress.  HENT:  Head: Normocephalic and atraumatic.  Right Ear: External ear normal.  Left Ear: External ear normal.  Nose: Nose normal.  Mouth/Throat: Oropharynx is clear and moist. No oropharyngeal exudate.  Eyes: Conjunctivae normal are normal. Pupils are equal, round, and reactive to light. Right eye exhibits no discharge. Left eye exhibits no discharge. No scleral icterus.  Neck: Normal range of motion. Neck supple. No JVD present. No tracheal deviation present.  Cardiovascular: Normal rate, regular rhythm and intact distal pulses.  Exam reveals distant heart sounds. Exam reveals no gallop and no friction rub.   No murmur heard. Pulmonary/Chest: Effort normal and breath sounds normal. No stridor. No respiratory distress. He has no wheezes. He has no rales. He exhibits no tenderness.  Abdominal: Soft. Bowel sounds are normal. He exhibits distension. He exhibits no mass. There is tenderness. There is no rebound and no guarding.       Suprapubic discomfort and mild distention   Musculoskeletal: Normal range of motion. He exhibits no edema and no tenderness.  Lymphadenopathy:    He has no cervical adenopathy.  Neurological: He is alert and oriented to person, place, and time. No cranial nerve deficit.  Skin: Skin is warm and dry. No rash noted. He is not diaphoretic. No erythema. There is pallor.  Psychiatric: He has a normal mood and affect. His behavior is normal.    ED Course  Procedures (including critical care time)  Labs Reviewed - No data to display No results found.   No diagnosis found.    MDM  Pt  presents for evaluation of suprapubic discomfort.  He has had no output from his foley in several hours.  He appears chronically ill but  nontoxic, note elevated BP, NAD.  Nursing will attempt to flush the current catheter.  If unsuccessful, it will be removed and a new catheter will be placed.   1845.  Pt stable, NAD.  Flushing foley was able to relieve the obstruction.  Urine is passing freely.  Plan discharge home.  Will consult soc services because his home health service did not restart after his recent admission.     Tobin Chad, MD 12/25/12 1850

## 2012-12-25 NOTE — ED Notes (Signed)
Caregiver states that pt just had catheter removed yesterday and another was placed in. Blood was seen in urine last night. Also states that he is taking amoxicillin for UTI now. Pt states that penis is really burning and hurting (10/10)

## 2012-12-25 NOTE — Progress Notes (Signed)
CSW assisted with pt discharge to return to [facility]. Pt received orders of Home Health pt, ot, and rn. NCM assist with pt home health needs. .No further Clinical Social Work needs, signing off.   Catha Gosselin, LCSWA  [phone] 12/25/2012 1926pm

## 2012-12-25 NOTE — ED Notes (Addendum)
Per EMS:  From [facility]. pain in urinary catheter started this morning, 100 cc this morning urine output but none since then, scant amount of blood.

## 2012-12-25 NOTE — ED Notes (Signed)
[id number]<AV> Expected date:<BR> Expected time:<BR> Means of arrival:<BR> Comments:<BR> Urinary difficulty

## 2012-12-26 ENCOUNTER — Ambulatory Visit (HOSPITAL_COMMUNITY): Payer: Self-pay | Admitting: Psychiatry

## 2012-12-26 ENCOUNTER — Telehealth (HOSPITAL_COMMUNITY): Payer: Self-pay

## 2012-12-26 NOTE — Telephone Encounter (Signed)
12/26/12 the care-taker called stating that patient was discharge form the hospital last night - not feeling well will not be at his appt today./sh

## 2013-01-03 ENCOUNTER — Other Ambulatory Visit: Payer: Self-pay | Admitting: *Deleted

## 2013-01-03 ENCOUNTER — Encounter (HOSPITAL_COMMUNITY): Payer: Self-pay | Admitting: *Deleted

## 2013-01-03 ENCOUNTER — Emergency Department (HOSPITAL_COMMUNITY)
Admission: EM | Admit: 2013-01-03 | Discharge: 2013-01-03 | Disposition: A | Discharge status: Home / Self Care | Payer: Medicare Other | Attending: Emergency Medicine | Admitting: Emergency Medicine

## 2013-01-03 DIAGNOSIS — I10 Essential (primary) hypertension: Secondary | ICD-10-CM | POA: Insufficient documentation

## 2013-01-03 DIAGNOSIS — G20 Parkinson's disease: Secondary | ICD-10-CM | POA: Insufficient documentation

## 2013-01-03 DIAGNOSIS — E119 Type 2 diabetes mellitus without complications: Secondary | ICD-10-CM | POA: Insufficient documentation

## 2013-01-03 DIAGNOSIS — Z8701 Personal history of pneumonia (recurrent): Secondary | ICD-10-CM | POA: Insufficient documentation

## 2013-01-03 DIAGNOSIS — J869 Pyothorax without fistula: Secondary | ICD-10-CM

## 2013-01-03 DIAGNOSIS — Z79899 Other long term (current) drug therapy: Secondary | ICD-10-CM | POA: Insufficient documentation

## 2013-01-03 DIAGNOSIS — Z8781 Personal history of (healed) traumatic fracture: Secondary | ICD-10-CM | POA: Insufficient documentation

## 2013-01-03 DIAGNOSIS — Z8614 Personal history of Methicillin resistant Staphylococcus aureus infection: Secondary | ICD-10-CM | POA: Insufficient documentation

## 2013-01-03 DIAGNOSIS — N489 Disorder of penis, unspecified: Secondary | ICD-10-CM | POA: Insufficient documentation

## 2013-01-03 DIAGNOSIS — F329 Major depressive disorder, single episode, unspecified: Secondary | ICD-10-CM | POA: Insufficient documentation

## 2013-01-03 DIAGNOSIS — N4889 Other specified disorders of penis: Secondary | ICD-10-CM

## 2013-01-03 DIAGNOSIS — F411 Generalized anxiety disorder: Secondary | ICD-10-CM | POA: Insufficient documentation

## 2013-01-03 DIAGNOSIS — Z791 Long term (current) use of non-steroidal anti-inflammatories (NSAID): Secondary | ICD-10-CM | POA: Insufficient documentation

## 2013-01-03 DIAGNOSIS — Z7982 Long term (current) use of aspirin: Secondary | ICD-10-CM | POA: Insufficient documentation

## 2013-01-03 DIAGNOSIS — Z96649 Presence of unspecified artificial hip joint: Secondary | ICD-10-CM | POA: Insufficient documentation

## 2013-01-03 DIAGNOSIS — F3289 Other specified depressive episodes: Secondary | ICD-10-CM | POA: Insufficient documentation

## 2013-01-03 DIAGNOSIS — M129 Arthropathy, unspecified: Secondary | ICD-10-CM | POA: Insufficient documentation

## 2013-01-03 DIAGNOSIS — G20A1 Parkinson's disease without dyskinesia, without mention of fluctuations: Secondary | ICD-10-CM | POA: Insufficient documentation

## 2013-01-03 DIAGNOSIS — F319 Bipolar disorder, unspecified: Secondary | ICD-10-CM | POA: Insufficient documentation

## 2013-01-03 LAB — URINE MICROSCOPIC-ADD ON

## 2013-01-03 LAB — URINALYSIS, ROUTINE W REFLEX MICROSCOPIC
Bilirubin Urine: NEGATIVE
Glucose, UA: NEGATIVE mg/dL
Ketones, ur: NEGATIVE mg/dL
Nitrite: NEGATIVE
Protein, ur: NEGATIVE mg/dL
pH: 6.5 (ref 5.0–8.0)

## 2013-01-03 MED ORDER — PHENAZOPYRIDINE HCL 200 MG PO TABS: 200.0000 mg | ORAL_TABLET | Freq: Three times a day (TID) | ORAL | Status: DC

## 2013-01-03 MED ORDER — PHENAZOPYRIDINE HCL 200 MG PO TABS
200.0000 mg | ORAL_TABLET | Freq: Three times a day (TID) | ORAL | Status: DC
  Administered 2013-01-03: 200 mg via ORAL
  Filled 2013-01-03: qty 2

## 2013-01-03 NOTE — ED Notes (Signed)
[facility] is at bedside  

## 2013-01-03 NOTE — ED Notes (Signed)
[id number]<AV> Expected date:01/03/13<BR> Expected time: 5:16 AM<BR> Means of arrival:Ambulance<BR> Comments:<BR> Catheter problems

## 2013-01-03 NOTE — ED Notes (Signed)
Pt is from [facility], pt is having pain at urethral catheter site. Pt is currently being treated for UTI.

## 2013-01-03 NOTE — ED Provider Notes (Signed)
History     CSN: [id number]  Arrival date & time 01/03/13  0535   First MD Initiated Contact with Patient 01/03/13 0703      Chief Complaint  Patient presents with  . Dysuria    (Consider location/radiation/quality/duration/timing/severity/associated sxs/prior treatment) The history is provided by the patient.  pt here c/o pain at glans of penis x 24hours--currently being tx for uti--denies fever, vomiting, back pain--no recent penile trauma, does have chronic indwelling foley-pain is constant and nothing makes it better or worse  Past Medical History  Diagnosis Date  . Hearing aid worn   . Hypertension   . Parkinson disease   . Pneumonia   . Diabetes mellitus type II   . Arthritis   . Bipolar disorder   . Anxiety   . Depression   . MRSA infection (methicillin-resistant Staphylococcus aureus)   . Chronic indwelling foley catheter   . C2 cervical fracture     due to pt fall  . SIRS (systemic inflammatory response syndrome)   . Neuromuscular disorder     Past Surgical History  Procedure Date  . Total hip arthroplasty   . Video bronchoscopy 12/16/2012    Procedure: VIDEO BRONCHOSCOPY;  Surgeon: Loreli Slot, MD;  Location: [facility] OR;  Service: Thoracic;  Laterality: Right;  . Video assisted thoracoscopy (vats)/empyema 12/16/2012    Procedure: VIDEO ASSISTED THORACOSCOPY (VATS)/EMPYEMA;  Surgeon: Loreli Slot, MD;  Location: [facility] OR;  Service: Thoracic;  Laterality: Right;    Family History  Problem Relation Age of Onset  . Depression Father     History  Substance Use Topics  . Smoking status: Never Smoker   . Smokeless tobacco: Never Used  . Alcohol Use: Yes     Comment: occasional      Review of Systems  All other systems reviewed and are negative.    Allergies  Cholestatin  Home Medications   Current Outpatient Rx  Name  Route  Sig  Dispense  Refill  . AMOXICILLIN-POT CLAVULANATE 875-125 MG PO TABS   Oral   Take 1 tablet by mouth  every 12 (twelve) hours. For 1 month   60 tablet   0   . ASPIRIN 81 MG PO TABS   Oral   Take 81 mg by mouth every morning.          Marland Kitchen CARBIDOPA-LEVODOPA 25-250 MG PO TABS   Oral   Take 1 tablet by mouth 3 (three) times daily.          Marland Kitchen CLONAZEPAM 0.5 MG PO TABS   Oral   Take 0.5 mg by mouth 2 (two) times daily as needed. For anxiety         . CRANBERRY 450 MG PO TABS   Oral   Take 1 tablet by mouth every morning.         Marland Kitchen DOCUSATE SODIUM 50 MG PO CAPS   Oral   Take by mouth 2 (two) times daily.         Marland Kitchen ENTACAPONE 200 MG PO TABS   Oral   Take 200 mg by mouth 3 (three) times daily.          . GUAIFENESIN ER 600 MG PO TB12   Oral   Take 1,200 mg by mouth 2 (two) times daily as needed. cough         . LAMOTRIGINE 150 MG PO TABS   Oral   Take 150 mg by mouth 2 (two) times daily.         Marland Kitchen  ADULT MULTIVITAMIN W/MINERALS CH   Oral   Take 1 tablet by mouth every morning.          Marland Kitchen NAPROXEN 375 MG PO TABS   Oral   Take 375 mg by mouth daily with breakfast.         . OMEPRAZOLE 20 MG PO CPDR   Oral   Take 20 mg by mouth every morning.         Marland Kitchen QUETIAPINE FUMARATE 50 MG PO TABS   Oral   Take 50 mg by mouth at bedtime.         . RED YEAST RICE PO   Oral   Take 2 tablets by mouth at bedtime.           . SERTRALINE HCL 50 MG PO TABS   Oral   Take 50 mg by mouth every morning.          Marland Kitchen VALERIAN ROOT 500 MG PO CAPS   Oral   Take 1 capsule by mouth at bedtime.         . TRAZODONE HCL 50 MG PO TABS   Oral   Take 50 mg by mouth at bedtime as needed. sleep           BP 189/82  Pulse 56  Temp 97.9 F (36.6 C) (Oral)  Resp 18  SpO2 98%  Physical Exam  Nursing note and vitals reviewed. Constitutional: He is oriented to person, place, and time. He appears well-developed and well-nourished.  Non-toxic appearance. No distress.  HENT:  Head: Normocephalic and atraumatic.  Eyes: Conjunctivae normal, EOM and lids are normal.  Pupils are equal, round, and reactive to light.  Neck: Normal range of motion. Neck supple. No tracheal deviation present. No mass present.  Cardiovascular: Normal rate, regular rhythm and normal heart sounds.  Exam reveals no gallop.   No murmur heard. Pulmonary/Chest: Effort normal and breath sounds normal. No stridor. No respiratory distress. He has no decreased breath sounds. He has no wheezes. He has no rhonchi. He has no rales.  Abdominal: Soft. Normal appearance and bowel sounds are normal. He exhibits no distension. There is no tenderness. There is no rebound and no CVA tenderness.  Genitourinary: Right testis is descended. Left testis is descended. Circumcised.       No blisters or drainage from glans, no inguinal hernia ,   Musculoskeletal: Normal range of motion. He exhibits no edema and no tenderness.  Neurological: He is alert and oriented to person, place, and time. He has normal strength. No cranial nerve deficit or sensory deficit. GCS eye subscore is 4. GCS verbal subscore is 5. GCS motor subscore is 6.  Skin: Skin is warm and dry. No abrasion and no rash noted.  Psychiatric: He has a normal mood and affect. His speech is normal and behavior is normal.    ED Course  Procedures (including critical care time)  Labs Reviewed  URINALYSIS, ROUTINE W REFLEX MICROSCOPIC - Abnormal; Notable for the following:    APPearance CLOUDY (*)     Leukocytes, UA SMALL (*)     All other components within normal limits  URINE MICROSCOPIC-ADD ON   No results found.   No diagnosis found.    MDM  No signs of infection, pt given pyridium, foley intact ,stable for d/c        Toy Baker, MD 01/03/13 [phone]

## 2013-01-03 NOTE — ED Notes (Signed)
Report to [facility] at [facility]. [facility] called for transport

## 2013-01-04 ENCOUNTER — Emergency Department (HOSPITAL_COMMUNITY)
Admission: EM | Admit: 2013-01-04 | Discharge: 2013-01-04 | Disposition: A | Discharge status: Home / Self Care | Payer: Medicare Other | Attending: Emergency Medicine | Admitting: Emergency Medicine

## 2013-01-04 ENCOUNTER — Encounter (HOSPITAL_COMMUNITY): Payer: Self-pay | Admitting: Emergency Medicine

## 2013-01-04 DIAGNOSIS — E119 Type 2 diabetes mellitus without complications: Secondary | ICD-10-CM | POA: Insufficient documentation

## 2013-01-04 DIAGNOSIS — M129 Arthropathy, unspecified: Secondary | ICD-10-CM | POA: Insufficient documentation

## 2013-01-04 DIAGNOSIS — G20A1 Parkinson's disease without dyskinesia, without mention of fluctuations: Secondary | ICD-10-CM | POA: Insufficient documentation

## 2013-01-04 DIAGNOSIS — F411 Generalized anxiety disorder: Secondary | ICD-10-CM | POA: Insufficient documentation

## 2013-01-04 DIAGNOSIS — Z8614 Personal history of Methicillin resistant Staphylococcus aureus infection: Secondary | ICD-10-CM | POA: Insufficient documentation

## 2013-01-04 DIAGNOSIS — Z8669 Personal history of other diseases of the nervous system and sense organs: Secondary | ICD-10-CM | POA: Insufficient documentation

## 2013-01-04 DIAGNOSIS — T83091A Other mechanical complication of indwelling urethral catheter, initial encounter: Secondary | ICD-10-CM | POA: Insufficient documentation

## 2013-01-04 DIAGNOSIS — I10 Essential (primary) hypertension: Secondary | ICD-10-CM | POA: Insufficient documentation

## 2013-01-04 DIAGNOSIS — Y846 Urinary catheterization as the cause of abnormal reaction of the patient, or of later complication, without mention of misadventure at the time of the procedure: Secondary | ICD-10-CM | POA: Insufficient documentation

## 2013-01-04 DIAGNOSIS — F319 Bipolar disorder, unspecified: Secondary | ICD-10-CM | POA: Insufficient documentation

## 2013-01-04 DIAGNOSIS — F3289 Other specified depressive episodes: Secondary | ICD-10-CM | POA: Insufficient documentation

## 2013-01-04 DIAGNOSIS — Z8709 Personal history of other diseases of the respiratory system: Secondary | ICD-10-CM | POA: Insufficient documentation

## 2013-01-04 DIAGNOSIS — F329 Major depressive disorder, single episode, unspecified: Secondary | ICD-10-CM | POA: Insufficient documentation

## 2013-01-04 DIAGNOSIS — Z8701 Personal history of pneumonia (recurrent): Secondary | ICD-10-CM | POA: Insufficient documentation

## 2013-01-04 DIAGNOSIS — Z8781 Personal history of (healed) traumatic fracture: Secondary | ICD-10-CM | POA: Insufficient documentation

## 2013-01-04 DIAGNOSIS — Z79899 Other long term (current) drug therapy: Secondary | ICD-10-CM | POA: Insufficient documentation

## 2013-01-04 DIAGNOSIS — N289 Disorder of kidney and ureter, unspecified: Secondary | ICD-10-CM | POA: Insufficient documentation

## 2013-01-04 DIAGNOSIS — Z87828 Personal history of other (healed) physical injury and trauma: Secondary | ICD-10-CM | POA: Insufficient documentation

## 2013-01-04 DIAGNOSIS — Z7982 Long term (current) use of aspirin: Secondary | ICD-10-CM | POA: Insufficient documentation

## 2013-01-04 DIAGNOSIS — G20 Parkinson's disease: Secondary | ICD-10-CM | POA: Insufficient documentation

## 2013-01-04 DIAGNOSIS — Z8619 Personal history of other infectious and parasitic diseases: Secondary | ICD-10-CM | POA: Insufficient documentation

## 2013-01-04 LAB — URINE MICROSCOPIC-ADD ON

## 2013-01-04 LAB — BASIC METABOLIC PANEL
Calcium: 9.6 mg/dL (ref 8.4–10.5)
Creatinine, Ser: 2.73 mg/dL — ABNORMAL HIGH (ref 0.50–1.35)
GFR calc non Af Amer: 21 mL/min — ABNORMAL LOW (ref >90)
Sodium: 134 mEq/L — ABNORMAL LOW (ref 135–145)

## 2013-01-04 LAB — CBC WITH DIFFERENTIAL/PLATELET
Basophils Absolute: 0.1 10*3/uL (ref 0.0–0.1)
Basophils Relative: 0 % (ref 0–1)
Eosinophils Absolute: 0.3 10*3/uL (ref 0.0–0.7)
Eosinophils Relative: 2 % (ref 0–5)
HCT: 34.2 % — ABNORMAL LOW (ref 39.0–52.0)
Hemoglobin: 11.2 g/dL — ABNORMAL LOW (ref 13.0–17.0)
Monocytes Absolute: 0.7 10*3/uL (ref 0.1–1.0)
Platelets: 411 10*3/uL — ABNORMAL HIGH (ref 150–400)
RBC: 4.01 MIL/uL — ABNORMAL LOW (ref 4.22–5.81)

## 2013-01-04 LAB — URINALYSIS, ROUTINE W REFLEX MICROSCOPIC
Ketones, ur: NEGATIVE mg/dL
Protein, ur: NEGATIVE mg/dL
Urobilinogen, UA: 0.2 mg/dL (ref 0.0–1.0)

## 2013-01-04 NOTE — ED Notes (Signed)
Upon assessment, pt.'sfoley catheter noted to have about 50cc of clear amber colored urine draining well to the  drainage bag. Pt. Denies of any pain, appeared to be sleepy but able to answer questions.

## 2013-01-04 NOTE — ED Notes (Signed)
[facility] notified via phone

## 2013-01-04 NOTE — ED Provider Notes (Signed)
History     CSN: [id number]  Arrival date & time 01/04/13  4132   First MD Initiated Contact with Patient 01/04/13 [phone]      Chief Complaint  Patient presents with  . foley catheter change     (Consider location/radiation/quality/duration/timing/severity/associated sxs/prior treatment) The history is provided by the nursing home. The history is limited by the condition of the patient (dementia).   78 year old male who was sent from an assisted living facility because his Foley catheter was not draining and there is concern that it might be clogged. Patient is not aware of why he is here and is unable to give any relevant information.  Past Medical History  Diagnosis Date  . Hearing aid worn   . Hypertension   . Parkinson disease   . Pneumonia   . Diabetes mellitus type II   . Arthritis   . Bipolar disorder   . Anxiety   . Depression   . MRSA infection (methicillin-resistant Staphylococcus aureus)   . Chronic indwelling foley catheter   . C2 cervical fracture     due to pt fall  . SIRS (systemic inflammatory response syndrome)   . Neuromuscular disorder     Past Surgical History  Procedure Date  . Total hip arthroplasty   . Video bronchoscopy 12/16/2012    Procedure: VIDEO BRONCHOSCOPY;  Surgeon: Loreli Slot, MD;  Location: [facility] OR;  Service: Thoracic;  Laterality: Right;  . Video assisted thoracoscopy (vats)/empyema 12/16/2012    Procedure: VIDEO ASSISTED THORACOSCOPY (VATS)/EMPYEMA;  Surgeon: Loreli Slot, MD;  Location: [facility] OR;  Service: Thoracic;  Laterality: Right;    Family History  Problem Relation Age of Onset  . Depression Father     History  Substance Use Topics  . Smoking status: Never Smoker   . Smokeless tobacco: Never Used  . Alcohol Use: Yes     Comment: occasional      Review of Systems  Unable to perform ROS: Dementia    Allergies  Cholestatin  Home Medications   Current Outpatient Rx  Name  Route  Sig  Dispense   Refill  . AMOXICILLIN-POT CLAVULANATE 875-125 MG PO TABS   Oral   Take 1 tablet by mouth every 12 (twelve) hours. For 1 month   60 tablet   0   . ASPIRIN 81 MG PO TABS   Oral   Take 81 mg by mouth every morning.          Marland Kitchen CARBIDOPA-LEVODOPA 25-250 MG PO TABS   Oral   Take 1 tablet by mouth 3 (three) times daily.          Marland Kitchen CLONAZEPAM 0.5 MG PO TABS   Oral   Take 0.5 mg by mouth 2 (two) times daily as needed. For anxiety         . CRANBERRY 450 MG PO TABS   Oral   Take 1 tablet by mouth every morning.         Marland Kitchen DOCUSATE SODIUM 50 MG PO CAPS   Oral   Take by mouth 2 (two) times daily.         Marland Kitchen ENTACAPONE 200 MG PO TABS   Oral   Take 200 mg by mouth 3 (three) times daily.          . GUAIFENESIN ER 600 MG PO TB12   Oral   Take 1,200 mg by mouth 2 (two) times daily as needed. cough         .  LAMOTRIGINE 150 MG PO TABS   Oral   Take 150 mg by mouth 2 (two) times daily.         . ADULT MULTIVITAMIN W/MINERALS CH   Oral   Take 1 tablet by mouth every morning.          Marland Kitchen NAPROXEN 375 MG PO TABS   Oral   Take 375 mg by mouth daily with breakfast.         . OMEPRAZOLE 20 MG PO CPDR   Oral   Take 20 mg by mouth every morning.         Marland Kitchen QUETIAPINE FUMARATE 50 MG PO TABS   Oral   Take 50 mg by mouth at bedtime.         . RED YEAST RICE PO   Oral   Take 2 tablets by mouth at bedtime.           . SERTRALINE HCL 50 MG PO TABS   Oral   Take 50 mg by mouth every morning.          . TRAZODONE HCL 50 MG PO TABS   Oral   Take 50 mg by mouth at bedtime as needed. sleep         . VALERIAN ROOT 500 MG PO CAPS   Oral   Take 1 capsule by mouth at bedtime.         Marland Kitchen PHENAZOPYRIDINE HCL 200 MG PO TABS   Oral   Take 1 tablet (200 mg total) by mouth 3 (three) times daily.   6 tablet   0     BP 174/77  Pulse 65  Temp 98.3 F (36.8 C) (Oral)  Resp 18  SpO2 98%  Physical Exam  Nursing note and vitals reviewed.  78 year old male,  resting comfortably and in no acute distress. Vital signs are significant for hypertension with blood pressure 174/77. Oxygen saturation is 98%, which is normal. Head is normocephalic and atraumatic. PERRLA, EOMI. Oropharynx is clear. Neck is nontender and supple without adenopathy or JVD. Back is nontender and there is no CVA tenderness. Lungs are clear without rales, wheezes, or rhonchi. Chest is nontender. Heart has regular rate and rhythm without murmur. Abdomen is soft, flat, nontender without masses or hepatosplenomegaly and peristalsis is normoactive. Genitalia: Foley catheter is in place draining clear urine. No evidence of catheter obstruction is present. Extremities have no cyanosis or edema, full range of motion is present. Skin is warm and dry without rash. Neurologic: He is awake, alert, oriented to person, cranial nerves are intact, there are no motor or sensory deficits.   ED Course  Procedures (including critical care time)  Results for orders placed during the hospital encounter of 01/04/13  CBC WITH DIFFERENTIAL      Component Value Range   WBC 12.2 (*) 4.0 - 10.5 K/uL   RBC 4.01 (*) 4.22 - 5.81 MIL/uL   Hemoglobin 11.2 (*) 13.0 - 17.0 g/dL   HCT 96.0 (*) 45.4 - 09.8 %   MCV 85.3  78.0 - 100.0 fL   MCH 27.9  26.0 - 34.0 pg   MCHC 32.7  30.0 - 36.0 g/dL   RDW 11.9 (*) 14.7 - 82.9 %   Platelets 411 (*) 150 - 400 K/uL   Neutrophils Relative 83 (*) 43 - 77 %   Neutro Abs 10.1 (*) 1.7 - 7.7 K/uL   Lymphocytes Relative 9 (*) 12 - 46 %   Lymphs Abs 1.1  0.7 - 4.0 K/uL   Monocytes Relative 5  3 - 12 %   Monocytes Absolute 0.7  0.1 - 1.0 K/uL   Eosinophils Relative 2  0 - 5 %   Eosinophils Absolute 0.3  0.0 - 0.7 K/uL   Basophils Relative 0  0 - 1 %   Basophils Absolute 0.1  0.0 - 0.1 K/uL  BASIC METABOLIC PANEL      Component Value Range   Sodium 134 (*) 135 - 145 mEq/L   Potassium 4.5  3.5 - 5.1 mEq/L   Chloride 99  96 - 112 mEq/L   CO2 23  19 - 32 mEq/L    Glucose, Bld 118 (*) 70 - 99 mg/dL   BUN 37 (*) 6 - 23 mg/dL   Creatinine, Ser 4.09 (*) 0.50 - 1.35 mg/dL   Calcium 9.6  8.4 - 81.1 mg/dL   GFR calc non Af Amer 21 (*) >90 mL/min   GFR calc Af Amer 25 (*) >90 mL/min  URINALYSIS, ROUTINE W REFLEX MICROSCOPIC      Component Value Range   Color, Urine ORANGE (*) YELLOW   APPearance CLEAR  CLEAR   Specific Gravity, Urine 1.008  1.005 - 1.030   pH 6.5  5.0 - 8.0   Glucose, UA NEGATIVE  NEGATIVE mg/dL   Hgb urine dipstick NEGATIVE  NEGATIVE   Bilirubin Urine NEGATIVE  NEGATIVE   Ketones, ur NEGATIVE  NEGATIVE mg/dL   Protein, ur NEGATIVE  NEGATIVE mg/dL   Urobilinogen, UA 0.2  0.0 - 1.0 mg/dL   Nitrite POSITIVE (*) NEGATIVE   Leukocytes, UA MODERATE (*) NEGATIVE  URINE MICROSCOPIC-ADD ON      Component Value Range   WBC, UA 0-2  <3 WBC/hpf   Urine-Other MANY YEAST      1. Complication, blocked Foley catheter   2. Renal insufficiency       MDM  Apparent decreased urine output. Renal function will be checked. Lipids are reviewed and it is noted that he was seen in the ED yesterday for pain at the site of this catheter.  Creatinine has come back increased over baseline. Foley catheter was removed and drained 2 L. 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 I do not see an indication for hospitalization, but that he would need to be repeated in 2 days to make sure that it is not getting worse. Urine has yeast present and a positive nitrite. He did not have a clinical infection, so antibiotics will not be given at this time.      Dione Booze, MD 01/04/13 1048

## 2013-01-04 NOTE — ED Notes (Addendum)
Per EMS, pt.from [name] with complaint of clogged foley catheter, reported to have no urine output  for more than 6 hours.Denies pain .

## 2013-01-07 ENCOUNTER — Ambulatory Visit (INDEPENDENT_AMBULATORY_CARE_PROVIDER_SITE_OTHER): Payer: Self-pay | Admitting: Thoracic Surgery (Cardiothoracic Vascular Surgery)

## 2013-01-07 ENCOUNTER — Encounter: Payer: Self-pay | Admitting: Thoracic Surgery (Cardiothoracic Vascular Surgery)

## 2013-01-07 ENCOUNTER — Ambulatory Visit
Admission: RE | Admit: 2013-01-07 | Discharge: 2013-01-07 | Disposition: A | Discharge status: Home / Self Care | Payer: Medicare Other | Source: Ambulatory Visit | Attending: Thoracic Surgery (Cardiothoracic Vascular Surgery) | Admitting: Thoracic Surgery (Cardiothoracic Vascular Surgery)

## 2013-01-07 VITALS — BP 155/73 | HR 58 | Resp 20 | Ht 70.0 in | Wt 150.0 lb

## 2013-01-07 DIAGNOSIS — J869 Pyothorax without fistula: Secondary | ICD-10-CM

## 2013-01-07 DIAGNOSIS — Z09 Encounter for follow-up examination after completed treatment for conditions other than malignant neoplasm: Secondary | ICD-10-CM

## 2013-01-07 DIAGNOSIS — Z9889 Other specified postprocedural states: Secondary | ICD-10-CM

## 2013-01-07 NOTE — Progress Notes (Signed)
HPI:  [name] returns today for a scheduled followup visit.  He had bronchoscopy with removal of a foreign body and right thoracoscopy for drainage of empyema and decortication on 12/16/2013. He had aspirated a popcorn kernel and had totally obstructed the anterior segmental bronchus of the right upper lobe. This led to a pneumonia and empyema. His postoperative course was uncomplicated he was discharged back to [facility] on oral antibiotics.  He says that he's been doing well. He's not having much discomfort. He has not had any problems with his breathing since discharge. He says is not had any problems with choking. Overall he feels well.  Past Medical History  Diagnosis Date  . Hearing aid worn   . Hypertension   . Parkinson disease   . Pneumonia   . Diabetes mellitus type II   . Arthritis   . Bipolar disorder   . Anxiety   . Depression   . MRSA infection (methicillin-resistant Staphylococcus aureus)   . Chronic indwelling foley catheter   . C2 cervical fracture     due to pt fall  . SIRS (systemic inflammatory response syndrome)   . Neuromuscular disorder   Pneumonia with empyema secondary to aspirated foreign body 12/13    Current Outpatient Prescriptions  Medication Sig Dispense Refill  . amoxicillin-clavulanate (AUGMENTIN) 875-125 MG per tablet Take 1 tablet by mouth every 12 (twelve) hours. For 1 month  60 tablet  0  . aspirin 81 MG tablet Take 81 mg by mouth every morning.       . carbidopa-levodopa (SINEMET) 25-250 MG per tablet Take 1 tablet by mouth 3 (three) times daily.       . clonazePAM (KLONOPIN) 0.5 MG tablet Take 0.5 mg by mouth 2 (two) times daily as needed. For anxiety      . Cranberry (AZO-CRANBERRY) 450 MG TABS Take 1 tablet by mouth every morning.      . docusate sodium (COLACE) 50 MG capsule Take by mouth 2 (two) times daily.      . entacapone (COMTAN) 200 MG tablet Take 200 mg by mouth 3 (three) times daily.       Marland Kitchen guaiFENesin (MUCINEX)  600 MG 12 hr tablet Take 1,200 mg by mouth 2 (two) times daily as needed. cough      . lamoTRIgine (LAMICTAL) 150 MG tablet Take 150 mg by mouth 2 (two) times daily.      . Multiple Vitamin (MULTIVITAMIN WITH MINERALS) TABS Take 1 tablet by mouth every morning.       . naproxen (NAPROSYN) 375 MG tablet Take 375 mg by mouth daily with breakfast.      . omeprazole (PRILOSEC) 20 MG capsule Take 20 mg by mouth every morning.      . phenazopyridine (PYRIDIUM) 200 MG tablet Take 1 tablet (200 mg total) by mouth 3 (three) times daily.  6 tablet  0  . QUEtiapine (SEROQUEL) 50 MG tablet Take 50 mg by mouth at bedtime.      . Red Yeast Rice Extract (RED YEAST RICE PO) Take 2 tablets by mouth at bedtime.        . sertraline (ZOLOFT) 50 MG tablet Take 50 mg by mouth every morning.       . traZODone (DESYREL) 50 MG tablet Take 50 mg by mouth at bedtime as needed. sleep      . Valerian Root 500 MG CAPS Take 1 capsule by mouth at bedtime.        Physical  Exam BP 155/73  Pulse 58  Resp 20  Ht 5\' 10"  (1.778 m)  Wt 150 lb (68.04 kg)  BMI 21.52 kg/m2  SpO18 24% 78 year old male in no acute distress Lungs slightly diminished right base otherwise clear Incisions healing well  Diagnostic Tests: Chest x-ray 01/07/2013 Clinical Data: Follow up of empyema, post thoracotomy, some  shortness of breath  CHEST - 2 VIEW  Comparison: Chest x-ray of 12/23/2012  Findings: Aeration of the lungs has improved with decrease in  basilar atelectasis. An opacity remains extending anteriorly  within the right upper lobe postoperatively and a tiny right  effusion is present. Cardiomegaly is stable. Multiple old left  rib fractures are noted. There are degenerative changes in the  shoulders.  IMPRESSION:  Improved aeration with persistent opacity extending anteriorly in  the right upper lobe with small right pleural effusion.  Impression: 78 year old gentleman with bipolar disorder and Parkinson's disease, who  developed pneumonia and empyema secondary to obstruction of the bronchus foreign body December. He has had a good surgical result following decortication. He is having minimal discomfort. His x-ray shows continued resolution of the pneumonic and pleural processes.  He will complete one month of oral Augmentin. After that he will not need any additional antibiotics unless new problems arise.  Plan:  I will be happy to see [name] back any time if I can be of any further assistance with his care.

## 2013-01-09 ENCOUNTER — Ambulatory Visit (HOSPITAL_COMMUNITY): Payer: Self-pay | Admitting: Psychiatry

## 2013-01-12 LAB — FUNGUS CULTURE W SMEAR

## 2013-01-23 ENCOUNTER — Ambulatory Visit (HOSPITAL_COMMUNITY): Payer: Self-pay | Admitting: Psychiatry

## 2013-01-23 ENCOUNTER — Telehealth (HOSPITAL_COMMUNITY): Payer: Self-pay

## 2013-01-28 LAB — AFB CULTURE WITH SMEAR (NOT AT ARMC)

## 2013-01-30 ENCOUNTER — Emergency Department (HOSPITAL_COMMUNITY): Payer: Medicare Other

## 2013-01-30 ENCOUNTER — Emergency Department (HOSPITAL_COMMUNITY)
Admission: EM | Admit: 2013-01-30 | Discharge: 2013-01-30 | Disposition: A | Discharge status: Home / Self Care | Payer: Medicare Other | Source: Home / Self Care | Attending: Emergency Medicine | Admitting: Emergency Medicine

## 2013-01-30 ENCOUNTER — Encounter (HOSPITAL_COMMUNITY): Payer: Self-pay

## 2013-01-30 DIAGNOSIS — Z8619 Personal history of other infectious and parasitic diseases: Secondary | ICD-10-CM | POA: Insufficient documentation

## 2013-01-30 DIAGNOSIS — F319 Bipolar disorder, unspecified: Secondary | ICD-10-CM | POA: Insufficient documentation

## 2013-01-30 DIAGNOSIS — Z7982 Long term (current) use of aspirin: Secondary | ICD-10-CM | POA: Insufficient documentation

## 2013-01-30 DIAGNOSIS — S0180XA Unspecified open wound of other part of head, initial encounter: Secondary | ICD-10-CM | POA: Insufficient documentation

## 2013-01-30 DIAGNOSIS — F411 Generalized anxiety disorder: Secondary | ICD-10-CM | POA: Insufficient documentation

## 2013-01-30 DIAGNOSIS — Z8781 Personal history of (healed) traumatic fracture: Secondary | ICD-10-CM | POA: Insufficient documentation

## 2013-01-30 DIAGNOSIS — I10 Essential (primary) hypertension: Secondary | ICD-10-CM | POA: Insufficient documentation

## 2013-01-30 DIAGNOSIS — Z79899 Other long term (current) drug therapy: Secondary | ICD-10-CM | POA: Insufficient documentation

## 2013-01-30 DIAGNOSIS — W19XXXA Unspecified fall, initial encounter: Secondary | ICD-10-CM | POA: Insufficient documentation

## 2013-01-30 DIAGNOSIS — G20 Parkinson's disease: Secondary | ICD-10-CM | POA: Insufficient documentation

## 2013-01-30 DIAGNOSIS — Z8669 Personal history of other diseases of the nervous system and sense organs: Secondary | ICD-10-CM | POA: Insufficient documentation

## 2013-01-30 DIAGNOSIS — N390 Urinary tract infection, site not specified: Secondary | ICD-10-CM | POA: Insufficient documentation

## 2013-01-30 DIAGNOSIS — S0181XA Laceration without foreign body of other part of head, initial encounter: Secondary | ICD-10-CM

## 2013-01-30 DIAGNOSIS — S0990XA Unspecified injury of head, initial encounter: Secondary | ICD-10-CM | POA: Insufficient documentation

## 2013-01-30 DIAGNOSIS — E119 Type 2 diabetes mellitus without complications: Secondary | ICD-10-CM | POA: Insufficient documentation

## 2013-01-30 DIAGNOSIS — S61209A Unspecified open wound of unspecified finger without damage to nail, initial encounter: Secondary | ICD-10-CM | POA: Insufficient documentation

## 2013-01-30 DIAGNOSIS — Z8701 Personal history of pneumonia (recurrent): Secondary | ICD-10-CM | POA: Insufficient documentation

## 2013-01-30 DIAGNOSIS — Y939 Activity, unspecified: Secondary | ICD-10-CM | POA: Insufficient documentation

## 2013-01-30 DIAGNOSIS — Y9289 Other specified places as the place of occurrence of the external cause: Secondary | ICD-10-CM | POA: Insufficient documentation

## 2013-01-30 DIAGNOSIS — G20A1 Parkinson's disease without dyskinesia, without mention of fluctuations: Secondary | ICD-10-CM | POA: Insufficient documentation

## 2013-01-30 DIAGNOSIS — Z8614 Personal history of Methicillin resistant Staphylococcus aureus infection: Secondary | ICD-10-CM | POA: Insufficient documentation

## 2013-01-30 DIAGNOSIS — M129 Arthropathy, unspecified: Secondary | ICD-10-CM | POA: Insufficient documentation

## 2013-01-30 LAB — POCT I-STAT, CHEM 8
Calcium, Ion: 1.09 mmol/L — ABNORMAL LOW (ref 1.13–1.30)
Creatinine, Ser: 1.3 mg/dL (ref 0.50–1.35)
Glucose, Bld: 101 mg/dL — ABNORMAL HIGH (ref 70–99)
HCT: 33 % — ABNORMAL LOW (ref 39.0–52.0)
Hemoglobin: 11.2 g/dL — ABNORMAL LOW (ref 13.0–17.0)
Potassium: 4.3 mEq/L (ref 3.5–5.1)

## 2013-01-30 LAB — URINALYSIS, ROUTINE W REFLEX MICROSCOPIC
Bilirubin Urine: NEGATIVE
Hgb urine dipstick: NEGATIVE
Nitrite: NEGATIVE
Protein, ur: 100 mg/dL — AB
Urobilinogen, UA: 0.2 mg/dL (ref 0.0–1.0)

## 2013-01-30 LAB — URINE MICROSCOPIC-ADD ON

## 2013-01-30 MED ORDER — LISINOPRIL 10 MG PO TABS
10.0000 mg | ORAL_TABLET | Freq: Once | ORAL | Status: AC
  Administered 2013-01-30: 10 mg via ORAL
  Filled 2013-01-30: qty 1

## 2013-01-30 MED ORDER — SULFAMETHOXAZOLE-TRIMETHOPRIM 800-160 MG PO TABS: 1.0000 | ORAL_TABLET | Freq: Two times a day (BID) | ORAL | Status: DC

## 2013-01-30 MED ORDER — SULFAMETHOXAZOLE-TMP DS 800-160 MG PO TABS
1.0000 | ORAL_TABLET | Freq: Once | ORAL | Status: AC
  Administered 2013-01-30: 1 via ORAL
  Filled 2013-01-30: qty 1

## 2013-01-30 MED ORDER — NAPROXEN 500 MG PO TABS: 500.0000 mg | ORAL_TABLET | Freq: Two times a day (BID) | ORAL | Status: DC

## 2013-01-30 NOTE — ED Notes (Signed)
Per EMS- Patient is a resident of [facility]. Patient had a fall and now has lacerations above both eyebrows. Bleeding controlled. Patient had no LOC.

## 2013-01-30 NOTE — ED Notes (Signed)
[facility] called for patient transfer 

## 2013-01-30 NOTE — ED Notes (Signed)
EKG given to EDP, Ghim,MD.

## 2013-01-30 NOTE — ED Provider Notes (Signed)
History     CSN: [id number]  Arrival date & time 01/30/13  1700   First MD Initiated Contact with Patient 01/30/13 1702      Chief Complaint  Patient presents with  . Fall  . Facial Laceration    (Consider location/radiation/quality/duration/timing/severity/associated sxs/prior treatment) HPI Comments: Pt is an elderly male with hx of parkinsons disease, CM, Htn who presents after having a fall at the NH.  Pt has no memory of how it happened but has evidence on his face of lacerations and a contusion to the scalp on the crown of the head.  He also c/o R knuckles pain.  He states that his pain is mild and wants no pain medicines.  He also denies any other c/o such as CP, SOB, HA, blurred vision or weakness.  Staff at the NH state that   Patient is a 78 y.o. male presenting with fall. The history is provided by the patient.  Fall    Past Medical History  Diagnosis Date  . Hearing aid worn   . Hypertension   . Parkinson disease   . Pneumonia   . Diabetes mellitus type II   . Arthritis   . Bipolar disorder   . Anxiety   . Depression   . MRSA infection (methicillin-resistant Staphylococcus aureus)   . Chronic indwelling foley catheter   . C2 cervical fracture     due to pt fall  . SIRS (systemic inflammatory response syndrome)   . Neuromuscular disorder     Past Surgical History  Procedure Laterality Date  . Total hip arthroplasty    . Video bronchoscopy  12/16/2012    Procedure: VIDEO BRONCHOSCOPY;  Surgeon: Loreli Slot, MD;  Location: [facility] OR;  Service: Thoracic;  Laterality: Right;  . Video assisted thoracoscopy (vats)/empyema  12/16/2012    Procedure: VIDEO ASSISTED THORACOSCOPY (VATS)/EMPYEMA;  Surgeon: Loreli Slot, MD;  Location: [facility] OR;  Service: Thoracic;  Laterality: Right;    Family History  Problem Relation Age of Onset  . Depression Father     History  Substance Use Topics  . Smoking status: Never Smoker   . Smokeless tobacco: Never  Used  . Alcohol Use: No     Comment: occasional      Review of Systems  All other systems reviewed and are negative.    Allergies  Cholestatin  Home Medications   Current Outpatient Rx  Name  Route  Sig  Dispense  Refill  . aspirin 81 MG chewable tablet   Oral   Chew 81 mg by mouth daily.         . carbidopa-levodopa (SINEMET) 25-250 MG per tablet   Oral   Take 1 tablet by mouth 3 (three) times daily.          . clonazePAM (KLONOPIN) 0.5 MG tablet   Oral   Take 0.5 mg by mouth 2 (two) times daily as needed. For anxiety         . Cranberry (AZO-CRANBERRY) 450 MG TABS   Oral   Take 1 tablet by mouth every morning.         . entacapone (COMTAN) 200 MG tablet   Oral   Take 200 mg by mouth 3 (three) times daily.          Marland Kitchen guaiFENesin (MUCINEX) 600 MG 12 hr tablet   Oral   Take 1,200 mg by mouth 2 (two) times daily as needed for congestion.          Marland Kitchen  lamoTRIgine (LAMICTAL) 150 MG tablet   Oral   Take 150 mg by mouth 2 (two) times daily.         . Multiple Vitamin (MULTIVITAMIN WITH MINERALS) TABS   Oral   Take 1 tablet by mouth every morning.          . naproxen (NAPROSYN) 375 MG tablet   Oral   Take 375 mg by mouth daily with breakfast.         . omeprazole (PRILOSEC) 20 MG capsule   Oral   Take 20 mg by mouth every morning.         . polyethylene glycol (MIRALAX / GLYCOLAX) packet   Oral   Take 17 g by mouth daily.         . QUEtiapine (SEROQUEL) 50 MG tablet   Oral   Take 50 mg by mouth at bedtime.         . Red Yeast Rice Extract (RED YEAST RICE PO)   Oral   Take 2 tablets by mouth at bedtime.           . sertraline (ZOLOFT) 50 MG tablet   Oral   Take 150 mg by mouth every morning.          . traZODone (DESYREL) 50 MG tablet   Oral   Take 50 mg by mouth at bedtime as needed. sleep         . Valerian Root 500 MG CAPS   Oral   Take 1 capsule by mouth at bedtime.         Marland Kitchen amoxicillin-clavulanate  (AUGMENTIN) 875-125 MG per tablet   Oral   Take 1 tablet by mouth every 12 (twelve) hours. For 1 month   60 tablet   0   . naproxen (NAPROSYN) 500 MG tablet   Oral   Take 1 tablet (500 mg total) by mouth 2 (two) times daily with a meal.   30 tablet   0   . sulfamethoxazole-trimethoprim (SEPTRA DS) 800-160 MG per tablet   Oral   Take 1 tablet by mouth every 12 (twelve) hours.   14 tablet   0     BP 241/100  Pulse 62  Temp(Src) 98 F (36.7 C) (Oral)  Resp 18  SpO2 97%  Physical Exam  Nursing note and vitals reviewed. Constitutional: He appears well-developed and well-nourished. No distress.  HENT:  Head: Normocephalic and atraumatic.  Mouth/Throat: Oropharynx is clear and moist. No oropharyngeal exudate.  Lacerations of the forehead over the R temple and the L forehead.  no facial tenderness, deformity, malocclusion or hemotympanum.  no battle's sign or racoon eyes.   Eyes: Conjunctivae and EOM are normal. Pupils are equal, round, and reactive to light. Right eye exhibits no discharge. Left eye exhibits no discharge. No scleral icterus.  Neck: No JVD present. No thyromegaly present.  Cardiovascular: Normal rate, regular rhythm, normal heart sounds and intact distal pulses.  Exam reveals no gallop and no friction rub.   No murmur heard. Pulmonary/Chest: Effort normal and breath sounds normal. No respiratory distress. He has no wheezes. He has no rales.  Abdominal: Soft. Bowel sounds are normal. He exhibits no distension and no mass. There is no tenderness.  Musculoskeletal: Normal range of motion. He exhibits no edema and no tenderness.  Small superficial lacerations above the R MCP joints of the 4th and 5th fingers.  Moves all joints without pain other than L shoulder which pt states is always in pain  from arthritis.  Lymphadenopathy:    He has no cervical adenopathy.  Neurological: He is alert. Coordination normal.  Speech is clear, movements are slow but calculated  without ataxia.  CN 3-12 intact and pupils are equal and EOMI  Skin: Skin is warm and dry. No rash noted. No erythema.  Lacerations as descxribed.  Psychiatric: He has a normal mood and affect. His behavior is normal.    ED Course  Procedures (including critical care time)  Labs Reviewed  URINALYSIS, ROUTINE W REFLEX MICROSCOPIC - Abnormal; Notable for the following:    APPearance CLOUDY (*)    Ketones, ur TRACE (*)    Protein, ur 100 (*)    Leukocytes, UA LARGE (*)    All other components within normal limits  POCT I-STAT, CHEM 8 - Abnormal; Notable for the following:    Glucose, Bld 101 (*)    Calcium, Ion 1.09 (*)    Hemoglobin 11.2 (*)    HCT 33.0 (*)    All other components within normal limits  URINE CULTURE  URINE MICROSCOPIC-ADD ON   Ct Head Wo Contrast  01/30/2013  *RADIOLOGY REPORT*  Clinical Data:  Post fall with lacerations above both eye brows, now with severe headache, neck pain  CT HEAD WITHOUT CONTRAST CT CERVICAL SPINE WITHOUT CONTRAST  Technique:  Multidetector CT imaging of the head and cervical spine was performed following the standard protocol without intravenous contrast.  Multiplanar CT image reconstructions of the cervical spine were also generated.  Comparison:  Head and C-spine CT - 09/06/2012; 08/20/2012  CT HEAD  Findings:  Grossly unchanged advanced atrophy with a 0 days active dilatation of the ventricular system.  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  Scattered periventricular hypodensities are grossly unchanged incompatible microvascular ischemic disease. Given background parenchymal abnormalities, there is no CT evidence of acute large territory infarct.  No intraparenchymal or extra- axial mass or hemorrhage.  No midline shift.  Unchanged size configuration of the ventricles and basilar cisterns.  Regional soft tissues are normal.  No displaced calvarial fracture. Incidental note is again made of underpneumatization the bilateral mastoid air  cells.  Limited visualization of the paranasal sinuses is otherwise normal.  IMPRESSION:  1.  No acute intracranial process. 2.  Advanced atrophy and microvascular ischemic disease. 3.  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  CT CERVICAL SPINE  Findings:  The C1 to the superior endplate of T2 is imaged.  There is a mild scoliotic curvature of the cervical spine, convex to the right, possibly positional.  No anterolisthesis or retrolisthesis.  The bilateral facets are normally aligned.  The dens is normally positioned between the lateral masses of C1.  Normal atlantodental atlantoaxial articulation.  No fracture or static subluxation of the cervical spine.  A prominent nutrient foramen is again seen within through the superior articular surface of the right lateral facet of the C2 vertebral body.  Cervical vertebral body heights are preserved. Prevertebral soft tissues are normal.  Mild to moderate multilevel cervical spine DDD, worse at C5 - C6 and to a lesser extent, C6 - C7 and C4 - C5 with disc space height loss, end plate irregularity and primarily anteriorly directed disc osteophyte complexes at these levels.  Limited visualization of the lung apices is normal.  Minimal vascular calcifications within the bilateral carotid bulbs and within the distal aspect of the right vertebral artery.  Regional soft tissues otherwise normal.  IMPRESSION: 1.  No fracture or  static subluxation of the cervical spine. 2.  Mild to moderate multilevel cervical spine DDD, worse at C5 - C6.   Original Report Authenticated By: Tacey Ruiz, MD    Ct Cervical Spine Wo Contrast  01/30/2013  *RADIOLOGY REPORT*  Clinical Data:  Post fall with lacerations above both eye brows, now with severe headache, neck pain  CT HEAD WITHOUT CONTRAST CT CERVICAL SPINE WITHOUT CONTRAST  Technique:  Multidetector CT imaging of the head and cervical spine was performed following the standard protocol without intravenous contrast.   Multiplanar CT image reconstructions of the cervical spine were also generated.  Comparison:  Head and C-spine CT - 09/06/2012; 08/20/2012  CT HEAD  Findings:  Grossly unchanged advanced atrophy with a 0 days active dilatation of the ventricular system.  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  Scattered periventricular hypodensities are grossly unchanged incompatible microvascular ischemic disease. Given background parenchymal abnormalities, there is no CT evidence of acute large territory infarct.  No intraparenchymal or extra- axial mass or hemorrhage.  No midline shift.  Unchanged size configuration of the ventricles and basilar cisterns.  Regional soft tissues are normal.  No displaced calvarial fracture. Incidental note is again made of underpneumatization the bilateral mastoid air cells.  Limited visualization of the paranasal sinuses is otherwise normal.  IMPRESSION:  1.  No acute intracranial process. 2.  Advanced atrophy and microvascular ischemic disease. 3.  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  CT CERVICAL SPINE  Findings:  The C1 to the superior endplate of T2 is imaged.  There is a mild scoliotic curvature of the cervical spine, convex to the right, possibly positional.  No anterolisthesis or retrolisthesis.  The bilateral facets are normally aligned.  The dens is normally positioned between the lateral masses of C1.  Normal atlantodental atlantoaxial articulation.  No fracture or static subluxation of the cervical spine.  A prominent nutrient foramen is again seen within through the superior articular surface of the right lateral facet of the C2 vertebral body.  Cervical vertebral body heights are preserved. Prevertebral soft tissues are normal.  Mild to moderate multilevel cervical spine DDD, worse at C5 - C6 and to a lesser extent, C6 - C7 and C4 - C5 with disc space height loss, end plate irregularity and primarily anteriorly directed disc osteophyte complexes at these  levels.  Limited visualization of the lung apices is normal.  Minimal vascular calcifications within the bilateral carotid bulbs and within the distal aspect of the right vertebral artery.  Regional soft tissues otherwise normal.  IMPRESSION: 1.  No fracture or static subluxation of the cervical spine. 2.  Mild to moderate multilevel cervical spine DDD, worse at C5 - C6.   Original Report Authenticated By: Tacey Ruiz, MD    Dg Shoulder Left  01/30/2013  *RADIOLOGY REPORT*  Clinical Data: Fall.  Shoulder pain.  LEFT SHOULDER - 2+ VIEW  Comparison: 06/13/2012.  Findings: Remote left rib fractures.  Marked glenohumeral joint degenerative changes with loose bodies. Moderate acromioclavicular joint degenerative changes.  No acute fracture or dislocation.  IMPRESSION: No acute fracture or dislocation.  Degenerative changes as noted above.   Original Report Authenticated By: Lacy Duverney, M.D.    Dg Hand Complete Right  01/30/2013  *RADIOLOGY REPORT*  Clinical Data: Fall.  Laceration along the metacarpophalangeal joints.  RIGHT HAND - COMPLETE 3+ VIEW  Comparison: None.  Findings: Peripheral spurring with some chronic osteophyte fragmentation noted along the interphalangeal joints.  There is considerable spurring in the heads of the second and third metacarpals.  Several well corticated ossification centers or fragments are present between the ulna and the lunate/triquetrum.  Mild minus ulnar variance.  No findings of lunate malacia. Spurring at the first carpal metacarpal articulation noted.  No acute fracture or foreign body identified. No chondrocalcinosis in the TFCC.  IMPRESSION:  1. Chronic-appearing fragmented spurring, without fracture or acute bony findings.   Original Report Authenticated By: Gaylyn Rong, M.D.      1. Laceration of face   2. Head injury   3. UTI (lower urinary tract infection)       MDM  Pt has several lacerations requiring repair to the head and face but the MCP  lacerations are tears and very s uperficial.  No underlying deformity of the hands.  He is on ASA but no other thinners, VS show some hypertension.  Pt appears stable, will image head and C spine, hand and shoulder.  LACERATION REPAIR Performed by: Vida Roller Authorized by: Vida Roller Consent: Verbal consent obtained. Risks and benefits: risks, benefits and alternatives were discussed Consent given by: patient Patient identity confirmed: provided demographic data Prepped and Draped in normal sterile fashion Wound explored  Laceration Location:  L forehead above eyebrow  Laceration Length: 3cm  No Foreign Bodies seen or palpated  Anesthesia: local infiltration  Local anesthetic: lidocaine 1% without epinephrine  Anesthetic total: 4 ml  Irrigation method: syringe Amount of cleaning: standard  Skin closure: 5-0 prolene  Number of sutures: 5  Technique: Simple interrupted  Patient tolerance: Patient tolerated the procedure well with no immediate complications.  LACERATION REPAIR Performed by: Vida Roller Authorized by: Vida Roller Consent: Verbal consent obtained. Risks and benefits: risks, benefits and alternatives were discussed Consent given by: patient Patient identity confirmed: provided demographic data Prepped and Draped in normal sterile fashion Wound explored  Laceration Location: R forehead  Laceration Length: 2.5cm  No Foreign Bodies seen or palpated  Anesthesia: local infiltration  Local anesthetic: lidocaine 1% without epinephrine  Anesthetic total: 3 ml  Irrigation method: syringe Amount of cleaning: standard  Skin closure: 5-0 prolene  Number of sutures: 4  Technique: Simple Interrupted.  Patient tolerance: Patient tolerated the procedure well with no immediate complications.  ED ECG REPORT  I personally interpreted this EKG   Date: 01/30/2013   Rate: 60  Rhythm: normal sinus rhythm  QRS Axis: left  Intervals: normal   ST/T Wave abnormalities: normal  Conduction Disutrbances:none  Narrative Interpretation: PRWP  Old EKG Reviewed: no old  UTI on labs - has been given Bactrim, home with same and f/u for suture removal.  No findings on CT to suggest signficant trauma.    Vida Roller, MD 01/30/13 2308

## 2013-01-30 NOTE — ED Notes (Signed)
Notified RN, Amie pt. Blood pressure elevated 209/89.

## 2013-01-31 ENCOUNTER — Emergency Department (HOSPITAL_COMMUNITY): Payer: Medicare Other

## 2013-01-31 ENCOUNTER — Encounter (HOSPITAL_COMMUNITY): Payer: Self-pay | Admitting: *Deleted

## 2013-01-31 ENCOUNTER — Inpatient Hospital Stay (HOSPITAL_COMMUNITY): Payer: Medicare Other

## 2013-01-31 ENCOUNTER — Inpatient Hospital Stay (HOSPITAL_COMMUNITY)
Admission: EM | Admit: 2013-01-31 | Discharge: 2013-02-03 | DRG: 092 | Disposition: A | Discharge status: Nursing Home (Includes ICF) | Payer: Medicare Other | Attending: Internal Medicine | Admitting: Internal Medicine

## 2013-01-31 DIAGNOSIS — Z9889 Other specified postprocedural states: Secondary | ICD-10-CM

## 2013-01-31 DIAGNOSIS — R4182 Altered mental status, unspecified: Secondary | ICD-10-CM

## 2013-01-31 DIAGNOSIS — F411 Generalized anxiety disorder: Secondary | ICD-10-CM | POA: Diagnosis present

## 2013-01-31 DIAGNOSIS — I313 Pericardial effusion (noninflammatory): Secondary | ICD-10-CM

## 2013-01-31 DIAGNOSIS — T4275XA Adverse effect of unspecified antiepileptic and sedative-hypnotic drugs, initial encounter: Secondary | ICD-10-CM | POA: Diagnosis present

## 2013-01-31 DIAGNOSIS — G20A1 Parkinson's disease without dyskinesia, without mention of fluctuations: Secondary | ICD-10-CM | POA: Diagnosis present

## 2013-01-31 DIAGNOSIS — E119 Type 2 diabetes mellitus without complications: Secondary | ICD-10-CM

## 2013-01-31 DIAGNOSIS — N390 Urinary tract infection, site not specified: Secondary | ICD-10-CM | POA: Diagnosis present

## 2013-01-31 DIAGNOSIS — E870 Hyperosmolality and hypernatremia: Secondary | ICD-10-CM

## 2013-01-31 DIAGNOSIS — F329 Major depressive disorder, single episode, unspecified: Secondary | ICD-10-CM | POA: Diagnosis present

## 2013-01-31 DIAGNOSIS — F419 Anxiety disorder, unspecified: Secondary | ICD-10-CM

## 2013-01-31 DIAGNOSIS — J869 Pyothorax without fistula: Secondary | ICD-10-CM

## 2013-01-31 DIAGNOSIS — G9341 Metabolic encephalopathy: Secondary | ICD-10-CM

## 2013-01-31 DIAGNOSIS — I498 Other specified cardiac arrhythmias: Secondary | ICD-10-CM | POA: Diagnosis present

## 2013-01-31 DIAGNOSIS — Z9181 History of falling: Secondary | ICD-10-CM

## 2013-01-31 DIAGNOSIS — J90 Pleural effusion, not elsewhere classified: Secondary | ICD-10-CM | POA: Diagnosis present

## 2013-01-31 DIAGNOSIS — Z79899 Other long term (current) drug therapy: Secondary | ICD-10-CM

## 2013-01-31 DIAGNOSIS — R001 Bradycardia, unspecified: Secondary | ICD-10-CM | POA: Diagnosis present

## 2013-01-31 DIAGNOSIS — G92 Toxic encephalopathy: Principal | ICD-10-CM

## 2013-01-31 DIAGNOSIS — F3289 Other specified depressive episodes: Secondary | ICD-10-CM | POA: Diagnosis present

## 2013-01-31 DIAGNOSIS — R7881 Bacteremia: Secondary | ICD-10-CM

## 2013-01-31 DIAGNOSIS — M1990 Unspecified osteoarthritis, unspecified site: Secondary | ICD-10-CM

## 2013-01-31 DIAGNOSIS — G709 Myoneural disorder, unspecified: Secondary | ICD-10-CM

## 2013-01-31 DIAGNOSIS — Z974 Presence of external hearing-aid: Secondary | ICD-10-CM

## 2013-01-31 DIAGNOSIS — I5032 Chronic diastolic (congestive) heart failure: Secondary | ICD-10-CM

## 2013-01-31 DIAGNOSIS — G20 Parkinson's disease: Secondary | ICD-10-CM

## 2013-01-31 DIAGNOSIS — IMO0002 Reserved for concepts with insufficient information to code with codable children: Secondary | ICD-10-CM | POA: Diagnosis present

## 2013-01-31 DIAGNOSIS — D638 Anemia in other chronic diseases classified elsewhere: Secondary | ICD-10-CM | POA: Diagnosis present

## 2013-01-31 DIAGNOSIS — G9340 Encephalopathy, unspecified: Secondary | ICD-10-CM | POA: Diagnosis present

## 2013-01-31 DIAGNOSIS — J189 Pneumonia, unspecified organism: Secondary | ICD-10-CM

## 2013-01-31 DIAGNOSIS — I10 Essential (primary) hypertension: Secondary | ICD-10-CM | POA: Diagnosis present

## 2013-01-31 DIAGNOSIS — I3139 Other pericardial effusion (noninflammatory): Secondary | ICD-10-CM

## 2013-01-31 DIAGNOSIS — Z96649 Presence of unspecified artificial hip joint: Secondary | ICD-10-CM

## 2013-01-31 DIAGNOSIS — G929 Unspecified toxic encephalopathy: Principal | ICD-10-CM | POA: Diagnosis present

## 2013-01-31 DIAGNOSIS — F319 Bipolar disorder, unspecified: Secondary | ICD-10-CM

## 2013-01-31 DIAGNOSIS — I509 Heart failure, unspecified: Secondary | ICD-10-CM | POA: Diagnosis present

## 2013-01-31 DIAGNOSIS — N179 Acute kidney failure, unspecified: Secondary | ICD-10-CM

## 2013-01-31 LAB — CBC WITH DIFFERENTIAL/PLATELET
Basophils Absolute: 0 10*3/uL (ref 0.0–0.1)
Basophils Relative: 1 % (ref 0–1)
Eosinophils Relative: 4 % (ref 0–5)
HCT: 39.6 % (ref 39.0–52.0)
MCHC: 32.6 g/dL (ref 30.0–36.0)
MCV: 88.2 fL (ref 78.0–100.0)
Monocytes Absolute: 0.5 10*3/uL (ref 0.1–1.0)
Platelets: 279 10*3/uL (ref 150–400)
RDW: 16.9 % — ABNORMAL HIGH (ref 11.5–15.5)
WBC: 8.1 10*3/uL (ref 4.0–10.5)

## 2013-01-31 LAB — COMPREHENSIVE METABOLIC PANEL
ALT: 12 U/L (ref 0–53)
Alkaline Phosphatase: 102 U/L (ref 39–117)
BUN: 22 mg/dL (ref 6–23)
CO2: 25 mEq/L (ref 19–32)
Calcium: 9.3 mg/dL (ref 8.4–10.5)
GFR calc Af Amer: 60 mL/min — ABNORMAL LOW (ref >90)
GFR calc non Af Amer: 52 mL/min — ABNORMAL LOW (ref >90)
Glucose, Bld: 88 mg/dL (ref 70–99)
Sodium: 142 mEq/L (ref 135–145)

## 2013-01-31 LAB — POCT I-STAT TROPONIN I: Troponin i, poc: 0.02 ng/mL (ref 0.00–0.08)

## 2013-01-31 MED ORDER — ONDANSETRON HCL 4 MG PO TABS: 4.0000 mg | ORAL_TABLET | Freq: Four times a day (QID) | ORAL | Status: DC | PRN

## 2013-01-31 MED ORDER — PANTOPRAZOLE SODIUM 40 MG PO TBEC
40.0000 mg | DELAYED_RELEASE_TABLET | Freq: Every day | ORAL | Status: DC
  Administered 2013-01-31 – 2013-02-03 (×4): 40 mg via ORAL
  Filled 2013-01-31 (×4): qty 1

## 2013-01-31 MED ORDER — POLYETHYLENE GLYCOL 3350 17 G PO PACK
17.0000 g | PACK | Freq: Every day | ORAL | Status: DC
  Administered 2013-01-31 – 2013-02-03 (×3): 17 g via ORAL
  Filled 2013-01-31 (×4): qty 1

## 2013-01-31 MED ORDER — SODIUM CHLORIDE 0.9 % IV SOLN
INTRAVENOUS | Status: AC
  Administered 2013-01-31: 15:00:00 via INTRAVENOUS

## 2013-01-31 MED ORDER — ENTACAPONE 200 MG PO TABS
200.0000 mg | ORAL_TABLET | Freq: Three times a day (TID) | ORAL | Status: DC
  Administered 2013-01-31 – 2013-02-03 (×9): 200 mg via ORAL
  Filled 2013-01-31 (×11): qty 1

## 2013-01-31 MED ORDER — ACETAMINOPHEN 325 MG PO TABS: 650.0000 mg | ORAL_TABLET | Freq: Four times a day (QID) | ORAL | Status: DC | PRN

## 2013-01-31 MED ORDER — CLONAZEPAM 0.5 MG PO TABS
0.5000 mg | ORAL_TABLET | Freq: Two times a day (BID) | ORAL | Status: DC | PRN
  Administered 2013-01-31 – 2013-02-02 (×3): 0.5 mg via ORAL
  Filled 2013-01-31 (×4): qty 1

## 2013-01-31 MED ORDER — DEXTROSE 5 % IV SOLN
1.0000 g | Freq: Once | INTRAVENOUS | Status: AC
  Administered 2013-01-31: 1 g via INTRAVENOUS
  Filled 2013-01-31: qty 10

## 2013-01-31 MED ORDER — HEPARIN SODIUM (PORCINE) 5000 UNIT/ML IJ SOLN
5000.0000 [IU] | Freq: Three times a day (TID) | INTRAMUSCULAR | Status: DC
  Administered 2013-01-31 – 2013-02-03 (×9): 5000 [IU] via SUBCUTANEOUS
  Filled 2013-01-31 (×11): qty 1

## 2013-01-31 MED ORDER — ONDANSETRON HCL 4 MG/2ML IJ SOLN: 4.0000 mg | Freq: Four times a day (QID) | INTRAMUSCULAR | Status: DC | PRN

## 2013-01-31 MED ORDER — ASPIRIN 81 MG PO CHEW
81.0000 mg | CHEWABLE_TABLET | Freq: Every day | ORAL | Status: DC
  Administered 2013-01-31 – 2013-02-03 (×4): 81 mg via ORAL
  Filled 2013-01-31 (×5): qty 1

## 2013-01-31 MED ORDER — SERTRALINE HCL 50 MG PO TABS
150.0000 mg | ORAL_TABLET | Freq: Every morning | ORAL | Status: DC
  Administered 2013-02-01 – 2013-02-03 (×3): 150 mg via ORAL
  Filled 2013-01-31 (×3): qty 1

## 2013-01-31 MED ORDER — DEXTROSE 5 % IV SOLN
1.0000 g | INTRAVENOUS | Status: DC
  Administered 2013-02-01: 1 g via INTRAVENOUS
  Filled 2013-01-31 (×2): qty 10

## 2013-01-31 MED ORDER — LAMOTRIGINE 150 MG PO TABS
150.0000 mg | ORAL_TABLET | Freq: Two times a day (BID) | ORAL | Status: DC
  Administered 2013-01-31 – 2013-02-03 (×6): 150 mg via ORAL
  Filled 2013-01-31 (×7): qty 1

## 2013-01-31 MED ORDER — FLUCONAZOLE 100MG IVPB
100.0000 mg | INTRAVENOUS | Status: DC
  Administered 2013-01-31 – 2013-02-01 (×2): 100 mg via INTRAVENOUS
  Filled 2013-01-31 (×3): qty 50

## 2013-01-31 MED ORDER — ALBUTEROL SULFATE (5 MG/ML) 0.5% IN NEBU: 2.5000 mg | INHALATION_SOLUTION | RESPIRATORY_TRACT | Status: DC | PRN

## 2013-01-31 MED ORDER — ACETAMINOPHEN 650 MG RE SUPP: 650.0000 mg | Freq: Four times a day (QID) | RECTAL | Status: DC | PRN

## 2013-01-31 MED ORDER — CARBIDOPA-LEVODOPA 25-250 MG PO TABS
1.0000 | ORAL_TABLET | Freq: Three times a day (TID) | ORAL | Status: DC
  Administered 2013-01-31 – 2013-02-03 (×9): 1 via ORAL
  Filled 2013-01-31 (×11): qty 1

## 2013-01-31 MED ORDER — SODIUM CHLORIDE 0.9 % IV SOLN: INTRAVENOUS | Status: AC

## 2013-01-31 MED ORDER — SODIUM CHLORIDE 0.9 % IJ SOLN
3.0000 mL | Freq: Two times a day (BID) | INTRAMUSCULAR | Status: DC
  Administered 2013-02-01: 3 mL via INTRAVENOUS
  Administered 2013-02-01: 22:00:00 via INTRAVENOUS
  Administered 2013-02-02 – 2013-02-03 (×2): 3 mL via INTRAVENOUS

## 2013-01-31 NOTE — ED Notes (Signed)
Per EMS Pt fell at [facility]; pt here last pm as well after fall and received stitches; pt fell again last pm.  Per EMS pt does not remember fall; pt does not remember fall.

## 2013-01-31 NOTE — H&P (Signed)
[facility] History and Physical  [name]. AVW:[id number] DOB: 1935-12-03 DOA: 01/31/2013   PCP: [name], MD   Chief Complaint: Falls and confusion  HPI: [name]. is a 78 y.o. male who lives in an assisted living facility. He has a history of Parkinson's disease, frequent UTI, indwelling Foley catheter and possibly some behavioral issues. He has had multiple visits to the ED for problems with his Foley catheter. His been diagnosed with UTI multiple times. He presented yesterday to the emergency department after a fall. He had a small laceration on the scalp. CT head was unremarkable. Patient was sent back to the assisted living facility. However, patient subsequently, had another fall. Apparently, he was very confused at the assisted living facility. So, they sent him back to the ED. At baseline patient doesn't really walk without assistance. However, in the last 24 hours he has tried to do the same. Patient is very confused currently. He is unable to answer any of my questions. So history is very limited. Denies any pain, however. Is able to follow certain commands.  Home Medications: Prior to Admission medications   Medication Sig Start Date End Date Taking? Authorizing Provider  aspirin 81 MG chewable tablet Chew 81 mg by mouth daily.   Yes Historical Provider, MD  carbidopa-levodopa (SINEMET) 25-250 MG per tablet Take 1 tablet by mouth 3 (three) times daily.    Yes Historical Provider, MD  clonazePAM (KLONOPIN) 0.5 MG tablet Take 0.5 mg by mouth 2 (two) times daily as needed. For anxiety   Yes Historical Provider, MD  Cranberry (AZO-CRANBERRY) 450 MG TABS Take 1 tablet by mouth every morning.   Yes Historical Provider, MD  entacapone (COMTAN) 200 MG tablet Take 200 mg by mouth 3 (three) times daily.  12/15/11  Yes Historical Provider, MD  guaiFENesin (MUCINEX) 600 MG 12 hr tablet Take 1,200 mg by mouth 2 (two) times daily as needed for congestion.    Yes  Historical Provider, MD  lamoTRIgine (LAMICTAL) 150 MG tablet Take 150 mg by mouth 2 (two) times daily.   Yes Historical Provider, MD  Multiple Vitamin (MULTIVITAMIN WITH MINERALS) TABS Take 1 tablet by mouth every morning.    Yes Historical Provider, MD  naproxen (NAPROSYN) 375 MG tablet Take 375 mg by mouth daily with breakfast.   Yes Historical Provider, MD  naproxen (NAPROSYN) 500 MG tablet Take 1 tablet (500 mg total) by mouth 2 (two) times daily with a meal. 01/30/13  Yes Vida Roller, MD  omeprazole (PRILOSEC) 20 MG capsule Take 20 mg by mouth every morning.   Yes Historical Provider, MD  polyethylene glycol (MIRALAX / GLYCOLAX) packet Take 17 g by mouth daily.   Yes Historical Provider, MD  QUEtiapine (SEROQUEL) 50 MG tablet Take 50 mg by mouth at bedtime.   Yes Historical Provider, MD  Red Yeast Rice Extract (RED YEAST RICE PO) Take 2 tablets by mouth at bedtime.     Yes Historical Provider, MD  sertraline (ZOLOFT) 50 MG tablet Take 150 mg by mouth every morning.    Yes Historical Provider, MD  traZODone (DESYREL) 50 MG tablet Take 50 mg by mouth at bedtime as needed. sleep   Yes Historical Provider, MD  Valerian Root 500 MG CAPS Take 1 capsule by mouth at bedtime.   Yes Historical Provider, MD    Allergies:  Allergies  Allergen Reactions  . Cholestatin Other (See Comments)    Per MAR    Past Medical History:  Past Medical History  Diagnosis Date  . Hearing aid worn   . Hypertension   . Parkinson disease   . Pneumonia   . Diabetes mellitus type II   . Arthritis   . Bipolar disorder   . Anxiety   . Depression   . MRSA infection (methicillin-resistant Staphylococcus aureus)   . Chronic indwelling foley catheter   . C2 cervical fracture     due to pt fall  . SIRS (systemic inflammatory response syndrome)   . Neuromuscular disorder     Past Surgical History  Procedure Laterality Date  . Total hip arthroplasty    . Video bronchoscopy  12/16/2012    Procedure: VIDEO  BRONCHOSCOPY;  Surgeon: Loreli Slot, MD;  Location: [facility] OR;  Service: Thoracic;  Laterality: Right;  . Video assisted thoracoscopy (vats)/empyema  12/16/2012    Procedure: VIDEO ASSISTED THORACOSCOPY (VATS)/EMPYEMA;  Surgeon: Loreli Slot, MD;  Location[facility] OR;  Service: Thoracic;  Laterality: Right;    Social History:  reports that he has never smoked. He has never used smokeless tobacco. He reports that he does not drink alcohol or use illicit drugs.  Living Situation: Lives in an assisted living facility Activity Level: Is unclear if he uses any devices at baseline   Family History: Unable to obtain due to his confusion Review of Systems - Unable to obtain due to his confusion  Physical Examination  Filed Vitals:   01/31/13 1024 01/31/13 1138 01/31/13 1546  BP: 191/90 183/88 170/110  Pulse: 51 49 54  Temp: 98.5 F (36.9 C)  98 F (36.7 C)  TempSrc: Oral  Oral  Resp:  12 12  SpO2: 97% 97% 96%    General appearance: alert, cooperative, appears stated age and no distress Head: Bruising noted on the scalp Eyes: conjunctivae/corneas clear. PERRL, EOM's intact. Throat: lips, mucosa, and tongue normal; teeth and gums normal Neck: no adenopathy, no carotid bruit, no JVD, supple, symmetrical, trachea midline and thyroid not enlarged, symmetric, no tenderness/mass/nodules Resp: clear to auscultation bilaterally Cardio: regular rate and rhythm, S1, S2 normal, no murmur, click, rub or gallop GI: soft, non-tender; bowel sounds normal; no masses,  no organomegaly Extremities: extremities normal, atraumatic, no cyanosis or edema Pulses: 2+ and symmetric Skin: Skin color, texture, turgor normal. No rashes or lesions Lymph nodes: Cervical, supraclavicular, and axillary nodes normal. Neurologic: He is alert. Confused and disoriented. No cranial nerve deficits. No motor strength deficits. Was able to lift both legs off the bed.  Laboratory Data: Results for orders placed  during the hospital encounter of 01/31/13 (from the past 48 hour(s))  CBC WITH DIFFERENTIAL     Status: Abnormal   Collection Time    01/31/13  1:25 PM      Result Value Range   WBC 8.1  4.0 - 10.5 K/uL   RBC 4.49  4.22 - 5.81 MIL/uL   Hemoglobin 12.9 (*) 13.0 - 17.0 g/dL   HCT 16.1  09.6 - 04.5 %   MCV 88.2  78.0 - 100.0 fL   MCH 28.7  26.0 - 34.0 pg   MCHC 32.6  30.0 - 36.0 g/dL   RDW 40.9 (*) 81.1 - 91.4 %   Platelets 279  150 - 400 K/uL   Neutrophils Relative 74  43 - 77 %   Neutro Abs 6.0  1.7 - 7.7 K/uL   Lymphocytes Relative 16  12 - 46 %   Lymphs Abs 1.3  0.7 - 4.0 K/uL   Monocytes  Relative 6  3 - 12 %   Monocytes Absolute 0.5  0.1 - 1.0 K/uL   Eosinophils Relative 4  0 - 5 %   Eosinophils Absolute 0.3  0.0 - 0.7 K/uL   Basophils Relative 1  0 - 1 %   Basophils Absolute 0.0  0.0 - 0.1 K/uL  POCT I-STAT TROPONIN I     Status: None   Collection Time    01/31/13  1:29 PM      Result Value Range   Troponin i, poc 0.02  0.00 - 0.08 ng/mL   Comment 3            Comment: Due to the release kinetics of cTnI,     a negative result within the first hours     of the onset of symptoms does not rule out     myocardial infarction with certainty.     If myocardial infarction is still suspected,     repeat the test at appropriate intervals.    Radiology Reports: Dg Thoracic Spine 2 View  01/31/2013  *RADIOLOGY REPORT*  Clinical Data: Larey Seat today with mid to lower back pain  THORACIC SPINE - 2 VIEW  Comparison: Chest x-ray of 01/07/2013  Findings: The thoracic vertebrae are in normal alignment with mild degenerative change in the lower thoracic spine.  No acute compression deformity is seen.  No paravertebral soft tissue swelling is noted. The bones are noted to be somewhat osteopenic. The nodular prominent right hilum again is noted on the frontal view.  IMPRESSION: Osteopenia and degenerative change.  No acute compression deformity.   Original Report Authenticated By: Dwyane Dee, M.D.     Dg Lumbar Spine Complete  01/31/2013  *RADIOLOGY REPORT*  Clinical Data: Larey Seat today with mid to low back pain  LUMBAR SPINE - COMPLETE 4+ VIEW  Comparison: None  Findings: The lumbar vertebrae are in normal alignment.  There is diffuse degenerative change particularly in the lower thoracic and upper lumbar spine.  There is degenerative disc disease at L1-2, L2- 3 and L3-4 levels.  No compression deformity is seen.  Degenerative change also is noted involving the facet joints throughout the lumbar spine. Bilateral total hip replacements are partially visualized.  IMPRESSION: Mild lumbar scoliosis with degenerative change.  No acute compression deformity.   Original Report Authenticated By: Dwyane Dee, M.D.    Ct Head Wo Contrast  01/30/2013  *RADIOLOGY REPORT*  Clinical Data:  Post fall with lacerations above both eye brows, now with severe headache, neck pain  CT HEAD WITHOUT CONTRAST CT CERVICAL SPINE WITHOUT CONTRAST  Technique:  Multidetector CT imaging of the head and cervical spine was performed following the standard protocol without intravenous contrast.  Multiplanar CT image reconstructions of the cervical spine were also generated.  Comparison:  Head and C-spine CT - 09/06/2012; 08/20/2012  CT HEAD  Findings:  Grossly unchanged advanced atrophy with a 0 days active dilatation of the ventricular system.  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  Scattered periventricular hypodensities are grossly unchanged incompatible microvascular ischemic disease. Given background parenchymal abnormalities, there is no CT evidence of acute large territory infarct.  No intraparenchymal or extra- axial mass or hemorrhage.  No midline shift.  Unchanged size configuration of the ventricles and basilar cisterns.  Regional soft tissues are normal.  No displaced calvarial fracture. Incidental note is again made of underpneumatization the bilateral mastoid air cells.  Limited visualization of the paranasal  sinuses is otherwise normal.  IMPRESSION:  1.  No acute intracranial process. 2.  Advanced atrophy and microvascular ischemic disease. 3.  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  CT CERVICAL SPINE  Findings:  The C1 to the superior endplate of T2 is imaged.  There is a mild scoliotic curvature of the cervical spine, convex to the right, possibly positional.  No anterolisthesis or retrolisthesis.  The bilateral facets are normally aligned.  The dens is normally positioned between the lateral masses of C1.  Normal atlantodental atlantoaxial articulation.  No fracture or static subluxation of the cervical spine.  A prominent nutrient foramen is again seen within through the superior articular surface of the right lateral facet of the C2 vertebral body.  Cervical vertebral body heights are preserved. Prevertebral soft tissues are normal.  Mild to moderate multilevel cervical spine DDD, worse at C5 - C6 and to a lesser extent, C6 - C7 and C4 - C5 with disc space height loss, end plate irregularity and primarily anteriorly directed disc osteophyte complexes at these levels.  Limited visualization of the lung apices is normal.  Minimal vascular calcifications within the bilateral carotid bulbs and within the distal aspect of the right vertebral artery.  Regional soft tissues otherwise normal.  IMPRESSION: 1.  No fracture or static subluxation of the cervical spine. 2.  Mild to moderate multilevel cervical spine DDD, worse at C5 - C6.   Original Report Authenticated By: Tacey Ruiz, MD    Ct Cervical Spine Wo Contrast  01/30/2013  *RADIOLOGY REPORT*  Clinical Data:  Post fall with lacerations above both eye brows, now with severe headache, neck pain  CT HEAD WITHOUT CONTRAST CT CERVICAL SPINE WITHOUT CONTRAST  Technique:  Multidetector CT imaging of the head and cervical spine was performed following the standard protocol without intravenous contrast.  Multiplanar CT image reconstructions of the cervical  spine were also generated.  Comparison:  Head and C-spine CT - 09/06/2012; 08/20/2012  CT HEAD  Findings:  Grossly unchanged advanced atrophy with a 0 days active dilatation of the ventricular system.  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  Scattered periventricular hypodensities are grossly unchanged incompatible microvascular ischemic disease. Given background parenchymal abnormalities, there is no CT evidence of acute large territory infarct.  No intraparenchymal or extra- axial mass or hemorrhage.  No midline shift.  Unchanged size configuration of the ventricles and basilar cisterns.  Regional soft tissues are normal.  No displaced calvarial fracture. Incidental note is again made of underpneumatization the bilateral mastoid air cells.  Limited visualization of the paranasal sinuses is otherwise normal.  IMPRESSION:  1.  No acute intracranial process. 2.  Advanced atrophy and microvascular ischemic disease. 3.  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  CT CERVICAL SPINE  Findings:  The C1 to the superior endplate of T2 is imaged.  There is a mild scoliotic curvature of the cervical spine, convex to the right, possibly positional.  No anterolisthesis or retrolisthesis.  The bilateral facets are normally aligned.  The dens is normally positioned between the lateral masses of C1.  Normal atlantodental atlantoaxial articulation.  No fracture or static subluxation of the cervical spine.  A prominent nutrient foramen is again seen within through the superior articular surface of the right lateral facet of the C2 vertebral body.  Cervical vertebral body heights are preserved. Prevertebral soft tissues are normal.  Mild to moderate multilevel cervical spine DDD, worse at C5 - C6 and to a lesser  extent, C6 - C7 and C4 - C5 with disc space height loss, end plate irregularity and primarily anteriorly directed disc osteophyte complexes at these levels.  Limited visualization of the lung apices is  normal.  Minimal vascular calcifications within the bilateral carotid bulbs and within the distal aspect of the right vertebral artery.  Regional soft tissues otherwise normal.  IMPRESSION: 1.  No fracture or static subluxation of the cervical spine. 2.  Mild to moderate multilevel cervical spine DDD, worse at C5 - C6.   Original Report Authenticated By: Tacey Ruiz, MD    Dg Shoulder Left  01/30/2013  *RADIOLOGY REPORT*  Clinical Data: Fall.  Shoulder pain.  LEFT SHOULDER - 2+ VIEW  Comparison: 06/13/2012.  Findings: Remote left rib fractures.  Marked glenohumeral joint degenerative changes with loose bodies. Moderate acromioclavicular joint degenerative changes.  No acute fracture or dislocation.  IMPRESSION: No acute fracture or dislocation.  Degenerative changes as noted above.   Original Report Authenticated By: Lacy Duverney, M.D.    Dg Hand Complete Right  01/30/2013  *RADIOLOGY REPORT*  Clinical Data: Fall.  Laceration along the metacarpophalangeal joints.  RIGHT HAND - COMPLETE 3+ VIEW  Comparison: None.  Findings: Peripheral spurring with some chronic osteophyte fragmentation noted along the interphalangeal joints.  There is considerable spurring in the heads of the second and third metacarpals.  Several well corticated ossification centers or fragments are present between the ulna and the lunate/triquetrum.  Mild minus ulnar variance.  No findings of lunate malacia. Spurring at the first carpal metacarpal articulation noted.  No acute fracture or foreign body identified. No chondrocalcinosis in the TFCC.  IMPRESSION:  1. Chronic-appearing fragmented spurring, without fracture or acute bony findings.   Original Report Authenticated By: Gaylyn Rong, M.D.     Electrocardiogram: Sinus rhythm. Normal axis. Intervals are normal. No concerning ST or T-wave changes are noted.  Problem List  Principal Problem:   Acute encephalopathy Active Problems:   UTI (lower urinary tract infection)    Parkinson disease   Assessment: This is a 78 year old, Caucasian male, with a past medical history of Parkinson's disease, frequent UTIs. He actually had an empyema in December and was hospitalized at [facility]. He underwent a VATS procedure. Comes in after 2 falls at his assisted living facility. He has been more confused. Confusion/encephalopathy could be due to the UTI. CT of the head done last evening did not show any acute findings. It's possible that he may have some cognitive, impairment as well.  Plan: #1 acute encephalopathy: We will proceed with MRI of his brain. Reorients multiple times. Treat his infection. Check a B12, TSH, RPR levels. Hold the Seroquel for now. We'll also hold his trazodone. Check lft  #2. urinary tract infection with a chronic indwelling Foley: Treated with ceftriaxone. Previous cultures have grown yeast, so, we will also treat him with Diflucan. Await culture results. Reason for the chronic indwelling Foley is not known. The Foley was changed recently in the emergency department.  #3 history of Parkinson's disease: Continue with his home medications for the same.  #4 sinus bradycardia: Appears to be asymptomatic. Monitor him on telemetry. Check TSH.  Will have physical therapy and occupational therapy see him. He'll be kept on a dysphagia 3 diet.   DVT Prophylaxis: Heparin Code Status: Full code Family Communication: No family at bedside  Disposition Plan: He may require higher level of care. Depending on his progress.  Further management decisions will depend on results of further testing  and patient's response to treatment.  [facility]  [facility] Pager [phone]  If 7PM-7AM, please contact night-coverage [email or web address] Password [email or web address]  01/31/2013, 4:35 PM

## 2013-01-31 NOTE — ED Provider Notes (Signed)
History     CSN: [id number]  Arrival date & time 01/31/13  1003   First MD Initiated Contact with Patient 01/31/13 1031      Chief Complaint  Patient presents with  . Fall    (Consider location/radiation/quality/duration/timing/severity/associated sxs/prior treatment) HPI Comments: Patient with Hx of parkinson's and HTN presents via EMS after fall in nursing home this morning. Patient unable to recall how he fell/what caused him to fall today. Nurse states patient complained of pain between his should blades. Upon questioning, however, patient admits to pain in his mid back. Patient unsure about syncope. Denies CP, SOB, headache, vision changes, N/V and numbness or tingling in extremities. Answers questions appropriately. Patient was seen in ED yesterday after fall as well; has visible sutured laceration over left eye and on right forehead from yesterday's fall.   The history is provided by the patient. No language interpreter was used.    Past Medical History  Diagnosis Date  . Hearing aid worn   . Hypertension   . Parkinson disease   . Pneumonia   . Diabetes mellitus type II   . Arthritis   . Bipolar disorder   . Anxiety   . Depression   . MRSA infection (methicillin-resistant Staphylococcus aureus)   . Chronic indwelling foley catheter   . C2 cervical fracture     due to pt fall  . SIRS (systemic inflammatory response syndrome)   . Neuromuscular disorder     Past Surgical History  Procedure Laterality Date  . Total hip arthroplasty    . Video bronchoscopy  12/16/2012    Procedure: VIDEO BRONCHOSCOPY;  Surgeon: Loreli Slot, MD;  Location: [facility] OR;  Service: Thoracic;  Laterality: Right;  . Video assisted thoracoscopy (vats)/empyema  12/16/2012    Procedure: VIDEO ASSISTED THORACOSCOPY (VATS)/EMPYEMA;  Surgeon: Loreli Slot, MD;  Location: [facility] OR;  Service: Thoracic;  Laterality: Right;    Family History  Problem Relation Age of Onset  . Depression  Father     History  Substance Use Topics  . Smoking status: Never Smoker   . Smokeless tobacco: Never Used  . Alcohol Use: No     Comment: occasional      Review of Systems  Constitutional: Negative for diaphoresis.  HENT: Negative for neck pain.   Eyes: Negative for visual disturbance.  Respiratory: Negative for shortness of breath.   Cardiovascular: Negative for chest pain.  Gastrointestinal: Negative for nausea and vomiting.  Musculoskeletal: Positive for back pain.  Neurological: Negative for numbness and headaches.  Psychiatric/Behavioral: Negative for confusion.  All other systems reviewed and are negative.    Allergies  Cholestatin  Home Medications   Current Outpatient Rx  Name  Route  Sig  Dispense  Refill  . aspirin 81 MG chewable tablet   Oral   Chew 81 mg by mouth daily.         . carbidopa-levodopa (SINEMET) 25-250 MG per tablet   Oral   Take 1 tablet by mouth 3 (three) times daily.          . clonazePAM (KLONOPIN) 0.5 MG tablet   Oral   Take 0.5 mg by mouth 2 (two) times daily as needed. For anxiety         . Cranberry (AZO-CRANBERRY) 450 MG TABS   Oral   Take 1 tablet by mouth every morning.         . entacapone (COMTAN) 200 MG tablet   Oral   Take 200 mg  by mouth 3 (three) times daily.          Marland Kitchen guaiFENesin (MUCINEX) 600 MG 12 hr tablet   Oral   Take 1,200 mg by mouth 2 (two) times daily as needed for congestion.          Marland Kitchen lamoTRIgine (LAMICTAL) 150 MG tablet   Oral   Take 150 mg by mouth 2 (two) times daily.         . Multiple Vitamin (MULTIVITAMIN WITH MINERALS) TABS   Oral   Take 1 tablet by mouth every morning.          . naproxen (NAPROSYN) 375 MG tablet   Oral   Take 375 mg by mouth daily with breakfast.         . naproxen (NAPROSYN) 500 MG tablet   Oral   Take 1 tablet (500 mg total) by mouth 2 (two) times daily with a meal.   30 tablet   0   . omeprazole (PRILOSEC) 20 MG capsule   Oral   Take 20  mg by mouth every morning.         . polyethylene glycol (MIRALAX / GLYCOLAX) packet   Oral   Take 17 g by mouth daily.         . QUEtiapine (SEROQUEL) 50 MG tablet   Oral   Take 50 mg by mouth at bedtime.         . Red Yeast Rice Extract (RED YEAST RICE PO)   Oral   Take 2 tablets by mouth at bedtime.           . sertraline (ZOLOFT) 50 MG tablet   Oral   Take 150 mg by mouth every morning.          . traZODone (DESYREL) 50 MG tablet   Oral   Take 50 mg by mouth at bedtime as needed. sleep         . Valerian Root 500 MG CAPS   Oral   Take 1 capsule by mouth at bedtime.           BP 170/110  Pulse 54  Temp(Src) 98 F (36.7 C) (Oral)  Resp 12  SpO2 96%  Physical Exam  Nursing note and vitals reviewed. Constitutional: He is oriented to person, place, and time. No distress.  HENT:  Head: Normocephalic.  Visible sutured laceration above left eye and on right side of forehead; mild ecchymosis under left eye  Eyes: Conjunctivae are normal. No scleral icterus.  No hyphema  Neck:  No midline tenderness  Cardiovascular: Normal rate, regular rhythm and normal heart sounds.   Pulmonary/Chest: Effort normal and breath sounds normal. No stridor. No respiratory distress. He has no wheezes.  Abdominal: Soft. He exhibits no distension. There is no tenderness. There is no guarding.  Musculoskeletal: He exhibits no edema.       Cervical back: He exhibits no tenderness, no bony tenderness, no swelling, no deformity, no laceration and no pain.       Thoracic back: He exhibits no tenderness, no bony tenderness, no deformity and no laceration.       Lumbar back: He exhibits no tenderness, no bony tenderness, no deformity and no laceration.  Moves all extremities; no midline tenderness  Neurological: He is alert and oriented to person, place, and time. He has normal strength. No cranial nerve deficit or sensory deficit.  Skin: He is not diaphoretic.    ED Course   Procedures (including critical care  time)  Labs Reviewed  CBC WITH DIFFERENTIAL - Abnormal; Notable for the following:    Hemoglobin 12.9 (*)    RDW 16.9 (*)    All other components within normal limits  POCT I-STAT TROPONIN I   Dg Thoracic Spine 2 View  01/31/2013  *RADIOLOGY REPORT*  Clinical Data: Larey Seat today with mid to lower back pain  THORACIC SPINE - 2 VIEW  Comparison: Chest x-ray of 01/07/2013  Findings: The thoracic vertebrae are in normal alignment with mild degenerative change in the lower thoracic spine.  No acute compression deformity is seen.  No paravertebral soft tissue swelling is noted. The bones are noted to be somewhat osteopenic. The nodular prominent right hilum again is noted on the frontal view.  IMPRESSION: Osteopenia and degenerative change.  No acute compression deformity.   Original Report Authenticated By: Dwyane Dee, M.D.    Dg Lumbar Spine Complete  01/31/2013  *RADIOLOGY REPORT*  Clinical Data: Larey Seat today with mid to low back pain  LUMBAR SPINE - COMPLETE 4+ VIEW  Comparison: None  Findings: The lumbar vertebrae are in normal alignment.  There is diffuse degenerative change particularly in the lower thoracic and upper lumbar spine.  There is degenerative disc disease at L1-2, L2- 3 and L3-4 levels.  No compression deformity is seen.  Degenerative change also is noted involving the facet joints throughout the lumbar spine. Bilateral total hip replacements are partially visualized.  IMPRESSION: Mild lumbar scoliosis with degenerative change.  No acute compression deformity.   Original Report Authenticated By: Dwyane Dee, M.D.    Ct Head Wo Contrast  01/30/2013  *RADIOLOGY REPORT*  Clinical Data:  Post fall with lacerations above both eye brows, now with severe headache, neck pain  CT HEAD WITHOUT CONTRAST CT CERVICAL SPINE WITHOUT CONTRAST  Technique:  Multidetector CT imaging of the head and cervical spine was performed following the standard protocol without  intravenous contrast.  Multiplanar CT image reconstructions of the cervical spine were also generated.  Comparison:  Head and C-spine CT - 09/06/2012; 08/20/2012  CT HEAD  Findings:  Grossly unchanged advanced atrophy with a 0 days active dilatation of the ventricular system.  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  Scattered periventricular hypodensities are grossly unchanged incompatible microvascular ischemic disease. Given background parenchymal abnormalities, there is no CT evidence of acute large territory infarct.  No intraparenchymal or extra- axial mass or hemorrhage.  No midline shift.  Unchanged size configuration of the ventricles and basilar cisterns.  Regional soft tissues are normal.  No displaced calvarial fracture. Incidental note is again made of underpneumatization the bilateral mastoid air cells.  Limited visualization of the paranasal sinuses is otherwise normal.  IMPRESSION:  1.  No acute intracranial process. 2.  Advanced atrophy and microvascular ischemic disease. 3.  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  CT CERVICAL SPINE  Findings:  The C1 to the superior endplate of T2 is imaged.  There is a mild scoliotic curvature of the cervical spine, convex to the right, possibly positional.  No anterolisthesis or retrolisthesis.  The bilateral facets are normally aligned.  The dens is normally positioned between the lateral masses of C1.  Normal atlantodental atlantoaxial articulation.  No fracture or static subluxation of the cervical spine.  A prominent nutrient foramen is again seen within through the superior articular surface of the right lateral facet of the C2 vertebral body.  Cervical vertebral body heights are preserved. Prevertebral soft tissues are normal.  Mild to moderate multilevel cervical spine DDD, worse at C5 - C6 and to a lesser extent, C6 - C7 and C4 - C5 with disc space height loss, end plate irregularity and primarily anteriorly directed disc  osteophyte complexes at these levels.  Limited visualization of the lung apices is normal.  Minimal vascular calcifications within the bilateral carotid bulbs and within the distal aspect of the right vertebral artery.  Regional soft tissues otherwise normal.  IMPRESSION: 1.  No fracture or static subluxation of the cervical spine. 2.  Mild to moderate multilevel cervical spine DDD, worse at C5 - C6.   Original Report Authenticated By: Tacey Ruiz, MD    Ct Cervical Spine Wo Contrast  01/30/2013  *RADIOLOGY REPORT*  Clinical Data:  Post fall with lacerations above both eye brows, now with severe headache, neck pain  CT HEAD WITHOUT CONTRAST CT CERVICAL SPINE WITHOUT CONTRAST  Technique:  Multidetector CT imaging of the head and cervical spine was performed following the standard protocol without intravenous contrast.  Multiplanar CT image reconstructions of the cervical spine were also generated.  Comparison:  Head and C-spine CT - 09/06/2012; 08/20/2012  CT HEAD  Findings:  Grossly unchanged advanced atrophy with a 0 days active dilatation of the ventricular system.  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  Scattered periventricular hypodensities are grossly unchanged incompatible microvascular ischemic disease. Given background parenchymal abnormalities, there is no CT evidence of acute large territory infarct.  No intraparenchymal or extra- axial mass or hemorrhage.  No midline shift.  Unchanged size configuration of the ventricles and basilar cisterns.  Regional soft tissues are normal.  No displaced calvarial fracture. Incidental note is again made of underpneumatization the bilateral mastoid air cells.  Limited visualization of the paranasal sinuses is otherwise normal.  IMPRESSION:  1.  No acute intracranial process. 2.  Advanced atrophy and microvascular ischemic disease. 3.  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  CT CERVICAL SPINE  Findings:  The C1 to the superior endplate of  T2 is imaged.  There is a mild scoliotic curvature of the cervical spine, convex to the right, possibly positional.  No anterolisthesis or retrolisthesis.  The bilateral facets are normally aligned.  The dens is normally positioned between the lateral masses of C1.  Normal atlantodental atlantoaxial articulation.  No fracture or static subluxation of the cervical spine.  A prominent nutrient foramen is again seen within through the superior articular surface of the right lateral facet of the C2 vertebral body.  Cervical vertebral body heights are preserved. Prevertebral soft tissues are normal.  Mild to moderate multilevel cervical spine DDD, worse at C5 - C6 and to a lesser extent, C6 - C7 and C4 - C5 with disc space height loss, end plate irregularity and primarily anteriorly directed disc osteophyte complexes at these levels.  Limited visualization of the lung apices is normal.  Minimal vascular calcifications within the bilateral carotid bulbs and within the distal aspect of the right vertebral artery.  Regional soft tissues otherwise normal.  IMPRESSION: 1.  No fracture or static subluxation of the cervical spine. 2.  Mild to moderate multilevel cervical spine DDD, worse at C5 - C6.   Original Report Authenticated By: Tacey Ruiz, MD    Dg Shoulder Left  01/30/2013  *RADIOLOGY REPORT*  Clinical Data: Fall.  Shoulder pain.  LEFT SHOULDER - 2+ VIEW  Comparison: 06/13/2012.  Findings: Remote left rib fractures.  Marked glenohumeral joint degenerative changes  with loose bodies. Moderate acromioclavicular joint degenerative changes.  No acute fracture or dislocation.  IMPRESSION: No acute fracture or dislocation.  Degenerative changes as noted above.   Original Report Authenticated By: Lacy Duverney, M.D.    Dg Hand Complete Right  01/30/2013  *RADIOLOGY REPORT*  Clinical Data: Fall.  Laceration along the metacarpophalangeal joints.  RIGHT HAND - COMPLETE 3+ VIEW  Comparison: None.  Findings: Peripheral  spurring with some chronic osteophyte fragmentation noted along the interphalangeal joints.  There is considerable spurring in the heads of the second and third metacarpals.  Several well corticated ossification centers or fragments are present between the ulna and the lunate/triquetrum.  Mild minus ulnar variance.  No findings of lunate malacia. Spurring at the first carpal metacarpal articulation noted.  No acute fracture or foreign body identified. No chondrocalcinosis in the TFCC.  IMPRESSION:  1. Chronic-appearing fragmented spurring, without fracture or acute bony findings.   Original Report Authenticated By: Gaylyn Rong, M.D.      1. Altered mental status   2. UTI (lower urinary tract infection)       MDM  77y/o male with hx of HTN and Parkinson's presents from nursing home after fall today. Patient states he has some pain around his mid back; nurse told by EMS patient complaining of pain between shoulder blades. Patient has no midline tenderness on exam and no distracting injury. Although patient cannot recall whether or not he lost consciousness, have discussed patient with Dr. Rubin Payor who has advised not to CT head as patient just had one for fall yesterday and there is no evidence or symptoms to suggest head trauma. Will obtain EKG to determine whether fall is secondary to cardiac etiology such as arrythmia. Will also obtain Xray of thoracic and lumbar spine to evaluate possible cause of pain. Patient alert, answers questions appropriately and is neurovascularly intact at this time.  Xray of thoracic and lumbar spine show no acute compression deformity, fracture, or subluxation. Patient ambulates with shuffling gait; do not feel comfortable with the patient ambulating without assistance as is not steady enough on his feet. Patient believed to stay at [facility]; will attempt to contact.  Elpidio Anis, PA-C spoke with Talbert Forest at [facility] who states the  patient ambulates with assistance at baseline and has been walking unassisted more often which is abnormal for him. Talbert Forest also states patient is usually very lucid and able to recall past events without difficulty; states his being unable to recall the two past falls or what caused them is unusual for him. Talbert Forest states the patient has been being tx for a UTI for months; given Bactrim upon d/c from ED last night. Based on this history, believe the patient requires admission for altered mental status causing multiple falls in the setting of persistent UTI. Intermittent arrhythmia as a cause of persistent falls has also not been ruled out, though EKG unremarkable for this today. Have discussed the patient with Elpidio Anis, PA-C and Dr. Rubin Payor. Will manage in ED until brought to the floor.   Date: 01/31/2013  Rate: 51  Rhythm: sinus bradycardia  QRS Axis: left  Intervals: normal  ST/T Wave abnormalities: normal  Conduction Disutrbances:none  Narrative Interpretation: sinus bradycardia; no STEMI  Old EKG Reviewed: unchanged  Filed Vitals:   01/31/13 1024 01/31/13 1138 01/31/13 1546  BP: 191/90 183/88 170/110  Pulse: 51 49 54  Temp: 98.5 F (36.9 C)  98 F (36.7 C)  TempSrc: Oral  Oral  Resp:  12 12  SpO2: 97% 97% 96%         Antony Madura, PA-C 01/31/13 1838

## 2013-01-31 NOTE — ED Notes (Signed)
Bed:[facility]<BR> Expected date:01/31/13<BR> Expected time:10:06 AM<BR> Means of arrival:Ambulance<BR> Comments:<BR> 77yoM fall

## 2013-01-31 NOTE — ED Provider Notes (Signed)
Discussed patient's history with [name], Talbert Forest, who reports patient usually does not try to walk without assistance and who has fallen x2 in 2 days. Seen here last night for same and was discharged back to [name] to return today after another fall. He complains only of back pain today. He reports he cannot remember the fall. Per Talbert Forest, he has been treated for UTI for some time (months), he is more confused and agitated over his baseline, usually with fairly detailed memory. She states it is not usual behavior that he attempt to walk without someone close by. Feel that he will require admission for altered mental status causing multiple falls in the setting of persistent UTI.   Arnoldo Hooker, PA-C 01/31/13 1311

## 2013-01-31 NOTE — ED Notes (Signed)
Report called to Kimberly, RN.

## 2013-01-31 NOTE — ED Notes (Signed)
Foley became disconnected from bag; Leg bag applied to existing foley tube.

## 2013-02-01 DIAGNOSIS — I3139 Other pericardial effusion (noninflammatory): Secondary | ICD-10-CM

## 2013-02-01 DIAGNOSIS — I313 Pericardial effusion (noninflammatory): Secondary | ICD-10-CM

## 2013-02-01 DIAGNOSIS — G9340 Encephalopathy, unspecified: Secondary | ICD-10-CM

## 2013-02-01 DIAGNOSIS — N179 Acute kidney failure, unspecified: Secondary | ICD-10-CM

## 2013-02-01 DIAGNOSIS — F411 Generalized anxiety disorder: Secondary | ICD-10-CM

## 2013-02-01 DIAGNOSIS — R4182 Altered mental status, unspecified: Secondary | ICD-10-CM

## 2013-02-01 DIAGNOSIS — I5032 Chronic diastolic (congestive) heart failure: Secondary | ICD-10-CM

## 2013-02-01 DIAGNOSIS — I495 Sick sinus syndrome: Secondary | ICD-10-CM

## 2013-02-01 LAB — URINE CULTURE: Colony Count: NO GROWTH

## 2013-02-01 LAB — COMPREHENSIVE METABOLIC PANEL
AST: 14 U/L (ref 0–37)
Albumin: 3.4 g/dL — ABNORMAL LOW (ref 3.5–5.2)
Calcium: 8.9 mg/dL (ref 8.4–10.5)
Chloride: 109 mEq/L (ref 96–112)
Creatinine, Ser: 1.14 mg/dL (ref 0.50–1.35)
Sodium: 142 mEq/L (ref 135–145)

## 2013-02-01 LAB — CBC
Hemoglobin: 11.9 g/dL — ABNORMAL LOW (ref 13.0–17.0)
MCH: 28.7 pg (ref 26.0–34.0)
MCV: 89.2 fL (ref 78.0–100.0)
RBC: 4.15 MIL/uL — ABNORMAL LOW (ref 4.22–5.81)
WBC: 7 10*3/uL (ref 4.0–10.5)

## 2013-02-01 NOTE — ED Provider Notes (Signed)
Medical screening examination/treatment/procedure(s) were performed by non-physician practitioner and as supervising physician I was immediately available for consultation/collaboration.  Jakaiya Netherland R. Llewellyn Schoenberger, MD 02/01/13 1540 

## 2013-02-01 NOTE — Progress Notes (Signed)
Echocardiogram 2D Echocardiogram has been performed.  [name] 02/01/2013, 10:19 AM

## 2013-02-01 NOTE — Evaluation (Signed)
Physical Therapy Evaluation Patient Details Name: [name]. MRN: [id number] DOB: 07/20/1935 Today's Date: 02/01/2013 Time: 4696-2952 PT Time Calculation (min): 28 min  PT Assessment / Plan / Recommendation Clinical Impression  Patient is a 78 y/o male admitted s/p fall at his ALF with head laceration and question UTI with indwelling Foley.  He presents wtih decreased safety with mobility with decreased balance, decreased safety awareness, impulsivity and poor activity toleance.  He will benefit from skilled PT in the acute setting and HHPT at [facility] reports available in house, but has had difficulty in past getting it set up upon discharge from hospital.)    PT Assessment  Patient needs continued PT services    Follow Up Recommendations  Home health PT;Supervision for mobility/OOB;Supervision - Intermittent          Equipment Recommendations  None recommended by PT       Frequency Min 3X/week    Precautions / Restrictions Precautions Precautions: Fall Precaution Comments: ex-wife (POA) reports patient will try getting up unaided and that's when he falls.   Pertinent Vitals/Pain Denies pain      Mobility  Bed Mobility Bed Mobility: Supine to Sit;Sitting - Scoot to Edge of Bed Supine to Sit: 5: Supervision;With rails;HOB elevated Sitting - Scoot to Edge of Bed: 5: Supervision Transfers Transfers: Sit to Stand;Stand to Sit Sit to Stand: 4: Min assist;From elevated surface;From bed;With upper extremity assist Stand to Sit: 4: Min assist;To chair/3-in-1;With upper extremity assist Details for Transfer Assistance: cues for hand placement for safety, assist for lifting and safe lowering Ambulation/Gait Ambulation/Gait Assistance: 3: Mod assist Ambulation Distance (Feet): 200 Feet Assistive device: Rolling walker Ambulation/Gait Assistance Details: cues to slow down, to stay inside walker, to avoid obstacles in hallway Gait Pattern: Step-through  pattern;Decreased dorsiflexion - right;Decreased dorsiflexion - left;Decreased stride length;Decreased trunk rotation;Trunk flexed        PT Diagnosis: Abnormality of gait;Generalized weakness  PT Problem List: Decreased strength;Decreased safety awareness;Decreased activity tolerance;Decreased balance;Decreased mobility PT Treatment Interventions: DME instruction;Gait training;Functional mobility training;Therapeutic activities;Therapeutic exercise;Wheelchair mobility training;Patient/family education;Balance training   PT Goals Acute Rehab PT Goals PT Goal Formulation: With patient/family Time For Goal Achievement: 02/08/13 Potential to Achieve Goals: Good Pt will go Sit to Stand: with supervision PT Goal: Sit to Stand - Progress: Goal set today Pt will go Stand to Sit: with supervision PT Goal: Stand to Sit - Progress: Goal set today Pt will Stand: with supervision;1 - 2 min;with unilateral upper extremity support PT Goal: Stand - Progress: Goal set today Pt will Ambulate: >150 feet;with min assist;with rolling walker PT Goal: Ambulate - Progress: Goal set today Pt will Propel Wheelchair: > 150 feet;with modified independence PT Goal: Propel Wheelchair - Progress: Goal set today  Visit Information  Last PT Received On: 02/01/13 Assistance Needed: +1    Subjective Data  Subjective: I think I am doing pretty good walking; what's your assessment? Patient Stated Goal: [facility]   Prior Functioning  Home Living Lives With: Alone Available Help at Discharge: Available PRN/intermittently Type of Home: Assisted living Home Layout: One level Additional Comments: in addition to ALF staff to help has aide each tay 11am-8pm Prior Function Level of Independence: Needs assistance Needs Assistance: Bathing;Dressing;Toileting;Gait;Transfers Gait Assistance: assist with walker; gets around in wheelchair okay Transfer Assistance: needs assist for safety   Communication Communication: No difficulties    Cognition  Cognition Overall Cognitive Status: Impaired Area of Impairment: Attention;Memory;Safety/judgement;Problem solving Arousal/Alertness: Awake/alert Orientation Level:  Place;Time;Disoriented to (stated at [facility]; was 2013 unsure if March or Feb) Behavior During Session: [facility] for tasks performed Current Attention Level: Sustained Attention - Other Comments: distractions in room cause increased time to get out his thoughts;  Memory: Decreased recall of precautions Memory Deficits: states forgets to call for help due to just natural to get up and go Safety/Judgement: Decreased safety judgement for tasks assessed;Decreased awareness of need for assistance Safety/Judgement - Other Comments: felt he did well even after educated on safety issues with proximity to walker, increased speed and needed assist around obstacles Problem Solving: States wasn't just him that caused him to fall    Extremity/Trunk Assessment Right Upper Extremity Assessment RUE ROM/Strength/Tone: Deficits RUE ROM/Strength/Tone Deficits: shoulder elevation limited to approx 90 degrees, strength grossly 4/5 Left Upper Extremity Assessment LUE ROM/Strength/Tone: Deficits LUE ROM/Strength/Tone Deficits: shoulder elevation limited to approx 80 degrees with crepitus palpated; strength grossly 4/5 Right Lower Extremity Assessment RLE ROM/Strength/Tone: Deficits RLE ROM/Strength/Tone Deficits: AROM grossly WFL (tight hamstrings;) strength hip flexion 4-/5 knee extension 4/5; ankle dorsiflextion 4/5 Left Lower Extremity Assessment LLE ROM/Strength/Tone: Deficits LLE ROM/Strength/Tone Deficits: AROM grossly WFL (tight hamstrings;) strength hip flexion 4-/5 knee extension 4/5; ankle dorsiflextion 4/5 Trunk Assessment Trunk Assessment: Kyphotic   Balance Balance Balance Assessed: Yes Static Sitting Balance Static Sitting - Balance Support: Feet supported;Bilateral upper  extremity supported Static Sitting - Level of Assistance: 6: Modified independent (Device/Increase time) Dynamic Sitting Balance Dynamic Sitting - Balance Support: Feet supported;No upper extremity supported Dynamic Sitting - Level of Assistance: 5: Stand by assistance Dynamic Sitting - Comments: reaching to sides and down to feet  End of Session PT - End of Session Equipment Utilized During Treatment: Gait belt Activity Tolerance: Patient tolerated treatment well Patient left: in chair;with chair alarm set;with family/visitor present;with nursing in room Nurse Communication: Mobility status  GP     [facility] 02/01/2013, 2:01 PM Elsie, PT [phone] 02/01/2013

## 2013-02-01 NOTE — Progress Notes (Addendum)
[facility] PROGRESS NOTE  [name]. ZOX:[id number] DOB: 03/13/1935 DOA: 01/31/2013 PCP: [name], MD  Assessment/Plan  acute encephalopathy:  No acute CVA on MRI.  B12, TSH, RPR WNL.  Continue to hold the Seroquel and trazodone.  urinary tract infection with a chronic indwelling Foley:  Continue ceftriaxone and Diflucan.  Follow-up culture results.   history of Parkinson's disease: Continue with his home medications for the same.   sinus bradycardia: Appears to be asymptomatic. Monitor him on telemetry. TSH WNL.    Diet:  Dysphagia 3 with thin liquids Access:  PIV IVF:  Off Proph:  Heparin   Code Status: Full Family Communication: Spoke with patient alone Disposition Plan: Pending completion of w/u.  PT recommending home health PT and supervision for mobility in and out of bed.  However, patient is clearly confused with no Tarus Briski-term memory and should likely have 24 hour supervision for safety purposes.  Consultants:  None  Procedures:  MRI brain  Antibiotics:  Ceftriaxone  Fluconazole   HPI/Subjective: Patient states that he feels fine.  Denies CP, SOB, N/V/D and abdominal pain.  Objective: Filed Vitals:   01/31/13 1706 01/31/13 2238 02/01/13 0540 02/01/13 1439  BP: 194/121 155/75 145/70 143/66  Pulse: 63 52 49 52  Temp:  97.5 F (36.4 C) 97.5 F (36.4 C) 97.6 F (36.4 C)  TempSrc:  Oral Oral Oral  Resp:  18 18 14   Height:      Weight:      SpO2: 100% 100% 100% 95%    Intake/Output Summary (Last 24 hours) at 02/01/13 2017 Last data filed at 02/01/13 1900  Gross per 24 hour  Intake    530 ml  Output   1150 ml  Net   -620 ml   Filed Weights   01/31/13 1703  Weight: 65 kg (143 lb 4.8 oz)    Exam:   General:  Caucasian male lying in bed.  NAD  HEENT:  MMM  Cardiovascular:  RRR, no m/r/g.  2+ pulses; warm extremities  Respiratory:  Rales that clear at bilateral bases.  No rhonchi, wheeze, or increased WOB  Abdomen:   NABS, soft, NT/ND  MSK:  Normal tone and bulk  Neuro:  Significantly impaired Cherrill Scrima-term memory.  No focal neuro deficits.  Data Reviewed: Basic Metabolic Panel:  Recent Labs Lab 01/30/13 1940 01/31/13 1745 02/01/13 0515  NA 140 142 142  K 4.3 4.3 4.1  CL 108 106 109  CO2  --  25 25  GLUCOSE 101* 88 97  BUN 22 22 23   CREATININE 1.30 1.30 1.14  CALCIUM  --  9.3 8.9   Liver Function Tests:  Recent Labs Lab 01/31/13 1745 02/01/13 0515  AST 16 14  ALT 12 <5  ALKPHOS 102 88  BILITOT 0.3 0.2*  PROT 6.2 5.3*  ALBUMIN 3.8 3.4*   No results found for this basename: LIPASE, AMYLASE,  in the last 168 hours No results found for this basename: AMMONIA,  in the last 168 hours CBC:  Recent Labs Lab 01/30/13 1940 01/31/13 1325 02/01/13 0515  WBC  --  8.1 7.0  NEUTROABS  --  6.0  --   HGB 11.2* 12.9* 11.9*  HCT 33.0* 39.6 37.0*  MCV  --  88.2 89.2  PLT  --  279 280   Cardiac Enzymes: No results found for this basename: CKTOTAL, CKMB, CKMBINDEX, TROPONINI,  in the last 168 hours BNP (last 3 results) No results found for this basename: PROBNP,  in  the last 8760 hours CBG: No results found for this basename: GLUCAP,  in the last 168 hours  Recent Results (from the past 240 hour(s))  URINE CULTURE     Status: None   Collection Time    01/30/13 11:07 PM      Result Value Range Status   Specimen Description URINE, CATHETERIZED   Final   Special Requests NONE   Final   Culture  Setup Time 01/31/2013 05:53   Final   Colony Count NO GROWTH   Final   Culture NO GROWTH   Final   Report Status 02/01/2013 FINAL   Final  MRSA PCR SCREENING     Status: None   Collection Time    01/31/13 10:48 PM      Result Value Range Status   MRSA by PCR NEGATIVE  NEGATIVE Final   Comment:            The GeneXpert MRSA Assay (FDA     approved for NASAL specimens     only), is one component of a     comprehensive MRSA colonization     surveillance program. It is not     intended to  diagnose MRSA     infection nor to guide or     monitor treatment for     MRSA infections.     Studies: Dg Thoracic Spine 2 View  01/31/2013  *RADIOLOGY REPORT*  Clinical Data: Larey Seat today with mid to lower back pain  THORACIC SPINE - 2 VIEW  Comparison: Chest x-ray of 01/07/2013  Findings: The thoracic vertebrae are in normal alignment with mild degenerative change in the lower thoracic spine.  No acute compression deformity is seen.  No paravertebral soft tissue swelling is noted. The bones are noted to be somewhat osteopenic. The nodular prominent right hilum again is noted on the frontal view.  IMPRESSION: Osteopenia and degenerative change.  No acute compression deformity.   Original Report Authenticated By: Dwyane Dee, M.D.    Dg Lumbar Spine Complete  01/31/2013  *RADIOLOGY REPORT*  Clinical Data: Larey Seat today with mid to low back pain  LUMBAR SPINE - COMPLETE 4+ VIEW  Comparison: None  Findings: The lumbar vertebrae are in normal alignment.  There is diffuse degenerative change particularly in the lower thoracic and upper lumbar spine.  There is degenerative disc disease at L1-2, L2- 3 and L3-4 levels.  No compression deformity is seen.  Degenerative change also is noted involving the facet joints throughout the lumbar spine. Bilateral total hip replacements are partially visualized.  IMPRESSION: Mild lumbar scoliosis with degenerative change.  No acute compression deformity.   Original Report Authenticated By: Dwyane Dee, M.D.    Mr Brain Wo Contrast  02/01/2013  *RADIOLOGY REPORT*  Clinical Data: Acute encephalopathy.  History of Parkinson's disease, diabetes, and hypertension. Multiple recent falls with increasing confusion.  MRI HEAD WITHOUT CONTRAST  Technique:  Multiplanar, multiecho pulse sequences of the brain and surrounding structures were obtained according to standard protocol without intravenous contrast.  Comparison: Most recent CT 01/30/2013.  Most recent MR 08/16/2012.  Findings:  No acute stroke or bleed.  No mass lesion, hydrocephalus, or subdural hemorrhage.  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  There is slight elevation of torcula and slight flattening of the vermis.  These findings are not acute.  Advanced atrophy is present.  Extensive chronic microvascular ischemic changes noted.  There  are no foci of chronic hemorrhage. The carotid and basilar arteries widely patent.  There is no visible acute osseous abnormality. There is no acute sinus fluid. The orbits are negative.  Compared with priors, a similar appearance is noted.  IMPRESSION: Atrophy and chronic microvascular ischemic change.  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  No acute stroke, intracranial hemorrhage, subdural hematoma, or other significant acute/reversible finding.   Original Report Authenticated By: Davonna Belling, M.D.     Scheduled Meds: . aspirin  81 mg Oral Daily  . carbidopa-levodopa  1 tablet Oral TID  . cefTRIAXone (ROCEPHIN)  IV  1 g Intravenous Q24H  . entacapone  200 mg Oral TID  . fluconazole (DIFLUCAN) IV  100 mg Intravenous Q24H  . heparin  5,000 Units Subcutaneous Q8H  . lamoTRIgine  150 mg Oral BID  . pantoprazole  40 mg Oral Daily  . polyethylene glycol  17 g Oral Daily  . sertraline  150 mg Oral q morning - 10a  . sodium chloride  3 mL Intravenous Q12H   Continuous Infusions:   Principal Problem:   Acute encephalopathy Active Problems:   UTI (lower urinary tract infection)   Parkinson disease   Bradycardia   Chronic diastolic congestive heart failure   Pericardial effusion    Time spent: 30 min    Shanan Mcmiller  [facility] Pager [phone]. If 7PM-7AM, please contact night-coverage at [email or web address], password [facility] 02/01/2013, 8:17 PM  LOS: 1 day

## 2013-02-01 NOTE — ED Provider Notes (Signed)
Medical screening examination/treatment/procedure(s) were performed by non-physician practitioner and as supervising physician I was immediately available for consultation/collaboration.  Adhrit Krenz R. Kyesha Balla, MD 02/01/13 1540 

## 2013-02-01 NOTE — Progress Notes (Signed)
UR completed 

## 2013-02-02 DIAGNOSIS — F319 Bipolar disorder, unspecified: Secondary | ICD-10-CM

## 2013-02-02 MED ORDER — TRAZODONE HCL 50 MG PO TABS
50.0000 mg | ORAL_TABLET | Freq: Every evening | ORAL | Status: DC | PRN
  Filled 2013-02-02: qty 1

## 2013-02-02 MED ORDER — HYDRALAZINE HCL 10 MG PO TABS
10.0000 mg | ORAL_TABLET | Freq: Three times a day (TID) | ORAL | Status: DC
  Administered 2013-02-02 – 2013-02-03 (×3): 10 mg via ORAL
  Filled 2013-02-02 (×6): qty 1

## 2013-02-02 MED ORDER — FLUCONAZOLE 100 MG PO TABS
100.0000 mg | ORAL_TABLET | ORAL | Status: DC
  Filled 2013-02-02: qty 1

## 2013-02-02 MED ORDER — LISINOPRIL 5 MG PO TABS
5.0000 mg | ORAL_TABLET | Freq: Every day | ORAL | Status: DC
  Administered 2013-02-02: 5 mg via ORAL
  Filled 2013-02-02 (×2): qty 1

## 2013-02-02 MED ORDER — HYDRALAZINE HCL 20 MG/ML IJ SOLN: 10.0000 mg | INTRAMUSCULAR | Status: DC | PRN

## 2013-02-02 NOTE — Progress Notes (Signed)
Clinical Social Work Department BRIEF PSYCHOSOCIAL ASSESSMENT 02/02/2013  Patient:  [name], [name]     Account Number:  [id number]     Admit date:  01/31/2013  Clinical Social Worker:  Leron Croak, CLINICAL SOCIAL WORKER  Date/Time:  02/02/2013 06:16 PM  Referred by:  Physician  Date Referred:  02/02/2013 Referred for  SNF Placement   Other Referral:   Interview type:  Family Other interview type:    PSYCHOSOCIAL DATA Living Status:  FACILITY Admitted from facility:  [name] Level of care:   Primary support name:  [name] Primary support relationship to patient:  FAMILY Degree of support available:   Pt has some support from family and [name] but may need more services    CURRENT CONCERNS Current Concerns  Post-Acute Placement   Other Concerns:    SOCIAL WORK ASSESSMENT / PLAN CSW was contacted by Dr. Riley Kill concerning the Pt and that she felt he would need a higher level of care that his previous placement. CSW was given contact number for Rockie Neighbours and asked to speak with her and adress her concerns about moving pt to a SNF.  CSW contact Okey Regal and informed CSW role in assisting with SNF placement. Family was concerned about moving into a SNF placement "too fast and make a mistake putting him somewhere he does not like." CSW voiced understanding and was able to obtain family members email and email the SNF listing so that the family can begin to look at facilities.  Rockie Neighbours did call CSW back confirming that she had received the listing and gave permission for CSW to send out information for search and possible SNF placement.   Assessment/plan status:  Information/Referral to [facility] Other assessment/ plan:   Information/referral to community resources:   CSW emailed a listing of SNF in the surrounding areas    PATIENT'S/FAMILY'S RESPONSE TO PLAN OF CARE: Family was appreciative for assistance and will speak  with weekday CSW for assistance with d/c planning.        Leron Croak, LCSWA [employer] [phone]

## 2013-02-02 NOTE — Progress Notes (Signed)
[facility] PROGRESS NOTE  [name]. ZOX:[id number] DOB: 1934/12/23 DOA: 01/31/2013 PCP: [name], MD  Assessment/Plan  acute encephalopathy, resolving.  No acute CVA on MRI.  B12, TSH, RPR WNL.  Restart trazodone.  urinary tract infection with a chronic indwelling Foley:  D/c ceftriaxone and Diflucan.  culture neg.   history of Parkinson's disease: stable. Continue with his home medications.   sinus bradycardia: Appears to be asymptomatic. Monitor him on telemetry. TSH WNL.   Hypertension: Start hydralazine and lisinopril  Diet:  Dysphagia 3 with thin liquids Access:  PIV IVF:  Off Proph:  Heparin   Code Status: Full Family Communication: Spoke with patient alone Disposition Plan: Spoke at length with family regarding SNF as patient is clearly confused with no Shatima Zalar-term memory and should likely have 24 hour supervision for safety purposes.  The family is amenable to SNF placement and met with the SW today.  Awaiting placement and correcting elevated BP.  Consultants:  None  Procedures:  MRI brain  Antibiotics:  Ceftriaxone 2/14->2/16  Fluconazole 2/14->2/16  HPI/Subjective: Patient states that he feels fine.  Denies CP, SOB, N/V/D and abdominal pain.  Feels the same as day prior.  Objective: Filed Vitals:   02/01/13 1439 02/01/13 2108 02/02/13 0500 02/02/13 1509  BP: 143/66 161/69 186/73 177/71  Pulse: 52 50 50 50  Temp: 97.6 F (36.4 C) 97.6 F (36.4 C) 97.6 F (36.4 C) 97.3 F (36.3 C)  TempSrc: Oral Oral Oral Oral  Resp: 14 14 16 18   Height:      Weight:      SpO2: 95% 100% 95% 100%    Intake/Output Summary (Last 24 hours) at 02/02/13 1958 Last data filed at 02/02/13 1837  Gross per 24 hour  Intake 2508.33 ml  Output   2800 ml  Net -291.67 ml   Filed Weights   01/31/13 1703  Weight: 65 kg (143 lb 4.8 oz)    Exam:   General:  Caucasian male lying in bed.  NAD  HEENT:  MMM  Cardiovascular:  RRR, no m/r/g.  2+  pulses; warm extremities  Respiratory:  No rales, rhonchi, wheeze, or increased WOB  Abdomen:  NABS, soft, NT/ND  MSK:  Normal tone and bulk  Neuro:  Significantly impaired Lateia Fraser-term memory.  No focal neuro deficits.  Data Reviewed: Basic Metabolic Panel:  Recent Labs Lab 01/30/13 1940 01/31/13 1745 02/01/13 0515  NA 140 142 142  K 4.3 4.3 4.1  CL 108 106 109  CO2  --  25 25  GLUCOSE 101* 88 97  BUN 22 22 23   CREATININE 1.30 1.30 1.14  CALCIUM  --  9.3 8.9   Liver Function Tests:  Recent Labs Lab 01/31/13 1745 02/01/13 0515  AST 16 14  ALT 12 <5  ALKPHOS 102 88  BILITOT 0.3 0.2*  PROT 6.2 5.3*  ALBUMIN 3.8 3.4*   No results found for this basename: LIPASE, AMYLASE,  in the last 168 hours No results found for this basename: AMMONIA,  in the last 168 hours CBC:  Recent Labs Lab 01/30/13 1940 01/31/13 1325 02/01/13 0515  WBC  --  8.1 7.0  NEUTROABS  --  6.0  --   HGB 11.2* 12.9* 11.9*  HCT 33.0* 39.6 37.0*  MCV  --  88.2 89.2  PLT  --  279 280   Cardiac Enzymes: No results found for this basename: CKTOTAL, CKMB, CKMBINDEX, TROPONINI,  in the last 168 hours BNP (last 3 results) No results found  for this basename: PROBNP,  in the last 8760 hours CBG: No results found for this basename: GLUCAP,  in the last 168 hours  Recent Results (from the past 240 hour(s))  URINE CULTURE     Status: None   Collection Time    01/30/13 11:07 PM      Result Value Range Status   Specimen Description URINE, CATHETERIZED   Final   Special Requests NONE   Final   Culture  Setup Time 01/31/2013 05:53   Final   Colony Count NO GROWTH   Final   Culture NO GROWTH   Final   Report Status 02/01/2013 FINAL   Final  MRSA PCR SCREENING     Status: None   Collection Time    01/31/13 10:48 PM      Result Value Range Status   MRSA by PCR NEGATIVE  NEGATIVE Final   Comment:            The GeneXpert MRSA Assay (FDA     approved for NASAL specimens     only), is one  component of a     comprehensive MRSA colonization     surveillance program. It is not     intended to diagnose MRSA     infection nor to guide or     monitor treatment for     MRSA infections.     Studies: Mr Brain Wo Contrast  02/01/2013  *RADIOLOGY REPORT*  Clinical Data: Acute encephalopathy.  History of Parkinson's disease, diabetes, and hypertension. Multiple recent falls with increasing confusion.  MRI HEAD WITHOUT CONTRAST  Technique:  Multiplanar, multiecho pulse sequences of the brain and surrounding structures were obtained according to standard protocol without intravenous contrast.  Comparison: Most recent CT 01/30/2013.  Most recent MR 08/16/2012.  Findings: No acute stroke or bleed.  No mass lesion, hydrocephalus, or subdural hemorrhage.  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  There is slight elevation of torcula and slight flattening of the vermis.  These findings are not acute.  Advanced atrophy is present.  Extensive chronic microvascular ischemic changes noted.  There are no foci of chronic hemorrhage. The carotid and basilar arteries widely patent.  There is no visible acute osseous abnormality. There is no acute sinus fluid. The orbits are negative.  Compared with priors, a similar appearance is noted.  IMPRESSION: Atrophy and chronic microvascular ischemic change.  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  No acute stroke, intracranial hemorrhage, subdural hematoma, or other significant acute/reversible finding.   Original Report Authenticated By: Davonna Belling, M.D.     Scheduled Meds: . aspirin  81 mg Oral Daily  . carbidopa-levodopa  1 tablet Oral TID  . entacapone  200 mg Oral TID  . heparin  5,000 Units Subcutaneous Q8H  . hydrALAZINE  10 mg Oral Q8H  . lamoTRIgine  150 mg Oral BID  . pantoprazole  40 mg Oral Daily  . polyethylene glycol  17 g  Oral Daily  . sertraline  150 mg Oral q morning - 10a  . sodium chloride  3 mL Intravenous Q12H   Continuous Infusions:   Principal Problem:   Acute encephalopathy Active Problems:   UTI (lower urinary tract infection)   Parkinson disease   Bradycardia   Chronic diastolic congestive heart failure   Pericardial effusion    Time spent: 30 min    Damian Buckles  [facility] Pager [phone]. If 7PM-7AM, please contact night-coverage at [email or web address], password [facility] 02/02/2013, 7:58 PM  LOS: 2 days

## 2013-02-02 NOTE — Progress Notes (Signed)
PHARMACIST - PHYSICIAN COMMUNICATION CONCERNING: Antibiotic IV to Oral Route Change Policy  RECOMMENDATION: This patient is receiving Fluconazole by the intravenous route.  Based on criteria approved by the Pharmacy and Therapeutics Committee, the antibiotic(s) is/are being converted to the equivalent oral dose form(s).   DESCRIPTION: These criteria include:  Patient being treated for a respiratory tract infection, urinary tract infection, or cellulitis  The patient is not neutropenic and does not exhibit a GI malabsorption state  The patient is eating (either orally or via tube) and/or has been taking other orally administered medications for a least 24 hours  The patient is improving clinically and has a Tmax < 100.5  If you have questions about this conversion, please contact the Pharmacy Department  []   [phone] )  [facility] []   [phone] )  [facility]  []   [phone] )  [facility] [x]   [phone] )  [place] [facility]   Geoffry Paradise, PharmD, BCPS Pager: [phone] 2:29 PM Pharmacy #: [id number]

## 2013-02-02 NOTE — Discharge Summary (Addendum)
Physician Discharge Summary  [name]. ZOX:[id number] DOB: [birth year] DOA: 01/31/2013  PCP: [name], MD  Admit date: 01/31/2013 Discharge date: 02/03/2013  Recommendations for Outpatient Follow-up:  1. Follow up with primary care doctor within 1-2 weeks of discharge for repeat blood pressure, wound check, heart rate check and blood work.  Please check BMP since we started ACEI during this admission.    2. Home health PT   Discharge Diagnoses:  Principal Problem:   Acute encephalopathy Active Problems:   UTI (lower urinary tract infection)   Parkinson disease   Bradycardia   Chronic diastolic congestive heart failure   Pericardial effusion   Discharge Condition: stable, improved  Diet recommendation: low sodium, dysphagia 3 with thin liquids  Wt Readings from Last 3 Encounters:  01/31/13 65 kg (143 lb 4.8 oz)  01/07/13 68.04 kg (150 lb)  12/17/12 69.7 kg (153 lb 10.6 oz)    History of present illness:   [name]. is a 78 y.o. male who lives in an assisted living facility. He has a history of Parkinson's disease, frequent UTI, indwelling Foley catheter and possibly some behavioral issues. He has had multiple visits to the ED for problems with his Foley catheter. His been diagnosed with UTI multiple times. He presented yesterday to the emergency department after a fall. He had a small laceration on the scalp. CT head was unremarkable. Patient was sent back to the assisted living facility. However, patient subsequently, had another fall. Apparently, he was very confused at the assisted living facility. So, they sent him back to the ED. At baseline patient doesn't really walk without assistance. However, in the last 24 hours he has tried to do the same. Patient is very confused currently. He is unable to answer any of my questions. So history is very limited. Denies any pain, however. Is able to follow certain commands.   Hospital Course:   Acute encephalopathy,  resolving and may have been due to side effect of sedating medications in the setting of dementia.  Head CT demonstrated no acute intracranial process, advanced atrophy and microvascular ischemic changes.  Neck CT demonstrated no fracture of subluxation and mild to moderate DDD.  He had no acute CVA on MRI. B12, TSH, RPR were WNL.  After discussion with the family, who stated that the patient develops confusion and insomnia without his night time medications, his trazodone was restrated.  His seroquel was held.  Initially, his confusion was attributed to urinary tract infection and his chronic indwelling foley was exchanged and he was started on ceftriaxone and diflucan, however, his urine culture was negative.  Although he was seen by physical therapy and he has the strength to return to assisted living, his lack of Bryn Perkin term memory requires 24 hour supervision.  He has had several falls recently, always after the daytime caregiver has left.  After conversation with the family, they agreed that he would be safer in a skilled nursing facility, however, the family would like more time to consider and review the options.  He will be discharged to his assisted living facility with 24 hours caregiver presence until the family can find an acceptable SNF.    History of Parkinson's disease: stable. Continue with his home medications.   Sinus bradycardia, asymptomatic.  TSH WNL.  Avoided beta blockers.   Hypertension:  Started hydralazine and lisinopril and norvasc.   Normocytic anemia, stable.  Likely due to anemia of chronic disease.  Defer further work up to PCP  if not already complete.    Consultants:  None Procedures:  MRI brain CT head and neck Antibiotics:  Ceftriaxone 2/14->2/16  Fluconazole 2/14->2/16   Discharge Exam: Filed Vitals:   02/03/13 0554  BP:   Pulse: 47  Temp: 97.9 F (36.6 C)  Resp: 16   Filed Vitals:   02/02/13 2109 02/02/13 2112 02/03/13 0550 02/03/13 0554  BP: 185/80  185/80 178/70   Pulse:  51  47  Temp:  97.8 F (36.6 C)  97.9 F (36.6 C)  TempSrc:  Oral  Oral  Resp:  20  16  Height:      Weight:      SpO2:  100%  97%    General: Caucasian male sitting up in bed.  HEENT:  Healing lacerations on brow. C/d/i. MMM  Cardiovascular: RRR, no m/r/g. 2+ pulses; warm extremities  Respiratory: No rales, rhonchi, wheeze, or increased WOB  Abdomen: NABS, soft, NT/ND  MSK: Normal tone and bulk, 2+ pulses.  Cracked skin on heels.   Neuro: Significantly impaired Shandee Jergens-term memory. No focal neuro deficits.     Discharge Instructions      Discharge Orders   Future Orders Complete By Expires     Call MD for:  difficulty breathing, headache or visual disturbances  As directed     Call MD for:  extreme fatigue  As directed     Call MD for:  hives  As directed     Call MD for:  persistant dizziness or light-headedness  As directed     Call MD for:  persistant nausea and vomiting  As directed     Call MD for:  severe uncontrolled pain  As directed     Call MD for:  temperature >100.4  As directed     Diet general  As directed     Comments:      Low sodium, dysphagia 3 with thin liquids.    Discharge instructions  As directed     Comments:      Mr. Frater was admitted with confusion and a fall, both of which may be due to a combination of sedating medications and to progressive dementia.  His seroquel was discontinued.  His mentation improved.  He was tested for reversible causes of dementia, however, these tests were normal.  He was treated for urinary tract infection, however, his urine culture was negative so antibiotics were stopped.  His urine catheter was exchanged during this admission.  His blood pressure were elevated and he was started on some blood pressure medications.  He should have blood work, repeat blood pressure check, and wound check in 1 week by his primary care doctor.    Increase activity slowly  As directed         Medication List     STOP taking these medications       QUEtiapine 50 MG tablet  Commonly known as:  SEROQUEL      TAKE these medications       acetaminophen 325 MG tablet  Commonly known as:  TYLENOL  Take 2 tablets (650 mg total) by mouth every 6 (six) hours as needed for pain or fever.     amLODipine 5 MG tablet  Commonly known as:  NORVASC  Take 1 tablet (5 mg total) by mouth daily.     aspirin 81 MG chewable tablet  Chew 81 mg by mouth daily.     AZO-CRANBERRY 450 MG Tabs  Generic drug:  Cranberry  Take  1 tablet by mouth every morning.     carbidopa-levodopa 25-250 MG per tablet  Commonly known as:  SINEMET IR  Take 1 tablet by mouth 3 (three) times daily.     clonazePAM 0.5 MG tablet  Commonly known as:  KLONOPIN  Take 0.5 mg by mouth 2 (two) times daily as needed. For anxiety     entacapone 200 MG tablet  Commonly known as:  COMTAN  Take 200 mg by mouth 3 (three) times daily.     guaiFENesin 600 MG 12 hr tablet  Commonly known as:  MUCINEX  Take 1,200 mg by mouth 2 (two) times daily as needed for congestion.     hydrALAZINE 25 MG tablet  Commonly known as:  APRESOLINE  Take 1 tablet (25 mg total) by mouth every 8 (eight) hours.     lamoTRIgine 150 MG tablet  Commonly known as:  LAMICTAL  Take 150 mg by mouth 2 (two) times daily.     lisinopril 10 MG tablet  Commonly known as:  PRINIVIL,ZESTRIL  Take 1 tablet (10 mg total) by mouth daily.     mineral oil-hydrophilic petrolatum ointment  Apply topically 2 (two) times daily as needed for dry skin (on heels).     multivitamin with minerals Tabs  Take 1 tablet by mouth every morning.     naproxen 500 MG tablet  Commonly known as:  NAPROSYN  Take 1 tablet (500 mg total) by mouth 2 (two) times daily with a meal.     naproxen 375 MG tablet  Commonly known as:  NAPROSYN  Take 375 mg by mouth daily with breakfast.     omeprazole 20 MG capsule  Commonly known as:  PRILOSEC  Take 20 mg by mouth every morning.      polyethylene glycol packet  Commonly known as:  MIRALAX / GLYCOLAX  Take 17 g by mouth daily.     RED YEAST RICE PO  Take 2 tablets by mouth at bedtime.     sertraline 50 MG tablet  Commonly known as:  ZOLOFT  Take 150 mg by mouth every morning.     traZODone 50 MG tablet  Commonly known as:  DESYREL  Take 50 mg by mouth at bedtime as needed. sleep     Valerian Root 500 MG Caps  Take 1 capsule by mouth at bedtime.       Follow-up Information   Follow up with [name], MD. Schedule an appointment as soon as possible for a visit in 1 week.   Contact information:   [address] [place] [address] [phone]        The results of significant diagnostics from this hospitalization (including imaging, microbiology, ancillary and laboratory) are listed below for reference.    Significant Diagnostic Studies: Dg Chest 2 View  01/07/2013  *RADIOLOGY REPORT*  Clinical Data: Follow up of empyema, post thoracotomy, some shortness of breath  CHEST - 2 VIEW  Comparison: Chest x-ray of 12/23/2012  Findings: Aeration of the lungs has improved with decrease in basilar atelectasis.  An opacity remains extending anteriorly within the right upper lobe postoperatively and a tiny right effusion is present.  Cardiomegaly is stable.  Multiple old left rib fractures are noted.  There are degenerative changes in the shoulders.  IMPRESSION: Improved aeration with persistent opacity extending anteriorly in the right upper lobe with small right pleural effusion.   Original Report Authenticated By: Dwyane Dee, M.D.    Dg Thoracic Spine 2 View  01/31/2013  *RADIOLOGY REPORT*  Clinical Data: Larey Seat today with mid to lower back pain  THORACIC SPINE - 2 VIEW  Comparison: Chest x-ray of 01/07/2013  Findings: The thoracic vertebrae are in normal alignment with mild degenerative change in the lower thoracic spine.  No acute compression deformity is seen.  No paravertebral soft tissue swelling is noted. The  bones are noted to be somewhat osteopenic. The nodular prominent right hilum again is noted on the frontal view.  IMPRESSION: Osteopenia and degenerative change.  No acute compression deformity.   Original Report Authenticated By: Dwyane Dee, M.D.    Dg Lumbar Spine Complete  01/31/2013  *RADIOLOGY REPORT*  Clinical Data: Larey Seat today with mid to low back pain  LUMBAR SPINE - COMPLETE 4+ VIEW  Comparison: None  Findings: The lumbar vertebrae are in normal alignment.  There is diffuse degenerative change particularly in the lower thoracic and upper lumbar spine.  There is degenerative disc disease at L1-2, L2- 3 and L3-4 levels.  No compression deformity is seen.  Degenerative change also is noted involving the facet joints throughout the lumbar spine. Bilateral total hip replacements are partially visualized.  IMPRESSION: Mild lumbar scoliosis with degenerative change.  No acute compression deformity.   Original Report Authenticated By: Dwyane Dee, M.D.    Ct Head Wo Contrast  01/30/2013  *RADIOLOGY REPORT*  Clinical Data:  Post fall with lacerations above both eye brows, now with severe headache, neck pain  CT HEAD WITHOUT CONTRAST CT CERVICAL SPINE WITHOUT CONTRAST  Technique:  Multidetector CT imaging of the head and cervical spine was performed following the standard protocol without intravenous contrast.  Multiplanar CT image reconstructions of the cervical spine were also generated.  Comparison:  Head and C-spine CT - 09/06/2012; 08/20/2012  CT HEAD  Findings:  Grossly unchanged advanced atrophy with a 0 days active dilatation of the ventricular system.  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  Scattered periventricular hypodensities are grossly unchanged incompatible microvascular ischemic disease. Given background parenchymal abnormalities, there is no CT evidence of acute large territory infarct.  No intraparenchymal or extra- axial mass or hemorrhage.  No midline shift.  Unchanged size  configuration of the ventricles and basilar cisterns.  Regional soft tissues are normal.  No displaced calvarial fracture. Incidental note is again made of underpneumatization the bilateral mastoid air cells.  Limited visualization of the paranasal sinuses is otherwise normal.  IMPRESSION:  1.  No acute intracranial process. 2.  Advanced atrophy and microvascular ischemic disease. 3.  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  CT CERVICAL SPINE  Findings:  The C1 to the superior endplate of T2 is imaged.  There is a mild scoliotic curvature of the cervical spine, convex to the right, possibly positional.  No anterolisthesis or retrolisthesis.  The bilateral facets are normally aligned.  The dens is normally positioned between the lateral masses of C1.  Normal atlantodental atlantoaxial articulation.  No fracture or static subluxation of the cervical spine.  A prominent nutrient foramen is again seen within through the superior articular surface of the right lateral facet of the C2 vertebral body.  Cervical vertebral body heights are preserved. Prevertebral soft tissues are normal.  Mild to moderate multilevel cervical spine DDD, worse at C5 - C6 and to a lesser extent, C6 - C7 and C4 - C5 with disc space height loss, end plate irregularity and primarily anteriorly directed disc osteophyte complexes at these levels.  Limited visualization of the  lung apices is normal.  Minimal vascular calcifications within the bilateral carotid bulbs and within the distal aspect of the right vertebral artery.  Regional soft tissues otherwise normal.  IMPRESSION: 1.  No fracture or static subluxation of the cervical spine. 2.  Mild to moderate multilevel cervical spine DDD, worse at C5 - C6.   Original Report Authenticated By: Tacey Ruiz, MD    Ct Cervical Spine Wo Contrast  01/30/2013  *RADIOLOGY REPORT*  Clinical Data:  Post fall with lacerations above both eye brows, now with severe headache, neck pain  CT HEAD  WITHOUT CONTRAST CT CERVICAL SPINE WITHOUT CONTRAST  Technique:  Multidetector CT imaging of the head and cervical spine was performed following the standard protocol without intravenous contrast.  Multiplanar CT image reconstructions of the cervical spine were also generated.  Comparison:  Head and C-spine CT - 09/06/2012; 08/20/2012  CT HEAD  Findings:  Grossly unchanged advanced atrophy with a 0 days active dilatation of the ventricular system.  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  Scattered periventricular hypodensities are grossly unchanged incompatible microvascular ischemic disease. Given background parenchymal abnormalities, there is no CT evidence of acute large territory infarct.  No intraparenchymal or extra- axial mass or hemorrhage.  No midline shift.  Unchanged size configuration of the ventricles and basilar cisterns.  Regional soft tissues are normal.  No displaced calvarial fracture. Incidental note is again made of underpneumatization the bilateral mastoid air cells.  Limited visualization of the paranasal sinuses is otherwise normal.  IMPRESSION:  1.  No acute intracranial process. 2.  Advanced atrophy and microvascular ischemic disease. 3.  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  CT CERVICAL SPINE  Findings:  The C1 to the superior endplate of T2 is imaged.  There is a mild scoliotic curvature of the cervical spine, convex to the right, possibly positional.  No anterolisthesis or retrolisthesis.  The bilateral facets are normally aligned.  The dens is normally positioned between the lateral masses of C1.  Normal atlantodental atlantoaxial articulation.  No fracture or static subluxation of the cervical spine.  A prominent nutrient foramen is again seen within through the superior articular surface of the right lateral facet of the C2 vertebral body.  Cervical vertebral body heights are preserved. Prevertebral soft tissues are normal.  Mild to moderate multilevel  cervical spine DDD, worse at C5 - C6 and to a lesser extent, C6 - C7 and C4 - C5 with disc space height loss, end plate irregularity and primarily anteriorly directed disc osteophyte complexes at these levels.  Limited visualization of the lung apices is normal.  Minimal vascular calcifications within the bilateral carotid bulbs and within the distal aspect of the right vertebral artery.  Regional soft tissues otherwise normal.  IMPRESSION: 1.  No fracture or static subluxation of the cervical spine. 2.  Mild to moderate multilevel cervical spine DDD, worse at C5 - C6.   Original Report Authenticated By: Tacey Ruiz, MD    Mr Brain Wo Contrast  02/01/2013  *RADIOLOGY REPORT*  Clinical Data: Acute encephalopathy.  History of Parkinson's disease, diabetes, and hypertension. Multiple recent falls with increasing confusion.  MRI HEAD WITHOUT CONTRAST  Technique:  Multiplanar, multiecho pulse sequences of the brain and surrounding structures were obtained according to standard protocol without intravenous contrast.  Comparison: Most recent CT 01/30/2013.  Most recent MR 08/16/2012.  Findings: No acute stroke or bleed.  No mass lesion, hydrocephalus, or subdural hemorrhage.  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  There is slight elevation of torcula and slight flattening of the vermis.  These findings are not acute.  Advanced atrophy is present.  Extensive chronic microvascular ischemic changes noted.  There are no foci of chronic hemorrhage. The carotid and basilar arteries widely patent.  There is no visible acute osseous abnormality. There is no acute sinus fluid. The orbits are negative.  Compared with priors, a similar appearance is noted.  IMPRESSION: Atrophy and chronic microvascular ischemic change.  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  No acute stroke, intracranial hemorrhage,  subdural hematoma, or other significant acute/reversible finding.   Original Report Authenticated By: Davonna Belling, M.D.    Dg Shoulder Left  01/30/2013  *RADIOLOGY REPORT*  Clinical Data: Fall.  Shoulder pain.  LEFT SHOULDER - 2+ VIEW  Comparison: 06/13/2012.  Findings: Remote left rib fractures.  Marked glenohumeral joint degenerative changes with loose bodies. Moderate acromioclavicular joint degenerative changes.  No acute fracture or dislocation.  IMPRESSION: No acute fracture or dislocation.  Degenerative changes as noted above.   Original Report Authenticated By: Lacy Duverney, M.D.    Dg Hand Complete Right  01/30/2013  *RADIOLOGY REPORT*  Clinical Data: Fall.  Laceration along the metacarpophalangeal joints.  RIGHT HAND - COMPLETE 3+ VIEW  Comparison: None.  Findings: Peripheral spurring with some chronic osteophyte fragmentation noted along the interphalangeal joints.  There is considerable spurring in the heads of the second and third metacarpals.  Several well corticated ossification centers or fragments are present between the ulna and the lunate/triquetrum.  Mild minus ulnar variance.  No findings of lunate malacia. Spurring at the first carpal metacarpal articulation noted.  No acute fracture or foreign body identified. No chondrocalcinosis in the TFCC.  IMPRESSION:  1. Chronic-appearing fragmented spurring, without fracture or acute bony findings.   Original Report Authenticated By: Gaylyn Rong, M.D.     Microbiology: Recent Results (from the past 240 hour(s))  URINE CULTURE     Status: None   Collection Time    01/30/13 11:07 PM      Result Value Range Status   Specimen Description URINE, CATHETERIZED   Final   Special Requests NONE   Final   Culture  Setup Time 01/31/2013 05:53   Final   Colony Count NO GROWTH   Final   Culture NO GROWTH   Final   Report Status 02/01/2013 FINAL   Final  MRSA PCR SCREENING     Status: None   Collection Time    01/31/13 10:48 PM       Result Value Range Status   MRSA by PCR NEGATIVE  NEGATIVE Final   Comment:            The GeneXpert MRSA Assay (FDA     approved for NASAL specimens     only), is one component of a     comprehensive MRSA colonization     surveillance program. It is not     intended to diagnose MRSA     infection nor to guide or     monitor treatment for     MRSA infections.     Labs: Basic Metabolic Panel:  Recent Labs Lab 01/30/13 1940 01/31/13 1745 02/01/13 0515  NA 140 142 142  K 4.3 4.3 4.1  CL 108 106 109  CO2  --  25 25  GLUCOSE 101* 88 97  BUN 22 22 23   CREATININE 1.30 1.30 1.14  CALCIUM  --  9.3 8.9   Liver Function Tests:  Recent Labs Lab 01/31/13 1745 02/01/13 0515  AST 16 14  ALT 12 <5  ALKPHOS 102 88  BILITOT 0.3 0.2*  PROT 6.2 5.3*  ALBUMIN 3.8 3.4*   No results found for this basename: LIPASE, AMYLASE,  in the last 168 hours No results found for this basename: AMMONIA,  in the last 168 hours CBC:  Recent Labs Lab 01/30/13 1940 01/31/13 1325 02/01/13 0515  WBC  --  8.1 7.0  NEUTROABS  --  6.0  --   HGB 11.2* 12.9* 11.9*  HCT 33.0* 39.6 37.0*  MCV  --  88.2 89.2  PLT  --  279 280   Cardiac Enzymes: No results found for this basename: CKTOTAL, CKMB, CKMBINDEX, TROPONINI,  in the last 168 hours BNP: BNP (last 3 results) No results found for this basename: PROBNP,  in the last 8760 hours CBG: No results found for this basename: GLUCAP,  in the last 168 hours  Time coordinating discharge: 45 minutes  Signed:  Allex Lapoint  [facility] 02/03/2013, 12:22 PM

## 2013-02-02 NOTE — Progress Notes (Signed)
Nutrition Brief Note  Patient identified on the Malnutrition Screening Tool (MST) Report with a score of 2.   Body mass index is 20 kg/(m^2). Patient meets criteria for normal weight based on current BMI.   Current diet order is Dysphagia 3, thin, patient is consuming approximately 100% of meals at this time. Labs and medications reviewed.   No nutrition interventions warranted at this time. If nutrition issues arise, please consult RD.   Linnell Fulling, RD, LDN Pager #: [phone] After-Hours Pager #: [phone]

## 2013-02-02 NOTE — Progress Notes (Signed)
Clinical Social Work Department CLINICAL SOCIAL WORK PLACEMENT NOTE 02/02/2013  Patient:  [name], [name]  Account Number:  [id number] Admit date:  01/31/2013  Clinical Social Worker:  Leron Croak, CLINICAL SOCIAL WORKER  Date/time:  02/02/2013 06:40 PM  Clinical Social Work is seeking post-discharge placement for this patient at the following level of care:   SKILLED NURSING   (*CSW will update this form in Epic as items are completed)   02/02/2013  Patient/family provided with [facility] Department of Clinical Social Work's list of facilities offering this level of care within the geographic area requested by the patient (or if unable, by the patient's family).  02/02/2013  Patient/family informed of their freedom to choose among providers that offer the needed level of care, that participate in Medicare, Medicaid or managed care program needed by the patient, have an available bed and are willing to accept the patient.  02/02/2013  Patient/family informed of [facility]' ownership interest in [facility], as well as of the fact that they are under no obligation to receive care at this facility.  PASARR submitted to [employer] on 04/28/2009 PASARR number received from [employer] on 04/28/2009  FL2 transmitted to all facilities in geographic area requested by pt/family on  02/02/2013 FL2 transmitted to all facilities within larger geographic area on 02/02/2013  Patient informed that his/her managed care company has contracts with or will negotiate with  certain facilities, including the following:     Patient/family informed of bed offers received:   Patient chooses bed at  Physician recommends and patient chooses bed at    Patient to be transferred to  on   Patient to be transferred to facility by   The following physician request were entered in Epic:   Additional Comments:    Leron Croak, Leeroy Bock [facility] Weekend Coverage [phone]

## 2013-02-03 MED ORDER — AQUAPHOR EX OINT
TOPICAL_OINTMENT | Freq: Two times a day (BID) | CUTANEOUS | Status: DC | PRN
  Filled 2013-02-03: qty 50

## 2013-02-03 MED ORDER — AMLODIPINE BESYLATE 5 MG PO TABS
5.0000 mg | ORAL_TABLET | Freq: Every day | ORAL | Status: DC
  Administered 2013-02-03: 5 mg via ORAL
  Filled 2013-02-03: qty 1

## 2013-02-03 MED ORDER — HYDRALAZINE HCL 25 MG PO TABS: 25.0000 mg | ORAL_TABLET | Freq: Three times a day (TID) | ORAL | Status: DC

## 2013-02-03 MED ORDER — HYDRALAZINE HCL 25 MG PO TABS
25.0000 mg | ORAL_TABLET | Freq: Three times a day (TID) | ORAL | Status: DC
  Administered 2013-02-03: 25 mg via ORAL
  Filled 2013-02-03 (×3): qty 1

## 2013-02-03 MED ORDER — LISINOPRIL 10 MG PO TABS: 10.0000 mg | ORAL_TABLET | Freq: Every day | ORAL | Status: DC

## 2013-02-03 MED ORDER — AQUAPHOR EX OINT: TOPICAL_OINTMENT | Freq: Two times a day (BID) | CUTANEOUS | Status: DC | PRN

## 2013-02-03 MED ORDER — ACETAMINOPHEN 325 MG PO TABS: 650.0000 mg | ORAL_TABLET | Freq: Four times a day (QID) | ORAL | Status: DC | PRN

## 2013-02-03 MED ORDER — AMLODIPINE BESYLATE 5 MG PO TABS: 5.0000 mg | ORAL_TABLET | Freq: Every day | ORAL | Status: DC

## 2013-02-03 MED ORDER — LISINOPRIL 10 MG PO TABS
10.0000 mg | ORAL_TABLET | Freq: Every day | ORAL | Status: DC
  Administered 2013-02-03: 10 mg via ORAL
  Filled 2013-02-03: qty 1

## 2013-02-03 NOTE — Progress Notes (Signed)
Physical Therapy Treatment Patient Details Name: [name]. MRN: [id number] DOB: 01-Sep-1935 Today's Date: 02/03/2013 Time: 7829-5621 PT Time Calculation (min): 21 min  PT Assessment / Plan / Recommendation Comments on Treatment Session  Pt. is unsteady wuith ambulation and tends to run into objects. Pt. is agitated when corrected. Pt. will benefit from HHPT at ALF.     Follow Up Recommendations  Home health PT;Supervision for mobility/OOB;Supervision - Intermittent     Does the patient have the potential to tolerate intense rehabilitation     Barriers to Discharge        Equipment Recommendations  None recommended by PT    Recommendations for Other Services    Frequency Min 3X/week   Plan Discharge plan remains appropriate;Frequency remains appropriate    Precautions / Restrictions Precautions Precautions: Fall   Pertinent Vitals/Pain     Mobility  Bed Mobility Supine to Sit: 5: Supervision;HOB flat Sitting - Scoot to Edge of Bed: 5: Supervision Transfers Sit to Stand: From elevated surface;From bed;With upper extremity assist;4: Min guard Stand to Sit: 4: Min guard;With armrests;To chair/3-in-1 Details for Transfer Assistance: Pt. is impulsive, just lurched to stand up. Ambulation/Gait Ambulation/Gait Assistance: 4: Min assist Ambulation Distance (Feet): 40 Feet Assistive device: Rolling walker Ambulation/Gait Assistance Details: cues for safety, slow pace, guidance to avoid objects in hallway. Pt. becomes agitarted when corrected. Gait Pattern: Step-through pattern;Trunk flexed Gait velocity: fast    Exercises     PT Diagnosis:    PT Problem List:   PT Treatment Interventions:     PT Goals Acute Rehab PT Goals Pt will go Sit to Stand: with supervision PT Goal: Sit to Stand - Progress: Progressing toward goal Pt will go Stand to Sit: with supervision PT Goal: Stand to Sit - Progress: Met Pt will Ambulate: >150 feet;with rolling walker;with  supervision PT Goal: Ambulate - Progress: Updated due to goal met  Visit Information  Last PT Received On: 02/03/13 Assistance Needed: +1    Subjective Data  Subjective: Don't hold me. I can walk.   Cognition  Cognition Overall Cognitive Status: Impaired Behavior During Session: Agitated Attention - Other Comments: distractions in room cause increased time to get out his thoughts;     Balance  Dynamic Sitting Balance Dynamic Sitting - Level of Assistance: 5: Stand by assistance  End of Session PT - End of Session Activity Tolerance: Patient tolerated treatment well Patient left: in chair;with chair alarm set;with family/visitor present;with nursing in room Nurse Communication: Mobility status   GP     Rada Hay 02/03/2013, 10:41 AM [phone]

## 2013-02-03 NOTE — Progress Notes (Signed)
Patient is set to discharge back to [facility]. CSW spoke with patient's ex-wife, Eber Jones (ph#: [phone]) and provided SNF bed offers. Wife states she would prefer that he return to [facility] before she makes a decision. [name] aware. [facility] called for transport. Discharge packet in [facility] with AVS, FL2 & chart copy.   Clinical Social Work Department CLINICAL SOCIAL WORK PLACEMENT NOTE 02/03/2013  Patient:  [name], [name]  Account Number:  [id number] Admit date:  01/31/2013  Clinical Social Worker:  Leron Croak, CLINICAL SOCIAL WORKER  Date/time:  02/02/2013 06:40 PM  Clinical Social Work is seeking post-discharge placement for this patient at the following level of care:   SKILLED NURSING   (*CSW will update this form in Epic as items are completed)   02/02/2013  Patient/family provided with [facility] Department of Clinical Social Work's list of facilities offering this level of care within the geographic area requested by the patient (or if unable, by the patient's family).  02/02/2013  Patient/family informed of their freedom to choose among providers that offer the needed level of care, that participate in Medicare, Medicaid or managed care program needed by the patient, have an available bed and are willing to accept the patient.  02/02/2013  Patient/family informed of [facility]' ownership interest in [facility], as well as of the fact that they are under no obligation to receive care at this facility.  PASARR submitted to EDS on 04/28/2009 PASARR number received from [employer] on 04/28/2009  FL2 transmitted to all facilities in geographic area requested by pt/family on  02/02/2013 FL2 transmitted to all facilities within larger geographic area on 02/02/2013  Patient informed that his/her managed care company has contracts with or will negotiate with  certain facilities, including the following:     Patient/family informed of bed offers received:   02/03/2013 Patient chooses bed at [name] Physician recommends and patient chooses bed at    Patient to be transferred to [name] on  02/03/2013 Patient to be transferred to facility by [facility]  The following physician request were entered in Epic:   Additional Comments:  Unice Bailey, LCSW [facility] Clinical Social Worker cell #: [phone]

## 2013-02-03 NOTE — Progress Notes (Signed)
PT. IS BEING DISCHARGED TO [name] REPORT HAS BEEN GIVEN TO LORETTA.

## 2013-02-03 NOTE — Plan of Care (Signed)
Problem: Phase III Progression Outcomes Goal: Foley discontinued Outcome: Not Applicable Date Met:  02/03/13 Patient with chronic foley, will d/c with foley.

## 2013-02-03 NOTE — Evaluation (Signed)
Occupational Therapy Evaluation Patient Details Name: [name]. MRN: [id number] DOB: 23-Oct-1935 Today's Date: 02/03/2013 Time: 4098-1191 OT Time Calculation (min): 22 min  OT Assessment / Plan / Recommendation Clinical Impression  Patient is a 78 y/o male admitted s/p fall at his ALF with head laceration and question UTI with indwelling Foley. Pt is unsafe with mobility and becomes agitated when therapist trys to correct him. On multiple occasions pt would run into large obstacles and the wall. Max cues needed to pace self and slow down. Skilled OT indicated to maximize inpependence with BADLs to supervision level in prep for d/c to next venue.    OT Assessment  Patient needs continued OT Services    Follow Up Recommendations  Home health OT;Supervision/Assistance - 24 hour    Barriers to Discharge      Equipment Recommendations  3 in 1 bedside comode    Recommendations for Other Services    Frequency  Min 2X/week    Precautions / Restrictions Precautions Precautions: Fall Restrictions Weight Bearing Restrictions: No   Pertinent Vitals/Pain Pt denied pain.    ADL  Grooming: Brushing hair;Set up Where Assessed - Grooming: Unsupported sitting Upper Body Bathing: Minimal assistance Where Assessed - Upper Body Bathing: Unsupported sitting Lower Body Bathing: Minimal assistance Where Assessed - Lower Body Bathing: Supported sit to stand Upper Body Dressing: Minimal assistance Where Assessed - Upper Body Dressing: Supported sit to stand Lower Body Dressing: Moderate assistance Where Assessed - Lower Body Dressing: Supported sit to stand Toilet Transfer: Hydrographic surveyor Method: Sit to Barista: Other (comment) (to recliner) Toileting - Clothing Manipulation and Hygiene: Minimal assistance Where Assessed - Toileting Clothing Manipulation and Hygiene: Sit to stand from 3-in-1 or toilet Equipment Used: Rolling  walker Transfers/Ambulation Related to ADLs: Pt impulsive with mobility and does not like to be cued or corrected. Very easily distracted and agitated. Pt also paranoid at times asking if therapists were going to give him a bad report....    OT Diagnosis: Generalized weakness  OT Problem List: Decreased activity tolerance;Decreased safety awareness;Decreased strength;Impaired balance (sitting and/or standing);Decreased knowledge of use of DME or AE OT Treatment Interventions: Self-care/ADL training;Therapeutic activities;DME and/or AE instruction;Patient/family education;Balance training   OT Goals Acute Rehab OT Goals OT Goal Formulation: With patient Time For Goal Achievement: 02/17/13 Potential to Achieve Goals: Fair ADL Goals Pt Will Perform Grooming: with supervision;Standing at sink ADL Goal: Grooming - Progress: Goal set today Pt Will Transfer to Toilet: with supervision;Ambulation;with DME ADL Goal: Toilet Transfer - Progress: Goal set today Pt Will Perform Toileting - Clothing Manipulation: with supervision;Sitting on 3-in-1 or toilet ADL Goal: Toileting - Clothing Manipulation - Progress: Goal set today Pt Will Perform Toileting - Hygiene: with supervision;Sit to stand from 3-in-1/toilet ADL Goal: Toileting - Hygiene - Progress: Goal set today Additional ADL Goal #1: Pt will complete all aspects of bathing and dressing with supervision. ADL Goal: Additional Goal #1 - Progress: Goal set today  Visit Information  Last OT Received On: 02/03/13 Assistance Needed: +1 PT/OT Co-Evaluation/Treatment: Yes    Subjective Data  Subjective: Are you going to give me a bad report? Patient Stated Goal: Return back to ALF today.   Prior Functioning     Home Living Home Adaptive Equipment: Walker - rolling Additional Comments: Pt has an aide that helps him each day fom 11-8. Prior Function Level of Independence: Needs assistance Needs Assistance: Bathing;Dressing;Toileting;Meal  Prep;Light Housekeeping Driving: No Vocation: Retired Comments: Pt did not  elaborate on how much assistance he needs at home, at one point stating, "I can bathe and dress myself but my nurse wont let me. Communication Communication: No difficulties Dominant Hand: Right         Vision/Perception     Cognition  Cognition Overall Cognitive Status: Impaired Behavior During Session: Agitated Attention - Other Comments: distractions in room cause increased time to get out his thoughts;     Extremity/Trunk Assessment Right Upper Extremity Assessment RUE ROM/Strength/Tone: Deficits RUE ROM/Strength/Tone Deficits: shoulder elevation limited to approx 90*, strenth 4/5 grossly. Left Upper Extremity Assessment LUE ROM/Strength/Tone: Deficits LUE ROM/Strength/Tone Deficits: shoulder elevation limited to approx 80* with crepitus when palpated.strenth 4/5 grossly.     Mobility Bed Mobility Supine to Sit: 5: Supervision;HOB flat Sitting - Scoot to Edge of Bed: 5: Supervision Transfers Sit to Stand: From elevated surface;From bed;With upper extremity assist;4: Min guard Stand to Sit: 4: Min guard;With armrests;To chair/3-in-1 Details for Transfer Assistance: Pt. is impulsive, just lurched to stand up.     Exercise     Balance Dynamic Sitting Balance Dynamic Sitting - Level of Assistance: 5: Stand by assistance   End of Session OT - End of Session Activity Tolerance: Treatment limited secondary to agitation Patient left: in chair;with call bell/phone within reach  GO     Logen Heintzelman A OTR/L [phone] 02/03/2013, 12:13 PM

## 2013-03-23 IMAGING — CT CT HEAD W/O CM
4 of 6 series · 16 of 47 positions shown, 18 images · non-contrast
Comparison: Head and C-spine CT - 09/06/2012; 08/20/2012

CT HEAD

CLINICAL DATA: Post fall with lacerations above both eye brows,
now with severe headache, neck pain

CT HEAD WITHOUT CONTRAST
CT CERVICAL SPINE WITHOUT CONTRAST
TECHNIQUE: Multidetector CT imaging of the head and cervical spine
was performed following the standard protocol without intravenous
contrast.  Multiplanar CT image reconstructions of the cervical
spine were also generated.

[Series 3: head w/o · axial · non-contrast · 0.48mm/px · z∈[-113,-53]mm · 2 of 36 slices shown]
[im 12/36  brain]
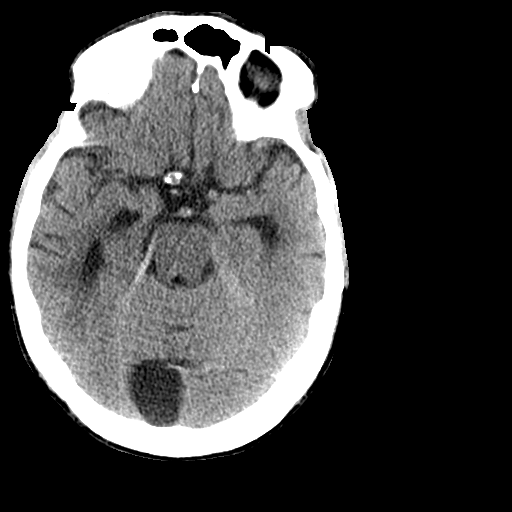
[im 24/36  brain]
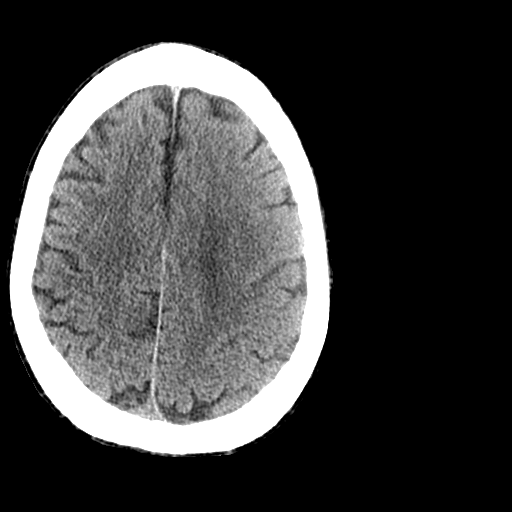

[Series 602: <mpr thick range> · coronal · 0.37mm/px · 3 of 68 slices shown]
[im 23/68  brain]
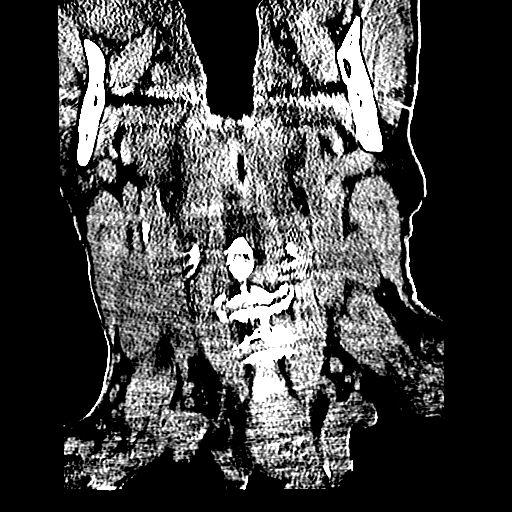
[im 30/68  brain]
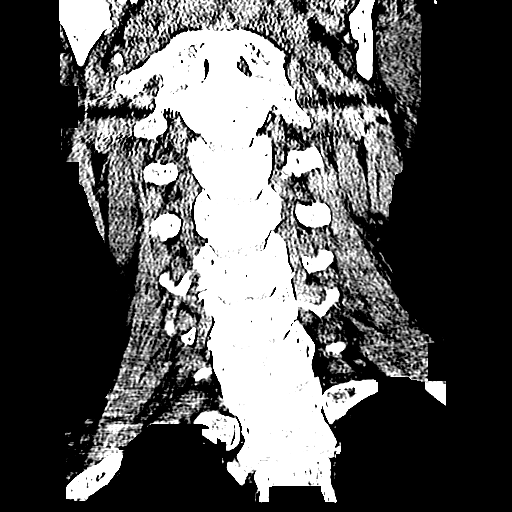
[im 38/68  brain]
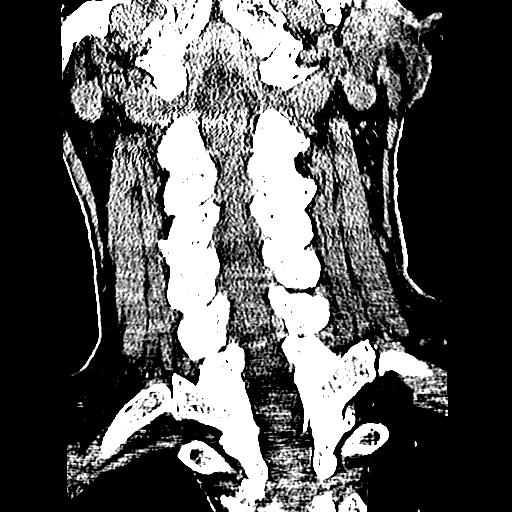

[Series 603: <mpr thick range(1)> · axial · 0.37mm/px · z∈[-365,-218]mm · 8 of 102 slices shown, 10 images]
[im 12/102  brain]
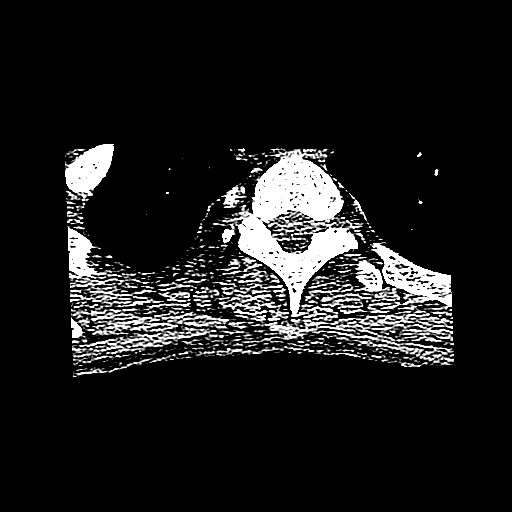
[im 12/102  bone]
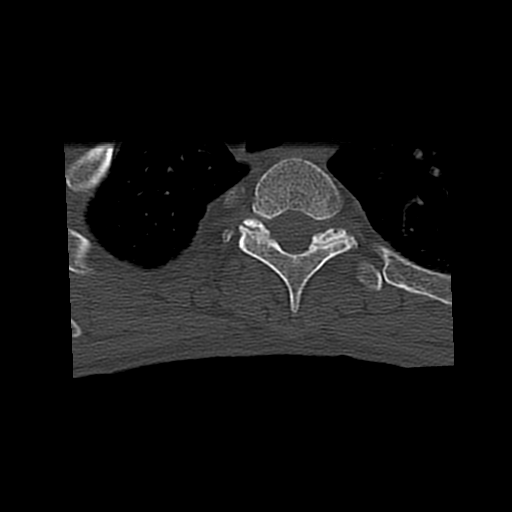
[im 23/102  brain]
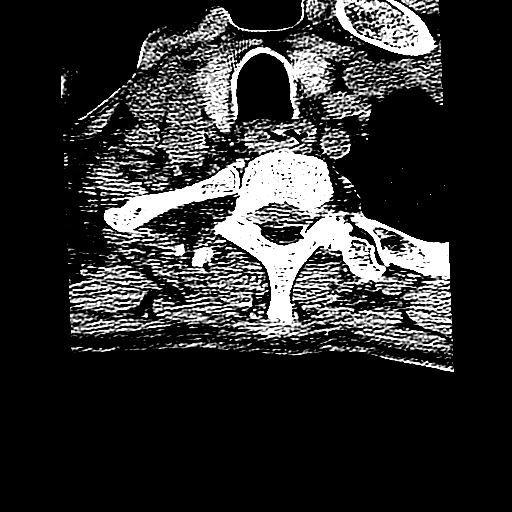
[im 34/102  brain]
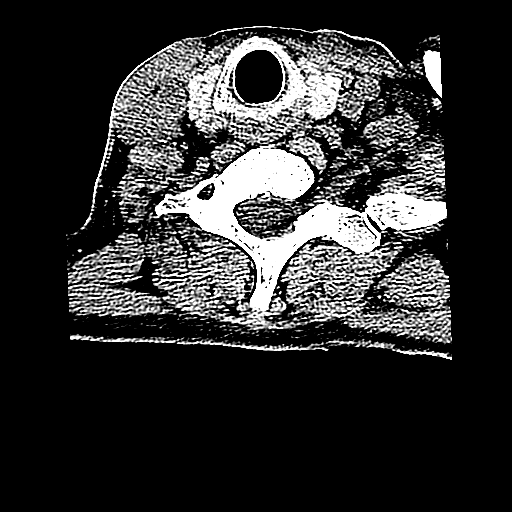
[im 45/102  brain]
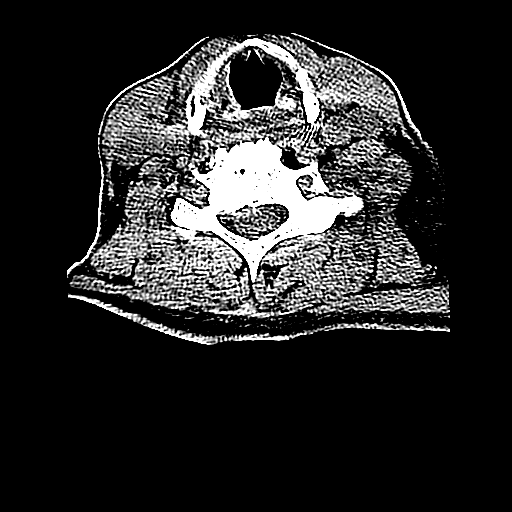
[im 57/102  brain]
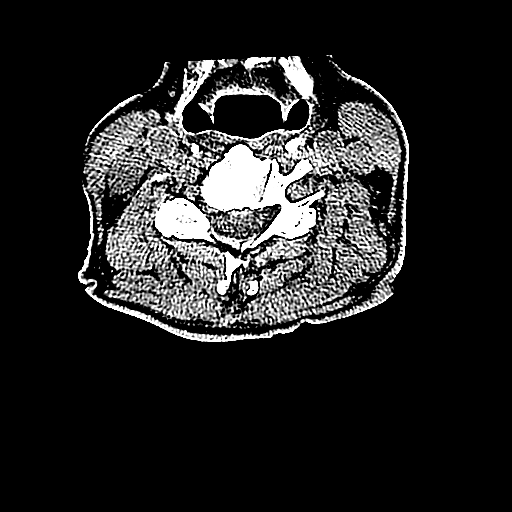
[im 57/102  bone]
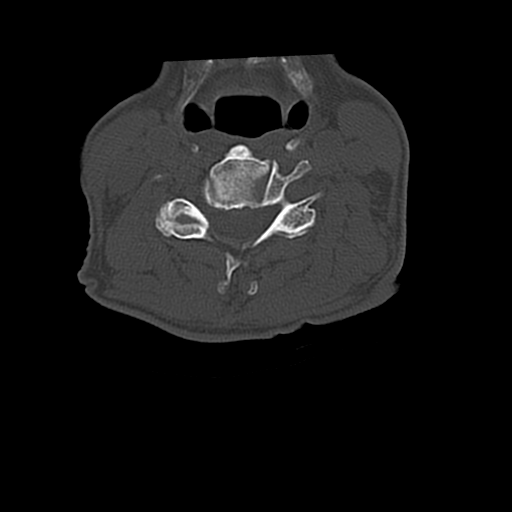
[im 68/102  brain]
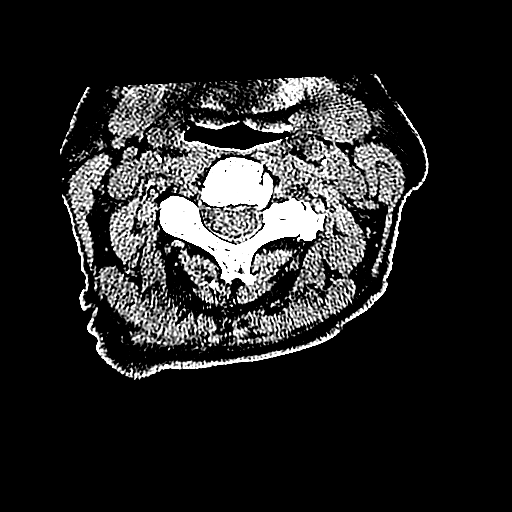
[im 79/102  brain]
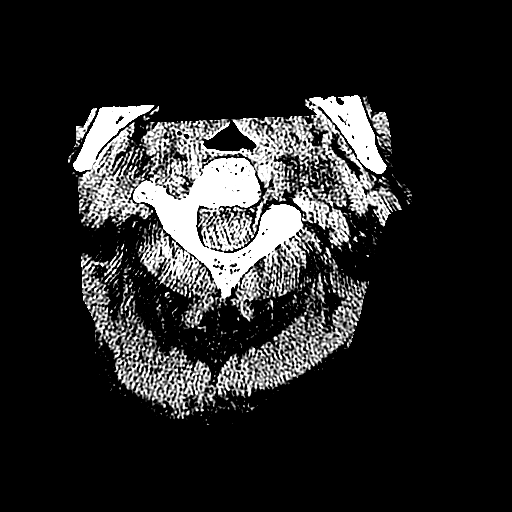
[im 90/102  brain]
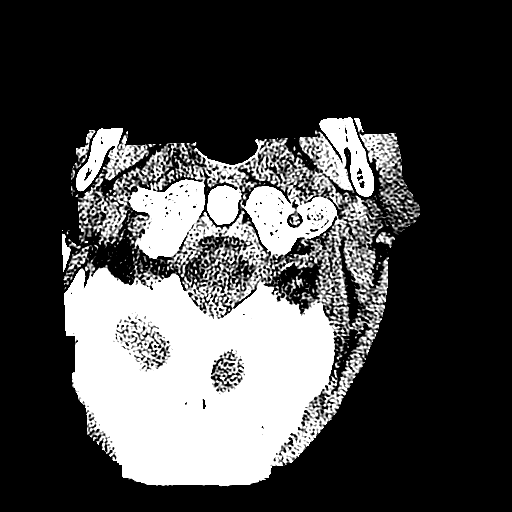

[Series 604: <mpr thick range(2)> · sagittal · 0.37mm/px · 3 of 58 slices shown]
[im 20/58  brain]
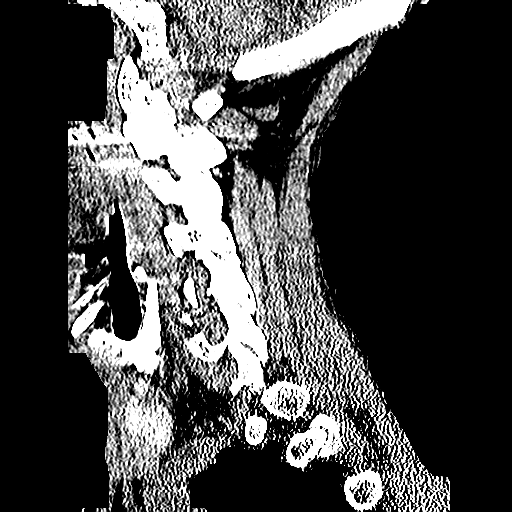
[im 29/58  brain]
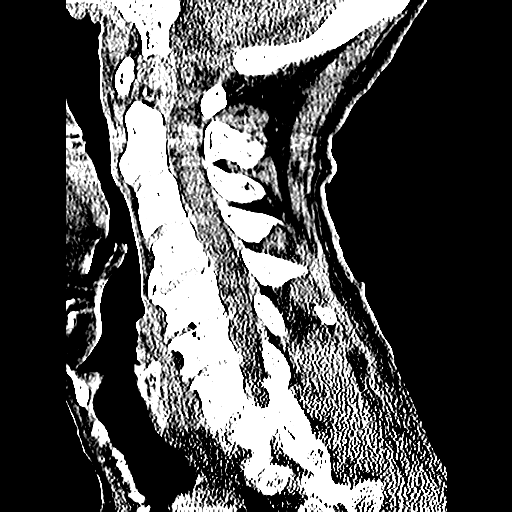
[im 39/58  brain]
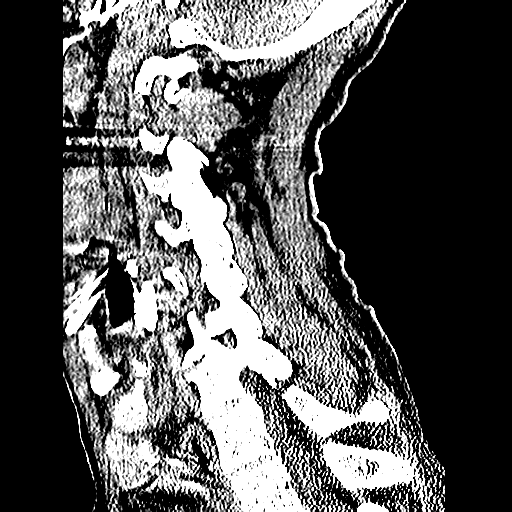

[16 of 47 positions shown; findings below may reference images not displayed]

FINDINGS: Grossly unchanged advanced atrophy with a 0 days active dilatation
of the ventricular system.  Incidental note is made of a mega
cisterna magna.  Scattered periventricular hypodensities are
grossly unchanged incompatible microvascular ischemic disease.
Given background parenchymal abnormalities, there is no CT evidence
of acute large territory infarct.  No intraparenchymal or extra-
axial mass or hemorrhage.  No midline shift.  Unchanged size
configuration of the ventricles and basilar cisterns.

Regional soft tissues are normal.  No displaced calvarial fracture.
Incidental note is again made of underpneumatization the bilateral
mastoid air cells.  Limited visualization of the paranasal sinuses
is otherwise normal.
IMPRESSION: 1.  No acute intracranial process.
2.  Advanced atrophy and microvascular ischemic disease.
3.  Incidental note again made of a mega cisterna magna.

CT CERVICAL SPINE
FINDINGS: The C1 to the superior endplate of T2 is imaged.  There is a mild
scoliotic curvature of the cervical spine, convex to the right,
possibly positional.  No anterolisthesis or retrolisthesis.  The
bilateral facets are normally aligned.  The dens is normally
positioned between the lateral masses of C1.  Normal atlantodental
atlantoaxial articulation.

No fracture or static subluxation of the cervical spine.  A
prominent nutrient foramen is again seen within through the
superior articular surface of the right lateral facet of the C2
vertebral body.  Cervical vertebral body heights are preserved.
Prevertebral soft tissues are normal.

Mild to moderate multilevel cervical spine DDD, worse at C5 - C6
and to a lesser extent, C6 - C7 and C4 - C5 with disc space height
loss, end plate irregularity and primarily anteriorly directed disc
osteophyte complexes at these levels.

Limited visualization of the lung apices is normal.  Minimal
vascular calcifications within the bilateral carotid bulbs and
within the distal aspect of the right vertebral artery.  Regional
soft tissues otherwise normal.
IMPRESSION: 1.  No fracture or static subluxation of the cervical spine.
2.  Mild to moderate multilevel cervical spine DDD, worse at C5 -
C6.

## 2013-04-17 ENCOUNTER — Ambulatory Visit (INDEPENDENT_AMBULATORY_CARE_PROVIDER_SITE_OTHER): Payer: Medicare Other | Admitting: Neurology

## 2013-04-17 ENCOUNTER — Encounter: Payer: Self-pay | Admitting: Neurology

## 2013-04-17 DIAGNOSIS — R269 Unspecified abnormalities of gait and mobility: Secondary | ICD-10-CM

## 2013-04-17 DIAGNOSIS — G20 Parkinson's disease: Secondary | ICD-10-CM

## 2013-04-17 DIAGNOSIS — F319 Bipolar disorder, unspecified: Secondary | ICD-10-CM

## 2013-04-17 NOTE — Progress Notes (Signed)
Reason for visit: Parkinson's disease  [name]. is an 78 y.o. male  History of present illness:  [name] is a 78 year old right-handed white male with a history of Parkinson's disease. The patient has done well on Sinemet and Comtan. The patient is on a 25/250 mg Sinemet tablet taking 1 tablet 3 times daily. The patient indicates that he is tolerating these medications well. The patient denies any problems with sleeping, and he denies any vivid dreams. The patient is not having any problems with speech or swallowing. The patient has ongoing problems with gait instability, and he is able to walk with a walker. When there is no one present, he uses a wheelchair for mobilization. The patient was in the hospital in February 2014 with an episode of confusion. The etiology of the episode is unknown. MRI evaluation of the brain at that time did not show any acute changes. The patient returns for an evaluation.  Past Medical History  Diagnosis Date  . Hearing aid worn   . Hypertension   . Parkinson disease   . Pneumonia   . Diabetes mellitus type II   . Arthritis   . Bipolar disorder   . Anxiety   . Depression   . MRSA infection (methicillin-resistant Staphylococcus aureus)   . Chronic indwelling foley catheter   . C2 cervical fracture     due to pt fall  . SIRS (systemic inflammatory response syndrome)   . Neuromuscular disorder   . Memory loss   . Benign prostate hyperplasia     Past Surgical History  Procedure Laterality Date  . Total hip arthroplasty    . Video bronchoscopy  12/16/2012    Procedure: VIDEO BRONCHOSCOPY;  Surgeon: Loreli Slot, MD;  Location: [facility] OR;  Service: Thoracic;  Laterality: Right;  . Video assisted thoracoscopy (vats)/empyema  12/16/2012    Procedure: VIDEO ASSISTED THORACOSCOPY (VATS)/EMPYEMA;  Surgeon: Loreli Slot, MD;  Location: [facility] OR;  Service: Thoracic;  Laterality: Right;    Family History  Problem Relation Age of  Onset  . Depression Father   . Cancer Mother   . Heart disease Mother     Social history:  reports that he has never smoked. He has never used smokeless tobacco. He reports that he does not drink alcohol or use illicit drugs.  Allergies:  Allergies  Allergen Reactions  . Cholestatin Other (See Comments)    Per MAR    Medications:  Current Outpatient Prescriptions on File Prior to Visit  Medication Sig Dispense Refill  . acetaminophen (TYLENOL) 325 MG tablet Take 2 tablets (650 mg total) by mouth every 6 (six) hours as needed for pain or fever.      Marland Kitchen amLODipine (NORVASC) 5 MG tablet Take 1 tablet (5 mg total) by mouth daily.      Marland Kitchen aspirin 81 MG chewable tablet Chew 81 mg by mouth daily.      . carbidopa-levodopa (SINEMET) 25-250 MG per tablet Take 1 tablet by mouth 3 (three) times daily.       . clonazePAM (KLONOPIN) 0.5 MG tablet Take 0.5 mg by mouth 2 (two) times daily as needed. For anxiety      . Cranberry (AZO-CRANBERRY) 450 MG TABS Take 1 tablet by mouth every morning.      . entacapone (COMTAN) 200 MG tablet Take 200 mg by mouth 3 (three) times daily.       Marland Kitchen guaiFENesin (MUCINEX) 600 MG 12 hr tablet Take 1,200 mg  by mouth 2 (two) times daily as needed for congestion.       . hydrALAZINE (APRESOLINE) 25 MG tablet Take 1 tablet (25 mg total) by mouth every 8 (eight) hours.      Marland Kitchen lamoTRIgine (LAMICTAL) 150 MG tablet Take 150 mg by mouth 2 (two) times daily.      Marland Kitchen lisinopril (PRINIVIL,ZESTRIL) 10 MG tablet Take 1 tablet (10 mg total) by mouth daily.      . mineral oil-hydrophilic petrolatum (AQUAPHOR) ointment Apply topically 2 (two) times daily as needed for dry skin (on heels).  420 g    . Multiple Vitamin (MULTIVITAMIN WITH MINERALS) TABS Take 1 tablet by mouth every morning.       . naproxen (NAPROSYN) 375 MG tablet Take 375 mg by mouth daily with breakfast.      . naproxen (NAPROSYN) 500 MG tablet Take 1 tablet (500 mg total) by mouth 2 (two) times daily with a meal.  30  tablet  0  . omeprazole (PRILOSEC) 20 MG capsule Take 20 mg by mouth every morning.      . polyethylene glycol (MIRALAX / GLYCOLAX) packet Take 17 g by mouth daily.      . Red Yeast Rice Extract (RED YEAST RICE PO) Take 2 tablets by mouth at bedtime.        . sertraline (ZOLOFT) 50 MG tablet Take 150 mg by mouth every morning.       . traZODone (DESYREL) 50 MG tablet Take 50 mg by mouth at bedtime as needed. sleep      . Valerian Root 500 MG CAPS Take 1 capsule by mouth at bedtime.       No current facility-administered medications on file prior to visit.    ROS:  Out of a complete 14 system review of symptoms, the patient complains only of the following symptoms, and all other reviewed systems are negative.  Depression, anxiety Gait instability Memory problems  There were no vitals taken for this visit.  Physical Exam  General: The patient is alert and cooperative at the time of the examination.  Skin: No significant peripheral edema is noted.   Neurologic Exam  Mental status: Mini-Mental status examination done today shows a total score of 18/30. The patient is able to name 9 animals in 60 seconds.  Cranial nerves: Facial symmetry is present. Speech is normal, no aphasia or dysarthria is noted. Extraocular movements are full. Visual fields are full. Mild masking of the face is seen.  Motor: The patient has good strength in all 4 extremities.  Coordination: The patient has good finger-nose-finger and heel-to-shin bilaterally.  Gait and station: The patient has an excellent stride with use of a walker. The patient is able to stand with difficulty from a seated position with arms crossed. Once he stands up, he has a tendency to lean backwards. Tandem gait was not tested. Romberg is unsteady. No drift is seen.  Reflexes: Deep tendon reflexes are symmetric.   Assessment/Plan:  1. Parkinson's disease  2. Gait disorder  3. Bipolar disorder  The patient is actually doing  quite well at this time with his ability to ambulate. The patient has an excellent gait when he is using a walker. The patient may have a tendency to fall backwards when he gets up out of a chair. At this point, no adjustments to his medication will be made at this time. The patient followup in 4 or 5 months.  Marlan Palau MD 04/17/2013 8:44 PM  [facility] [address] [place], [place] [address]  Phone [phone] Fax [phone]

## 2013-04-22 ENCOUNTER — Ambulatory Visit (INDEPENDENT_AMBULATORY_CARE_PROVIDER_SITE_OTHER): Payer: Medicare Other | Admitting: Psychiatry

## 2013-04-22 ENCOUNTER — Encounter (HOSPITAL_COMMUNITY): Payer: Self-pay | Admitting: Psychiatry

## 2013-04-22 VITALS — BP 131/70 | HR 52 | Ht 71.0 in | Wt 152.6 lb

## 2013-04-22 DIAGNOSIS — F319 Bipolar disorder, unspecified: Secondary | ICD-10-CM

## 2013-04-22 DIAGNOSIS — F09 Unspecified mental disorder due to known physiological condition: Secondary | ICD-10-CM

## 2013-04-22 MED ORDER — QUETIAPINE FUMARATE 50 MG PO TABS: 50.0000 mg | ORAL_TABLET | Freq: Every day | ORAL | Status: DC

## 2013-04-22 MED ORDER — SERTRALINE HCL 50 MG PO TABS: 150.0000 mg | ORAL_TABLET | Freq: Every morning | ORAL | Status: DC

## 2013-04-22 MED ORDER — LAMOTRIGINE 150 MG PO TABS: 150.0000 mg | ORAL_TABLET | Freq: Two times a day (BID) | ORAL | Status: DC

## 2013-04-22 MED ORDER — TRAZODONE HCL 50 MG PO TABS: 50.0000 mg | ORAL_TABLET | Freq: Every evening | ORAL | Status: DC | PRN

## 2013-04-22 NOTE — Progress Notes (Signed)
Patient ID: Dwayne Pea., male   DOB: 1935-09-10, 78 y.o.   MRN: [id number]  [facility] [address] Progress Note  [name] [id number] 78 y.o.  04/22/2013 1:43 PM  Chief Complaint:  I fell in February.  I don't remember my medication.  However I'm doing better.        History of Present Illness:  Patient is 77 year old Caucasian divorced male who came for his appointment with his aide.  Patient was last seen in November.  Since then he's been admitted in February for fall and UTI.  Patient do not remember his medication.  We have not received current medication from assisted living facility.  However patient was recently seen by Dr. Lesia Sago for his Parkinson.  I reviewed his medication.  He is taking Klonopin one a day.  Before he was taking Ativan it is unclear who changed his Ativan and started him on Klonopin.  Patient has a hard time getting primary care physician.  Patient told to primary care physician left practice in past 6 months.  I have noticed that patient has more memory issues and confusion.  He preferred aide to present in the conversation since he do not remember the details.  As per aide patient is doing better on his medication.  There has been no recent agitation anger or mood swing.  Patient does complain of insomnia however there has been no report for insomnia.  He is taking Lamictal , Seroquel , Klonopin and Zoloft.  He is also taking multiple medications for her physical needs.  Patient denies any recent paranoia or any hallucination.  He has no issues with the staff at the assisted living facility.  He does not like food however there were no aggression or severe mood swings.  Suicidal Ideation: No Plan Formed: No Patient has means to carry out plan: No  Homicidal Ideation: No Plan Formed: No Patient has means to carry out plan: No  Review of Systems  HENT: Positive for hearing loss.   Cardiovascular: Negative for chest pain and palpitations.   Musculoskeletal: Negative for back pain, joint pain and falls.  Neurological: Positive for dizziness, tremors, weakness and headaches. Negative for seizures and loss of consciousness.  Psychiatric/Behavioral: Positive for depression and memory loss. Negative for suicidal ideas, hallucinations and substance abuse. The patient is nervous/anxious and has insomnia.    Past psychiatric history Patient has significant history of bipolar disorder.  He has multiple psychiatric admission due to decompensation and noncompliance of medication.  Patient has history of aggression and impulsive behavior.  His last psychiatric admission was in 2005.  Medical history Patient has history of diabetes mellitus, hypertension, Parkinson disease, gait instability, hitting impairment and cognitive impairment.  He was recently admitted at least twice for repeated UTI.    Family and Social History: Patient lives by herself.  He's been divorced for few years.  He has no children.  He lives in an assisted-living facility and he has a home health aid [phone].  He use to go [facility] but lately he has been not going to these groups due to physical illness.  Outpatient Encounter Prescriptions as of 04/22/2013  Medication Sig Dispense Refill  . lamoTRIgine (LAMICTAL) 150 MG tablet Take 1 tablet (150 mg total) by mouth 2 (two) times daily.  180 tablet  0  . Red Yeast Rice Extract (RED YEAST RICE PO) Take 2 tablets by mouth at bedtime.        Marland Kitchen  sertraline (ZOLOFT) 50 MG tablet Take 3 tablets (150 mg total) by mouth every morning.  90 tablet  0  . traZODone (DESYREL) 50 MG tablet Take 1 tablet (50 mg total) by mouth at bedtime as needed. sleep  90 tablet  0  . [DISCONTINUED] lamoTRIgine (LAMICTAL) 150 MG tablet Take 150 mg by mouth 2 (two) times daily.      . [DISCONTINUED] sertraline (ZOLOFT) 50 MG tablet Take 150 mg by mouth every morning.       . [DISCONTINUED] traZODone (DESYREL) 50 MG tablet Take 50 mg by mouth at bedtime  as needed. sleep      . acetaminophen (TYLENOL) 325 MG tablet Take 2 tablets (650 mg total) by mouth every 6 (six) hours as needed for pain or fever.      Marland Kitchen amLODipine (NORVASC) 5 MG tablet Take 1 tablet (5 mg total) by mouth daily.      Marland Kitchen aspirin 81 MG chewable tablet Chew 81 mg by mouth daily.      . carbidopa-levodopa (SINEMET) 25-250 MG per tablet Take 1 tablet by mouth 3 (three) times daily.       . clonazePAM (KLONOPIN) 0.5 MG tablet Take 0.5 mg by mouth 2 (two) times daily as needed. For anxiety      . Cranberry (AZO-CRANBERRY) 450 MG TABS Take 1 tablet by mouth every morning.      . entacapone (COMTAN) 200 MG tablet Take 200 mg by mouth 3 (three) times daily.       Marland Kitchen guaiFENesin (MUCINEX) 600 MG 12 hr tablet Take 1,200 mg by mouth 2 (two) times daily as needed for congestion.       . hydrALAZINE (APRESOLINE) 25 MG tablet Take 1 tablet (25 mg total) by mouth every 8 (eight) hours.      Marland Kitchen lisinopril (PRINIVIL,ZESTRIL) 10 MG tablet Take 1 tablet (10 mg total) by mouth daily.      . mineral oil-hydrophilic petrolatum (AQUAPHOR) ointment Apply topically 2 (two) times daily as needed for dry skin (on heels).  420 g    . Multiple Vitamin (MULTIVITAMIN WITH MINERALS) TABS Take 1 tablet by mouth every morning.       . naproxen (NAPROSYN) 375 MG tablet Take 375 mg by mouth daily with breakfast.      . naproxen (NAPROSYN) 500 MG tablet Take 1 tablet (500 mg total) by mouth 2 (two) times daily with a meal.  30 tablet  0  . omeprazole (PRILOSEC) 20 MG capsule Take 20 mg by mouth every morning.      . phenazopyridine (PYRIDIUM) 100 MG tablet Take 100 mg by mouth 3 (three) times daily.      . polyethylene glycol (MIRALAX / GLYCOLAX) packet Take 17 g by mouth daily.      . QUEtiapine (SEROQUEL) 50 MG tablet Take 1 tablet (50 mg total) by mouth at bedtime.  90 tablet  0  . Valerian Root 500 MG CAPS Take 1 capsule by mouth at bedtime.      . [DISCONTINUED] QUEtiapine (SEROQUEL) 50 MG tablet Take 50 mg by  mouth at bedtime.       No facility-administered encounter medications on file as of 04/22/2013.    Past Psychiatric History/Hospitalization(s): Anxiety: Yes Bipolar Disorder: Yes Depression: No Mania: No Psychosis: No Schizophrenia: No Personality Disorder: No Hospitalization for psychiatric illness: Yes History of Electroconvulsive Shock Therapy: No Prior Suicide Attempts: No  Physical Exam: Constitutional:  BP 131/70  Pulse 52  Ht 5\' 11"  (1.803  m)  Wt 152 lb 9.6 oz (69.219 kg)  BMI 21.29 kg/m2  General Appearance: Patient is pleasant but confused.  He is fairly groomed.  Musculoskeletal: Strength & Muscle Tone: decreased and atrophy Gait & Station: unsteady Patient leans: Front  Mental status examination Patient is casually dressed and fairly groomed.  He is pleasant and cooperative but he has difficulty remembering things.  He is confused at times.  He require reassurance from her aide to answer question.  His his speech is soft and his thought processes tangential.  He denies any active or passive suicidal thoughts or homicidal thoughts.  He denies any auditory or visual hallucination.  He has difficulty remembering things.  His attention and concentration is poor.  He described his mood is anxious and his affect is mood appropriate.  His immediate recall is good there were no delusion obsession present at this time.  He has fine tremors and has difficulty walking.  He need walker .  His gait is unsteady.  He's alert and oriented x3.  His insight judgment and impulse control is fair.  Medical Decision Making (Choose Three): Established Problem, Stable/Improving (1), Review of Psycho-Social Stressors (1), Review or order clinical lab tests (1), Decision to obtain old records (1), Review of Last Therapy Session (1) and Review of New Medication or Change in Dosage (2)  Assessment: Axis I: Bipolar disorder, cognitive disorder NOS  Axis II: Deferred  Axis III: See medical  history  Axis IV: Moderate  Axis V: 50-55   Plan:  I reviewed recent blood work when he was admitted in February.  At this time he is taking Klonopin 0.5 mg at bedtime, Lamictal 150 mg twice a day, Zoloft 50 mg daily, trazodone 50 mg at bedtime and Seroquel 50 mg at bedtime.  It is unclear who he stopped Ativan and started on Klonopin.  We do not have any nursing notes from assisted living facility.  I spoke to him and his aide to have nursing notes faxed to [place] for the appointment.  At this time we will not change any medication since he is doing better.  I reviewed notes from Dr. Lesia Sago who is managing his Parkinson's disease.  I will continue trazodone Zoloft and Lamictal and Seroquel.  The patient is getting Klonopin from his primary care physician.  He has difficulty finding his permanent primary care physician.  Since six-month he had tried to primary care physician who left.  Currently he is trying to see if  "Dr. Make house call" can see him.  This can benefit explaining detail.  Recommend to call us back if he is a question of concern to her worsening of the septum.  Time spent 25 minutes.  More than 50% of the time spent and psychoeducation, counseling and coordination of care.  I will see him again in 3 months  [name] T., MD 04/22/2013

## 2013-04-23 ENCOUNTER — Telehealth: Payer: Self-pay | Admitting: *Deleted

## 2013-04-28 NOTE — Telephone Encounter (Signed)
No note needed 

## 2013-06-02 ENCOUNTER — Telehealth: Payer: Self-pay | Admitting: Neurology

## 2013-06-02 NOTE — Telephone Encounter (Signed)
I have not read the article. I'll try to find this.

## 2013-06-02 NOTE — Telephone Encounter (Signed)
I spoke to patient and he wanted to know if I or the doctor read the article in the newspaper this morning about memory?  He said he wanted to know what the doctors opinion was?  I told him I didn't know if the doctor read the article but I would pass the message along.  [phone]

## 2013-06-18 ENCOUNTER — Emergency Department (HOSPITAL_COMMUNITY)
Admission: EM | Admit: 2013-06-18 | Discharge: 2013-06-18 | Disposition: A | Discharge status: Skilled Nursing Facility | Payer: Medicare Other | Attending: Emergency Medicine | Admitting: Emergency Medicine

## 2013-06-18 ENCOUNTER — Encounter (HOSPITAL_COMMUNITY): Payer: Self-pay | Admitting: *Deleted

## 2013-06-18 DIAGNOSIS — R141 Gas pain: Secondary | ICD-10-CM | POA: Insufficient documentation

## 2013-06-18 DIAGNOSIS — F39 Unspecified mood [affective] disorder: Secondary | ICD-10-CM | POA: Insufficient documentation

## 2013-06-18 DIAGNOSIS — I10 Essential (primary) hypertension: Secondary | ICD-10-CM | POA: Insufficient documentation

## 2013-06-18 DIAGNOSIS — R413 Other amnesia: Secondary | ICD-10-CM | POA: Insufficient documentation

## 2013-06-18 DIAGNOSIS — R109 Unspecified abdominal pain: Secondary | ICD-10-CM | POA: Insufficient documentation

## 2013-06-18 DIAGNOSIS — Z8781 Personal history of (healed) traumatic fracture: Secondary | ICD-10-CM | POA: Insufficient documentation

## 2013-06-18 DIAGNOSIS — G20A1 Parkinson's disease without dyskinesia, without mention of fluctuations: Secondary | ICD-10-CM | POA: Insufficient documentation

## 2013-06-18 DIAGNOSIS — Z862 Personal history of diseases of the blood and blood-forming organs and certain disorders involving the immune mechanism: Secondary | ICD-10-CM | POA: Insufficient documentation

## 2013-06-18 DIAGNOSIS — Z87448 Personal history of other diseases of urinary system: Secondary | ICD-10-CM | POA: Insufficient documentation

## 2013-06-18 DIAGNOSIS — Z8701 Personal history of pneumonia (recurrent): Secondary | ICD-10-CM | POA: Insufficient documentation

## 2013-06-18 DIAGNOSIS — R142 Eructation: Secondary | ICD-10-CM | POA: Insufficient documentation

## 2013-06-18 DIAGNOSIS — F411 Generalized anxiety disorder: Secondary | ICD-10-CM | POA: Insufficient documentation

## 2013-06-18 DIAGNOSIS — Z8669 Personal history of other diseases of the nervous system and sense organs: Secondary | ICD-10-CM | POA: Insufficient documentation

## 2013-06-18 DIAGNOSIS — E119 Type 2 diabetes mellitus without complications: Secondary | ICD-10-CM | POA: Insufficient documentation

## 2013-06-18 DIAGNOSIS — Y846 Urinary catheterization as the cause of abnormal reaction of the patient, or of later complication, without mention of misadventure at the time of the procedure: Secondary | ICD-10-CM | POA: Insufficient documentation

## 2013-06-18 DIAGNOSIS — Z79899 Other long term (current) drug therapy: Secondary | ICD-10-CM | POA: Insufficient documentation

## 2013-06-18 DIAGNOSIS — F0390 Unspecified dementia without behavioral disturbance: Secondary | ICD-10-CM | POA: Insufficient documentation

## 2013-06-18 DIAGNOSIS — T83091A Other mechanical complication of indwelling urethral catheter, initial encounter: Secondary | ICD-10-CM | POA: Insufficient documentation

## 2013-06-18 DIAGNOSIS — Z9889 Other specified postprocedural states: Secondary | ICD-10-CM | POA: Insufficient documentation

## 2013-06-18 DIAGNOSIS — G20 Parkinson's disease: Secondary | ICD-10-CM | POA: Insufficient documentation

## 2013-06-18 DIAGNOSIS — Z8614 Personal history of Methicillin resistant Staphylococcus aureus infection: Secondary | ICD-10-CM | POA: Insufficient documentation

## 2013-06-18 DIAGNOSIS — Z7982 Long term (current) use of aspirin: Secondary | ICD-10-CM | POA: Insufficient documentation

## 2013-06-18 DIAGNOSIS — N390 Urinary tract infection, site not specified: Secondary | ICD-10-CM | POA: Insufficient documentation

## 2013-06-18 DIAGNOSIS — M129 Arthropathy, unspecified: Secondary | ICD-10-CM | POA: Insufficient documentation

## 2013-06-18 LAB — URINALYSIS, ROUTINE W REFLEX MICROSCOPIC
Glucose, UA: NEGATIVE mg/dL
Specific Gravity, Urine: 1.016 (ref 1.005–1.030)
pH: 6 (ref 5.0–8.0)

## 2013-06-18 LAB — URINE MICROSCOPIC-ADD ON

## 2013-06-18 MED ORDER — CIPROFLOXACIN HCL 500 MG PO TABS: 500.0000 mg | ORAL_TABLET | Freq: Once | ORAL | Status: DC

## 2013-06-18 NOTE — ED Notes (Signed)
Foley irrigated with 60ml of NaCl.  No fluid was returned.  Dr. Norlene Campbell made aware.

## 2013-06-18 NOTE — ED Notes (Signed)
[id number]<AV> Expected date:06/18/13<BR> Expected time:12:36 AM<BR> Means of arrival:Ambulance<BR> Comments:<BR> uti

## 2013-06-18 NOTE — ED Provider Notes (Signed)
History    CSN: [id number] Arrival date & time 06/18/13  4098  First MD Initiated Contact with Patient 06/18/13 0057     Chief Complaint  Patient presents with  . Urinary Tract Infection   (Consider location/radiation/quality/duration/timing/severity/associated sxs/prior Treatment) HPI 78 yo male presents to the ER from nursing facility with complaint of lower abd pain, concern for UTI.  Pt had his foley catheter changed earlier today.  He reports this evening when his bag was changed from leg bag to night time bag he developed pain, and has been unable to pass any urine.  No fevers, chills.  Has chronic indwelling foley.  Past Medical History  Diagnosis Date  . Hearing aid worn   . Hypertension   . Parkinson disease   . Pneumonia   . Diabetes mellitus type II   . Arthritis   . Bipolar disorder   . Anxiety   . Depression   . MRSA infection (methicillin-resistant Staphylococcus aureus)   . Chronic indwelling Foley catheter   . C2 cervical fracture     due to pt fall  . SIRS (systemic inflammatory response syndrome)   . Neuromuscular disorder   . Memory loss   . Benign prostate hyperplasia    Past Surgical History  Procedure Laterality Date  . Total hip arthroplasty    . Video bronchoscopy  12/16/2012    Procedure: VIDEO BRONCHOSCOPY;  Surgeon: Loreli Slot, MD;  Location: [facility] OR;  Service: Thoracic;  Laterality: Right;  . Video assisted thoracoscopy (vats)/empyema  12/16/2012    Procedure: VIDEO ASSISTED THORACOSCOPY (VATS)/EMPYEMA;  Surgeon: Loreli Slot, MD;  Location: [facility] OR;  Service: Thoracic;  Laterality: Right;   Family History  Problem Relation Age of Onset  . Depression Father   . Cancer Mother   . Heart disease Mother    History  Substance Use Topics  . Smoking status: Never Smoker   . Smokeless tobacco: Never Used  . Alcohol Use: No     Comment: occasional    Review of Systems  Unable to perform ROS: Dementia    Allergies   Cholestatin  Home Medications   Current Outpatient Rx  Name  Route  Sig  Dispense  Refill  . amLODipine (NORVASC) 5 MG tablet   Oral   Take 1 tablet (5 mg total) by mouth daily.         Marland Kitchen amoxicillin-clavulanate (AUGMENTIN) 875-125 MG per tablet   Oral   Take 1 tablet by mouth 2 (two) times daily.         Marland Kitchen aspirin 81 MG chewable tablet   Oral   Chew 81 mg by mouth daily.         . carbidopa-levodopa (SINEMET) 25-250 MG per tablet   Oral   Take 1 tablet by mouth 3 (three) times daily.          . Cranberry (AZO-CRANBERRY) 450 MG TABS   Oral   Take 1 tablet by mouth every morning.         . entacapone (COMTAN) 200 MG tablet   Oral   Take 200 mg by mouth 3 (three) times daily.          . fluticasone (FLONASE) 50 MCG/ACT nasal spray   Nasal   Place 2 sprays into the nose daily.         Marland Kitchen GLUCERNA (GLUCERNA) LIQD   Oral   Take 237 mLs by mouth 2 (two) times daily between meals.         Marland Kitchen  hydrALAZINE (APRESOLINE) 25 MG tablet   Oral   Take 1 tablet (25 mg total) by mouth every 8 (eight) hours.         Marland Kitchen lamoTRIgine (LAMICTAL) 150 MG tablet   Oral   Take 1 tablet (150 mg total) by mouth 2 (two) times daily.   180 tablet   0   . lisinopril (PRINIVIL,ZESTRIL) 10 MG tablet   Oral   Take 1 tablet (10 mg total) by mouth daily.         . phenazopyridine (PYRIDIUM) 100 MG tablet   Oral   Take 100 mg by mouth 3 (three) times daily.         . polyethylene glycol (MIRALAX / GLYCOLAX) packet   Oral   Take 17 g by mouth daily.         . QUEtiapine (SEROQUEL) 50 MG tablet   Oral   Take 1 tablet (50 mg total) by mouth at bedtime.   90 tablet   0   . Red Yeast Rice Extract (RED YEAST RICE PO)   Oral   Take 2 tablets by mouth at bedtime.           . sertraline (ZOLOFT) 50 MG tablet   Oral   Take 3 tablets (150 mg total) by mouth every morning.   90 tablet   0   . Valerian Root 500 MG CAPS   Oral   Take 1 capsule by mouth at bedtime.          Marland Kitchen acetaminophen (TYLENOL) 325 MG tablet   Oral   Take 2 tablets (650 mg total) by mouth every 6 (six) hours as needed for pain or fever.         . clonazePAM (KLONOPIN) 0.5 MG tablet   Oral   Take 0.5 mg by mouth 2 (two) times daily as needed. For anxiety         . guaiFENesin (MUCINEX) 600 MG 12 hr tablet   Oral   Take 1,200 mg by mouth 2 (two) times daily as needed for congestion.          . traZODone (DESYREL) 50 MG tablet   Oral   Take 1 tablet (50 mg total) by mouth at bedtime as needed. sleep   90 tablet   0    BP 196/88  Pulse 63  Temp(Src) 98.6 F (37 C) (Oral)  Resp 18  SpO2 97% Physical Exam  Constitutional: He appears distressed.  HENT:  Head: Normocephalic and atraumatic.  Neck: Normal range of motion. Neck supple. No JVD present. No tracheal deviation present. No thyromegaly present.  Cardiovascular: Normal rate, regular rhythm, normal heart sounds and intact distal pulses.  Exam reveals no gallop and no friction rub.   No murmur heard. Pulmonary/Chest: Effort normal and breath sounds normal. No stridor. No respiratory distress. He has no wheezes. He has no rales. He exhibits no tenderness.  Abdominal: He exhibits distension (suprapubic). There is tenderness.  Genitourinary:  Foley in place, no urine draining  Musculoskeletal: Normal range of motion. He exhibits no edema and no tenderness.  Lymphadenopathy:    He has no cervical adenopathy.  Skin: Skin is warm and dry. No rash noted. No erythema. No pallor.    ED Course  Procedures (including critical care time) Labs Reviewed  URINALYSIS, ROUTINE W REFLEX MICROSCOPIC - Abnormal; Notable for the following:    Hgb urine dipstick MODERATE (*)    Nitrite POSITIVE (*)    Leukocytes, UA  MODERATE (*)    All other components within normal limits  URINE MICROSCOPIC-ADD ON   No results found. 1. Obstructed Foley catheter, initial encounter   2. Urinary tract infection     MDM  78 yo male  with lower abdominal pain.  Concern that his foley is blocked, will have nursing staff flush, and if unable to flush will replace.  Pt noted to have UTI, currently on abx.  Will send for culture.  Olivia Mackie, MD 06/18/13 [phone]

## 2013-06-18 NOTE — ED Notes (Signed)
Per EMS report: pt from [name]: Pt is currently being treated for a UTI.  Pt had his foley bag changed 4-6 hours ago.  Since the change, pt has been experiencing pain and no urine output despite drinking multiple bottles of water. Pt a/o x 4.  Pt is hard of hearing and doesn't have hearing aids.  Skin warm and dry.

## 2013-06-18 NOTE — ED Notes (Signed)
[facility] called for transport.  

## 2013-07-09 ENCOUNTER — Emergency Department (HOSPITAL_COMMUNITY)
Admission: EM | Admit: 2013-07-09 | Discharge: 2013-07-09 | Disposition: A | Discharge status: Home / Self Care | Payer: Medicare Other | Attending: Emergency Medicine | Admitting: Emergency Medicine

## 2013-07-09 ENCOUNTER — Encounter (HOSPITAL_COMMUNITY): Payer: Self-pay | Admitting: Emergency Medicine

## 2013-07-09 DIAGNOSIS — F319 Bipolar disorder, unspecified: Secondary | ICD-10-CM | POA: Insufficient documentation

## 2013-07-09 DIAGNOSIS — F411 Generalized anxiety disorder: Secondary | ICD-10-CM | POA: Insufficient documentation

## 2013-07-09 DIAGNOSIS — Z8669 Personal history of other diseases of the nervous system and sense organs: Secondary | ICD-10-CM | POA: Insufficient documentation

## 2013-07-09 DIAGNOSIS — Z8614 Personal history of Methicillin resistant Staphylococcus aureus infection: Secondary | ICD-10-CM | POA: Insufficient documentation

## 2013-07-09 DIAGNOSIS — Z87448 Personal history of other diseases of urinary system: Secondary | ICD-10-CM | POA: Insufficient documentation

## 2013-07-09 DIAGNOSIS — Z7982 Long term (current) use of aspirin: Secondary | ICD-10-CM | POA: Insufficient documentation

## 2013-07-09 DIAGNOSIS — G20A1 Parkinson's disease without dyskinesia, without mention of fluctuations: Secondary | ICD-10-CM | POA: Insufficient documentation

## 2013-07-09 DIAGNOSIS — G20 Parkinson's disease: Secondary | ICD-10-CM | POA: Insufficient documentation

## 2013-07-09 DIAGNOSIS — M129 Arthropathy, unspecified: Secondary | ICD-10-CM | POA: Insufficient documentation

## 2013-07-09 DIAGNOSIS — R339 Retention of urine, unspecified: Secondary | ICD-10-CM

## 2013-07-09 DIAGNOSIS — Z8781 Personal history of (healed) traumatic fracture: Secondary | ICD-10-CM | POA: Insufficient documentation

## 2013-07-09 DIAGNOSIS — Z8619 Personal history of other infectious and parasitic diseases: Secondary | ICD-10-CM | POA: Insufficient documentation

## 2013-07-09 DIAGNOSIS — Z79899 Other long term (current) drug therapy: Secondary | ICD-10-CM | POA: Insufficient documentation

## 2013-07-09 DIAGNOSIS — Z8701 Personal history of pneumonia (recurrent): Secondary | ICD-10-CM | POA: Insufficient documentation

## 2013-07-09 DIAGNOSIS — Z9889 Other specified postprocedural states: Secondary | ICD-10-CM | POA: Insufficient documentation

## 2013-07-09 DIAGNOSIS — E119 Type 2 diabetes mellitus without complications: Secondary | ICD-10-CM | POA: Insufficient documentation

## 2013-07-09 DIAGNOSIS — I10 Essential (primary) hypertension: Secondary | ICD-10-CM | POA: Insufficient documentation

## 2013-07-09 DIAGNOSIS — N390 Urinary tract infection, site not specified: Secondary | ICD-10-CM | POA: Insufficient documentation

## 2013-07-09 DIAGNOSIS — N509 Disorder of male genital organs, unspecified: Secondary | ICD-10-CM | POA: Insufficient documentation

## 2013-07-09 LAB — URINALYSIS, ROUTINE W REFLEX MICROSCOPIC
Bilirubin Urine: NEGATIVE
Glucose, UA: NEGATIVE mg/dL
Nitrite: POSITIVE — AB
Specific Gravity, Urine: 1.013 (ref 1.005–1.030)
pH: 7.5 (ref 5.0–8.0)

## 2013-07-09 LAB — URINE MICROSCOPIC-ADD ON

## 2013-07-09 MED ORDER — CIPROFLOXACIN HCL 500 MG PO TABS: 500.0000 mg | ORAL_TABLET | Freq: Two times a day (BID) | ORAL | Status: DC

## 2013-07-09 NOTE — ED Notes (Signed)
MD at bedside at this time.

## 2013-07-09 NOTE — ED Provider Notes (Signed)
History    CSN: [id number] Arrival date & time 07/09/13  0046  First MD Initiated Contact with Patient 07/09/13 0123     Chief Complaint  Patient presents with  . Urinary Retention   (Consider location/radiation/quality/duration/timing/severity/associated sxs/prior Treatment) HPI History provided by pt and EMS.  Pt comes from ALF at [facility].  Per EMS, his chronic indwelling foley was changed by nursing staff at 9:30 yesterday evening.  Pt had simultaneous onset of pain in shaft of penis.  Pain aggravated by palpation of lower abdomen.  ED nursing staff reports that his 14 french foley was not draining.  When they exchanged it for a 16 french, the patient reported pain in penis both w/ removal and insertion.  No blood in urine and pt denies associated fever, vomiting, change in bowels, testicular pain and other urinary sx.  Per prior chart, pt seen in ED for urinary retention on 06/18/13, foley was flushed successfully, U/A positive for infection and pt d/c'd home w/ abx.  His urologist is Dr. Isabel Caprice.  Past Medical History  Diagnosis Date  . Hearing aid worn   . Hypertension   . Parkinson disease   . Pneumonia   . Diabetes mellitus type II   . Arthritis   . Bipolar disorder   . Anxiety   . Depression   . MRSA infection (methicillin-resistant Staphylococcus aureus)   . Chronic indwelling Foley catheter   . C2 cervical fracture     due to pt fall  . SIRS (systemic inflammatory response syndrome)   . Neuromuscular disorder   . Memory loss   . Benign prostate hyperplasia    Past Surgical History  Procedure Laterality Date  . Total hip arthroplasty    . Video bronchoscopy  12/16/2012    Procedure: VIDEO BRONCHOSCOPY;  Surgeon: Loreli Slot, MD;  Location: [facility] OR;  Service: Thoracic;  Laterality: Right;  . Video assisted thoracoscopy (vats)/empyema  12/16/2012    Procedure: VIDEO ASSISTED THORACOSCOPY (VATS)/EMPYEMA;  Surgeon: Loreli Slot, MD;  Location: [facility] OR;   Service: Thoracic;  Laterality: Right;   Family History  Problem Relation Age of Onset  . Depression Father   . Cancer Mother   . Heart disease Mother    History  Substance Use Topics  . Smoking status: Never Smoker   . Smokeless tobacco: Never Used  . Alcohol Use: No     Comment: occasional    Review of Systems  All other systems reviewed and are negative.    Allergies  Cholestatin  Home Medications   Current Outpatient Rx  Name  Route  Sig  Dispense  Refill  . amLODipine (NORVASC) 5 MG tablet   Oral   Take 1 tablet (5 mg total) by mouth daily.         Marland Kitchen aspirin 81 MG chewable tablet   Oral   Chew 81 mg by mouth daily.         . carbidopa-levodopa (SINEMET) 25-250 MG per tablet   Oral   Take 1 tablet by mouth 3 (three) times daily.          . clonazePAM (KLONOPIN) 0.5 MG tablet   Oral   Take 0.5 mg by mouth 2 (two) times daily as needed. For anxiety         . Cranberry (AZO-CRANBERRY) 450 MG TABS   Oral   Take 1 tablet by mouth every morning.         . entacapone (COMTAN) 200 MG  tablet   Oral   Take 200 mg by mouth 3 (three) times daily.          . fluticasone (FLONASE) 50 MCG/ACT nasal spray   Nasal   Place 2 sprays into the nose daily.         Marland Kitchen GLUCERNA (GLUCERNA) LIQD   Oral   Take 237 mLs by mouth 2 (two) times daily between meals.         Marland Kitchen guaiFENesin (MUCINEX) 600 MG 12 hr tablet   Oral   Take 1,200 mg by mouth 2 (two) times daily as needed for congestion.          . hydrALAZINE (APRESOLINE) 25 MG tablet   Oral   Take 1 tablet (25 mg total) by mouth every 8 (eight) hours.         Marland Kitchen lamoTRIgine (LAMICTAL) 150 MG tablet   Oral   Take 1 tablet (150 mg total) by mouth 2 (two) times daily.   180 tablet   0   . lisinopril (PRINIVIL,ZESTRIL) 10 MG tablet   Oral   Take 1 tablet (10 mg total) by mouth daily.         . Multiple Vitamins-Minerals (MULTIVITAMIN) tablet   Oral   Take 1 tablet by mouth daily.          . phenazopyridine (PYRIDIUM) 100 MG tablet   Oral   Take 100 mg by mouth 3 (three) times daily.         . polyethylene glycol (MIRALAX / GLYCOLAX) packet   Oral   Take 17 g by mouth daily.         . QUEtiapine (SEROQUEL) 50 MG tablet   Oral   Take 1 tablet (50 mg total) by mouth at bedtime.   90 tablet   0   . Red Yeast Rice Extract (RED YEAST RICE PO)   Oral   Take 2 tablets by mouth at bedtime.           . sertraline (ZOLOFT) 50 MG tablet   Oral   Take 3 tablets (150 mg total) by mouth every morning.   90 tablet   0   . Valerian Root 500 MG CAPS   Oral   Take 1 capsule by mouth at bedtime.         Marland Kitchen acetaminophen (TYLENOL) 325 MG tablet   Oral   Take 2 tablets (650 mg total) by mouth every 6 (six) hours as needed for pain or fever.         . traZODone (DESYREL) 50 MG tablet   Oral   Take 1 tablet (50 mg total) by mouth at bedtime as needed. sleep   90 tablet   0    BP 123/57  Pulse 51  Temp(Src) 97.9 F (36.6 C) (Oral)  Resp 18  Ht 5' 10.5" (1.791 m)  Wt 163 lb (73.936 kg)  BMI 23.05 kg/m2  SpO2 93% Physical Exam  Nursing note and vitals reviewed. Constitutional: He is oriented to person, place, and time. He appears well-developed and well-nourished. No distress.  HENT:  Head: Normocephalic and atraumatic.  Eyes:  Normal appearance  Neck: Normal range of motion.  Cardiovascular: Normal rate and regular rhythm.   Pulmonary/Chest: Effort normal and breath sounds normal. No respiratory distress.  Abdominal: Soft. Bowel sounds are normal. He exhibits no distension and no mass. There is no tenderness. There is no rebound and no guarding.  Palpation of lower abd induces mild pain  in shaft of penis.   Genitourinary:  Testicles descended bilaterally and both w/out mass or significant tenderness.  No tenderness of shaft of penis. No urethral discharge.  Foley in place and is draining.   Musculoskeletal: Normal range of motion.  Neurological: He is  alert and oriented to person, place, and time.  Skin: Skin is warm and dry. No rash noted.  Psychiatric: He has a normal mood and affect. His behavior is normal.    ED Course  Procedures (including critical care time) Labs Reviewed  URINALYSIS, ROUTINE W REFLEX MICROSCOPIC - Abnormal; Notable for the following:    Color, Urine ORANGE (*)    APPearance CLOUDY (*)    Hgb urine dipstick LARGE (*)    Nitrite POSITIVE (*)    Leukocytes, UA LARGE (*)    All other components within normal limits  URINE MICROSCOPIC-ADD ON - Abnormal; Notable for the following:    Bacteria, UA MANY (*)    All other components within normal limits  URINE CULTURE   No results found. 1. Urinary tract infection   2. Urinary retention     MDM  78yo M w/ chronic indwelling foley presented to ED w/ acute urinary retention and penis pain.  Pain started at 9:30 last night when nursing staff at ALF changed his foley.  ER nursing staff exchanged 14 french foley for 16 french and he is draining urine adequately.  Pt is afebrile, non-toxic appearing, abd benign, no significant genitalia findings.  Suspect that his penile pain is traumatic secondary to foley replacement.  Reports that pain is currently minimal.  U/A positive for infection.  Culture pending.  Prescribed cipro.  Recommended f/u w/ urologist if penile pain has not resolved in 2 days and return to ER for fever, severe abd pain or vomiting.  3:29 AM   Otilio Miu, PA-C 07/09/13 [phone]

## 2013-07-09 NOTE — ED Notes (Signed)
Gave report to Terri at [facility]

## 2013-07-09 NOTE — ED Notes (Signed)
Per EMS, pt from [facility], had foley changed at 2130. Since then has been experiencing pain to penis and urinary retention.  Pt is prone to UTIs.  Pt hypertensive at this time

## 2013-07-09 NOTE — ED Provider Notes (Signed)
Medical screening examination/treatment/procedure(s) were conducted as a shared visit with non-physician practitioner(s) and myself.  I personally evaluated the patient during the encounter.  Patient has long history of BPH and indwelling Foley catheter.   Catheter change. Good urinary outflow. Rx Cipro for potential UTI  Donnetta Hutching, MD 07/09/13 [phone]

## 2013-07-09 NOTE — ED Notes (Signed)
ZOX:[id number]<AV> Expected date:07/09/13<BR> Expected time:12:35 AM<BR> Means of arrival:Ambulance<BR> Comments:<BR> 78 yo M  Foley problems

## 2013-07-11 LAB — URINE CULTURE

## 2013-07-11 NOTE — ED Notes (Signed)
+   Urine Chart appended per protocol -

## 2013-07-13 ENCOUNTER — Telehealth (HOSPITAL_COMMUNITY): Payer: Self-pay | Admitting: Emergency Medicine

## 2013-07-13 NOTE — ED Notes (Signed)
Post ED Visit - Positive Culture Follow-up  Culture report reviewed by antimicrobial stewardship pharmacist: []  Wes Dulaney, Pharm.D., BCPS [x]  Celedonio Miyamoto, [address].D., BCPS []  Georgina Pillion, Pharm.D., BCPS []  [place], [place].D., BCPS, AAHIVP []  Estella Husk, Pharm.D., BCPS, AAHIVP  Positive urine culture Treated with Cipro, organism sensitive to the same and no further patient follow-up is required at this time.  Kylie A Holland 07/13/2013, 9:40 AM

## 2013-07-21 ENCOUNTER — Ambulatory Visit (HOSPITAL_COMMUNITY): Payer: Self-pay | Admitting: Psychiatry

## 2013-07-21 ENCOUNTER — Encounter (HOSPITAL_COMMUNITY): Payer: Self-pay

## 2013-07-22 ENCOUNTER — Encounter (HOSPITAL_COMMUNITY): Payer: Self-pay | Admitting: *Deleted

## 2013-07-22 ENCOUNTER — Emergency Department (HOSPITAL_COMMUNITY)
Admission: EM | Admit: 2013-07-22 | Discharge: 2013-07-23 | Disposition: A | Discharge status: Home / Self Care | Payer: Medicare Other | Attending: Emergency Medicine | Admitting: Emergency Medicine

## 2013-07-22 DIAGNOSIS — Z96649 Presence of unspecified artificial hip joint: Secondary | ICD-10-CM | POA: Insufficient documentation

## 2013-07-22 DIAGNOSIS — N390 Urinary tract infection, site not specified: Secondary | ICD-10-CM | POA: Insufficient documentation

## 2013-07-22 DIAGNOSIS — Z8781 Personal history of (healed) traumatic fracture: Secondary | ICD-10-CM | POA: Insufficient documentation

## 2013-07-22 DIAGNOSIS — Z87710 Personal history of (corrected) hypospadias: Secondary | ICD-10-CM | POA: Insufficient documentation

## 2013-07-22 DIAGNOSIS — R300 Dysuria: Secondary | ICD-10-CM | POA: Insufficient documentation

## 2013-07-22 DIAGNOSIS — Z79899 Other long term (current) drug therapy: Secondary | ICD-10-CM | POA: Insufficient documentation

## 2013-07-22 DIAGNOSIS — R339 Retention of urine, unspecified: Secondary | ICD-10-CM

## 2013-07-22 DIAGNOSIS — Z8701 Personal history of pneumonia (recurrent): Secondary | ICD-10-CM | POA: Insufficient documentation

## 2013-07-22 DIAGNOSIS — IMO0002 Reserved for concepts with insufficient information to code with codable children: Secondary | ICD-10-CM | POA: Insufficient documentation

## 2013-07-22 DIAGNOSIS — Z87448 Personal history of other diseases of urinary system: Secondary | ICD-10-CM | POA: Insufficient documentation

## 2013-07-22 DIAGNOSIS — F319 Bipolar disorder, unspecified: Secondary | ICD-10-CM | POA: Insufficient documentation

## 2013-07-22 DIAGNOSIS — Z9889 Other specified postprocedural states: Secondary | ICD-10-CM | POA: Insufficient documentation

## 2013-07-22 DIAGNOSIS — R109 Unspecified abdominal pain: Secondary | ICD-10-CM | POA: Insufficient documentation

## 2013-07-22 DIAGNOSIS — Z8614 Personal history of Methicillin resistant Staphylococcus aureus infection: Secondary | ICD-10-CM | POA: Insufficient documentation

## 2013-07-22 DIAGNOSIS — F411 Generalized anxiety disorder: Secondary | ICD-10-CM | POA: Insufficient documentation

## 2013-07-22 DIAGNOSIS — E119 Type 2 diabetes mellitus without complications: Secondary | ICD-10-CM | POA: Insufficient documentation

## 2013-07-22 DIAGNOSIS — M129 Arthropathy, unspecified: Secondary | ICD-10-CM | POA: Insufficient documentation

## 2013-07-22 DIAGNOSIS — Z8669 Personal history of other diseases of the nervous system and sense organs: Secondary | ICD-10-CM | POA: Insufficient documentation

## 2013-07-22 DIAGNOSIS — I10 Essential (primary) hypertension: Secondary | ICD-10-CM | POA: Insufficient documentation

## 2013-07-22 DIAGNOSIS — Z7982 Long term (current) use of aspirin: Secondary | ICD-10-CM | POA: Insufficient documentation

## 2013-07-22 LAB — BASIC METABOLIC PANEL
BUN: 32 mg/dL — ABNORMAL HIGH (ref 6–23)
CO2: 24 mEq/L (ref 19–32)
Chloride: 104 mEq/L (ref 96–112)
Creatinine, Ser: 1.07 mg/dL (ref 0.50–1.35)
Glucose, Bld: 107 mg/dL — ABNORMAL HIGH (ref 70–99)

## 2013-07-22 LAB — URINALYSIS, ROUTINE W REFLEX MICROSCOPIC
Bilirubin Urine: NEGATIVE
Glucose, UA: NEGATIVE mg/dL
Nitrite: NEGATIVE
Protein, ur: NEGATIVE mg/dL
Urobilinogen, UA: 0.2 mg/dL (ref 0.0–1.0)
pH: 6 (ref 5.0–8.0)

## 2013-07-22 LAB — URINE MICROSCOPIC-ADD ON

## 2013-07-22 LAB — CBC WITH DIFFERENTIAL/PLATELET
Basophils Relative: 0 % (ref 0–1)
HCT: 35.1 % — ABNORMAL LOW (ref 39.0–52.0)
Hemoglobin: 11.4 g/dL — ABNORMAL LOW (ref 13.0–17.0)
Lymphocytes Relative: 11 % — ABNORMAL LOW (ref 12–46)
MCHC: 32.5 g/dL (ref 30.0–36.0)
Monocytes Absolute: 0.6 10*3/uL (ref 0.1–1.0)
Monocytes Relative: 6 % (ref 3–12)
Neutro Abs: 8 10*3/uL — ABNORMAL HIGH (ref 1.7–7.7)

## 2013-07-22 MED ORDER — SULFAMETHOXAZOLE-TMP DS 800-160 MG PO TABS: 1.0000 | ORAL_TABLET | Freq: Once | ORAL | Status: DC

## 2013-07-22 MED ORDER — CIPROFLOXACIN HCL 500 MG PO TABS
500.0000 mg | ORAL_TABLET | Freq: Once | ORAL | Status: AC
  Administered 2013-07-22: 500 mg via ORAL
  Filled 2013-07-22: qty 1

## 2013-07-22 MED ORDER — CIPROFLOXACIN HCL 500 MG PO TABS: 500.0000 mg | ORAL_TABLET | Freq: Two times a day (BID) | ORAL | Status: DC

## 2013-07-22 NOTE — ED Notes (Signed)
JYN:[id number]<AO> Expected date:<BR> Expected time:<BR> Means of arrival:<BR> Comments:<BR> EMS 78 yo from SNF-had foley placed today-foley clogged with blood clots

## 2013-07-22 NOTE — ED Provider Notes (Signed)
CSN: [id number]     Arrival date & time 07/22/13  2147 History     First MD Initiated Contact with Patient 07/22/13 2154     Chief Complaint  Patient presents with  . Urinary Retention   (Consider location/radiation/quality/duration/timing/severity/associated sxs/prior Treatment) The history is provided by the patient.  Dwayne Leep Caponi Montez Hageman. is a 78 y.o. male hx of HTN, BPH here with urinary retention. He has indwelling Foley for BPH. The Foley catheter was changed by the hospice nurse earlier today. Subsequently got clogged and it was taken out. Similar thing happened a month ago and he had to come to the ER that time as well. He had suprapubic pain afterwards and is unable to urinate for several hours.    Past Medical History  Diagnosis Date  . Hearing aid worn   . Hypertension   . Parkinson disease   . Pneumonia   . Diabetes mellitus type II   . Arthritis   . Bipolar disorder   . Anxiety   . Depression   . MRSA infection (methicillin-resistant Staphylococcus aureus)   . Chronic indwelling Foley catheter   . C2 cervical fracture     due to pt fall  . SIRS (systemic inflammatory response syndrome)   . Neuromuscular disorder   . Memory loss   . Benign prostate hyperplasia    Past Surgical History  Procedure Laterality Date  . Total hip arthroplasty    . Video bronchoscopy  12/16/2012    Procedure: VIDEO BRONCHOSCOPY;  Surgeon: Loreli Slot, MD;  Location: [facility] OR;  Service: Thoracic;  Laterality: Right;  . Video assisted thoracoscopy (vats)/empyema  12/16/2012    Procedure: VIDEO ASSISTED THORACOSCOPY (VATS)/EMPYEMA;  Surgeon: Loreli Slot, MD;  Location: [facility] OR;  Service: Thoracic;  Laterality: Right;   Family History  Problem Relation Age of Onset  . Depression Father   . Cancer Mother   . Heart disease Mother    History  Substance Use Topics  . Smoking status: Never Smoker   . Smokeless tobacco: Never Used  . Alcohol Use: No     Comment: occasional     Review of Systems  Gastrointestinal: Positive for abdominal pain.  Genitourinary: Positive for difficulty urinating.  All other systems reviewed and are negative.    Allergies  Cholestatin  Home Medications   Current Outpatient Rx  Name  Route  Sig  Dispense  Refill  . acetaminophen (TYLENOL) 325 MG tablet   Oral   Take 2 tablets (650 mg total) by mouth every 6 (six) hours as needed for pain or fever.         Marland Kitchen amLODipine (NORVASC) 5 MG tablet   Oral   Take 1 tablet (5 mg total) by mouth daily.         Marland Kitchen aspirin 81 MG chewable tablet   Oral   Chew 81 mg by mouth daily.         . clonazePAM (KLONOPIN) 0.5 MG tablet   Oral   Take 0.5 mg by mouth 2 (two) times daily as needed. For anxiety         . Cranberry (AZO-CRANBERRY) 450 MG TABS   Oral   Take 1 tablet by mouth every morning.         . fluticasone (FLONASE) 50 MCG/ACT nasal spray   Nasal   Place 2 sprays into the nose daily.         Marland Kitchen GLUCERNA (GLUCERNA) LIQD   Oral  Take 237 mLs by mouth 2 (two) times daily between meals.         Marland Kitchen guaiFENesin (MUCINEX) 600 MG 12 hr tablet   Oral   Take 1,200 mg by mouth 2 (two) times daily as needed for congestion.          Marland Kitchen lisinopril (PRINIVIL,ZESTRIL) 10 MG tablet   Oral   Take 1 tablet (10 mg total) by mouth daily.         . phenazopyridine (PYRIDIUM) 100 MG tablet   Oral   Take 100 mg by mouth 3 (three) times daily.         . polyethylene glycol (MIRALAX / GLYCOLAX) packet   Oral   Take 17 g by mouth daily.         . QUEtiapine (SEROQUEL) 50 MG tablet   Oral   Take 1 tablet (50 mg total) by mouth at bedtime.   90 tablet   0   . Red Yeast Rice Extract (RED YEAST RICE PO)   Oral   Take 2 tablets by mouth at bedtime.           . traZODone (DESYREL) 50 MG tablet   Oral   Take 1 tablet (50 mg total) by mouth at bedtime as needed. sleep   90 tablet   0   . Valerian Root 500 MG CAPS   Oral   Take 1 capsule by mouth at  bedtime.          BP 152/71  Pulse 67  Temp(Src) 98.9 F (37.2 C) (Oral)  Resp 20  SpO2 94% Physical Exam  Nursing note and vitals reviewed. Constitutional: He is oriented to person, place, and time.  Chronically ill, tired, uncomfortable   HENT:  Head: Normocephalic.  Mouth/Throat: Oropharynx is clear and moist.  Eyes: Pupils are equal, round, and reactive to light.  Neck: Normal range of motion. Neck supple.  Cardiovascular: Normal rate.   Pulmonary/Chest: Effort normal.  Abdominal: Soft.  + tender suprapubic area, bladder distended. No CVAT   Musculoskeletal: Normal range of motion.  Neurological: He is alert and oriented to person, place, and time.  Skin: Skin is warm and dry.  Psychiatric: He has a normal mood and affect. His behavior is normal. Judgment and thought content normal.    ED Course   Procedures (including critical care time)  Labs Reviewed  CBC WITH DIFFERENTIAL - Abnormal; Notable for the following:    RBC 3.74 (*)    Hemoglobin 11.4 (*)    HCT 35.1 (*)    Neutrophils Relative % 79 (*)    Neutro Abs 8.0 (*)    Lymphocytes Relative 11 (*)    All other components within normal limits  BASIC METABOLIC PANEL - Abnormal; Notable for the following:    Glucose, Bld 107 (*)    BUN 32 (*)    GFR calc non Af Amer 65 (*)    GFR calc Af Amer 76 (*)    All other components within normal limits  URINALYSIS, ROUTINE W REFLEX MICROSCOPIC - Abnormal; Notable for the following:    Color, Urine AMBER (*)    APPearance CLOUDY (*)    Hgb urine dipstick SMALL (*)    Leukocytes, UA LARGE (*)    All other components within normal limits  URINE MICROSCOPIC-ADD ON - Abnormal; Notable for the following:    Bacteria, UA FEW (*)    All other components within normal limits  URINE CULTURE   No  results found. No diagnosis found.  MDM  Dwayne Leep Audia Montez Hageman. is a 78 y.o. male here with urinary retention. Foley placed by nursing with 400 cc output. Will check UA and  labs.   10:59 PM Cr nl. UA + leuks but I think he likely is colonized. He wants abx so will give a course of cipro again (sensitive to it last time). Foley draining well.   [name], MD 07/22/13 2300

## 2013-07-22 NOTE — ED Notes (Signed)
Pt is awake and alert, pt transported via [facility] from [facility]. EMS reports that pt foley was taken out prior to arrival. [facility] reports bleeding and foley was clotted. Foley catheter was taken out by hospice nurse.  Pt a/o x 4 and skin warm and dry.

## 2013-07-22 NOTE — ED Notes (Signed)
Sandwich given to Pt.  [facility] called for transport.

## 2013-07-25 LAB — URINE CULTURE: Colony Count: >=100000

## 2013-07-26 ENCOUNTER — Telehealth (HOSPITAL_COMMUNITY): Payer: Self-pay | Admitting: Emergency Medicine

## 2013-07-26 NOTE — ED Notes (Signed)
Post ED Visit - Positive Culture Follow-up: Successful Patient Follow-Up  Culture assessed and recommendations reviewed by: []  Wes Dulaney, Pharm.D., BCPS []  Celedonio Miyamoto, Pharm.D., BCPS []  Georgina Pillion, [address].D., BCPS []  [place], [address].D., BCPS, AAHIVP [x]  Estella Husk, Pharm.D., BCPS, AAHIVP  Positive urine culture  []  Patient discharged without antimicrobial prescription and treatment is now indicated [x]  Organism is resistant to prescribed ED discharge antimicrobial []  Patient with positive blood cultures  Changes discussed with ED provider: Marlon Pel PA-C New antibiotic prescription: Keflex 500 mg PO 4 times per day x 7 days    Kylie A Holland 07/26/2013, 4:57 PM

## 2013-07-26 NOTE — Progress Notes (Signed)
ED Antimicrobial Stewardship Positive Culture Follow Up   [name]. is an 78 y.o. male who presented to [facility] on 07/22/2013 with a chief complaint of  Chief Complaint  Patient presents with  . Urinary Retention    Recent Results (from the past 720 hour(s))  URINE CULTURE     Status: None   Collection Time    07/09/13  1:13 AM      Result Value Range Status   Specimen Description URINE, CATHETERIZED   Final   Special Requests NONE   Final   Culture  Setup Time 07/09/2013 10:43   Final   Colony Count >=100,000 COLONIES/ML   Final   Culture PROTEUS MIRABILIS   Final   Report Status 07/11/2013 FINAL   Final   Organism ID, Bacteria PROTEUS MIRABILIS   Final  URINE CULTURE     Status: None   Collection Time    07/22/13 10:07 PM      Result Value Range Status   Specimen Description URINE, CATHETERIZED   Final   Special Requests NONE   Final   Culture  Setup Time     Final   Value: 07/23/2013 04:09     Performed at [facility] Count     Final   Value: >=100,000 COLONIES/ML     Performed at [facility]   Culture     Final   Value: STAPHYLOCOCCUS AUREUS     Note: RIFAMPIN AND GENTAMICIN SHOULD NOT BE USED AS SINGLE DRUGS FOR TREATMENT OF STAPH INFECTIONS.     Performed at [facility]   Report Status 07/25/2013 FINAL   Final   Organism ID, Bacteria STAPHYLOCOCCUS AUREUS   Final    [x]  Treated with Cipro, organism resistant to prescribed antimicrobial   New antibiotic prescription: Keflex 500mg  PO 4 times per day x 7 days  ED Provider: Marlon Pel, Alroy Bailiff 07/26/2013, 4:53 PM Infectious Diseases Pharmacist Phone# [phone]

## 2013-07-27 NOTE — ED Notes (Signed)
Patient resident @ [facility]. Spoke with [place] met tech and notified of + urine culture and need for change in antibiotic. Result and order faxed to [facility] [phone].

## 2013-07-28 ENCOUNTER — Ambulatory Visit (HOSPITAL_COMMUNITY): Payer: Self-pay | Admitting: Psychiatry

## 2013-08-13 ENCOUNTER — Ambulatory Visit (HOSPITAL_COMMUNITY): Payer: Self-pay | Admitting: Psychiatry

## 2013-08-19 ENCOUNTER — Ambulatory Visit (INDEPENDENT_AMBULATORY_CARE_PROVIDER_SITE_OTHER): Payer: Medicare Other | Admitting: Psychiatry

## 2013-08-19 ENCOUNTER — Encounter (HOSPITAL_COMMUNITY): Payer: Self-pay | Admitting: Psychiatry

## 2013-08-19 VITALS — BP 140/71 | HR 54 | Ht 71.0 in | Wt 162.0 lb

## 2013-08-19 DIAGNOSIS — F09 Unspecified mental disorder due to known physiological condition: Secondary | ICD-10-CM

## 2013-08-19 DIAGNOSIS — F319 Bipolar disorder, unspecified: Secondary | ICD-10-CM

## 2013-08-19 NOTE — Progress Notes (Signed)
[name] Progress Note  [name] [id number] 78 y.o.  08/19/2013 1:04 PM  Chief Complaint:  I went to emergency room last month because of urinary problem.  I finish my antibiotic.        History of Present Illness:  Patient is 78 year old Caucasian divorced male who came for his appointment with his aide.  Patient went emergency room last month because of urinary problem.  He has a good attention.  He was given antibiotic.  Patient has multiple episodes of UTI and past few months.  He also has episodes of fall but lately he is doing very well.  Patient is compliant with his psychiatric medication.  He denies any agitation anger or mood swing.  He is sleeping better.  He has some stress living at [facility].  He admitted issues with another client usually at dinnertime but he denies any aggression or any violence.  He is able to calm himself.  He likes to continue his current psychiatric medication.  He is taking medication for his Parkinson from Dr. Lesia Sago.  Now is taking Klonopin every day to help his anxiety.  He is also taking trazodone at night time.  He denies any rash or itching.  He likes his Lamictal.  He continues to have memory issues and most of the time he does not remember his medication and their doses.  He takes Klonopin, Zoloft, Lamictal.  Suicidal Ideation: No Plan Formed: No Patient has means to carry out plan: No  Homicidal Ideation: No Plan Formed: No Patient has means to carry out plan: No  Review of Systems  HENT: Positive for hearing loss.   Cardiovascular: Negative for chest pain and palpitations.  Musculoskeletal: Negative for back pain, joint pain and falls.  Neurological: Positive for weakness and headaches. Negative for seizures and loss of consciousness.  Psychiatric/Behavioral: Positive for memory loss. Negative for depression, suicidal ideas, hallucinations and substance abuse. The patient is nervous/anxious.    Past  psychiatric history Patient has significant history of bipolar disorder.  He has multiple psychiatric admission due to decompensation and noncompliance of medication.  Patient has history of aggression and impulsive behavior.  His last psychiatric admission was in 2005.  Medical history Patient has history of diabetes mellitus, hypertension, Parkinson disease, gait instability, hitting impairment and cognitive impairment.  He was recently admitted at least twice for repeated UTI.    Family and Social History: Patient lives by herself.  He's been divorced for few years.  He has no children.  He lives in an assisted-living facility and he has a home health aid [phone].  He use to go [facility] but lately he has been not going to these groups due to physical illness.  Outpatient Encounter Prescriptions as of 08/19/2013  Medication Sig Dispense Refill  . acetaminophen (TYLENOL) 325 MG tablet Take 2 tablets (650 mg total) by mouth every 6 (six) hours as needed for pain or fever.      Marland Kitchen amLODipine (NORVASC) 5 MG tablet Take 1 tablet (5 mg total) by mouth daily.      Marland Kitchen aspirin 81 MG chewable tablet Chew 81 mg by mouth daily.      . carbidopa-levodopa (SINEMET IR) 25-250 MG per tablet Take 1 tablet by mouth 3 (three) times daily.      . clonazePAM (KLONOPIN) 0.5 MG tablet Take 0.5 mg by mouth 2 (two) times daily as needed. For anxiety      . Cranberry (AZO-CRANBERRY)  450 MG TABS Take 1 tablet by mouth every morning.      . entacapone (COMTAN) 200 MG tablet Take 200 mg by mouth 3 (three) times daily.      . fluticasone (FLONASE) 50 MCG/ACT nasal spray Place 2 sprays into the nose daily.      Marland Kitchen GLUCERNA (GLUCERNA) LIQD Take 237 mLs by mouth 2 (two) times daily between meals.      . hydrALAZINE (APRESOLINE) 25 MG tablet Take 25 mg by mouth 3 (three) times daily.      Marland Kitchen lamoTRIgine (LAMICTAL) 150 MG tablet Take 150 mg by mouth 2 (two) times daily.      Marland Kitchen lisinopril (PRINIVIL,ZESTRIL) 10 MG tablet Take 1  tablet (10 mg total) by mouth daily.      . phenazopyridine (PYRIDIUM) 100 MG tablet Take 100 mg by mouth 3 (three) times daily.      . polyethylene glycol (MIRALAX / GLYCOLAX) packet Take 17 g by mouth daily.      . QUEtiapine (SEROQUEL) 50 MG tablet Take 1 tablet (50 mg total) by mouth at bedtime.  90 tablet  0  . Red Yeast Rice Extract (RED YEAST RICE PO) Take 2 tablets by mouth at bedtime.        . traZODone (DESYREL) 50 MG tablet Take 1 tablet (50 mg total) by mouth at bedtime as needed. sleep  90 tablet  0  . Valerian Root 500 MG CAPS Take 1 capsule by mouth at bedtime.      Marland Kitchen guaiFENesin (MUCINEX) 600 MG 12 hr tablet Take 1,200 mg by mouth 2 (two) times daily as needed for congestion.       . [DISCONTINUED] ciprofloxacin (CIPRO) 500 MG tablet Take 1 tablet (500 mg total) by mouth 2 (two) times daily. One po bid x 7 days  14 tablet  0   No facility-administered encounter medications on file as of 08/19/2013.   Recent Results (from the past 2160 hour(s))  URINALYSIS, ROUTINE W REFLEX MICROSCOPIC     Status: Abnormal   Collection Time    06/18/13  1:54 AM      Result Value Range   Color, Urine YELLOW  YELLOW   APPearance CLEAR  CLEAR   Specific Gravity, Urine 1.016  1.005 - 1.030   pH 6.0  5.0 - 8.0   Glucose, UA NEGATIVE  NEGATIVE mg/dL   Hgb urine dipstick MODERATE (*) NEGATIVE   Bilirubin Urine NEGATIVE  NEGATIVE   Ketones, ur NEGATIVE  NEGATIVE mg/dL   Protein, ur NEGATIVE  NEGATIVE mg/dL   Urobilinogen, UA 0.2  0.0 - 1.0 mg/dL   Nitrite POSITIVE (*) NEGATIVE   Leukocytes, UA MODERATE (*) NEGATIVE  URINE MICROSCOPIC-ADD ON     Status: None   Collection Time    06/18/13  1:54 AM      Result Value Range   WBC, UA 7-10  <3 WBC/hpf   Comment: WITH CLUMPS   RBC / HPF 21-50  <3 RBC/hpf   Bacteria, UA RARE  RARE  URINALYSIS, ROUTINE W REFLEX MICROSCOPIC     Status: Abnormal   Collection Time    07/09/13  1:13 AM      Result Value Range   Color, Urine ORANGE (*) YELLOW    Comment: BIOCHEMICALS MAY BE AFFECTED BY COLOR   APPearance CLOUDY (*) CLEAR   Specific Gravity, Urine 1.013  1.005 - 1.030   pH 7.5  5.0 - 8.0   Glucose, UA NEGATIVE  NEGATIVE mg/dL  Hgb urine dipstick LARGE (*) NEGATIVE   Bilirubin Urine NEGATIVE  NEGATIVE   Ketones, ur NEGATIVE  NEGATIVE mg/dL   Protein, ur NEGATIVE  NEGATIVE mg/dL   Urobilinogen, UA 0.2  0.0 - 1.0 mg/dL   Nitrite POSITIVE (*) NEGATIVE   Leukocytes, UA LARGE (*) NEGATIVE  URINE MICROSCOPIC-ADD ON     Status: Abnormal   Collection Time    07/09/13  1:13 AM      Result Value Range   WBC, UA 21-50  <3 WBC/hpf   RBC / HPF 3-6  <3 RBC/hpf   Bacteria, UA MANY (*) RARE  URINE CULTURE     Status: None   Collection Time    07/09/13  1:13 AM      Result Value Range   Specimen Description URINE, CATHETERIZED     Special Requests NONE     Culture  Setup Time 07/09/2013 10:43     Colony Count >=100,000 COLONIES/ML     Culture PROTEUS MIRABILIS     Report Status 07/11/2013 FINAL     Organism ID, Bacteria PROTEUS MIRABILIS    URINALYSIS, ROUTINE W REFLEX MICROSCOPIC     Status: Abnormal   Collection Time    07/22/13 10:07 PM      Result Value Range   Color, Urine AMBER (*) YELLOW   Comment: BIOCHEMICALS MAY BE AFFECTED BY COLOR   APPearance CLOUDY (*) CLEAR   Specific Gravity, Urine 1.018  1.005 - 1.030   pH 6.0  5.0 - 8.0   Glucose, UA NEGATIVE  NEGATIVE mg/dL   Hgb urine dipstick SMALL (*) NEGATIVE   Bilirubin Urine NEGATIVE  NEGATIVE   Ketones, ur NEGATIVE  NEGATIVE mg/dL   Protein, ur NEGATIVE  NEGATIVE mg/dL   Urobilinogen, UA 0.2  0.0 - 1.0 mg/dL   Nitrite NEGATIVE  NEGATIVE   Leukocytes, UA LARGE (*) NEGATIVE  URINE CULTURE     Status: None   Collection Time    07/22/13 10:07 PM      Result Value Range   Specimen Description URINE, CATHETERIZED     Special Requests NONE     Culture  Setup Time       Value: 07/23/2013 04:09     Performed at [facility] Count       Value: >=100,000  COLONIES/ML     Performed at [facility]   Culture       Value: STAPHYLOCOCCUS AUREUS     Note: RIFAMPIN AND GENTAMICIN SHOULD NOT BE USED AS SINGLE DRUGS FOR TREATMENT OF STAPH INFECTIONS.     Performed at [facility]   Report Status 07/25/2013 FINAL     Organism ID, Bacteria STAPHYLOCOCCUS AUREUS    URINE MICROSCOPIC-ADD ON     Status: Abnormal   Collection Time    07/22/13 10:07 PM      Result Value Range   WBC, UA TOO NUMEROUS TO COUNT  <3 WBC/hpf   RBC / HPF 3-6  <3 RBC/hpf   Bacteria, UA FEW (*) RARE  CBC WITH DIFFERENTIAL     Status: Abnormal   Collection Time    07/22/13 10:15 PM      Result Value Range   WBC 10.1  4.0 - 10.5 K/uL   RBC 3.74 (*) 4.22 - 5.81 MIL/uL   Hemoglobin 11.4 (*) 13.0 - 17.0 g/dL   HCT 13.0 (*) 86.5 - 78.4 %   MCV 93.9  78.0 - 100.0 fL   MCH  30.5  26.0 - 34.0 pg   MCHC 32.5  30.0 - 36.0 g/dL   RDW 84.6  96.2 - 95.2 %   Platelets 231  150 - 400 K/uL   Neutrophils Relative % 79 (*) 43 - 77 %   Neutro Abs 8.0 (*) 1.7 - 7.7 K/uL   Lymphocytes Relative 11 (*) 12 - 46 %   Lymphs Abs 1.1  0.7 - 4.0 K/uL   Monocytes Relative 6  3 - 12 %   Monocytes Absolute 0.6  0.1 - 1.0 K/uL   Eosinophils Relative 3  0 - 5 %   Eosinophils Absolute 0.3  0.0 - 0.7 K/uL   Basophils Relative 0  0 - 1 %   Basophils Absolute 0.0  0.0 - 0.1 K/uL  BASIC METABOLIC PANEL     Status: Abnormal   Collection Time    07/22/13 10:15 PM      Result Value Range   Sodium 136  135 - 145 mEq/L   Potassium 4.3  3.5 - 5.1 mEq/L   Chloride 104  96 - 112 mEq/L   CO2 24  19 - 32 mEq/L   Glucose, Bld 107 (*) 70 - 99 mg/dL   BUN 32 (*) 6 - 23 mg/dL   Creatinine, Ser 8.41  0.50 - 1.35 mg/dL   Calcium 8.7  8.4 - 32.4 mg/dL   GFR calc non Af Amer 65 (*) >90 mL/min   GFR calc Af Amer 76 (*) >90 mL/min   Comment:            The eGFR has been calculated     using the CKD EPI equation.     This calculation has not been     validated in all clinical     situations.      eGFR's persistently     <90 mL/min signify     possible Chronic Kidney Disease.    Past Psychiatric History/Hospitalization(s): Anxiety: Yes Bipolar Disorder: Yes Depression: No Mania: No Psychosis: No Schizophrenia: No Personality Disorder: No Hospitalization for psychiatric illness: Yes History of Electroconvulsive Shock Therapy: No Prior Suicide Attempts: No  Physical Exam: Constitutional:  BP 140/71  Pulse 54  Ht 5\' 11"  (1.803 m)  Wt 162 lb (73.483 kg)  BMI 22.6 kg/m2  General Appearance: Patient is pleasant but confused.  He is fairly groomed.  Musculoskeletal: Strength & Muscle Tone: decreased and atrophy Gait & Station: unsteady Patient leans: Front  Mental status examination Patient is casually dressed and fairly groomed.  He is pleasant and cooperative but he has difficulty remembering things.  He is confused at times.  He require reassurance from her aide to answer question.  His his speech is soft and his thought processes tangential.  He denies any active or passive suicidal thoughts or homicidal thoughts.  He denies any auditory or visual hallucination.  He has difficulty remembering things.  His attention and concentration is poor.  He described his mood is anxious and his affect is mood appropriate.  His immediate recall is good there were no delusion obsession present at this time.  He has fine tremors and has difficulty walking.  He need walker .  His gait is unsteady.  He's alert and oriented x3.  His insight judgment and impulse control is fair.  Medical Decision Making (Choose Three): Established Problem, Stable/Improving (1), Review of Psycho-Social Stressors (1), Review or order clinical lab tests (1), Decision to obtain old records (1), Review of Last Therapy Session (1)  and Review of New Medication or Change in Dosage (2)  Assessment: Axis I: Bipolar disorder, cognitive disorder NOS  Axis II: Deferred  Axis III: See medical history  Axis IV:  Moderate  Axis V: 50-55   Plan:  I reviewed recent blood work when he was in the emergency room.  Patient is compliant with his psychiatric medication which are Klonopin 0.5 mg at bedtime, Lamictal 150 mg twice a day, Zoloft 50 mg daily, trazodone 50 mg at bedtime and Seroquel 50 mg at bedtime.   At this time we will not change any medication since he is doing better.  I reviewed notes from Dr. Lesia Sago who is managing his Parkinson's disease.  I will continue trazodone Zoloft and Lamictal and Seroquel.  The patient is getting Klonopin from his primary care physician.  Recommend to call us back if he is a question of concern to her worsening of the septum.  Time spent 25 minutes.  More than 50% of the time spent and psychoeducation, counseling and coordination of care.  I will see him again in 3 months  Kerin Kren T., MD 08/19/2013

## 2013-09-30 ENCOUNTER — Encounter (INDEPENDENT_AMBULATORY_CARE_PROVIDER_SITE_OTHER): Payer: Self-pay

## 2013-09-30 ENCOUNTER — Ambulatory Visit (INDEPENDENT_AMBULATORY_CARE_PROVIDER_SITE_OTHER): Payer: Medicare Other | Admitting: Psychiatry

## 2013-09-30 ENCOUNTER — Encounter (HOSPITAL_COMMUNITY): Payer: Self-pay | Admitting: Psychiatry

## 2013-09-30 VITALS — BP 110/60 | HR 60 | Ht 69.84 in | Wt 177.4 lb

## 2013-09-30 DIAGNOSIS — F319 Bipolar disorder, unspecified: Secondary | ICD-10-CM

## 2013-09-30 DIAGNOSIS — F09 Unspecified mental disorder due to known physiological condition: Secondary | ICD-10-CM

## 2013-09-30 MED ORDER — SERTRALINE HCL 100 MG PO TABS: ORAL_TABLET | ORAL | Status: DC

## 2013-09-30 MED ORDER — TRAZODONE HCL 50 MG PO TABS: 50.0000 mg | ORAL_TABLET | Freq: Every evening | ORAL | Status: DC | PRN

## 2013-09-30 MED ORDER — LAMOTRIGINE 150 MG PO TABS: 150.0000 mg | ORAL_TABLET | Freq: Two times a day (BID) | ORAL | Status: DC

## 2013-09-30 MED ORDER — QUETIAPINE FUMARATE 50 MG PO TABS: 50.0000 mg | ORAL_TABLET | Freq: Every day | ORAL | Status: DC

## 2013-09-30 NOTE — Progress Notes (Signed)
[facility] [id number] Progress Note  [name] [id number] 78 y.o.  09/30/2013 10:53 AM  Chief Complaint:  I am doing better on the medication.  Sometime I cannot sleep at bedtime.        History of Present Illness:  Patient is 78 year old Caucasian divorced male who came for his appointment.  The patient usually comes with his aide but today patient told he has cut down the hours of his aide.  Patient told it was expensive and he could not afford full-time aide.  Overall patient is doing better.  He has memory impairment and he is a poor historian but he brought his list of medication .  Now he is taking Zoloft 200 mg daily.  Patient is unclear who is his primary care physician , as per records this physician is Dr. Audrea Muscat the patient claimed that he has not seen him for a long time.  Patient endorsed sleeping better.  He denies any recent visits to the emergency room.  He is not taking any antibiotic.   He claimed his urinary symptoms is also improved. He is taking trazodone but he does require Klonopin at 4 a.m. for longer sleep.  Patient denies recent paranoia, hallucination, crying spells, agitation or any anger episode.  He has stress [facility] because of expense but he has no other choice.  He is taking Seroquel, Zoloft, Lamictal and trazodone.  He does require Klonopin at 4 AM in the morning.  There has no complain of sedation, falls, dizziness.  Patient is not drinking or using any illegal substance.  He has fine tremors.  He is compliant with his Parkinson medication.  Patient sees Dr. Lesia Sago.  He denies any itching, rash or any other concerns with Lamictal.  Suicidal Ideation: No Plan Formed: No Patient has means to carry out plan: No  Homicidal Ideation: No Plan Formed: No Patient has means to carry out plan: No  Review of Systems  HENT: Positive for hearing loss.   Cardiovascular: Negative for chest pain and palpitations.   Musculoskeletal: Negative for back pain, falls and joint pain.  Neurological: Negative for seizures and loss of consciousness.  Psychiatric/Behavioral: Positive for memory loss. Negative for depression, suicidal ideas, hallucinations and substance abuse. The patient is nervous/anxious.    Past psychiatric history Patient has significant history of bipolar disorder.  He has multiple psychiatric admission due to decompensation and noncompliance of medication.  Patient has history of aggression and impulsive behavior.  His last psychiatric admission was in 2005.  Medical history Patient has history of diabetes mellitus, hypertension, Parkinson disease, gait instability, hitting impairment and cognitive impairment.  He was recently admitted at least twice for repeated UTI.    Family and Social History: Patient lives by herself.  He's been divorced for few years.  He has no children.  He lives in an assisted-living facility and he has a home health aid [phone].  He use to go [facility] but lately he has been not going to these groups due to physical illness.  Outpatient Encounter Prescriptions as of 09/30/2013  Medication Sig Dispense Refill  . carbidopa-levodopa (SINEMET IR) 25-250 MG per tablet Take 1 tablet by mouth 3 (three) times daily.      . clonazePAM (KLONOPIN) 0.5 MG tablet Take 0.5 mg by mouth 2 (two) times daily as needed. For anxiety      . lamoTRIgine (LAMICTAL) 150 MG tablet Take 1 tablet (150 mg total) by mouth  2 (two) times daily.  180 tablet  0  . sertraline (ZOLOFT) 100 MG tablet Take 2 tab daily  180 tablet  0  . traZODone (DESYREL) 50 MG tablet Take 1 tablet (50 mg total) by mouth at bedtime as needed. sleep  90 tablet  0  . [DISCONTINUED] lamoTRIgine (LAMICTAL) 150 MG tablet Take 150 mg by mouth 2 (two) times daily.      . [DISCONTINUED] sertraline (ZOLOFT) 50 MG tablet Take 50 mg by mouth 3 (three) times daily.      . [DISCONTINUED] traZODone (DESYREL) 50 MG tablet Take 1  tablet (50 mg total) by mouth at bedtime as needed. sleep  90 tablet  0  . acetaminophen (TYLENOL) 325 MG tablet Take 2 tablets (650 mg total) by mouth every 6 (six) hours as needed for pain or fever.      Marland Kitchen amLODipine (NORVASC) 5 MG tablet Take 1 tablet (5 mg total) by mouth daily.      Marland Kitchen aspirin 81 MG chewable tablet Chew 81 mg by mouth daily.      . Cranberry (AZO-CRANBERRY) 450 MG TABS Take 1 tablet by mouth every morning.      . entacapone (COMTAN) 200 MG tablet Take 200 mg by mouth 3 (three) times daily.      . fluticasone (FLONASE) 50 MCG/ACT nasal spray Place 2 sprays into the nose daily.      Marland Kitchen GLUCERNA (GLUCERNA) LIQD Take 237 mLs by mouth 2 (two) times daily between meals.      Marland Kitchen guaiFENesin (MUCINEX) 600 MG 12 hr tablet Take 1,200 mg by mouth 2 (two) times daily as needed for congestion.       . hydrALAZINE (APRESOLINE) 25 MG tablet Take 25 mg by mouth 3 (three) times daily.      Marland Kitchen lisinopril (PRINIVIL,ZESTRIL) 10 MG tablet Take 1 tablet (10 mg total) by mouth daily.      . phenazopyridine (PYRIDIUM) 100 MG tablet Take 100 mg by mouth 3 (three) times daily.      . polyethylene glycol (MIRALAX / GLYCOLAX) packet Take 17 g by mouth daily.      . QUEtiapine (SEROQUEL) 50 MG tablet Take 1 tablet (50 mg total) by mouth at bedtime.  90 tablet  0  . Red Yeast Rice Extract (RED YEAST RICE PO) Take 2 tablets by mouth at bedtime.        Gerarda Fraction Root 500 MG CAPS Take 1 capsule by mouth at bedtime.      . [DISCONTINUED] QUEtiapine (SEROQUEL) 50 MG tablet Take 1 tablet (50 mg total) by mouth at bedtime.  90 tablet  0   No facility-administered encounter medications on file as of 09/30/2013.   Recent Results (from the past 2160 hour(s))  URINALYSIS, ROUTINE W REFLEX MICROSCOPIC     Status: Abnormal   Collection Time    07/09/13  1:13 AM      Result Value Range   Color, Urine ORANGE (*) YELLOW   Comment: BIOCHEMICALS MAY BE AFFECTED BY COLOR   APPearance CLOUDY (*) CLEAR   Specific  Gravity, Urine 1.013  1.005 - 1.030   pH 7.5  5.0 - 8.0   Glucose, UA NEGATIVE  NEGATIVE mg/dL   Hgb urine dipstick LARGE (*) NEGATIVE   Bilirubin Urine NEGATIVE  NEGATIVE   Ketones, ur NEGATIVE  NEGATIVE mg/dL   Protein, ur NEGATIVE  NEGATIVE mg/dL   Urobilinogen, UA 0.2  0.0 - 1.0 mg/dL   Nitrite POSITIVE (*) NEGATIVE  Leukocytes, UA LARGE (*) NEGATIVE  URINE MICROSCOPIC-ADD ON     Status: Abnormal   Collection Time    07/09/13  1:13 AM      Result Value Range   WBC, UA 21-50  <3 WBC/hpf   RBC / HPF 3-6  <3 RBC/hpf   Bacteria, UA MANY (*) RARE  URINE CULTURE     Status: None   Collection Time    07/09/13  1:13 AM      Result Value Range   Specimen Description URINE, CATHETERIZED     Special Requests NONE     Culture  Setup Time 07/09/2013 10:43     Colony Count >=100,000 COLONIES/ML     Culture PROTEUS MIRABILIS     Report Status 07/11/2013 FINAL     Organism ID, Bacteria PROTEUS MIRABILIS    URINALYSIS, ROUTINE W REFLEX MICROSCOPIC     Status: Abnormal   Collection Time    07/22/13 10:07 PM      Result Value Range   Color, Urine AMBER (*) YELLOW   Comment: BIOCHEMICALS MAY BE AFFECTED BY COLOR   APPearance CLOUDY (*) CLEAR   Specific Gravity, Urine 1.018  1.005 - 1.030   pH 6.0  5.0 - 8.0   Glucose, UA NEGATIVE  NEGATIVE mg/dL   Hgb urine dipstick SMALL (*) NEGATIVE   Bilirubin Urine NEGATIVE  NEGATIVE   Ketones, ur NEGATIVE  NEGATIVE mg/dL   Protein, ur NEGATIVE  NEGATIVE mg/dL   Urobilinogen, UA 0.2  0.0 - 1.0 mg/dL   Nitrite NEGATIVE  NEGATIVE   Leukocytes, UA LARGE (*) NEGATIVE  URINE CULTURE     Status: None   Collection Time    07/22/13 10:07 PM      Result Value Range   Specimen Description URINE, CATHETERIZED     Special Requests NONE     Culture  Setup Time       Value: 07/23/2013 04:09     Performed at [facility] Count       Value: >=100,000 COLONIES/ML     Performed at [facility]   Culture       Value:  STAPHYLOCOCCUS AUREUS     Note: RIFAMPIN AND GENTAMICIN SHOULD NOT BE USED AS SINGLE DRUGS FOR TREATMENT OF STAPH INFECTIONS.     Performed at [facility]   Report Status 07/25/2013 FINAL     Organism ID, Bacteria STAPHYLOCOCCUS AUREUS    URINE MICROSCOPIC-ADD ON     Status: Abnormal   Collection Time    07/22/13 10:07 PM      Result Value Range   WBC, UA TOO NUMEROUS TO COUNT  <3 WBC/hpf   RBC / HPF 3-6  <3 RBC/hpf   Bacteria, UA FEW (*) RARE  CBC WITH DIFFERENTIAL     Status: Abnormal   Collection Time    07/22/13 10:15 PM      Result Value Range   WBC 10.1  4.0 - 10.5 K/uL   RBC 3.74 (*) 4.22 - 5.81 MIL/uL   Hemoglobin 11.4 (*) 13.0 - 17.0 g/dL   HCT 16.1 (*) 09.6 - 04.5 %   MCV 93.9  78.0 - 100.0 fL   MCH 30.5  26.0 - 34.0 pg   MCHC 32.5  30.0 - 36.0 g/dL   RDW 40.9  81.1 - 91.4 %   Platelets 231  150 - 400 K/uL   Neutrophils Relative % 79 (*) 43 - 77 %   Neutro  Abs 8.0 (*) 1.7 - 7.7 K/uL   Lymphocytes Relative 11 (*) 12 - 46 %   Lymphs Abs 1.1  0.7 - 4.0 K/uL   Monocytes Relative 6  3 - 12 %   Monocytes Absolute 0.6  0.1 - 1.0 K/uL   Eosinophils Relative 3  0 - 5 %   Eosinophils Absolute 0.3  0.0 - 0.7 K/uL   Basophils Relative 0  0 - 1 %   Basophils Absolute 0.0  0.0 - 0.1 K/uL  BASIC METABOLIC PANEL     Status: Abnormal   Collection Time    07/22/13 10:15 PM      Result Value Range   Sodium 136  135 - 145 mEq/L   Potassium 4.3  3.5 - 5.1 mEq/L   Chloride 104  96 - 112 mEq/L   CO2 24  19 - 32 mEq/L   Glucose, Bld 107 (*) 70 - 99 mg/dL   BUN 32 (*) 6 - 23 mg/dL   Creatinine, Ser 1.61  0.50 - 1.35 mg/dL   Calcium 8.7  8.4 - 09.6 mg/dL   GFR calc non Af Amer 65 (*) >90 mL/min   GFR calc Af Amer 76 (*) >90 mL/min   Comment:            The eGFR has been calculated     using the CKD EPI equation.     This calculation has not been     validated in all clinical     situations.     eGFR's persistently     <90 mL/min signify     possible Chronic Kidney  Disease.    Past Psychiatric History/Hospitalization(s): Anxiety: Yes Bipolar Disorder: Yes Depression: No Mania: No Psychosis: No Schizophrenia: No Personality Disorder: No Hospitalization for psychiatric illness: Yes History of Electroconvulsive Shock Therapy: No Prior Suicide Attempts: No  Physical Exam: Constitutional:  BP 110/60  Pulse 60  Ht 5' 9.84" (1.774 m)  Wt 177 lb 6.4 oz (80.468 kg)  BMI 25.57 kg/m2  General Appearance: Patient is pleasant but confused.  He is fairly groomed.  Musculoskeletal: Strength & Muscle Tone: decreased and atrophy Gait & Station: unsteady Patient leans: Front  Mental status examination Patient is casually dressed and fairly groomed.  He is pleasant and cooperative but he has difficulty remembering things.  He is confused at times.  His speech is soft and his thought processes are circumstantial.  He denies any active or passive suicidal thoughts or homicidal thoughts.  He denies any auditory or visual hallucination.  His attention and concentration is poor.  He described his mood is anxious and his affect is mood appropriate.  There were no delusions , paranoia or obsession present at this time.  He has fine tremors and has difficulty walking.  He need walker .  His gait is unsteady.  He's alert and oriented x3.  His insight judgment and impulse control is fair.  Medical Decision Making (Choose Three): Established Problem, Stable/Improving (1), Review of Psycho-Social Stressors (1), Review or order clinical lab tests (1), Decision to obtain old records (1), Review of Last Therapy Session (1) and Review of New Medication or Change in Dosage (2)  Assessment: Axis I: Bipolar disorder, cognitive disorder NOS  Axis II: Deferred  Axis III: See medical history  Axis IV: Moderate  Axis V: 50-55   Plan:  I am not sure will increase his Zoloft to 200 mg but patient is feeling better and tolerating the medication without any  side effects.   For now I will continue current medication however recommend to call us back if he started to feel dizzy or any other side effects.  I will continue Lamictal 150 mg twice a day, Zoloft 200 mg daily, trazodone 50 mg at bedtime and Seroquel 50 mg at bedtime.  Patient is given prescription of these medications for 90 day supply.  He is getting Klonopin from his primary care physician.  Recommend to call us back if he is a question of concern to her worsening of the septum.  Time spent 25 minutes.  More than 50% of the time spent and psychoeducation, counseling and coordination of care.  I will see him again in 3 months  Eliav Mechling T., MD 09/30/2013

## 2013-10-07 ENCOUNTER — Ambulatory Visit (HOSPITAL_COMMUNITY): Payer: Self-pay | Admitting: Psychiatry

## 2013-10-10 ENCOUNTER — Telehealth (HOSPITAL_COMMUNITY): Payer: Self-pay

## 2013-10-17 ENCOUNTER — Emergency Department (HOSPITAL_COMMUNITY)
Admission: EM | Admit: 2013-10-17 | Discharge: 2013-10-17 | Disposition: A | Discharge status: Home / Self Care | Payer: Medicare Other | Attending: Emergency Medicine | Admitting: Emergency Medicine

## 2013-10-17 ENCOUNTER — Encounter (HOSPITAL_COMMUNITY): Payer: Self-pay | Admitting: Emergency Medicine

## 2013-10-17 DIAGNOSIS — Z9889 Other specified postprocedural states: Secondary | ICD-10-CM | POA: Insufficient documentation

## 2013-10-17 DIAGNOSIS — F411 Generalized anxiety disorder: Secondary | ICD-10-CM | POA: Insufficient documentation

## 2013-10-17 DIAGNOSIS — IMO0002 Reserved for concepts with insufficient information to code with codable children: Secondary | ICD-10-CM | POA: Insufficient documentation

## 2013-10-17 DIAGNOSIS — G20A1 Parkinson's disease without dyskinesia, without mention of fluctuations: Secondary | ICD-10-CM | POA: Insufficient documentation

## 2013-10-17 DIAGNOSIS — Z8669 Personal history of other diseases of the nervous system and sense organs: Secondary | ICD-10-CM | POA: Insufficient documentation

## 2013-10-17 DIAGNOSIS — Z87448 Personal history of other diseases of urinary system: Secondary | ICD-10-CM | POA: Insufficient documentation

## 2013-10-17 DIAGNOSIS — Z8781 Personal history of (healed) traumatic fracture: Secondary | ICD-10-CM | POA: Insufficient documentation

## 2013-10-17 DIAGNOSIS — G20 Parkinson's disease: Secondary | ICD-10-CM | POA: Insufficient documentation

## 2013-10-17 DIAGNOSIS — Z79899 Other long term (current) drug therapy: Secondary | ICD-10-CM | POA: Insufficient documentation

## 2013-10-17 DIAGNOSIS — E119 Type 2 diabetes mellitus without complications: Secondary | ICD-10-CM | POA: Insufficient documentation

## 2013-10-17 DIAGNOSIS — Z8701 Personal history of pneumonia (recurrent): Secondary | ICD-10-CM | POA: Insufficient documentation

## 2013-10-17 DIAGNOSIS — M129 Arthropathy, unspecified: Secondary | ICD-10-CM | POA: Insufficient documentation

## 2013-10-17 DIAGNOSIS — B029 Zoster without complications: Secondary | ICD-10-CM

## 2013-10-17 DIAGNOSIS — F319 Bipolar disorder, unspecified: Secondary | ICD-10-CM | POA: Insufficient documentation

## 2013-10-17 DIAGNOSIS — Z7982 Long term (current) use of aspirin: Secondary | ICD-10-CM | POA: Insufficient documentation

## 2013-10-17 DIAGNOSIS — Z8614 Personal history of Methicillin resistant Staphylococcus aureus infection: Secondary | ICD-10-CM | POA: Insufficient documentation

## 2013-10-17 DIAGNOSIS — I10 Essential (primary) hypertension: Secondary | ICD-10-CM | POA: Insufficient documentation

## 2013-10-17 NOTE — ED Notes (Signed)
[facility] notified about transport back to [facility]

## 2013-10-17 NOTE — ED Provider Notes (Signed)
CSN: [id number]     Arrival date & time 10/17/13  1328 History   First MD Initiated Contact with Patient 10/17/13 1350     Chief Complaint  Patient presents with  . Rash   (Consider location/radiation/quality/duration/timing/severity/associated sxs/prior Treatment) HPI  [name]. Is a 78 year old male who lives in assisted living with a past medical history of diabetes, bipolar disorder, Parkinson's who presents for evaluation of rash.  Patient states he developed a rash starting on his back and wraps around the front of his thigh.  He denies any itching or pain.  Patient saw his primary care physician at the [facility] he states he was diagnosed with shingles and put on "some kind of antibiotic and cream."  Patient denies any discharge from his wounds, pain, swelling.    Past Medical History  Diagnosis Date  . Hearing aid worn   . Hypertension   . Parkinson disease   . Pneumonia   . Diabetes mellitus type II   . Arthritis   . Bipolar disorder   . Anxiety   . Depression   . MRSA infection (methicillin-resistant Staphylococcus aureus)   . Chronic indwelling Foley catheter   . C2 cervical fracture     due to pt fall  . SIRS (systemic inflammatory response syndrome)   . Neuromuscular disorder   . Memory loss   . Benign prostate hyperplasia    Past Surgical History  Procedure Laterality Date  . Total hip arthroplasty    . Video bronchoscopy  12/16/2012    Procedure: VIDEO BRONCHOSCOPY;  Surgeon: Loreli Slot, MD;  Location[facility] OR;  Service: Thoracic;  Laterality: Right;  . Video assisted thoracoscopy (vats)/empyema  12/16/2012    Procedure: VIDEO ASSISTED THORACOSCOPY (VATS)/EMPYEMA;  Surgeon: Loreli Slot, MD;  Location: [facility] OR;  Service: Thoracic;  Laterality: Right;   Family History  Problem Relation Age of Onset  . Depression Father   . Cancer Mother   . Heart disease Mother    History  Substance Use Topics  . Smoking  status: Never Smoker   . Smokeless tobacco: Never Used  . Alcohol Use: No     Comment: occasional    Review of Systems Ten systems reviewed and are negative for acute change, except as noted in the HPI.   Allergies  Cholestatin  Home Medications   Current Outpatient Rx  Name  Route  Sig  Dispense  Refill  . acetaminophen (TYLENOL) 325 MG tablet   Oral   Take 2 tablets (650 mg total) by mouth every 6 (six) hours as needed for pain or fever.         Marland Kitchen amLODipine (NORVASC) 5 MG tablet   Oral   Take 1 tablet (5 mg total) by mouth daily.         Marland Kitchen aspirin 81 MG chewable tablet   Oral   Chew 81 mg by mouth daily.         . carbidopa-levodopa (SINEMET IR) 25-250 MG per tablet   Oral   Take 1 tablet by mouth 3 (three) times daily.         . clonazePAM (KLONOPIN) 0.5 MG tablet   Oral   Take 0.5 mg by mouth 2 (two) times daily as needed. For anxiety         . Cranberry (AZO-CRANBERRY) 450 MG TABS   Oral   Take 1 tablet by mouth every morning.         Marland Kitchen  entacapone (COMTAN) 200 MG tablet   Oral   Take 200 mg by mouth 3 (three) times daily.         . fluticasone (FLONASE) 50 MCG/ACT nasal spray   Nasal   Place 2 sprays into the nose daily.         Marland Kitchen GLUCERNA (GLUCERNA) LIQD   Oral   Take 237 mLs by mouth 2 (two) times daily between meals.         Marland Kitchen guaiFENesin (MUCINEX) 600 MG 12 hr tablet   Oral   Take 1,200 mg by mouth 2 (two) times daily as needed for congestion.          . hydrALAZINE (APRESOLINE) 25 MG tablet   Oral   Take 25 mg by mouth 3 (three) times daily.         Marland Kitchen lamoTRIgine (LAMICTAL) 150 MG tablet   Oral   Take 1 tablet (150 mg total) by mouth 2 (two) times daily.   180 tablet   0   . lisinopril (PRINIVIL,ZESTRIL) 10 MG tablet   Oral   Take 1 tablet (10 mg total) by mouth daily.         . phenazopyridine (PYRIDIUM) 100 MG tablet   Oral   Take 100 mg by mouth 3 (three) times daily.         . polyethylene glycol  (MIRALAX / GLYCOLAX) packet   Oral   Take 17 g by mouth daily.         . QUEtiapine (SEROQUEL) 50 MG tablet   Oral   Take 1 tablet (50 mg total) by mouth at bedtime.   90 tablet   0   . Red Yeast Rice Extract (RED YEAST RICE PO)   Oral   Take 2 tablets by mouth at bedtime.           . sertraline (ZOLOFT) 100 MG tablet      Take 2 tab daily   180 tablet   0   . traZODone (DESYREL) 50 MG tablet   Oral   Take 1 tablet (50 mg total) by mouth at bedtime as needed. sleep   90 tablet   0   . Valerian Root 500 MG CAPS   Oral   Take 1 capsule by mouth at bedtime.          BP 138/62  Pulse 62  Temp(Src) 98 F (36.7 C) (Oral)  Resp 16  SpO2 95% Physical Exam Physical Exam  Nursing note and vitals reviewed. Constitutional: He appears well-developed and well-nourished. No distress.  HENT:  Head: Normocephalic and atraumatic.  Eyes: Conjunctivae normal are normal. No scleral icterus.  Neck: Normal range of motion. Neck supple.  Cardiovascular: Normal rate, regular rhythm and normal heart sounds.   Pulmonary/Chest: Effort normal and breath sounds normal. No respiratory distress.  Abdominal: Soft. There is no tenderness.  Musculoskeletal: He exhibits no edema.  Neurological: He is alert.  Skin: Skin is warm and dry. He is not diaphoretic.  Purpuric, nonblanching, vesicular eruption on the right leg.  Does not cross midline.  L3-L4 Dermatomal distribution  Genitourinary: Normal male anatomy.  Circumcised penis.  Indwelling Foley catheter is in place. Psychiatric: His behavior is normal.    ED Course  Procedures (including critical care time) Labs Review Labs Reviewed - No data to display Imaging Review No results found.  EKG Interpretation   None       MDM   1. Zoster    Filed  Vitals:   10/17/13 1322  BP: 138/62  Pulse: 62  Temp: 98 F (36.7 C)  Resp: 16   Patient seen here visit with Dr. Juleen China. Patient option consistent with herpes zoster  infection.  I called the curettage to verify patient diagnosis.  He is currently taking Valtrex and Zovirax cream. She'll be discharged back to his facility.  He may continue on the medications he is currently taking.  Appears appropriate for discharge at this time.    Arthor Captain, PA-C 10/17/13 [phone]

## 2013-10-17 NOTE — ED Notes (Signed)
Bed: WA20 Expected date:  Expected time:  Means of arrival:  Comments: EMS-rash 

## 2013-10-17 NOTE — ED Notes (Signed)
Per EMS pt comes from [facility] c/o rash on right leg and spreading to right hip times one week.  Pt denies pain, itching to rash.  Pt states that the facility put ointment on rash and that caused it to burn.

## 2013-10-23 NOTE — ED Provider Notes (Signed)
Medical screening examination/treatment/procedure(s) were conducted as a shared visit with non-physician practitioner(s) and myself.  I personally evaluated the patient during the encounter.  EKG Interpretation   None      78 year old male with rash. Morphology and and unilateral dermatomal distribution is consistent with shingles. He is currently being treated for the same. I see no reason to change his current therapy. Outpatient followup.  Raeford Razor, MD 10/23/13 [phone]

## 2013-10-24 ENCOUNTER — Ambulatory Visit: Payer: Medicare Other | Admitting: Neurology

## 2013-11-18 ENCOUNTER — Ambulatory Visit (HOSPITAL_COMMUNITY): Payer: Self-pay | Admitting: Psychiatry

## 2013-12-30 ENCOUNTER — Ambulatory Visit (INDEPENDENT_AMBULATORY_CARE_PROVIDER_SITE_OTHER): Payer: Medicare Other | Admitting: Psychiatry

## 2013-12-30 ENCOUNTER — Encounter (HOSPITAL_COMMUNITY): Payer: Self-pay | Admitting: Psychiatry

## 2013-12-30 VITALS — BP 126/60 | HR 60 | Ht 70.0 in | Wt 162.2 lb

## 2013-12-30 DIAGNOSIS — F319 Bipolar disorder, unspecified: Secondary | ICD-10-CM

## 2013-12-30 DIAGNOSIS — F09 Unspecified mental disorder due to known physiological condition: Secondary | ICD-10-CM

## 2013-12-30 MED ORDER — TRAZODONE HCL 50 MG PO TABS: 50.0000 mg | ORAL_TABLET | Freq: Every evening | ORAL | Status: DC | PRN

## 2013-12-30 MED ORDER — CLONAZEPAM 0.5 MG PO TABS: 0.5000 mg | ORAL_TABLET | Freq: Every day | ORAL | Status: DC

## 2013-12-30 MED ORDER — LAMOTRIGINE 150 MG PO TABS: 150.0000 mg | ORAL_TABLET | Freq: Two times a day (BID) | ORAL | Status: DC

## 2013-12-30 MED ORDER — SERTRALINE HCL 100 MG PO TABS: 100.0000 mg | ORAL_TABLET | Freq: Every day | ORAL | Status: DC

## 2013-12-30 MED ORDER — QUETIAPINE FUMARATE 50 MG PO TABS: 50.0000 mg | ORAL_TABLET | Freq: Every day | ORAL | Status: DC

## 2013-12-30 NOTE — Progress Notes (Signed)
[place] [phone] Progress Note  [name] [id number] 79 y.o.  12/30/2013 11:58 AM  Chief Complaint:  Medication management and followup.        History of Present Illness:  [name] came for his followup appointment.  His compliance with his psychotropic medication.  He has cut down his Klonopin in the morning .  He is less anxious and less depressed.  He visited the emergency room in October because of the zoster but he is not convinced that he had zoster.  Patient told some time he is feeling so good that he does not believe he has any psychiatric illness but he realized that he needed to take the medication for insomnia and good ADL care.  He continues to get in touch with this ex-wife who is supportive .  He has cut down further his home health aide hours .  He is living at [facility].  He gets sometime bored but he denies any irritability, agitation or any mood swing.  He is compliant with Zoloft, Lamictal, trazodone and Klonopin at bedtime.  Denies any shakes or tremors.  He understands that he has limited social network but he is comfortable with this living facility.  He denies any paranoia or any hallucinations.  He denies any crying spells.  He sees Dr. Floyde Parkins for his parkinsonism.    Suicidal Ideation: No Plan Formed: No Patient has means to carry out plan: No  Homicidal Ideation: No Plan Formed: No Patient has means to carry out plan: No  Review of Systems  HENT: Positive for hearing loss.   Psychiatric/Behavioral: Positive for memory loss.    Medical history Patient has history of diabetes mellitus, hypertension, Parkinson disease, gait instability, hitting impairment and cognitive impairment.  He was recently admitted at least twice for repeated UTI.    Family and Social History: Patient lives by herself.  He's been divorced for few years.  He has no children.  He lives in an assisted-living facility and he has a home health aid [phone].  He use to  go [facility] but lately he has been not going to these groups due to physical illness.  Outpatient Encounter Prescriptions as of 12/30/2013  Medication Sig  . clonazePAM (KLONOPIN) 0.5 MG tablet Take 1 tablet (0.5 mg total) by mouth at bedtime. For anxiety  . lamoTRIgine (LAMICTAL) 150 MG tablet Take 1 tablet (150 mg total) by mouth 2 (two) times daily.  . QUEtiapine (SEROQUEL) 50 MG tablet Take 1 tablet (50 mg total) by mouth at bedtime.  . sertraline (ZOLOFT) 100 MG tablet Take 1 tablet (100 mg total) by mouth daily. Take 2 tab daily  . traZODone (DESYREL) 50 MG tablet Take 1 tablet (50 mg total) by mouth at bedtime as needed for sleep. sleep  . [DISCONTINUED] clonazePAM (KLONOPIN) 0.5 MG tablet Take 0.5 mg by mouth 2 (two) times daily as needed for anxiety. For anxiety  . [DISCONTINUED] lamoTRIgine (LAMICTAL) 150 MG tablet Take 150 mg by mouth 2 (two) times daily.  . [DISCONTINUED] QUEtiapine (SEROQUEL) 50 MG tablet Take 50 mg by mouth at bedtime.  . [DISCONTINUED] sertraline (ZOLOFT) 100 MG tablet Take 100 mg by mouth daily. Take 2 tab daily  . [DISCONTINUED] traZODone (DESYREL) 50 MG tablet Take 50 mg by mouth at bedtime as needed for sleep. sleep  . acetaminophen (TYLENOL) 325 MG tablet Take 2 tablets (650 mg total) by mouth every 6 (six) hours as needed for pain or fever.  Marland Kitchen  amLODipine (NORVASC) 5 MG tablet Take 1 tablet (5 mg total) by mouth daily.  Marland Kitchen aspirin 81 MG chewable tablet Chew 81 mg by mouth daily.  . carbidopa-levodopa (SINEMET IR) 25-250 MG per tablet Take 1 tablet by mouth 3 (three) times daily.  . Cranberry (AZO-CRANBERRY) 450 MG TABS Take 1 tablet by mouth every morning.  . entacapone (COMTAN) 200 MG tablet Take 200 mg by mouth 3 (three) times daily.  . fluticasone (FLONASE) 50 MCG/ACT nasal spray Place 2 sprays into the nose daily.  Marland Kitchen GLUCERNA (GLUCERNA) LIQD Take 237 mLs by mouth 2 (two) times daily between meals.  Marland Kitchen guaiFENesin (MUCINEX) 600 MG 12 hr tablet Take  1,200 mg by mouth 2 (two) times daily as needed for congestion.   . hydrALAZINE (APRESOLINE) 25 MG tablet Take 25 mg by mouth 3 (three) times daily.  Marland Kitchen lisinopril (PRINIVIL,ZESTRIL) 10 MG tablet Take 1 tablet (10 mg total) by mouth daily.  . phenazopyridine (PYRIDIUM) 100 MG tablet Take 100 mg by mouth 3 (three) times daily.  . polyethylene glycol (MIRALAX / GLYCOLAX) packet Take 17 g by mouth daily.  . Red Yeast Rice Extract (RED YEAST RICE PO) Take 2 tablets by mouth at bedtime.   [name] 500 MG CAPS Take 1 capsule by mouth at bedtime.   No results found for this or any previous visit (from the past 2160 hour(s)).  Past Psychiatric History/Hospitalization(s): Patient has significant history of bipolar disorder.  He has multiple psychiatric admission due to decompensation and noncompliance of medication.  Patient has history of aggression and impulsive behavior.  His last psychiatric admission was in 2005. Anxiety: Yes Bipolar Disorder: Yes Depression: No Mania: No Psychosis: No Schizophrenia: No Personality Disorder: No Hospitalization for psychiatric illness: Yes History of Electroconvulsive Shock Therapy: No Prior Suicide Attempts: No  Physical Exam: Constitutional:  BP 126/60  Pulse 60  Ht 5\' 10"  (1.778 m)  Wt 162 lb 3.2 oz (73.573 kg)  BMI 23.27 kg/m2  General Appearance: Patient is pleasant but confused.  He is fairly groomed.  Musculoskeletal: Strength & Muscle Tone: decreased and atrophy Gait & Station: unsteady Patient leans: Front  Mental status examination Patient is casually dressed and fairly groomed.  He is pleasant and cooperative.  He continues to have difficulty remembering things.  His speech is soft and spontaneous with normal tone and volume.  His thought processes slow but logical and goal directed.  He denies any active or passive suicidal thoughts or homicidal thoughts.  He denies any auditory or visual hallucination.  His attention and  concentration is poor.  He described his mood is anxious and his affect is mood appropriate.  There were no delusions , paranoia or obsession present at this time.  He has fine tremors and has difficulty walking.  He need walker .  His gait is unsteady.  He's alert and oriented x3.  His insight judgment and impulse control is fair.  Medical Decision Making (Choose Three): Established Problem, Stable/Improving (1), Review of Psycho-Social Stressors (1), Review or order clinical lab tests (1), Decision to obtain old records (1), Review of Last Therapy Session (1) and Review of New Medication or Change in Dosage (2)  Assessment: Axis I: Bipolar disorder, cognitive disorder NOS  Axis II: Deferred  Axis III: See medical history  Axis IV: Moderate  Axis V: 50-55   Plan:  I reviewed the discharge note from emergency room visit and his current psychotropic medication.  I will discontinue Klonopin during the  day and he will continue to take Klonopin at bedtime.  He is doing better on Zoloft 200 mg at bedtime, trazodone 50 mg at bedtime and Seroquel 50 mg at bedtime.  Recommend to continue his current psychotropic medication.  Recommend to call us back if he has any question or any concern.  Followup in 3 months. Time spent 25 minutes.  More than 50% of the time spent in psychoeducation, counseling and coordination of care.  Discuss safety plan that anytime having active suicidal thoughts or homicidal thoughts then patient need to call 911 or go to the local emergency room.  Urian Martenson T., MD 12/30/2013

## 2013-12-31 ENCOUNTER — Ambulatory Visit (HOSPITAL_COMMUNITY): Payer: Self-pay | Admitting: Psychiatry

## 2014-03-31 ENCOUNTER — Ambulatory Visit (HOSPITAL_COMMUNITY): Payer: Self-pay | Admitting: Psychiatry

## 2014-03-31 ENCOUNTER — Encounter (HOSPITAL_COMMUNITY): Payer: Self-pay | Admitting: Psychiatry

## 2014-03-31 ENCOUNTER — Ambulatory Visit (INDEPENDENT_AMBULATORY_CARE_PROVIDER_SITE_OTHER): Payer: Medicare Other | Admitting: Psychiatry

## 2014-03-31 VITALS — BP 135/67 | HR 53 | Ht 70.0 in | Wt 164.6 lb

## 2014-03-31 DIAGNOSIS — F319 Bipolar disorder, unspecified: Secondary | ICD-10-CM

## 2014-03-31 DIAGNOSIS — F09 Unspecified mental disorder due to known physiological condition: Secondary | ICD-10-CM

## 2014-03-31 MED ORDER — LAMOTRIGINE 150 MG PO TABS: 150.0000 mg | ORAL_TABLET | Freq: Two times a day (BID) | ORAL | Status: DC

## 2014-03-31 MED ORDER — SERTRALINE HCL 100 MG PO TABS: 100.0000 mg | ORAL_TABLET | Freq: Every day | ORAL | Status: DC

## 2014-03-31 MED ORDER — TRAZODONE HCL 50 MG PO TABS: 50.0000 mg | ORAL_TABLET | Freq: Every evening | ORAL | Status: DC | PRN

## 2014-03-31 MED ORDER — CLONAZEPAM 0.5 MG PO TABS: 0.5000 mg | ORAL_TABLET | Freq: Every day | ORAL | Status: DC

## 2014-03-31 MED ORDER — QUETIAPINE FUMARATE 50 MG PO TABS
50.0000 mg | ORAL_TABLET | Freq: Every day | ORAL | Status: DC
Start: 2014-03-31 — End: 2014-07-01

## 2014-03-31 NOTE — Progress Notes (Signed)
[name] [phone] Progress Note  [name] [id number] 79 y.o.  03/31/2014 11:53 AM  Chief Complaint:  Medication management and followup.        History of Present Illness:  [name] came for his followup appointment.  His compliance with his psychotropic medication.  He side effects of medication.  He is only taking Klonopin at bedtime.  His ADD is much improved.  He is able to do treadmill 7 days a week .  His walking is improved.  He denies a recent crying spells, irritability or any anger.  He feels his current medicine is working very well.  He is sleeping good and denies any racing thoughts or any agitation.  He denies any paranoia or any hallucination.  He continues to get in touch with this ex-wife who is supportive .  He is living at [facility].  He is scheduled to see Dr. Floyde Parkins next Wednesday for his Parkinson.      Suicidal Ideation: No Plan Formed: No Patient has means to carry out plan: No  Homicidal Ideation: No Plan Formed: No Patient has means to carry out plan: No  Review of Systems  HENT: Positive for hearing loss.     Medical history Patient has history of diabetes mellitus, hypertension, Parkinson disease, gait instability, hitting impairment and cognitive impairment.  He was recently admitted at least twice for repeated UTI.    Family and Social History: Patient lives by herself.  He's been divorced for few years.  He has no children.  He lives in an assisted-living facility and he has a home health aid [phone].  He use to go [facility] but lately he has been not going to these groups due to physical illness.  Outpatient Encounter Prescriptions as of 03/31/2014  Medication Sig  . acetaminophen (TYLENOL) 325 MG tablet Take 2 tablets (650 mg total) by mouth every 6 (six) hours as needed for pain or fever.  Marland Kitchen amLODipine (NORVASC) 5 MG tablet Take 1 tablet (5 mg total) by mouth daily.  Marland Kitchen aspirin 81 MG chewable tablet Chew 81 mg by mouth  daily.  . carbidopa-levodopa (SINEMET IR) 25-250 MG per tablet Take 1 tablet by mouth 3 (three) times daily.  . clonazePAM (KLONOPIN) 0.5 MG tablet Take 1 tablet (0.5 mg total) by mouth at bedtime. For anxiety  . Cranberry (AZO-CRANBERRY) 450 MG TABS Take 1 tablet by mouth every morning.  . entacapone (COMTAN) 200 MG tablet Take 200 mg by mouth 3 (three) times daily.  . fluticasone (FLONASE) 50 MCG/ACT nasal spray Place 2 sprays into the nose daily.  Marland Kitchen GLUCERNA (GLUCERNA) LIQD Take 237 mLs by mouth 2 (two) times daily between meals.  Marland Kitchen guaiFENesin (MUCINEX) 600 MG 12 hr tablet Take 1,200 mg by mouth 2 (two) times daily as needed for congestion.   . hydrALAZINE (APRESOLINE) 25 MG tablet Take 25 mg by mouth 3 (three) times daily.  Marland Kitchen lamoTRIgine (LAMICTAL) 150 MG tablet Take 1 tablet (150 mg total) by mouth 2 (two) times daily.  Marland Kitchen lisinopril (PRINIVIL,ZESTRIL) 10 MG tablet Take 1 tablet (10 mg total) by mouth daily.  . phenazopyridine (PYRIDIUM) 100 MG tablet Take 100 mg by mouth 3 (three) times daily.  . polyethylene glycol (MIRALAX / GLYCOLAX) packet Take 17 g by mouth daily.  . QUEtiapine (SEROQUEL) 50 MG tablet Take 1 tablet (50 mg total) by mouth at bedtime.  . Red Yeast Rice Extract (RED YEAST RICE PO) Take 2 tablets by  mouth at bedtime.   . sertraline (ZOLOFT) 100 MG tablet Take 1 tablet (100 mg total) by mouth daily. Take 2 tab daily  . traZODone (DESYREL) 50 MG tablet Take 1 tablet (50 mg total) by mouth at bedtime as needed for sleep. sleep  . Valerian Root 500 MG CAPS Take 1 capsule by mouth at bedtime.  . [DISCONTINUED] clonazePAM (KLONOPIN) 0.5 MG tablet Take 1 tablet (0.5 mg total) by mouth at bedtime. For anxiety  . [DISCONTINUED] lamoTRIgine (LAMICTAL) 150 MG tablet Take 1 tablet (150 mg total) by mouth 2 (two) times daily.  . [DISCONTINUED] QUEtiapine (SEROQUEL) 50 MG tablet Take 1 tablet (50 mg total) by mouth at bedtime.  . [DISCONTINUED] sertraline (ZOLOFT) 100 MG tablet Take 1  tablet (100 mg total) by mouth daily. Take 2 tab daily  . [DISCONTINUED] traZODone (DESYREL) 50 MG tablet Take 1 tablet (50 mg total) by mouth at bedtime as needed for sleep. sleep   No results found for this or any previous visit (from the past 2160 hour(s)).  Past Psychiatric History/Hospitalization(s): Patient has significant history of bipolar disorder.  He has multiple psychiatric admission due to decompensation and noncompliance of medication.  Patient has history of aggression and impulsive behavior.  His last psychiatric admission was in 2005. Anxiety: Yes Bipolar Disorder: Yes Depression: No Mania: No Psychosis: No Schizophrenia: No Personality Disorder: No Hospitalization for psychiatric illness: Yes History of Electroconvulsive Shock Therapy: No Prior Suicide Attempts: No  Physical Exam: Constitutional:  BP 135/67  Pulse 53  Ht 5\' 10"  (1.778 m)  Wt 164 lb 9.6 oz (74.662 kg)  BMI 23.62 kg/m2  General Appearance: Patient is pleasant but confused.  He is fairly groomed.  Musculoskeletal: Strength & Muscle Tone: decreased and atrophy Gait & Station: unsteady Patient leans: Front  Mental status examination Patient is casually dressed and fairly groomed.  He is pleasant and cooperative.  He continues to have difficulty remembering things.  His speech is soft and spontaneous with normal tone and volume.  His thought processes slow but logical and goal directed.  He denies any active or passive suicidal thoughts or homicidal thoughts.  He denies any auditory or visual hallucination.  His attention and concentration is poor.  He described his mood is anxious and his affect is mood appropriate.  There were no delusions , paranoia or obsession present at this time.  He has fine tremors and has difficulty walking.  He need walker .  His gait is unsteady.  He's alert and oriented x3.  His insight judgment and impulse control is fair.  Established Problem, Stable/Improving (1),  Review of Psycho-Social Stressors (1), Review of Last Therapy Session (1) and Review of New Medication or Change in Dosage (2)  Assessment: Axis I: Bipolar disorder, cognitive disorder NOS  Axis II: Deferred  Axis III: See medical history  Axis IV: Moderate  Axis V: 50-55   Plan:  Patient is doing better on his current psychotropic medication.  I will continue Klonopin at bedtime along with Zoloft 200 mg at bedtime, trazodone 50 mg at bedtime and Seroquel 50 mg at bedtime.  Recommend to call us back if he has any question or any concern.  Followup in 3 months.  Kalisha Keadle T., MD 03/31/2014

## 2014-04-01 ENCOUNTER — Ambulatory Visit (HOSPITAL_COMMUNITY): Payer: Self-pay | Admitting: Psychiatry

## 2014-04-02 ENCOUNTER — Ambulatory Visit (INDEPENDENT_AMBULATORY_CARE_PROVIDER_SITE_OTHER): Payer: Medicare Other | Admitting: Neurology

## 2014-04-02 ENCOUNTER — Encounter: Payer: Self-pay | Admitting: Neurology

## 2014-04-02 VITALS — BP 152/72 | HR 72 | Wt 167.0 lb

## 2014-04-02 DIAGNOSIS — R269 Unspecified abnormalities of gait and mobility: Secondary | ICD-10-CM

## 2014-04-02 DIAGNOSIS — G20 Parkinson's disease: Secondary | ICD-10-CM

## 2014-04-02 NOTE — Patient Instructions (Signed)
Parkinson Disease Parkinson disease is a disorder of the central nervous system, which includes the brain and spinal cord. A person with this disease slowly loses the ability to completely control body movements. Within the brain, there is a group of nerve cells (basal ganglia) that help control movement. The basal ganglia are damaged and do not work properly in a person with Parkinson disease. In addition, the basal ganglia produce and use a brain chemical called dopamine. The dopamine chemical sends messages to other parts of the body to control and coordinate body movements. Dopamine levels are low in a person with Parkinson disease. If the dopamine levels are low, then the body does not receive the correct messages it needs to move normally.  CAUSES  The exact reason why the basal ganglia get damaged is not known. Some medical researchers have thought that infection, genes, environment, and certain medicines may contribute to the cause.  SYMPTOMS   An early symptom of Parkinson disease is often an uncontrolled shaking (tremor) of the hands. The tremor will often disappear when the affected hand is consciously used.  As the disease progresses, walking, talking, getting out of a chair, and new movements become more difficult.  Muscles get stiff and movements become slower.  Balance and coordination become harder.  Depression, trouble swallowing, urinary problems, constipation, and sleep problems can occur.  Later in the disease, memory and thought processes may deteriorate. DIAGNOSIS  There are no specific tests to diagnose Parkinson disease. You may be referred to a neurologist for evaluation. Your caregiver will ask about your medical history, symptoms, and perform a physical exam. Blood tests and imaging tests of your brain may be performed to rule out other diseases. The imaging tests may include an MRI or a CT scan. TREATMENT  The goal of treatment is to relieve symptoms. Medicines may be  prescribed once the symptoms become troublesome. Medicine will not stop the progression of the disease, but medicine can make movement and balance better and help control tremors. Speech and occupational therapy may also be prescribed. Sometimes, surgical treatment of the brain can be done in young people. HOME CARE INSTRUCTIONS  Get regular exercise and rest periods during the day to help prevent exhaustion and depression.  If getting dressed becomes difficult, replace buttons and zippers with Velcro and elastic on your clothing.  Take all medicine as directed by your caregiver.  Install grab bars or railings in your home to prevent falls.  Go to speech or occupational therapy as directed.  Keep all follow-up visits as directed by your caregiver. SEEK MEDICAL CARE IF:  Your symptoms are not controlled with your medicine.  You fall.  You have trouble swallowing or choke on your food. MAKE SURE YOU:  Understand these instructions.  Will watch your condition.  Will get help right away if you are not doing well or get worse. Document Released: 12/01/2000 Document Revised: 03/31/2013 Document Reviewed: 01/03/2012 Exit[employer] Patient Information 2014 [employer].  

## 2014-04-02 NOTE — Progress Notes (Signed)
Reason for visit: Parkinson's disease  [name]. is an 79 y.o. male  History of present illness:  [name] is a 79 year old right-handed white male with a history of Parkinson's disease. The patient has not been seen in over one year, and he appears to actually have improved as time has gone on. The patient uses a walker for ambulation, and he is on Sinemet taking the 25/250 mg tablets, one tablet 3 times a day with Comtan 200 mg. The patient indicates that he has not had any falls. The patient denies problems with swallowing or choking. The patient has had some memory issues over time. The patient returns for an evaluation. The patient reports some dizziness with standing. The patient is on 3 different blood pressure medications at this time.  Past Medical History  Diagnosis Date  . Hearing aid worn   . Hypertension   . Parkinson disease   . Pneumonia   . Diabetes mellitus type II   . Arthritis   . Bipolar disorder   . Anxiety   . Depression   . MRSA infection (methicillin-resistant Staphylococcus aureus)   . Chronic indwelling Foley catheter   . C2 cervical fracture     due to pt fall  . SIRS (systemic inflammatory response syndrome)   . Neuromuscular disorder   . Memory loss   . Benign prostate hyperplasia     Past Surgical History  Procedure Laterality Date  . Total hip arthroplasty    . Video bronchoscopy  12/16/2012    Procedure: VIDEO BRONCHOSCOPY;  Surgeon: Melrose Nakayama, MD;  Location: [place];  Service: Thoracic;  Laterality: Right;  . Video assisted thoracoscopy (vats)/empyema  12/16/2012    Procedure: VIDEO ASSISTED THORACOSCOPY (VATS)/EMPYEMA;  Surgeon: Melrose Nakayama, MD;  Location: [place];  Service: Thoracic;  Laterality: Right;    Family History  Problem Relation Age of Onset  . Depression Father   . Cancer Mother   . Heart disease Mother     Social history:  reports that he has never smoked. He has never used smokeless  tobacco. He reports that he does not drink alcohol or use illicit drugs.    Allergies  Allergen Reactions  . Cholestatin Other (See Comments)    Per MAR    Medications:  Current Outpatient Prescriptions on File Prior to Visit  Medication Sig Dispense Refill  . acetaminophen (TYLENOL) 325 MG tablet Take 2 tablets (650 mg total) by mouth every 6 (six) hours as needed for pain or fever.      Marland Kitchen amLODipine (NORVASC) 5 MG tablet Take 1 tablet (5 mg total) by mouth daily.      Marland Kitchen aspirin 81 MG chewable tablet Chew 81 mg by mouth daily.      . carbidopa-levodopa (SINEMET IR) 25-250 MG per tablet Take 1 tablet by mouth 3 (three) times daily.      . clonazePAM (KLONOPIN) 0.5 MG tablet Take 1 tablet (0.5 mg total) by mouth at bedtime. For anxiety  30 tablet  2  . Cranberry (AZO-CRANBERRY) 450 MG TABS Take 1 tablet by mouth every morning.      . entacapone (COMTAN) 200 MG tablet Take 200 mg by mouth 3 (three) times daily.      . fluticasone (FLONASE) 50 MCG/ACT nasal spray Place 2 sprays into the nose daily.      Marland Kitchen GLUCERNA (GLUCERNA) LIQD Take 237 mLs by mouth 2 (two) times daily between meals.      Marland Kitchen  guaiFENesin (MUCINEX) 600 MG 12 hr tablet Take 1,200 mg by mouth 2 (two) times daily as needed for congestion.       . hydrALAZINE (APRESOLINE) 25 MG tablet Take 25 mg by mouth 3 (three) times daily.      Marland Kitchen lamoTRIgine (LAMICTAL) 150 MG tablet Take 1 tablet (150 mg total) by mouth 2 (two) times daily.  180 tablet  0  . lisinopril (PRINIVIL,ZESTRIL) 10 MG tablet Take 1 tablet (10 mg total) by mouth daily.      . phenazopyridine (PYRIDIUM) 100 MG tablet Take 100 mg by mouth 3 (three) times daily.      . polyethylene glycol (MIRALAX / GLYCOLAX) packet Take 17 g by mouth daily.      . QUEtiapine (SEROQUEL) 50 MG tablet Take 1 tablet (50 mg total) by mouth at bedtime.  90 tablet  0  . Red Yeast Rice Extract (RED YEAST RICE PO) Take 2 tablets by mouth at bedtime.       . sertraline (ZOLOFT) 100 MG tablet  Take 1 tablet (100 mg total) by mouth daily. Take 2 tab daily  180 tablet  0  . traZODone (DESYREL) 50 MG tablet Take 1 tablet (50 mg total) by mouth at bedtime as needed for sleep. sleep  90 tablet  0  . Valerian Root 500 MG CAPS Take 1 capsule by mouth at bedtime.       No current facility-administered medications on file prior to visit.    ROS:  Out of a complete 14 system review of symptoms, the patient complains only of the following symptoms, and all other reviewed systems are negative.  Dizziness Gait disturbance  Blood pressure 152/72, pulse 72, weight 167 lb (75.751 kg).  Physical Exam  General: The patient is alert and cooperative at the time of the examination.  Skin: No significant peripheral edema is noted.   Neurologic Exam  Mental status: The Mini-Mental status examination done today shows a total score of 26/30.  Cranial nerves: Facial symmetry is present. Speech is normal, no aphasia or dysarthria is noted. Extraocular movements are full. Visual fields are full.  Motor: The patient has good strength in all 4 extremities.  Sensory examination: Soft touch sensation is symmetric on the face, arms, and legs.  Coordination: The patient has good finger-nose-finger and heel-to-shin bilaterally.  Gait and station: The patient is able to rise with the arms crossed. Once up, the patient does have some bilateral arm swing, good stride, patient is able to walk without a walker or cane. The patient usually uses a walker for ambulation. Tandem gait is unsteady. Romberg is positive. No drift is seen.  Reflexes: Deep tendon reflexes are symmetric.   Assessment/Plan:  1. History of Parkinson's disease  2. Gait disturbance  3. Memory disturbance  The patient appears to have actually improved as time has gone on with his parkinsonian features on a stable dose of medication. This would put the diagnosis of Parkinson's disease into question. At one point, the patient was  bedridden, but he is now is fully ambulatory, with some gait instability. The patient has good animation of the face and a normal speech pattern. At this point, we will keep the Sinemet dosing the same, but we may consider a referral for second opinion regarding his Parkinson's disease in the future. The patient has had lack of progression and actual improvement on a stable dose of Sinemet over the last 2 years.  Jill Alexanders MD 04/02/2014 9:13 PM  [facility] [address] [place], [place] [address]  Phone [phone] Fax [phone]

## 2014-04-03 ENCOUNTER — Other Ambulatory Visit (HOSPITAL_COMMUNITY): Payer: Self-pay | Admitting: Psychiatry

## 2014-04-03 ENCOUNTER — Telehealth (HOSPITAL_COMMUNITY): Payer: Self-pay

## 2014-04-03 NOTE — Telephone Encounter (Signed)
I returned patient's phone call.  Patient is requesting to change to Klonopin as needed rather than taking at bedtime.  He does not take every day.  I suggested to have his nurse faxed [place] a new order from and I will change the directions.

## 2014-04-17 ENCOUNTER — Inpatient Hospital Stay (HOSPITAL_COMMUNITY)
Admission: EM | Admit: 2014-04-17 | Discharge: 2014-04-21 | DRG: 193 | Disposition: A | Discharge status: Home Health | Payer: Medicare Other | Attending: Internal Medicine | Admitting: Internal Medicine

## 2014-04-17 ENCOUNTER — Encounter (HOSPITAL_COMMUNITY): Payer: Self-pay | Admitting: Emergency Medicine

## 2014-04-17 ENCOUNTER — Emergency Department (HOSPITAL_COMMUNITY): Payer: Medicare Other

## 2014-04-17 DIAGNOSIS — Z8249 Family history of ischemic heart disease and other diseases of the circulatory system: Secondary | ICD-10-CM

## 2014-04-17 DIAGNOSIS — E119 Type 2 diabetes mellitus without complications: Secondary | ICD-10-CM

## 2014-04-17 DIAGNOSIS — Z960 Presence of urogenital implants: Secondary | ICD-10-CM

## 2014-04-17 DIAGNOSIS — J189 Pneumonia, unspecified organism: Secondary | ICD-10-CM

## 2014-04-17 DIAGNOSIS — I10 Essential (primary) hypertension: Secondary | ICD-10-CM

## 2014-04-17 DIAGNOSIS — R0602 Shortness of breath: Secondary | ICD-10-CM

## 2014-04-17 DIAGNOSIS — J4489 Other specified chronic obstructive pulmonary disease: Secondary | ICD-10-CM | POA: Diagnosis present

## 2014-04-17 DIAGNOSIS — Z7982 Long term (current) use of aspirin: Secondary | ICD-10-CM

## 2014-04-17 DIAGNOSIS — G20 Parkinson's disease: Secondary | ICD-10-CM

## 2014-04-17 DIAGNOSIS — Z79899 Other long term (current) drug therapy: Secondary | ICD-10-CM

## 2014-04-17 DIAGNOSIS — Z8701 Personal history of pneumonia (recurrent): Secondary | ICD-10-CM

## 2014-04-17 DIAGNOSIS — IMO0002 Reserved for concepts with insufficient information to code with codable children: Secondary | ICD-10-CM

## 2014-04-17 DIAGNOSIS — I5032 Chronic diastolic (congestive) heart failure: Secondary | ICD-10-CM

## 2014-04-17 DIAGNOSIS — N390 Urinary tract infection, site not specified: Secondary | ICD-10-CM

## 2014-04-17 DIAGNOSIS — F319 Bipolar disorder, unspecified: Secondary | ICD-10-CM

## 2014-04-17 DIAGNOSIS — E440 Moderate protein-calorie malnutrition: Secondary | ICD-10-CM | POA: Diagnosis present

## 2014-04-17 DIAGNOSIS — J449 Chronic obstructive pulmonary disease, unspecified: Secondary | ICD-10-CM | POA: Diagnosis present

## 2014-04-17 DIAGNOSIS — Z96649 Presence of unspecified artificial hip joint: Secondary | ICD-10-CM

## 2014-04-17 DIAGNOSIS — R0902 Hypoxemia: Secondary | ICD-10-CM

## 2014-04-17 DIAGNOSIS — Z978 Presence of other specified devices: Secondary | ICD-10-CM

## 2014-04-17 DIAGNOSIS — J9600 Acute respiratory failure, unspecified whether with hypoxia or hypercapnia: Secondary | ICD-10-CM | POA: Diagnosis present

## 2014-04-17 DIAGNOSIS — M129 Arthropathy, unspecified: Secondary | ICD-10-CM | POA: Diagnosis present

## 2014-04-17 DIAGNOSIS — F411 Generalized anxiety disorder: Secondary | ICD-10-CM | POA: Diagnosis present

## 2014-04-17 DIAGNOSIS — G20A1 Parkinson's disease without dyskinesia, without mention of fluctuations: Secondary | ICD-10-CM

## 2014-04-17 DIAGNOSIS — I509 Heart failure, unspecified: Secondary | ICD-10-CM | POA: Diagnosis present

## 2014-04-17 LAB — CBC WITH DIFFERENTIAL/PLATELET
Basophils Absolute: 0 10*3/uL (ref 0.0–0.1)
Basophils Relative: 0 % (ref 0–1)
EOS PCT: 0 % (ref 0–5)
Eosinophils Absolute: 0 10*3/uL (ref 0.0–0.7)
HCT: 38.7 % — ABNORMAL LOW (ref 39.0–52.0)
HEMOGLOBIN: 12.8 g/dL — AB (ref 13.0–17.0)
LYMPHS ABS: 1.2 10*3/uL (ref 0.7–4.0)
LYMPHS PCT: 14 % (ref 12–46)
MCH: 31.4 pg (ref 26.0–34.0)
MCHC: 33.1 g/dL (ref 30.0–36.0)
MCV: 95.1 fL (ref 78.0–100.0)
MONO ABS: 0.6 10*3/uL (ref 0.1–1.0)
MONOS PCT: 6 % (ref 3–12)
Neutro Abs: 7.2 10*3/uL (ref 1.7–7.7)
Neutrophils Relative %: 79 % — ABNORMAL HIGH (ref 43–77)
Platelets: 167 10*3/uL (ref 150–400)
RBC: 4.07 MIL/uL — AB (ref 4.22–5.81)
RDW: 13.2 % (ref 11.5–15.5)
WBC: 9 10*3/uL (ref 4.0–10.5)

## 2014-04-17 LAB — BASIC METABOLIC PANEL
BUN: 28 mg/dL — AB (ref 6–23)
CALCIUM: 9 mg/dL (ref 8.4–10.5)
CO2: 24 meq/L (ref 19–32)
CREATININE: 1.13 mg/dL (ref 0.50–1.35)
Chloride: 102 mEq/L (ref 96–112)
GFR calc Af Amer: 70 mL/min — ABNORMAL LOW (ref >90)
GFR calc non Af Amer: 61 mL/min — ABNORMAL LOW (ref >90)
GLUCOSE: 134 mg/dL — AB (ref 70–99)
Potassium: 4.3 mEq/L (ref 3.7–5.3)
Sodium: 141 mEq/L (ref 137–147)

## 2014-04-17 LAB — I-STAT TROPONIN, ED: Troponin i, poc: 0.03 ng/mL (ref 0.00–0.08)

## 2014-04-17 LAB — PRO B NATRIURETIC PEPTIDE: Pro B Natriuretic peptide (BNP): 1227 pg/mL — ABNORMAL HIGH (ref 0–450)

## 2014-04-17 NOTE — ED Notes (Signed)
Patient transported to X-ray 

## 2014-04-17 NOTE — ED Notes (Signed)
Bed: WA07 Expected date:  Expected time:  Means of arrival:  Comments: EMS 

## 2014-04-17 NOTE — ED Notes (Signed)
Per EMS, Fire department reported that pt. O2sats were 90% on room air.  Pt put on non-rebreather at 98%. Pt. Had rhonchi in lower lobes. Pt has had productive cough x 3 days. Doctor on scene sts that he had aspiration PNA 3 years ago. A&Ox4. Pt. cbg 128. 99% with treatment. Pt has had 10 of albuterol and .5 of atrovent. Pt has 20g in R hand.

## 2014-04-17 NOTE — ED Provider Notes (Signed)
CSN: [id number]     Arrival date & time 04/17/14  2236 History   First MD Initiated Contact with Patient 04/17/14 2256     Chief Complaint  Patient presents with  . Shortness of Breath     (Consider location/radiation/quality/duration/timing/severity/associated sxs/prior Treatment) HPI Comments: Patient is a 79 year old male with history of hypertension, parkinson disease, diabetes, bipolar disorder, anxiety, depression, BPH who presents today with 3 days of gradually worsening shortness of breath. He has had associated productive cough. Patient does not wear oxygen at home and does not have any albuterol inhalers that he takes at home. He has a history of aspiration pneumonia, but states he doesn't remember if this feels like that. Patient is a poor historian.   The history is provided by the patient. No language interpreter was used.    Past Medical History  Diagnosis Date  . Hearing aid worn   . Hypertension   . Parkinson disease   . Pneumonia   . Diabetes mellitus type II   . Arthritis   . Bipolar disorder   . Anxiety   . Depression   . MRSA infection (methicillin-resistant Staphylococcus aureus)   . Chronic indwelling Foley catheter   . C2 cervical fracture     due to pt fall  . SIRS (systemic inflammatory response syndrome)   . Neuromuscular disorder   . Memory loss   . Benign prostate hyperplasia    Past Surgical History  Procedure Laterality Date  . Total hip arthroplasty    . Video bronchoscopy  12/16/2012    Procedure: VIDEO BRONCHOSCOPY;  Surgeon: Melrose Nakayama, MD;  Location: [place];  Service: Thoracic;  Laterality: Right;  . Video assisted thoracoscopy (vats)/empyema  12/16/2012    Procedure: VIDEO ASSISTED THORACOSCOPY (VATS)/EMPYEMA;  Surgeon: Melrose Nakayama, MD;  Location: [place];  Service: Thoracic;  Laterality: Right;   Family History  Problem Relation Age of Onset  . Depression Father   . Cancer Mother   . Heart disease Mother    History   Substance Use Topics  . Smoking status: Never Smoker   . Smokeless tobacco: Never Used  . Alcohol Use: No     Comment: occasional    Review of Systems  Constitutional: Negative for fever and chills.  Respiratory: Positive for cough and shortness of breath.   Cardiovascular: Positive for chest pain.  Gastrointestinal: Negative for nausea, vomiting and abdominal pain.  All other systems reviewed and are negative.     Allergies  Cholestatin  Home Medications   Prior to Admission medications   Medication Sig Start Date End Date Taking? Authorizing Provider  amLODipine (NORVASC) 5 MG tablet Take 1 tablet (5 mg total) by mouth daily. 02/03/13  Yes Janece Canterbury, MD  aspirin 81 MG chewable tablet Chew 81 mg by mouth daily.   Yes Historical Provider, MD  carbidopa-levodopa (SINEMET IR) 25-250 MG per tablet Take 1 tablet by mouth 3 (three) times daily.   Yes Historical Provider, MD  clonazePAM (KLONOPIN) 0.5 MG tablet Take 1 tablet (0.5 mg total) by mouth at bedtime. For anxiety 03/31/14  Yes Kathlee Nations, MD  entacapone (COMTAN) 200 MG tablet Take 200 mg by mouth 3 (three) times daily.   Yes Historical Provider, MD  GLUCERNA (GLUCERNA) LIQD Take 237 mLs by mouth 2 (two) times daily between meals.   Yes Historical Provider, MD  hydrALAZINE (APRESOLINE) 25 MG tablet Take 25 mg by mouth 3 (three) times daily.   Yes Historical  Provider, MD  lamoTRIgine (LAMICTAL) 150 MG tablet Take 1 tablet (150 mg total) by mouth 2 (two) times daily. 03/31/14  Yes Kathlee Nations, MD  lisinopril (PRINIVIL,ZESTRIL) 10 MG tablet Take 1 tablet (10 mg total) by mouth daily. 02/03/13  Yes Janece Canterbury, MD  Multiple Vitamin (MULTIVITAMIN WITH MINERALS) TABS tablet Take 1 tablet by mouth daily.   Yes Historical Provider, MD  phenazopyridine (PYRIDIUM) 100 MG tablet Take 100 mg by mouth 3 (three) times daily.   Yes Historical Provider, MD  polyethylene glycol (MIRALAX / GLYCOLAX) packet Take 17 g by mouth daily.    Yes Historical Provider, MD  QUEtiapine (SEROQUEL) 50 MG tablet Take 1 tablet (50 mg total) by mouth at bedtime. 03/31/14  Yes Kathlee Nations, MD  Red Yeast Rice Extract (RED YEAST RICE PO) Take 2 tablets by mouth at bedtime.    Yes Historical Provider, MD  sertraline (ZOLOFT) 100 MG tablet Take 200 mg by mouth daily.   Yes Historical Provider, MD  traZODone (DESYREL) 50 MG tablet Take 50-100 mg by mouth at bedtime. 50 mg in the am and 100mg  at bedtime   Yes Historical Provider, MD  Valerian Root 500 MG CAPS Take 1 capsule by mouth at bedtime.   Yes Historical Provider, MD   BP 140/63  Temp(Src) 98.8 F (37.1 C)  Resp 22  SpO2 98% Physical Exam  Nursing note and vitals reviewed. Constitutional: He is oriented to person, place, and time. He appears well-developed and well-nourished. No distress.  HENT:  Head: Normocephalic and atraumatic.  Right Ear: External ear normal.  Left Ear: External ear normal.  Nose: Nose normal.  Eyes: Conjunctivae are normal.  Neck: Normal range of motion. No tracheal deviation present.  Cardiovascular: Normal rate, regular rhythm and normal heart sounds.   Pulmonary/Chest: Effort normal. No stridor. He has wheezes. He has rhonchi.  Abdominal: Soft. He exhibits no distension. There is no tenderness.  Musculoskeletal: Normal range of motion.  Neurological: He is alert and oriented to person, place, and time.  Skin: Skin is warm and dry. He is not diaphoretic.  Psychiatric: He has a normal mood and affect. His behavior is normal.    ED Course  Procedures (including critical care time) Labs Review Labs Reviewed  PRO B NATRIURETIC PEPTIDE - Abnormal; Notable for the following:    Pro B Natriuretic peptide (BNP) 1227.0 (*)    All other components within normal limits  CBC WITH DIFFERENTIAL - Abnormal; Notable for the following:    RBC 4.07 (*)    Hemoglobin 12.8 (*)    HCT 38.7 (*)    Neutrophils Relative % 79 (*)    All other components within normal  limits  BASIC METABOLIC PANEL - Abnormal; Notable for the following:    Glucose, Bld 134 (*)    BUN 28 (*)    GFR calc non Af Amer 61 (*)    GFR calc Af Amer 70 (*)    All other components within normal limits  CULTURE, BLOOD (ROUTINE X 2)  CULTURE, BLOOD (ROUTINE X 2)  URINE CULTURE  URINALYSIS, ROUTINE W REFLEX MICROSCOPIC  I-STAT TROPOININ, ED    Imaging Review Dg Chest 2 View  04/17/2014   CLINICAL DATA:  Shortness of breath, respiratory distress  EXAM: CHEST  2 VIEW  COMPARISON:  01/07/2013  FINDINGS: Mild patchy opacities in the right mid/lower lung, favored to reflect scarring when correlating with prior chest radiographs, although right lower lobe pneumonia is not entirely  excluded.  Left basilar atelectasis.  No pleural effusion or pneumothorax.  Cardiomegaly.  Mild degenerative changes of the visualized thoracolumbar spine. Old left rib fracture deformities.  IMPRESSION: Suspected scarring in the right mid/ lower lung, although right lower lobe pneumonia is not entirely excluded.  Left basilar atelectasis.   Electronically Signed   By: Julian Hy M.D.   On: 04/17/2014 23:42     EKG Interpretation None      MDM   Final diagnoses:  Shortness of breath   Patient is a 79 year old male who presents today for evaluation of shortness of breath and cough. Increased oxygen demand, with oxygen saturations in low 90s on 3L. Patient given rocephin and zithromax for CAP. Will discuss with admitting physician about adding coverage for aspiration pneumonia. Vital signs stable at this time.   Discussed case with Dr. Roel Cluck who recommends chest CT. This was added to patient's workup. Admission is appreciated. Vital signs stable at this time.   Medications  cefTRIAXone (ROCEPHIN) 1 g in dextrose 5 % 50 mL IVPB (not administered)  azithromycin (ZITHROMAX) 500 mg in dextrose 5 % 250 mL IVPB (not administered)  azithromycin (ZITHROMAX) 500 mg in dextrose 5 % 250 mL IVPB (not  administered)  cefTRIAXone (ROCEPHIN) 1 g in dextrose 5 % 50 mL IVPB (not administered)  albuterol (PROVENTIL) (2.5 MG/3ML) 0.083% nebulizer solution 5 mg (not administered)      Elwyn Lade, PA-C 04/18/14 [phone]

## 2014-04-18 ENCOUNTER — Encounter (HOSPITAL_COMMUNITY): Payer: Self-pay | Admitting: Internal Medicine

## 2014-04-18 ENCOUNTER — Emergency Department (HOSPITAL_COMMUNITY): Payer: Medicare Other

## 2014-04-18 DIAGNOSIS — R0902 Hypoxemia: Secondary | ICD-10-CM | POA: Diagnosis present

## 2014-04-18 DIAGNOSIS — R0602 Shortness of breath: Secondary | ICD-10-CM

## 2014-04-18 DIAGNOSIS — I517 Cardiomegaly: Secondary | ICD-10-CM

## 2014-04-18 DIAGNOSIS — N390 Urinary tract infection, site not specified: Secondary | ICD-10-CM | POA: Diagnosis present

## 2014-04-18 DIAGNOSIS — E119 Type 2 diabetes mellitus without complications: Secondary | ICD-10-CM

## 2014-04-18 DIAGNOSIS — J189 Pneumonia, unspecified organism: Principal | ICD-10-CM

## 2014-04-18 LAB — MRSA PCR SCREENING: MRSA BY PCR: POSITIVE — AB

## 2014-04-18 LAB — HEMOGLOBIN A1C
Hgb A1c MFr Bld: 4.7 % (ref <5.7)
MEAN PLASMA GLUCOSE: 88 mg/dL (ref <117)

## 2014-04-18 LAB — CBC WITH DIFFERENTIAL/PLATELET
Basophils Absolute: 0 10*3/uL (ref 0.0–0.1)
Basophils Relative: 0 % (ref 0–1)
Eosinophils Absolute: 0 10*3/uL (ref 0.0–0.7)
Eosinophils Relative: 0 % (ref 0–5)
HEMATOCRIT: 34.9 % — AB (ref 39.0–52.0)
Hemoglobin: 11.5 g/dL — ABNORMAL LOW (ref 13.0–17.0)
LYMPHS ABS: 0.4 10*3/uL — AB (ref 0.7–4.0)
Lymphocytes Relative: 6 % — ABNORMAL LOW (ref 12–46)
MCH: 31.2 pg (ref 26.0–34.0)
MCHC: 33 g/dL (ref 30.0–36.0)
MCV: 94.6 fL (ref 78.0–100.0)
MONO ABS: 0.6 10*3/uL (ref 0.1–1.0)
MONOS PCT: 8 % (ref 3–12)
NEUTROS ABS: 6.8 10*3/uL (ref 1.7–7.7)
NEUTROS PCT: 86 % — AB (ref 43–77)
Platelets: 181 10*3/uL (ref 150–400)
RBC: 3.69 MIL/uL — AB (ref 4.22–5.81)
RDW: 13.3 % (ref 11.5–15.5)
WBC: 7.9 10*3/uL (ref 4.0–10.5)

## 2014-04-18 LAB — EXPECTORATED SPUTUM ASSESSMENT W GRAM STAIN, RFLX TO RESP C: Special Requests: NORMAL

## 2014-04-18 LAB — URINE MICROSCOPIC-ADD ON

## 2014-04-18 LAB — TROPONIN I: Troponin I: <0.3 ng/mL (ref <0.30)

## 2014-04-18 LAB — GLUCOSE, CAPILLARY
GLUCOSE-CAPILLARY: 154 mg/dL — AB (ref 70–99)
GLUCOSE-CAPILLARY: 223 mg/dL — AB (ref 70–99)
GLUCOSE-CAPILLARY: 158 mg/dL — AB (ref 70–99)
Glucose-Capillary: 108 mg/dL — ABNORMAL HIGH (ref 70–99)
Glucose-Capillary: 87 mg/dL (ref 70–99)

## 2014-04-18 LAB — COMPREHENSIVE METABOLIC PANEL
ALT: 13 U/L (ref 0–53)
AST: 16 U/L (ref 0–37)
Albumin: 3.5 g/dL (ref 3.5–5.2)
Alkaline Phosphatase: 74 U/L (ref 39–117)
BUN: 25 mg/dL — ABNORMAL HIGH (ref 6–23)
CO2: 27 meq/L (ref 19–32)
Calcium: 8.6 mg/dL (ref 8.4–10.5)
Chloride: 104 mEq/L (ref 96–112)
Creatinine, Ser: 1.22 mg/dL (ref 0.50–1.35)
GFR, EST AFRICAN AMERICAN: 64 mL/min — AB (ref >90)
GFR, EST NON AFRICAN AMERICAN: 55 mL/min — AB (ref >90)
GLUCOSE: 96 mg/dL (ref 70–99)
Potassium: 4.3 mEq/L (ref 3.7–5.3)
SODIUM: 141 meq/L (ref 137–147)
TOTAL PROTEIN: 5.4 g/dL — AB (ref 6.0–8.3)
Total Bilirubin: 0.3 mg/dL (ref 0.3–1.2)

## 2014-04-18 LAB — URINALYSIS, ROUTINE W REFLEX MICROSCOPIC
Bilirubin Urine: NEGATIVE
Glucose, UA: NEGATIVE mg/dL
Hgb urine dipstick: NEGATIVE
KETONES UR: NEGATIVE mg/dL
NITRITE: POSITIVE — AB
Protein, ur: 30 mg/dL — AB
SPECIFIC GRAVITY, URINE: 1.016 (ref 1.005–1.030)
Urobilinogen, UA: 1 mg/dL (ref 0.0–1.0)
pH: 6 (ref 5.0–8.0)

## 2014-04-18 LAB — STREP PNEUMONIAE URINARY ANTIGEN: STREP PNEUMO URINARY ANTIGEN: NEGATIVE

## 2014-04-18 MED ORDER — POLYETHYLENE GLYCOL 3350 17 G PO PACK
17.0000 g | PACK | Freq: Every day | ORAL | Status: DC
  Administered 2014-04-18 – 2014-04-21 (×4): 17 g via ORAL
  Filled 2014-04-18 (×4): qty 1

## 2014-04-18 MED ORDER — CLONAZEPAM 0.5 MG PO TABS
0.5000 mg | ORAL_TABLET | Freq: Every day | ORAL | Status: DC
  Administered 2014-04-18 – 2014-04-20 (×3): 0.5 mg via ORAL
  Filled 2014-04-18 (×3): qty 1

## 2014-04-18 MED ORDER — GLUCERNA SHAKE PO LIQD
237.0000 mL | Freq: Two times a day (BID) | ORAL | Status: DC
  Administered 2014-04-18 – 2014-04-21 (×5): 237 mL via ORAL
  Filled 2014-04-18 (×8): qty 237

## 2014-04-18 MED ORDER — ASPIRIN 81 MG PO CHEW
81.0000 mg | CHEWABLE_TABLET | Freq: Every day | ORAL | Status: DC
  Administered 2014-04-18 – 2014-04-21 (×4): 81 mg via ORAL
  Filled 2014-04-18 (×4): qty 1

## 2014-04-18 MED ORDER — LAMOTRIGINE 150 MG PO TABS
150.0000 mg | ORAL_TABLET | Freq: Two times a day (BID) | ORAL | Status: DC
  Administered 2014-04-18 – 2014-04-21 (×7): 150 mg via ORAL
  Filled 2014-04-18 (×8): qty 1

## 2014-04-18 MED ORDER — VANCOMYCIN HCL IN DEXTROSE 1-5 GM/200ML-% IV SOLN
1000.0000 mg | Freq: Two times a day (BID) | INTRAVENOUS | Status: DC
  Administered 2014-04-18 – 2014-04-20 (×4): 1000 mg via INTRAVENOUS
  Filled 2014-04-18 (×5): qty 200

## 2014-04-18 MED ORDER — METHYLPREDNISOLONE SODIUM SUCC 125 MG IJ SOLR
60.0000 mg | INTRAMUSCULAR | Status: DC
  Administered 2014-04-18: 60 mg via INTRAVENOUS
  Filled 2014-04-18 (×2): qty 0.96
  Filled 2014-04-18: qty 2

## 2014-04-18 MED ORDER — QUETIAPINE FUMARATE 50 MG PO TABS
50.0000 mg | ORAL_TABLET | Freq: Every day | ORAL | Status: DC
  Administered 2014-04-18 – 2014-04-20 (×3): 50 mg via ORAL
  Filled 2014-04-18 (×4): qty 1

## 2014-04-18 MED ORDER — ENOXAPARIN SODIUM 40 MG/0.4ML ~~LOC~~ SOLN
40.0000 mg | SUBCUTANEOUS | Status: DC
  Administered 2014-04-18 – 2014-04-20 (×3): 40 mg via SUBCUTANEOUS
  Filled 2014-04-18 (×5): qty 0.4

## 2014-04-18 MED ORDER — GUAIFENESIN ER 600 MG PO TB12
600.0000 mg | ORAL_TABLET | Freq: Two times a day (BID) | ORAL | Status: DC
  Administered 2014-04-18 – 2014-04-21 (×7): 600 mg via ORAL
  Filled 2014-04-18 (×8): qty 1

## 2014-04-18 MED ORDER — CHLORHEXIDINE GLUCONATE CLOTH 2 % EX PADS
6.0000 | MEDICATED_PAD | Freq: Every day | CUTANEOUS | Status: DC
  Administered 2014-04-18: 6 via TOPICAL

## 2014-04-18 MED ORDER — ENTACAPONE 200 MG PO TABS
200.0000 mg | ORAL_TABLET | Freq: Three times a day (TID) | ORAL | Status: DC
  Administered 2014-04-18 – 2014-04-21 (×10): 200 mg via ORAL
  Filled 2014-04-18 (×12): qty 1

## 2014-04-18 MED ORDER — ALBUTEROL SULFATE (2.5 MG/3ML) 0.083% IN NEBU
2.5000 mg | INHALATION_SOLUTION | RESPIRATORY_TRACT | Status: DC | PRN
  Administered 2014-04-19 (×3): 2.5 mg via RESPIRATORY_TRACT
  Filled 2014-04-18 (×3): qty 3

## 2014-04-18 MED ORDER — TRAZODONE HCL 50 MG PO TABS
50.0000 mg | ORAL_TABLET | Freq: Every day | ORAL | Status: DC
  Administered 2014-04-18 – 2014-04-21 (×4): 50 mg via ORAL
  Filled 2014-04-18 (×4): qty 1

## 2014-04-18 MED ORDER — IOHEXOL 350 MG/ML SOLN
100.0000 mL | Freq: Once | INTRAVENOUS | Status: AC | PRN
  Administered 2014-04-18: 100 mL via INTRAVENOUS

## 2014-04-18 MED ORDER — CARBIDOPA-LEVODOPA 25-250 MG PO TABS
1.0000 | ORAL_TABLET | Freq: Three times a day (TID) | ORAL | Status: DC
  Administered 2014-04-18 – 2014-04-21 (×10): 1 via ORAL
  Filled 2014-04-18 (×12): qty 1

## 2014-04-18 MED ORDER — SERTRALINE HCL 100 MG PO TABS
200.0000 mg | ORAL_TABLET | Freq: Every day | ORAL | Status: DC
  Administered 2014-04-18 – 2014-04-21 (×4): 200 mg via ORAL
  Filled 2014-04-18 (×5): qty 2

## 2014-04-18 MED ORDER — HYDRALAZINE HCL 25 MG PO TABS
25.0000 mg | ORAL_TABLET | Freq: Three times a day (TID) | ORAL | Status: DC
  Administered 2014-04-18 – 2014-04-21 (×10): 25 mg via ORAL
  Filled 2014-04-18 (×12): qty 1

## 2014-04-18 MED ORDER — DEXTROSE 5 % IV SOLN: 1.0000 g | Freq: Every day | INTRAVENOUS | Status: DC

## 2014-04-18 MED ORDER — PIPERACILLIN-TAZOBACTAM 3.375 G IVPB
3.3750 g | Freq: Three times a day (TID) | INTRAVENOUS | Status: DC
  Administered 2014-04-18 – 2014-04-21 (×10): 3.375 g via INTRAVENOUS
  Filled 2014-04-18 (×15): qty 50

## 2014-04-18 MED ORDER — TRAZODONE HCL 100 MG PO TABS
100.0000 mg | ORAL_TABLET | Freq: Every day | ORAL | Status: DC
  Administered 2014-04-18 – 2014-04-20 (×3): 100 mg via ORAL
  Filled 2014-04-18: qty 1
  Filled 2014-04-18 (×2): qty 2
  Filled 2014-04-18: qty 1

## 2014-04-18 MED ORDER — DEXTROSE 5 % IV SOLN: 500.0000 mg | Freq: Every day | INTRAVENOUS | Status: DC

## 2014-04-18 MED ORDER — PHENAZOPYRIDINE HCL 100 MG PO TABS
100.0000 mg | ORAL_TABLET | Freq: Three times a day (TID) | ORAL | Status: DC
  Administered 2014-04-18 – 2014-04-21 (×10): 100 mg via ORAL
  Filled 2014-04-18 (×12): qty 1

## 2014-04-18 MED ORDER — MUPIROCIN 2 % EX OINT
1.0000 "application " | TOPICAL_OINTMENT | Freq: Two times a day (BID) | CUTANEOUS | Status: DC
  Administered 2014-04-18 – 2014-04-21 (×7): 1 via NASAL

## 2014-04-18 MED ORDER — PREDNISONE 10 MG PO TABS
60.0000 mg | ORAL_TABLET | Freq: Every day | ORAL | Status: DC
  Administered 2014-04-19 – 2014-04-21 (×3): 60 mg via ORAL
  Filled 2014-04-18 (×4): qty 1

## 2014-04-18 MED ORDER — PANTOPRAZOLE SODIUM 40 MG PO TBEC
40.0000 mg | DELAYED_RELEASE_TABLET | Freq: Every day | ORAL | Status: DC
  Administered 2014-04-18 – 2014-04-21 (×4): 40 mg via ORAL
  Filled 2014-04-18 (×4): qty 1

## 2014-04-18 MED ORDER — DEXTROSE 5 % IV SOLN
1.0000 g | Freq: Once | INTRAVENOUS | Status: AC
  Administered 2014-04-18: 1 g via INTRAVENOUS
  Filled 2014-04-18: qty 10

## 2014-04-18 MED ORDER — BIOTENE DRY MOUTH MT LIQD
15.0000 mL | Freq: Two times a day (BID) | OROMUCOSAL | Status: DC
  Administered 2014-04-19 – 2014-04-21 (×5): 15 mL via OROMUCOSAL

## 2014-04-18 MED ORDER — SODIUM CHLORIDE 0.9 % IV SOLN
INTRAVENOUS | Status: DC
  Administered 2014-04-18 – 2014-04-19 (×4): via INTRAVENOUS

## 2014-04-18 MED ORDER — AZITHROMYCIN 500 MG IV SOLR
500.0000 mg | Freq: Once | INTRAVENOUS | Status: AC
  Administered 2014-04-18: 500 mg via INTRAVENOUS

## 2014-04-18 MED ORDER — INSULIN ASPART 100 UNIT/ML ~~LOC~~ SOLN: 0.0000 [IU] | Freq: Three times a day (TID) | SUBCUTANEOUS | Status: DC

## 2014-04-18 MED ORDER — ALBUTEROL SULFATE (2.5 MG/3ML) 0.083% IN NEBU
5.0000 mg | INHALATION_SOLUTION | Freq: Once | RESPIRATORY_TRACT | Status: AC
  Administered 2014-04-18: 5 mg via RESPIRATORY_TRACT
  Filled 2014-04-18: qty 6

## 2014-04-18 MED ORDER — IPRATROPIUM BROMIDE 0.02 % IN SOLN
0.5000 mg | Freq: Four times a day (QID) | RESPIRATORY_TRACT | Status: DC
  Administered 2014-04-18 – 2014-04-19 (×6): 0.5 mg via RESPIRATORY_TRACT
  Filled 2014-04-18 (×6): qty 2.5

## 2014-04-18 MED ORDER — AMLODIPINE BESYLATE 5 MG PO TABS
5.0000 mg | ORAL_TABLET | Freq: Every day | ORAL | Status: DC
  Administered 2014-04-18 – 2014-04-21 (×4): 5 mg via ORAL
  Filled 2014-04-18 (×4): qty 1

## 2014-04-18 MED ORDER — INSULIN ASPART 100 UNIT/ML ~~LOC~~ SOLN: 0.0000 [IU] | Freq: Every day | SUBCUTANEOUS | Status: DC

## 2014-04-18 NOTE — Progress Notes (Signed)
Echocardiogram 2D Echocardiogram has been performed.  Doyle Askew 04/18/2014, 1:49 PM

## 2014-04-18 NOTE — ED Notes (Signed)
CT notified of new IV for CT scan

## 2014-04-18 NOTE — Progress Notes (Signed)
[facility] PROGRESS NOTE  [name]. VVO:[id number] DOB: November 09, 1935 DOA: 04/17/2014 PCP: No primary provider on file.  Assessment/Plan: 1. Acute respiratory failure with hypoxia: - secondary to pneumonia and COPD evident on the CT scan. . Started patient on vancomycin and zosyn. He received one dose of IV solumedrol , we will do a quick taper over the next few days. - speech and swallow eval recommended soft diet.  - urine for strepto and legionella pending.  - sputum cultures ordered.    2. UTI: - urine cultures pending and on zosyn.   3. Diabetes Mellitus: - CBG (last 3)  No results found for this basename: GLUCAP,  in the last 72 hours Diet controlled.  Hgba1c is 4.7  Parkinson's Disease: - resume home medications.   Hypertension: controlled. Resume home medications.   Chronic diastolic heart failure: -he is euvolemic. And echo pending.   DVT prophylaxis.     Code Status: full code Family Communication: none at bedside Disposition Plan: transfer to telemetry.    Consultants:  Speech and swallow evaluation.   Procedures:  none  Antibiotics:  Zosyn  Vancomycin.   HPI/Subjective: frstrated to be on the soft diet  Objective: Filed Vitals:   04/18/14 1200  BP:   Pulse:   Temp: 99 F (37.2 C)  Resp:     Intake/Output Summary (Last 24 hours) at 04/18/14 1512 Last data filed at 04/18/14 2694  Gross per 24 hour  Intake 121.25 ml  Output   1250 ml  Net -1128.75 ml   Filed Weights   04/18/14 0615  Weight: 74.4 kg (164 lb 0.4 oz)    Exam:   General:  Alert afebril e comfortable  Cardiovascular: s1s2  Respiratory: ctab  Abdomen: soft NT ND BS+  Musculoskeletal: nopedal edema.   Data Reviewed: Basic Metabolic Panel:  Recent Labs Lab 04/17/14 2316 04/18/14 0646  NA 141 141  K 4.3 4.3  CL 102 104  CO2 24 27  GLUCOSE 134* 96  BUN 28* 25*  CREATININE 1.13 1.22  CALCIUM 9.0 8.6   Liver Function Tests:  Recent  Labs Lab 04/18/14 0646  AST 16  ALT 13  ALKPHOS 74  BILITOT 0.3  PROT 5.4*  ALBUMIN 3.5   No results found for this basename: LIPASE, AMYLASE,  in the last 168 hours No results found for this basename: AMMONIA,  in the last 168 hours CBC:  Recent Labs Lab 04/17/14 2316 04/18/14 0646  WBC 9.0 7.9  NEUTROABS 7.2 6.8  HGB 12.8* 11.5*  HCT 38.7* 34.9*  MCV 95.1 94.6  PLT 167 181   Cardiac Enzymes:  Recent Labs Lab 04/18/14 0646 04/18/14 1145  TROPONINI <0.30 <0.30   BNP (last 3 results)  Recent Labs  04/17/14 2316  PROBNP 1227.0*   CBG: No results found for this basename: GLUCAP,  in the last 168 hours  Recent Results (from the past 240 hour(s))  MRSA PCR SCREENING     Status: Abnormal   Collection Time    04/18/14  6:04 AM      Result Value Ref Range Status   MRSA by PCR POSITIVE (*) NEGATIVE Final   Comment:            The GeneXpert MRSA Assay (FDA     approved for NASAL specimens     only), is one component of a     comprehensive MRSA colonization     surveillance program. It is not     intended  to diagnose MRSA     infection nor to guide or     monitor treatment for     MRSA infections.     RESULT CALLED TO, READ BACK BY AND VERIFIED WITH:     A MABRY AT 0731 ON 05.02.2015 BY NBROOKS  CULTURE, EXPECTORATED SPUTUM-ASSESSMENT     Status: None   Collection Time    04/18/14  9:19 AM      Result Value Ref Range Status   Specimen Description SPUTUM   Final   Special Requests Normal   Final   Sputum evaluation     Final   Value: THIS SPECIMEN IS ACCEPTABLE. RESPIRATORY CULTURE REPORT TO FOLLOW.   Report Status 04/18/2014 FINAL   Final     Studies: Dg Chest 2 View  04/17/2014   CLINICAL DATA:  Shortness of breath, respiratory distress  EXAM: CHEST  2 VIEW  COMPARISON:  01/07/2013  FINDINGS: Mild patchy opacities in the right mid/lower lung, favored to reflect scarring when correlating with prior chest radiographs, although right lower lobe pneumonia is  not entirely excluded.  Left basilar atelectasis.  No pleural effusion or pneumothorax.  Cardiomegaly.  Mild degenerative changes of the visualized thoracolumbar spine. Old left rib fracture deformities.  IMPRESSION: Suspected scarring in the right mid/ lower lung, although right lower lobe pneumonia is not entirely excluded.  Left basilar atelectasis.   Electronically Signed   By: Julian Hy M.D.   On: 04/17/2014 23:42   Ct Angio Chest Pe W/cm &/or Wo Cm  04/18/2014   CLINICAL DATA:  Increased shortness of breath with productive cough and low oxygen saturation.  EXAM: CT ANGIOGRAPHY CHEST WITH CONTRAST  TECHNIQUE: Multidetector CT imaging of the chest was performed using the standard protocol during bolus administration of intravenous contrast. Multiplanar CT image reconstructions and MIPs were obtained to evaluate the vascular anatomy.  CONTRAST:  184mL OMNIPAQUE IOHEXOL 350 MG/ML SOLN  COMPARISON:  DG CHEST 2 VIEW dated 04/17/2014; CT CHEST W/CM dated 12/10/2012  FINDINGS: Technically adequate study with good opacification of the central and segmental pulmonary arteries. No focal filling defects demonstrated. No evidence of significant pulmonary embolus.  Mild cardiac enlargement with small pericardial effusion. Normal caliber thoracic aorta with calcification. Mediastinal lymph nodes are not pathologically enlarged. Coronary artery calcifications. Esophagus is decompressed. Central bronchial wall thickening. Diffuse emphysematous changes in the lungs with patchy infiltrates and tree in bud changes demonstrated in the right middle lung in both lower lungs. This suggests inflammatory bronchiolitis/early pneumonia. No pleural effusions. Significant improvement in the right lung since previous study. No pneumothorax. Degenerative changes in the shoulders with right greater than left shoulder bursal collections and calcifications consistent with osteochondromatosis. Mild degenerative changes in the spine. No  destructive bone lesions appreciated. Visualized portions of the upper abdominal organs are grossly unremarkable.  Review of the MIP images confirms the above findings.  IMPRESSION: No evidence of significant pulmonary embolus. Changes consistent with respiratory bronchiolitis/early pneumonia.   Electronically Signed   By: Lucienne Capers M.D.   On: 04/18/2014 02:54    Scheduled Meds: . amLODipine  5 mg Oral Daily  . aspirin  81 mg Oral Daily  . carbidopa-levodopa  1 tablet Oral TID  . Chlorhexidine Gluconate Cloth  6 each Topical Q0600  . clonazePAM  0.5 mg Oral QHS  . enoxaparin (LOVENOX) injection  40 mg Subcutaneous Q24H  . entacapone  200 mg Oral TID  . feeding supplement (GLUCERNA SHAKE)  237 mL Oral BID BM  .  guaiFENesin  600 mg Oral BID  . hydrALAZINE  25 mg Oral TID  . insulin aspart  0-5 Units Subcutaneous QHS  . insulin aspart  0-9 Units Subcutaneous TID WC  . ipratropium  0.5 mg Nebulization Q6H  . lamoTRIgine  150 mg Oral BID  . methylPREDNISolone (SOLU-MEDROL) injection  60 mg Intravenous Q24H  . mupirocin ointment  1 application Nasal BID  . pantoprazole  40 mg Oral Q1200  . phenazopyridine  100 mg Oral TID  . piperacillin-tazobactam (ZOSYN)  IV  3.375 g Intravenous 3 times per day  . polyethylene glycol  17 g Oral Daily  . QUEtiapine  50 mg Oral QHS  . sertraline  200 mg Oral Daily  . traZODone  100 mg Oral QHS  . traZODone  50 mg Oral Daily  . vancomycin  1,000 mg Intravenous Q12H   Continuous Infusions: . sodium chloride 75 mL/hr at 04/18/14 0600    Active Problems:   Diabetes mellitus   Parkinson disease   Hypertension   Bipolar disorder   Chronic indwelling Foley catheter   Chronic diastolic congestive heart failure   UTI (lower urinary tract infection)   Hypoxia   PNA (pneumonia)    Time spent: 40 minutes.     Hosie Poisson  [facility] Pager [phone] If 7PM-7AM, please contact night-coverage at [email or web address], password [facility] 04/18/2014,  3:12 PM  LOS: 1 day

## 2014-04-18 NOTE — ED Provider Notes (Signed)
Medical screening examination/treatment/procedure(s) were conducted as a shared visit with non-physician practitioner(s) and myself.  I personally evaluated the patient during the encounter.   EKG Interpretation None     Pt seen with PA.  Pt with sob, hypoxia, h/o aspiration pna.  Significant wheezing on my exam, and room air sats at 88%.  To have another neb, tx for hcap, and admission.  Kalman Drape, MD 04/18/14 [phone]

## 2014-04-18 NOTE — Evaluation (Signed)
Clinical/Bedside Swallow Evaluation Patient Details  Name: [name]. MRN: [id number] Date of Birth: October 15, 1935  Today's Date: 04/18/2014 Time: 1130-1200 SLP Time Calculation (min): 30 min  Past Medical History:  Past Medical History  Diagnosis Date  . Hearing aid worn   . Hypertension   . Parkinson disease   . Pneumonia   . Diabetes mellitus type II   . Arthritis   . Bipolar disorder   . Anxiety   . Depression   . MRSA infection (methicillin-resistant Staphylococcus aureus)   . Chronic indwelling Foley catheter   . C2 cervical fracture     due to pt fall  . SIRS (systemic inflammatory response syndrome)   . Neuromuscular disorder   . Memory loss   . Benign prostate hyperplasia    Past Surgical History:  Past Surgical History  Procedure Laterality Date  . Total hip arthroplasty    . Video bronchoscopy  12/16/2012    Procedure: VIDEO BRONCHOSCOPY;  Surgeon: Melrose Nakayama, MD;  Location: [place];  Service: Thoracic;  Laterality: Right;  . Video assisted thoracoscopy (vats)/empyema  12/16/2012    Procedure: VIDEO ASSISTED THORACOSCOPY (VATS)/EMPYEMA;  Surgeon: Melrose Nakayama, MD;  Location: [place];  Service: Thoracic;  Laterality: Right;   HPI:  Patient is a 79 year old male with history of hypertension, parkinson disease, diabetes, bipolar disorder, anxiety, depression, BPH who presents today with 3 days of gradually worsening shortness of breath. He has had associated productive cough. Patient does not wear oxygen at home and does not have any albuterol inhalers that he takes at home. He has a history of aspiration pneumonia.  BSE ordered to assess risk for aspiration and recommend safest, PO diet due to history of dysphagia and current respiratory status.   MBS completed on 12/19/12 with diet recommendations of mechanical soft and thin liquids.    Assessment / Plan / Recommendation Clinical Impression  BSE completed.  Continues with baseline min to moderate  oral dysphagia marked by delayed oral transit with regular solids.  Delay in initiation with all consistencies.  No outward clinical s/s of aspiration noted throughout evaluation.  Recommend to proceed with dysphagia 3 (mechanical soft) and thin liquid by cup sips only.  Recommend full supervision with all PO's to cue patient to utilize aspiration precautions and swallow strategies PRN.  ST to continue in acute care setting for diet tolerance due to history of dysphagia and current respiratory status.  Completion of objective evaluation of MBS to be determined.      Aspiration Risk  Moderate    Diet Recommendation Dysphagia 3 (Mechanical Soft);Thin liquid   Liquid Administration via: Cup;No straw Medication Administration: Whole meds with liquid Supervision: Patient able to self feed;Full supervision/cueing for compensatory strategies Compensations: Slow rate;Small sips/bites;Multiple dry swallows after each bite/sip Postural Changes and/or Swallow Maneuvers: Seated upright 90 degrees;Upright 30-60 min after meal    Other  Recommendations Oral Care Recommendations: Oral care Q4 per protocol Other Recommendations: Clarify dietary restrictions   Follow Up Recommendations  Skilled Nursing facility;24 hour supervision/assistance    Frequency and Duration min 2x/week  2 weeks       SLP Swallow Goals Please refer to Care Plans for listed goals   Swallow Study Prior Functional Status   Resident of AL with prior history of dysphagia.  POA present during evaluation and report patient on regular consistency and thin liquids.      General Date of Onset: 04/17/14 HPI: Patient is a 79 year  old male with history of hypertension, parkinson disease, diabetes, bipolar disorder, anxiety, depression, BPH who presents today with 3 days of gradually worsening shortness of breath. He has had associated productive cough. Patient does not wear oxygen at home and does not have any albuterol inhalers that he  takes at home. He has a history of aspiration pneumonia, but states he doesn't remember if this feels like that. Patient is a poor historian.  Type of Study: Bedside swallow evaluation Previous Swallow Assessment: BSE: 08/15/12, 11/2012, 04/17/2012   MBS 08/16/12 and 12/19/2012 Diet Prior to this Study: NPO Respiratory Status: Nasal cannula History of Recent Intubation: No Behavior/Cognition: Alert;Cooperative;Pleasant mood;Hard of hearing Oral Cavity - Dentition: Adequate natural dentition Self-Feeding Abilities: Able to feed self;Needs assist Patient Positioning: Upright in bed Baseline Vocal Quality: Clear Volitional Cough: Strong;Wet;Congested Volitional Swallow: Able to elicit    Oral/Motor/Sensory Function Overall Oral Motor/Sensory Function: Impaired at baseline Lingual ROM: Reduced right Lingual Strength: Reduced Lingual Sensation: Reduced Velum: Within Functional Limits Mandible: Within Functional Limits   Ice Chips Ice chips: Not tested   Thin Liquid Thin Liquid: Impaired Presentation: Cup;Spoon Pharyngeal  Phase Impairments: Suspected delayed Swallow    Nectar Thick Nectar Thick Liquid: Not tested   Honey Thick Honey Thick Liquid: Not tested   Puree Puree: Impaired Pharyngeal Phase Impairments: Suspected delayed Swallow   Solid   GO    Solid: Impaired Oral Phase Functional Implications: Oral residue Pharyngeal Phase Impairments: Suspected delayed [name] [place], [place] Chryl Heck Kmya Placide 04/18/2014,1:48 PM

## 2014-04-18 NOTE — ED Notes (Signed)
RN made multiple IV attempts to get another IV above the wrist for CT. Unable to access. Charge RN trying at this time.

## 2014-04-18 NOTE — Progress Notes (Signed)
ANTIBIOTIC CONSULT NOTE - INITIAL  Pharmacy Consult for ceftriaxone, azithromycin Indication: CAP  Allergies  Allergen Reactions  . Cholestatin Other (See Comments)    Per MAR    Patient Measurements:   Adjusted Body Weight:   Vital Signs: Temp: 98.8 F (37.1 C) (05/01 2241) BP: 140/63 mmHg (05/01 2241) Intake/Output from previous day:   Intake/Output from this shift:    Labs:  Recent Labs  04/17/14 2316  WBC 9.0  HGB 12.8*  PLT 167  CREATININE 1.13   The CrCl is unknown because both a height and weight (above a minimum accepted value) are required for this calculation. No results found for this basename: VANCOTROUGH, VANCOPEAK, VANCORANDOM, GENTTROUGH, GENTPEAK, GENTRANDOM, TOBRATROUGH, TOBRAPEAK, TOBRARND, AMIKACINPEAK, AMIKACINTROU, AMIKACIN,  in the last 72 hours   Microbiology: No results found for this or any previous visit (from the past 720 hour(s)).  Medical History: Past Medical History  Diagnosis Date  . Hearing aid worn   . Hypertension   . Parkinson disease   . Pneumonia   . Diabetes mellitus type II   . Arthritis   . Bipolar disorder   . Anxiety   . Depression   . MRSA infection (methicillin-resistant Staphylococcus aureus)   . Chronic indwelling Foley catheter   . C2 cervical fracture     due to pt fall  . SIRS (systemic inflammatory response syndrome)   . Neuromuscular disorder   . Memory loss   . Benign prostate hyperplasia     Medications:  Anti-infectives   Start     Dose/Rate Route Frequency Ordered Stop   04/18/14 2200  azithromycin (ZITHROMAX) 500 mg in dextrose 5 % 250 mL IVPB     500 mg 250 mL/hr over 60 Minutes Intravenous Daily at bedtime 04/18/14 0020     04/18/14 2200  cefTRIAXone (ROCEPHIN) 1 g in dextrose 5 % 50 mL IVPB     1 g 100 mL/hr over 30 Minutes Intravenous Daily at bedtime 04/18/14 0020     04/18/14 0030  cefTRIAXone (ROCEPHIN) 1 g in dextrose 5 % 50 mL IVPB     1 g 100 mL/hr over 30 Minutes Intravenous   Once 04/18/14 0015     04/18/14 0030  azithromycin (ZITHROMAX) 500 mg in dextrose 5 % 250 mL IVPB     500 mg 250 mL/hr over 60 Minutes Intravenous  Once 04/18/14 0015       Assessment: Patient with CAP.  Goal of Therapy:  Ceftriaxone/azithromycin dosed based on manufacturer dosing      Plan: Follow up culture results Ceftriaxone 1gm iv q24hr Azithromycin 500mg  iv q24hr   Noe Gens Tyler Deis. 04/18/2014,12:24 AM

## 2014-04-18 NOTE — H&P (Signed)
PCP:  No primary provider on file.  Neurology: Jannifer Franklin  Chief Complaint:  hypoxia  HPI: [name]. is a 79 y.o. male   has a past medical history of Hearing aid worn; Hypertension; Parkinson disease; Pneumonia; Diabetes mellitus type II; Arthritis; Bipolar disorder; Anxiety; Depression; MRSA infection (methicillin-resistant Staphylococcus aureus); Chronic indwelling Foley catheter; C2 cervical fracture; SIRS (systemic inflammatory response syndrome); Neuromuscular disorder; Memory loss; and Benign prostate hyperplasia.   Presented with  Patient was noted to have O2 sats in 90% on RA  while at Assisted living he required up to 4 L of oxygen to maintain O2 sat >92. He has had a cough for the past 3 days but no fever. Has hx of aspiration PNA 3 years ago. He was given albuterol and Atrovent nebs with some improvement.  Denies any shortness of breath ar chest pain. Patient has hx of parkinson disease and some memory deficits.   Hospitalist was called for admission for Hypoxia  Review of Systems:    Pertinent positives include:  non-productive cough,  Constitutional:  No weight loss, night sweats, Fevers, chills, fatigue, weight loss  HEENT:  No headaches, Difficulty swallowing,Tooth/dental problems,Sore throat,  No sneezing, itching, ear ache, nasal congestion, post nasal drip,  Cardio-vascular:  No chest pain, Orthopnea, PND, anasarca, dizziness, palpitations.no Bilateral lower extremity swelling  GI:  No heartburn, indigestion, abdominal pain, nausea, vomiting, diarrhea, change in bowel habits, loss of appetite, melena, blood in stool, hematemesis Resp:  no shortness of breath at rest. No dyspnea on exertion, No excess mucus, no productive cough, No  No coughing up of blood.No change in color of mucus.No wheezing. Skin:  no rash or lesions. No jaundice GU:  no dysuria, change in color of urine, no urgency or frequency. No straining to urinate.  No flank pain.   Musculoskeletal:  No joint pain or no joint swelling. No decreased range of motion. No back pain.  Psych:  No change in mood or affect. No depression or anxiety. No memory loss.  Neuro: no localizing neurological complaints, no tingling, no weakness, no double vision, no gait abnormality, no slurred speech, no confusion  Otherwise ROS are negative except for above, 10 systems were reviewed  Past Medical History: Past Medical History  Diagnosis Date  . Hearing aid worn   . Hypertension   . Parkinson disease   . Pneumonia   . Diabetes mellitus type II   . Arthritis   . Bipolar disorder   . Anxiety   . Depression   . MRSA infection (methicillin-resistant Staphylococcus aureus)   . Chronic indwelling Foley catheter   . C2 cervical fracture     due to pt fall  . SIRS (systemic inflammatory response syndrome)   . Neuromuscular disorder   . Memory loss   . Benign prostate hyperplasia    Past Surgical History  Procedure Laterality Date  . Total hip arthroplasty    . Video bronchoscopy  12/16/2012    Procedure: VIDEO BRONCHOSCOPY;  Surgeon: Melrose Nakayama, MD;  Location: [facility];  Service: Thoracic;  Laterality: Right;  . Video assisted thoracoscopy (vats)/empyema  12/16/2012    Procedure: VIDEO ASSISTED THORACOSCOPY (VATS)/EMPYEMA;  Surgeon: Melrose Nakayama, MD;  Location: [facility];  Service: Thoracic;  Laterality: Right;     Medications: Prior to Admission medications   Medication Sig Start Date End Date Taking? Authorizing Provider  amLODipine (NORVASC) 5 MG tablet Take 1 tablet (5 mg total) by mouth daily. 02/03/13  Yes Noah Delaine  Short, MD  aspirin 81 MG chewable tablet Chew 81 mg by mouth daily.   Yes Historical Provider, MD  carbidopa-levodopa (SINEMET IR) 25-250 MG per tablet Take 1 tablet by mouth 3 (three) times daily.   Yes Historical Provider, MD  clonazePAM (KLONOPIN) 0.5 MG tablet Take 1 tablet (0.5 mg total) by mouth at bedtime. For anxiety 03/31/14  Yes Kathlee Nations, MD  entacapone (COMTAN) 200 MG tablet Take 200 mg by mouth 3 (three) times daily.   Yes Historical Provider, MD  GLUCERNA (GLUCERNA) LIQD Take 237 mLs by mouth 2 (two) times daily between meals.   Yes Historical Provider, MD  hydrALAZINE (APRESOLINE) 25 MG tablet Take 25 mg by mouth 3 (three) times daily.   Yes Historical Provider, MD  lamoTRIgine (LAMICTAL) 150 MG tablet Take 1 tablet (150 mg total) by mouth 2 (two) times daily. 03/31/14  Yes Kathlee Nations, MD  lisinopril (PRINIVIL,ZESTRIL) 10 MG tablet Take 1 tablet (10 mg total) by mouth daily. 02/03/13  Yes Janece Canterbury, MD  Multiple Vitamin (MULTIVITAMIN WITH MINERALS) TABS tablet Take 1 tablet by mouth daily.   Yes Historical Provider, MD  phenazopyridine (PYRIDIUM) 100 MG tablet Take 100 mg by mouth 3 (three) times daily.   Yes Historical Provider, MD  polyethylene glycol (MIRALAX / GLYCOLAX) packet Take 17 g by mouth daily.   Yes Historical Provider, MD  QUEtiapine (SEROQUEL) 50 MG tablet Take 1 tablet (50 mg total) by mouth at bedtime. 03/31/14  Yes Kathlee Nations, MD  Red Yeast Rice Extract (RED YEAST RICE PO) Take 2 tablets by mouth at bedtime.    Yes Historical Provider, MD  sertraline (ZOLOFT) 100 MG tablet Take 200 mg by mouth daily.   Yes Historical Provider, MD  traZODone (DESYREL) 50 MG tablet Take 50-100 mg by mouth at bedtime. 50 mg in the am and 100mg  at bedtime   Yes Historical Provider, MD  Valerian Root 500 MG CAPS Take 1 capsule by mouth at bedtime.   Yes Historical Provider, MD    Allergies:   Allergies  Allergen Reactions  . Cholestatin Other (See Comments)    Per MAR    Social History:    Lives at assisted living   reports that he has never smoked. He has never used smokeless tobacco. He reports that he does not drink alcohol or use illicit drugs.    Family History: family history includes Cancer in his mother; Depression in his father; Heart disease in his mother.    Physical Exam: Patient  Vitals for the past 24 hrs:  BP Temp Resp SpO2  04/17/14 2241 140/63 mmHg 98.8 F (37.1 C) 22 98 %  04/17/14 2238 - - - 99 %    1. General:  in No Acute distress 2. Psychological: Alert occasionally inapropriate 3. Head/ENT:    Dry Mucous Membranes                          Head Non traumatic, neck supple                            Poor Dentition 4. SKIN:   decreased Skin turgor,  Skin clean Dry and intact no rash 5. Heart: Regular rate and rhythm no Murmur, Rub or gallop 6. Lungs: extensive wheezes or crackles   7. Abdomen: Soft, non-tender, Non distended 8. Lower extremities: no clubbing, cyanosis, or edema 9. Neurologically Grossly  intact, moving all 4 extremities equally 10. MSK: Normal range of motion  body mass index is unknown because there is no weight on file.   Labs on Admission:   Recent Labs  04/17/14 2316  NA 141  K 4.3  CL 102  CO2 24  GLUCOSE 134*  BUN 28*  CREATININE 1.13  CALCIUM 9.0   No results found for this basename: AST, ALT, ALKPHOS, BILITOT, PROT, ALBUMIN,  in the last 72 hours No results found for this basename: LIPASE, AMYLASE,  in the last 72 hours  Recent Labs  04/17/14 2316  WBC 9.0  NEUTROABS 7.2  HGB 12.8*  HCT 38.7*  MCV 95.1  PLT 167   No results found for this basename: CKTOTAL, CKMB, CKMBINDEX, TROPONINI,  in the last 72 hours No results found for this basename: TSH, T4TOTAL, FREET3, T3FREE, THYROIDAB,  in the last 72 hours No results found for this basename: VITAMINB12, FOLATE, FERRITIN, TIBC, IRON, RETICCTPCT,  in the last 72 hours Lab Results  Component Value Date   HGBA1C 5.2 09/07/2012    The CrCl is unknown because both a height and weight (above a minimum accepted value) are required for this calculation. ABG    Component Value Date/Time   PHART 7.391 12/17/2012 0614   HCO3 24.2* 12/17/2012 0614   TCO2 25 01/30/2013 1940   ACIDBASEDEF 1.0 12/17/2012 0614   O2SAT 95.0 12/17/2012 0614     No results found for  this basename: DDIMER     Other results:  I have pearsonaly reviewed this: ECG REPORT  Rate: 101  Rhythm: sinus tachycardia w PVC ST&T Change: no sichemia   UA evidence of UTI  BNP (last 3 results)  Recent Labs  04/17/14 2316  PROBNP 1227.0*    There were no vitals filed for this visit.   Cultures:    Component Value Date/Time   SDES URINE, CATHETERIZED 07/22/2013 2207   [place] NONE 07/22/2013 2207   CULT  Value: STAPHYLOCOCCUS AUREUS Note: RIFAMPIN AND GENTAMICIN SHOULD NOT BE USED AS SINGLE DRUGS FOR TREATMENT OF STAPH INFECTIONS. Performed at [facility] 07/22/2013 2207   REPTSTATUS 07/25/2013 FINAL 07/22/2013 2207         Radiological Exams on Admission: Dg Chest 2 View  04/17/2014   CLINICAL DATA:  Shortness of breath, respiratory distress  EXAM: CHEST  2 VIEW  COMPARISON:  01/07/2013  FINDINGS: Mild patchy opacities in the right mid/lower lung, favored to reflect scarring when correlating with prior chest radiographs, although right lower lobe pneumonia is not entirely excluded.  Left basilar atelectasis.  No pleural effusion or pneumothorax.  Cardiomegaly.  Mild degenerative changes of the visualized thoracolumbar spine. Old left rib fracture deformities.  IMPRESSION: Suspected scarring in the right mid/ lower lung, although right lower lobe pneumonia is not entirely excluded.  Left basilar atelectasis.   Electronically Signed   By: Julian Hy M.D.   On: 04/17/2014 23:42    Chart has been reviewed  Assessment/Plan 79 yo M with HX of parkinson disease and diabetes is also started pneumonia past here the sudden onset of hypoxia Chest x-ray worrisome for possible pneumonia Present on Admission:  . Hypoxia etiology unclear patient has no diagnoses of COPD. Has history of aspiration pneumonia in the past we'll obtain CT angiogram of chest to rule out PE as well as evaluate for for pneumonia. For now cover with Zosyn for coverage of aspiration pneumonia.  We'll have speech pathology evaluation in the morning  .  UTI (lower urinary tract infection) should be covered with Zosyn await results of urine culture  . Diabetes mellitus sliding scale  . Parkinson disease continue Sinemet  . Hypertension continue home meds  . Bipolar disorder continue home medications  . Chronic diastolic congestive heart failure - evaluate with echo gram currently appears to be slightly dehydrated  . PNA (pneumonia) possible aspiration pneumonia will cover with Zosyn for now obtain CT of chest to try to clarify   Prophylaxis:  Lovenox, Protonix  CODE STATUS:  FULL CODE  As per patient  Other plan as per orders.  I have spent a total of 55 min on this admission  Aaliyha Mumford 04/18/2014, 1:45 AM

## 2014-04-18 NOTE — Progress Notes (Signed)
ANTIBIOTIC CONSULT NOTE - INITIAL  Pharmacy Consult for Vancomycin and may adjust abx for renal fxn Indication: r/o pneumonia, UTI  Allergies  Allergen Reactions  . Cholestatin Other (See Comments)    Per [facility]    Patient Measurements: Height: 5\' 11"  (180.3 cm) Weight: 164 lb 0.4 oz (74.4 kg) IBW/kg (Calculated) : 75.3   Vital Signs: Temp: 100 F (37.8 C) (05/02 0800) Temp src: Oral (05/02 0800) BP: 134/63 mmHg (05/02 0600) Pulse Rate: 84 (05/02 0536) Intake/Output from previous day: 05/01 0701 - 05/02 0700 In: 121.3 [I.V.:71.3; IV Piggyback:50] Out: 3710 [Urine:1250] Intake/Output from this shift:    Labs:  Recent Labs  04/17/14 2316 04/18/14 0646  WBC 9.0 7.9  HGB 12.8* 11.5*  PLT 167 181  CREATININE 1.13 1.22   Estimated Creatinine Clearance: 53.4 ml/min (by C-G formula based on Cr of 1.22). No results found for this basename: VANCOTROUGH, Corlis Leak, VANCORANDOM, [place], GENTPEAK, GENTRANDOM, TOBRATROUGH, TOBRAPEAK, TOBRARND, AMIKACINPEAK, AMIKACINTROU, AMIKACIN,  in the last 72 hours   Microbiology: Recent Results (from the past 720 hour(s))  MRSA PCR SCREENING     Status: Abnormal   Collection Time    04/18/14  6:04 AM      Result Value Ref Range Status   MRSA by PCR POSITIVE (*) NEGATIVE Final   Comment:            The GeneXpert MRSA Assay (FDA     approved for NASAL specimens     only), is one component of a     comprehensive MRSA colonization     surveillance program. It is not     intended to diagnose MRSA     infection nor to guide or     monitor treatment for     MRSA infections.     RESULT CALLED TO, READ BACK BY AND VERIFIED WITH:     A MABRY AT 0731 ON 05.02.2015 BY NBROOKS  CULTURE, EXPECTORATED SPUTUM-ASSESSMENT     Status: None   Collection Time    04/18/14  9:19 AM      Result Value Ref Range Status   Specimen Description SPUTUM   Final   Special Requests Normal   Final   Sputum evaluation     Final   Value: THIS SPECIMEN IS  ACCEPTABLE. RESPIRATORY CULTURE REPORT TO FOLLOW.   Report Status 04/18/2014 FINAL   Final    Medical History: Past Medical History  Diagnosis Date  . Hearing aid worn   . Hypertension   . Parkinson disease   . Pneumonia   . Diabetes mellitus type II   . Arthritis   . Bipolar disorder   . Anxiety   . Depression   . MRSA infection (methicillin-resistant Staphylococcus aureus)   . Chronic indwelling Foley catheter   . C2 cervical fracture     due to pt fall  . SIRS (systemic inflammatory response syndrome)   . Neuromuscular disorder   . Memory loss   . Benign prostate hyperplasia     Medications:  Anti-infectives   Start     Dose/Rate Route Frequency Ordered Stop   04/18/14 2200  azithromycin (ZITHROMAX) 500 mg in dextrose 5 % 250 mL IVPB  Status:  Discontinued     500 mg 250 mL/hr over 60 Minutes Intravenous Daily at bedtime 04/18/14 0020 04/18/14 0555   04/18/14 2200  cefTRIAXone (ROCEPHIN) 1 g in dextrose 5 % 50 mL IVPB  Status:  Discontinued     1 g 100 mL/hr over  30 Minutes Intravenous Daily at bedtime 04/18/14 0020 04/18/14 0555   04/18/14 0600  piperacillin-tazobactam (ZOSYN) IVPB 3.375 g     3.375 g 12.5 mL/hr over 240 Minutes Intravenous 3 times per day 04/18/14 0555     04/18/14 0030  cefTRIAXone (ROCEPHIN) 1 g in dextrose 5 % 50 mL IVPB     1 g 100 mL/hr over 30 Minutes Intravenous  Once 04/18/14 0015 04/18/14 0214   04/18/14 0030  azithromycin (ZITHROMAX) 500 mg in dextrose 5 % 250 mL IVPB     500 mg 250 mL/hr over 60 Minutes Intravenous  Once 04/18/14 0015 04/18/14 0214     Assessment: 79yo M admitted with hypoxia. PMH is significant for aspiration pneumonia, chronic foley, MRSA infection, and multiple medical problems. Zosyn started for possible aspiration pneumonia and UTI. Pharmacy is asked to add Vancomycin empirically.   5/2 >> Rocephin x 1 5/2 >> Azithro x 1 5/2 >> Zosyn >> 5/2 >> Vanc >>    Tmax: 100 WBCs: wnl (steroids) Renal: SCr mildly  elevated, CrCl 53CG, 51N   Goal of Therapy:  Vancomycin trough level 15-20 mcg/ml Appropriate antibiotic dosing for renal function; eradication of infection  Plan:   Vancomycin 1g IV q12h.  Zosyn 3.375g IV Q8H infused over 4hrs.  Measure Vanc trough at steady state.  Follow up renal fxn and culture results.  Romeo Rabon, PharmD, pager [phone]. 04/18/2014,12:20 PM.

## 2014-04-19 DIAGNOSIS — I5032 Chronic diastolic (congestive) heart failure: Secondary | ICD-10-CM

## 2014-04-19 DIAGNOSIS — I10 Essential (primary) hypertension: Secondary | ICD-10-CM

## 2014-04-19 DIAGNOSIS — E440 Moderate protein-calorie malnutrition: Secondary | ICD-10-CM | POA: Insufficient documentation

## 2014-04-19 DIAGNOSIS — I509 Heart failure, unspecified: Secondary | ICD-10-CM

## 2014-04-19 LAB — GLUCOSE, CAPILLARY
Glucose-Capillary: 91 mg/dL (ref 70–99)
Glucose-Capillary: 94 mg/dL (ref 70–99)
Glucose-Capillary: 116 mg/dL — ABNORMAL HIGH (ref 70–99)
Glucose-Capillary: 150 mg/dL — ABNORMAL HIGH (ref 70–99)
Glucose-Capillary: 179 mg/dL — ABNORMAL HIGH (ref 70–99)

## 2014-04-19 LAB — BASIC METABOLIC PANEL
BUN: 23 mg/dL (ref 6–23)
CALCIUM: 8.4 mg/dL (ref 8.4–10.5)
CO2: 27 mEq/L (ref 19–32)
Chloride: 108 mEq/L (ref 96–112)
Creatinine, Ser: 1.11 mg/dL (ref 0.50–1.35)
GFR, EST AFRICAN AMERICAN: 72 mL/min — AB (ref >90)
GFR, EST NON AFRICAN AMERICAN: 62 mL/min — AB (ref >90)
GLUCOSE: 104 mg/dL — AB (ref 70–99)
Potassium: 4.5 mEq/L (ref 3.7–5.3)
SODIUM: 143 meq/L (ref 137–147)

## 2014-04-19 MED ORDER — INSULIN ASPART 100 UNIT/ML ~~LOC~~ SOLN: 0.0000 [IU] | Freq: Every day | SUBCUTANEOUS | Status: DC

## 2014-04-19 MED ORDER — INSULIN ASPART 100 UNIT/ML ~~LOC~~ SOLN
0.0000 [IU] | Freq: Three times a day (TID) | SUBCUTANEOUS | Status: DC
  Administered 2014-04-20: 2 [IU] via SUBCUTANEOUS
  Administered 2014-04-20: 3 [IU] via SUBCUTANEOUS

## 2014-04-19 MED ORDER — CHLORHEXIDINE GLUCONATE CLOTH 2 % EX PADS
6.0000 | MEDICATED_PAD | Freq: Every day | CUTANEOUS | Status: DC
  Administered 2014-04-19 – 2014-04-20 (×2): 6 via TOPICAL

## 2014-04-19 MED ORDER — RESOURCE THICKENUP CLEAR PO POWD
ORAL | Status: DC | PRN
  Administered 2014-04-19: 22:00:00 via ORAL
  Filled 2014-04-19: qty 125

## 2014-04-19 NOTE — Progress Notes (Signed)
[facility] PROGRESS NOTE  [name]. KXF:[id number] DOB: Apr 24, 1935 DOA: 04/17/2014 PCP: No primary provider on file.  Assessment/Plan: 1. Acute respiratory failure with hypoxia: - secondary to pneumonia and COPD evident on the CT scan. . Started patient on vancomycin and zosyn. He received one dose of IV solumedrol , we will do a quick taper over the next few days. - speech and swallow eval recommended soft diet. MBS ordered.  - urine for strepto and legionella negative.  - sputum cultures ordered.    2. UTI: - urine cultures pending and on zosyn.   3. Diabetes Mellitus: - CBG (last 3)   Recent Labs  04/18/14 2332 04/19/14 0325 04/19/14 0743  GLUCAP 87 91 94   Diet controlled.  Hgba1c is 4.7  Parkinson's Disease: - resume home medications.   Hypertension: controlled. Resume home medications.   Chronic diastolic heart failure: -he is euvolemic. And echo pending.   DVT prophylaxis.     Code Status: full code Family Communication: none at bedside Disposition Plan: transfer to med surg..    Consultants:  Speech and swallow evaluation.   Procedures:  none  Antibiotics:  Zosyn  Vancomycin.   HPI/Subjective: frstrated to be on the soft diet, he islittle calmer today.   Objective: Filed Vitals:   04/19/14 1200  BP:   Pulse:   Temp: 98.9 F (37.2 C)  Resp:     Intake/Output Summary (Last 24 hours) at 04/19/14 1402 Last data filed at 04/19/14 6967  Gross per 24 hour  Intake 1662.5 ml  Output   1750 ml  Net  -87.5 ml   Filed Weights   04/18/14 0615  Weight: 74.4 kg (164 lb 0.4 oz)    Exam:   General:  Alert afebril e comfortable  Cardiovascular: s1s2  Respiratory: ctab  Abdomen: soft NT ND BS+  Musculoskeletal: nopedal edema.   Data Reviewed: Basic Metabolic Panel:  Recent Labs Lab 04/17/14 2316 04/18/14 0646 04/19/14 0745  NA 141 141 143  K 4.3 4.3 4.5  CL 102 104 108  CO2 24 27 27   GLUCOSE 134* 96 104*   BUN 28* 25* 23  CREATININE 1.13 1.22 1.11  CALCIUM 9.0 8.6 8.4   Liver Function Tests:  Recent Labs Lab 04/18/14 0646  AST 16  ALT 13  ALKPHOS 74  BILITOT 0.3  PROT 5.4*  ALBUMIN 3.5   No results found for this basename: LIPASE, AMYLASE,  in the last 168 hours No results found for this basename: AMMONIA,  in the last 168 hours CBC:  Recent Labs Lab 04/17/14 2316 04/18/14 0646  WBC 9.0 7.9  NEUTROABS 7.2 6.8  HGB 12.8* 11.5*  HCT 38.7* 34.9*  MCV 95.1 94.6  PLT 167 181   Cardiac Enzymes:  Recent Labs Lab 04/18/14 0646 04/18/14 1145 04/18/14 1816  TROPONINI <0.30 <0.30 <0.30   BNP (last 3 results)  Recent Labs  04/17/14 2316  PROBNP 1227.0*   CBG:  Recent Labs Lab 04/18/14 1526 04/18/14 2007 04/18/14 2332 04/19/14 0325 04/19/14 0743  GLUCAP 223* 158* 87 91 94    Recent Results (from the past 240 hour(s))  MRSA PCR SCREENING     Status: Abnormal   Collection Time    04/18/14  6:04 AM      Result Value Ref Range Status   MRSA by PCR POSITIVE (*) NEGATIVE Final   Comment:            The GeneXpert MRSA Assay (FDA  approved for NASAL specimens     only), is one component of a     comprehensive MRSA colonization     surveillance program. It is not     intended to diagnose MRSA     infection nor to guide or     monitor treatment for     MRSA infections.     RESULT CALLED TO, READ BACK BY AND VERIFIED WITH:     A MABRY AT 0731 ON 05.02.2015 BY NBROOKS  CULTURE, EXPECTORATED SPUTUM-ASSESSMENT     Status: None   Collection Time    04/18/14  9:19 AM      Result Value Ref Range Status   Specimen Description SPUTUM   Final   Special Requests Normal   Final   Sputum evaluation     Final   Value: THIS SPECIMEN IS ACCEPTABLE. RESPIRATORY CULTURE REPORT TO FOLLOW.   Report Status 04/18/2014 FINAL   Final  CULTURE, RESPIRATORY (NON-EXPECTORATED)     Status: None   Collection Time    04/18/14  9:19 AM      Result Value Ref Range Status    Specimen Description SPUTUM   Final   Special Requests NONE   Final   Gram Stain     Final   Value: MODERATE WBC PRESENT, PREDOMINANTLY PMN     FEW SQUAMOUS EPITHELIAL CELLS PRESENT     MODERATE GRAM NEGATIVE RODS     MODERATE GRAM POSITIVE COCCI IN PAIRS     IN CLUSTERS FEW GRAM POSITIVE RODS   Culture PENDING   Incomplete   Report Status PENDING   Incomplete     Studies: Dg Chest 2 View  04/17/2014   CLINICAL DATA:  Shortness of breath, respiratory distress  EXAM: CHEST  2 VIEW  COMPARISON:  01/07/2013  FINDINGS: Mild patchy opacities in the right mid/lower lung, favored to reflect scarring when correlating with prior chest radiographs, although right lower lobe pneumonia is not entirely excluded.  Left basilar atelectasis.  No pleural effusion or pneumothorax.  Cardiomegaly.  Mild degenerative changes of the visualized thoracolumbar spine. Old left rib fracture deformities.  IMPRESSION: Suspected scarring in the right mid/ lower lung, although right lower lobe pneumonia is not entirely excluded.  Left basilar atelectasis.   Electronically Signed   By: Julian Hy M.D.   On: 04/17/2014 23:42   Ct Angio Chest Pe W/cm &/or Wo Cm  04/18/2014   CLINICAL DATA:  Increased shortness of breath with productive cough and low oxygen saturation.  EXAM: CT ANGIOGRAPHY CHEST WITH CONTRAST  TECHNIQUE: Multidetector CT imaging of the chest was performed using the standard protocol during bolus administration of intravenous contrast. Multiplanar CT image reconstructions and MIPs were obtained to evaluate the vascular anatomy.  CONTRAST:  128mL OMNIPAQUE IOHEXOL 350 MG/ML SOLN  COMPARISON:  DG CHEST 2 VIEW dated 04/17/2014; CT CHEST W/CM dated 12/10/2012  FINDINGS: Technically adequate study with good opacification of the central and segmental pulmonary arteries. No focal filling defects demonstrated. No evidence of significant pulmonary embolus.  Mild cardiac enlargement with small pericardial effusion. Normal  caliber thoracic aorta with calcification. Mediastinal lymph nodes are not pathologically enlarged. Coronary artery calcifications. Esophagus is decompressed. Central bronchial wall thickening. Diffuse emphysematous changes in the lungs with patchy infiltrates and tree in bud changes demonstrated in the right middle lung in both lower lungs. This suggests inflammatory bronchiolitis/early pneumonia. No pleural effusions. Significant improvement in the right lung since previous study. No pneumothorax. Degenerative changes in the  shoulders with right greater than left shoulder bursal collections and calcifications consistent with osteochondromatosis. Mild degenerative changes in the spine. No destructive bone lesions appreciated. Visualized portions of the upper abdominal organs are grossly unremarkable.  Review of the MIP images confirms the above findings.  IMPRESSION: No evidence of significant pulmonary embolus. Changes consistent with respiratory bronchiolitis/early pneumonia.   Electronically Signed   By: Lucienne Capers M.D.   On: 04/18/2014 02:54    Scheduled Meds: . amLODipine  5 mg Oral Daily  . antiseptic oral rinse  15 mL Mouth Rinse BID  . aspirin  81 mg Oral Daily  . carbidopa-levodopa  1 tablet Oral TID  . Chlorhexidine Gluconate Cloth  6 each Topical Q0600  . clonazePAM  0.5 mg Oral QHS  . enoxaparin (LOVENOX) injection  40 mg Subcutaneous Q24H  . entacapone  200 mg Oral TID  . feeding supplement (GLUCERNA SHAKE)  237 mL Oral BID BM  . guaiFENesin  600 mg Oral BID  . hydrALAZINE  25 mg Oral TID  . ipratropium  0.5 mg Nebulization Q6H  . lamoTRIgine  150 mg Oral BID  . mupirocin ointment  1 application Nasal BID  . pantoprazole  40 mg Oral Q1200  . phenazopyridine  100 mg Oral TID  . piperacillin-tazobactam (ZOSYN)  IV  3.375 g Intravenous 3 times per day  . polyethylene glycol  17 g Oral Daily  . predniSONE  60 mg Oral Q breakfast  . QUEtiapine  50 mg Oral QHS  . sertraline  200  mg Oral Daily  . traZODone  100 mg Oral QHS  . traZODone  50 mg Oral Daily  . vancomycin  1,000 mg Intravenous Q12H   Continuous Infusions: . sodium chloride 75 mL/hr at 04/19/14 1012    Active Problems:   Diabetes mellitus   Parkinson disease   Hypertension   Bipolar disorder   Chronic indwelling Foley catheter   Chronic diastolic congestive heart failure   UTI (lower urinary tract infection)   Hypoxia   PNA (pneumonia)   Malnutrition of moderate degree    Time spent: 40 minutes.     Hosie Poisson  [facility] Pager [phone] If 7PM-7AM, please contact night-coverage at [email or web address], password [facility] 04/19/2014, 2:02 PM  LOS: 2 days

## 2014-04-19 NOTE — Progress Notes (Signed)
Speech Language Pathology Treatment: Dysphagia  Patient Details Name: [name]. MRN: [id number] DOB: 06-Sep-1935 Today's Date: 04/19/2014 Time: 1000-1015 SLP Time Calculation (min): 15 min  Assessment / Plan / Recommendation Clinical Impression  F/u from BSE for diet tolerance of dysphagia 3 and thin liquids.  Max cues required for patient to recall recommended precautions and strategies and reasoning.  Strategies posted at [facility] this date.  Recommend to continue current diet consistency with full supervision with all meals to cue patient as needed to comply with precautions.  Recommend upright position for all meals.  Cup sips only with no straws.  Due to PMH of dysphagia and current respiratory status recommend to proceed with further objective testing to assess risk for aspiration and determine safest, PO diet.  MBS to completed 04/20/14.  ST to continue in Acute Care setting.     HPI HPI: Patient is a 79 year old male with history of hypertension, parkinson disease, diabetes, bipolar disorder, anxiety, depression, BPH who presents today with 3 days of gradually worsening shortness of breath. He has had associated productive cough. Patient does not wear oxygen at home and does not have any albuterol inhalers that he takes at home. He has a history of aspiration pneumonia.  BSE ordered to assess risk for aspiration and recommend safest, PO diet due to history of dysphagia and current respiratory status.   MBS completed on 12/19/12 with diet recommendations of mechanical soft and thin liquids.        SLP Plan  MBS    Recommendations Diet recommendations: Dysphagia 3 (mechanical soft) Liquids provided via: Cup;Straw Medication Administration: Whole meds with liquid Supervision: Patient able to self feed Compensations: Slow rate;Small sips/bites;Multiple dry swallows after each bite/sip;Effortful swallow Postural Changes and/or Swallow Maneuvers: Seated upright 90 degrees;Upright 30-60 min  after meal              Oral Care Recommendations: Oral care Q4 per protocol Follow up Recommendations: Skilled Nursing facility Plan: [place], [place]  Marcille Buffy 04/19/2014, 10:22 AM

## 2014-04-19 NOTE — Progress Notes (Signed)
INITIAL NUTRITION ASSESSMENT  Patient meets the criteria for moderate MALNUTRITION in the context of chronic illness with moderate muscle and subcutaneous fat loss.   DOCUMENTATION CODES Per approved criteria  -Non-severe (moderate) malnutrition in the context of chronic illness   INTERVENTION: 1. Continue Glucerna shakes BID. Each provides 220 kcal, 10 g protein 2. Patient may benefit from diet liberalization to Dysphagia 3 only (without carbohydrate modification) to promote intake since A1c is 4.7.  3. Patient would benefit from assistance with ordering meals.   NUTRITION DIAGNOSIS: Malnutrition  related to history of poor oral intake as evidenced by moderate muscle and subcutaneous fat loss.  Goal: Patient will meet >/=90% of estimated nutrition needs  Monitor:  PO intake, weight, labs  Reason for Assessment: Malnutrition screening tool  79 y.o. male  Admitting Dx: Hypoxia secondary to COPD and pneumonia  ASSESSMENT: 79 year old male patient with history of hypertension, Parkingson disease, diabetes, admitted with hypoxia secondary to COPD and pneumonia. Patient resides at an assisted living facility.   Patient reports a good intake (~75% of meals) and Glucerna shakes BID. However, he desires to regain weight, but has been unable to do so. Weight has been stable for one year. Diabetes in good control, HgbA1c 4.7.   SLP following patient, recommends Dysphagia 3, thin liquids diet.   Patient reports dissatisfication with dietary restrictions, stating that he does not have a restricted diet at facility. Due to low A1c and desire to gain weight, liberalization of diet to Dysphagia 3 without carbohydrate modification may be beneficial. RD provided assistance with meal selection and ordering.   Nutrition Focused Physical Exam:  Subcutaneous Fat:  Orbital Region: Moderate wasting Upper Arm Region: moderate wasting Thoracic and Lumbar Region: n/a  Muscle:  Temple Region:  moderate wasting Clavicle Bone Region: moderate wasting Clavicle and Acromion Bone Region: moderate wasting Scapular Bone Region: n/a Dorsal Hand: moderate wasting Patellar Region: moderate wasting Anterior Thigh Region: moderate wasting Posterior Calf Region: moderate wasting  Edema: n/a    Height: Ht Readings from Last 1 Encounters:  04/18/14 5\' 11"  (1.803 m)    Weight: Wt Readings from Last 1 Encounters:  04/18/14 164 lb 0.4 oz (74.4 kg)    Ideal Body Weight: 172 pounds  % Ideal Body Weight: 95%  Wt Readings from Last 10 Encounters:  04/18/14 164 lb 0.4 oz (74.4 kg)  04/02/14 167 lb (75.751 kg)  03/31/14 164 lb 9.6 oz (74.662 kg)  12/30/13 162 lb 3.2 oz (73.573 kg)  09/30/13 177 lb 6.4 oz (80.468 kg)  08/19/13 162 lb (73.483 kg)  07/09/13 163 lb (73.936 kg)  04/22/13 152 lb 9.6 oz (69.219 kg)  01/31/13 143 lb 4.8 oz (65 kg)  01/07/13 150 lb (68.04 kg)    Usual Body Weight: 165 pounds  % Usual Body Weight: 99%  BMI:  Body mass index is 22.89 kg/(m^2). Patient is normal weight..   Estimated Nutritional Needs: Kcal: 1800-2000 kcal Protein: 85-105 g Fluid: >2.2 L/day  Skin: Intact  Diet Order: Dysphagia 3, Carbohydrate Modified  EDUCATION NEEDS: -No education needs identified at this time   Intake/Output Summary (Last 24 hours) at 04/19/14 1253 Last data filed at 04/19/14 2130  Gross per 24 hour  Intake 1812.5 ml  Output   1750 ml  Net   62.5 ml    Last BM: PTA   Labs:   Recent Labs Lab 04/17/14 2316 04/18/14 0646 04/19/14 0745  NA 141 141 143  K 4.3 4.3 4.5  CL  102 104 108  CO2 24 27 27   BUN 28* 25* 23  CREATININE 1.13 1.22 1.11  CALCIUM 9.0 8.6 8.4  GLUCOSE 134* 96 104*    CBG (last 3)   Recent Labs  04/18/14 2332 04/19/14 0325 04/19/14 0743  GLUCAP 87 91 94    Scheduled Meds: . amLODipine  5 mg Oral Daily  . antiseptic oral rinse  15 mL Mouth Rinse BID  . aspirin  81 mg Oral Daily  . carbidopa-levodopa  1 tablet  Oral TID  . Chlorhexidine Gluconate Cloth  6 each Topical Q0600  . clonazePAM  0.5 mg Oral QHS  . enoxaparin (LOVENOX) injection  40 mg Subcutaneous Q24H  . entacapone  200 mg Oral TID  . feeding supplement (GLUCERNA SHAKE)  237 mL Oral BID BM  . guaiFENesin  600 mg Oral BID  . hydrALAZINE  25 mg Oral TID  . ipratropium  0.5 mg Nebulization Q6H  . lamoTRIgine  150 mg Oral BID  . mupirocin ointment  1 application Nasal BID  . pantoprazole  40 mg Oral Q1200  . phenazopyridine  100 mg Oral TID  . piperacillin-tazobactam (ZOSYN)  IV  3.375 g Intravenous 3 times per day  . polyethylene glycol  17 g Oral Daily  . predniSONE  60 mg Oral Q breakfast  . QUEtiapine  50 mg Oral QHS  . sertraline  200 mg Oral Daily  . traZODone  100 mg Oral QHS  . traZODone  50 mg Oral Daily  . vancomycin  1,000 mg Intravenous Q12H    Continuous Infusions: . sodium chloride 75 mL/hr at 04/19/14 1012    Past Medical History  Diagnosis Date  . Hearing aid worn   . Hypertension   . Parkinson disease   . Pneumonia   . Diabetes mellitus type II   . Arthritis   . Bipolar disorder   . Anxiety   . Depression   . MRSA infection (methicillin-resistant Staphylococcus aureus)   . Chronic indwelling Foley catheter   . C2 cervical fracture     due to pt fall  . SIRS (systemic inflammatory response syndrome)   . Neuromuscular disorder   . Memory loss   . Benign prostate hyperplasia     Past Surgical History  Procedure Laterality Date  . Total hip arthroplasty    . Video bronchoscopy  12/16/2012    Procedure: VIDEO BRONCHOSCOPY;  Surgeon: Melrose Nakayama, MD;  Location: [place];  Service: Thoracic;  Laterality: Right;  . Video assisted thoracoscopy (vats)/empyema  12/16/2012    Procedure: VIDEO ASSISTED THORACOSCOPY (VATS)/EMPYEMA;  Surgeon: Melrose Nakayama, MD;  Location: [place];  Service: Thoracic;  Laterality: Right;    Larey Seat, RD, LDN Pager #: [phone] After-Hours Pager #:  [phone]

## 2014-04-20 ENCOUNTER — Inpatient Hospital Stay (HOSPITAL_COMMUNITY): Payer: Medicare Other

## 2014-04-20 LAB — GLUCOSE, CAPILLARY
Glucose-Capillary: 84 mg/dL (ref 70–99)
Glucose-Capillary: 127 mg/dL — ABNORMAL HIGH (ref 70–99)
Glucose-Capillary: 189 mg/dL — ABNORMAL HIGH (ref 70–99)
Glucose-Capillary: 135 mg/dL — ABNORMAL HIGH (ref 70–99)

## 2014-04-20 LAB — LEGIONELLA ANTIGEN, URINE: LEGIONELLA ANTIGEN, URINE: NEGATIVE

## 2014-04-20 LAB — CULTURE, RESPIRATORY: CULTURE: NORMAL

## 2014-04-20 LAB — CBC
HCT: 35.5 % — ABNORMAL LOW (ref 39.0–52.0)
HEMOGLOBIN: 11.2 g/dL — AB (ref 13.0–17.0)
MCH: 30.4 pg (ref 26.0–34.0)
MCHC: 31.5 g/dL (ref 30.0–36.0)
MCV: 96.5 fL (ref 78.0–100.0)
Platelets: 190 10*3/uL (ref 150–400)
RBC: 3.68 MIL/uL — ABNORMAL LOW (ref 4.22–5.81)
RDW: 13.1 % (ref 11.5–15.5)
WBC: 6.1 10*3/uL (ref 4.0–10.5)

## 2014-04-20 LAB — VANCOMYCIN, TROUGH: Vancomycin Tr: 13.1 ug/mL (ref 10.0–20.0)

## 2014-04-20 LAB — BASIC METABOLIC PANEL
BUN: 25 mg/dL — AB (ref 6–23)
CO2: 25 meq/L (ref 19–32)
CREATININE: 1.02 mg/dL (ref 0.50–1.35)
Calcium: 8.7 mg/dL (ref 8.4–10.5)
Chloride: 105 mEq/L (ref 96–112)
GFR calc Af Amer: 80 mL/min — ABNORMAL LOW (ref >90)
GFR calc non Af Amer: 69 mL/min — ABNORMAL LOW (ref >90)
GLUCOSE: 124 mg/dL — AB (ref 70–99)
Potassium: 4.7 mEq/L (ref 3.7–5.3)
Sodium: 141 mEq/L (ref 137–147)

## 2014-04-20 LAB — URINE CULTURE: Colony Count: >=100000

## 2014-04-20 LAB — CULTURE, RESPIRATORY W GRAM STAIN

## 2014-04-20 MED ORDER — IPRATROPIUM BROMIDE 0.02 % IN SOLN
0.5000 mg | Freq: Three times a day (TID) | RESPIRATORY_TRACT | Status: DC
  Administered 2014-04-20: 0.5 mg via RESPIRATORY_TRACT
  Filled 2014-04-20 (×3): qty 2.5

## 2014-04-20 MED ORDER — VANCOMYCIN HCL 10 G IV SOLR
1250.0000 mg | Freq: Two times a day (BID) | INTRAVENOUS | Status: DC
  Administered 2014-04-20 (×2): 1250 mg via INTRAVENOUS
  Filled 2014-04-20 (×4): qty 1250

## 2014-04-20 MED ORDER — IPRATROPIUM-ALBUTEROL 0.5-2.5 (3) MG/3ML IN SOLN
3.0000 mL | RESPIRATORY_TRACT | Status: DC
  Administered 2014-04-20 (×2): 3 mL via RESPIRATORY_TRACT
  Filled 2014-04-20 (×2): qty 3

## 2014-04-20 MED ORDER — IPRATROPIUM-ALBUTEROL 0.5-2.5 (3) MG/3ML IN SOLN
3.0000 mL | Freq: Three times a day (TID) | RESPIRATORY_TRACT | Status: DC
  Administered 2014-04-21 (×2): 3 mL via RESPIRATORY_TRACT
  Filled 2014-04-20 (×2): qty 3

## 2014-04-20 MED ORDER — LORAZEPAM 2 MG/ML IJ SOLN
1.0000 mg | Freq: Once | INTRAMUSCULAR | Status: AC
Start: 2014-04-20 — End: 2014-04-20
  Administered 2014-04-20: 1 mg via INTRAVENOUS
  Filled 2014-04-20: qty 1

## 2014-04-20 NOTE — Care Management Note (Signed)
PT recommendation for HHPT. Pt from [facility]. Per CSW facility utilizes [facility] from [facility]. Arville Go rep Kirke Corin notified of referral. Pt saturation 88% on ra during ambulation, pt sat 96% with 4L n/c. Gisela  DME rep [place] notified.   Venita Lick Kyler Germer,MSN,RN [phone]

## 2014-04-20 NOTE — Procedures (Signed)
Objective Swallowing Evaluation: Modified Barium Swallowing Study  Patient Details  Name: [name]. MRN: [id number] Date of Birth: [birth year]  Today's Date: 04/20/2014 Time: 6270-3500 SLP Time Calculation (min): 21 min  Past Medical History:  Past Medical History  Diagnosis Date  . Hearing aid worn   . Hypertension   . Parkinson disease   . Pneumonia   . Diabetes mellitus type II   . Arthritis   . Bipolar disorder   . Anxiety   . Depression   . MRSA infection (methicillin-resistant Staphylococcus aureus)   . Chronic indwelling Foley catheter   . C2 cervical fracture     due to pt fall  . SIRS (systemic inflammatory response syndrome)   . Neuromuscular disorder   . Memory loss   . Benign prostate hyperplasia    Past Surgical History:  Past Surgical History  Procedure Laterality Date  . Total hip arthroplasty    . Video bronchoscopy  12/16/2012    Procedure: VIDEO BRONCHOSCOPY;  Surgeon: Melrose Nakayama, MD;  Location: [place];  Service: Thoracic;  Laterality: Right;  . Video assisted thoracoscopy (vats)/empyema  12/16/2012    Procedure: VIDEO ASSISTED THORACOSCOPY (VATS)/EMPYEMA;  Surgeon: Melrose Nakayama, MD;  Location: [place];  Service: Thoracic;  Laterality: Right;   HPI:  Patient is a 79 year old male with history of hypertension, parkinson disease, diabetes, bipolar disorder, anxiety, depression, BPH who presents today with 3 days of gradually worsening shortness of breath. He has had associated productive cough. Patient does not wear oxygen at home and does not have any albuterol inhalers that he takes at home. He has a history of aspiration pneumonia.  BSE ordered to assess risk for aspiration and recommend safest, PO diet due to history of dysphagia and current respiratory status.   MBS completed on 12/19/12 with diet recommendations of mechanical soft and thin liquids with chin tuck posture. BSE completed over the weekend with recommendation for dys3/thin  diet.  MBS indicated per evaluating SLP due to h/o dysphagia and admission with respiratory issue.  CT chest showed improved aeration compared to recent CXR.       Assessment / Plan / Recommendation Clinical Impression  Dysphagia Diagnosis: Mild oral phase dysphagia;Mild pharyngeal phase dysphagia   Clinical impression: Mild oropharyngeal dysphagia that is improved compared to previous MBS 12/2012 when in hospital with respiratory issues.  No aspiration or penetration observed during testing.  Pt with mildly decreased oral bolus propulsion and oral stasis that prematurely spills into pharynx.  Pharyngeal swallow was timely but liquid stasis noted at pyriform sinus without pt awareness (suspect due to osteophyte/decreased UES opening).  Cued dry swallows effective to decrease stasis to trace.  Pt able to orally transit and swallow a barium tablet taken with thin barium without delay.   Recommend pt have a regular/thin diet with precautions to mitigate his dysphagia.  Pt states he does not want a modified diet and he verbalized recommendation to tuck his chin and swallow twice but states he does remember to do it during meals.  SLP did not test chin tuck as pt with h/o C2 fx and it was not clinically necessary.  Occasionally choking on food more than drink prior to admission admitted by pt but he states if he is careful he tolerates intake well.    Advised pt to clinically reasoning for compensations strategies and educated him to other risk factors for aspiration/aspiration pna. Please note, pt with frequent throat clearing at completion of  MBS.    Using teach back, effective compensation strategies reinforced.      Treatment Recommendation  Therapy as outlined in treatment plan below    Diet Recommendation Regular;Thin liquid   Liquid Administration via: Cup;Straw Medication Administration: Whole meds with liquid Compensations: Multiple dry swallows after each bite/sip;Slow rate;Small  sips/bites;Effortful swallow Postural Changes and/or Swallow Maneuvers: Seated upright 90 degrees;Upright 30-60 min after meal    Other  Recommendations Oral Care Recommendations: Oral care BID Other Recommendations: Clarify dietary restrictions   Follow Up Recommendations  Skilled Nursing facility    Frequency and Duration min 2x/week  2 weeks     General Date of Onset: 04/20/14 HPI: Patient is a 79 year old male with history of hypertension, parkinson disease, diabetes, bipolar disorder, anxiety, depression, BPH who presents today with 3 days of gradually worsening shortness of breath. He has had associated productive cough. Patient does not wear oxygen at home and does not have any albuterol inhalers that he takes at home. He has a history of aspiration pneumonia.  BSE ordered to assess risk for aspiration and recommend safest, PO diet due to history of dysphagia and current respiratory status.   MBS completed on 12/19/12 with diet recommendations of mechanical soft and thin liquids with chin tuck posture. BSE completed over the weekend with recommendation for dys3/thin diet.  MBS indicated per evaluating SLP due to h/o dysphagia and admission with respiratory issue.  CT chest showed improved aeration compared to recent CXR.   Type of Study: Modified Barium Swallowing Study Reason for Referral: Objectively evaluate swallowing function Previous Swallow Assessment: BSE: 08/15/12, 11/2012, 04/17/2012   MBS 08/16/12 and 12/19/2012 Diet Prior to this Study: Dysphagia 3 (soft);Thin liquids Temperature Spikes Noted: No Respiratory Status: Nasal cannula Behavior/Cognition: Alert;Cooperative;Decreased sustained attention;Requires cueing;Distractible;Hard of hearing Oral Cavity - Dentition: Adequate natural dentition Self-Feeding Abilities: Able to feed self;Needs assist Patient Positioning: Upright in chair Baseline Vocal Quality: Clear Volitional Cough: Strong Volitional Swallow: Able to  elicit Anatomy:  (appearance of osteophyte at C4-C5, radiologist not present to confirm) Pharyngeal Secretions: Standing secretions in (comment) (pharynx, cleared with intake)    Reason for Referral Objectively evaluate swallowing function   Oral Phase Oral Preparation/Oral Phase Oral Phase: Impaired Oral - Nectar Oral - Nectar Cup: Weak lingual manipulation;Reduced posterior propulsion Oral - Thin Oral - Thin Cup: Weak lingual manipulation;Reduced posterior propulsion Oral - Thin Straw: Weak lingual manipulation;Reduced posterior propulsion Oral - Solids Oral - Puree: Weak lingual manipulation;Reduced posterior propulsion Oral - Regular: Weak lingual manipulation;Reduced posterior propulsion Oral - Pill: Weak lingual manipulation;Reduced posterior propulsion Oral Phase - Comment Oral Phase - Comment: mildly impaired manipulation, decreased propulsion   Pharyngeal Phase Pharyngeal Phase Pharyngeal Phase: Impaired Pharyngeal - Nectar Pharyngeal - Nectar Cup: Pharyngeal residue - pyriform sinuses Pharyngeal - Thin Pharyngeal - Thin Cup: Pharyngeal residue - pyriform sinuses Pharyngeal - Thin Straw: Pharyngeal residue - pyriform sinuses Pharyngeal - Solids Pharyngeal - Puree: Within functional limits Pharyngeal - Regular: Within functional limits Pharyngeal - Pill: Within functional limits Pharyngeal Phase - Comment Pharyngeal Comment: cued dry swallows effectively decrease pharyngeal stasis to minimal amount  Cervical Esophageal Phase    GO    Cervical Esophageal Phase Cervical Esophageal Phase: Impaired Cervical Esophageal Phase - Comment Cervical Esophageal Comment: appearance of mildly delayed clearance with miminal retrograde flow of liquid distally, cued dry swallow effective to facilitate clearance- esophageal precautions recommended         Luanna Salk, [facility] SLP [phone]

## 2014-04-20 NOTE — Progress Notes (Signed)
[facility] PROGRESS NOTE  [name]. NTI:[id number] DOB: March 11, 1935 DOA: 04/17/2014 PCP: No primary provider on file.  Assessment/Plan: 1. Acute respiratory failure with hypoxia: - secondary to pneumonia and COPD evident on the CT scan. . Started patient on vancomycin and zosyn. He received one dose of IV solumedrol , we will do a quick taper over the next few days. - speech and swallow eval recommended soft diet. MBS ordered.  - urine for strepto and legionella negative.  - sputum cultures ordered.    2. UTI: - urine cultures pending and on zosyn.   3. Diabetes Mellitus: - CBG (last 3)   Recent Labs  04/20/14 0826 04/20/14 1213 04/20/14 1722  GLUCAP 84 127* 189*   Diet controlled.  Hgba1c is 4.7  Parkinson's Disease: - resume home medications.   Hypertension: controlled. Resume home medications.   Chronic diastolic heart failure: -he is euvolemic. And echo showed preserved LVEF, and grade 1 diastolic dysfunction seen. No regional wall motion abnormalities. Wall motion was normal.   DVT prophylaxis.     Code Status: full code Family Communication: none at bedside Disposition Plan: transfer to med surg..    Consultants:  Speech and swallow evaluation.   Procedures:  none  Antibiotics:  Zosyn  Vancomycin.   HPI/Subjective: He is pleasant today. Denies any new complaints.   Objective: Filed Vitals:   04/20/14 1731  BP: 123/76  Pulse: 58  Temp: 98 F (36.7 C)  Resp: 20    Intake/Output Summary (Last 24 hours) at 04/20/14 1943 Last data filed at 04/20/14 1903  Gross per 24 hour  Intake    555 ml  Output   1975 ml  Net  -1420 ml   Filed Weights   04/18/14 0615 04/19/14 2115  Weight: 74.4 kg (164 lb 0.4 oz) 75.206 kg (165 lb 12.8 oz)    Exam:   General:  Alert afebril e comfortable  Cardiovascular: s1s2  Respiratory: ctab  Abdomen: soft NT ND BS+  Musculoskeletal: nopedal edema.   Data Reviewed: Basic Metabolic  Panel:  Recent Labs Lab 04/17/14 2316 04/18/14 0646 04/19/14 0745 04/20/14 0114  NA 141 141 143 141  K 4.3 4.3 4.5 4.7  CL 102 104 108 105  CO2 24 27 27 25   GLUCOSE 134* 96 104* 124*  BUN 28* 25* 23 25*  CREATININE 1.13 1.22 1.11 1.02  CALCIUM 9.0 8.6 8.4 8.7   Liver Function Tests:  Recent Labs Lab 04/18/14 0646  AST 16  ALT 13  ALKPHOS 74  BILITOT 0.3  PROT 5.4*  ALBUMIN 3.5   No results found for this basename: LIPASE, AMYLASE,  in the last 168 hours No results found for this basename: AMMONIA,  in the last 168 hours CBC:  Recent Labs Lab 04/17/14 2316 04/18/14 0646 04/20/14 0114  WBC 9.0 7.9 6.1  NEUTROABS 7.2 6.8  --   HGB 12.8* 11.5* 11.2*  HCT 38.7* 34.9* 35.5*  MCV 95.1 94.6 96.5  PLT 167 181 190   Cardiac Enzymes:  Recent Labs Lab 04/18/14 0646 04/18/14 1145 04/18/14 1816  TROPONINI <0.30 <0.30 <0.30   BNP (last 3 results)  Recent Labs  04/17/14 2316  PROBNP 1227.0*   CBG:  Recent Labs Lab 04/19/14 1535 04/19/14 1952 04/20/14 0826 04/20/14 1213 04/20/14 1722  GLUCAP 150* 179* 84 127* 189*    Recent Results (from the past 240 hour(s))  CULTURE, BLOOD (ROUTINE X 2)     Status: None   Collection Time  04/18/14 12:25 AM      Result Value Ref Range Status   Specimen Description BLOOD LEFT ANTECUBITAL   Final   Special Requests BOTTLES DRAWN AEROBIC AND ANAEROBIC 5 MLS EACH   Final   Culture  Setup Time     Final   Value: 04/18/2014 03:29     Performed at [facility]   Culture     Final   Value:        BLOOD CULTURE RECEIVED NO GROWTH TO DATE CULTURE WILL BE HELD FOR 5 DAYS BEFORE ISSUING A FINAL NEGATIVE REPORT     Performed at [facility]   Report Status PENDING   Incomplete  CULTURE, BLOOD (ROUTINE X 2)     Status: None   Collection Time    04/18/14 12:33 AM      Result Value Ref Range Status   Specimen Description BLOOD BLOOD LEFT WRIST   Final   Special Requests BOTTLES DRAWN AEROBIC AND ANAEROBIC  [facility] EACH   Final   Culture  Setup Time     Final   Value: 04/18/2014 03:30     Performed at [facility]   Culture     Final   Value:        BLOOD CULTURE RECEIVED NO GROWTH TO DATE CULTURE WILL BE HELD FOR 5 DAYS BEFORE ISSUING A FINAL NEGATIVE REPORT     Performed at [facility]   Report Status PENDING   Incomplete  URINE CULTURE     Status: None   Collection Time    04/18/14 12:56 AM      Result Value Ref Range Status   Specimen Description URINE, CATHETERIZED   Final   Special Requests NONE   Final   Culture  Setup Time     Final   Value: 04/18/2014 03:28     Performed at [place]     Final   Value: >=100,000 COLONIES/ML     Performed at [facility]   Culture     Final   Value: Multiple bacterial morphotypes present, none predominant. Suggest appropriate recollection if clinically indicated.     Performed at [facility]   Report Status 04/20/2014 FINAL   Final  MRSA PCR SCREENING     Status: Abnormal   Collection Time    04/18/14  6:04 AM      Result Value Ref Range Status   MRSA by PCR POSITIVE (*) NEGATIVE Final   Comment:            The GeneXpert MRSA Assay (FDA     approved for NASAL specimens     only), is one component of a     comprehensive MRSA colonization     surveillance program. It is not     intended to diagnose MRSA     infection nor to guide or     monitor treatment for     MRSA infections.     RESULT CALLED TO, READ BACK BY AND VERIFIED WITH:     A MABRY AT 0731 ON 05.02.2015 BY NBROOKS  CULTURE, EXPECTORATED SPUTUM-ASSESSMENT     Status: None   Collection Time    04/18/14  9:19 AM      Result Value Ref Range Status   Specimen Description SPUTUM   Final   Special Requests Normal   Final   Sputum evaluation     Final   Value: THIS SPECIMEN  IS ACCEPTABLE. RESPIRATORY CULTURE REPORT TO FOLLOW.   Report Status 04/18/2014 FINAL   Final  CULTURE, RESPIRATORY (NON-EXPECTORATED)     Status:  None   Collection Time    04/18/14  9:19 AM      Result Value Ref Range Status   Specimen Description SPUTUM   Final   Special Requests NONE   Final   Gram Stain     Final   Value: MODERATE WBC PRESENT, PREDOMINANTLY PMN     FEW SQUAMOUS EPITHELIAL CELLS PRESENT     MODERATE GRAM NEGATIVE RODS     MODERATE GRAM POSITIVE COCCI IN PAIRS     IN CLUSTERS FEW GRAM POSITIVE RODS   Culture     Final   Value: NORMAL OROPHARYNGEAL FLORA     Performed at [facility]   Report Status 04/20/2014 FINAL   Final     Studies: Dg Swallowing Func-speech Pathology  04/20/2014   Macario Golds, [place]     04/20/2014  2:42 PM Objective Swallowing Evaluation: Modified Barium Swallowing Study   Patient Details  Name: Dwayne Covalt. MRN: [id number] Date of Birth: [birth year]  Today's Date: 04/20/2014 Time: 3810-1751 SLP Time Calculation (min): 21 min  Past Medical History:  Past Medical History  Diagnosis Date  . Hearing aid worn   . Hypertension   . Parkinson disease   . Pneumonia   . Diabetes mellitus type II   . Arthritis   . Bipolar disorder   . Anxiety   . Depression   . MRSA infection (methicillin-resistant Staphylococcus aureus)   . Chronic indwelling Foley catheter   . C2 cervical fracture     due to pt fall  . SIRS (systemic inflammatory response syndrome)   . Neuromuscular disorder   . Memory loss   . Benign prostate hyperplasia    Past Surgical History:  Past Surgical History  Procedure Laterality Date  . Total hip arthroplasty    . Video bronchoscopy  12/16/2012    Procedure: VIDEO BRONCHOSCOPY;  Surgeon: Melrose Nakayama,  MD;  Location: [place];  Service: Thoracic;  Laterality: Right;  . Video assisted thoracoscopy (vats)/empyema  12/16/2012    Procedure: VIDEO ASSISTED THORACOSCOPY (VATS)/EMPYEMA;   Surgeon: Melrose Nakayama, MD;  Location: [place];  Service:  Thoracic;  Laterality: Right;   HPI:  Patient is a 79 year old male with history of hypertension,  parkinson disease, diabetes,  bipolar disorder, anxiety,  depression, BPH who presents today with 3 days of gradually  worsening shortness of breath. He has had associated productive  cough. Patient does not wear oxygen at home and does not have any  albuterol inhalers that he takes at home. He has a history of  aspiration pneumonia.  BSE ordered to assess risk for aspiration  and recommend safest, PO diet due to history of dysphagia and  current respiratory status.   MBS completed on 12/19/12 with diet  recommendations of mechanical soft and thin liquids with chin  tuck posture. BSE completed over the weekend with recommendation  for dys3/thin diet.  MBS indicated per evaluating SLP due to h/o  dysphagia and admission with respiratory issue.  CT chest showed  improved aeration compared to recent CXR.       Assessment / Plan / Recommendation Clinical Impression  Dysphagia Diagnosis: Mild oral phase dysphagia;Mild pharyngeal  phase dysphagia   Clinical impression: Mild oropharyngeal dysphagia that is  improved compared to previous MBS 12/2012 when in hospital with  respiratory issues.  No aspiration or penetration observed during  testing.  Pt with mildly decreased oral bolus propulsion and oral  stasis that prematurely spills into pharynx.  Pharyngeal swallow  was timely but liquid stasis noted at pyriform sinus without pt  awareness (suspect due to osteophyte/decreased UES opening).   Cued dry swallows effective to decrease stasis to trace.  Pt able  to orally transit and swallow a barium tablet taken with thin  barium without delay.   Recommend pt have a regular/thin diet with precautions to  mitigate his dysphagia.  Pt states he does not want a modified  diet and he verbalized recommendation to tuck his chin and  swallow twice but states he does remember to do it during meals.   SLP did not test chin tuck as pt with h/o C2 fx and it was not  clinically necessary.  Occasionally choking on food more than  drink prior to admission admitted by pt  but he states if he is  careful he tolerates intake well.    Advised pt to clinically reasoning for compensations strategies  and educated him to other risk factors for aspiration/aspiration  pna. Please note, pt with frequent throat clearing at completion  of MBS.    Using teach back, effective compensation strategies reinforced.       Treatment Recommendation  Therapy as outlined in treatment plan below    Diet Recommendation Regular;Thin liquid   Liquid Administration via: Cup;Straw Medication Administration: Whole meds with liquid Compensations: Multiple dry swallows after each bite/sip;Slow  rate;Small sips/bites;Effortful swallow Postural Changes and/or Swallow Maneuvers: Seated upright 90  degrees;Upright 30-60 min after meal    Other  Recommendations Oral Care Recommendations: Oral care BID Other Recommendations: Clarify dietary restrictions   Follow Up Recommendations  Skilled Nursing facility    Frequency and Duration min 2x/week  2 weeks     General Date of Onset: 04/20/14 HPI: Patient is a 79 year old male with history of hypertension,  parkinson disease, diabetes, bipolar disorder, anxiety,  depression, BPH who presents today with 3 days of gradually  worsening shortness of breath. He has had associated productive  cough. Patient does not wear oxygen at home and does not have any  albuterol inhalers that he takes at home. He has a history of  aspiration pneumonia.  BSE ordered to assess risk for aspiration  and recommend safest, PO diet due to history of dysphagia and  current respiratory status.   MBS completed on 12/19/12 with diet  recommendations of mechanical soft and thin liquids with chin  tuck posture. BSE completed over the weekend with recommendation  for dys3/thin diet.  MBS indicated per evaluating SLP due to h/o  dysphagia and admission with respiratory issue.  CT chest showed  improved aeration compared to recent CXR.   Type of Study: Modified Barium Swallowing Study Reason for Referral:  Objectively evaluate swallowing function Previous Swallow Assessment: BSE: 08/15/12, 11/2012, 04/17/2012    MBS 08/16/12 and 12/19/2012 Diet Prior to this Study: Dysphagia 3 (soft);Thin liquids Temperature Spikes Noted: No Respiratory Status: Nasal cannula Behavior/Cognition: Alert;Cooperative;Decreased sustained  attention;Requires cueing;Distractible;Hard of hearing Oral Cavity - Dentition: Adequate natural dentition Self-Feeding Abilities: Able to feed self;Needs assist Patient Positioning: Upright in chair Baseline Vocal Quality: Clear Volitional Cough: Strong Volitional Swallow: Able to elicit Anatomy:  (appearance of osteophyte at C4-C5, radiologist not  present to confirm) Pharyngeal Secretions: Standing secretions in (comment) (pharynx,  cleared with intake)    Reason for Referral Objectively  evaluate swallowing function   Oral Phase Oral Preparation/Oral Phase Oral Phase: Impaired Oral - Nectar Oral - Nectar Cup: Weak lingual manipulation;Reduced posterior  propulsion Oral - Thin Oral - Thin Cup: Weak lingual manipulation;Reduced posterior  propulsion Oral - Thin Straw: Weak lingual manipulation;Reduced posterior  propulsion Oral - Solids Oral - Puree: Weak lingual manipulation;Reduced posterior  propulsion Oral - Regular: Weak lingual manipulation;Reduced posterior  propulsion Oral - Pill: Weak lingual manipulation;Reduced posterior  propulsion Oral Phase - Comment Oral Phase - Comment: mildly impaired manipulation, decreased  propulsion   Pharyngeal Phase Pharyngeal Phase Pharyngeal Phase: Impaired Pharyngeal - Nectar Pharyngeal - Nectar Cup: Pharyngeal residue - pyriform sinuses Pharyngeal - Thin Pharyngeal - Thin Cup: Pharyngeal residue - pyriform sinuses Pharyngeal - Thin Straw: Pharyngeal residue - pyriform sinuses Pharyngeal - Solids Pharyngeal - Puree: Within functional limits Pharyngeal - Regular: Within functional limits Pharyngeal - Pill: Within functional limits Pharyngeal Phase - Comment Pharyngeal  Comment: cued dry swallows effectively decrease  pharyngeal stasis to minimal amount  Cervical Esophageal Phase    GO    Cervical Esophageal Phase Cervical Esophageal Phase: Impaired Cervical Esophageal Phase - Comment Cervical Esophageal Comment: appearance of mildly delayed  clearance with miminal retrograde flow of liquid distally, cued  dry swallow effective to facilitate clearance- esophageal  precautions recommended         Luanna Salk, MS [facility] SLP [phone]     Scheduled Meds: . amLODipine  5 mg Oral Daily  . antiseptic oral rinse  15 mL Mouth Rinse BID  . aspirin  81 mg Oral Daily  . carbidopa-levodopa  1 tablet Oral TID  . Chlorhexidine Gluconate Cloth  6 each Topical Q0600  . clonazePAM  0.5 mg Oral QHS  . enoxaparin (LOVENOX) injection  40 mg Subcutaneous Q24H  . entacapone  200 mg Oral TID  . feeding supplement (GLUCERNA SHAKE)  237 mL Oral BID BM  . guaiFENesin  600 mg Oral BID  . hydrALAZINE  25 mg Oral TID  . insulin aspart  0-15 Units Subcutaneous TID WC  . insulin aspart  0-5 Units Subcutaneous QHS  . ipratropium-albuterol  3 mL Nebulization Q4H  . lamoTRIgine  150 mg Oral BID  . mupirocin ointment  1 application Nasal BID  . pantoprazole  40 mg Oral Q1200  . phenazopyridine  100 mg Oral TID  . piperacillin-tazobactam (ZOSYN)  IV  3.375 g Intravenous 3 times per day  . polyethylene glycol  17 g Oral Daily  . predniSONE  60 mg Oral Q breakfast  . QUEtiapine  50 mg Oral QHS  . sertraline  200 mg Oral Daily  . traZODone  100 mg Oral QHS  . traZODone  50 mg Oral Daily  . vancomycin  1,250 mg Intravenous Q12H   Continuous Infusions:    Active Problems:   Diabetes mellitus   Parkinson disease   Hypertension   Bipolar disorder   Chronic indwelling Foley catheter   Chronic diastolic congestive heart failure   UTI (lower urinary tract infection)   Hypoxia   PNA (pneumonia)   Malnutrition of moderate degree    Time spent: 40 minutes.     Hosie Poisson  [facility] Pager [phone] If 7PM-7AM, please contact night-coverage at [email or web address], password [facility] 04/20/2014, 7:43 PM  LOS: 3 days

## 2014-04-20 NOTE — Evaluation (Signed)
Physical Therapy Evaluation Patient Details Name: [name]. MRN: [id number] DOB: 05/21/1935 Today's Date: 04/20/2014   History of Present Illness  pt admitted from ALF with hypoxia  Clinical Impression  Pt adamantly declined to talk about going to rehab. Pt will benefit from PT to address problems listed. Recommend  HHPT safety evaluation if returns to ALF.Pt ambulated on RA x 400' with sats  At 88%. Pt did not feel  Nor appear SOB.    Follow Up Recommendations Home health PT;Supervision/Assistance - 24 hour    Equipment Recommendations  None recommended by PT    Recommendations for Other Services       Precautions / Restrictions Precautions Precautions: Fall      Mobility  Bed Mobility Overal bed mobility: Needs Assistance Bed Mobility: Supine to Sit     Supine to sit: Supervision     General bed mobility comments: no physical assistance  Transfers Overall transfer level: Needs assistance Equipment used: Rolling walker (2 wheeled) Transfers: Sit to/from Stand Sit to Stand: Supervision            Ambulation/Gait Ambulation/Gait assistance: Supervision Ambulation Distance (Feet): 400 Feet Assistive device: Rolling walker (2 wheeled) Gait Pattern/deviations: Step-through pattern;Trunk flexed     General Gait Details: pt manages RW well  Stairs            Wheelchair Mobility    Modified Rankin (Stroke Patients Only)       Balance Overall balance assessment: Needs assistance Sitting-balance support: No upper extremity supported;Feet supported Sitting balance-Leahy Scale: Good     Standing balance support: Bilateral upper extremity supported;During functional activity Standing balance-Leahy Scale: Fair                               Pertinent Vitals/Pain On 4 L sats 96% Amb x 400' sats 88 % on RA.    Home Living Family/patient expects to be discharged to:: Assisted living               Home Equipment: Walker -  4 wheels      Prior Function Level of Independence: Needs assistance      ADL's / Homemaking Assistance Needed: has supervision for shower        Hand Dominance        Extremity/Trunk Assessment   Upper Extremity Assessment: Generalized weakness           Lower Extremity Assessment: Generalized weakness      Cervical / Trunk Assessment: Kyphotic  Communication      Cognition Arousal/Alertness: Awake/alert Behavior During Therapy: WFL for tasks assessed/performed Overall Cognitive Status: Within Functional Limits for tasks assessed                      General Comments      Exercises        Assessment/Plan    PT Assessment Patient needs continued PT services  PT Diagnosis Generalized weakness   PT Problem List Decreased strength;Decreased activity tolerance;Decreased mobility;Decreased safety awareness  PT Treatment Interventions DME instruction;Gait training;Functional mobility training;Therapeutic activities;Patient/family education   PT Goals (Current goals can be found in the Care Plan section) Acute Rehab PT Goals Patient Stated Goal: i will return to [facility]. Don't talk to me about rehab PT Goal Formulation: With patient Time For Goal Achievement: 05/04/14 Potential to Achieve Goals: Good    Frequency Min 3X/week   Barriers to discharge  Co-evaluation               End of Session Equipment Utilized During Treatment: Gait belt Activity Tolerance: Patient tolerated treatment well Patient left: with nursing/sitter in room (in bathroom on toilet) Nurse Communication: Mobility status         Time: 6269-4854 PT Time Calculation (min): 28 min   Charges:   PT Evaluation $Initial PT Evaluation Tier I: 1 Procedure PT Treatments $Gait Training: 23-37 mins   PT G Codes:          Claretha Cooper 04/20/2014, 12:52 PM Tresa Endo PT [phone]

## 2014-04-20 NOTE — Progress Notes (Signed)
Clinical Social Work Department BRIEF PSYCHOSOCIAL ASSESSMENT 04/20/2014  Patient:  [name], [name]     Account Number:  [id number]     Admit date:  04/17/2014  Clinical Social Worker:  Ulyess Blossom  Date/Time:  04/20/2014 03:35 PM  Referred by:  Physician  Date Referred:  04/20/2014 Referred for  ALF Placement   Other Referral:   Interview type:  Patient Other interview type:    PSYCHOSOCIAL DATA Living Status:  FACILITY Admitted from facility:  [facility] Level of care:  Assisted Living Primary support name:  [name] [place]/other/[phone] Primary support relationship to patient:  FRIEND Degree of support available:   unknown at this time    CURRENT CONCERNS Current Concerns  Post-Acute Placement   Other Concerns:    SOCIAL WORK ASSESSMENT / PLAN CSW received referral that pt admitted from [facility].    Per chart, PT evaluated pt and recommend HH PT at ALF.    CSW met with pt at bedside. CSW introduced self and explained role. Pt confirmed that he is a resident at [facility] and plans to return upon discharge. Pt reports that he has been at the facility for one year and is satisfied with the care at ALF. CSW discussed that CSW will speak with [name] and send pt clinicals and assist with pt return when pt medically ready.    CSW contacted [facility] and sent pt clinical information to facility. [name] asked that CSW contact facility when pt medically ready for discharge. Per facility, pt was not on oxygen prior to admission and facility uses [facility] for [facility] services.    CSW notified RNCM of O2 and HH needs.    CSW to continue to follow and facilitate pt discharge needs when pt medically ready for discharge.   Assessment/plan status:  Psychosocial Support/Ongoing Assessment of Needs Other assessment/ plan:   discharge planning   Information/referral to community resources:   Referral back to  [name]    PATIENT'S/FAMILY'S RESPONSE TO PLAN OF CARE: Pt alert and oriented x 4. Pt pleasant and actively engaged in assessment. Pt is pleased with [name] and is eager to return to facility upon discharge.    Alison Murray, MSW, [place] [facility] [phone]

## 2014-04-20 NOTE — Progress Notes (Signed)
ANTIBIOTIC CONSULT NOTE - FOLLOW UP  Pharmacy Consult for Vancomycin Indication: r/o pneumonia, UTI  Allergies  Allergen Reactions  . Cholestatin Other (See Comments)    Per MAR    Patient Measurements: Height: 5\' 11"  (180.3 cm) Weight: 165 lb 12.8 oz (75.206 kg) IBW/kg (Calculated) : 75.3 Adjusted Body Weight:   Vital Signs: Temp: 97.4 F (36.3 C) (05/03 2115) Temp src: Oral (05/03 2115) BP: 144/65 mmHg (05/03 2115) Pulse Rate: 56 (05/03 2115) Intake/Output from previous day: 05/03 0701 - 05/04 0700 In: 1300 [I.V.:1050; IV Piggyback:250] Out: 2050 [Urine:2050] Intake/Output from this shift: Total I/O In: 75 [I.V.:75] Out: -   Labs:  Recent Labs  04/17/14 2316 04/18/14 0646 04/19/14 0745 04/20/14 0114  WBC 9.0 7.9  --  6.1  HGB 12.8* 11.5*  --  11.2*  PLT 167 181  --  190  CREATININE 1.13 1.22 1.11 1.02   Estimated Creatinine Clearance: 64.5 ml/min (by C-G formula based on Cr of 1.02).  Recent Labs  04/20/14 0114  VANCOTROUGH 13.1     Microbiology: Recent Results (from the past 720 hour(s))  MRSA PCR SCREENING     Status: Abnormal   Collection Time    04/18/14  6:04 AM      Result Value Ref Range Status   MRSA by PCR POSITIVE (*) NEGATIVE Final   Comment:            The GeneXpert MRSA Assay (FDA     approved for NASAL specimens     only), is one component of a     comprehensive MRSA colonization     surveillance program. It is not     intended to diagnose MRSA     infection nor to guide or     monitor treatment for     MRSA infections.     RESULT CALLED TO, READ BACK BY AND VERIFIED WITH:     A MABRY AT 0731 ON 05.02.2015 BY NBROOKS  CULTURE, EXPECTORATED SPUTUM-ASSESSMENT     Status: None   Collection Time    04/18/14  9:19 AM      Result Value Ref Range Status   Specimen Description SPUTUM   Final   Special Requests Normal   Final   Sputum evaluation     Final   Value: THIS SPECIMEN IS ACCEPTABLE. RESPIRATORY CULTURE REPORT TO FOLLOW.    Report Status 04/18/2014 FINAL   Final  CULTURE, RESPIRATORY (NON-EXPECTORATED)     Status: None   Collection Time    04/18/14  9:19 AM      Result Value Ref Range Status   Specimen Description SPUTUM   Final   Special Requests NONE   Final   Gram Stain     Final   Value: MODERATE WBC PRESENT, PREDOMINANTLY PMN     FEW SQUAMOUS EPITHELIAL CELLS PRESENT     MODERATE GRAM NEGATIVE RODS     MODERATE GRAM POSITIVE COCCI IN PAIRS     IN CLUSTERS FEW GRAM POSITIVE RODS   Culture PENDING   Incomplete   Report Status PENDING   Incomplete    Anti-infectives   Start     Dose/Rate Route Frequency Ordered Stop   04/20/14 1200  vancomycin (VANCOCIN) 1,250 mg in sodium chloride 0.9 % 250 mL IVPB     1,250 mg 166.7 mL/hr over 90 Minutes Intravenous Every 12 hours 04/20/14 0506     04/18/14 2200  azithromycin (ZITHROMAX) 500 mg in dextrose 5 % 250 mL IVPB  Status:  Discontinued     500 mg 250 mL/hr over 60 Minutes Intravenous Daily at bedtime 04/18/14 0020 04/18/14 0555   04/18/14 2200  cefTRIAXone (ROCEPHIN) 1 g in dextrose 5 % 50 mL IVPB  Status:  Discontinued     1 g 100 mL/hr over 30 Minutes Intravenous Daily at bedtime 04/18/14 0020 04/18/14 0555   04/18/14 1400  vancomycin (VANCOCIN) IVPB 1000 mg/200 mL premix  Status:  Discontinued     1,000 mg 200 mL/hr over 60 Minutes Intravenous Every 12 hours 04/18/14 1222 04/20/14 0506   04/18/14 0600  piperacillin-tazobactam (ZOSYN) IVPB 3.375 g     3.375 g 12.5 mL/hr over 240 Minutes Intravenous 3 times per day 04/18/14 0555     04/18/14 0030  cefTRIAXone (ROCEPHIN) 1 g in dextrose 5 % 50 mL IVPB     1 g 100 mL/hr over 30 Minutes Intravenous  Once 04/18/14 0015 04/18/14 0214   04/18/14 0030  azithromycin (ZITHROMAX) 500 mg in dextrose 5 % 250 mL IVPB     500 mg 250 mL/hr over 60 Minutes Intravenous  Once 04/18/14 0015 04/18/14 0214      Assessment: Patient with vancomycin level low.  Prior doses charted correctly.   Goal of Therapy:   Vancomycin trough level 15-20 mcg/ml  Plan:  Measure antibiotic drug levels at steady state Follow up culture results Change vancomycin to 1250mg  iv q12hr  [employer]. 04/20/2014,5:08 AM

## 2014-04-21 DIAGNOSIS — G20 Parkinson's disease: Secondary | ICD-10-CM

## 2014-04-21 LAB — GLUCOSE, CAPILLARY
Glucose-Capillary: 82 mg/dL (ref 70–99)
Glucose-Capillary: 120 mg/dL — ABNORMAL HIGH (ref 70–99)

## 2014-04-21 MED ORDER — CLONAZEPAM 0.5 MG PO TABS: 0.5000 mg | ORAL_TABLET | Freq: Every day | ORAL | Status: DC

## 2014-04-21 MED ORDER — IPRATROPIUM-ALBUTEROL 0.5-2.5 (3) MG/3ML IN SOLN: 3.0000 mL | Freq: Three times a day (TID) | RESPIRATORY_TRACT | Status: DC

## 2014-04-21 MED ORDER — PANTOPRAZOLE SODIUM 40 MG PO TBEC: 40.0000 mg | DELAYED_RELEASE_TABLET | Freq: Every day | ORAL | Status: DC

## 2014-04-21 MED ORDER — LEVOFLOXACIN 750 MG PO TABS: 750.0000 mg | ORAL_TABLET | Freq: Every day | ORAL | Status: DC

## 2014-04-21 MED ORDER — PREDNISONE (PAK) 10 MG PO TABS
ORAL_TABLET | Freq: Every day | ORAL | Status: DC
Start: 2014-04-21 — End: 2014-07-24

## 2014-04-21 MED ORDER — ALBUTEROL SULFATE (2.5 MG/3ML) 0.083% IN NEBU: 2.5000 mg | INHALATION_SOLUTION | RESPIRATORY_TRACT | Status: DC | PRN

## 2014-04-21 MED ORDER — GUAIFENESIN ER 600 MG PO TB12: 600.0000 mg | ORAL_TABLET | Freq: Two times a day (BID) | ORAL | Status: DC

## 2014-04-21 NOTE — Progress Notes (Signed)
Patient has traditional Medicare.  Please document sats as stated below.   SATURATION QUALIFICATIONS: (This note is used to comply with regulatory documentation for home oxygen)  Patient Saturations on Room Air at Rest = ______%  Patient Saturations on Room Air while Ambulating = ______%  Patient Saturations on ______ Liters of oxygen while Ambulating = ______%  Please briefly explain why patient needs home oxygen:

## 2014-04-21 NOTE — Progress Notes (Signed)
SATURATION QUALIFICATIONS: (This note is used to comply with regulatory documentation for home oxygen)  Patient Saturations on Room Air at Rest = 93%  Patient Saturations on Room Air while Ambulating = 88%  Patient Saturations on zero Liters of oxygen while Ambulating = 100%  Please briefly explain why patient needs home oxygen:  From time to time drops while ambulating.  Needs oxygen prn

## 2014-04-21 NOTE — Discharge Summary (Addendum)
Physician Discharge Summary  [name]. [id number] DOB: July 01, 1935 DOA: 04/17/2014  PCP: No primary provider on file.  Admit date: 04/17/2014 Discharge date: 04/21/2014  Time spent: 30 minutes  Recommendations for Outpatient Follow-up:  1. Follow up with PCP in 2 weeks.  2. Follow up with CXR in 2 weeks.  3. Follow up with PTand RN as recommended.    Discharge Diagnoses:  Active Problems:   Diabetes mellitus   Parkinson disease   Hypertension   Bipolar disorder   Chronic indwelling Foley catheter   Chronic diastolic congestive heart failure   UTI (lower urinary tract infection)   Hypoxia   PNA (pneumonia)   Malnutrition of moderate degree   Discharge Condition: improved.   Diet recommendation: regular diet  Filed Weights   04/18/14 0615 04/19/14 2115  Weight: 74.4 kg (164 lb 0.4 oz) 75.206 kg (165 lb 12.8 oz)    History of present illness:   [name]. is a 79 y.o. male with a past medical history of Hearing aid worn; Hypertension; Parkinson disease; Pneumonia; Diabetes mellitus type II; Arthritis; Bipolar disorder; Anxiety; Depression; MRSA infection (methicillin-resistant Staphylococcus aureus); Chronic indwelling Foley catheter; C2 cervical fracture; SIRS (systemic inflammatory response syndrome); Neuromuscular disorder; Memory loss; and Benign prostate hyperplasia.  Presented with Hypoxia, with  O2 sats in 90% on RA while at Assisted living he required up to 4 L of oxygen to maintain O2 sat >92. He has had a cough for the past 3 days but no fever. Has hx of aspiration PNA 3 years ago. He was given albuterol and Atrovent nebs with some improvement. Patient has hx of parkinson disease and some memory deficits.  Hospitalist was called for admission for Hypoxia. A CT angio was done , revealed early pneumonia. He was started on broad spectrum antibiotics, received 3 days of antibiotics.  Marland Kitchen  Hospital Course:   1. Acute respiratory failure with hypoxia: -  secondary to pneumonia and COPD evident on the CT scan. . Started patient on vancomycin and zosyn. He received one dose of IV solumedrol , we will do a quick taper over the next few days.  - speech and swallow eval recommended soft diet. MBS ordered, recommended regular diet  - urine for strepto and legionella negative.  - sputum cultures ordered, showed normal flora.   2. UTI:  - urine cultures show multiple bacterial morphotypes.   3. Diabetes Mellitus:  - CBG (last 3)   Recent Labs   04/20/14 0826  04/20/14 1213  04/20/14 1722   GLUCAP  84  127*  189*    Diet controlled.  Hgba1c is 4.7  Parkinson's Disease:  - resume home medications.  Hypertension: controlled. Resume home medications.  Chronic diastolic heart failure:  -he is euvolemic. And echo showed preserved LVEF, and grade 1 diastolic dysfunction seen. No regional wall motion abnormalities. Wall motion was normal.   Procedures:  CT angio  Consultations:  None    Discharge Exam: Filed Vitals:   04/21/14 0636  BP: 150/75  Pulse: 50  Temp: 98.2 F (36.8 C)  Resp: 20    General: alert afebrile comfortable Cardiovascular: s1s2 Respiratory: ctab  Discharge Instructions You were cared for by a hospitalist during your hospital stay. If you have any questions about your discharge medications or the care you received while you were in the hospital after you are discharged, you can call the unit and asked to speak with the hospitalist on call if the hospitalist that took  care of you is not available. Once you are discharged, your primary care physician will handle any further medical issues. Please note that NO REFILLS for any discharge medications will be authorized once you are discharged, as it is imperative that you return to your primary care physician (or establish a relationship with a primary care physician if you do not have one) for your aftercare needs so that they can reassess your need for medications and  monitor your lab values.  Discharge Orders   Future Appointments Provider Department Dept Phone   10/02/2014 11:00 AM Kathrynn Ducking, MD [facility] [phone]   Future Orders Complete By Expires   Diet - low sodium heart healthy  As directed    Discharge instructions  As directed        Medication List    STOP taking these medications       phenazopyridine 100 MG tablet  Commonly known as:  PYRIDIUM      TAKE these medications       albuterol (2.5 MG/3ML) 0.083% nebulizer solution  Commonly known as:  PROVENTIL  Take 3 mLs (2.5 mg total) by nebulization every 2 (two) hours as needed for wheezing or shortness of breath.     amLODipine 5 MG tablet  Commonly known as:  NORVASC  Take 1 tablet (5 mg total) by mouth daily.     aspirin 81 MG chewable tablet  Chew 81 mg by mouth daily.     carbidopa-levodopa 25-250 MG per tablet  Commonly known as:  SINEMET IR  Take 1 tablet by mouth 3 (three) times daily.     clonazePAM 0.5 MG tablet  Commonly known as:  KLONOPIN  Take 1 tablet (0.5 mg total) by mouth at bedtime. For anxiety     entacapone 200 MG tablet  Commonly known as:  COMTAN  Take 200 mg by mouth 3 (three) times daily.     GLUCERNA Liqd  Take 237 mLs by mouth 2 (two) times daily between meals.     guaiFENesin 600 MG 12 hr tablet  Commonly known as:  MUCINEX  Take 1 tablet (600 mg total) by mouth 2 (two) times daily.     hydrALAZINE 25 MG tablet  Commonly known as:  APRESOLINE  Take 25 mg by mouth 3 (three) times daily.     ipratropium-albuterol 0.5-2.5 (3) MG/3ML Soln  Commonly known as:  DUONEB  Take 3 mLs by nebulization 3 (three) times daily.     lamoTRIgine 150 MG tablet  Commonly known as:  LAMICTAL  Take 1 tablet (150 mg total) by mouth 2 (two) times daily.     levofloxacin 750 MG tablet  Commonly known as:  LEVAQUIN  Take 1 tablet (750 mg total) by mouth daily.     lisinopril 10 MG tablet  Commonly known as:   PRINIVIL,ZESTRIL  Take 1 tablet (10 mg total) by mouth daily.     multivitamin with minerals Tabs tablet  Take 1 tablet by mouth daily.     pantoprazole 40 MG tablet  Commonly known as:  PROTONIX  Take 1 tablet (40 mg total) by mouth daily at 12 noon.     polyethylene glycol packet  Commonly known as:  MIRALAX / GLYCOLAX  Take 17 g by mouth daily.     predniSONE 10 MG tablet  Commonly known as:  STERAPRED UNI-PAK  - Take by mouth daily. Prednisone 40 mg daily for 2 days followed by   - Prednisone 20  mg daily for 2 days followed by   - Prednisone 10 mg daily for 2 days and stop.     QUEtiapine 50 MG tablet  Commonly known as:  SEROQUEL  Take 1 tablet (50 mg total) by mouth at bedtime.     RED YEAST RICE PO  Take 2 tablets by mouth at bedtime.     sertraline 100 MG tablet  Commonly known as:  ZOLOFT  Take 200 mg by mouth daily.     traZODone 50 MG tablet  Commonly known as:  DESYREL  Take 50-100 mg by mouth at bedtime. 50 mg in the am and 100mg  at bedtime     Valerian Root 500 MG Caps  Take 1 capsule by mouth at bedtime.       Allergies  Allergen Reactions  . Cholestatin Other (See Comments)    Per MAR      The results of significant diagnostics from this hospitalization (including imaging, microbiology, ancillary and laboratory) are listed below for reference.    Significant Diagnostic Studies: Dg Chest 2 View  04/17/2014   CLINICAL DATA:  Shortness of breath, respiratory distress  EXAM: CHEST  2 VIEW  COMPARISON:  01/07/2013  FINDINGS: Mild patchy opacities in the right mid/lower lung, favored to reflect scarring when correlating with prior chest radiographs, although right lower lobe pneumonia is not entirely excluded.  Left basilar atelectasis.  No pleural effusion or pneumothorax.  Cardiomegaly.  Mild degenerative changes of the visualized thoracolumbar spine. Old left rib fracture deformities.  IMPRESSION: Suspected scarring in the right mid/ lower lung,  although right lower lobe pneumonia is not entirely excluded.  Left basilar atelectasis.   Electronically Signed   By: Julian Hy M.D.   On: 04/17/2014 23:42   Ct Angio Chest Pe W/cm &/or Wo Cm  04/18/2014   CLINICAL DATA:  Increased shortness of breath with productive cough and low oxygen saturation.  EXAM: CT ANGIOGRAPHY CHEST WITH CONTRAST  TECHNIQUE: Multidetector CT imaging of the chest was performed using the standard protocol during bolus administration of intravenous contrast. Multiplanar CT image reconstructions and MIPs were obtained to evaluate the vascular anatomy.  CONTRAST:  147mL OMNIPAQUE IOHEXOL 350 MG/ML SOLN  COMPARISON:  DG CHEST 2 VIEW dated 04/17/2014; CT CHEST W/CM dated 12/10/2012  FINDINGS: Technically adequate study with good opacification of the central and segmental pulmonary arteries. No focal filling defects demonstrated. No evidence of significant pulmonary embolus.  Mild cardiac enlargement with small pericardial effusion. Normal caliber thoracic aorta with calcification. Mediastinal lymph nodes are not pathologically enlarged. Coronary artery calcifications. Esophagus is decompressed. Central bronchial wall thickening. Diffuse emphysematous changes in the lungs with patchy infiltrates and tree in bud changes demonstrated in the right middle lung in both lower lungs. This suggests inflammatory bronchiolitis/early pneumonia. No pleural effusions. Significant improvement in the right lung since previous study. No pneumothorax. Degenerative changes in the shoulders with right greater than left shoulder bursal collections and calcifications consistent with osteochondromatosis. Mild degenerative changes in the spine. No destructive bone lesions appreciated. Visualized portions of the upper abdominal organs are grossly unremarkable.  Review of the MIP images confirms the above findings.  IMPRESSION: No evidence of significant pulmonary embolus. Changes consistent with respiratory  bronchiolitis/early pneumonia.   Electronically Signed   By: Lucienne Capers M.D.   On: 04/18/2014 02:54   Dg Swallowing Func-speech Pathology  04/20/2014   Macario Golds, CCC-SLP     04/20/2014  2:42 PM Objective Swallowing Evaluation: Modified Barium Swallowing  Study   Patient Details  Name: [name]. MRN: [id number] Date of Birth: 31-Jan-1935  Today's Date: 04/20/2014 Time: [id number] SLP Time Calculation (min): 21 min  Past Medical History:  Past Medical History  Diagnosis Date  . Hearing aid worn   . Hypertension   . Parkinson disease   . Pneumonia   . Diabetes mellitus type II   . Arthritis   . Bipolar disorder   . Anxiety   . Depression   . MRSA infection (methicillin-resistant Staphylococcus aureus)   . Chronic indwelling Foley catheter   . C2 cervical fracture     due to pt fall  . SIRS (systemic inflammatory response syndrome)   . Neuromuscular disorder   . Memory loss   . Benign prostate hyperplasia    Past Surgical History:  Past Surgical History  Procedure Laterality Date  . Total hip arthroplasty    . Video bronchoscopy  12/16/2012    Procedure: VIDEO BRONCHOSCOPY;  Surgeon: Melrose Nakayama,  MD;  Location: [facility];  Service: Thoracic;  Laterality: Right;  . Video assisted thoracoscopy (vats)/empyema  12/16/2012    Procedure: VIDEO ASSISTED THORACOSCOPY (VATS)/EMPYEMA;   Surgeon: Melrose Nakayama, MD;  Location: [facility];  Service:  Thoracic;  Laterality: Right;   HPI:  Patient is a 79 year old male with history of hypertension,  parkinson disease, diabetes, bipolar disorder, anxiety,  depression, BPH who presents today with 3 days of gradually  worsening shortness of breath. He has had associated productive  cough. Patient does not wear oxygen at home and does not have any  albuterol inhalers that he takes at home. He has a history of  aspiration pneumonia.  BSE ordered to assess risk for aspiration  and recommend safest, PO diet due to history of dysphagia and  current respiratory  status.   MBS completed on 12/19/12 with diet  recommendations of mechanical soft and thin liquids with chin  tuck posture. BSE completed over the weekend with recommendation  for dys3/thin diet.  MBS indicated per evaluating SLP due to h/o  dysphagia and admission with respiratory issue.  CT chest showed  improved aeration compared to recent CXR.       Assessment / Plan / Recommendation Clinical Impression  Dysphagia Diagnosis: Mild oral phase dysphagia;Mild pharyngeal  phase dysphagia   Clinical impression: Mild oropharyngeal dysphagia that is  improved compared to previous MBS 12/2012 when in hospital with  respiratory issues.  No aspiration or penetration observed during  testing.  Pt with mildly decreased oral bolus propulsion and oral  stasis that prematurely spills into pharynx.  Pharyngeal swallow  was timely but liquid stasis noted at pyriform sinus without pt  awareness (suspect due to osteophyte/decreased UES opening).   Cued dry swallows effective to decrease stasis to trace.  Pt able  to orally transit and swallow a barium tablet taken with thin  barium without delay.   Recommend pt have a regular/thin diet with precautions to  mitigate his dysphagia.  Pt states he does not want a modified  diet and he verbalized recommendation to tuck his chin and  swallow twice but states he does remember to do it during meals.   SLP did not test chin tuck as pt with h/o C2 fx and it was not  clinically necessary.  Occasionally choking on food more than  drink prior to admission admitted by pt but he states if he is  careful he tolerates intake well.  Advised pt to clinically reasoning for compensations strategies  and educated him to other risk factors for aspiration/aspiration  pna. Please note, pt with frequent throat clearing at completion  of MBS.    Using teach back, effective compensation strategies reinforced.       Treatment Recommendation  Therapy as outlined in treatment plan below    Diet Recommendation  Regular;Thin liquid   Liquid Administration via: Cup;Straw Medication Administration: Whole meds with liquid Compensations: Multiple dry swallows after each bite/sip;Slow  rate;Small sips/bites;Effortful swallow Postural Changes and/or Swallow Maneuvers: Seated upright 90  degrees;Upright 30-60 min after meal    Other  Recommendations Oral Care Recommendations: Oral care BID Other Recommendations: Clarify dietary restrictions   Follow Up Recommendations  Skilled Nursing facility    Frequency and Duration min 2x/week  2 weeks     General Date of Onset: 04/20/14 HPI: Patient is a 79 year old male with history of hypertension,  parkinson disease, diabetes, bipolar disorder, anxiety,  depression, BPH who presents today with 3 days of gradually  worsening shortness of breath. He has had associated productive  cough. Patient does not wear oxygen at home and does not have any  albuterol inhalers that he takes at home. He has a history of  aspiration pneumonia.  BSE ordered to assess risk for aspiration  and recommend safest, PO diet due to history of dysphagia and  current respiratory status.   MBS completed on 12/19/12 with diet  recommendations of mechanical soft and thin liquids with chin  tuck posture. BSE completed over the weekend with recommendation  for dys3/thin diet.  MBS indicated per evaluating SLP due to h/o  dysphagia and admission with respiratory issue.  CT chest showed  improved aeration compared to recent CXR.   Type of Study: Modified Barium Swallowing Study Reason for Referral: Objectively evaluate swallowing function Previous Swallow Assessment: BSE: 08/15/12, 11/2012, 04/17/2012    MBS 08/16/12 and 12/19/2012 Diet Prior to this Study: Dysphagia 3 (soft);Thin liquids Temperature Spikes Noted: No Respiratory Status: Nasal cannula Behavior/Cognition: Alert;Cooperative;Decreased sustained  attention;Requires cueing;Distractible;Hard of hearing Oral Cavity - Dentition: Adequate natural dentition Self-Feeding  Abilities: Able to feed self;Needs assist Patient Positioning: Upright in chair Baseline Vocal Quality: Clear Volitional Cough: Strong Volitional Swallow: Able to elicit Anatomy:  (appearance of osteophyte at C4-C5, radiologist not  present to confirm) Pharyngeal Secretions: Standing secretions in (comment) (pharynx,  cleared with intake)    Reason for Referral Objectively evaluate swallowing function   Oral Phase Oral Preparation/Oral Phase Oral Phase: Impaired Oral - Nectar Oral - Nectar Cup: Weak lingual manipulation;Reduced posterior  propulsion Oral - Thin Oral - Thin Cup: Weak lingual manipulation;Reduced posterior  propulsion Oral - Thin Straw: Weak lingual manipulation;Reduced posterior  propulsion Oral - Solids Oral - Puree: Weak lingual manipulation;Reduced posterior  propulsion Oral - Regular: Weak lingual manipulation;Reduced posterior  propulsion Oral - Pill: Weak lingual manipulation;Reduced posterior  propulsion Oral Phase - Comment Oral Phase - Comment: mildly impaired manipulation, decreased  propulsion   Pharyngeal Phase Pharyngeal Phase Pharyngeal Phase: Impaired Pharyngeal - Nectar Pharyngeal - Nectar Cup: Pharyngeal residue - pyriform sinuses Pharyngeal - Thin Pharyngeal - Thin Cup: Pharyngeal residue - pyriform sinuses Pharyngeal - Thin Straw: Pharyngeal residue - pyriform sinuses Pharyngeal - Solids Pharyngeal - Puree: Within functional limits Pharyngeal - Regular: Within functional limits Pharyngeal - Pill: Within functional limits Pharyngeal Phase - Comment Pharyngeal Comment: cued dry swallows effectively decrease  pharyngeal stasis to minimal amount  Cervical Esophageal Phase  GO    Cervical Esophageal Phase Cervical Esophageal Phase: Impaired Cervical Esophageal Phase - Comment Cervical Esophageal Comment: appearance of mildly delayed  clearance with miminal retrograde flow of liquid distally, cued  dry swallow effective to facilitate clearance- esophageal  precautions recommended          Luanna Salk, [facility] SLP [phone]     Microbiology: Recent Results (from the past 240 hour(s))  CULTURE, BLOOD (ROUTINE X 2)     Status: None   Collection Time    04/18/14 12:25 AM      Result Value Ref Range Status   Specimen Description BLOOD LEFT ANTECUBITAL   Final   Special Requests BOTTLES DRAWN AEROBIC AND ANAEROBIC 5 MLS EACH   Final   Culture  Setup Time     Final   Value: 04/18/2014 03:29     Performed at [facility]   Culture     Final   Value:        BLOOD CULTURE RECEIVED NO GROWTH TO DATE CULTURE WILL BE HELD FOR 5 DAYS BEFORE ISSUING A FINAL NEGATIVE REPORT     Performed at [facility]   Report Status PENDING   Incomplete  CULTURE, BLOOD (ROUTINE X 2)     Status: None   Collection Time    04/18/14 12:33 AM      Result Value Ref Range Status   Specimen Description BLOOD BLOOD LEFT WRIST   Final   Special Requests BOTTLES DRAWN AEROBIC AND ANAEROBIC [facility] EACH   Final   Culture  Setup Time     Final   Value: 04/18/2014 03:30     Performed at [facility]   Culture     Final   Value:        BLOOD CULTURE RECEIVED NO GROWTH TO DATE CULTURE WILL BE HELD FOR 5 DAYS BEFORE ISSUING A FINAL NEGATIVE REPORT     Performed at [facility]   Report Status PENDING   Incomplete  URINE CULTURE     Status: None   Collection Time    04/18/14 12:56 AM      Result Value Ref Range Status   Specimen Description URINE, CATHETERIZED   Final   Special Requests NONE   Final   Culture  Setup Time     Final   Value: 04/18/2014 03:28     Performed at [facility]     Final   Value: >=100,000 COLONIES/ML     Performed at [facility]   Culture     Final   Value: Multiple bacterial morphotypes present, none predominant. Suggest appropriate recollection if clinically indicated.     Performed at [facility]   Report Status 04/20/2014 FINAL   Final  MRSA PCR SCREENING     Status: Abnormal   Collection Time     04/18/14  6:04 AM      Result Value Ref Range Status   MRSA by PCR POSITIVE (*) NEGATIVE Final   Comment:            The GeneXpert MRSA Assay (FDA     approved for NASAL specimens     only), is one component of a     comprehensive MRSA colonization     surveillance program. It is not     intended to diagnose MRSA     infection nor to guide or     monitor treatment for  MRSA infections.     RESULT CALLED TO, READ BACK BY AND VERIFIED WITH:     A MABRY AT 0731 ON 05.02.2015 BY NBROOKS  CULTURE, EXPECTORATED SPUTUM-ASSESSMENT     Status: None   Collection Time    04/18/14  9:19 AM      Result Value Ref Range Status   Specimen Description SPUTUM   Final   Special Requests Normal   Final   Sputum evaluation     Final   Value: THIS SPECIMEN IS ACCEPTABLE. RESPIRATORY CULTURE REPORT TO FOLLOW.   Report Status 04/18/2014 FINAL   Final  CULTURE, RESPIRATORY (NON-EXPECTORATED)     Status: None   Collection Time    04/18/14  9:19 AM      Result Value Ref Range Status   Specimen Description SPUTUM   Final   Special Requests NONE   Final   Gram Stain     Final   Value: MODERATE WBC PRESENT, PREDOMINANTLY PMN     FEW SQUAMOUS EPITHELIAL CELLS PRESENT     MODERATE GRAM NEGATIVE RODS     MODERATE GRAM POSITIVE COCCI IN PAIRS     IN CLUSTERS FEW GRAM POSITIVE RODS   Culture     Final   Value: NORMAL OROPHARYNGEAL FLORA     Performed at [facility]   Report Status 04/20/2014 FINAL   Final     Labs: Basic Metabolic Panel:  Recent Labs Lab 04/17/14 2316 04/18/14 0646 04/19/14 0745 04/20/14 0114  NA 141 141 143 141  K 4.3 4.3 4.5 4.7  CL 102 104 108 105  CO2 24 27 27 25   GLUCOSE 134* 96 104* 124*  BUN 28* 25* 23 25*  CREATININE 1.13 1.22 1.11 1.02  CALCIUM 9.0 8.6 8.4 8.7   Liver Function Tests:  Recent Labs Lab 04/18/14 0646  AST 16  ALT 13  ALKPHOS 74  BILITOT 0.3  PROT 5.4*  ALBUMIN 3.5   No results found for this basename: LIPASE, AMYLASE,  in the  last 168 hours No results found for this basename: AMMONIA,  in the last 168 hours CBC:  Recent Labs Lab 04/17/14 2316 04/18/14 0646 04/20/14 0114  WBC 9.0 7.9 6.1  NEUTROABS 7.2 6.8  --   HGB 12.8* 11.5* 11.2*  HCT 38.7* 34.9* 35.5*  MCV 95.1 94.6 96.5  PLT 167 181 190   Cardiac Enzymes:  Recent Labs Lab 04/18/14 0646 04/18/14 1145 04/18/14 1816  TROPONINI <0.30 <0.30 <0.30   BNP: BNP (last 3 results)  Recent Labs  04/17/14 2316  PROBNP 1227.0*   CBG:  Recent Labs Lab 04/20/14 0826 04/20/14 1213 04/20/14 1722 04/20/14 2156 04/21/14 0750  GLUCAP 84 127* 189* 135* 82       Signed:  Breella Vanostrand  [facility] 04/21/2014, 11:12 AM

## 2014-04-21 NOTE — Care Management Note (Signed)
Cm notified by [facility] dme rep, patient dies not qualify for home oxygen per Medicare regulations. Bedside RN Kim P. Notified prior to discharge.    Venita Lick Dynastee Brummell,MSN,RN [phone]

## 2014-04-21 NOTE — Progress Notes (Signed)
Pt for discharge to [facility].  CSW facilitated pt discharge needs including contacting facility, faxing pt discharge information to facility, confirming facility received and reviewed discharge information, discussing with pt at bedside, and assisting pt with contacting his caregiver, [name] who is agreeable to transporting pt via private vehicle back to [facility]. CSW provided discharge packet in wall-a-roo for RN to provide to pt and pt caregiver upon pt discharge.  No further social work needs identified at this time.  CSW signing off.   Alison Murray, MSW, [place] [facility] [phone]

## 2014-04-24 LAB — CULTURE, BLOOD (ROUTINE X 2)
Culture: NO GROWTH
Culture: NO GROWTH

## 2014-07-01 ENCOUNTER — Telehealth (HOSPITAL_COMMUNITY): Payer: Self-pay

## 2014-07-01 ENCOUNTER — Encounter (HOSPITAL_COMMUNITY): Payer: Self-pay | Admitting: Psychiatry

## 2014-07-01 ENCOUNTER — Ambulatory Visit (INDEPENDENT_AMBULATORY_CARE_PROVIDER_SITE_OTHER): Payer: Medicare Other | Admitting: Psychiatry

## 2014-07-01 VITALS — BP 117/60 | HR 52 | Ht 70.0 in | Wt 165.0 lb

## 2014-07-01 DIAGNOSIS — F319 Bipolar disorder, unspecified: Secondary | ICD-10-CM

## 2014-07-01 MED ORDER — SERTRALINE HCL 100 MG PO TABS: 200.0000 mg | ORAL_TABLET | Freq: Every day | ORAL | Status: DC

## 2014-07-01 MED ORDER — CLONAZEPAM 0.5 MG PO TABS: 0.5000 mg | ORAL_TABLET | Freq: Every day | ORAL | Status: DC

## 2014-07-01 MED ORDER — LAMOTRIGINE 150 MG PO TABS: 150.0000 mg | ORAL_TABLET | Freq: Two times a day (BID) | ORAL | Status: DC

## 2014-07-01 MED ORDER — TRAZODONE HCL 50 MG PO TABS: 50.0000 mg | ORAL_TABLET | Freq: Every day | ORAL | Status: DC

## 2014-07-01 MED ORDER — QUETIAPINE FUMARATE 50 MG PO TABS: 50.0000 mg | ORAL_TABLET | Freq: Every day | ORAL | Status: DC

## 2014-07-01 NOTE — Progress Notes (Signed)
[facility] [id number] Progress Note  Rodman Recupero [id number] 79 y.o.  07/01/2014 11:30 AM  Chief Complaint:  Medication management and followup.        History of Present Illness:  Kesley want him to be called [name] came for his followup appointment with his aide.   He was admitted to the hospital in [place] because of UTI.  Patient do not remember the details very well.  However he is taking the medication and denies any irritability anger or any mood swings.  I have noticed his memory has declined slowly and gradually.  He is more anxious but he doesn't remember very well.  Upon discharge from the hospital his medications were changed but he does not remember.  Now he is taking Seroquel 50 mg in the morning and up to 100 mg at bedtime. His ADLs are better.   He is doing exercise 45 minutes every day and he feels proud of it.  His aide reported that there has been no recent aggression violence or agitation.  He denies any paranoia or any hallucination.  He does not have any primary care physician but he is going to multiple doctors for his health needs.  He continues to be in touch with his ex-wife who has been supportive. He is living at [facility].  His appetite is okay.  His vitals are stable.  Suicidal Ideation: No Plan Formed: No Patient has means to carry out plan: No  Homicidal Ideation: No Plan Formed: No Patient has means to carry out plan: No  Review of Systems  HENT: Positive for hearing loss.     Medical history Patient has history of diabetes mellitus, hypertension, Parkinson disease, gait instability, hitting impairment and cognitive impairment.  He has multiple UTIs and he was admitted in May 2015 for UTI.   Outpatient Encounter Prescriptions as of 07/01/2014  Medication Sig  . albuterol (PROVENTIL) (2.5 MG/3ML) 0.083% nebulizer solution Take 3 mLs (2.5 mg total) by nebulization every 2 (two) hours as needed for wheezing or shortness of breath.  Marland Kitchen  amLODipine (NORVASC) 5 MG tablet Take 1 tablet (5 mg total) by mouth daily.  Marland Kitchen aspirin 81 MG chewable tablet Chew 81 mg by mouth daily.  . carbidopa-levodopa (SINEMET IR) 25-250 MG per tablet Take 1 tablet by mouth 3 (three) times daily.  . clonazePAM (KLONOPIN) 0.5 MG tablet Take 1 tablet (0.5 mg total) by mouth at bedtime. For anxiety  . entacapone (COMTAN) 200 MG tablet Take 200 mg by mouth 3 (three) times daily.  Marland Kitchen GLUCERNA (GLUCERNA) LIQD Take 237 mLs by mouth 2 (two) times daily between meals.  Marland Kitchen guaiFENesin (MUCINEX) 600 MG 12 hr tablet Take 1 tablet (600 mg total) by mouth 2 (two) times daily.  . hydrALAZINE (APRESOLINE) 25 MG tablet Take 25 mg by mouth 3 (three) times daily.  Marland Kitchen ipratropium-albuterol (DUONEB) 0.5-2.5 (3) MG/3ML SOLN Take 3 mLs by nebulization 3 (three) times daily.  Marland Kitchen lamoTRIgine (LAMICTAL) 150 MG tablet Take 1 tablet (150 mg total) by mouth 2 (two) times daily.  Marland Kitchen levofloxacin (LEVAQUIN) 750 MG tablet Take 1 tablet (750 mg total) by mouth daily.  Marland Kitchen lisinopril (PRINIVIL,ZESTRIL) 10 MG tablet Take 1 tablet (10 mg total) by mouth daily.  . Multiple Vitamin (MULTIVITAMIN WITH MINERALS) TABS tablet Take 1 tablet by mouth daily.  . pantoprazole (PROTONIX) 40 MG tablet Take 1 tablet (40 mg total) by mouth daily at 12 noon.  . polyethylene glycol (MIRALAX / GLYCOLAX)  packet Take 17 g by mouth daily.  . predniSONE (STERAPRED UNI-PAK) 10 MG tablet Take by mouth daily. Prednisone 40 mg daily for 2 days followed by  Prednisone 20 mg daily for 2 days followed by  Prednisone 10 mg daily for 2 days and stop.  . QUEtiapine (SEROQUEL) 50 MG tablet Take 1 tablet (50 mg total) by mouth at bedtime.  . Red Yeast Rice Extract (RED YEAST RICE PO) Take 2 tablets by mouth at bedtime.   . sertraline (ZOLOFT) 100 MG tablet Take 2 tablets (200 mg total) by mouth daily.  . traZODone (DESYREL) 50 MG tablet Take 1 tablet (50 mg total) by mouth at bedtime.  Cristino Martes Root 500 MG CAPS Take 1 capsule  by mouth at bedtime.  . [DISCONTINUED] clonazePAM (KLONOPIN) 0.5 MG tablet Take 1 tablet (0.5 mg total) by mouth at bedtime. For anxiety  . [DISCONTINUED] lamoTRIgine (LAMICTAL) 150 MG tablet Take 1 tablet (150 mg total) by mouth 2 (two) times daily.  . [DISCONTINUED] QUEtiapine (SEROQUEL) 50 MG tablet Take 1 tablet (50 mg total) by mouth at bedtime.  . [DISCONTINUED] sertraline (ZOLOFT) 100 MG tablet Take 200 mg by mouth daily.  . [DISCONTINUED] traZODone (DESYREL) 50 MG tablet Take 50-100 mg by mouth at bedtime. 50 mg in the am and 140m at bedtime  . [DISCONTINUED] traZODone (DESYREL) 50 MG tablet Take 1 tablet (50 mg total) by mouth at bedtime.   Recent Results (from the past 2160 hour(s))  PRO B NATRIURETIC PEPTIDE     Status: Abnormal   Collection Time    04/17/14 11:16 PM      Result Value Ref Range   Pro B Natriuretic peptide (BNP) 1227.0 (*) 0 - 450 pg/mL  CBC WITH DIFFERENTIAL     Status: Abnormal   Collection Time    04/17/14 11:16 PM      Result Value Ref Range   WBC 9.0  4.0 - 10.5 K/uL   RBC 4.07 (*) 4.22 - 5.81 MIL/uL   Hemoglobin 12.8 (*) 13.0 - 17.0 g/dL   HCT 38.7 (*) 39.0 - 52.0 %   MCV 95.1  78.0 - 100.0 fL   MCH 31.4  26.0 - 34.0 pg   MCHC 33.1  30.0 - 36.0 g/dL   RDW 13.2  11.5 - 15.5 %   Platelets 167  150 - 400 K/uL   Neutrophils Relative % 79 (*) 43 - 77 %   Neutro Abs 7.2  1.7 - 7.7 K/uL   Lymphocytes Relative 14  12 - 46 %   Lymphs Abs 1.2  0.7 - 4.0 K/uL   Monocytes Relative 6  3 - 12 %   Monocytes Absolute 0.6  0.1 - 1.0 K/uL   Eosinophils Relative 0  0 - 5 %   Eosinophils Absolute 0.0  0.0 - 0.7 K/uL   Basophils Relative 0  0 - 1 %   Basophils Absolute 0.0  0.0 - 0.1 K/uL  BASIC METABOLIC PANEL     Status: Abnormal   Collection Time    04/17/14 11:16 PM      Result Value Ref Range   Sodium 141  137 - 147 mEq/L   Potassium 4.3  3.7 - 5.3 mEq/L   Chloride 102  96 - 112 mEq/L   CO2 24  19 - 32 mEq/L   Glucose, Bld 134 (*) 70 - 99 mg/dL   BUN 28  (*) 6 - 23 mg/dL   Creatinine, Ser 1.13  0.50 -  1.35 mg/dL   Calcium 9.0  8.4 - 10.5 mg/dL   GFR calc non Af Amer 61 (*) >90 mL/min   GFR calc Af Amer 70 (*) >90 mL/min   Comment: (NOTE)     The eGFR has been calculated using the CKD EPI equation.     This calculation has not been validated in all clinical situations.     eGFR's persistently <90 mL/min signify possible Chronic Kidney     Disease.  Randolm Idol, ED     Status: None   Collection Time    04/17/14 11:25 PM      Result Value Ref Range   Troponin i, poc 0.03  0.00 - 0.08 ng/mL   Comment 3            Comment: Due to the release kinetics of cTnI,     a negative result within the first hours     of the onset of symptoms does not rule out     myocardial infarction with certainty.     If myocardial infarction is still suspected,     repeat the test at appropriate intervals.  CULTURE, BLOOD (ROUTINE X 2)     Status: None   Collection Time    04/18/14 12:25 AM      Result Value Ref Range   Specimen Description BLOOD LEFT ANTECUBITAL     Special Requests BOTTLES DRAWN AEROBIC AND ANAEROBIC 5 MLS EACH     Culture  Setup Time       Value: 04/18/2014 03:29     Performed at [facility]   Culture       Value: NO GROWTH 5 DAYS     Performed at [facility]   Report Status 04/24/2014 FINAL    CULTURE, BLOOD (ROUTINE X 2)     Status: None   Collection Time    04/18/14 12:33 AM      Result Value Ref Range   Specimen Description BLOOD BLOOD LEFT WRIST     Special Requests BOTTLES DRAWN AEROBIC AND ANAEROBIC 3CC EACH     Culture  Setup Time       Value: 04/18/2014 03:30     Performed at [facility]   Culture       Value: NO GROWTH 5 DAYS     Performed at [facility]   Report Status 04/24/2014 FINAL    URINALYSIS, ROUTINE W REFLEX MICROSCOPIC     Status: Abnormal   Collection Time    04/18/14 12:56 AM      Result Value Ref Range   Color, Urine ORANGE (*) YELLOW   Comment: BIOCHEMICALS  MAY BE AFFECTED BY COLOR   APPearance CLOUDY (*) CLEAR   Specific Gravity, Urine 1.016  1.005 - 1.030   pH 6.0  5.0 - 8.0   Glucose, UA NEGATIVE  NEGATIVE mg/dL   Hgb urine dipstick NEGATIVE  NEGATIVE   Bilirubin Urine NEGATIVE  NEGATIVE   Ketones, ur NEGATIVE  NEGATIVE mg/dL   Protein, ur 30 (*) NEGATIVE mg/dL   Urobilinogen, UA 1.0  0.0 - 1.0 mg/dL   Nitrite POSITIVE (*) NEGATIVE   Leukocytes, UA MODERATE (*) NEGATIVE  URINE CULTURE     Status: None   Collection Time    04/18/14 12:56 AM      Result Value Ref Range   Specimen Description URINE, CATHETERIZED     Special Requests NONE     Culture  Setup Time  Value: 04/18/2014 03:28     Performed at [facility]       Value: >=100,000 COLONIES/ML     Performed at [facility]   Culture       Value: Multiple bacterial morphotypes present, none predominant. Suggest appropriate recollection if clinically indicated.     Performed at [facility]   Report Status 04/20/2014 FINAL    URINE MICROSCOPIC-ADD ON     Status: Abnormal   Collection Time    04/18/14 12:56 AM      Result Value Ref Range   Squamous Epithelial / LPF RARE  RARE   WBC, UA 21-50  <3 WBC/hpf   Comment: WITH CLUMPS   RBC / HPF 3-6  <3 RBC/hpf   Bacteria, UA MANY (*) RARE  MRSA PCR SCREENING     Status: Abnormal   Collection Time    04/18/14  6:04 AM      Result Value Ref Range   MRSA by PCR POSITIVE (*) NEGATIVE   Comment:            The GeneXpert MRSA Assay (FDA     approved for NASAL specimens     only), is one component of a     comprehensive MRSA colonization     surveillance program. It is not     intended to diagnose MRSA     infection nor to guide or     monitor treatment for     MRSA infections.     RESULT CALLED TO, READ BACK BY AND VERIFIED WITH:     A MABRY AT 0731 ON 05.02.2015 BY NBROOKS  TROPONIN I     Status: None   Collection Time    04/18/14  6:46 AM      Result Value Ref Range   Troponin  I <0.30  <0.30 ng/mL   Comment:            Due to the release kinetics of cTnI,     a negative result within the first hours     of the onset of symptoms does not rule out     myocardial infarction with certainty.     If myocardial infarction is still suspected,     repeat the test at appropriate intervals.  COMPREHENSIVE METABOLIC PANEL     Status: Abnormal   Collection Time    04/18/14  6:46 AM      Result Value Ref Range   Sodium 141  137 - 147 mEq/L   Potassium 4.3  3.7 - 5.3 mEq/L   Chloride 104  96 - 112 mEq/L   CO2 27  19 - 32 mEq/L   Glucose, Bld 96  70 - 99 mg/dL   BUN 25 (*) 6 - 23 mg/dL   Creatinine, Ser 1.22  0.50 - 1.35 mg/dL   Calcium 8.6  8.4 - 10.5 mg/dL   Total Protein 5.4 (*) 6.0 - 8.3 g/dL   Albumin 3.5  3.5 - 5.2 g/dL   AST 16  0 - 37 U/L   ALT 13  0 - 53 U/L   Alkaline Phosphatase 74  39 - 117 U/L   Total Bilirubin 0.3  0.3 - 1.2 mg/dL   GFR calc non Af Amer 55 (*) >90 mL/min   GFR calc Af Amer 64 (*) >90 mL/min   Comment: (NOTE)     The eGFR has been calculated using the CKD EPI equation.  This calculation has not been validated in all clinical situations.     eGFR's persistently <90 mL/min signify possible Chronic Kidney     Disease.  CBC WITH DIFFERENTIAL     Status: Abnormal   Collection Time    04/18/14  6:46 AM      Result Value Ref Range   WBC 7.9  4.0 - 10.5 K/uL   RBC 3.69 (*) 4.22 - 5.81 MIL/uL   Hemoglobin 11.5 (*) 13.0 - 17.0 g/dL   HCT 34.9 (*) 39.0 - 52.0 %   MCV 94.6  78.0 - 100.0 fL   MCH 31.2  26.0 - 34.0 pg   MCHC 33.0  30.0 - 36.0 g/dL   RDW 13.3  11.5 - 15.5 %   Platelets 181  150 - 400 K/uL   Neutrophils Relative % 86 (*) 43 - 77 %   Neutro Abs 6.8  1.7 - 7.7 K/uL   Lymphocytes Relative 6 (*) 12 - 46 %   Lymphs Abs 0.4 (*) 0.7 - 4.0 K/uL   Monocytes Relative 8  3 - 12 %   Monocytes Absolute 0.6  0.1 - 1.0 K/uL   Eosinophils Relative 0  0 - 5 %   Eosinophils Absolute 0.0  0.0 - 0.7 K/uL   Basophils Relative 0  0 - 1 %    Basophils Absolute 0.0  0.0 - 0.1 K/uL  HEMOGLOBIN A1C     Status: None   Collection Time    04/18/14  6:46 AM      Result Value Ref Range   Hemoglobin A1C 4.7  <5.7 %   Comment: (NOTE)                                                                               According to the ADA Clinical Practice Recommendations for 2011, when     HbA1c is used as a screening test:      >=6.5%   Diagnostic of Diabetes Mellitus               (if abnormal result is confirmed)     5.7-6.4%   Increased risk of developing Diabetes Mellitus     References:Diagnosis and Classification of Diabetes Mellitus,Diabetes     MBWG,6659,93(TTSVX 1):S62-S69 and Standards of Medical Care in             Diabetes - 2011,Diabetes Care,2011,34 (Suppl 1):S11-S61.   Mean Plasma Glucose 88  <117 mg/dL   Comment: Performed at [place], URINE     Status: None   Collection Time    04/18/14  6:47 AM      Result Value Ref Range   Specimen Description URINE, CATHETERIZED     Special Requests NONE     Legionella Antigen, Urine       Value: Negative for Legionella pneumophilia serogroup 1     Performed at [facility]   Report Status 04/20/2014 FINAL    STREP PNEUMONIAE URINARY ANTIGEN     Status: None   Collection Time    04/18/14  6:47 AM      Result Value Ref Range   Strep Pneumo Urinary Antigen NEGATIVE  NEGATIVE   Comment:            Infection due to S. pneumoniae     cannot be absolutely ruled out     since the antigen present     may be below the detection limit     of the test.     PERFORMED AT [facility]     Performed at [place], CAPILLARY     Status: Abnormal   Collection Time    04/18/14  7:39 AM      Result Value Ref Range   Glucose-Capillary 108 (*) 70 - 99 mg/dL  CULTURE, EXPECTORATED SPUTUM-ASSESSMENT     Status: None   Collection Time    04/18/14  9:19 AM      Result Value Ref Range   Specimen Description SPUTUM      Special Requests Normal     Sputum evaluation       Value: THIS SPECIMEN IS ACCEPTABLE. RESPIRATORY CULTURE REPORT TO FOLLOW.   Report Status 04/18/2014 FINAL    CULTURE, RESPIRATORY (NON-EXPECTORATED)     Status: None   Collection Time    04/18/14  9:19 AM      Result Value Ref Range   Specimen Description SPUTUM     Special Requests NONE     Gram Stain       Value: MODERATE WBC PRESENT, PREDOMINANTLY PMN     FEW SQUAMOUS EPITHELIAL CELLS PRESENT     MODERATE GRAM NEGATIVE RODS     MODERATE GRAM POSITIVE COCCI IN PAIRS     IN CLUSTERS FEW GRAM POSITIVE RODS   Culture       Value: NORMAL OROPHARYNGEAL FLORA     Performed at [facility]   Report Status 04/20/2014 FINAL    GLUCOSE, CAPILLARY     Status: Abnormal   Collection Time    04/18/14 10:58 AM      Result Value Ref Range   Glucose-Capillary 154 (*) 70 - 99 mg/dL  TROPONIN I     Status: None   Collection Time    04/18/14 11:45 AM      Result Value Ref Range   Troponin I <0.30  <0.30 ng/mL   Comment:            Due to the release kinetics of cTnI,     a negative result within the first hours     of the onset of symptoms does not rule out     myocardial infarction with certainty.     If myocardial infarction is still suspected,     repeat the test at appropriate intervals.  GLUCOSE, CAPILLARY     Status: Abnormal   Collection Time    04/18/14  3:26 PM      Result Value Ref Range   Glucose-Capillary 223 (*) 70 - 99 mg/dL  TROPONIN I     Status: None   Collection Time    04/18/14  6:16 PM      Result Value Ref Range   Troponin I <0.30  <0.30 ng/mL   Comment:            Due to the release kinetics of cTnI,     a negative result within the first hours     of the onset of symptoms does not rule out     myocardial infarction with certainty.     If myocardial infarction is still suspected,     repeat the  test at appropriate intervals.  GLUCOSE, CAPILLARY     Status: Abnormal   Collection Time    04/18/14   8:07 PM      Result Value Ref Range   Glucose-Capillary 158 (*) 70 - 99 mg/dL  GLUCOSE, CAPILLARY     Status: None   Collection Time    04/18/14 11:32 PM      Result Value Ref Range   Glucose-Capillary 87  70 - 99 mg/dL  GLUCOSE, CAPILLARY     Status: None   Collection Time    04/19/14  3:25 AM      Result Value Ref Range   Glucose-Capillary 91  70 - 99 mg/dL  GLUCOSE, CAPILLARY     Status: None   Collection Time    04/19/14  7:43 AM      Result Value Ref Range   Glucose-Capillary 94  70 - 99 mg/dL  BASIC METABOLIC PANEL     Status: Abnormal   Collection Time    04/19/14  7:45 AM      Result Value Ref Range   Sodium 143  137 - 147 mEq/L   Potassium 4.5  3.7 - 5.3 mEq/L   Chloride 108  96 - 112 mEq/L   CO2 27  19 - 32 mEq/L   Glucose, Bld 104 (*) 70 - 99 mg/dL   BUN 23  6 - 23 mg/dL   Creatinine, Ser 1.11  0.50 - 1.35 mg/dL   Calcium 8.4  8.4 - 10.5 mg/dL   GFR calc non Af Amer 62 (*) >90 mL/min   GFR calc Af Amer 72 (*) >90 mL/min   Comment: (NOTE)     The eGFR has been calculated using the CKD EPI equation.     This calculation has not been validated in all clinical situations.     eGFR's persistently <90 mL/min signify possible Chronic Kidney     Disease.  GLUCOSE, CAPILLARY     Status: Abnormal   Collection Time    04/19/14 12:26 PM      Result Value Ref Range   Glucose-Capillary 116 (*) 70 - 99 mg/dL  GLUCOSE, CAPILLARY     Status: Abnormal   Collection Time    04/19/14  3:35 PM      Result Value Ref Range   Glucose-Capillary 150 (*) 70 - 99 mg/dL  GLUCOSE, CAPILLARY     Status: Abnormal   Collection Time    04/19/14  7:52 PM      Result Value Ref Range   Glucose-Capillary 179 (*) 70 - 99 mg/dL  VANCOMYCIN, TROUGH     Status: None   Collection Time    04/20/14  1:14 AM      Result Value Ref Range   Vancomycin Tr 13.1  10.0 - 20.0 ug/mL  CBC     Status: Abnormal   Collection Time    04/20/14  1:14 AM      Result Value Ref Range   WBC 6.1  4.0 - 10.5  K/uL   RBC 3.68 (*) 4.22 - 5.81 MIL/uL   Hemoglobin 11.2 (*) 13.0 - 17.0 g/dL   HCT 35.5 (*) 39.0 - 52.0 %   MCV 96.5  78.0 - 100.0 fL   MCH 30.4  26.0 - 34.0 pg   MCHC 31.5  30.0 - 36.0 g/dL   RDW 13.1  11.5 - 15.5 %   Platelets 190  150 - 400 K/uL  BASIC METABOLIC PANEL  Status: Abnormal   Collection Time    04/20/14  1:14 AM      Result Value Ref Range   Sodium 141  137 - 147 mEq/L   Potassium 4.7  3.7 - 5.3 mEq/L   Chloride 105  96 - 112 mEq/L   CO2 25  19 - 32 mEq/L   Glucose, Bld 124 (*) 70 - 99 mg/dL   BUN 25 (*) 6 - 23 mg/dL   Creatinine, Ser 1.79  0.50 - 1.35 mg/dL   Calcium 8.7  8.4 - 94.0 mg/dL   GFR calc non Af Amer 69 (*) >90 mL/min   GFR calc Af Amer 80 (*) >90 mL/min   Comment: (NOTE)     The eGFR has been calculated using the CKD EPI equation.     This calculation has not been validated in all clinical situations.     eGFR's persistently <90 mL/min signify possible Chronic Kidney     Disease.  GLUCOSE, CAPILLARY     Status: None   Collection Time    04/20/14  8:26 AM      Result Value Ref Range   Glucose-Capillary 84  70 - 99 mg/dL  GLUCOSE, CAPILLARY     Status: Abnormal   Collection Time    04/20/14 12:13 PM      Result Value Ref Range   Glucose-Capillary 127 (*) 70 - 99 mg/dL  GLUCOSE, CAPILLARY     Status: Abnormal   Collection Time    04/20/14  5:22 PM      Result Value Ref Range   Glucose-Capillary 189 (*) 70 - 99 mg/dL  GLUCOSE, CAPILLARY     Status: Abnormal   Collection Time    04/20/14  9:56 PM      Result Value Ref Range   Glucose-Capillary 135 (*) 70 - 99 mg/dL   Comment 1 Notify RN    GLUCOSE, CAPILLARY     Status: None   Collection Time    04/21/14  7:50 AM      Result Value Ref Range   Glucose-Capillary 82  70 - 99 mg/dL   Comment 1 Documented in Chart     Comment 2 Notify RN    GLUCOSE, CAPILLARY     Status: Abnormal   Collection Time    04/21/14 11:52 AM      Result Value Ref Range   Glucose-Capillary 120 (*) 70 - 99  mg/dL   Comment 1 Documented in Chart     Comment 2 Notify RN      Past Psychiatric History/Hospitalization(s): Patient has significant history of bipolar disorder.  He has multiple psychiatric admission due to decompensation and noncompliance of medication.  Patient has history of aggression and impulsive behavior.  His last psychiatric admission was in 2005. Anxiety: Yes Bipolar Disorder: Yes Depression: No Mania: No Psychosis: No Schizophrenia: No Personality Disorder: No Hospitalization for psychiatric illness: Yes History of Electroconvulsive Shock Therapy: No Prior Suicide Attempts: No  Physical Exam: Constitutional:  BP 117/60  Pulse 52  Ht 5\' 10"  (1.778 m)  Wt 165 lb (74.844 kg)  BMI 23.68 kg/m2  General Appearance: Patient is pleasant but confused.  He is fairly groomed.  Musculoskeletal: Strength & Muscle Tone: decreased and atrophy Gait & Station: unsteady Patient leans: Front  Mental status examination Patient is casually dressed and fairly groomed. He maintained fair eye contact. He is pleasant and cooperative.  He continues to have difficulty remembering things.  His speech is soft and spontaneous  with normal tone and volume.  His thought processes slow but logical and goal directed.  He denies any active or passive suicidal thoughts or homicidal thoughts.  He denies any auditory or visual hallucination.  His attention and concentration is poor.  He described his mood is anxious and his affect is mood appropriate.  There were no delusions , paranoia or obsession present at this time.  He has fine tremors and has difficulty walking.  He need walker .  His gait is unsteady.  He's alert and oriented x3.  His insight judgment and impulse control is fair.  Established Problem, Stable/Improving (1), Review of Psycho-Social Stressors (1), Review or order clinical lab tests (1), Review of Last Therapy Session (1), Review of Medication Regimen & Side Effects (2) and Review of  New Medication or Change in Dosage (2)  Assessment: Axis I: Bipolar disorder, cognitive disorder NOS  Axis II: Deferred  Axis III: See medical history  Axis IV: Moderate  Axis V: 50-55   Plan:  I reviewed discharge summary current medication, blood work results and his psychosocial stressors.  I will continue his current psychotropic medication.  I was reduced his Seroquel to only 50 mg at bedtime which he used to take in the past.  I'm concerned about this fall and is repeated UTI.  Continue Zoloft 200 mg at bedtime, trazodone 50 mg at bedtime, Seroquel 50 mg at bedtime and Klonopin 0.5 mg at bedtime.  Discussed risks and benefits of medication and recommended to call us back if he has any question or a concern.  Followup in 3 months. Time spent 25 minutes.  More than 50% of the time spent in psychoeducation, counseling and coordination of care.  Discuss safety plan that anytime having active suicidal thoughts or homicidal thoughts then patient need to call 911 or go to the local emergency room.   Ladarion Munyon T., MD 07/01/2014

## 2014-07-02 NOTE — Telephone Encounter (Signed)
Phoned Laureen at Dr. Marguerite Olea direction to give information regarding patient. Not in at the moment.Staff will give her a message to contact our office at her convenience.

## 2014-07-24 ENCOUNTER — Emergency Department (HOSPITAL_COMMUNITY)
Admission: EM | Admit: 2014-07-24 | Discharge: 2014-07-25 | Disposition: A | Discharge status: Home Health | Payer: Medicare Other | Attending: Emergency Medicine | Admitting: Emergency Medicine

## 2014-07-24 ENCOUNTER — Encounter (HOSPITAL_COMMUNITY): Payer: Self-pay | Admitting: Emergency Medicine

## 2014-07-24 DIAGNOSIS — I10 Essential (primary) hypertension: Secondary | ICD-10-CM | POA: Diagnosis not present

## 2014-07-24 DIAGNOSIS — Z8669 Personal history of other diseases of the nervous system and sense organs: Secondary | ICD-10-CM | POA: Diagnosis not present

## 2014-07-24 DIAGNOSIS — Z792 Long term (current) use of antibiotics: Secondary | ICD-10-CM | POA: Diagnosis not present

## 2014-07-24 DIAGNOSIS — E871 Hypo-osmolality and hyponatremia: Secondary | ICD-10-CM | POA: Insufficient documentation

## 2014-07-24 DIAGNOSIS — Z8614 Personal history of Methicillin resistant Staphylococcus aureus infection: Secondary | ICD-10-CM | POA: Insufficient documentation

## 2014-07-24 DIAGNOSIS — Z87448 Personal history of other diseases of urinary system: Secondary | ICD-10-CM | POA: Insufficient documentation

## 2014-07-24 DIAGNOSIS — B379 Candidiasis, unspecified: Secondary | ICD-10-CM | POA: Diagnosis not present

## 2014-07-24 DIAGNOSIS — Z79899 Other long term (current) drug therapy: Secondary | ICD-10-CM | POA: Insufficient documentation

## 2014-07-24 DIAGNOSIS — Z7982 Long term (current) use of aspirin: Secondary | ICD-10-CM | POA: Insufficient documentation

## 2014-07-24 DIAGNOSIS — F411 Generalized anxiety disorder: Secondary | ICD-10-CM | POA: Diagnosis not present

## 2014-07-24 DIAGNOSIS — M129 Arthropathy, unspecified: Secondary | ICD-10-CM | POA: Insufficient documentation

## 2014-07-24 DIAGNOSIS — Z8701 Personal history of pneumonia (recurrent): Secondary | ICD-10-CM | POA: Diagnosis not present

## 2014-07-24 DIAGNOSIS — E119 Type 2 diabetes mellitus without complications: Secondary | ICD-10-CM | POA: Insufficient documentation

## 2014-07-24 DIAGNOSIS — R4182 Altered mental status, unspecified: Secondary | ICD-10-CM | POA: Insufficient documentation

## 2014-07-24 DIAGNOSIS — Z8781 Personal history of (healed) traumatic fracture: Secondary | ICD-10-CM | POA: Diagnosis not present

## 2014-07-24 DIAGNOSIS — F319 Bipolar disorder, unspecified: Secondary | ICD-10-CM | POA: Diagnosis not present

## 2014-07-24 LAB — URINALYSIS, ROUTINE W REFLEX MICROSCOPIC
Bilirubin Urine: NEGATIVE
GLUCOSE, UA: NEGATIVE mg/dL
Hgb urine dipstick: NEGATIVE
Ketones, ur: NEGATIVE mg/dL
NITRITE: NEGATIVE
PROTEIN: NEGATIVE mg/dL
Specific Gravity, Urine: 1.014 (ref 1.005–1.030)
Urobilinogen, UA: 0.2 mg/dL (ref 0.0–1.0)
pH: 6.5 (ref 5.0–8.0)

## 2014-07-24 LAB — URINE MICROSCOPIC-ADD ON

## 2014-07-24 LAB — CBC WITH DIFFERENTIAL/PLATELET
BASOS ABS: 0 10*3/uL (ref 0.0–0.1)
Basophils Relative: 0 % (ref 0–1)
EOS ABS: 0.2 10*3/uL (ref 0.0–0.7)
Eosinophils Relative: 2 % (ref 0–5)
HEMATOCRIT: 40.4 % (ref 39.0–52.0)
Hemoglobin: 14 g/dL (ref 13.0–17.0)
Lymphocytes Relative: 14 % (ref 12–46)
Lymphs Abs: 1.4 10*3/uL (ref 0.7–4.0)
MCH: 30.3 pg (ref 26.0–34.0)
MCHC: 34.7 g/dL (ref 30.0–36.0)
MCV: 87.4 fL (ref 78.0–100.0)
MONO ABS: 0.6 10*3/uL (ref 0.1–1.0)
Monocytes Relative: 5 % (ref 3–12)
Neutro Abs: 8.2 10*3/uL — ABNORMAL HIGH (ref 1.7–7.7)
Neutrophils Relative %: 79 % — ABNORMAL HIGH (ref 43–77)
PLATELETS: 228 10*3/uL (ref 150–400)
RBC: 4.62 MIL/uL (ref 4.22–5.81)
RDW: 13.2 % (ref 11.5–15.5)
WBC: 10.4 10*3/uL (ref 4.0–10.5)

## 2014-07-24 NOTE — ED Notes (Signed)
Bed: WA09 Expected date:  Expected time:  Means of arrival:  Comments: EMS 

## 2014-07-24 NOTE — ED Notes (Signed)
Pt is confused and does not know why he is here.  Spoke to pt's POA [name] and she wants pt to be evaluated for UTI.  She reports that the last time pt was acting like this that he was dx with UTI.

## 2014-07-24 NOTE — ED Notes (Signed)
Pt reports he wanted to come to the ED tonight because he feels "wtihdrawn" from the people "out there."  Pt lives in an assisted living.  Pt reports he cannot sleep but has trazodone and klonopin rx to him, but he states that it does not last all night.  Pt then states "i just wanna be able to go in bed and sleep most of the night."  Denies any pain at this time.

## 2014-07-24 NOTE — ED Provider Notes (Signed)
CSN: [id number]     Arrival date & time 07/24/14  2036 History   First MD Initiated Contact with Patient 07/24/14 2253     Chief Complaint  Patient presents with  . Altered Mental Status     (Consider location/radiation/quality/duration/timing/severity/associated sxs/prior Treatment) HPI Comments: [name] - the POA - states that the pt has had recent UTI -  REcently placed on abx for a UTI - has been on for > 1 week - she has not personally seen Mr. Holt but when she talked to him on the phone this evening, he seemed confused, he told her that he wanted to go to the hospital b/c he "felt weird" - he did not know that he was at the hospital when he called her from here.  No hallucinations of delusions.  He lives at the [facility] - has full assistance there for taking meds and cleaning etc.  Pt states to me that he does not feel bad, and wants to go home.  He is able to tell me that he is in the hospital.  He denies fevers, vomitnig, cough, and has no pain    Patient is a 79 y.o. male presenting with altered mental status. The history is provided by the patient and a relative.  Altered Mental Status   Past Medical History  Diagnosis Date  . Hearing aid worn   . Hypertension   . Parkinson disease   . Pneumonia   . Diabetes mellitus type II   . Arthritis   . Bipolar disorder   . Anxiety   . Depression   . MRSA infection (methicillin-resistant Staphylococcus aureus)   . Chronic indwelling Foley catheter   . C2 cervical fracture     due to pt fall  . SIRS (systemic inflammatory response syndrome)   . Neuromuscular disorder   . Memory loss   . Benign prostate hyperplasia    Past Surgical History  Procedure Laterality Date  . Total hip arthroplasty    . Video bronchoscopy  12/16/2012    Procedure: VIDEO BRONCHOSCOPY;  Surgeon: Melrose Nakayama, MD;  Location: [facility];  Service: Thoracic;  Laterality: Right;  . Video assisted thoracoscopy (vats)/empyema  12/16/2012     Procedure: VIDEO ASSISTED THORACOSCOPY (VATS)/EMPYEMA;  Surgeon: Melrose Nakayama, MD;  Location: [facility];  Service: Thoracic;  Laterality: Right;   Family History  Problem Relation Age of Onset  . Depression Father   . Cancer Mother   . Heart disease Mother    History  Substance Use Topics  . Smoking status: Never Smoker   . Smokeless tobacco: Never Used  . Alcohol Use: No     Comment: occasional    Review of Systems  All other systems reviewed and are negative.     Allergies  Cholestatin  Home Medications   Prior to Admission medications   Medication Sig Start Date End Date Taking? Authorizing Provider  acetaminophen (TYLENOL) 325 MG tablet Take 650 mg by mouth every 6 (six) hours as needed for mild pain.   Yes Historical Provider, MD  amLODipine (NORVASC) 5 MG tablet Take 1 tablet (5 mg total) by mouth daily. 02/03/13  Yes Janece Canterbury, MD  aspirin 81 MG chewable tablet Chew 81 mg by mouth daily.   Yes Historical Provider, MD  carbidopa-levodopa (SINEMET IR) 25-250 MG per tablet Take 1 tablet by mouth 3 (three) times daily.   Yes Historical Provider, MD  ciprofloxacin (CILOXAN) 0.3 % ophthalmic solution Place 2 drops into  both eyes every 4 (four) hours while awake. Administer 1 drop, every 2 hours, while awake, for 2 days. Then 1 drop, every 4 hours, while awake, for the next 5 days.   Yes Historical Provider, MD  clonazePAM (KLONOPIN) 0.5 MG tablet Take 1 tablet (0.5 mg total) by mouth at bedtime. For anxiety 07/01/14  Yes Kathlee Nations, MD  entacapone (COMTAN) 200 MG tablet Take 200 mg by mouth 3 (three) times daily.   Yes Historical Provider, MD  GLUCERNA (GLUCERNA) LIQD Take 237 mLs by mouth daily.    Yes Historical Provider, MD  guaiFENesin (MUCINEX) 600 MG 12 hr tablet Take 1 tablet (600 mg total) by mouth 2 (two) times daily. 04/21/14  Yes Hosie Poisson, MD  hydrALAZINE (APRESOLINE) 25 MG tablet Take 25 mg by mouth 3 (three) times daily.   Yes Historical Provider, MD   lamoTRIgine (LAMICTAL) 150 MG tablet Take 1 tablet (150 mg total) by mouth 2 (two) times daily. 07/01/14  Yes Kathlee Nations, MD  lisinopril (PRINIVIL,ZESTRIL) 10 MG tablet Take 1 tablet (10 mg total) by mouth daily. 02/03/13  Yes Janece Canterbury, MD  mineral oil-hydrophilic petrolatum (AQUAPHOR) ointment Apply 1 application topically 2 (two) times daily as needed for dry skin. For dry skin on heels   Yes Historical Provider, MD  Multiple Vitamin (MULTIVITAMIN WITH MINERALS) TABS tablet Take 1 tablet by mouth daily.   Yes Historical Provider, MD  pantoprazole (PROTONIX) 40 MG tablet Take 1 tablet (40 mg total) by mouth daily at 12 noon. 04/21/14  Yes Hosie Poisson, MD  polyethylene glycol (MIRALAX / GLYCOLAX) packet Take 17 g by mouth daily.   Yes Historical Provider, MD  QUEtiapine (SEROQUEL) 50 MG tablet Take 1 tablet (50 mg total) by mouth at bedtime. 07/01/14  Yes Kathlee Nations, MD  Red Yeast Rice Extract (RED YEAST RICE PO) Take 2 tablets by mouth at bedtime.    Yes Historical Provider, MD  sertraline (ZOLOFT) 100 MG tablet Take 2 tablets (200 mg total) by mouth daily. 07/01/14  Yes Kathlee Nations, MD  traZODone (DESYREL) 50 MG tablet Take 1 tablet (50 mg total) by mouth at bedtime. 07/01/14  Yes Kathlee Nations, MD  Valerian Root 500 MG CAPS Take 1 capsule by mouth at bedtime.   Yes Historical Provider, MD  albuterol (PROVENTIL) (2.5 MG/3ML) 0.083% nebulizer solution Take 3 mLs (2.5 mg total) by nebulization every 2 (two) hours as needed for wheezing or shortness of breath. 04/21/14   Hosie Poisson, MD  fluconazole (DIFLUCAN) 150 MG tablet Take 1 tablet (150 mg total) by mouth once. 07/25/14   Johnna Acosta, MD  ipratropium-albuterol (DUONEB) 0.5-2.5 (3) MG/3ML SOLN Take 3 mLs by nebulization 3 (three) times daily. 04/21/14   Hosie Poisson, MD   BP 178/82  Pulse 52  Temp(Src) 98.2 F (36.8 C) (Oral)  Resp 20  SpO2 97% Physical Exam  Nursing note and vitals reviewed. Constitutional: He appears  well-developed and well-nourished. No distress.  HENT:  Head: Normocephalic and atraumatic.  Mouth/Throat: Oropharynx is clear and moist. No oropharyngeal exudate.  Eyes: Conjunctivae and EOM are normal. Pupils are equal, round, and reactive to light. Right eye exhibits no discharge. Left eye exhibits no discharge. No scleral icterus.  Neck: Normal range of motion. Neck supple. No JVD present. No thyromegaly present.  Cardiovascular: Normal rate, regular rhythm, normal heart sounds and intact distal pulses.  Exam reveals no gallop and no friction rub.   No murmur heard. Pulmonary/Chest:  Effort normal and breath sounds normal. No respiratory distress. He has no wheezes. He has no rales.  Abdominal: Soft. Bowel sounds are normal. He exhibits no distension and no mass. There is no tenderness.  Genitourinary:  Normal appearing genitals - foley catheter in place - urine is yellow and clear.  Musculoskeletal: Normal range of motion. He exhibits no edema and no tenderness.  Lymphadenopathy:    He has no cervical adenopathy.  Neurological: He is alert. Coordination normal.  Normal gait, normal speech, slight confusion regarding the type of infection that he has but is able to me that he has been on antibiotics, able to tell me that he is in the hospital, follows commands without any difficulty with normal coordination.  Skin: Skin is warm and dry. No rash noted. No erythema.  Psychiatric: He has a normal mood and affect. His behavior is normal.    ED Course  Procedures (including critical care time) Labs Review Labs Reviewed  COMPREHENSIVE METABOLIC PANEL - Abnormal; Notable for the following:    Sodium 133 (*)    Glucose, Bld 101 (*)    Total Bilirubin 0.2 (*)    GFR calc non Af Amer 65 (*)    GFR calc Af Amer 75 (*)    All other components within normal limits  CBC WITH DIFFERENTIAL - Abnormal; Notable for the following:    Neutrophils Relative % 79 (*)    Neutro Abs 8.2 (*)    All other  components within normal limits  URINALYSIS, ROUTINE W REFLEX MICROSCOPIC - Abnormal; Notable for the following:    APPearance CLOUDY (*)    Leukocytes, UA MODERATE (*)    All other components within normal limits  URINE MICROSCOPIC-ADD ON - Abnormal; Notable for the following:    Squamous Epithelial / LPF FEW (*)    All other components within normal limits  URINE CULTURE    Imaging Review No results found.    MDM   Final diagnoses:  Hyponatremia  Yeast infection    The patient has a slight hint of confusion but overall appears very benign including his vital signs and physical exam. He is not on any new medications other than an antibiotic, he does take multiple psychiatric medications, check urine, check labs for electrolyte abnormalities such as hyponatremia or renal dysfunction causing possible contusion.  On repeat exam the patient continues to appear stable and without any complaints. Laboratory workup shows minimal hyponatremia but not in need of acute treatment, urinalysis shows no signs of significant bacterial infection considering this is an indwelling catheter, there is some yeast which will be treated. Stable for discharge    Meds given in ED:  Medications - No data to display  New Prescriptions   FLUCONAZOLE (DIFLUCAN) 150 MG TABLET    Take 1 tablet (150 mg total) by mouth once.      Johnna Acosta, MD 07/25/14 [phone]

## 2014-07-24 NOTE — ED Notes (Signed)
Per EMS, pt is ambulatory to the truck

## 2014-07-25 LAB — COMPREHENSIVE METABOLIC PANEL
ALK PHOS: 86 U/L (ref 39–117)
ALT: 13 U/L (ref 0–53)
AST: 18 U/L (ref 0–37)
Albumin: 3.9 g/dL (ref 3.5–5.2)
Anion gap: 15 (ref 5–15)
BILIRUBIN TOTAL: 0.2 mg/dL — AB (ref 0.3–1.2)
BUN: 22 mg/dL (ref 6–23)
CO2: 22 meq/L (ref 19–32)
Calcium: 9.2 mg/dL (ref 8.4–10.5)
Chloride: 96 mEq/L (ref 96–112)
Creatinine, Ser: 1.07 mg/dL (ref 0.50–1.35)
GFR calc Af Amer: 75 mL/min — ABNORMAL LOW (ref >90)
GFR, EST NON AFRICAN AMERICAN: 65 mL/min — AB (ref >90)
Glucose, Bld: 101 mg/dL — ABNORMAL HIGH (ref 70–99)
Potassium: 4.4 mEq/L (ref 3.7–5.3)
SODIUM: 133 meq/L — AB (ref 137–147)
Total Protein: 6 g/dL (ref 6.0–8.3)

## 2014-07-25 MED ORDER — FLUCONAZOLE 150 MG PO TABS: 150.0000 mg | ORAL_TABLET | Freq: Once | ORAL | Status: DC

## 2014-07-25 NOTE — Discharge Instructions (Signed)
Please use extra salt on your food for the next couple of days, have your family doctor recheck your sodium level and a urine sample in one week.  Please call your doctor for a followup appointment within 24-48 hours. When you talk to your doctor please let them know that you were seen in the emergency department and have them acquire all of your records so that they can discuss the findings with you and formulate a treatment plan to fully care for your new and ongoing problems.

## 2014-07-28 LAB — URINE CULTURE: Colony Count: >=100000

## 2014-07-29 ENCOUNTER — Telehealth (HOSPITAL_BASED_OUTPATIENT_CLINIC_OR_DEPARTMENT_OTHER): Payer: Self-pay | Admitting: Emergency Medicine

## 2014-07-29 NOTE — Telephone Encounter (Signed)
Post ED Visit - Positive Culture Follow-up  Culture report reviewed by antimicrobial stewardship pharmacist: []  Wes Dulaney, Pharm.D., BCPS []  Heide Guile, Pharm.D., BCPS []  Alycia Rossetti, Pharm.D., BCPS []  [place], Pharm.D., BCPS, AAHIVP []  Legrand Como, Pharm.D., BCPS, AAHIVP []  Hassie Bruce, Pharm.D. [x]  Milus Glazier, [place].D.  Positive urine culture >100,000 colonies/ml Treated with fluconazole 150 mg tabs, take 1 tablet by mouth x 1, organism sensitive to the same and no further patient follow-up is required at this time.  Hazle Nordmann 07/29/2014, 2:52 PM

## 2014-08-11 ENCOUNTER — Encounter (HOSPITAL_COMMUNITY): Payer: Self-pay | Admitting: Psychiatry

## 2014-08-11 ENCOUNTER — Telehealth (HOSPITAL_COMMUNITY): Payer: Self-pay

## 2014-08-11 ENCOUNTER — Ambulatory Visit (INDEPENDENT_AMBULATORY_CARE_PROVIDER_SITE_OTHER): Payer: Medicare Other | Admitting: Psychiatry

## 2014-08-11 VITALS — BP 142/65 | HR 64 | Ht 70.5 in | Wt 163.8 lb

## 2014-08-11 DIAGNOSIS — F319 Bipolar disorder, unspecified: Secondary | ICD-10-CM

## 2014-08-11 DIAGNOSIS — F09 Unspecified mental disorder due to known physiological condition: Secondary | ICD-10-CM

## 2014-08-11 MED ORDER — TRAZODONE HCL 100 MG PO TABS: 100.0000 mg | ORAL_TABLET | Freq: Every day | ORAL | Status: DC

## 2014-08-11 MED ORDER — QUETIAPINE FUMARATE 100 MG PO TABS: 100.0000 mg | ORAL_TABLET | Freq: Every day | ORAL | Status: DC

## 2014-08-11 NOTE — Progress Notes (Signed)
[facility] [phone] Progress Note  [name] [id number] 79 y.o.  08/11/2014 11:59 AM  Chief Complaint:  I cannot sleep.          History of Present Illness:  [name] came earlier than his scheduled appointment with his aide.  He is complaining of poor sleep.  Recently he visited the emergency room because of change in mental status.  He was given antibiotic.  He has noticed irritability, lack of sleep and racing thoughts.  His memory is poor.  He does not remember why he visited to the emergency room.  Before he did not admitted he has a memory problem now he feels that he is not as sharp in his memory.  On his last visit he did do Seroquel because of falls risk and repeated UTIs.  However he has noticed more irritability , insomnia and he continues to have UTI.  He admitted taking more Klonopin because he could not sleep.  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  He denies agitation, anger, mood swings.  He denies any paranoia or any hallucination.  He sees Dr. made housecalls and he's not happy with them because his medicines this keep changing.  He is going to multiple doctors for his health needs.  His ex-wife has power of attorney and he is in touch with her.  Today his vitals are stable.  He endorsed no change in his appetite.  His energy level is okay.  Suicidal Ideation: No Plan Formed: No Patient has means to carry out plan: No  Homicidal Ideation: No Plan Formed: No Patient has means to carry out plan: No  Review of Systems  HENT: Positive for hearing loss.   Neurological:       Memory impairment    Medical history Patient has history of diabetes mellitus, hypertension, Parkinson disease, gait instability, hearing and memory impairment. He has multiple UTIs and he was admitted in May 2015 for UTI.   Outpatient Encounter Prescriptions as of 08/11/2014  Medication Sig  . traZODone (DESYREL) 50 MG tablet Take 50 mg by mouth at bedtime.  Marland Kitchen  acetaminophen (TYLENOL) 325 MG tablet Take 650 mg by mouth every 6 (six) hours as needed for mild pain.  Marland Kitchen albuterol (PROVENTIL) (2.5 MG/3ML) 0.083% nebulizer solution Take 3 mLs (2.5 mg total) by nebulization every 2 (two) hours as needed for wheezing or shortness of breath.  Marland Kitchen amLODipine (NORVASC) 5 MG tablet Take 1 tablet (5 mg total) by mouth daily.  Marland Kitchen aspirin 81 MG chewable tablet Chew 81 mg by mouth daily.  . carbidopa-levodopa (SINEMET IR) 25-250 MG per tablet Take 1 tablet by mouth 3 (three) times daily.  . ciprofloxacin (CILOXAN) 0.3 % ophthalmic solution Place 2 drops into both eyes every 4 (four) hours while awake. Administer 1 drop, every 2 hours, while awake, for 2 days. Then 1 drop, every 4 hours, while awake, for the next 5 days.  . clonazePAM (KLONOPIN) 0.5 MG tablet Take 1 tablet (0.5 mg total) by mouth at bedtime. For anxiety  . entacapone (COMTAN) 200 MG tablet Take 200 mg by mouth 3 (three) times daily.  . fluconazole (DIFLUCAN) 150 MG tablet Take 1 tablet (150 mg total) by mouth once.  Marland Kitchen GLUCERNA (GLUCERNA) LIQD Take 237 mLs by mouth daily.   Marland Kitchen guaiFENesin (MUCINEX) 600 MG 12 hr tablet Take 1 tablet (600 mg total) by mouth 2 (two) times daily.  . hydrALAZINE (APRESOLINE) 25 MG  tablet Take 25 mg by mouth 3 (three) times daily.  Marland Kitchen ipratropium-albuterol (DUONEB) 0.5-2.5 (3) MG/3ML SOLN Take 3 mLs by nebulization 3 (three) times daily.  Marland Kitchen lamoTRIgine (LAMICTAL) 150 MG tablet Take 1 tablet (150 mg total) by mouth 2 (two) times daily.  Marland Kitchen lisinopril (PRINIVIL,ZESTRIL) 10 MG tablet Take 1 tablet (10 mg total) by mouth daily.  . mineral oil-hydrophilic petrolatum (AQUAPHOR) ointment Apply 1 application topically 2 (two) times daily as needed for dry skin. For dry skin on heels  . Multiple Vitamin (MULTIVITAMIN WITH MINERALS) TABS tablet Take 1 tablet by mouth daily.  . pantoprazole (PROTONIX) 40 MG tablet Take 1 tablet (40 mg total) by mouth daily at 12 noon.  . polyethylene glycol  (MIRALAX / GLYCOLAX) packet Take 17 g by mouth daily.  . QUEtiapine (SEROQUEL) 100 MG tablet Take 1 tablet (100 mg total) by mouth at bedtime.  . Red Yeast Rice Extract (RED YEAST RICE PO) Take 2 tablets by mouth at bedtime.   . sertraline (ZOLOFT) 100 MG tablet Take 2 tablets (200 mg total) by mouth daily.  Cristino Martes Root 500 MG CAPS Take 1 capsule by mouth at bedtime.  . [DISCONTINUED] QUEtiapine (SEROQUEL) 50 MG tablet Take 1 tablet (50 mg total) by mouth at bedtime.  . [DISCONTINUED] traZODone (DESYREL) 100 MG tablet Take 1 tablet (100 mg total) by mouth at bedtime.  . [DISCONTINUED] traZODone (DESYREL) 50 MG tablet Take 1 tablet (50 mg total) by mouth at bedtime.   Recent Results (from the past 2160 hour(s))  URINALYSIS, ROUTINE W REFLEX MICROSCOPIC     Status: Abnormal   Collection Time    07/24/14 11:11 PM      Result Value Ref Range   Color, Urine YELLOW  YELLOW   APPearance CLOUDY (*) CLEAR   Specific Gravity, Urine 1.014  1.005 - 1.030   pH 6.5  5.0 - 8.0   Glucose, UA NEGATIVE  NEGATIVE mg/dL   Hgb urine dipstick NEGATIVE  NEGATIVE   Bilirubin Urine NEGATIVE  NEGATIVE   Ketones, ur NEGATIVE  NEGATIVE mg/dL   Protein, ur NEGATIVE  NEGATIVE mg/dL   Urobilinogen, UA 0.2  0.0 - 1.0 mg/dL   Nitrite NEGATIVE  NEGATIVE   Leukocytes, UA MODERATE (*) NEGATIVE  URINE CULTURE     Status: None   Collection Time    07/24/14 11:11 PM      Result Value Ref Range   Specimen Description URINE, RANDOM     Special Requests NONE     Culture  Setup Time       Value: 07/25/2014 07:34     RIFAMPIN AND GENTAMICIN SHOULD NOT BE USED AS SINGLE DRUGS FOR TREATMENT OF STAPH INFECTIONS.     Performed at [facility]       Value: >=100,000 COLONIES/ML     Performed at [facility]   Culture       Value: ENTEROCOCCUS SPECIES     STAPHYLOCOCCUS AUREUS     Note: RIFAMPIN AND GENTAMICIN SHOULD NOT BE USED AS SINGLE DRUGS FOR TREATMENT OF STAPH INFECTIONS.      Performed at [facility]   Report Status 07/28/2014 FINAL     Organism ID, Bacteria ENTEROCOCCUS SPECIES     Organism ID, Bacteria STAPHYLOCOCCUS AUREUS    URINE MICROSCOPIC-ADD ON     Status: Abnormal   Collection Time    07/24/14 11:11 PM      Result Value Ref Range  Squamous Epithelial / LPF FEW (*) RARE   WBC, UA 7-10  <3 WBC/hpf   Urine-Other FEW YEAST    COMPREHENSIVE METABOLIC PANEL     Status: Abnormal   Collection Time    07/24/14 11:35 PM      Result Value Ref Range   Sodium 133 (*) 137 - 147 mEq/L   Potassium 4.4  3.7 - 5.3 mEq/L   Chloride 96  96 - 112 mEq/L   CO2 22  19 - 32 mEq/L   Glucose, Bld 101 (*) 70 - 99 mg/dL   BUN 22  6 - 23 mg/dL   Creatinine, Ser 1.07  0.50 - 1.35 mg/dL   Calcium 9.2  8.4 - 10.5 mg/dL   Total Protein 6.0  6.0 - 8.3 g/dL   Albumin 3.9  3.5 - 5.2 g/dL   AST 18  0 - 37 U/L   ALT 13  0 - 53 U/L   Alkaline Phosphatase 86  39 - 117 U/L   Total Bilirubin 0.2 (*) 0.3 - 1.2 mg/dL   GFR calc non Af Amer 65 (*) >90 mL/min   GFR calc Af Amer 75 (*) >90 mL/min   Comment: (NOTE)     The eGFR has been calculated using the CKD EPI equation.     This calculation has not been validated in all clinical situations.     eGFR's persistently <90 mL/min signify possible Chronic Kidney     Disease.   Anion gap 15  5 - 15  CBC WITH DIFFERENTIAL     Status: Abnormal   Collection Time    07/24/14 11:35 PM      Result Value Ref Range   WBC 10.4  4.0 - 10.5 K/uL   RBC 4.62  4.22 - 5.81 MIL/uL   Hemoglobin 14.0  13.0 - 17.0 g/dL   HCT 40.4  39.0 - 52.0 %   MCV 87.4  78.0 - 100.0 fL   MCH 30.3  26.0 - 34.0 pg   MCHC 34.7  30.0 - 36.0 g/dL   RDW 13.2  11.5 - 15.5 %   Platelets 228  150 - 400 K/uL   Neutrophils Relative % 79 (*) 43 - 77 %   Neutro Abs 8.2 (*) 1.7 - 7.7 K/uL   Lymphocytes Relative 14  12 - 46 %   Lymphs Abs 1.4  0.7 - 4.0 K/uL   Monocytes Relative 5  3 - 12 %   Monocytes Absolute 0.6  0.1 - 1.0 K/uL   Eosinophils Relative 2  0 -  5 %   Eosinophils Absolute 0.2  0.0 - 0.7 K/uL   Basophils Relative 0  0 - 1 %   Basophils Absolute 0.0  0.0 - 0.1 K/uL    Past Psychiatric History/Hospitalization(s): Patient has significant history of bipolar disorder.  He has multiple psychiatric admission due to decompensation and noncompliance of medication.  Patient has history of aggression and impulsive behavior.  His last psychiatric admission was in 2005. Anxiety: Yes Bipolar Disorder: Yes Depression: No Mania: No Psychosis: No Schizophrenia: No Personality Disorder: No Hospitalization for psychiatric illness: Yes History of Electroconvulsive Shock Therapy: No Prior Suicide Attempts: No  Physical Exam: Constitutional:  BP 142/65  Pulse 64  Ht 5' 10.5" (1.791 m)  Wt 163 lb 12.8 oz (74.299 kg)  BMI 23.16 kg/m2  General Appearance: Patient is pleasant but confused.  He is fairly groomed.  Musculoskeletal: Strength & Muscle Tone: decreased and atrophy Gait & Station:  unsteady Patient leans: Front  Mental status examination Patient is casually dressed and fairly groomed. He maintained fair eye contact. He is superficially cooperative.  He continues to have difficulty remembering things.  His speech is soft and non-spontaneous.   His thought process is slow but logical and goal directed.  He denies any active or passive suicidal thoughts or homicidal thoughts.  He denies any auditory or visual hallucination.  His attention and concentration is poor.  He described his mood is anxious and his affect is mood appropriate.  There were no delusions , paranoia or obsession present at this time.  He has fine tremors and has difficulty walking.  He need walker .  His gait is unsteady.  He's alert and oriented x3.  His insight judgment and impulse control is fair.  Established Problem, Stable/Improving (1), Review of Psycho-Social Stressors (1), Review or order clinical lab tests (1), Review of Last Therapy Session (1), Review of  Medication Regimen & Side Effects (2) and Review of New Medication or Change in Dosage (2)  Assessment: Axis I: Bipolar disorder, cognitive disorder NOS  Axis II: Deferred  Axis III: See medical history  Axis IV: Moderate  Axis V: 50-55   Plan:  I reviewed  Notes from h emergency room visits and his blood work results.  He is taking more Klonopin because of sleep issues.  I recommended not to take more Klonopin and I would increase Seroquel 100 mg at bedtime to help his sleep.  He will continue to take Zoloft 200 mg at bedtime, trazodone 50 mg at bedtime,  limit to 150 mg twice a day and Klonopin 0.5 mg at bedtime.  Discussed risks and benefits of medication and recommended to call us back if he has any question or a concern.  Followup in 6 weeks. Time spent 25 minutes.  More than 50% of the time spent in psychoeducation, counseling and coordination of care.  Discuss safety plan that anytime having active suicidal thoughts or homicidal thoughts then patient need to call 911 or go to the local emergency room.   ARFEEN,SYED T., MD 08/11/2014

## 2014-08-12 ENCOUNTER — Telehealth: Payer: Self-pay | Admitting: Neurology

## 2014-08-12 ENCOUNTER — Telehealth (HOSPITAL_COMMUNITY): Payer: Self-pay | Admitting: Psychiatry

## 2014-08-12 NOTE — Telephone Encounter (Signed)
Left msg to return call, Dr. Jannifer Franklin has 11:00 this morning and an opening on 9/3.

## 2014-08-12 NOTE — Telephone Encounter (Signed)
I returned patient's ex-wife phone call who is also power of attorney for the patient .  She is concerned about patient's physical health and mental health.  I explained that patient has memory impairment and he has multiple health issues.  Patient isn't scheduled to see a neurologist in October and I suggested to have this appointment sooner because of more decline in his memory impairment.  Patient also has multiple UTIs.  He is taking antibiotic.  She will try to get an earlier appointment with a neurologist for the management of cognitive impairment.

## 2014-08-12 NOTE — Telephone Encounter (Signed)
Patient's ex-wife and power of attorney calling to state that patient saw Dr. Adele Schilder on 8/25 who suggested that patient be seen by his neurologist as soon as possible due to decrease in condition. Requesting sooner appointment than scheduled in October. Medication Seroquel was increased. Best number to call back is [phone]

## 2014-08-21 ENCOUNTER — Telehealth: Payer: Self-pay | Admitting: Neurology

## 2014-08-21 ENCOUNTER — Encounter: Payer: Self-pay | Admitting: Neurology

## 2014-08-21 ENCOUNTER — Ambulatory Visit (INDEPENDENT_AMBULATORY_CARE_PROVIDER_SITE_OTHER): Payer: Medicare Other

## 2014-08-21 ENCOUNTER — Ambulatory Visit (INDEPENDENT_AMBULATORY_CARE_PROVIDER_SITE_OTHER): Payer: Medicare Other | Admitting: Neurology

## 2014-08-21 VITALS — BP 127/74 | HR 56 | Wt 164.0 lb

## 2014-08-21 DIAGNOSIS — R269 Unspecified abnormalities of gait and mobility: Secondary | ICD-10-CM

## 2014-08-21 DIAGNOSIS — R4182 Altered mental status, unspecified: Secondary | ICD-10-CM

## 2014-08-21 DIAGNOSIS — G20 Parkinson's disease: Secondary | ICD-10-CM

## 2014-08-21 DIAGNOSIS — R413 Other amnesia: Secondary | ICD-10-CM

## 2014-08-21 DIAGNOSIS — G20A1 Parkinson's disease without dyskinesia, without mention of fluctuations: Secondary | ICD-10-CM

## 2014-08-21 DIAGNOSIS — R404 Transient alteration of awareness: Secondary | ICD-10-CM

## 2014-08-21 HISTORY — DX: Other amnesia: R41.3

## 2014-08-21 NOTE — Progress Notes (Signed)
Reason for visit: Parkinson's disease  [name] is an 79 y.o. male  History of present illness:   [name] is a 79 year old right-handed white male with a history of Parkinson's disease associated with a gait disorder, and a history of a memory disturbance. The patient has been doing relatively well, but there have been some concerns of episodes of confusion recently. The patient has tried to go to the hospital because of some sensation of visual blurring, and the patient seems to be confused around this time. The patient has been having some issues with sleeping at night, and his Seroquel dose was recently increased with some benefit. He is now being followed through psychiatry. He is sent to this office for an evaluation of the concerns of his mental status. In the past, the patient has had wide variations in his cognitive functioning level. In May of 2014, he was scoring in a moderate range of dementia, but on his last visit and on this visit, his Mini-Mental status examinations have been stable, indicating a mild memory disturbance. The patient denies any new numbness or weakness of the face, arms, or legs. He has not had any falls. He has bladder control issues, and he wears a leg bag at all times. The patient denies any significant change in his overall functional level.  Past Medical History  Diagnosis Date  . Hearing aid worn   . Hypertension   . Parkinson disease   . Pneumonia   . Diabetes mellitus type II   . Arthritis   . Bipolar disorder   . Anxiety   . Depression   . MRSA infection (methicillin-resistant Staphylococcus aureus)   . Chronic indwelling Foley catheter   . C2 cervical fracture     due to pt fall  . SIRS (systemic inflammatory response syndrome)   . Neuromuscular disorder   . Memory loss   . Benign prostate hyperplasia   . Memory difficulty 08/21/2014    Past Surgical History  Procedure Laterality Date  . Total hip arthroplasty    . Video  bronchoscopy  12/16/2012    Procedure: VIDEO BRONCHOSCOPY;  Surgeon: Melrose Nakayama, MD;  Location: [facility];  Service: Thoracic;  Laterality: Right;  . Video assisted thoracoscopy (vats)/empyema  12/16/2012    Procedure: VIDEO ASSISTED THORACOSCOPY (VATS)/EMPYEMA;  Surgeon: Melrose Nakayama, MD;  Location: [facility];  Service: Thoracic;  Laterality: Right;    Family History  Problem Relation Age of Onset  . Depression Father   . Cancer Mother   . Heart disease Mother     Social history:  reports that he has never smoked. He has never used smokeless tobacco. He reports that he does not drink alcohol or use illicit drugs.    Allergies  Allergen Reactions  . Cholestatin Other (See Comments)    Per MAR    Medications:  Current Outpatient Prescriptions on File Prior to Visit  Medication Sig Dispense Refill  . acetaminophen (TYLENOL) 325 MG tablet Take 650 mg by mouth every 6 (six) hours as needed for mild pain.      Marland Kitchen albuterol (PROVENTIL) (2.5 MG/3ML) 0.083% nebulizer solution Take 3 mLs (2.5 mg total) by nebulization every 2 (two) hours as needed for wheezing or shortness of breath.  75 mL  12  . amLODipine (NORVASC) 5 MG tablet Take 1 tablet (5 mg total) by mouth daily.      Marland Kitchen aspirin 81 MG chewable tablet Chew 81 mg by mouth daily.      Marland Kitchen  carbidopa-levodopa (SINEMET IR) 25-250 MG per tablet Take 1 tablet by mouth 3 (three) times daily.      . ciprofloxacin (CILOXAN) 0.3 % ophthalmic solution Place 2 drops into both eyes every 4 (four) hours while awake. Administer 1 drop, every 2 hours, while awake, for 2 days. Then 1 drop, every 4 hours, while awake, for the next 5 days.      . clonazePAM (KLONOPIN) 0.5 MG tablet Take 1 tablet (0.5 mg total) by mouth at bedtime. For anxiety  30 tablet  2  . entacapone (COMTAN) 200 MG tablet Take 200 mg by mouth 3 (three) times daily.      . fluconazole (DIFLUCAN) 150 MG tablet Take 1 tablet (150 mg total) by mouth once.  1 tablet  0  . GLUCERNA  (GLUCERNA) LIQD Take 237 mLs by mouth daily.       Marland Kitchen guaiFENesin (MUCINEX) 600 MG 12 hr tablet Take 1 tablet (600 mg total) by mouth 2 (two) times daily.      . hydrALAZINE (APRESOLINE) 25 MG tablet Take 25 mg by mouth 3 (three) times daily.      Marland Kitchen ipratropium-albuterol (DUONEB) 0.5-2.5 (3) MG/3ML SOLN Take 3 mLs by nebulization 3 (three) times daily.  360 mL    . lamoTRIgine (LAMICTAL) 150 MG tablet Take 1 tablet (150 mg total) by mouth 2 (two) times daily.  180 tablet  0  . lisinopril (PRINIVIL,ZESTRIL) 10 MG tablet Take 1 tablet (10 mg total) by mouth daily.      . mineral oil-hydrophilic petrolatum (AQUAPHOR) ointment Apply 1 application topically 2 (two) times daily as needed for dry skin. For dry skin on heels      . Multiple Vitamin (MULTIVITAMIN WITH MINERALS) TABS tablet Take 1 tablet by mouth daily.      . pantoprazole (PROTONIX) 40 MG tablet Take 1 tablet (40 mg total) by mouth daily at 12 noon.  15 tablet    . polyethylene glycol (MIRALAX / GLYCOLAX) packet Take 17 g by mouth daily.      . QUEtiapine (SEROQUEL) 100 MG tablet Take 1 tablet (100 mg total) by mouth at bedtime.  30 tablet  1  . Red Yeast Rice Extract (RED YEAST RICE PO) Take 2 tablets by mouth at bedtime.       . sertraline (ZOLOFT) 100 MG tablet Take 2 tablets (200 mg total) by mouth daily.  180 tablet  0  . traZODone (DESYREL) 50 MG tablet Take 50 mg by mouth at bedtime.      Cristino Martes Root 500 MG CAPS Take 1 capsule by mouth at bedtime.       No current facility-administered medications on file prior to visit.    ROS:  Out of a complete 14 system review of symptoms, the patient complains only of the following symptoms, and all other reviewed systems are negative.  Fatigue Eye redness, blurred vision Frequent waking, daytime sleepiness, snoring Joint pain, neck pain, neck stiffness Memory loss, dizziness Agitation, confusion, depression, anxiety, OCD  Blood pressure 127/74, pulse 56, weight 164 lb (74.39  kg).  Physical Exam  General: The patient is alert and cooperative at the time of the examination.  Neuromuscular: The patient has some restriction of abduction of the left shoulder.  Skin: No significant peripheral edema is noted.   Neurologic Exam  Mental status: The Mini-Mental status examination done today shows a total score 25/30.  Cranial nerves: Facial symmetry is present. Speech is normal, no aphasia or dysarthria is  noted. Extraocular movements are full. Visual fields are full.  Motor: The patient has good strength in all 4 extremities.  Sensory examination: Soft touch sensation is symmetric on the face, arms, or legs.  Coordination: The patient has good finger-nose-finger and heel-to-shin bilaterally.  Gait and station: The patient has a normal gait. The patient walks with a walker. Tandem gait is unsteady. Romberg is negative. No drift is seen.  Reflexes: Deep tendon reflexes are symmetric.   Assessment/Plan:  1. Parkinsonism  2. Gait disturbance  3. Memory disturbance  Currently, the patient is at or near his baseline. His mental status is normal for him, and his ability to ambulate is at baseline. I will not change medications at this time. There is some concern the patient may be having intermittent episodes of confusion, I will order an EEG study at this time. The patient will followup in 5 months. In the past, the patient has had wide variations in his cognitive functioning level. The patient does likely have a mild dementia, but his memory appears to be quite stable over time.  Jill Alexanders MD 08/22/2014 9:33 AM  [facility] [address] [place], [place] [address]  Phone [phone] Fax [phone]

## 2014-08-21 NOTE — Patient Instructions (Signed)

## 2014-08-21 NOTE — Procedures (Signed)
    History:  [name] is a 79 year old gentleman with a history of Parkinson's disease and bipolar disorder. The patient has had episodes of transient confusion. The patient is being evaluated for these episodes.  This is a routine EEG. No skull defects are noted. Medications include albuterol, Norvasc, aspirin, Sinemet, clonazepam, Comtan, Diflucan, guaifenesin, hydralazine, Lamictal, lisinopril, multivitamins, Protonix, Seroquel, red yeast rice, Zoloft, trazodone, and valerian root.   EEG classification: Normal awake  Description of the recording: The background rhythms of this recording consists of a fairly well modulated medium amplitude alpha rhythm of 8 Hz that is reactive to eye opening and closure. As the record progresses, the patient appears to remain in the waking state throughout the recording. Photic stimulation was performed, resulting in a bilateral and symmetric photic driving response. Hyperventilation was not performed. At no time during the recording does there appear to be evidence of spike or spike wave discharges or evidence of focal slowing. EKG monitor shows no evidence of cardiac rhythm abnormalities with the exception of occasional intraventricular complexes, with a heart rate of 56.  Impression: This is a normal EEG recording in the waking state. No evidence of ictal or interictal discharges are seen.

## 2014-08-21 NOTE — Telephone Encounter (Signed)
I called the patient, EEG study is unremarkable. I discussed this with his wife.

## 2014-09-01 ENCOUNTER — Telehealth (HOSPITAL_COMMUNITY): Payer: Self-pay

## 2014-09-08 NOTE — Telephone Encounter (Signed)
I returned a phone call to his therapist Oneida Arenas however he is not available.  I left a message to call us back.

## 2014-09-10 ENCOUNTER — Emergency Department (HOSPITAL_COMMUNITY)
Admission: EM | Admit: 2014-09-10 | Discharge: 2014-09-10 | Disposition: A | Discharge status: Home / Self Care | Payer: Medicare Other | Attending: Emergency Medicine | Admitting: Emergency Medicine

## 2014-09-10 ENCOUNTER — Telehealth (HOSPITAL_COMMUNITY): Payer: Self-pay

## 2014-09-10 ENCOUNTER — Encounter (HOSPITAL_COMMUNITY): Payer: Self-pay | Admitting: Emergency Medicine

## 2014-09-10 DIAGNOSIS — F329 Major depressive disorder, single episode, unspecified: Secondary | ICD-10-CM | POA: Diagnosis present

## 2014-09-10 DIAGNOSIS — N400 Enlarged prostate without lower urinary tract symptoms: Secondary | ICD-10-CM | POA: Diagnosis not present

## 2014-09-10 DIAGNOSIS — Z7982 Long term (current) use of aspirin: Secondary | ICD-10-CM | POA: Insufficient documentation

## 2014-09-10 DIAGNOSIS — F32A Depression, unspecified: Secondary | ICD-10-CM

## 2014-09-10 DIAGNOSIS — E119 Type 2 diabetes mellitus without complications: Secondary | ICD-10-CM | POA: Insufficient documentation

## 2014-09-10 DIAGNOSIS — Z9889 Other specified postprocedural states: Secondary | ICD-10-CM | POA: Diagnosis not present

## 2014-09-10 DIAGNOSIS — F319 Bipolar disorder, unspecified: Secondary | ICD-10-CM | POA: Diagnosis not present

## 2014-09-10 DIAGNOSIS — F3289 Other specified depressive episodes: Secondary | ICD-10-CM | POA: Insufficient documentation

## 2014-09-10 DIAGNOSIS — Z8614 Personal history of Methicillin resistant Staphylococcus aureus infection: Secondary | ICD-10-CM | POA: Insufficient documentation

## 2014-09-10 DIAGNOSIS — Z87828 Personal history of other (healed) physical injury and trauma: Secondary | ICD-10-CM | POA: Diagnosis not present

## 2014-09-10 DIAGNOSIS — F411 Generalized anxiety disorder: Secondary | ICD-10-CM | POA: Diagnosis not present

## 2014-09-10 DIAGNOSIS — M129 Arthropathy, unspecified: Secondary | ICD-10-CM | POA: Insufficient documentation

## 2014-09-10 DIAGNOSIS — Z8701 Personal history of pneumonia (recurrent): Secondary | ICD-10-CM | POA: Diagnosis not present

## 2014-09-10 DIAGNOSIS — Z79899 Other long term (current) drug therapy: Secondary | ICD-10-CM | POA: Insufficient documentation

## 2014-09-10 DIAGNOSIS — I10 Essential (primary) hypertension: Secondary | ICD-10-CM | POA: Insufficient documentation

## 2014-09-10 LAB — COMPREHENSIVE METABOLIC PANEL
ALT: 10 U/L (ref 0–53)
AST: 15 U/L (ref 0–37)
Albumin: 4 g/dL (ref 3.5–5.2)
Alkaline Phosphatase: 104 U/L (ref 39–117)
Anion gap: 12 (ref 5–15)
BUN: 21 mg/dL (ref 6–23)
CO2: 24 mEq/L (ref 19–32)
Calcium: 9 mg/dL (ref 8.4–10.5)
Chloride: 99 mEq/L (ref 96–112)
Creatinine, Ser: 1.17 mg/dL (ref 0.50–1.35)
GFR calc Af Amer: 68 mL/min — ABNORMAL LOW (ref >90)
GFR calc non Af Amer: 58 mL/min — ABNORMAL LOW (ref >90)
Glucose, Bld: 114 mg/dL — ABNORMAL HIGH (ref 70–99)
Potassium: 4.6 mEq/L (ref 3.7–5.3)
Sodium: 135 mEq/L — ABNORMAL LOW (ref 137–147)
Total Bilirubin: 0.2 mg/dL — ABNORMAL LOW (ref 0.3–1.2)
Total Protein: 6.3 g/dL (ref 6.0–8.3)

## 2014-09-10 LAB — RAPID URINE DRUG SCREEN, HOSP PERFORMED
Amphetamines: NOT DETECTED
Barbiturates: NOT DETECTED
Benzodiazepines: NOT DETECTED
Cocaine: NOT DETECTED
Opiates: NOT DETECTED
Tetrahydrocannabinol: NOT DETECTED

## 2014-09-10 LAB — CBC
HCT: 40.7 % (ref 39.0–52.0)
Hemoglobin: 13.8 g/dL (ref 13.0–17.0)
MCH: 30.4 pg (ref 26.0–34.0)
MCHC: 33.9 g/dL (ref 30.0–36.0)
MCV: 89.6 fL (ref 78.0–100.0)
Platelets: 233 10*3/uL (ref 150–400)
RBC: 4.54 MIL/uL (ref 4.22–5.81)
RDW: 14.1 % (ref 11.5–15.5)
WBC: 8.5 10*3/uL (ref 4.0–10.5)

## 2014-09-10 LAB — SALICYLATE LEVEL: Salicylate Lvl: <2 mg/dL — ABNORMAL LOW (ref 2.8–20.0)

## 2014-09-10 LAB — ACETAMINOPHEN LEVEL: Acetaminophen (Tylenol), Serum: <15 ug/mL (ref 10–30)

## 2014-09-10 LAB — ETHANOL: Alcohol, Ethyl (B): <11 mg/dL (ref 0–11)

## 2014-09-10 MED ORDER — ONDANSETRON HCL 4 MG PO TABS: 4.0000 mg | ORAL_TABLET | Freq: Three times a day (TID) | ORAL | Status: DC | PRN

## 2014-09-10 MED ORDER — ACETAMINOPHEN 325 MG PO TABS: 650.0000 mg | ORAL_TABLET | ORAL | Status: DC | PRN

## 2014-09-10 MED ORDER — LORAZEPAM 1 MG PO TABS: 1.0000 mg | ORAL_TABLET | Freq: Three times a day (TID) | ORAL | Status: DC | PRN

## 2014-09-10 MED ORDER — IBUPROFEN 200 MG PO TABS: 600.0000 mg | ORAL_TABLET | Freq: Three times a day (TID) | ORAL | Status: DC | PRN

## 2014-09-10 NOTE — ED Provider Notes (Signed)
CSN: [id number]     Arrival date & time 09/10/14  1848 History   First MD Initiated Contact with Patient 09/10/14 1926     Chief Complaint  Patient presents with  . Depression  . Medical Clearance     (Consider location/radiation/quality/duration/timing/severity/associated sxs/prior Treatment) HPI Comments: Patient with a history of Anxiety, Depression, and Bipolar presents today from [facility] after taking comments of wanting to commit suicide earlier today.  He did not have a suicidal plan.  Patient denies any suicidal ideation at this time.  He states, "I will never commit suicide."  He denies HI.  He does report that he has been more depressed recently.  He reports that he is compliant with his psychiatric medications.  He denies any physical symptoms at this time.  Denies alcohol or recreational drug use.  The history is provided by the patient.    Past Medical History  Diagnosis Date  . Hearing aid worn   . Hypertension   . Parkinson disease   . Pneumonia   . Diabetes mellitus type II   . Arthritis   . Bipolar disorder   . Anxiety   . Depression   . MRSA infection (methicillin-resistant Staphylococcus aureus)   . Chronic indwelling Foley catheter   . C2 cervical fracture     due to pt fall  . SIRS (systemic inflammatory response syndrome)   . Neuromuscular disorder   . Memory loss   . Benign prostate hyperplasia   . Memory difficulty 08/21/2014   Past Surgical History  Procedure Laterality Date  . Total hip arthroplasty    . Video bronchoscopy  12/16/2012    Procedure: VIDEO BRONCHOSCOPY;  Surgeon: Melrose Nakayama, MD;  Location: [facility];  Service: Thoracic;  Laterality: Right;  . Video assisted thoracoscopy (vats)/empyema  12/16/2012    Procedure: VIDEO ASSISTED THORACOSCOPY (VATS)/EMPYEMA;  Surgeon: Melrose Nakayama, MD;  Location: [facility];  Service: Thoracic;  Laterality: Right;   Family History  Problem Relation Age of Onset  . Depression Father   .  Cancer Mother   . Heart disease Mother    History  Substance Use Topics  . Smoking status: Never Smoker   . Smokeless tobacco: Never Used  . Alcohol Use: No     Comment: occasional    Review of Systems    Allergies  Cholestatin  Home Medications   Prior to Admission medications   Medication Sig Start Date End Date Taking? Authorizing Provider  amLODipine (NORVASC) 5 MG tablet Take 1 tablet (5 mg total) by mouth daily. 02/03/13  Yes Janece Canterbury, MD  acetaminophen (TYLENOL) 325 MG tablet Take 650 mg by mouth every 6 (six) hours as needed for mild pain.    Historical Provider, MD  albuterol (PROVENTIL) (2.5 MG/3ML) 0.083% nebulizer solution Take 3 mLs (2.5 mg total) by nebulization every 2 (two) hours as needed for wheezing or shortness of breath. 04/21/14   Hosie Poisson, MD  aspirin 81 MG chewable tablet Chew 81 mg by mouth daily.    Historical Provider, MD  carbidopa-levodopa (SINEMET IR) 25-250 MG per tablet Take 1 tablet by mouth 3 (three) times daily.    Historical Provider, MD  ciprofloxacin (CILOXAN) 0.3 % ophthalmic solution Place 2 drops into both eyes every 4 (four) hours while awake. Administer 1 drop, every 2 hours, while awake, for 2 days. Then 1 drop, every 4 hours, while awake, for the next 5 days.    Historical Provider, MD  clonazePAM Bobbye Charleston) 0.5  MG tablet Take 1 tablet (0.5 mg total) by mouth at bedtime. For anxiety 07/01/14   Kathlee Nations, MD  entacapone (COMTAN) 200 MG tablet Take 200 mg by mouth 3 (three) times daily.    Historical Provider, MD  fluconazole (DIFLUCAN) 150 MG tablet Take 1 tablet (150 mg total) by mouth once. 07/25/14   Johnna Acosta, MD  GLUCERNA [facility]) LIQD Take 237 mLs by mouth daily.     Historical Provider, MD  guaiFENesin (MUCINEX) 600 MG 12 hr tablet Take 1 tablet (600 mg total) by mouth 2 (two) times daily. 04/21/14   Hosie Poisson, MD  hydrALAZINE (APRESOLINE) 25 MG tablet Take 25 mg by mouth 3 (three) times daily.    Historical  Provider, MD  ipratropium-albuterol (DUONEB) 0.5-2.5 (3) MG/3ML SOLN Take 3 mLs by nebulization 3 (three) times daily. 04/21/14   Hosie Poisson, MD  lamoTRIgine (LAMICTAL) 150 MG tablet Take 1 tablet (150 mg total) by mouth 2 (two) times daily. 07/01/14   Kathlee Nations, MD  lisinopril (PRINIVIL,ZESTRIL) 10 MG tablet Take 1 tablet (10 mg total) by mouth daily. 02/03/13   Janece Canterbury, MD  mineral oil-hydrophilic petrolatum (AQUAPHOR) ointment Apply 1 application topically 2 (two) times daily as needed for dry skin. For dry skin on heels    Historical Provider, MD  Multiple Vitamin (MULTIVITAMIN WITH MINERALS) TABS tablet Take 1 tablet by mouth daily.    Historical Provider, MD  pantoprazole (PROTONIX) 40 MG tablet Take 1 tablet (40 mg total) by mouth daily at 12 noon. 04/21/14   Hosie Poisson, MD  polyethylene glycol (MIRALAX / GLYCOLAX) packet Take 17 g by mouth daily.    Historical Provider, MD  QUEtiapine (SEROQUEL) 100 MG tablet Take 1 tablet (100 mg total) by mouth at bedtime. 08/11/14   Kathlee Nations, MD  Red Yeast Rice Extract (RED YEAST RICE PO) Take 2 tablets by mouth at bedtime.     Historical Provider, MD  sertraline (ZOLOFT) 100 MG tablet Take 2 tablets (200 mg total) by mouth daily. 07/01/14   Kathlee Nations, MD  traZODone (DESYREL) 50 MG tablet Take 50 mg by mouth at bedtime.    Historical Provider, MD  Valerian Root 500 MG CAPS Take 1 capsule by mouth at bedtime.    Historical Provider, MD   BP 172/71  Pulse 65  Temp(Src) 97.7 F (36.5 C) (Oral)  Resp 16  SpO2 100% Physical Exam  Nursing note and vitals reviewed. Constitutional: He appears well-developed and well-nourished.  HENT:  Head: Normocephalic and atraumatic.  Neck: Normal range of motion. Neck supple.  Cardiovascular: Normal rate, regular rhythm and normal heart sounds.   Pulmonary/Chest: Effort normal and breath sounds normal.  Abdominal: Soft. There is no tenderness.  Musculoskeletal: Normal range of motion.   Neurological: He is alert.  Skin: Skin is warm and dry.  Psychiatric: He has a normal mood and affect. His speech is normal and behavior is normal. Thought content is not paranoid and not delusional. He expresses no homicidal and no suicidal ideation. He expresses no suicidal plans and no homicidal plans.    ED Course  Procedures (including critical care time) Labs Review Labs Reviewed  CBC  ACETAMINOPHEN LEVEL  COMPREHENSIVE METABOLIC PANEL  ETHANOL  SALICYLATE LEVEL  URINE RAPID DRUG SCREEN (HOSP PERFORMED)    Imaging Review No results found.   EKG Interpretation None      MDM   Final diagnoses:  None   Patient brought in today due  to suicidal thoughts that he made earlier today.  Patient denies SI at this time and states, "I would never commit suicide.  Patient denies HI.  No physical complaints.  Labs unremarkable.  Patient also evaluated by Dr. Wilson Singer.  Patient reports that he does not want to stay in the ED for a Psych consult.  Patient stable for discharge.  Return precautions given.  Hyman Bible, PA-C 09/10/14 2049

## 2014-09-10 NOTE — Discharge Instructions (Signed)

## 2014-09-10 NOTE — ED Notes (Signed)
Pt comfortable with discharge and follow up instructions. Pt cleared for discharge per Dr. Wilson Singer. No prescriptions given.

## 2014-09-10 NOTE — ED Notes (Signed)
Pt c/o increasing depression and made SI comments at assisted living today; pt denies SI at present; pt caregiver sts pt may need medication adjustment

## 2014-09-10 NOTE — ED Provider Notes (Signed)
Medical screening examination/treatment/procedure(s) were conducted as a shared visit with non-physician practitioner(s) and myself.  I personally evaluated the patient during the encounter.   EKG Interpretation None     79yM sent for evaluation after being overheard saying he wanted to kill himself. Pt says he did say this, but that he would never actually hurt himself or anyone else. Has established psychiatric care. Offered to have him speak with someone in terms of his depression or for possible medication changes but he would prefer to follow-up with someone who knows him well. I think this is reasonable.   Virgel Manifold, MD 09/10/14 2034

## 2014-09-11 NOTE — ED Provider Notes (Signed)
Medical screening examination/treatment/procedure(s) were conducted as a shared visit with non-physician practitioner(s) and myself.  I personally evaluated the patient during the encounter.   EKG Interpretation None       Virgel Manifold, MD 09/11/14 1447

## 2014-09-11 NOTE — Telephone Encounter (Signed)
RN Edwena Felty T. Called from [facility] left message stating Chayson needs more medication due to having suicidal thoughts but no plan. She states she had called an ER to get him help but he refused to go.

## 2014-09-14 ENCOUNTER — Telehealth (HOSPITAL_COMMUNITY): Payer: Self-pay | Admitting: Psychiatry

## 2014-09-14 ENCOUNTER — Telehealth (HOSPITAL_COMMUNITY): Payer: Self-pay

## 2014-09-14 NOTE — Telephone Encounter (Signed)
I returned patient's phone call however he was not present.  I spoke to receptionist Faythe Dingwall who mentioned that patient has doctor's appointment.  Recommended to call as back.

## 2014-09-15 NOTE — Telephone Encounter (Signed)
I returned patient's phone call and left a message.  However patient has scheduled to see me in 2 weeks.

## 2014-09-17 ENCOUNTER — Telehealth (HOSPITAL_COMMUNITY): Payer: Self-pay | Admitting: Psychiatry

## 2014-09-17 NOTE — Telephone Encounter (Signed)
I returned patient's call I left a message

## 2014-09-21 ENCOUNTER — Emergency Department (HOSPITAL_COMMUNITY): Payer: Medicare Other

## 2014-09-21 ENCOUNTER — Emergency Department (HOSPITAL_COMMUNITY)
Admission: EM | Admit: 2014-09-21 | Discharge: 2014-09-22 | Disposition: A | Discharge status: Home / Self Care | Payer: Medicare Other | Attending: Emergency Medicine | Admitting: Emergency Medicine

## 2014-09-21 ENCOUNTER — Encounter (HOSPITAL_COMMUNITY): Payer: Self-pay | Admitting: Emergency Medicine

## 2014-09-21 DIAGNOSIS — Z043 Encounter for examination and observation following other accident: Secondary | ICD-10-CM | POA: Insufficient documentation

## 2014-09-21 DIAGNOSIS — Z87438 Personal history of other diseases of male genital organs: Secondary | ICD-10-CM | POA: Insufficient documentation

## 2014-09-21 DIAGNOSIS — F0280 Dementia in other diseases classified elsewhere without behavioral disturbance: Secondary | ICD-10-CM | POA: Insufficient documentation

## 2014-09-21 DIAGNOSIS — I10 Essential (primary) hypertension: Secondary | ICD-10-CM | POA: Diagnosis not present

## 2014-09-21 DIAGNOSIS — Z79899 Other long term (current) drug therapy: Secondary | ICD-10-CM | POA: Diagnosis not present

## 2014-09-21 DIAGNOSIS — Z8619 Personal history of other infectious and parasitic diseases: Secondary | ICD-10-CM | POA: Insufficient documentation

## 2014-09-21 DIAGNOSIS — W19XXXA Unspecified fall, initial encounter: Secondary | ICD-10-CM

## 2014-09-21 DIAGNOSIS — Z7982 Long term (current) use of aspirin: Secondary | ICD-10-CM | POA: Insufficient documentation

## 2014-09-21 DIAGNOSIS — Z8701 Personal history of pneumonia (recurrent): Secondary | ICD-10-CM | POA: Diagnosis not present

## 2014-09-21 DIAGNOSIS — M1990 Unspecified osteoarthritis, unspecified site: Secondary | ICD-10-CM | POA: Diagnosis not present

## 2014-09-21 DIAGNOSIS — Z8781 Personal history of (healed) traumatic fracture: Secondary | ICD-10-CM | POA: Diagnosis not present

## 2014-09-21 DIAGNOSIS — Z974 Presence of external hearing-aid: Secondary | ICD-10-CM | POA: Diagnosis not present

## 2014-09-21 DIAGNOSIS — F419 Anxiety disorder, unspecified: Secondary | ICD-10-CM | POA: Insufficient documentation

## 2014-09-21 DIAGNOSIS — W1839XA Other fall on same level, initial encounter: Secondary | ICD-10-CM | POA: Diagnosis not present

## 2014-09-21 DIAGNOSIS — E119 Type 2 diabetes mellitus without complications: Secondary | ICD-10-CM | POA: Diagnosis not present

## 2014-09-21 DIAGNOSIS — F319 Bipolar disorder, unspecified: Secondary | ICD-10-CM | POA: Diagnosis not present

## 2014-09-21 DIAGNOSIS — Y92128 Other place in nursing home as the place of occurrence of the external cause: Secondary | ICD-10-CM | POA: Insufficient documentation

## 2014-09-21 DIAGNOSIS — G3183 Dementia with Lewy bodies: Secondary | ICD-10-CM | POA: Insufficient documentation

## 2014-09-21 DIAGNOSIS — Z8614 Personal history of Methicillin resistant Staphylococcus aureus infection: Secondary | ICD-10-CM | POA: Diagnosis not present

## 2014-09-21 NOTE — ED Notes (Addendum)
Patient arrives by EMS from [facility] with complaints of a fall x 2 this evening.  Patient with no obvious injuries or complaints.  Falls unwitnessed-patient states he just "fell out of his wheelchair".  Patient has a chronic sore on his right side-complaining of pain to this area

## 2014-09-21 NOTE — ED Provider Notes (Signed)
CSN: [id number]     Arrival date & time 09/21/14  2228 History   First MD Initiated Contact with Patient 09/21/14 2256     Chief Complaint  Patient presents with  . Fall     (Consider location/radiation/quality/duration/timing/severity/associated sxs/prior Treatment) Patient is a 79 y.o. male presenting with fall.  Fall This is a recurrent problem. The current episode started less than 1 hour ago. The problem occurs constantly. The problem has not changed since onset.Pertinent negatives include no chest pain, no abdominal pain, no headaches and no shortness of breath. Nothing aggravates the symptoms. Nothing relieves the symptoms. He has tried nothing for the symptoms.    Past Medical History  Diagnosis Date  . Hearing aid worn   . Hypertension   . Parkinson disease   . Pneumonia   . Diabetes mellitus type II   . Arthritis   . Bipolar disorder   . Anxiety   . Depression   . MRSA infection (methicillin-resistant Staphylococcus aureus)   . Chronic indwelling Foley catheter   . C2 cervical fracture     due to pt fall  . SIRS (systemic inflammatory response syndrome)   . Neuromuscular disorder   . Memory loss   . Benign prostate hyperplasia   . Memory difficulty 08/21/2014   Past Surgical History  Procedure Laterality Date  . Total hip arthroplasty    . Video bronchoscopy  12/16/2012    Procedure: VIDEO BRONCHOSCOPY;  Surgeon: Melrose Nakayama, MD;  Location: [place];  Service: Thoracic;  Laterality: Right;  . Video assisted thoracoscopy (vats)/empyema  12/16/2012    Procedure: VIDEO ASSISTED THORACOSCOPY (VATS)/EMPYEMA;  Surgeon: Melrose Nakayama, MD;  Location: [place];  Service: Thoracic;  Laterality: Right;   Family History  Problem Relation Age of Onset  . Depression Father   . Cancer Mother   . Heart disease Mother    History  Substance Use Topics  . Smoking status: Never Smoker   . Smokeless tobacco: Never Used  . Alcohol Use: No     Comment: occasional     Review of Systems  Unable to perform ROS: Dementia  Respiratory: Negative for shortness of breath.   Cardiovascular: Negative for chest pain.  Gastrointestinal: Negative for abdominal pain.  Neurological: Negative for headaches.      Allergies  Cholestatin  Home Medications   Prior to Admission medications   Medication Sig Start Date End Date Taking? Authorizing Provider  acetaminophen (TYLENOL) 325 MG tablet Take 650 mg by mouth every 6 (six) hours as needed for mild pain.   Yes Historical Provider, MD  albuterol (PROVENTIL) (2.5 MG/3ML) 0.083% nebulizer solution Take 3 mLs (2.5 mg total) by nebulization every 2 (two) hours as needed for wheezing or shortness of breath. 04/21/14  Yes Hosie Poisson, MD  amLODipine (NORVASC) 5 MG tablet Take 1 tablet (5 mg total) by mouth daily. 02/03/13  Yes Janece Canterbury, MD  aspirin 81 MG chewable tablet Chew 81 mg by mouth daily.   Yes Historical Provider, MD  carbidopa-levodopa (SINEMET IR) 25-250 MG per tablet Take 1 tablet by mouth 3 (three) times daily.   Yes Historical Provider, MD  clonazePAM (KLONOPIN) 0.5 MG tablet Take 1 tablet (0.5 mg total) by mouth at bedtime. For anxiety 07/01/14  Yes Kathlee Nations, MD  entacapone (COMTAN) 200 MG tablet Take 200 mg by mouth 3 (three) times daily.   Yes Historical Provider, MD  GLUCERNA (GLUCERNA) LIQD Take 237 mLs by mouth daily.  Yes Historical Provider, MD  guaiFENesin (MUCINEX) 600 MG 12 hr tablet Take 1 tablet (600 mg total) by mouth 2 (two) times daily. 04/21/14  Yes Hosie Poisson, MD  hydrALAZINE (APRESOLINE) 25 MG tablet Take 25 mg by mouth 3 (three) times daily.   Yes Historical Provider, MD  ipratropium-albuterol (DUONEB) 0.5-2.5 (3) MG/3ML SOLN Take 3 mLs by nebulization 3 (three) times daily. 04/21/14  Yes Hosie Poisson, MD  lamoTRIgine (LAMICTAL) 150 MG tablet Take 1 tablet (150 mg total) by mouth 2 (two) times daily. 07/01/14  Yes Kathlee Nations, MD  lisinopril (PRINIVIL,ZESTRIL) 10 MG tablet  Take 1 tablet (10 mg total) by mouth daily. 02/03/13  Yes Janece Canterbury, MD  mineral oil-hydrophilic petrolatum (AQUAPHOR) ointment Apply 1 application topically 2 (two) times daily as needed for dry skin. For dry skin on heels   Yes Historical Provider, MD  Multiple Vitamin (MULTIVITAMIN WITH MINERALS) TABS tablet Take 1 tablet by mouth daily.   Yes Historical Provider, MD  pantoprazole (PROTONIX) 40 MG tablet Take 1 tablet (40 mg total) by mouth daily at 12 noon. 04/21/14  Yes Hosie Poisson, MD  polyethylene glycol (MIRALAX / GLYCOLAX) packet Take 17 g by mouth daily.   Yes Historical Provider, MD  QUEtiapine (SEROQUEL) 50 MG tablet Take 50 mg by mouth at bedtime.   Yes Historical Provider, MD  Red Yeast Rice Extract (RED YEAST RICE PO) Take 2 tablets by mouth at bedtime.    Yes Historical Provider, MD  sertraline (ZOLOFT) 100 MG tablet Take 2 tablets (200 mg total) by mouth daily. 07/01/14  Yes Kathlee Nations, MD  traZODone (DESYREL) 50 MG tablet Take 100 mg by mouth at bedtime.    Yes Historical Provider, MD  Valerian Root 500 MG CAPS Take 1 capsule by mouth at bedtime.   Yes Historical Provider, MD   BP 97/48  Pulse 72  Temp(Src) 99.7 F (37.6 C) (Oral)  Resp 22  SpO2 96% Physical Exam  Vitals reviewed. Constitutional: He appears well-developed and well-nourished.  HENT:  Head: Normocephalic and atraumatic.  Eyes: Conjunctivae and EOM are normal.  Neck: Normal range of motion. Neck supple.  Cardiovascular: Normal rate, regular rhythm and normal heart sounds.   Pulmonary/Chest: Effort normal and breath sounds normal. No respiratory distress.  Abdominal: He exhibits no distension. There is no tenderness. There is no rebound and no guarding.  Musculoskeletal: Normal range of motion.  Neurological: He is alert. He has normal strength. No cranial nerve deficit or sensory deficit. He exhibits normal muscle tone.  Skin: Skin is warm and dry.    ED Course  Procedures (including critical  care time) Labs Review Labs Reviewed  URINALYSIS, ROUTINE W REFLEX MICROSCOPIC - Abnormal; Notable for the following:    Color, Urine AMBER (*)    Nitrite POSITIVE (*)    Leukocytes, UA MODERATE (*)    All other components within normal limits  URINE MICROSCOPIC-ADD ON - Abnormal; Notable for the following:    Bacteria, UA FEW (*)    All other components within normal limits    Imaging Review No results found.   EKG Interpretation   Date/Time:  Monday September 21 2014 23:08:28 EDT Ventricular Rate:  90 PR Interval:  179 QRS Duration: 103 QT Interval:  361 QTC Calculation: 442 R Axis:   -33 Text Interpretation:  Sinus rhythm Left axis deviation Low voltage,  precordial leads No significant change was found Confirmed by Debby Freiberg [phone]) on 09/22/2014 12:23:18 AM  MDM   Final diagnoses:  Fall, initial encounter    78 y.o. male with pertinent PMH of chronic memory disturbance, parkinsons dz, chronic foley, bipolar do presents after mechanical [place] [facility].  No concussive signs.  Pt well appearing, unable to give clear history of event.  CXR and head CT unremarkable.  No other external signs of trauma, and pt has no complaints at this time.  DC in stable condition.    1. Fall, initial encounter         Debby Freiberg, MD 09/24/14 [phone]

## 2014-09-21 NOTE — ED Notes (Signed)
Patient stated he is ready to go home. Patient says he honestly dont know why he is here.

## 2014-09-21 NOTE — ED Notes (Signed)
Patient uses wheelchair/walker at [facility]

## 2014-09-21 NOTE — ED Notes (Signed)
Bed: WA20 Expected date:  Expected time:  Means of arrival:  Comments: EMS 79 yo male from SNF-fall x 2 this evening-no obvious trauma-no complaints

## 2014-09-22 DIAGNOSIS — Z043 Encounter for examination and observation following other accident: Secondary | ICD-10-CM | POA: Diagnosis not present

## 2014-09-22 LAB — URINALYSIS, ROUTINE W REFLEX MICROSCOPIC
BILIRUBIN URINE: NEGATIVE
Glucose, UA: NEGATIVE mg/dL
HGB URINE DIPSTICK: NEGATIVE
Ketones, ur: NEGATIVE mg/dL
NITRITE: POSITIVE — AB
PROTEIN: NEGATIVE mg/dL
SPECIFIC GRAVITY, URINE: 1.016 (ref 1.005–1.030)
UROBILINOGEN UA: 0.2 mg/dL (ref 0.0–1.0)
pH: 5 (ref 5.0–8.0)

## 2014-09-22 LAB — URINE MICROSCOPIC-ADD ON

## 2014-09-22 NOTE — Discharge Instructions (Signed)

## 2014-09-22 NOTE — ED Notes (Signed)
Patient is waiting on PTAR  To go back to the [facility].

## 2014-10-01 ENCOUNTER — Encounter (HOSPITAL_COMMUNITY): Payer: Self-pay | Admitting: Psychiatry

## 2014-10-01 ENCOUNTER — Ambulatory Visit (INDEPENDENT_AMBULATORY_CARE_PROVIDER_SITE_OTHER): Payer: Medicare Other | Admitting: Psychiatry

## 2014-10-01 VITALS — BP 118/69 | HR 57 | Ht 70.5 in | Wt 160.6 lb

## 2014-10-01 DIAGNOSIS — F09 Unspecified mental disorder due to known physiological condition: Secondary | ICD-10-CM

## 2014-10-01 DIAGNOSIS — F319 Bipolar disorder, unspecified: Secondary | ICD-10-CM

## 2014-10-01 MED ORDER — SERTRALINE HCL 100 MG PO TABS: 200.0000 mg | ORAL_TABLET | Freq: Every day | ORAL | Status: DC

## 2014-10-01 MED ORDER — TRAZODONE HCL 100 MG PO TABS: 100.0000 mg | ORAL_TABLET | Freq: Every day | ORAL | Status: DC

## 2014-10-01 MED ORDER — LAMOTRIGINE 150 MG PO TABS: 150.0000 mg | ORAL_TABLET | Freq: Two times a day (BID) | ORAL | Status: DC

## 2014-10-01 MED ORDER — CLONAZEPAM 0.5 MG PO TABS: ORAL_TABLET | ORAL | Status: DC

## 2014-10-01 MED ORDER — QUETIAPINE FUMARATE 50 MG PO TABS: 50.0000 mg | ORAL_TABLET | Freq: Every day | ORAL | Status: DC

## 2014-10-01 NOTE — Progress Notes (Signed)
[facility] Progress Note  SIMS LADAY [id number] 79 y.o.  10/01/2014 1:59 PM  Chief Complaint:  I am anxious and depressed.          History of Present Illness:  [name] came earlier than his scheduled appointment with his aide.  He has been more anxious and depressed than his usual.  He visited emergency room a few times in recent weeks.  He admitted having passive suicidal thoughts a few weeks ago when he was told to go to emergency room however he did not want to do be admitted.  He is feeling better now.  He is not having any suicidal thoughts but he remains very anxious and nervous.  I have noticed his memory is getting worse.  He asked a question multiple times and he needs a lot of reassurance and redirection.  He is taking multiple medications including Seroquel, Zoloft, trazodone, Lamictal and Klonopin.  His aide mentioned that the patient has been very nervous usually in the afternoon.  I review his medication he used to take Klonopin in the afternoon however he has stopped taking it because he does not want to be sleepy.  She denies any hallucination or any paranoia .  He denies any aggression or violence.  Today he brought a long list of stressors that causing him anxious.  His list includes not happy with the staff at [facility], concern about global activity, limited support system, chronic physical and health issues, and multiple visits to the emergency room and financial strain.  He mentioned that he would never commit suicide or hurt himself in the [facility].  He wants to get better.  He is also concerned about his memory which has been decline in recent months.  He admitted that he is not sharp as he used to be in the past.  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  He is overwhelmed because he is seeing multiple doctors.  However his appetite is okay.  His vitals are stable.  He denies any hallucination or any active or passive suicidal  thoughts.  Suicidal Ideation: No Plan Formed: No Patient has means to carry out plan: No  Homicidal Ideation: No Plan Formed: No Patient has means to carry out plan: No  Review of Systems  HENT: Positive for hearing loss.   Skin: Negative.  Negative for itching and rash.  Neurological:       Memory impairment  Psychiatric/Behavioral: Positive for depression. The patient is nervous/anxious.     Medical history Patient has history of diabetes mellitus, hypertension, Parkinson disease, gait instability, hearing and memory impairment. He has multiple UTIs and he was admitted in May 2015 for UTI.   Outpatient Encounter Prescriptions as of 10/01/2014  Medication Sig  . acetaminophen (TYLENOL) 325 MG tablet Take 650 mg by mouth every 6 (six) hours as needed for mild pain.  Marland Kitchen albuterol (PROVENTIL) (2.5 MG/3ML) 0.083% nebulizer solution Take 3 mLs (2.5 mg total) by nebulization every 2 (two) hours as needed for wheezing or shortness of breath.  Marland Kitchen amLODipine (NORVASC) 5 MG tablet Take 1 tablet (5 mg total) by mouth daily.  Marland Kitchen aspirin 81 MG chewable tablet Chew 81 mg by mouth daily.  . carbidopa-levodopa (SINEMET IR) 25-250 MG per tablet Take 1 tablet by mouth 3 (three) times daily.  . clonazePAM (KLONOPIN) 0.5 MG tablet Take 1/2 tab at noon and 1 tab at bed time  . entacapone (COMTAN) 200  MG tablet Take 200 mg by mouth 3 (three) times daily.  Marland Kitchen GLUCERNA (GLUCERNA) LIQD Take 237 mLs by mouth daily.   Marland Kitchen guaiFENesin (MUCINEX) 600 MG 12 hr tablet Take 1 tablet (600 mg total) by mouth 2 (two) times daily.  . hydrALAZINE (APRESOLINE) 25 MG tablet Take 25 mg by mouth 3 (three) times daily.  Marland Kitchen ipratropium-albuterol (DUONEB) 0.5-2.5 (3) MG/3ML SOLN Take 3 mLs by nebulization 3 (three) times daily.  Marland Kitchen lamoTRIgine (LAMICTAL) 150 MG tablet Take 1 tablet (150 mg total) by mouth 2 (two) times daily.  Marland Kitchen lisinopril (PRINIVIL,ZESTRIL) 10 MG tablet Take 1 tablet (10 mg total) by mouth daily.  . mineral  oil-hydrophilic petrolatum (AQUAPHOR) ointment Apply 1 application topically 2 (two) times daily as needed for dry skin. For dry skin on heels  . Multiple Vitamin (MULTIVITAMIN WITH MINERALS) TABS tablet Take 1 tablet by mouth daily.  . pantoprazole (PROTONIX) 40 MG tablet Take 1 tablet (40 mg total) by mouth daily at 12 noon.  . polyethylene glycol (MIRALAX / GLYCOLAX) packet Take 17 g by mouth daily.  . QUEtiapine (SEROQUEL) 50 MG tablet Take 1 tablet (50 mg total) by mouth at bedtime.  . Red Yeast Rice Extract (RED YEAST RICE PO) Take 2 tablets by mouth at bedtime.   . sertraline (ZOLOFT) 100 MG tablet Take 2 tablets (200 mg total) by mouth daily.  . traZODone (DESYREL) 100 MG tablet Take 1 tablet (100 mg total) by mouth at bedtime.  Cristino Martes Root 500 MG CAPS Take 1 capsule by mouth at bedtime.  . [DISCONTINUED] clonazePAM (KLONOPIN) 0.5 MG tablet Take 1 tablet (0.5 mg total) by mouth at bedtime. For anxiety  . [DISCONTINUED] lamoTRIgine (LAMICTAL) 150 MG tablet Take 1 tablet (150 mg total) by mouth 2 (two) times daily.  . [DISCONTINUED] QUEtiapine (SEROQUEL) 50 MG tablet Take 50 mg by mouth at bedtime.  . [DISCONTINUED] sertraline (ZOLOFT) 100 MG tablet Take 2 tablets (200 mg total) by mouth daily.  . [DISCONTINUED] traZODone (DESYREL) 50 MG tablet Take 100 mg by mouth at bedtime.    Recent Results (from the past 2160 hour(s))  URINALYSIS, ROUTINE W REFLEX MICROSCOPIC     Status: Abnormal   Collection Time    07/24/14 11:11 PM      Result Value Ref Range   Color, Urine YELLOW  YELLOW   APPearance CLOUDY (*) CLEAR   Specific Gravity, Urine 1.014  1.005 - 1.030   pH 6.5  5.0 - 8.0   Glucose, UA NEGATIVE  NEGATIVE mg/dL   Hgb urine dipstick NEGATIVE  NEGATIVE   Bilirubin Urine NEGATIVE  NEGATIVE   Ketones, ur NEGATIVE  NEGATIVE mg/dL   Protein, ur NEGATIVE  NEGATIVE mg/dL   Urobilinogen, UA 0.2  0.0 - 1.0 mg/dL   Nitrite NEGATIVE  NEGATIVE   Leukocytes, UA MODERATE (*) NEGATIVE   URINE CULTURE     Status: None   Collection Time    07/24/14 11:11 PM      Result Value Ref Range   Specimen Description URINE, RANDOM     Special Requests NONE     Culture  Setup Time       Value: 07/25/2014 07:34     RIFAMPIN AND GENTAMICIN SHOULD NOT BE USED AS SINGLE DRUGS FOR TREATMENT OF STAPH INFECTIONS.     Performed at [facility]       Value: >=100,000 COLONIES/ML     Performed at [facility]  Culture       Value: ENTEROCOCCUS SPECIES     STAPHYLOCOCCUS AUREUS     Note: RIFAMPIN AND GENTAMICIN SHOULD NOT BE USED AS SINGLE DRUGS FOR TREATMENT OF STAPH INFECTIONS.     Performed at [facility]   Report Status 07/28/2014 FINAL     Organism ID, Bacteria ENTEROCOCCUS SPECIES     Organism ID, Bacteria STAPHYLOCOCCUS AUREUS    URINE MICROSCOPIC-ADD ON     Status: Abnormal   Collection Time    07/24/14 11:11 PM      Result Value Ref Range   Squamous Epithelial / LPF FEW (*) RARE   WBC, UA 7-10  <3 WBC/hpf   Urine-Other FEW YEAST    COMPREHENSIVE METABOLIC PANEL     Status: Abnormal   Collection Time    07/24/14 11:35 PM      Result Value Ref Range   Sodium 133 (*) 137 - 147 mEq/L   Potassium 4.4  3.7 - 5.3 mEq/L   Chloride 96  96 - 112 mEq/L   CO2 22  19 - 32 mEq/L   Glucose, Bld 101 (*) 70 - 99 mg/dL   BUN 22  6 - 23 mg/dL   Creatinine, Ser 1.07  0.50 - 1.35 mg/dL   Calcium 9.2  8.4 - 10.5 mg/dL   Total Protein 6.0  6.0 - 8.3 g/dL   Albumin 3.9  3.5 - 5.2 g/dL   AST 18  0 - 37 U/L   ALT 13  0 - 53 U/L   Alkaline Phosphatase 86  39 - 117 U/L   Total Bilirubin 0.2 (*) 0.3 - 1.2 mg/dL   GFR calc non Af Amer 65 (*) >90 mL/min   GFR calc Af Amer 75 (*) >90 mL/min   Comment: (NOTE)     The eGFR has been calculated using the CKD EPI equation.     This calculation has not been validated in all clinical situations.     eGFR's persistently <90 mL/min signify possible Chronic Kidney     Disease.   Anion gap 15  5 - 15  CBC WITH  DIFFERENTIAL     Status: Abnormal   Collection Time    07/24/14 11:35 PM      Result Value Ref Range   WBC 10.4  4.0 - 10.5 K/uL   RBC 4.62  4.22 - 5.81 MIL/uL   Hemoglobin 14.0  13.0 - 17.0 g/dL   HCT 40.4  39.0 - 52.0 %   MCV 87.4  78.0 - 100.0 fL   MCH 30.3  26.0 - 34.0 pg   MCHC 34.7  30.0 - 36.0 g/dL   RDW 13.2  11.5 - 15.5 %   Platelets 228  150 - 400 K/uL   Neutrophils Relative % 79 (*) 43 - 77 %   Neutro Abs 8.2 (*) 1.7 - 7.7 K/uL   Lymphocytes Relative 14  12 - 46 %   Lymphs Abs 1.4  0.7 - 4.0 K/uL   Monocytes Relative 5  3 - 12 %   Monocytes Absolute 0.6  0.1 - 1.0 K/uL   Eosinophils Relative 2  0 - 5 %   Eosinophils Absolute 0.2  0.0 - 0.7 K/uL   Basophils Relative 0  0 - 1 %   Basophils Absolute 0.0  0.0 - 0.1 K/uL  ACETAMINOPHEN LEVEL     Status: None   Collection Time    09/10/14  7:13 PM  Result Value Ref Range   Acetaminophen (Tylenol), Serum <15.0  10 - 30 ug/mL   Comment:            THERAPEUTIC CONCENTRATIONS VARY     SIGNIFICANTLY. A RANGE OF 10-30     ug/mL MAY BE AN EFFECTIVE     CONCENTRATION FOR MANY PATIENTS.     HOWEVER, SOME ARE BEST TREATED     AT CONCENTRATIONS OUTSIDE THIS     RANGE.     ACETAMINOPHEN CONCENTRATIONS     >150 ug/mL AT 4 HOURS AFTER     INGESTION AND >50 ug/mL AT 12     HOURS AFTER INGESTION ARE     OFTEN ASSOCIATED WITH TOXIC     REACTIONS.  CBC     Status: None   Collection Time    09/10/14  7:13 PM      Result Value Ref Range   WBC 8.5  4.0 - 10.5 K/uL   RBC 4.54  4.22 - 5.81 MIL/uL   Hemoglobin 13.8  13.0 - 17.0 g/dL   HCT 40.7  39.0 - 52.0 %   MCV 89.6  78.0 - 100.0 fL   MCH 30.4  26.0 - 34.0 pg   MCHC 33.9  30.0 - 36.0 g/dL   RDW 14.1  11.5 - 15.5 %   Platelets 233  150 - 400 K/uL  COMPREHENSIVE METABOLIC PANEL     Status: Abnormal   Collection Time    09/10/14  7:13 PM      Result Value Ref Range   Sodium 135 (*) 137 - 147 mEq/L   Potassium 4.6  3.7 - 5.3 mEq/L   Chloride 99  96 - 112 mEq/L   CO2 24   19 - 32 mEq/L   Glucose, Bld 114 (*) 70 - 99 mg/dL   BUN 21  6 - 23 mg/dL   Creatinine, Ser 1.17  0.50 - 1.35 mg/dL   Calcium 9.0  8.4 - 10.5 mg/dL   Total Protein 6.3  6.0 - 8.3 g/dL   Albumin 4.0  3.5 - 5.2 g/dL   AST 15  0 - 37 U/L   ALT 10  0 - 53 U/L   Alkaline Phosphatase 104  39 - 117 U/L   Total Bilirubin 0.2 (*) 0.3 - 1.2 mg/dL   GFR calc non Af Amer 58 (*) >90 mL/min   GFR calc Af Amer 68 (*) >90 mL/min   Comment: (NOTE)     The eGFR has been calculated using the CKD EPI equation.     This calculation has not been validated in all clinical situations.     eGFR's persistently <90 mL/min signify possible Chronic Kidney     Disease.   Anion gap 12  5 - 15  ETHANOL     Status: None   Collection Time    09/10/14  7:13 PM      Result Value Ref Range   Alcohol, Ethyl (B) <11  0 - 11 mg/dL   Comment:            LOWEST DETECTABLE LIMIT FOR     SERUM ALCOHOL IS 11 mg/dL     FOR MEDICAL PURPOSES ONLY  SALICYLATE LEVEL     Status: Abnormal   Collection Time    09/10/14  7:13 PM      Result Value Ref Range   Salicylate Lvl <3.9 (*) 2.8 - 20.0 mg/dL  URINE RAPID DRUG SCREEN (HOSP PERFORMED)  Status: None   Collection Time    09/10/14  8:30 PM      Result Value Ref Range   Opiates NONE DETECTED  NONE DETECTED   Cocaine NONE DETECTED  NONE DETECTED   Benzodiazepines NONE DETECTED  NONE DETECTED   Amphetamines NONE DETECTED  NONE DETECTED   Tetrahydrocannabinol NONE DETECTED  NONE DETECTED   Barbiturates NONE DETECTED  NONE DETECTED   Comment:            DRUG SCREEN FOR MEDICAL PURPOSES     ONLY.  IF CONFIRMATION IS NEEDED     FOR ANY PURPOSE, NOTIFY LAB     WITHIN 5 DAYS.                LOWEST DETECTABLE LIMITS     FOR URINE DRUG SCREEN     Drug Class       Cutoff (ng/mL)     Amphetamine      1000     Barbiturate      200     Benzodiazepine   470     Tricyclics       929     Opiates          300     Cocaine          300     THC              50  URINALYSIS,  ROUTINE W REFLEX MICROSCOPIC     Status: Abnormal   Collection Time    09/22/14 12:54 AM      Result Value Ref Range   Color, Urine AMBER (*) YELLOW   Comment: BIOCHEMICALS MAY BE AFFECTED BY COLOR   APPearance CLEAR  CLEAR   Specific Gravity, Urine 1.016  1.005 - 1.030   pH 5.0  5.0 - 8.0   Glucose, UA NEGATIVE  NEGATIVE mg/dL   Hgb urine dipstick NEGATIVE  NEGATIVE   Bilirubin Urine NEGATIVE  NEGATIVE   Ketones, ur NEGATIVE  NEGATIVE mg/dL   Protein, ur NEGATIVE  NEGATIVE mg/dL   Urobilinogen, UA 0.2  0.0 - 1.0 mg/dL   Nitrite POSITIVE (*) NEGATIVE   Leukocytes, UA MODERATE (*) NEGATIVE  URINE MICROSCOPIC-ADD ON     Status: Abnormal   Collection Time    09/22/14 12:54 AM      Result Value Ref Range   Squamous Epithelial / LPF RARE  RARE   WBC, UA 7-10  <3 WBC/hpf   RBC / HPF 0-2  <3 RBC/hpf   Bacteria, UA FEW (*) RARE    Past Psychiatric History/Hospitalization(s): Patient has significant history of bipolar disorder.  He has multiple psychiatric admission due to decompensation and noncompliance of medication.  Patient has history of aggression and impulsive behavior.  His last psychiatric admission was in 2005. Anxiety: Yes Bipolar Disorder: Yes Depression: No Mania: No Psychosis: No Schizophrenia: No Personality Disorder: No Hospitalization for psychiatric illness: Yes History of Electroconvulsive Shock Therapy: No Prior Suicide Attempts: No  Physical Exam: Constitutional:  BP 118/69  Pulse 57  Ht 5' 10.5" (1.791 m)  Wt 160 lb 9.6 oz (72.848 kg)  BMI 22.71 kg/m2  General Appearance: Patient is pleasant but confused.  He is fairly groomed.  Musculoskeletal: Strength & Muscle Tone: decreased and atrophy Gait & Station: unsteady Patient leans: Front  Mental status examination Patient is casually dressed and fairly groomed. He maintained fair eye contact. He is superficially cooperative.  He continues to have difficulty remembering  things. He gets very anxious  and repeated multiple times the same question.  His speech is soft and non-spontaneous.   His thought process is slow but logical and goal directed.  He denies any active or passive suicidal thoughts or homicidal thoughts.  He denies any auditory or visual hallucination.  His attention and concentration is poor.  He described his mood is anxious and his affect is mood appropriate.  There were no delusions , paranoia or obsession present at this time.  He has fine tremors and has difficulty walking.  He need walker .  His gait is unsteady.  He's alert and oriented x3.  His insight judgment and impulse control is fair.  Established Problem, Stable/Improving (1), Review of Psycho-Social Stressors (1), Review or order clinical lab tests (1), Review of Last Therapy Session (1), Review of Medication Regimen & Side Effects (2) and Review of New Medication or Change in Dosage (2)  Assessment: Axis I: Bipolar disorder, cognitive disorder NOS  Axis II: Deferred  Axis III: See medical history  Axis IV: Moderate  Axis V: 50-55   Plan:  I reviewed the notes from the emergency room visits and his blood work results.   recommended to take Klonopin 0.5 mg half tablet in the afternoon and continue to take one tablet at bedtime to help anxiety and nervousness.  He is taking Seroquel 50 mg which was reduced from 100 mg during his recent emergency room visits.  He does not want to increase his Seroquel.  Continue Zoloft 200 mg daily , Lamictal 150 mg twice a day.  At this time he does not have any rash or itching.  Recommended to call us back if he has any question or any concern.  I will see him again in 2 months. Time spent 25 minutes.  More than 50% of the time spent in psychoeducation, counseling and coordination of care.  Discuss safety plan that anytime having active suicidal thoughts or homicidal thoughts then patient need to call 911 or go to the local emergency room.   Brandis Matsuura T.,  MD 10/01/2014

## 2014-10-02 ENCOUNTER — Ambulatory Visit: Payer: Medicare Other | Admitting: Neurology

## 2014-11-04 ENCOUNTER — Encounter: Payer: Self-pay | Admitting: Neurology

## 2014-11-10 ENCOUNTER — Encounter: Payer: Self-pay | Admitting: Neurology

## 2014-12-02 ENCOUNTER — Ambulatory Visit (INDEPENDENT_AMBULATORY_CARE_PROVIDER_SITE_OTHER): Payer: Medicare Other | Admitting: Psychiatry

## 2014-12-02 ENCOUNTER — Encounter (HOSPITAL_COMMUNITY): Payer: Self-pay | Admitting: Psychiatry

## 2014-12-02 VITALS — BP 116/79 | HR 55 | Ht 70.0 in | Wt 165.2 lb

## 2014-12-02 DIAGNOSIS — F319 Bipolar disorder, unspecified: Secondary | ICD-10-CM

## 2014-12-02 MED ORDER — CLONAZEPAM 0.5 MG PO TABS: ORAL_TABLET | ORAL | Status: DC

## 2014-12-02 MED ORDER — LAMOTRIGINE 150 MG PO TABS: 150.0000 mg | ORAL_TABLET | Freq: Two times a day (BID) | ORAL | Status: DC

## 2014-12-02 MED ORDER — TRAZODONE HCL 100 MG PO TABS: 100.0000 mg | ORAL_TABLET | Freq: Every day | ORAL | Status: DC

## 2014-12-02 MED ORDER — QUETIAPINE FUMARATE 50 MG PO TABS: 50.0000 mg | ORAL_TABLET | Freq: Every day | ORAL | Status: DC

## 2014-12-02 NOTE — Progress Notes (Signed)
[facility] [phone] Progress Note  [name] [id number] 79 y.o.  12/02/2014 11:15 AM  Chief Complaint:  I am feeling better.           History of Present Illness:  [name] came for his appointment with his aide.  He is feeling better on his current medication.  On last visit we had adjusted his medication.  He sleeping better and he is feeling less anxious and less depressed.  He denies any major panic attack or any crying spells.  His aide mentioned that he sleeping very good and there has been no recent paranoia, agitation or any angry episode.  He is taking Lamictal, trazodone, Zoloft, Klonopin and Seroquel.  He has no rash or itching.  His appetite is okay.  His vitals are stable.  Patient is also very happy that he has no recent UTI .  He had a quite Thanksgiving and he was not happy with the food but he is hoping to better Christmas.  He is more social and active.  He does worried about his health issues but there has been no recent crying spells.  Patient denies any hallucination or any suicidal thoughts.  [place].  His vitals are stable.  Patient denies drinking or using any illegal substances.  He is scheduled to see his neurologist Dr. Isaac Bliss on February 4.  Suicidal Ideation: No Plan Formed: No Patient has means to carry out plan: No  Homicidal Ideation: No Plan Formed: No Patient has means to carry out plan: No  Review of Systems  HENT: Positive for hearing loss.   Skin: Negative.  Negative for itching and rash.  Neurological: Positive for tremors.       Memory impairment, unsteady gait.  Psychiatric/Behavioral: The patient is nervous/anxious.     Medical history Patient has history of diabetes mellitus, hypertension, Parkinson disease, gait instability, hearing and memory impairment. He has multiple UTIs.  He see Dr. Floyde Parkins.    Outpatient Encounter Prescriptions as of 12/02/2014  Medication Sig  . acetaminophen (TYLENOL) 325 MG  tablet Take 650 mg by mouth every 6 (six) hours as needed for mild pain.  Marland Kitchen albuterol (PROVENTIL) (2.5 MG/3ML) 0.083% nebulizer solution Take 3 mLs (2.5 mg total) by nebulization every 2 (two) hours as needed for wheezing or shortness of breath.  Marland Kitchen amLODipine (NORVASC) 5 MG tablet Take 1 tablet (5 mg total) by mouth daily.  Marland Kitchen aspirin 81 MG chewable tablet Chew 81 mg by mouth daily.  . carbidopa-levodopa (SINEMET IR) 25-250 MG per tablet Take 1 tablet by mouth 3 (three) times daily.  . clonazePAM (KLONOPIN) 0.5 MG tablet Take 1/2 tab at noon and 1 tab at bed time  . entacapone (COMTAN) 200 MG tablet Take 200 mg by mouth 3 (three) times daily.  Marland Kitchen GLUCERNA (GLUCERNA) LIQD Take 237 mLs by mouth daily.   Marland Kitchen guaiFENesin (MUCINEX) 600 MG 12 hr tablet Take 1 tablet (600 mg total) by mouth 2 (two) times daily.  . hydrALAZINE (APRESOLINE) 25 MG tablet Take 25 mg by mouth 3 (three) times daily.  Marland Kitchen ipratropium-albuterol (DUONEB) 0.5-2.5 (3) MG/3ML SOLN Take 3 mLs by nebulization 3 (three) times daily.  Marland Kitchen lamoTRIgine (LAMICTAL) 150 MG tablet Take 1 tablet (150 mg total) by mouth 2 (two) times daily.  Marland Kitchen lisinopril (PRINIVIL,ZESTRIL) 10 MG tablet Take 1 tablet (10 mg total) by mouth daily.  . mineral oil-hydrophilic petrolatum (AQUAPHOR) ointment Apply 1 application topically 2 (two) times daily  as needed for dry skin. For dry skin on heels  . Multiple Vitamin (MULTIVITAMIN WITH MINERALS) TABS tablet Take 1 tablet by mouth daily.  . pantoprazole (PROTONIX) 40 MG tablet Take 1 tablet (40 mg total) by mouth daily at 12 noon.  . polyethylene glycol (MIRALAX / GLYCOLAX) packet Take 17 g by mouth daily.  . QUEtiapine (SEROQUEL) 50 MG tablet Take 1 tablet (50 mg total) by mouth at bedtime.  . Red Yeast Rice Extract (RED YEAST RICE PO) Take 2 tablets by mouth at bedtime.   . sertraline (ZOLOFT) 100 MG tablet Take 2 tablets (200 mg total) by mouth daily.  . traZODone (DESYREL) 100 MG tablet Take 1 tablet (100 mg total)  by mouth at bedtime.  Cristino Martes Root 500 MG CAPS Take 1 capsule by mouth at bedtime.  . [DISCONTINUED] clonazePAM (KLONOPIN) 0.5 MG tablet Take 1/2 tab at noon and 1 tab at bed time  . [DISCONTINUED] lamoTRIgine (LAMICTAL) 150 MG tablet Take 1 tablet (150 mg total) by mouth 2 (two) times daily.  . [DISCONTINUED] QUEtiapine (SEROQUEL) 50 MG tablet Take 1 tablet (50 mg total) by mouth at bedtime.  . [DISCONTINUED] traZODone (DESYREL) 100 MG tablet Take 1 tablet (100 mg total) by mouth at bedtime.   Recent Results (from the past 2160 hour(s))  Acetaminophen level     Status: None   Collection Time: 09/10/14  7:13 PM  Result Value Ref Range   Acetaminophen (Tylenol), Serum <15.0 10 - 30 ug/mL    Comment:        THERAPEUTIC CONCENTRATIONS VARY SIGNIFICANTLY. A RANGE OF 10-30 ug/mL MAY BE AN EFFECTIVE CONCENTRATION FOR MANY PATIENTS. HOWEVER, SOME ARE BEST TREATED AT CONCENTRATIONS OUTSIDE THIS RANGE. ACETAMINOPHEN CONCENTRATIONS >150 ug/mL AT 4 HOURS AFTER INGESTION AND >50 ug/mL AT 12 HOURS AFTER INGESTION ARE OFTEN ASSOCIATED WITH TOXIC REACTIONS.  CBC     Status: None   Collection Time: 09/10/14  7:13 PM  Result Value Ref Range   WBC 8.5 4.0 - 10.5 K/uL   RBC 4.54 4.22 - 5.81 MIL/uL   Hemoglobin 13.8 13.0 - 17.0 g/dL   HCT 40.7 39.0 - 52.0 %   MCV 89.6 78.0 - 100.0 fL   MCH 30.4 26.0 - 34.0 pg   MCHC 33.9 30.0 - 36.0 g/dL   RDW 14.1 11.5 - 15.5 %   Platelets 233 150 - 400 K/uL  Comprehensive metabolic panel     Status: Abnormal   Collection Time: 09/10/14  7:13 PM  Result Value Ref Range   Sodium 135 (L) 137 - 147 mEq/L   Potassium 4.6 3.7 - 5.3 mEq/L   Chloride 99 96 - 112 mEq/L   CO2 24 19 - 32 mEq/L   Glucose, Bld 114 (H) 70 - 99 mg/dL   BUN 21 6 - 23 mg/dL   Creatinine, Ser 1.17 0.50 - 1.35 mg/dL   Calcium 9.0 8.4 - 10.5 mg/dL   Total Protein 6.3 6.0 - 8.3 g/dL   Albumin 4.0 3.5 - 5.2 g/dL   AST 15 0 - 37 U/L   ALT 10 0 - 53 U/L   Alkaline Phosphatase 104 39 -  117 U/L   Total Bilirubin 0.2 (L) 0.3 - 1.2 mg/dL   GFR calc non Af Amer 58 (L) >90 mL/min   GFR calc Af Amer 68 (L) >90 mL/min    Comment: (NOTE) The eGFR has been calculated using the CKD EPI equation. This calculation has not been validated in all clinical  situations. eGFR's persistently <90 mL/min signify possible Chronic Kidney Disease.   Anion gap 12 5 - 15  Ethanol (ETOH)     Status: None   Collection Time: 09/10/14  7:13 PM  Result Value Ref Range   Alcohol, Ethyl (B) <11 0 - 11 mg/dL    Comment:        LOWEST DETECTABLE LIMIT FOR SERUM ALCOHOL IS 11 mg/dL FOR MEDICAL PURPOSES ONLY  Salicylate level     Status: Abnormal   Collection Time: 09/10/14  7:13 PM  Result Value Ref Range   Salicylate Lvl <5.4 (L) 2.8 - 20.0 mg/dL  Urine Drug Screen     Status: None   Collection Time: 09/10/14  8:30 PM  Result Value Ref Range   Opiates NONE DETECTED NONE DETECTED   Cocaine NONE DETECTED NONE DETECTED   Benzodiazepines NONE DETECTED NONE DETECTED   Amphetamines NONE DETECTED NONE DETECTED   Tetrahydrocannabinol NONE DETECTED NONE DETECTED   Barbiturates NONE DETECTED NONE DETECTED    Comment:        DRUG SCREEN FOR MEDICAL PURPOSES ONLY.  IF CONFIRMATION IS NEEDED FOR ANY PURPOSE, NOTIFY LAB WITHIN 5 DAYS.        LOWEST DETECTABLE LIMITS FOR URINE DRUG SCREEN Drug Class       Cutoff (ng/mL) Amphetamine      1000 Barbiturate      200 Benzodiazepine   008 Tricyclics       676 Opiates          300 Cocaine          300 THC              50  Urinalysis, Routine w reflex microscopic     Status: Abnormal   Collection Time: 09/22/14 12:54 AM  Result Value Ref Range   Color, Urine AMBER (A) YELLOW    Comment: BIOCHEMICALS MAY BE AFFECTED BY COLOR   APPearance CLEAR CLEAR   Specific Gravity, Urine 1.016 1.005 - 1.030   pH 5.0 5.0 - 8.0   Glucose, UA NEGATIVE NEGATIVE mg/dL   Hgb urine dipstick NEGATIVE NEGATIVE   Bilirubin Urine NEGATIVE NEGATIVE   Ketones, ur NEGATIVE  NEGATIVE mg/dL   Protein, ur NEGATIVE NEGATIVE mg/dL   Urobilinogen, UA 0.2 0.0 - 1.0 mg/dL   Nitrite POSITIVE (A) NEGATIVE   Leukocytes, UA MODERATE (A) NEGATIVE  Urine microscopic-add on     Status: Abnormal   Collection Time: 09/22/14 12:54 AM  Result Value Ref Range   Squamous Epithelial / LPF RARE RARE   WBC, UA 7-10 <3 WBC/hpf   RBC / HPF 0-2 <3 RBC/hpf   Bacteria, UA FEW (A) RARE    Past Psychiatric History/Hospitalization(s): Patient has significant history of bipolar disorder.  He has multiple psychiatric admission due to decompensation and noncompliance of medication.  Patient has history of aggression and impulsive behavior.  His last psychiatric admission was in 2005. Anxiety: Yes Bipolar Disorder: Yes Depression: No Mania: No Psychosis: No Schizophrenia: No Personality Disorder: No Hospitalization for psychiatric illness: Yes History of Electroconvulsive Shock Therapy: No Prior Suicide Attempts: No  Physical Exam: Constitutional:  BP 116/79 mmHg  Pulse 55  Ht 5' 10" (1.778 m)  Wt 165 lb 3.2 oz (74.934 kg)  BMI 23.70 kg/m2  General Appearance: Patient is pleasant but confused.  He is fairly groomed.  Musculoskeletal: Strength & Muscle Tone: decreased and atrophy Gait & Station: unsteady Patient leans: Front  Mental status examination Patient is casually dressed and  fairly groomed. He maintained fair eye contact.  He is pleasant and cooperative.  He described his mood good today and his affect is improved.  He has difficulty remembering things and sometime he asks questions multiple times.  His speech is soft and non-spontaneous.   His thought process is slow but logical and goal directed.  He denies any active or passive suicidal thoughts or homicidal thoughts.  He denies any auditory or visual hallucination.  His attention and concentration is distracted.  There were no delusions , paranoia or obsession present at this time.  He has fine tremors and has  difficulty walking.  He need walker .  His gait is unsteady.  He's alert and oriented x3.  His insight judgment and impulse control is fair.  Established Problem, Stable/Improving (1), Review of Psycho-Social Stressors (1), Review or order clinical lab tests (1), Review and summation of old records (2), Review of Last Therapy Session (1) and Review of Medication Regimen & Side Effects (2)  Assessment: Axis I: Bipolar disorder, cognitive disorder NOS  Axis II: Deferred  Axis III: See medical history  Axis IV: Moderate  Axis V: 50-55   Plan:  Patient is doing better on his current medication.  He has no rash or itching with the Lamictal.  I will continue Klonopin 0.5 mg half tablet in the morning and 1 tablet at bedtime.  Continue Seroquel 50 mg at bedtime, trazodone 100 mg at bedtime and Zoloft 200 mg daily.  Patient is scheduled to see Dr. Silva Bandy on Fabry 4.  I recommended to call us back if he has any question, concern or if he feel worsening of the symptom.  I will see him in 3 months.  Time spent 25 minutes.  More than 50% of the time spent in psychoeducation, counseling and coordination of care.   , T., MD 12/02/2014

## 2015-01-21 ENCOUNTER — Telehealth: Payer: Self-pay | Admitting: Neurology

## 2015-01-21 ENCOUNTER — Ambulatory Visit: Payer: Medicare Other | Admitting: Neurology

## 2015-01-21 NOTE — Telephone Encounter (Signed)
This patient did not show for a revisit appointment today. 

## 2015-01-22 ENCOUNTER — Encounter: Payer: Self-pay | Admitting: Neurology

## 2015-03-03 ENCOUNTER — Ambulatory Visit (HOSPITAL_COMMUNITY): Payer: Self-pay | Admitting: Psychiatry

## 2015-03-12 ENCOUNTER — Encounter (HOSPITAL_COMMUNITY): Payer: Self-pay | Admitting: Emergency Medicine

## 2015-03-12 ENCOUNTER — Emergency Department (HOSPITAL_COMMUNITY): Payer: Medicare Other

## 2015-03-12 ENCOUNTER — Emergency Department (HOSPITAL_COMMUNITY)
Admission: EM | Admit: 2015-03-12 | Discharge: 2015-03-12 | Disposition: A | Discharge status: Home / Self Care | Payer: Medicare Other | Attending: Emergency Medicine | Admitting: Emergency Medicine

## 2015-03-12 DIAGNOSIS — Z8619 Personal history of other infectious and parasitic diseases: Secondary | ICD-10-CM | POA: Insufficient documentation

## 2015-03-12 DIAGNOSIS — I10 Essential (primary) hypertension: Secondary | ICD-10-CM | POA: Diagnosis not present

## 2015-03-12 DIAGNOSIS — Z7951 Long term (current) use of inhaled steroids: Secondary | ICD-10-CM | POA: Insufficient documentation

## 2015-03-12 DIAGNOSIS — N390 Urinary tract infection, site not specified: Secondary | ICD-10-CM | POA: Diagnosis not present

## 2015-03-12 DIAGNOSIS — F319 Bipolar disorder, unspecified: Secondary | ICD-10-CM | POA: Insufficient documentation

## 2015-03-12 DIAGNOSIS — E119 Type 2 diabetes mellitus without complications: Secondary | ICD-10-CM | POA: Insufficient documentation

## 2015-03-12 DIAGNOSIS — G20 Parkinson's disease: Secondary | ICD-10-CM | POA: Diagnosis not present

## 2015-03-12 DIAGNOSIS — F419 Anxiety disorder, unspecified: Secondary | ICD-10-CM | POA: Insufficient documentation

## 2015-03-12 DIAGNOSIS — Z8701 Personal history of pneumonia (recurrent): Secondary | ICD-10-CM | POA: Insufficient documentation

## 2015-03-12 DIAGNOSIS — M1990 Unspecified osteoarthritis, unspecified site: Secondary | ICD-10-CM | POA: Diagnosis not present

## 2015-03-12 DIAGNOSIS — Z466 Encounter for fitting and adjustment of urinary device: Secondary | ICD-10-CM | POA: Insufficient documentation

## 2015-03-12 DIAGNOSIS — Z8781 Personal history of (healed) traumatic fracture: Secondary | ICD-10-CM | POA: Diagnosis not present

## 2015-03-12 DIAGNOSIS — Z7982 Long term (current) use of aspirin: Secondary | ICD-10-CM | POA: Insufficient documentation

## 2015-03-12 DIAGNOSIS — Z79899 Other long term (current) drug therapy: Secondary | ICD-10-CM | POA: Insufficient documentation

## 2015-03-12 DIAGNOSIS — R509 Fever, unspecified: Secondary | ICD-10-CM | POA: Diagnosis not present

## 2015-03-12 DIAGNOSIS — N400 Enlarged prostate without lower urinary tract symptoms: Secondary | ICD-10-CM | POA: Insufficient documentation

## 2015-03-12 LAB — URINALYSIS, ROUTINE W REFLEX MICROSCOPIC
Glucose, UA: NEGATIVE mg/dL
Ketones, ur: NEGATIVE mg/dL
Nitrite: POSITIVE — AB
Protein, ur: 100 mg/dL — AB
Specific Gravity, Urine: 1.02 (ref 1.005–1.030)
Urobilinogen, UA: 1 mg/dL (ref 0.0–1.0)
pH: 5.5 (ref 5.0–8.0)

## 2015-03-12 LAB — URINE MICROSCOPIC-ADD ON

## 2015-03-12 MED ORDER — NITROFURANTOIN MONOHYD MACRO 100 MG PO CAPS: 100.0000 mg | ORAL_CAPSULE | Freq: Two times a day (BID) | ORAL | Status: DC

## 2015-03-12 MED ORDER — SULFAMETHOXAZOLE-TRIMETHOPRIM 800-160 MG PO TABS: 1.0000 | ORAL_TABLET | Freq: Two times a day (BID) | ORAL | Status: DC

## 2015-03-12 MED ORDER — SULFAMETHOXAZOLE-TRIMETHOPRIM 800-160 MG PO TABS
1.0000 | ORAL_TABLET | Freq: Once | ORAL | Status: AC
  Administered 2015-03-12: 1 via ORAL
  Filled 2015-03-12: qty 1

## 2015-03-12 MED ORDER — ACETAMINOPHEN 325 MG PO TABS
650.0000 mg | ORAL_TABLET | Freq: Once | ORAL | Status: AC
  Administered 2015-03-12: 650 mg via ORAL
  Filled 2015-03-12: qty 2

## 2015-03-12 NOTE — ED Provider Notes (Signed)
CSN: [id number]     Arrival date & time 03/12/15  5284 History   First MD Initiated Contact with Patient 03/12/15 [phone]     Chief Complaint  Patient presents with  . Pulled out Catheter     The history is provided by the patient. No language interpreter was used.   [name] evaluation following pulling out his Foley catheter. He was lying on the couch and rolled over after dropping the remaining and rolled off the couch and pulled out his foley catheter. This happened immediately prior to ED arrival. He has slight stinging sensation in his penis. He did have some bleeding from the penis, now resolved. He denies any recent illness. He denies any head injury. Denies fevers, vomiting, abdominal pain, diarrhea. He has a chronic indwelling Foley gets it changed mostly he is not sure when the last time was changed. He takes a baby aspirin daily. He has a history of Parkinson's. Symptoms are moderate and constant. [facility] [place] [facility]  Past Medical History  Diagnosis Date  . Hearing aid worn   . Hypertension   . Parkinson disease   . Pneumonia   . Diabetes mellitus type II   . Arthritis   . Bipolar disorder   . Anxiety   . Depression   . MRSA infection (methicillin-resistant Staphylococcus aureus)   . Chronic indwelling Foley catheter   . C2 cervical fracture     due to pt fall  . SIRS (systemic inflammatory response syndrome)   . Neuromuscular disorder   . Memory loss   . Benign prostate hyperplasia   . Memory difficulty 08/21/2014   Past Surgical History  Procedure Laterality Date  . Total hip arthroplasty    . Video bronchoscopy  12/16/2012    Procedure: VIDEO BRONCHOSCOPY;  Surgeon: Melrose Nakayama, MD;  Location: [facility];  Service: Thoracic;  Laterality: Right;  . Video assisted thoracoscopy (vats)/empyema  12/16/2012    Procedure: VIDEO ASSISTED THORACOSCOPY (VATS)/EMPYEMA;  Surgeon: Melrose Nakayama, MD;  Location: [facility];  Service: Thoracic;  Laterality:  Right;   Family History  Problem Relation Age of Onset  . Depression Father   . Cancer Mother   . Heart disease Mother    History  Substance Use Topics  . Smoking status: Never Smoker   . Smokeless tobacco: Never Used  . Alcohol Use: No     Comment: occasional    Review of Systems  All other systems reviewed and are negative.     Allergies  Cholestatin  Home Medications   Prior to Admission medications   Medication Sig Start Date End Date Taking? Authorizing Provider  acetaminophen (TYLENOL) 325 MG tablet Take 650 mg by mouth every 6 (six) hours as needed for mild pain.   Yes Historical Provider, MD  albuterol (PROVENTIL) (2.5 MG/3ML) 0.083% nebulizer solution Take 3 mLs (2.5 mg total) by nebulization every 2 (two) hours as needed for wheezing or shortness of breath. 04/21/14  Yes Hosie Poisson, MD  amLODipine (NORVASC) 5 MG tablet Take 1 tablet (5 mg total) by mouth daily. 02/03/13  Yes Janece Canterbury, MD  aspirin 81 MG chewable tablet Chew 81 mg by mouth daily.   Yes Historical Provider, MD  bisacodyl (DULCOLAX) 10 MG suppository Place 10 mg rectally daily as needed for moderate constipation.   Yes Historical Provider, MD  carbidopa-levodopa (SINEMET IR) 25-250 MG per tablet Take 1 tablet by mouth 3 (three) times daily.   Yes Historical  Provider, MD  clonazePAM (KLONOPIN) 0.5 MG tablet Take 1/2 tab at noon and 1 tab at bed time 12/02/14  Yes Kathlee Nations, MD  entacapone (COMTAN) 200 MG tablet Take 200 mg by mouth 3 (three) times daily.   Yes Historical Provider, MD  fluticasone (FLONASE) 50 MCG/ACT nasal spray Place 2 sprays into both nostrils daily.   Yes Historical Provider, MD  GLUCERNA (GLUCERNA) LIQD Take 237 mLs by mouth daily.    Yes Historical Provider, MD  guaiFENesin (MUCINEX) 600 MG 12 hr tablet Take 1 tablet (600 mg total) by mouth 2 (two) times daily. 04/21/14  Yes Hosie Poisson, MD  hydrALAZINE (APRESOLINE) 25 MG tablet Take 25 mg by mouth 3 (three) times daily.    Yes Historical Provider, MD  ipratropium-albuterol (DUONEB) 0.5-2.5 (3) MG/3ML SOLN Take 3 mLs by nebulization 3 (three) times daily. 04/21/14  Yes Hosie Poisson, MD  lamoTRIgine (LAMICTAL) 150 MG tablet Take 1 tablet (150 mg total) by mouth 2 (two) times daily. 12/02/14  Yes Kathlee Nations, MD  lisinopril (PRINIVIL,ZESTRIL) 10 MG tablet Take 1 tablet (10 mg total) by mouth daily. 02/03/13  Yes Janece Canterbury, MD  loratadine (CLARITIN) 10 MG tablet Take 10 mg by mouth daily as needed for allergies.   Yes Historical Provider, MD  mineral oil-hydrophilic petrolatum (AQUAPHOR) ointment Apply 1 application topically 2 (two) times daily as needed for dry skin. For dry skin on heels   Yes Historical Provider, MD  Multiple Vitamin (MULTIVITAMIN WITH MINERALS) TABS tablet Take 1 tablet by mouth daily.   Yes Historical Provider, MD  pantoprazole (PROTONIX) 40 MG tablet Take 1 tablet (40 mg total) by mouth daily at 12 noon. 04/21/14  Yes Hosie Poisson, MD  polyethylene glycol (MIRALAX / GLYCOLAX) packet Take 17 g by mouth daily.   Yes Historical Provider, MD  QUEtiapine (SEROQUEL) 50 MG tablet Take 1 tablet (50 mg total) by mouth at bedtime. 12/02/14  Yes Kathlee Nations, MD  Red Yeast Rice Extract (RED YEAST RICE PO) Take 2 tablets by mouth at bedtime.    Yes Historical Provider, MD  sertraline (ZOLOFT) 100 MG tablet Take 2 tablets (200 mg total) by mouth daily. 10/01/14  Yes Kathlee Nations, MD  traZODone (DESYREL) 100 MG tablet Take 1 tablet (100 mg total) by mouth at bedtime. 12/02/14  Yes Kathlee Nations, MD   BP 154/76 mmHg  Pulse 108  Temp(Src) 100 F (37.8 C) (Oral)  Resp 18  SpO2 96% Physical Exam  Constitutional: He is oriented to person, place, and time. He appears well-developed and well-nourished.  HENT:  Head: Normocephalic and atraumatic.  Cardiovascular: Normal rate and regular rhythm.   No murmur heard. Pulmonary/Chest: Effort normal and breath sounds normal. No respiratory distress.  Abdominal:  Soft. There is no tenderness. There is no rebound and no guarding.  Genitourinary: No penile tenderness.  No blood at the urethral meatus  Musculoskeletal: He exhibits no edema or tenderness.  Neurological: He is alert and oriented to person, place, and time.  Skin: Skin is warm and dry.  Psychiatric: He has a normal mood and affect. His behavior is normal.  Nursing note and vitals reviewed.   ED Course  Procedures (including critical care time) Labs Review Labs Reviewed  URINALYSIS, ROUTINE W REFLEX MICROSCOPIC - Abnormal; Notable for the following:    Color, Urine RED (*)    APPearance TURBID (*)    Hgb urine dipstick LARGE (*)    Bilirubin Urine SMALL (*)  Protein, ur 100 (*)    Nitrite POSITIVE (*)    Leukocytes, UA LARGE (*)    All other components within normal limits  URINE MICROSCOPIC-ADD ON - Abnormal; Notable for the following:    Bacteria, UA MANY (*)    All other components within normal limits  URINE CULTURE    Imaging Review Dg Chest 2 View  03/12/2015   CLINICAL DATA:  Fever, congestion, hypertension, type to diabetes, Parkinson's  EXAM: CHEST  2 VIEW  COMPARISON:  09/21/2014 ; correlation CT chest 04/18/2014  FINDINGS: Enlargement of cardiac silhouette.  Calcification and elongation of thoracic aorta.  Mediastinal contours and pulmonary vascularity normal.  Minimal elevation RIGHT diaphragm.  Persistent nodular density at minor fissure 20 x 12 mm corresponding to chronic atelectasis, loculated pleural fluid or pleural thickening by prior CT.  No acute infiltrate, pleural effusion or pneumothorax.  Multiple old LEFT rib fractures.  Advanced BILATERAL glenohumeral degenerative changes.  IMPRESSION: Enlargement of cardiac silhouette.  Chronic nodular opacity at the minor fissure unchanged since May 2015.  No acute abnormalities.   Electronically Signed   By: Lavonia Dana M.D.   On: 03/12/2015 08:40     EKG Interpretation None      MDM   Final diagnoses:   Febrile illness, acute  Encounter for Foley catheter replacement  Acute UTI    Patient presents to the emergency department for Foley catheter replacement. Catheter was replaced in the department without difficulty and was draining. Upon discharge patient did develop a fever to 101. Discussed with patient recommendations to draw lab work provide IV fluids and further evaluation for fever. Patient refuses any IV or lab work but he does agree to UA and chest x-ray. UA is consistent with urinary tract infection. Will treat with macrobid after reviewing prior sensitivities. Chest x-ray and exam is not consistent with pneumonia. Discussed with patient that he does have a fever and urinary tract infection and this does need further evaluation by his family doctor.  He continues to refuse IV/lab work/fluids in the emergency department. Discussed very close home care and return precautions.    Quintella Reichert, MD 03/12/15 [phone]

## 2015-03-12 NOTE — ED Notes (Signed)
Patient denies pain or injuries associated with fall, states he would like his catheter replaced and to be discharged.

## 2015-03-12 NOTE — ED Notes (Signed)
Bed: WA17 Expected date: 03/12/15 Expected time: 6:01 AM Means of arrival: Ambulance Comments: 80 yo M  Pulled urinary catheter out

## 2015-03-12 NOTE — Discharge Instructions (Signed)
Please get rechecked by your family doctor in the next day for your fever and urinary tract infection.  Get rechecked immediately if you develop any new or worrisome symptoms .    Catheter-Associated Urinary Tract Infection FAQs WHAT IS "CATHETER-ASSOCIATED" URINARY TRACT INFECTION? A urinary tract infection (also called "UTI") is an infection in the urinary system, which includes the bladder (which stores the urine) and the kidneys (which filter the blood to make urine). Germs (for example, bacteria or yeasts) do not normally live in these areas; but if germs are introduced, an infection can occur. If you have a urinary catheter, germs can travel along the catheter and cause an infection in your bladder or your kidney; in that case it is called a catheter-associated urinary tract infection (or "CA-UTI").  WHAT IS A URINARY CATHETER? A urinary catheter is a thin tube placed in the bladder to drain urine. Urine drains through the tube into a bag that collects the urine. A urinary  catheter may be used:  If you are not able to urinate on your own.  To measure the amount of urine that you make, for example, during intensive care.  During and after some types of surgery.  During some tests of the kidneys and bladder . People with urinary catheters have a much higher chance of getting a urinary tract infection than people who don't have a catheter. HOW DO I GET A CATHETER-ASSOCIATED URINARY TRACT INFECTION (CA-UTI)? If germs enter the urinary tract, they may cause an infection. Many of the germs that cause a catheter-associated urinary tract infection are common germs found in your intestines that do not usually cause an infection there. Germs can enter the urinary tract when the catheter is being put in or while the catheter remains in the bladder.  WHAT ARE THE SYMPTOMS OF A URINARY TRACT INFECTION?  Some of the common symptoms of a urinary tract infection are:  Burning or pain in the lower  abdomen (that is, below the stomach).  Fever.  Bloody urine may be a sign of infection, but is also caused by other problems .  Burning during urination or an increase in the frequency of urination after the catheter is removed. Sometimes people with catheter-associated urinary tract infections do not have these symptoms of infection. CAN CATHETER-ASSOCIATED URINARY TRACT INFECTIONS BE TREATED? Yes, most catheter-associated urinary tract infections can be treated with antibiotics and removal or change of the catheter. Your doctor will determine which antibiotic is best for you.  WHAT ARE SOME OF THE THINGS THAT HOSPITALS ARE DOING TO PREVENT CATHETER-ASSOCIATED URINARY TRACT INFECTIONS? To prevent urinary tract infections, doctors and nurses take the following actions.  Catheter insertion  Catheters are put in only when necessary and they are removed as soon as possible.  Only properly trained persons insert catheters using sterile ("clean") technique.  The skin in the area where the catheter will be inserted is cleaned before inserting the catheter.  Other methods to drain the urine are sometimes used, such as:  External catheters in men (these look like condoms and are placed over the penis rather than into the penis)  Putting a temporary catheter in to drain the urine and removing it right away. This is called intermittent urethral catheterization. Catheter care  Healthcare providers clean their hands by washing them with soap and water or using an alcohol-based hand rub before and after touching your catheter.  If you do not see your providers clean their hands, please ask them to  do so.  Avoid disconnecting the catheter and drain tube. This helps to prevent germs from getting into the catheter tube.  The catheter is secured to the leg to prevent pulling on the catheter.  Avoid twisting or kinking the catheter.  Keep the bag lower than the bladder to prevent urine from  backflowing to the bladder.  Empty the bag regularly. The drainage spout should not touch anything while emptying the bag. WHAT CAN I DO TO HELP PREVENT CATHETER-ASSOCIATED URINARY TRACT INFECTIONS IF I HAVE A CATHETER?  Always clean your hands before and after doing catheter care.  Always keep your urine bag below the level of your bladder.  Do not tug or pull on the tubing.  Do not twist or kink the catheter tubing.  Ask your healthcare provider each day if you still need the catheter. WHAT DO I NEED TO DO WHEN I [place]?  If you will be going home with a catheter, your doctor or nurse should explain everything you need to know about taking care of the catheter. Make sure you understand how to care for it before you leave the hospital.  If you develop any of the symptoms of a urinary tract infection, such as burning or pain in the lower abdomen, fever, or an increase in the frequency of urination, contact your doctor or nurse immediately.  Before you go home, make sure you know who to contact if you have questions or problems after you get home. If you have questions, please ask your doctor or nurse. Developed and co-sponsored by [employer] for [place] [phone]); Infectious Diseases Society of [place] (IDSA); [employer] [place]; Association for Professionals in Infection Control and Epidemiology (APIC); Center for Disease Control (CDC); and The Joint Commission Document Released: 08/28/2012 Document Reviewed: 08/28/2012 [facility] Patient Information 2015 [place]. This information is not intended to replace advice given to you by your health care provider. Make sure you discuss any questions you have with your health care provider.

## 2015-03-12 NOTE — ED Notes (Signed)
Per EMS, patient from [facility]. Patient accidentally pulled out his foley catheter this am. Patient with bleeding from penis. Patient denies pain. Patient was laying on couch, reached to pick something off the floor and fell off the couch pulling out his foley. Patient alert at baseline.

## 2015-03-12 NOTE — ED Notes (Signed)
Patient transported to X-ray 

## 2015-03-14 ENCOUNTER — Encounter (HOSPITAL_COMMUNITY): Payer: Self-pay | Admitting: Emergency Medicine

## 2015-03-14 ENCOUNTER — Emergency Department (HOSPITAL_COMMUNITY)
Admission: EM | Admit: 2015-03-14 | Discharge: 2015-03-15 | Disposition: A | Discharge status: Home / Self Care | Payer: Medicare Other | Source: Home / Self Care | Attending: Emergency Medicine | Admitting: Emergency Medicine

## 2015-03-14 DIAGNOSIS — Z8614 Personal history of Methicillin resistant Staphylococcus aureus infection: Secondary | ICD-10-CM | POA: Insufficient documentation

## 2015-03-14 DIAGNOSIS — Z79899 Other long term (current) drug therapy: Secondary | ICD-10-CM | POA: Insufficient documentation

## 2015-03-14 DIAGNOSIS — Y9289 Other specified places as the place of occurrence of the external cause: Secondary | ICD-10-CM

## 2015-03-14 DIAGNOSIS — Z8701 Personal history of pneumonia (recurrent): Secondary | ICD-10-CM | POA: Insufficient documentation

## 2015-03-14 DIAGNOSIS — G20 Parkinson's disease: Secondary | ICD-10-CM

## 2015-03-14 DIAGNOSIS — Y998 Other external cause status: Secondary | ICD-10-CM

## 2015-03-14 DIAGNOSIS — Z7952 Long term (current) use of systemic steroids: Secondary | ICD-10-CM

## 2015-03-14 DIAGNOSIS — I10 Essential (primary) hypertension: Secondary | ICD-10-CM | POA: Insufficient documentation

## 2015-03-14 DIAGNOSIS — F319 Bipolar disorder, unspecified: Secondary | ICD-10-CM

## 2015-03-14 DIAGNOSIS — W1839XA Other fall on same level, initial encounter: Secondary | ICD-10-CM

## 2015-03-14 DIAGNOSIS — E119 Type 2 diabetes mellitus without complications: Secondary | ICD-10-CM | POA: Insufficient documentation

## 2015-03-14 DIAGNOSIS — Z043 Encounter for examination and observation following other accident: Secondary | ICD-10-CM | POA: Insufficient documentation

## 2015-03-14 DIAGNOSIS — M1990 Unspecified osteoarthritis, unspecified site: Secondary | ICD-10-CM

## 2015-03-14 DIAGNOSIS — F419 Anxiety disorder, unspecified: Secondary | ICD-10-CM | POA: Insufficient documentation

## 2015-03-14 DIAGNOSIS — W19XXXA Unspecified fall, initial encounter: Secondary | ICD-10-CM

## 2015-03-14 DIAGNOSIS — Y9389 Activity, other specified: Secondary | ICD-10-CM | POA: Insufficient documentation

## 2015-03-14 DIAGNOSIS — R509 Fever, unspecified: Secondary | ICD-10-CM | POA: Diagnosis not present

## 2015-03-14 DIAGNOSIS — J101 Influenza due to other identified influenza virus with other respiratory manifestations: Secondary | ICD-10-CM | POA: Diagnosis not present

## 2015-03-14 DIAGNOSIS — Z7982 Long term (current) use of aspirin: Secondary | ICD-10-CM

## 2015-03-14 NOTE — ED Notes (Signed)
Pt brought to ED by [facility] from [facility] after having unwitnessed fall, pt found sitting on the floor by staff. Pt denies hitting his head or LOC, but he didn't remember having a fall today, pt had fall x 4 this week. Pt has a hx of Parkinson dz.

## 2015-03-15 ENCOUNTER — Emergency Department (HOSPITAL_COMMUNITY): Payer: Medicare Other

## 2015-03-15 LAB — URINE CULTURE: Colony Count: >=100000

## 2015-03-15 NOTE — Discharge Instructions (Signed)
We saw you in the ER after you had a fall. All the imaging results are normal, no fractures seen. No evidence of brain bleed. Please be very careful with walking, and do everything possible to prevent falls.   Fall Prevention and Home Safety Falls cause injuries and can affect all age groups. It is possible to use preventive measures to significantly decrease the likelihood of falls. There are many simple measures which can make your home safer and prevent falls. OUTDOORS  Repair cracks and edges of walkways and driveways.  Remove high doorway thresholds.  Trim shrubbery on the main path into your home.  Have good outside lighting.  Clear walkways of tools, rocks, debris, and clutter.  Check that handrails are not broken and are securely fastened. Both sides of steps should have handrails.  Have leaves, snow, and ice cleared regularly.  Use sand or salt on walkways during winter months.  In the garage, clean up grease or oil spills. BATHROOM  Install night lights.  Install grab bars by the toilet and in the tub and shower.  Use non-skid mats or decals in the tub or shower.  Place a plastic non-slip stool in the shower to sit on, if needed.  Keep floors dry and clean up all water on the floor immediately.  Remove soap buildup in the tub or shower on a regular basis.  Secure bath mats with non-slip, double-sided rug tape.  Remove throw rugs and tripping hazards from the floors. BEDROOMS  Install night lights.  Make sure a bedside light is easy to reach.  Do not use oversized bedding.  Keep a telephone by your bedside.  Have a firm chair with side arms to use for getting dressed.  Remove throw rugs and tripping hazards from the floor. KITCHEN  Keep handles on pots and pans turned toward the center of the stove. Use back burners when possible.  Clean up spills quickly and allow time for drying.  Avoid walking on wet floors.  Avoid hot utensils and  knives.  Position shelves so they are not too high or low.  Place commonly used objects within easy reach.  If necessary, use a sturdy step stool with a grab bar when reaching.  Keep electrical cables out of the way.  Do not use floor polish or wax that makes floors slippery. If you must use wax, use non-skid floor wax.  Remove throw rugs and tripping hazards from the floor. STAIRWAYS  Never leave objects on stairs.  Place handrails on both sides of stairways and use them. Fix any loose handrails. Make sure handrails on both sides of the stairways are as long as the stairs.  Check carpeting to make sure it is firmly attached along stairs. Make repairs to worn or loose carpet promptly.  Avoid placing throw rugs at the top or bottom of stairways, or properly secure the rug with carpet tape to prevent slippage. Get rid of throw rugs, if possible.  Have an electrician put in a light switch at the top and bottom of the stairs. OTHER FALL PREVENTION TIPS  Wear low-heel or rubber-soled shoes that are supportive and fit well. Wear closed toe shoes.  When using a stepladder, make sure it is fully opened and both spreaders are firmly locked. Do not climb a closed stepladder.  Add color or contrast paint or tape to grab bars and handrails in your home. Place contrasting color strips on first and last steps.  Learn and use mobility aids as  needed. Install an electrical emergency response system.  Turn on lights to avoid dark areas. Replace light bulbs that burn out immediately. Get light switches that glow.  Arrange furniture to create clear pathways. Keep furniture in the same place.  Firmly attach carpet with non-skid or double-sided tape.  Eliminate uneven floor surfaces.  Select a carpet pattern that does not visually hide the edge of steps.  Be aware of all pets. OTHER HOME SAFETY TIPS  Set the water temperature for 120 F (48.8 C).  Keep emergency numbers on or near the  telephone.  Keep smoke detectors on every level of the home and near sleeping areas. Document Released: 11/24/2002 Document Revised: 06/04/2012 Document Reviewed: 02/23/2012 [facility] Patient Information 2015 [place], [place]. This information is not intended to replace advice given to you by your health care provider. Make sure you discuss any questions you have with your health care provider.

## 2015-03-15 NOTE — ED Notes (Signed)
Pt ambulated. No complaints noted at this time

## 2015-03-15 NOTE — ED Provider Notes (Signed)
CSN: [id number]     Arrival date & time 03/14/15  2206 History   First MD Initiated Contact with Patient 03/14/15 2216     Chief Complaint  Patient presents with  . Fall     (Consider location/radiation/quality/duration/timing/severity/associated sxs/prior Treatment) HPI Comments: Pt comes in with cc of fall. Pt is elderly, has parkinson's and resides at an assisted living. Pt reportedly has had 4 falls this week. The patient doesn't remember the fall today. He is aox3. Pt has no chest pain, palpitations, dib. He has no hx of CHF, CAD per our records.  Patient is a 80 y.o. male presenting with fall. The history is provided by the patient.  Fall Pertinent negatives include no chest pain, no abdominal pain and no shortness of breath.    Past Medical History  Diagnosis Date  . Hearing aid worn   . Hypertension   . Parkinson disease   . Pneumonia   . Diabetes mellitus type II   . Arthritis   . Bipolar disorder   . Anxiety   . Depression   . MRSA infection (methicillin-resistant Staphylococcus aureus)   . Chronic indwelling Foley catheter   . C2 cervical fracture     due to pt fall  . SIRS (systemic inflammatory response syndrome)   . Neuromuscular disorder   . Memory loss   . Benign prostate hyperplasia   . Memory difficulty 08/21/2014   Past Surgical History  Procedure Laterality Date  . Total hip arthroplasty    . Video bronchoscopy  12/16/2012    Procedure: VIDEO BRONCHOSCOPY;  Surgeon: Melrose Nakayama, MD;  Location: [place];  Service: Thoracic;  Laterality: Right;  . Video assisted thoracoscopy (vats)/empyema  12/16/2012    Procedure: VIDEO ASSISTED THORACOSCOPY (VATS)/EMPYEMA;  Surgeon: Melrose Nakayama, MD;  Location: [place];  Service: Thoracic;  Laterality: Right;   Family History  Problem Relation Age of Onset  . Depression Father   . Cancer Mother   . Heart disease Mother    History  Substance Use Topics  . Smoking status: Never Smoker   . Smokeless  tobacco: Never Used  . Alcohol Use: No     Comment: occasional    Review of Systems  Respiratory: Negative for shortness of breath.   Cardiovascular: Negative for chest pain and palpitations.  Gastrointestinal: Negative for abdominal pain.  Genitourinary: Negative for dysuria.  Skin: Negative for wound.      Allergies  Cholestatin  Home Medications   Prior to Admission medications   Medication Sig Start Date End Date Taking? Authorizing Provider  acetaminophen (TYLENOL) 325 MG tablet Take 650 mg by mouth every 6 (six) hours as needed for mild pain.    Historical Provider, MD  albuterol (PROVENTIL) (2.5 MG/3ML) 0.083% nebulizer solution Take 3 mLs (2.5 mg total) by nebulization every 2 (two) hours as needed for wheezing or shortness of breath. 04/21/14   Hosie Poisson, MD  amLODipine (NORVASC) 5 MG tablet Take 1 tablet (5 mg total) by mouth daily. 02/03/13   Janece Canterbury, MD  aspirin 81 MG chewable tablet Chew 81 mg by mouth daily.    Historical Provider, MD  bisacodyl (DULCOLAX) 10 MG suppository Place 10 mg rectally daily as needed for moderate constipation.    Historical Provider, MD  carbidopa-levodopa (SINEMET IR) 25-250 MG per tablet Take 1 tablet by mouth 3 (three) times daily.    Historical Provider, MD  clonazePAM (KLONOPIN) 0.5 MG tablet Take 1/2 tab at noon and 1 tab  at bed time 12/02/14   Kathlee Nations, MD  entacapone (COMTAN) 200 MG tablet Take 200 mg by mouth 3 (three) times daily.    Historical Provider, MD  fluticasone (FLONASE) 50 MCG/ACT nasal spray Place 2 sprays into both nostrils daily.    Historical Provider, MD  GLUCERNA (GLUCERNA) LIQD Take 237 mLs by mouth daily.     Historical Provider, MD  guaiFENesin (MUCINEX) 600 MG 12 hr tablet Take 1 tablet (600 mg total) by mouth 2 (two) times daily. 04/21/14   Hosie Poisson, MD  hydrALAZINE (APRESOLINE) 25 MG tablet Take 25 mg by mouth 3 (three) times daily.    Historical Provider, MD  ipratropium-albuterol (DUONEB)  0.5-2.5 (3) MG/3ML SOLN Take 3 mLs by nebulization 3 (three) times daily. 04/21/14   Hosie Poisson, MD  lamoTRIgine (LAMICTAL) 150 MG tablet Take 1 tablet (150 mg total) by mouth 2 (two) times daily. 12/02/14   Kathlee Nations, MD  lisinopril (PRINIVIL,ZESTRIL) 10 MG tablet Take 1 tablet (10 mg total) by mouth daily. 02/03/13   Janece Canterbury, MD  loratadine (CLARITIN) 10 MG tablet Take 10 mg by mouth daily as needed for allergies.    Historical Provider, MD  mineral oil-hydrophilic petrolatum (AQUAPHOR) ointment Apply 1 application topically 2 (two) times daily as needed for dry skin. For dry skin on heels    Historical Provider, MD  Multiple Vitamin (MULTIVITAMIN WITH MINERALS) TABS tablet Take 1 tablet by mouth daily.    Historical Provider, MD  nitrofurantoin, macrocrystal-monohydrate, (MACROBID) 100 MG capsule Take 1 capsule (100 mg total) by mouth 2 (two) times daily. 03/12/15   Quintella Reichert, MD  pantoprazole (PROTONIX) 40 MG tablet Take 1 tablet (40 mg total) by mouth daily at 12 noon. 04/21/14   Hosie Poisson, MD  polyethylene glycol (MIRALAX / GLYCOLAX) packet Take 17 g by mouth daily.    Historical Provider, MD  QUEtiapine (SEROQUEL) 50 MG tablet Take 1 tablet (50 mg total) by mouth at bedtime. 12/02/14   Kathlee Nations, MD  Red Yeast Rice Extract (RED YEAST RICE PO) Take 2 tablets by mouth at bedtime.     Historical Provider, MD  sertraline (ZOLOFT) 100 MG tablet Take 2 tablets (200 mg total) by mouth daily. 10/01/14   Kathlee Nations, MD  traZODone (DESYREL) 100 MG tablet Take 1 tablet (100 mg total) by mouth at bedtime. 12/02/14   Kathlee Nations, MD   BP 112/62 mmHg  Pulse 60  Temp(Src) 98.5 F (36.9 C) (Oral)  Resp 22  Ht 5' 10.5" (1.791 m)  Wt 165 lb (74.844 kg)  BMI 23.33 kg/m2  SpO2 95% Physical Exam  Constitutional: He is oriented to person, place, and time. He appears well-developed.  HENT:  Head: Normocephalic and atraumatic.  Eyes: Conjunctivae and EOM are normal. Pupils are  equal, round, and reactive to light.  Neck: Normal range of motion. Neck supple. No JVD present.  No midline c-spine tenderness, pt able to turn head to 45 degrees bilaterally without any pain and able to flex neck to the chest and extend without any pain or neurologic symptoms.   Cardiovascular: Normal rate and regular rhythm.   Pulmonary/Chest: Effort normal and breath sounds normal.  Abdominal: Soft. Bowel sounds are normal. He exhibits no distension. There is no tenderness. There is no rebound and no guarding.  Musculoskeletal:  Head to toe evaluation shows no hematoma, bleeding of the scalp, no facial abrasions, step offs, crepitus, no tenderness to palpation of the bilateral  upper and lower extremities, no gross deformities, no chest tenderness, no pelvic pain.   Neurological: He is alert and oriented to person, place, and time.  Skin: Skin is warm.  Nursing note and vitals reviewed.   ED Course  Procedures (including critical care time) Labs Review Labs Reviewed  URINALYSIS, ROUTINE W REFLEX MICROSCOPIC    Imaging Review Ct Head Wo Contrast  03/15/2015   CLINICAL DATA:  Unwitnessed fall, found on floor.  EXAM: CT HEAD WITHOUT CONTRAST  TECHNIQUE: Contiguous axial images were obtained from the base of the skull through the vertex without intravenous contrast.  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 Mild chronic small vessel ischemia. No intracranial hemorrhage, mass effect, or midline shift. No hydrocephalus. The basilar cisterns are patent. No evidence of territorial infarct. No intracranial fluid collection. Calvarium is intact. Included paranasal sinuses and mastoid air cells are well aerated.  IMPRESSION: 1.  No acute intracranial abnormality. 2. 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   Electronically Signed   By: Jeb Levering M.D.   On: 03/15/2015 00:46     EKG Interpretation None      MDM   Final diagnoses:  Fall    Pt comes in with cc of fall. CT head done - and is neg. No Cardiac risk factors, tele monitoring conducted in the ER whilst patient is here, and there is no dysrhythmia appreciated. Pt will be ambulated, if he does well, will d.c.   Varney Biles, MD 03/15/15 [phone]

## 2015-03-15 NOTE — ED Notes (Signed)
Urine sample ordered by EDP, pt refusing to give urine sample or get an I & O cath, MD notified.

## 2015-03-15 NOTE — ED Notes (Signed)
Report called to NH, [facility] contacted for transportation, pt refusing to sing for dc.

## 2015-03-15 NOTE — ED Notes (Signed)
[facility] contacted to transport patient back to [facility]

## 2015-03-15 NOTE — ED Notes (Signed)
Attempted to obtain a urine sample from pt. Pt refused RN aware

## 2015-03-16 ENCOUNTER — Emergency Department (HOSPITAL_COMMUNITY): Payer: Medicare Other

## 2015-03-16 ENCOUNTER — Inpatient Hospital Stay (HOSPITAL_COMMUNITY)
Admission: EM | Admit: 2015-03-16 | Discharge: 2015-03-22 | DRG: 193 | Disposition: A | Discharge status: Skilled Nursing Facility | Payer: Medicare Other | Attending: Internal Medicine | Admitting: Internal Medicine

## 2015-03-16 ENCOUNTER — Encounter (HOSPITAL_COMMUNITY): Payer: Self-pay

## 2015-03-16 ENCOUNTER — Telehealth (HOSPITAL_COMMUNITY): Payer: Self-pay

## 2015-03-16 DIAGNOSIS — Z79899 Other long term (current) drug therapy: Secondary | ICD-10-CM

## 2015-03-16 DIAGNOSIS — G20 Parkinson's disease: Secondary | ICD-10-CM | POA: Diagnosis present

## 2015-03-16 DIAGNOSIS — I10 Essential (primary) hypertension: Secondary | ICD-10-CM | POA: Diagnosis present

## 2015-03-16 DIAGNOSIS — E119 Type 2 diabetes mellitus without complications: Secondary | ICD-10-CM

## 2015-03-16 DIAGNOSIS — F319 Bipolar disorder, unspecified: Secondary | ICD-10-CM | POA: Diagnosis present

## 2015-03-16 DIAGNOSIS — G709 Myoneural disorder, unspecified: Secondary | ICD-10-CM | POA: Diagnosis present

## 2015-03-16 DIAGNOSIS — B965 Pseudomonas (aeruginosa) (mallei) (pseudomallei) as the cause of diseases classified elsewhere: Secondary | ICD-10-CM | POA: Diagnosis present

## 2015-03-16 DIAGNOSIS — L89159 Pressure ulcer of sacral region, unspecified stage: Secondary | ICD-10-CM | POA: Diagnosis present

## 2015-03-16 DIAGNOSIS — N390 Urinary tract infection, site not specified: Secondary | ICD-10-CM | POA: Diagnosis present

## 2015-03-16 DIAGNOSIS — R0902 Hypoxemia: Secondary | ICD-10-CM | POA: Diagnosis present

## 2015-03-16 DIAGNOSIS — N400 Enlarged prostate without lower urinary tract symptoms: Secondary | ICD-10-CM | POA: Diagnosis present

## 2015-03-16 DIAGNOSIS — R05 Cough: Secondary | ICD-10-CM | POA: Diagnosis present

## 2015-03-16 DIAGNOSIS — R296 Repeated falls: Secondary | ICD-10-CM | POA: Diagnosis present

## 2015-03-16 DIAGNOSIS — R413 Other amnesia: Secondary | ICD-10-CM | POA: Diagnosis present

## 2015-03-16 DIAGNOSIS — E875 Hyperkalemia: Secondary | ICD-10-CM | POA: Diagnosis not present

## 2015-03-16 DIAGNOSIS — R509 Fever, unspecified: Secondary | ICD-10-CM | POA: Diagnosis not present

## 2015-03-16 DIAGNOSIS — Z66 Do not resuscitate: Secondary | ICD-10-CM | POA: Diagnosis present

## 2015-03-16 DIAGNOSIS — Z7982 Long term (current) use of aspirin: Secondary | ICD-10-CM

## 2015-03-16 DIAGNOSIS — F419 Anxiety disorder, unspecified: Secondary | ICD-10-CM | POA: Diagnosis present

## 2015-03-16 DIAGNOSIS — Z888 Allergy status to other drugs, medicaments and biological substances status: Secondary | ICD-10-CM

## 2015-03-16 DIAGNOSIS — Z8614 Personal history of Methicillin resistant Staphylococcus aureus infection: Secondary | ICD-10-CM

## 2015-03-16 DIAGNOSIS — W19XXXA Unspecified fall, initial encounter: Secondary | ICD-10-CM | POA: Diagnosis not present

## 2015-03-16 DIAGNOSIS — M1990 Unspecified osteoarthritis, unspecified site: Secondary | ICD-10-CM | POA: Diagnosis present

## 2015-03-16 DIAGNOSIS — R0989 Other specified symptoms and signs involving the circulatory and respiratory systems: Secondary | ICD-10-CM | POA: Diagnosis present

## 2015-03-16 DIAGNOSIS — I5032 Chronic diastolic (congestive) heart failure: Secondary | ICD-10-CM | POA: Diagnosis present

## 2015-03-16 DIAGNOSIS — F0280 Dementia in other diseases classified elsewhere without behavioral disturbance: Secondary | ICD-10-CM | POA: Diagnosis present

## 2015-03-16 DIAGNOSIS — Z96649 Presence of unspecified artificial hip joint: Secondary | ICD-10-CM | POA: Diagnosis present

## 2015-03-16 DIAGNOSIS — G92 Toxic encephalopathy: Secondary | ICD-10-CM | POA: Diagnosis present

## 2015-03-16 DIAGNOSIS — J101 Influenza due to other identified influenza virus with other respiratory manifestations: Principal | ICD-10-CM | POA: Diagnosis present

## 2015-03-16 DIAGNOSIS — R4182 Altered mental status, unspecified: Secondary | ICD-10-CM

## 2015-03-16 LAB — URINALYSIS, ROUTINE W REFLEX MICROSCOPIC
Bilirubin Urine: NEGATIVE
Glucose, UA: NEGATIVE mg/dL
Ketones, ur: 15 mg/dL — AB
Nitrite: NEGATIVE
Protein, ur: 30 mg/dL — AB
SPECIFIC GRAVITY, URINE: 1.025 (ref 1.005–1.030)
Urobilinogen, UA: 1 mg/dL (ref 0.0–1.0)
pH: 5.5 (ref 5.0–8.0)

## 2015-03-16 LAB — GLUCOSE, CAPILLARY
GLUCOSE-CAPILLARY: 119 mg/dL — AB (ref 70–99)
GLUCOSE-CAPILLARY: 147 mg/dL — AB (ref 70–99)
Glucose-Capillary: 118 mg/dL — ABNORMAL HIGH (ref 70–99)

## 2015-03-16 LAB — CBC
HEMATOCRIT: 39.3 % (ref 39.0–52.0)
Hemoglobin: 13.1 g/dL (ref 13.0–17.0)
MCH: 29.8 pg (ref 26.0–34.0)
MCHC: 33.3 g/dL (ref 30.0–36.0)
MCV: 89.5 fL (ref 78.0–100.0)
Platelets: 125 10*3/uL — ABNORMAL LOW (ref 150–400)
RBC: 4.39 MIL/uL (ref 4.22–5.81)
RDW: 14.4 % (ref 11.5–15.5)
WBC: 4 10*3/uL (ref 4.0–10.5)

## 2015-03-16 LAB — URINE MICROSCOPIC-ADD ON

## 2015-03-16 LAB — COMPREHENSIVE METABOLIC PANEL
ALBUMIN: 3.4 g/dL — AB (ref 3.5–5.2)
ALT: 22 U/L (ref 0–53)
AST: 54 U/L — ABNORMAL HIGH (ref 0–37)
Alkaline Phosphatase: 64 U/L (ref 39–117)
Anion gap: 11 (ref 5–15)
BILIRUBIN TOTAL: 0.7 mg/dL (ref 0.3–1.2)
BUN: 30 mg/dL — ABNORMAL HIGH (ref 6–23)
CALCIUM: 8.5 mg/dL (ref 8.4–10.5)
CO2: 21 mmol/L (ref 19–32)
CREATININE: 1.33 mg/dL (ref 0.50–1.35)
Chloride: 105 mmol/L (ref 96–112)
GFR, EST AFRICAN AMERICAN: 57 mL/min — AB (ref >90)
GFR, EST NON AFRICAN AMERICAN: 50 mL/min — AB (ref >90)
Glucose, Bld: 115 mg/dL — ABNORMAL HIGH (ref 70–99)
Potassium: 3.7 mmol/L (ref 3.5–5.1)
SODIUM: 137 mmol/L (ref 135–145)
Total Protein: 4.9 g/dL — ABNORMAL LOW (ref 6.0–8.3)

## 2015-03-16 LAB — I-STAT CG4 LACTIC ACID, ED: Lactic Acid, Venous: 1.33 mmol/L (ref 0.5–2.0)

## 2015-03-16 LAB — MAGNESIUM: Magnesium: 2 mg/dL (ref 1.5–2.5)

## 2015-03-16 LAB — CBG MONITORING, ED: GLUCOSE-CAPILLARY: 125 mg/dL — AB (ref 70–99)

## 2015-03-16 LAB — INFLUENZA PANEL BY PCR (TYPE A & B)
H1N1FLUPCR: DETECTED — AB
Influenza A By PCR: POSITIVE — AB
Influenza B By PCR: NEGATIVE

## 2015-03-16 LAB — MRSA PCR SCREENING: MRSA BY PCR: NEGATIVE

## 2015-03-16 MED ORDER — BAZA PROTECT EX CREA: TOPICAL_CREAM | Freq: Two times a day (BID) | CUTANEOUS | Status: DC | PRN

## 2015-03-16 MED ORDER — QUETIAPINE FUMARATE 50 MG PO TABS
50.0000 mg | ORAL_TABLET | Freq: Every day | ORAL | Status: DC
  Administered 2015-03-16 – 2015-03-21 (×6): 50 mg via ORAL
  Filled 2015-03-16 (×7): qty 1

## 2015-03-16 MED ORDER — INSULIN ASPART 100 UNIT/ML ~~LOC~~ SOLN
0.0000 [IU] | Freq: Three times a day (TID) | SUBCUTANEOUS | Status: DC
  Administered 2015-03-16 – 2015-03-17 (×2): 1 [IU] via SUBCUTANEOUS
  Administered 2015-03-17: 2 [IU] via SUBCUTANEOUS
  Administered 2015-03-18: 3 [IU] via SUBCUTANEOUS
  Administered 2015-03-18 – 2015-03-20 (×4): 2 [IU] via SUBCUTANEOUS
  Administered 2015-03-20: 1 [IU] via SUBCUTANEOUS
  Administered 2015-03-21: 2 [IU] via SUBCUTANEOUS
  Administered 2015-03-21 (×2): 1 [IU] via SUBCUTANEOUS

## 2015-03-16 MED ORDER — ONDANSETRON HCL 4 MG/2ML IJ SOLN: 4.0000 mg | Freq: Four times a day (QID) | INTRAMUSCULAR | Status: DC | PRN

## 2015-03-16 MED ORDER — HYDROCERIN EX CREA
1.0000 "application " | TOPICAL_CREAM | Freq: Two times a day (BID) | CUTANEOUS | Status: DC
  Administered 2015-03-16 – 2015-03-21 (×11): 1 via TOPICAL
  Filled 2015-03-16: qty 113

## 2015-03-16 MED ORDER — SODIUM CHLORIDE 0.9 % IV SOLN
INTRAVENOUS | Status: AC
  Administered 2015-03-16: 07:00:00 via INTRAVENOUS

## 2015-03-16 MED ORDER — LORATADINE 10 MG PO TABS
10.0000 mg | ORAL_TABLET | Freq: Every day | ORAL | Status: DC | PRN
  Administered 2015-03-16: 10 mg via ORAL
  Filled 2015-03-16 (×2): qty 1

## 2015-03-16 MED ORDER — AQUAPHOR EX OINT
1.0000 "application " | TOPICAL_OINTMENT | Freq: Two times a day (BID) | CUTANEOUS | Status: DC | PRN
  Filled 2015-03-16: qty 50

## 2015-03-16 MED ORDER — ASPIRIN 81 MG PO CHEW
81.0000 mg | CHEWABLE_TABLET | Freq: Every day | ORAL | Status: DC
  Administered 2015-03-16 – 2015-03-22 (×7): 81 mg via ORAL
  Filled 2015-03-16 (×7): qty 1

## 2015-03-16 MED ORDER — ALUM & MAG HYDROXIDE-SIMETH 200-200-20 MG/5ML PO SUSP: 30.0000 mL | Freq: Four times a day (QID) | ORAL | Status: DC | PRN

## 2015-03-16 MED ORDER — ALBUTEROL SULFATE (2.5 MG/3ML) 0.083% IN NEBU: 2.5000 mg | INHALATION_SOLUTION | RESPIRATORY_TRACT | Status: DC | PRN

## 2015-03-16 MED ORDER — FUROSEMIDE 10 MG/ML IJ SOLN
20.0000 mg | Freq: Once | INTRAMUSCULAR | Status: AC
  Administered 2015-03-16: 20 mg via INTRAVENOUS
  Filled 2015-03-16: qty 2

## 2015-03-16 MED ORDER — OSELTAMIVIR PHOSPHATE 75 MG PO CAPS
75.0000 mg | ORAL_CAPSULE | Freq: Two times a day (BID) | ORAL | Status: DC
  Filled 2015-03-16 (×2): qty 1

## 2015-03-16 MED ORDER — PANTOPRAZOLE SODIUM 40 MG PO TBEC
40.0000 mg | DELAYED_RELEASE_TABLET | Freq: Every day | ORAL | Status: DC
  Administered 2015-03-16 – 2015-03-22 (×7): 40 mg via ORAL
  Filled 2015-03-16 (×6): qty 1

## 2015-03-16 MED ORDER — ONDANSETRON HCL 4 MG PO TABS: 4.0000 mg | ORAL_TABLET | Freq: Four times a day (QID) | ORAL | Status: DC | PRN

## 2015-03-16 MED ORDER — IPRATROPIUM-ALBUTEROL 0.5-2.5 (3) MG/3ML IN SOLN
3.0000 mL | Freq: Four times a day (QID) | RESPIRATORY_TRACT | Status: DC
  Administered 2015-03-16 (×2): 3 mL via RESPIRATORY_TRACT
  Filled 2015-03-16: qty 3

## 2015-03-16 MED ORDER — CARBIDOPA-LEVODOPA 25-250 MG PO TABS
1.0000 | ORAL_TABLET | Freq: Three times a day (TID) | ORAL | Status: DC
  Administered 2015-03-16 – 2015-03-22 (×19): 1 via ORAL
  Filled 2015-03-16 (×22): qty 1

## 2015-03-16 MED ORDER — HYDROCODONE-ACETAMINOPHEN 5-325 MG PO TABS: 1.0000 | ORAL_TABLET | ORAL | Status: DC | PRN

## 2015-03-16 MED ORDER — SERTRALINE HCL 100 MG PO TABS
200.0000 mg | ORAL_TABLET | Freq: Every day | ORAL | Status: DC
  Administered 2015-03-16 – 2015-03-22 (×7): 200 mg via ORAL
  Filled 2015-03-16 (×7): qty 2

## 2015-03-16 MED ORDER — GLUCERNA SHAKE PO LIQD
237.0000 mL | Freq: Every day | ORAL | Status: DC
  Administered 2015-03-16 – 2015-03-22 (×7): 237 mL via ORAL
  Filled 2015-03-16 (×6): qty 237

## 2015-03-16 MED ORDER — FLUTICASONE PROPIONATE 50 MCG/ACT NA SUSP
2.0000 | Freq: Every day | NASAL | Status: DC
  Administered 2015-03-16 – 2015-03-22 (×7): 2 via NASAL
  Filled 2015-03-16 (×2): qty 16

## 2015-03-16 MED ORDER — LAMOTRIGINE 150 MG PO TABS
150.0000 mg | ORAL_TABLET | Freq: Two times a day (BID) | ORAL | Status: DC
  Administered 2015-03-16 – 2015-03-22 (×13): 150 mg via ORAL
  Filled 2015-03-16 (×14): qty 1

## 2015-03-16 MED ORDER — POLYETHYLENE GLYCOL 3350 17 G PO PACK
17.0000 g | PACK | Freq: Every day | ORAL | Status: DC
  Administered 2015-03-16 – 2015-03-22 (×7): 17 g via ORAL
  Filled 2015-03-16 (×7): qty 1

## 2015-03-16 MED ORDER — CLONAZEPAM 0.5 MG PO TABS
0.2500 mg | ORAL_TABLET | Freq: Two times a day (BID) | ORAL | Status: DC
  Administered 2015-03-16 – 2015-03-22 (×13): 0.25 mg via ORAL
  Filled 2015-03-16 (×13): qty 1

## 2015-03-16 MED ORDER — ENTACAPONE 200 MG PO TABS
200.0000 mg | ORAL_TABLET | Freq: Three times a day (TID) | ORAL | Status: DC
  Administered 2015-03-16 – 2015-03-22 (×19): 200 mg via ORAL
  Filled 2015-03-16 (×22): qty 1

## 2015-03-16 MED ORDER — AMLODIPINE BESYLATE 5 MG PO TABS
5.0000 mg | ORAL_TABLET | Freq: Every day | ORAL | Status: DC
  Administered 2015-03-16 – 2015-03-22 (×7): 5 mg via ORAL
  Filled 2015-03-16 (×7): qty 1

## 2015-03-16 MED ORDER — ACETAMINOPHEN 650 MG RE SUPP
650.0000 mg | Freq: Once | RECTAL | Status: AC
  Administered 2015-03-16: 650 mg via RECTAL
  Filled 2015-03-16: qty 1

## 2015-03-16 MED ORDER — OSELTAMIVIR PHOSPHATE 30 MG PO CAPS
30.0000 mg | ORAL_CAPSULE | Freq: Two times a day (BID) | ORAL | Status: DC
  Administered 2015-03-16: 30 mg via ORAL
  Filled 2015-03-16 (×2): qty 1

## 2015-03-16 MED ORDER — ACETAMINOPHEN 650 MG RE SUPP: 650.0000 mg | Freq: Four times a day (QID) | RECTAL | Status: DC | PRN

## 2015-03-16 MED ORDER — DOCUSATE SODIUM 100 MG PO CAPS
100.0000 mg | ORAL_CAPSULE | Freq: Two times a day (BID) | ORAL | Status: DC
  Administered 2015-03-16 – 2015-03-22 (×12): 100 mg via ORAL
  Filled 2015-03-16 (×14): qty 1

## 2015-03-16 MED ORDER — GUAIFENESIN ER 600 MG PO TB12
600.0000 mg | ORAL_TABLET | Freq: Two times a day (BID) | ORAL | Status: DC
  Administered 2015-03-16 – 2015-03-22 (×13): 600 mg via ORAL
  Filled 2015-03-16 (×14): qty 1

## 2015-03-16 MED ORDER — LISINOPRIL 10 MG PO TABS
10.0000 mg | ORAL_TABLET | Freq: Every day | ORAL | Status: DC
  Administered 2015-03-16 – 2015-03-21 (×6): 10 mg via ORAL
  Filled 2015-03-16 (×6): qty 1

## 2015-03-16 MED ORDER — SODIUM CHLORIDE 0.9 % IV SOLN: 250.0000 mL | INTRAVENOUS | Status: DC | PRN

## 2015-03-16 MED ORDER — TRAZODONE HCL 100 MG PO TABS
100.0000 mg | ORAL_TABLET | Freq: Every day | ORAL | Status: DC
  Administered 2015-03-16 – 2015-03-21 (×6): 100 mg via ORAL
  Filled 2015-03-16 (×7): qty 1

## 2015-03-16 MED ORDER — BISACODYL 10 MG RE SUPP: 10.0000 mg | Freq: Every day | RECTAL | Status: DC | PRN

## 2015-03-16 MED ORDER — BACITRACIN-NEOMYCIN-POLYMYXIN OINTMENT TUBE
TOPICAL_OINTMENT | Freq: Two times a day (BID) | CUTANEOUS | Status: DC
  Administered 2015-03-16 – 2015-03-18 (×6): via TOPICAL
  Administered 2015-03-19: 1 via TOPICAL
  Administered 2015-03-19 – 2015-03-21 (×4): via TOPICAL
  Filled 2015-03-16: qty 15

## 2015-03-16 MED ORDER — SODIUM CHLORIDE 0.9 % IJ SOLN
3.0000 mL | Freq: Two times a day (BID) | INTRAMUSCULAR | Status: DC
  Administered 2015-03-16 – 2015-03-22 (×13): 3 mL via INTRAVENOUS

## 2015-03-16 MED ORDER — ACETAMINOPHEN 325 MG PO TABS: 650.0000 mg | ORAL_TABLET | Freq: Four times a day (QID) | ORAL | Status: DC | PRN

## 2015-03-16 MED ORDER — SODIUM CHLORIDE 0.9 % IJ SOLN: 3.0000 mL | INTRAMUSCULAR | Status: DC | PRN

## 2015-03-16 MED ORDER — ACETAMINOPHEN 325 MG PO TABS: 650.0000 mg | ORAL_TABLET | Freq: Once | ORAL | Status: DC

## 2015-03-16 MED ORDER — FOSFOMYCIN TROMETHAMINE 3 G PO PACK
3.0000 g | PACK | Freq: Once | ORAL | Status: AC
  Administered 2015-03-16: 3 g via ORAL
  Filled 2015-03-16: qty 3

## 2015-03-16 MED ORDER — HYDRALAZINE HCL 25 MG PO TABS
25.0000 mg | ORAL_TABLET | Freq: Three times a day (TID) | ORAL | Status: DC
  Administered 2015-03-16 – 2015-03-22 (×19): 25 mg via ORAL
  Filled 2015-03-16 (×21): qty 1

## 2015-03-16 NOTE — Consult Note (Addendum)
WOC wound consult note Reason for Consult: Consult requested for multiple abrasions.  Family member in room states pt has fallen a total of 5 times within this week. At least one time he fell face down on the carpet, and another time he fell and hurt his buttocks.  He was complaining of pain to the buttocks for several days prior to admission.  Wound type: Buttocks with 2 areas of dark purple deep tissue injuries; 2X2cm, one to each side near rectum in a butterfly-shaped pattern. 3 cm of stage 1 nonblanchable erythremia to skin surrounding. No open wounds or drainage at this time, appearance is consistent with prolonged pressure from several days ago. Pressure Ulcer POA: Yes Left outer ankle with stage 1 wound, 1X1cm dark red nonblanchable skin without open wound or drainage. Left outer knee with partial thickness abrasion; 10X4X.1cm in patchy areas with moist red wound bed and mod amt yellow drainage, no odor. Multiple areas to bilat arms and legs with scabs and bruises; no topical treatment needed to these sites. Measurement: Face with multiple red dry scabbed abrasion areas: Chin 2X3cm Bridge of nose 2X2cm Forehead: 2X2cm, .5X1cm, 1X1cm, 3X2cm Wound bed: No odor or drainage to any sites on the face.  Appearance is consistent with "rug-burn abrasions." Dressing procedure/placement/frequency: Foam dressing to bilat buttocks and left outer knee to absorb drainage and promote healing.  Deep tissue injuries are high-risk to evolve into full thickness tissue loss despite optimal care. Position off affected area to reduce pressure. Neosporin to promote moist healing to abrasions on face. Discussed with patient and family member at bedside and they observed sites.  They deny further questions at this time. Please re-consult if further assistance is needed.  Thank-you,  Julien Girt MSN, [place], [place], [place], [place]

## 2015-03-16 NOTE — Care Management Note (Signed)
    Page 1 of 1   03/22/2015     2:07:59 PM CARE MANAGEMENT NOTE 03/22/2015  Patient:  [name], [name]   Account Number:  [id number]  Date Initiated:  03/16/2015  Documentation initiated by:  Ayisha Pol  Subjective/Objective Assessment:   Pt adm on 03/16/15 with mult falls, fever.  PTA, pt resided at [facility].     Action/Plan:   PT/OT consults pending.  Will follow progress.  CSW consulted to facilitate poss return to ALF when medically stable.   Anticipated DC Date:  03/22/2015   Anticipated DC Plan:  SKILLED NURSING FACILITY  In-house referral  Clinical Social Worker      DC Planning Services  CM consult      Choice offered to / List presented to:             Status of service:  Completed, signed off Medicare Important Message given?  YES (If response is "NO", the following Medicare IM given date fields will be blank) Date Medicare IM given:  03/19/2015 Medicare IM given by:  Zanae Kuehnle Date Additional Medicare IM given:  03/22/2015 Additional Medicare IM given by:  Tannon Peerson  Discharge Disposition:  [place]  Per UR Regulation:  Reviewed for med. necessity/level of care/duration of stay  If discussed at [place] of Stay Meetings, dates discussed:    Comments:  03/22/15 Ellan Lambert, RN, BSN [phone] Pt for dc to SNF today, per CSW arrangements.  03/18/15 Ellan Lambert, RN, BSN [phone] Per CSW, daughter now requesting SNF at dc.  CSW to follow to facilitate dc to SNF when medically stable.  Pt started on IV steroids today due to worsening resp status.  Will follow progress.  03/16/15 Ellan Lambert, RN, BSN [phone] CM Referral for help with scheduling appt with Dr. Jannifer Franklin: pt apparently has had 3 ED visits within a small amt of time, and has cancelled last appt with his neurologist for his Parkinson's Disease.  Appt scheduled for April 11 at 3:00pm, per request.

## 2015-03-16 NOTE — ED Provider Notes (Signed)
CSN: [id number]     Arrival date & time 03/16/15  0320 History   First MD Initiated Contact with Patient 03/16/15 0404     Chief Complaint  Patient presents with  . Altered Mental Status  . Fall     (Consider location/radiation/quality/duration/timing/severity/associated sxs/prior Treatment) HPI Comments: Patient is a 80 year old male with extensive past medical history including type 2 diabetes, Parkinson's disease, hypertension, bipolar, CHF, empyema with thoracotomy. He is brought by EMS for evaluation of a fall. Patient is a resident of [facility]. He is apparently suffered multiple falls this week. He was seen here yesterday for similar injury. Head CT at that time was negative and he was returned home. He returns tonight after yet another fall. Is my understanding this is his fourth and possibly fifth fall in the past week.  Patient has little to the history due to his above medical problems.  Patient is a 80 y.o. male presenting with altered mental status. The history is provided by the patient.  Altered Mental Status Presenting symptoms: confusion   Presenting symptoms: no behavior changes   Severity:  Moderate Most recent episode:  Today Episode history:  Multiple Duration:  1 week Timing:  Constant Progression:  Worsening Chronicity:  New Context: nursing home resident     Past Medical History  Diagnosis Date  . Hearing aid worn   . Hypertension   . Parkinson disease   . Pneumonia   . Diabetes mellitus type II   . Arthritis   . Bipolar disorder   . Anxiety   . Depression   . MRSA infection (methicillin-resistant Staphylococcus aureus)   . Chronic indwelling Foley catheter   . C2 cervical fracture     due to pt fall  . SIRS (systemic inflammatory response syndrome)   . Neuromuscular disorder   . Memory loss   . Benign prostate hyperplasia   . Memory difficulty 08/21/2014   Past Surgical History  Procedure Laterality Date  . Total hip  arthroplasty    . Video bronchoscopy  12/16/2012    Procedure: VIDEO BRONCHOSCOPY;  Surgeon: Melrose Nakayama, MD;  Location: [place];  Service: Thoracic;  Laterality: Right;  . Video assisted thoracoscopy (vats)/empyema  12/16/2012    Procedure: VIDEO ASSISTED THORACOSCOPY (VATS)/EMPYEMA;  Surgeon: Melrose Nakayama, MD;  Location: [place];  Service: Thoracic;  Laterality: Right;   Family History  Problem Relation Age of Onset  . Depression Father   . Cancer Mother   . Heart disease Mother    History  Substance Use Topics  . Smoking status: Never Smoker   . Smokeless tobacco: Never Used  . Alcohol Use: No     Comment: occasional    Review of Systems  Psychiatric/Behavioral: Positive for confusion.  All other systems reviewed and are negative.     Allergies  Cholestatin  Home Medications   Prior to Admission medications   Medication Sig Start Date End Date Taking? Authorizing Provider  acetaminophen (TYLENOL) 325 MG tablet Take 650 mg by mouth every 6 (six) hours as needed for mild pain.   Yes Historical Provider, MD  albuterol (PROVENTIL) (2.5 MG/3ML) 0.083% nebulizer solution Take 3 mLs (2.5 mg total) by nebulization every 2 (two) hours as needed for wheezing or shortness of breath. 04/21/14  Yes Hosie Poisson, MD  amLODipine (NORVASC) 5 MG tablet Take 1 tablet (5 mg total) by mouth daily. 02/03/13  Yes Janece Canterbury, MD  aspirin 81 MG chewable tablet Chew 81 mg  by mouth daily.   Yes Historical Provider, MD  bisacodyl (DULCOLAX) 10 MG suppository Place 10 mg rectally daily as needed for moderate constipation.   Yes Historical Provider, MD  carbidopa-levodopa (SINEMET IR) 25-250 MG per tablet Take 1 tablet by mouth 3 (three) times daily.   Yes Historical Provider, MD  clonazePAM (KLONOPIN) 0.5 MG tablet Take 1/2 tab at noon and 1 tab at bed time Patient taking differently: Take 0.25-0.5 mg by mouth 2 (two) times daily. Take 1/2 tab at noon and 1 tab at bed time 12/02/14   Yes Kathlee Nations, MD  entacapone (COMTAN) 200 MG tablet Take 200 mg by mouth 3 (three) times daily.   Yes Historical Provider, MD  fluticasone (FLONASE) 50 MCG/ACT nasal spray Place 2 sprays into both nostrils daily.   Yes Historical Provider, MD  GLUCERNA (GLUCERNA) LIQD Take 237 mLs by mouth daily.    Yes Historical Provider, MD  guaiFENesin (MUCINEX) 600 MG 12 hr tablet Take 1 tablet (600 mg total) by mouth 2 (two) times daily. 04/21/14  Yes Hosie Poisson, MD  hydrALAZINE (APRESOLINE) 25 MG tablet Take 25 mg by mouth 3 (three) times daily.   Yes Historical Provider, MD  hydrocortisone 2.5 % cream Apply 1 application topically as needed (for rash).   Yes Historical Provider, MD  ipratropium-albuterol (DUONEB) 0.5-2.5 (3) MG/3ML SOLN Take 3 mLs by nebulization 3 (three) times daily. Patient taking differently: Take 3 mLs by nebulization as needed (for breathing).  04/21/14  Yes Hosie Poisson, MD  lamoTRIgine (LAMICTAL) 150 MG tablet Take 1 tablet (150 mg total) by mouth 2 (two) times daily. 12/02/14  Yes Kathlee Nations, MD  lisinopril (PRINIVIL,ZESTRIL) 10 MG tablet Take 1 tablet (10 mg total) by mouth daily. 02/03/13  Yes Janece Canterbury, MD  loratadine (CLARITIN) 10 MG tablet Take 10 mg by mouth daily as needed for allergies.   Yes Historical Provider, MD  mineral oil-hydrophilic petrolatum (AQUAPHOR) ointment Apply 1 application topically 2 (two) times daily as needed for dry skin. For dry skin on heels   Yes Historical Provider, MD  Multiple Vitamin (MULTIVITAMIN WITH MINERALS) TABS tablet Take 1 tablet by mouth daily.   Yes Historical Provider, MD  nitrofurantoin, macrocrystal-monohydrate, (MACROBID) 100 MG capsule Take 1 capsule (100 mg total) by mouth 2 (two) times daily. 03/12/15  Yes Quintella Reichert, MD  pantoprazole (PROTONIX) 40 MG tablet Take 1 tablet (40 mg total) by mouth daily at 12 noon. 04/21/14  Yes Hosie Poisson, MD  polyethylene glycol (MIRALAX / GLYCOLAX) packet Take 17 g by mouth daily.    Yes Historical Provider, MD  QUEtiapine (SEROQUEL) 50 MG tablet Take 1 tablet (50 mg total) by mouth at bedtime. 12/02/14  Yes Kathlee Nations, MD  Red Yeast Rice Extract (RED YEAST RICE PO) Take 2 tablets by mouth at bedtime.    Yes Historical Provider, MD  sertraline (ZOLOFT) 100 MG tablet Take 2 tablets (200 mg total) by mouth daily. 10/01/14  Yes Kathlee Nations, MD  Skin Protectants, Misc. (DIMETHICONE-ZINC OXIDE) cream Apply topically 2 (two) times daily as needed for dry skin.   Yes Historical Provider, MD  Skin Protectants, Misc. (EUCERIN) cream Apply 1 application topically 2 (two) times daily.   Yes Historical Provider, MD  traZODone (DESYREL) 100 MG tablet Take 1 tablet (100 mg total) by mouth at bedtime. 12/02/14  Yes Kathlee Nations, MD  Valerian Root 530 MG CAPS Take 1 capsule by mouth daily.   Yes Historical Provider,  MD   BP 120/60 mmHg  Pulse 71  Temp(Src) 101.6 F (38.7 C) (Rectal)  Resp 21  SpO2 98% Physical Exam  Constitutional: He is oriented to person, place, and time.  Patient is an elderly male in no acute distress. He is awake, and alert.  HENT:  Head: Normocephalic and atraumatic.  Mouth/Throat: Oropharynx is clear and moist.  Eyes: EOM are normal. Pupils are equal, round, and reactive to light.  Neck:  Cervical spine is immobilized in a cervical collar, however there is no bony tenderness and no step-offs.  Cardiovascular: Normal rate, regular rhythm and normal heart sounds.   No murmur heard. Pulmonary/Chest: Effort normal and breath sounds normal. No respiratory distress. He has no wheezes.  Abdominal: Soft. Bowel sounds are normal.  Musculoskeletal: Normal range of motion. He exhibits no edema.  The patient has multiple abrasions to his chest, both knees, and forehead. Most of these appear several days old, however several also appear fresh. There is nothing in need of suturing or repair.  Neurological: He is alert and oriented to person, place, and time. No  cranial nerve deficit. He exhibits normal muscle tone. Coordination normal.  Skin: Skin is warm and dry.  Nursing note and vitals reviewed.   ED Course  Procedures (including critical care time) Labs Review Labs Reviewed  CBC - Abnormal; Notable for the following:    Platelets 125 (*)    All other components within normal limits  COMPREHENSIVE METABOLIC PANEL - Abnormal; Notable for the following:    Glucose, Bld 115 (*)    BUN 30 (*)    Total Protein 4.9 (*)    Albumin 3.4 (*)    AST 54 (*)    GFR calc non Af Amer 50 (*)    GFR calc Af Amer 57 (*)    All other components within normal limits  URINALYSIS, ROUTINE W REFLEX MICROSCOPIC - Abnormal; Notable for the following:    Hgb urine dipstick SMALL (*)    Ketones, ur 15 (*)    Protein, ur 30 (*)    Leukocytes, UA SMALL (*)    All other components within normal limits  URINE MICROSCOPIC-ADD ON - Abnormal; Notable for the following:    Bacteria, UA FEW (*)    Casts GRANULAR CAST (*)    All other components within normal limits  CBG MONITORING, ED - Abnormal; Notable for the following:    Glucose-Capillary 125 (*)    All other components within normal limits  CULTURE, BLOOD (ROUTINE X 2)  CULTURE, BLOOD (ROUTINE X 2)  I-STAT CG4 LACTIC ACID, ED    Imaging Review Dg Chest 1 View  03/16/2015   CLINICAL DATA:  Status post fall, with altered mental status. Initial encounter.  EXAM: CHEST  1 VIEW  COMPARISON:  Chest radiograph performed 03/12/2015  FINDINGS: The lungs are well-aerated. Vascular congestion is noted. No pleural effusion or pneumothorax is seen.  The cardiomediastinal silhouette is borderline normal in size. No acute osseous abnormalities are seen. There is chronic deformity involving left-sided ribs. Chronic degenerative change is noted at the glenohumeral joints bilaterally.  IMPRESSION: Vascular congestion noted. Lungs remain grossly clear. No acute displaced rib fracture seen.   Electronically Signed   By: Garald Balding M.D.   On: 03/16/2015 05:05   Dg Pelvis 1-2 Views  03/16/2015   CLINICAL DATA:  Status post fall, with altered mental status. Initial encounter.  EXAM: PELVIS - 1-2 VIEW  COMPARISON:  Abdominal radiograph performed 10/01/2009  FINDINGS: There is no evidence of fracture or dislocation. Bilateral hip hemiarthroplasties are grossly unremarkable, though incompletely imaged. Mild degenerative change is noted at the lower lumbar spine. The sacroiliac joints are unremarkable in appearance.  The visualized bowel gas pattern is grossly unremarkable in appearance.  IMPRESSION: No evidence of fracture or dislocation. Visualized hardware appears grossly intact.   Electronically Signed   By: Garald Balding M.D.   On: 03/16/2015 05:07   Ct Head Wo Contrast  03/16/2015   CLINICAL DATA:  Found on floor; unwitnessed fall. Concern for head or cervical spine injury. Initial encounter.  EXAM: CT HEAD WITHOUT CONTRAST  CT CERVICAL SPINE WITHOUT CONTRAST  TECHNIQUE: Multidetector CT imaging of the head and cervical spine was performed following the standard protocol without intravenous contrast. Multiplanar CT image reconstructions of the cervical spine were also generated.  COMPARISON:  CT of the cervical spine performed 01/30/2013, MRI of the brain performed 01/31/2013, and CT of the head performed 03/15/2015  FINDINGS: CT HEAD FINDINGS  There is no evidence of acute infarction, mass lesion, or intra- or extra-axial hemorrhage on CT.  Prominence of the ventricles and sulci reflects moderate cortical volume loss. Cerebellar atrophy is noted. 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 Scattered periventricular and subcortical white matter change likely reflects small vessel ischemic microangiopathy.  The brainstem and fourth ventricle are within normal limits. The basal ganglia are unremarkable in appearance. The cerebral hemispheres demonstrate grossly normal gray-white differentiation. No  mass effect or midline shift is seen.  There is no evidence of fracture; visualized osseous structures are unremarkable in appearance. The orbits are within normal limits. Mild mucosal thickening is noted at the sphenoid sinus. The remaining paranasal sinuses and mastoid air cells are well-aerated. No significant soft tissue abnormalities are seen.  CT CERVICAL SPINE FINDINGS  There is no evidence of fracture or subluxation. Vertebral bodies demonstrate normal height and alignment. Minimal disc space narrowing is noted along the lower cervical spine, with scattered anterior and posterior disc osteophyte complexes. Prevertebral soft tissues are within normal limits.  The visualized portions of the thyroid gland are unremarkable in appearance. The visualized lung apices are clear. Scattered calcification is noted at the carotid bifurcations bilaterally.  IMPRESSION: 1. No evidence of traumatic intracranial injury or fracture. 2. No evidence of fracture or subluxation along the cervical spine. 3. Moderate cortical volume loss and scattered small vessel ischemic microangiopathy. 4. 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 5. Mild mucosal thickening at the sphenoid sinus. 6. Minimal degenerative change along the lower cervical spine. 7. Scattered calcification at the carotid bifurcations bilaterally. Carotid ultrasound would be helpful for further evaluation, when and as deemed clinically appropriate.   Electronically Signed   By: Garald Balding M.D.   On: 03/16/2015 05:16   Ct Head Wo Contrast  03/15/2015   CLINICAL DATA:  Unwitnessed fall, found on floor.  EXAM: CT HEAD WITHOUT CONTRAST  TECHNIQUE: Contiguous axial images were obtained from the base of the skull through the vertex without intravenous contrast.  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  Mild chronic small vessel ischemia. No intracranial hemorrhage, mass effect, or midline shift. No hydrocephalus. The basilar cisterns are patent. No evidence of territorial infarct. No intracranial fluid collection. Calvarium is intact. Included paranasal sinuses and mastoid air cells are well aerated.  IMPRESSION: 1.  No acute intracranial  abnormality. 2. 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   Electronically Signed   By: Jeb Levering M.D.   On: 03/15/2015 00:46   Ct Cervical Spine Wo Contrast  03/16/2015   CLINICAL DATA:  Found on floor; unwitnessed fall. Concern for head or cervical spine injury. Initial encounter.  EXAM: CT HEAD WITHOUT CONTRAST  CT CERVICAL SPINE WITHOUT CONTRAST  TECHNIQUE: Multidetector CT imaging of the head and cervical spine was performed following the standard protocol without intravenous contrast. Multiplanar CT image reconstructions of the cervical spine were also generated.  COMPARISON:  CT of the cervical spine performed 01/30/2013, MRI of the brain performed 01/31/2013, and CT of the head performed 03/15/2015  FINDINGS: CT HEAD FINDINGS  There is no evidence of acute infarction, mass lesion, or intra- or extra-axial hemorrhage on CT.  Prominence of the ventricles and sulci reflects moderate cortical volume loss. Cerebellar atrophy is noted. 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 Scattered periventricular and subcortical white matter change likely reflects small vessel ischemic microangiopathy.  The brainstem and fourth ventricle are within normal limits. The basal ganglia are unremarkable in appearance. The cerebral hemispheres demonstrate grossly normal gray-white differentiation. No mass effect or midline shift is seen.  There is no evidence of fracture; visualized osseous structures are unremarkable in appearance. The orbits are within normal limits. Mild  mucosal thickening is noted at the sphenoid sinus. The remaining paranasal sinuses and mastoid air cells are well-aerated. No significant soft tissue abnormalities are seen.  CT CERVICAL SPINE FINDINGS  There is no evidence of fracture or subluxation. Vertebral bodies demonstrate normal height and alignment. Minimal disc space narrowing is noted along the lower cervical spine, with scattered anterior and posterior disc osteophyte complexes. Prevertebral soft tissues are within normal limits.  The visualized portions of the thyroid gland are unremarkable in appearance. The visualized lung apices are clear. Scattered calcification is noted at the carotid bifurcations bilaterally.  IMPRESSION: 1. No evidence of traumatic intracranial injury or fracture. 2. No evidence of fracture or subluxation along the cervical spine. 3. Moderate cortical volume loss and scattered small vessel ischemic microangiopathy. 4. 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 5. Mild mucosal thickening at the sphenoid sinus. 6. Minimal degenerative change along the lower cervical spine. 7. Scattered calcification at the carotid bifurcations bilaterally. Carotid ultrasound would be helpful for further evaluation, when and as deemed clinically appropriate.   Electronically Signed   By: Garald Balding M.D.   On: 03/16/2015 05:16     EKG Interpretation   Date/Time:  Tuesday March 16 2015 03:26:37 EDT Ventricular Rate:  86 PR Interval:  171 QRS Duration: 110 QT Interval:  385 QTC Calculation: 460 R Axis:   -48 Text Interpretation:  Sinus rhythm Ventricular premature complex Aberrant  conduction of SV complex(es) Anteroseptal infarct, age indeterminate  Confirmed by DELOS  MD, Antionetta Ator ([id number]) on 03/16/2015 4:36:17 AM      MDM   Final diagnoses:  Fall    Patient is a 80 year old male with extensive past medical history brought for evaluation for the third time this week for fall. He has  multiple contusions and abrasions in multiple parts of his body, but x-rays and CT scans failed reveal any broken bones. His head CT is negative for bleed or CVA. His laboratory studies are essentially unremarkable but temp is 101.6. There is no evidence for pneumonia or urinary tract infection and  blood cultures are pending to rule out sepsis as a possible cause to his instability.  I've spoken with Dr. Posey Pronto from the hospitalist service who agrees to admit for observation. Patient is on multiple medications which could potentially make him unsteady.    Veryl Speak, MD 03/16/15 [phone]

## 2015-03-16 NOTE — ED Notes (Signed)
Foley securing strap applied to left leg

## 2015-03-16 NOTE — ED Notes (Signed)
MD at bedside. 

## 2015-03-16 NOTE — ED Notes (Signed)
Pt CBG, 125. Nurse was notified.

## 2015-03-16 NOTE — ED Notes (Signed)
Per [facility]: pt found on floor at facility, pt does not remember falling, unwitnessed fall. Pt c/o left shoulder pain, no crepitus or deformity noted, and lower back pain, no crepitus or deformity noted. Pt has been falling frequently throughout the past week.

## 2015-03-16 NOTE — Progress Notes (Signed)
Pt is tachycardic >140's, non-sustaining.  Karolee Stamps, PA notified.

## 2015-03-16 NOTE — Progress Notes (Signed)
INITIAL NUTRITION ASSESSMENT  DOCUMENTATION CODES Per approved criteria  -Non-severe (moderate) malnutrition in the context of chronic illness  Pt meets criteria for MODERATE (NON-SEVERE) MALNUTRITION in the context of CHRONIC ILLNESS as evidenced by mild fat wasting and moderate muscle wasting.  INTERVENTION: Provide Ensure Complete TID in between meals, each supplement provides 350 kcal and 20 grams of protein Encourage PO intake  NUTRITION DIAGNOSIS: Inadequate oral intake related to limited food preferences as evidenced by pt's report and <20% meal completion.   Goal: Pt to meet >/= 90% of their estimated nutrition needs   Monitor:  PO intake, weight trend, labs  Reason for Assessment: Consult for Assessment of nutrition status/requirements  80 y.o. male  Admitting Dx: Fever  ASSESSMENT: 80 y.o. male, from SNF with a pmh of Parkinson's Disease , Bipolar disorder, DMII, HTN, and empyema (2013). He was sent to the ER for multiple falls. Reportedly he has had 5 in the past week.  Pt unable to provide much history. He states he doesn't like the food here but, breakfast is usually good. Breakfast tray at bedside with less than 20% consumed. He denies any nausea or abdominal pain. He reports drinking Ensure supplements  PTA twice daily. Pt declined snacks but is agreeable to Ensure Enlive.   Labs reviewed.   Nutrition Focused Physical Exam:  Subcutaneous Fat:  Orbital Region: WNL Upper Arm Region: mild wasting Thoracic and Lumbar Region: NA  Muscle:  Temple Region: mild wasting Clavicle Bone Region: moderate wasting Clavicle and Acromion Bone Region: moderate wasting Scapular Bone Region: NA Dorsal Hand: mild wasting Patellar Region: moderate wasting Anterior Thigh Region: moderate wasting Posterior Calf Region: NA  Edema: none   Height: Ht Readings from Last 1 Encounters:  03/14/15 5' 10.5" (1.791 m)    Weight: Wt Readings from Last 1 Encounters:   03/14/15 165 lb (74.844 kg)    Ideal Body Weight: 172 lbs  % Ideal Body Weight: 96%  Wt Readings from Last 10 Encounters:  03/14/15 165 lb (74.844 kg)  12/02/14 165 lb 3.2 oz (74.934 kg)  10/01/14 160 lb 9.6 oz (72.848 kg)  08/21/14 164 lb (74.39 kg)  08/11/14 163 lb 12.8 oz (74.299 kg)  07/01/14 165 lb (74.844 kg)  04/19/14 165 lb 12.8 oz (75.206 kg)  04/02/14 167 lb (75.751 kg)  03/31/14 164 lb 9.6 oz (74.662 kg)  12/30/13 162 lb 3.2 oz (73.573 kg)    Usual Body Weight: 165 lbs  % Usual Body Weight: 100%  BMI:  Body Mass Index of 23.3 kg/(m^2)  Estimated Nutritional Needs: Kcal: 1800-2000 Protein: 85-100 grams Fluid: 2 L/day  Skin: red area on buttocks  Diet Order: Diet Carb Modified Fluid consistency:: Thin; Room service appropriate?: Yes  EDUCATION NEEDS: -No education needs identified at this time   Intake/Output Summary (Last 24 hours) at 03/16/15 1048 Last data filed at 03/16/15 0417  Gross per 24 hour  Intake      0 ml  Output    300 ml  Net   -300 ml    Last BM: 3/28  Labs:   Recent Labs Lab 03/16/15 0403  NA 137  K 3.7  CL 105  CO2 21  BUN 30*  CREATININE 1.33  CALCIUM 8.5  GLUCOSE 115*    CBG (last 3)   Recent Labs  03/16/15 0341  GLUCAP 125*    Scheduled Meds: . sodium chloride   Intravenous STAT  . amLODipine  5 mg Oral Daily  . aspirin  81  mg Oral Daily  . carbidopa-levodopa  1 tablet Oral TID  . clonazePAM  0.25 mg Oral BID  . docusate sodium  100 mg Oral BID  . entacapone  200 mg Oral TID  . feeding supplement (GLUCERNA SHAKE)  237 mL Oral Daily  . fluticasone  2 spray Each Nare Daily  . fosfomycin  3 g Oral Once  . furosemide  20 mg Intravenous Once  . guaiFENesin  600 mg Oral BID  . hydrALAZINE  25 mg Oral TID  . hydrocerin  1 application Topical BID  . insulin aspart  0-9 Units Subcutaneous TID WC  . ipratropium-albuterol  3 mL Nebulization Q6H  . lamoTRIgine  150 mg Oral BID  . lisinopril  10 mg Oral  Daily  . pantoprazole  40 mg Oral Q1200  . polyethylene glycol  17 g Oral Daily  . QUEtiapine  50 mg Oral QHS  . sertraline  200 mg Oral Daily  . sodium chloride  3 mL Intravenous Q12H  . traZODone  100 mg Oral QHS    Continuous Infusions:   Past Medical History  Diagnosis Date  . Hearing aid worn   . Hypertension   . Parkinson disease   . Pneumonia   . Diabetes mellitus type II   . Arthritis   . Bipolar disorder   . Anxiety   . Depression   . MRSA infection (methicillin-resistant Staphylococcus aureus)   . Chronic indwelling Foley catheter   . C2 cervical fracture     due to pt fall  . SIRS (systemic inflammatory response syndrome)   . Neuromuscular disorder   . Memory loss   . Benign prostate hyperplasia   . Memory difficulty 08/21/2014    Past Surgical History  Procedure Laterality Date  . Total hip arthroplasty    . Video bronchoscopy  12/16/2012    Procedure: VIDEO BRONCHOSCOPY;  Surgeon: Melrose Nakayama, MD;  Location: [place];  Service: Thoracic;  Laterality: Right;  . Video assisted thoracoscopy (vats)/empyema  12/16/2012    Procedure: VIDEO ASSISTED THORACOSCOPY (VATS)/EMPYEMA;  Surgeon: Melrose Nakayama, MD;  Location: [place];  Service: Thoracic;  Laterality: Right;    Pryor Ochoa RD, LDN Inpatient Clinical Dietitian Pager: [phone] After Hours Pager: [phone]

## 2015-03-16 NOTE — ED Notes (Signed)
EKG given to Dr. Delo 

## 2015-03-16 NOTE — ED Notes (Signed)
Post ED Visit - Positive Culture Follow-up  Culture report reviewed by antimicrobial stewardship pharmacist: []  Wes Casco, Pharm.D., BCPS [x]  Heide Guile, Pharm.D., BCPS []  Alycia Rossetti, Pharm.D., BCPS []  [place], Pharm.D., BCPS, AAHIVP []  Legrand Como, Pharm.D., BCPS, AAHIVP []  Isac Sarna, Pharm.D., BCPS  Positive urine culture Treated with bactrim DS, organism sensitive to the same and no further patient follow-up is required at this time.  Ileene Musa 03/16/2015, 9:54 AM

## 2015-03-16 NOTE — Progress Notes (Signed)
UR completed 

## 2015-03-16 NOTE — Evaluation (Signed)
Physical Therapy Evaluation Patient Details Name: [name] MRN: [id number] DOB: 09-Jun-1935 Today's Date: 03/16/2015   History of Present Illness  Patient is a 80 year old male with extensive past medical history including type 2 diabetes, Parkinson's disease, hypertension, bipolar, CHF, empyema with thoracotomy. He is brought by EMS for evaluation of a fall. Patient is a resident of [facility]. He is apparently suffered multiple falls this week. He was seen here yesterday for similar injury. Head CT at that time was negative and he was returned home. He returns after yet another fall which may be his fourth and possibly fifth fall in the past week.  Clinical Impression  Pt admitted with above diagnosis. Pt currently with functional limitations due to the deficits listed below (see PT Problem List).  Pt will benefit from skilled PT to increase their independence and safety with mobility to allow discharge to the venue listed below.  Evaluation was limited by pt's lethargy.  He would wake up and answer questions although seemed agitated about answering them.  Pt able to initiate rolling, but MAX A to complete.  Too lethargic to sit EOB and would also recommend +2 for OOB. Recommend SNF at time of d/c.    Follow Up Recommendations SNF    Equipment Recommendations  None recommended by PT    Recommendations for Other Services       Precautions / Restrictions Precautions Precautions: Fall Precaution Comments: 4-5 falls in past week Restrictions Weight Bearing Restrictions: No      Mobility  Bed Mobility Overal bed mobility: Needs Assistance Bed Mobility: Rolling Rolling: Max assist         General bed mobility comments: Pt attempting to initiate roll with bringing arm over, but required hand over hand technique to reach and then A for roll.  Pt agreeable to attempt to sit EOB, but then fell asleep again and so deferred.  After rolling, positioned pt in semi-L  sidelying with heels floating and pillow between knees.  Transfers                 General transfer comment: Unable to assess transfers due to lethargy.  Ambulation/Gait                Stairs            Wheelchair Mobility    Modified Rankin (Stroke Patients Only)       Balance                                             Pertinent Vitals/Pain Pain Assessment: No/denies pain    Home Living Family/patient expects to be discharged to:: Assisted living               Home Equipment: Wheelchair - manual Additional Comments: Pt lethargic and agitated with questions when he did wake up enough to answer.    Prior Function Level of Independence: Needs assistance   Gait / Transfers Assistance Needed: Pt self-reports he is I with transfers in to w/c, but has also had frequent falls with possibly 5 in the last week           Hand Dominance        Extremity/Trunk Assessment   Upper Extremity Assessment: Defer to OT evaluation           Lower Extremity Assessment: Generalized weakness;Difficult  to assess due to impaired cognition         Communication      Cognition Arousal/Alertness: Lethargic Behavior During Therapy: Agitated Overall Cognitive Status: No family/caregiver present to determine baseline cognitive functioning                      General Comments General comments (skin integrity, edema, etc.): Pt with scabs on forehead, B lateral portions of knees/ tibial plateau, and sacral wound.     Exercises        Assessment/Plan    PT Assessment Patient needs continued PT services  PT Diagnosis Generalized weakness   PT Problem List Decreased strength;Decreased range of motion;Decreased activity tolerance;Decreased mobility  PT Treatment Interventions Functional mobility training;Therapeutic activities;Therapeutic exercise;Wheelchair mobility training;Patient/family education;Balance training   PT  Goals (Current goals can be found in the Care Plan section) Acute Rehab PT Goals PT Goal Formulation: Patient unable to participate in goal setting Time For Goal Achievement: 03/30/15 Potential to Achieve Goals: Fair    Frequency Min 3X/week   Barriers to discharge        Co-evaluation               End of Session Equipment Utilized During Treatment: Oxygen Activity Tolerance: Patient limited by lethargy Patient left: in bed Nurse Communication: Mobility status    Functional Limitation: Changing and maintaining body position Changing and Maintaining Body Position Current Status (O7096): At least 60 percent but less than 80 percent impaired, limited or restricted Changing and Maintaining Body Position Goal Status (G8366): At least 20 percent but less than 40 percent impaired, limited or restricted    Time: 1012-1035 PT Time Calculation (min) (ACUTE ONLY): 23 min   Charges:   PT Evaluation $Initial PT Evaluation Tier I: 1 Procedure PT Treatments $Therapeutic Activity: 8-22 mins   PT G Codes:   PT G-Codes **NOT FOR INPATIENT CLASS** Functional Limitation: Changing and maintaining body position Changing and Maintaining Body Position Current Status (Q9476): At least 60 percent but less than 80 percent impaired, limited or restricted Changing and Maintaining Body Position Goal Status (L4650): At least 20 percent but less than 40 percent impaired, limited or restricted    [facility] LUBECK 03/16/2015, 10:44 AM

## 2015-03-16 NOTE — H&P (Signed)
[facility] History and Physical                                                                                    [name], is a 80 y.o. male  MRN: [id number]   DOB - [birth year]  Admit Date - 03/16/2015  Outpatient Primary MD for the patient is No primary care provider on file.  With History of -  Past Medical History  Diagnosis Date  . Hearing aid worn   . Hypertension   . Parkinson disease   . Pneumonia   . Diabetes mellitus type II   . Arthritis   . Bipolar disorder   . Anxiety   . Depression   . MRSA infection (methicillin-resistant Staphylococcus aureus)   . Chronic indwelling Foley catheter   . C2 cervical fracture     due to pt fall  . SIRS (systemic inflammatory response syndrome)   . Neuromuscular disorder   . Memory loss   . Benign prostate hyperplasia   . Memory difficulty 08/21/2014      Past Surgical History  Procedure Laterality Date  . Total hip arthroplasty    . Video bronchoscopy  12/16/2012    Procedure: VIDEO BRONCHOSCOPY;  Surgeon: Melrose Nakayama, MD;  Location: [facility];  Service: Thoracic;  Laterality: Right;  . Video assisted thoracoscopy (vats)/empyema  12/16/2012    Procedure: VIDEO ASSISTED THORACOSCOPY (VATS)/EMPYEMA;  Surgeon: Melrose Nakayama, MD;  Location: [facility];  Service: Thoracic;  Laterality: Right;    in for   Chief Complaint  Patient presents with  . Altered Mental Status  . Fall     HPI  [name]  is a 80 y.o. male, from SNF with a pmh of Parkinson's Disease (Dr. Jannifer Franklin, [facility]), Bipolar disorder (Dr. Adele Schilder), DMII, HTN, and empyema (2013).  He was sent to the ER for multiple falls.  Reportedly he has had 5 in the past week.  History was gathered from the EDP and the records as the patient is an unreliable historian.  He had a recent UTI, culture grew klebsiella pneumoniae, for which he was placed on macrobid.  He normally has a chronic foley, but pulled it out recently.  His temperature in the ER  this morning is 101.6.  CT head and cervical spine are fortunately unrevealing.  Xray of his pelvis is negative for fracture and his hip arthroplasty is in tact.  CXR shows pulmonary vascular congestion and the patient does have a cough and mild expiratory wheeze.  He has numerous scraps and bruises on his face, back, trunk, arms and legs.  He has a pressure ulceration on is sacral area.  His last visit with Dr. Jannifer Franklin was in 08/2014.  He was stable at the time.  He is on the same Parkinson's medications then.  He is a month past due for a follow up appt.  His last visit with Dr. Adele Schilder was 11/2014.  Again he is on the same psychotropic medications, and while he is taking multiple medications that would affect his balance and mental status, they seem very appropriate and I do not suspect they are causing his falls.  We  will admit Mr. [name] for fever, and attempt to determine why his condition has worsened acutely.  I believe he will need close outpatient follow up with neurology.    Review of Systems   In addition to the HPI above,  Mr. [name] is a very poor historian.  He denies any complaints regarding CP, HA, SOB, AP, changes in bowel habits or urination.  He is unable to tell me where he is right now or why he came here.  Social History History  Substance Use Topics  . Smoking status: Never Smoker   . Smokeless tobacco: Never Used  . Alcohol Use: No     Comment: occasional    Family History Family History  Problem Relation Age of Onset  . Depression Father   . Cancer Mother   . Heart disease Mother     Prior to Admission medications   Medication Sig Start Date End Date Taking? Authorizing Provider  acetaminophen (TYLENOL) 325 MG tablet Take 650 mg by mouth every 6 (six) hours as needed for mild pain.   Yes Historical Provider, MD  albuterol (PROVENTIL) (2.5 MG/3ML) 0.083% nebulizer solution Take 3 mLs (2.5 mg total) by nebulization every 2 (two) hours as needed for wheezing or  shortness of breath. 04/21/14  Yes Hosie Poisson, MD  amLODipine (NORVASC) 5 MG tablet Take 1 tablet (5 mg total) by mouth daily. 02/03/13  Yes Janece Canterbury, MD  aspirin 81 MG chewable tablet Chew 81 mg by mouth daily.   Yes Historical Provider, MD  bisacodyl (DULCOLAX) 10 MG suppository Place 10 mg rectally daily as needed for moderate constipation.   Yes Historical Provider, MD  carbidopa-levodopa (SINEMET IR) 25-250 MG per tablet Take 1 tablet by mouth 3 (three) times daily.   Yes Historical Provider, MD  clonazePAM (KLONOPIN) 0.5 MG tablet Take 1/2 tab at noon and 1 tab at bed time Patient taking differently: Take 0.25-0.5 mg by mouth 2 (two) times daily. Take 1/2 tab at noon and 1 tab at bed time 12/02/14  Yes Kathlee Nations, MD  entacapone (COMTAN) 200 MG tablet Take 200 mg by mouth 3 (three) times daily.   Yes Historical Provider, MD  fluticasone (FLONASE) 50 MCG/ACT nasal spray Place 2 sprays into both nostrils daily.   Yes Historical Provider, MD  GLUCERNA (GLUCERNA) LIQD Take 237 mLs by mouth daily.    Yes Historical Provider, MD  guaiFENesin (MUCINEX) 600 MG 12 hr tablet Take 1 tablet (600 mg total) by mouth 2 (two) times daily. 04/21/14  Yes Hosie Poisson, MD  hydrALAZINE (APRESOLINE) 25 MG tablet Take 25 mg by mouth 3 (three) times daily.   Yes Historical Provider, MD  hydrocortisone 2.5 % cream Apply 1 application topically as needed (for rash).   Yes Historical Provider, MD  ipratropium-albuterol (DUONEB) 0.5-2.5 (3) MG/3ML SOLN Take 3 mLs by nebulization 3 (three) times daily. Patient taking differently: Take 3 mLs by nebulization as needed (for breathing).  04/21/14  Yes Hosie Poisson, MD  lamoTRIgine (LAMICTAL) 150 MG tablet Take 1 tablet (150 mg total) by mouth 2 (two) times daily. 12/02/14  Yes Kathlee Nations, MD  lisinopril (PRINIVIL,ZESTRIL) 10 MG tablet Take 1 tablet (10 mg total) by mouth daily. 02/03/13  Yes Janece Canterbury, MD  loratadine (CLARITIN) 10 MG tablet Take 10 mg by mouth  daily as needed for allergies.   Yes Historical Provider, MD  mineral oil-hydrophilic petrolatum (AQUAPHOR) ointment Apply 1 application topically 2 (two) times daily as needed for dry  skin. For dry skin on heels   Yes Historical Provider, MD  Multiple Vitamin (MULTIVITAMIN WITH MINERALS) TABS tablet Take 1 tablet by mouth daily.   Yes Historical Provider, MD  nitrofurantoin, macrocrystal-monohydrate, (MACROBID) 100 MG capsule Take 1 capsule (100 mg total) by mouth 2 (two) times daily. 03/12/15  Yes Quintella Reichert, MD  pantoprazole (PROTONIX) 40 MG tablet Take 1 tablet (40 mg total) by mouth daily at 12 noon. 04/21/14  Yes Hosie Poisson, MD  polyethylene glycol (MIRALAX / GLYCOLAX) packet Take 17 g by mouth daily.   Yes Historical Provider, MD  QUEtiapine (SEROQUEL) 50 MG tablet Take 1 tablet (50 mg total) by mouth at bedtime. 12/02/14  Yes Kathlee Nations, MD  Red Yeast Rice Extract (RED YEAST RICE PO) Take 2 tablets by mouth at bedtime.    Yes Historical Provider, MD  sertraline (ZOLOFT) 100 MG tablet Take 2 tablets (200 mg total) by mouth daily. 10/01/14  Yes Kathlee Nations, MD  Skin Protectants, Misc. (DIMETHICONE-ZINC OXIDE) cream Apply topically 2 (two) times daily as needed for dry skin.   Yes Historical Provider, MD  Skin Protectants, Misc. (EUCERIN) cream Apply 1 application topically 2 (two) times daily.   Yes Historical Provider, MD  traZODone (DESYREL) 100 MG tablet Take 1 tablet (100 mg total) by mouth at bedtime. 12/02/14  Yes Kathlee Nations, MD  Valerian Root 530 MG CAPS Take 1 capsule by mouth daily.   Yes Historical Provider, MD    Allergies  Allergen Reactions  . Cholestatin Other (See Comments)    Per [facility]    Physical Exam  Vitals  Filed Vitals:   03/16/15 0724 03/16/15 0725 03/16/15 0725 03/16/15 0800  BP:    131/68  Pulse: 63 83  64  Temp:   99.1 F (37.3 C) 98.6 F (37 C)  TempSrc:   Rectal Axillary  Resp: 20 21  18   SpO2: 98% 98%  99%     General: Thin, well  developed male, lying in bed in NAD.  Mildly irritable.  Psych:  Alert, orientated to person, frustrated that he is unable to answer my questions.  ENT:  Ears and Eyes appear Normal, Conjunctivae clear, PER. Moist oral mucosa without erythema or exudates.  Neck:  Supple, No lymphadenopathy appreciated  Respiratory:  Non productive cough, some expiratory wheeze.  Cardiac:  RRR, No Murmurs, no LE edema noted, no JVD.    Abdomen:  Positive bowel sounds, Soft, Non tender, Non distended,  No masses appreciated  Skin:  Multiple scabbed scraps on his face, arms and legs, areas of bruising and scraps on his ribs, abdomen and central back.   I did not examine his sacral decub.  Extremities:  Able to move all 4. 5/5 strength in each  Data Review  CBC  Recent Labs Lab 03/16/15 0403  WBC 4.0  HGB 13.1  HCT 39.3  PLT 125*  MCV 89.5  MCH 29.8  MCHC 33.3  RDW 14.4    Chemistries   Recent Labs Lab 03/16/15 0403  NA 137  K 3.7  CL 105  CO2 21  GLUCOSE 115*  BUN 30*  CREATININE 1.33  CALCIUM 8.5  AST 54*  ALT 22  ALKPHOS 64  BILITOT 0.7     Urinalysis    Component Value Date/Time   COLORURINE YELLOW 03/16/2015 0403   APPEARANCEUR CLEAR 03/16/2015 0403   LABSPEC 1.025 03/16/2015 0403   PHURINE 5.5 03/16/2015 0403   GLUCOSEU NEGATIVE 03/16/2015 0403  HGBUR SMALL* 03/16/2015 0403   BILIRUBINUR NEGATIVE 03/16/2015 0403   KETONESUR 15* 03/16/2015 0403   PROTEINUR 30* 03/16/2015 0403   UROBILINOGEN 1.0 03/16/2015 0403   NITRITE NEGATIVE 03/16/2015 0403   LEUKOCYTESUR SMALL* 03/16/2015 0403    Imaging results:   Dg Chest 1 View  03/16/2015   CLINICAL DATA:  Status post fall, with altered mental status. Initial encounter.  EXAM: CHEST  1 VIEW  COMPARISON:  Chest radiograph performed 03/12/2015  FINDINGS: The lungs are well-aerated. Vascular congestion is noted. No pleural effusion or pneumothorax is seen.  The cardiomediastinal silhouette is borderline normal in  size. No acute osseous abnormalities are seen. There is chronic deformity involving left-sided ribs. Chronic degenerative change is noted at the glenohumeral joints bilaterally.  IMPRESSION: Vascular congestion noted. Lungs remain grossly clear. No acute displaced rib fracture seen.   Electronically Signed   By: Garald Balding M.D.   On: 03/16/2015 05:05   Dg Chest 2 View  03/12/2015   CLINICAL DATA:  Fever, congestion, hypertension, type to diabetes, Parkinson's  EXAM: CHEST  2 VIEW  COMPARISON:  09/21/2014 ; correlation CT chest 04/18/2014  FINDINGS: Enlargement of cardiac silhouette.  Calcification and elongation of thoracic aorta.  Mediastinal contours and pulmonary vascularity normal.  Minimal elevation RIGHT diaphragm.  Persistent nodular density at minor fissure 20 x 12 mm corresponding to chronic atelectasis, loculated pleural fluid or pleural thickening by prior CT.  No acute infiltrate, pleural effusion or pneumothorax.  Multiple old LEFT rib fractures.  Advanced BILATERAL glenohumeral degenerative changes.  IMPRESSION: Enlargement of cardiac silhouette.  Chronic nodular opacity at the minor fissure unchanged since May 2015.  No acute abnormalities.   Electronically Signed   By: Lavonia Dana M.D.   On: 03/12/2015 08:40   Dg Pelvis 1-2 Views  03/16/2015   CLINICAL DATA:  Status post fall, with altered mental status. Initial encounter.  EXAM: PELVIS - 1-2 VIEW  COMPARISON:  Abdominal radiograph performed 10/01/2009  FINDINGS: There is no evidence of fracture or dislocation. Bilateral hip hemiarthroplasties are grossly unremarkable, though incompletely imaged. Mild degenerative change is noted at the lower lumbar spine. The sacroiliac joints are unremarkable in appearance.  The visualized bowel gas pattern is grossly unremarkable in appearance.  IMPRESSION: No evidence of fracture or dislocation. Visualized hardware appears grossly intact.   Electronically Signed   By: Garald Balding M.D.   On:  03/16/2015 05:07   Ct Head Wo Contrast  03/16/2015   CLINICAL DATA:  Found on floor; unwitnessed fall. Concern for head or cervical spine injury. Initial encounter.  EXAM: CT HEAD WITHOUT CONTRAST  CT CERVICAL SPINE WITHOUT CONTRAST  TECHNIQUE: Multidetector CT imaging of the head and cervical spine was performed following the standard protocol without intravenous contrast. Multiplanar CT image reconstructions of the cervical spine were also generated.  COMPARISON:  CT of the cervical spine performed 01/30/2013, MRI of the brain performed 01/31/2013, and CT of the head performed 03/15/2015  FINDINGS: CT HEAD FINDINGS  There is no evidence of acute infarction, mass lesion, or intra- or extra-axial hemorrhage on CT.  Prominence of the ventricles and sulci reflects moderate cortical volume loss. Cerebellar atrophy is noted. 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 Scattered periventricular and subcortical white matter change likely reflects small vessel ischemic microangiopathy.  The brainstem and fourth ventricle are within normal limits. The basal ganglia are unremarkable in appearance. The cerebral hemispheres demonstrate grossly normal gray-white differentiation. No mass effect or midline  shift is seen.  There is no evidence of fracture; visualized osseous structures are unremarkable in appearance. The orbits are within normal limits. Mild mucosal thickening is noted at the sphenoid sinus. The remaining paranasal sinuses and mastoid air cells are well-aerated. No significant soft tissue abnormalities are seen.  CT CERVICAL SPINE FINDINGS  There is no evidence of fracture or subluxation. Vertebral bodies demonstrate normal height and alignment. Minimal disc space narrowing is noted along the lower cervical spine, with scattered anterior and posterior disc osteophyte complexes. Prevertebral soft tissues are within normal limits.  The visualized portions of the thyroid gland are  unremarkable in appearance. The visualized lung apices are clear. Scattered calcification is noted at the carotid bifurcations bilaterally.  IMPRESSION: 1. No evidence of traumatic intracranial injury or fracture. 2. No evidence of fracture or subluxation along the cervical spine. 3. Moderate cortical volume loss and scattered small vessel ischemic microangiopathy. 4. 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 5. Mild mucosal thickening at the sphenoid sinus. 6. Minimal degenerative change along the lower cervical spine. 7. Scattered calcification at the carotid bifurcations bilaterally. Carotid ultrasound would be helpful for further evaluation, when and as deemed clinically appropriate.   Electronically Signed   By: Garald Balding M.D.   On: 03/16/2015 05:16   Ct Head Wo Contrast  03/15/2015   CLINICAL DATA:  Unwitnessed fall, found on floor.  EXAM: CT HEAD WITHOUT CONTRAST  TECHNIQUE: Contiguous axial images were obtained from the base of the skull through the vertex without intravenous contrast.  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 Mild chronic small vessel ischemia. No intracranial hemorrhage, mass effect, or midline shift. No hydrocephalus. The basilar cisterns are patent. No evidence of territorial infarct. No intracranial fluid collection. Calvarium is intact. Included paranasal sinuses and mastoid air cells are well aerated.  IMPRESSION: 1.  No acute intracranial abnormality. 2. 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   Electronically Signed   By: Jeb Levering M.D.   On: 03/15/2015 00:46   Ct Cervical Spine Wo Contrast  03/16/2015   CLINICAL DATA:  Found on floor; unwitnessed fall. Concern for head or cervical spine injury. Initial encounter.  EXAM: CT HEAD  WITHOUT CONTRAST  CT CERVICAL SPINE WITHOUT CONTRAST  TECHNIQUE: Multidetector CT imaging of the head and cervical spine was performed following the standard protocol without intravenous contrast. Multiplanar CT image reconstructions of the cervical spine were also generated.  COMPARISON:  CT of the cervical spine performed 01/30/2013, MRI of the brain performed 01/31/2013, and CT of the head performed 03/15/2015  FINDINGS: CT HEAD FINDINGS  There is no evidence of acute infarction, mass lesion, or intra- or extra-axial hemorrhage on CT.  Prominence of the ventricles and sulci reflects moderate cortical volume loss. Cerebellar atrophy is noted. 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 Scattered periventricular and subcortical white matter change likely reflects small vessel ischemic microangiopathy.  The brainstem and fourth ventricle are within normal limits. The basal ganglia are unremarkable in appearance. The cerebral hemispheres demonstrate grossly normal gray-white differentiation. No mass effect or midline shift is seen.  There is no evidence of fracture; visualized osseous structures are unremarkable in appearance. The orbits are within normal limits. Mild mucosal thickening is noted at the sphenoid sinus. The remaining paranasal sinuses and mastoid air cells are  well-aerated. No significant soft tissue abnormalities are seen.  CT CERVICAL SPINE FINDINGS  There is no evidence of fracture or subluxation. Vertebral bodies demonstrate normal height and alignment. Minimal disc space narrowing is noted along the lower cervical spine, with scattered anterior and posterior disc osteophyte complexes. Prevertebral soft tissues are within normal limits.  The visualized portions of the thyroid gland are unremarkable in appearance. The visualized lung apices are clear. Scattered calcification is noted at the carotid bifurcations bilaterally.  IMPRESSION: 1. No evidence of traumatic  intracranial injury or fracture. 2. No evidence of fracture or subluxation along the cervical spine. 3. Moderate cortical volume loss and scattered small vessel ischemic microangiopathy. 4. 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 5. Mild mucosal thickening at the sphenoid sinus. 6. Minimal degenerative change along the lower cervical spine. 7. Scattered calcification at the carotid bifurcations bilaterally. Carotid ultrasound would be helpful for further evaluation, when and as deemed clinically appropriate.   Electronically Signed   By: Garald Balding M.D.   On: 03/16/2015 05:16    My personal review of EKG: NSR, No ST changes noted.   Assessment & Plan  Principal Problem:   Fever Active Problems:   Parkinson disease   Diabetes mellitus, type 2   Bipolar disorder   Neuromuscular disorder   Chronic diastolic congestive heart failure   Memory difficulty   Fall   Sacral pressure ulcer   80 yo male with parkinsons, dementia, bipolar do, diabetes, brought to the ER for 5 falls in the past week.  Vascular congestion on CXR.  Temperature 101.6.    Multiple falls in the setting of Parkinsons Disease. This is the 3 visit to the ER for recent falls. Could be a natural progression of his Parkinson's Dz.  Recommend follow up with Dr. Jannifer Franklin ASAP after D/C Could possibly be infection.  Work up as below. Medications may be playing a role, but I doubt it.  (This is more likely if he has lost a significant amount of weight).  Physical & Occupational evaluations are requested.   Orthostatic Vitals.   Addendum: Spoke at length with [name].  Symptoms seem acute.  In the past week the patient has become much more confused.  Normally UTI's effect him this way.  He has had 3 recent ER visits for falls.    Fever. Uncertain etiology.  Likely viral. Will check flu PCR and place on droplet until this returns negative. Recent klebsiella pneumoniae  infection.  Received 3 days of macrobid.  Will finish the course with fosfomycin. Current u/a does not appear infected.  Will culture it. Blood cultures were obtained in the ER.  Please follow.  Pulmonary vascular congestion Noted on Xray.  Patient's last echo in 2015 showed grade 1 diastolic dysfunction. Will give 1 dose of IV lasix.  Monitor I&Os.  Check daily weights. Patient does not appear overloaded (no edema, no JVD, no rales) but please reassess need for lasix 3/30.  Non Productive Cough with mild wheezing. Will continue mucinex, schedule nebs.  Checking for flu.  Sacral decubitus - multiple skin abrasions. WOC requested.  Recommendations appreciated.  Dementia. Reportedly at baseline.  DVT Prophylaxis:  SCDs    AM Labs Ordered, also please review Full Orders  Family Communication:   Sherron Monday Raffield [phone]). - Ex wife and friend.  Code Status:    Condition:    Time spent in minutes : [address],  PA-C on 03/16/2015 at 9:02  AM  Between 7am to 7pm - Pager - [phone]  After 7pm go to [email or web address] - password [email or web address]  And look for the night coverage person covering me after hours  [facility]

## 2015-03-17 ENCOUNTER — Encounter (HOSPITAL_COMMUNITY): Payer: Self-pay | Admitting: General Practice

## 2015-03-17 DIAGNOSIS — G20 Parkinson's disease: Secondary | ICD-10-CM | POA: Diagnosis not present

## 2015-03-17 DIAGNOSIS — R413 Other amnesia: Secondary | ICD-10-CM | POA: Diagnosis not present

## 2015-03-17 DIAGNOSIS — W19XXXA Unspecified fall, initial encounter: Secondary | ICD-10-CM | POA: Diagnosis not present

## 2015-03-17 DIAGNOSIS — I5032 Chronic diastolic (congestive) heart failure: Secondary | ICD-10-CM | POA: Diagnosis not present

## 2015-03-17 LAB — GLUCOSE, CAPILLARY
GLUCOSE-CAPILLARY: 96 mg/dL (ref 70–99)
Glucose-Capillary: 147 mg/dL — ABNORMAL HIGH (ref 70–99)
Glucose-Capillary: 163 mg/dL — ABNORMAL HIGH (ref 70–99)
Glucose-Capillary: 100 mg/dL — ABNORMAL HIGH (ref 70–99)

## 2015-03-17 LAB — CBC
HCT: 36.3 % — ABNORMAL LOW (ref 39.0–52.0)
Hemoglobin: 12 g/dL — ABNORMAL LOW (ref 13.0–17.0)
MCH: 29.9 pg (ref 26.0–34.0)
MCHC: 33.1 g/dL (ref 30.0–36.0)
MCV: 90.5 fL (ref 78.0–100.0)
Platelets: 117 10*3/uL — ABNORMAL LOW (ref 150–400)
RBC: 4.01 MIL/uL — AB (ref 4.22–5.81)
RDW: 14.4 % (ref 11.5–15.5)
WBC: 4.3 10*3/uL (ref 4.0–10.5)

## 2015-03-17 LAB — BASIC METABOLIC PANEL
ANION GAP: 6 (ref 5–15)
BUN: 23 mg/dL (ref 6–23)
CHLORIDE: 103 mmol/L (ref 96–112)
CO2: 29 mmol/L (ref 19–32)
CREATININE: 1.4 mg/dL — AB (ref 0.50–1.35)
Calcium: 8.1 mg/dL — ABNORMAL LOW (ref 8.4–10.5)
GFR calc Af Amer: 54 mL/min — ABNORMAL LOW (ref >90)
GFR, EST NON AFRICAN AMERICAN: 47 mL/min — AB (ref >90)
Glucose, Bld: 93 mg/dL (ref 70–99)
POTASSIUM: 3.6 mmol/L (ref 3.5–5.1)
Sodium: 138 mmol/L (ref 135–145)

## 2015-03-17 LAB — HEMOGLOBIN A1C
Hgb A1c MFr Bld: 5.5 % (ref 4.8–5.6)
Mean Plasma Glucose: 111 mg/dL

## 2015-03-17 MED ORDER — ENSURE ENLIVE PO LIQD
237.0000 mL | Freq: Two times a day (BID) | ORAL | Status: DC
  Administered 2015-03-17 – 2015-03-22 (×10): 237 mL via ORAL

## 2015-03-17 MED ORDER — OSELTAMIVIR PHOSPHATE 30 MG PO CAPS
30.0000 mg | ORAL_CAPSULE | Freq: Two times a day (BID) | ORAL | Status: AC
  Administered 2015-03-17 – 2015-03-21 (×9): 30 mg via ORAL
  Filled 2015-03-17 (×9): qty 1

## 2015-03-17 MED ORDER — IPRATROPIUM-ALBUTEROL 0.5-2.5 (3) MG/3ML IN SOLN
3.0000 mL | Freq: Three times a day (TID) | RESPIRATORY_TRACT | Status: DC
  Administered 2015-03-17 – 2015-03-19 (×5): 3 mL via RESPIRATORY_TRACT
  Filled 2015-03-17 (×7): qty 3

## 2015-03-17 NOTE — Progress Notes (Signed)
[facility] PROGRESS NOTE  [name] CHE:[id number] DOB: 05/17/1935 DOA: 03/16/2015 PCP: No primary care provider on file.  Assessment/Plan: Multiple falls in the setting of Parkinsons Disease. -Could be a natural progression of his Parkinson's Dz vs weakness from acute flu.Pt to follow up with Dr. Jannifer Franklin ASAP after D/C -PT/OT consulted. Recs noted  - Pt to follow up at facility when ready with increased level of care  Fever with H1N1 Influenza. -Pt is found to be flu positive, H1N1 -On tamiflu  Pulmonary vascular congestion -Noted on Xray. Patient's last echo in 2015 showed grade 1 diastolic dysfunction.  Non Productive Cough with mild wheezing with hypoxia -Will continue mucinex, schedule nebs. -Pt remains on 4L [place], baseline o2 at [facility], thus hypoxia is worsened over baseline  Sacral decubitus - multiple skin abrasions. -WOC requested. Recommendations appreciated.  Dementia with acute delirium  -Discussed with caregiver. Patient's mental status is worse over baseline. Currently, pt is not oriented. - Suspect acute toxic-metabolic encephalopathy related to flu  DVT Prophylaxis: SCDs  Code Status: DNR Family Communication: Pt in room, discussed case with caregiver over phone (indicate person spoken with, relationship, and if by phone, the number) Disposition Plan: Pending   Consultants:    Procedures:    Antibiotics:  Tamiflu 3/29>>> (indicate start date, and stop date if known)  HPI/Subjective: Confused, no acute events noted  Objective: Filed Vitals:   03/17/15 0954 03/17/15 1023 03/17/15 1440 03/17/15 1619  BP:  117/71  120/64  Pulse:  74  79  Temp:      TempSrc:      Resp:      Height:      Weight:      SpO2: 96%  96%     Intake/Output Summary (Last 24 hours) at 03/17/15 1739 Last data filed at 03/17/15 1620  Gross per 24 hour  Intake   1413 ml  Output    800 ml  Net    613 ml   Filed Weights   03/16/15 1700 03/17/15 0445   Weight: 73.5 kg (162 lb 0.6 oz) 69.7 kg (153 lb 10.6 oz)    Exam:   General:  Awake, in nad  Cardiovascular: regular, s1, s2  Respiratory: normal resp effort, no wheezing  Abdomen: soft, nondistended  Musculoskeletal: perfused, no clubbing   Data Reviewed: Basic Metabolic Panel:  Recent Labs Lab 03/16/15 0403 03/16/15 0416 03/17/15 0335  NA 137  --  138  K 3.7  --  3.6  CL 105  --  103  CO2 21  --  29  GLUCOSE 115*  --  93  BUN 30*  --  23  CREATININE 1.33  --  1.40*  CALCIUM 8.5  --  8.1*  MG  --  2.0  --    Liver Function Tests:  Recent Labs Lab 03/16/15 0403  AST 54*  ALT 22  ALKPHOS 64  BILITOT 0.7  PROT 4.9*  ALBUMIN 3.4*   No results for input(s): LIPASE, AMYLASE in the last 168 hours. No results for input(s): AMMONIA in the last 168 hours. CBC:  Recent Labs Lab 03/16/15 0403 03/17/15 0335  WBC 4.0 4.3  HGB 13.1 12.0*  HCT 39.3 36.3*  MCV 89.5 90.5  PLT 125* 117*   Cardiac Enzymes: No results for input(s): CKTOTAL, CKMB, CKMBINDEX, TROPONINI in the last 168 hours. BNP (last 3 results) No results for input(s): BNP in the last 8760 hours.  ProBNP (last 3 results)  Recent Labs  04/17/14  2316  PROBNP 1227.0*    CBG:  Recent Labs Lab 03/16/15 1627 03/16/15 2100 03/17/15 0648 03/17/15 1141 03/17/15 1612  GLUCAP 147* 118* 96 147* 163*    Recent Results (from the past 240 hour(s))  Urine culture     Status: None   Collection Time: 03/12/15  8:26 AM  Result Value Ref Range Status   Specimen Description URINE, RANDOM  Final   Special Requests NONE  Final   Colony Count   Final    >=100,000 COLONIES/ML Performed at [facility]    Culture   Final    KLEBSIELLA PNEUMONIAE Note: Two isolates with different morphologies were identified as the same organism.The most resistant organism was reported. Performed at [facility]    Report Status 03/15/2015 FINAL  Final   Organism ID, Bacteria KLEBSIELLA  PNEUMONIAE  Final      Susceptibility   Klebsiella pneumoniae - MIC*    AMPICILLIN RESISTANT      CEFAZOLIN <=4 SENSITIVE Sensitive     CEFTRIAXONE <=1 SENSITIVE Sensitive     CIPROFLOXACIN <=0.25 SENSITIVE Sensitive     GENTAMICIN <=1 SENSITIVE Sensitive     LEVOFLOXACIN <=0.12 SENSITIVE Sensitive     NITROFURANTOIN 32 SENSITIVE Sensitive     TOBRAMYCIN <=1 SENSITIVE Sensitive     TRIMETH/SULFA <=20 SENSITIVE Sensitive     PIP/TAZO <=4 SENSITIVE Sensitive     * KLEBSIELLA PNEUMONIAE  Urine culture     Status: None (Preliminary result)   Collection Time: 03/16/15  4:03 AM  Result Value Ref Range Status   Specimen Description URINE, RANDOM  Final   Special Requests ADD [id number]  Final   Colony Count   Final    >=100,000 COLONIES/ML Performed at [facility]    Culture   Final    [place] Performed at [facility]    Report Status PENDING  Incomplete  Blood culture (routine x 2)     Status: None (Preliminary result)   Collection Time: 03/16/15  4:20 AM  Result Value Ref Range Status   Specimen Description BLOOD RIGHT ARM  Final   Special Requests BOTTLES DRAWN AEROBIC AND ANAEROBIC 10CC EACH  Final   Culture   Final           BLOOD CULTURE RECEIVED NO GROWTH TO DATE CULTURE WILL BE HELD FOR 5 DAYS BEFORE ISSUING A FINAL NEGATIVE REPORT Note: Culture results may be compromised due to an excessive volume of blood received in culture bottles. Performed at [facility]    Report Status PENDING  Incomplete  Blood culture (routine x 2)     Status: None (Preliminary result)   Collection Time: 03/16/15  4:28 AM  Result Value Ref Range Status   Specimen Description BLOOD RIGHT HAND  Final   Special Requests BOTTLES DRAWN AEROBIC ONLY 5CC  Final   Culture   Final           BLOOD CULTURE RECEIVED NO GROWTH TO DATE CULTURE WILL BE HELD FOR 5 DAYS BEFORE ISSUING A FINAL NEGATIVE REPORT Performed at [facility]    Report Status PENDING   Incomplete  MRSA PCR Screening     Status: None   Collection Time: 03/16/15  8:15 AM  Result Value Ref Range Status   MRSA by PCR NEGATIVE NEGATIVE Final    Comment:        The GeneXpert MRSA Assay (FDA approved for NASAL specimens only), is one component of  a comprehensive MRSA colonization surveillance program. It is not intended to diagnose MRSA infection nor to guide or monitor treatment for MRSA infections.      Studies: Dg Chest 1 View  03/16/2015   CLINICAL DATA:  Status post fall, with altered mental status. Initial encounter.  EXAM: CHEST  1 VIEW  COMPARISON:  Chest radiograph performed 03/12/2015  FINDINGS: The lungs are well-aerated. Vascular congestion is noted. No pleural effusion or pneumothorax is seen.  The cardiomediastinal silhouette is borderline normal in size. No acute osseous abnormalities are seen. There is chronic deformity involving left-sided ribs. Chronic degenerative change is noted at the glenohumeral joints bilaterally.  IMPRESSION: Vascular congestion noted. Lungs remain grossly clear. No acute displaced rib fracture seen.   Electronically Signed   By: Garald Balding M.D.   On: 03/16/2015 05:05   Dg Pelvis 1-2 Views  03/16/2015   CLINICAL DATA:  Status post fall, with altered mental status. Initial encounter.  EXAM: PELVIS - 1-2 VIEW  COMPARISON:  Abdominal radiograph performed 10/01/2009  FINDINGS: There is no evidence of fracture or dislocation. Bilateral hip hemiarthroplasties are grossly unremarkable, though incompletely imaged. Mild degenerative change is noted at the lower lumbar spine. The sacroiliac joints are unremarkable in appearance.  The visualized bowel gas pattern is grossly unremarkable in appearance.  IMPRESSION: No evidence of fracture or dislocation. Visualized hardware appears grossly intact.   Electronically Signed   By: Garald Balding M.D.   On: 03/16/2015 05:07   Ct Head Wo Contrast  03/16/2015   CLINICAL DATA:  Found on floor;  unwitnessed fall. Concern for head or cervical spine injury. Initial encounter.  EXAM: CT HEAD WITHOUT CONTRAST  CT CERVICAL SPINE WITHOUT CONTRAST  TECHNIQUE: Multidetector CT imaging of the head and cervical spine was performed following the standard protocol without intravenous contrast. Multiplanar CT image reconstructions of the cervical spine were also generated.  COMPARISON:  CT of the cervical spine performed 01/30/2013, MRI of the brain performed 01/31/2013, and CT of the head performed 03/15/2015  FINDINGS: CT HEAD FINDINGS  There is no evidence of acute infarction, mass lesion, or intra- or extra-axial hemorrhage on CT.  Prominence of the ventricles and sulci reflects moderate cortical volume loss. Cerebellar atrophy is noted. 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 Scattered periventricular and subcortical white matter change likely reflects small vessel ischemic microangiopathy.  The brainstem and fourth ventricle are within normal limits. The basal ganglia are unremarkable in appearance. The cerebral hemispheres demonstrate grossly normal gray-white differentiation. No mass effect or midline shift is seen.  There is no evidence of fracture; visualized osseous structures are unremarkable in appearance. The orbits are within normal limits. Mild mucosal thickening is noted at the sphenoid sinus. The remaining paranasal sinuses and mastoid air cells are well-aerated. No significant soft tissue abnormalities are seen.  CT CERVICAL SPINE FINDINGS  There is no evidence of fracture or subluxation. Vertebral bodies demonstrate normal height and alignment. Minimal disc space narrowing is noted along the lower cervical spine, with scattered anterior and posterior disc osteophyte complexes. Prevertebral soft tissues are within normal limits.  The visualized portions of the thyroid gland are unremarkable in appearance. The visualized lung apices are clear. Scattered  calcification is noted at the carotid bifurcations bilaterally.  IMPRESSION: 1. No evidence of traumatic intracranial injury or fracture. 2. No evidence of fracture or subluxation along the cervical spine. 3. Moderate cortical volume loss and scattered small vessel ischemic microangiopathy. 4. 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 5. Mild mucosal thickening at the sphenoid sinus. 6. Minimal degenerative change along the lower cervical spine. 7. Scattered calcification at the carotid bifurcations bilaterally. Carotid ultrasound would be helpful for further evaluation, when and as deemed clinically appropriate.   Electronically Signed   By: Garald Balding M.D.   On: 03/16/2015 05:16   Ct Cervical Spine Wo Contrast  03/16/2015   CLINICAL DATA:  Found on floor; unwitnessed fall. Concern for head or cervical spine injury. Initial encounter.  EXAM: CT HEAD WITHOUT CONTRAST  CT CERVICAL SPINE WITHOUT CONTRAST  TECHNIQUE: Multidetector CT imaging of the head and cervical spine was performed following the standard protocol without intravenous contrast. Multiplanar CT image reconstructions of the cervical spine were also generated.  COMPARISON:  CT of the cervical spine performed 01/30/2013, MRI of the brain performed 01/31/2013, and CT of the head performed 03/15/2015  FINDINGS: CT HEAD FINDINGS  There is no evidence of acute infarction, mass lesion, or intra- or extra-axial hemorrhage on CT.  Prominence of the ventricles and sulci reflects moderate cortical volume loss. Cerebellar atrophy is noted. 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 Scattered periventricular and subcortical white matter change likely reflects small vessel ischemic microangiopathy.  The brainstem and fourth ventricle are within normal limits. The basal ganglia are unremarkable in appearance. The cerebral hemispheres demonstrate grossly normal gray-white differentiation. No  mass effect or midline shift is seen.  There is no evidence of fracture; visualized osseous structures are unremarkable in appearance. The orbits are within normal limits. Mild mucosal thickening is noted at the sphenoid sinus. The remaining paranasal sinuses and mastoid air cells are well-aerated. No significant soft tissue abnormalities are seen.  CT CERVICAL SPINE FINDINGS  There is no evidence of fracture or subluxation. Vertebral bodies demonstrate normal height and alignment. Minimal disc space narrowing is noted along the lower cervical spine, with scattered anterior and posterior disc osteophyte complexes. Prevertebral soft tissues are within normal limits.  The visualized portions of the thyroid gland are unremarkable in appearance. The visualized lung apices are clear. Scattered calcification is noted at the carotid bifurcations bilaterally.  IMPRESSION: 1. No evidence of traumatic intracranial injury or fracture. 2. No evidence of fracture or subluxation along the cervical spine. 3. Moderate cortical volume loss and scattered small vessel ischemic microangiopathy. 4. 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 5. Mild mucosal thickening at the sphenoid sinus. 6. Minimal degenerative change along the lower cervical spine. 7. Scattered calcification at the carotid bifurcations bilaterally. Carotid ultrasound would be helpful for further evaluation, when and as deemed clinically appropriate.   Electronically Signed   By: Garald Balding M.D.   On: 03/16/2015 05:16    Scheduled Meds: . amLODipine  5 mg Oral Daily  . aspirin  81 mg Oral Daily  . carbidopa-levodopa  1 tablet Oral TID  . clonazePAM  0.25 mg Oral BID  . docusate sodium  100 mg Oral BID  . entacapone  200 mg Oral TID  . feeding supplement (ENSURE ENLIVE)  237 mL Oral BID BM  . feeding supplement (GLUCERNA SHAKE)  237 mL Oral Daily  . fluticasone  2 spray Each Nare Daily  . guaiFENesin  600 mg Oral BID   . hydrALAZINE  25 mg Oral TID  . hydrocerin  1 application Topical BID  . insulin aspart  0-9 Units Subcutaneous TID WC  . ipratropium-albuterol  3 mL Nebulization TID  .  lamoTRIgine  150 mg Oral BID  . lisinopril  10 mg Oral Daily  . neomycin-bacitracin-polymyxin   Topical BID  . oseltamivir  30 mg Oral BID  . pantoprazole  40 mg Oral Q1200  . polyethylene glycol  17 g Oral Daily  . QUEtiapine  50 mg Oral QHS  . sertraline  200 mg Oral Daily  . sodium chloride  3 mL Intravenous Q12H  . traZODone  100 mg Oral QHS   Continuous Infusions:   Principal Problem:   Fever Active Problems:   Parkinson disease   Diabetes mellitus, type 2   Bipolar disorder   Neuromuscular disorder   Chronic diastolic congestive heart failure   Memory difficulty   Fall   Sacral pressure ulcer   Lorianna Spadaccini K  [facility] Pager [phone]. If 7PM-7AM, please contact night-coverage at [email or web address], password [facility] 03/17/2015, 5:39 PM

## 2015-03-17 NOTE — Evaluation (Signed)
Occupational Therapy Evaluation Patient Details Name: [name] MRN: [id number] DOB: 12-Jun-1935 Today's Date: 03/17/2015    History of Present Illness Patient is a 80 year old male with extensive past medical history including type 2 diabetes, Parkinson's disease, hypertension, bipolar, CHF, empyema with thoracotomy. He is brought by EMS for evaluation of a fall. Patient is a resident of [facility]. He is apparently suffered multiple falls this week. He was seen here yesterday for similar injury. Head CT at that time was negative and he was returned home. He returns after yet another fall which may be his fourth and possibly fifth fall in the past week.   Clinical Impression   PTA pt lived at [facility] and reports that he was independent with dressing and required "supervision" for bathing. Pt also reports independence with transfers, however also reports 5 falls recently. Pt is limited by decreased cognition, generalized weakness and decreased safety awareness and will benefit from acute OT to address cognition and safety with ADLs. Pt will benefit from SNF at d/c due to fall risk.     Follow Up Recommendations  SNF;Supervision/Assistance - 24 hour    Equipment Recommendations  None recommended by OT    Recommendations for Other Services       Precautions / Restrictions Precautions Precautions: Fall Precaution Comments: reports 5 falls recently Restrictions Weight Bearing Restrictions: No      Mobility Bed Mobility Overal bed mobility: Needs Assistance Bed Mobility: Supine to Sit     Supine to sit: Mod assist;HOB elevated     General bed mobility comments: max multimoda cues for sequencing; handheld (A) to elevate trunk; pt requiring incr time to initiate purpopseful movement  Transfers Overall transfer level: Needs assistance Equipment used: 2 person hand held assist Transfers: Sit to/from Stand Sit to Stand: +2 physical assistance;Mod assist          General transfer comment: 2 person for safety and handheld (A) to balance; max multimodal cues for safety with use of gt belt to steady         ADL Overall ADL's : Needs assistance/impaired Eating/Feeding: Independent;Sitting   Grooming: Set up;Sitting   Upper Body Bathing: Set up;Sitting   Lower Body Bathing: Moderate assistance;+2 for physical assistance;Sit to/from stand   Upper Body Dressing : Minimal assistance;Sitting   Lower Body Dressing: Maximal assistance;+2 for physical assistance;Sit to/from stand   Toilet Transfer: Moderate assistance;+2 for physical assistance;Stand-pivot Toilet Transfer Details (indicate cue type and reason): 2 person hand held assist to stand and pivot to chair. pt with shuffling steps         Functional mobility during ADLs: Moderate assistance;+2 for physical assistance       Vision Vision Assessment?: No apparent visual deficits          Pertinent Vitals/Pain Pain Assessment: No/denies pain     Hand Dominance Right   Extremity/Trunk Assessment Upper Extremity Assessment Upper Extremity Assessment: Overall WFL for tasks assessed   Lower Extremity Assessment Lower Extremity Assessment: Defer to PT evaluation   Cervical / Trunk Assessment Cervical / Trunk Assessment: Normal   Communication Communication Communication: HOH   Cognition Arousal/Alertness: Lethargic;Suspect due to medications Behavior During Therapy: Agitated Overall Cognitive Status: Impaired/Different from baseline Area of Impairment: Orientation;Attention;Safety/judgement;Problem solving;Following commands Orientation Level: Disoriented to;Person;Place;Time;Situation Current Attention Level: Focused Memory: Decreased short-term memory Following Commands: Follows one step commands inconsistently;Follows one step commands with increased time Safety/Judgement: Decreased awareness of safety;Decreased awareness of deficits   Problem Solving: Slow  processing;Decreased initiation;Requires  verbal cues;Requires tactile cues;Difficulty sequencing General Comments: pt arousable with cues but falling asleep throughout treatment; pt becoming agitated at times with questions and commands               Home Living Family/patient expects to be discharged to:: Assisted living                             Home Equipment: Wheelchair - manual          Prior Functioning/Environment Level of Independence: Needs assistance  Gait / Transfers Assistance Needed: Pt self-reports he is I with transfers in to w/c, but has also had frequent falls with possibly 5 in the last week ADL's / Homemaking Assistance Needed: has supervision for shower per pt report, but independent with dressing        OT Diagnosis: Generalized weakness;Altered mental status   OT Problem List: Decreased strength;Decreased activity tolerance;Impaired balance (sitting and/or standing);Decreased cognition;Decreased safety awareness   OT Treatment/Interventions: Self-care/ADL training;Therapeutic exercise;Energy conservation;DME and/or AE instruction;Therapeutic activities;Patient/family education;Balance training;Cognitive remediation/compensation    OT Goals(Current goals can be found in the care plan section) Acute Rehab OT Goals Patient Stated Goal: to not have to do therapy OT Goal Formulation: With patient Time For Goal Achievement: 03/31/15 Potential to Achieve Goals: Good ADL Goals Pt Will Perform Lower Body Bathing: sit to/from stand;with min assist Pt Will Perform Upper Body Dressing: with set-up;sitting Pt Will Perform Lower Body Dressing: with min assist;sit to/from stand Pt Will Transfer to Toilet: with min assist;ambulating;bedside commode  OT Frequency: Min 1X/week           Co-evaluation PT/OT/SLP Co-Evaluation/Treatment: Yes Reason for Co-Treatment: For patient/therapist safety   OT goals addressed during session: ADL's and self-care       End of Session Equipment Utilized During Treatment: Gait belt;Oxygen  Activity Tolerance: Patient tolerated treatment well Patient left: in chair;with call bell/phone within reach;with chair alarm set   Time: 5726-2035 OT Time Calculation (min): 23 min Charges:  OT General Charges $OT Visit: 1 Procedure OT Evaluation $Initial OT Evaluation Tier I: 1 Procedure G-Codes: OT G-codes **NOT FOR INPATIENT CLASS** Functional Assessment Tool Used: clinical judgement Functional Limitation: Self care Self Care Current Status (D9741): At least 40 percent but less than 60 percent impaired, limited or restricted Self Care Goal Status (U3845): At least 20 percent but less than 40 percent impaired, limited or restricted  Juluis Rainier 03/17/2015, 10:02 AM  Cyndie Chime, OTR/L Occupational Therapist [phone] (pager)

## 2015-03-17 NOTE — Clinical Social Work Psychosocial (Signed)
     Clinical Social Work Department BRIEF PSYCHOSOCIAL ASSESSMENT 03/17/2015  Patient:  [name], [name]     Account Number:  [id number]     Admit date:  03/16/2015  Clinical Social Worker:  Adair Laundry  Date/Time:  03/17/2015 01:41 PM  Referred by:  Physician  Date Referred:  03/17/2015 Referred for  ALF Placement   Other Referral:   Interview type:  Other - See comment Other interview type:   Spoke with pt HCPOA Carolyn    PSYCHOSOCIAL DATA Living Status:  FACILITY Admitted from facility:  [facility] Level of care:  Assisted Living Primary support name:  Printice Hellmer Primary support relationship to patient:  FAMILY Degree of support available:   Pt has good support    CURRENT CONCERNS Current Concerns  Post-Acute Placement   Other Concerns:    SOCIAL WORK ASSESSMENT / PLAN CSW spoke with [place] about pt and pt condition. She informed CSW that pt was modified independent prior to hospital admission. CSw informed Theadora Rama that at this time pt is requiring higher level of assistance and recommendation is for SNF. Brandy informed CSW that facility is able to handle higher level of care and increase pt assistance. CSW notified Theadora Rama for potential for pt to dc today. She confirmed should be no issues but CSW will need to fax dc summary to [phone] to confirm.    CSW received call back from pt [facility]. She confirmed pt was from [facility]. She expressed frustration with hospital staff communication. She informed CSW she had just left the hospital and had been told pt will need SNF. CSW apologized for confusion and explained CSW role. CSW notified [name] that while pt recommendation is for SNF but does not have a qualifying stay for SNF and if this is desired dc location will be private pay. CSW did notify [name] that [facility] has been notified of increased care need and they can provide this. [name] informed CSW  that plan would be for pt to return to [facility]. [name] further upset when CSW mentioned potential for dc today. She wishes this would have been communicated to her while she was here. She explained that now she would need to return the hospital. CSW informed her that if plan is for pt to set up with non-emergent ambulance she does not need to be present and CSW will take care of coordination. She thanked CSW for information. CSW informed [name] that CSW would call her back again once CSW has spoken with MD.   Assessment/plan status:  Psychosocial Support/Ongoing Assessment of Needs Other assessment/ plan:   Information/referral to community resources:   None needed at this time    PATIENTS/FAMILYS RESPONSE TO PLAN OF CARE: [name] upset initially but is agreeable to dc back to ALF.      [place], [place]

## 2015-03-17 NOTE — Progress Notes (Signed)
Per MD, plan is for potential dc tomorrow. CSW updated facility and left voicemail for [name] to notify.  [place], [place]

## 2015-03-17 NOTE — Progress Notes (Signed)
Physical Therapy Treatment Patient Details Name: YAMATO KOPF MRN: [id number] DOB: May 07, 1935 Today's Date: 03/17/2015    History of Present Illness Patient is a 80 year old male with extensive past medical history including type 2 diabetes, Parkinson's disease, hypertension, bipolar, CHF, empyema with thoracotomy. He is brought by EMS for evaluation of a fall. Patient is a resident of [facility]. He is apparently suffered multiple falls this week. He was seen here yesterday for similar injury. Head CT at that time was negative and he was returned home. He returns after yet another fall which may be his fourth and possibly fifth fall in the past week.    PT Comments    Pt more alert today, was falling asleep but aroused with cues. Pt very unsteady and demo decr safety awareness. Due to level of (A) needed and frequent falls, cont to recommend SNF.   Follow Up Recommendations  SNF;Supervision/Assistance - 24 hour     Equipment Recommendations  Rolling walker with 5" wheels    Recommendations for Other Services       Precautions / Restrictions Precautions Precautions: Fall Precaution Comments: reports 5 falls recently Restrictions Weight Bearing Restrictions: No    Mobility  Bed Mobility Overal bed mobility: Needs Assistance Bed Mobility: Supine to Sit     Supine to sit: Mod assist;HOB elevated     General bed mobility comments: max multimoda cues for sequencing; handheld (A) to elevate trunk; pt requiring incr time to initiate purpopseful movement  Transfers Overall transfer level: Needs assistance Equipment used: 2 person hand held assist Transfers: Sit to/from Stand Sit to Stand: +2 physical assistance;Mod assist         General transfer comment: 2 person for safety and handheld (A) to balance; max multimodal cues for safety with use of gt belt to steady  Ambulation/Gait Ambulation/Gait assistance: +2 physical assistance;Mod assist Ambulation  Distance (Feet): 12 Feet Assistive device: 2 person hand held assist Gait Pattern/deviations: Step-through pattern;Decreased stride length;Shuffle;Narrow base of support;Trunk flexed Gait velocity: decr Gait velocity interpretation: Below normal speed for age/gender General Gait Details: cues for safety and gt sequencing; pt impulsive with ambulation and demo decr safety awareness    Stairs            Wheelchair Mobility    Modified Rankin (Stroke Patients Only)       Balance Overall balance assessment: Needs assistance;History of Falls Sitting-balance support: Feet supported;Single extremity supported;No upper extremity supported Sitting balance-Leahy Scale: Fair Sitting balance - Comments: sat EOB and denied dizziness; initially using handheld (A) to balance; progressed to supervision at EOB   Standing balance support: During functional activity;Bilateral upper extremity supported Standing balance-Leahy Scale: Poor Standing balance comment: 2 person HHA to balance             High level balance activites: Backward walking;Direction changes High Level Balance Comments: cues for safety    Cognition Arousal/Alertness: Lethargic;Suspect due to medications Behavior During Therapy: Agitated Overall Cognitive Status: Impaired/Different from baseline Area of Impairment: Orientation;Attention;Safety/judgement;Problem solving;Following commands Orientation Level: Disoriented to;Person;Place;Time;Situation Current Attention Level: Focused Memory: Decreased short-term memory Following Commands: Follows one step commands inconsistently;Follows one step commands with increased time Safety/Judgement: Decreased awareness of safety;Decreased awareness of deficits   Problem Solving: Slow processing;Decreased initiation;Requires verbal cues;Requires tactile cues;Difficulty sequencing General Comments: pt arousable with cues but falling asleep throughout treatment; pt becoming  agitated at times with questions and commands     Exercises General Exercises - Lower Extremity Ankle Circles/Pumps: AROM;Both;10 reps;AAROM;Supine  General Comments        Pertinent Vitals/Pain Pain Assessment: No/denies pain    Home Living                      Prior Function            PT Goals (current goals can now be found in the care plan section) Acute Rehab PT Goals Patient Stated Goal: to not have to do therapy PT Goal Formulation: With patient Time For Goal Achievement: 03/30/15 Potential to Achieve Goals: Fair Progress towards PT goals: Progressing toward goals    Frequency  Min 3X/week    PT Plan Current plan remains appropriate    Co-evaluation             End of Session Equipment Utilized During Treatment: Oxygen Activity Tolerance: Patient limited by lethargy;Patient limited by fatigue Patient left: in chair;with call bell/phone within reach;with chair alarm set     Time: [phone] PT Time Calculation (min) (ACUTE ONLY): 23 min  Charges:  $Gait Training: 8-22 mins                    G Codes:  Functional Assessment Tool Used: clinical judgement Functional Limitation: Mobility: Walking and moving around Mobility: Walking and Moving Around Current Status [phone]): At least 80 percent but less than 100 percent impaired, limited or restricted Mobility: Walking and Moving Around Goal Status [phone]): At least 1 percent but less than 20 percent impaired, limited or restricted   Gustavus Bryant [place] 03/17/2015, 9:46 AM

## 2015-03-18 DIAGNOSIS — F419 Anxiety disorder, unspecified: Secondary | ICD-10-CM | POA: Diagnosis present

## 2015-03-18 DIAGNOSIS — F0280 Dementia in other diseases classified elsewhere without behavioral disturbance: Secondary | ICD-10-CM | POA: Diagnosis present

## 2015-03-18 DIAGNOSIS — N390 Urinary tract infection, site not specified: Secondary | ICD-10-CM | POA: Diagnosis present

## 2015-03-18 DIAGNOSIS — Z888 Allergy status to other drugs, medicaments and biological substances status: Secondary | ICD-10-CM | POA: Diagnosis not present

## 2015-03-18 DIAGNOSIS — G709 Myoneural disorder, unspecified: Secondary | ICD-10-CM

## 2015-03-18 DIAGNOSIS — R05 Cough: Secondary | ICD-10-CM | POA: Diagnosis present

## 2015-03-18 DIAGNOSIS — R0902 Hypoxemia: Secondary | ICD-10-CM | POA: Diagnosis present

## 2015-03-18 DIAGNOSIS — F319 Bipolar disorder, unspecified: Secondary | ICD-10-CM | POA: Diagnosis present

## 2015-03-18 DIAGNOSIS — B965 Pseudomonas (aeruginosa) (mallei) (pseudomallei) as the cause of diseases classified elsewhere: Secondary | ICD-10-CM | POA: Diagnosis present

## 2015-03-18 DIAGNOSIS — Z7982 Long term (current) use of aspirin: Secondary | ICD-10-CM | POA: Diagnosis not present

## 2015-03-18 DIAGNOSIS — I5032 Chronic diastolic (congestive) heart failure: Secondary | ICD-10-CM | POA: Diagnosis not present

## 2015-03-18 DIAGNOSIS — Z79899 Other long term (current) drug therapy: Secondary | ICD-10-CM | POA: Diagnosis not present

## 2015-03-18 DIAGNOSIS — G20 Parkinson's disease: Secondary | ICD-10-CM | POA: Diagnosis not present

## 2015-03-18 DIAGNOSIS — L89159 Pressure ulcer of sacral region, unspecified stage: Secondary | ICD-10-CM | POA: Diagnosis present

## 2015-03-18 DIAGNOSIS — R413 Other amnesia: Secondary | ICD-10-CM | POA: Diagnosis not present

## 2015-03-18 DIAGNOSIS — R0989 Other specified symptoms and signs involving the circulatory and respiratory systems: Secondary | ICD-10-CM | POA: Diagnosis present

## 2015-03-18 DIAGNOSIS — E119 Type 2 diabetes mellitus without complications: Secondary | ICD-10-CM | POA: Diagnosis present

## 2015-03-18 DIAGNOSIS — R296 Repeated falls: Secondary | ICD-10-CM | POA: Diagnosis present

## 2015-03-18 DIAGNOSIS — J101 Influenza due to other identified influenza virus with other respiratory manifestations: Secondary | ICD-10-CM | POA: Diagnosis present

## 2015-03-18 DIAGNOSIS — W19XXXA Unspecified fall, initial encounter: Secondary | ICD-10-CM | POA: Diagnosis not present

## 2015-03-18 DIAGNOSIS — N400 Enlarged prostate without lower urinary tract symptoms: Secondary | ICD-10-CM | POA: Diagnosis present

## 2015-03-18 DIAGNOSIS — M1990 Unspecified osteoarthritis, unspecified site: Secondary | ICD-10-CM | POA: Diagnosis present

## 2015-03-18 DIAGNOSIS — R509 Fever, unspecified: Secondary | ICD-10-CM | POA: Diagnosis present

## 2015-03-18 DIAGNOSIS — Z96649 Presence of unspecified artificial hip joint: Secondary | ICD-10-CM | POA: Diagnosis present

## 2015-03-18 DIAGNOSIS — E875 Hyperkalemia: Secondary | ICD-10-CM | POA: Diagnosis not present

## 2015-03-18 DIAGNOSIS — Z8614 Personal history of Methicillin resistant Staphylococcus aureus infection: Secondary | ICD-10-CM | POA: Diagnosis not present

## 2015-03-18 DIAGNOSIS — G92 Toxic encephalopathy: Secondary | ICD-10-CM | POA: Diagnosis present

## 2015-03-18 DIAGNOSIS — I10 Essential (primary) hypertension: Secondary | ICD-10-CM | POA: Diagnosis present

## 2015-03-18 DIAGNOSIS — Z66 Do not resuscitate: Secondary | ICD-10-CM | POA: Diagnosis present

## 2015-03-18 LAB — BASIC METABOLIC PANEL
Anion gap: 8 (ref 5–15)
BUN: 25 mg/dL — AB (ref 6–23)
CHLORIDE: 105 mmol/L (ref 96–112)
CO2: 25 mmol/L (ref 19–32)
Calcium: 8.3 mg/dL — ABNORMAL LOW (ref 8.4–10.5)
Creatinine, Ser: 1.25 mg/dL (ref 0.50–1.35)
GFR calc Af Amer: 62 mL/min — ABNORMAL LOW (ref >90)
GFR calc non Af Amer: 53 mL/min — ABNORMAL LOW (ref >90)
GLUCOSE: 100 mg/dL — AB (ref 70–99)
POTASSIUM: 4 mmol/L (ref 3.5–5.1)
SODIUM: 138 mmol/L (ref 135–145)

## 2015-03-18 LAB — CBC
HCT: 34.6 % — ABNORMAL LOW (ref 39.0–52.0)
Hemoglobin: 11.4 g/dL — ABNORMAL LOW (ref 13.0–17.0)
MCH: 29.7 pg (ref 26.0–34.0)
MCHC: 32.9 g/dL (ref 30.0–36.0)
MCV: 90.1 fL (ref 78.0–100.0)
Platelets: 116 10*3/uL — ABNORMAL LOW (ref 150–400)
RBC: 3.84 MIL/uL — AB (ref 4.22–5.81)
RDW: 14.6 % (ref 11.5–15.5)
WBC: 3.8 10*3/uL — AB (ref 4.0–10.5)

## 2015-03-18 LAB — GLUCOSE, CAPILLARY
GLUCOSE-CAPILLARY: 158 mg/dL — AB (ref 70–99)
Glucose-Capillary: 104 mg/dL — ABNORMAL HIGH (ref 70–99)
Glucose-Capillary: 185 mg/dL — ABNORMAL HIGH (ref 70–99)
Glucose-Capillary: 235 mg/dL — ABNORMAL HIGH (ref 70–99)

## 2015-03-18 LAB — URINE CULTURE: Colony Count: >=100000

## 2015-03-18 MED ORDER — METHYLPREDNISOLONE SODIUM SUCC 125 MG IJ SOLR
60.0000 mg | Freq: Two times a day (BID) | INTRAMUSCULAR | Status: DC
  Administered 2015-03-18 – 2015-03-19 (×3): 60 mg via INTRAVENOUS
  Filled 2015-03-18: qty 2
  Filled 2015-03-18 (×2): qty 0.96
  Filled 2015-03-18: qty 2

## 2015-03-18 MED ORDER — SORBITOL 70 % SOLN
30.0000 mL | Status: AC
  Administered 2015-03-18: 30 mL via ORAL
  Filled 2015-03-18: qty 30

## 2015-03-18 NOTE — Progress Notes (Signed)
[facility] PROGRESS NOTE  [name] NWG:[id number] DOB: 1935/10/22 DOA: 03/16/2015 PCP: No primary care provider on file.  Assessment/Plan: Multiple falls in the setting of Parkinsons Disease. -Could be a natural progression of his Parkinson's Dz vs weakness from acute flu.Pt to follow up with Dr. Jannifer Franklin ASAP after D/C -PT/OT consulted. Recs for SNF noted  Fever with H1N1 Influenza. -Pt is found to be flu positive, H1N1 -On tamiflu  Pulmonary vascular congestion -Noted on Xray.  -Patient's last echo in 2015 showed grade 1 diastolic dysfunction.  Non Productive Cough with mild wheezing with hypoxia -Will continue mucinex, schedule nebs. -Pt remains on 4L , baseline o2 at [facility], thus hypoxia is worsened over baseline -Had started solumedrol IV 60mg  q12hrs -Cont to wean O2 as toelrated  Sacral decubitus - multiple skin abrasions. -WOC requested. Recommendations appreciated.  Dementia with acute delirium  -Discussed with caregiver. Patient's mental status is worse over baseline. Currently, pt is not oriented. - Suspect acute toxic-metabolic encephalopathy related to flu  DVT Prophylaxis: SCDs  Code Status: DNR Family Communication: Pt in room, discussed case with caregiver over phone on 3/30 Disposition Plan: Pending SNF placement   Consultants:    Procedures:    Antibiotics:  Tamiflu 3/29>>>   HPI/Subjective: No acute events noted.  Objective: Filed Vitals:   03/18/15 0501 03/18/15 0745 03/18/15 1016 03/18/15 1332  BP: 94/52  103/54 117/57  Pulse: 58  67 60  Temp: 97.5 F (36.4 C)  98.1 F (36.7 C) 99 F (37.2 C)  TempSrc: Oral  Oral Oral  Resp: 16  18 20   Height:      Weight: 69.3 kg (152 lb 12.5 oz)     SpO2: 90% 94% 90% 94%    Intake/Output Summary (Last 24 hours) at 03/18/15 1349 Last data filed at 03/18/15 1217  Gross per 24 hour  Intake   1891 ml  Output   1100 ml  Net    791 ml   Filed Weights   03/16/15 1700 03/17/15  0445 03/18/15 0501  Weight: 73.5 kg (162 lb 0.6 oz) 69.7 kg (153 lb 10.6 oz) 69.3 kg (152 lb 12.5 oz)    Exam:   General:  Awake, in nad  Cardiovascular: regular, s1, s2  Respiratory: normal resp effort, end-expiratory wheezing throughout  Abdomen: soft, nondistended  Musculoskeletal: perfused, no clubbing   Data Reviewed: Basic Metabolic Panel:  Recent Labs Lab 03/16/15 0403 03/16/15 0416 03/17/15 0335 03/18/15 0601  NA 137  --  138 138  K 3.7  --  3.6 4.0  CL 105  --  103 105  CO2 21  --  29 25  GLUCOSE 115*  --  93 100*  BUN 30*  --  23 25*  CREATININE 1.33  --  1.40* 1.25  CALCIUM 8.5  --  8.1* 8.3*  MG  --  2.0  --   --    Liver Function Tests:  Recent Labs Lab 03/16/15 0403  AST 54*  ALT 22  ALKPHOS 64  BILITOT 0.7  PROT 4.9*  ALBUMIN 3.4*   No results for input(s): LIPASE, AMYLASE in the last 168 hours. No results for input(s): AMMONIA in the last 168 hours. CBC:  Recent Labs Lab 03/16/15 0403 03/17/15 0335 03/18/15 0601  WBC 4.0 4.3 3.8*  HGB 13.1 12.0* 11.4*  HCT 39.3 36.3* 34.6*  MCV 89.5 90.5 90.1  PLT 125* 117* 116*   Cardiac Enzymes: No results for input(s): CKTOTAL, CKMB, CKMBINDEX, TROPONINI in the  last 168 hours. BNP (last 3 results) No results for input(s): BNP in the last 8760 hours.  ProBNP (last 3 results)  Recent Labs  04/17/14 2316  PROBNP 1227.0*    CBG:  Recent Labs Lab 03/17/15 1141 03/17/15 1612 03/17/15 2043 03/18/15 0621 03/18/15 1140  GLUCAP 147* 163* 100* 104* 185*    Recent Results (from the past 240 hour(s))  Urine culture     Status: None   Collection Time: 03/12/15  8:26 AM  Result Value Ref Range Status   Specimen Description URINE, RANDOM  Final   Special Requests NONE  Final   Colony Count   Final    >=100,000 COLONIES/ML Performed at [facility]    Culture   Final    KLEBSIELLA PNEUMONIAE Note: Two isolates with different morphologies were identified as the same  organism.The most resistant organism was reported. Performed at [facility]    Report Status 03/15/2015 FINAL  Final   Organism ID, Bacteria KLEBSIELLA PNEUMONIAE  Final      Susceptibility   Klebsiella pneumoniae - MIC*    AMPICILLIN RESISTANT      CEFAZOLIN <=4 SENSITIVE Sensitive     CEFTRIAXONE <=1 SENSITIVE Sensitive     CIPROFLOXACIN <=0.25 SENSITIVE Sensitive     GENTAMICIN <=1 SENSITIVE Sensitive     LEVOFLOXACIN <=0.12 SENSITIVE Sensitive     NITROFURANTOIN 32 SENSITIVE Sensitive     TOBRAMYCIN <=1 SENSITIVE Sensitive     TRIMETH/SULFA <=20 SENSITIVE Sensitive     PIP/TAZO <=4 SENSITIVE Sensitive     * KLEBSIELLA PNEUMONIAE  Urine culture     Status: None   Collection Time: 03/16/15  4:03 AM  Result Value Ref Range Status   Specimen Description URINE, RANDOM  Final   Special Requests ADD [id number]  Final   Colony Count   Final    >=100,000 COLONIES/ML Performed at [facility]    Culture   Final    PSEUDOMONAS AERUGINOSA Performed at [facility]    Report Status 03/18/2015 FINAL  Final   Organism ID, Bacteria PSEUDOMONAS AERUGINOSA  Final      Susceptibility   Pseudomonas aeruginosa - MIC*    CEFEPIME 2 SENSITIVE Sensitive     CEFTAZIDIME 4 SENSITIVE Sensitive     CIPROFLOXACIN <=0.25 SENSITIVE Sensitive     GENTAMICIN <=1 SENSITIVE Sensitive     IMIPENEM 2 SENSITIVE Sensitive     PIP/TAZO 8 SENSITIVE Sensitive     TOBRAMYCIN <=1 SENSITIVE Sensitive     * PSEUDOMONAS AERUGINOSA  Blood culture (routine x 2)     Status: None (Preliminary result)   Collection Time: 03/16/15  4:20 AM  Result Value Ref Range Status   Specimen Description BLOOD RIGHT ARM  Final   Special Requests BOTTLES DRAWN AEROBIC AND ANAEROBIC 10CC EACH  Final   Culture   Final           BLOOD CULTURE RECEIVED NO GROWTH TO DATE CULTURE WILL BE HELD FOR 5 DAYS BEFORE ISSUING A FINAL NEGATIVE REPORT Note: Culture results may be compromised due to an excessive volume  of blood received in culture bottles. Performed at [facility]    Report Status PENDING  Incomplete  Blood culture (routine x 2)     Status: None (Preliminary result)   Collection Time: 03/16/15  4:28 AM  Result Value Ref Range Status   Specimen Description BLOOD RIGHT HAND  Final   Special Requests BOTTLES DRAWN AEROBIC ONLY 5CC  Final   Culture   Final           BLOOD CULTURE RECEIVED NO GROWTH TO DATE CULTURE WILL BE HELD FOR 5 DAYS BEFORE ISSUING A FINAL NEGATIVE REPORT Performed at [facility]    Report Status PENDING  Incomplete  MRSA PCR Screening     Status: None   Collection Time: 03/16/15  8:15 AM  Result Value Ref Range Status   MRSA by PCR NEGATIVE NEGATIVE Final    Comment:        The GeneXpert MRSA Assay (FDA approved for NASAL specimens only), is one component of a comprehensive MRSA colonization surveillance program. It is not intended to diagnose MRSA infection nor to guide or monitor treatment for MRSA infections.      Studies: No results found.  Scheduled Meds: . amLODipine  5 mg Oral Daily  . aspirin  81 mg Oral Daily  . carbidopa-levodopa  1 tablet Oral TID  . clonazePAM  0.25 mg Oral BID  . docusate sodium  100 mg Oral BID  . entacapone  200 mg Oral TID  . feeding supplement (ENSURE ENLIVE)  237 mL Oral BID BM  . feeding supplement (GLUCERNA SHAKE)  237 mL Oral Daily  . fluticasone  2 spray Each Nare Daily  . guaiFENesin  600 mg Oral BID  . hydrALAZINE  25 mg Oral TID  . hydrocerin  1 application Topical BID  . insulin aspart  0-9 Units Subcutaneous TID WC  . ipratropium-albuterol  3 mL Nebulization TID  . lamoTRIgine  150 mg Oral BID  . lisinopril  10 mg Oral Daily  . methylPREDNISolone (SOLU-MEDROL) injection  60 mg Intravenous Q12H  . neomycin-bacitracin-polymyxin   Topical BID  . oseltamivir  30 mg Oral BID  . pantoprazole  40 mg Oral Q1200  . polyethylene glycol  17 g Oral Daily  . QUEtiapine  50 mg Oral QHS  .  sertraline  200 mg Oral Daily  . sodium chloride  3 mL Intravenous Q12H  . traZODone  100 mg Oral QHS   Continuous Infusions:   Principal Problem:   Fever Active Problems:   Parkinson disease   Diabetes mellitus, type 2   Bipolar disorder   Neuromuscular disorder   Chronic diastolic congestive heart failure   Memory difficulty   Fall   Sacral pressure ulcer   Daneshia Tavano K  [facility] Pager [phone]. If 7PM-7AM, please contact night-coverage at [email or web address], password [email or web address] 03/18/2015, 1:49 PM

## 2015-03-18 NOTE — Clinical Social Work Placement (Signed)
Clinical Social Work Department CLINICAL SOCIAL WORK PLACEMENT NOTE 03/18/2015  Patient:  [name], [name]  Account Number:  [id number] Admit date:  03/16/2015  Clinical Social Worker:  Aldona Bar Cleone Hulick, CLINICAL SOCIAL WORKER  Date/time:  03/18/2015 12:21 PM  Clinical Social Work is seeking post-discharge placement for this patient at the following level of care:   SKILLED NURSING   (*CSW will update this form in Epic as items are completed)   03/18/2015  Patient/family provided with [place] Department of Clinical Social Work's list of facilities offering this level of care within the geographic area requested by the patient (or if unable, by the patient's family).  03/18/2015  Patient/family informed of their freedom to choose among providers that offer the needed level of care, that participate in Medicare, Medicaid or managed care program needed by the patient, have an available bed and are willing to accept the patient.  03/18/2015  Patient/family informed of [facility]' ownership interest in [facility], as well as of the fact that they are under no obligation to receive care at this facility.  PASARR submitted to EDS on 03/18/2015 PASARR number received on 03/18/2015  FL2 transmitted to all facilities in geographic area requested by pt/family on  03/18/2015 FL2 transmitted to all facilities within larger geographic area on 03/18/2015  Patient informed that his/her managed care company has contracts with or will negotiate with  certain facilities, including the following:     Patient/family informed of bed offers received:   Patient chooses bed at  Physician recommends and patient chooses bed at    Patient to be transferred to  on   Patient to be transferred to facility by  Patient and family notified of transfer on  Name of family member notified:    The following physician request were entered in Epic: Please sign FL2   Additional  Comments:   Cherlynn Polo, [place] Intern [phone]

## 2015-03-18 NOTE — Progress Notes (Signed)
[name] [phone] would like for pt to be placed at SNF following d/c for short term before returning back to [facility]. Pt information faxed out to [facility] per [facility] request. Sw will follow up with POA tomorrow with bed offers. [name] is very nice and and concerned with care after d/c. Pt will need a 3 day inpatient stay.   Cherlynn Polo, [place] intern [phone]

## 2015-03-18 NOTE — Progress Notes (Signed)
UR completed 

## 2015-03-19 DIAGNOSIS — J101 Influenza due to other identified influenza virus with other respiratory manifestations: Principal | ICD-10-CM

## 2015-03-19 LAB — GLUCOSE, CAPILLARY
Glucose-Capillary: 111 mg/dL — ABNORMAL HIGH (ref 70–99)
Glucose-Capillary: 94 mg/dL (ref 70–99)
Glucose-Capillary: 168 mg/dL — ABNORMAL HIGH (ref 70–99)
Glucose-Capillary: 183 mg/dL — ABNORMAL HIGH (ref 70–99)

## 2015-03-19 LAB — CBC
HEMATOCRIT: 36.2 % — AB (ref 39.0–52.0)
Hemoglobin: 11.7 g/dL — ABNORMAL LOW (ref 13.0–17.0)
MCH: 29.5 pg (ref 26.0–34.0)
MCHC: 32.3 g/dL (ref 30.0–36.0)
MCV: 91.4 fL (ref 78.0–100.0)
Platelets: 154 10*3/uL (ref 150–400)
RBC: 3.96 MIL/uL — ABNORMAL LOW (ref 4.22–5.81)
RDW: 14.5 % (ref 11.5–15.5)
WBC: 4.1 10*3/uL (ref 4.0–10.5)

## 2015-03-19 LAB — BASIC METABOLIC PANEL
ANION GAP: 4 — AB (ref 5–15)
BUN: 34 mg/dL — ABNORMAL HIGH (ref 6–23)
CO2: 33 mmol/L — ABNORMAL HIGH (ref 19–32)
Calcium: 9 mg/dL (ref 8.4–10.5)
Chloride: 104 mmol/L (ref 96–112)
Creatinine, Ser: 1.27 mg/dL (ref 0.50–1.35)
GFR, EST AFRICAN AMERICAN: 61 mL/min — AB (ref >90)
GFR, EST NON AFRICAN AMERICAN: 52 mL/min — AB (ref >90)
Glucose, Bld: 151 mg/dL — ABNORMAL HIGH (ref 70–99)
Potassium: 5.2 mmol/L — ABNORMAL HIGH (ref 3.5–5.1)
Sodium: 141 mmol/L (ref 135–145)

## 2015-03-19 MED ORDER — IPRATROPIUM-ALBUTEROL 0.5-2.5 (3) MG/3ML IN SOLN: 3.0000 mL | RESPIRATORY_TRACT | Status: DC | PRN

## 2015-03-19 MED ORDER — METHYLPREDNISOLONE SODIUM SUCC 125 MG IJ SOLR
60.0000 mg | INTRAMUSCULAR | Status: DC
  Administered 2015-03-19 – 2015-03-20 (×4): 60 mg via INTRAVENOUS
  Filled 2015-03-19: qty 0.96
  Filled 2015-03-19: qty 2
  Filled 2015-03-19 (×2): qty 0.96
  Filled 2015-03-19: qty 2
  Filled 2015-03-19: qty 0.96
  Filled 2015-03-19: qty 2
  Filled 2015-03-19 (×2): qty 0.96

## 2015-03-19 MED ORDER — IPRATROPIUM-ALBUTEROL 0.5-2.5 (3) MG/3ML IN SOLN
3.0000 mL | RESPIRATORY_TRACT | Status: DC
  Administered 2015-03-19 (×2): 3 mL via RESPIRATORY_TRACT
  Filled 2015-03-19 (×2): qty 3

## 2015-03-19 MED ORDER — IPRATROPIUM-ALBUTEROL 0.5-2.5 (3) MG/3ML IN SOLN
3.0000 mL | Freq: Three times a day (TID) | RESPIRATORY_TRACT | Status: DC
  Administered 2015-03-20 – 2015-03-21 (×4): 3 mL via RESPIRATORY_TRACT
  Filled 2015-03-19 (×4): qty 3

## 2015-03-19 MED ORDER — DEXTROSE 5 % IV SOLN
1.0000 g | INTRAVENOUS | Status: DC
  Administered 2015-03-19 – 2015-03-21 (×3): 1 g via INTRAVENOUS
  Filled 2015-03-19 (×4): qty 1

## 2015-03-19 NOTE — Progress Notes (Signed)
Informed Randall Hiss SW of pt's POA asking to speak with him.  Instructed that he has done that and made her aware that pt will be d/c on Monday.  Karie Kirks, Therapist, sports.

## 2015-03-19 NOTE — Progress Notes (Signed)
Physical Therapy Treatment Patient Details Name: [name] MRN: [id number] DOB: 23-Sep-1935 Today's Date: 03/19/2015    History of Present Illness Patient is a 80 year old male with extensive past medical history including type 2 diabetes, Parkinson's disease, hypertension, bipolar, CHF, empyema with thoracotomy. He is brought by EMS for evaluation of a fall. Patient is a resident of [facility]. He is apparently suffered multiple falls this week. He was seen here yesterday for similar injury. Head CT at that time was negative and he was returned home. He returns after yet another fall which may be his fourth and possibly fifth fall in the past week.    PT Comments    Patient tolerated transfer and hygiene activities this session. Performed with 2 person min assist for stability. Patient declined ambulation, anxious that he could not tolerate it. Patient assisted back to bed and repositioned.    Follow Up Recommendations  SNF;Supervision/Assistance - 24 hour     Equipment Recommendations  Rolling walker with 5" wheels    Recommendations for Other Services       Precautions / Restrictions Precautions Precautions: Fall Precaution Comments: reports 5 falls recently Restrictions Weight Bearing Restrictions: No    Mobility  Bed Mobility Overal bed mobility: Needs Assistance Bed Mobility: Supine to Sit;Sit to Supine     Supine to sit: Mod assist;HOB elevated Sit to supine: Min assist   General bed mobility comments: moderate assist to elevate trunk to EOB, VCs for sequencing and tactile cues for hand placement and positioning, assist to eleavet LEs back into bed  Transfers Overall transfer level: Needs assistance Equipment used: 2 person hand held assist Transfers: Sit to/from [facility] Sit to Stand: Min assist;+2 physical assistance Stand pivot transfers: Min assist;+2 physical assistance       General transfer comment: 2 person HHA  for stability, patient able to power up to standing and transfer to/from bed/BSC. VCs for hand palcement and positioningt  Ambulation/Gait             General Gait Details: declining ambulation at this time   Stairs            Wheelchair Mobility    Modified Rankin (Stroke Patients Only)       Balance   Sitting-balance support: Feet supported Sitting balance-Leahy Scale: Fair Sitting balance - Comments: sat EOB and on BSC >10 minutes, patient assisted with hygiene and pericare, patient cued with tactile and hand over hand assist for upper body bathing while on BSC.     Standing balance-Leahy Scale: Poor                 High Level Balance Comments: cues for safety    Cognition Arousal/Alertness: Awake/alert Behavior During Therapy: Agitated Overall Cognitive Status: Impaired/Different from baseline Area of Impairment: Attention;Following commands;Safety/judgement;Awareness;Problem solving   Current Attention Level: Sustained;Selective Memory: Decreased short-term memory   Safety/Judgement: Decreased awareness of safety;Decreased awareness of deficits   Problem Solving: Slow processing;Decreased initiation;Requires verbal cues;Requires tactile cues;Difficulty sequencing      Exercises      General Comments        Pertinent Vitals/Pain Pain Assessment: No/denies pain    Home Living                      Prior Function            PT Goals (current goals can now be found in the care plan section) Acute Rehab PT Goals Patient  Stated Goal: to not have to do therapy PT Goal Formulation: With patient Time For Goal Achievement: 03/30/15 Potential to Achieve Goals: Fair Progress towards PT goals: Progressing toward goals    Frequency  Min 3X/week    PT Plan Current plan remains appropriate    Co-evaluation             End of Session Equipment Utilized During Treatment: Oxygen Activity Tolerance: Patient limited by  lethargy;Patient limited by fatigue Patient left: in chair;with call bell/phone within reach;with chair alarm set     Time: 5638-9373 PT Time Calculation (min) (ACUTE ONLY): 18 min  Charges:  $Therapeutic Activity: 8-22 mins                    G CodesDuncan Dull 04/18/15, 4:06 PM Alben Deeds, [place] DPT  [phone]

## 2015-03-19 NOTE — Clinical Social Work Note (Signed)
CSW received phone call from patient's POA [name] about bed offers for patient.  CSW presented bed offers for patient, and patient chooses [facility].  CSW contacted [facility] who said they can take patient on Sunday or Monday once he is medically ready and discharge orders have been received.  FL2 and DNR on patient's chart waiting for signature of physician.  Jones Broom. [place], MSW, [place] 03/19/2015 4:00 PM

## 2015-03-19 NOTE — Progress Notes (Signed)
Notified Dr. Wyline Copas that pt's POA called and wanted info r/t going him today.  Also per pt informing POA.  Will continue to monitor.  Karie Kirks, Therapist, sports.

## 2015-03-19 NOTE — Consult Note (Signed)
ANTIBIOTIC CONSULT NOTE - INITIAL  Pharmacy Consult for Cefepime Indication: UTI  Allergies  Allergen Reactions  . Cholestatin Other (See Comments)    Per [facility]    Patient Measurements: Height: 5\' 11"  (180.3 cm) Weight: 158 lb 8.2 oz (71.9 kg) IBW/kg (Calculated) : 75.3  Vital Signs: Temp: 98.8 F (37.1 C) (04/01 1437) Temp Source: Oral (04/01 1437) BP: 135/64 mmHg (04/01 1553) Pulse Rate: 63 (04/01 1437) Intake/Output from previous day: 03/31 0701 - 04/01 0700 In: 1830 [P.O.:1830] Out: 500 [Urine:500] Intake/Output from this shift: Total I/O In: 1195 [P.O.:1195] Out: 300 [Urine:300]  Labs:  Recent Labs  03/17/15 0335 03/18/15 0601 03/19/15 0355  WBC 4.3 3.8* 4.1  HGB 12.0* 11.4* 11.7*  PLT 117* 116* 154  CREATININE 1.40* 1.25 1.27   Estimated Creatinine Clearance: 48.8 mL/min (by C-G formula based on Cr of 1.27).   Microbiology: Recent Results (from the past 720 hour(s))  Urine culture     Status: None   Collection Time: 03/12/15  8:26 AM  Result Value Ref Range Status   Specimen Description URINE, RANDOM  Final   Special Requests NONE  Final   Colony Count   Final    >=100,000 COLONIES/ML Performed at [facility]    Culture   Final    KLEBSIELLA PNEUMONIAE Note: Two isolates with different morphologies were identified as the same organism.The most resistant organism was reported. Performed at [facility]    Report Status 03/15/2015 FINAL  Final   Organism ID, Bacteria KLEBSIELLA PNEUMONIAE  Final      Susceptibility   Klebsiella pneumoniae - MIC*    AMPICILLIN RESISTANT      CEFAZOLIN <=4 SENSITIVE Sensitive     CEFTRIAXONE <=1 SENSITIVE Sensitive     CIPROFLOXACIN <=0.25 SENSITIVE Sensitive     GENTAMICIN <=1 SENSITIVE Sensitive     LEVOFLOXACIN <=0.12 SENSITIVE Sensitive     NITROFURANTOIN 32 SENSITIVE Sensitive     TOBRAMYCIN <=1 SENSITIVE Sensitive     TRIMETH/SULFA <=20 SENSITIVE Sensitive     PIP/TAZO <=4 SENSITIVE  Sensitive     * KLEBSIELLA PNEUMONIAE  Urine culture     Status: None   Collection Time: 03/16/15  4:03 AM  Result Value Ref Range Status   Specimen Description URINE, RANDOM  Final   Special Requests ADD [id number]  Final   Colony Count   Final    >=100,000 COLONIES/ML Performed at [facility]    Culture   Final    PSEUDOMONAS AERUGINOSA Performed at [facility]    Report Status 03/18/2015 FINAL  Final   Organism ID, Bacteria PSEUDOMONAS AERUGINOSA  Final      Susceptibility   Pseudomonas aeruginosa - MIC*    CEFEPIME 2 SENSITIVE Sensitive     CEFTAZIDIME 4 SENSITIVE Sensitive     CIPROFLOXACIN <=0.25 SENSITIVE Sensitive     GENTAMICIN <=1 SENSITIVE Sensitive     IMIPENEM 2 SENSITIVE Sensitive     PIP/TAZO 8 SENSITIVE Sensitive     TOBRAMYCIN <=1 SENSITIVE Sensitive     * PSEUDOMONAS AERUGINOSA  Blood culture (routine x 2)     Status: None (Preliminary result)   Collection Time: 03/16/15  4:20 AM  Result Value Ref Range Status   Specimen Description BLOOD RIGHT ARM  Final   Special Requests BOTTLES DRAWN AEROBIC AND ANAEROBIC 10CC EACH  Final   Culture   Final           BLOOD CULTURE RECEIVED NO GROWTH  TO DATE CULTURE WILL BE HELD FOR 5 DAYS BEFORE ISSUING A FINAL NEGATIVE REPORT Note: Culture results may be compromised due to an excessive volume of blood received in culture bottles. Performed at [facility]    Report Status PENDING  Incomplete  Blood culture (routine x 2)     Status: None (Preliminary result)   Collection Time: 03/16/15  4:28 AM  Result Value Ref Range Status   Specimen Description BLOOD RIGHT HAND  Final   Special Requests BOTTLES DRAWN AEROBIC ONLY 5CC  Final   Culture   Final           BLOOD CULTURE RECEIVED NO GROWTH TO DATE CULTURE WILL BE HELD FOR 5 DAYS BEFORE ISSUING A FINAL NEGATIVE REPORT Performed at [facility]    Report Status PENDING  Incomplete  MRSA PCR Screening     Status: None   Collection  Time: 03/16/15  8:15 AM  Result Value Ref Range Status   MRSA by PCR NEGATIVE NEGATIVE Final    Comment:        The GeneXpert MRSA Assay (FDA approved for NASAL specimens only), is one component of a comprehensive MRSA colonization surveillance program. It is not intended to diagnose MRSA infection nor to guide or monitor treatment for MRSA infections.     Medical History: Past Medical History  Diagnosis Date  . Hearing aid worn   . Hypertension   . Parkinson disease   . Pneumonia   . Diabetes mellitus type II   . Arthritis   . Bipolar disorder   . Anxiety   . Depression   . MRSA infection (methicillin-resistant Staphylococcus aureus)   . Chronic indwelling Foley catheter   . C2 cervical fracture     due to pt fall  . SIRS (systemic inflammatory response syndrome)   . Memory loss   . Benign prostate hyperplasia   . Memory difficulty 08/21/2014  . Neuromuscular disorder     parkinsons   Assessment: 80yom with >100k pseudomonas (S-cefepime,ceftaz,cipro,gent,imipenem,zosyn) in his urine to begin cefepime. He had a prior klebsiella UTI on 3/25 and received fosfomycin 3gm x 1 on 3/29. He does have renal insufficiency with sCr 1.27 and CrCl ~52ml/min.  Goal of Therapy:  Appropriate dosing  Plan:  1) Cefepime 1g IV q24 2) Follow renal function and adjust as necessary  Deboraha Sprang 03/19/2015,5:36 PM

## 2015-03-19 NOTE — Progress Notes (Signed)
Pt's POA  At bedside requesting to speak with Dr. Wyline Copas.  Text message sent to MD .  Will continue to be monitored.  Karie Kirks, Therapist, sports.

## 2015-03-19 NOTE — Progress Notes (Signed)
Pt's  POA called and wanted to know if pt was being d/c today and informed her there is no order currently and will ask MD and SW to give her a call.  POA stated that she is going to come in and speak with MD or sw.  Will continue to monitor.  Karie Kirks, Therapist, sports.

## 2015-03-19 NOTE — Progress Notes (Signed)
[facility] PROGRESS NOTE  Tydus Sanmiguel Cowley QJJ:[id number] DOB: May 03, 1935 DOA: 03/16/2015 PCP: No primary care provider on file.  Assessment/Plan: Multiple falls in the setting of Parkinsons Disease. -Could be a natural progression of his Parkinson's Dz vs weakness from acute flu.Pt to follow up with Dr. Jannifer Franklin ASAP after D/C -PT/OT consulted. Recs for SNF noted, awaiting placement  Fever with H1N1 Influenza. -Pt is found to be flu positive, H1N1 -On tamiflu  Pulmonary vascular congestion -Noted on Xray.  -Patient's last echo in 2015 showed grade 1 diastolic dysfunction.  Non Productive Cough with mild wheezing with hypoxia -Will continue mucinex, schedule nebs. -Pt remains on 4L [place], baseline o2 at [facility], thus hypoxia is worsened over baseline -Had started solumedrol IV 60mg  q12hrs, will increase to 60mg  q6hrs as pt continues to wheeze -Cont to wean O2 as toelrated  Sacral decubitus - multiple skin abrasions. -WOC requested. Recommendations appreciated.  Dementia with acute delirium  -Discussed with caregiver. Patient's mental status is worse over baseline. Currently, pt is not oriented. - Suspect acute toxic-metabolic encephalopathy related to flu - Improving  DVT Prophylaxis: SCDs  Code Status: DNR Family Communication: Pt in room, discussed case with pt's caregiver at bedside Disposition Plan: Pending SNF placement   Consultants:    Procedures:    Antibiotics:  Tamiflu 3/29>>>   HPI/Subjective: Pt without complaints.  Objective: Filed Vitals:   03/19/15 0605 03/19/15 0749 03/19/15 1101 03/19/15 1437  BP: 139/61  107/66 130/69  Pulse: 53   63  Temp: 98 F (36.7 C)   98.8 F (37.1 C)  TempSrc: Oral   Oral  Resp: 18   16  Height:      Weight: 71.9 kg (158 lb 8.2 oz)     SpO2: 96% 96%  95%    Intake/Output Summary (Last 24 hours) at 03/19/15 1518 Last data filed at 03/19/15 1437  Gross per 24 hour  Intake   1537 ml  Output    600 ml   Net    937 ml   Filed Weights   03/17/15 0445 03/18/15 0501 03/19/15 0605  Weight: 69.7 kg (153 lb 10.6 oz) 69.3 kg (152 lb 12.5 oz) 71.9 kg (158 lb 8.2 oz)    Exam:   General:  Awake, in nad  Cardiovascular: regular, s1, s2  Respiratory: normal resp effort, end-expiratory wheezing throughout  Abdomen: soft, nondistended  Musculoskeletal: perfused, no clubbing   Data Reviewed: Basic Metabolic Panel:  Recent Labs Lab 03/16/15 0403 03/16/15 0416 03/17/15 0335 03/18/15 0601 03/19/15 0355  NA 137  --  138 138 141  K 3.7  --  3.6 4.0 5.2*  CL 105  --  103 105 104  CO2 21  --  29 25 33*  GLUCOSE 115*  --  93 100* 151*  BUN 30*  --  23 25* 34*  CREATININE 1.33  --  1.40* 1.25 1.27  CALCIUM 8.5  --  8.1* 8.3* 9.0  MG  --  2.0  --   --   --    Liver Function Tests:  Recent Labs Lab 03/16/15 0403  AST 54*  ALT 22  ALKPHOS 64  BILITOT 0.7  PROT 4.9*  ALBUMIN 3.4*   No results for input(s): LIPASE, AMYLASE in the last 168 hours. No results for input(s): AMMONIA in the last 168 hours. CBC:  Recent Labs Lab 03/16/15 0403 03/17/15 0335 03/18/15 0601 03/19/15 0355  WBC 4.0 4.3 3.8* 4.1  HGB 13.1 12.0* 11.4* 11.7*  HCT  39.3 36.3* 34.6* 36.2*  MCV 89.5 90.5 90.1 91.4  PLT 125* 117* 116* 154   Cardiac Enzymes: No results for input(s): CKTOTAL, CKMB, CKMBINDEX, TROPONINI in the last 168 hours. BNP (last 3 results) No results for input(s): BNP in the last 8760 hours.  ProBNP (last 3 results)  Recent Labs  04/17/14 2316  PROBNP 1227.0*    CBG:  Recent Labs Lab 03/18/15 1140 03/18/15 1601 03/18/15 2124 03/19/15 0705 03/19/15 1144  GLUCAP 185* 235* 158* 111* 94    Recent Results (from the past 240 hour(s))  Urine culture     Status: None   Collection Time: 03/12/15  8:26 AM  Result Value Ref Range Status   Specimen Description URINE, RANDOM  Final   Special Requests NONE  Final   Colony Count   Final    >=100,000 COLONIES/ML Performed at  [facility]    Culture   Final    KLEBSIELLA PNEUMONIAE Note: Two isolates with different morphologies were identified as the same organism.The most resistant organism was reported. Performed at [facility]    Report Status 03/15/2015 FINAL  Final   Organism ID, Bacteria KLEBSIELLA PNEUMONIAE  Final      Susceptibility   Klebsiella pneumoniae - MIC*    AMPICILLIN RESISTANT      CEFAZOLIN <=4 SENSITIVE Sensitive     CEFTRIAXONE <=1 SENSITIVE Sensitive     CIPROFLOXACIN <=0.25 SENSITIVE Sensitive     GENTAMICIN <=1 SENSITIVE Sensitive     LEVOFLOXACIN <=0.12 SENSITIVE Sensitive     NITROFURANTOIN 32 SENSITIVE Sensitive     TOBRAMYCIN <=1 SENSITIVE Sensitive     TRIMETH/SULFA <=20 SENSITIVE Sensitive     PIP/TAZO <=4 SENSITIVE Sensitive     * KLEBSIELLA PNEUMONIAE  Urine culture     Status: None   Collection Time: 03/16/15  4:03 AM  Result Value Ref Range Status   Specimen Description URINE, RANDOM  Final   Special Requests ADD [id number]  Final   Colony Count   Final    >=100,000 COLONIES/ML Performed at [facility]    Culture   Final    PSEUDOMONAS AERUGINOSA Performed at [facility]    Report Status 03/18/2015 FINAL  Final   Organism ID, Bacteria PSEUDOMONAS AERUGINOSA  Final      Susceptibility   Pseudomonas aeruginosa - MIC*    CEFEPIME 2 SENSITIVE Sensitive     CEFTAZIDIME 4 SENSITIVE Sensitive     CIPROFLOXACIN <=0.25 SENSITIVE Sensitive     GENTAMICIN <=1 SENSITIVE Sensitive     IMIPENEM 2 SENSITIVE Sensitive     PIP/TAZO 8 SENSITIVE Sensitive     TOBRAMYCIN <=1 SENSITIVE Sensitive     * PSEUDOMONAS AERUGINOSA  Blood culture (routine x 2)     Status: None (Preliminary result)   Collection Time: 03/16/15  4:20 AM  Result Value Ref Range Status   Specimen Description BLOOD RIGHT ARM  Final   Special Requests BOTTLES DRAWN AEROBIC AND ANAEROBIC 10CC EACH  Final   Culture   Final           BLOOD CULTURE RECEIVED NO GROWTH TO  DATE CULTURE WILL BE HELD FOR 5 DAYS BEFORE ISSUING A FINAL NEGATIVE REPORT Note: Culture results may be compromised due to an excessive volume of blood received in culture bottles. Performed at [facility]    Report Status PENDING  Incomplete  Blood culture (routine x 2)     Status: None (Preliminary result)  Collection Time: 03/16/15  4:28 AM  Result Value Ref Range Status   Specimen Description BLOOD RIGHT HAND  Final   Special Requests BOTTLES DRAWN AEROBIC ONLY 5CC  Final   Culture   Final           BLOOD CULTURE RECEIVED NO GROWTH TO DATE CULTURE WILL BE HELD FOR 5 DAYS BEFORE ISSUING A FINAL NEGATIVE REPORT Performed at [facility]    Report Status PENDING  Incomplete  MRSA PCR Screening     Status: None   Collection Time: 03/16/15  8:15 AM  Result Value Ref Range Status   MRSA by PCR NEGATIVE NEGATIVE Final    Comment:        The GeneXpert MRSA Assay (FDA approved for NASAL specimens only), is one component of a comprehensive MRSA colonization surveillance program. It is not intended to diagnose MRSA infection nor to guide or monitor treatment for MRSA infections.      Studies: No results found.  Scheduled Meds: . amLODipine  5 mg Oral Daily  . aspirin  81 mg Oral Daily  . carbidopa-levodopa  1 tablet Oral TID  . clonazePAM  0.25 mg Oral BID  . docusate sodium  100 mg Oral BID  . entacapone  200 mg Oral TID  . feeding supplement (ENSURE ENLIVE)  237 mL Oral BID BM  . feeding supplement (GLUCERNA SHAKE)  237 mL Oral Daily  . fluticasone  2 spray Each Nare Daily  . guaiFENesin  600 mg Oral BID  . hydrALAZINE  25 mg Oral TID  . hydrocerin  1 application Topical BID  . insulin aspart  0-9 Units Subcutaneous TID WC  . ipratropium-albuterol  3 mL Nebulization Q4H  . lamoTRIgine  150 mg Oral BID  . lisinopril  10 mg Oral Daily  . methylPREDNISolone (SOLU-MEDROL) injection  60 mg Intravenous Q4H  . neomycin-bacitracin-polymyxin   Topical BID  .  oseltamivir  30 mg Oral BID  . pantoprazole  40 mg Oral Q1200  . polyethylene glycol  17 g Oral Daily  . QUEtiapine  50 mg Oral QHS  . sertraline  200 mg Oral Daily  . sodium chloride  3 mL Intravenous Q12H  . traZODone  100 mg Oral QHS   Continuous Infusions:   Principal Problem:   Fever Active Problems:   Parkinson disease   Diabetes mellitus, type 2   Bipolar disorder   Neuromuscular disorder   Chronic diastolic congestive heart failure   Memory difficulty   Fall   Sacral pressure ulcer   CHIU, STEPHEN K  [facility] Pager [phone]. If 7PM-7AM, please contact night-coverage at [email or web address], password [facility] 03/19/2015, 3:18 PM  LOS: 1 day

## 2015-03-20 LAB — GLUCOSE, CAPILLARY
GLUCOSE-CAPILLARY: 151 mg/dL — AB (ref 70–99)
GLUCOSE-CAPILLARY: 166 mg/dL — AB (ref 70–99)
Glucose-Capillary: 139 mg/dL — ABNORMAL HIGH (ref 70–99)
Glucose-Capillary: 151 mg/dL — ABNORMAL HIGH (ref 70–99)

## 2015-03-20 LAB — CBC
HCT: 35.6 % — ABNORMAL LOW (ref 39.0–52.0)
Hemoglobin: 11.7 g/dL — ABNORMAL LOW (ref 13.0–17.0)
MCH: 29.8 pg (ref 26.0–34.0)
MCHC: 32.9 g/dL (ref 30.0–36.0)
MCV: 90.6 fL (ref 78.0–100.0)
Platelets: 194 10*3/uL (ref 150–400)
RBC: 3.93 MIL/uL — ABNORMAL LOW (ref 4.22–5.81)
RDW: 14.2 % (ref 11.5–15.5)
WBC: 6.1 10*3/uL (ref 4.0–10.5)

## 2015-03-20 LAB — BASIC METABOLIC PANEL
Anion gap: 5 (ref 5–15)
BUN: 33 mg/dL — ABNORMAL HIGH (ref 6–23)
CO2: 30 mmol/L (ref 19–32)
Calcium: 8.8 mg/dL (ref 8.4–10.5)
Chloride: 105 mmol/L (ref 96–112)
Creatinine, Ser: 1.07 mg/dL (ref 0.50–1.35)
GFR, EST AFRICAN AMERICAN: 75 mL/min — AB (ref >90)
GFR, EST NON AFRICAN AMERICAN: 64 mL/min — AB (ref >90)
GLUCOSE: 150 mg/dL — AB (ref 70–99)
POTASSIUM: 5.2 mmol/L — AB (ref 3.5–5.1)
Sodium: 140 mmol/L (ref 135–145)

## 2015-03-20 MED ORDER — METHYLPREDNISOLONE SODIUM SUCC 125 MG IJ SOLR
60.0000 mg | Freq: Three times a day (TID) | INTRAMUSCULAR | Status: DC
  Administered 2015-03-20 – 2015-03-22 (×6): 60 mg via INTRAVENOUS
  Filled 2015-03-20 (×6): qty 0.96

## 2015-03-20 NOTE — Progress Notes (Signed)
[facility] PROGRESS NOTE  [name] VEH:[id number] DOB: [birth year] DOA: 03/16/2015 PCP: No primary care provider on file.  Assessment/Plan: Multiple falls in the setting of Parkinsons Disease. -Could be a natural progression of his Parkinson's Dz vs weakness from acute flu.Pt to follow up with Dr. Jannifer Franklin ASAP after D/C -PT/OT consulted. Recs for SNF noted, awaiting placement  Fever with H1N1 Influenza. -Pt is found to be flu positive, H1N1 -On tamiflu  Pulmonary vascular congestion -Noted on Xray.  -Patient's last echo in 2015 showed grade 1 diastolic dysfunction.  Non Productive Cough with mild wheezing with hypoxia -Will continue mucinex, schedule nebs. -O2 now down to [place] -On solumedrol 60mg  q6hrs as pt continues to wheeze -Cont to wean O2 as toelrated  Sacral decubitus - multiple skin abrasions. -WOC requested. Recommendations appreciated.  Dementia with acute delirium  -Discussed with caregiver. Patient's mental status is worse over baseline. Currently, pt is not oriented. - Suspect acute toxic-metabolic encephalopathy related to flu - Improving  DVT Prophylaxis: SCDs  Pseudomonas UTI: - Ono cefepime - Will change out foley catheter  Code Status: DNR Family Communication: Pt in room, discussed case with pt's caregiver at bedside Disposition Plan: Pending SNF placement   Consultants:    Procedures:    Antibiotics:  Tamiflu 3/29>>>   Cefpime 4/1>>>>  HPI/Subjective: Feels better today. No acute events.  Objective: Filed Vitals:   03/20/15 0524 03/20/15 0802 03/20/15 1114 03/20/15 1441  BP: 132/66  112/60   Pulse: 54  57   Temp: 98.3 F (36.8 C)  97.8 F (36.6 C)   TempSrc: Oral  Oral   Resp: 18  18   Height:      Weight: 70.903 kg (156 lb 5 oz)     SpO2: 98% 98% 97% 98%    Intake/Output Summary (Last 24 hours) at 03/20/15 1539 Last data filed at 03/20/15 1200  Gross per 24 hour  Intake   1347 ml  Output   1000 ml   Net    347 ml   Filed Weights   03/18/15 0501 03/19/15 0605 03/20/15 0524  Weight: 69.3 kg (152 lb 12.5 oz) 71.9 kg (158 lb 8.2 oz) 70.903 kg (156 lb 5 oz)    Exam:   General:  Awake, in nad  Cardiovascular: regular, s1, s2  Respiratory: normal resp effort, end-expiratory wheezing throughout  Abdomen: soft, nondistended  Musculoskeletal: perfused, no clubbing   Data Reviewed: Basic Metabolic Panel:  Recent Labs Lab 03/16/15 0403 03/16/15 0416 03/17/15 0335 03/18/15 0601 03/19/15 0355 03/20/15 0315  NA 137  --  138 138 141 140  Carpenter 3.7  --  3.6 4.0 5.2* 5.2*  CL 105  --  103 105 104 105  CO2 21  --  29 25 33* 30  GLUCOSE 115*  --  93 100* 151* 150*  BUN 30*  --  23 25* 34* 33*  CREATININE 1.33  --  1.40* 1.25 1.27 1.07  CALCIUM 8.5  --  8.1* 8.3* 9.0 8.8  MG  --  2.0  --   --   --   --    Liver Function Tests:  Recent Labs Lab 03/16/15 0403  AST 54*  ALT 22  ALKPHOS 64  BILITOT 0.7  PROT 4.9*  ALBUMIN 3.4*   No results for input(s): LIPASE, AMYLASE in the last 168 hours. No results for input(s): AMMONIA in the last 168 hours. CBC:  Recent Labs Lab 03/16/15 0403 03/17/15 0335 03/18/15 0601 03/19/15 0355 03/20/15  0315  WBC 4.0 4.3 3.8* 4.1 6.1  HGB 13.1 12.0* 11.4* 11.7* 11.7*  HCT 39.3 36.3* 34.6* 36.2* 35.6*  MCV 89.5 90.5 90.1 91.4 90.6  PLT 125* 117* 116* 154 194   Cardiac Enzymes: No results for input(s): CKTOTAL, CKMB, CKMBINDEX, TROPONINI in the last 168 hours. BNP (last 3 results) No results for input(s): BNP in the last 8760 hours.  ProBNP (last 3 results)  Recent Labs  04/17/14 2316  PROBNP 1227.0*    CBG:  Recent Labs Lab 03/19/15 1144 03/19/15 1622 03/19/15 2055 03/20/15 0622 03/20/15 1156  GLUCAP 94 168* 183* 151* 166*    Recent Results (from the past 240 hour(s))  Urine culture     Status: None   Collection Time: 03/12/15  8:26 AM  Result Value Ref Range Status   Specimen Description URINE, RANDOM  Final    Special Requests NONE  Final   Colony Count   Final    >=100,000 COLONIES/ML Performed at [facility]    Culture   Final    KLEBSIELLA PNEUMONIAE Note: Two isolates with different morphologies were identified as the same organism.The most resistant organism was reported. Performed at [facility]    Report Status 03/15/2015 FINAL  Final   Organism ID, Bacteria KLEBSIELLA PNEUMONIAE  Final      Susceptibility   Klebsiella pneumoniae - MIC*    AMPICILLIN RESISTANT      CEFAZOLIN <=4 SENSITIVE Sensitive     CEFTRIAXONE <=1 SENSITIVE Sensitive     CIPROFLOXACIN <=0.25 SENSITIVE Sensitive     GENTAMICIN <=1 SENSITIVE Sensitive     LEVOFLOXACIN <=0.12 SENSITIVE Sensitive     NITROFURANTOIN 32 SENSITIVE Sensitive     TOBRAMYCIN <=1 SENSITIVE Sensitive     TRIMETH/SULFA <=20 SENSITIVE Sensitive     PIP/TAZO <=4 SENSITIVE Sensitive     * KLEBSIELLA PNEUMONIAE  Urine culture     Status: None   Collection Time: 03/16/15  4:03 AM  Result Value Ref Range Status   Specimen Description URINE, RANDOM  Final   Special Requests ADD [id number]  Final   Colony Count   Final    >=100,000 COLONIES/ML Performed at [facility]    Culture   Final    PSEUDOMONAS AERUGINOSA Performed at [facility]    Report Status 03/18/2015 FINAL  Final   Organism ID, Bacteria PSEUDOMONAS AERUGINOSA  Final      Susceptibility   Pseudomonas aeruginosa - MIC*    CEFEPIME 2 SENSITIVE Sensitive     CEFTAZIDIME 4 SENSITIVE Sensitive     CIPROFLOXACIN <=0.25 SENSITIVE Sensitive     GENTAMICIN <=1 SENSITIVE Sensitive     IMIPENEM 2 SENSITIVE Sensitive     PIP/TAZO 8 SENSITIVE Sensitive     TOBRAMYCIN <=1 SENSITIVE Sensitive     * PSEUDOMONAS AERUGINOSA  Blood culture (routine x 2)     Status: None (Preliminary result)   Collection Time: 03/16/15  4:20 AM  Result Value Ref Range Status   Specimen Description BLOOD RIGHT ARM  Final   Special Requests BOTTLES DRAWN AEROBIC AND  ANAEROBIC 10CC EACH  Final   Culture   Final           BLOOD CULTURE RECEIVED NO GROWTH TO DATE CULTURE WILL BE HELD FOR 5 DAYS BEFORE ISSUING A FINAL NEGATIVE REPORT Note: Culture results may be compromised due to an excessive volume of blood received in culture bottles. Performed at [facility]    Report  Status PENDING  Incomplete  Blood culture (routine x 2)     Status: None (Preliminary result)   Collection Time: 03/16/15  4:28 AM  Result Value Ref Range Status   Specimen Description BLOOD RIGHT HAND  Final   Special Requests BOTTLES DRAWN AEROBIC ONLY 5CC  Final   Culture   Final           BLOOD CULTURE RECEIVED NO GROWTH TO DATE CULTURE WILL BE HELD FOR 5 DAYS BEFORE ISSUING A FINAL NEGATIVE REPORT Performed at [facility]    Report Status PENDING  Incomplete  MRSA PCR Screening     Status: None   Collection Time: 03/16/15  8:15 AM  Result Value Ref Range Status   MRSA by PCR NEGATIVE NEGATIVE Final    Comment:        The GeneXpert MRSA Assay (FDA approved for NASAL specimens only), is one component of a comprehensive MRSA colonization surveillance program. It is not intended to diagnose MRSA infection nor to guide or monitor treatment for MRSA infections.      Studies: No results found.  Scheduled Meds: . amLODipine  5 mg Oral Daily  . aspirin  81 mg Oral Daily  . carbidopa-levodopa  1 tablet Oral TID  . ceFEPime (MAXIPIME) IV  1 g Intravenous Q24H  . clonazePAM  0.25 mg Oral BID  . docusate sodium  100 mg Oral BID  . entacapone  200 mg Oral TID  . feeding supplement (ENSURE ENLIVE)  237 mL Oral BID BM  . feeding supplement (GLUCERNA SHAKE)  237 mL Oral Daily  . fluticasone  2 spray Each Nare Daily  . guaiFENesin  600 mg Oral BID  . hydrALAZINE  25 mg Oral TID  . hydrocerin  1 application Topical BID  . insulin aspart  0-9 Units Subcutaneous TID WC  . ipratropium-albuterol  3 mL Nebulization TID  . lamoTRIgine  150 mg Oral BID  .  lisinopril  10 mg Oral Daily  . methylPREDNISolone (SOLU-MEDROL) injection  60 mg Intravenous Q8H  . neomycin-bacitracin-polymyxin   Topical BID  . oseltamivir  30 mg Oral BID  . pantoprazole  40 mg Oral Q1200  . polyethylene glycol  17 g Oral Daily  . QUEtiapine  50 mg Oral QHS  . sertraline  200 mg Oral Daily  . sodium chloride  3 mL Intravenous Q12H  . traZODone  100 mg Oral QHS   Continuous Infusions:   Principal Problem:   Fever Active Problems:   Parkinson disease   Diabetes mellitus, type 2   Bipolar disorder   Neuromuscular disorder   Chronic diastolic congestive heart failure   Memory difficulty   Fall   Sacral pressure ulcer   [name]  [facility] Pager [phone]. If 7PM-7AM, please contact night-coverage at [email or web address], password [facility] 03/20/2015, 3:39 PM  LOS: 2 days

## 2015-03-21 DIAGNOSIS — L89152 Pressure ulcer of sacral region, stage 2: Secondary | ICD-10-CM

## 2015-03-21 LAB — GLUCOSE, CAPILLARY
GLUCOSE-CAPILLARY: 132 mg/dL — AB (ref 70–99)
GLUCOSE-CAPILLARY: 161 mg/dL — AB (ref 70–99)
Glucose-Capillary: 132 mg/dL — ABNORMAL HIGH (ref 70–99)
Glucose-Capillary: 150 mg/dL — ABNORMAL HIGH (ref 70–99)

## 2015-03-21 LAB — BASIC METABOLIC PANEL
ANION GAP: 9 (ref 5–15)
BUN: 37 mg/dL — AB (ref 6–23)
CALCIUM: 8.8 mg/dL (ref 8.4–10.5)
CO2: 27 mmol/L (ref 19–32)
CREATININE: 1.2 mg/dL (ref 0.50–1.35)
Chloride: 101 mmol/L (ref 96–112)
GFR calc non Af Amer: 56 mL/min — ABNORMAL LOW (ref >90)
GFR, EST AFRICAN AMERICAN: 65 mL/min — AB (ref >90)
GLUCOSE: 147 mg/dL — AB (ref 70–99)
Potassium: 5.3 mmol/L — ABNORMAL HIGH (ref 3.5–5.1)
Sodium: 137 mmol/L (ref 135–145)

## 2015-03-21 MED ORDER — HYDROCOD POLST-CHLORPHEN POLST 10-8 MG/5ML PO LQCR: 5.0000 mL | Freq: Two times a day (BID) | ORAL | Status: DC | PRN

## 2015-03-21 MED ORDER — IPRATROPIUM-ALBUTEROL 0.5-2.5 (3) MG/3ML IN SOLN
3.0000 mL | Freq: Two times a day (BID) | RESPIRATORY_TRACT | Status: DC
  Administered 2015-03-22: 3 mL via RESPIRATORY_TRACT
  Filled 2015-03-21: qty 3

## 2015-03-21 NOTE — Progress Notes (Addendum)
[facility] PROGRESS NOTE  [name] QAS:[id number] DOB: 07-12-1935 DOA: 03/16/2015 PCP: No primary care provider on file.  Assessment/Plan: Multiple falls in the setting of Parkinsons Disease. -Could be a natural progression of his Parkinson's Dz vs weakness from acute flu.Pt to follow up with Dr. Jannifer Franklin ASAP after D/C -PT/OT consulted. Recs for SNF noted, awaiting placement  Fever with H1N1 Influenza. -Pt is found to be flu positive, H1N1 -On tamiflu  Pulmonary vascular congestion -Noted on Xray.  -Patient's last echo in 2015 showed grade 1 diastolic dysfunction.  Non Productive Cough with mild wheezing with hypoxia -Will continue mucinex, schedule nebs. -wheezing improved. Wean steroids as tolerated -Pt now on min O2 support  Sacral decubitus - multiple skin abrasions. -WOC requested. Recommendations appreciated.  Dementia with acute delirium - Suspect acute toxic-metabolic encephalopathy related to flu - Much improved and oriented.  DVT Prophylaxis: SCDs  Pseudomonas UTI: - Ono cefepime - Will change out foley catheter  Hyperkalemia - borderline elevated K - Will give trial of holding lisinopril  Code Status: DNR Family Communication: Pt in room Disposition Plan: Pending SNF placement   Consultants:    Procedures:    Antibiotics:  Tamiflu 3/29>>>   Cefpime 4/1>>>>  HPI/Subjective: No complaints. Eager to go to therapy.  Objective: Filed Vitals:   03/20/15 1947 03/20/15 2053 03/21/15 0510 03/21/15 0931  BP: 152/66  111/63 128/66  Pulse: 60  55 59  Temp: 98.3 F (36.8 C)  98.2 F (36.8 C)   TempSrc: Oral  Oral   Resp: 20  18   Height:      Weight:   70.876 kg (156 lb 4.1 oz)   SpO2: 95% 96% 95%     Intake/Output Summary (Last 24 hours) at 03/21/15 1401 Last data filed at 03/21/15 1100  Gross per 24 hour  Intake    630 ml  Output   1100 ml  Net   -470 ml   Filed Weights   03/19/15 0605 03/20/15 0524 03/21/15 0510   Weight: 71.9 kg (158 lb 8.2 oz) 70.903 kg (156 lb 5 oz) 70.876 kg (156 lb 4.1 oz)    Exam:   General:  Awake, in nad  Cardiovascular: regular, s1, s2  Respiratory: normal resp effort, end-expiratory wheezing throughout  Abdomen: soft, nondistended  Musculoskeletal: perfused, no clubbing   Data Reviewed: Basic Metabolic Panel:  Recent Labs Lab 03/16/15 0416 03/17/15 0335 03/18/15 0601 03/19/15 0355 03/20/15 0315 03/21/15 0440  NA  --  138 138 141 140 137  K  --  3.6 4.0 5.2* 5.2* 5.3*  CL  --  103 105 104 105 101  CO2  --  29 25 33* 30 27  GLUCOSE  --  93 100* 151* 150* 147*  BUN  --  23 25* 34* 33* 37*  CREATININE  --  1.40* 1.25 1.27 1.07 1.20  CALCIUM  --  8.1* 8.3* 9.0 8.8 8.8  MG 2.0  --   --   --   --   --    Liver Function Tests:  Recent Labs Lab 03/16/15 0403  AST 54*  ALT 22  ALKPHOS 64  BILITOT 0.7  PROT 4.9*  ALBUMIN 3.4*   No results for input(s): LIPASE, AMYLASE in the last 168 hours. No results for input(s): AMMONIA in the last 168 hours. CBC:  Recent Labs Lab 03/16/15 0403 03/17/15 0335 03/18/15 0601 03/19/15 0355 03/20/15 0315  WBC 4.0 4.3 3.8* 4.1 6.1  HGB 13.1 12.0* 11.4* 11.7*  11.7*  HCT 39.3 36.3* 34.6* 36.2* 35.6*  MCV 89.5 90.5 90.1 91.4 90.6  PLT 125* 117* 116* 154 194   Cardiac Enzymes: No results for input(s): CKTOTAL, CKMB, CKMBINDEX, TROPONINI in the last 168 hours. BNP (last 3 results) No results for input(s): BNP in the last 8760 hours.  ProBNP (last 3 results)  Recent Labs  04/17/14 2316  PROBNP 1227.0*    CBG:  Recent Labs Lab 03/20/15 1156 03/20/15 1621 03/20/15 2106 03/21/15 0611 03/21/15 1142  GLUCAP 166* 139* 151* 132* 132*    Recent Results (from the past 240 hour(s))  Urine culture     Status: None   Collection Time: 03/12/15  8:26 AM  Result Value Ref Range Status   Specimen Description URINE, RANDOM  Final   Special Requests NONE  Final   Colony Count   Final    >=100,000  COLONIES/ML Performed at [facility]    Culture   Final    KLEBSIELLA PNEUMONIAE Note: Two isolates with different morphologies were identified as the same organism.The most resistant organism was reported. Performed at [facility]    Report Status 03/15/2015 FINAL  Final   Organism ID, Bacteria KLEBSIELLA PNEUMONIAE  Final      Susceptibility   Klebsiella pneumoniae - MIC*    AMPICILLIN RESISTANT      CEFAZOLIN <=4 SENSITIVE Sensitive     CEFTRIAXONE <=1 SENSITIVE Sensitive     CIPROFLOXACIN <=0.25 SENSITIVE Sensitive     GENTAMICIN <=1 SENSITIVE Sensitive     LEVOFLOXACIN <=0.12 SENSITIVE Sensitive     NITROFURANTOIN 32 SENSITIVE Sensitive     TOBRAMYCIN <=1 SENSITIVE Sensitive     TRIMETH/SULFA <=20 SENSITIVE Sensitive     PIP/TAZO <=4 SENSITIVE Sensitive     * KLEBSIELLA PNEUMONIAE  Urine culture     Status: None   Collection Time: 03/16/15  4:03 AM  Result Value Ref Range Status   Specimen Description URINE, RANDOM  Final   Special Requests ADD [id number]  Final   Colony Count   Final    >=100,000 COLONIES/ML Performed at [facility]    Culture   Final    PSEUDOMONAS AERUGINOSA Performed at [facility]    Report Status 03/18/2015 FINAL  Final   Organism ID, Bacteria PSEUDOMONAS AERUGINOSA  Final      Susceptibility   Pseudomonas aeruginosa - MIC*    CEFEPIME 2 SENSITIVE Sensitive     CEFTAZIDIME 4 SENSITIVE Sensitive     CIPROFLOXACIN <=0.25 SENSITIVE Sensitive     GENTAMICIN <=1 SENSITIVE Sensitive     IMIPENEM 2 SENSITIVE Sensitive     PIP/TAZO 8 SENSITIVE Sensitive     TOBRAMYCIN <=1 SENSITIVE Sensitive     * PSEUDOMONAS AERUGINOSA  Blood culture (routine x 2)     Status: None (Preliminary result)   Collection Time: 03/16/15  4:20 AM  Result Value Ref Range Status   Specimen Description BLOOD RIGHT ARM  Final   Special Requests BOTTLES DRAWN AEROBIC AND ANAEROBIC 10CC EACH  Final   Culture   Final           BLOOD  CULTURE RECEIVED NO GROWTH TO DATE CULTURE WILL BE HELD FOR 5 DAYS BEFORE ISSUING A FINAL NEGATIVE REPORT Note: Culture results may be compromised due to an excessive volume of blood received in culture bottles. Performed at [facility]    Report Status PENDING  Incomplete  Blood culture (routine x 2)  Status: None (Preliminary result)   Collection Time: 03/16/15  4:28 AM  Result Value Ref Range Status   Specimen Description BLOOD RIGHT HAND  Final   Special Requests BOTTLES DRAWN AEROBIC ONLY 5CC  Final   Culture   Final           BLOOD CULTURE RECEIVED NO GROWTH TO DATE CULTURE WILL BE HELD FOR 5 DAYS BEFORE ISSUING A FINAL NEGATIVE REPORT Performed at [facility]    Report Status PENDING  Incomplete  MRSA PCR Screening     Status: None   Collection Time: 03/16/15  8:15 AM  Result Value Ref Range Status   MRSA by PCR NEGATIVE NEGATIVE Final    Comment:        The GeneXpert MRSA Assay (FDA approved for NASAL specimens only), is one component of a comprehensive MRSA colonization surveillance program. It is not intended to diagnose MRSA infection nor to guide or monitor treatment for MRSA infections.      Studies: No results found.  Scheduled Meds: . amLODipine  5 mg Oral Daily  . aspirin  81 mg Oral Daily  . carbidopa-levodopa  1 tablet Oral TID  . ceFEPime (MAXIPIME) IV  1 g Intravenous Q24H  . clonazePAM  0.25 mg Oral BID  . docusate sodium  100 mg Oral BID  . entacapone  200 mg Oral TID  . feeding supplement (ENSURE ENLIVE)  237 mL Oral BID BM  . feeding supplement (GLUCERNA SHAKE)  237 mL Oral Daily  . fluticasone  2 spray Each Nare Daily  . guaiFENesin  600 mg Oral BID  . hydrALAZINE  25 mg Oral TID  . hydrocerin  1 application Topical BID  . insulin aspart  0-9 Units Subcutaneous TID WC  . ipratropium-albuterol  3 mL Nebulization TID  . lamoTRIgine  150 mg Oral BID  . lisinopril  10 mg Oral Daily  . methylPREDNISolone (SOLU-MEDROL)  injection  60 mg Intravenous Q8H  . neomycin-bacitracin-polymyxin   Topical BID  . pantoprazole  40 mg Oral Q1200  . polyethylene glycol  17 g Oral Daily  . QUEtiapine  50 mg Oral QHS  . sertraline  200 mg Oral Daily  . sodium chloride  3 mL Intravenous Q12H  . traZODone  100 mg Oral QHS   Continuous Infusions:   Principal Problem:   Fever Active Problems:   Parkinson disease   Diabetes mellitus, type 2   Bipolar disorder   Neuromuscular disorder   Chronic diastolic congestive heart failure   Memory difficulty   Fall   Sacral pressure ulcer   Grabiela Wohlford K  [facility] Pager [phone]. If 7PM-7AM, please contact night-coverage at [email or web address], password [facility] 03/21/2015, 2:01 PM  LOS: 3 days

## 2015-03-21 NOTE — Clinical Documentation Improvement (Signed)
MDs, NP's, and PA's  Noted nutritional evaluation of patient "Inadequate oral intake related to limited food preferences as evidenced by pt's report and <20% meal completion"   please document clinical  signifcance if appropriate for this admission.  Thank you     Possible Clinical Conditions?  Severe Malnutrition   Protein Calorie Malnutrition Severe Protein Calorie Malnutrition Moderate Malnutrition Emaciation  Cachexia    Other Condition Cannot clinically determine    Monitor : Nutritional evaluation  Thank You, Ree Kida ,RN Clinical Documentation Specialist:  [facility]

## 2015-03-21 NOTE — Clinical Documentation Improvement (Signed)
  MD's, NP's, and PA's   Patient has recent history of "chronic foley" which he pulled  out prior to admit a cause and effect relationship may not be assumed and must be documented by a provider. Please clarify the relationship, if any, between  Chronic foley and  Current UTI if appropriate. Thank you   Are the conditions:    ? UTI due to or associated with chronic foley ? UTI not due to or associated with chronic foley ? Other (please specify)  ? Unrelated to each other ? Unable to determine ? Unknown     Risk factor:  h/o chronic foley , sacral pressure ulcers Diagnostics: Urinalysis and culture Treatment: IV antibiotics  Thank you, Ree Kida ,RN Clinical Documentation Specialist:  [facility]

## 2015-03-22 LAB — CULTURE, BLOOD (ROUTINE X 2)
Culture: NO GROWTH
Culture: NO GROWTH

## 2015-03-22 LAB — CBC
HCT: 36 % — ABNORMAL LOW (ref 39.0–52.0)
Hemoglobin: 11.6 g/dL — ABNORMAL LOW (ref 13.0–17.0)
MCH: 29.1 pg (ref 26.0–34.0)
MCHC: 32.2 g/dL (ref 30.0–36.0)
MCV: 90.2 fL (ref 78.0–100.0)
PLATELETS: 279 10*3/uL (ref 150–400)
RBC: 3.99 MIL/uL — AB (ref 4.22–5.81)
RDW: 14.1 % (ref 11.5–15.5)
WBC: 10.1 10*3/uL (ref 4.0–10.5)

## 2015-03-22 LAB — BASIC METABOLIC PANEL
Anion gap: 6 (ref 5–15)
BUN: 39 mg/dL — AB (ref 6–23)
CO2: 28 mmol/L (ref 19–32)
Calcium: 8.8 mg/dL (ref 8.4–10.5)
Chloride: 105 mmol/L (ref 96–112)
Creatinine, Ser: 1.24 mg/dL (ref 0.50–1.35)
GFR calc Af Amer: 62 mL/min — ABNORMAL LOW (ref >90)
GFR, EST NON AFRICAN AMERICAN: 54 mL/min — AB (ref >90)
Glucose, Bld: 130 mg/dL — ABNORMAL HIGH (ref 70–99)
POTASSIUM: 5.4 mmol/L — AB (ref 3.5–5.1)
Sodium: 139 mmol/L (ref 135–145)

## 2015-03-22 LAB — GLUCOSE, CAPILLARY
GLUCOSE-CAPILLARY: 119 mg/dL — AB (ref 70–99)
Glucose-Capillary: 94 mg/dL (ref 70–99)

## 2015-03-22 MED ORDER — CIPROFLOXACIN HCL 500 MG PO TABS: 500.0000 mg | ORAL_TABLET | Freq: Two times a day (BID) | ORAL | Status: DC

## 2015-03-22 MED ORDER — SODIUM POLYSTYRENE SULFONATE 15 GM/60ML PO SUSP
30.0000 g | Freq: Once | ORAL | Status: AC
  Administered 2015-03-22: 30 g via ORAL
  Filled 2015-03-22: qty 120

## 2015-03-22 MED ORDER — PREDNISONE 5 MG PO TABS: 5.0000 mg | ORAL_TABLET | Freq: Every day | ORAL | Status: DC

## 2015-03-22 NOTE — Progress Notes (Signed)
Occupational Therapy Treatment Patient Details Name: [name] MRN: [id number] DOB: 1935-08-21 Today's Date: 03/22/2015    History of present illness Patient is a 80 year old male with extensive past medical history including type 2 diabetes, Parkinson's disease, hypertension, bipolar, CHF, empyema with thoracotomy. He is brought by EMS for evaluation of a fall. Patient is a resident of [facility]. He is apparently suffered multiple falls this week. He was seen here yesterday for similar injury. Head CT at that time was negative and he was returned home. He returns after yet another fall which may be his fourth and possibly fifth fall in the past week.   OT comments  Pt is progressing with OT.  He was able to perform simple grooming EOB with min A to maintain his balance.  He ambulated ~50' in the room with min A before fatiguing.   Follow Up Recommendations       Equipment Recommendations       Recommendations for Other Services      Precautions / Restrictions Precautions Precautions: Fall Precaution Comments: reports 5 falls recently       Mobility Bed Mobility Overal bed mobility: Needs Assistance Bed Mobility: Supine to Sit;Sit to Supine     Supine to sit: Mod assist;HOB elevated Sit to supine: Min assist   General bed mobility comments: Min A to move LEs off bed and mod A to lift trunk from bed.  Step by step cues for sequencing .  Pt loses balance to the Rt   Transfers                      Balance Overall balance assessment: Needs assistance   Sitting balance-Leahy Scale: Poor Sitting balance - Comments: Pt initially requiring min A to maintain sitting balance.  Progressed to min guard assist  Postural control: Right lateral lean Standing balance support: Bilateral upper extremity supported Standing balance-Leahy Scale: Poor Standing balance comment: requires min A                   ADL Overall ADL's : Needs  assistance/impaired     Grooming: Oral care;Sitting;Minimal assistance Grooming Details (indicate cue type and reason): Pt periodically loses balance - requires min A                  Toilet Transfer: Minimal assistance;Ambulation;Comfort height toilet;Grab bars;RW [employer] technical [employer] Details (indicate cue type and reason): min A for balance  Toileting- Clothing Manipulation and Hygiene: Moderate assistance;Sit to/from stand       Functional mobility during ADLs: Minimal assistance;Rolling walker General ADL Comments: Pt requires encouragement to participate       Vision                     Perception     Praxis      Cognition   Behavior During Therapy: Anxious;WFL for tasks assessed/performed (easily frusrated) Overall Cognitive Status: No family/caregiver present to determine baseline cognitive functioning     Current Attention Level: Sustained Memory: Decreased short-term memory  Following Commands: Follows one step commands consistently Safety/Judgement: Decreased awareness of safety   Problem Solving: Slow processing;Decreased initiation;Difficulty sequencing;Requires verbal cues;Requires tactile cues General Comments: Pt asks same questions repetivitely.  He requires step by step instructions for problem solving     Extremity/Trunk Assessment               Exercises     Shoulder Instructions  General Comments      Pertinent Vitals/ Pain       Pain Assessment: No/denies pain  Home Living                                          Prior Functioning/Environment              Frequency       Progress Toward Goals  OT Goals(current goals can now be found in the care plan section)        Plan      Co-evaluation                 End of Session     Activity Tolerance     Patient Left     Nurse Communication          Time: 2574-9355 OT Time Calculation (min): 24 min  Charges:    Lucille Passy M 03/22/2015, 12:15 PM

## 2015-03-22 NOTE — Progress Notes (Signed)
Report called to RN at [facility] and all questions answered.  Ordered to call me back if questions arise. Graceann Congress

## 2015-03-22 NOTE — Discharge Summary (Signed)
Physician Discharge Summary  [name] VOJ:[id number] DOB: [birth year] DOA: 03/16/2015  PCP: No primary care provider on file.  Admit date: 03/16/2015 Discharge date: 03/22/2015  Time spent: 20 minutes  Recommendations for Outpatient Follow-up:  1. Follow up with Neurology as scheduled 2. Follow up with PCP in 1-2 weeks 3. Would repeat renal panel in 1-2 weeks  Discharge Diagnoses:  Principal Problem:   Fever Active Problems:   Parkinson disease   Diabetes mellitus, type 2   Bipolar disorder   Neuromuscular disorder   Chronic diastolic congestive heart failure   Memory difficulty   Fall   Sacral pressure ulcer   Discharge Condition: Improved  Diet recommendation: Diabetic  Filed Weights   03/20/15 0524 03/21/15 0510 03/22/15 0458  Weight: 70.903 kg (156 lb 5 oz) 70.876 kg (156 lb 4.1 oz) 69.6 kg (153 lb 7 oz)    History of present illness:  Please see admit h and p from 3/29 for details. Briefly, pt presented with acute delirium, found to be septic with flu. The patient was admitted for further work up.  Hospital Course:  Multiple falls in the setting of Parkinsons Disease. -Could be a natural progression of his Parkinson's Dz vs weakness from acute flu.Pt to follow up with Dr. Jannifer Franklin ASAP after D/C -PT/OT consulted. Recs for SNF noted  Fever with H1N1 Influenza. -Pt is found to be flu positive, H1N1 -Completed course of tamiflu  Pulmonary vascular congestion -Noted on Xray.  -Patient's last echo in 2015 showed grade 1 diastolic dysfunction.  Non Productive Cough with mild wheezing with hypoxia -Will continue mucinex, schedule nebs. -wheezing improved. Wean steroids as tolerated -Pt now on min O2 support  Sacral decubitus - multiple skin abrasions. -WOC requested. Recommendations appreciated.  Dementia with acute delirium - Suspect acute toxic-metabolic encephalopathy related to flu - Much improved and oriented.  DVT Prophylaxis: SCDs while  inpatient  Pseudomonas UTI: - On cefepime initially - Changed out foley catheter on 4/1 - Pt to complete course of cipro on discharge  Hyperkalemia - borderline elevated K - Stopped lisinopril - Would follow electrolytes as outpatient  Consultations:  none  Discharge Exam: Filed Vitals:   03/22/15 0210 03/22/15 0458 03/22/15 0923 03/22/15 1102  BP:  127/61  151/66  Pulse:  51    Temp:  97.7 F (36.5 C)    TempSrc:  Oral    Resp:  18    Height:      Weight:  69.6 kg (153 lb 7 oz)    SpO2: 95% 96% 92%     General: awake, in nad Cardiovascular: regular, s1, s2 Respiratory: normal resp effort, no wheezing  Discharge Instructions     Medication List    STOP taking these medications        lisinopril 10 MG tablet  Commonly known as:  PRINIVIL,ZESTRIL      TAKE these medications        acetaminophen 325 MG tablet  Commonly known as:  TYLENOL  Take 650 mg by mouth every 6 (six) hours as needed for mild pain.     albuterol (2.5 MG/3ML) 0.083% nebulizer solution  Commonly known as:  PROVENTIL  Take 3 mLs (2.5 mg total) by nebulization every 2 (two) hours as needed for wheezing or shortness of breath.     amLODipine 5 MG tablet  Commonly known as:  NORVASC  Take 1 tablet (5 mg total) by mouth daily.     aspirin 81 MG chewable tablet  Chew  81 mg by mouth daily.     bisacodyl 10 MG suppository  Commonly known as:  DULCOLAX  Place 10 mg rectally daily as needed for moderate constipation.     carbidopa-levodopa 25-250 MG per tablet  Commonly known as:  SINEMET IR  Take 1 tablet by mouth 3 (three) times daily.     ciprofloxacin 500 MG tablet  Commonly known as:  CIPRO  Take 1 tablet (500 mg total) by mouth 2 (two) times daily.     clonazePAM 0.5 MG tablet  Commonly known as:  KLONOPIN  Take 1/2 tab at noon and 1 tab at bed time     dimethicone-zinc oxide cream  Apply topically 2 (two) times daily as needed for dry skin.     eucerin cream  Apply 1  application topically 2 (two) times daily.     entacapone 200 MG tablet  Commonly known as:  COMTAN  Take 200 mg by mouth 3 (three) times daily.     fluticasone 50 MCG/ACT nasal spray  Commonly known as:  FLONASE  Place 2 sprays into both nostrils daily.     GLUCERNA Liqd  Take 237 mLs by mouth daily.     guaiFENesin 600 MG 12 hr tablet  Commonly known as:  MUCINEX  Take 1 tablet (600 mg total) by mouth 2 (two) times daily.     hydrALAZINE 25 MG tablet  Commonly known as:  APRESOLINE  Take 25 mg by mouth 3 (three) times daily.     hydrocortisone 2.5 % cream  Apply 1 application topically as needed (for rash).     ipratropium-albuterol 0.5-2.5 (3) MG/3ML Soln  Commonly known as:  DUONEB  Take 3 mLs by nebulization 3 (three) times daily.     lamoTRIgine 150 MG tablet  Commonly known as:  LAMICTAL  Take 1 tablet (150 mg total) by mouth 2 (two) times daily.     loratadine 10 MG tablet  Commonly known as:  CLARITIN  Take 10 mg by mouth daily as needed for allergies.     mineral oil-hydrophilic petrolatum ointment  Apply 1 application topically 2 (two) times daily as needed for dry skin. For dry skin on heels     multivitamin with minerals Tabs tablet  Take 1 tablet by mouth daily.     nitrofurantoin (macrocrystal-monohydrate) 100 MG capsule  Commonly known as:  MACROBID  Take 1 capsule (100 mg total) by mouth 2 (two) times daily.     pantoprazole 40 MG tablet  Commonly known as:  PROTONIX  Take 1 tablet (40 mg total) by mouth daily at 12 noon.     polyethylene glycol packet  Commonly known as:  MIRALAX / GLYCOLAX  Take 17 g by mouth daily.     predniSONE 5 MG tablet  Commonly known as:  DELTASONE  Take 1 tablet (5 mg total) by mouth daily with breakfast.     QUEtiapine 50 MG tablet  Commonly known as:  SEROQUEL  Take 1 tablet (50 mg total) by mouth at bedtime.     RED YEAST RICE PO  Take 2 tablets by mouth at bedtime.     sertraline 100 MG tablet  Commonly  known as:  ZOLOFT  Take 2 tablets (200 mg total) by mouth daily.     traZODone 100 MG tablet  Commonly known as:  DESYREL  Take 1 tablet (100 mg total) by mouth at bedtime.     Valerian Root 530 MG Caps  Take 1  capsule by mouth daily.       Allergies  Allergen Reactions  . Cholestatin Other (See Comments)    Per MAR   Follow-up Information    Follow up with Lenor Coffin, MD On 03/29/2015.   Specialty:  Neurology   Why:  3:00PM   PLEASE KEEP THIS APPOINTMENT!   Contact information:   [address] [place] [address] [phone]       Follow up with follow up with PCP in 1-2 weeks.       The results of significant diagnostics from this hospitalization (including imaging, microbiology, ancillary and laboratory) are listed below for reference.    Significant Diagnostic Studies: Dg Chest 1 View  03/16/2015   CLINICAL DATA:  Status post fall, with altered mental status. Initial encounter.  EXAM: CHEST  1 VIEW  COMPARISON:  Chest radiograph performed 03/12/2015  FINDINGS: The lungs are well-aerated. Vascular congestion is noted. No pleural effusion or pneumothorax is seen.  The cardiomediastinal silhouette is borderline normal in size. No acute osseous abnormalities are seen. There is chronic deformity involving left-sided ribs. Chronic degenerative change is noted at the glenohumeral joints bilaterally.  IMPRESSION: Vascular congestion noted. Lungs remain grossly clear. No acute displaced rib fracture seen.   Electronically Signed   By: Garald Balding M.D.   On: 03/16/2015 05:05   Dg Chest 2 View  03/12/2015   CLINICAL DATA:  Fever, congestion, hypertension, type to diabetes, Parkinson's  EXAM: CHEST  2 VIEW  COMPARISON:  09/21/2014 ; correlation CT chest 04/18/2014  FINDINGS: Enlargement of cardiac silhouette.  Calcification and elongation of thoracic aorta.  Mediastinal contours and pulmonary vascularity normal.  Minimal elevation RIGHT diaphragm.  Persistent  nodular density at minor fissure 20 x 12 mm corresponding to chronic atelectasis, loculated pleural fluid or pleural thickening by prior CT.  No acute infiltrate, pleural effusion or pneumothorax.  Multiple old LEFT rib fractures.  Advanced BILATERAL glenohumeral degenerative changes.  IMPRESSION: Enlargement of cardiac silhouette.  Chronic nodular opacity at the minor fissure unchanged since May 2015.  No acute abnormalities.   Electronically Signed   By: Lavonia Dana M.D.   On: 03/12/2015 08:40   Dg Pelvis 1-2 Views  03/16/2015   CLINICAL DATA:  Status post fall, with altered mental status. Initial encounter.  EXAM: PELVIS - 1-2 VIEW  COMPARISON:  Abdominal radiograph performed 10/01/2009  FINDINGS: There is no evidence of fracture or dislocation. Bilateral hip hemiarthroplasties are grossly unremarkable, though incompletely imaged. Mild degenerative change is noted at the lower lumbar spine. The sacroiliac joints are unremarkable in appearance.  The visualized bowel gas pattern is grossly unremarkable in appearance.  IMPRESSION: No evidence of fracture or dislocation. Visualized hardware appears grossly intact.   Electronically Signed   By: Garald Balding M.D.   On: 03/16/2015 05:07   Ct Head Wo Contrast  03/16/2015   CLINICAL DATA:  Found on floor; unwitnessed fall. Concern for head or cervical spine injury. Initial encounter.  EXAM: CT HEAD WITHOUT CONTRAST  CT CERVICAL SPINE WITHOUT CONTRAST  TECHNIQUE: Multidetector CT imaging of the head and cervical spine was performed following the standard protocol without intravenous contrast. Multiplanar CT image reconstructions of the cervical spine were also generated.  COMPARISON:  CT of the cervical spine performed 01/30/2013, MRI of the brain performed 01/31/2013, and CT of the head performed 03/15/2015  FINDINGS: CT HEAD FINDINGS  There is no evidence of acute infarction, mass lesion, or intra- or extra-axial hemorrhage on CT.  Prominence of the ventricles  and sulci reflects moderate cortical volume loss. Cerebellar atrophy is noted. 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 Scattered periventricular and subcortical white matter change likely reflects small vessel ischemic microangiopathy.  The brainstem and fourth ventricle are within normal limits. The basal ganglia are unremarkable in appearance. The cerebral hemispheres demonstrate grossly normal gray-white differentiation. No mass effect or midline shift is seen.  There is no evidence of fracture; visualized osseous structures are unremarkable in appearance. The orbits are within normal limits. Mild mucosal thickening is noted at the sphenoid sinus. The remaining paranasal sinuses and mastoid air cells are well-aerated. No significant soft tissue abnormalities are seen.  CT CERVICAL SPINE FINDINGS  There is no evidence of fracture or subluxation. Vertebral bodies demonstrate normal height and alignment. Minimal disc space narrowing is noted along the lower cervical spine, with scattered anterior and posterior disc osteophyte complexes. Prevertebral soft tissues are within normal limits.  The visualized portions of the thyroid gland are unremarkable in appearance. The visualized lung apices are clear. Scattered calcification is noted at the carotid bifurcations bilaterally.  IMPRESSION: 1. No evidence of traumatic intracranial injury or fracture. 2. No evidence of fracture or subluxation along the cervical spine. 3. Moderate cortical volume loss and scattered small vessel ischemic microangiopathy. 4. 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 5. Mild mucosal thickening at the sphenoid sinus. 6. Minimal degenerative change along the lower cervical spine. 7. Scattered calcification at the carotid bifurcations bilaterally. Carotid ultrasound would be helpful for further evaluation, when and as deemed clinically appropriate.   Electronically Signed    By: Garald Balding M.D.   On: 03/16/2015 05:16   Ct Head Wo Contrast  03/15/2015   CLINICAL DATA:  Unwitnessed fall, found on floor.  EXAM: CT HEAD WITHOUT CONTRAST  TECHNIQUE: Contiguous axial images were obtained from the base of the skull through the vertex without intravenous contrast.  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 Mild chronic small vessel ischemia. No intracranial hemorrhage, mass effect, or midline shift. No hydrocephalus. The basilar cisterns are patent. No evidence of territorial infarct. No intracranial fluid collection. Calvarium is intact. Included paranasal sinuses and mastoid air cells are well aerated.  IMPRESSION: 1.  No acute intracranial abnormality. 2. 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   Electronically Signed   By: Jeb Levering M.D.   On: 03/15/2015 00:46   Ct Cervical Spine Wo Contrast  03/16/2015   CLINICAL DATA:  Found on floor; unwitnessed fall. Concern for head or cervical spine injury. Initial encounter.  EXAM: CT HEAD WITHOUT CONTRAST  CT CERVICAL SPINE WITHOUT CONTRAST  TECHNIQUE: Multidetector CT imaging of the head and cervical spine was performed following the standard protocol without intravenous contrast. Multiplanar CT image reconstructions of the cervical spine were also generated.  COMPARISON:  CT of the cervical spine performed 01/30/2013, MRI of the brain performed 01/31/2013, and CT of the head performed 03/15/2015  FINDINGS: CT HEAD FINDINGS  There is no evidence of acute infarction, mass lesion, or intra- or extra-axial hemorrhage on CT.  Prominence of the ventricles and sulci reflects moderate cortical volume loss. Cerebellar atrophy is noted. 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 Scattered periventricular  and subcortical white matter change likely reflects small vessel ischemic microangiopathy.  The brainstem and fourth ventricle are within normal limits. The basal ganglia are unremarkable in appearance. The cerebral hemispheres demonstrate grossly normal gray-white differentiation. No mass effect or midline shift is seen.  There is no evidence of fracture; visualized osseous structures are unremarkable in appearance. The orbits are within normal limits. Mild mucosal thickening is noted at the sphenoid sinus. The remaining paranasal sinuses and mastoid air cells are well-aerated. No significant soft tissue abnormalities are seen.  CT CERVICAL SPINE FINDINGS  There is no evidence of fracture or subluxation. Vertebral bodies demonstrate normal height and alignment. Minimal disc space narrowing is noted along the lower cervical spine, with scattered anterior and posterior disc osteophyte complexes. Prevertebral soft tissues are within normal limits.  The visualized portions of the thyroid gland are unremarkable in appearance. The visualized lung apices are clear. Scattered calcification is noted at the carotid bifurcations bilaterally.  IMPRESSION: 1. No evidence of traumatic intracranial injury or fracture. 2. No evidence of fracture or subluxation along the cervical spine. 3. Moderate cortical volume loss and scattered small vessel ischemic microangiopathy. 4. 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 5. Mild mucosal thickening at the sphenoid sinus. 6. Minimal degenerative change along the lower cervical spine. 7. Scattered calcification at the carotid bifurcations bilaterally. Carotid ultrasound would be helpful for further evaluation, when and as deemed clinically appropriate.   Electronically Signed   By: Garald Balding M.D.   On: 03/16/2015 05:16    Microbiology: Recent Results (from the past 240 hour(s))  Urine culture     Status: None   Collection Time: 03/16/15  4:03  AM  Result Value Ref Range Status   Specimen Description URINE, RANDOM  Final   Special Requests ADD [id number]  Final   Colony Count   Final    >=100,000 COLONIES/ML Performed at [facility]    Culture   Final    PSEUDOMONAS AERUGINOSA Performed at [facility]    Report Status 03/18/2015 FINAL  Final   Organism ID, Bacteria PSEUDOMONAS AERUGINOSA  Final      Susceptibility   Pseudomonas aeruginosa - MIC*    CEFEPIME 2 SENSITIVE Sensitive     CEFTAZIDIME 4 SENSITIVE Sensitive     CIPROFLOXACIN <=0.25 SENSITIVE Sensitive     GENTAMICIN <=1 SENSITIVE Sensitive     IMIPENEM 2 SENSITIVE Sensitive     PIP/TAZO 8 SENSITIVE Sensitive     TOBRAMYCIN <=1 SENSITIVE Sensitive     * PSEUDOMONAS AERUGINOSA  Blood culture (routine x 2)     Status: None   Collection Time: 03/16/15  4:20 AM  Result Value Ref Range Status   Specimen Description BLOOD RIGHT ARM  Final   Special Requests BOTTLES DRAWN AEROBIC AND ANAEROBIC 10CC EACH  Final   Culture   Final    NO GROWTH 5 DAYS Note: Culture results may be compromised due to an excessive volume of blood received in culture bottles. Performed at [facility]    Report Status 03/22/2015 FINAL  Final  Blood culture (routine x 2)     Status: None   Collection Time: 03/16/15  4:28 AM  Result Value Ref Range Status   Specimen Description BLOOD RIGHT HAND  Final   Special Requests BOTTLES DRAWN AEROBIC ONLY 5CC  Final   Culture   Final    NO GROWTH 5 DAYS Performed at [facility]    Report Status 03/22/2015 FINAL  Final  MRSA PCR Screening  Status: None   Collection Time: 03/16/15  8:15 AM  Result Value Ref Range Status   MRSA by PCR NEGATIVE NEGATIVE Final    Comment:        The GeneXpert MRSA Assay (FDA approved for NASAL specimens only), is one component of a comprehensive MRSA colonization surveillance program. It is not intended to diagnose MRSA infection nor to guide or monitor treatment  for MRSA infections.      Labs: Basic Metabolic Panel:  Recent Labs Lab 03/16/15 0416  03/18/15 0601 03/19/15 0355 03/20/15 0315 03/21/15 0440 03/22/15 0330  NA  --   < > 138 141 140 137 139  K  --   < > 4.0 5.2* 5.2* 5.3* 5.4*  CL  --   < > 105 104 105 101 105  CO2  --   < > 25 33* 30 27 28   GLUCOSE  --   < > 100* 151* 150* 147* 130*  BUN  --   < > 25* 34* 33* 37* 39*  CREATININE  --   < > 1.25 1.27 1.07 1.20 1.24  CALCIUM  --   < > 8.3* 9.0 8.8 8.8 8.8  MG 2.0  --   --   --   --   --   --   < > = values in this interval not displayed. Liver Function Tests:  Recent Labs Lab 03/16/15 0403  AST 54*  ALT 22  ALKPHOS 64  BILITOT 0.7  PROT 4.9*  ALBUMIN 3.4*   No results for input(s): LIPASE, AMYLASE in the last 168 hours. No results for input(s): AMMONIA in the last 168 hours. CBC:  Recent Labs Lab 03/17/15 0335 03/18/15 0601 03/19/15 0355 03/20/15 0315 03/22/15 0330  WBC 4.3 3.8* 4.1 6.1 10.1  HGB 12.0* 11.4* 11.7* 11.7* 11.6*  HCT 36.3* 34.6* 36.2* 35.6* 36.0*  MCV 90.5 90.1 91.4 90.6 90.2  PLT 117* 116* 154 194 279   Cardiac Enzymes: No results for input(s): CKTOTAL, CKMB, CKMBINDEX, TROPONINI in the last 168 hours. BNP: BNP (last 3 results) No results for input(s): BNP in the last 8760 hours.  ProBNP (last 3 results)  Recent Labs  04/17/14 2316  PROBNP 1227.0*    CBG:  Recent Labs Lab 03/21/15 1142 03/21/15 1708 03/21/15 2127 03/22/15 0556 03/22/15 1130  GLUCAP 132* 161* 150* 94 119*    Signed:  CHIU, STEPHEN K  [facility] 03/22/2015, 1:39 PM

## 2015-03-22 NOTE — Clinical Social Work Placement (Signed)
     Clinical Social Work Department CLINICAL SOCIAL WORK PLACEMENT NOTE 03/22/2015  Patient:  [name], [name]  Account Number:  [id number] Admit date:  03/16/2015  Clinical Social Worker:  Aldona Bar DOLLARHITE, CLINICAL SOCIAL WORKER  Date/time:  03/18/2015 12:21 PM  Clinical Social Work is seeking post-discharge placement for this patient at the following level of care:   SKILLED NURSING   (*CSW will update this form in Epic as items are completed)   03/18/2015  Patient/family provided with [place] Department of Clinical Social Works list of facilities offering this level of care within the geographic area requested by the patient (or if unable, by the patients family).  03/18/2015  Patient/family informed of their freedom to choose among providers that offer the needed level of care, that participate in Medicare, Medicaid or managed care program needed by the patient, have an available bed and are willing to accept the patient.  03/18/2015  Patient/family informed of [facility] ownership interest in [facility], as well as of the fact that they are under no obligation to receive care at this facility.  PASARR submitted to EDS on 03/18/2015 PASARR number received on 03/18/2015  FL2 transmitted to all facilities in geographic area requested by pt/family on  03/18/2015 FL2 transmitted to all facilities within larger geographic area on 03/18/2015  Patient informed that his/her managed care company has contracts with or will negotiate with  certain facilities, including the following:   NA     Patient/family informed of bed offers received:  03/22/2015 Patient chooses bed at [place] Physician recommends and patient chooses bed at    Patient to be transferred to [place] on  03/22/2015 Patient to be transferred to facility by [facility]ulance [facility]) Patient and family notified of transfer on 03/22/2015 Name of family  member notified:  [name]- Daughter  The following physician request were entered in Epic: Physician Request  Please sign FL2.    Additional Comments: 03/22/15 OK per MD for d/c today to SNF for rehab stay. Patient and daughter are very pleased with d/c plan and had requested [facility] and facility of choice.  If patient improves, he will hopefully be able to return to [facility].  Nursing notified to call report.  CSW signing off. Lorie Phenix. [place], [place]

## 2015-03-22 NOTE — Progress Notes (Signed)
I cosign all medication administration and documentation by Tina Griffiths student RN for this shift.

## 2015-03-22 NOTE — Consult Note (Signed)
ANTIBIOTIC CONSULT NOTE   Pharmacy Consult for Cefepime Indication: UTI  Allergies  Allergen Reactions  . Cholestatin Other (See Comments)    Per MAR    Patient Measurements: Height: 5\' 11"  (180.3 cm) Weight: 153 lb 7 oz (69.6 kg) IBW/kg (Calculated) : 75.3  Vital Signs: Temp: 97.7 F (36.5 C) (04/04 0458) Temp Source: Oral (04/04 0458) BP: 127/61 mmHg (04/04 0458) Pulse Rate: 51 (04/04 0458) Intake/Output from previous day: 04/03 0701 - 04/04 0700 In: 630 [P.O.:580; IV Piggyback:50] Out: 2550 [Urine:2550] Intake/Output from this shift:    Labs:  Recent Labs  03/20/15 0315 03/21/15 0440 03/22/15 0330  WBC 6.1  --  10.1  HGB 11.7*  --  11.6*  PLT 194  --  279  CREATININE 1.07 1.20 1.24   Estimated Creatinine Clearance: 48.3 mL/min (by C-G formula based on Cr of 1.24).   Microbiology: Recent Results (from the past 720 hour(s))  Urine culture     Status: None   Collection Time: 03/12/15  8:26 AM  Result Value Ref Range Status   Specimen Description URINE, RANDOM  Final   Special Requests NONE  Final   Colony Count   Final    >=100,000 COLONIES/ML Performed at [facility]    Culture   Final    KLEBSIELLA PNEUMONIAE Note: Two isolates with different morphologies were identified as the same organism.The most resistant organism was reported. Performed at [facility]    Report Status 03/15/2015 FINAL  Final   Organism ID, Bacteria KLEBSIELLA PNEUMONIAE  Final      Susceptibility   Klebsiella pneumoniae - MIC*    AMPICILLIN RESISTANT      CEFAZOLIN <=4 SENSITIVE Sensitive     CEFTRIAXONE <=1 SENSITIVE Sensitive     CIPROFLOXACIN <=0.25 SENSITIVE Sensitive     GENTAMICIN <=1 SENSITIVE Sensitive     LEVOFLOXACIN <=0.12 SENSITIVE Sensitive     NITROFURANTOIN 32 SENSITIVE Sensitive     TOBRAMYCIN <=1 SENSITIVE Sensitive     TRIMETH/SULFA <=20 SENSITIVE Sensitive     PIP/TAZO <=4 SENSITIVE Sensitive     * KLEBSIELLA PNEUMONIAE  Urine  culture     Status: None   Collection Time: 03/16/15  4:03 AM  Result Value Ref Range Status   Specimen Description URINE, RANDOM  Final   Special Requests ADD [id number]  Final   Colony Count   Final    >=100,000 COLONIES/ML Performed at [facility]    Culture   Final    PSEUDOMONAS AERUGINOSA Performed at [facility]    Report Status 03/18/2015 FINAL  Final   Organism ID, Bacteria PSEUDOMONAS AERUGINOSA  Final      Susceptibility   Pseudomonas aeruginosa - MIC*    CEFEPIME 2 SENSITIVE Sensitive     CEFTAZIDIME 4 SENSITIVE Sensitive     CIPROFLOXACIN <=0.25 SENSITIVE Sensitive     GENTAMICIN <=1 SENSITIVE Sensitive     IMIPENEM 2 SENSITIVE Sensitive     PIP/TAZO 8 SENSITIVE Sensitive     TOBRAMYCIN <=1 SENSITIVE Sensitive     * PSEUDOMONAS AERUGINOSA  Blood culture (routine x 2)     Status: None (Preliminary result)   Collection Time: 03/16/15  4:20 AM  Result Value Ref Range Status   Specimen Description BLOOD RIGHT ARM  Final   Special Requests BOTTLES DRAWN AEROBIC AND ANAEROBIC 10CC EACH  Final   Culture   Final           BLOOD CULTURE RECEIVED NO  GROWTH TO DATE CULTURE WILL BE HELD FOR 5 DAYS BEFORE ISSUING A FINAL NEGATIVE REPORT Note: Culture results may be compromised due to an excessive volume of blood received in culture bottles. Performed at [facility]    Report Status PENDING  Incomplete  Blood culture (routine x 2)     Status: None (Preliminary result)   Collection Time: 03/16/15  4:28 AM  Result Value Ref Range Status   Specimen Description BLOOD RIGHT HAND  Final   Special Requests BOTTLES DRAWN AEROBIC ONLY 5CC  Final   Culture   Final           BLOOD CULTURE RECEIVED NO GROWTH TO DATE CULTURE WILL BE HELD FOR 5 DAYS BEFORE ISSUING A FINAL NEGATIVE REPORT Performed at [facility]    Report Status PENDING  Incomplete  MRSA PCR Screening     Status: None   Collection Time: 03/16/15  8:15 AM  Result Value Ref Range  Status   MRSA by PCR NEGATIVE NEGATIVE Final    Comment:        The GeneXpert MRSA Assay (FDA approved for NASAL specimens only), is one component of a comprehensive MRSA colonization surveillance program. It is not intended to diagnose MRSA infection nor to guide or monitor treatment for MRSA infections.     Medical History: Past Medical History  Diagnosis Date  . Hearing aid worn   . Hypertension   . Parkinson disease   . Pneumonia   . Diabetes mellitus type II   . Arthritis   . Bipolar disorder   . Anxiety   . Depression   . MRSA infection (methicillin-resistant Staphylococcus aureus)   . Chronic indwelling Foley catheter   . C2 cervical fracture     due to pt fall  . SIRS (systemic inflammatory response syndrome)   . Memory loss   . Benign prostate hyperplasia   . Memory difficulty 08/21/2014  . Neuromuscular disorder     parkinsons   Assessment: 80yom on Day # 4 cefepime for pseudomonas in UC.  Afeb, WBC 10.1 Goal of Therapy:  Appropriate dosing  Plan:  1) Cont Cefepime 1g IV q24 2) Follow renal function and adjust as necessary  Thetis Schwimmer Poteet 03/22/2015,8:14 AM

## 2015-03-25 ENCOUNTER — Non-Acute Institutional Stay (SKILLED_NURSING_FACILITY): Payer: Medicare Other | Admitting: Internal Medicine

## 2015-03-25 DIAGNOSIS — W19XXXD Unspecified fall, subsequent encounter: Secondary | ICD-10-CM

## 2015-03-25 DIAGNOSIS — J101 Influenza due to other identified influenza virus with other respiratory manifestations: Secondary | ICD-10-CM | POA: Diagnosis not present

## 2015-03-25 DIAGNOSIS — N390 Urinary tract infection, site not specified: Secondary | ICD-10-CM

## 2015-03-25 DIAGNOSIS — I5032 Chronic diastolic (congestive) heart failure: Secondary | ICD-10-CM | POA: Diagnosis not present

## 2015-03-25 DIAGNOSIS — F0390 Unspecified dementia without behavioral disturbance: Secondary | ICD-10-CM | POA: Diagnosis not present

## 2015-03-25 DIAGNOSIS — E875 Hyperkalemia: Secondary | ICD-10-CM | POA: Diagnosis not present

## 2015-03-25 DIAGNOSIS — R509 Fever, unspecified: Secondary | ICD-10-CM

## 2015-03-25 DIAGNOSIS — R0902 Hypoxemia: Secondary | ICD-10-CM | POA: Diagnosis not present

## 2015-03-25 DIAGNOSIS — L89152 Pressure ulcer of sacral region, stage 2: Secondary | ICD-10-CM

## 2015-03-25 NOTE — Assessment & Plan Note (Signed)
Suspect acute toxic-metabolic encephalopathy related to flu - Much improved and oriented.

## 2015-03-25 NOTE — Assessment & Plan Note (Signed)
Noted on Xray.  -Patient's last echo in 2015 showed grade 1 diastolic dysfunction.

## 2015-03-25 NOTE — Assessment & Plan Note (Signed)
Pt is found to be flu positive, H1N1 -Completed course of tamiflu

## 2015-03-25 NOTE — Progress Notes (Signed)
MRN: [id number] Name: [name]  Sex: male Age: 80 y.o. DOB: [birth year]  [place]ains #: Helene Kelp Facility/Room:210 Level Of Care: SNF Provider: Inocencio Homes D Emergency Contacts: Extended Emergency Contact Information Primary Emergency Contact: Liptak,Carolyn Address: [address]          [place], [place] [address] [place] Phone: [phone] Relation: Other Secondary Emergency Contact: [place] Work Phone: [phone] Mobile Phone: [phone] Relation: Friend  Code Status:DNR   Allergies: Cholestatin  Chief Complaint  Patient presents with  . New Admit To SNF    HPI: Patient is 80 y.o. male who was hospitalized for acute influenza, now admitted to SNF for OT/PT.  Past Medical History  Diagnosis Date  . Hearing aid worn   . Hypertension   . Parkinson disease   . Pneumonia   . Diabetes mellitus type II   . Arthritis   . Bipolar disorder   . Anxiety   . Depression   . MRSA infection (methicillin-resistant Staphylococcus aureus)   . Chronic indwelling Foley catheter   . C2 cervical fracture     due to pt fall  . SIRS (systemic inflammatory response syndrome)   . Memory loss   . Benign prostate hyperplasia   . Memory difficulty 08/21/2014  . Neuromuscular disorder     parkinsons    Past Surgical History  Procedure Laterality Date  . Total hip arthroplasty    . Video bronchoscopy  12/16/2012    Procedure: VIDEO BRONCHOSCOPY;  Surgeon: Melrose Nakayama, MD;  Location: [facility];  Service: Thoracic;  Laterality: Right;  . Video assisted thoracoscopy (vats)/empyema  12/16/2012    Procedure: VIDEO ASSISTED THORACOSCOPY (VATS)/EMPYEMA;  Surgeon: Melrose Nakayama, MD;  Location: [facility];  Service: Thoracic;  Laterality: Right;      Medication List       This list is accurate as of: 03/25/15  5:09 PM.  Always use your most recent med list.               acetaminophen 325 MG tablet  Commonly known  as:  TYLENOL  Take 650 mg by mouth every 6 (six) hours as needed for mild pain.     albuterol (2.5 MG/3ML) 0.083% nebulizer solution  Commonly known as:  PROVENTIL  Take 3 mLs (2.5 mg total) by nebulization every 2 (two) hours as needed for wheezing or shortness of breath.     amLODipine 5 MG tablet  Commonly known as:  NORVASC  Take 1 tablet (5 mg total) by mouth daily.     aspirin 81 MG chewable tablet  Chew 81 mg by mouth daily.     bisacodyl 10 MG suppository  Commonly known as:  DULCOLAX  Place 10 mg rectally daily as needed for moderate constipation.     carbidopa-levodopa 25-250 MG per tablet  Commonly known as:  SINEMET IR  Take 1 tablet by mouth 3 (three) times daily.     ciprofloxacin 500 MG tablet  Commonly known as:  CIPRO  Take 1 tablet (500 mg total) by mouth 2 (two) times daily.     clonazePAM 0.5 MG tablet  Commonly known as:  KLONOPIN  Take 1/2 tab at noon and 1 tab at bed time     dimethicone-zinc oxide cream  Apply topically 2 (two) times daily as needed for dry skin.     eucerin cream  Apply 1 application topically 2 (two) times daily.     entacapone 200  MG tablet  Commonly known as:  COMTAN  Take 200 mg by mouth 3 (three) times daily.     fluticasone 50 MCG/ACT nasal spray  Commonly known as:  FLONASE  Place 2 sprays into both nostrils daily.     GLUCERNA Liqd  Take 237 mLs by mouth daily.     guaiFENesin 600 MG 12 hr tablet  Commonly known as:  MUCINEX  Take 1 tablet (600 mg total) by mouth 2 (two) times daily.     hydrALAZINE 25 MG tablet  Commonly known as:  APRESOLINE  Take 25 mg by mouth 3 (three) times daily.     hydrocortisone 2.5 % cream  Apply 1 application topically as needed (for rash).     ipratropium-albuterol 0.5-2.5 (3) MG/3ML Soln  Commonly known as:  DUONEB  Take 3 mLs by nebulization 3 (three) times daily.     lamoTRIgine 150 MG tablet  Commonly known as:  LAMICTAL  Take 1 tablet (150 mg total) by mouth 2 (two)  times daily.     loratadine 10 MG tablet  Commonly known as:  CLARITIN  Take 10 mg by mouth daily as needed for allergies.     mineral oil-hydrophilic petrolatum ointment  Apply 1 application topically 2 (two) times daily as needed for dry skin. For dry skin on heels     multivitamin with minerals Tabs tablet  Take 1 tablet by mouth daily.     nitrofurantoin (macrocrystal-monohydrate) 100 MG capsule  Commonly known as:  MACROBID  Take 1 capsule (100 mg total) by mouth 2 (two) times daily.     pantoprazole 40 MG tablet  Commonly known as:  PROTONIX  Take 1 tablet (40 mg total) by mouth daily at 12 noon.     polyethylene glycol packet  Commonly known as:  MIRALAX / GLYCOLAX  Take 17 g by mouth daily.     predniSONE 5 MG tablet  Commonly known as:  DELTASONE  Take 1 tablet (5 mg total) by mouth daily with breakfast.     QUEtiapine 50 MG tablet  Commonly known as:  SEROQUEL  Take 1 tablet (50 mg total) by mouth at bedtime.     RED YEAST RICE PO  Take 2 tablets by mouth at bedtime.     sertraline 100 MG tablet  Commonly known as:  ZOLOFT  Take 2 tablets (200 mg total) by mouth daily.     traZODone 100 MG tablet  Commonly known as:  DESYREL  Take 1 tablet (100 mg total) by mouth at bedtime.     Valerian Root 530 MG Caps  Take 1 capsule by mouth daily.        No orders of the defined types were placed in this encounter.    Immunization History  Administered Date(s) Administered  . Pneumococcal Polysaccharide-23 08/13/2012  . Tdap 06/07/2012    History  Substance Use Topics  . Smoking status: Never Smoker   . Smokeless tobacco: Never Used  . Alcohol Use: No     Comment: occasional    Family history is noncontributory    Review of Systems  DATA OBTAINED: from patient, nurse GENERAL:  no fevers, fatigue, appetite changes SKIN: No itching, rash or wounds EYES: No eye pain, redness, discharge EARS: No earache, tinnitus, change in hearing NOSE: No  congestion, drainage or bleeding  MOUTH/THROAT: No mouth or tooth pain, No sore throat RESPIRATORY: No cough, wheezing, SOB CARDIAC: No chest pain, palpitations, lower extremity edema  GI: No  abdominal pain, No N/V/D or constipation, No heartburn or reflux  GU: No dysuria, frequency or urgency, or incontinence  MUSCULOSKELETAL: No unrelieved bone/joint pain NEUROLOGIC: No headache, dizziness or focal weakness PSYCHIATRIC: No overt anxiety or sadness, No behavior issue.   Filed Vitals:   03/25/15 1336  BP: 155/85  Pulse: 70  Temp: 98.2 F (36.8 C)  Resp: 20    Physical Exam  GENERAL APPEARANCE: Alert, conversant,  No acute distress.  SKIN: No diaphoresis rash HEAD: Normocephalic, atraumatic  EYES: Conjunctiva/lids clear. Pupils round, reactive. EOMs intact.  EARS: External exam WNL, canals clear. Hearing grossly normal.  NOSE: No deformity or discharge.  MOUTH/THROAT: Lips w/o lesions  RESPIRATORY: Breathing is even, unlabored. Lung sounds are clear   CARDIOVASCULAR: Heart RRR no murmurs, rubs or gallops. No peripheral edema.   GASTROINTESTINAL: Abdomen is soft, non-tender, not distended w/ normal bowel sounds. GENITOURINARY: Bladder non tender, not distended  MUSCULOSKELETAL: No abnormal joints or musculature NEUROLOGIC:  Cranial nerves 2-12 grossly intact  PSYCHIATRIC: Mood and affect appropriate to situation, no behavioral issues  Patient Active Problem List   Diagnosis Date Noted  . Influenza A (H1N1) 03/25/2015  . Dementia without behavioral disturbance 03/25/2015  . Hyperkalemia 03/25/2015  . Fall 03/16/2015  . Sacral pressure ulcer 03/16/2015  . Fever 03/16/2015  . Memory difficulty 08/21/2014  . Malnutrition of moderate degree 04/19/2014  . UTI (lower urinary tract infection) 04/18/2014  . Hypoxia 04/18/2014  . PNA (pneumonia) 04/18/2014  . Chronic diastolic congestive heart failure 02/01/2013  . Pericardial effusion 02/01/2013  . Bradycardia 01/31/2013  .  Empyema lung 12/23/2012  . S/P thoracotomy 12/23/2012  . Parapneumonic effusion 12/10/2012  . Abnormality of gait 12/05/2012  . Hypernatremia 08/13/2012  . Hypertension   . Diabetes mellitus, type 2   . Arthritis   . Bipolar disorder   . Anxiety   . Chronic indwelling Foley catheter   . Neuromuscular disorder   . Bacteremia of undetermined etiology 06/17/2012  . Parkinson disease 04/16/2012  . Elevated troponin 04/16/2012  . Hearing aid worn   . Diabetes mellitus 07/06/2011    CBC    Component Value Date/Time   WBC 10.1 03/22/2015 0330   RBC 3.99* 03/22/2015 0330   HGB 11.6* 03/22/2015 0330   HCT 36.0* 03/22/2015 0330   PLT 279 03/22/2015 0330   MCV 90.2 03/22/2015 0330   LYMPHSABS 1.4 07/24/2014 2335   MONOABS 0.6 07/24/2014 2335   EOSABS 0.2 07/24/2014 2335   BASOSABS 0.0 07/24/2014 2335    CMP     Component Value Date/Time   NA 139 03/22/2015 0330   K 5.4* 03/22/2015 0330   CL 105 03/22/2015 0330   CO2 28 03/22/2015 0330   GLUCOSE 130* 03/22/2015 0330   BUN 39* 03/22/2015 0330   CREATININE 1.24 03/22/2015 0330   CALCIUM 8.8 03/22/2015 0330   PROT 4.9* 03/16/2015 0403   ALBUMIN 3.4* 03/16/2015 0403   AST 54* 03/16/2015 0403   ALT 22 03/16/2015 0403   ALKPHOS 64 03/16/2015 0403   BILITOT 0.7 03/16/2015 0403   GFRNONAA 54* 03/22/2015 0330   GFRAA 62* 03/22/2015 0330    Assessment and Plan  Fall Could be a natural progression of his Parkinson's Dz vs weakness from acute flu.Pt to follow up with Dr. Jannifer Franklin ASAP after D/C -PT/OT consulted. Recs for SNF noted    Fever Pt is found to be flu positive, H1N1 -Completed course of tamiflu    Influenza A (H1N1) Pt is found to  be flu positive, H1N1 -Completed course of tamiflu    Chronic diastolic congestive heart failure Noted on Xray.  -Patient's last echo in 2015 showed grade 1 diastolic dysfunction.   Hypoxia Will continue mucinex, schedule nebs. -wheezing improved. Wean steroids as  tolerated -Pt now on min O2 support   Dementia without behavioral disturbance Suspect acute toxic-metabolic encephalopathy related to flu - Much improved and oriented.   UTI (lower urinary tract infection) Pseudomonas UTI: - On cefepime initially - Changed out foley catheter on 4/1 - Pt to complete course of cipro on discharge   Sacral pressure ulcer Have wound care follow   Hyperkalemia - borderline elevated K - Stopped lisinopril - Would follow electrolytes as outpatient     Hennie Duos, MD

## 2015-03-25 NOTE — Assessment & Plan Note (Signed)
Could be a natural progression of his Parkinson's Dz vs weakness from acute flu.Pt to follow up with Dr. Jannifer Franklin ASAP after D/C -PT/OT consulted. Recs for SNF noted

## 2015-03-25 NOTE — Assessment & Plan Note (Signed)
-   borderline elevated K - Stopped lisinopril - Would follow electrolytes as outpatient

## 2015-03-25 NOTE — Assessment & Plan Note (Signed)
Have wound care follow

## 2015-03-25 NOTE — Assessment & Plan Note (Signed)
Will continue mucinex, schedule nebs. -wheezing improved. Wean steroids as tolerated -Pt now on min O2 support

## 2015-03-25 NOTE — Assessment & Plan Note (Signed)
Pseudomonas UTI: - On cefepime initially - Changed out foley catheter on 4/1 - Pt to complete course of cipro on discharge

## 2015-03-27 ENCOUNTER — Encounter: Payer: Self-pay | Admitting: Internal Medicine

## 2015-03-29 ENCOUNTER — Ambulatory Visit (INDEPENDENT_AMBULATORY_CARE_PROVIDER_SITE_OTHER): Payer: Medicare Other | Admitting: Neurology

## 2015-03-29 ENCOUNTER — Encounter: Payer: Self-pay | Admitting: Neurology

## 2015-03-29 VITALS — BP 115/80 | HR 75 | Ht 71.0 in

## 2015-03-29 DIAGNOSIS — R413 Other amnesia: Secondary | ICD-10-CM

## 2015-03-29 DIAGNOSIS — G20 Parkinson's disease: Secondary | ICD-10-CM | POA: Diagnosis not present

## 2015-03-29 DIAGNOSIS — R269 Unspecified abnormalities of gait and mobility: Secondary | ICD-10-CM | POA: Diagnosis not present

## 2015-03-29 NOTE — Patient Instructions (Signed)

## 2015-03-29 NOTE — Progress Notes (Signed)
Reason for visit: Parkinson's disease  [name] is an 80 y.o. male  History of present illness:  [name] is a 80 year old right-handed white male with a history of Parkinson's disease, mild dementia, and a gait disorder. The patient has recently been in the hospital on 03/16/2015. The patient began having issues with increasing falls 3 or 4 days prior to the admission. The patient was found to have urinary tract infection, but he also contracted H1N1 influenza. The patient became delirious in the hospital, with a significant decline in his physical abilities, including difficulty walking. The patient has had decreased appetite, some weight loss. He has been out of the hospital for about one week, and he is now at [facility] extended care facility getting rehabilitation. The patient has not yet returned to his baseline. In the hospital, he was having problems with agitation and hallucinations. This is no longer the case, but the patient is slowed cognitively from his baseline. CT scan of the brain done in the hospital was unremarkable. The patient has not been able to ambulate since being out of the hospital, he has a tendency to lean backwards. He has not had any further falls. He comes to this office for reevaluation.  Past Medical History  Diagnosis Date  . Hearing aid worn   . Hypertension   . Parkinson disease   . Pneumonia   . Diabetes mellitus type II   . Arthritis   . Bipolar disorder   . Anxiety   . Depression   . MRSA infection (methicillin-resistant Staphylococcus aureus)   . Chronic indwelling Foley catheter   . C2 cervical fracture     due to pt fall  . SIRS (systemic inflammatory response syndrome)   . Memory loss   . Benign prostate hyperplasia   . Memory difficulty 08/21/2014  . Neuromuscular disorder     parkinsons  . Flu   . Falls   . UTI (urinary tract infection)     Past Surgical History  Procedure Laterality Date  . Total hip arthroplasty    .  Video bronchoscopy  12/16/2012    Procedure: VIDEO BRONCHOSCOPY;  Surgeon: Melrose Nakayama, MD;  Location: [place];  Service: Thoracic;  Laterality: Right;  . Video assisted thoracoscopy (vats)/empyema  12/16/2012    Procedure: VIDEO ASSISTED THORACOSCOPY (VATS)/EMPYEMA;  Surgeon: Melrose Nakayama, MD;  Location: [place];  Service: Thoracic;  Laterality: Right;    Family History  Problem Relation Age of Onset  . Depression Father   . Cancer Mother   . Heart disease Mother     Social history:  reports that he has never smoked. He has never used smokeless tobacco. He reports that he does not drink alcohol or use illicit drugs.    Allergies  Allergen Reactions  . Cholestatin Other (See Comments)    Per MAR    Medications:  Prior to Admission medications   Medication Sig Start Date End Date Taking? Authorizing Provider  acetaminophen (TYLENOL) 325 MG tablet Take 650 mg by mouth every 6 (six) hours as needed for mild pain.   Yes Historical Provider, MD  albuterol (PROVENTIL) (2.5 MG/3ML) 0.083% nebulizer solution Take 3 mLs (2.5 mg total) by nebulization every 2 (two) hours as needed for wheezing or shortness of breath. 04/21/14  Yes Hosie Poisson, MD  amLODipine (NORVASC) 5 MG tablet Take 1 tablet (5 mg total) by mouth daily. 02/03/13  Yes Janece Canterbury, MD  aspirin 81 MG chewable tablet Chew  81 mg by mouth daily.   Yes Historical Provider, MD  bisacodyl (DULCOLAX) 10 MG suppository Place 10 mg rectally daily as needed for moderate constipation.   Yes Historical Provider, MD  carbidopa-levodopa (SINEMET IR) 25-250 MG per tablet Take 1 tablet by mouth 3 (three) times daily.   Yes Historical Provider, MD  ciprofloxacin (CIPRO) 500 MG tablet Take 1 tablet (500 mg total) by mouth 2 (two) times daily. 03/22/15  Yes Donne Hazel, MD  clonazePAM (KLONOPIN) 0.5 MG tablet Take 1/2 tab at noon and 1 tab at bed time Patient taking differently: Take 0.25-0.5 mg by mouth 2 (two) times daily. Take  1/2 tab at noon and 1 tab at bed time 12/02/14  Yes Kathlee Nations, MD  entacapone (COMTAN) 200 MG tablet Take 200 mg by mouth 3 (three) times daily.   Yes Historical Provider, MD  fluticasone (FLONASE) 50 MCG/ACT nasal spray Place 2 sprays into both nostrils daily.   Yes Historical Provider, MD  GLUCERNA (GLUCERNA) LIQD Take 237 mLs by mouth daily.    Yes Historical Provider, MD  guaiFENesin (MUCINEX) 600 MG 12 hr tablet Take 1 tablet (600 mg total) by mouth 2 (two) times daily. 04/21/14  Yes Hosie Poisson, MD  hydrALAZINE (APRESOLINE) 25 MG tablet Take 25 mg by mouth 3 (three) times daily.   Yes Historical Provider, MD  hydrocortisone 2.5 % cream Apply 1 application topically as needed (for rash).   Yes Historical Provider, MD  ipratropium-albuterol (DUONEB) 0.5-2.5 (3) MG/3ML SOLN Take 3 mLs by nebulization 3 (three) times daily. Patient taking differently: Take 3 mLs by nebulization as needed (for breathing).  04/21/14  Yes Hosie Poisson, MD  lamoTRIgine (LAMICTAL) 150 MG tablet Take 1 tablet (150 mg total) by mouth 2 (two) times daily. 12/02/14  Yes Kathlee Nations, MD  loratadine (CLARITIN) 10 MG tablet Take 10 mg by mouth daily as needed for allergies.   Yes Historical Provider, MD  mineral oil-hydrophilic petrolatum (AQUAPHOR) ointment Apply 1 application topically 2 (two) times daily as needed for dry skin. For dry skin on heels   Yes Historical Provider, MD  Multiple Vitamin (MULTIVITAMIN WITH MINERALS) TABS tablet Take 1 tablet by mouth daily.   Yes Historical Provider, MD  nitrofurantoin, macrocrystal-monohydrate, (MACROBID) 100 MG capsule Take 1 capsule (100 mg total) by mouth 2 (two) times daily. 03/12/15  Yes Quintella Reichert, MD  pantoprazole (PROTONIX) 40 MG tablet Take 1 tablet (40 mg total) by mouth daily at 12 noon. 04/21/14  Yes Hosie Poisson, MD  polyethylene glycol (MIRALAX / GLYCOLAX) packet Take 17 g by mouth daily.   Yes Historical Provider, MD  predniSONE (DELTASONE) 5 MG tablet Take 1  tablet (5 mg total) by mouth daily with breakfast. 03/22/15  Yes Donne Hazel, MD  QUEtiapine (SEROQUEL) 50 MG tablet Take 1 tablet (50 mg total) by mouth at bedtime. 12/02/14  Yes Kathlee Nations, MD  Red Yeast Rice Extract (RED YEAST RICE PO) Take 2 tablets by mouth at bedtime.    Yes Historical Provider, MD  sertraline (ZOLOFT) 100 MG tablet Take 2 tablets (200 mg total) by mouth daily. 10/01/14  Yes Kathlee Nations, MD  Skin Protectants, Misc. (DIMETHICONE-ZINC OXIDE) cream Apply topically 2 (two) times daily as needed for dry skin.   Yes Historical Provider, MD  Skin Protectants, Misc. (EUCERIN) cream Apply 1 application topically 2 (two) times daily.   Yes Historical Provider, MD  traZODone (DESYREL) 100 MG tablet Take 1 tablet (100  mg total) by mouth at bedtime. 12/02/14  Yes Kathlee Nations, MD  Valerian Root 530 MG CAPS Take 1 capsule by mouth daily.   Yes Historical Provider, MD    ROS:  Out of a complete 14 system review of symptoms, the patient complains only of the following symptoms, and all other reviewed systems are negative.  Decreased activity, decreased appetite Cough, wheezing Daytime sleepiness Walking difficulty Skin wounds, sacral decubitus Memory loss, weakness Confusion Depression, anxiety  Blood pressure 115/80, pulse 75, height 5\' 11"  (1.803 m), weight 0 lb (0 kg).  Physical Exam  General: The patient is alert and cooperative at the time of the examination. The patient is hard of hearing.  Skin: No significant peripheral edema is noted.   Neurologic Exam  Mental status: The Mini-Mental Status Examination done today shows a total score of 15/30.   Cranial nerves: Facial symmetry is present. Speech is normal, no aphasia or dysarthria is noted. Extraocular movements are full. Visual fields are full.  Motor: The patient has good strength in all 4 extremities.  Sensory examination: Soft touch sensation is symmetric on the face, arms, and legs.  Coordination:  The patient has good finger-nose-finger and heel-to-shin bilaterally. Some apraxia with the use of the lower extremities is seen.  Gait and station: The patient was unable to ambulate. Attempts were made to stand the patient, he had a tendency to lean backwards, he could not maintain the upright position independently.  Reflexes: Deep tendon reflexes are symmetric.   CT head/cervical spine 03/16/15:  IMPRESSION: 1. No evidence of traumatic intracranial injury or fracture. 2. No evidence of fracture or subluxation along the cervical spine. 3. Moderate cortical volume loss and scattered small vessel ischemic microangiopathy. 4. 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 5. Mild mucosal thickening at the sphenoid sinus. 6. Minimal degenerative change along the lower cervical spine. 7. Scattered calcification at the carotid bifurcations bilaterally. Carotid ultrasound would be helpful for further evaluation, when and as deemed clinically appropriate.  * CT scan images were reviewed online. I agree with the written report.    Assessment/Plan:  1. Parkinson's disease  2. Gait disorder  3. Memory disorder  4. Recent hospitalization, delirium state  The patient has been out of the hospital about 1 week. The patient had a urinary tract infection and influenza. This has resulted in a decline in cognitive and physical capabilities associated with a delirium state. The patient has not yet recovered from this episode. I do not believe any further workup is indicated at the moment, but the patient will need to have ongoing physical therapy, and be monitored to ensure that he maintains an adequate diet with food and fluids. The patient will follow-up in about 2 months. The patient has had a similar episode around 2010 with a prolonged episode of disability following a extended period of delirium.  Jill Alexanders MD 03/29/2015 7:22 PM  [facility] [address] [place], [place] [address]  Phone [phone] Fax [phone]

## 2015-04-08 ENCOUNTER — Telehealth (HOSPITAL_COMMUNITY): Payer: Self-pay

## 2015-04-20 ENCOUNTER — Non-Acute Institutional Stay (SKILLED_NURSING_FACILITY): Payer: Medicare Other | Admitting: Nurse Practitioner

## 2015-04-20 DIAGNOSIS — J101 Influenza due to other identified influenza virus with other respiratory manifestations: Secondary | ICD-10-CM | POA: Diagnosis not present

## 2015-04-20 DIAGNOSIS — G20 Parkinson's disease: Secondary | ICD-10-CM

## 2015-04-20 DIAGNOSIS — W19XXXD Unspecified fall, subsequent encounter: Secondary | ICD-10-CM

## 2015-04-20 DIAGNOSIS — R410 Disorientation, unspecified: Secondary | ICD-10-CM | POA: Diagnosis not present

## 2015-04-20 DIAGNOSIS — F3160 Bipolar disorder, current episode mixed, unspecified: Secondary | ICD-10-CM | POA: Diagnosis not present

## 2015-04-20 DIAGNOSIS — I10 Essential (primary) hypertension: Secondary | ICD-10-CM | POA: Diagnosis not present

## 2015-04-20 DIAGNOSIS — I5032 Chronic diastolic (congestive) heart failure: Secondary | ICD-10-CM | POA: Diagnosis not present

## 2015-04-20 NOTE — Progress Notes (Signed)
Patient ID: [name], male   DOB: [birth year], 80 y.o.   MRN: [id number]    Nursing Home Location:  [place] of Service: SNF (31)  PCP: No primary care provider on file.  Allergies  Allergen Reactions  . Cholestatin Other (See Comments)    Per [facility]    Chief Complaint  Patient presents with  . Discharge Note    HPI:  Patient is a 80 y.o. male seen today at [facility] for discharge back to [place]. Pt wit recent hospitalized due to influenza and was transferred to [facility] for ongoing rehab. Patient currently doing well with therapy, now stable to discharge home with home health.   Review of Systems:  Review of Systems DATA OBTAINED: from patient, nurse GENERAL: no fevers, fatigue, appetite changes SKIN: No itching, rash or wounds EYES: No eye pain, redness, discharge EARS: No earache, tinnitus, change in hearing NOSE: No congestion, drainage or bleeding  MOUTH/THROAT: No mouth or tooth pain RESPIRATORY: No cough, wheezing, SOB CARDIAC: No chest pain, palpitations, or lower extremity edema  GI: No abdominal pain, No N/V/D or constipation, No heartburn or reflux  GU: chronic foley MUSCULOSKELETAL: No unrelieved bone/joint pain NEUROLOGIC: No headache, dizziness or focal weakness PSYCHIATRIC: No overt anxiety or sadness, No behavior issue.  Past Medical History  Diagnosis Date  . Hearing aid worn   . Hypertension   . Parkinson disease   . Pneumonia   . Diabetes mellitus type II   . Arthritis   . Bipolar disorder   . Anxiety   . Depression   . MRSA infection (methicillin-resistant Staphylococcus aureus)   . Chronic indwelling Foley catheter   . C2 cervical fracture     due to pt fall  . SIRS (systemic inflammatory response syndrome)   . Memory loss   . Benign prostate hyperplasia   . Memory difficulty 08/21/2014  . Neuromuscular disorder     parkinsons  . Flu   . Falls   . UTI (urinary tract infection)    Past  Surgical History  Procedure Laterality Date  . Total hip arthroplasty    . Video bronchoscopy  12/16/2012    Procedure: VIDEO BRONCHOSCOPY;  Surgeon: Melrose Nakayama, MD;  Location: [place];  Service: Thoracic;  Laterality: Right;  . Video assisted thoracoscopy (vats)/empyema  12/16/2012    Procedure: VIDEO ASSISTED THORACOSCOPY (VATS)/EMPYEMA;  Surgeon: Melrose Nakayama, MD;  Location: [place];  Service: Thoracic;  Laterality: Right;   Social History:   reports that he has never smoked. He has never used smokeless tobacco. He reports that he does not drink alcohol or use illicit drugs.  Family History  Problem Relation Age of Onset  . Depression Father   . Cancer Mother   . Heart disease Mother     Medications: Patient's Medications  New Prescriptions   No medications on file  Previous Medications   ACETAMINOPHEN (TYLENOL) 325 MG TABLET    Take 650 mg by mouth every 6 (six) hours as needed for mild pain.   ALBUTEROL (PROVENTIL) (2.5 MG/3ML) 0.083% NEBULIZER SOLUTION    Take 3 mLs (2.5 mg total) by nebulization every 2 (two) hours as needed for wheezing or shortness of breath.   AMLODIPINE (NORVASC) 5 MG TABLET    Take 1 tablet (5 mg total) by mouth daily.   ASPIRIN 81 MG CHEWABLE TABLET    Chew 81 mg by mouth daily.   BISACODYL (DULCOLAX) 10 MG  SUPPOSITORY    Place 10 mg rectally daily as needed for moderate constipation.   CARBIDOPA-LEVODOPA (SINEMET IR) 25-250 MG PER TABLET    Take 1 tablet by mouth 3 (three) times daily.   CLONAZEPAM (KLONOPIN) 1 MG TABLET    Take 1 mg by mouth 2 (two) times daily. 1/2 tablet at noon and 1 tablet at hs   ENTACAPONE (COMTAN) 200 MG TABLET    Take 200 mg by mouth 3 (three) times daily.   FLUTICASONE (FLONASE) 50 MCG/ACT NASAL SPRAY    Place 2 sprays into both nostrils daily.   GLUCERNA (GLUCERNA) LIQD    Take 237 mLs by mouth daily.    GUAIFENESIN (MUCINEX) 600 MG 12 HR TABLET    Take 1 tablet (600 mg total) by mouth 2 (two) times daily.    HYDRALAZINE (APRESOLINE) 25 MG TABLET    Take 25 mg by mouth 3 (three) times daily.   HYDROCORTISONE 2.5 % CREAM    Apply 1 application topically as needed (for rash).   IPRATROPIUM-ALBUTEROL (DUONEB) 0.5-2.5 (3) MG/3ML SOLN    Take 3 mLs by nebulization 3 (three) times daily.   LAMOTRIGINE (LAMICTAL) 150 MG TABLET    Take 1 tablet (150 mg total) by mouth 2 (two) times daily.   LORATADINE (CLARITIN) 10 MG TABLET    Take 10 mg by mouth daily as needed for allergies.   MINERAL OIL-HYDROPHILIC PETROLATUM (AQUAPHOR) OINTMENT    Apply 1 application topically 2 (two) times daily as needed for dry skin. For dry skin on heels   MULTIPLE VITAMIN (MULTIVITAMIN WITH MINERALS) TABS TABLET    Take 1 tablet by mouth daily.   PANTOPRAZOLE (PROTONIX) 40 MG TABLET    Take 1 tablet (40 mg total) by mouth daily at 12 noon.   POLYETHYLENE GLYCOL (MIRALAX / GLYCOLAX) PACKET    Take 17 g by mouth daily.   PREDNISONE (DELTASONE) 5 MG TABLET    Take 1 tablet (5 mg total) by mouth daily with breakfast.   QUETIAPINE (SEROQUEL) 50 MG TABLET    Take 1 tablet (50 mg total) by mouth at bedtime.   RED YEAST RICE EXTRACT (RED YEAST RICE PO)    Take 2 tablets by mouth at bedtime.    SERTRALINE (ZOLOFT) 100 MG TABLET    Take 2 tablets (200 mg total) by mouth daily.   SKIN PROTECTANTS, MISC. (DIMETHICONE-ZINC OXIDE) CREAM    Apply topically 2 (two) times daily as needed for dry skin.   SKIN PROTECTANTS, MISC. (EUCERIN) CREAM    Apply 1 application topically 2 (two) times daily.   TRAZODONE (DESYREL) 100 MG TABLET    Take 1 tablet (100 mg total) by mouth at bedtime.   VALERIAN ROOT 530 MG CAPS    Take 1 capsule by mouth daily.  Modified Medications   No medications on file  Discontinued Medications   CIPROFLOXACIN (CIPRO) 500 MG TABLET    Take 1 tablet (500 mg total) by mouth 2 (two) times daily.   CLONAZEPAM (KLONOPIN) 0.5 MG TABLET    Take 1/2 tab at noon and 1 tab at bed time   NITROFURANTOIN, MACROCRYSTAL-MONOHYDRATE,  (MACROBID) 100 MG CAPSULE    Take 1 capsule (100 mg total) by mouth 2 (two) times daily.     Physical Exam: Filed Vitals:   04/20/15 1523  BP: 126/72  Pulse: 78  Temp: 98 F (36.7 C)  Resp: 20  Weight: 160 lb (72.576 kg)    Physical Exam  Constitutional: He  is oriented to person, place, and time. He appears well-developed and well-nourished. No distress.  HENT:  Head: Normocephalic and atraumatic.  Neck: Normal range of motion. Neck supple.  Cardiovascular: Normal rate, regular rhythm and normal heart sounds.   Pulmonary/Chest: Effort normal and breath sounds normal.  Abdominal: Soft. Bowel sounds are normal.  Musculoskeletal: He exhibits no edema or tenderness.  Neurological: He is alert and oriented to person, place, and time.  Skin: Skin is warm and dry. He is not diaphoretic.  Psychiatric: His mood appears anxious.    Labs reviewed: Basic Metabolic Panel:  Recent Labs  03/16/15 0416  03/20/15 0315 03/21/15 0440 03/22/15 0330  NA  --   < > 140 137 139  K  --   < > 5.2* 5.3* 5.4*  CL  --   < > 105 101 105  CO2  --   < > 30 27 28   GLUCOSE  --   < > 150* 147* 130*  BUN  --   < > 33* 37* 39*  CREATININE  --   < > 1.07 1.20 1.24  CALCIUM  --   < > 8.8 8.8 8.8  MG 2.0  --   --   --   --   < > = values in this interval not displayed. Liver Function Tests:  Recent Labs  07/24/14 2335 09/10/14 1913 03/16/15 0403  AST 18 15 54*  ALT 13 10 22   ALKPHOS 86 104 64  BILITOT 0.2* 0.2* 0.7  PROT 6.0 6.3 4.9*  ALBUMIN 3.9 4.0 3.4*   No results for input(s): LIPASE, AMYLASE in the last 8760 hours. No results for input(s): AMMONIA in the last 8760 hours. CBC:  Recent Labs  07/24/14 2335  03/19/15 0355 03/20/15 0315 03/22/15 0330  WBC 10.4  < > 4.1 6.1 10.1  NEUTROABS 8.2*  --   --   --   --   HGB 14.0  < > 11.7* 11.7* 11.6*  HCT 40.4  < > 36.2* 35.6* 36.0*  MCV 87.4  < > 91.4 90.6 90.2  PLT 228  < > 154 194 279  < > = values in this interval not  displayed. TSH: No results for input(s): TSH in the last 8760 hours. A1C: Lab Results  Component Value Date   HGBA1C 5.5 03/16/2015   Lipid Panel: No results for input(s): CHOL, HDL, LDLCALC, TRIG, CHOLHDL, LDLDIRECT in the last 8760 hours.  Radiological Exams: Dg Chest 1 View  03/16/2015   CLINICAL DATA:  Status post fall, with altered mental status. Initial encounter.  EXAM: CHEST  1 VIEW  COMPARISON:  Chest radiograph performed 03/12/2015  FINDINGS: The lungs are well-aerated. Vascular congestion is noted. No pleural effusion or pneumothorax is seen.  The cardiomediastinal silhouette is borderline normal in size. No acute osseous abnormalities are seen. There is chronic deformity involving left-sided ribs. Chronic degenerative change is noted at the glenohumeral joints bilaterally.  IMPRESSION: Vascular congestion noted. Lungs remain grossly clear. No acute displaced rib fracture seen.   Electronically Signed   By: Garald Balding M.D.   On: 03/16/2015 05:05   Dg Pelvis 1-2 Views  03/16/2015   CLINICAL DATA:  Status post fall, with altered mental status. Initial encounter.  EXAM: PELVIS - 1-2 VIEW  COMPARISON:  Abdominal radiograph performed 10/01/2009  FINDINGS: There is no evidence of fracture or dislocation. Bilateral hip hemiarthroplasties are grossly unremarkable, though incompletely imaged. Mild degenerative change is noted at the lower lumbar spine. The  sacroiliac joints are unremarkable in appearance.  The visualized bowel gas pattern is grossly unremarkable in appearance.  IMPRESSION: No evidence of fracture or dislocation. Visualized hardware appears grossly intact.   Electronically Signed   By: Garald Balding M.D.   On: 03/16/2015 05:07   Ct Head Wo Contrast  03/16/2015   CLINICAL DATA:  Found on floor; unwitnessed fall. Concern for head or cervical spine injury. Initial encounter.  EXAM: CT HEAD WITHOUT CONTRAST  CT CERVICAL SPINE WITHOUT CONTRAST  TECHNIQUE: Multidetector CT imaging  of the head and cervical spine was performed following the standard protocol without intravenous contrast. Multiplanar CT image reconstructions of the cervical spine were also generated.  COMPARISON:  CT of the cervical spine performed 01/30/2013, MRI of the brain performed 01/31/2013, and CT of the head performed 03/15/2015  FINDINGS: CT HEAD FINDINGS  There is no evidence of acute infarction, mass lesion, or intra- or extra-axial hemorrhage on CT.  Prominence of the ventricles and sulci reflects moderate cortical volume loss. Cerebellar atrophy is noted. 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 Scattered periventricular and subcortical white matter change likely reflects small vessel ischemic microangiopathy.  The brainstem and fourth ventricle are within normal limits. The basal ganglia are unremarkable in appearance. The cerebral hemispheres demonstrate grossly normal gray-white differentiation. No mass effect or midline shift is seen.  There is no evidence of fracture; visualized osseous structures are unremarkable in appearance. The orbits are within normal limits. Mild mucosal thickening is noted at the sphenoid sinus. The remaining paranasal sinuses and mastoid air cells are well-aerated. No significant soft tissue abnormalities are seen.  CT CERVICAL SPINE FINDINGS  There is no evidence of fracture or subluxation. Vertebral bodies demonstrate normal height and alignment. Minimal disc space narrowing is noted along the lower cervical spine, with scattered anterior and posterior disc osteophyte complexes. Prevertebral soft tissues are within normal limits.  The visualized portions of the thyroid gland are unremarkable in appearance. The visualized lung apices are clear. Scattered calcification is noted at the carotid bifurcations bilaterally.  IMPRESSION: 1. No evidence of traumatic intracranial injury or fracture. 2. No evidence of fracture or subluxation along the  cervical spine. 3. Moderate cortical volume loss and scattered small vessel ischemic microangiopathy. 4. 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 5. Mild mucosal thickening at the sphenoid sinus. 6. Minimal degenerative change along the lower cervical spine. 7. Scattered calcification at the carotid bifurcations bilaterally. Carotid ultrasound would be helpful for further evaluation, when and as deemed clinically appropriate.   Electronically Signed   By: Garald Balding M.D.   On: 03/16/2015 05:16   Ct Cervical Spine Wo Contrast  03/16/2015   CLINICAL DATA:  Found on floor; unwitnessed fall. Concern for head or cervical spine injury. Initial encounter.  EXAM: CT HEAD WITHOUT CONTRAST  CT CERVICAL SPINE WITHOUT CONTRAST  TECHNIQUE: Multidetector CT imaging of the head and cervical spine was performed following the standard protocol without intravenous contrast. Multiplanar CT image reconstructions of the cervical spine were also generated.  COMPARISON:  CT of the cervical spine performed 01/30/2013, MRI of the brain performed 01/31/2013, and CT of the head performed 03/15/2015  FINDINGS: CT HEAD FINDINGS  There is no evidence of acute infarction, mass lesion, or intra- or extra-axial hemorrhage on CT.  Prominence of the ventricles and sulci reflects moderate cortical volume loss. Cerebellar atrophy is noted. 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 Scattered periventricular  and subcortical white matter change likely reflects small vessel ischemic microangiopathy.  The brainstem and fourth ventricle are within normal limits. The basal ganglia are unremarkable in appearance. The cerebral hemispheres demonstrate grossly normal gray-white differentiation. No mass effect or midline shift is seen.  There is no evidence of fracture; visualized osseous structures are unremarkable in appearance. The orbits are within normal limits. Mild mucosal  thickening is noted at the sphenoid sinus. The remaining paranasal sinuses and mastoid air cells are well-aerated. No significant soft tissue abnormalities are seen.  CT CERVICAL SPINE FINDINGS  There is no evidence of fracture or subluxation. Vertebral bodies demonstrate normal height and alignment. Minimal disc space narrowing is noted along the lower cervical spine, with scattered anterior and posterior disc osteophyte complexes. Prevertebral soft tissues are within normal limits.  The visualized portions of the thyroid gland are unremarkable in appearance. The visualized lung apices are clear. Scattered calcification is noted at the carotid bifurcations bilaterally.  IMPRESSION: 1. No evidence of traumatic intracranial injury or fracture. 2. No evidence of fracture or subluxation along the cervical spine. 3. Moderate cortical volume loss and scattered small vessel ischemic microangiopathy. 4. 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 5. Mild mucosal thickening at the sphenoid sinus. 6. Minimal degenerative change along the lower cervical spine. 7. Scattered calcification at the carotid bifurcations bilaterally. Carotid ultrasound would be helpful for further evaluation, when and as deemed clinically appropriate.   Electronically Signed   By: Garald Balding M.D.   On: 03/16/2015 05:16   Assessment/Plan 1. Essential hypertension Controlled on current regimen  2. Chronic diastolic congestive heart failure Noted on chest xray, without any symptoms of shortness of breath or fluid retention.   3. Influenza A (H1N1) Completed tamiflu, no longer with any respiratory symptoms.   4. Acute Delirium   Noted from hospitalization, this has improved and cognitive status back to baseline.  5. Parkinson disease Following with neurology, will cont outpt medications  6. Bipolar affective disorder, current episode mixed, current episode severity unspecified Follows with  behavioral health. To cont current regimen.   7. Falls Noted from hospitalization which was thought to be progression of parkinson vs weakness from acute flu.  Acute weakness has improved with therapy, uses WC and able to transfer from bed to [facility] without difficulty.   pt is stable for discharge-will need PT/OT/ST per home health. No DME needed. Rx written.  will need to follow up with PCP within 2 weeks.

## 2015-05-14 ENCOUNTER — Ambulatory Visit: Payer: Self-pay | Admitting: Internal Medicine

## 2015-05-31 ENCOUNTER — Encounter: Payer: Self-pay | Admitting: Neurology

## 2015-05-31 ENCOUNTER — Ambulatory Visit (INDEPENDENT_AMBULATORY_CARE_PROVIDER_SITE_OTHER): Payer: Medicare Other | Admitting: Neurology

## 2015-05-31 VITALS — BP 159/81 | HR 70 | Ht 71.0 in | Wt 158.8 lb

## 2015-05-31 DIAGNOSIS — R413 Other amnesia: Secondary | ICD-10-CM

## 2015-05-31 DIAGNOSIS — G20 Parkinson's disease: Secondary | ICD-10-CM

## 2015-05-31 DIAGNOSIS — F0390 Unspecified dementia without behavioral disturbance: Secondary | ICD-10-CM | POA: Diagnosis not present

## 2015-05-31 MED ORDER — MEMANTINE HCL 28 X 5 MG & 21 X 10 MG PO TABS
ORAL_TABLET | ORAL | Status: DC
Start: 2015-05-31 — End: 2015-10-07

## 2015-05-31 NOTE — Progress Notes (Signed)
Reason for visit: Parkinson's disease  [name] is an 80 y.o. male  History of present illness:  [name] is a 80 year old right-handed white male with a history of Parkinson's disease associated with a gait disorder. The patient uses a walker for ambulation. Since last seen, the patient has made an excellent recovery, previously he was unable to walk at all. He is now at or near his baseline with his ability to ambulate and with his cognitive processing. The patient indicates that he is having ongoing issues with memory, repeats himself frequently. The patient has developed some tremors that he notices while feeding himself. These are not significant at this point. The patient is not having falls. He returns for further evaluation. He remains on Sinemet taking the 25/250 mg tablets 3 times daily along with Comtan 200 mg 3 times daily. He is not on any medications for memory. Indicates that he has lost about 6 pounds since last seen.  Past Medical History  Diagnosis Date  . Hearing aid worn   . Hypertension   . Parkinson disease   . Pneumonia   . Diabetes mellitus type II   . Arthritis   . Bipolar disorder   . Anxiety   . Depression   . MRSA infection (methicillin-resistant Staphylococcus aureus)   . Chronic indwelling Foley catheter   . C2 cervical fracture     due to pt fall  . SIRS (systemic inflammatory response syndrome)   . Memory loss   . Benign prostate hyperplasia   . Memory difficulty 08/21/2014  . Neuromuscular disorder     parkinsons  . Flu   . Falls   . UTI (urinary tract infection)     Past Surgical History  Procedure Laterality Date  . Total hip arthroplasty    . Video bronchoscopy  12/16/2012    Procedure: VIDEO BRONCHOSCOPY;  Surgeon: Melrose Nakayama, MD;  Location: [place];  Service: Thoracic;  Laterality: Right;  . Video assisted thoracoscopy (vats)/empyema  12/16/2012    Procedure: VIDEO ASSISTED THORACOSCOPY (VATS)/EMPYEMA;  Surgeon:  Melrose Nakayama, MD;  Location: [place];  Service: Thoracic;  Laterality: Right;    Family History  Problem Relation Age of Onset  . Depression Father   . Cancer Mother   . Heart disease Mother     Social history:  reports that he has never smoked. He has never used smokeless tobacco. He reports that he does not drink alcohol or use illicit drugs.    Allergies  Allergen Reactions  . Cholestatin Other (See Comments)    Per [facility]    Medications:  Prior to Admission medications   Medication Sig Start Date End Date Taking? Authorizing Provider  acetaminophen (TYLENOL) 325 MG tablet Take 650 mg by mouth every 6 (six) hours as needed for mild pain.   Yes Historical Provider, MD  albuterol (PROVENTIL) (2.5 MG/3ML) 0.083% nebulizer solution Take 3 mLs (2.5 mg total) by nebulization every 2 (two) hours as needed for wheezing or shortness of breath. 04/21/14  Yes Hosie Poisson, MD  amLODipine (NORVASC) 5 MG tablet Take 1 tablet (5 mg total) by mouth daily. 02/03/13  Yes Janece Canterbury, MD  aspirin 81 MG chewable tablet Chew 81 mg by mouth daily.   Yes Historical Provider, MD  bisacodyl (DULCOLAX) 10 MG suppository Place 10 mg rectally daily as needed for moderate constipation.   Yes Historical Provider, MD  carbidopa-levodopa (SINEMET IR) 25-250 MG per tablet Take 1 tablet by mouth  3 (three) times daily.   Yes Historical Provider, MD  clonazePAM (KLONOPIN) 1 MG tablet Take 1 mg by mouth 2 (two) times daily. 1/2 tablet at noon and 1 tablet at hs   Yes Historical Provider, MD  entacapone (COMTAN) 200 MG tablet Take 200 mg by mouth 3 (three) times daily.   Yes Historical Provider, MD  fluticasone (FLONASE) 50 MCG/ACT nasal spray Place 2 sprays into both nostrils daily.   Yes Historical Provider, MD  GLUCERNA (GLUCERNA) LIQD Take 237 mLs by mouth daily.    Yes Historical Provider, MD  guaiFENesin (MUCINEX) 600 MG 12 hr tablet Take 1 tablet (600 mg total) by mouth 2 (two) times daily. 04/21/14  Yes  Hosie Poisson, MD  hydrALAZINE (APRESOLINE) 25 MG tablet Take 25 mg by mouth 3 (three) times daily.   Yes Historical Provider, MD  ipratropium-albuterol (DUONEB) 0.5-2.5 (3) MG/3ML SOLN Take 3 mLs by nebulization 3 (three) times daily. Patient taking differently: Take 3 mLs by nebulization as needed (for breathing).  04/21/14  Yes Hosie Poisson, MD  lamoTRIgine (LAMICTAL) 150 MG tablet Take 1 tablet (150 mg total) by mouth 2 (two) times daily. 12/02/14  Yes Kathlee Nations, MD  loratadine (CLARITIN) 10 MG tablet Take 10 mg by mouth daily as needed for allergies.   Yes Historical Provider, MD  mineral oil-hydrophilic petrolatum (AQUAPHOR) ointment Apply 1 application topically 2 (two) times daily as needed for dry skin. For dry skin on heels   Yes Historical Provider, MD  Multiple Vitamin (MULTIVITAMIN WITH MINERALS) TABS tablet Take 1 tablet by mouth daily.   Yes Historical Provider, MD  pantoprazole (PROTONIX) 40 MG tablet Take 1 tablet (40 mg total) by mouth daily at 12 noon. 04/21/14  Yes Hosie Poisson, MD  polyethylene glycol (MIRALAX / GLYCOLAX) packet Take 17 g by mouth daily.   Yes Historical Provider, MD  predniSONE (DELTASONE) 5 MG tablet Take 1 tablet (5 mg total) by mouth daily with breakfast. 03/22/15  Yes Donne Hazel, MD  QUEtiapine (SEROQUEL) 50 MG tablet Take 1 tablet (50 mg total) by mouth at bedtime. Patient taking differently: Take 25 mg by mouth at bedtime.  12/02/14  Yes Kathlee Nations, MD  sertraline (ZOLOFT) 100 MG tablet Take 2 tablets (200 mg total) by mouth daily. 10/01/14  Yes Kathlee Nations, MD  Skin Protectants, Misc. (DIMETHICONE-ZINC OXIDE) cream Apply topically 2 (two) times daily as needed for dry skin.   Yes Historical Provider, MD  traZODone (DESYREL) 100 MG tablet Take 1 tablet (100 mg total) by mouth at bedtime. 12/02/14  Yes Kathlee Nations, MD  Valerian Root 530 MG CAPS Take 1 capsule by mouth daily.   Yes Historical Provider, MD    ROS:  Out of a complete 14 system  review of symptoms, the patient complains only of the following symptoms, and all other reviewed systems are negative.  Hearing loss Memory problems Decreased concentration  Blood pressure 159/81, pulse 70, height 5\' 11"  (1.803 m), weight 158 lb 12.8 oz (72.031 kg).  Physical Exam  General: The patient is alert and cooperative at the time of the examination.  Skin: No significant peripheral edema is noted.   Neurologic Exam  Mental status: The patient is alert and oriented x 2 at the time of the examination (the patient is not oriented to date). The patient has apparent normal recent and remote memory, with an apparently normal attention span and concentration ability. Mini-Mental Status Examination done today shows a total  score 23/30. The patient is able to name 5 animals in 30 seconds.   Cranial nerves: Facial symmetry is present. Speech is normal, no aphasia or dysarthria is noted. Extraocular movements are full. Visual fields are full. Very mild masking of the face is seen.  Motor: The patient has good strength in all 4 extremities.  Sensory examination: Soft touch sensation is symmetric on the face, arms, and legs.  Coordination: The patient has good finger-nose-finger and heel-to-shin bilaterally.  Gait and station: The patient has the ability to ambulate independently with using a walker. The patient has fairly good stride and good turns. Romberg is negative. No drift is seen.  Reflexes: Deep tendon reflexes are symmetric.   Assessment/Plan:  1. Parkinson's disease  2. Memory disturbance  3. Gait disturbance  The patient wishes to go on a medication for memory. We will start Namenda at this time. If he tolerates the titration pack, we will go on maintenance dosing. He will follow-up in 6 months. No changes with the Sinemet have been made. The patient has made an excellent recovery from the viral infection he had in April.  Jill Alexanders MD 05/31/2015 7:31  PM  [facility] [address] [place], [place] [address]  Phone [phone] Fax [phone]

## 2015-05-31 NOTE — Patient Instructions (Signed)

## 2015-07-29 ENCOUNTER — Ambulatory Visit: Payer: Medicare Other | Admitting: Neurology

## 2015-08-25 ENCOUNTER — Emergency Department (HOSPITAL_COMMUNITY)
Admission: EM | Admit: 2015-08-25 | Discharge: 2015-08-25 | Disposition: A | Discharge status: Home / Self Care | Payer: Medicare Other | Attending: Emergency Medicine | Admitting: Emergency Medicine

## 2015-08-25 ENCOUNTER — Encounter (HOSPITAL_COMMUNITY): Payer: Self-pay | Admitting: Emergency Medicine

## 2015-08-25 ENCOUNTER — Emergency Department (HOSPITAL_COMMUNITY): Payer: Medicare Other

## 2015-08-25 DIAGNOSIS — G20 Parkinson's disease: Secondary | ICD-10-CM | POA: Insufficient documentation

## 2015-08-25 DIAGNOSIS — F419 Anxiety disorder, unspecified: Secondary | ICD-10-CM | POA: Diagnosis not present

## 2015-08-25 DIAGNOSIS — E119 Type 2 diabetes mellitus without complications: Secondary | ICD-10-CM | POA: Insufficient documentation

## 2015-08-25 DIAGNOSIS — Z7982 Long term (current) use of aspirin: Secondary | ICD-10-CM | POA: Diagnosis not present

## 2015-08-25 DIAGNOSIS — Y939 Activity, unspecified: Secondary | ICD-10-CM | POA: Diagnosis not present

## 2015-08-25 DIAGNOSIS — Y92129 Unspecified place in nursing home as the place of occurrence of the external cause: Secondary | ICD-10-CM | POA: Diagnosis not present

## 2015-08-25 DIAGNOSIS — Z87828 Personal history of other (healed) physical injury and trauma: Secondary | ICD-10-CM | POA: Insufficient documentation

## 2015-08-25 DIAGNOSIS — I10 Essential (primary) hypertension: Secondary | ICD-10-CM | POA: Diagnosis not present

## 2015-08-25 DIAGNOSIS — S40812A Abrasion of left upper arm, initial encounter: Secondary | ICD-10-CM | POA: Insufficient documentation

## 2015-08-25 DIAGNOSIS — M1990 Unspecified osteoarthritis, unspecified site: Secondary | ICD-10-CM | POA: Insufficient documentation

## 2015-08-25 DIAGNOSIS — Z87440 Personal history of urinary (tract) infections: Secondary | ICD-10-CM | POA: Insufficient documentation

## 2015-08-25 DIAGNOSIS — S0181XA Laceration without foreign body of other part of head, initial encounter: Secondary | ICD-10-CM | POA: Diagnosis not present

## 2015-08-25 DIAGNOSIS — Z9889 Other specified postprocedural states: Secondary | ICD-10-CM | POA: Diagnosis not present

## 2015-08-25 DIAGNOSIS — S61412A Laceration without foreign body of left hand, initial encounter: Secondary | ICD-10-CM | POA: Diagnosis not present

## 2015-08-25 DIAGNOSIS — Z87438 Personal history of other diseases of male genital organs: Secondary | ICD-10-CM | POA: Insufficient documentation

## 2015-08-25 DIAGNOSIS — Z7952 Long term (current) use of systemic steroids: Secondary | ICD-10-CM | POA: Diagnosis not present

## 2015-08-25 DIAGNOSIS — Z8619 Personal history of other infectious and parasitic diseases: Secondary | ICD-10-CM | POA: Diagnosis not present

## 2015-08-25 DIAGNOSIS — S0990XA Unspecified injury of head, initial encounter: Secondary | ICD-10-CM | POA: Diagnosis present

## 2015-08-25 DIAGNOSIS — Z8614 Personal history of Methicillin resistant Staphylococcus aureus infection: Secondary | ICD-10-CM | POA: Insufficient documentation

## 2015-08-25 DIAGNOSIS — Z8781 Personal history of (healed) traumatic fracture: Secondary | ICD-10-CM | POA: Diagnosis not present

## 2015-08-25 DIAGNOSIS — S41112A Laceration without foreign body of left upper arm, initial encounter: Secondary | ICD-10-CM | POA: Diagnosis not present

## 2015-08-25 DIAGNOSIS — W19XXXA Unspecified fall, initial encounter: Secondary | ICD-10-CM | POA: Insufficient documentation

## 2015-08-25 DIAGNOSIS — Y999 Unspecified external cause status: Secondary | ICD-10-CM | POA: Insufficient documentation

## 2015-08-25 DIAGNOSIS — Z79899 Other long term (current) drug therapy: Secondary | ICD-10-CM | POA: Diagnosis not present

## 2015-08-25 DIAGNOSIS — S60512A Abrasion of left hand, initial encounter: Secondary | ICD-10-CM | POA: Diagnosis not present

## 2015-08-25 DIAGNOSIS — Z8701 Personal history of pneumonia (recurrent): Secondary | ICD-10-CM | POA: Diagnosis not present

## 2015-08-25 DIAGNOSIS — F319 Bipolar disorder, unspecified: Secondary | ICD-10-CM | POA: Diagnosis not present

## 2015-08-25 NOTE — ED Notes (Signed)
Pt fell last night unwitnessed. Pt was getting back in bed from bathroom denies that he was dizzy. Staff found pt beside his bed on floor this morning. Pt has lac above left eye with skin tears on left arm/hand. Pt is alert and oriented x4. PT denies pain. Pt denies losing consciousness. BP 144/78. HR 80, 97% on room air, CBG 114

## 2015-08-25 NOTE — ED Notes (Signed)
PT ambulated with baseline gait; VSS; A&Ox3; no signs of distress; respirations even and unlabored; skin warm and dry; no questions upon discharge.  

## 2015-08-25 NOTE — ED Provider Notes (Signed)
CSN: [id number]     Arrival date & time 08/25/15  0746 History   First MD Initiated Contact with Patient 08/25/15 [phone]     Chief Complaint  Patient presents with  . Fall    HPI  [name] is a 80 y.o. male with PMH of parkinson's disease who presented to the ED for fall. Patient had an unwitnessed fall last night. Patient states he was trying to get in bed. Patient lives in an assisted living facility. Staff found patient on the floor this morning.   Patient is unaware of how he fell or what part of his body might have been hit. He does not recall hitting anything in particular. He states that he was laying on the floor for about 2 hours before being found. He states he remembers being very tired and can't remember much of anything from the night before. He states the abrasions are from him trying to get up off the carpeted floor. Denies any symptoms of dizziness. Has fallen before. States his balance is not perfect due to his Parkinson's. He has walker and wheelchair at facility. Denies use of blood thinners.   Of note, patient upset about falling and is ashamed. He feels like he let people down.    Past Medical History  Diagnosis Date  . Hearing aid worn   . Hypertension   . Parkinson disease   . Pneumonia   . Diabetes mellitus type II   . Arthritis   . Bipolar disorder   . Anxiety   . Depression   . MRSA infection (methicillin-resistant Staphylococcus aureus)   . Chronic indwelling Foley catheter   . C2 cervical fracture     due to pt fall  . SIRS (systemic inflammatory response syndrome)   . Memory loss   . Benign prostate hyperplasia   . Memory difficulty 08/21/2014  . Neuromuscular disorder     parkinsons  . Flu   . Falls   . UTI (urinary tract infection)    Past Surgical History  Procedure Laterality Date  . Total hip arthroplasty    . Video bronchoscopy  12/16/2012    Procedure: VIDEO BRONCHOSCOPY;  Surgeon: Melrose Nakayama, MD;  Location: [facility];  Service:  Thoracic;  Laterality: Right;  . Video assisted thoracoscopy (vats)/empyema  12/16/2012    Procedure: VIDEO ASSISTED THORACOSCOPY (VATS)/EMPYEMA;  Surgeon: Melrose Nakayama, MD;  Location: [facility];  Service: Thoracic;  Laterality: Right;   Family History  Problem Relation Age of Onset  . Depression Father   . Cancer Mother   . Heart disease Mother    Social History  Substance Use Topics  . Smoking status: Never Smoker   . Smokeless tobacco: Never Used  . Alcohol Use: No     Comment: occasional    Review of Systems  Constitutional: Positive for fatigue. Negative for fever.  Respiratory: Negative for shortness of breath.   Cardiovascular: Negative for chest pain.  Gastrointestinal: Negative for abdominal pain.  Musculoskeletal: Negative for back pain and neck pain.  Neurological: Negative for dizziness and headaches.  Also per HPI  Allergies  Cholestatin  Home Medications   Prior to Admission medications   Medication Sig Start Date End Date Taking? Authorizing Provider  acetaminophen (TYLENOL) 325 MG tablet Take 650 mg by mouth every 6 (six) hours as needed for mild pain.   Yes Historical Provider, MD  amLODipine (NORVASC) 5 MG tablet Take 1 tablet (5 mg total) by mouth daily. 02/03/13  Yes Janece Canterbury, MD  aspirin 81 MG chewable tablet Chew 81 mg by mouth daily.   Yes Historical Provider, MD  bisacodyl (DULCOLAX) 10 MG suppository Place 10 mg rectally daily as needed for moderate constipation.   Yes Historical Provider, MD  carbidopa-levodopa (SINEMET IR) 25-250 MG per tablet Take 1 tablet by mouth 3 (three) times daily.   Yes Historical Provider, MD  clonazePAM (KLONOPIN) 0.5 MG tablet Take 0.5 mg by mouth at bedtime.   Yes Historical Provider, MD  entacapone (COMTAN) 200 MG tablet Take 200 mg by mouth 3 (three) times daily.   Yes Historical Provider, MD  famotidine (PEPCID) 20 MG tablet Take 20 mg by mouth 2 (two) times daily.   Yes Historical Provider, MD  GLUCERNA  (GLUCERNA) LIQD Take 237 mLs by mouth daily.    Yes Historical Provider, MD  hydrALAZINE (APRESOLINE) 25 MG tablet Take 25 mg by mouth 2 (two) times daily.    Yes Historical Provider, MD  lamoTRIgine (LAMICTAL) 150 MG tablet Take 1 tablet (150 mg total) by mouth 2 (two) times daily. 12/02/14  Yes Kathlee Nations, MD  lisinopril (PRINIVIL,ZESTRIL) 10 MG tablet Take 10 mg by mouth daily.   Yes Historical Provider, MD  loratadine (CLARITIN) 10 MG tablet Take 10 mg by mouth daily as needed for allergies.   Yes Historical Provider, MD  Multiple Vitamin (MULTIVITAMIN WITH MINERALS) TABS tablet Take 1 tablet by mouth daily.   Yes Historical Provider, MD  predniSONE (DELTASONE) 2.5 MG tablet Take 2.5 mg by mouth daily with breakfast.   Yes Historical Provider, MD  QUEtiapine (SEROQUEL) 25 MG tablet Take 75 mg by mouth at bedtime.   Yes Historical Provider, MD  sertraline (ZOLOFT) 100 MG tablet Take 2 tablets (200 mg total) by mouth daily. 10/01/14  Yes Kathlee Nations, MD  Skin Protectants, Misc. (DIMETHICONE-ZINC OXIDE) cream Apply topically 2 (two) times daily as needed for dry skin.   Yes Historical Provider, MD  traZODone (DESYREL) 100 MG tablet Take 1 tablet (100 mg total) by mouth at bedtime. 12/02/14  Yes Kathlee Nations, MD  albuterol (PROVENTIL) (2.5 MG/3ML) 0.083% nebulizer solution Take 3 mLs (2.5 mg total) by nebulization every 2 (two) hours as needed for wheezing or shortness of breath. Patient not taking: Reported on 08/25/2015 04/21/14   Hosie Poisson, MD  guaiFENesin (MUCINEX) 600 MG 12 hr tablet Take 1 tablet (600 mg total) by mouth 2 (two) times daily. Patient not taking: Reported on 08/25/2015 04/21/14   Hosie Poisson, MD  ipratropium-albuterol (DUONEB) 0.5-2.5 (3) MG/3ML SOLN Take 3 mLs by nebulization 3 (three) times daily. Patient not taking: Reported on 08/25/2015 04/21/14   Hosie Poisson, MD  memantine [facility] TITRATION PAK) tablet pack 5 mg/day for =1 week; 5 mg twice daily for =1 week; 15 mg/day given  in 5 mg and 10 mg separated doses for =1 week; then 10 mg twice daily Patient not taking: Reported on 08/25/2015 05/31/15   Kathrynn Ducking, MD  pantoprazole (PROTONIX) 40 MG tablet Take 1 tablet (40 mg total) by mouth daily at 12 noon. Patient not taking: Reported on 08/25/2015 04/21/14   Hosie Poisson, MD  predniSONE (DELTASONE) 5 MG tablet Take 1 tablet (5 mg total) by mouth daily with breakfast. Patient not taking: Reported on 08/25/2015 03/22/15   Donne Hazel, MD  QUEtiapine (SEROQUEL) 50 MG tablet Take 1 tablet (50 mg total) by mouth at bedtime. Patient taking differently: Take 25 mg by mouth at bedtime.  12/02/14   Kathlee Nations, MD   BP 120/64 mmHg  Pulse 68  Temp(Src) 97.8 F (36.6 C) (Oral)  Resp 20  Ht 5\' 10"  (1.778 m)  Wt 164 lb (74.39 kg)  BMI 23.53 kg/m2  SpO2 96% Physical Exam  Constitutional: He is oriented to person, place, and time. He appears well-developed and well-nourished. No distress.  HENT:  Head: Normocephalic. Head is with abrasion and with laceration.    Eyes: Conjunctivae and EOM are normal. Pupils are equal, round, and reactive to light.  1cm bruise appreciated on lateral aspect of left eye.   Neck: Normal range of motion. Neck supple.  Cardiovascular: Normal rate, regular rhythm and intact distal pulses.   Pulmonary/Chest: Effort normal and breath sounds normal.  Abdominal: Soft. Bowel sounds are normal. He exhibits no distension. There is no tenderness. There is no rebound and no guarding.  Genitourinary:  Foley catheter in place  Musculoskeletal: Normal range of motion. He exhibits no edema.  Neurological: He is alert and oriented to person, place, and time. He has normal strength. No cranial nerve deficit or sensory deficit. Gait abnormal.  Skin: Skin is warm and dry. Abrasion, bruising and laceration noted.  Left arm and hand with multiple small hemostatic lacerations.     ED Course  Procedures (including critical care time) Labs Review Labs  Reviewed - No data to display  Imaging Review Ct Head Wo Contrast  08/25/2015   CLINICAL DATA:  Head laceration after a unwitnessed fall.  EXAM: CT HEAD WITHOUT CONTRAST  CT CERVICAL SPINE WITHOUT CONTRAST  TECHNIQUE: Multidetector CT imaging of the head and cervical spine was performed following the standard protocol without intravenous contrast. Multiplanar CT image reconstructions of the cervical spine were also generated.  COMPARISON:  CT scan of March 16, 2015.  FINDINGS: CT HEAD FINDINGS  Probable right maxillary mucous retention cyst is noted. Bony calvarium appears intact. Moderate diffuse cortical atrophy is noted. Mild chronic ischemic white matter disease is noted. Stable cisterna magna is noted. No mass effect or midline shift is noted. Ventricular size is within normal limits. There is no evidence of mass lesion, hemorrhage or acute infarction.  CT CERVICAL SPINE FINDINGS  No fracture is noted. Mild degenerative disc disease is noted at C4-5 with anterior osteophyte formation. Moderate degenerative disc disease is noted at C5-6 and C6-7 with anterior osteophyte formation minimal retrolisthesis of C6-7 is noted most likely due to degenerative disc disease. Mild degenerative changes seen involving posterior facet joints. Visualized lung apices appear normal.  IMPRESSION: Moderate diffuse cortical atrophy. Mild chronic ischemic white matter disease. No acute intracranial abnormality seen.  Multilevel degenerative disc disease is noted in the cervical spine. No fracture or other acute abnormality is noted.   Electronically Signed   By: Marijo Conception, M.D.   On: 08/25/2015 10:32   Ct Cervical Spine Wo Contrast  08/25/2015   CLINICAL DATA:  Head laceration after a unwitnessed fall.  EXAM: CT HEAD WITHOUT CONTRAST  CT CERVICAL SPINE WITHOUT CONTRAST  TECHNIQUE: Multidetector CT imaging of the head and cervical spine was performed following the standard protocol without intravenous contrast. Multiplanar  CT image reconstructions of the cervical spine were also generated.  COMPARISON:  CT scan of March 16, 2015.  FINDINGS: CT HEAD FINDINGS  Probable right maxillary mucous retention cyst is noted. Bony calvarium appears intact. Moderate diffuse cortical atrophy is noted. Mild chronic ischemic white matter disease is noted. Stable cisterna magna is noted. No mass effect  or midline shift is noted. Ventricular size is within normal limits. There is no evidence of mass lesion, hemorrhage or acute infarction.  CT CERVICAL SPINE FINDINGS  No fracture is noted. Mild degenerative disc disease is noted at C4-5 with anterior osteophyte formation. Moderate degenerative disc disease is noted at C5-6 and C6-7 with anterior osteophyte formation minimal retrolisthesis of C6-7 is noted most likely due to degenerative disc disease. Mild degenerative changes seen involving posterior facet joints. Visualized lung apices appear normal.  IMPRESSION: Moderate diffuse cortical atrophy. Mild chronic ischemic white matter disease. No acute intracranial abnormality seen.  Multilevel degenerative disc disease is noted in the cervical spine. No fracture or other acute abnormality is noted.   Electronically Signed   By: Marijo Conception, M.D.   On: 08/25/2015 10:32   I have personally reviewed and evaluated these images and lab results as part of my medical decision-making.   EKG Interpretation None     MDM   Final diagnoses:  Fall, initial encounter   Patient presented to ED for fall encounter at assisted living facility. Unsure of mechanism of fall due to patient's memory loss. Patient was pain-free. No need for pain medications in the ED. CT head and cervical spine performed to rule out any fractures or bleeding. CT unremarkable. Patient with abrasions and lacerations on left side of head and arm. Wounds were cleaned and dressed in ED. He'll be discharged back to his assisted living with fall precautions.   Luiz Blare,  DO PGY-2, [place], DO 08/25/15 [place], MD 08/25/15 [phone]

## 2015-08-25 NOTE — Discharge Instructions (Signed)
·   Patient's injuries are stable. No broken bones or bleeding identified. No need for sutures.   PRN pain control with tylenol as needed. Did not need pain medicine in ED  Needs fall prevention and risk modifications at assisted living facility.   Fall Prevention and Home Safety Falls cause injuries and can affect all age groups. It is possible to prevent falls.  HOW TO PREVENT FALLS  Wear shoes with rubber soles that do not have an opening for your toes.  Keep the inside and outside of your house well lit.  Use night lights throughout your home.  Remove clutter from floors.  Clean up floor spills.  Remove throw rugs or fasten them to the floor with carpet tape.  Do not place electrical cords across pathways.  Put grab bars by your tub, shower, and toilet. Do not use towel bars as grab bars.  Put handrails on both sides of the stairway. Fix loose handrails.  Do not climb on stools or stepladders, if possible.  Do not wax your floors.  Repair uneven or unsafe sidewalks, walkways, or stairs.  Keep items you use a lot within reach.  Be aware of pets.  Keep emergency numbers next to the telephone.  Put smoke detectors in your home and near bedrooms. Ask your doctor what other things you can do to prevent falls. Document Released: 09/30/2009 Document Revised: 06/04/2012 Document Reviewed: 03/05/2012 [facility] Patient Information 2015 [place], [place]. This information is not intended to replace advice given to you by your health care provider. Make sure you discuss any questions you have with your health care provider.

## 2015-08-25 NOTE — ED Notes (Signed)
Patient transported to CT without distress 

## 2015-08-25 NOTE — ED Notes (Addendum)
Attempted report to [employer]; held for 15 mins.

## 2015-09-30 ENCOUNTER — Inpatient Hospital Stay (HOSPITAL_COMMUNITY)
Admission: EM | Admit: 2015-09-30 | Discharge: 2015-10-04 | DRG: 871 | Disposition: A | Discharge status: Skilled Nursing Facility | Payer: Medicare Other | Attending: Internal Medicine | Admitting: Internal Medicine

## 2015-09-30 ENCOUNTER — Encounter (HOSPITAL_COMMUNITY): Payer: Self-pay | Admitting: Emergency Medicine

## 2015-09-30 ENCOUNTER — Emergency Department (HOSPITAL_COMMUNITY): Payer: Medicare Other

## 2015-09-30 DIAGNOSIS — Y95 Nosocomial condition: Secondary | ICD-10-CM | POA: Diagnosis present

## 2015-09-30 DIAGNOSIS — Z8249 Family history of ischemic heart disease and other diseases of the circulatory system: Secondary | ICD-10-CM

## 2015-09-30 DIAGNOSIS — A4189 Other specified sepsis: Secondary | ICD-10-CM | POA: Diagnosis present

## 2015-09-30 DIAGNOSIS — F3112 Bipolar disorder, current episode manic without psychotic features, moderate: Secondary | ICD-10-CM | POA: Diagnosis not present

## 2015-09-30 DIAGNOSIS — R0602 Shortness of breath: Secondary | ICD-10-CM | POA: Diagnosis present

## 2015-09-30 DIAGNOSIS — E119 Type 2 diabetes mellitus without complications: Secondary | ICD-10-CM

## 2015-09-30 DIAGNOSIS — F0390 Unspecified dementia without behavioral disturbance: Secondary | ICD-10-CM | POA: Diagnosis present

## 2015-09-30 DIAGNOSIS — Z809 Family history of malignant neoplasm, unspecified: Secondary | ICD-10-CM

## 2015-09-30 DIAGNOSIS — G9341 Metabolic encephalopathy: Secondary | ICD-10-CM | POA: Diagnosis present

## 2015-09-30 DIAGNOSIS — Z978 Presence of other specified devices: Secondary | ICD-10-CM

## 2015-09-30 DIAGNOSIS — J9600 Acute respiratory failure, unspecified whether with hypoxia or hypercapnia: Secondary | ICD-10-CM

## 2015-09-30 DIAGNOSIS — Z6822 Body mass index (BMI) 22.0-22.9, adult: Secondary | ICD-10-CM

## 2015-09-30 DIAGNOSIS — I10 Essential (primary) hypertension: Secondary | ICD-10-CM | POA: Diagnosis present

## 2015-09-30 DIAGNOSIS — N179 Acute kidney failure, unspecified: Secondary | ICD-10-CM | POA: Diagnosis present

## 2015-09-30 DIAGNOSIS — Z794 Long term (current) use of insulin: Secondary | ICD-10-CM

## 2015-09-30 DIAGNOSIS — R6520 Severe sepsis without septic shock: Secondary | ICD-10-CM

## 2015-09-30 DIAGNOSIS — M1990 Unspecified osteoarthritis, unspecified site: Secondary | ICD-10-CM | POA: Diagnosis present

## 2015-09-30 DIAGNOSIS — A4159 Other Gram-negative sepsis: Secondary | ICD-10-CM | POA: Diagnosis present

## 2015-09-30 DIAGNOSIS — G20 Parkinson's disease: Secondary | ICD-10-CM | POA: Diagnosis present

## 2015-09-30 DIAGNOSIS — Z960 Presence of urogenital implants: Secondary | ICD-10-CM

## 2015-09-30 DIAGNOSIS — G20A1 Parkinson's disease without dyskinesia, without mention of fluctuations: Secondary | ICD-10-CM | POA: Diagnosis present

## 2015-09-30 DIAGNOSIS — Z66 Do not resuscitate: Secondary | ICD-10-CM | POA: Diagnosis present

## 2015-09-30 DIAGNOSIS — Z9289 Personal history of other medical treatment: Secondary | ICD-10-CM

## 2015-09-30 DIAGNOSIS — I5032 Chronic diastolic (congestive) heart failure: Secondary | ICD-10-CM | POA: Diagnosis present

## 2015-09-30 DIAGNOSIS — L8992 Pressure ulcer of unspecified site, stage 2: Secondary | ICD-10-CM | POA: Diagnosis present

## 2015-09-30 DIAGNOSIS — F319 Bipolar disorder, unspecified: Secondary | ICD-10-CM | POA: Diagnosis present

## 2015-09-30 DIAGNOSIS — E440 Moderate protein-calorie malnutrition: Secondary | ICD-10-CM | POA: Diagnosis present

## 2015-09-30 DIAGNOSIS — I110 Hypertensive heart disease with heart failure: Secondary | ICD-10-CM | POA: Diagnosis present

## 2015-09-30 DIAGNOSIS — L8990 Pressure ulcer of unspecified site, unspecified stage: Secondary | ICD-10-CM | POA: Diagnosis present

## 2015-09-30 DIAGNOSIS — J181 Lobar pneumonia, unspecified organism: Secondary | ICD-10-CM

## 2015-09-30 DIAGNOSIS — Z7982 Long term (current) use of aspirin: Secondary | ICD-10-CM | POA: Diagnosis not present

## 2015-09-30 DIAGNOSIS — E1122 Type 2 diabetes mellitus with diabetic chronic kidney disease: Secondary | ICD-10-CM

## 2015-09-30 DIAGNOSIS — N178 Other acute kidney failure: Secondary | ICD-10-CM | POA: Diagnosis not present

## 2015-09-30 DIAGNOSIS — J189 Pneumonia, unspecified organism: Secondary | ICD-10-CM | POA: Diagnosis present

## 2015-09-30 DIAGNOSIS — A4150 Gram-negative sepsis, unspecified: Secondary | ICD-10-CM | POA: Diagnosis present

## 2015-09-30 DIAGNOSIS — J9601 Acute respiratory failure with hypoxia: Secondary | ICD-10-CM | POA: Diagnosis present

## 2015-09-30 LAB — CBC WITH DIFFERENTIAL/PLATELET
BASOS PCT: 0 %
Basophils Absolute: 0 10*3/uL (ref 0.0–0.1)
EOS ABS: 0 10*3/uL (ref 0.0–0.7)
EOS PCT: 0 %
HCT: 40 % (ref 39.0–52.0)
Hemoglobin: 13 g/dL (ref 13.0–17.0)
LYMPHS ABS: 0.4 10*3/uL — AB (ref 0.7–4.0)
Lymphocytes Relative: 5 %
MCH: 29.2 pg (ref 26.0–34.0)
MCHC: 32.5 g/dL (ref 30.0–36.0)
MCV: 89.9 fL (ref 78.0–100.0)
Monocytes Absolute: 0.4 10*3/uL (ref 0.1–1.0)
Monocytes Relative: 5 %
Neutro Abs: 7.1 10*3/uL (ref 1.7–7.7)
Neutrophils Relative %: 90 %
PLATELETS: 223 10*3/uL (ref 150–400)
RBC: 4.45 MIL/uL (ref 4.22–5.81)
RDW: 16 % — ABNORMAL HIGH (ref 11.5–15.5)
WBC: 7.9 10*3/uL (ref 4.0–10.5)

## 2015-09-30 LAB — BLOOD GAS, ARTERIAL
ACID-BASE DEFICIT: 5.4 mmol/L — AB (ref 0.0–2.0)
Bicarbonate: 20 mEq/L (ref 20.0–24.0)
DELIVERY SYSTEMS: POSITIVE
DRAWN BY: 257701
EXPIRATORY PAP: 6
FIO2: 1
Inspiratory PAP: 12
MODE: POSITIVE
O2 SAT: 99.3 %
PCO2 ART: 41.4 mmHg (ref 35.0–45.0)
PH ART: 7.307 — AB (ref 7.350–7.450)
Patient temperature: 99.2
TCO2: 18.3 mmol/L (ref 0–100)
pO2, Arterial: 290 mmHg — ABNORMAL HIGH (ref 80.0–100.0)

## 2015-09-30 LAB — URINALYSIS, ROUTINE W REFLEX MICROSCOPIC
BILIRUBIN URINE: NEGATIVE
Glucose, UA: NEGATIVE mg/dL
Ketones, ur: NEGATIVE mg/dL
Nitrite: NEGATIVE
Protein, ur: 30 mg/dL — AB
Specific Gravity, Urine: 1.022 (ref 1.005–1.030)
Urobilinogen, UA: 0.2 mg/dL (ref 0.0–1.0)
pH: 6 (ref 5.0–8.0)

## 2015-09-30 LAB — URINE MICROSCOPIC-ADD ON

## 2015-09-30 LAB — COMPREHENSIVE METABOLIC PANEL
ALK PHOS: 73 U/L (ref 38–126)
ALT: 7 U/L — AB (ref 17–63)
AST: 18 U/L (ref 15–41)
Albumin: 3.7 g/dL (ref 3.5–5.0)
Anion gap: 8 (ref 5–15)
BILIRUBIN TOTAL: 0.7 mg/dL (ref 0.3–1.2)
BUN: 53 mg/dL — AB (ref 6–20)
CALCIUM: 8.6 mg/dL — AB (ref 8.9–10.3)
CO2: 23 mmol/L (ref 22–32)
CREATININE: 1.82 mg/dL — AB (ref 0.61–1.24)
Chloride: 106 mmol/L (ref 101–111)
GFR calc Af Amer: 39 mL/min — ABNORMAL LOW (ref >60)
GFR, EST NON AFRICAN AMERICAN: 34 mL/min — AB (ref >60)
Glucose, Bld: 226 mg/dL — ABNORMAL HIGH (ref 65–99)
Potassium: 4.5 mmol/L (ref 3.5–5.1)
Sodium: 137 mmol/L (ref 135–145)
Total Protein: 6.1 g/dL — ABNORMAL LOW (ref 6.5–8.1)

## 2015-09-30 LAB — GLUCOSE, CAPILLARY
GLUCOSE-CAPILLARY: 115 mg/dL — AB (ref 65–99)
Glucose-Capillary: 122 mg/dL — ABNORMAL HIGH (ref 65–99)
Glucose-Capillary: 168 mg/dL — ABNORMAL HIGH (ref 65–99)

## 2015-09-30 LAB — I-STAT TROPONIN, ED: Troponin i, poc: 0.03 ng/mL (ref 0.00–0.08)

## 2015-09-30 LAB — INFLUENZA PANEL BY PCR (TYPE A & B)
H1N1 flu by pcr: NOT DETECTED
INFLAPCR: NEGATIVE
INFLBPCR: NEGATIVE

## 2015-09-30 LAB — I-STAT CG4 LACTIC ACID, ED
LACTIC ACID, VENOUS: 1.46 mmol/L (ref 0.5–2.0)
Lactic Acid, Venous: <0.3 mmol/L — ABNORMAL LOW (ref 0.5–2.0)

## 2015-09-30 LAB — POCT I-STAT TROPONIN I: Troponin i, poc: 0.02 ng/mL (ref 0.00–0.08)

## 2015-09-30 LAB — MRSA PCR SCREENING: MRSA BY PCR: POSITIVE — AB

## 2015-09-30 LAB — BRAIN NATRIURETIC PEPTIDE: B Natriuretic Peptide: 85.5 pg/mL (ref 0.0–100.0)

## 2015-09-30 MED ORDER — LAMOTRIGINE 100 MG PO TABS
150.0000 mg | ORAL_TABLET | Freq: Two times a day (BID) | ORAL | Status: DC
  Administered 2015-09-30 – 2015-10-04 (×9): 150 mg via ORAL
  Filled 2015-09-30: qty 2
  Filled 2015-09-30 (×2): qty 1
  Filled 2015-09-30: qty 2
  Filled 2015-09-30 (×4): qty 1
  Filled 2015-09-30: qty 2
  Filled 2015-09-30 (×3): qty 1
  Filled 2015-09-30: qty 2

## 2015-09-30 MED ORDER — TRAZODONE HCL 50 MG PO TABS
100.0000 mg | ORAL_TABLET | Freq: Every day | ORAL | Status: DC
  Administered 2015-09-30 – 2015-10-03 (×4): 100 mg via ORAL
  Filled 2015-09-30 (×4): qty 2

## 2015-09-30 MED ORDER — ACETAMINOPHEN 650 MG RE SUPP: 650.0000 mg | Freq: Four times a day (QID) | RECTAL | Status: DC | PRN

## 2015-09-30 MED ORDER — HEPARIN SODIUM (PORCINE) 5000 UNIT/ML IJ SOLN
5000.0000 [IU] | Freq: Three times a day (TID) | INTRAMUSCULAR | Status: DC
  Administered 2015-09-30 – 2015-10-04 (×13): 5000 [IU] via SUBCUTANEOUS
  Filled 2015-09-30 (×14): qty 1

## 2015-09-30 MED ORDER — VANCOMYCIN HCL 10 G IV SOLR
1500.0000 mg | INTRAVENOUS | Status: AC
  Administered 2015-09-30: 1500 mg via INTRAVENOUS
  Filled 2015-09-30: qty 1500

## 2015-09-30 MED ORDER — PREDNISONE 5 MG PO TABS
5.0000 mg | ORAL_TABLET | Freq: Every day | ORAL | Status: DC
  Administered 2015-09-30 – 2015-10-04 (×5): 5 mg via ORAL
  Filled 2015-09-30 (×5): qty 1

## 2015-09-30 MED ORDER — AMLODIPINE BESYLATE 5 MG PO TABS
5.0000 mg | ORAL_TABLET | Freq: Every day | ORAL | Status: DC
  Administered 2015-09-30 – 2015-10-02 (×3): 5 mg via ORAL
  Filled 2015-09-30 (×3): qty 1

## 2015-09-30 MED ORDER — QUETIAPINE FUMARATE 25 MG PO TABS
75.0000 mg | ORAL_TABLET | Freq: Every day | ORAL | Status: DC
  Administered 2015-09-30 – 2015-10-03 (×4): 75 mg via ORAL
  Filled 2015-09-30: qty 2
  Filled 2015-09-30 (×2): qty 3
  Filled 2015-09-30: qty 2

## 2015-09-30 MED ORDER — VANCOMYCIN HCL IN DEXTROSE 1-5 GM/200ML-% IV SOLN: 1000.0000 mg | INTRAVENOUS | Status: DC

## 2015-09-30 MED ORDER — BISACODYL 10 MG RE SUPP: 10.0000 mg | Freq: Every day | RECTAL | Status: DC | PRN

## 2015-09-30 MED ORDER — ASPIRIN 81 MG PO CHEW
81.0000 mg | CHEWABLE_TABLET | Freq: Every day | ORAL | Status: DC
  Administered 2015-09-30 – 2015-10-04 (×5): 81 mg via ORAL
  Filled 2015-09-30 (×5): qty 1

## 2015-09-30 MED ORDER — ALBUTEROL SULFATE (2.5 MG/3ML) 0.083% IN NEBU
2.5000 mg | INHALATION_SOLUTION | RESPIRATORY_TRACT | Status: DC | PRN
  Administered 2015-10-03 – 2015-10-04 (×2): 2.5 mg via RESPIRATORY_TRACT
  Filled 2015-09-30 (×2): qty 3

## 2015-09-30 MED ORDER — CARBIDOPA-LEVODOPA 25-250 MG PO TABS
1.0000 | ORAL_TABLET | Freq: Three times a day (TID) | ORAL | Status: DC
  Administered 2015-09-30 – 2015-10-04 (×13): 1 via ORAL
  Filled 2015-09-30 (×15): qty 1

## 2015-09-30 MED ORDER — CLONAZEPAM 0.5 MG PO TABS: 0.2500 mg | ORAL_TABLET | Freq: Two times a day (BID) | ORAL | Status: DC

## 2015-09-30 MED ORDER — PIPERACILLIN-TAZOBACTAM 3.375 G IVPB
3.3750 g | Freq: Once | INTRAVENOUS | Status: AC
  Administered 2015-09-30: 3.375 g via INTRAVENOUS
  Filled 2015-09-30: qty 50

## 2015-09-30 MED ORDER — DEXTROSE 5 % IV SOLN
2.0000 g | Freq: Two times a day (BID) | INTRAVENOUS | Status: DC
  Administered 2015-09-30 – 2015-10-01 (×2): 2 g via INTRAVENOUS
  Filled 2015-09-30 (×3): qty 2

## 2015-09-30 MED ORDER — FAMOTIDINE 20 MG PO TABS
20.0000 mg | ORAL_TABLET | Freq: Two times a day (BID) | ORAL | Status: DC
  Administered 2015-09-30 – 2015-10-04 (×9): 20 mg via ORAL
  Filled 2015-09-30 (×9): qty 1

## 2015-09-30 MED ORDER — SODIUM CHLORIDE 0.9 % IV BOLUS (SEPSIS)
1000.0000 mL | Freq: Once | INTRAVENOUS | Status: AC
  Administered 2015-09-30: 1000 mL via INTRAVENOUS

## 2015-09-30 MED ORDER — SODIUM CHLORIDE 0.9 % IJ SOLN
3.0000 mL | Freq: Two times a day (BID) | INTRAMUSCULAR | Status: DC
  Administered 2015-09-30 – 2015-10-04 (×6): 3 mL via INTRAVENOUS

## 2015-09-30 MED ORDER — ACETAMINOPHEN 325 MG PO TABS: 650.0000 mg | ORAL_TABLET | Freq: Four times a day (QID) | ORAL | Status: DC | PRN

## 2015-09-30 MED ORDER — ENTACAPONE 200 MG PO TABS
200.0000 mg | ORAL_TABLET | Freq: Three times a day (TID) | ORAL | Status: DC
  Administered 2015-09-30 – 2015-10-04 (×13): 200 mg via ORAL
  Filled 2015-09-30 (×15): qty 1

## 2015-09-30 MED ORDER — CETYLPYRIDINIUM CHLORIDE 0.05 % MT LIQD
7.0000 mL | Freq: Two times a day (BID) | OROMUCOSAL | Status: DC
  Administered 2015-09-30 – 2015-10-04 (×8): 7 mL via OROMUCOSAL

## 2015-09-30 MED ORDER — INSULIN ASPART 100 UNIT/ML ~~LOC~~ SOLN
0.0000 [IU] | Freq: Three times a day (TID) | SUBCUTANEOUS | Status: DC
  Administered 2015-09-30: 1 [IU] via SUBCUTANEOUS
  Administered 2015-10-01: 2 [IU] via SUBCUTANEOUS
  Administered 2015-10-02: 9 [IU] via SUBCUTANEOUS
  Administered 2015-10-02: 1 [IU] via SUBCUTANEOUS
  Administered 2015-10-03: 3 [IU] via SUBCUTANEOUS
  Administered 2015-10-04: 1 [IU] via SUBCUTANEOUS

## 2015-09-30 MED ORDER — SODIUM CHLORIDE 0.9 % IV SOLN
INTRAVENOUS | Status: DC
  Administered 2015-09-30 – 2015-10-03 (×2): via INTRAVENOUS

## 2015-09-30 MED ORDER — SERTRALINE HCL 100 MG PO TABS
200.0000 mg | ORAL_TABLET | Freq: Every day | ORAL | Status: DC
  Administered 2015-09-30 – 2015-10-04 (×5): 200 mg via ORAL
  Filled 2015-09-30 (×5): qty 2

## 2015-09-30 MED ORDER — ONDANSETRON HCL 4 MG PO TABS: 4.0000 mg | ORAL_TABLET | Freq: Four times a day (QID) | ORAL | Status: DC | PRN

## 2015-09-30 MED ORDER — CLONAZEPAM 0.5 MG PO TABS
0.2500 mg | ORAL_TABLET | Freq: Every day | ORAL | Status: DC
  Administered 2015-10-01 – 2015-10-04 (×4): 0.25 mg via ORAL
  Filled 2015-09-30 (×5): qty 1

## 2015-09-30 MED ORDER — POLYETHYLENE GLYCOL 3350 17 G PO PACK
17.0000 g | PACK | Freq: Every day | ORAL | Status: DC | PRN
  Administered 2015-10-04: 17 g via ORAL
  Filled 2015-09-30: qty 1

## 2015-09-30 MED ORDER — HYDRALAZINE HCL 25 MG PO TABS
25.0000 mg | ORAL_TABLET | Freq: Two times a day (BID) | ORAL | Status: DC
  Administered 2015-09-30 – 2015-10-01 (×3): 25 mg via ORAL
  Filled 2015-09-30 (×3): qty 1

## 2015-09-30 MED ORDER — CLONAZEPAM 0.5 MG PO TABS
0.5000 mg | ORAL_TABLET | Freq: Every day | ORAL | Status: DC
  Administered 2015-09-30 – 2015-10-03 (×4): 0.5 mg via ORAL
  Filled 2015-09-30 (×4): qty 1

## 2015-09-30 MED ORDER — ONDANSETRON HCL 4 MG/2ML IJ SOLN: 4.0000 mg | Freq: Four times a day (QID) | INTRAMUSCULAR | Status: DC | PRN

## 2015-09-30 MED ORDER — LISINOPRIL 10 MG PO TABS: 10.0000 mg | ORAL_TABLET | Freq: Every day | ORAL | Status: DC

## 2015-09-30 NOTE — Progress Notes (Signed)
ANTIBIOTIC CONSULT NOTE - INITIAL  Pharmacy Consult for Vancomycin, renally adjust Ceftazidime Indication: Pneumonia  Allergies  Allergen Reactions  . Cholestatin Other (See Comments)    Per [facility]    Patient Measurements: Height: 6' (182.9 cm) Weight: 162 lb 7.7 oz (73.7 kg) IBW/kg (Calculated) : 77.6  Vital Signs: Temp: 99.2 F (37.3 C) (10/13 0724) Temp Source: Oral (10/13 1008) BP: 131/63 mmHg (10/13 1200) Pulse Rate: 95 (10/13 1215) Intake/Output from previous day:   Intake/Output from this shift: Total I/O In: 1502.5 [I.V.:2.5; IV Piggyback:1500] Out: -   Labs:  Recent Labs  09/30/15 0740  WBC 7.9  HGB 13.0  PLT 223  CREATININE 1.82*   Estimated Creatinine Clearance: 34.9 mL/min (by C-G formula based on Cr of 1.82). No results for input(s): VANCOTROUGH, VANCOPEAK, VANCORANDOM, GENTTROUGH, GENTPEAK, GENTRANDOM, TOBRATROUGH, TOBRAPEAK, TOBRARND, AMIKACINPEAK, AMIKACINTROU, AMIKACIN in the last 72 hours.   Microbiology: Recent Results (from the past 720 hour(s))  MRSA PCR Screening     Status: Abnormal   Collection Time: 09/30/15 11:05 AM  Result Value Ref Range Status   MRSA by PCR POSITIVE (A) NEGATIVE Final    Comment:        The GeneXpert MRSA Assay (FDA approved for NASAL specimens only), is one component of a comprehensive MRSA colonization surveillance program. It is not intended to diagnose MRSA infection nor to guide or monitor treatment for MRSA infections. RESULT CALLED TO, READ BACK BY AND VERIFIED WITH: CAROLYN CREECH,RN [id number] @ 41 BY J SCOTTON     Medical History: Past Medical History  Diagnosis Date  . Hearing aid worn   . Hypertension   . Parkinson disease ([place])   . Pneumonia   . Diabetes mellitus type II   . Arthritis   . Bipolar disorder ([place])   . Anxiety   . Depression   . MRSA infection (methicillin-resistant Staphylococcus aureus)   . Chronic indwelling Foley catheter   . C2 cervical fracture ([name])     due to pt  fall  . SIRS (systemic inflammatory response syndrome) ([name])   . Memory loss   . Benign prostate hyperplasia   . Memory difficulty 08/21/2014  . Neuromuscular disorder ([place])     parkinsons  . Flu   . Falls   . UTI (urinary tract infection)      Assessment: 75 yoM presents to ED on 10/13 with SOB and hypoxia from ALF.  PMH includes Parkinson's, dementia, DM, indwelling foley catheter, and falls.  He was most recently admitted 02/2015 with Klebsiella UTI (culture date 3/25) and Pseudomonas UTI (culture date 3/29).  He was placed on bipap in the ED.  CXR is c/w pneumonia in right mid lower lung zones.  He is now admitted with HCAP, and pharmacy is consulted to dose Vancomycin and renally adjust Ceftazidime.  First doses of vancomycin and Zosyn were given in ED.  10/13 >> Zosyn x1 10/13 >> vanc >> 10/13 >> Ceftazidime >>   Today, 09/30/2015: Tm 99.2 WBC remain WNL SCr 1.82 (baseline near 1-1.4), CrCl ~ 35 ml/min. Lactic acid: 1.46, repeat shows < 0.3  Goal of Therapy:  Vancomycin trough level 15-20 mcg/ml  Plan:  Ceftazidime 2g IV q12h Vancomycin 1.5g IV once, then 1g IV q24h. Measure Vanc trough at steady state. Follow up renal fxn, culture results, and clinical course.  Gretta Arab PharmD, BCPS Pager [phone] 09/30/2015 1:20 PM

## 2015-09-30 NOTE — Progress Notes (Signed)
Pt currently off BIPAP at this time.  

## 2015-09-30 NOTE — Progress Notes (Signed)
RT assisted with transferring PT from [facility] ED to [facility] ICU- on 4 lpm [place]- uneventful. PT does not appear to be in respiratory distress at this time- RT decided to allow PT to remain on 4 lpm [place]- RN at bedside.

## 2015-09-30 NOTE — ED Notes (Addendum)
Per EMS responded for difficulty breathing.  [facility]  Per [facility] staff patient has no history of shortness of breath and does not use oxygen at home.  Patient was 73% on NRB on scene.  Per EMS patient was hypertensive at SBP of 160-states they gave 1 nitroglycerin SL.  Per [facility] states patient was covered in mucus when they arrived on scene and they had to suction him.  Patient arrived in indwelling foley catheter from facility.

## 2015-09-30 NOTE — ED Provider Notes (Signed)
CSN: [id number]     Arrival date & time 09/30/15  0718 History   First MD Initiated Contact with Patient 09/30/15 [phone]     Chief Complaint  Patient presents with  . Shortness of Breath     (Consider location/radiation/quality/duration/timing/severity/associated sxs/prior Treatment) Patient is a 80 y.o. male presenting with shortness of breath.  Shortness of Breath Severity:  Severe Onset quality:  Gradual Timing:  Constant Progression:  Worsening Chronicity:  New Relieved by:  Nothing Worsened by:  Nothing tried Ineffective treatments:  Oxygen and sitting up (suctioning)    Past Medical History  Diagnosis Date  . Hearing aid worn   . Hypertension   . Parkinson disease ([place])   . Pneumonia   . Diabetes mellitus type II   . Arthritis   . Bipolar disorder ([place])   . Anxiety   . Depression   . MRSA infection (methicillin-resistant Staphylococcus aureus)   . Chronic indwelling Foley catheter   . C2 cervical fracture ([name])     due to pt fall  . SIRS (systemic inflammatory response syndrome) ([name])   . Memory loss   . Benign prostate hyperplasia   . Memory difficulty 08/21/2014  . Neuromuscular disorder ([place])     parkinsons  . Flu   . Falls   . UTI (urinary tract infection)    Past Surgical History  Procedure Laterality Date  . Total hip arthroplasty    . Video bronchoscopy  12/16/2012    Procedure: VIDEO BRONCHOSCOPY;  Surgeon: Melrose Nakayama, MD;  Location: [facility];  Service: Thoracic;  Laterality: Right;  . Video assisted thoracoscopy (vats)/empyema  12/16/2012    Procedure: VIDEO ASSISTED THORACOSCOPY (VATS)/EMPYEMA;  Surgeon: Melrose Nakayama, MD;  Location: [place];  Service: Thoracic;  Laterality: Right;   Family History  Problem Relation Age of Onset  . Depression Father   . Cancer Mother   . Heart disease Mother    Social History  Substance Use Topics  . Smoking status: Never Smoker   . Smokeless tobacco: Never Used  . Alcohol Use: No     Comment:  occasional    Review of Systems  Unable to perform ROS: Unstable vital signs  Respiratory: Positive for shortness of breath.       Allergies  Cholestatin  Home Medications   Prior to Admission medications   Medication Sig Start Date End Date Taking? Authorizing Provider  acetaminophen (TYLENOL) 325 MG tablet Take 650 mg by mouth every 6 (six) hours as needed for mild pain.   Yes Historical Provider, MD  amLODipine (NORVASC) 5 MG tablet Take 1 tablet (5 mg total) by mouth daily. 02/03/13  Yes Janece Canterbury, MD  aspirin 81 MG chewable tablet Chew 81 mg by mouth daily.   Yes Historical Provider, MD  bisacodyl (DULCOLAX) 10 MG suppository Place 10 mg rectally daily as needed for moderate constipation.   Yes Historical Provider, MD  carbidopa-levodopa (SINEMET IR) 25-250 MG per tablet Take 1 tablet by mouth 3 (three) times daily.   Yes Historical Provider, MD  clonazePAM (KLONOPIN) 0.5 MG tablet Take 0.25-0.5 mg by mouth 2 (two) times daily. Takes 1/2 tablet at noon and 1 tablet at bedtime   Yes Historical Provider, MD  entacapone (COMTAN) 200 MG tablet Take 200 mg by mouth 3 (three) times daily.   Yes Historical Provider, MD  famotidine (PEPCID) 20 MG tablet Take 20 mg by mouth 2 (two) times daily.   Yes Historical Provider, MD  [place]) [place]  Take 237 mLs by mouth daily.    Yes Historical Provider, MD  hydrALAZINE (APRESOLINE) 25 MG tablet Take 25 mg by mouth 2 (two) times daily.    Yes Historical Provider, MD  lamoTRIgine (LAMICTAL) 150 MG tablet Take 1 tablet (150 mg total) by mouth 2 (two) times daily. 12/02/14  Yes Kathlee Nations, MD  lisinopril (PRINIVIL,ZESTRIL) 10 MG tablet Take 10 mg by mouth daily.   Yes Historical Provider, MD  loratadine (CLARITIN) 10 MG tablet Take 10 mg by mouth daily as needed for allergies.   Yes Historical Provider, MD  Multiple Vitamin (MULTIVITAMIN WITH MINERALS) TABS tablet Take 1 tablet by mouth daily.   Yes Historical Provider, MD   predniSONE (DELTASONE) 2.5 MG tablet Take 2.5 mg by mouth daily with breakfast.   Yes Historical Provider, MD  QUEtiapine (SEROQUEL) 25 MG tablet Take 75 mg by mouth at bedtime.   Yes Historical Provider, MD  sertraline (ZOLOFT) 100 MG tablet Take 2 tablets (200 mg total) by mouth daily. 10/01/14  Yes Kathlee Nations, MD  Skin Protectants, Misc. (DIMETHICONE-ZINC OXIDE) cream Apply topically 2 (two) times daily as needed for dry skin.   Yes Historical Provider, MD  traZODone (DESYREL) 100 MG tablet Take 1 tablet (100 mg total) by mouth at bedtime. 12/02/14  Yes Kathlee Nations, MD  albuterol (PROVENTIL) (2.5 MG/3ML) 0.083% nebulizer solution Take 3 mLs (2.5 mg total) by nebulization every 2 (two) hours as needed for wheezing or shortness of breath. Patient not taking: Reported on 08/25/2015 04/21/14   Hosie Poisson, MD  guaiFENesin (MUCINEX) 600 MG 12 hr tablet Take 1 tablet (600 mg total) by mouth 2 (two) times daily. Patient not taking: Reported on 08/25/2015 04/21/14   Hosie Poisson, MD  ipratropium-albuterol (DUONEB) 0.5-2.5 (3) MG/3ML SOLN Take 3 mLs by nebulization 3 (three) times daily. Patient not taking: Reported on 08/25/2015 04/21/14   Hosie Poisson, MD  memantine [facility] TITRATION PAK) tablet pack 5 mg/day for =1 week; 5 mg twice daily for =1 week; 15 mg/day given in 5 mg and 10 mg separated doses for =1 week; then 10 mg twice daily Patient not taking: Reported on 08/25/2015 05/31/15   Kathrynn Ducking, MD  pantoprazole (PROTONIX) 40 MG tablet Take 1 tablet (40 mg total) by mouth daily at 12 noon. Patient not taking: Reported on 08/25/2015 04/21/14   Hosie Poisson, MD  predniSONE (DELTASONE) 5 MG tablet Take 1 tablet (5 mg total) by mouth daily with breakfast. Patient not taking: Reported on 08/25/2015 03/22/15   Donne Hazel, MD  QUEtiapine (SEROQUEL) 50 MG tablet Take 1 tablet (50 mg total) by mouth at bedtime. Patient not taking: Reported on 09/30/2015 12/02/14   Kathlee Nations, MD   BP 142/64 mmHg  Pulse  86  Temp(Src) 97.8 F (36.6 C) (Oral)  Resp 16  Ht 6' (1.829 m)  Wt 162 lb 7.7 oz (73.7 kg)  BMI 22.03 kg/m2  SpO2 100% Physical Exam  Constitutional: He is oriented to person, place, and time. He appears well-developed and well-nourished. He has a sickly appearance. He appears ill. He appears distressed (tachypnea).  HENT:  Head: Normocephalic and atraumatic.  Eyes: Conjunctivae and EOM are normal.  Neck: Normal range of motion.  Cardiovascular: Normal rate, regular rhythm, normal heart sounds and intact distal pulses.  Exam reveals no gallop and no friction rub.   No murmur heard. Pulmonary/Chest: Effort normal. No respiratory distress. He has no wheezes. He has rhonchi (diffuse). He has  no rales.  Abdominal: Soft. He exhibits no distension. There is no tenderness. There is no guarding.  Genitourinary:  Chronic foley  Musculoskeletal: He exhibits no edema.  Neurological: He is alert and oriented to person, place, and time.  Skin: Skin is warm and dry. He is not diaphoretic.  Nursing note and vitals reviewed.   ED Course  Procedures (including critical care time) Labs Review Labs Reviewed  MRSA PCR SCREENING - Abnormal; Notable for the following:    MRSA by PCR POSITIVE (*)    All other components within normal limits  COMPREHENSIVE METABOLIC PANEL - Abnormal; Notable for the following:    Glucose, Bld 226 (*)    BUN 53 (*)    Creatinine, Ser 1.82 (*)    Calcium 8.6 (*)    Total Protein 6.1 (*)    ALT 7 (*)    GFR calc non Af Amer 34 (*)    GFR calc Af Amer 39 (*)    All other components within normal limits  CBC WITH DIFFERENTIAL/PLATELET - Abnormal; Notable for the following:    RDW 16.0 (*)    Lymphs Abs 0.4 (*)    All other components within normal limits  URINALYSIS, ROUTINE W REFLEX MICROSCOPIC (NOT AT [facility]) - Abnormal; Notable for the following:    APPearance TURBID (*)    Hgb urine dipstick MODERATE (*)    Protein, ur 30 (*)    Leukocytes, UA LARGE (*)     All other components within normal limits  BLOOD GAS, ARTERIAL - Abnormal; Notable for the following:    pH, Arterial 7.307 (*)    pO2, Arterial 290 (*)    Acid-base deficit 5.4 (*)    All other components within normal limits  URINE MICROSCOPIC-ADD ON - Abnormal; Notable for the following:    Bacteria, UA MANY (*)    All other components within normal limits  GLUCOSE, CAPILLARY - Abnormal; Notable for the following:    Glucose-Capillary 122 (*)    All other components within normal limits  GLUCOSE, CAPILLARY - Abnormal; Notable for the following:    Glucose-Capillary 115 (*)    All other components within normal limits  I-STAT CG4 LACTIC ACID, ED - Abnormal; Notable for the following:    Lactic Acid, Venous <0.30 (*)    All other components within normal limits  CULTURE, BLOOD (ROUTINE X 2)  CULTURE, BLOOD (ROUTINE X 2)  URINE CULTURE  CULTURE, EXPECTORATED SPUTUM-ASSESSMENT  GRAM STAIN  BRAIN NATRIURETIC PEPTIDE  INFLUENZA PANEL BY PCR (TYPE A & B, H1N1)  HIV ANTIBODY (ROUTINE TESTING)  LEGIONELLA PNEUMOPHILA SEROGP 1 UR AG  HEMOGLOBIN A1C  CBC  BASIC METABOLIC PANEL  I-STAT CG4 LACTIC ACID, ED  I-STAT TROPOININ, ED  I-STAT TROPOININ, ED  POCT I-STAT TROPONIN I    Imaging Review Dg Chest Port 1 View  09/30/2015  CLINICAL DATA:  Shortness of breath with decreased oxygen saturation EXAM: PORTABLE CHEST 1 VIEW COMPARISON:  March 16, 2015 FINDINGS: There is airspace consolidation throughout the right mid and lower lung zones. Left lung is clear. Heart is upper normal in size with pulmonary vascularity within normal limits. No appreciable adenopathy. There old healed rib fractures on the left. There is marked arthropathy in each shoulder. IMPRESSION: Areas of airspace consolidation in the right mid lower lung zones, most consistent with pneumonia. Lungs elsewhere clear. No change in cardiac silhouette. Followup PA and lateral chest radiographs recommended in 3-4 weeks following  trial of antibiotic therapy to  ensure resolution and exclude underlying malignancy. Electronically Signed   By: Lowella Grip III M.D.   On: 09/30/2015 07:50   I have personally reviewed and evaluated these images and lab results as part of my medical decision-making.   EKG Interpretation None      MDM   Final diagnoses:  HCAP (healthcare-associated pneumonia)  Acute respiratory failure with hypoxia ([place])   80 year old male with a history of Parkinson's, dementia, diabetes, bipolar, anxiety, presents with concern for shortness of breath and hypoxia from his assisted living facility. Per facility and EMS, patient had been not feeling well for the last several days, and today his oxygen saturations were in the 60s on the evaluated him. He was put on nonrebreather at the facility and with EMS and sats were in the 70s, improving to the mid 80s with BiPAP. On arrival to the emergency department, patient was placed on BiPAP at 100% FiO2 with improvement of his oxygen saturation and work of breathing.  Called patient's power of attorney Aizik Reh, who reports that patient has stated he wanted to be DO NOT INTUBATE and DO NOT RESUSCITATE.    Blood cultures obtained, and urine cultures obtained from replaced indwelling foley catheter.  Chest x-ray, history, vital signs concerning for pneumonia. Patient was given vancomycin and Zosyn for concern of healthcare associated pneumonia. BiPAP weaned to 40%.  BP/HR stable.  Will admit to stepdown unit for further care.   CRITICAL CARE: Respiratory Failure with hypoxia Performed by: Alvino Chapel   Total critical care time: 51min  Critical care time was exclusive of separately billable procedures and treating other patients.  Critical care was necessary to treat or prevent imminent or life-threatening deterioration.  Critical care was time spent personally by me on the following activities: development of treatment plan with  patient and/or surrogate as well as nursing, discussions with consultants, evaluation of patient's response to treatment, examination of patient, obtaining history from patient or surrogate, ordering and performing treatments and interventions, ordering and review of laboratory studies, ordering and review of radiographic studies, pulse oximetry and re-evaluation of patient's condition.    Gareth Morgan, MD 09/30/15 [phone]

## 2015-09-30 NOTE — ED Notes (Signed)
MD at bedside. 

## 2015-09-30 NOTE — Progress Notes (Signed)
Pt currently off BIPAP at this time. Pt doing well Rt will continue to monitor.

## 2015-09-30 NOTE — H&P (Signed)
[facility] History and Physical     History and Physical:    [name]   NID:[id number] DOB: 06/20/1935 DOA: 09/30/2015  Referring MD/provider: Dr. Livia Snellen PCP: No primary care provider on file.   Chief Complaint: sob  History of Present Illness:   [name] is an 80 y.o. male past medical history of Parkinson's, dementia diabetes mellitus type 2, bipolar disorder who resides at an assisted living facility who was brought in for hypoxia per EMS report patient was not feeling well for the last several days when they got to the facility he was satting in the 60s was put on a nonrebreather and saturations improved into the 80s, then transition to BiPAP. On arrival to the emergency room he was satting 89% on FiO2 100 his respiration was fast. The patient relates he has been having shortness of breath and coughing but is nonproductive no blood. He does not remember having any fever or cough with eating.  In the ED: Assessment of the acute renal failure, with lactic acid 1.4 a repeat lactic acid was less than 0.3% cardiac enzymes negative, his white blood cell count was 7.9 with no left shift  ROS:   ROS  Constitutional: No fever, no chills;  Appetite normal; No weight loss, no weight gain, no fatigue.   HEENT: No blurry vision, no diplopia, no pharyngitis, no dysphagia  CV: No chest pain, no palpitations, no PND, no orthopnea, no edema.   Resp:  no pleuritic pain.  GI: No nausea, no vomiting, no diarrhea, no melena, no hematochezia, no constipation, no abdominal pain.   GU: No dysuria, no hematuria, no frequency, no urgency.  MSK: No myalgias, no arthralgias.   Neuro:  No headache, no focal neurological deficits, no history of seizures.   Psych: No depression, no anxiety.   Endo: No heat intolerance, no cold intolerance, no polyuria, no polydipsia   Skin: No rashes, no skin lesions.   Heme: No easy bruising.   Travel history: No recent travel.   Past  Medical History:   Past Medical History  Diagnosis Date  . Hearing aid worn   . Hypertension   . Parkinson disease ([place])   . Pneumonia   . Diabetes mellitus type II   . Arthritis   . Bipolar disorder ([place])   . Anxiety   . Depression   . MRSA infection (methicillin-resistant Staphylococcus aureus)   . Chronic indwelling Foley catheter   . C2 cervical fracture ([name])     due to pt fall  . SIRS (systemic inflammatory response syndrome) ([name])   . Memory loss   . Benign prostate hyperplasia   . Memory difficulty 08/21/2014  . Neuromuscular disorder ([place])     parkinsons  . Flu   . Falls   . UTI (urinary tract infection)     Past Surgical History:   Past Surgical History  Procedure Laterality Date  . Total hip arthroplasty    . Video bronchoscopy  12/16/2012    Procedure: VIDEO BRONCHOSCOPY;  Surgeon: Melrose Nakayama, MD;  Location: [place];  Service: Thoracic;  Laterality: Right;  . Video assisted thoracoscopy (vats)/empyema  12/16/2012    Procedure: VIDEO ASSISTED THORACOSCOPY (VATS)/EMPYEMA;  Surgeon: Melrose Nakayama, MD;  Location: [place];  Service: Thoracic;  Laterality: Right;    Social History:   Social History   Social History  . Marital Status: Divorced    Spouse Name: N/A  . Number of Children: 2  . Years of  Education: college 1   Occupational History  . Retired    Social History Main Topics  . Smoking status: Never Smoker   . Smokeless tobacco: Never Used  . Alcohol Use: No     Comment: occasional  . Drug Use: No  . Sexual Activity: Not Currently   Other Topics Concern  . Not on file   Social History Narrative   Patient is right handed.   Patient drinks 1 cup of caffeine daily.    Family history:   Family History  Problem Relation Age of Onset  . Depression Father   . Cancer Mother   . Heart disease Mother     Allergies   Cholestatin  Current Medications:   Prior to Admission medications   Medication Sig Start Date End Date  Taking? Authorizing Provider  acetaminophen (TYLENOL) 325 MG tablet Take 650 mg by mouth every 6 (six) hours as needed for mild pain.   Yes Historical Provider, MD  amLODipine (NORVASC) 5 MG tablet Take 1 tablet (5 mg total) by mouth daily. 02/03/13  Yes Janece Canterbury, MD  aspirin 81 MG chewable tablet Chew 81 mg by mouth daily.   Yes Historical Provider, MD  bisacodyl (DULCOLAX) 10 MG suppository Place 10 mg rectally daily as needed for moderate constipation.   Yes Historical Provider, MD  carbidopa-levodopa (SINEMET IR) 25-250 MG per tablet Take 1 tablet by mouth 3 (three) times daily.   Yes Historical Provider, MD  clonazePAM (KLONOPIN) 0.5 MG tablet Take 0.25-0.5 mg by mouth 2 (two) times daily. Takes 1/2 tablet at noon and 1 tablet at bedtime   Yes Historical Provider, MD  entacapone (COMTAN) 200 MG tablet Take 200 mg by mouth 3 (three) times daily.   Yes Historical Provider, MD  famotidine (PEPCID) 20 MG tablet Take 20 mg by mouth 2 (two) times daily.   Yes Historical Provider, MD  GLUCERNA (GLUCERNA) LIQD Take 237 mLs by mouth daily.    Yes Historical Provider, MD  hydrALAZINE (APRESOLINE) 25 MG tablet Take 25 mg by mouth 2 (two) times daily.    Yes Historical Provider, MD  lamoTRIgine (LAMICTAL) 150 MG tablet Take 1 tablet (150 mg total) by mouth 2 (two) times daily. 12/02/14  Yes Kathlee Nations, MD  lisinopril (PRINIVIL,ZESTRIL) 10 MG tablet Take 10 mg by mouth daily.   Yes Historical Provider, MD  loratadine (CLARITIN) 10 MG tablet Take 10 mg by mouth daily as needed for allergies.   Yes Historical Provider, MD  Multiple Vitamin (MULTIVITAMIN WITH MINERALS) TABS tablet Take 1 tablet by mouth daily.   Yes Historical Provider, MD  predniSONE (DELTASONE) 2.5 MG tablet Take 2.5 mg by mouth daily with breakfast.   Yes Historical Provider, MD  QUEtiapine (SEROQUEL) 25 MG tablet Take 75 mg by mouth at bedtime.   Yes Historical Provider, MD  sertraline (ZOLOFT) 100 MG tablet Take 2 tablets (200 mg  total) by mouth daily. 10/01/14  Yes Kathlee Nations, MD  Skin Protectants, Misc. (DIMETHICONE-ZINC OXIDE) cream Apply topically 2 (two) times daily as needed for dry skin.   Yes Historical Provider, MD  traZODone (DESYREL) 100 MG tablet Take 1 tablet (100 mg total) by mouth at bedtime. 12/02/14  Yes Kathlee Nations, MD  albuterol (PROVENTIL) (2.5 MG/3ML) 0.083% nebulizer solution Take 3 mLs (2.5 mg total) by nebulization every 2 (two) hours as needed for wheezing or shortness of breath. Patient not taking: Reported on 08/25/2015 04/21/14   Hosie Poisson, MD  guaiFENesin [facility])  600 MG 12 hr tablet Take 1 tablet (600 mg total) by mouth 2 (two) times daily. Patient not taking: Reported on 08/25/2015 04/21/14   Hosie Poisson, MD  ipratropium-albuterol (DUONEB) 0.5-2.5 (3) MG/3ML SOLN Take 3 mLs by nebulization 3 (three) times daily. Patient not taking: Reported on 08/25/2015 04/21/14   Hosie Poisson, MD  memantine [facility] TITRATION PAK) tablet pack 5 mg/day for =1 week; 5 mg twice daily for =1 week; 15 mg/day given in 5 mg and 10 mg separated doses for =1 week; then 10 mg twice daily Patient not taking: Reported on 08/25/2015 05/31/15   Kathrynn Ducking, MD  pantoprazole (PROTONIX) 40 MG tablet Take 1 tablet (40 mg total) by mouth daily at 12 noon. Patient not taking: Reported on 08/25/2015 04/21/14   Hosie Poisson, MD  predniSONE (DELTASONE) 5 MG tablet Take 1 tablet (5 mg total) by mouth daily with breakfast. Patient not taking: Reported on 08/25/2015 03/22/15   Donne Hazel, MD  QUEtiapine (SEROQUEL) 50 MG tablet Take 1 tablet (50 mg total) by mouth at bedtime. Patient not taking: Reported on 09/30/2015 12/02/14   Kathlee Nations, MD    Physical Exam:   Filed Vitals:   09/30/15 0830 09/30/15 0900 09/30/15 1008 09/30/15 1104  BP: 119/63 116/68 134/70 161/72  Pulse: 96 96 93 106  Temp:      TempSrc:   Oral   Resp: 31 29 24 31   Height:    6' (1.829 m)  Weight:    73.7 kg (162 lb 7.7 oz)  SpO2: 95% 95% 98% 93%      Physical Exam: Blood pressure 161/72, pulse 106, temperature 99.2 F (37.3 C), temperature source Rectal, resp. rate 31, height 6' (1.829 m), weight 73.7 kg (162 lb 7.7 oz), SpO2 93 %. Gen: No acute distress. Head: Normocephalic, atraumatic. Eyes: PERRL, EOMI, sclerae nonicteric. Mouth: Oropharynx Neck: Supple, no jugular venous distention. Chest: Good air movement with crackles bilaterally CV: He is tachycardic with regular rate and rhythm Abdomen: Soft, nontender, nondistended with normal active bowel sounds. Extremities: Extremities Skin: Warm and dry. Neuro: Alert and oriented times2; cranial nerves II through XII grossly intact. Psych: Mood and affect normal.   Data Review:    Labs: Basic Metabolic Panel:  Recent Labs Lab 09/30/15 0740  NA 137  K 4.5  CL 106  CO2 23  GLUCOSE 226*  BUN 53*  CREATININE 1.82*  CALCIUM 8.6*   Liver Function Tests:  Recent Labs Lab 09/30/15 0740  AST 18  ALT 7*  ALKPHOS 73  BILITOT 0.7  PROT 6.1*  ALBUMIN 3.7   No results for input(s): LIPASE, AMYLASE in the last 168 hours. No results for input(s): AMMONIA in the last 168 hours. CBC:  Recent Labs Lab 09/30/15 0740  WBC 7.9  NEUTROABS 7.1  HGB 13.0  HCT 40.0  MCV 89.9  PLT 223   Cardiac Enzymes: No results for input(s): CKTOTAL, CKMB, CKMBINDEX, TROPONINI in the last 168 hours.  BNP (last 3 results) No results for input(s): PROBNP in the last 8760 hours. CBG: No results for input(s): GLUCAP in the last 168 hours.  Radiographic Studies: Dg Chest Port 1 View  09/30/2015  CLINICAL DATA:  Shortness of breath with decreased oxygen saturation EXAM: PORTABLE CHEST 1 VIEW COMPARISON:  March 16, 2015 FINDINGS: There is airspace consolidation throughout the right mid and lower lung zones. Left lung is clear. Heart is upper normal in size with pulmonary vascularity within normal limits. No appreciable adenopathy.  There old healed rib fractures on the left. There is  marked arthropathy in each shoulder. IMPRESSION: Areas of airspace consolidation in the right mid lower lung zones, most consistent with pneumonia. Lungs elsewhere clear. No change in cardiac silhouette. Followup PA and lateral chest radiographs recommended in 3-4 weeks following trial of antibiotic therapy to ensure resolution and exclude underlying malignancy. Electronically Signed   By: Lowella Grip III M.D.   On: 09/30/2015 07:50   *I have personally reviewed the images above*  EKG: Independently reviewed. Sinus rhythm with atrial enlargement normal axis nonspecific T-wave abnormalities.    Assessment/Plan:   Acute respiratory failure with hypoxia ([name]) due to HCAP (healthcare-associated pneumonia) - I agree with IV antibiotics and admission to stepdown, will change his antibiotic to vancomycin and cefepime. - He is not septic at this time, his lactic acid was elevated was probably due to hypoxia. It is now resolved. We'll continue him on a Ventimask and if needed BiPAP. We'll place him nothing by mouth. - We'll continue to monitor strict I's and O's he has a indwelling Foley catheter give Tylenol for fevers. KVO IV fluids. - Check an influenza PCR. - Sputum cultures and blood cultures were ordered  Bipolar disorder ([place]) Continue current medications to changes were made.  Chronic indwelling Foley catheter - And T-wave indwelling Foley catheter. It was exchanged in the ED.  Chronic diastolic congestive heart failure ([name]) - Trace edema bilaterally in the ED has crackles bilaterally, with KVO IV fluids on appreciate any JVD.  Dementia without behavioral disturbance - Continue current regimen no changes were made to his medication.  Diabetes mellitus, type 2 ([place]) - We'll place him nothing by mouth check an A1c, start on sliding scale insulin.   Malnutrition of moderate degree ([name])  DVT prophylaxis  Code Status: DNR/DNI Family Communication: none Disposition Plan: Home  when stable.  Time spent: 75 min  Charlynne Cousins [facility] Pager [phone]  If 7PM-7AM, please contact night-coverage [email or web address] Password [email or web address] 09/30/2015, 11:28 AM

## 2015-09-30 NOTE — ED Notes (Signed)
Bed: RESB Expected date:  Expected time:  Means of arrival:  Comments: EMS 80 yo male from SNF/respiratory distress

## 2015-10-01 DIAGNOSIS — A4150 Gram-negative sepsis, unspecified: Secondary | ICD-10-CM | POA: Diagnosis present

## 2015-10-01 DIAGNOSIS — G20 Parkinson's disease: Secondary | ICD-10-CM

## 2015-10-01 DIAGNOSIS — N179 Acute kidney failure, unspecified: Secondary | ICD-10-CM | POA: Diagnosis present

## 2015-10-01 DIAGNOSIS — J181 Lobar pneumonia, unspecified organism: Secondary | ICD-10-CM

## 2015-10-01 DIAGNOSIS — J9600 Acute respiratory failure, unspecified whether with hypoxia or hypercapnia: Secondary | ICD-10-CM

## 2015-10-01 DIAGNOSIS — J189 Pneumonia, unspecified organism: Secondary | ICD-10-CM | POA: Diagnosis present

## 2015-10-01 DIAGNOSIS — N178 Other acute kidney failure: Secondary | ICD-10-CM

## 2015-10-01 DIAGNOSIS — L8990 Pressure ulcer of unspecified site, unspecified stage: Secondary | ICD-10-CM

## 2015-10-01 DIAGNOSIS — R6520 Severe sepsis without septic shock: Secondary | ICD-10-CM

## 2015-10-01 LAB — GLUCOSE, CAPILLARY
GLUCOSE-CAPILLARY: 159 mg/dL — AB (ref 65–99)
GLUCOSE-CAPILLARY: 93 mg/dL (ref 65–99)
Glucose-Capillary: 104 mg/dL — ABNORMAL HIGH (ref 65–99)
Glucose-Capillary: 164 mg/dL — ABNORMAL HIGH (ref 65–99)

## 2015-10-01 LAB — URINE CULTURE

## 2015-10-01 LAB — CBC
HEMATOCRIT: 34.8 % — AB (ref 39.0–52.0)
HEMOGLOBIN: 11.4 g/dL — AB (ref 13.0–17.0)
MCH: 29.1 pg (ref 26.0–34.0)
MCHC: 32.8 g/dL (ref 30.0–36.0)
MCV: 88.8 fL (ref 78.0–100.0)
Platelets: 202 10*3/uL (ref 150–400)
RBC: 3.92 MIL/uL — ABNORMAL LOW (ref 4.22–5.81)
RDW: 16 % — ABNORMAL HIGH (ref 11.5–15.5)
WBC: 9.6 10*3/uL (ref 4.0–10.5)

## 2015-10-01 LAB — BASIC METABOLIC PANEL
ANION GAP: 8 (ref 5–15)
BUN: 42 mg/dL — ABNORMAL HIGH (ref 6–20)
CHLORIDE: 110 mmol/L (ref 101–111)
CO2: 23 mmol/L (ref 22–32)
Calcium: 8.8 mg/dL — ABNORMAL LOW (ref 8.9–10.3)
Creatinine, Ser: 1.24 mg/dL (ref 0.61–1.24)
GFR calc non Af Amer: 54 mL/min — ABNORMAL LOW (ref >60)
Glucose, Bld: 108 mg/dL — ABNORMAL HIGH (ref 65–99)
Potassium: 3.9 mmol/L (ref 3.5–5.1)
Sodium: 141 mmol/L (ref 135–145)

## 2015-10-01 LAB — HIV ANTIBODY (ROUTINE TESTING W REFLEX): HIV SCREEN 4TH GENERATION: NONREACTIVE

## 2015-10-01 LAB — HEMOGLOBIN A1C
Hgb A1c MFr Bld: 5.6 % (ref 4.8–5.6)
MEAN PLASMA GLUCOSE: 114 mg/dL

## 2015-10-01 LAB — LEGIONELLA PNEUMOPHILA SEROGP 1 UR AG: L. pneumophila Serogp 1 Ur Ag: NEGATIVE

## 2015-10-01 LAB — STREP PNEUMONIAE URINARY ANTIGEN: STREP PNEUMO URINARY ANTIGEN: NEGATIVE

## 2015-10-01 MED ORDER — ENSURE ENLIVE PO LIQD
237.0000 mL | Freq: Two times a day (BID) | ORAL | Status: DC
  Administered 2015-10-01 – 2015-10-04 (×6): 237 mL via ORAL

## 2015-10-01 MED ORDER — HYDRALAZINE HCL 20 MG/ML IJ SOLN
5.0000 mg | INTRAMUSCULAR | Status: DC | PRN
  Administered 2015-10-01: 5 mg via INTRAVENOUS
  Filled 2015-10-01: qty 1

## 2015-10-01 MED ORDER — HYDRALAZINE HCL 25 MG PO TABS
25.0000 mg | ORAL_TABLET | Freq: Three times a day (TID) | ORAL | Status: DC
  Administered 2015-10-01 – 2015-10-02 (×2): 25 mg via ORAL
  Filled 2015-10-01 (×4): qty 1

## 2015-10-01 MED ORDER — HALOPERIDOL LACTATE 5 MG/ML IJ SOLN
INTRAMUSCULAR | Status: AC
  Administered 2015-10-01: 2 mg via INTRAVENOUS
  Filled 2015-10-01: qty 1

## 2015-10-01 MED ORDER — HALOPERIDOL LACTATE 5 MG/ML IJ SOLN
2.0000 mg | Freq: Four times a day (QID) | INTRAMUSCULAR | Status: DC | PRN
  Administered 2015-10-01 – 2015-10-03 (×4): 2 mg via INTRAVENOUS
  Filled 2015-10-01 (×3): qty 1

## 2015-10-01 MED ORDER — DEXTROSE 5 % IV SOLN
2.0000 g | Freq: Three times a day (TID) | INTRAVENOUS | Status: DC
  Administered 2015-10-01 – 2015-10-03 (×6): 2 g via INTRAVENOUS
  Filled 2015-10-01 (×7): qty 2

## 2015-10-01 MED ORDER — PRO-STAT SUGAR FREE PO LIQD
30.0000 mL | Freq: Every day | ORAL | Status: DC
  Administered 2015-10-04: 30 mL via ORAL
  Filled 2015-10-01 (×4): qty 30

## 2015-10-01 MED ORDER — HYDRALAZINE HCL 10 MG PO TABS: 10.0000 mg | ORAL_TABLET | Freq: Three times a day (TID) | ORAL | Status: DC

## 2015-10-01 MED ORDER — VANCOMYCIN HCL IN DEXTROSE 750-5 MG/150ML-% IV SOLN
750.0000 mg | Freq: Two times a day (BID) | INTRAVENOUS | Status: DC
  Administered 2015-10-01 – 2015-10-03 (×5): 750 mg via INTRAVENOUS
  Filled 2015-10-01 (×6): qty 150

## 2015-10-01 NOTE — Progress Notes (Signed)
Initial Nutrition Assessment  DOCUMENTATION CODES:   Non-severe (moderate) malnutrition in context of chronic illness  INTERVENTION:  - Will order Ensure Enlive BID, each supplement provides 350 kcal and 20 grams of protein - Will order Prostat once/day, this supplement provides 100 kcal and 15 grams of protein - RD will continue to monitor for needs  NUTRITION DIAGNOSIS:   Malnutrition related to chronic illness as evidenced by severe depletion of muscle mass, moderate depletions of muscle mass, moderate depletion of body fat.  GOAL:   Patient will meet greater than or equal to 90% of their needs  MONITOR:   PO intake, Supplement acceptance, Weight trends, Labs, Skin, I & O's  REASON FOR ASSESSMENT:   Consult Assessment of nutrition requirement/status  ASSESSMENT:   80 y.o. male past medical history of Parkinson's, dementia diabetes mellitus type 2, bipolar disorder who resides at an assisted living facility who was brought in for hypoxia per EMS report patient was not feeling well for the last several days when they got to the facility he was satting in the 60s was put on a nonrebreather and saturations improved into the 80s, then transition to BiPAP. On arrival to the emergency room he was satting 89% on FiO2 100 his respiration was fast. The patient relates he has been having shortness of breath and coughing but is nonproductive no blood. He does not remember having any fever or cough with eating.  Pt seen for consult. BMI indicates normal weight status. Pt recently arrived to room and has not yet had a meal although he states he is feeling hungry. Pt states appetite was fair to poor PTA as far as he can recall. Noted pt with hx of dementia. He denies abdominal pain or nausea at this time.  Pt unsure of weight changes PTA. Physical assessment shows severe muscle and moderate fat wasting. Per chart review, pt's weight has been stable (158-165 lbs) since 08/11/14.  Likely not  meeting needs; will order supplements to aid in protein given wounds. Medications reviewed. Labs reviewed; CBGs: 104-168 mg/dL, Ca: 8.8 mg/dL, BUN elevated, GFR: 54.   Diet Order:  Diet heart healthy/carb modified Room service appropriate?: Yes; Fluid consistency:: Thin  Skin:  Wound (see comment) (Stage 2 bilateral buttocks)  Last BM:  PTA  Height:   Ht Readings from Last 1 Encounters:  09/30/15 6' (1.829 m)    Weight:   Wt Readings from Last 1 Encounters:  09/30/15 162 lb 7.7 oz (73.7 kg)    Ideal Body Weight:  80.91 kg (kg)  BMI:  Body mass index is 22.03 kg/(m^2).  Estimated Nutritional Needs:   Kcal:  6578-4696  Protein:  70-80 grams  Fluid:  2-2.2 L/day  EDUCATION NEEDS:   No education needs identified at this time     Jarome Matin, RD, LDN Inpatient Clinical Dietitian Pager # [phone] After hours/weekend pager # [phone]

## 2015-10-01 NOTE — Consult Note (Signed)
WOC wound consult note Reason for Consult:PAtient with partial thickness Stage 2 pressure injuries on the bilateral ischial tuberosities Wound type:Pressure Pressure Ulcer POA: Yes Measurement: Left and right measure 1.5cm round x 0.2cm  Wound DUK:RCVK, moist Drainage (amount, consistency, odor) scant serous Periwound:intact Dressing procedure/placement/frequency: I will provide a pressure redistribution chair pad for OOB use, bilateral pressure redistribution boots for the heels and topical care orders as well as positioning guidance for Nursing. [place] nursing team will not follow, but will remain available to this patient, the nursing and medical teams.  Please re-consult if needed. Thanks, Maudie Flakes, MSN, RN, [place], [place], [place] [phone])

## 2015-10-01 NOTE — Progress Notes (Signed)
Pharmacy - Antibiotics  SCr improved overnight to 1.24, CrCl ~38ml/min. Both blood cultures are growing GNR. MRSA PCR was positive.  Plan: Increase Ceftazidime to 2g IV q8h. Increase Vanc to 750mg  q12h. F/u cultures closely.  Romeo Rabon, PharmD, pager [phone]. 10/01/2015,8:07 AM.

## 2015-10-01 NOTE — Progress Notes (Signed)
I met briefly with Mr. Hartis today.  He denied complaints on entering room. He stated he wanted to be left alone. When asked there is and he can do for him he said, "Leave."  I called and spoke with [name], who is listed as his power of attorney.  I explained role of palliative care, and she reports that she would like to meet with me and Mr. Nagengast in person. Unfortunately, she is out of town until Monday.  If he is still inpatient, will plan for meeting with palliative care and patient's POA on Monday.  I will continue to check in with patient to see if he is more willing to talk in the interim, however nursing reports that their interactions have been similar and I doubt he'll be willing to talk without [name] present.  Please let me know if our team can be of further assistance in the interim.  Micheline Rough, MD [place] Team [phone]

## 2015-10-01 NOTE — Progress Notes (Signed)
Date: October 01, 2015 Chart reviewed for concurrent status and case management needs. Will continue to follow patient for changes and needs: Zakyah Yanes, RN, BSN, CCM   [phone] 

## 2015-10-01 NOTE — Progress Notes (Signed)
Pt hypertensive.  Called and informed Dr. Doyle Askew.  Hydralazine ordered.  Irven [place], RN

## 2015-10-01 NOTE — Progress Notes (Addendum)
Patient ID: [name], male   DOB: 08/03/1935, 80 y.o.   MRN: [id number]  [facility] PROGRESS NOTE  [name] RKY:[id number] DOB: Oct 15, 1935 DOA: 2015/10/13 PCP: No primary care provider on file.   Brief narrative:    80 y.o. male past medical history of Parkinson's, dementia diabetes mellitus type 2, bipolar disorder who resides at an assisted living facility who was brought in for hypoxia per EMS report patient was not feeling well for the last several days when they got to the facility he was satting in the 60s was put on a nonrebreather and saturations improved into the 80s, then transition to BiPAP. On arrival to the emergency room he was satting 89% on FiO2 100 with RR in 30's.  Assessment/Plan:    Acute respiratory failure with hypoxia ([name]) due to HCAP, right mid lower lobes  - pt clinically stable this AM but still confused, continue with Vanc and Fortaz day #2 - I agree with IV antibiotics and admission to stepdown, will change his antibiotic to vancomycin and cefepime. - keep on oxygen via [place] for now and taper to off as possible  - follow up on sputum and blood cultures, urine legionella and strep pneumo requested   Sepsis secondary to right mid lower lobes HCAP and gram neg bacteremia ([place]) - pt met criteria for sepsis on admission with HR < 90 and RR > 22, elevated lactic acid and source suspected to be HCAP - preliminary blood cultures with g-rods, source unclear, follow up on urine cultures as well  - continue ABX as noted above, no plan to narrow down at this time  - follow up on blood and sputum cultures  Acute metabolic encephalopathy ([place]) - secondary to sepsis due to HCAP - still confused - keep in SDU for now and treat underlying problem   Acute renal failure ([place]) - pre renal in etiology - resolved with IVF - BMP in AM  Bipolar disorder ([place]) - Continue current medications to changes were made.  Chronic indwelling Foley catheter -  exchanged in the ED.  Chronic diastolic congestive heart failure ([name]) - Trace edema bilaterally in the ED has crackles bilaterally - stable with no signs of volume overload - monitor weights, strict I/O - weights this AM 73.7 kg   Dementia without behavioral disturbance, Parkinson's disease  - Continue current regimen no changes were made to his medication.  Diabetes mellitus, type 2 ([place]) - A1C pending  - continue with SSI for now until oral intake improves   Malnutrition of moderate degree ([place]) - advance diet as pt able to tolerate   DVT prophylaxis - Heparin SQ  Code Status: DNR Family Communication:  No family at bedside  Disposition Plan: Not ready for discharge   IV access:  Peripheral IV  Procedures and diagnostic studies:    Dg Chest Port 1 View October 13, 2015  Areas of airspace consolidation in the right mid lower lung zones, most consistent with pneumonia. Lungs elsewhere clear. No change in cardiac silhouette. Followup PA and lateral chest radiographs recommended in 3-4 weeks following trial of antibiotic therapy to ensure resolution and exclude underlying malignancy.  Medical Consultants:  None  Other Consultants:  None  IAnti-Infectives:   Vancomycin October 13, 2023 --> Tressie Ellis 2023-10-13 -->  Faye Ramsay, MD  [facility] Pager [phone]  If 7PM-7AM, please contact night-coverage [email or web address] Password [email or web address] 10/01/2015, 11:47 AM   LOS: 1 day   HPI/Subjective: No events overnight. Still confused this AM.   Objective: Filed Vitals:  10/01/15 0400 10/01/15 0700 10/01/15 0800 10/01/15 0900  BP: 141/64 108/65 100/50 148/68  Pulse: 91 76 72 78  Temp: 98.7 F (37.1 C)  98.3 F (36.8 C)   TempSrc: Axillary  Oral   Resp: _0 Height:      Weight:      SpO2: 98% 96% 91% 95%    Intake/Output Summary (Last 24 hours) at 10/01/15 1147 Last data filed at 10/01/15 0843  Gross per 24 hour  Intake  262.5 ml  Output   1475 ml  Net -1212.5 ml     Exam:   General:  Pt is somnolent, easy to awake but confused   Cardiovascular: Regular rate and rhythm, o rubs, no gallops  Respiratory: Clear to auscultation bilaterally, crackles at bases with scattered rhonchi   Abdomen: Soft, non tender, non distended, bowel sounds present, no guarding  Data Reviewed: Basic Metabolic Panel:  Recent Labs Lab 09/30/15 0740 10/01/15 0540  NA 137 141  K 4.5 3.9  CL 106 110  CO2 23 23  GLUCOSE 226* 108*  BUN 53* 42*  CREATININE 1.82* 1.24  CALCIUM 8.6* 8.8*   Liver Function Tests:  Recent Labs Lab 09/30/15 0740  AST 18  ALT 7*  ALKPHOS 73  BILITOT 0.7  PROT 6.1*  ALBUMIN 3.7   CBC:  Recent Labs Lab 09/30/15 0740 10/01/15 0540  WBC 7.9 9.6  NEUTROABS 7.1  --   HGB 13.0 11.4*  HCT 40.0 34.8*  MCV 89.9 88.8  PLT 223 202   CBG:  Recent Labs Lab 09/30/15 1210 09/30/15 1617 09/30/15 2225 10/01/15 0812  GLUCAP 122* 115* 168* 104*    Recent Results (from the past 240 hour(s))  Blood Culture (routine x 2)     Status: None (Preliminary result)   Collection Time: 09/30/15  7:40 AM  Result Value Ref Range Status   Specimen Description BLOOD RIGHT ANTECUBITAL  Final   Special Requests BOTTLES DRAWN AEROBIC AND ANAEROBIC 5ML  Final   Culture  Setup Time   Final    GRAM NEGATIVE RODS IN BOTH AEROBIC AND ANAEROBIC BOTTLES CRITICAL RESULT CALLED TO, READ BACK BY AND VERIFIED WITH: B DAVIS RN 2229 09/30/15 A BROWNING Performed at [facility]    Culture PENDING  Incomplete   Report Status PENDING  Incomplete  Blood Culture (routine x 2)     Status: None (Preliminary result)   Collection Time: 09/30/15  7:45 AM  Result Value Ref Range Status   Specimen Description BLOOD LEFT ANTECUBITAL  Final   Special Requests BOTTLES DRAWN AEROBIC AND ANAEROBIC 5CC  Final   Culture  Setup Time   Final    GRAM NEGATIVE RODS ANAEROBIC BOTTLE ONLY CRITICAL RESULT CALLED TO, READ BACK BY AND VERIFIED WITH: B DAVIS RN [id number]  09/30/15 A BROWNING Performed at [facility]    Culture PENDING  Incomplete   Report Status PENDING  Incomplete  Urine culture     Status: None   Collection Time: 09/30/15 10:05 AM  Result Value Ref Range Status   Specimen Description URINE, CATHETERIZED  Final   Special Requests NONE  Final   Culture   Final    MULTIPLE SPECIES PRESENT, SUGGEST RECOLLECTION Performed at [facility]    Report Status 10/01/2015 FINAL  Final  MRSA PCR Screening     Status: Abnormal   Collection Time: 09/30/15 11:05 AM  Result Value Ref Range Status   MRSA by PCR POSITIVE (A)  NEGATIVE Final    Comment:        The GeneXpert MRSA Assay (FDA approved for NASAL specimens only), is one component of a comprehensive MRSA colonization surveillance program. It is not intended to diagnose MRSA infection nor to guide or monitor treatment for MRSA infections. RESULT CALLED TO, READ BACK BY AND VERIFIED WITH: CAROLYN CREECH,RN [id number] @ 8483 BY J SCOTTON      Scheduled Meds: . amLODipine  5 mg Oral Daily  . antiseptic oral rinse  7 mL Mouth Rinse BID  . aspirin  81 mg Oral Daily  . carbidopa-levodopa  1 tablet Oral TID  . cefTAZidime (FORTAZ)  IV  2 g Intravenous Q8H  . clonazePAM  0.25 mg Oral Q1200  . clonazePAM  0.5 mg Oral QHS  . entacapone  200 mg Oral TID  . famotidine  20 mg Oral BID  . heparin  5,000 Units Subcutaneous 3 times per day  . hydrALAZINE  25 mg Oral BID  . insulin aspart  0-9 Units Subcutaneous TID WC  . lamoTRIgine  150 mg Oral BID  . predniSONE  5 mg Oral Q breakfast  . QUEtiapine  75 mg Oral QHS  . sertraline  200 mg Oral Daily  . sodium chloride  3 mL Intravenous Q12H  . traZODone  100 mg Oral QHS  . vancomycin  750 mg Intravenous Q12H   Continuous Infusions: . sodium chloride 10 mL/hr at 09/30/15 1145

## 2015-10-02 DIAGNOSIS — E1100 Type 2 diabetes mellitus with hyperosmolarity without nonketotic hyperglycemic-hyperosmolar coma (NKHHC): Secondary | ICD-10-CM

## 2015-10-02 DIAGNOSIS — J189 Pneumonia, unspecified organism: Secondary | ICD-10-CM | POA: Insufficient documentation

## 2015-10-02 LAB — BASIC METABOLIC PANEL
Anion gap: 9 (ref 5–15)
BUN: 30 mg/dL — AB (ref 6–20)
CO2: 23 mmol/L (ref 22–32)
Calcium: 8.4 mg/dL — ABNORMAL LOW (ref 8.9–10.3)
Chloride: 107 mmol/L (ref 101–111)
Creatinine, Ser: 1.12 mg/dL (ref 0.61–1.24)
GFR calc Af Amer: >60 mL/min (ref >60)
Glucose, Bld: 133 mg/dL — ABNORMAL HIGH (ref 65–99)
POTASSIUM: 3.7 mmol/L (ref 3.5–5.1)
SODIUM: 139 mmol/L (ref 135–145)

## 2015-10-02 LAB — CULTURE, BLOOD (ROUTINE X 2)

## 2015-10-02 LAB — GLUCOSE, CAPILLARY
GLUCOSE-CAPILLARY: 129 mg/dL — AB (ref 65–99)
GLUCOSE-CAPILLARY: 357 mg/dL — AB (ref 65–99)
GLUCOSE-CAPILLARY: 124 mg/dL — AB (ref 65–99)
Glucose-Capillary: 146 mg/dL — ABNORMAL HIGH (ref 65–99)
Glucose-Capillary: 105 mg/dL — ABNORMAL HIGH (ref 65–99)

## 2015-10-02 LAB — CBC
HCT: 31.8 % — ABNORMAL LOW (ref 39.0–52.0)
Hemoglobin: 10.3 g/dL — ABNORMAL LOW (ref 13.0–17.0)
MCH: 28.6 pg (ref 26.0–34.0)
MCHC: 32.4 g/dL (ref 30.0–36.0)
MCV: 88.3 fL (ref 78.0–100.0)
PLATELETS: 187 10*3/uL (ref 150–400)
RBC: 3.6 MIL/uL — AB (ref 4.22–5.81)
RDW: 15.8 % — ABNORMAL HIGH (ref 11.5–15.5)
WBC: 8 10*3/uL (ref 4.0–10.5)

## 2015-10-02 MED ORDER — RESOURCE THICKENUP CLEAR PO POWD
ORAL | Status: DC | PRN
Start: 2015-10-02 — End: 2015-10-04
  Filled 2015-10-02 (×2): qty 125

## 2015-10-02 NOTE — Evaluation (Signed)
Clinical/Bedside Swallow Evaluation Patient Details  Name: [name] MRN: [id number] Date of Birth: 07-10-1935  Today's Date: 10/02/2015 Time: SLP Start Time (ACUTE ONLY): 46 SLP Stop Time (ACUTE ONLY): 1530 SLP Time Calculation (min) (ACUTE ONLY): 20 min  Past Medical History:  Past Medical History  Diagnosis Date  . Hearing aid worn   . Hypertension   . Parkinson disease ([place])   . Pneumonia   . Diabetes mellitus type II   . Arthritis   . Bipolar disorder ([place])   . Anxiety   . Depression   . MRSA infection (methicillin-resistant Staphylococcus aureus)   . Chronic indwelling Foley catheter   . C2 cervical fracture ([name])     due to pt fall  . SIRS (systemic inflammatory response syndrome) ([name])   . Memory loss   . Benign prostate hyperplasia   . Memory difficulty 08/21/2014  . Neuromuscular disorder ([place])     parkinsons  . Flu   . Falls   . UTI (urinary tract infection)    Past Surgical History:  Past Surgical History  Procedure Laterality Date  . Total hip arthroplasty    . Video bronchoscopy  12/16/2012    Procedure: VIDEO BRONCHOSCOPY;  Surgeon: Melrose Nakayama, MD;  Location: [facility];  Service: Thoracic;  Laterality: Right;  . Video assisted thoracoscopy (vats)/empyema  12/16/2012    Procedure: VIDEO ASSISTED THORACOSCOPY (VATS)/EMPYEMA;  Surgeon: Melrose Nakayama, MD;  Location: [facility];  Service: Thoracic;  Laterality: Right;   HPI:  80 y.o. male with past medical history of Parkinson's, dementia, diabetes mellitus type 2, bipolar disorder who resides at an assisted living facility, admitted with acute respiratory failure with hypoxia, sepsis, encephalopathy.  Pt with hx of dysphagia- has had multiple swallowing assessments over the last several years with fluctuating status but hx of aspiration. Last MBS was in May of  2015 with mild deficits noted.  RN reports coughing associated with all PO intake today, therefore swallow evaluation was ordered.      Assessment / Plan / Recommendation Clinical Impression  Pt participated in clinical swallow evaluation, but denied hx of deficits nor any current difficulty with swallowing.  Despite his assertions, he presented with persistent coughing, wet voice thoughout assessment, concerning for aspiration.  Thin liquids elicited immediate, consistent coughing; nectar-thick liquids and purees produced fewer s/s of aspiration,but signs were intermittently present.  When suggested to pt a trial of a modifed diet to improve safety/airway protection, he adamantly opposed.  Pt with clear cognitive deficits, poor insight into condition.  Per notes, Palliative Medicine is involved and discussion with POA is planned for Monday, 10/17.  Pending meeting, pt may benefit from downdgrading diet to  dysphagia 1, honey thick liquids to minimize, but not prevent, aspiration.  However, he will likely refuse to eat a modified diet.  Recommend trial dysphagia1/honey; hold tray if coughing persists; SLP will follow for [place] and determine if there would be value in pursuing instrumental swallow study.  D/W RN.      Aspiration Risk  Moderate    Diet Recommendation Dysphagia 1 (Puree);Honey   Medication Administration: Whole meds with puree Compensations: Small sips/bites    Other  Recommendations Oral Care Recommendations: Oral care BID Other Recommendations: Order thickener from pharmacy   Follow Up Recommendations       Frequency and Duration min 3x week  2 weeks       SLP Swallow Goals     Swallow Study Prior Functional Status  General Other Pertinent Information: 80 y.o. male with past medical history of Parkinson's, dementia, diabetes mellitus type 2, bipolar disorder who resides at an assisted living facility, admitted with acute respiratory failure with hypoxia, sepsis, encephalopathy.  Pt with hx of dysphagia- has had multiple swallowing assessments over the last several years with fluctuating status but  hx of aspiration. Last MBS was in May of  2015 with mild deficits noted.  RN reports coughing associated with all PO intake today, therefore swallow evaluation was ordered.   Type of Study: Bedside swallow evaluation Previous Swallow Assessment: MBS 5/15 Diet Prior to this Study: Regular;Thin liquids Temperature Spikes Noted: No Respiratory Status: Room air History of Recent Intubation: No Behavior/Cognition: Alert;Cooperative;Confused Oral Cavity - Dentition: Adequate natural dentition/normal for age Self-Feeding Abilities: Able to feed self Patient Positioning: Upright in bed Baseline Vocal Quality: Normal Volitional Cough: Strong Volitional Swallow: Able to elicit    Oral/Motor/Sensory Function Overall Oral Motor/Sensory Function: Appears within functional limits for tasks assessed   Ice Chips Ice chips: Not tested   Thin Liquid Thin Liquid: Impaired Presentation: Cup;Self Fed Oral Phase Functional Implications: Right anterior spillage Pharyngeal  Phase Impairments: Suspected delayed Swallow;Multiple swallows;Wet Vocal Quality;Cough - Immediate    Nectar Thick Nectar Thick Liquid: Impaired Presentation: Cup Pharyngeal Phase Impairments: Suspected delayed Swallow;Multiple swallows (intermittent cough, but occurred with less frequency than with thin liquids)   Honey Thick Honey Thick Liquid: Not tested   Puree Puree: Impaired Presentation: Spoon Pharyngeal Phase Impairments: Suspected delayed Swallow;Multiple swallows ; occasional cough  Solid  [name], [place] CCC/SLP Pager [phone]     Solid: Not tested       [name] 10/02/2015,3:49 PM

## 2015-10-02 NOTE — Progress Notes (Signed)
PT Cancellation Note  Patient Details Name: [name] MRN: [id number] DOB: [birth year]   Cancelled Treatment:    Reason Eval/Treat Not Completed: Medical issues which prohibited therapy; Pt with low BP after hypertensive episodes; noted Palliative Care meeting planned for Monday; pt has been asking staff to leave him alone; will check back Monday or as schedule allows   [facility] 10/02/2015, 11:24 AM

## 2015-10-02 NOTE — Clinical Social Work Note (Signed)
Clinical Social Work Assessment  Patient Details  Name: [name] MRN: [id number] Date of Birth: 14-Jul-1935  Date of referral:  10/02/15               Reason for consult:  Discharge Planning, Facility Placement                Permission sought to share information with:  Family Supports, Chartered certified accountant granted to share information::  Yes, Verbal Permission Granted  Name::     [name] HPOA  Agency::  [facility], possibly SNFs if this need is indicated, but [name] states the patient will likely refuse SNF.  Relationship::  [place] Information:  [phone]  Housing/Transportation Living arrangements for the past 2 months:  [place] Chief Executive Officer) Source of Information:  Power of Attorney Patient Interpreter Needed:  None Criminal Activity/Legal Involvement Pertinent to Current Situation/Hospitalization:  No - Comment as needed Significant Relationships:  Other Family Members, Friend Lives with:  Facility Resident Do you feel safe going back to the place where you live?  Yes Need for family participation in patient care:  Yes (Comment)  Care giving concerns:  Patient's HPOA [name] does not report any concerns at this time.    Social Worker assessment / plan:  CSW spoke with patient's HPOA [name] to complete assessment. Per [name], the patient is a long term resident of [facility]. She estimates that the patient has been at the facility for about 2 years. She shares that she is happy with the care and supervision the patient has at the facility. [name] shares that the patient goes by "[name]" (?spelling). CSW explained reason for contacting [name] and explained role. [name] would be agreeable to SNF placement if needed but states the patient usually refuses. She shares the [facility] is usually able to take the patient back so SNF may not be needed. [name] did have questions about what "palliative care"  meant and why she was being contact by our palliative care team. CSW explained that palliative care is focused on symptom management and addressing any symptoms to ensure a patient is comfortable and free of pain. CSW explained that Dr. Domingo Cocking can explain more thoroughly, but that the team wanted to offer their service. [name] is not opposed to this but she seemed to just need clarification. CSW explained that CSW would continue to follow the patient for support and any DC related needs. CSW will update unit CSW via handoff.    Employment status:  Retired Forensic scientist:  Medicare PT Recommendations:  Not assessed at this time [place] / Referral to community resources:  Other (Comment Required) (Will make appropriate referrals when needed)  Patient/Family's Response to care:  Patient's family/HPOA appears to be happy with the care the patient is receiving here at [facility].  Patient/Family's Understanding of and Emotional Response to Diagnosis, Current Treatment, and Prognosis:  [name] has a good understanding of the reason for the patient's admission and the patient's post DC needs.  Emotional Assessment Appearance:  Appears stated age Attitude/Demeanor/Rapport:  Unable to Assess (Patient currently on Bi-Pap, not appropriate to assess) Affect (typically observed):  Other (Patient currently on Bi-PAP) Orientation:  Oriented to Self, Oriented to Place, Oriented to Situation Alcohol / Substance use:  Never Used Psych involvement (Current and /or in the community):  No (Comment)  Discharge Needs  Concerns to be addressed:  Discharge Planning Concerns Readmission within the last 30 days:  No Current discharge risk:  Chronically ill, Physical Impairment, Cognitively Impaired Barriers to Discharge:  Continued Medical Work up   [employer] MSW, [place], [place], [phone]

## 2015-10-02 NOTE — Progress Notes (Signed)
Patient wearing oxygen set at lpm with Sp02=99%. Patient appears to be in no respiratory distress at this time. Bipap not needed at this time,will continue to monitor patient.

## 2015-10-02 NOTE — Progress Notes (Signed)
Pt currently not on BIPAP.Pt doing well at this time.

## 2015-10-02 NOTE — Progress Notes (Signed)
Patient ID: [name], male   DOB: [birth year], 80 y.o.   MRN: [id number]  [facility] PROGRESS NOTE  Dardan Shelton Eldred DZH:[id number] DOB: 1935-08-08 DOA: Oct 18, 2015 PCP: No primary care provider on file.   Brief narrative:    80 y.o. male past medical history of Parkinson's, dementia diabetes mellitus type 2, bipolar disorder who resides at an assisted living facility who was brought in for hypoxia per EMS report patient was not feeling well for the last several days when they got to the facility he was satting in the 60s was put on a nonrebreather and saturations improved into the 80s, then transition to BiPAP. On arrival to the emergency room he was satting 89% on FiO2 100 with RR in 30's.  Assessment/Plan:    Acute respiratory failure with hypoxia ([name]) due to HCAP, right mid lower lobes  - pt clinically stable this AM but still confused, continue with Vanc and Fortaz day #4 - could not examine lungs as pt has refused  - keep on oxygen via [place] for now  - follow up on sputum and blood cultures, urine legionella and strep pneumo requested   Sepsis secondary to right mid lower lobes HCAP and Proteus bacteremia ([place]) - pt met criteria for sepsis on admission, HR < 90 and RR > 22, elevated lactic acid and source suspected to be HCAP, Proteus bacteremia - preliminary blood cultures with Proteus but source is unclear, ? UTI - continue ABX as noted above, no plan to narrow down at this time  - follow up on blood and sputum cultures - as SBP is still low will hold Norvasc and Hydralazine   Acute metabolic encephalopathy ([name]) - secondary to sepsis due to HCAP - still confused - stable for transfer to floor   Acute renal failure ([place]) - pre renal in etiology - resolved with IVF - BMP in AM  Bipolar disorder ([place]) - Continue current medications to changes were made.  Chronic indwelling Foley catheter - exchanged in the ED.  Chronic diastolic congestive heart failure  ([name]) - Trace edema bilaterally in the ED - unable to exam as pt has refused  - monitor weights, strict I/O - weights this AM 73.7 kg   Dementia without behavioral disturbance, Parkinson's disease  - Continue current regimen no changes were made to his medication.  Diabetes mellitus, type 2 ([place]) - A1C pending  - continue with SSI for now until oral intake improves   Malnutrition of moderate degree ([place]) - advance diet as pt able to tolerate  - appreciate nutritionist following   DVT prophylaxis - Heparin SQ  Code Status: DNR Family Communication:  No family at bedside  Disposition Plan: Not ready for discharge   IV access:  Peripheral IV  Procedures and diagnostic studies:    Dg Chest Port 1 View 18-Oct-2015  Areas of airspace consolidation in the right mid lower lung zones, most consistent with pneumonia. Lungs elsewhere clear. No change in cardiac silhouette. Followup PA and lateral chest radiographs recommended in 3-4 weeks following trial of antibiotic therapy to ensure resolution and exclude underlying malignancy.  Medical Consultants:  PCT  Other Consultants:  PT  IAnti-Infectives:   Vancomycin October 18, 2023 --> Tressie Ellis 18-Oct-2023 -->  Faye Ramsay, MD  [facility] Pager [phone]  If 7PM-7AM, please contact night-coverage [email or web address] Password [email or web address] 10/02/2015, 7:38 AM   LOS: 2 days   HPI/Subjective: No events overnight. Still confused this AM.   Objective: Filed Vitals:   10/02/15 0200 10/02/15 0400 10/02/15  0600 10/02/15 0619  BP: 93/57 116/65 109/61 109/61  Pulse: 82 88 80   Temp:      TempSrc:      Resp: '26 23 21   ' Height:      Weight:      SpO2: 96% 98% 99%     Intake/Output Summary (Last 24 hours) at 10/02/15 0738 Last data filed at 10/02/15 0600  Gross per 24 hour  Intake    200 ml  Output   1425 ml  Net  -1225 ml    Exam:   General:  Pt is sleeping, refused exam this AM, asked to leave the room   Data Reviewed: Basic Metabolic  Panel:  Recent Labs Lab 09/30/15 0740 10/01/15 0540 10/02/15 0543  NA 137 141 139  K 4.5 3.9 3.7  CL 106 110 107  CO2 '23 23 23  ' GLUCOSE 226* 108* 133*  BUN 53* 42* 30*  CREATININE 1.82* 1.24 1.12  CALCIUM 8.6* 8.8* 8.4*   Liver Function Tests:  Recent Labs Lab 09/30/15 0740  AST 18  ALT 7*  ALKPHOS 73  BILITOT 0.7  PROT 6.1*  ALBUMIN 3.7   CBC:  Recent Labs Lab 09/30/15 0740 10/01/15 0540 10/02/15 0543  WBC 7.9 9.6 8.0  NEUTROABS 7.1  --   --   HGB 13.0 11.4* 10.3*  HCT 40.0 34.8* 31.8*  MCV 89.9 88.8 88.3  PLT 223 202 187   CBG:  Recent Labs Lab 09/30/15 2225 10/01/15 0812 10/01/15 1115 10/01/15 1605 10/01/15 2116  GLUCAP 168* 104* 159* 93 164*    Recent Results (from the past 240 hour(s))  Blood Culture (routine x 2)     Status: None (Preliminary result)   Collection Time: 09/30/15  7:40 AM  Result Value Ref Range Status   Specimen Description BLOOD RIGHT ANTECUBITAL  Final   Special Requests BOTTLES DRAWN AEROBIC AND ANAEROBIC 5ML  Final   Culture  Setup Time   Final    GRAM NEGATIVE RODS IN BOTH AEROBIC AND ANAEROBIC BOTTLES CRITICAL RESULT CALLED TO, READ BACK BY AND VERIFIED WITH: B DAVIS RN 2229 09/30/15 A BROWNING    Culture   Final    GRAM NEGATIVE RODS Performed at [facility]    Report Status PENDING  Incomplete  Blood Culture (routine x 2)     Status: None (Preliminary result)   Collection Time: 09/30/15  7:45 AM  Result Value Ref Range Status   Specimen Description BLOOD LEFT ANTECUBITAL  Final   Special Requests BOTTLES DRAWN AEROBIC AND ANAEROBIC 5CC  Final   Culture  Setup Time   Final    GRAM NEGATIVE RODS ANAEROBIC BOTTLE ONLY CRITICAL RESULT CALLED TO, READ BACK BY AND VERIFIED WITH: B DAVIS RN 2309 09/30/15 A BROWNING    Culture   Final    GRAM NEGATIVE RODS Performed at [facility]    Report Status PENDING  Incomplete  Urine culture     Status: None   Collection Time: 09/30/15 10:05 AM  Result  Value Ref Range Status   Specimen Description URINE, CATHETERIZED  Final   Special Requests NONE  Final   Culture   Final    MULTIPLE SPECIES PRESENT, SUGGEST RECOLLECTION Performed at [facility]    Report Status 10/01/2015 FINAL  Final  MRSA PCR Screening     Status: Abnormal   Collection Time: 09/30/15 11:05 AM  Result Value Ref Range Status   MRSA by PCR POSITIVE (A) NEGATIVE Final  Comment:        The GeneXpert MRSA Assay (FDA approved for NASAL specimens only), is one component of a comprehensive MRSA colonization surveillance program. It is not intended to diagnose MRSA infection nor to guide or monitor treatment for MRSA infections. RESULT CALLED TO, READ BACK BY AND VERIFIED WITH: CAROLYN CREECH,RN [id number] @ 0981 BY J SCOTTON      Scheduled Meds: . amLODipine  5 mg Oral Daily  . antiseptic oral rinse  7 mL Mouth Rinse BID  . aspirin  81 mg Oral Daily  . carbidopa-levodopa  1 tablet Oral TID  . cefTAZidime (FORTAZ)  IV  2 g Intravenous Q8H  . clonazePAM  0.25 mg Oral Q1200  . clonazePAM  0.5 mg Oral QHS  . entacapone  200 mg Oral TID  . famotidine  20 mg Oral BID  . feeding supplement (ENSURE ENLIVE)  237 mL Oral BID BM  . feeding supplement (PRO-STAT SUGAR FREE 64)  30 mL Oral Daily  . heparin  5,000 Units Subcutaneous 3 times per day  . hydrALAZINE  25 mg Oral 3 times per day  . insulin aspart  0-9 Units Subcutaneous TID WC  . lamoTRIgine  150 mg Oral BID  . predniSONE  5 mg Oral Q breakfast  . QUEtiapine  75 mg Oral QHS  . sertraline  200 mg Oral Daily  . sodium chloride  3 mL Intravenous Q12H  . traZODone  100 mg Oral QHS  . vancomycin  750 mg Intravenous Q12H   Continuous Infusions: . sodium chloride 10 mL/hr at 09/30/15 1145

## 2015-10-03 DIAGNOSIS — I10 Essential (primary) hypertension: Secondary | ICD-10-CM

## 2015-10-03 LAB — BASIC METABOLIC PANEL WITH GFR
Anion gap: 8 (ref 5–15)
BUN: 29 mg/dL — ABNORMAL HIGH (ref 6–20)
CO2: 25 mmol/L (ref 22–32)
Calcium: 8.7 mg/dL — ABNORMAL LOW (ref 8.9–10.3)
Chloride: 106 mmol/L (ref 101–111)
Creatinine, Ser: 1.17 mg/dL (ref 0.61–1.24)
GFR calc Af Amer: >60 mL/min
GFR calc non Af Amer: 58 mL/min — ABNORMAL LOW
Glucose, Bld: 126 mg/dL — ABNORMAL HIGH (ref 65–99)
Potassium: 3.8 mmol/L (ref 3.5–5.1)
Sodium: 139 mmol/L (ref 135–145)

## 2015-10-03 LAB — GLUCOSE, CAPILLARY
Glucose-Capillary: 103 mg/dL — ABNORMAL HIGH (ref 65–99)
Glucose-Capillary: 229 mg/dL — ABNORMAL HIGH (ref 65–99)
Glucose-Capillary: 123 mg/dL — ABNORMAL HIGH (ref 65–99)
Glucose-Capillary: 105 mg/dL — ABNORMAL HIGH (ref 65–99)

## 2015-10-03 LAB — URINE CULTURE: CULTURE: NO GROWTH

## 2015-10-03 LAB — CBC
HEMATOCRIT: 37 % — AB (ref 39.0–52.0)
HEMOGLOBIN: 12 g/dL — AB (ref 13.0–17.0)
MCH: 29.1 pg (ref 26.0–34.0)
MCHC: 32.4 g/dL (ref 30.0–36.0)
MCV: 89.6 fL (ref 78.0–100.0)
Platelets: 209 10*3/uL (ref 150–400)
RBC: 4.13 MIL/uL — AB (ref 4.22–5.81)
RDW: 15.8 % — ABNORMAL HIGH (ref 11.5–15.5)
WBC: 8 10*3/uL (ref 4.0–10.5)

## 2015-10-03 MED ORDER — AMOXICILLIN-POT CLAVULANATE 500-125 MG PO TABS
1.0000 | ORAL_TABLET | Freq: Two times a day (BID) | ORAL | Status: DC
  Administered 2015-10-03 – 2015-10-04 (×3): 500 mg via ORAL
  Filled 2015-10-03 (×4): qty 1

## 2015-10-03 NOTE — Plan of Care (Signed)
Problem: ICU Phase Progression Outcomes Goal: Voiding-avoid urinary catheter unless indicated Outcome: Not Met (add Reason) Chronic Cath Use

## 2015-10-03 NOTE — Progress Notes (Addendum)
Patient ID: [name], male   DOB: 01/25/1935, 80 y.o.   MRN: [id number]  [facility] PROGRESS NOTE  [name] NWG:[id number] DOB: 1935/03/09 DOA: Oct 09, 2015 PCP: No primary care provider on file.   Brief narrative:    80 y.o. male past medical history of Parkinson's, dementia diabetes mellitus type 2, bipolar disorder who resides at an assisted living facility who was brought in for hypoxia per EMS report patient was not feeling well for the last several days when they got to the facility he was satting in the 60s was put on a nonrebreather and saturations improved into the 80s, then transition to BiPAP. On arrival to the emergency room he was satting 89% on FiO2 100 with RR in 30's.  Assessment/Plan:    Acute respiratory failure with hypoxia ([name]) due to HCAP, right mid lower lobes  - pt clinically stable this AM but still confused, has been on [place], will transition to oral Levaquin today  - taper off oxygen as clinically possible   - sputum and blood cultures, urine legionella and strep pneumo, negative to date   Sepsis secondary to right mid lower lobes HCAP and Proteus bacteremia ([place]) - pt met criteria for sepsis on admission, HR < 90 and RR > 22, elevated lactic acid and source suspected to be HCAP, Proteus bacteremia - blood cultures with Proteus but source is unclear, ? UTI - continue ABX as noted above, transition to Levaquin based on sensitivity report - repeat blood cultures with 10/14 with no growth to date   Acute metabolic encephalopathy ([name]) - secondary to sepsis due to HCAP - still confused  Acute renal failure ([place]) - pre renal in etiology - resolved with IVF - BMP in AM  Bipolar disorder ([place]) - Continue current medications to changes were made.  Chronic indwelling Foley catheter - exchanged in the ED.  Chronic diastolic congestive heart failure ([name]) - Trace edema bilaterally in the ED - unable to exam as pt has refused  -  monitor weights, strict I/O - weights this AM 73.7 kg   Dementia without behavioral disturbance, Parkinson's disease  - Continue current regimen no changes were made to his medication.  Diabetes mellitus, type 2 ([name]) - A1C 5.6 - continue with SSI for now until oral intake improves   Malnutrition of moderate degree ([place]) - advance diet as pt able to tolerate  - appreciate nutritionist following   DVT prophylaxis - Heparin SQ  Code Status: DNR Family Communication:  No family at bedside  Disposition Plan: Not ready for discharge   IV access:  Peripheral IV  Procedures and diagnostic studies:    Dg Chest Port 1 View Oct 09, 2015  Areas of airspace consolidation in the right mid lower lung zones, most consistent with pneumonia. Lungs elsewhere clear. No change in cardiac silhouette. Followup PA and lateral chest radiographs recommended in 3-4 weeks following trial of antibiotic therapy to ensure resolution and exclude underlying malignancy.  Medical Consultants:  PCT  Other Consultants:  PT  IAnti-Infectives:   Vancomycin 10/09/2023 --> 10/16 Fortaz 09-Oct-2023 --> 10/16 Levaquin 10/16 -->  Dwayne Ramsay, MD  [facility] Pager [phone]  If 7PM-7AM, please contact night-coverage [email or web address] Password [email or web address] 10/03/2015, 12:25 PM   LOS: 3 days   HPI/Subjective: No events overnight. Still confused this AM.   Objective: Filed Vitals:   10/02/15 1832 10/02/15 2157 10/03/15 0152 10/03/15 0516  BP:  120/68  115/74  Pulse:  86  75  Temp:  97.5  F (36.4 C)  97.5 F (36.4 C)  TempSrc:  Oral  Oral  Resp:  20  20  Height: _0  (1.778 m)     Weight: 74.072 kg (163 lb 4.8 oz)   73.256 kg (161 lb 8 oz)  SpO2:  100% 94% 93%    Intake/Output Summary (Last 24 hours) at 10/03/15 1225 Last data filed at 10/03/15 0522  Gross per 24 hour  Intake 163.17 ml  Output   1130 ml  Net -966.83 ml    Exam:  Constitutional: Appears confused. No distress.  CVS: RRR, S1/S2 +, no murmurs, no  gallops, no carotid bruit.  Pulmonary: Effort and breath sounds normal, no stridor, diminished breath sounds at bases  Abdominal: Soft. BS +,  no distension, tenderness, rebound or guarding.  Musculoskeletal: Normal range of motion. No edema and no tenderness.   Data Reviewed: Basic Metabolic Panel:  Recent Labs Lab 09/30/15 0740 10/01/15 0540 10/02/15 0543 10/03/15 0535  NA 137 141 139 139  K 4.5 3.9 3.7 3.8  CL 106 110 107 106  CO2 _1 GLUCOSE 226* 108* 133* 126*  BUN 53* 42* 30* 29*  CREATININE 1.82* 1.24 1.12 1.17  CALCIUM 8.6* 8.8* 8.4* 8.7*   Liver Function Tests:  Recent Labs Lab 09/30/15 0740  AST 18  ALT 7*  ALKPHOS 73  BILITOT 0.7  PROT 6.1*  ALBUMIN 3.7   CBC:  Recent Labs Lab 09/30/15 0740 10/01/15 0540 10/02/15 0543 10/03/15 0535  WBC 7.9 9.6 8.0 8.0  NEUTROABS 7.1  --   --   --   HGB 13.0 11.4* 10.3* 12.0*  HCT 40.0 34.8* 31.8* 37.0*  MCV 89.9 88.8 88.3 89.6  PLT 223 202 187 209   CBG:  Recent Labs Lab 10/02/15 1334 10/02/15 1708 10/02/15 2156 10/03/15 0734 10/03/15 1149  GLUCAP 146* 105* 124* 103* 229*    Recent Results (from the past 240 hour(s))  Blood Culture (routine x 2)     Status: None   Collection Time: 09/30/15  7:40 AM  Result Value Ref Range Status   Specimen Description BLOOD RIGHT ANTECUBITAL  Final   Special Requests BOTTLES DRAWN AEROBIC AND ANAEROBIC 5ML  Final   Culture  Setup Time   Final    GRAM NEGATIVE RODS IN BOTH AEROBIC AND ANAEROBIC BOTTLES CRITICAL RESULT CALLED TO, READ BACK BY AND VERIFIED WITH: B DAVIS RN 2229 09/30/15 A BROWNING    Culture   Final    PROTEUS MIRABILIS SUSCEPTIBILITIES PERFORMED ON PREVIOUS CULTURE WITHIN THE LAST 5 DAYS. Performed at [facility]    Report Status 10/02/2015 FINAL  Final  Blood Culture (routine x 2)     Status: None   Collection Time: 09/30/15  7:45 AM  Result Value Ref Range Status   Specimen Description BLOOD LEFT ANTECUBITAL  Final    Special Requests BOTTLES DRAWN AEROBIC AND ANAEROBIC 5CC  Final   Culture  Setup Time   Final    GRAM NEGATIVE RODS ANAEROBIC BOTTLE ONLY CRITICAL RESULT CALLED TO, READ BACK BY AND VERIFIED WITH: B DAVIS RN 2309 09/30/15 A BROWNING    Culture   Final    PROTEUS MIRABILIS Performed at [facility]    Report Status 10/02/2015 FINAL  Final   Organism ID, Bacteria PROTEUS MIRABILIS  Final      Susceptibility   Proteus mirabilis - MIC*    AMPICILLIN <=2 SENSITIVE Sensitive     CEFAZOLIN <=4 SENSITIVE Sensitive  CEFEPIME <=1 SENSITIVE Sensitive     CEFTAZIDIME <=1 SENSITIVE Sensitive     CEFTRIAXONE <=1 SENSITIVE Sensitive     CIPROFLOXACIN <=0.25 SENSITIVE Sensitive     GENTAMICIN <=1 SENSITIVE Sensitive     IMIPENEM 2 SENSITIVE Sensitive     TRIMETH/SULFA <=20 SENSITIVE Sensitive     AMPICILLIN/SULBACTAM <=2 SENSITIVE Sensitive     PIP/TAZO <=4 SENSITIVE Sensitive     * PROTEUS MIRABILIS  Urine culture     Status: None   Collection Time: 09/30/15 10:05 AM  Result Value Ref Range Status   Specimen Description URINE, CATHETERIZED  Final   Special Requests NONE  Final   Culture   Final    MULTIPLE SPECIES PRESENT, SUGGEST RECOLLECTION Performed at [facility]    Report Status 10/01/2015 FINAL  Final  MRSA PCR Screening     Status: Abnormal   Collection Time: 09/30/15 11:05 AM  Result Value Ref Range Status   MRSA by PCR POSITIVE (A) NEGATIVE Final    Comment:        The GeneXpert MRSA Assay (FDA approved for NASAL specimens only), is one component of a comprehensive MRSA colonization surveillance program. It is not intended to diagnose MRSA infection nor to guide or monitor treatment for MRSA infections. RESULT CALLED TO, READ BACK BY AND VERIFIED WITH: CAROLYN CREECH,RN [id number] @ 1761 BY J SCOTTON   Culture, blood (routine x 2)     Status: None (Preliminary result)   Collection Time: 10/01/15  2:45 PM  Result Value Ref Range Status   Specimen  Description BLOOD LEFT ARM  Final   Special Requests BOTTLES DRAWN AEROBIC AND ANAEROBIC 10 CC EA  Final   Culture   Final    NO GROWTH < 24 HOURS Performed at [facility]    Report Status PENDING  Incomplete  Culture, blood (routine x 2)     Status: None (Preliminary result)   Collection Time: 10/01/15  2:55 PM  Result Value Ref Range Status   Specimen Description BLOOD RIGHT HAND  Final   Special Requests BOTTLES DRAWN AEROBIC AND ANAEROBIC 8 CC EA  Final   Culture   Final    NO GROWTH < 24 HOURS Performed at [facility]    Report Status PENDING  Incomplete  Urine culture     Status: None (Preliminary result)   Collection Time: 10/01/15  3:01 PM  Result Value Ref Range Status   Specimen Description URINE, CATHETERIZED  Final   Special Requests NONE  Final   Culture   Final    NO GROWTH < 24 HOURS Performed at [facility]    Report Status PENDING  Incomplete     Scheduled Meds: . antiseptic oral rinse  7 mL Mouth Rinse BID  . aspirin  81 mg Oral Daily  . carbidopa-levodopa  1 tablet Oral TID  . cefTAZidime (FORTAZ)  IV  2 g Intravenous Q8H  . clonazePAM  0.25 mg Oral Q1200  . clonazePAM  0.5 mg Oral QHS  . entacapone  200 mg Oral TID  . famotidine  20 mg Oral BID  . feeding supplement (ENSURE ENLIVE)  237 mL Oral BID BM  . feeding supplement (PRO-STAT SUGAR FREE 64)  30 mL Oral Daily  . heparin  5,000 Units Subcutaneous 3 times per day  . insulin aspart  0-9 Units Subcutaneous TID WC  . lamoTRIgine  150 mg Oral BID  . predniSONE  5 mg Oral Q breakfast  .  QUEtiapine  75 mg Oral QHS  . sertraline  200 mg Oral Daily  . sodium chloride  3 mL Intravenous Q12H  . traZODone  100 mg Oral QHS  . vancomycin  750 mg Intravenous Q12H   Continuous Infusions: . sodium chloride 10 mL/hr at 10/03/15 0533

## 2015-10-03 NOTE — Evaluation (Signed)
Physical Therapy Evaluation Patient Details Name: [name] MRN: [id number] DOB: 26-Apr-1935 Today's Date: 10/03/2015   History of Present Illness  [name] is an 80 y.o. male adm with SOB, hypoxia; past medical history of Parkinson's, dementia diabetes mellitus type 2, bipolar disorder who resides at an assisted living facility.   Clinical Impression  Patient evaluated by Physical Therapy with no further acute PT needs identified. All education has been completed and the patient has no further questions.  See below for any follow-up Physial Therapy or equipment needs. PT is signing off. Thank you for this referral. Pt cooperative only with incr encouragement and incr time; he is easily agitated and not receptive to PT input reagarding mobility; he may benefit from HHPT but not sure if he will cooperate; He is at or near his baseline for transfers and bed mobility and does not want therapy to follow while in hospital, signing off     Follow Up Recommendations Home health PT;Supervision for mobility/OOB (pt may benefit but not sure if he will agree, he doesn't PT to follow in acute)    Equipment Recommendations  None recommended by PT    Recommendations for Other Services       Precautions / Restrictions Precautions Precautions: Fall      Mobility  Bed Mobility Overal bed mobility: Modified Independent                Transfers Overall transfer level: Needs assistance Equipment used: None Transfers: Sit to/from [facility] Sit to Stand: Supervision;Min guard Stand pivot transfers: Supervision;Min guard       General transfer comment: for lines and safety  Ambulation/Gait             General Gait Details: pt was non-amb prior to adm  Stairs            Wheelchair Mobility    Modified Rankin (Stroke Patients Only)       Balance Overall balance assessment: Needs assistance   Sitting balance-Leahy Scale: Fair Sitting  balance - Comments: at least fair     Standing balance-Leahy Scale: Fair Standing balance comment: able to maintain standing only briefly, transfers without LOb but uncontrolled sit                             Pertinent Vitals/Pain Pain Assessment: No/denies pain    Home Living Family/patient expects to be discharged to:: Assisted living               Home Equipment: Wheelchair - manual      Prior Function           Comments: pt reports he transfers to w/c I'ly, self propels, does most everything on his own; He is easily agitated by PTs questions and is difficult to get details from him on his functional status     Hand Dominance        Extremity/Trunk Assessment   Upper Extremity Assessment: Overall WFL for tasks assessed           Lower Extremity Assessment: Generalized weakness         Communication   Communication: HOH  Cognition Arousal/Alertness: Awake/alert Behavior During Therapy: Agitated (easily agitated)   Area of Impairment: Memory;Following commands;Problem solving     Memory: Decreased short-term memory Following Commands: Follows one step commands with increased time     Problem Solving: Slow processing;Decreased initiation;Difficulty sequencing;Requires verbal cues;Requires tactile cues  General Comments: decr STM--pt repeatedly asked why therapist was there and which MD ordered it;     General Comments      Exercises        Assessment/Plan    PT Assessment All further PT needs can be met in the next venue of care  PT Diagnosis Generalized weakness   PT Problem List Decreased activity tolerance;Decreased balance;Decreased mobility  PT Treatment Interventions     PT Goals (Current goals can be found in the Care Plan section) Acute Rehab PT Goals Patient Stated Goal: to get out of here PT Goal Formulation: All assessment and education complete, DC therapy    Frequency     Barriers to discharge         Co-evaluation               End of Session   Activity Tolerance: Patient tolerated treatment well Patient left: in chair;with call bell/phone within reach;with chair alarm set Nurse Communication: Mobility status         Time: 8185-6314 PT Time Calculation (min) (ACUTE ONLY): 20 min   Charges:   PT Evaluation $Initial PT Evaluation Tier I: 1 Procedure     PT G CodesKenyon Ana October 18, 2015, 2:48 PM

## 2015-10-03 NOTE — Progress Notes (Signed)
ANTIBIOTIC CONSULT NOTE - Follow up  Pharmacy Consult for Vancomycin, r enally adjust antibiotics Indication: HCAP, UTI  Allergies  Allergen Reactions  . Cholestatin Other (See Comments)    Per MAR    Patient Measurements: Height: 5\' 10"  (177.8 cm) Weight: 161 lb 8 oz (73.256 kg) IBW/kg (Calculated) : 73  Vital Signs: Temp: 97.5 F (36.4 C) (10/16 0516) Temp Source: Oral (10/16 0516) BP: 115/74 mmHg (10/16 0516) Pulse Rate: 75 (10/16 0516) Intake/Output from previous day: 10/15 0701 - 10/16 0700 In: 713.2 [P.O.:360; I.V.:103.2; IV Piggyback:250] Out: 1330 [Urine:1330] Intake/Output from this shift:    Labs:  Recent Labs  10/01/15 0540 10/02/15 0543 10/03/15 0535  WBC 9.6 8.0 8.0  HGB 11.4* 10.3* 12.0*  PLT 202 187 209  CREATININE 1.24 1.12 1.17   Estimated Creatinine Clearance: 53.7 mL/min (by C-G formula based on Cr of 1.17). No results for input(s): VANCOTROUGH, VANCOPEAK, VANCORANDOM, GENTTROUGH, GENTPEAK, GENTRANDOM, TOBRATROUGH, TOBRAPEAK, TOBRARND, AMIKACINPEAK, AMIKACINTROU, AMIKACIN in the last 72 hours.   Microbiology: Recent Results (from the past 720 hour(s))  Blood Culture (routine x 2)     Status: None   Collection Time: 09/30/15  7:40 AM  Result Value Ref Range Status   Specimen Description BLOOD RIGHT ANTECUBITAL  Final   Special Requests BOTTLES DRAWN AEROBIC AND ANAEROBIC 5ML  Final   Culture  Setup Time   Final    GRAM NEGATIVE RODS IN BOTH AEROBIC AND ANAEROBIC BOTTLES CRITICAL RESULT CALLED TO, READ BACK BY AND VERIFIED WITH: B DAVIS RN 2229 09/30/15 A BROWNING    Culture   Final    PROTEUS MIRABILIS SUSCEPTIBILITIES PERFORMED ON PREVIOUS CULTURE WITHIN THE LAST 5 DAYS. Performed at [facility]    Report Status 10/02/2015 FINAL  Final  Blood Culture (routine x 2)     Status: None   Collection Time: 09/30/15  7:45 AM  Result Value Ref Range Status   Specimen Description BLOOD LEFT ANTECUBITAL  Final   Special Requests  BOTTLES DRAWN AEROBIC AND ANAEROBIC 5CC  Final   Culture  Setup Time   Final    GRAM NEGATIVE RODS ANAEROBIC BOTTLE ONLY CRITICAL RESULT CALLED TO, READ BACK BY AND VERIFIED WITH: B DAVIS RN 2309 09/30/15 A BROWNING    Culture   Final    PROTEUS MIRABILIS Performed at [facility]    Report Status 10/02/2015 FINAL  Final   Organism ID, Bacteria PROTEUS MIRABILIS  Final      Susceptibility   Proteus mirabilis - MIC*    AMPICILLIN <=2 SENSITIVE Sensitive     CEFAZOLIN <=4 SENSITIVE Sensitive     CEFEPIME <=1 SENSITIVE Sensitive     CEFTAZIDIME <=1 SENSITIVE Sensitive     CEFTRIAXONE <=1 SENSITIVE Sensitive     CIPROFLOXACIN <=0.25 SENSITIVE Sensitive     GENTAMICIN <=1 SENSITIVE Sensitive     IMIPENEM 2 SENSITIVE Sensitive     TRIMETH/SULFA <=20 SENSITIVE Sensitive     AMPICILLIN/SULBACTAM <=2 SENSITIVE Sensitive     PIP/TAZO <=4 SENSITIVE Sensitive     * PROTEUS MIRABILIS  Urine culture     Status: None   Collection Time: 09/30/15 10:05 AM  Result Value Ref Range Status   Specimen Description URINE, CATHETERIZED  Final   Special Requests NONE  Final   Culture   Final    MULTIPLE SPECIES PRESENT, SUGGEST RECOLLECTION Performed at [facility]    Report Status 10/01/2015 FINAL  Final  MRSA PCR Screening  Status: Abnormal   Collection Time: 09/30/15 11:05 AM  Result Value Ref Range Status   MRSA by PCR POSITIVE (A) NEGATIVE Final    Comment:        The GeneXpert MRSA Assay (FDA approved for NASAL specimens only), is one component of a comprehensive MRSA colonization surveillance program. It is not intended to diagnose MRSA infection nor to guide or monitor treatment for MRSA infections. RESULT CALLED TO, READ BACK BY AND VERIFIED WITH: CAROLYN CREECH,RN [id number] @ 5465 BY J SCOTTON   Culture, blood (routine x 2)     Status: None (Preliminary result)   Collection Time: 10/01/15  2:45 PM  Result Value Ref Range Status   Specimen Description BLOOD LEFT  ARM  Final   Special Requests BOTTLES DRAWN AEROBIC AND ANAEROBIC 10 CC EA  Final   Culture   Final    NO GROWTH < 24 HOURS Performed at [facility]    Report Status PENDING  Incomplete  Culture, blood (routine x 2)     Status: None (Preliminary result)   Collection Time: 10/01/15  2:55 PM  Result Value Ref Range Status   Specimen Description BLOOD RIGHT HAND  Final   Special Requests BOTTLES DRAWN AEROBIC AND ANAEROBIC 8 CC EA  Final   Culture   Final    NO GROWTH < 24 HOURS Performed at [facility]    Report Status PENDING  Incomplete  Urine culture     Status: None (Preliminary result)   Collection Time: 10/01/15  3:01 PM  Result Value Ref Range Status   Specimen Description URINE, CATHETERIZED  Final   Special Requests NONE  Final   Culture   Final    NO GROWTH < 24 HOURS Performed at [facility]    Report Status PENDING  Incomplete   Assessment: 22 yoM presents to ED on 10/13 with SOB and hypoxia from ALF.  PMH includes Parkinson's, dementia, DM, indwelling foley catheter, and falls.  He was most recently admitted 02/2015 with Klebsiella UTI (culture date 3/25) and Pseudomonas UTI (culture date 3/29).  He was placed on bipap in the ED.  CXR is c/w pneumonia in right mid lower lung zones.  He is now admitted with HCAP, and pharmacy is consulted to dose Vancomycin and renally adjust Ceftazidime.  First doses of vancomycin and Zosyn were given in ED.  10/13 >> Zosyn x1 10/13 >> Vanc >> 10/13 >> Ceftaz >>    10/13 MRSA PCR: positive 10/13 Strep.pneumo and legionella urinary Ag: neg  10/13 blood x2: Proteus - pan sens 10/13 urine: suggest recollection 10/14 urine: ngtd 10/14 blood x2: ngtd Sputum: "No result". Sent?  Today, 10/03/2015: Afebrile, WBC remain WNL SCr improved (baseline near 1-1.4), CrCl ~54 ml/min. Lactic acid: 1.46 -> 0.3  Goal of Therapy:  Vancomycin trough level 15-20 mcg/ml  Plan:  Cont Ceftazidime 2g IV q12h Cont Vanc  750mg  IV q12h. Measure Vanc trough tonight.  Follow up renal fxn, culture results, and clinical course.  Romeo Rabon, PharmD, pager [phone]. 10/03/2015,1:04 PM.  Addendum:  Antibiotics narrowed to oral Levaquin with pharmacy to dose but with confusion/dementia, borderline CrCl, and concomitant QT-prolonging agents, I spoke with  Dr. Doyle Askew and the order was changed to Augmentin. Will start 500mg  PO q12h and adjust only if renal function changes.  Romeo Rabon, PharmD, pager [phone]. 10/03/2015,1:16 PM.

## 2015-10-03 NOTE — Progress Notes (Signed)
ANTIBIOTIC CONSULT NOTE - Follow up  Pharmacy Consult for Vancomycin, renally adjust antibiotics Indication: HCAP, UTI  Allergies  Allergen Reactions  . Cholestatin Other (See Comments)    Per MAR    Patient Measurements: Height: 5\' 10"  (177.8 cm) Weight: 161 lb 8 oz (73.256 kg) IBW/kg (Calculated) : 73  Vital Signs: Temp: 97.5 F (36.4 C) (10/16 0516) Temp Source: Oral (10/16 0516) BP: 115/74 mmHg (10/16 0516) Pulse Rate: 75 (10/16 0516) Intake/Output from previous day: 10/15 0701 - 10/16 0700 In: 713.2 [P.O.:360; I.V.:103.2; IV Piggyback:250] Out: 1330 [Urine:1330] Intake/Output from this shift:    Labs:  Recent Labs  10/01/15 0540 10/02/15 0543 10/03/15 0535  WBC 9.6 8.0 8.0  HGB 11.4* 10.3* 12.0*  PLT 202 187 209  CREATININE 1.24 1.12 1.17   Estimated Creatinine Clearance: 53.7 mL/min (by C-G formula based on Cr of 1.17). No results for input(s): VANCOTROUGH, VANCOPEAK, VANCORANDOM, GENTTROUGH, GENTPEAK, GENTRANDOM, TOBRATROUGH, TOBRAPEAK, TOBRARND, AMIKACINPEAK, AMIKACINTROU, AMIKACIN in the last 72 hours.   Microbiology: Recent Results (from the past 720 hour(s))  Blood Culture (routine x 2)     Status: None   Collection Time: 09/30/15  7:40 AM  Result Value Ref Range Status   Specimen Description BLOOD RIGHT ANTECUBITAL  Final   Special Requests BOTTLES DRAWN AEROBIC AND ANAEROBIC 5ML  Final   Culture  Setup Time   Final    GRAM NEGATIVE RODS IN BOTH AEROBIC AND ANAEROBIC BOTTLES CRITICAL RESULT CALLED TO, READ BACK BY AND VERIFIED WITH: B DAVIS RN 2229 09/30/15 A BROWNING    Culture   Final    PROTEUS MIRABILIS SUSCEPTIBILITIES PERFORMED ON PREVIOUS CULTURE WITHIN THE LAST 5 DAYS. Performed at [facility]    Report Status 10/02/2015 FINAL  Final  Blood Culture (routine x 2)     Status: None   Collection Time: 09/30/15  7:45 AM  Result Value Ref Range Status   Specimen Description BLOOD LEFT ANTECUBITAL  Final   Special Requests BOTTLES  DRAWN AEROBIC AND ANAEROBIC 5CC  Final   Culture  Setup Time   Final    GRAM NEGATIVE RODS ANAEROBIC BOTTLE ONLY CRITICAL RESULT CALLED TO, READ BACK BY AND VERIFIED WITH: B DAVIS RN 2309 09/30/15 A BROWNING    Culture   Final    PROTEUS MIRABILIS Performed at [facility]    Report Status 10/02/2015 FINAL  Final   Organism ID, Bacteria PROTEUS MIRABILIS  Final      Susceptibility   Proteus mirabilis - MIC*    AMPICILLIN <=2 SENSITIVE Sensitive     CEFAZOLIN <=4 SENSITIVE Sensitive     CEFEPIME <=1 SENSITIVE Sensitive     CEFTAZIDIME <=1 SENSITIVE Sensitive     CEFTRIAXONE <=1 SENSITIVE Sensitive     CIPROFLOXACIN <=0.25 SENSITIVE Sensitive     GENTAMICIN <=1 SENSITIVE Sensitive     IMIPENEM 2 SENSITIVE Sensitive     TRIMETH/SULFA <=20 SENSITIVE Sensitive     AMPICILLIN/SULBACTAM <=2 SENSITIVE Sensitive     PIP/TAZO <=4 SENSITIVE Sensitive     * PROTEUS MIRABILIS  Urine culture     Status: None   Collection Time: 09/30/15 10:05 AM  Result Value Ref Range Status   Specimen Description URINE, CATHETERIZED  Final   Special Requests NONE  Final   Culture   Final    MULTIPLE SPECIES PRESENT, SUGGEST RECOLLECTION Performed at [facility]    Report Status 10/01/2015 FINAL  Final  MRSA PCR Screening     Status:  Abnormal   Collection Time: 09/30/15 11:05 AM  Result Value Ref Range Status   MRSA by PCR POSITIVE (A) NEGATIVE Final    Comment:        The GeneXpert MRSA Assay (FDA approved for NASAL specimens only), is one component of a comprehensive MRSA colonization surveillance program. It is not intended to diagnose MRSA infection nor to guide or monitor treatment for MRSA infections. RESULT CALLED TO, READ BACK BY AND VERIFIED WITH: CAROLYN CREECH,RN [id number] @ 5093 BY J SCOTTON   Culture, blood (routine x 2)     Status: None (Preliminary result)   Collection Time: 10/01/15  2:45 PM  Result Value Ref Range Status   Specimen Description BLOOD LEFT ARM   Final   Special Requests BOTTLES DRAWN AEROBIC AND ANAEROBIC 10 CC EA  Final   Culture   Final    NO GROWTH < 24 HOURS Performed at [facility]    Report Status PENDING  Incomplete  Culture, blood (routine x 2)     Status: None (Preliminary result)   Collection Time: 10/01/15  2:55 PM  Result Value Ref Range Status   Specimen Description BLOOD RIGHT HAND  Final   Special Requests BOTTLES DRAWN AEROBIC AND ANAEROBIC 8 CC EA  Final   Culture   Final    NO GROWTH < 24 HOURS Performed at [facility]    Report Status PENDING  Incomplete  Urine culture     Status: None (Preliminary result)   Collection Time: 10/01/15  3:01 PM  Result Value Ref Range Status   Specimen Description URINE, CATHETERIZED  Final   Special Requests NONE  Final   Culture   Final    NO GROWTH < 24 HOURS Performed at [facility]    Report Status PENDING  Incomplete   Assessment: 89 yoM presents to ED on 10/13 with SOB and hypoxia from ALF.  PMH includes Parkinson's, dementia, DM, indwelling foley catheter, and falls.  He was most recently admitted 02/2015 with Klebsiella UTI (culture date 3/25) and Pseudomonas UTI (culture date 3/29).  He was placed on bipap in the ED.  CXR is c/w pneumonia in right mid lower lung zones.  He is now admitted with HCAP, and pharmacy is consulted to dose Vancomycin and renally adjust Ceftazidime.  First doses of vancomycin and Zosyn were given in ED.  10/13 >> Zosyn x1 10/13 >> Vanc >> 10/13 >> Ceftaz >>    10/13 MRSA PCR: positive 10/13 Strep.pneumo and legionella urinary Ag: neg  10/13 blood x2: Proteus - pan sens 10/13 urine: suggest recollection 10/14 urine: ngtd 10/14 blood x2: ngtd Sputum: "No result". Sent?  Today, 10/03/2015: Afebrile, WBC remain WNL SCr improved (baseline near 1-1.4), CrCl ~54 ml/min. Lactic acid: 1.46 -> 0.3  Goal of Therapy:  Vancomycin trough level 15-20 mcg/ml  Plan:  Cont Ceftazidime 2g IV q12h Cont Vanc 750mg  IV  q12h. Measure Vanc trough tonight.  Follow up renal fxn, culture results, and clinical course.  Romeo Rabon, PharmD, pager [phone]. 10/03/2015,9:47 AM.

## 2015-10-04 LAB — BASIC METABOLIC PANEL
Anion gap: 6 (ref 5–15)
BUN: 31 mg/dL — ABNORMAL HIGH (ref 6–20)
CALCIUM: 8.6 mg/dL — AB (ref 8.9–10.3)
CO2: 28 mmol/L (ref 22–32)
CREATININE: 1.2 mg/dL (ref 0.61–1.24)
Chloride: 107 mmol/L (ref 101–111)
GFR calc Af Amer: >60 mL/min (ref >60)
GFR calc non Af Amer: 56 mL/min — ABNORMAL LOW (ref >60)
Glucose, Bld: 112 mg/dL — ABNORMAL HIGH (ref 65–99)
Potassium: 4.1 mmol/L (ref 3.5–5.1)
Sodium: 141 mmol/L (ref 135–145)

## 2015-10-04 LAB — CBC
HCT: 35 % — ABNORMAL LOW (ref 39.0–52.0)
Hemoglobin: 11.3 g/dL — ABNORMAL LOW (ref 13.0–17.0)
MCH: 28.9 pg (ref 26.0–34.0)
MCHC: 32.3 g/dL (ref 30.0–36.0)
MCV: 89.5 fL (ref 78.0–100.0)
PLATELETS: 197 10*3/uL (ref 150–400)
RBC: 3.91 MIL/uL — ABNORMAL LOW (ref 4.22–5.81)
RDW: 15.6 % — AB (ref 11.5–15.5)
WBC: 8.4 10*3/uL (ref 4.0–10.5)

## 2015-10-04 LAB — GLUCOSE, CAPILLARY
Glucose-Capillary: 107 mg/dL — ABNORMAL HIGH (ref 65–99)
Glucose-Capillary: 123 mg/dL — ABNORMAL HIGH (ref 65–99)

## 2015-10-04 MED ORDER — AMOXICILLIN-POT CLAVULANATE 500-125 MG PO TABS: 1.0000 | ORAL_TABLET | Freq: Two times a day (BID) | ORAL | Status: DC

## 2015-10-04 NOTE — Progress Notes (Signed)
Report called to [facility] and spoke with Elsworth Soho in regards to pt needing albuterol treatment as needed, continue to take his augmentin and follow up with his PCP.

## 2015-10-04 NOTE — Discharge Summary (Signed)
Physician Discharge Summary  [name] AYO:[id number] DOB: 1935/09/27 DOA: 09/30/2015  PCP: No primary care provider on file.  Admit date: 09/30/2015 Discharge date: 10/04/2015  Recommendations for Outpatient Follow-up:  1. Pt will need to follow up with PCP in 2-3 weeks post discharge 2. Please obtain BMP to evaluate electrolytes and kidney function 3. Please also check CBC to evaluate Hg and Hct levels 4. Continue Augmentin for 9 more days post discharge  5. Please note recommended Dys I diet with aspiration precautions  6. Please note that we have stopped Lisinopril as BP on lower end of normal, can resume once indicated  7. Pt has refused Protonix and Mucinex   Discharge Diagnoses:  Principal Problem:   Acute respiratory failure with hypoxia ([name]) Active Problems:   Right lower lobe pneumonia   Sepsis due to Gram-negative organism with acute respiratory failure ([name])   Acute renal failure ([name])   Parkinson disease ([name])   Diabetes mellitus, type 2 ([name])   Malnutrition of moderate degree ([name])   Hypertension   Bipolar disorder ([place])   Chronic indwelling Foley catheter   Chronic diastolic congestive heart failure ([name])   Dementia without behavioral disturbance   Pressure ulcer   HCAP (healthcare-associated pneumonia)  Discharge Condition: Stable  Diet recommendation: Dys I diet   Brief narrative:    80 y.o. male past medical history of Parkinson's, dementia diabetes mellitus type 2, bipolar disorder who resides at an assisted living facility who was brought in for hypoxia per EMS report patient was not feeling well for the last several days when they got to the facility he was satting in the 60s was put on a nonrebreather and saturations improved into the 80s, then transition to BiPAP. On arrival to the emergency room he was satting 89% on FiO2 100 with RR in 30's.  Assessment/Plan:    Acute respiratory failure with hypoxia ([name]) due to HCAP, right mid lower  lobes  - pt clinically stable this AM but still confused, has been on [place],. Has been transitioned to oral Augmentin, continue for 9 more days post discharge  - sputum and blood cultures, urine legionella and strep pneumo, negative to date   Sepsis secondary to right mid lower lobes HCAP and Proteus bacteremia ([name]) - pt met criteria for sepsis on admission, HR < 90 and RR > 22, elevated lactic acid and source suspected to be HCAP, Proteus bacteremia - please note that proteus was in the blood and the source of this is potentially from urine as pt with chronic indwelling urinary cath  - continue ABX as noted above, transition to Augmentin per sensitivity report  - repeat blood cultures with 10/14 with no growth to date   Acute metabolic encephalopathy ([name]) - secondary to sepsis due to HCAP - still intermittently confused, wife says this is his baseline   Acute renal failure ([name]) - pre renal in etiology - resolved with IVF  Bipolar disorder ([name]) - Continue current medications to changes were made.  Chronic indwelling Foley catheter - exchanged in the ED.  Chronic diastolic congestive heart failure ([name]) - Trace edema bilaterally in the ED - unable to exam as pt has refused  - monitor weights, strict I/O - weights this AM 72 kg   Dementia without behavioral disturbance, Parkinson's disease  - Continue current regimen no changes were made to his medication.  Diabetes mellitus, type 2 ([name]) - A1C 5.6 - continue with SSI for now until oral intake improves  Malnutrition of moderate degree ([name]) - advance diet as pt able to tolerate  - appreciate nutritionist following   DVT prophylaxis - Heparin SQ  Code Status: DNR Family Communication: No family at bedside , wife over the phone  Disposition Plan: [name]   IV access:  Peripheral IV  Procedures and diagnostic studies:   Dg Chest Port 1 View 09/30/2015 Areas of airspace consolidation in  the right mid lower lung zones, most consistent with pneumonia. Lungs elsewhere clear. No change in cardiac silhouette. Followup PA and lateral chest radiographs recommended in 3-4 weeks following trial of antibiotic therapy to ensure resolution and exclude underlying malignancy.  Medical Consultants:  PCT  Other Consultants:  PT  IAnti-Infectives:   Vancomycin 10/13 --> 10/16 Fortaz 10/13 --> 10/16 Augmentin 10/16 -->      Discharge Exam: Filed Vitals:   10/04/15 0603  BP: 122/67  Pulse: 67  Temp: 98.2 F (36.8 C)  Resp: 20   Filed Vitals:   10/03/15 0516 10/03/15 1313 10/03/15 2035 10/04/15 0603  BP: 115/74 107/72 140/73 122/67  Pulse: 75 68 79 67  Temp: 97.5 F (36.4 C) 98.1 F (36.7 C) 98.4 F (36.9 C) 98.2 F (36.8 C)  TempSrc: Oral Oral Oral Oral  Resp: '20 18 20 20  ' Height:      Weight: 73.256 kg (161 lb 8 oz)   72.394 kg (159 lb 9.6 oz)  SpO2: 93% 99% 99% 98%    General: Pt is alert, confused, refused examination   Discharge Instructions  Discharge Instructions    Diet - low sodium heart healthy    Complete by:  As directed      Increase activity slowly    Complete by:  As directed             Medication List    STOP taking these medications        guaiFENesin 600 MG 12 hr tablet  Commonly known as:  MUCINEX     lisinopril 10 MG tablet  Commonly known as:  PRINIVIL,ZESTRIL     loratadine 10 MG tablet  Commonly known as:  CLARITIN     pantoprazole 40 MG tablet  Commonly known as:  PROTONIX      TAKE these medications        acetaminophen 325 MG tablet  Commonly known as:  TYLENOL  Take 650 mg by mouth every 6 (six) hours as needed for mild pain.     albuterol (2.5 MG/3ML) 0.083% nebulizer solution  Commonly known as:  PROVENTIL  Take 3 mLs (2.5 mg total) by nebulization every 2 (two) hours as needed for wheezing or shortness of breath.     amLODipine 5 MG tablet  Commonly known as:  NORVASC  Take 1 tablet (5 mg total) by  mouth daily.     amoxicillin-clavulanate 500-125 MG tablet  Commonly known as:  AUGMENTIN  Take 1 tablet (500 mg total) by mouth every 12 (twelve) hours.     aspirin 81 MG chewable tablet  Chew 81 mg by mouth daily.     bisacodyl 10 MG suppository  Commonly known as:  DULCOLAX  Place 10 mg rectally daily as needed for moderate constipation.     carbidopa-levodopa 25-250 MG tablet  Commonly known as:  SINEMET IR  Take 1 tablet by mouth 3 (three) times daily.     clonazePAM 0.5 MG tablet  Commonly known as:  KLONOPIN  Take 0.25-0.5 mg by mouth 2 (two) times daily. Takes  1/2 tablet at noon and 1 tablet at bedtime     dimethicone-zinc oxide cream  Apply topically 2 (two) times daily as needed for dry skin.     entacapone 200 MG tablet  Commonly known as:  COMTAN  Take 200 mg by mouth 3 (three) times daily.     famotidine 20 MG tablet  Commonly known as:  PEPCID  Take 20 mg by mouth 2 (two) times daily.     GLUCERNA Liqd  Take 237 mLs by mouth daily.     hydrALAZINE 25 MG tablet  Commonly known as:  APRESOLINE  Take 25 mg by mouth 2 (two) times daily.     ipratropium-albuterol 0.5-2.5 (3) MG/3ML Soln  Commonly known as:  DUONEB  Take 3 mLs by nebulization 3 (three) times daily.     lamoTRIgine 150 MG tablet  Commonly known as:  LAMICTAL  Take 1 tablet (150 mg total) by mouth 2 (two) times daily.     memantine tablet pack  Commonly known as:  NAMENDA TITRATION PAK  5 mg/day for =1 week; 5 mg twice daily for =1 week; 15 mg/day given in 5 mg and 10 mg separated doses for =1 week; then 10 mg twice daily     multivitamin with minerals Tabs tablet  Take 1 tablet by mouth daily.     predniSONE 2.5 MG tablet  Commonly known as:  DELTASONE  Take 2.5 mg by mouth daily with breakfast.     QUEtiapine 25 MG tablet  Commonly known as:  SEROQUEL  Take 75 mg by mouth at bedtime.     sertraline 100 MG tablet  Commonly known as:  ZOLOFT  Take 2 tablets (200 mg total) by mouth  daily.     traZODone 100 MG tablet  Commonly known as:  DESYREL  Take 1 tablet (100 mg total) by mouth at bedtime.          The results of significant diagnostics from this hospitalization (including imaging, microbiology, ancillary and laboratory) are listed below for reference.     Microbiology: Recent Results (from the past 240 hour(s))  Blood Culture (routine x 2)     Status: None   Collection Time: 09/30/15  7:40 AM  Result Value Ref Range Status   Specimen Description BLOOD RIGHT ANTECUBITAL  Final   Special Requests BOTTLES DRAWN AEROBIC AND ANAEROBIC 5ML  Final   Culture  Setup Time   Final    GRAM NEGATIVE RODS IN BOTH AEROBIC AND ANAEROBIC BOTTLES CRITICAL RESULT CALLED TO, READ BACK BY AND VERIFIED WITH: B DAVIS RN 2229 09/30/15 A BROWNING    Culture   Final    PROTEUS MIRABILIS SUSCEPTIBILITIES PERFORMED ON PREVIOUS CULTURE WITHIN THE LAST 5 DAYS. Performed at [facility]    Report Status 10/02/2015 FINAL  Final  Blood Culture (routine x 2)     Status: None   Collection Time: 09/30/15  7:45 AM  Result Value Ref Range Status   Specimen Description BLOOD LEFT ANTECUBITAL  Final   Special Requests BOTTLES DRAWN AEROBIC AND ANAEROBIC 5CC  Final   Culture  Setup Time   Final    GRAM NEGATIVE RODS ANAEROBIC BOTTLE ONLY CRITICAL RESULT CALLED TO, READ BACK BY AND VERIFIED WITH: B DAVIS RN 2309 09/30/15 A BROWNING    Culture   Final    PROTEUS MIRABILIS Performed at [facility]    Report Status 10/02/2015 FINAL  Final   Organism ID, Bacteria PROTEUS MIRABILIS  Final      Susceptibility   Proteus mirabilis - MIC*    AMPICILLIN <=2 SENSITIVE Sensitive     CEFAZOLIN <=4 SENSITIVE Sensitive     CEFEPIME <=1 SENSITIVE Sensitive     CEFTAZIDIME <=1 SENSITIVE Sensitive     CEFTRIAXONE <=1 SENSITIVE Sensitive     CIPROFLOXACIN <=0.25 SENSITIVE Sensitive     GENTAMICIN <=1 SENSITIVE Sensitive     IMIPENEM 2 SENSITIVE Sensitive     TRIMETH/SULFA  <=20 SENSITIVE Sensitive     AMPICILLIN/SULBACTAM <=2 SENSITIVE Sensitive     PIP/TAZO <=4 SENSITIVE Sensitive     * PROTEUS MIRABILIS  Urine culture     Status: None   Collection Time: 09/30/15 10:05 AM  Result Value Ref Range Status   Specimen Description URINE, CATHETERIZED  Final   Special Requests NONE  Final   Culture   Final    MULTIPLE SPECIES PRESENT, SUGGEST RECOLLECTION Performed at [facility]    Report Status 10/01/2015 FINAL  Final  MRSA PCR Screening     Status: Abnormal   Collection Time: 09/30/15 11:05 AM  Result Value Ref Range Status   MRSA by PCR POSITIVE (A) NEGATIVE Final    Comment:        The GeneXpert MRSA Assay (FDA approved for NASAL specimens only), is one component of a comprehensive MRSA colonization surveillance program. It is not intended to diagnose MRSA infection nor to guide or monitor treatment for MRSA infections. RESULT CALLED TO, READ BACK BY AND VERIFIED WITH: CAROLYN CREECH,RN [id number] @ 57 BY J SCOTTON   Culture, blood (routine x 2)     Status: None (Preliminary result)   Collection Time: 10/01/15  2:45 PM  Result Value Ref Range Status   Specimen Description BLOOD LEFT ARM  Final   Special Requests BOTTLES DRAWN AEROBIC AND ANAEROBIC 10 CC EA  Final   Culture   Final    NO GROWTH 2 DAYS Performed at [facility]    Report Status PENDING  Incomplete  Culture, blood (routine x 2)     Status: None (Preliminary result)   Collection Time: 10/01/15  2:55 PM  Result Value Ref Range Status   Specimen Description BLOOD RIGHT HAND  Final   Special Requests BOTTLES DRAWN AEROBIC AND ANAEROBIC 8 CC EA  Final   Culture   Final    NO GROWTH 2 DAYS Performed at [facility]    Report Status PENDING  Incomplete  Urine culture     Status: None   Collection Time: 10/01/15  3:01 PM  Result Value Ref Range Status   Specimen Description URINE, CATHETERIZED  Final   Special Requests NONE  Final   Culture   Final    NO  GROWTH 2 DAYS Performed at [facility]    Report Status 10/03/2015 FINAL  Final     Labs: Basic Metabolic Panel:  Recent Labs Lab 09/30/15 0740 10/01/15 0540 10/02/15 0543 10/03/15 0535 10/04/15 0416  NA 137 141 139 139 141  K 4.5 3.9 3.7 3.8 4.1  CL 106 110 107 106 107  CO2 '23 23 23 25 28  ' GLUCOSE 226* 108* 133* 126* 112*  BUN 53* 42* 30* 29* 31*  CREATININE 1.82* 1.24 1.12 1.17 1.20  CALCIUM 8.6* 8.8* 8.4* 8.7* 8.6*   Liver Function Tests:  Recent Labs Lab 09/30/15 0740  AST 18  ALT 7*  ALKPHOS 73  BILITOT 0.7  PROT 6.1*  ALBUMIN 3.7  CBC:  Recent Labs Lab 09/30/15 0740 10/01/15 0540 10/02/15 0543 10/03/15 0535 10/04/15 0416  WBC 7.9 9.6 8.0 8.0 8.4  NEUTROABS 7.1  --   --   --   --   HGB 13.0 11.4* 10.3* 12.0* 11.3*  HCT 40.0 34.8* 31.8* 37.0* 35.0*  MCV 89.9 88.8 88.3 89.6 89.5  PLT 223 202 187 209 197   BNP (last 3 results)  Recent Labs  09/30/15 0740  BNP 85.5   CBG:  Recent Labs Lab 10/03/15 0734 10/03/15 1149 10/03/15 1637 10/03/15 2107 10/04/15 0721  GLUCAP 103* 229* 123* 105* 107*     SIGNED: Time coordinating discharge: 30 minutes  MAGICK-Lanise Mergen, MD  [facility] 10/04/2015, 11:43 AM Pager [phone]  If 7PM-7AM, please contact night-coverage [email or web address] Password [email or web address]

## 2015-10-04 NOTE — Progress Notes (Signed)
Pt with audible wheeze but in no distress. Albuterol neb given with some improvement. Oxygen sats 96% on room air with HR 74. Dr. Doyle Askew paged and updated. Pending response whether to continue with discharge or not.

## 2015-10-04 NOTE — Care Management Important Message (Signed)
Important Message  Patient Details IM Letter given to Cookie/Case Manager to present to [place] Message  Patient Details  Name: BRYDEN DARDEN MRN: [id number] Date of Birth: 1935/05/27   Medicare Important Message Given:  Yes-second notification given    Camillo Flaming 10/04/2015, 12:27 PM Name: RAMBO SARAFIAN MRN: [id number] Date of Birth: 1935-11-15   Medicare Important Message Given:  Yes-second notification given    Camillo Flaming 10/04/2015, 12:27 PM

## 2015-10-04 NOTE — Progress Notes (Signed)
Spoke with Dr. Doyle Askew in regards to pt respiratory status, and pt is ready to be discharged with follow up with PCP to continue treatments at [facility].

## 2015-10-04 NOTE — Discharge Instructions (Signed)
Bacteremia °Bacteremia is the presence of bacteria in the blood. A small amount of bacteria may not cause any symptoms. °Sometimes, the bacteria spread and cause infection in other parts of the body, such as the heart, joints, bones, or brain. Having a great amount of bacteria can cause a serious, sometimes life-threatening infection called sepsis. °CAUSES °This condition is caused by bacteria that get into the blood. Bacteria can enter the blood: °· During a dental or medical procedure. °· After you brush your teeth so hard that the gums bleed. °· Through a scrape or cut on your skin. °More severe types of bacteremia can be caused by: °· A bacterial infection, such as pneumonia, that spreads to the blood. °· Using a dirty needle. °RISK FACTORS °This condition is more likely to develop in: °· Children and elderly adults. °· People who have a long-lasting (chronic) disease or medical condition. °· People who have an artificial joint or heart valve. °· People who have heart valve disease. °· People who have a tube, such as a catheter or IV tube, that has been inserted for a medical treatment. °· People who have a weak body defense system (immune system). °· People who use IV drugs. °SYMPTOMS °Usually, this condition does not cause symptoms when it is mild. When it is more serious, it may cause: °· Fever. °· Chills. °· Racing heart. °· Shortness of breath. °· Dizziness. °· Weakness. °· Confusion. °· Nausea or vomiting. °· Diarrhea. °Bacteremia that has spread to other parts of the body may cause symptoms in those areas. °DIAGNOSIS °This condition may be diagnosed with a physical exam and tests, such as: °· A complete blood count (CBC). This test looks for signs of infection. °· Blood cultures. These look for bacteria in your blood. °· Tests of any IV tubes. These look for a source of infection. °· Urine tests. °· Imaging tests, such as an X-ray, CT scan, MRI, or heart ultrasound. °TREATMENT °If the condition is mild,  treatment is usually not needed. Usually, the body's immune system will remove the bacteria. If the condition is more serious, it may be treated with: °· Antibiotic medicines through an IV tube. These may be given for about 2 weeks. At first, the antibiotic that is given may kill most types of blood bacteria. If your test results show that a certain kind of bacteria is causing problems, the antibiotic may be changed to kill only the bacteria that are causing problems. °· Antibiotics taken by mouth. °· Removing any catheter or IV tube that is a source of infection. °· Blood pressure and breathing support, if needed. °· Surgery to control the source or spread of infection, if needed. °HOME CARE INSTRUCTIONS °· Take over-the-counter and prescription medicines only as told by your health care provider. °· If you were prescribed an antibiotic, take it as told by your health care provider. Do not stop taking the antibiotic even if you start to feel better. °· Rest at home until your condition is under control. °· Drink enough fluid to keep your urine clear or pale yellow. °· Keep all follow-up visits as told by your health care provider. This is important. °PREVENTION °Take these actions to help prevent future episodes of bacteremia: °· Get all vaccinations as recommended by your health care provider. °· Clean and cover scrapes or cuts. °· Bathe regularly. °· Wash your hands often. °· Before any dental or surgical procedure, ask your health care provider if you should take an antibiotic. °SEEK MEDICAL   CARE IF: °· Your symptoms get worse. °· You continue to have symptoms after treatment. °· You develop new symptoms after treatment. °SEEK IMMEDIATE MEDICAL CARE IF: °· You have chest pain or trouble breathing. °· You develop confusion, dizziness, or weakness. °· You develop pale skin. °  °This information is not intended to replace advice given to you by your health care provider. Make sure you discuss any questions you have  with your health care provider. °  °Document Released: 09/17/2006 Document Revised: 08/25/2015 Document Reviewed: 02/06/2015 °Elsevier Interactive Patient Education ©2016 [employer] ° °

## 2015-10-04 NOTE — Plan of Care (Signed)
Problem: Consults Goal: Heart Failure Patient Education (See Patient Education module for education specifics.) Outcome: Adequate for Discharge Pt is forgetful and requires constant reminders.

## 2015-10-04 NOTE — Progress Notes (Signed)
Unfortunately, I was unable to meet with Mr. Quattrone POA Nellie Chevalier) in person during this admission.  I did call and talk with her about his clinical course and recommended that she continue discussion with the physician that sees him at the assisted living facility.  She seems to have a good understanding of his current clinical condition as well as aspiration pneumonia and the fact this is going to be a continuous problem. She states that she had been having conversations about his goals of care with other physicians and she has completed a MOST form in the past, She appropriately describes it as a bright pink form and is able to relay some of the contents, such as limited additional interventions.  It is not located on his chart, nor is the durable DO NOT RESUSCITATE she completed in the past.  She requests that I send another blank MOST form with him so that is something she can complete with the physician at the assisted living facility. She asked for completion of another durable DO NOT RESUSCITATE to go back to the facility with him.  I sent these forms with him for him to complete with physician or another provider at assisted living facility per his [facility] request.  Micheline Rough, MD [place] [facility] [phone]

## 2015-10-04 NOTE — Progress Notes (Signed)
All property returned to patient including his watch and dentures.

## 2015-10-04 NOTE — Progress Notes (Signed)
SLP Cancellation Note  Patient Details Name: JOSEP LUVIANO MRN: [id number] DOB: 1935/08/02   Cancelled treatment:       Reason Eval/Treat Not Completed: Other (comment) (pt dc'd back to [facility] today per note from RN )  Please order follow up as indicated.   Luanna Salk, [place] [facility] SLP [phone]

## 2015-10-04 NOTE — Progress Notes (Signed)
Patient for d/c today back  to [facility]. Family and patient agreeable to this plan- plans also confirmed with ALF rep. Plan transfer via EMS. Eduard Clos, MSW, [place]

## 2015-10-04 NOTE — Progress Notes (Signed)
Pt will be discharged back to [facility] today. Pt awake, calm and cooperative. Still has a productive cough but is able to expectorate secretions. No difficulty swallowing today. Pt spoke with ex wife on the phone who is in agreement with plans.

## 2015-10-05 ENCOUNTER — Observation Stay (HOSPITAL_COMMUNITY)
Admission: EM | Admit: 2015-10-05 | Discharge: 2015-10-07 | Disposition: A | Discharge status: Skilled Nursing Facility | Payer: Medicare Other | Attending: Internal Medicine | Admitting: Internal Medicine

## 2015-10-05 ENCOUNTER — Emergency Department (HOSPITAL_COMMUNITY): Payer: Medicare Other

## 2015-10-05 ENCOUNTER — Encounter (HOSPITAL_COMMUNITY): Payer: Self-pay | Admitting: Emergency Medicine

## 2015-10-05 DIAGNOSIS — Z96649 Presence of unspecified artificial hip joint: Secondary | ICD-10-CM | POA: Diagnosis not present

## 2015-10-05 DIAGNOSIS — Z7189 Other specified counseling: Secondary | ICD-10-CM

## 2015-10-05 DIAGNOSIS — E119 Type 2 diabetes mellitus without complications: Secondary | ICD-10-CM | POA: Diagnosis not present

## 2015-10-05 DIAGNOSIS — F319 Bipolar disorder, unspecified: Secondary | ICD-10-CM | POA: Insufficient documentation

## 2015-10-05 DIAGNOSIS — R778 Other specified abnormalities of plasma proteins: Secondary | ICD-10-CM | POA: Diagnosis present

## 2015-10-05 DIAGNOSIS — R7989 Other specified abnormal findings of blood chemistry: Secondary | ICD-10-CM | POA: Diagnosis not present

## 2015-10-05 DIAGNOSIS — N390 Urinary tract infection, site not specified: Secondary | ICD-10-CM | POA: Diagnosis not present

## 2015-10-05 DIAGNOSIS — R748 Abnormal levels of other serum enzymes: Secondary | ICD-10-CM | POA: Insufficient documentation

## 2015-10-05 DIAGNOSIS — M1990 Unspecified osteoarthritis, unspecified site: Secondary | ICD-10-CM | POA: Diagnosis not present

## 2015-10-05 DIAGNOSIS — Z7952 Long term (current) use of systemic steroids: Secondary | ICD-10-CM | POA: Insufficient documentation

## 2015-10-05 DIAGNOSIS — G20 Parkinson's disease: Secondary | ICD-10-CM | POA: Diagnosis not present

## 2015-10-05 DIAGNOSIS — I5032 Chronic diastolic (congestive) heart failure: Secondary | ICD-10-CM | POA: Diagnosis not present

## 2015-10-05 DIAGNOSIS — F0390 Unspecified dementia without behavioral disturbance: Secondary | ICD-10-CM | POA: Diagnosis not present

## 2015-10-05 DIAGNOSIS — I10 Essential (primary) hypertension: Secondary | ICD-10-CM | POA: Diagnosis present

## 2015-10-05 DIAGNOSIS — I110 Hypertensive heart disease with heart failure: Secondary | ICD-10-CM | POA: Diagnosis not present

## 2015-10-05 DIAGNOSIS — Z7982 Long term (current) use of aspirin: Secondary | ICD-10-CM | POA: Diagnosis not present

## 2015-10-05 DIAGNOSIS — G20A1 Parkinson's disease without dyskinesia, without mention of fluctuations: Secondary | ICD-10-CM | POA: Diagnosis present

## 2015-10-05 DIAGNOSIS — Z79899 Other long term (current) drug therapy: Secondary | ICD-10-CM | POA: Insufficient documentation

## 2015-10-05 DIAGNOSIS — N400 Enlarged prostate without lower urinary tract symptoms: Secondary | ICD-10-CM | POA: Diagnosis not present

## 2015-10-05 DIAGNOSIS — J189 Pneumonia, unspecified organism: Secondary | ICD-10-CM | POA: Diagnosis present

## 2015-10-05 DIAGNOSIS — R4182 Altered mental status, unspecified: Secondary | ICD-10-CM | POA: Diagnosis present

## 2015-10-05 LAB — URINALYSIS, ROUTINE W REFLEX MICROSCOPIC
BILIRUBIN URINE: NEGATIVE
Glucose, UA: NEGATIVE mg/dL
Hgb urine dipstick: NEGATIVE
KETONES UR: NEGATIVE mg/dL
NITRITE: POSITIVE — AB
PROTEIN: 30 mg/dL — AB
SPECIFIC GRAVITY, URINE: 1.02 (ref 1.005–1.030)
UROBILINOGEN UA: 0.2 mg/dL (ref 0.0–1.0)
pH: 8.5 — ABNORMAL HIGH (ref 5.0–8.0)

## 2015-10-05 LAB — URINE MICROSCOPIC-ADD ON

## 2015-10-05 LAB — I-STAT TROPONIN, ED: TROPONIN I, POC: 0.28 ng/mL — AB (ref 0.00–0.08)

## 2015-10-05 LAB — COMPREHENSIVE METABOLIC PANEL
ALBUMIN: 3 g/dL — AB (ref 3.5–5.0)
ALK PHOS: 78 U/L (ref 38–126)
ALT: 8 U/L — ABNORMAL LOW (ref 17–63)
ANION GAP: 7 (ref 5–15)
AST: 85 U/L — ABNORMAL HIGH (ref 15–41)
BILIRUBIN TOTAL: 0.6 mg/dL (ref 0.3–1.2)
BUN: 35 mg/dL — ABNORMAL HIGH (ref 6–20)
CALCIUM: 8.6 mg/dL — AB (ref 8.9–10.3)
CO2: 27 mmol/L (ref 22–32)
Chloride: 109 mmol/L (ref 101–111)
Creatinine, Ser: 1.16 mg/dL (ref 0.61–1.24)
GFR calc non Af Amer: 58 mL/min — ABNORMAL LOW (ref >60)
GLUCOSE: 111 mg/dL — AB (ref 65–99)
Potassium: 4 mmol/L (ref 3.5–5.1)
Sodium: 143 mmol/L (ref 135–145)
TOTAL PROTEIN: 5.5 g/dL — AB (ref 6.5–8.1)

## 2015-10-05 LAB — CBC WITH DIFFERENTIAL/PLATELET
BASOS ABS: 0 10*3/uL (ref 0.0–0.1)
BASOS PCT: 0 %
EOS ABS: 0.2 10*3/uL (ref 0.0–0.7)
Eosinophils Relative: 2 %
HEMATOCRIT: 33.9 % — AB (ref 39.0–52.0)
Hemoglobin: 10.9 g/dL — ABNORMAL LOW (ref 13.0–17.0)
Lymphocytes Relative: 12 %
Lymphs Abs: 1.4 10*3/uL (ref 0.7–4.0)
MCH: 28.7 pg (ref 26.0–34.0)
MCHC: 32.2 g/dL (ref 30.0–36.0)
MCV: 89.2 fL (ref 78.0–100.0)
MONO ABS: 0.8 10*3/uL (ref 0.1–1.0)
Monocytes Relative: 7 %
NEUTROS ABS: 9.3 10*3/uL — AB (ref 1.7–7.7)
Neutrophils Relative %: 79 %
PLATELETS: 218 10*3/uL (ref 150–400)
RBC: 3.8 MIL/uL — ABNORMAL LOW (ref 4.22–5.81)
RDW: 15.7 % — AB (ref 11.5–15.5)
WBC: 11.7 10*3/uL — ABNORMAL HIGH (ref 4.0–10.5)

## 2015-10-05 LAB — BRAIN NATRIURETIC PEPTIDE: B Natriuretic Peptide: 376.6 pg/mL — ABNORMAL HIGH (ref 0.0–100.0)

## 2015-10-05 LAB — TROPONIN I: Troponin I: 0.25 ng/mL — ABNORMAL HIGH (ref <0.031)

## 2015-10-05 LAB — I-STAT CG4 LACTIC ACID, ED: LACTIC ACID, VENOUS: 0.98 mmol/L (ref 0.5–2.0)

## 2015-10-05 LAB — GLUCOSE, CAPILLARY: Glucose-Capillary: 103 mg/dL — ABNORMAL HIGH (ref 65–99)

## 2015-10-05 MED ORDER — QUETIAPINE FUMARATE 25 MG PO TABS
75.0000 mg | ORAL_TABLET | Freq: Every day | ORAL | Status: DC
  Administered 2015-10-05 – 2015-10-06 (×2): 75 mg via ORAL
  Filled 2015-10-05 (×2): qty 3

## 2015-10-05 MED ORDER — TRAZODONE HCL 50 MG PO TABS
100.0000 mg | ORAL_TABLET | Freq: Every day | ORAL | Status: DC
  Administered 2015-10-05 – 2015-10-06 (×2): 100 mg via ORAL
  Filled 2015-10-05 (×2): qty 2

## 2015-10-05 MED ORDER — HYDRALAZINE HCL 25 MG PO TABS
25.0000 mg | ORAL_TABLET | Freq: Two times a day (BID) | ORAL | Status: DC
  Administered 2015-10-05 – 2015-10-07 (×4): 25 mg via ORAL
  Filled 2015-10-05 (×4): qty 1

## 2015-10-05 MED ORDER — GLUCERNA SHAKE PO LIQD
237.0000 mL | Freq: Every day | ORAL | Status: DC
Start: 2015-10-05 — End: 2015-10-07
  Administered 2015-10-07: 237 mL via ORAL
  Filled 2015-10-05 (×3): qty 237

## 2015-10-05 MED ORDER — SERTRALINE HCL 100 MG PO TABS
200.0000 mg | ORAL_TABLET | Freq: Every day | ORAL | Status: DC
  Administered 2015-10-05 – 2015-10-07 (×3): 200 mg via ORAL
  Filled 2015-10-05 (×4): qty 2

## 2015-10-05 MED ORDER — LORATADINE 10 MG PO TABS: 10.0000 mg | ORAL_TABLET | Freq: Every day | ORAL | Status: DC | PRN

## 2015-10-05 MED ORDER — CLONAZEPAM 0.5 MG PO TABS
0.2500 mg | ORAL_TABLET | Freq: Every day | ORAL | Status: DC
  Administered 2015-10-06 – 2015-10-07 (×2): 0.25 mg via ORAL
  Filled 2015-10-05 (×2): qty 1

## 2015-10-05 MED ORDER — LISINOPRIL 10 MG PO TABS
10.0000 mg | ORAL_TABLET | Freq: Every day | ORAL | Status: DC
  Administered 2015-10-06 – 2015-10-07 (×2): 10 mg via ORAL
  Filled 2015-10-05 (×3): qty 1

## 2015-10-05 MED ORDER — PREDNISONE 5 MG PO TABS
2.5000 mg | ORAL_TABLET | Freq: Every day | ORAL | Status: DC
Start: 2015-10-06 — End: 2015-10-07
  Administered 2015-10-06 – 2015-10-07 (×2): 2.5 mg via ORAL
  Filled 2015-10-05 (×2): qty 1

## 2015-10-05 MED ORDER — AMOXICILLIN-POT CLAVULANATE 875-125 MG PO TABS
1.0000 | ORAL_TABLET | Freq: Two times a day (BID) | ORAL | Status: DC
  Administered 2015-10-05 – 2015-10-07 (×4): 1 via ORAL
  Filled 2015-10-05 (×4): qty 1

## 2015-10-05 MED ORDER — FAMOTIDINE 20 MG PO TABS
20.0000 mg | ORAL_TABLET | Freq: Two times a day (BID) | ORAL | Status: DC
  Administered 2015-10-05 – 2015-10-07 (×4): 20 mg via ORAL
  Filled 2015-10-05 (×4): qty 1

## 2015-10-05 MED ORDER — IPRATROPIUM-ALBUTEROL 0.5-2.5 (3) MG/3ML IN SOLN: 3.0000 mL | Freq: Three times a day (TID) | RESPIRATORY_TRACT | Status: DC

## 2015-10-05 MED ORDER — SODIUM CHLORIDE 0.9 % IV BOLUS (SEPSIS)
1000.0000 mL | Freq: Once | INTRAVENOUS | Status: AC
  Administered 2015-10-05: 1000 mL via INTRAVENOUS

## 2015-10-05 MED ORDER — INSULIN ASPART 100 UNIT/ML ~~LOC~~ SOLN
0.0000 [IU] | Freq: Three times a day (TID) | SUBCUTANEOUS | Status: DC
  Administered 2015-10-06: 1 [IU] via SUBCUTANEOUS

## 2015-10-05 MED ORDER — ENTACAPONE 200 MG PO TABS
200.0000 mg | ORAL_TABLET | Freq: Three times a day (TID) | ORAL | Status: DC
  Administered 2015-10-05 – 2015-10-07 (×5): 200 mg via ORAL
  Filled 2015-10-05 (×7): qty 1

## 2015-10-05 MED ORDER — ALBUTEROL SULFATE (2.5 MG/3ML) 0.083% IN NEBU: 2.5000 mg | INHALATION_SOLUTION | RESPIRATORY_TRACT | Status: DC | PRN

## 2015-10-05 MED ORDER — ASPIRIN 81 MG PO CHEW
324.0000 mg | CHEWABLE_TABLET | Freq: Once | ORAL | Status: AC
  Administered 2015-10-05: 324 mg via ORAL
  Filled 2015-10-05: qty 4

## 2015-10-05 MED ORDER — DEXTROSE 5 % IV SOLN
1.0000 g | Freq: Once | INTRAVENOUS | Status: AC
  Administered 2015-10-05: 1 g via INTRAVENOUS
  Filled 2015-10-05: qty 10

## 2015-10-05 MED ORDER — LAMOTRIGINE 100 MG PO TABS
150.0000 mg | ORAL_TABLET | Freq: Two times a day (BID) | ORAL | Status: DC
  Administered 2015-10-05 – 2015-10-07 (×4): 150 mg via ORAL
  Filled 2015-10-05 (×4): qty 2

## 2015-10-05 MED ORDER — SODIUM CHLORIDE 0.9 % IJ SOLN
3.0000 mL | Freq: Two times a day (BID) | INTRAMUSCULAR | Status: DC
  Administered 2015-10-06 – 2015-10-07 (×3): 3 mL via INTRAVENOUS

## 2015-10-05 MED ORDER — CLONAZEPAM 0.5 MG PO TABS
0.5000 mg | ORAL_TABLET | Freq: Every day | ORAL | Status: DC
  Administered 2015-10-05 – 2015-10-06 (×2): 0.5 mg via ORAL
  Filled 2015-10-05 (×2): qty 1

## 2015-10-05 MED ORDER — CARBIDOPA-LEVODOPA 25-250 MG PO TABS
1.0000 | ORAL_TABLET | Freq: Three times a day (TID) | ORAL | Status: DC
Start: 2015-10-05 — End: 2015-10-07
  Administered 2015-10-05 – 2015-10-07 (×5): 1 via ORAL
  Filled 2015-10-05 (×7): qty 1

## 2015-10-05 MED ORDER — ENOXAPARIN SODIUM 30 MG/0.3ML ~~LOC~~ SOLN
30.0000 mg | Freq: Every day | SUBCUTANEOUS | Status: DC
  Administered 2015-10-05: 30 mg via SUBCUTANEOUS
  Filled 2015-10-05: qty 0.3

## 2015-10-05 NOTE — ED Provider Notes (Addendum)
CSN: [id number]     Arrival date & time 10/05/15  1330 History   First MD Initiated Contact with Patient 10/05/15 1458     Chief Complaint  Patient presents with  . Altered Mental Status     (Consider location/radiation/quality/duration/timing/severity/associated sxs/prior Treatment) HPI  80 year old male sent from his facility for altered mental status. He was discharged yesterday for pneumonia and possible UTI and is currently on antibiotics. Discharge summary notes the patient was still confused upon discharge. It is unclear exactly what new mental status changes are concerning to the facility. The patient did apparently fall yesterday although he does not remember this. Ever patient does specifically remember without asking that he was discharged from hospital yesterday for pneumonia. He is unclear why they sent him back as he does not feel worse. He states his cough is about the same as when he is discharged. Denies any fevers. He is alert to self, place, and situation. Disoriented to time although he does know who the president is and that in collection is upcoming in one month as well as to the candidates are.  I did talk to the NH staff and they state patient was confused on arrival yesterday but has worsened. [place] out of his bed yesterday (unwitnessed) and crawled under his bed and was smearing feces "everywhere". He was more confused progressively to the point he couldn't make a conversation with the NH doctor. Has acted like this before with UTIs. Has not had fevers or complaints of chest pain.  Past Medical History  Diagnosis Date  . Hearing aid worn   . Hypertension   . Parkinson disease ([place])   . Pneumonia   . Diabetes mellitus type II   . Arthritis   . Bipolar disorder ([place])   . Anxiety   . Depression   . MRSA infection (methicillin-resistant Staphylococcus aureus)   . Chronic indwelling Foley catheter   . C2 cervical fracture ([name])     due to pt fall  . SIRS (systemic  inflammatory response syndrome) ([name])   . Memory loss   . Benign prostate hyperplasia   . Memory difficulty 08/21/2014  . Neuromuscular disorder ([place])     parkinsons  . Flu   . Falls   . UTI (urinary tract infection)    Past Surgical History  Procedure Laterality Date  . Total hip arthroplasty    . Video bronchoscopy  12/16/2012    Procedure: VIDEO BRONCHOSCOPY;  Surgeon: Melrose Nakayama, MD;  Location: [facility];  Service: Thoracic;  Laterality: Right;  . Video assisted thoracoscopy (vats)/empyema  12/16/2012    Procedure: VIDEO ASSISTED THORACOSCOPY (VATS)/EMPYEMA;  Surgeon: Melrose Nakayama, MD;  Location: [facility];  Service: Thoracic;  Laterality: Right;   Family History  Problem Relation Age of Onset  . Depression Father   . Cancer Mother   . Heart disease Mother    Social History  Substance Use Topics  . Smoking status: Never Smoker   . Smokeless tobacco: Never Used  . Alcohol Use: No     Comment: occasional    Review of Systems  Unable to perform ROS: Mental status change      Allergies  Cholestatin  Home Medications   Prior to Admission medications   Medication Sig Start Date End Date Taking? Authorizing Provider  acetaminophen (TYLENOL) 325 MG tablet Take 650 mg by mouth every 6 (six) hours as needed for mild pain.    Historical Provider, MD  albuterol (PROVENTIL) (2.5 MG/3ML) 0.083%  nebulizer solution Take 3 mLs (2.5 mg total) by nebulization every 2 (two) hours as needed for wheezing or shortness of breath. Patient not taking: Reported on 08/25/2015 04/21/14   Hosie Poisson, MD  amLODipine (NORVASC) 5 MG tablet Take 1 tablet (5 mg total) by mouth daily. 02/03/13   Janece Canterbury, MD  amoxicillin-clavulanate (AUGMENTIN) 500-125 MG tablet Take 1 tablet (500 mg total) by mouth every 12 (twelve) hours. 10/04/15   Theodis Blaze, MD  aspirin 81 MG chewable tablet Chew 81 mg by mouth daily.    Historical Provider, MD  bisacodyl (DULCOLAX) 10 MG suppository Place 10  mg rectally daily as needed for moderate constipation.    Historical Provider, MD  carbidopa-levodopa (SINEMET IR) 25-250 MG per tablet Take 1 tablet by mouth 3 (three) times daily.    Historical Provider, MD  clonazePAM (KLONOPIN) 0.5 MG tablet Take 0.25-0.5 mg by mouth 2 (two) times daily. Takes 1/2 tablet at noon and 1 tablet at bedtime    Historical Provider, MD  entacapone (COMTAN) 200 MG tablet Take 200 mg by mouth 3 (three) times daily.    Historical Provider, MD  famotidine (PEPCID) 20 MG tablet Take 20 mg by mouth 2 (two) times daily.    Historical Provider, MD  GLUCERNA (GLUCERNA) LIQD Take 237 mLs by mouth daily.     Historical Provider, MD  hydrALAZINE (APRESOLINE) 25 MG tablet Take 25 mg by mouth 2 (two) times daily.     Historical Provider, MD  ipratropium-albuterol (DUONEB) 0.5-2.5 (3) MG/3ML SOLN Take 3 mLs by nebulization 3 (three) times daily. Patient not taking: Reported on 08/25/2015 04/21/14   Hosie Poisson, MD  lamoTRIgine (LAMICTAL) 150 MG tablet Take 1 tablet (150 mg total) by mouth 2 (two) times daily. 12/02/14   Kathlee Nations, MD  memantine (NAMENDA TITRATION PAK) tablet pack 5 mg/day for =1 week; 5 mg twice daily for =1 week; 15 mg/day given in 5 mg and 10 mg separated doses for =1 week; then 10 mg twice daily Patient not taking: Reported on 08/25/2015 05/31/15   Kathrynn Ducking, MD  Multiple Vitamin (MULTIVITAMIN WITH MINERALS) TABS tablet Take 1 tablet by mouth daily.    Historical Provider, MD  predniSONE (DELTASONE) 2.5 MG tablet Take 2.5 mg by mouth daily with breakfast.    Historical Provider, MD  QUEtiapine (SEROQUEL) 25 MG tablet Take 75 mg by mouth at bedtime.    Historical Provider, MD  sertraline (ZOLOFT) 100 MG tablet Take 2 tablets (200 mg total) by mouth daily. 10/01/14   Kathlee Nations, MD  Skin Protectants, Misc. (DIMETHICONE-ZINC OXIDE) cream Apply topically 2 (two) times daily as needed for dry skin.    Historical Provider, MD  traZODone (DESYREL) 100 MG tablet  Take 1 tablet (100 mg total) by mouth at bedtime. 12/02/14   Kathlee Nations, MD   BP 95/54 mmHg  Pulse 69  Temp(Src) 98.3 F (36.8 C) (Oral)  Resp 16  SpO2 92% Physical Exam  Constitutional: He appears well-developed and well-nourished.  HENT:  Head: Normocephalic and atraumatic.  Right Ear: External ear normal.  Left Ear: External ear normal.  Nose: Nose normal.  Eyes: Right eye exhibits no discharge. Left eye exhibits no discharge.  Neck: Neck supple.  Cardiovascular: Normal rate, regular rhythm, normal heart sounds and intact distal pulses.   Pulmonary/Chest: Effort normal and breath sounds normal.  Abdominal: Soft. He exhibits no distension. There is no tenderness.  Musculoskeletal: He exhibits no edema.  Neurological: He  is alert. He is disoriented.  Alert and oriented to self and place and situation, disoriented to time. CN 2-12 grossly intact. 5/5 strength in all 4 extremities.   Skin: Skin is warm and dry.  Nursing note and vitals reviewed.   ED Course  Procedures (including critical care time) Labs Review Labs Reviewed  COMPREHENSIVE METABOLIC PANEL - Abnormal; Notable for the following:    Glucose, Bld 111 (*)    BUN 35 (*)    Calcium 8.6 (*)    Total Protein 5.5 (*)    Albumin 3.0 (*)    AST 85 (*)    ALT 8 (*)    GFR calc non Af Amer 58 (*)    All other components within normal limits  CBC WITH DIFFERENTIAL/PLATELET - Abnormal; Notable for the following:    WBC 11.7 (*)    RBC 3.80 (*)    Hemoglobin 10.9 (*)    HCT 33.9 (*)    RDW 15.7 (*)    Neutro Abs 9.3 (*)    All other components within normal limits  URINALYSIS, ROUTINE W REFLEX MICROSCOPIC (NOT AT [facility]) - Abnormal; Notable for the following:    Color, Urine AMBER (*)    APPearance TURBID (*)    pH 8.5 (*)    Protein, ur 30 (*)    Nitrite POSITIVE (*)    Leukocytes, UA MODERATE (*)    All other components within normal limits  BRAIN NATRIURETIC PEPTIDE - Abnormal; Notable for the following:     B Natriuretic Peptide 376.6 (*)    All other components within normal limits  URINE MICROSCOPIC-ADD ON - Abnormal; Notable for the following:    Bacteria, UA MANY (*)    Crystals TRIPLE PHOSPHATE CRYSTALS (*)    All other components within normal limits  TROPONIN I - Abnormal; Notable for the following:    Troponin I 0.25 (*)    All other components within normal limits  I-STAT TROPOININ, ED - Abnormal; Notable for the following:    Troponin i, poc 0.28 (*)    All other components within normal limits  URINE CULTURE  TROPONIN I  TROPONIN I  I-STAT CG4 LACTIC ACID, ED    Imaging Review Dg Chest 2 View  10/05/2015  CLINICAL DATA:  Pneumonia. Fall yesterday. Altered mental status. Parkinson' s disease. EXAM: CHEST  2 VIEW COMPARISON:  09/30/2015 FINDINGS: Mild enlargement of the cardiopericardial silhouette with bandlike opacity above the knee left hemidiaphragm and also just above the right minor fissure. Mild indistinctness of pulmonary vasculature. Atherosclerotic calcification of the aortic arch. Severe degenerative glenohumeral arthropathy bilaterally with associated bilateral humeral head volume loss. Probable free fragments in the left glenohumeral joint. Left rib deformities, chronic and stable, from healed fractures. Low lung volumes are present, causing crowding of the pulmonary vasculature. No pleural effusion. Lumbar spondylosis. IMPRESSION: 1. Bandlike opacities at the left lung base and right mid lung, probably from atelectasis given their configuration. Bilateral perihilar interstitial accentuation and airway thickening could be from atypical pneumonia or mild interstitial edema. 2. Mild enlargement of the cardiopericardial silhouette 3. Severe bilateral degenerative glenohumeral arthropathy. Electronically Signed   By: Van Clines M.D.   On: 10/05/2015 15:58   Ct Head Wo Contrast  10/05/2015  CLINICAL DATA:  Altered mental status.  Recent pneumonia and fall. EXAM: CT  HEAD WITHOUT CONTRAST TECHNIQUE: Contiguous axial images were obtained from the base of the skull through the vertex without intravenous contrast. COMPARISON:  08/25/2015 head CT. FINDINGS:  No evidence of parenchymal hemorrhage or extra-axial fluid collection. No mass lesion, mass effect, or midline shift. No CT evidence of acute infarction. Re- demonstrated is intracranial atherosclerotic arterial calcification and moderate diffuse cerebral volume loss. Nonspecific stable mild subcortical and periventricular white matter hypodensity, most in keeping with chronic small vessel ischemic change. Cerebral ventricle sizes are stable and concordant with the degree of cerebral volume loss. 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 There is mild partial opacification of the bilateral ethmoidal air cells. No fluid levels in the visualized paranasal sinuses. The mastoid air cells are unopacified. No evidence of calvarial fracture. IMPRESSION: 1.  No evidence of acute intracranial abnormality. 2. Stable intracranial atherosclerosis, diffuse cerebral volume loss and mild chronic small vessel ischemic white matter change. Electronically Signed   By: Ilona Sorrel M.D.   On: 10/05/2015 16:37   I have personally reviewed and evaluated these images and lab results as part of my medical decision-making.   EKG Interpretation   Date/Time:  Tuesday October 05 2015 15:17:29 EDT Ventricular Rate:  70 PR Interval:  169 QRS Duration: 105 QT Interval:  563 QTC Calculation: 608 R Axis:   -48 Text Interpretation:  Normal sinus rhythm no acute ST/T changes Confirmed  by Regenia Skeeter  MD, Napolean Sia ([id number]) on 10/05/2015 3:20:49 PM       EKG Interpretation  Date/Time:  Tuesday October 05 2015 16:05:58 EDT Ventricular Rate:  66 PR Interval:  165 QRS Duration: 102 QT Interval:  537 QTC Calculation: 563 R Axis:   -43 Text Interpretation:  Sinus rhythm Left anterior fascicular block Anteroseptal infarct, age indeterminate Nonspecific T  abnormalities, lateral leads Prolonged QT interval T wave abnormalities unchanged from earlier in the day Confirmed by Kesley Gaffey  MD, Ameira Alessandrini ([id number]) on 10/05/2015 4:12:11 PM       MDM   Final diagnoses:  Elevated troponin    It is unclear why the patient's troponin is elevated. He has some new T wave inversions compared to earlier in the month but no symptoms. Specifically denies dyspnea or CP. Thus ACS seems unlikely without any symptoms at all. Is mildly confused here, not like he was at the [facility]. Workup otherwise unremarkable. Doubt PE with no tachycardia, hypoxia or trouble breathing. D/w Dr. Acie Fredrickson of cardiology who recommends cycling troponins and cards consult if medicine wants. Hospitalist to admit to telemetry.    Sherwood Gambler, MD 10/05/15 2025  Sherwood Gambler, MD 10/05/15 2026

## 2015-10-05 NOTE — ED Notes (Signed)
Patient transported to CT 

## 2015-10-05 NOTE — ED Notes (Signed)
MD at bedside. 

## 2015-10-05 NOTE — ED Notes (Signed)
Bed: ZG43 Expected date:  Expected time:  Means of arrival:  Comments: EMS- 80 AMS, FALL YESTERDAY

## 2015-10-05 NOTE — ED Notes (Addendum)
Mini lab report given to [facility]

## 2015-10-05 NOTE — Progress Notes (Signed)
Our team received a call from Mr. Teale POA, [name], stating that she was in the ED with him today.  We had previously discussed advance care planning on the phone and I recommended that they complete a MOST form.  She reports that she had done this in the past.  She called to see if we could sign another form while she is here in the ED to ensure that he has an up-to-date form as it appears that the last one she completed has been lost.  When I met with her, she reports this is the same form that she completed. She asked to complete another form today.   We completed MOST form today. DNR, Limited additional interventions, IVF and ABX if indicated, no feeding tube.  Micheline Rough, MD [facility] [phone]

## 2015-10-05 NOTE — Progress Notes (Signed)
Upon reviewing orders, this RN noticed that the patient had Heart Healthy diet ordered. In his previous admission (DCed 10/17) he was on a Dysphagia I Puree (Honey Thick) d/t some trouble swallowing per SLP. Paged hospitalist and made oncoming RN aware.

## 2015-10-05 NOTE — ED Notes (Signed)
Pt from [facility]. Pt recently dx with PNA and discharged from the hospital. Pt had a fall yesterday. Per nursing home note pt has had altered mental status since returning from the hospital yesterday. Normally alert and oriented 4x. Could not answer birthdate or time at facility. Pt said it was March during triage, but otherwise answered questions correctly and knew the president. Facility sent pt here for evaluation. Also want pt checked for UTI.

## 2015-10-05 NOTE — ED Notes (Signed)
POA [name] left phone number [phone].

## 2015-10-05 NOTE — H&P (Addendum)
[facility] History and Physical  [name] ZOX:[id number] DOB: [birth year] DOA: 10/05/2015  Referring physician: Dr Regenia Skeeter PCP: No primary care provider on file.   Chief Complaint: sent back from NH for increased confusion   HPI: [name] is a 80 y.o. male with prior h/o hypertension, parkinson disease, DM, recently diag health care associated pneumonia, discharged yesterday from medical service to SNF was brought to ED for increased confusion. He underwent routine lab work and he was found to have mild leukocytosis, normal lactic acid, abnormal UA, and elevated troponin. On further questioning the patient he is alert and oriented to place , person and year and denies any other complaints.  He was referred to medical service for admission for abnormal troponin and new EKG changes.  Cardiology consulted by EDP recommended observation, cycle troponins and an echocardiogram in am.    Review of Systems:  Constitutional:  No weight loss, night sweats, Fevers, chills, fatigue.  HEENT:  No headaches, Difficulty swallowing,Tooth/dental problems,Sore throat,  No sneezing, itching, ear ache, nasal congestion, post nasal drip,  Cardio-vascular:  No chest pain, Orthopnea, PND, swelling in lower extremities, anasarca, dizziness, palpitations  GI:  No heartburn, indigestion, abdominal pain, nausea, vomiting, diarrhea, change in bowel habits, loss of appetite  Resp:  No shortness of breath with exertion or at rest. No excess mucus, no productive cough, No non-productive cough, No coughing up of blood.No change in color of mucus.No wheezing.No chest wall deformity  Skin:  no rash or lesions.  GU:  no dysuria, change in color of urine, no urgency or frequency. No flank pain.  Musculoskeletal:  No joint pain or swelling. No decreased range of motion. No back pain.  Psych:  No change in mood or affect. No depression or anxiety. No memory loss.   Past Medical History  Diagnosis  Date  . Hearing aid worn   . Hypertension   . Parkinson disease ([place])   . Pneumonia   . Diabetes mellitus type II   . Arthritis   . Bipolar disorder ([place])   . Anxiety   . Depression   . MRSA infection (methicillin-resistant Staphylococcus aureus)   . Chronic indwelling Foley catheter   . C2 cervical fracture ([name])     due to pt fall  . SIRS (systemic inflammatory response syndrome) ([name])   . Memory loss   . Benign prostate hyperplasia   . Memory difficulty 08/21/2014  . Neuromuscular disorder ([place])     parkinsons  . Flu   . Falls   . UTI (urinary tract infection)    Past Surgical History  Procedure Laterality Date  . Total hip arthroplasty    . Video bronchoscopy  12/16/2012    Procedure: VIDEO BRONCHOSCOPY;  Surgeon: Melrose Nakayama, MD;  Location: [place];  Service: Thoracic;  Laterality: Right;  . Video assisted thoracoscopy (vats)/empyema  12/16/2012    Procedure: VIDEO ASSISTED THORACOSCOPY (VATS)/EMPYEMA;  Surgeon: Melrose Nakayama, MD;  Location: [place];  Service: Thoracic;  Laterality: Right;   Social History:  reports that he has never smoked. He has never used smokeless tobacco. He reports that he does not drink alcohol or use illicit drugs.  Allergies  Allergen Reactions  . Cholestatin Other (See Comments)    Per MAR    Family History  Problem Relation Age of Onset  . Depression Father   . Cancer Mother   . Heart disease Mother     Prior to Admission medications   Medication Sig  Start Date End Date Taking? Authorizing Provider  acetaminophen (TYLENOL) 325 MG tablet Take 650 mg by mouth every 6 (six) hours as needed for mild pain.   Yes Historical Provider, MD  albuterol (PROVENTIL) (2.5 MG/3ML) 0.083% nebulizer solution Take 3 mLs (2.5 mg total) by nebulization every 2 (two) hours as needed for wheezing or shortness of breath. 04/21/14  Yes Hosie Poisson, MD  amLODipine (NORVASC) 5 MG tablet Take 1 tablet (5 mg total) by mouth daily. 02/03/13  Yes  Janece Canterbury, MD  aspirin 81 MG chewable tablet Chew 81 mg by mouth daily.   Yes Historical Provider, MD  bisacodyl (DULCOLAX) 10 MG suppository Place 10 mg rectally daily as needed for moderate constipation.   Yes Historical Provider, MD  carbidopa-levodopa (SINEMET IR) 25-250 MG per tablet Take 1 tablet by mouth 3 (three) times daily.   Yes Historical Provider, MD  clonazePAM (KLONOPIN) 0.5 MG tablet Take 0.25-0.5 mg by mouth 2 (two) times daily. Take 0.25mg  at noon and 0.5mg  at bedtime   Yes Historical Provider, MD  entacapone (COMTAN) 200 MG tablet Take 200 mg by mouth 3 (three) times daily.   Yes Historical Provider, MD  famotidine (PEPCID) 20 MG tablet Take 20 mg by mouth 2 (two) times daily.   Yes Historical Provider, MD  GLUCERNA (GLUCERNA) LIQD Take 237 mLs by mouth daily.    Yes Historical Provider, MD  hydrALAZINE (APRESOLINE) 25 MG tablet Take 25 mg by mouth 2 (two) times daily.    Yes Historical Provider, MD  lamoTRIgine (LAMICTAL) 150 MG tablet Take 1 tablet (150 mg total) by mouth 2 (two) times daily. 12/02/14  Yes Kathlee Nations, MD  lisinopril (PRINIVIL,ZESTRIL) 10 MG tablet Take 10 mg by mouth daily.   Yes Historical Provider, MD  loratadine (CLARITIN) 10 MG tablet Take 10 mg by mouth daily as needed for allergies.   Yes Historical Provider, MD  Multiple Vitamin (MULTIVITAMIN WITH MINERALS) TABS tablet Take 1 tablet by mouth daily.   Yes Historical Provider, MD  predniSONE (DELTASONE) 2.5 MG tablet Take 2.5 mg by mouth daily with breakfast.   Yes Historical Provider, MD  QUEtiapine (SEROQUEL) 25 MG tablet Take 75 mg by mouth at bedtime.   Yes Historical Provider, MD  sertraline (ZOLOFT) 100 MG tablet Take 2 tablets (200 mg total) by mouth daily. 10/01/14  Yes Kathlee Nations, MD  Skin Protectants, Misc. (DIMETHICONE-ZINC OXIDE) cream Apply topically 2 (two) times daily as needed for dry skin.   Yes Historical Provider, MD  traZODone (DESYREL) 100 MG tablet Take 1 tablet (100 mg  total) by mouth at bedtime. 12/02/14  Yes Kathlee Nations, MD  amoxicillin-clavulanate (AUGMENTIN) 500-125 MG tablet Take 1 tablet (500 mg total) by mouth every 12 (twelve) hours. 10/04/15   Theodis Blaze, MD  ipratropium-albuterol (DUONEB) 0.5-2.5 (3) MG/3ML SOLN Take 3 mLs by nebulization 3 (three) times daily. 04/21/14   Hosie Poisson, MD  memantine (NAMENDA TITRATION PAK) tablet pack 5 mg/day for =1 week; 5 mg twice daily for =1 week; 15 mg/day given in 5 mg and 10 mg separated doses for =1 week; then 10 mg twice daily 05/31/15   Kathrynn Ducking, MD   Physical Exam: Filed Vitals:   10/05/15 1346 10/05/15 1553 10/05/15 1645 10/05/15 1806  BP: 95/54 101/61  125/58  Pulse: 69 68 65 67  Temp: 98.3 F (36.8 C)     TempSrc: Oral     Resp: 16 16 23 20   SpO2: 92%  91% 92% 95%    Wt Readings from Last 3 Encounters:  10/04/15 72.394 kg (159 lb 9.6 oz)  08/25/15 74.39 kg (164 lb)  05/31/15 72.031 kg (158 lb 12.8 oz)    General:  Appears calm and comfortable Eyes: PERRL, normal lids, irises & conjunctiva Neck: no LAD, masses or thyromegaly Cardiovascular: RRR, no m/r/g. No LE edema. Telemetry: SR, no arrhythmias  Respiratory: CTA bilaterally, no w/r/r. Normal respiratory effort. Abdomen: soft, ntnd Skin: no rash or induration seen on limited exam Musculoskeletal: grossly normal tone BUE/BLE Neurologic: alert appears oriented. No focal deficits.           Labs on Admission:  Basic Metabolic Panel:  Recent Labs Lab 10/01/15 0540 10/02/15 0543 10/03/15 0535 10/04/15 0416 10/05/15 1524  NA 141 139 139 141 143  K 3.9 3.7 3.8 4.1 4.0  CL 110 107 106 107 109  CO2 23 23 25 28 27   GLUCOSE 108* 133* 126* 112* 111*  BUN 42* 30* 29* 31* 35*  CREATININE 1.24 1.12 1.17 1.20 1.16  CALCIUM 8.8* 8.4* 8.7* 8.6* 8.6*   Liver Function Tests:  Recent Labs Lab 09/30/15 0740 10/05/15 1524  AST 18 85*  ALT 7* 8*  ALKPHOS 73 78  BILITOT 0.7 0.6  PROT 6.1* 5.5*  ALBUMIN 3.7 3.0*   No  results for input(s): LIPASE, AMYLASE in the last 168 hours. No results for input(s): AMMONIA in the last 168 hours. CBC:  Recent Labs Lab 09/30/15 0740 10/01/15 0540 10/02/15 0543 10/03/15 0535 10/04/15 0416 10/05/15 1524  WBC 7.9 9.6 8.0 8.0 8.4 11.7*  NEUTROABS 7.1  --   --   --   --  9.3*  HGB 13.0 11.4* 10.3* 12.0* 11.3* 10.9*  HCT 40.0 34.8* 31.8* 37.0* 35.0* 33.9*  MCV 89.9 88.8 88.3 89.6 89.5 89.2  PLT 223 202 187 209 197 218   Cardiac Enzymes:  Recent Labs Lab 10/05/15 1848  TROPONINI 0.25*    BNP (last 3 results)  Recent Labs  09/30/15 0740 10/05/15 1524  BNP 85.5 376.6*    ProBNP (last 3 results) No results for input(s): PROBNP in the last 8760 hours.  CBG:  Recent Labs Lab 10/03/15 1149 10/03/15 1637 10/03/15 2107 10/04/15 0721 10/04/15 1217  GLUCAP 229* 123* 105* 107* 123*    Radiological Exams on Admission: Dg Chest 2 View  10/05/2015  CLINICAL DATA:  Pneumonia. Fall yesterday. Altered mental status. Parkinson' s disease. EXAM: CHEST  2 VIEW COMPARISON:  09/30/2015 FINDINGS: Mild enlargement of the cardiopericardial silhouette with bandlike opacity above the knee left hemidiaphragm and also just above the right minor fissure. Mild indistinctness of pulmonary vasculature. Atherosclerotic calcification of the aortic arch. Severe degenerative glenohumeral arthropathy bilaterally with associated bilateral humeral head volume loss. Probable free fragments in the left glenohumeral joint. Left rib deformities, chronic and stable, from healed fractures. Low lung volumes are present, causing crowding of the pulmonary vasculature. No pleural effusion. Lumbar spondylosis. IMPRESSION: 1. Bandlike opacities at the left lung base and right mid lung, probably from atelectasis given their configuration. Bilateral perihilar interstitial accentuation and airway thickening could be from atypical pneumonia or mild interstitial edema. 2. Mild enlargement of the  cardiopericardial silhouette 3. Severe bilateral degenerative glenohumeral arthropathy. Electronically Signed   By: Van Clines M.D.   On: 10/05/2015 15:58   Ct Head Wo Contrast  10/05/2015  CLINICAL DATA:  Altered mental status.  Recent pneumonia and fall. EXAM: CT HEAD WITHOUT CONTRAST TECHNIQUE: Contiguous axial images were obtained  from the base of the skull through the vertex without intravenous contrast. COMPARISON:  08/25/2015 head CT. FINDINGS: No evidence of parenchymal hemorrhage or extra-axial fluid collection. No mass lesion, mass effect, or midline shift. No CT evidence of acute infarction. Re- demonstrated is intracranial atherosclerotic arterial calcification and moderate diffuse cerebral volume loss. Nonspecific stable mild subcortical and periventricular white matter hypodensity, most in keeping with chronic small vessel ischemic change. Cerebral ventricle sizes are stable and concordant with the degree of cerebral volume loss. 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 There is mild partial opacification of the bilateral ethmoidal air cells. No fluid levels in the visualized paranasal sinuses. The mastoid air cells are unopacified. No evidence of calvarial fracture. IMPRESSION: 1.  No evidence of acute intracranial abnormality. 2. Stable intracranial atherosclerosis, diffuse cerebral volume loss and mild chronic small vessel ischemic white matter change. Electronically Signed   By: Ilona Sorrel M.D.   On: 10/05/2015 16:37    EKG: reviewed , normal sinus rhythm,   Assessment/Plan Active Problems:   Elevated troponin   Health care associated pneumonia: Resume augmentin to complete the course.  Resume bronchodilators as needed.    Hypertension: Well controlled.  Resume hydralazine .    Acute encephalopathy: appears to have resolved. Resume seroquel.   Elevated troponin: He denies any chest pain, sob and palpitations.  Cycle troponins and get echo in am  Cardiology  consulted and recommendations given.   UTI: UA abnormal,  Cultures ordered, but he denies any dysuria symptoms, he is already on augmentin. Follow urine cultures.   Parkinson's disease: Resume home meds.   Diabetes Mellitus: SSI while inpatient.      Code Status: DNR.  DVT Prophylaxis: Family Communication: none at bedside.  Disposition Plan: back to SNF in a day or 2.   Time spent: 35 min  [facility] Pager [phone]

## 2015-10-05 NOTE — Plan of Care (Signed)
Problem: Phase I Progression Outcomes Goal: Voiding-avoid urinary catheter unless indicated Outcome: Not Met (add Reason) Chronic Catheter

## 2015-10-05 NOTE — Consult Note (Signed)
CARDIOLOGY CONSULT NOTE   Patient ID: [name] MRN: [id number], DOB/AGE: 78-30-1936   Admit date: 10/05/2015 Date of Consult: 10/05/2015   Primary Physician: No primary care provider on file. Primary Cardiologist: None  Pt. Profile  80 year old gentleman transferred to the emergency room from nursing home with confusion and history of falls.  Noted to have slightly elevated troponin.  Problem List  Past Medical History  Diagnosis Date  . Hearing aid worn   . Hypertension   . Parkinson disease ([place])   . Pneumonia   . Diabetes mellitus type II   . Arthritis   . Bipolar disorder ([place])   . Anxiety   . Depression   . MRSA infection (methicillin-resistant Staphylococcus aureus)   . Chronic indwelling Foley catheter   . C2 cervical fracture ([name])     due to pt fall  . SIRS (systemic inflammatory response syndrome) ([name])   . Memory loss   . Benign prostate hyperplasia   . Memory difficulty 08/21/2014  . Neuromuscular disorder ([place])     parkinsons  . Flu   . Falls   . UTI (urinary tract infection)     Past Surgical History  Procedure Laterality Date  . Total hip arthroplasty    . Video bronchoscopy  12/16/2012    Procedure: VIDEO BRONCHOSCOPY;  Surgeon: Melrose Nakayama, MD;  Location: [place];  Service: Thoracic;  Laterality: Right;  . Video assisted thoracoscopy (vats)/empyema  12/16/2012    Procedure: VIDEO ASSISTED THORACOSCOPY (VATS)/EMPYEMA;  Surgeon: Melrose Nakayama, MD;  Location: [place];  Service: Thoracic;  Laterality: Right;     Allergies  Allergies  Allergen Reactions  . Cholestatin Other (See Comments)    Per MAR    HPI   This 80 year old gentleman has a history of parkinsonism and confusion.  He has a recent history of pneumonia.  He was hospitalized from October 13 through October 17 with healthcare associated pneumonia.  He was discharged on October 17.  He was discharged on Augmentin.  He returned to the emergency room today, one day  later because of worsening confusion.  He denies any chest pain or increasing dyspnea.  He does acknowledge persistent cough.  He does not have any prior history of known heart disease.  He had a echocardiogram in 2015 which showed normal left ventricular systolic function and showed diastolic dysfunction.  He also has a mildly dilated aortic root by echo.  Other diagnoses include bipolar disorder and memory disorder.  His point-of-care troponin is 0.28.  Regular troponin is pending.  His electrocardiogram shows no acute ischemic changes and he is in normal sinus rhythm Inpatient Medications    Family History Family History  Problem Relation Age of Onset  . Depression Father   . Cancer Mother   . Heart disease Mother      Social History Social History   Social History  . Marital Status: Divorced    Spouse Name: N/A  . Number of Children: 2  . Years of Education: college 1   Occupational History  . Retired    Social History Main Topics  . Smoking status: Never Smoker   . Smokeless tobacco: Never Used  . Alcohol Use: No     Comment: occasional  . Drug Use: No  . Sexual Activity: Not Currently   Other Topics Concern  . Not on file   Social History Narrative   Patient is right handed.   Patient drinks 1 cup of caffeine daily.  Review of Systems  General:  No chills, fever, night sweats or weight changes.  Cardiovascular:  No chest pain, dyspnea on exertion, edema, orthopnea, palpitations, paroxysmal nocturnal dyspnea. Dermatological: No rash, lesions/masses Respiratory: Positive for cough Urologic: No hematuria, dysuria Abdominal:   No nausea, vomiting, diarrhea, bright red blood per rectum, melena, or hematemesis Neurologic:  No visual changes, wkns, changes in mental status.  Nursing home sent him back in part because of worsening confusion. All other systems reviewed and are otherwise negative except as noted above.  Physical Exam  Blood pressure 101/61, pulse  65, temperature 98.3 F (36.8 C), temperature source Oral, resp. rate 23, SpO2 92 %.  General: Pleasant, NAD.  Asking why he is here in the hospital and wants to go home. Psych: Confused Neuro: Confused.. Moves all extremities spontaneously. HEENT: Normal  Neck: Supple without bruits or JVD. Lungs:  Resp regular and unlabored, the patient is lying flat comfortably.  On auscultation he has bilateral sticky rales suggestive of persistent pneumonia Heart: RRR no s3, s4, or murmurs. Abdomen: Soft, non-tender, non-distended, BS + x 4.  Extremities: No clubbing, cyanosis or edema. DP/PT/Radials 2+ and equal bilaterally.  Labs  No results for input(s): CKTOTAL, CKMB, TROPONINI in the last 72 hours. Lab Results  Component Value Date   WBC 11.7* 10/05/2015   HGB 10.9* 10/05/2015   HCT 33.9* 10/05/2015   MCV 89.2 10/05/2015   PLT 218 10/05/2015     Recent Labs Lab 10/05/15 1524  NA 143  K 4.0  CL 109  CO2 27  BUN 35*  CREATININE 1.16  CALCIUM 8.6*  PROT 5.5*  BILITOT 0.6  ALKPHOS 78  ALT 8*  AST 85*  GLUCOSE 111*   No results found for: CHOL, HDL, LDLCALC, TRIG No results found for: DDIMER  Radiology/Studies  Dg Chest 2 View  10/05/2015  CLINICAL DATA:  Pneumonia. Fall yesterday. Altered mental status. Parkinson' s disease. EXAM: CHEST  2 VIEW COMPARISON:  09/30/2015 FINDINGS: Mild enlargement of the cardiopericardial silhouette with bandlike opacity above the knee left hemidiaphragm and also just above the right minor fissure. Mild indistinctness of pulmonary vasculature. Atherosclerotic calcification of the aortic arch. Severe degenerative glenohumeral arthropathy bilaterally with associated bilateral humeral head volume loss. Probable free fragments in the left glenohumeral joint. Left rib deformities, chronic and stable, from healed fractures. Low lung volumes are present, causing crowding of the pulmonary vasculature. No pleural effusion. Lumbar spondylosis. IMPRESSION: 1.  Bandlike opacities at the left lung base and right mid lung, probably from atelectasis given their configuration. Bilateral perihilar interstitial accentuation and airway thickening could be from atypical pneumonia or mild interstitial edema. 2. Mild enlargement of the cardiopericardial silhouette 3. Severe bilateral degenerative glenohumeral arthropathy. Electronically Signed   By: Van Clines M.D.   On: 10/05/2015 15:58   Ct Head Wo Contrast  10/05/2015  CLINICAL DATA:  Altered mental status.  Recent pneumonia and fall. EXAM: CT HEAD WITHOUT CONTRAST TECHNIQUE: Contiguous axial images were obtained from the base of the skull through the vertex without intravenous contrast. COMPARISON:  08/25/2015 head CT. FINDINGS: No evidence of parenchymal hemorrhage or extra-axial fluid collection. No mass lesion, mass effect, or midline shift. No CT evidence of acute infarction. Re- demonstrated is intracranial atherosclerotic arterial calcification and moderate diffuse cerebral volume loss. Nonspecific stable mild subcortical and periventricular white matter hypodensity, most in keeping with chronic small vessel ischemic change. Cerebral ventricle sizes are stable and concordant with the degree of cerebral volume loss. There is  stable mega cisterna magna. There is mild partial opacification of the bilateral ethmoidal air cells. No fluid levels in the visualized paranasal sinuses. The mastoid air cells are unopacified. No evidence of calvarial fracture. IMPRESSION: 1.  No evidence of acute intracranial abnormality. 2. Stable intracranial atherosclerosis, diffuse cerebral volume loss and mild chronic small vessel ischemic white matter change. Electronically Signed   By: Ilona Sorrel M.D.   On: 10/05/2015 16:37   Dg Chest Port 1 View  09/30/2015  CLINICAL DATA:  Shortness of breath with decreased oxygen saturation EXAM: PORTABLE CHEST 1 VIEW COMPARISON:  March 16, 2015 FINDINGS: There is airspace consolidation  throughout the right mid and lower lung zones. Left lung is clear. Heart is upper normal in size with pulmonary vascularity within normal limits. No appreciable adenopathy. There old healed rib fractures on the left. There is marked arthropathy in each shoulder. IMPRESSION: Areas of airspace consolidation in the right mid lower lung zones, most consistent with pneumonia. Lungs elsewhere clear. No change in cardiac silhouette. Followup PA and lateral chest radiographs recommended in 3-4 weeks following trial of antibiotic therapy to ensure resolution and exclude underlying malignancy. Electronically Signed   By: Lowella Grip III M.D.   On: 09/30/2015 07:50    ECG  Normal sinus rhythm.  Nonspecific ST-T wave changes.  ASSESSMENT AND PLAN  1.  Recent bilateral pneumonia, with leukocytosis and persistent cough and congestion 2.  Increased confusion 3.  Leukocytosis 4.  Minimal elevation of point-of-care troponin.  There is no evidence of acute coronary syndrome.  The slight elevation may be secondary to the stress of his underlying pulmonary illness. 5.  Bipolar illness 6.  Parkinsonism  Recommendation: From the cardiac standpoint I would cycle his enzymes.  I would suggest repeating an electrocardiogram in the morning.  I reviewed his chest x-ray.  I think that the pulmonary markings are more likely pulmonary than cardiac.  His recent BNP obtained 5 days ago was normal at 85. Berna Spare MD  10/05/2015, 6:04 PM

## 2015-10-06 ENCOUNTER — Observation Stay (HOSPITAL_BASED_OUTPATIENT_CLINIC_OR_DEPARTMENT_OTHER): Payer: Medicare Other

## 2015-10-06 DIAGNOSIS — F0390 Unspecified dementia without behavioral disturbance: Secondary | ICD-10-CM

## 2015-10-06 DIAGNOSIS — J918 Pleural effusion in other conditions classified elsewhere: Secondary | ICD-10-CM | POA: Diagnosis not present

## 2015-10-06 DIAGNOSIS — G20 Parkinson's disease: Secondary | ICD-10-CM

## 2015-10-06 DIAGNOSIS — I5032 Chronic diastolic (congestive) heart failure: Secondary | ICD-10-CM | POA: Diagnosis not present

## 2015-10-06 DIAGNOSIS — I10 Essential (primary) hypertension: Secondary | ICD-10-CM

## 2015-10-06 DIAGNOSIS — R7989 Other specified abnormal findings of blood chemistry: Secondary | ICD-10-CM | POA: Diagnosis not present

## 2015-10-06 LAB — CBC
HEMATOCRIT: 33.2 % — AB (ref 39.0–52.0)
Hemoglobin: 10.7 g/dL — ABNORMAL LOW (ref 13.0–17.0)
MCH: 29.6 pg (ref 26.0–34.0)
MCHC: 32.2 g/dL (ref 30.0–36.0)
MCV: 91.7 fL (ref 78.0–100.0)
Platelets: 216 10*3/uL (ref 150–400)
RBC: 3.62 MIL/uL — AB (ref 4.22–5.81)
RDW: 15.9 % — AB (ref 11.5–15.5)
WBC: 8.7 10*3/uL (ref 4.0–10.5)

## 2015-10-06 LAB — CULTURE, BLOOD (ROUTINE X 2)
CULTURE: NO GROWTH
CULTURE: NO GROWTH

## 2015-10-06 LAB — URINE CULTURE

## 2015-10-06 LAB — TROPONIN I
Troponin I: 0.24 ng/mL — ABNORMAL HIGH (ref <0.031)
Troponin I: 0.2 ng/mL — ABNORMAL HIGH (ref <0.031)

## 2015-10-06 LAB — BASIC METABOLIC PANEL
ANION GAP: 5 (ref 5–15)
BUN: 29 mg/dL — ABNORMAL HIGH (ref 6–20)
CHLORIDE: 110 mmol/L (ref 101–111)
CO2: 28 mmol/L (ref 22–32)
Calcium: 8.5 mg/dL — ABNORMAL LOW (ref 8.9–10.3)
Creatinine, Ser: 1.2 mg/dL (ref 0.61–1.24)
GFR, EST NON AFRICAN AMERICAN: 56 mL/min — AB (ref >60)
GLUCOSE: 96 mg/dL (ref 65–99)
POTASSIUM: 3.9 mmol/L (ref 3.5–5.1)
Sodium: 143 mmol/L (ref 135–145)

## 2015-10-06 LAB — GLUCOSE, CAPILLARY
GLUCOSE-CAPILLARY: 147 mg/dL — AB (ref 65–99)
Glucose-Capillary: 80 mg/dL (ref 65–99)
Glucose-Capillary: 144 mg/dL — ABNORMAL HIGH (ref 65–99)
Glucose-Capillary: 95 mg/dL (ref 65–99)

## 2015-10-06 MED ORDER — CLOPIDOGREL BISULFATE 75 MG PO TABS
75.0000 mg | ORAL_TABLET | Freq: Every day | ORAL | Status: DC
  Administered 2015-10-06 – 2015-10-07 (×2): 75 mg via ORAL
  Filled 2015-10-06 (×2): qty 1

## 2015-10-06 MED ORDER — RESOURCE THICKENUP CLEAR PO POWD
ORAL | Status: DC | PRN
  Filled 2015-10-06: qty 125

## 2015-10-06 MED ORDER — ISOSORBIDE MONONITRATE ER 30 MG PO TB24
30.0000 mg | ORAL_TABLET | Freq: Every day | ORAL | Status: DC
  Administered 2015-10-06 – 2015-10-07 (×2): 30 mg via ORAL
  Filled 2015-10-06 (×2): qty 1

## 2015-10-06 MED ORDER — ENOXAPARIN SODIUM 40 MG/0.4ML ~~LOC~~ SOLN
40.0000 mg | Freq: Every day | SUBCUTANEOUS | Status: DC
  Administered 2015-10-06: 40 mg via SUBCUTANEOUS
  Filled 2015-10-06: qty 0.4

## 2015-10-06 NOTE — Progress Notes (Signed)
PROGRESS NOTE  [name] LNL:[id number] DOB: 07/31/1935 DOA: 10/05/2015 PCP: No primary care provider on file.  HPI/Recap of past 24 hours:  Denies chest pain, no sob, not oriented, likely baseline dementia  Assessment/Plan: Active Problems:   Parkinson disease ([name])   Elevated troponin   Hypertension   Diabetes mellitus, type 2 ([name])   Chronic diastolic congestive heart failure ([name])   PNA (pneumonia)   Dementia without behavioral disturbance  Elevation of troponin, denies chest pain, no sob, bp stable, cardiology consulted, advised conservative management with adding plavix and imdur.  Confusion, vs baseline dementia, seems at baseline.  QTc prolongation, repeat ckg, check mag/tsh, avoid Qt prolonging agent.  H/o parkinsons: continue home meds  Elevated am blood sugar with a1c 5.6 in 09/2015, elevated blood sugar likely steroid induced,. Diet control, outpatient monitor blood sugar.  Bacteruria; no symptom, urine culture inconclusive, continue augmentin.  Code Status: full  Family Communication: patient   Disposition Plan: SNF, likely 10/20   Consultants:  cardiology  Procedures:  none  Antibiotics:  Oral augmentin   Objective: BP 131/69 mmHg  Pulse 60  Temp(Src) 98.5 F (36.9 C) (Oral)  Resp 18  Ht 5\' 11"  (1.803 m)  Wt 159 lb 3.2 oz (72.213 kg)  BMI 22.21 kg/m2  SpO2 96%  Intake/Output Summary (Last 24 hours) at 10/06/15 1837 Last data filed at 10/06/15 1500  Gross per 24 hour  Intake    240 ml  Output   1350 ml  Net  -1110 ml   Filed Weights   10/05/15 2118  Weight: 159 lb 3.2 oz (72.213 kg)    Exam:   General:  NAD, not oriented, follow command  Cardiovascular: RRR  Respiratory: CTABL  Abdomen: Soft/ND/NT, positive BS  Musculoskeletal: No Edema  Neuro: no focal findings, does has chronic tremors and baseline dementia  Data Reviewed: Basic Metabolic Panel:  Recent Labs Lab 10/02/15 0543 10/03/15 0535  10/04/15 0416 10/05/15 1524 10/06/15 0630  NA 139 139 141 143 143  K 3.7 3.8 4.1 4.0 3.9  CL 107 106 107 109 110  CO2 23 25 28 27 28   GLUCOSE 133* 126* 112* 111* 96  BUN 30* 29* 31* 35* 29*  CREATININE 1.12 1.17 1.20 1.16 1.20  CALCIUM 8.4* 8.7* 8.6* 8.6* 8.5*   Liver Function Tests:  Recent Labs Lab 09/30/15 0740 10/05/15 1524  AST 18 85*  ALT 7* 8*  ALKPHOS 73 78  BILITOT 0.7 0.6  PROT 6.1* 5.5*  ALBUMIN 3.7 3.0*   No results for input(s): LIPASE, AMYLASE in the last 168 hours. No results for input(s): AMMONIA in the last 168 hours. CBC:  Recent Labs Lab 09/30/15 0740  10/02/15 0543 10/03/15 0535 10/04/15 0416 10/05/15 1524 10/06/15 0630  WBC 7.9  < > 8.0 8.0 8.4 11.7* 8.7  NEUTROABS 7.1  --   --   --   --  9.3*  --   HGB 13.0  < > 10.3* 12.0* 11.3* 10.9* 10.7*  HCT 40.0  < > 31.8* 37.0* 35.0* 33.9* 33.2*  MCV 89.9  < > 88.3 89.6 89.5 89.2 91.7  PLT 223  < > 187 209 197 218 216  < > = values in this interval not displayed. Cardiac Enzymes:    Recent Labs Lab 10/05/15 1848 10/06/15 0033 10/06/15 0630  TROPONINI 0.25* 0.24* 0.20*   BNP (last 3 results)  Recent Labs  09/30/15 0740 10/05/15 1524  BNP 85.5 376.6*    ProBNP (last 3 results)  No results for input(s): PROBNP in the last 8760 hours.  CBG:  Recent Labs Lab 10/04/15 1217 10/05/15 2206 10/06/15 0743 10/06/15 1201 10/06/15 1642  GLUCAP 123* 103* 80 144* 95    Recent Results (from the past 240 hour(s))  Blood Culture (routine x 2)     Status: None   Collection Time: 09/30/15  7:40 AM  Result Value Ref Range Status   Specimen Description BLOOD RIGHT ANTECUBITAL  Final   Special Requests BOTTLES DRAWN AEROBIC AND ANAEROBIC 5ML  Final   Culture  Setup Time   Final    GRAM NEGATIVE RODS IN BOTH AEROBIC AND ANAEROBIC BOTTLES CRITICAL RESULT CALLED TO, READ BACK BY AND VERIFIED WITH: B DAVIS RN 2229 09/30/15 A BROWNING    Culture   Final    PROTEUS MIRABILIS SUSCEPTIBILITIES  PERFORMED ON PREVIOUS CULTURE WITHIN THE LAST 5 DAYS. Performed at [facility]    Report Status 10/02/2015 FINAL  Final  Blood Culture (routine x 2)     Status: None   Collection Time: 09/30/15  7:45 AM  Result Value Ref Range Status   Specimen Description BLOOD LEFT ANTECUBITAL  Final   Special Requests BOTTLES DRAWN AEROBIC AND ANAEROBIC 5CC  Final   Culture  Setup Time   Final    GRAM NEGATIVE RODS ANAEROBIC BOTTLE ONLY CRITICAL RESULT CALLED TO, READ BACK BY AND VERIFIED WITH: B DAVIS RN 2309 09/30/15 A BROWNING    Culture   Final    PROTEUS MIRABILIS Performed at [facility]    Report Status 10/02/2015 FINAL  Final   Organism ID, Bacteria PROTEUS MIRABILIS  Final      Susceptibility   Proteus mirabilis - MIC*    AMPICILLIN <=2 SENSITIVE Sensitive     CEFAZOLIN <=4 SENSITIVE Sensitive     CEFEPIME <=1 SENSITIVE Sensitive     CEFTAZIDIME <=1 SENSITIVE Sensitive     CEFTRIAXONE <=1 SENSITIVE Sensitive     CIPROFLOXACIN <=0.25 SENSITIVE Sensitive     GENTAMICIN <=1 SENSITIVE Sensitive     IMIPENEM 2 SENSITIVE Sensitive     TRIMETH/SULFA <=20 SENSITIVE Sensitive     AMPICILLIN/SULBACTAM <=2 SENSITIVE Sensitive     PIP/TAZO <=4 SENSITIVE Sensitive     * PROTEUS MIRABILIS  Urine culture     Status: None   Collection Time: 09/30/15 10:05 AM  Result Value Ref Range Status   Specimen Description URINE, CATHETERIZED  Final   Special Requests NONE  Final   Culture   Final    MULTIPLE SPECIES PRESENT, SUGGEST RECOLLECTION Performed at [facility]    Report Status 10/01/2015 FINAL  Final  MRSA PCR Screening     Status: Abnormal   Collection Time: 09/30/15 11:05 AM  Result Value Ref Range Status   MRSA by PCR POSITIVE (A) NEGATIVE Final    Comment:        The GeneXpert MRSA Assay (FDA approved for NASAL specimens only), is one component of a comprehensive MRSA colonization surveillance program. It is not intended to diagnose MRSA infection nor to  guide or monitor treatment for MRSA infections. RESULT CALLED TO, READ BACK BY AND VERIFIED WITH: CAROLYN CREECH,RN [id number] @ 4944 BY J SCOTTON   Culture, blood (routine x 2)     Status: None   Collection Time: 10/01/15  2:45 PM  Result Value Ref Range Status   Specimen Description BLOOD LEFT ARM  Final   Special Requests BOTTLES DRAWN AEROBIC AND ANAEROBIC 10 CC EA  Final   Culture   Final    NO GROWTH 5 DAYS Performed at [facility]    Report Status 10/06/2015 FINAL  Final  Culture, blood (routine x 2)     Status: None   Collection Time: 10/01/15  2:55 PM  Result Value Ref Range Status   Specimen Description BLOOD RIGHT HAND  Final   Special Requests BOTTLES DRAWN AEROBIC AND ANAEROBIC 8 CC EA  Final   Culture   Final    NO GROWTH 5 DAYS Performed at [facility]    Report Status 10/06/2015 FINAL  Final  Urine culture     Status: None   Collection Time: 10/01/15  3:01 PM  Result Value Ref Range Status   Specimen Description URINE, CATHETERIZED  Final   Special Requests NONE  Final   Culture   Final    NO GROWTH 2 DAYS Performed at [facility]    Report Status 10/03/2015 FINAL  Final  Urine culture     Status: None   Collection Time: 10/05/15  3:55 PM  Result Value Ref Range Status   Specimen Description URINE, CATHETERIZED  Final   Special Requests NONE  Final   Culture   Final    MULTIPLE SPECIES PRESENT, SUGGEST RECOLLECTION Performed at [facility]    Report Status 10/06/2015 FINAL  Final     Studies: No results found.  Scheduled Meds: . amoxicillin-clavulanate  1 tablet Oral Q12H  . carbidopa-levodopa  1 tablet Oral TID  . clonazePAM  0.25 mg Oral Q1200  . clonazePAM  0.5 mg Oral QHS  . clopidogrel  75 mg Oral Daily  . enoxaparin (LOVENOX) injection  40 mg Subcutaneous QHS  . entacapone  200 mg Oral TID  . famotidine  20 mg Oral BID  . feeding supplement (GLUCERNA SHAKE)  237 mL Oral Daily  . hydrALAZINE  25 mg Oral BID   . insulin aspart  0-9 Units Subcutaneous TID WC  . ipratropium-albuterol  3 mL Nebulization TID  . isosorbide mononitrate  30 mg Oral Daily  . lamoTRIgine  150 mg Oral BID  . lisinopril  10 mg Oral Daily  . predniSONE  2.5 mg Oral Q breakfast  . QUEtiapine  75 mg Oral QHS  . sertraline  200 mg Oral Daily  . sodium chloride  3 mL Intravenous Q12H  . traZODone  100 mg Oral QHS    Continuous Infusions:    Time spent: 84mins  Lailee Hoelzel MD, PhD  [facility] Pager [phone]. If 7PM-7AM, please contact night-coverage at [email or web address], password [facility] 10/06/2015, 6:37 PM

## 2015-10-06 NOTE — Progress Notes (Signed)
This RN took over this patient's care at 2300 and agrees with the assessment of the previous Therapist, sports. Will continue to monitor.  Sady Monaco, RN

## 2015-10-06 NOTE — Progress Notes (Signed)
*  PRELIMINARY RESULTS* Echocardiogram 2D Echocardiogram has been performed.  Leavy Cella 10/06/2015, 2:24 PM

## 2015-10-06 NOTE — Progress Notes (Addendum)
Patient Profile: 80 year old gentleman with no prior cardiac history. Has a h/o parkinsonism and confusion. He was recently hospitalized 10/13-10/17 for HCAP. Discharged on Augmentin. Returned to the emergency room from nursing home with worsening confusion and history of falls. Noted to have slightly elevated troponin.  Subjective: Doing well. Denies any chest pain or dyspnea.   Objective: Vital signs in last 24 hours: Temp:  [97.9 F (36.6 C)-98.7 F (37.1 C)] 97.9 F (36.6 C) (10/19 0544) Pulse Rate:  [58-69] 58 (10/19 0544) Resp:  [16-23] 18 (10/19 0544) BP: (95-141)/(54-66) 129/63 mmHg (10/19 0544) SpO2:  [91 %-97 %] 95 % (10/19 0544) Weight:  [159 lb 3.2 oz (72.213 kg)] 159 lb 3.2 oz (72.213 kg) (10/18 2118) Last BM Date: 10/04/15  Intake/Output from previous day: 10/18 0701 - 10/19 0700 In: -  Out: 650 [Urine:650] Intake/Output this shift:    Medications Current Facility-Administered Medications  Medication Dose Route Frequency Provider Last Rate Last Dose  . albuterol (PROVENTIL) (2.5 MG/3ML) 0.083% nebulizer solution 2.5 mg  2.5 mg Nebulization Q2H PRN Hosie Poisson, MD      . amoxicillin-clavulanate (AUGMENTIN) 875-125 MG per tablet 1 tablet  1 tablet Oral Q12H Hosie Poisson, MD   1 tablet at 10/05/15 2200  . carbidopa-levodopa (SINEMET IR) 25-250 MG per tablet immediate release 1 tablet  1 tablet Oral TID Hosie Poisson, MD   1 tablet at 10/05/15 2159  . clonazePAM (KLONOPIN) tablet 0.25 mg  0.25 mg Oral Q1200 Hosie Poisson, MD      . clonazePAM (KLONOPIN) tablet 0.5 mg  0.5 mg Oral QHS Hosie Poisson, MD   0.5 mg at 10/05/15 2200  . enoxaparin (LOVENOX) injection 30 mg  30 mg Subcutaneous QHS Hosie Poisson, MD   30 mg at 10/05/15 2159  . entacapone (COMTAN) tablet 200 mg  200 mg Oral TID Hosie Poisson, MD   200 mg at 10/05/15 2200  . famotidine (PEPCID) tablet 20 mg  20 mg Oral BID Hosie Poisson, MD   20 mg at 10/05/15 2200  . feeding supplement (GLUCERNA SHAKE)  (GLUCERNA SHAKE) liquid 237 mL  237 mL Oral Daily Hosie Poisson, MD   237 mL at 10/05/15 2115  . hydrALAZINE (APRESOLINE) tablet 25 mg  25 mg Oral BID Hosie Poisson, MD   25 mg at 10/05/15 2200  . insulin aspart (novoLOG) injection 0-9 Units  0-9 Units Subcutaneous TID WC Hosie Poisson, MD      . ipratropium-albuterol (DUONEB) 0.5-2.5 (3) MG/3ML nebulizer solution 3 mL  3 mL Nebulization TID Hosie Poisson, MD   3 mL at 10/05/15 2200  . lamoTRIgine (LAMICTAL) tablet 150 mg  150 mg Oral BID Hosie Poisson, MD   150 mg at 10/05/15 2159  . lisinopril (PRINIVIL,ZESTRIL) tablet 10 mg  10 mg Oral Daily Hosie Poisson, MD      . loratadine (CLARITIN) tablet 10 mg  10 mg Oral Daily PRN Hosie Poisson, MD      . predniSONE (DELTASONE) tablet 2.5 mg  2.5 mg Oral Q breakfast Hosie Poisson, MD      . QUEtiapine (SEROQUEL) tablet 75 mg  75 mg Oral QHS Hosie Poisson, MD   75 mg at 10/05/15 2159  . sertraline (ZOLOFT) tablet 200 mg  200 mg Oral Daily Hosie Poisson, MD   200 mg at 10/05/15 2159  . sodium chloride 0.9 % injection 3 mL  3 mL Intravenous Q12H Hosie Poisson, MD   3 mL at 10/05/15 2200  . traZODone (DESYREL) tablet  100 mg  100 mg Oral QHS Hosie Poisson, MD   100 mg at 10/05/15 2159    PE: General appearance: alert, cooperative, no distress and mildly confused at times Neck: no carotid bruit and no JVD Lungs: resp regular and unlabored. bilateral rales c/w PNA Heart: regular rate and rhythm, S1, S2 normal, no murmur, click, rub or gallop Extremities: no LEE Pulses: 2+ and symmetric Skin: warm and dry Neurologic: mildly confused.  Lab Results:   Recent Labs  10/04/15 0416 10/05/15 1524 10/06/15 0630  WBC 8.4 11.7* 8.7  HGB 11.3* 10.9* 10.7*  HCT 35.0* 33.9* 33.2*  PLT 197 218 216   BMET  Recent Labs  10/04/15 0416 10/05/15 1524 10/06/15 0630  NA 141 143 143  K 4.1 4.0 3.9  CL 107 109 110  CO2 28 27 28   GLUCOSE 112* 111* 96  BUN 31* 35* 29*  CREATININE 1.20 1.16 1.20  CALCIUM 8.6* 8.6*  8.5*   Cardiac Panel (last 3 results)  Recent Labs  10/05/15 1848 10/06/15 0033 10/06/15 0630  TROPONINI 0.25* 0.24* 0.20*    Studies/Results: 2D Echo - pending   Assessment/Plan  Active Problems:   Parkinson disease ([name])   Elevated troponin   Hypertension   Diabetes mellitus, type 2 ([name])   Chronic diastolic congestive heart failure ([name])   PNA (pneumonia)   Dementia without behavioral disturbance    1. Elevated troponin: Flat trend. 0.25-->0.24-->0.20. No previous cardiac history. Denies any recent CP or dyspnea. BNP normal at 85. CXR findings are not c/w edema. 2D echo in 2015 showed normal LVF and wall motion. We will obtain an echocardiogram today to reassess wall motion and systolic function, to help determine if any additional cardiac w/u is needed. If normal study, would not pursue any further inpatient w/u. May consider an OP stress test further down the road if normal echo, but will defer to MD.  We will f/u on the results.    Brittainy M. Ladoris Gene 10/06/2015 8:06 AM  Attending Note:   The patient was seen and examined.  Agree with assessment and plan as noted above.  Changes made to the above note as needed.  I have reviewed the echo images.  His LV function is well preserved.  Mild AI, trace - mild MR .  He has not had any chest pain . He may have had a small Troponin leak associated with his initial pneumonia episode.  His ECG shows lateral TWI - cannot rule out lat ischemia.   He has dementia, parkinsons, and has relatively poor health at baseline I think that conservative therapy would be best.  He is asymptomatic.  Would add Plavix 75 mg a day and Imdur 30 mg a day . He can also have NTG to take PRN but it should be noted that he has never had any cp   No further cardiac  work up is indicated.  He may follow up with Dr. Mare Ferrari if needed   Will sign off. Call for questions.    Thayer Headings, Brooke Bonito., MD, [facility] 10/06/2015, 3:05 PM [address],  [place] Pager [phone]

## 2015-10-07 DIAGNOSIS — Z9289 Personal history of other medical treatment: Secondary | ICD-10-CM | POA: Diagnosis not present

## 2015-10-07 DIAGNOSIS — G20 Parkinson's disease: Secondary | ICD-10-CM | POA: Diagnosis not present

## 2015-10-07 DIAGNOSIS — J189 Pneumonia, unspecified organism: Secondary | ICD-10-CM | POA: Diagnosis not present

## 2015-10-07 DIAGNOSIS — R7989 Other specified abnormal findings of blood chemistry: Secondary | ICD-10-CM | POA: Diagnosis not present

## 2015-10-07 DIAGNOSIS — F0390 Unspecified dementia without behavioral disturbance: Secondary | ICD-10-CM | POA: Diagnosis not present

## 2015-10-07 DIAGNOSIS — E119 Type 2 diabetes mellitus without complications: Secondary | ICD-10-CM | POA: Diagnosis not present

## 2015-10-07 DIAGNOSIS — I5032 Chronic diastolic (congestive) heart failure: Secondary | ICD-10-CM | POA: Diagnosis not present

## 2015-10-07 LAB — COMPREHENSIVE METABOLIC PANEL
ALT: 34 U/L (ref 17–63)
ANION GAP: 5 (ref 5–15)
AST: 123 U/L — ABNORMAL HIGH (ref 15–41)
Albumin: 2.7 g/dL — ABNORMAL LOW (ref 3.5–5.0)
Alkaline Phosphatase: 73 U/L (ref 38–126)
BILIRUBIN TOTAL: 0.7 mg/dL (ref 0.3–1.2)
BUN: 26 mg/dL — ABNORMAL HIGH (ref 6–20)
CHLORIDE: 109 mmol/L (ref 101–111)
CO2: 28 mmol/L (ref 22–32)
Calcium: 8.4 mg/dL — ABNORMAL LOW (ref 8.9–10.3)
Creatinine, Ser: 1.15 mg/dL (ref 0.61–1.24)
GFR, EST NON AFRICAN AMERICAN: 59 mL/min — AB (ref >60)
Glucose, Bld: 98 mg/dL (ref 65–99)
POTASSIUM: 4.1 mmol/L (ref 3.5–5.1)
Sodium: 142 mmol/L (ref 135–145)
TOTAL PROTEIN: 5 g/dL — AB (ref 6.5–8.1)

## 2015-10-07 LAB — CBC WITH DIFFERENTIAL/PLATELET
BASOS ABS: 0 10*3/uL (ref 0.0–0.1)
Basophils Relative: 0 %
EOS PCT: 3 %
Eosinophils Absolute: 0.4 10*3/uL (ref 0.0–0.7)
HEMATOCRIT: 32.6 % — AB (ref 39.0–52.0)
Hemoglobin: 10.4 g/dL — ABNORMAL LOW (ref 13.0–17.0)
LYMPHS ABS: 2.2 10*3/uL (ref 0.7–4.0)
Lymphocytes Relative: 18 %
MCH: 29.4 pg (ref 26.0–34.0)
MCHC: 31.9 g/dL (ref 30.0–36.0)
MCV: 92.1 fL (ref 78.0–100.0)
MONO ABS: 0.6 10*3/uL (ref 0.1–1.0)
MONOS PCT: 5 %
NEUTROS ABS: 8.9 10*3/uL — AB (ref 1.7–7.7)
Neutrophils Relative %: 74 %
PLATELETS: 258 10*3/uL (ref 150–400)
RBC: 3.54 MIL/uL — ABNORMAL LOW (ref 4.22–5.81)
RDW: 15.7 % — AB (ref 11.5–15.5)
WBC: 12.1 10*3/uL — AB (ref 4.0–10.5)

## 2015-10-07 LAB — MAGNESIUM: MAGNESIUM: 2.2 mg/dL (ref 1.7–2.4)

## 2015-10-07 LAB — GLUCOSE, CAPILLARY
GLUCOSE-CAPILLARY: 112 mg/dL — AB (ref 65–99)
GLUCOSE-CAPILLARY: 133 mg/dL — AB (ref 65–99)

## 2015-10-07 LAB — TSH: TSH: 1.745 u[IU]/mL (ref 0.350–4.500)

## 2015-10-07 MED ORDER — ISOSORBIDE MONONITRATE ER 30 MG PO TB24: 30.0000 mg | ORAL_TABLET | Freq: Every day | ORAL | Status: AC

## 2015-10-07 MED ORDER — AMOXICILLIN-POT CLAVULANATE 875-125 MG PO TABS: 1.0000 | ORAL_TABLET | Freq: Two times a day (BID) | ORAL | Status: DC

## 2015-10-07 MED ORDER — RESOURCE THICKENUP CLEAR PO POWD: 1.0000 | ORAL | Status: DC | PRN

## 2015-10-07 MED ORDER — CLOPIDOGREL BISULFATE 75 MG PO TABS: 75.0000 mg | ORAL_TABLET | Freq: Every day | ORAL | Status: AC

## 2015-10-07 MED ORDER — LISINOPRIL 10 MG PO TABS: 5.0000 mg | ORAL_TABLET | Freq: Every day | ORAL | Status: DC

## 2015-10-07 NOTE — Clinical Social Work Note (Signed)
Clinical Social Work Assessment  Patient Details  Name: [name] MRN: [id number] Date of Birth: March 11, 1935  Date of referral:  10/07/15               Reason for consult:  Discharge Planning                Permission sought to share information with:  Family Supports, Customer service manager Permission granted to share information::  Yes, Verbal Permission Granted  Name::      [name] [id number])  Agency::     Relationship::     Contact Information:     Housing/Transportation Living arrangements for the past 2 months:  [place] of Information:  Patient, Other (Comment Required) Patient Interpreter Needed:  None Criminal Activity/Legal Involvement Pertinent to Current Situation/Hospitalization:  No - Comment as needed Significant Relationships:  None Lives with:    Do you feel safe going back to the place where you live?  Yes Need for family participation in patient care:  Yes (Comment)  Care giving concerns:  Patient ready for return to ALF today   Social Worker assessment / plan:  CSW has confirmed plans for return to [facility] with pt, family- [place] and ALF rep.  Employment status:    Insurance information:  Medicare PT Recommendations:  Not assessed at this time Information / Referral to community resources:     Patient/Family's Response to care:  [name] (family) asking for update and CSW has given her # for RN.   Patient/Family's Understanding of and Emotional Response to Diagnosis, Current Treatment, and Prognosis:  NA  Emotional Assessment Appearance:  Appears stated age Attitude/Demeanor/Rapport:  Unable to Assess Affect (typically observed):  Accepting Orientation:  Oriented to Self, Oriented to Place, Oriented to  Time, Oriented to Situation Alcohol / Substance use:    Psych involvement (Current and /or in the community):     Discharge Needs  Concerns to be addressed:  No discharge needs  identified Readmission within the last 30 days:  No Current discharge risk:  None Barriers to Discharge:  No Barriers Identified   Ludwig Clarks, LCSW 10/07/2015, 12:59 PM

## 2015-10-07 NOTE — Progress Notes (Signed)
Upon discharge, Pharmacy was called regarding orders for Duoneb and Namenda titration pack. Per [place] facility, pt never started the meds at facility, so if they were to be continued, facility needed new RX.   Spoke with Dr. Erlinda Hong, who, once informed of above info, ordered these meds to be marked as discontinued on discharge instructions.   Dr. Erlinda Hong also recommended that pt sees a neurologist as follow up regarding Namenda.   Spoke to [place] at facility at [phone] and informed of these changes as well as MD recommendation.   Royetta Asal PharmD, BCPS 10/07/15 12:31

## 2015-10-07 NOTE — Discharge Summary (Addendum)
Discharge Summary  [name] MVE:[id number] DOB: [birth year]  PCP: No primary care provider on file.  Admit date: 10/05/2015 Discharge date: 10/07/2015  Time spent: >51mins  Recommendations for Outpatient Follow-up:  1. F/u with PMD within a week for hospital discharge follow up.   2. F/u with cardiology 3. meds change: started plavix and imdur per cardiology recommendation, lisinopril dose decreased from 10 mg po qd to 5mg  po qd 4. Diet: puree diet and honey thick liquid 5. Chronic indwelling foley 6. Patient needs to follow up with neurology regarding the need of namenda (this was listed as home meds, however, per SNF patient was not on it) 7. Code status: DNR  Discharge Diagnoses:  Active Hospital Problems   Diagnosis Date Noted  . Dementia without behavioral disturbance 03/25/2015  . PNA (pneumonia) 04/18/2014  . Chronic diastolic congestive heart failure ([place]) 02/01/2013  . Diabetes mellitus, type 2 ([place])   . Hypertension   . Elevated troponin 04/16/2012  . Parkinson disease ([place]) 04/16/2012    Resolved Hospital Problems   Diagnosis Date Noted Date Resolved  No resolved problems to display.    Discharge Condition: stable  Diet recommendation: heart healthy/carb modified, puree diet and honey thick liquid  Filed Weights   10/05/15 2118 10/07/15 0500  Weight: 159 lb 3.2 oz (72.213 kg) 157 lb 1.6 oz (71.26 kg)    History of present illness: (per admitting MD Dr. Karleen Hampshire) [name] is a 80 y.o. male with prior h/o hypertension, parkinson disease, DM, recently diag health care associated pneumonia, discharged yesterday from medical service to SNF was brought to ED for increased confusion. He underwent routine lab work and he was found to have mild leukocytosis, normal lactic acid, abnormal UA, and elevated troponin. On further questioning the patient he is alert and oriented to place , person and year and denies any other complaints.  He was referred to  medical service for admission for abnormal troponin and new EKG changes.  Cardiology consulted by EDP recommended observation, cycle troponins and an echocardiogram in am.    Hospital Course:  Active Problems:   Parkinson disease ([name])   Elevated troponin   Hypertension   Diabetes mellitus, type 2 ([name])   Chronic diastolic congestive heart failure ([name])   PNA (pneumonia)   Dementia without behavioral disturbance  Elevation of troponin, denies chest pain, no sob, bp stable, cardiology consulted, advised conservative management with adding plavix and imdur. Echocardiogram adequate EF, no wall motion abnormality. Outpatient cardiology follow up  Confusion, vs baseline dementia, seems at baseline.  QTc prolongation, QTc on admission 625ms, resolved, repeat ckg QTc 422ms,  Mag/tsh wnl, avoid Qt prolonging agent.  H/o parkinsons: continue home meds  Elevated am blood sugar with a1c 5.6 in 09/2015, elevated blood sugar likely steroid induced,. Diet control, outpatient monitor blood sugar. For some reason patient has been on chronic prednisone, review chart, patient has been on prednisone 10mg  qd then 5mg  po qd then 2.5 mg po qd for the last year.  Bacteruria; no symptom, urine culture inconclusive, with chronic indwelling foley.  Recent discharged on 10/18 due to pneumonia, continue augmentin to finish treatment course, repeat cxr 2view likely atelectasis, incentive spirometer.  During last hospitalization from 10/13 to 10/17, patient had swallow eval done, diet changed to dysphagia diet with puree diet and honey thick liquid due to risk of aspiration, periodic swallow eval at [facility] recommended.  Code Status: full  Family Communication: patient   Disposition Plan: SNF10/20   Consultants:  cardiology  Procedures:  none  Antibiotics:  Oral augmentin   Discharge Exam: BP 116/66 mmHg  Pulse 65  Temp(Src) 99.2 F (37.3 C) (Oral)  Resp 20  Ht 5\' 11"  (1.803 m)  Wt 157 lb 1.6  oz (71.26 kg)  BMI 21.92 kg/m2  SpO2 94%  General: aaox3, impaired short term memory Cardiovascular: RRR Respiratory: CTABL Ab soft/NT/ND, chronic indwelling foley with clear urine  Discharge Instructions You were cared for by a hospitalist during your hospital stay. If you have any questions about your discharge medications or the care you received while you were in the hospital after you are discharged, you can call the unit and asked to speak with the hospitalist on call if the hospitalist that took care of you is not available. Once you are discharged, your primary care physician will handle any further medical issues. Please note that NO REFILLS for any discharge medications will be authorized once you are discharged, as it is imperative that you return to your primary care physician (or establish a relationship with a primary care physician if you do not have one) for your aftercare needs so that they can reassess your need for medications and monitor your lab values.      Discharge Instructions    Diet - low sodium heart healthy    Complete by:  As directed   Puree diet and honey thick liquid, aspiration precaution     Increase activity slowly    Complete by:  As directed             Medication List    STOP taking these medications        amLODipine 5 MG tablet  Commonly known as:  NORVASC     amoxicillin-clavulanate 500-125 MG tablet  Commonly known as:  AUGMENTIN  Replaced by:  amoxicillin-clavulanate 875-125 MG tablet     ipratropium-albuterol 0.5-2.5 (3) MG/3ML Soln  Commonly known as:  DUONEB     memantine tablet pack  Commonly known as:  NAMENDA TITRATION PAK      TAKE these medications        acetaminophen 325 MG tablet  Commonly known as:  TYLENOL  Take 650 mg by mouth every 6 (six) hours as needed for mild pain.     albuterol (2.5 MG/3ML) 0.083% nebulizer solution  Commonly known as:  PROVENTIL  Take 3 mLs (2.5 mg total) by nebulization every 2 (two)  hours as needed for wheezing or shortness of breath.     amoxicillin-clavulanate 875-125 MG tablet  Commonly known as:  AUGMENTIN  Take 1 tablet by mouth 2 (two) times daily.     aspirin 81 MG chewable tablet  Chew 81 mg by mouth daily.     bisacodyl 10 MG suppository  Commonly known as:  DULCOLAX  Place 10 mg rectally daily as needed for moderate constipation.     carbidopa-levodopa 25-250 MG tablet  Commonly known as:  SINEMET IR  Take 1 tablet by mouth 3 (three) times daily.     clonazePAM 0.5 MG tablet  Commonly known as:  KLONOPIN  Take 0.25-0.5 mg by mouth 2 (two) times daily. Take 0.25mg  at noon and 0.5mg  at bedtime     clopidogrel 75 MG tablet  Commonly known as:  PLAVIX  Take 1 tablet (75 mg total) by mouth daily.     dimethicone-zinc oxide cream  Apply topically 2 (two) times daily as needed for dry skin.     entacapone 200 MG tablet  Commonly known as:  COMTAN  Take 200 mg by mouth 3 (three) times daily.     famotidine 20 MG tablet  Commonly known as:  PEPCID  Take 20 mg by mouth 2 (two) times daily.     GLUCERNA Liqd  Take 237 mLs by mouth daily.     hydrALAZINE 25 MG tablet  Commonly known as:  APRESOLINE  Take 25 mg by mouth 2 (two) times daily.     isosorbide mononitrate 30 MG 24 hr tablet  Commonly known as:  IMDUR  Take 1 tablet (30 mg total) by mouth daily.     lamoTRIgine 150 MG tablet  Commonly known as:  LAMICTAL  Take 1 tablet (150 mg total) by mouth 2 (two) times daily.     lisinopril 10 MG tablet  Commonly known as:  PRINIVIL,ZESTRIL  Take 0.5 tablets (5 mg total) by mouth daily.     loratadine 10 MG tablet  Commonly known as:  CLARITIN  Take 10 mg by mouth daily as needed for allergies.     multivitamin with minerals Tabs tablet  Take 1 tablet by mouth daily.     predniSONE 2.5 MG tablet  Commonly known as:  DELTASONE  Take 2.5 mg by mouth daily with breakfast.     QUEtiapine 25 MG tablet  Commonly known as:  SEROQUEL  Take  75 mg by mouth at bedtime.     RESOURCE THICKENUP CLEAR Powd  Take 120 g by mouth as needed.     sertraline 100 MG tablet  Commonly known as:  ZOLOFT  Take 2 tablets (200 mg total) by mouth daily.     traZODone 100 MG tablet  Commonly known as:  DESYREL  Take 1 tablet (100 mg total) by mouth at bedtime.       Allergies  Allergen Reactions  . Cholestatin Other (See Comments)    Per MAR   Follow-up Information    Follow up with Warren Danes, MD On 10/29/2015.   Specialty:  Cardiology   Why:  with Ellen Henri, PA for Dr. Mare Ferrari at 10:00 AM   Contact information:   [address] [place] [address] [phone]        The results of significant diagnostics from this hospitalization (including imaging, microbiology, ancillary and laboratory) are listed below for reference.    Significant Diagnostic Studies: Dg Chest 2 View  10/05/2015  CLINICAL DATA:  Pneumonia. Fall yesterday. Altered mental status. Parkinson' s disease. EXAM: CHEST  2 VIEW COMPARISON:  09/30/2015 FINDINGS: Mild enlargement of the cardiopericardial silhouette with bandlike opacity above the knee left hemidiaphragm and also just above the right minor fissure. Mild indistinctness of pulmonary vasculature. Atherosclerotic calcification of the aortic arch. Severe degenerative glenohumeral arthropathy bilaterally with associated bilateral humeral head volume loss. Probable free fragments in the left glenohumeral joint. Left rib deformities, chronic and stable, from healed fractures. Low lung volumes are present, causing crowding of the pulmonary vasculature. No pleural effusion. Lumbar spondylosis. IMPRESSION: 1. Bandlike opacities at the left lung base and right mid lung, probably from atelectasis given their configuration. Bilateral perihilar interstitial accentuation and airway thickening could be from atypical pneumonia or mild interstitial edema. 2. Mild enlargement of the cardiopericardial  silhouette 3. Severe bilateral degenerative glenohumeral arthropathy. Electronically Signed   By: Van Clines M.D.   On: 10/05/2015 15:58   Ct Head Wo Contrast  10/05/2015  CLINICAL DATA:  Altered mental status.  Recent pneumonia and fall. EXAM: CT  HEAD WITHOUT CONTRAST TECHNIQUE: Contiguous axial images were obtained from the base of the skull through the vertex without intravenous contrast. COMPARISON:  08/25/2015 head CT. FINDINGS: No evidence of parenchymal hemorrhage or extra-axial fluid collection. No mass lesion, mass effect, or midline shift. No CT evidence of acute infarction. Re- demonstrated is intracranial atherosclerotic arterial calcification and moderate diffuse cerebral volume loss. Nonspecific stable mild subcortical and periventricular white matter hypodensity, most in keeping with chronic small vessel ischemic change. Cerebral ventricle sizes are stable and concordant with the degree of cerebral volume loss. 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 There is mild partial opacification of the bilateral ethmoidal air cells. No fluid levels in the visualized paranasal sinuses. The mastoid air cells are unopacified. No evidence of calvarial fracture. IMPRESSION: 1.  No evidence of acute intracranial abnormality. 2. Stable intracranial atherosclerosis, diffuse cerebral volume loss and mild chronic small vessel ischemic white matter change. Electronically Signed   By: Ilona Sorrel M.D.   On: 10/05/2015 16:37   Dg Chest Port 1 View  09/30/2015  CLINICAL DATA:  Shortness of breath with decreased oxygen saturation EXAM: PORTABLE CHEST 1 VIEW COMPARISON:  March 16, 2015 FINDINGS: There is airspace consolidation throughout the right mid and lower lung zones. Left lung is clear. Heart is upper normal in size with pulmonary vascularity within normal limits. No appreciable adenopathy. There old healed rib fractures on the left. There is marked arthropathy in each shoulder. IMPRESSION: Areas of  airspace consolidation in the right mid lower lung zones, most consistent with pneumonia. Lungs elsewhere clear. No change in cardiac silhouette. Followup PA and lateral chest radiographs recommended in 3-4 weeks following trial of antibiotic therapy to ensure resolution and exclude underlying malignancy. Electronically Signed   By: Lowella Grip III M.D.   On: 09/30/2015 07:50    Microbiology: Recent Results (from the past 240 hour(s))  Blood Culture (routine x 2)     Status: None   Collection Time: 09/30/15  7:40 AM  Result Value Ref Range Status   Specimen Description BLOOD RIGHT ANTECUBITAL  Final   Special Requests BOTTLES DRAWN AEROBIC AND ANAEROBIC 5ML  Final   Culture  Setup Time   Final    GRAM NEGATIVE RODS IN BOTH AEROBIC AND ANAEROBIC BOTTLES CRITICAL RESULT CALLED TO, READ BACK BY AND VERIFIED WITH: B DAVIS RN 2229 09/30/15 A BROWNING    Culture   Final    PROTEUS MIRABILIS SUSCEPTIBILITIES PERFORMED ON PREVIOUS CULTURE WITHIN THE LAST 5 DAYS. Performed at [facility]    Report Status 10/02/2015 FINAL  Final  Blood Culture (routine x 2)     Status: None   Collection Time: 09/30/15  7:45 AM  Result Value Ref Range Status   Specimen Description BLOOD LEFT ANTECUBITAL  Final   Special Requests BOTTLES DRAWN AEROBIC AND ANAEROBIC 5CC  Final   Culture  Setup Time   Final    GRAM NEGATIVE RODS ANAEROBIC BOTTLE ONLY CRITICAL RESULT CALLED TO, READ BACK BY AND VERIFIED WITH: B DAVIS RN 2309 09/30/15 A BROWNING    Culture   Final    PROTEUS MIRABILIS Performed at [facility]    Report Status 10/02/2015 FINAL  Final   Organism ID, Bacteria PROTEUS MIRABILIS  Final      Susceptibility   Proteus mirabilis - MIC*    AMPICILLIN <=2 SENSITIVE Sensitive     CEFAZOLIN <=4 SENSITIVE Sensitive     CEFEPIME <=1 SENSITIVE Sensitive     CEFTAZIDIME <=1  SENSITIVE Sensitive     CEFTRIAXONE <=1 SENSITIVE Sensitive     CIPROFLOXACIN <=0.25 SENSITIVE Sensitive      GENTAMICIN <=1 SENSITIVE Sensitive     IMIPENEM 2 SENSITIVE Sensitive     TRIMETH/SULFA <=20 SENSITIVE Sensitive     AMPICILLIN/SULBACTAM <=2 SENSITIVE Sensitive     PIP/TAZO <=4 SENSITIVE Sensitive     * PROTEUS MIRABILIS  Urine culture     Status: None   Collection Time: 09/30/15 10:05 AM  Result Value Ref Range Status   Specimen Description URINE, CATHETERIZED  Final   Special Requests NONE  Final   Culture   Final    MULTIPLE SPECIES PRESENT, SUGGEST RECOLLECTION Performed at [facility]    Report Status 10/01/2015 FINAL  Final  MRSA PCR Screening     Status: Abnormal   Collection Time: 09/30/15 11:05 AM  Result Value Ref Range Status   MRSA by PCR POSITIVE (A) NEGATIVE Final    Comment:        The GeneXpert MRSA Assay (FDA approved for NASAL specimens only), is one component of a comprehensive MRSA colonization surveillance program. It is not intended to diagnose MRSA infection nor to guide or monitor treatment for MRSA infections. RESULT CALLED TO, READ BACK BY AND VERIFIED WITH: CAROLYN CREECH,RN [id number] @ [address]   Culture, blood (routine x 2)     Status: None   Collection Time: 10/01/15  2:45 PM  Result Value Ref Range Status   Specimen Description BLOOD LEFT ARM  Final   Special Requests BOTTLES DRAWN AEROBIC AND ANAEROBIC 10 CC EA  Final   Culture   Final    NO GROWTH 5 DAYS Performed at [facility]    Report Status 10/06/2015 FINAL  Final  Culture, blood (routine x 2)     Status: None   Collection Time: 10/01/15  2:55 PM  Result Value Ref Range Status   Specimen Description BLOOD RIGHT HAND  Final   Special Requests BOTTLES DRAWN AEROBIC AND ANAEROBIC 8 CC EA  Final   Culture   Final    NO GROWTH 5 DAYS Performed at [facility]    Report Status 10/06/2015 FINAL  Final  Urine culture     Status: None   Collection Time: 10/01/15  3:01 PM  Result Value Ref Range Status   Specimen Description URINE, CATHETERIZED  Final     Special Requests NONE  Final   Culture   Final    NO GROWTH 2 DAYS Performed at [facility]    Report Status 10/03/2015 FINAL  Final  Urine culture     Status: None   Collection Time: 10/05/15  3:55 PM  Result Value Ref Range Status   Specimen Description URINE, CATHETERIZED  Final   Special Requests NONE  Final   Culture   Final    MULTIPLE SPECIES PRESENT, SUGGEST RECOLLECTION Performed at [facility]    Report Status 10/06/2015 FINAL  Final     Labs: Basic Metabolic Panel:  Recent Labs Lab 10/03/15 0535 10/04/15 0416 10/05/15 1524 10/06/15 0630 10/07/15 0432  NA 139 141 143 143 142  K 3.8 4.1 4.0 3.9 4.1  CL 106 107 109 110 109  CO2 25 28 27 28 28   GLUCOSE 126* 112* 111* 96 98  BUN 29* 31* 35* 29* 26*  CREATININE 1.17 1.20 1.16 1.20 1.15  CALCIUM 8.7* 8.6* 8.6* 8.5* 8.4*  MG  --   --   --   --  2.2   Liver Function Tests:  Recent Labs Lab 10/05/15 1524 10/07/15 0432  AST 85* 123*  ALT 8* 34  ALKPHOS 78 73  BILITOT 0.6 0.7  PROT 5.5* 5.0*  ALBUMIN 3.0* 2.7*   No results for input(s): LIPASE, AMYLASE in the last 168 hours. No results for input(s): AMMONIA in the last 168 hours. CBC:  Recent Labs Lab 10/03/15 0535 10/04/15 0416 10/05/15 1524 10/06/15 0630 10/07/15 0432  WBC 8.0 8.4 11.7* 8.7 12.1*  NEUTROABS  --   --  9.3*  --  8.9*  HGB 12.0* 11.3* 10.9* 10.7* 10.4*  HCT 37.0* 35.0* 33.9* 33.2* 32.6*  MCV 89.6 89.5 89.2 91.7 92.1  PLT 209 197 218 216 258   Cardiac Enzymes:  Recent Labs Lab 10/05/15 1848 10/06/15 0033 10/06/15 0630  TROPONINI 0.25* 0.24* 0.20*   BNP: BNP (last 3 results)  Recent Labs  09/30/15 0740 10/05/15 1524  BNP 85.5 376.6*    ProBNP (last 3 results) No results for input(s): PROBNP in the last 8760 hours.  CBG:  Recent Labs Lab 10/06/15 1201 10/06/15 1642 10/06/15 1953 10/07/15 0750 10/07/15 1149  GLUCAP 144* 95 147* 112* 133*       Signed:  Tommi Crepeau MD, PhD  [facility] 10/07/2015, 12:55 PM

## 2015-10-07 NOTE — Progress Notes (Signed)
PT Cancellation Note  Patient Details Name: ALFERD OBRYANT MRN: [id number] DOB: 18-Feb-1935   Cancelled Treatment:    Reason Eval/Treat Not Completed: PT screened, no needs identified, will sign off Per RN, d/c back to SNF today.   Robin Pafford,KATHrine E 10/07/2015, 10:24 AM Carmelia Bake, PT, DPT 10/07/2015 Pager: [phone]

## 2015-10-29 ENCOUNTER — Ambulatory Visit (INDEPENDENT_AMBULATORY_CARE_PROVIDER_SITE_OTHER): Payer: Medicare Other | Admitting: Cardiology

## 2015-10-29 ENCOUNTER — Encounter: Payer: Self-pay | Admitting: Cardiology

## 2015-10-29 VITALS — BP 140/76 | HR 68 | Ht 70.0 in | Wt 160.8 lb

## 2015-10-29 DIAGNOSIS — R9431 Abnormal electrocardiogram [ECG] [EKG]: Secondary | ICD-10-CM | POA: Diagnosis not present

## 2015-10-29 NOTE — Progress Notes (Signed)
10/29/2015 [place]   06-17-1935  [id number]  Primary Physician No primary care provider on file. Primary Cardiologist: Dr. Mare Ferrari Dr. Acie Fredrickson   Reason for Visit/CC: [facility] F/u for Abnormal Troponin/ Abnormal EKG  HPI:  80 year old gentleman with no prior cardiac history. Has a h/o parkinsonism and confusion. He was recently hospitalized 10/13-10/17 for HCAP. Discharged on Augmentin. Returned to the emergency room from nursing home with worsening confusion and history of falls. Noted to have slightly elevated troponin. We were consulted for recommendations. We cycled his enzymes, which remained with flat low level trend, 0.25-->0.24-->0.20. He denied CP and dyspnea. BNP was normal at 85. CXR findings were not c/w edema. A 2D echo was obtained which revealed normal LVF with an estimated EF of 55-60%. Wall motion was normal. G1DD was noted. A small pericardial effusion was identified circumferential to the heart. There was no evidence of hemodynamic compromise. EKG showed lateral TWI. However given his dementia, parkinsons and  relatively poor health at baseline, Dr. Acie Fredrickson recommended conservative management and no ischemic w/u was conducted. He was placed on Plavix and Imdur.   He presents to clinic today for hospital follow-up. He continues to deny any chest pain or dyspnea. No lightheadedness, syncope/ near syncope. He exercise daily on a stationary bike for 45 min w/o symptoms or limitations.  Vital signs are stable. BP is well controlled.      Current Outpatient Prescriptions  Medication Sig Dispense Refill  . acetaminophen (TYLENOL) 325 MG tablet Take 650 mg by mouth every 6 (six) hours as needed for mild pain.    Marland Kitchen albuterol (PROVENTIL) (2.5 MG/3ML) 0.083% nebulizer solution Take 3 mLs (2.5 mg total) by nebulization every 2 (two) hours as needed for wheezing or shortness of breath. 75 mL 12  . amoxicillin-clavulanate (AUGMENTIN) 875-125 MG tablet Take 1 tablet by mouth  2 (two) times daily. 16 tablet 0  . aspirin 81 MG chewable tablet Chew 81 mg by mouth daily.    . bisacodyl (DULCOLAX) 10 MG suppository Place 10 mg rectally daily as needed for moderate constipation.    . carbidopa-levodopa (SINEMET IR) 25-250 MG per tablet Take 1 tablet by mouth 3 (three) times daily.    . clonazePAM (KLONOPIN) 0.5 MG tablet Take 0.25-0.5 mg by mouth 2 (two) times daily. Take 0.25mg  at noon and 0.5mg  at bedtime    . clopidogrel (PLAVIX) 75 MG tablet Take 1 tablet (75 mg total) by mouth daily. 30 tablet 0  . entacapone (COMTAN) 200 MG tablet Take 200 mg by mouth 3 (three) times daily.    . famotidine (PEPCID) 20 MG tablet Take 20 mg by mouth 2 (two) times daily.    Marland Kitchen GLUCERNA (GLUCERNA) LIQD Take 237 mLs by mouth daily.     . hydrALAZINE (APRESOLINE) 25 MG tablet Take 25 mg by mouth 2 (two) times daily.     . isosorbide mononitrate (IMDUR) 30 MG 24 hr tablet Take 1 tablet (30 mg total) by mouth daily. 30 tablet 0  . lamoTRIgine (LAMICTAL) 150 MG tablet Take 1 tablet (150 mg total) by mouth 2 (two) times daily. 60 tablet 2  . lisinopril (PRINIVIL,ZESTRIL) 10 MG tablet Take 0.5 tablets (5 mg total) by mouth daily. 30 tablet 0  . loratadine (CLARITIN) 10 MG tablet Take 10 mg by mouth daily as needed for allergies.    . Maltodextrin-Xanthan Gum (RESOURCE THICKENUP CLEAR) POWD Take 120 g by mouth as needed. 30 Can 0  . Multiple Vitamin (MULTIVITAMIN WITH  MINERALS) TABS tablet Take 1 tablet by mouth daily.    . predniSONE (DELTASONE) 2.5 MG tablet Take 2.5 mg by mouth daily with breakfast.    . QUEtiapine (SEROQUEL) 25 MG tablet Take 75 mg by mouth at bedtime.    . sertraline (ZOLOFT) 100 MG tablet Take 2 tablets (200 mg total) by mouth daily. 60 tablet 1  . Skin Protectants, Misc. (DIMETHICONE-ZINC OXIDE) cream Apply topically 2 (two) times daily as needed for dry skin.    Marland Kitchen traZODone (DESYREL) 100 MG tablet Take 1 tablet (100 mg total) by mouth at bedtime. 30 tablet 2   No current  facility-administered medications for this visit.    Allergies  Allergen Reactions  . Cholestatin Other (See Comments)    Per MAR    Social History   Social History  . Marital Status: Divorced    Spouse Name: N/A  . Number of Children: 2  . Years of Education: college 1   Occupational History  . Retired    Social History Main Topics  . Smoking status: Never Smoker   . Smokeless tobacco: Never Used  . Alcohol Use: No     Comment: occasional  . Drug Use: No  . Sexual Activity: Not Currently   Other Topics Concern  . Not on file   Social History Narrative   Patient is right handed.   Patient drinks 1 cup of caffeine daily.     Review of Systems: General: negative for chills, fever, night sweats or weight changes.  Cardiovascular: negative for chest pain, dyspnea on exertion, edema, orthopnea, palpitations, paroxysmal nocturnal dyspnea or shortness of breath Dermatological: negative for rash Respiratory: negative for cough or wheezing Urologic: negative for hematuria Abdominal: negative for nausea, vomiting, diarrhea, bright red blood per rectum, melena, or hematemesis Neurologic: negative for visual changes, syncope, or dizziness All other systems reviewed and are otherwise negative except as noted above.    Blood pressure 140/76, pulse 68, height 5\' 10"  (1.778 m), weight 160 lb 12.8 oz (72.938 kg).  General appearance: alert, cooperative and no distress Neck: no carotid bruit and no JVD Lungs: clear to auscultation bilaterally Heart: regular rate and rhythm, S1, S2 normal, no murmur, click, rub or gallop Extremities: no LEE Pulses: 2+ and symmetric Skin: warm and dry Neurologic: Grossly normal  EKG not performed  ASSESSMENT AND PLAN:   Patient appears to be doing well post hospital discharge. He is known to have an abnormal EKG however he has remained asymptomatic w/o CP or dyspnea. Also no symptoms or limitations with exercise. Given his lack of symptoms  and dementia, parkinsons and  relatively poor health at baseline, we have elected to continue conservative medical therapy. No invasive w/u at this time. He has tolerated Plavix and Imdur well w/o side effects. He has been instructed to f/u on a yearly basis. He was seen both by Dr. Mare Ferrari and Dr. Acie Fredrickson during his recent hospitalization. We will have him f/u with Dr. Acie Fredrickson in 1 year, since Dr. Meridee Score will be retiring. He was advised to f/u sooner if development of any symptoms.   Lyda Jester PA-C 10/29/2015 10:55 AM

## 2015-10-29 NOTE — Patient Instructions (Signed)
Medication Instructions:   Your physician recommends that you continue on your current medications as directed. Please refer to the Current Medication list given to you today.    If you need a refill on your cardiac medications before your next appointment, please call your pharmacy.  Labwork: NONE ORDER TODAY    Testing/Procedures: NONE ORDER TODAY    Follow-Up:  Your physician wants you to follow-up in: [place] will receive a reminder letter in the mail two months in advance. If you don't receive a letter, please call our office to schedule the follow-up appointment.     Any Other Special Instructions Will Be Listed Below (If Applicable).

## 2015-12-02 ENCOUNTER — Ambulatory Visit (HOSPITAL_COMMUNITY): Payer: Self-pay | Admitting: Psychiatry

## 2015-12-07 ENCOUNTER — Ambulatory Visit (INDEPENDENT_AMBULATORY_CARE_PROVIDER_SITE_OTHER): Payer: Medicare Other | Admitting: Neurology

## 2015-12-07 ENCOUNTER — Encounter: Payer: Self-pay | Admitting: Neurology

## 2015-12-07 VITALS — BP 138/66 | HR 60 | Ht 71.0 in | Wt 165.5 lb

## 2015-12-07 DIAGNOSIS — R413 Other amnesia: Secondary | ICD-10-CM

## 2015-12-07 DIAGNOSIS — G20 Parkinson's disease: Secondary | ICD-10-CM

## 2015-12-07 DIAGNOSIS — R269 Unspecified abnormalities of gait and mobility: Secondary | ICD-10-CM

## 2015-12-07 NOTE — Progress Notes (Signed)
Reason for visit: Gait disorder  [name] is an 80 y.o. male  History of present illness:  [name] is a 80 year old right-handed white male with a history of parkinsonism, gait disorder, and a memory disorder. The patient has a history of bipolar disorder. The patient has been relatively stable since last seen. He is on medications for Parkinson's disease, he mainly uses a wheelchair while inside the facility, if he is with someone, he can walk with a walker. He has possibly had one fall since last seen. He exercises on a regular basis. He uses a recumbent bike for exercise daily. When last seen, he was placed on Namenda for memory, it is not clear that he is still on the medication. He returns for an evaluation. He denies any other significant medical issues that have come up since last seen.  Past Medical History  Diagnosis Date  . Hearing aid worn   . Hypertension   . Parkinson disease ([place])   . Pneumonia   . Diabetes mellitus type II   . Arthritis   . Bipolar disorder ([place])   . Anxiety   . Depression   . MRSA infection (methicillin-resistant Staphylococcus aureus)   . Chronic indwelling Foley catheter   . C2 cervical fracture ([name])     due to pt fall  . SIRS (systemic inflammatory response syndrome) ([name])   . Memory loss   . Benign prostate hyperplasia   . Memory difficulty 08/21/2014  . Neuromuscular disorder ([place])     parkinsons  . Flu   . Falls   . UTI (urinary tract infection)     Past Surgical History  Procedure Laterality Date  . Total hip arthroplasty    . Video bronchoscopy  12/16/2012    Procedure: VIDEO BRONCHOSCOPY;  Surgeon: Melrose Nakayama, MD;  Location: [facility];  Service: Thoracic;  Laterality: Right;  . Video assisted thoracoscopy (vats)/empyema  12/16/2012    Procedure: VIDEO ASSISTED THORACOSCOPY (VATS)/EMPYEMA;  Surgeon: Melrose Nakayama, MD;  Location: [facility];  Service: Thoracic;  Laterality: Right;    Family History  Problem  Relation Age of Onset  . Depression Father   . Cancer Mother   . Heart disease Mother     Social history:  reports that he has never smoked. He has never used smokeless tobacco. He reports that he does not drink alcohol or use illicit drugs.    Allergies  Allergen Reactions  . Cholestatin Other (See Comments)    Per MAR    Medications:  Prior to Admission medications   Medication Sig Start Date End Date Taking? Authorizing Provider  acetaminophen (TYLENOL) 325 MG tablet Take 650 mg by mouth every 6 (six) hours as needed for mild pain.   Yes Historical Provider, MD  albuterol (PROVENTIL) (2.5 MG/3ML) 0.083% nebulizer solution Take 3 mLs (2.5 mg total) by nebulization every 2 (two) hours as needed for wheezing or shortness of breath. 04/21/14  Yes Hosie Poisson, MD  amoxicillin-clavulanate (AUGMENTIN) 875-125 MG tablet Take 1 tablet by mouth 2 (two) times daily. 10/07/15  Yes Florencia Reasons, MD  aspirin 81 MG chewable tablet Chew 81 mg by mouth daily.   Yes Historical Provider, MD  bisacodyl (DULCOLAX) 10 MG suppository Place 10 mg rectally daily as needed for moderate constipation.   Yes Historical Provider, MD  carbidopa-levodopa (SINEMET IR) 25-250 MG per tablet Take 1 tablet by mouth 3 (three) times daily.   Yes Historical Provider, MD  clonazePAM (KLONOPIN) 0.5  MG tablet Take 0.25-0.5 mg by mouth 2 (two) times daily. Take 0.25mg  at noon and 0.5mg  at bedtime   Yes Historical Provider, MD  clopidogrel (PLAVIX) 75 MG tablet Take 1 tablet (75 mg total) by mouth daily. 10/07/15  Yes Florencia Reasons, MD  entacapone (COMTAN) 200 MG tablet Take 200 mg by mouth 3 (three) times daily.   Yes Historical Provider, MD  famotidine (PEPCID) 20 MG tablet Take 20 mg by mouth 2 (two) times daily.   Yes Historical Provider, MD  GLUCERNA (GLUCERNA) LIQD Take 237 mLs by mouth daily.    Yes Historical Provider, MD  hydrALAZINE (APRESOLINE) 25 MG tablet Take 25 mg by mouth 2 (two) times daily.    Yes Historical Provider, MD    isosorbide mononitrate (IMDUR) 30 MG 24 hr tablet Take 1 tablet (30 mg total) by mouth daily. 10/07/15  Yes Florencia Reasons, MD  lamoTRIgine (LAMICTAL) 150 MG tablet Take 1 tablet (150 mg total) by mouth 2 (two) times daily. 12/02/14  Yes Kathlee Nations, MD  lisinopril (PRINIVIL,ZESTRIL) 10 MG tablet Take 0.5 tablets (5 mg total) by mouth daily. 10/07/15  Yes Florencia Reasons, MD  loratadine (CLARITIN) 10 MG tablet Take 10 mg by mouth daily as needed for allergies.   Yes Historical Provider, MD  Maltodextrin-Xanthan Gum (RESOURCE THICKENUP CLEAR) POWD Take 120 g by mouth as needed. 10/07/15  Yes Florencia Reasons, MD  Multiple Vitamin (MULTIVITAMIN WITH MINERALS) TABS tablet Take 1 tablet by mouth daily.   Yes Historical Provider, MD  predniSONE (DELTASONE) 2.5 MG tablet Take 2.5 mg by mouth daily with breakfast.   Yes Historical Provider, MD  QUEtiapine (SEROQUEL) 25 MG tablet Take 75 mg by mouth at bedtime.   Yes Historical Provider, MD  sertraline (ZOLOFT) 100 MG tablet Take 2 tablets (200 mg total) by mouth daily. 10/01/14  Yes Kathlee Nations, MD  Skin Protectants, Misc. (DIMETHICONE-ZINC OXIDE) cream Apply topically 2 (two) times daily as needed for dry skin.   Yes Historical Provider, MD  traZODone (DESYREL) 100 MG tablet Take 1 tablet (100 mg total) by mouth at bedtime. 12/02/14  Yes Kathlee Nations, MD    ROS:  Out of a complete 14 system review of symptoms, the patient complains only of the following symptoms, and all other reviewed systems are negative.  Bruising easily Gait instability Memory disorder  Blood pressure 138/66, pulse 60, height 5\' 11"  (1.803 m), weight 165 lb 8 oz (75.07 kg).  Physical Exam  General: The patient is alert and cooperative at the time of the examination.  Skin: No significant peripheral edema is noted.   Neurologic Exam  Mental status: The patient is alert and oriented x 3 at the time of the examination. The patient has apparent normal recent and remote memory, with an  apparently normal attention span and concentration ability. Mini-Mental Status Examination done today shows a total score of 26/30. The patient is able to name 5 animals in 30 seconds.   Cranial nerves: Facial symmetry is present. Speech is normal, no aphasia or dysarthria is noted. Extraocular movements are full. Visual fields are full.  Motor: The patient has good strength in all 4 extremities.  Sensory examination: Soft touch sensation is symmetric on the face, arms, and legs.  Coordination: The patient has good finger-nose-finger and heel-to-shin bilaterally. There appears be some apraxia with the use of the lower extremities, however.  Gait and station: The patient is able to ambulate independently, he usually uses a walker. The  patient has fairly good stride, turns. The patient has a negative Romberg. Tandem gait was not attempted.  Reflexes: Deep tendon reflexes are symmetric.   Assessment/Plan:  1. Parkinsonism  2. Gait disorder  3. Memory disorder  4. Bipolar disorder  The patient is remarkably stable. I will not alter medications at this time. He is to continue his current level of activity. He is remaining relatively safe. Will follow-up in about 6 months.  Jill Alexanders MD 12/07/2015 7:26 PM  [facility] [address] [place], [place] [address]  Phone [phone] Fax [phone]

## 2015-12-07 NOTE — Patient Instructions (Signed)
Fall Prevention in the Home  Falls can cause injuries and can affect people from all age groups. There are many simple things that you can do to make your home safe and to help prevent falls. WHAT CAN I DO ON THE OUTSIDE OF MY HOME?  Regularly repair the edges of walkways and driveways and fix any cracks.  Remove high doorway thresholds.  Trim any shrubbery on the main path into your home.  Use bright outdoor lighting.  Clear walkways of debris and clutter, including tools and rocks.  Regularly check that handrails are securely fastened and in good repair. Both sides of any steps should have handrails.  Install guardrails along the edges of any raised decks or porches.  Have leaves, snow, and ice cleared regularly.  Use sand or salt on walkways during winter months.  In the garage, clean up any spills right away, including grease or oil spills. WHAT CAN I DO IN THE BATHROOM?  Use night lights.  Install grab bars by the toilet and in the tub and shower. Do not use towel bars as grab bars.  Use non-skid mats or decals on the floor of the tub or shower.  If you need to sit down while you are in the shower, use a plastic, non-slip stool..  Keep the floor dry. Immediately clean up any water that spills on the floor.  Remove soap buildup in the tub or shower on a regular basis.  Attach bath mats securely with double-sided non-slip rug tape.  Remove throw rugs and other tripping hazards from the floor. WHAT CAN I DO IN THE BEDROOM?  Use night lights.  Make sure that a bedside light is easy to reach.  Do not use oversized bedding that drapes onto the floor.  Have a firm chair that has side arms to use for getting dressed.  Remove throw rugs and other tripping hazards from the floor. WHAT CAN I DO IN THE KITCHEN?   Clean up any spills right away.  Avoid walking on wet floors.  Place frequently used items in easy-to-reach places.  If you need to reach for something  above you, use a sturdy step stool that has a grab bar.  Keep electrical cables out of the way.  Do not use floor polish or wax that makes floors slippery. If you have to use wax, make sure that it is non-skid floor wax.  Remove throw rugs and other tripping hazards from the floor. WHAT CAN I DO IN THE STAIRWAYS?  Do not leave any items on the stairs.  Make sure that there are handrails on both sides of the stairs. Fix handrails that are broken or loose. Make sure that handrails are as long as the stairways.  Check any carpeting to make sure that it is firmly attached to the stairs. Fix any carpet that is loose or worn.  Avoid having throw rugs at the top or bottom of stairways, or secure the rugs with carpet tape to prevent them from moving.  Make sure that you have a light switch at the top of the stairs and the bottom of the stairs. If you do not have them, have them installed. WHAT ARE SOME OTHER FALL PREVENTION TIPS?  Wear closed-toe shoes that fit well and support your feet. Wear shoes that have rubber soles or low heels.  When you use a stepladder, make sure that it is completely opened and that the sides are firmly locked. Have someone hold the ladder while you   are using it. Do not climb a closed stepladder.  Add color or contrast paint or tape to grab bars and handrails in your home. Place contrasting color strips on the first and last steps.  Use mobility aids as needed, such as canes, walkers, scooters, and crutches.  Turn on lights if it is dark. Replace any light bulbs that burn out.  Set up furniture so that there are clear paths. Keep the furniture in the same spot.  Fix any uneven floor surfaces.  Choose a carpet design that does not hide the edge of steps of a stairway.  Be aware of any and all pets.  Review your medicines with your healthcare provider. Some medicines can cause dizziness or changes in blood pressure, which increase your risk of falling. Talk  with your health care provider about other ways that you can decrease your risk of falls. This may include working with a physical therapist or trainer to improve your strength, balance, and endurance.   This information is not intended to replace advice given to you by your health care provider. Make sure you discuss any questions you have with your health care provider.   Document Released: 11/24/2002 Document Revised: 04/20/2015 Document Reviewed: 01/08/2015 Elsevier Interactive Patient Education 2016 [employer]  

## 2015-12-30 ENCOUNTER — Ambulatory Visit (INDEPENDENT_AMBULATORY_CARE_PROVIDER_SITE_OTHER): Payer: Medicare Other | Admitting: Psychiatry

## 2015-12-30 ENCOUNTER — Encounter (HOSPITAL_COMMUNITY): Payer: Self-pay | Admitting: Psychiatry

## 2015-12-30 VITALS — BP 130/90 | HR 82 | Ht 70.0 in | Wt 168.2 lb

## 2015-12-30 DIAGNOSIS — F319 Bipolar disorder, unspecified: Secondary | ICD-10-CM

## 2015-12-30 DIAGNOSIS — G3184 Mild cognitive impairment, so stated: Secondary | ICD-10-CM

## 2015-12-30 MED ORDER — LAMOTRIGINE 150 MG PO TABS: 150.0000 mg | ORAL_TABLET | Freq: Two times a day (BID) | ORAL | Status: DC

## 2015-12-30 MED ORDER — CLONAZEPAM 0.5 MG PO TABS: ORAL_TABLET | ORAL | Status: DC

## 2015-12-30 MED ORDER — QUETIAPINE FUMARATE 25 MG PO TABS: 75.0000 mg | ORAL_TABLET | Freq: Every day | ORAL | Status: DC

## 2015-12-30 MED ORDER — TRAZODONE HCL 100 MG PO TABS: 100.0000 mg | ORAL_TABLET | Freq: Every day | ORAL | Status: DC

## 2015-12-30 NOTE — Progress Notes (Signed)
[facility] Progress Note  [name] [id number] 81 y.o.  12/30/2015 4:02 PM  Chief Complaint:  I have been sick and could not come for appointment.  I'm feeling better now.  I'm taking my medication.             History of Present Illness:  [name] came for his appointment with his aide.  He was last seen in December 2015.  He had admitted multiple times on a medical floor due to health reason.  He had pneumonia and complications.  He is still live [facility] and now he has given power of attorney to his health aide.  Patient is very pleased with his care and he feels medicine is working well.  He denies any irritability, anger, crying spells or any paranoia or any hallucination.  Though he still have memory issues but recently he see neurologist and patient told that his neurologist was very pleased with his recovery.  He is taking Lamictal trazodone Klonopin Zoloft and Seroquel and denies any rash, itching, shakes or any tremors.  His appetite is okay.  His vitals are stable.  He has seen multiple physicians in recent months and he has blood work.  He has chronic anemia and his WBC count shows hemoglobin 10.4, hematocrit 32.6.  His basic chemistry shows calcium 8.4, BUN 26 and creatinine 1.15.  His total protein was slightly low.  Despite multiple health issues he endorse his energy level is good.  He is eating well and denies any dizziness, mania or any psychosis.  He wants to continue his current psychiatric medication.  He sleeps all night and he does not want to change his medication.  His vitals are stable.  Suicidal Ideation: No Plan Formed: No Patient has means to carry out plan: No  Homicidal Ideation: No Plan Formed: No Patient has means to carry out plan: No  Review of Systems  HENT: Positive for hearing loss.   Skin: Negative.  Negative for itching and rash.  Neurological: Positive for tremors.       Memory impairment, unsteady gait.   Psychiatric/Behavioral: The patient is nervous/anxious.     Medical history Patient has history of diabetes mellitus, hypertension, Parkinson disease, gait instability, hearing and memory impairment. He has multiple UTIs.  He see Dr. Floyde Parkins.    Outpatient Encounter Prescriptions as of 12/30/2015  Medication Sig  . acetaminophen (TYLENOL) 325 MG tablet Take 650 mg by mouth every 6 (six) hours as needed for mild pain.  Marland Kitchen albuterol (PROVENTIL) (2.5 MG/3ML) 0.083% nebulizer solution Take 3 mLs (2.5 mg total) by nebulization every 2 (two) hours as needed for wheezing or shortness of breath.  Marland Kitchen amoxicillin-clavulanate (AUGMENTIN) 875-125 MG tablet Take 1 tablet by mouth 2 (two) times daily.  Marland Kitchen aspirin 81 MG chewable tablet Chew 81 mg by mouth daily.  . bisacodyl (DULCOLAX) 10 MG suppository Place 10 mg rectally daily as needed for moderate constipation.  . carbidopa-levodopa (SINEMET IR) 25-250 MG per tablet Take 1 tablet by mouth 3 (three) times daily.  . clonazePAM (KLONOPIN) 0.5 MG tablet Take 0.21m at noon and 0.558mat bedtime  . clopidogrel (PLAVIX) 75 MG tablet Take 1 tablet (75 mg total) by mouth daily.  . entacapone (COMTAN) 200 MG tablet Take 200 mg by mouth 3 (three) times daily.  . famotidine (PEPCID) 20 MG tablet Take 20 mg by mouth 2 (two) times daily.  . Marland KitchenLUCERNA (GLUCERNA) LIQD Take 237 mLs by mouth daily.   .Marland Kitchen  hydrALAZINE (APRESOLINE) 25 MG tablet Take 25 mg by mouth 2 (two) times daily.   . isosorbide mononitrate (IMDUR) 30 MG 24 hr tablet Take 1 tablet (30 mg total) by mouth daily.  Marland Kitchen lamoTRIgine (LAMICTAL) 150 MG tablet Take 1 tablet (150 mg total) by mouth 2 (two) times daily.  Marland Kitchen lisinopril (PRINIVIL,ZESTRIL) 10 MG tablet Take 0.5 tablets (5 mg total) by mouth daily.  Marland Kitchen loratadine (CLARITIN) 10 MG tablet Take 10 mg by mouth daily as needed for allergies.  . Maltodextrin-Xanthan Gum (RESOURCE THICKENUP CLEAR) POWD Take 120 g by mouth as needed.  . Multiple Vitamin  (MULTIVITAMIN WITH MINERALS) TABS tablet Take 1 tablet by mouth daily.  . predniSONE (DELTASONE) 2.5 MG tablet Take 2.5 mg by mouth daily with breakfast.  . QUEtiapine (SEROQUEL) 25 MG tablet Take 3 tablets (75 mg total) by mouth at bedtime.  . sertraline (ZOLOFT) 100 MG tablet Take 2 tablets (200 mg total) by mouth daily.  . Skin Protectants, Misc. (DIMETHICONE-ZINC OXIDE) cream Apply topically 2 (two) times daily as needed for dry skin.  Marland Kitchen traZODone (DESYREL) 100 MG tablet Take 1 tablet (100 mg total) by mouth at bedtime.  . [DISCONTINUED] clonazePAM (KLONOPIN) 0.5 MG tablet Take 0.25-0.5 mg by mouth 2 (two) times daily. Take 0.29m at noon and 0.517mat bedtime  . [DISCONTINUED] lamoTRIgine (LAMICTAL) 150 MG tablet Take 1 tablet (150 mg total) by mouth 2 (two) times daily.  . [DISCONTINUED] QUEtiapine (SEROQUEL) 25 MG tablet Take 75 mg by mouth at bedtime.  . [DISCONTINUED] traZODone (DESYREL) 100 MG tablet Take 1 tablet (100 mg total) by mouth at bedtime.   No facility-administered encounter medications on file as of 12/30/2015.   Recent Results (from the past 2160 hour(s))  Glucose, capillary     Status: None   Collection Time: 10/01/15  4:05 PM  Result Value Ref Range   Glucose-Capillary 93 65 - 99 mg/dL   Comment 1 Notify RN    Comment 2 Document in Chart   Glucose, capillary     Status: Abnormal   Collection Time: 10/01/15  9:16 PM  Result Value Ref Range   Glucose-Capillary 164 (H) 65 - 99 mg/dL   Comment 1 Notify RN   CBC     Status: Abnormal   Collection Time: 10/02/15  5:43 AM  Result Value Ref Range   WBC 8.0 4.0 - 10.5 K/uL   RBC 3.60 (L) 4.22 - 5.81 MIL/uL   Hemoglobin 10.3 (L) 13.0 - 17.0 g/dL   HCT 31.8 (L) 39.0 - 52.0 %   MCV 88.3 78.0 - 100.0 fL   MCH 28.6 26.0 - 34.0 pg   MCHC 32.4 30.0 - 36.0 g/dL   RDW 15.8 (H) 11.5 - 15.5 %   Platelets 187 150 - 400 K/uL  Basic metabolic panel     Status: Abnormal   Collection Time: 10/02/15  5:43 AM  Result Value Ref Range    Sodium 139 135 - 145 mmol/L   Potassium 3.7 3.5 - 5.1 mmol/L   Chloride 107 101 - 111 mmol/L   CO2 23 22 - 32 mmol/L   Glucose, Bld 133 (H) 65 - 99 mg/dL   BUN 30 (H) 6 - 20 mg/dL   Creatinine, Ser 1.12 0.61 - 1.24 mg/dL   Calcium 8.4 (L) 8.9 - 10.3 mg/dL   GFR calc non Af Amer >60 >60 mL/min   GFR calc Af Amer >60 >60 mL/min    Comment: (NOTE) The eGFR has been  calculated using the CKD EPI equation. This calculation has not been validated in all clinical situations. eGFR's persistently <60 mL/min signify possible Chronic Kidney Disease.    Anion gap 9 5 - 15  Glucose, capillary     Status: Abnormal   Collection Time: 10/02/15  8:08 AM  Result Value Ref Range   Glucose-Capillary 129 (H) 65 - 99 mg/dL   Comment 1 Notify RN    Comment 2 Document in Chart   Glucose, capillary     Status: Abnormal   Collection Time: 10/02/15 11:52 AM  Result Value Ref Range   Glucose-Capillary 357 (H) 65 - 99 mg/dL   Comment 1 Notify RN    Comment 2 Document in Chart   Glucose, capillary     Status: Abnormal   Collection Time: 10/02/15  1:34 PM  Result Value Ref Range   Glucose-Capillary 146 (H) 65 - 99 mg/dL   Comment 1 Notify RN    Comment 2 Document in Chart   Glucose, capillary     Status: Abnormal   Collection Time: 10/02/15  5:08 PM  Result Value Ref Range   Glucose-Capillary 105 (H) 65 - 99 mg/dL   Comment 1 Notify RN    Comment 2 Document in Chart   Glucose, capillary     Status: Abnormal   Collection Time: 10/02/15  9:56 PM  Result Value Ref Range   Glucose-Capillary 124 (H) 65 - 99 mg/dL  CBC     Status: Abnormal   Collection Time: 10/03/15  5:35 AM  Result Value Ref Range   WBC 8.0 4.0 - 10.5 K/uL   RBC 4.13 (L) 4.22 - 5.81 MIL/uL   Hemoglobin 12.0 (L) 13.0 - 17.0 g/dL   HCT 37.0 (L) 39.0 - 52.0 %   MCV 89.6 78.0 - 100.0 fL   MCH 29.1 26.0 - 34.0 pg   MCHC 32.4 30.0 - 36.0 g/dL   RDW 15.8 (H) 11.5 - 15.5 %   Platelets 209 150 - 400 K/uL  Basic metabolic panel      Status: Abnormal   Collection Time: 10/03/15  5:35 AM  Result Value Ref Range   Sodium 139 135 - 145 mmol/L   Potassium 3.8 3.5 - 5.1 mmol/L   Chloride 106 101 - 111 mmol/L   CO2 25 22 - 32 mmol/L   Glucose, Bld 126 (H) 65 - 99 mg/dL   BUN 29 (H) 6 - 20 mg/dL   Creatinine, Ser 1.17 0.61 - 1.24 mg/dL   Calcium 8.7 (L) 8.9 - 10.3 mg/dL   GFR calc non Af Amer 58 (L) >60 mL/min   GFR calc Af Amer >60 >60 mL/min    Comment: (NOTE) The eGFR has been calculated using the CKD EPI equation. This calculation has not been validated in all clinical situations. eGFR's persistently <60 mL/min signify possible Chronic Kidney Disease.    Anion gap 8 5 - 15  Glucose, capillary     Status: Abnormal   Collection Time: 10/03/15  7:34 AM  Result Value Ref Range   Glucose-Capillary 103 (H) 65 - 99 mg/dL  Glucose, capillary     Status: Abnormal   Collection Time: 10/03/15 11:49 AM  Result Value Ref Range   Glucose-Capillary 229 (H) 65 - 99 mg/dL  Glucose, capillary     Status: Abnormal   Collection Time: 10/03/15  4:37 PM  Result Value Ref Range   Glucose-Capillary 123 (H) 65 - 99 mg/dL  Glucose, capillary  Status: Abnormal   Collection Time: 10/03/15  9:07 PM  Result Value Ref Range   Glucose-Capillary 105 (H) 65 - 99 mg/dL  CBC     Status: Abnormal   Collection Time: 10/04/15  4:16 AM  Result Value Ref Range   WBC 8.4 4.0 - 10.5 K/uL   RBC 3.91 (L) 4.22 - 5.81 MIL/uL   Hemoglobin 11.3 (L) 13.0 - 17.0 g/dL   HCT 35.0 (L) 39.0 - 52.0 %   MCV 89.5 78.0 - 100.0 fL   MCH 28.9 26.0 - 34.0 pg   MCHC 32.3 30.0 - 36.0 g/dL   RDW 15.6 (H) 11.5 - 15.5 %   Platelets 197 150 - 400 K/uL  Basic metabolic panel     Status: Abnormal   Collection Time: 10/04/15  4:16 AM  Result Value Ref Range   Sodium 141 135 - 145 mmol/L   Potassium 4.1 3.5 - 5.1 mmol/L   Chloride 107 101 - 111 mmol/L   CO2 28 22 - 32 mmol/L   Glucose, Bld 112 (H) 65 - 99 mg/dL   BUN 31 (H) 6 - 20 mg/dL   Creatinine, Ser 1.20  0.61 - 1.24 mg/dL   Calcium 8.6 (L) 8.9 - 10.3 mg/dL   GFR calc non Af Amer 56 (L) >60 mL/min   GFR calc Af Amer >60 >60 mL/min    Comment: (NOTE) The eGFR has been calculated using the CKD EPI equation. This calculation has not been validated in all clinical situations. eGFR's persistently <60 mL/min signify possible Chronic Kidney Disease.    Anion gap 6 5 - 15  Glucose, capillary     Status: Abnormal   Collection Time: 10/04/15  7:21 AM  Result Value Ref Range   Glucose-Capillary 107 (H) 65 - 99 mg/dL  Glucose, capillary     Status: Abnormal   Collection Time: 10/04/15 12:17 PM  Result Value Ref Range   Glucose-Capillary 123 (H) 65 - 99 mg/dL  Comprehensive metabolic panel     Status: Abnormal   Collection Time: 10/05/15  3:24 PM  Result Value Ref Range   Sodium 143 135 - 145 mmol/L   Potassium 4.0 3.5 - 5.1 mmol/L   Chloride 109 101 - 111 mmol/L   CO2 27 22 - 32 mmol/L   Glucose, Bld 111 (H) 65 - 99 mg/dL   BUN 35 (H) 6 - 20 mg/dL   Creatinine, Ser 1.16 0.61 - 1.24 mg/dL   Calcium 8.6 (L) 8.9 - 10.3 mg/dL   Total Protein 5.5 (L) 6.5 - 8.1 g/dL   Albumin 3.0 (L) 3.5 - 5.0 g/dL   AST 85 (H) 15 - 41 U/L   ALT 8 (L) 17 - 63 U/L   Alkaline Phosphatase 78 38 - 126 U/L   Total Bilirubin 0.6 0.3 - 1.2 mg/dL   GFR calc non Af Amer 58 (L) >60 mL/min   GFR calc Af Amer >60 >60 mL/min    Comment: (NOTE) The eGFR has been calculated using the CKD EPI equation. This calculation has not been validated in all clinical situations. eGFR's persistently <60 mL/min signify possible Chronic Kidney Disease.    Anion gap 7 5 - 15  CBC with Differential     Status: Abnormal   Collection Time: 10/05/15  3:24 PM  Result Value Ref Range   WBC 11.7 (H) 4.0 - 10.5 K/uL   RBC 3.80 (L) 4.22 - 5.81 MIL/uL   Hemoglobin 10.9 (L) 13.0 - 17.0 g/dL  HCT 33.9 (L) 39.0 - 52.0 %   MCV 89.2 78.0 - 100.0 fL   MCH 28.7 26.0 - 34.0 pg   MCHC 32.2 30.0 - 36.0 g/dL   RDW 15.7 (H) 11.5 - 15.5 %    Platelets 218 150 - 400 K/uL   Neutrophils Relative % 79 %   Neutro Abs 9.3 (H) 1.7 - 7.7 K/uL   Lymphocytes Relative 12 %   Lymphs Abs 1.4 0.7 - 4.0 K/uL   Monocytes Relative 7 %   Monocytes Absolute 0.8 0.1 - 1.0 K/uL   Eosinophils Relative 2 %   Eosinophils Absolute 0.2 0.0 - 0.7 K/uL   Basophils Relative 0 %   Basophils Absolute 0.0 0.0 - 0.1 K/uL  Brain natriuretic peptide     Status: Abnormal   Collection Time: 10/05/15  3:24 PM  Result Value Ref Range   B Natriuretic Peptide 376.6 (H) 0.0 - 100.0 pg/mL  I-stat troponin, ED     Status: Abnormal   Collection Time: 10/05/15  3:32 PM  Result Value Ref Range   Troponin i, poc 0.28 (HH) 0.00 - 0.08 ng/mL   Comment 3            Comment: Due to the release kinetics of cTnI, a negative result within the first hours of the onset of symptoms does not rule out myocardial infarction with certainty. If myocardial infarction is still suspected, repeat the test at appropriate intervals.   I-Stat CG4 Lactic Acid, ED     Status: None   Collection Time: 10/05/15  3:35 PM  Result Value Ref Range   Lactic Acid, Venous 0.98 0.5 - 2.0 mmol/L  Urinalysis, Routine w reflex microscopic     Status: Abnormal   Collection Time: 10/05/15  3:55 PM  Result Value Ref Range   Color, Urine AMBER (A) YELLOW    Comment: BIOCHEMICALS MAY BE AFFECTED BY COLOR   APPearance TURBID (A) CLEAR   Specific Gravity, Urine 1.020 1.005 - 1.030   pH 8.5 (H) 5.0 - 8.0   Glucose, UA NEGATIVE NEGATIVE mg/dL   Hgb urine dipstick NEGATIVE NEGATIVE   Bilirubin Urine NEGATIVE NEGATIVE   Ketones, ur NEGATIVE NEGATIVE mg/dL   Protein, ur 30 (A) NEGATIVE mg/dL   Urobilinogen, UA 0.2 0.0 - 1.0 mg/dL   Nitrite POSITIVE (A) NEGATIVE   Leukocytes, UA MODERATE (A) NEGATIVE  Urine culture     Status: None   Collection Time: 10/05/15  3:55 PM  Result Value Ref Range   Specimen Description URINE, CATHETERIZED    Special Requests NONE    Culture      MULTIPLE SPECIES  PRESENT, SUGGEST RECOLLECTION Performed at [facility]    Report Status 10/06/2015 FINAL   Urine microscopic-add on     Status: Abnormal   Collection Time: 10/05/15  3:55 PM  Result Value Ref Range   Squamous Epithelial / LPF RARE RARE   WBC, UA TOO NUMEROUS TO COUNT <3 WBC/hpf   RBC / HPF TOO NUMEROUS TO COUNT <3 RBC/hpf   Bacteria, UA MANY (A) RARE   Crystals TRIPLE PHOSPHATE CRYSTALS (A) NEGATIVE  Troponin I (q 6hr x 3)     Status: Abnormal   Collection Time: 10/05/15  6:48 PM  Result Value Ref Range   Troponin I 0.25 (H) <0.031 ng/mL    Comment:        PERSISTENTLY INCREASED TROPONIN VALUES IN THE RANGE OF 0.04-0.49 ng/mL CAN BE SEEN IN:       -  UNSTABLE ANGINA       -CONGESTIVE HEART FAILURE       -MYOCARDITIS       -CHEST TRAUMA       -ARRYHTHMIAS       -LATE PRESENTING MYOCARDIAL INFARCTION       -COPD   CLINICAL FOLLOW-UP RECOMMENDED.   Glucose, capillary     Status: Abnormal   Collection Time: 10/05/15 10:06 PM  Result Value Ref Range   Glucose-Capillary 103 (H) 65 - 99 mg/dL  Troponin I (q 6hr x 3)     Status: Abnormal   Collection Time: 10/06/15 12:33 AM  Result Value Ref Range   Troponin I 0.24 (H) <0.031 ng/mL    Comment:        PERSISTENTLY INCREASED TROPONIN VALUES IN THE RANGE OF 0.04-0.49 ng/mL CAN BE SEEN IN:       -UNSTABLE ANGINA       -CONGESTIVE HEART FAILURE       -MYOCARDITIS       -CHEST TRAUMA       -ARRYHTHMIAS       -LATE PRESENTING MYOCARDIAL INFARCTION       -COPD   CLINICAL FOLLOW-UP RECOMMENDED.   Troponin I (q 6hr x 3)     Status: Abnormal   Collection Time: 10/06/15  6:30 AM  Result Value Ref Range   Troponin I 0.20 (H) <0.031 ng/mL    Comment:        PERSISTENTLY INCREASED TROPONIN VALUES IN THE RANGE OF 0.04-0.49 ng/mL CAN BE SEEN IN:       -UNSTABLE ANGINA       -CONGESTIVE HEART FAILURE       -MYOCARDITIS       -CHEST TRAUMA       -ARRYHTHMIAS       -LATE PRESENTING MYOCARDIAL INFARCTION       -COPD    CLINICAL FOLLOW-UP RECOMMENDED.   Basic metabolic panel     Status: Abnormal   Collection Time: 10/06/15  6:30 AM  Result Value Ref Range   Sodium 143 135 - 145 mmol/L   Potassium 3.9 3.5 - 5.1 mmol/L   Chloride 110 101 - 111 mmol/L   CO2 28 22 - 32 mmol/L   Glucose, Bld 96 65 - 99 mg/dL   BUN 29 (H) 6 - 20 mg/dL   Creatinine, Ser 1.20 0.61 - 1.24 mg/dL   Calcium 8.5 (L) 8.9 - 10.3 mg/dL   GFR calc non Af Amer 56 (L) >60 mL/min   GFR calc Af Amer >60 >60 mL/min    Comment: (NOTE) The eGFR has been calculated using the CKD EPI equation. This calculation has not been validated in all clinical situations. eGFR's persistently <60 mL/min signify possible Chronic Kidney Disease.    Anion gap 5 5 - 15  CBC     Status: Abnormal   Collection Time: 10/06/15  6:30 AM  Result Value Ref Range   WBC 8.7 4.0 - 10.5 K/uL   RBC 3.62 (L) 4.22 - 5.81 MIL/uL   Hemoglobin 10.7 (L) 13.0 - 17.0 g/dL   HCT 33.2 (L) 39.0 - 52.0 %   MCV 91.7 78.0 - 100.0 fL   MCH 29.6 26.0 - 34.0 pg   MCHC 32.2 30.0 - 36.0 g/dL   RDW 15.9 (H) 11.5 - 15.5 %   Platelets 216 150 - 400 K/uL  Glucose, capillary     Status: None   Collection Time: 10/06/15  7:43 AM  Result Value Ref  Range   Glucose-Capillary 80 65 - 99 mg/dL  Glucose, capillary     Status: Abnormal   Collection Time: 10/06/15 12:01 PM  Result Value Ref Range   Glucose-Capillary 144 (H) 65 - 99 mg/dL  Glucose, capillary     Status: None   Collection Time: 10/06/15  4:42 PM  Result Value Ref Range   Glucose-Capillary 95 65 - 99 mg/dL  Glucose, capillary     Status: Abnormal   Collection Time: 10/06/15  7:53 PM  Result Value Ref Range   Glucose-Capillary 147 (H) 65 - 99 mg/dL   Comment 1 Notify RN    Comment 2 Document in Chart   CBC with Differential/Platelet     Status: Abnormal   Collection Time: 10/07/15  4:32 AM  Result Value Ref Range   WBC 12.1 (H) 4.0 - 10.5 K/uL    Comment: WHITE COUNT CONFIRMED ON SMEAR   RBC 3.54 (L) 4.22 - 5.81  MIL/uL   Hemoglobin 10.4 (L) 13.0 - 17.0 g/dL   HCT 32.6 (L) 39.0 - 52.0 %   MCV 92.1 78.0 - 100.0 fL   MCH 29.4 26.0 - 34.0 pg   MCHC 31.9 30.0 - 36.0 g/dL   RDW 15.7 (H) 11.5 - 15.5 %   Platelets 258 150 - 400 K/uL    Comment: SPECIMEN CHECKED FOR CLOTS PLATELET COUNT CONFIRMED BY SMEAR    Neutrophils Relative % 74 %   Lymphocytes Relative 18 %   Monocytes Relative 5 %   Eosinophils Relative 3 %   Basophils Relative 0 %   Neutro Abs 8.9 (H) 1.7 - 7.7 K/uL   Lymphs Abs 2.2 0.7 - 4.0 K/uL   Monocytes Absolute 0.6 0.1 - 1.0 K/uL   Eosinophils Absolute 0.4 0.0 - 0.7 K/uL   Basophils Absolute 0.0 0.0 - 0.1 K/uL   WBC Morphology TOXIC GRANULATION   Comprehensive metabolic panel     Status: Abnormal   Collection Time: 10/07/15  4:32 AM  Result Value Ref Range   Sodium 142 135 - 145 mmol/L   Potassium 4.1 3.5 - 5.1 mmol/L   Chloride 109 101 - 111 mmol/L   CO2 28 22 - 32 mmol/L   Glucose, Bld 98 65 - 99 mg/dL   BUN 26 (H) 6 - 20 mg/dL   Creatinine, Ser 1.15 0.61 - 1.24 mg/dL   Calcium 8.4 (L) 8.9 - 10.3 mg/dL   Total Protein 5.0 (L) 6.5 - 8.1 g/dL   Albumin 2.7 (L) 3.5 - 5.0 g/dL   AST 123 (H) 15 - 41 U/L   ALT 34 17 - 63 U/L   Alkaline Phosphatase 73 38 - 126 U/L   Total Bilirubin 0.7 0.3 - 1.2 mg/dL   GFR calc non Af Amer 59 (L) >60 mL/min   GFR calc Af Amer >60 >60 mL/min    Comment: (NOTE) The eGFR has been calculated using the CKD EPI equation. This calculation has not been validated in all clinical situations. eGFR's persistently <60 mL/min signify possible Chronic Kidney Disease.    Anion gap 5 5 - 15  Magnesium     Status: None   Collection Time: 10/07/15  4:32 AM  Result Value Ref Range   Magnesium 2.2 1.7 - 2.4 mg/dL  TSH     Status: None   Collection Time: 10/07/15  4:32 AM  Result Value Ref Range   TSH 1.745 0.350 - 4.500 uIU/mL  Glucose, capillary     Status: Abnormal  Collection Time: 10/07/15  7:50 AM  Result Value Ref Range   Glucose-Capillary 112  (H) 65 - 99 mg/dL  Glucose, capillary     Status: Abnormal   Collection Time: 10/07/15 11:49 AM  Result Value Ref Range   Glucose-Capillary 133 (H) 65 - 99 mg/dL    Past Psychiatric History/Hospitalization(s): Patient has significant history of bipolar disorder.  He has multiple psychiatric admission due to decompensation and noncompliance of medication.  Patient has history of aggression and impulsive behavior.  His last psychiatric admission was in 2005. Anxiety: Yes Bipolar Disorder: Yes Depression: No Mania: No Psychosis: No Schizophrenia: No Personality Disorder: No Hospitalization for psychiatric illness: Yes History of Electroconvulsive Shock Therapy: No Prior Suicide Attempts: No  Physical Exam: Constitutional:  BP 130/90 mmHg  Pulse 82  Ht 5' 10" (1.778 m)  Wt 168 lb 3.2 oz (76.295 kg)  BMI 24.13 kg/m2  General Appearance: Patient is pleasant but confused.  He is fairly groomed.  Musculoskeletal: Strength & Muscle Tone: decreased and atrophy Gait & Station: unsteady Patient leans: Front  Mental status examination Patient is casually dressed and fairly groomed. He maintained fair eye contact.  He is pleasant and cooperative.  He described his mood good and his affect is improved.  He has difficulty remembering things and sometime he asks questions multiple times.  His speech is soft and non-spontaneous.   His thought process is slow but logical and goal directed.  He denies any active or passive suicidal thoughts or homicidal thoughts.  He denies any auditory or visual hallucination.  His attention and concentration is distracted.  There were no delusions , paranoia or obsession present at this time.  He has fine tremors and has difficulty walking.  He need walker .  His gait is unsteady.  He's alert and oriented x3.  His insight judgment and impulse control is fair.  Established Problem, Stable/Improving (1), Review of Psycho-Social Stressors (1), Review or order  clinical lab tests (1), Review and summation of old records (2), Review of Last Therapy Session (1) and Review of Medication Regimen & Side Effects (2)  Assessment:  Axis I: Bipolar disorder, cognitive disorder NOS  Plan:  I reviewed records, current medication, recent blood work results and collateral information from other providers.  Patient is doing better on his current psychiatric medication.  He has no side effects including any rash itching or any EPS.  His mania and depression is under control.  I will continue Klonopin 0.5 mg half tablet in the morning and 1 tablet at bedtime.  Continue Seroquel 75 mg at bedtime, trazodone 100 mg at bedtime and Zoloft 200 mg daily.  Discussed medication side effects and benefits.  Discuss safety plan that anytime having active suicidal thoughts or homicidal thoughts then he need to call 911 or go to the local emergency room.  I will see him in 3 months.  Time spent 25 minutes.  More than 50% of the time spent in psychoeducation, counseling and coordination of care.   , T., MD 12/30/2015

## 2016-03-01 ENCOUNTER — Other Ambulatory Visit: Payer: Self-pay | Admitting: Internal Medicine

## 2016-03-01 ENCOUNTER — Ambulatory Visit
Admission: RE | Admit: 2016-03-01 | Discharge: 2016-03-01 | Disposition: A | Discharge status: Home / Self Care | Payer: Medicare Other | Source: Ambulatory Visit | Attending: Internal Medicine | Admitting: Internal Medicine

## 2016-03-01 DIAGNOSIS — R05 Cough: Secondary | ICD-10-CM

## 2016-03-01 DIAGNOSIS — R059 Cough, unspecified: Secondary | ICD-10-CM

## 2016-03-13 ENCOUNTER — Emergency Department (HOSPITAL_COMMUNITY): Payer: Medicare Other

## 2016-03-13 ENCOUNTER — Encounter (HOSPITAL_COMMUNITY): Payer: Self-pay | Admitting: Emergency Medicine

## 2016-03-13 ENCOUNTER — Emergency Department (HOSPITAL_COMMUNITY)
Admission: EM | Admit: 2016-03-13 | Discharge: 2016-03-13 | Disposition: A | Discharge status: Home / Self Care | Payer: Medicare Other | Attending: Emergency Medicine | Admitting: Emergency Medicine

## 2016-03-13 DIAGNOSIS — Z792 Long term (current) use of antibiotics: Secondary | ICD-10-CM | POA: Insufficient documentation

## 2016-03-13 DIAGNOSIS — Z79899 Other long term (current) drug therapy: Secondary | ICD-10-CM | POA: Diagnosis not present

## 2016-03-13 DIAGNOSIS — G20 Parkinson's disease: Secondary | ICD-10-CM | POA: Insufficient documentation

## 2016-03-13 DIAGNOSIS — I10 Essential (primary) hypertension: Secondary | ICD-10-CM | POA: Insufficient documentation

## 2016-03-13 DIAGNOSIS — E119 Type 2 diabetes mellitus without complications: Secondary | ICD-10-CM | POA: Insufficient documentation

## 2016-03-13 DIAGNOSIS — Z8781 Personal history of (healed) traumatic fracture: Secondary | ICD-10-CM | POA: Insufficient documentation

## 2016-03-13 DIAGNOSIS — Z8614 Personal history of Methicillin resistant Staphylococcus aureus infection: Secondary | ICD-10-CM | POA: Insufficient documentation

## 2016-03-13 DIAGNOSIS — Z8701 Personal history of pneumonia (recurrent): Secondary | ICD-10-CM | POA: Diagnosis not present

## 2016-03-13 DIAGNOSIS — Z7982 Long term (current) use of aspirin: Secondary | ICD-10-CM | POA: Insufficient documentation

## 2016-03-13 DIAGNOSIS — N390 Urinary tract infection, site not specified: Secondary | ICD-10-CM | POA: Insufficient documentation

## 2016-03-13 DIAGNOSIS — F319 Bipolar disorder, unspecified: Secondary | ICD-10-CM | POA: Insufficient documentation

## 2016-03-13 DIAGNOSIS — Z7952 Long term (current) use of systemic steroids: Secondary | ICD-10-CM | POA: Insufficient documentation

## 2016-03-13 DIAGNOSIS — R531 Weakness: Secondary | ICD-10-CM | POA: Diagnosis present

## 2016-03-13 LAB — CBC WITH DIFFERENTIAL/PLATELET
BASOS ABS: 0 10*3/uL (ref 0.0–0.1)
BASOS PCT: 0 %
EOS ABS: 0.2 10*3/uL (ref 0.0–0.7)
Eosinophils Relative: 2 %
HCT: 38.4 % — ABNORMAL LOW (ref 39.0–52.0)
Hemoglobin: 12.3 g/dL — ABNORMAL LOW (ref 13.0–17.0)
Lymphocytes Relative: 7 %
Lymphs Abs: 0.9 10*3/uL (ref 0.7–4.0)
MCH: 29.3 pg (ref 26.0–34.0)
MCHC: 32 g/dL (ref 30.0–36.0)
MCV: 91.4 fL (ref 78.0–100.0)
MONO ABS: 0.7 10*3/uL (ref 0.1–1.0)
Monocytes Relative: 5 %
NEUTROS ABS: 11.4 10*3/uL — AB (ref 1.7–7.7)
Neutrophils Relative %: 86 %
PLATELETS: 240 10*3/uL (ref 150–400)
RBC: 4.2 MIL/uL — ABNORMAL LOW (ref 4.22–5.81)
RDW: 14.5 % (ref 11.5–15.5)
WBC: 13.1 10*3/uL — ABNORMAL HIGH (ref 4.0–10.5)

## 2016-03-13 LAB — COMPREHENSIVE METABOLIC PANEL
ALBUMIN: 3.6 g/dL (ref 3.5–5.0)
ALK PHOS: 82 U/L (ref 38–126)
ALT: 6 U/L — ABNORMAL LOW (ref 17–63)
ANION GAP: 10 (ref 5–15)
AST: 21 U/L (ref 15–41)
BILIRUBIN TOTAL: 0.8 mg/dL (ref 0.3–1.2)
BUN: 25 mg/dL — ABNORMAL HIGH (ref 6–20)
CALCIUM: 9 mg/dL (ref 8.9–10.3)
CO2: 22 mmol/L (ref 22–32)
Chloride: 107 mmol/L (ref 101–111)
Creatinine, Ser: 1.22 mg/dL (ref 0.61–1.24)
GFR calc non Af Amer: 55 mL/min — ABNORMAL LOW (ref >60)
GLUCOSE: 127 mg/dL — AB (ref 65–99)
Potassium: 4.3 mmol/L (ref 3.5–5.1)
Sodium: 139 mmol/L (ref 135–145)
TOTAL PROTEIN: 6 g/dL — AB (ref 6.5–8.1)

## 2016-03-13 LAB — URINALYSIS, ROUTINE W REFLEX MICROSCOPIC
Bilirubin Urine: NEGATIVE
Glucose, UA: NEGATIVE mg/dL
KETONES UR: 15 mg/dL — AB
NITRITE: POSITIVE — AB
PH: 6 (ref 5.0–8.0)
PROTEIN: NEGATIVE mg/dL
Specific Gravity, Urine: 1.015 (ref 1.005–1.030)

## 2016-03-13 LAB — URINE MICROSCOPIC-ADD ON: Squamous Epithelial / LPF: NONE SEEN

## 2016-03-13 LAB — PROTIME-INR
INR: 1.08 (ref 0.00–1.49)
PROTHROMBIN TIME: 14.2 s (ref 11.6–15.2)

## 2016-03-13 LAB — I-STAT CG4 LACTIC ACID, ED
LACTIC ACID, VENOUS: 0.7 mmol/L (ref 0.5–2.0)
Lactic Acid, Venous: 0.37 mmol/L — ABNORMAL LOW (ref 0.5–2.0)

## 2016-03-13 MED ORDER — LORAZEPAM 2 MG/ML IJ SOLN
0.5000 mg | Freq: Once | INTRAMUSCULAR | Status: AC
  Administered 2016-03-13: 0.5 mg via INTRAVENOUS
  Filled 2016-03-13: qty 1

## 2016-03-13 MED ORDER — SODIUM CHLORIDE 0.9 % IV BOLUS (SEPSIS)
1000.0000 mL | Freq: Once | INTRAVENOUS | Status: AC
  Administered 2016-03-13: 1000 mL via INTRAVENOUS

## 2016-03-13 MED ORDER — CEPHALEXIN 500 MG PO CAPS: 500.0000 mg | ORAL_CAPSULE | Freq: Four times a day (QID) | ORAL | Status: DC

## 2016-03-13 MED ORDER — LEVOFLOXACIN IN D5W 750 MG/150ML IV SOLN
750.0000 mg | Freq: Once | INTRAVENOUS | Status: AC
  Administered 2016-03-13: 750 mg via INTRAVENOUS
  Filled 2016-03-13: qty 150

## 2016-03-13 NOTE — ED Notes (Signed)
Patient transported to X-ray 

## 2016-03-13 NOTE — ED Notes (Signed)
Pt here with family from [facility] c/o increased falls, generalized weakness and issues with foley

## 2016-03-13 NOTE — ED Provider Notes (Signed)
CSN: [id number]     Arrival date & time 03/13/16  1202 History   First MD Initiated Contact with Patient 03/13/16 1250     Chief Complaint  Patient presents with  . Fall  . Weakness   (Consider location/radiation/quality/duration/timing/severity/associated sxs/prior Treatment) Patient is a 81 y.o. male presenting with fall and weakness. The history is provided by the patient. No language interpreter was used.  Fall Associated symptoms include weakness. Pertinent negatives include no fever or vomiting.  Weakness Associated symptoms include weakness. Pertinent negatives include no fever or vomiting.  Mr. Bordonaro is a 81 y.o male With a history of hypertension, Parkinson's disease, diabetes, bipolar disorder, frequent falls, and UTI who presents [facility] for increased falls, generalized weakness and issues with his Foley cath. Patient is on Plavix and Imdur according to cardiology note from last year. Power of attorney who is at bedside agrees that patient is on Plavix. She states that the assisted nursing facility called her stating that the patient had 5 falls during the past 5 days. They recommended that he come to the hospital. They also stated that he normally is able to recognize people and converse. He is able to use the toilet himself. He has had a Foley cath in for the last 20 years. Power of attorney states that he normally gets like this when he has a urine infection or pneumonia. She states that he has not himself either. Patient was diagnosed with influenza about 10 days ago and POA says he hasn't recovered since.  Caregiver at bedside state denies any loss of consciousness or head injury.     Past Medical History  Diagnosis Date  . Hearing aid worn   . Hypertension   . Parkinson disease ([place])   . Pneumonia   . Diabetes mellitus type II   . Arthritis   . Bipolar disorder ([place])   . Anxiety   . Depression   . MRSA infection (methicillin-resistant Staphylococcus  aureus)   . Chronic indwelling Foley catheter   . C2 cervical fracture ([name])     due to pt fall  . SIRS (systemic inflammatory response syndrome) ([name])   . Memory loss   . Benign prostate hyperplasia   . Memory difficulty 08/21/2014  . Neuromuscular disorder ([place])     parkinsons  . Flu   . Falls   . UTI (urinary tract infection)    Past Surgical History  Procedure Laterality Date  . Total hip arthroplasty    . Video bronchoscopy  12/16/2012    Procedure: VIDEO BRONCHOSCOPY;  Surgeon: Melrose Nakayama, MD;  Location: [facility];  Service: Thoracic;  Laterality: Right;  . Video assisted thoracoscopy (vats)/empyema  12/16/2012    Procedure: VIDEO ASSISTED THORACOSCOPY (VATS)/EMPYEMA;  Surgeon: Melrose Nakayama, MD;  Location: [facility];  Service: Thoracic;  Laterality: Right;   Family History  Problem Relation Age of Onset  . Depression Father   . Cancer Mother   . Heart disease Mother    Social History  Substance Use Topics  . Smoking status: Never Smoker   . Smokeless tobacco: Never Used  . Alcohol Use: No     Comment: occasional    Review of Systems  Constitutional: Negative for fever.  Gastrointestinal: Negative for vomiting and diarrhea.  Neurological: Positive for weakness.  All other systems reviewed and are negative.     Allergies  Cholestatin  Home Medications   Prior to Admission medications   Medication Sig Start Date End Date  Taking? Authorizing Provider  acetaminophen (TYLENOL) 325 MG tablet Take 650 mg by mouth every 6 (six) hours as needed for mild pain.   Yes Historical Provider, MD  albuterol (PROVENTIL) (2.5 MG/3ML) 0.083% nebulizer solution Take 3 mLs (2.5 mg total) by nebulization every 2 (two) hours as needed for wheezing or shortness of breath. 04/21/14  Yes Hosie Poisson, MD  amLODipine (NORVASC) 5 MG tablet Take 5 mg by mouth daily.   Yes Historical Provider, MD  aspirin 81 MG chewable tablet Chew 81 mg by mouth daily.   Yes Historical Provider,  MD  carbidopa-levodopa (SINEMET IR) 25-250 MG per tablet Take 1 tablet by mouth 3 (three) times daily.   Yes Historical Provider, MD  clonazePAM (KLONOPIN) 0.5 MG tablet Take 0.25mg  at noon and 0.5mg  at bedtime Patient taking differently: Take 0.5 mg by mouth at bedtime. Take 0.25mg  at noon and 0.5mg  at bedtime 12/30/15  Yes Kathlee Nations, MD  entacapone (COMTAN) 200 MG tablet Take 200 mg by mouth 3 (three) times daily.   Yes Historical Provider, MD  ferrous sulfate 325 (65 FE) MG tablet Take 325 mg by mouth 2 (two) times daily.   Yes Historical Provider, MD  GLUCERNA (GLUCERNA) LIQD Take 237 mLs by mouth daily.    Yes Historical Provider, MD  hydrALAZINE (APRESOLINE) 25 MG tablet Take 25 mg by mouth 2 (two) times daily.    Yes Historical Provider, MD  isosorbide mononitrate (IMDUR) 30 MG 24 hr tablet Take 1 tablet (30 mg total) by mouth daily. 10/07/15  Yes Florencia Reasons, MD  lamoTRIgine (LAMICTAL) 150 MG tablet Take 1 tablet (150 mg total) by mouth 2 (two) times daily. 12/30/15  Yes Kathlee Nations, MD  lisinopril (PRINIVIL,ZESTRIL) 10 MG tablet Take 0.5 tablets (5 mg total) by mouth daily. 10/07/15  Yes Florencia Reasons, MD  Multiple Vitamin (MULTIVITAMIN WITH MINERALS) TABS tablet Take 1 tablet by mouth daily.   Yes Historical Provider, MD  oseltamivir (TAMIFLU) 75 MG capsule Take 75 mg by mouth daily.   Yes Historical Provider, MD  pantoprazole (PROTONIX) 40 MG tablet Take 40 mg by mouth daily.   Yes Historical Provider, MD  predniSONE (DELTASONE) 2.5 MG tablet Take 2.5 mg by mouth daily with breakfast.   Yes Historical Provider, MD  QUEtiapine (SEROQUEL) 25 MG tablet Take 3 tablets (75 mg total) by mouth at bedtime. 12/30/15  Yes Kathlee Nations, MD  rosuvastatin (CRESTOR) 20 MG tablet Take 20 mg by mouth daily.   Yes Historical Provider, MD  sertraline (ZOLOFT) 100 MG tablet Take 2 tablets (200 mg total) by mouth daily. 10/01/14  Yes Kathlee Nations, MD  traZODone (DESYREL) 100 MG tablet Take 1 tablet (100 mg  total) by mouth at bedtime. 12/30/15  Yes Kathlee Nations, MD  amoxicillin-clavulanate (AUGMENTIN) 875-125 MG tablet Take 1 tablet by mouth 2 (two) times daily. Patient not taking: Reported on 03/13/2016 10/07/15   Florencia Reasons, MD  cephALEXin (KEFLEX) 500 MG capsule Take 1 capsule (500 mg total) by mouth 4 (four) times daily. 03/13/16   Lisa Blakeman Patel-Mills, PA-C  clopidogrel (PLAVIX) 75 MG tablet Take 1 tablet (75 mg total) by mouth daily. Patient not taking: Reported on 03/13/2016 10/07/15   Florencia Reasons, MD  Maltodextrin-Xanthan Gum (RESOURCE THICKENUP CLEAR) POWD Take 120 g by mouth as needed. Patient not taking: Reported on 03/13/2016 10/07/15   Florencia Reasons, MD  Skin Protectants, Misc. (DIMETHICONE-ZINC OXIDE) cream Apply topically 2 (two) times daily as needed for dry skin.  Reported on 03/13/2016    Historical Provider, MD   BP 134/77 mmHg  Pulse 67  Temp(Src) 99.1 F (37.3 C) (Oral)  Resp 16  Wt 74.39 kg  SpO2 100% Physical Exam  Constitutional: He is oriented to person, place, and time. He appears well-developed and well-nourished. No distress.  HENT:  Head: Normocephalic and atraumatic.  Eyes: Conjunctivae are normal.  Neck: Normal range of motion. Neck supple.  Cardiovascular: Normal rate, regular rhythm and normal heart sounds.   Pulmonary/Chest: Effort normal and breath sounds normal. No respiratory distress.  Multiple old bruises along the right chest wall. No respiratory distress. Lungs clear to auscultation bilaterally.  Abdominal: Soft. Normal appearance. There is no tenderness. There is no rebound and no guarding.  Abdomen is soft and nontender.  Musculoskeletal: Normal range of motion.  Neurological: He is alert and oriented to person, place, and time.  Unable to answer questions appropriately. He is verbal and asking for food. No motor deficit.  Skin: Skin is warm and dry. He is not diaphoretic.  Nursing note and vitals reviewed.   ED Course  Procedures (including critical care  time) Labs Review Labs Reviewed  COMPREHENSIVE METABOLIC PANEL - Abnormal; Notable for the following:    Glucose, Bld 127 (*)    BUN 25 (*)    Total Protein 6.0 (*)    ALT 6 (*)    GFR calc non Af Amer 55 (*)    All other components within normal limits  CBC WITH DIFFERENTIAL/PLATELET - Abnormal; Notable for the following:    WBC 13.1 (*)    RBC 4.20 (*)    Hemoglobin 12.3 (*)    HCT 38.4 (*)    Neutro Abs 11.4 (*)    All other components within normal limits  URINALYSIS, ROUTINE W REFLEX MICROSCOPIC (NOT AT [facility]) - Abnormal; Notable for the following:    APPearance TURBID (*)    Hgb urine dipstick LARGE (*)    Ketones, ur 15 (*)    Nitrite POSITIVE (*)    Leukocytes, UA LARGE (*)    All other components within normal limits  URINE MICROSCOPIC-ADD ON - Abnormal; Notable for the following:    Bacteria, UA MANY (*)    All other components within normal limits  I-STAT CG4 LACTIC ACID, ED - Abnormal; Notable for the following:    Lactic Acid, Venous 0.37 (*)    All other components within normal limits  CULTURE, BLOOD (ROUTINE X 2)  CULTURE, BLOOD (ROUTINE X 2)  URINE CULTURE  PROTIME-INR  I-STAT CG4 LACTIC ACID, ED    Imaging Review Dg Chest 2 View  03/13/2016  CLINICAL DATA:  Increased falls the past few days with generalized weakness. EXAM: CHEST  2 VIEW COMPARISON:  Chest x-rays dated 03/01/2016 and 09/21/2014. FINDINGS: The small opacity within the right mid lung region is stable compared to the earlier exam of 09/21/2014, probably recurrent atelectasis and/or small amount of fluid within the minor fissure. No new lung findings. Cardiomegaly is stable. No evidence of active CHF at this time. Multiple old healed rib fractures again noted on the left. Extensive degenerative change again seen at the bilateral shoulders. No acute-appearing osseous abnormality. IMPRESSION: Stable chest x-ray.  No acute findings Electronically Signed   By: Franki Cabot M.D.   On: 03/13/2016 13:40    I have personally reviewed and evaluated these images and lab results as part of my medical decision-making.   EKG Interpretation   Date/Time:  Monday March 13 2016 13:01:08 EDT Ventricular Rate:  57 PR Interval:  187 QRS Duration: 128 QT Interval:  445 QTC Calculation: 433 R Axis:   -13 Text Interpretation:  Sinus rhythm Consider left atrial enlargement  Nonspecific intraventricular conduction delay No significant change since  last tracing Confirmed by KNOTT MD, Quillian Quince [id number]) on 03/13/2016 1:05:25 PM      MDM   Final diagnoses:  Urinary tract infection, acute   Patient presents for frequent falls from nursing home. He has power of attorney at bedside who states that he has not been himself since she picked him up today. She reports that he is talking nonsensical at times. She also reports that he gets this way when he has a urinary infection or pneumonia. His vital signs are stable and he is afebrile. He was mildly hypotensive on arrival was given a bag of fluids. He had old bruising to the right side of his chest but no pneumonia. There were old fractures on x-ray. His lactic acid was normal. He did have positive nitrites and leukocytes on urinalysis. He has had a Foley cath for 20 years. Urine culture is pending. Otherwise patient does not have significant white count and CMP is not concerning. He was given Keflex to go home with. He is able to tell me where he is and who is at the bedside. He does have a history of memory loss and Parkinson's. This could be attributing to his falls. He will need to follow up with his primary care or bite her. He is to stay well-hydrated.  He is tolerating fluids and ate while at the ED. I discussed return precautions as well as follow-up with the power of attorney as well as the patient. They agree with plan.     Ottie Glazier, PA-C 03/14/16 [id number]  Leo Grosser, MD 03/14/16 2322

## 2016-03-13 NOTE — Discharge Instructions (Signed)
Urinary Tract Infection Follow up with your primary care physician in 2 days. Take anabiotic says prescribed. Drink plenty of fluids and stay well-hydrated. Return for fever or altered mental status. A urinary tract infection (UTI) can occur any place along the urinary tract. The tract includes the kidneys, ureters, bladder, and urethra. A type of germ called bacteria often causes a UTI. UTIs are often helped with antibiotic medicine.  HOME CARE   If given, take antibiotics as told by your doctor. Finish them even if you start to feel better.  Drink enough fluids to keep your pee (urine) clear or pale yellow.  Avoid tea, drinks with caffeine, and bubbly (carbonated) drinks.  Pee often. Avoid holding your pee in for a long time.  Pee before and after having sex (intercourse).  Wipe from front to back after you poop (bowel movement) if you are a woman. Use each tissue only once. GET HELP RIGHT AWAY IF:   You have back pain.  You have lower belly (abdominal) pain.  You have chills.  You feel sick to your stomach (nauseous).  You throw up (vomit).  Your burning or discomfort with peeing does not go away.  You have a fever.  Your symptoms are not better in 3 days. MAKE SURE YOU:   Understand these instructions.  Will watch your condition.  Will get help right away if you are not doing well or get worse.   This information is not intended to replace advice given to you by your health care provider. Make sure you discuss any questions you have with your health care provider.   Document Released: 05/22/2008 Document Revised: 12/25/2014 Document Reviewed: 07/04/2012 Elsevier Interactive Patient Education [employer].

## 2016-03-14 ENCOUNTER — Telehealth (HOSPITAL_BASED_OUTPATIENT_CLINIC_OR_DEPARTMENT_OTHER): Payer: Self-pay | Admitting: Emergency Medicine

## 2016-03-14 ENCOUNTER — Encounter (HOSPITAL_COMMUNITY): Payer: Self-pay | Admitting: Emergency Medicine

## 2016-03-14 ENCOUNTER — Inpatient Hospital Stay (HOSPITAL_COMMUNITY)
Admission: EM | Admit: 2016-03-14 | Discharge: 2016-03-16 | DRG: 690 | Disposition: A | Discharge status: Skilled Nursing Facility | Payer: Medicare Other | Attending: Internal Medicine | Admitting: Internal Medicine

## 2016-03-14 DIAGNOSIS — Z7982 Long term (current) use of aspirin: Secondary | ICD-10-CM

## 2016-03-14 DIAGNOSIS — Z1619 Resistance to other specified beta lactam antibiotics: Secondary | ICD-10-CM | POA: Diagnosis present

## 2016-03-14 DIAGNOSIS — I110 Hypertensive heart disease with heart failure: Secondary | ICD-10-CM | POA: Diagnosis not present

## 2016-03-14 DIAGNOSIS — M19012 Primary osteoarthritis, left shoulder: Secondary | ICD-10-CM | POA: Diagnosis not present

## 2016-03-14 DIAGNOSIS — E1100 Type 2 diabetes mellitus with hyperosmolarity without nonketotic hyperglycemic-hyperosmolar coma (NKHHC): Secondary | ICD-10-CM

## 2016-03-14 DIAGNOSIS — Z9289 Personal history of other medical treatment: Secondary | ICD-10-CM | POA: Diagnosis not present

## 2016-03-14 DIAGNOSIS — N390 Urinary tract infection, site not specified: Secondary | ICD-10-CM | POA: Diagnosis not present

## 2016-03-14 DIAGNOSIS — I5032 Chronic diastolic (congestive) heart failure: Secondary | ICD-10-CM

## 2016-03-14 DIAGNOSIS — F419 Anxiety disorder, unspecified: Secondary | ICD-10-CM | POA: Diagnosis present

## 2016-03-14 DIAGNOSIS — F319 Bipolar disorder, unspecified: Secondary | ICD-10-CM | POA: Diagnosis not present

## 2016-03-14 DIAGNOSIS — G20 Parkinson's disease: Secondary | ICD-10-CM | POA: Diagnosis present

## 2016-03-14 DIAGNOSIS — F0390 Unspecified dementia without behavioral disturbance: Secondary | ICD-10-CM | POA: Diagnosis present

## 2016-03-14 DIAGNOSIS — K219 Gastro-esophageal reflux disease without esophagitis: Secondary | ICD-10-CM | POA: Diagnosis present

## 2016-03-14 DIAGNOSIS — Z978 Presence of other specified devices: Secondary | ICD-10-CM

## 2016-03-14 DIAGNOSIS — E440 Moderate protein-calorie malnutrition: Secondary | ICD-10-CM | POA: Diagnosis not present

## 2016-03-14 DIAGNOSIS — F3112 Bipolar disorder, current episode manic without psychotic features, moderate: Secondary | ICD-10-CM

## 2016-03-14 DIAGNOSIS — Z7902 Long term (current) use of antithrombotics/antiplatelets: Secondary | ICD-10-CM

## 2016-03-14 DIAGNOSIS — M19011 Primary osteoarthritis, right shoulder: Secondary | ICD-10-CM | POA: Diagnosis present

## 2016-03-14 DIAGNOSIS — D649 Anemia, unspecified: Secondary | ICD-10-CM | POA: Diagnosis not present

## 2016-03-14 DIAGNOSIS — E119 Type 2 diabetes mellitus without complications: Secondary | ICD-10-CM | POA: Diagnosis not present

## 2016-03-14 DIAGNOSIS — L89152 Pressure ulcer of sacral region, stage 2: Secondary | ICD-10-CM

## 2016-03-14 DIAGNOSIS — Z960 Presence of urogenital implants: Secondary | ICD-10-CM

## 2016-03-14 DIAGNOSIS — N400 Enlarged prostate without lower urinary tract symptoms: Secondary | ICD-10-CM | POA: Diagnosis not present

## 2016-03-14 DIAGNOSIS — Z8614 Personal history of Methicillin resistant Staphylococcus aureus infection: Secondary | ICD-10-CM

## 2016-03-14 DIAGNOSIS — Z6822 Body mass index (BMI) 22.0-22.9, adult: Secondary | ICD-10-CM

## 2016-03-14 DIAGNOSIS — Z888 Allergy status to other drugs, medicaments and biological substances status: Secondary | ICD-10-CM

## 2016-03-14 DIAGNOSIS — R7881 Bacteremia: Secondary | ICD-10-CM

## 2016-03-14 DIAGNOSIS — L89159 Pressure ulcer of sacral region, unspecified stage: Secondary | ICD-10-CM | POA: Diagnosis present

## 2016-03-14 DIAGNOSIS — Z66 Do not resuscitate: Secondary | ICD-10-CM | POA: Diagnosis not present

## 2016-03-14 DIAGNOSIS — L89151 Pressure ulcer of sacral region, stage 1: Secondary | ICD-10-CM | POA: Diagnosis not present

## 2016-03-14 DIAGNOSIS — Z87440 Personal history of urinary (tract) infections: Secondary | ICD-10-CM

## 2016-03-14 DIAGNOSIS — Z974 Presence of external hearing-aid: Secondary | ICD-10-CM

## 2016-03-14 DIAGNOSIS — Z9181 History of falling: Secondary | ICD-10-CM

## 2016-03-14 LAB — CBC WITH DIFFERENTIAL/PLATELET
BASOS ABS: 0 10*3/uL (ref 0.0–0.1)
BASOS PCT: 1 %
Eosinophils Absolute: 0.2 10*3/uL (ref 0.0–0.7)
Eosinophils Relative: 2 %
HEMATOCRIT: 37.3 % — AB (ref 39.0–52.0)
HEMOGLOBIN: 11.6 g/dL — AB (ref 13.0–17.0)
LYMPHS PCT: 12 %
Lymphs Abs: 1 10*3/uL (ref 0.7–4.0)
MCH: 28.5 pg (ref 26.0–34.0)
MCHC: 31.1 g/dL (ref 30.0–36.0)
MCV: 91.6 fL (ref 78.0–100.0)
MONOS PCT: 5 %
Monocytes Absolute: 0.4 10*3/uL (ref 0.1–1.0)
NEUTROS ABS: 6.5 10*3/uL (ref 1.7–7.7)
Neutrophils Relative %: 80 %
Platelets: 257 10*3/uL (ref 150–400)
RBC: 4.07 MIL/uL — AB (ref 4.22–5.81)
RDW: 14.4 % (ref 11.5–15.5)
WBC: 8.1 10*3/uL (ref 4.0–10.5)

## 2016-03-14 LAB — URINALYSIS, ROUTINE W REFLEX MICROSCOPIC
Bilirubin Urine: NEGATIVE
Glucose, UA: NEGATIVE mg/dL
Hgb urine dipstick: NEGATIVE
Ketones, ur: NEGATIVE mg/dL
LEUKOCYTES UA: NEGATIVE
NITRITE: NEGATIVE
PH: 5.5 (ref 5.0–8.0)
Protein, ur: NEGATIVE mg/dL
SPECIFIC GRAVITY, URINE: 1.017 (ref 1.005–1.030)

## 2016-03-14 LAB — BASIC METABOLIC PANEL
ANION GAP: 7 (ref 5–15)
BUN: 23 mg/dL — ABNORMAL HIGH (ref 6–20)
CALCIUM: 8.7 mg/dL — AB (ref 8.9–10.3)
CO2: 23 mmol/L (ref 22–32)
Chloride: 111 mmol/L (ref 101–111)
Creatinine, Ser: 1.13 mg/dL (ref 0.61–1.24)
GFR calc Af Amer: >60 mL/min (ref >60)
GFR calc non Af Amer: 60 mL/min — ABNORMAL LOW (ref >60)
GLUCOSE: 139 mg/dL — AB (ref 65–99)
Potassium: 4.3 mmol/L (ref 3.5–5.1)
Sodium: 141 mmol/L (ref 135–145)

## 2016-03-14 LAB — I-STAT CG4 LACTIC ACID, ED: LACTIC ACID, VENOUS: 0.69 mmol/L (ref 0.5–2.0)

## 2016-03-14 LAB — GLUCOSE, CAPILLARY: Glucose-Capillary: 69 mg/dL (ref 65–99)

## 2016-03-14 MED ORDER — CEFAZOLIN SODIUM-DEXTROSE 2-4 GM/100ML-% IV SOLN
2.0000 g | Freq: Three times a day (TID) | INTRAVENOUS | Status: DC
  Administered 2016-03-14 – 2016-03-15 (×2): 2 g via INTRAVENOUS
  Filled 2016-03-14 (×5): qty 100

## 2016-03-14 MED ORDER — VANCOMYCIN HCL IN DEXTROSE 1-5 GM/200ML-% IV SOLN
1000.0000 mg | Freq: Once | INTRAVENOUS | Status: AC
  Administered 2016-03-14: 1000 mg via INTRAVENOUS
  Filled 2016-03-14: qty 200

## 2016-03-14 MED ORDER — CLOPIDOGREL BISULFATE 75 MG PO TABS
75.0000 mg | ORAL_TABLET | Freq: Every day | ORAL | Status: DC
  Administered 2016-03-15 – 2016-03-16 (×2): 75 mg via ORAL
  Filled 2016-03-14 (×2): qty 1

## 2016-03-14 MED ORDER — ASPIRIN 81 MG PO CHEW
81.0000 mg | CHEWABLE_TABLET | Freq: Every day | ORAL | Status: DC
  Administered 2016-03-15 – 2016-03-16 (×2): 81 mg via ORAL
  Filled 2016-03-14 (×2): qty 1

## 2016-03-14 MED ORDER — ACETAMINOPHEN 325 MG PO TABS: 650.0000 mg | ORAL_TABLET | Freq: Four times a day (QID) | ORAL | Status: DC | PRN

## 2016-03-14 MED ORDER — CARBIDOPA-LEVODOPA 25-250 MG PO TABS
1.0000 | ORAL_TABLET | Freq: Three times a day (TID) | ORAL | Status: DC
  Administered 2016-03-14 – 2016-03-16 (×5): 1 via ORAL
  Filled 2016-03-14 (×8): qty 1

## 2016-03-14 MED ORDER — INSULIN ASPART 100 UNIT/ML ~~LOC~~ SOLN: 0.0000 [IU] | Freq: Every day | SUBCUTANEOUS | Status: DC

## 2016-03-14 MED ORDER — LEVOFLOXACIN IN D5W 750 MG/150ML IV SOLN
750.0000 mg | Freq: Once | INTRAVENOUS | Status: AC
  Administered 2016-03-14: 750 mg via INTRAVENOUS
  Filled 2016-03-14: qty 150

## 2016-03-14 MED ORDER — QUETIAPINE FUMARATE 50 MG PO TABS
75.0000 mg | ORAL_TABLET | Freq: Every day | ORAL | Status: DC
  Administered 2016-03-14 – 2016-03-15 (×2): 75 mg via ORAL
  Filled 2016-03-14 (×2): qty 2

## 2016-03-14 MED ORDER — HYDRALAZINE HCL 25 MG PO TABS
25.0000 mg | ORAL_TABLET | Freq: Two times a day (BID) | ORAL | Status: DC
  Administered 2016-03-15 – 2016-03-16 (×3): 25 mg via ORAL
  Filled 2016-03-14 (×3): qty 1

## 2016-03-14 MED ORDER — SODIUM CHLORIDE 0.9 % IV SOLN
INTRAVENOUS | Status: AC
  Administered 2016-03-14: 21:00:00 via INTRAVENOUS

## 2016-03-14 MED ORDER — GLUCERNA PO LIQD: 237.0000 mL | Freq: Every day | ORAL | Status: DC

## 2016-03-14 MED ORDER — SERTRALINE HCL 100 MG PO TABS
200.0000 mg | ORAL_TABLET | Freq: Every day | ORAL | Status: DC
  Administered 2016-03-15 – 2016-03-16 (×2): 200 mg via ORAL
  Filled 2016-03-14 (×2): qty 2

## 2016-03-14 MED ORDER — ONDANSETRON HCL 4 MG PO TABS: 4.0000 mg | ORAL_TABLET | Freq: Four times a day (QID) | ORAL | Status: DC | PRN

## 2016-03-14 MED ORDER — ACETAMINOPHEN 650 MG RE SUPP: 650.0000 mg | Freq: Four times a day (QID) | RECTAL | Status: DC | PRN

## 2016-03-14 MED ORDER — TRAZODONE HCL 100 MG PO TABS
100.0000 mg | ORAL_TABLET | Freq: Every day | ORAL | Status: DC
  Administered 2016-03-14 – 2016-03-15 (×2): 100 mg via ORAL
  Filled 2016-03-14 (×2): qty 1

## 2016-03-14 MED ORDER — ROSUVASTATIN CALCIUM 20 MG PO TABS
20.0000 mg | ORAL_TABLET | Freq: Every day | ORAL | Status: DC
  Administered 2016-03-15 – 2016-03-16 (×2): 20 mg via ORAL
  Filled 2016-03-14 (×4): qty 1

## 2016-03-14 MED ORDER — ENOXAPARIN SODIUM 40 MG/0.4ML ~~LOC~~ SOLN
40.0000 mg | SUBCUTANEOUS | Status: DC
  Administered 2016-03-14 – 2016-03-15 (×2): 40 mg via SUBCUTANEOUS
  Filled 2016-03-14 (×2): qty 0.4

## 2016-03-14 MED ORDER — AMLODIPINE BESYLATE 5 MG PO TABS
5.0000 mg | ORAL_TABLET | Freq: Every day | ORAL | Status: DC
  Administered 2016-03-15 – 2016-03-16 (×2): 5 mg via ORAL
  Filled 2016-03-14 (×2): qty 1

## 2016-03-14 MED ORDER — ENTACAPONE 200 MG PO TABS
200.0000 mg | ORAL_TABLET | Freq: Three times a day (TID) | ORAL | Status: DC
  Administered 2016-03-14 – 2016-03-16 (×5): 200 mg via ORAL
  Filled 2016-03-14 (×8): qty 1

## 2016-03-14 MED ORDER — ISOSORBIDE MONONITRATE ER 30 MG PO TB24
30.0000 mg | ORAL_TABLET | Freq: Every day | ORAL | Status: DC
  Administered 2016-03-15 – 2016-03-16 (×2): 30 mg via ORAL
  Filled 2016-03-14 (×2): qty 1

## 2016-03-14 MED ORDER — SENNOSIDES-DOCUSATE SODIUM 8.6-50 MG PO TABS: 1.0000 | ORAL_TABLET | Freq: Every evening | ORAL | Status: DC | PRN

## 2016-03-14 MED ORDER — LAMOTRIGINE 150 MG PO TABS
150.0000 mg | ORAL_TABLET | Freq: Two times a day (BID) | ORAL | Status: DC
  Administered 2016-03-14 – 2016-03-16 (×4): 150 mg via ORAL
  Filled 2016-03-14 (×6): qty 1

## 2016-03-14 MED ORDER — ONDANSETRON HCL 4 MG/2ML IJ SOLN: 4.0000 mg | Freq: Four times a day (QID) | INTRAMUSCULAR | Status: DC | PRN

## 2016-03-14 MED ORDER — PREDNISONE 5 MG PO TABS
2.5000 mg | ORAL_TABLET | Freq: Every day | ORAL | Status: DC
  Administered 2016-03-15 – 2016-03-16 (×2): 2.5 mg via ORAL
  Filled 2016-03-14 (×2): qty 1

## 2016-03-14 MED ORDER — FERROUS SULFATE 325 (65 FE) MG PO TABS
325.0000 mg | ORAL_TABLET | Freq: Two times a day (BID) | ORAL | Status: DC
  Administered 2016-03-14 – 2016-03-16 (×4): 325 mg via ORAL
  Filled 2016-03-14 (×4): qty 1

## 2016-03-14 MED ORDER — PANTOPRAZOLE SODIUM 40 MG PO TBEC
40.0000 mg | DELAYED_RELEASE_TABLET | Freq: Every day | ORAL | Status: DC
  Administered 2016-03-15 – 2016-03-16 (×2): 40 mg via ORAL
  Filled 2016-03-14 (×2): qty 1

## 2016-03-14 MED ORDER — VANCOMYCIN HCL IN DEXTROSE 750-5 MG/150ML-% IV SOLN
750.0000 mg | Freq: Two times a day (BID) | INTRAVENOUS | Status: DC
  Administered 2016-03-15: 750 mg via INTRAVENOUS
  Filled 2016-03-14 (×2): qty 150

## 2016-03-14 MED ORDER — INSULIN ASPART 100 UNIT/ML ~~LOC~~ SOLN
0.0000 [IU] | Freq: Three times a day (TID) | SUBCUTANEOUS | Status: DC
  Administered 2016-03-15: 1 [IU] via SUBCUTANEOUS

## 2016-03-14 MED ORDER — CLONAZEPAM 0.5 MG PO TABS
0.5000 mg | ORAL_TABLET | Freq: Every day | ORAL | Status: DC
  Administered 2016-03-14 – 2016-03-15 (×2): 0.5 mg via ORAL
  Filled 2016-03-14 (×2): qty 1

## 2016-03-14 MED ORDER — GLUCERNA SHAKE PO LIQD
237.0000 mL | Freq: Every day | ORAL | Status: DC
  Administered 2016-03-16: 237 mL via ORAL

## 2016-03-14 NOTE — ED Notes (Signed)
Pt family member states they called to have him admitted to the hospital.

## 2016-03-14 NOTE — ED Notes (Signed)
Pt family member states pt came in yesterday for lethargy, weakness, confusion. Pt came out to having a UTI. Pt given levaquin IV and let us leave with a prescription for keflex. Pt family states he was called and said his "blood culture came back positive for bacteria in his blood stream". Pt denies complaints, denies any pain, just here due to positive blood cultures. Family member states pt looks much better than yesterday, started antibiotics for UTI yesterday. Pt in NAD, AAOX4.

## 2016-03-14 NOTE — Progress Notes (Addendum)
Received Patient from ED accompanied by caregiver. Patient AOx3 (person, time & situation), VS stable, with multiple bruises from previous fall, denies pain, and 100% O2 Sat on RA.  Patient oriented to room, bed controls and call light. At 2116, CBG=69 since patient had not eaten all day per caregiver.  Gave OJ and caregiver brought sandwich from [employer].  Recheck CBG=152 at 2234.  Patient resting on bed comfortably watching TV.

## 2016-03-14 NOTE — Progress Notes (Addendum)
Pharmacy Antibiotic Note  [name] is a 81 y.o. male admitted on 03/14/2016 with bacteremia. Pharmacy consulted to dose Vancomycin and Ancef. Presents from [facility] for positive blood cultures.Pt was seen in the ED yesterday for evaluation of weakness, confusion, lethargy. He was diagnosed with a UTI  and given a dose of Levaquin IV in the ED and discharged home with PO Keflex. WBC 8.1, afebrile, CrCl ~68mL/min.  Pt also received levaquin x1 in the ED.   3/27 BCx>>2/2 GPC in clusters   Plan: Vancomycin 1g IV x1 in the ED  Vancomycin 750mg  IV Q12h  Ancef 2g IV Q8h  F/U c/s, renal fxn, LOT, VT @ss     Temp (24hrs), Avg:98.2 F (36.8 C), Min:98.2 F (36.8 C), Max:98.2 F (36.8 C)   Recent Labs Lab 03/13/16 1242 03/13/16 1245 03/13/16 1549 03/14/16 1823 03/14/16 1833  WBC  --  13.1*  --  8.1  --   CREATININE  --  1.22  --  1.13  --   LATICACIDVEN 0.70  --  0.37*  --  0.69    Estimated Creatinine Clearance: 54.7 mL/min (by C-G formula based on Cr of 1.13).    Allergies  Allergen Reactions  . Cholestatin Other (See Comments)    Per MAR    Antimicrobials this admission: 3/28 Vanc>> 3/28 Ancef>> 3/28 Levaquin 750mg  IV x1 in the ED  Thank you for allowing pharmacy to be a part of this patient's care.  Enrika Aguado C. Lennox Grumbles, PharmD Pharmacy Resident  Pager: [phone] 03/14/2016 8:13 PM

## 2016-03-14 NOTE — H&P (Signed)
History and Physical  Patient Name: [name]     [id number]    DOB: Jul 02, 1935    DOA: 03/14/2016 Referring physician: Delrae Rend, PA-C PCP: No primary care provider on file.      Chief Complaint: Positive blood culture  HPI: [name] is a 81 y.o. male with a past medical history significant for Parkinsons with indwelling foley and sacral decubs, dementia and Bipolar disorder, and HTN who presents with bacteremia.  The patient was in his usual state of health at [facility] until a few days ago, per his POA, when he "pulled on his foley" and it got dislodged, and the home health agency had to come out to replace it, at which time some clots and bright red blood returned from his foley.  Then, over the las day or so, he was noted to be more confused and weak than usual, and so his POA came and brought him to the ER last night.  There, he was noted to have leukocytosis, pyuria, and low grade temperature, was given Levaquin and discharged with cephalexin for UTI.  Today, a blood culture drawn the day before grew GPC in 1/2 bottles, and so he was called back. His POA notes that since then he is more alert and less confused, he has no current fever, no more hematuria.  There was a mild cough this week, but no dyspnea, sputum or rigors.  No sick contacts.  In the ED, he was afebrile and hemodynamically stable.  Culture from yesterday shows 1/2 blood cultures with GPCs.  Urine culture being "reincubated".  He has a history of:  Oct 2016: Proteus bacteremia 2016: Pseudomonas and Klebsiella urine culture 2015 urine: MSSA and enterococcus June 2014 urine: MSSA and proteus 2013 urine: MRS and Pseudomonas  Levaquin was readministered and [facility] were asked to evaluate for admission.     Review of Systems:  All other systems negative except as just noted or noted in the history of present illness.  Allergies  Allergen Reactions  . Cholestatin Other (See Comments)    Per MAR     Prior to Admission medications   Medication Sig Start Date End Date Taking? Authorizing Provider  acetaminophen (TYLENOL) 325 MG tablet Take 650 mg by mouth every 6 (six) hours as needed for mild pain.    Historical Provider, MD  albuterol (PROVENTIL) (2.5 MG/3ML) 0.083% nebulizer solution Take 3 mLs (2.5 mg total) by nebulization every 2 (two) hours as needed for wheezing or shortness of breath. 04/21/14   Hosie Poisson, MD  amLODipine (NORVASC) 5 MG tablet Take 5 mg by mouth daily.    Historical Provider, MD  aspirin 81 MG chewable tablet Chew 81 mg by mouth daily.    Historical Provider, MD  carbidopa-levodopa (SINEMET IR) 25-250 MG per tablet Take 1 tablet by mouth 3 (three) times daily.    Historical Provider, MD  clonazePAM (KLONOPIN) 0.5 MG tablet Take 0.25mg  at noon and 0.5mg  at bedtime Patient taking differently: Take 0.5 mg by mouth at bedtime. Take 0.25mg  at noon and 0.5mg  at bedtime 12/30/15   Kathlee Nations, MD  clopidogrel (PLAVIX) 75 MG tablet Take 1 tablet (75 mg total) by mouth daily. Patient not taking: Reported on 03/13/2016 10/07/15   Florencia Reasons, MD  entacapone (COMTAN) 200 MG tablet Take 200 mg by mouth 3 (three) times daily.    Historical Provider, MD  ferrous sulfate 325 (65 FE) MG tablet Take 325 mg by mouth 2 (two) times  daily.    Historical Provider, MD  GLUCERNA (GLUCERNA) LIQD Take 237 mLs by mouth daily.     Historical Provider, MD  hydrALAZINE (APRESOLINE) 25 MG tablet Take 25 mg by mouth 2 (two) times daily.     Historical Provider, MD  isosorbide mononitrate (IMDUR) 30 MG 24 hr tablet Take 1 tablet (30 mg total) by mouth daily. 10/07/15   Florencia Reasons, MD  lamoTRIgine (LAMICTAL) 150 MG tablet Take 1 tablet (150 mg total) by mouth 2 (two) times daily. 12/30/15   Kathlee Nations, MD  lisinopril (PRINIVIL,ZESTRIL) 10 MG tablet Take 0.5 tablets (5 mg total) by mouth daily. 10/07/15   Florencia Reasons, MD  Maltodextrin-Xanthan Gum (RESOURCE THICKENUP CLEAR) POWD Take 120 g by mouth as  needed. Patient not taking: Reported on 03/13/2016 10/07/15   Florencia Reasons, MD  Multiple Vitamin (MULTIVITAMIN WITH MINERALS) TABS tablet Take 1 tablet by mouth daily.    Historical Provider, MD  pantoprazole (PROTONIX) 40 MG tablet Take 40 mg by mouth daily.    Historical Provider, MD  predniSONE (DELTASONE) 2.5 MG tablet Take 2.5 mg by mouth daily with breakfast.    Historical Provider, MD  QUEtiapine (SEROQUEL) 25 MG tablet Take 3 tablets (75 mg total) by mouth at bedtime. 12/30/15   Kathlee Nations, MD  rosuvastatin (CRESTOR) 20 MG tablet Take 20 mg by mouth daily.    Historical Provider, MD  sertraline (ZOLOFT) 100 MG tablet Take 2 tablets (200 mg total) by mouth daily. 10/01/14   Kathlee Nations, MD  Skin Protectants, Misc. (DIMETHICONE-ZINC OXIDE) cream Apply topically 2 (two) times daily as needed for dry skin. Reported on 03/13/2016    Historical Provider, MD  traZODone (DESYREL) 100 MG tablet Take 1 tablet (100 mg total) by mouth at bedtime. 12/30/15   Kathlee Nations, MD    Past Medical History  Diagnosis Date  . Hearing aid worn   . Hypertension   . Parkinson disease ([place])   . Pneumonia   . Diabetes mellitus type II   . Arthritis   . Bipolar disorder ([place])   . Anxiety   . Depression   . MRSA infection (methicillin-resistant Staphylococcus aureus)   . Chronic indwelling Foley catheter   . C2 cervical fracture ([name])     due to pt fall  . SIRS (systemic inflammatory response syndrome) ([name])   . Memory loss   . Benign prostate hyperplasia   . Memory difficulty 08/21/2014  . Neuromuscular disorder ([place])     parkinsons  . Flu   . Falls   . UTI (urinary tract infection)     Past Surgical History  Procedure Laterality Date  . Total hip arthroplasty    . Video bronchoscopy  12/16/2012    Procedure: VIDEO BRONCHOSCOPY;  Surgeon: Melrose Nakayama, MD;  Location: [place];  Service: Thoracic;  Laterality: Right;  . Video assisted thoracoscopy (vats)/empyema  12/16/2012    Procedure: VIDEO  ASSISTED THORACOSCOPY (VATS)/EMPYEMA;  Surgeon: Melrose Nakayama, MD;  Location: [facility] OR;  Service: Thoracic;  Laterality: Right;    Family history: family history includes Cancer in his mother; Depression in his father; Heart disease in his mother.  Social History: Patient lives at [facility].  His POA is a friend of the family.  He does not smoke.  He uses a wheelchiar, or a walker to assist in transfers.  He was a Company secretary. He is from [place] originally.  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  Physical Exam: BP 142/74 mmHg  Pulse 68  Temp(Src) 98.2 F (36.8 C) (Oral)  Resp 20  SpO2 98% General appearance: Frail adult male, alert and in no acute distress.   Eyes: Anicteric, conjunctiva red, lids and lashes normal.     ENT: No nasal deformity, discharge, or epistaxis.  OP moist without lesions.   Lymph: No cervical or supraclavicular lymphadenopathy. Skin: Warm and dry.  Bruises all over.  No mottling. Cardiac: RRR, nl S1-S2, no murmurs appreciated.  Capillary refill is brisk.  JVP normal.  No LE edema.  Radial and DP pulses 2+ and symmetric. Respiratory: Normal respiratory rate and rhythm.  CTAB without rales or wheezes. Abdomen: Abdomen soft without rigidity.  No TTP. No ascites, distension.   MSK: No deformities or effusions. Neuro: Oriented to "[facility]" but thinks he is at [facility], which is not normal per POA.  Thinks year is 2016, also not normal per POA.  Sensorium appears intact and otherwise responding to questions, and commands, attention normal.  Speech is fluent.  Moves all extremities equally and with normal coordination.   Cranial nerves normal. Psych: Behavior appropriate.  Affect normal.  No evidence of aural or visual hallucinations or delusions.       Labs on Admission:  The metabolic panel shows elevated BUN to creatinin ratio, stable renal function. Lactate normal. The complete blood count shows no leukocytosis, anemia or  thrombocytopenia.   Radiological Exams on Admission: Personally reviewed: Dg Chest 2 View  03/13/2016  CLINICAL DATA:  Increased falls the past few days with generalized weakness. EXAM: CHEST  2 VIEW COMPARISON:  Chest x-rays dated 03/01/2016 and 09/21/2014. FINDINGS: The small opacity within the right mid lung region is stable compared to the earlier exam of 09/21/2014, probably recurrent atelectasis and/or small amount of fluid within the minor fissure. No new lung findings. Cardiomegaly is stable. No evidence of active CHF at this time. Multiple old healed rib fractures again noted on the left. Extensive degenerative change again seen at the bilateral shoulders. No acute-appearing osseous abnormality. IMPRESSION: Stable chest x-ray.  No acute findings Electronically Signed   By: Franki Cabot M.D.   On: 03/13/2016 13:40    EKG: Independently reviewed. Rate 57, QTc 433, sinus without ST changes.    Assessment/Plan 1. Positive blood culture:  This is new.  Either true bacteremia or contaminant.  GPC in 1/2 blood cultures, no clear growth in urine culture from yesterday.  No evidence on my exam or CXR of pneumonia.  Has hx of MRSA, enterococcus and MSSA urine cultures.  Will treat as bacteremia, possible staph at present. -Vancomycin and cefazolin for possible GPC bacteremia -Await speciation and sensitivities, de-escalate if CoNS -MIVF overnight  2. Dementia and Parkinson's disease:  Stable -Continue home Sinemet and entacapone  3. Bipolar disorder:  Stable -Continue home lamictal, quetiapine, and sertaline and clonazepam and trazodone  4. NIDDM:  Not on home orals or insulin. -Sensitive sliding scale  5. HTN:  -Continue home amlodipine, hydralazine and Imdur with hold parameters -Hold home lisinopril for now  6. ID anemia:  -Continue home iron  7. GERD:  -Continue home PPI     DVT PPx: Lovenox Diet: heart healthy, diabetic Consultants: None Code Status: DO NOT  RESUSCITATE Family Communication: POA at bedside, differential and overnight plan discussed, all questions answered.  CODE STATUS confirmed.  Medical decision making: What exists of the patient's previous chart was reviewed in depth and the case was discussed with Dr. Tommy Medal  from ID strictly by phone regarding abx coverage in general and Delrae Rend. Patient seen 8:16 PM on 03/14/2016.  Disposition Plan:  I recommend admission to medical surgical unit.  Clinical condition: stable, improving.  Anticipate IV antibiotics and awaiting culture results.      Edwin Dada [facility] Pager [phone]

## 2016-03-14 NOTE — ED Provider Notes (Signed)
CSN: [id number]     Arrival date & time 03/14/16  1137 History   First MD Initiated Contact with Patient 03/14/16 1732     Chief Complaint  Patient presents with  . Abnormal Lab    Positive blood cultures   HPI   [name] is an 81 y.o. male who presents from [facility] for positive blood cultures. He is accompanied by his POA who provides his history. Pt was seen in the ED yesterday for evaluation of weakness, confusion, lethargy. He was diagnosed with a UTI (chronic indwelling foley) and given a dose of Levaquin IV in the ED and discharged home with PO Keflex. Per pt's POA, pt mental status and physical ability much improved from initial presentation yesterday. However, they were called today to come to the ED for admission due to positive blood cultures. EMR review pt grew out one out of two positive cultures with gram positive cocci in clusters.   In the ED pt is resting comfortably in NAD. He is chronically ill appearing. Oriented to person and place, states the year is "20-something." His family member states his mentation is at baseline. Pt has no complaints today.  Past Medical History  Diagnosis Date  . Hearing aid worn   . Hypertension   . Parkinson disease ([place])   . Pneumonia   . Diabetes mellitus type II   . Arthritis   . Bipolar disorder ([place])   . Anxiety   . Depression   . MRSA infection (methicillin-resistant Staphylococcus aureus)   . Chronic indwelling Foley catheter   . C2 cervical fracture ([name])     due to pt fall  . SIRS (systemic inflammatory response syndrome) ([name])   . Memory loss   . Benign prostate hyperplasia   . Memory difficulty 08/21/2014  . Neuromuscular disorder ([place])     parkinsons  . Flu   . Falls   . UTI (urinary tract infection)    Past Surgical History  Procedure Laterality Date  . Total hip arthroplasty    . Video bronchoscopy  12/16/2012    Procedure: VIDEO BRONCHOSCOPY;  Surgeon: Melrose Nakayama, MD;  Location: [facility];   Service: Thoracic;  Laterality: Right;  . Video assisted thoracoscopy (vats)/empyema  12/16/2012    Procedure: VIDEO ASSISTED THORACOSCOPY (VATS)/EMPYEMA;  Surgeon: Melrose Nakayama, MD;  Location: [place];  Service: Thoracic;  Laterality: Right;   Family History  Problem Relation Age of Onset  . Depression Father   . Cancer Mother   . Heart disease Mother    Social History  Substance Use Topics  . Smoking status: Never Smoker   . Smokeless tobacco: Never Used  . Alcohol Use: No     Comment: occasional    Review of Systems  All other systems reviewed and are negative.     Allergies  Cholestatin  Home Medications   Prior to Admission medications   Medication Sig Start Date End Date Taking? Authorizing Provider  acetaminophen (TYLENOL) 325 MG tablet Take 650 mg by mouth every 6 (six) hours as needed for mild pain.    Historical Provider, MD  albuterol (PROVENTIL) (2.5 MG/3ML) 0.083% nebulizer solution Take 3 mLs (2.5 mg total) by nebulization every 2 (two) hours as needed for wheezing or shortness of breath. 04/21/14   Hosie Poisson, MD  amLODipine (NORVASC) 5 MG tablet Take 5 mg by mouth daily.    Historical Provider, MD  amoxicillin-clavulanate (AUGMENTIN) 875-125 MG tablet Take 1 tablet by mouth 2 (  two) times daily. Patient not taking: Reported on 03/13/2016 10/07/15   Florencia Reasons, MD  aspirin 81 MG chewable tablet Chew 81 mg by mouth daily.    Historical Provider, MD  carbidopa-levodopa (SINEMET IR) 25-250 MG per tablet Take 1 tablet by mouth 3 (three) times daily.    Historical Provider, MD  cephALEXin (KEFLEX) 500 MG capsule Take 1 capsule (500 mg total) by mouth 4 (four) times daily. 03/13/16   Hanna Patel-Mills, PA-C  clonazePAM (KLONOPIN) 0.5 MG tablet Take 0.25mg  at noon and 0.5mg  at bedtime Patient taking differently: Take 0.5 mg by mouth at bedtime. Take 0.25mg  at noon and 0.5mg  at bedtime 12/30/15   Kathlee Nations, MD  clopidogrel (PLAVIX) 75 MG tablet Take 1 tablet (75 mg  total) by mouth daily. Patient not taking: Reported on 03/13/2016 10/07/15   Florencia Reasons, MD  entacapone (COMTAN) 200 MG tablet Take 200 mg by mouth 3 (three) times daily.    Historical Provider, MD  ferrous sulfate 325 (65 FE) MG tablet Take 325 mg by mouth 2 (two) times daily.    Historical Provider, MD  GLUCERNA (GLUCERNA) LIQD Take 237 mLs by mouth daily.     Historical Provider, MD  hydrALAZINE (APRESOLINE) 25 MG tablet Take 25 mg by mouth 2 (two) times daily.     Historical Provider, MD  isosorbide mononitrate (IMDUR) 30 MG 24 hr tablet Take 1 tablet (30 mg total) by mouth daily. 10/07/15   Florencia Reasons, MD  lamoTRIgine (LAMICTAL) 150 MG tablet Take 1 tablet (150 mg total) by mouth 2 (two) times daily. 12/30/15   Kathlee Nations, MD  lisinopril (PRINIVIL,ZESTRIL) 10 MG tablet Take 0.5 tablets (5 mg total) by mouth daily. 10/07/15   Florencia Reasons, MD  Maltodextrin-Xanthan Gum (RESOURCE THICKENUP CLEAR) POWD Take 120 g by mouth as needed. Patient not taking: Reported on 03/13/2016 10/07/15   Florencia Reasons, MD  Multiple Vitamin (MULTIVITAMIN WITH MINERALS) TABS tablet Take 1 tablet by mouth daily.    Historical Provider, MD  oseltamivir (TAMIFLU) 75 MG capsule Take 75 mg by mouth daily.    Historical Provider, MD  pantoprazole (PROTONIX) 40 MG tablet Take 40 mg by mouth daily.    Historical Provider, MD  predniSONE (DELTASONE) 2.5 MG tablet Take 2.5 mg by mouth daily with breakfast.    Historical Provider, MD  QUEtiapine (SEROQUEL) 25 MG tablet Take 3 tablets (75 mg total) by mouth at bedtime. 12/30/15   Kathlee Nations, MD  rosuvastatin (CRESTOR) 20 MG tablet Take 20 mg by mouth daily.    Historical Provider, MD  sertraline (ZOLOFT) 100 MG tablet Take 2 tablets (200 mg total) by mouth daily. 10/01/14   Kathlee Nations, MD  Skin Protectants, Misc. (DIMETHICONE-ZINC OXIDE) cream Apply topically 2 (two) times daily as needed for dry skin. Reported on 03/13/2016    Historical Provider, MD  traZODone (DESYREL) 100 MG tablet Take  1 tablet (100 mg total) by mouth at bedtime. 12/30/15   Kathlee Nations, MD   BP 117/73 mmHg  Pulse 66  Temp(Src) 98.2 F (36.8 C) (Oral)  Resp 18  SpO2 100% Physical Exam  Constitutional: No distress.  Thin, chronically ill appearing  HENT:  Right Ear: External ear normal.  Left Ear: External ear normal.  Nose: Nose normal.  Mouth/Throat: Oropharynx is clear and moist.  Eyes: Conjunctivae and EOM are normal. Pupils are equal, round, and reactive to light.  Neck: Normal range of motion. Neck supple.  Cardiovascular: Normal rate,  regular rhythm, normal heart sounds and intact distal pulses.   Pulmonary/Chest: Effort normal and breath sounds normal. No respiratory distress. He has no wheezes.  Abdominal: Soft. Bowel sounds are normal. He exhibits no distension. There is no tenderness.  Musculoskeletal: He exhibits no edema.  Neurological: He is alert. No cranial nerve deficit.  Skin: Skin is warm and dry. He is not diaphoretic.  Psychiatric: He has a normal mood and affect.  Nursing note and vitals reviewed.   ED Course  Procedures (including critical care time) Labs Review Labs Reviewed  BASIC METABOLIC PANEL - Abnormal; Notable for the following:    Glucose, Bld 139 (*)    BUN 23 (*)    Calcium 8.7 (*)    GFR calc non Af Amer 60 (*)    All other components within normal limits  CBC WITH DIFFERENTIAL/PLATELET - Abnormal; Notable for the following:    RBC 4.07 (*)    Hemoglobin 11.6 (*)    HCT 37.3 (*)    All other components within normal limits  URINE CULTURE  URINALYSIS, ROUTINE W REFLEX MICROSCOPIC (NOT AT [facility])  I-STAT CG4 LACTIC ACID, ED    Imaging Review Dg Chest 2 View  03/13/2016  CLINICAL DATA:  Increased falls the past few days with generalized weakness. EXAM: CHEST  2 VIEW COMPARISON:  Chest x-rays dated 03/01/2016 and 09/21/2014. FINDINGS: The small opacity within the right mid lung region is stable compared to the earlier exam of 09/21/2014, probably  recurrent atelectasis and/or small amount of fluid within the minor fissure. No new lung findings. Cardiomegaly is stable. No evidence of active CHF at this time. Multiple old healed rib fractures again noted on the left. Extensive degenerative change again seen at the bilateral shoulders. No acute-appearing osseous abnormality. IMPRESSION: Stable chest x-ray.  No acute findings Electronically Signed   By: Franki Cabot M.D.   On: 03/13/2016 13:40   I have personally reviewed and evaluated these images and lab results as part of my medical decision-making.   EKG Interpretation None      MDM   Final diagnoses:  Bacteremia    Discussed with attending MD Vanita Panda. Will check basic labs. Start on IV levaquin as that is what pt received in the ED yesterday. Will likely call hospitalist team for medical admission for bacteremia.  CBC and BMP at baseline. UA and lactic added per nursing protocol. Lactic negative. Pt is afebrile, hemodynamically stable. Will call hospitalist for admission.   I spoke to Dr. Loleta Books who will admit pt to med-surg.    Anne Ng, PA-C 03/14/16 1927  Carmin Muskrat, MD 03/14/16 2159

## 2016-03-14 NOTE — Telephone Encounter (Incomplete)
Post ED Visit - Positive Culture Follow-up: Successful Patient Follow-Up  Culture assessed and recommendations reviewed by: []  Elenor Quinones, Pharm.D. []  Heide Guile, Pharm.D., BCPS []  Parks Neptune, Pharm.D. []  Alycia Rossetti, Pharm.D., BCPS []  Cody, Pharm.D., BCPS, AAHIVP []  Legrand Como, Pharm.D., BCPS, AAHIVP []  Milus Glazier, Pharm.D. []  Rob Evette Doffing, Pharm.D.  Positive blood culture gram + cocci anaerobic and aerobic culture x 1 set  []  Patient discharged without antimicrobial prescription and treatment is now indicated []  Organism is resistant to prescribed ED discharge antimicrobial [x]  Patient with positive blood cultures  Changes discussed with ED provider: Laneta Simmers Return to ED for probable admit/IV antibiotics Spoke with [facility] staff and emergency contact    Hazle Nordmann 03/14/2016, 9:50 AM

## 2016-03-15 DIAGNOSIS — N3000 Acute cystitis without hematuria: Secondary | ICD-10-CM | POA: Diagnosis not present

## 2016-03-15 DIAGNOSIS — G20 Parkinson's disease: Secondary | ICD-10-CM | POA: Diagnosis not present

## 2016-03-15 DIAGNOSIS — Z9289 Personal history of other medical treatment: Secondary | ICD-10-CM

## 2016-03-15 DIAGNOSIS — R7881 Bacteremia: Secondary | ICD-10-CM | POA: Diagnosis not present

## 2016-03-15 DIAGNOSIS — N390 Urinary tract infection, site not specified: Secondary | ICD-10-CM | POA: Diagnosis not present

## 2016-03-15 LAB — BASIC METABOLIC PANEL
Anion gap: 9 (ref 5–15)
BUN: 23 mg/dL — AB (ref 6–20)
CHLORIDE: 113 mmol/L — AB (ref 101–111)
CO2: 23 mmol/L (ref 22–32)
Calcium: 8.6 mg/dL — ABNORMAL LOW (ref 8.9–10.3)
Creatinine, Ser: 1.04 mg/dL (ref 0.61–1.24)
GFR calc Af Amer: >60 mL/min (ref >60)
GFR calc non Af Amer: >60 mL/min (ref >60)
GLUCOSE: 92 mg/dL (ref 65–99)
POTASSIUM: 4.1 mmol/L (ref 3.5–5.1)
SODIUM: 145 mmol/L (ref 135–145)

## 2016-03-15 LAB — MRSA PCR SCREENING: MRSA by PCR: POSITIVE — AB

## 2016-03-15 LAB — GLUCOSE, CAPILLARY
GLUCOSE-CAPILLARY: 98 mg/dL (ref 65–99)
GLUCOSE-CAPILLARY: 143 mg/dL — AB (ref 65–99)
GLUCOSE-CAPILLARY: 104 mg/dL — AB (ref 65–99)
Glucose-Capillary: 152 mg/dL — ABNORMAL HIGH (ref 65–99)
Glucose-Capillary: 80 mg/dL (ref 65–99)

## 2016-03-15 LAB — CBC
HEMATOCRIT: 35.4 % — AB (ref 39.0–52.0)
Hemoglobin: 11.2 g/dL — ABNORMAL LOW (ref 13.0–17.0)
MCH: 28.9 pg (ref 26.0–34.0)
MCHC: 31.6 g/dL (ref 30.0–36.0)
MCV: 91.2 fL (ref 78.0–100.0)
Platelets: 230 10*3/uL (ref 150–400)
RBC: 3.88 MIL/uL — ABNORMAL LOW (ref 4.22–5.81)
RDW: 14.3 % (ref 11.5–15.5)
WBC: 7.8 10*3/uL (ref 4.0–10.5)

## 2016-03-15 LAB — URINE CULTURE

## 2016-03-15 MED ORDER — CIPROFLOXACIN HCL 500 MG PO TABS
500.0000 mg | ORAL_TABLET | Freq: Two times a day (BID) | ORAL | Status: DC
  Administered 2016-03-15 – 2016-03-16 (×2): 500 mg via ORAL
  Filled 2016-03-15 (×2): qty 1

## 2016-03-15 MED ORDER — CHLORHEXIDINE GLUCONATE CLOTH 2 % EX PADS
6.0000 | MEDICATED_PAD | Freq: Every day | CUTANEOUS | Status: DC
  Administered 2016-03-15 – 2016-03-16 (×2): 6 via TOPICAL

## 2016-03-15 MED ORDER — MUPIROCIN 2 % EX OINT
1.0000 "application " | TOPICAL_OINTMENT | Freq: Two times a day (BID) | CUTANEOUS | Status: DC
  Administered 2016-03-15 – 2016-03-16 (×3): 1 via NASAL
  Filled 2016-03-15 (×2): qty 22

## 2016-03-15 MED ORDER — LISINOPRIL 10 MG PO TABS
10.0000 mg | ORAL_TABLET | Freq: Every day | ORAL | Status: DC
  Administered 2016-03-15 – 2016-03-16 (×2): 10 mg via ORAL
  Filled 2016-03-15 (×2): qty 1

## 2016-03-15 NOTE — Progress Notes (Signed)
[facility] PROGRESS NOTE  Amere Westrope Forgy [id number] DOB: 31-Aug-1935 DOA: 03/14/2016 PCP: No primary care provider on file.  Assessment/Plan: 1. Urinary tract infection -Patient recently presented with a functional decline to the emergency department where he was diagnosed with a urinary tract infection. He has a history of recurrent urinary tract infections in setting of chronic indwelling Foley catheter. -Urine cultures growing Citrobacter. This organism was resistant to cefazolin and ceftriaxone, sensitive to ciprofloxacin and imipenem, nitrofurantoin and Bactrim. -He was afebrile and hemodynamically stable for which she was started on ciprofloxacin 500 mg by mouth twice a day.  2.  Positive blood cultures. -Microbiology reporting coag was negative Staphylococcus species growing from one out of 2 blood cultures. -I suspect this represented a contaminant -Urine cultures were positive for Citrobacter -His antimicrobials will be descalated today with discontinuation of vancomycin and cefazolin and starting ciprofloxacin.  3.  Stage I decubitus ulcer. -Present on admission, at baseline patient having poor functional status.  4.  History of Parkinson's disease. -Stable continue Sinemet and entacapone  5.  Hypertension. -Continue amlodipine 5 mg by mouth daily, isosorbide mononitrate 30 mg by mouth daily, hydralazine 25 mg by mouth twice a day, restarting lisinopril 10 mg PO q daily   Code Status: DNR Family Communication:  Disposition Plan:    Antibiotics:  Cipro 500 mg PO BID  HPI/Subjective: Mr Tortoriello is a 81 year old male with a history of Parkinson's disease, chronic indwelling Foley catheter, stage I decubitus ulcer, presented as a transfer from [facility] with complaints of generalized weakness associate with confusion on 03/13/2016. He was diagnosed with urinary tract infection and discharged on cephalexin. Blood cultures drawn at the time were +1 out of 2  bottles for gram-positive cocci and recommended he come back to the hospital for admission. Initially he was started on broad-spectrum IV antimicrobial therapy with vancomycin and cefazolin. Urine cultures growing Citrobacter, organism susceptible to fluoroquinolones, nitrofurantoin and Bactrim. Was resistant to ceftriaxone and cefazolin. With regard to blood cultures, one out of 2 growing coag-negative Staphylococcus likely representing contaminant. Antimicrobial regimen was narrowed to ciprofloxacin 500 mg by mouth twice a day.  Objective: Filed Vitals:   03/15/16 0621 03/15/16 1107  BP: 171/67 133/75  Pulse: 72   Temp: 97.7 F (36.5 C)   Resp:      Intake/Output Summary (Last 24 hours) at 03/15/16 1459 Last data filed at 03/15/16 1341  Gross per 24 hour  Intake 2723.33 ml  Output   2140 ml  Net 583.33 ml   Filed Weights   03/14/16 2041  Weight: 72.5 kg (159 lb 13.3 oz)    Exam:   General:  Patient is nontoxic appearing, awake and alert   Cardiovascular: Regular rate rhythm normal S1-S2 no murmurs rubs or gallops   Respiratory: Normal respiratory effort lungs are clear   Abdomen: Soft nontender   Musculoskeletal: Stage I decubitus ulcer  Data Reviewed: Basic Metabolic Panel:  Recent Labs Lab 03/13/16 1245 03/14/16 1823 03/15/16 0603  NA 139 141 145  K 4.3 4.3 4.1  CL 107 111 113*  CO2 22 23 23   GLUCOSE 127* 139* 92  BUN 25* 23* 23*  CREATININE 1.22 1.13 1.04  CALCIUM 9.0 8.7* 8.6*   Liver Function Tests:  Recent Labs Lab 03/13/16 1245  AST 21  ALT 6*  ALKPHOS 82  BILITOT 0.8  PROT 6.0*  ALBUMIN 3.6   No results for input(s): LIPASE, AMYLASE in the last 168 hours. No results for input(s): AMMONIA in  the last 168 hours. CBC:  Recent Labs Lab 03/13/16 1245 03/14/16 1823 03/15/16 0603  WBC 13.1* 8.1 7.8  NEUTROABS 11.4* 6.5  --   HGB 12.3* 11.6* 11.2*  HCT 38.4* 37.3* 35.4*  MCV 91.4 91.6 91.2  PLT 240 257 230   Cardiac Enzymes: No  results for input(s): CKTOTAL, CKMB, CKMBINDEX, TROPONINI in the last 168 hours. BNP (last 3 results)  Recent Labs  09/30/15 0740 10/05/15 1524  BNP 85.5 376.6*    ProBNP (last 3 results) No results for input(s): PROBNP in the last 8760 hours.  CBG:  Recent Labs Lab 03/14/16 2116 03/14/16 2234 03/15/16 0742 03/15/16 1149  GLUCAP 69 152* 80 98    Recent Results (from the past 240 hour(s))  Culture, blood (routine x 2)     Status: None (Preliminary result)   Collection Time: 03/13/16 12:30 PM  Result Value Ref Range Status   Specimen Description BLOOD LEFT ANTECUBITAL  Final   Special Requests BOTTLES DRAWN AEROBIC AND ANAEROBIC 5CC  Final   Culture  Setup Time   Final    GRAM POSITIVE COCCI IN CHAINS IN CLUSTERS IN BOTH AEROBIC AND ANAEROBIC BOTTLES CRITICAL RESULT CALLED TO, READ BACK BY AND VERIFIED WITH: L MILLER,RN AT 0816 03/14/16 BY L BENFIELD    Culture STAPHYLOCOCCUS SPECIES (COAGULASE NEGATIVE)  Final   Report Status PENDING  Incomplete  Culture, blood (routine x 2)     Status: None (Preliminary result)   Collection Time: 03/13/16  2:22 PM  Result Value Ref Range Status   Specimen Description BLOOD BLOOD RIGHT FOREARM  Final   Special Requests BOTTLES DRAWN AEROBIC AND ANAEROBIC 6CC  Final   Culture NO GROWTH 2 DAYS  Final   Report Status PENDING  Incomplete  Urine culture     Status: None   Collection Time: 03/13/16  3:37 PM  Result Value Ref Range Status   Specimen Description URINE, CATHETERIZED  Final   Special Requests NONE  Final   Culture >=100,000 COLONIES/mL CITROBACTER YOUNGAE  Final   Report Status 03/15/2016 FINAL  Final   Organism ID, Bacteria CITROBACTER YOUNGAE  Final      Susceptibility   Citrobacter youngae - MIC*    CEFAZOLIN >=64 RESISTANT Resistant     CEFTRIAXONE >=64 RESISTANT Resistant     CIPROFLOXACIN <=0.25 SENSITIVE Sensitive     GENTAMICIN <=1 SENSITIVE Sensitive     IMIPENEM 1 SENSITIVE Sensitive     NITROFURANTOIN <=16  SENSITIVE Sensitive     TRIMETH/SULFA <=20 SENSITIVE Sensitive     PIP/TAZO >=128 RESISTANT Resistant     * >=100,000 COLONIES/mL CITROBACTER YOUNGAE  MRSA PCR Screening     Status: Abnormal   Collection Time: 03/14/16  9:08 PM  Result Value Ref Range Status   MRSA by PCR POSITIVE (A) NEGATIVE Final    Comment:        The GeneXpert MRSA Assay (FDA approved for NASAL specimens only), is one component of a comprehensive MRSA colonization surveillance program. It is not intended to diagnose MRSA infection nor to guide or monitor treatment for MRSA infections. RESULT CALLED TO, READ BACK BY AND VERIFIED WITH: C SISON,RN @0058  03/15/16 MKELLY   Urine culture     Status: None (Preliminary result)   Collection Time: 03/14/16 10:57 PM  Result Value Ref Range Status   Specimen Description URINE, RANDOM  Final   Special Requests NONE  Final   Culture NO GROWTH < 12 HOURS  Final   Report Status  PENDING  Incomplete     Studies: No results found.  Scheduled Meds: . amLODipine  5 mg Oral Daily  . aspirin  81 mg Oral Daily  . carbidopa-levodopa  1 tablet Oral TID  . Chlorhexidine Gluconate Cloth  6 each Topical Q0600  . ciprofloxacin  500 mg Oral BID  . clonazePAM  0.5 mg Oral QHS  . clopidogrel  75 mg Oral Daily  . enoxaparin (LOVENOX) injection  40 mg Subcutaneous Q24H  . entacapone  200 mg Oral TID  . feeding supplement (GLUCERNA SHAKE)  237 mL Oral Daily  . ferrous sulfate  325 mg Oral BID  . hydrALAZINE  25 mg Oral BID  . insulin aspart  0-5 Units Subcutaneous QHS  . insulin aspart  0-9 Units Subcutaneous TID WC  . isosorbide mononitrate  30 mg Oral Daily  . lamoTRIgine  150 mg Oral BID  . mupirocin ointment  1 application Nasal BID  . pantoprazole  40 mg Oral Daily  . predniSONE  2.5 mg Oral Q breakfast  . QUEtiapine  75 mg Oral QHS  . rosuvastatin  20 mg Oral Daily  . sertraline  200 mg Oral Daily  . traZODone  100 mg Oral QHS   Continuous Infusions:   Principal  Problem:   Bacteremia Active Problems:   Parkinson disease ([place])   Diabetes mellitus, type 2 ([place])   Bipolar disorder ([name])   Chronic indwelling Foley catheter   Chronic diastolic congestive heart failure ([name])   Malnutrition of moderate degree ([place])   Sacral pressure ulcer   Dementia without behavioral disturbance    Time spent: 35 min    Corwyn Vora  [facility] Pager [phone]. If 7PM-7AM, please contact night-coverage at [email or web address], password [facility] 03/15/2016, 2:59 PM  LOS: 1 day

## 2016-03-16 DIAGNOSIS — R7881 Bacteremia: Secondary | ICD-10-CM | POA: Diagnosis not present

## 2016-03-16 DIAGNOSIS — L89151 Pressure ulcer of sacral region, stage 1: Secondary | ICD-10-CM | POA: Diagnosis not present

## 2016-03-16 DIAGNOSIS — N390 Urinary tract infection, site not specified: Secondary | ICD-10-CM | POA: Diagnosis not present

## 2016-03-16 DIAGNOSIS — N3000 Acute cystitis without hematuria: Secondary | ICD-10-CM | POA: Diagnosis not present

## 2016-03-16 DIAGNOSIS — G20 Parkinson's disease: Secondary | ICD-10-CM | POA: Diagnosis not present

## 2016-03-16 LAB — CBC
HCT: 32.7 % — ABNORMAL LOW (ref 39.0–52.0)
Hemoglobin: 10.5 g/dL — ABNORMAL LOW (ref 13.0–17.0)
MCH: 28.9 pg (ref 26.0–34.0)
MCHC: 32.1 g/dL (ref 30.0–36.0)
MCV: 90.1 fL (ref 78.0–100.0)
PLATELETS: 210 10*3/uL (ref 150–400)
RBC: 3.63 MIL/uL — ABNORMAL LOW (ref 4.22–5.81)
RDW: 14.3 % (ref 11.5–15.5)
WBC: 7.2 10*3/uL (ref 4.0–10.5)

## 2016-03-16 LAB — BASIC METABOLIC PANEL
ANION GAP: 6 (ref 5–15)
BUN: 18 mg/dL (ref 6–20)
CALCIUM: 8.6 mg/dL — AB (ref 8.9–10.3)
CO2: 25 mmol/L (ref 22–32)
CREATININE: 1.17 mg/dL (ref 0.61–1.24)
Chloride: 112 mmol/L — ABNORMAL HIGH (ref 101–111)
GFR calc Af Amer: >60 mL/min (ref >60)
GFR, EST NON AFRICAN AMERICAN: 57 mL/min — AB (ref >60)
GLUCOSE: 93 mg/dL (ref 65–99)
Potassium: 3.9 mmol/L (ref 3.5–5.1)
Sodium: 143 mmol/L (ref 135–145)

## 2016-03-16 LAB — URINE CULTURE: Culture: 2000

## 2016-03-16 LAB — GLUCOSE, CAPILLARY
GLUCOSE-CAPILLARY: 88 mg/dL (ref 65–99)
GLUCOSE-CAPILLARY: 85 mg/dL (ref 65–99)

## 2016-03-16 LAB — CULTURE, BLOOD (ROUTINE X 2)

## 2016-03-16 MED ORDER — CIPROFLOXACIN HCL 500 MG PO TABS: 500.0000 mg | ORAL_TABLET | Freq: Two times a day (BID) | ORAL | Status: DC

## 2016-03-16 MED ORDER — GLUCERNA SHAKE PO LIQD: 237.0000 mL | Freq: Two times a day (BID) | ORAL | Status: AC

## 2016-03-16 MED ORDER — CLONAZEPAM 0.5 MG PO TABS: ORAL_TABLET | ORAL | Status: DC

## 2016-03-16 NOTE — NC FL2 (Signed)
[place] LEVEL OF CARE SCREENING TOOL     IDENTIFICATION  Patient Name: [name] Birthdate: Dec 08, 1935 Sex: male Admission Date (Current Location): 03/14/2016  [facility] and [place] Number:  Herbalist and Address:  The . [facility], [place] [address], [place], [place] [address]      Provider Number: [id number]  Attending Physician Name and Address:  Kelvin Cellar, MD  Relative Name and Phone Number:       Current Level of Care: SNF Recommended Level of Care: [place] Prior Approval Number:    Date Approved/Denied:   PASRR Number: [id number]  Discharge Plan: SNF    Current Diagnoses: Patient Active Problem List   Diagnosis Date Noted  . Bacteremia 03/14/2016  . HCAP (healthcare-associated pneumonia)   . Pressure ulcer 10/01/2015  . Right lower lobe pneumonia 10/01/2015  . Sepsis due to Gram-negative organism with acute respiratory failure ([place]) 10/01/2015  . Acute renal failure ([place]) 10/01/2015  . Acute respiratory failure with hypoxia ([place]) 09/30/2015  . Influenza A (H1N1) 03/25/2015  . Dementia without behavioral disturbance 03/25/2015  . Hyperkalemia 03/25/2015  . Fall 03/16/2015  . Sacral pressure ulcer 03/16/2015  . Fever 03/16/2015  . Memory difficulty 08/21/2014  . Malnutrition of moderate degree ([place]) 04/19/2014  . UTI (lower urinary tract infection) 04/18/2014  . Hypoxia 04/18/2014  . PNA (pneumonia) 04/18/2014  . Chronic diastolic congestive heart failure ([place]) 02/01/2013  . Pericardial effusion 02/01/2013  . Bradycardia 01/31/2013  . Empyema lung ([place]) 12/23/2012  . S/P thoracotomy 12/23/2012  . Abnormality of gait 12/05/2012  . Hypernatremia 08/13/2012  . Hypertension   . Diabetes mellitus, type 2 ([place])   . Arthritis   . Bipolar disorder ([place])   . Anxiety   . Chronic indwelling Foley catheter   . Neuromuscular disorder ([place])   . Bacteremia of undetermined etiology 06/17/2012   . Parkinson disease ([place]) 04/16/2012  . Elevated troponin 04/16/2012  . Hearing aid worn   . Diabetes mellitus ([place]) 07/06/2011    Orientation RESPIRATION BLADDER Height & Weight     Self, Time, Situation  Normal Incontinent Weight: 159 lb 13.3 oz (72.5 kg) Height:  5\' 11"  (180.3 cm)  BEHAVIORAL SYMPTOMS/MOOD NEUROLOGICAL BOWEL NUTRITION STATUS      Continent Diet (Regular)  AMBULATORY STATUS COMMUNICATION OF NEEDS Skin    (Standby assist. Does not ambulate.) Verbally PU Stage and Appropriate Care PU Stage 1 Dressing:  (Foam dressing)                     Personal Care Assistance Level of Assistance  Bathing, Feeding, Dressing Bathing Assistance: Limited assistance Feeding assistance: Limited assistance Dressing Assistance: Limited assistance     Functional Limitations Info             SPECIAL CARE FACTORS FREQUENCY   (Hospice)                    Contractures      Additional Factors Info  Allergies, Code Status, Isolation Precautions Code Status Info: DNR Allergies Info: Cholestatin     Isolation Precautions Info: MRSA     Current Medications (03/16/2016):  This is the current hospital active medication list Current Facility-Administered Medications  Medication Dose Route Frequency Provider Last Rate Last Dose  . acetaminophen (TYLENOL) tablet 650 mg  650 mg Oral Q6H PRN Edwin Dada, MD       Or  . acetaminophen (TYLENOL) suppository 650  mg  650 mg Rectal Q6H PRN Edwin Dada, MD      . amLODipine (NORVASC) tablet 5 mg  5 mg Oral Daily Edwin Dada, MD   5 mg at 03/16/16 1048  . aspirin chewable tablet 81 mg  81 mg Oral Daily Edwin Dada, MD   81 mg at 03/16/16 1048  . carbidopa-levodopa (SINEMET IR) 25-250 MG per tablet immediate release 1 tablet  1 tablet Oral TID Edwin Dada, MD   1 tablet at 03/16/16 1048  . Chlorhexidine Gluconate Cloth 2 % PADS 6 each  6 each Topical Q0600 Edwin Dada, MD    6 each at 03/16/16 0555  . ciprofloxacin (CIPRO) tablet 500 mg  500 mg Oral BID Kelvin Cellar, MD   500 mg at 03/16/16 [id number]  . clonazePAM (KLONOPIN) tablet 0.5 mg  0.5 mg Oral QHS Edwin Dada, MD   0.5 mg at 03/15/16 2115  . clopidogrel (PLAVIX) tablet 75 mg  75 mg Oral Daily Edwin Dada, MD   75 mg at 03/16/16 1048  . enoxaparin (LOVENOX) injection 40 mg  40 mg Subcutaneous Q24H Edwin Dada, MD   40 mg at 03/15/16 2115  . entacapone (COMTAN) tablet 200 mg  200 mg Oral TID Edwin Dada, MD   200 mg at 03/16/16 1048  . feeding supplement (GLUCERNA SHAKE) (GLUCERNA SHAKE) liquid 237 mL  237 mL Oral Daily Edwin Dada, MD   237 mL at 03/16/16 1051  . ferrous sulfate tablet 325 mg  325 mg Oral BID Edwin Dada, MD   325 mg at 03/16/16 1048  . hydrALAZINE (APRESOLINE) tablet 25 mg  25 mg Oral BID Edwin Dada, MD   25 mg at 03/16/16 1048  . insulin aspart (novoLOG) injection 0-5 Units  0-5 Units Subcutaneous QHS Edwin Dada, MD   0 Units at 03/14/16 2240  . insulin aspart (novoLOG) injection 0-9 Units  0-9 Units Subcutaneous TID WC Edwin Dada, MD   1 Units at 03/15/16 1800  . isosorbide mononitrate (IMDUR) 24 hr tablet 30 mg  30 mg Oral Daily Edwin Dada, MD   30 mg at 03/16/16 1047  . lamoTRIgine (LAMICTAL) tablet 150 mg  150 mg Oral BID Edwin Dada, MD   150 mg at 03/16/16 1048  . lisinopril (PRINIVIL,ZESTRIL) tablet 10 mg  10 mg Oral Daily Kelvin Cellar, MD   10 mg at 03/16/16 1048  . mupirocin ointment (BACTROBAN) 2 % 1 application  1 application Nasal BID Edwin Dada, MD   1 application at [id number] 1049  . ondansetron (ZOFRAN) tablet 4 mg  4 mg Oral Q6H PRN Edwin Dada, MD       Or  . ondansetron (ZOFRAN) injection 4 mg  4 mg Intravenous Q6H PRN Edwin Dada, MD      . pantoprazole (PROTONIX) EC tablet 40 mg  40 mg Oral Daily Edwin Dada, MD   40 mg  at 03/16/16 1048  . predniSONE (DELTASONE) tablet 2.5 mg  2.5 mg Oral Q breakfast Edwin Dada, MD   2.5 mg at 03/16/16 [id number]  . QUEtiapine (SEROQUEL) tablet 75 mg  75 mg Oral QHS Edwin Dada, MD   75 mg at 03/15/16 2114  . rosuvastatin (CRESTOR) tablet 20 mg  20 mg Oral Daily Edwin Dada, MD   20 mg at 03/16/16 1048  . senna-docusate (Senokot-S) tablet 1 tablet  1 tablet Oral  QHS PRN Edwin Dada, MD      . sertraline (ZOLOFT) tablet 200 mg  200 mg Oral Daily Edwin Dada, MD   200 mg at 03/16/16 1047  . traZODone (DESYREL) tablet 100 mg  100 mg Oral QHS Edwin Dada, MD   100 mg at 03/15/16 2114     Discharge Medications: Please see discharge summary for a list of discharge medications.  Relevant Imaging Results:  Relevant Lab Results:   Additional Information SSN: [id number]  Caroline Sauger, LCSW

## 2016-03-16 NOTE — Discharge Summary (Signed)
Physician Discharge Summary  [name] [id number] DOB: 12/03/1935 DOA: 03/14/2016  PCP: No primary care provider on file.  Admit date: 03/14/2016 Discharge date: 03/16/2016  Time spent: 35 minutes  Recommendations for Outpatient Follow-up:   1. Dwayne [name] diagnosed with UTI, discharged on 1 week course of Cipro  2. He was discharged back to his ALF   Discharge Diagnoses:  Principal Problem:   Bacteremia Active Problems:   Parkinson disease ([place])   Diabetes mellitus, type 2 ([place])   Bipolar disorder ([place])   Chronic indwelling Foley catheter   Chronic diastolic congestive heart failure ([place])   Malnutrition of moderate degree ([place])   Sacral pressure ulcer   Dementia without behavioral disturbance   Discharge Condition: Stable  Diet recommendation: Regular Diet  Filed Weights   03/14/16 2041  Weight: 72.5 kg (159 lb 13.3 oz)    History of present illness:  [name] is a 81 y.o. male with a past medical history significant for Parkinsons with indwelling foley and sacral decubs, dementia and Bipolar disorder, and HTN who presents with bacteremia.  The patient was in his usual state of health at [facility] until a few days ago, per his POA, when he "pulled on his foley" and it got dislodged, and the home health agency had to come out to replace it, at which time some clots and bright red blood returned from his foley. Then, over the las day or so, he was noted to be more confused and weak than usual, and so his POA came and brought him to the ER last night. There, he was noted to have leukocytosis, pyuria, and low grade temperature, was given Levaquin and discharged with cephalexin for UTI. Today, a blood culture drawn the day before grew GPC in 1/2 bottles, and so he was called back. His POA notes that since then he is more alert and less confused, he has no current fever, no more hematuria. There was a mild cough this week, but no dyspnea, sputum or rigors.  No sick contacts.  Hospital Course:  [name] is a 81 year old male with a history of Parkinson's disease, chronic indwelling Foley catheter, stage I decubitus ulcer, presented as a transfer from [facility] with complaints of generalized weakness associate with confusion on 03/13/2016. He was diagnosed with urinary tract infection and discharged on cephalexin. Blood cultures drawn at the time were +1 out of 2 bottles for gram-positive cocci and recommended he come back to the hospital for admission. Initially he was started on broad-spectrum IV antimicrobial therapy with vancomycin and cefazolin. Urine cultures growing Citrobacter, organism susceptible to fluoroquinolones, nitrofurantoin and Bactrim. Was resistant to ceftriaxone and cefazolin. With regard to blood cultures, one out of 2 growing coag-negative Staphylococcus likely representing contaminant. Antimicrobial regimen was narrowed to ciprofloxacin 500 mg by mouth twice a day.  Discharge Exam: Filed Vitals:   03/15/16 1813 03/15/16 2046  BP: 153/69 139/78  Pulse:  58  Temp:  98.9 F (37.2 C)  Resp:  18     General: Patient is nontoxic appearing, awake and alert   Cardiovascular: Regular rate rhythm normal S1-S2 no murmurs rubs or gallops   Respiratory: Normal respiratory effort lungs are clear   Abdomen: Soft nontender   Musculoskeletal: Stage I decubitus ulcer  Data Reviewed:  Discharge Instructions   Discharge Instructions    Call MD for:  difficulty breathing, headache or visual disturbances    Complete by:  As directed      Call  MD for:  extreme fatigue    Complete by:  As directed      Call MD for:  hives    Complete by:  As directed      Call MD for:  persistant dizziness or light-headedness    Complete by:  As directed      Call MD for:  persistant nausea and vomiting    Complete by:  As directed      Call MD for:  redness, tenderness, or signs of infection (pain, swelling, redness, odor or  green/yellow discharge around incision site)    Complete by:  As directed      Call MD for:  severe uncontrolled pain    Complete by:  As directed      Call MD for:  temperature >100.4    Complete by:  As directed      Call MD for:    Complete by:  As directed      Diet - low sodium heart healthy    Complete by:  As directed      Increase activity slowly    Complete by:  As directed           Current Discharge Medication List    START taking these medications   Details  ciprofloxacin (CIPRO) 500 MG tablet Take 1 tablet (500 mg total) by mouth 2 (two) times daily. Qty: 14 tablet, Refills: 0      CONTINUE these medications which have CHANGED   Details  clonazePAM (KLONOPIN) 0.5 MG tablet Take 0.25mg  at noon and 0.5mg  at bedtime Qty: 20 tablet, Refills: 0   Associated Diagnoses: Bipolar 1 disorder ([name])    feeding supplement, GLUCERNA SHAKE, (GLUCERNA SHAKE) LIQD Take 237 mLs by mouth 2 (two) times daily between meals. Qty: 30 Can, Refills: 0      CONTINUE these medications which have NOT CHANGED   Details  acetaminophen (TYLENOL) 325 MG tablet Take 650 mg by mouth every 6 (six) hours as needed for mild pain.    albuterol (PROVENTIL) (2.5 MG/3ML) 0.083% nebulizer solution Take 3 mLs (2.5 mg total) by nebulization every 2 (two) hours as needed for wheezing or shortness of breath. Qty: 75 mL, Refills: 12    amLODipine (NORVASC) 5 MG tablet Take 5 mg by mouth daily.    aspirin 81 MG chewable tablet Chew 81 mg by mouth daily.    carbidopa-levodopa (SINEMET IR) 25-250 MG per tablet Take 1 tablet by mouth 3 (three) times daily.    clopidogrel (PLAVIX) 75 MG tablet Take 1 tablet (75 mg total) by mouth daily. Qty: 30 tablet, Refills: 0    entacapone (COMTAN) 200 MG tablet Take 200 mg by mouth 3 (three) times daily.    ferrous sulfate 325 (65 FE) MG tablet Take 325 mg by mouth 2 (two) times daily.    hydrALAZINE (APRESOLINE) 25 MG tablet Take 25 mg by mouth 2 (two) times  daily.     isosorbide mononitrate (IMDUR) 30 MG 24 hr tablet Take 1 tablet (30 mg total) by mouth daily. Qty: 30 tablet, Refills: 0    lamoTRIgine (LAMICTAL) 150 MG tablet Take 1 tablet (150 mg total) by mouth 2 (two) times daily. Qty: 60 tablet, Refills: 2   Associated Diagnoses: Bipolar 1 disorder ([name])    lisinopril (PRINIVIL,ZESTRIL) 10 MG tablet Take 0.5 tablets (5 mg total) by mouth daily. Qty: 30 tablet, Refills: 0    Maltodextrin-Xanthan Gum (RESOURCE THICKENUP CLEAR) POWD Take 120 g by mouth as  needed. Qty: 30 Can, Refills: 0    Multiple Vitamin (MULTIVITAMIN WITH MINERALS) TABS tablet Take 1 tablet by mouth daily.    pantoprazole (PROTONIX) 40 MG tablet Take 40 mg by mouth daily.    predniSONE (DELTASONE) 2.5 MG tablet Take 2.5 mg by mouth daily with breakfast.    QUEtiapine (SEROQUEL) 25 MG tablet Take 3 tablets (75 mg total) by mouth at bedtime. Qty: 90 tablet, Refills: 2   Associated Diagnoses: Bipolar 1 disorder ([name])    rosuvastatin (CRESTOR) 20 MG tablet Take 20 mg by mouth daily.    sertraline (ZOLOFT) 100 MG tablet Take 2 tablets (200 mg total) by mouth daily. Qty: 60 tablet, Refills: 1   Associated Diagnoses: Bipolar 1 disorder ([name])    Skin Protectants, Misc. (DIMETHICONE-ZINC OXIDE) cream Apply topically 2 (two) times daily as needed for dry skin. Reported on 03/13/2016    traZODone (DESYREL) 100 MG tablet Take 1 tablet (100 mg total) by mouth at bedtime. Qty: 30 tablet, Refills: 2   Associated Diagnoses: Bipolar 1 disorder ([name])       Allergies  Allergen Reactions  . Cholestatin Other (See Comments)    Per MAR      The results of significant diagnostics from this hospitalization (including imaging, microbiology, ancillary and laboratory) are listed below for reference.    Significant Diagnostic Studies: Dg Chest 2 View  03/13/2016  CLINICAL DATA:  Increased falls the past few days with generalized weakness. EXAM: CHEST  2 VIEW COMPARISON:  Chest  x-rays dated 03/01/2016 and 09/21/2014. FINDINGS: The small opacity within the right mid lung region is stable compared to the earlier exam of 09/21/2014, probably recurrent atelectasis and/or small amount of fluid within the minor fissure. No new lung findings. Cardiomegaly is stable. No evidence of active CHF at this time. Multiple old healed rib fractures again noted on the left. Extensive degenerative change again seen at the bilateral shoulders. No acute-appearing osseous abnormality. IMPRESSION: Stable chest x-ray.  No acute findings Electronically Signed   By: Franki Cabot M.D.   On: 03/13/2016 13:40   Dg Chest 2 View  03/01/2016  CLINICAL DATA:  Productive cough for 3-4 days.  Initial encounter. EXAM: CHEST  2 VIEW COMPARISON:  PA and lateral chest 10/05/2015 and 04/17/2014. CT chest 04/18/2014. FINDINGS: There is some unchanged scarring in the right upper lobe. The lungs are otherwise clear. Heart size is mildly enlarged. No pneumothorax or pleural effusion. Multiple remote healed left rib fractures are identified. IMPRESSION: No acute disease. Electronically Signed   By: Inge Rise M.D.   On: 03/01/2016 16:02    Microbiology: Recent Results (from the past 240 hour(s))  Culture, blood (routine x 2)     Status: None   Collection Time: 03/13/16 12:30 PM  Result Value Ref Range Status   Specimen Description BLOOD LEFT ANTECUBITAL  Final   Special Requests BOTTLES DRAWN AEROBIC AND ANAEROBIC 5CC  Final   Culture  Setup Time   Final    GRAM POSITIVE COCCI IN CHAINS IN CLUSTERS IN BOTH AEROBIC AND ANAEROBIC BOTTLES CRITICAL RESULT CALLED TO, READ BACK BY AND VERIFIED WITH: L MILLER,RN AT [id number] 03/14/16 BY L BENFIELD    Culture   Final    STAPHYLOCOCCUS SPECIES (COAGULASE NEGATIVE) VIRIDANS STREPTOCOCCUS THE SIGNIFICANCE OF ISOLATING THIS ORGANISM FROM A SINGLE SET OF BLOOD CULTURES WHEN MULTIPLE SETS ARE DRAWN IS UNCERTAIN. PLEASE NOTIFY THE MICROBIOLOGY DEPARTMENT WITHIN ONE WEEK IF  SPECIATION AND SENSITIVITIES ARE REQUIRED.    Report  Status 03/16/2016 FINAL  Final  Culture, blood (routine x 2)     Status: None (Preliminary result)   Collection Time: 03/13/16  2:22 PM  Result Value Ref Range Status   Specimen Description BLOOD BLOOD RIGHT FOREARM  Final   Special Requests BOTTLES DRAWN AEROBIC AND ANAEROBIC 6CC  Final   Culture NO GROWTH 2 DAYS  Final   Report Status PENDING  Incomplete  Urine culture     Status: None   Collection Time: 03/13/16  3:37 PM  Result Value Ref Range Status   Specimen Description URINE, CATHETERIZED  Final   Special Requests NONE  Final   Culture >=100,000 COLONIES/mL CITROBACTER YOUNGAE  Final   Report Status 03/15/2016 FINAL  Final   Organism ID, Bacteria CITROBACTER YOUNGAE  Final      Susceptibility   Citrobacter youngae - MIC*    CEFAZOLIN >=64 RESISTANT Resistant     CEFTRIAXONE >=64 RESISTANT Resistant     CIPROFLOXACIN <=0.25 SENSITIVE Sensitive     GENTAMICIN <=1 SENSITIVE Sensitive     IMIPENEM 1 SENSITIVE Sensitive     NITROFURANTOIN <=16 SENSITIVE Sensitive     TRIMETH/SULFA <=20 SENSITIVE Sensitive     PIP/TAZO >=128 RESISTANT Resistant     * >=100,000 COLONIES/mL CITROBACTER YOUNGAE  MRSA PCR Screening     Status: Abnormal   Collection Time: 03/14/16  9:08 PM  Result Value Ref Range Status   MRSA by PCR POSITIVE (A) NEGATIVE Final    Comment:        The GeneXpert MRSA Assay (FDA approved for NASAL specimens only), is one component of a comprehensive MRSA colonization surveillance program. It is not intended to diagnose MRSA infection nor to guide or monitor treatment for MRSA infections. RESULT CALLED TO, READ BACK BY AND VERIFIED WITH: C SISON,RN @0058  03/15/16 MKELLY   Urine culture     Status: None (Preliminary result)   Collection Time: 03/14/16 10:57 PM  Result Value Ref Range Status   Specimen Description URINE, RANDOM  Final   Special Requests NONE  Final   Culture NO GROWTH < 12 HOURS  Final    Report Status PENDING  Incomplete     Labs: Basic Metabolic Panel:  Recent Labs Lab 03/13/16 1245 03/14/16 1823 03/15/16 0603 03/16/16 0526  NA 139 141 145 143  K 4.3 4.3 4.1 3.9  CL 107 111 113* 112*  CO2 22 23 23 25   GLUCOSE 127* 139* 92 93  BUN 25* 23* 23* 18  CREATININE 1.22 1.13 1.04 1.17  CALCIUM 9.0 8.7* 8.6* 8.6*   Liver Function Tests:  Recent Labs Lab 03/13/16 1245  AST 21  ALT 6*  ALKPHOS 82  BILITOT 0.8  PROT 6.0*  ALBUMIN 3.6   No results for input(s): LIPASE, AMYLASE in the last 168 hours. No results for input(s): AMMONIA in the last 168 hours. CBC:  Recent Labs Lab 03/13/16 1245 03/14/16 1823 03/15/16 0603 03/16/16 0526  WBC 13.1* 8.1 7.8 7.2  NEUTROABS 11.4* 6.5  --   --   HGB 12.3* 11.6* 11.2* 10.5*  HCT 38.4* 37.3* 35.4* 32.7*  MCV 91.4 91.6 91.2 90.1  PLT 240 257 230 210   Cardiac Enzymes: No results for input(s): CKTOTAL, CKMB, CKMBINDEX, TROPONINI in the last 168 hours. BNP: BNP (last 3 results)  Recent Labs  09/30/15 0740 10/05/15 1524  BNP 85.5 376.6*    ProBNP (last 3 results) No results for input(s): PROBNP in the last 8760 hours.  CBG:  Recent  Labs Lab 03/15/16 0742 03/15/16 1149 03/15/16 1723 03/15/16 2053 03/16/16 0722  GLUCAP 80 98 143* 104* 88       Signed:  Kelvin Cellar MD.  [facility] 03/16/2016, 11:35 AM

## 2016-03-16 NOTE — Clinical Social Work Note (Signed)
Patient admitted from [place]. Per ALF, patient able to return. CSW provided ALF with discharge summary.  Patient's caregiver, [name] [phone]), to provide discharge transportation. RN updated and provided with report number.  CSW signing off.  Lubertha Sayres, [facility]: [phone] Surgical: [phone]

## 2016-03-16 NOTE — Progress Notes (Addendum)
Patient discharged back to [facility] as ordered, transported by caregiver, instructions given and report was given to nurse Wyline Mood.

## 2016-03-18 LAB — CULTURE, BLOOD (ROUTINE X 2): CULTURE: NO GROWTH

## 2016-03-29 ENCOUNTER — Encounter (HOSPITAL_COMMUNITY): Payer: Self-pay | Admitting: Psychiatry

## 2016-03-29 ENCOUNTER — Ambulatory Visit (INDEPENDENT_AMBULATORY_CARE_PROVIDER_SITE_OTHER): Payer: Medicare Other | Admitting: Psychiatry

## 2016-03-29 VITALS — BP 122/68 | HR 56 | Ht 69.0 in | Wt 162.6 lb

## 2016-03-29 DIAGNOSIS — F319 Bipolar disorder, unspecified: Secondary | ICD-10-CM | POA: Diagnosis not present

## 2016-03-29 MED ORDER — QUETIAPINE FUMARATE 25 MG PO TABS: 75.0000 mg | ORAL_TABLET | Freq: Every day | ORAL | Status: DC

## 2016-03-29 MED ORDER — CLONAZEPAM 0.5 MG PO TABS: ORAL_TABLET | ORAL | Status: DC

## 2016-03-29 MED ORDER — LAMOTRIGINE 150 MG PO TABS: 150.0000 mg | ORAL_TABLET | Freq: Two times a day (BID) | ORAL | Status: DC

## 2016-03-29 MED ORDER — TRAZODONE HCL 50 MG PO TABS: 50.0000 mg | ORAL_TABLET | Freq: Every day | ORAL | Status: DC

## 2016-03-29 NOTE — Progress Notes (Signed)
[facility] Progress Note  [name] [id number] 81 y.o.  03/29/2016 4:08 PM  Chief Complaint:  I was admitted to the hospital for UTI.  I have been falling lately.               History of Present Illness:  [name] came for his appointment with his aide.  He was admitted due to UTI and he admitted lately feeling more tired and exhausted.  He also complaining of frequent falls .  He is using walker for ambulation.  I have noticed he has more memory impairment she do not recall very well the details.  Though he admitted taking the medication as prescribed.  He does not know his Klonopin was reduced when he discharged from the hospital which could be due to frequent falls.  He sleeping good.  There were no concerns from his assisted living facility.  He is still taking Seroquel, Lamictal, Zoloft, trazodone and Klonopin.  He denies any irritability, paranoia, hallucination, anger or any crying spells.  He admitted energy level is low.  He denies any major panic attack.  His home health aide told that there has been no recent episode of agitation anger or panic attack.  He has difficulty walking.  He has mild tremors but no other concern.  He is pleasant in conversation.  His appetite is okay.  His vitals are stable.   Suicidal Ideation: No Plan Formed: No Patient has means to carry out plan: No  Homicidal Ideation: No Plan Formed: No Patient has means to carry out plan: No  Review of Systems  Constitutional: Negative.   HENT: Positive for hearing loss.   Musculoskeletal: Positive for back pain.  Skin: Negative.  Negative for itching and rash.  Neurological: Positive for tremors.       Memory impairment, unsteady gait.    Medical history Patient has history of diabetes mellitus, hypertension, Parkinson disease, gait instability, hearing and memory impairment. He has multiple UTIs.  He see Dr. Floyde Parkins.    Outpatient Encounter Prescriptions as of 03/29/2016   Medication Sig  . acetaminophen (TYLENOL) 325 MG tablet Take 650 mg by mouth every 6 (six) hours as needed for mild pain.  Marland Kitchen albuterol (PROVENTIL) (2.5 MG/3ML) 0.083% nebulizer solution Take 3 mLs (2.5 mg total) by nebulization every 2 (two) hours as needed for wheezing or shortness of breath.  Marland Kitchen amLODipine (NORVASC) 5 MG tablet Take 5 mg by mouth daily.  Marland Kitchen aspirin 81 MG chewable tablet Chew 81 mg by mouth daily.  . carbidopa-levodopa (SINEMET IR) 25-250 MG per tablet Take 1 tablet by mouth 3 (three) times daily.  . ciprofloxacin (CIPRO) 500 MG tablet Take 1 tablet (500 mg total) by mouth 2 (two) times daily.  . clonazePAM (KLONOPIN) 0.5 MG tablet Take 0.3m bid as needed  . clopidogrel (PLAVIX) 75 MG tablet Take 1 tablet (75 mg total) by mouth daily. (Patient not taking: Reported on 03/13/2016)  . entacapone (COMTAN) 200 MG tablet Take 200 mg by mouth 3 (three) times daily.  . feeding supplement, GLUCERNA SHAKE, (GLUCERNA SHAKE) LIQD Take 237 mLs by mouth 2 (two) times daily between meals.  . ferrous sulfate 325 (65 FE) MG tablet Take 325 mg by mouth 2 (two) times daily.  . hydrALAZINE (APRESOLINE) 25 MG tablet Take 25 mg by mouth 2 (two) times daily.   . isosorbide mononitrate (IMDUR) 30 MG 24 hr tablet Take 1 tablet (30 mg total) by mouth daily.  .Marland KitchenlamoTRIgine (  LAMICTAL) 150 MG tablet Take 1 tablet (150 mg total) by mouth 2 (two) times daily.  Marland Kitchen lisinopril (PRINIVIL,ZESTRIL) 10 MG tablet Take 0.5 tablets (5 mg total) by mouth daily.  . Maltodextrin-Xanthan Gum (RESOURCE THICKENUP CLEAR) POWD Take 120 g by mouth as needed. (Patient not taking: Reported on 03/13/2016)  . Multiple Vitamin (MULTIVITAMIN WITH MINERALS) TABS tablet Take 1 tablet by mouth daily.  . pantoprazole (PROTONIX) 40 MG tablet Take 40 mg by mouth daily.  . predniSONE (DELTASONE) 2.5 MG tablet Take 2.5 mg by mouth daily with breakfast.  . QUEtiapine (SEROQUEL) 25 MG tablet Take 3 tablets (75 mg total) by mouth at bedtime.  .  rosuvastatin (CRESTOR) 20 MG tablet Take 20 mg by mouth daily.  . sertraline (ZOLOFT) 100 MG tablet Take 2 tablets (200 mg total) by mouth daily.  . Skin Protectants, Misc. (DIMETHICONE-ZINC OXIDE) cream Apply topically 2 (two) times daily as needed for dry skin. Reported on 03/13/2016  . traZODone (DESYREL) 50 MG tablet Take 1 tablet (50 mg total) by mouth at bedtime.  . [DISCONTINUED] clonazePAM (KLONOPIN) 0.5 MG tablet Take 0.68m at noon and 0.581mat bedtime  . [DISCONTINUED] lamoTRIgine (LAMICTAL) 150 MG tablet Take 1 tablet (150 mg total) by mouth 2 (two) times daily.  . [DISCONTINUED] QUEtiapine (SEROQUEL) 25 MG tablet Take 3 tablets (75 mg total) by mouth at bedtime.  . [DISCONTINUED] traZODone (DESYREL) 100 MG tablet Take 1 tablet (100 mg total) by mouth at bedtime.   No facility-administered encounter medications on file as of 03/29/2016.   Recent Results (from the past 2160 hour(s))  Culture, blood (routine x 2)     Status: None   Collection Time: 03/13/16 12:30 PM  Result Value Ref Range   Specimen Description BLOOD LEFT ANTECUBITAL    Special Requests BOTTLES DRAWN AEROBIC AND ANAEROBIC 5CC    Culture  Setup Time      GRAM POSITIVE COCCI IN CHAINS IN CLUSTERS IN BOTH AEROBIC AND ANAEROBIC BOTTLES CRITICAL RESULT CALLED TO, READ BACK BY AND VERIFIED WITH: L MILLER,RN AT 0816 03/14/16 BY L BENFIELD    Culture      STAPHYLOCOCCUS SPECIES (COAGULASE NEGATIVE) VIRIDANS STREPTOCOCCUS THE SIGNIFICANCE OF ISOLATING THIS ORGANISM FROM A SINGLE SET OF BLOOD CULTURES WHEN MULTIPLE SETS ARE DRAWN IS UNCERTAIN. PLEASE NOTIFY THE MICROBIOLOGY DEPARTMENT WITHIN ONE WEEK IF SPECIATION AND SENSITIVITIES ARE REQUIRED.    Report Status 03/16/2016 FINAL   I-Stat CG4 Lactic Acid[facility]    Status: None   Collection Time: 03/13/16 12:42 PM  Result Value Ref Range   Lactic Acid, Venous 0.70 0.5 - 2.0 mmol/L  Comprehensive metabolic panel     Status: Abnormal   Collection Time: 03/13/16  12:45 PM  Result Value Ref Range   Sodium 139 135 - 145 mmol/L   Potassium 4.3 3.5 - 5.1 mmol/L   Chloride 107 101 - 111 mmol/L   CO2 22 22 - 32 mmol/L   Glucose, Bld 127 (H) 65 - 99 mg/dL   BUN 25 (H) 6 - 20 mg/dL   Creatinine, Ser 1.22 0.61 - 1.24 mg/dL   Calcium 9.0 8.9 - 10.3 mg/dL   Total Protein 6.0 (L) 6.5 - 8.1 g/dL   Albumin 3.6 3.5 - 5.0 g/dL   AST 21 15 - 41 U/L   ALT 6 (L) 17 - 63 U/L   Alkaline Phosphatase 82 38 - 126 U/L   Total Bilirubin 0.8 0.3 - 1.2 mg/dL   GFR  calc non Af Amer 55 (L) >60 mL/min   GFR calc Af Amer >60 >60 mL/min    Comment: (NOTE) The eGFR has been calculated using the CKD EPI equation. This calculation has not been validated in all clinical situations. eGFR's persistently <60 mL/min signify possible Chronic Kidney Disease.    Anion gap 10 5 - 15  CBC with Differential     Status: Abnormal   Collection Time: 03/13/16 12:45 PM  Result Value Ref Range   WBC 13.1 (H) 4.0 - 10.5 K/uL   RBC 4.20 (L) 4.22 - 5.81 MIL/uL   Hemoglobin 12.3 (L) 13.0 - 17.0 g/dL   HCT 38.4 (L) 39.0 - 52.0 %   MCV 91.4 78.0 - 100.0 fL   MCH 29.3 26.0 - 34.0 pg   MCHC 32.0 30.0 - 36.0 g/dL   RDW 14.5 11.5 - 15.5 %   Platelets 240 150 - 400 K/uL   Neutrophils Relative % 86 %   Neutro Abs 11.4 (H) 1.7 - 7.7 K/uL   Lymphocytes Relative 7 %   Lymphs Abs 0.9 0.7 - 4.0 K/uL   Monocytes Relative 5 %   Monocytes Absolute 0.7 0.1 - 1.0 K/uL   Eosinophils Relative 2 %   Eosinophils Absolute 0.2 0.0 - 0.7 K/uL   Basophils Relative 0 %   Basophils Absolute 0.0 0.0 - 0.1 K/uL  Culture, blood (routine x 2)     Status: None   Collection Time: 03/13/16  2:22 PM  Result Value Ref Range   Specimen Description BLOOD BLOOD RIGHT FOREARM    Special Requests BOTTLES DRAWN AEROBIC AND ANAEROBIC 6CC    Culture NO GROWTH 5 DAYS    Report Status 03/18/2016 FINAL   Protime-INR     Status: None   Collection Time: 03/13/16  2:22 PM  Result Value Ref Range   Prothrombin Time 14.2 11.6  - 15.2 seconds   INR 1.08 0.00 - 1.49  Urinalysis, Routine w reflex microscopic (not at [facility])     Status: Abnormal   Collection Time: 03/13/16  3:37 PM  Result Value Ref Range   Color, Urine YELLOW YELLOW   APPearance TURBID (A) CLEAR   Specific Gravity, Urine 1.015 1.005 - 1.030   pH 6.0 5.0 - 8.0   Glucose, UA NEGATIVE NEGATIVE mg/dL   Hgb urine dipstick LARGE (A) NEGATIVE   Bilirubin Urine NEGATIVE NEGATIVE   Ketones, ur 15 (A) NEGATIVE mg/dL   Protein, ur NEGATIVE NEGATIVE mg/dL   Nitrite POSITIVE (A) NEGATIVE   Leukocytes, UA LARGE (A) NEGATIVE  Urine culture     Status: None   Collection Time: 03/13/16  3:37 PM  Result Value Ref Range   Specimen Description URINE, CATHETERIZED    Special Requests NONE    Culture >=100,000 COLONIES/mL CITROBACTER YOUNGAE    Report Status 03/15/2016 FINAL    Organism ID, Bacteria CITROBACTER YOUNGAE       Susceptibility   Citrobacter youngae - MIC*    CEFAZOLIN >=64 RESISTANT Resistant     CEFTRIAXONE >=64 RESISTANT Resistant     CIPROFLOXACIN <=0.25 SENSITIVE Sensitive     GENTAMICIN <=1 SENSITIVE Sensitive     IMIPENEM 1 SENSITIVE Sensitive     NITROFURANTOIN <=16 SENSITIVE Sensitive     TRIMETH/SULFA <=20 SENSITIVE Sensitive     PIP/TAZO >=128 RESISTANT Resistant     * >=100,000 COLONIES/mL CITROBACTER YOUNGAE  Urine microscopic-add on     Status: Abnormal   Collection Time: 03/13/16  3:37 PM  Result  Value Ref Range   Squamous Epithelial / LPF NONE SEEN NONE SEEN   WBC, UA TOO NUMEROUS TO COUNT 0 - 5 WBC/hpf   RBC / HPF TOO NUMEROUS TO COUNT 0 - 5 RBC/hpf   Bacteria, UA MANY (A) NONE SEEN   Urine-Other MUCOUS PRESENT   I-Stat CG4 Lactic Acid, ED (Not at [facility])     Status: Abnormal   Collection Time: 03/13/16  3:49 PM  Result Value Ref Range   Lactic Acid, Venous 0.37 (L) 0.5 - 2.0 mmol/L  Basic metabolic panel     Status: Abnormal   Collection Time: 03/14/16  6:23 PM  Result Value Ref Range   Sodium 141 135 - 145 mmol/L    Potassium 4.3 3.5 - 5.1 mmol/L   Chloride 111 101 - 111 mmol/L   CO2 23 22 - 32 mmol/L   Glucose, Bld 139 (H) 65 - 99 mg/dL   BUN 23 (H) 6 - 20 mg/dL   Creatinine, Ser 1.13 0.61 - 1.24 mg/dL   Calcium 8.7 (L) 8.9 - 10.3 mg/dL   GFR calc non Af Amer 60 (L) >60 mL/min   GFR calc Af Amer >60 >60 mL/min    Comment: (NOTE) The eGFR has been calculated using the CKD EPI equation. This calculation has not been validated in all clinical situations. eGFR's persistently <60 mL/min signify possible Chronic Kidney Disease.    Anion gap 7 5 - 15  CBC with Differential     Status: Abnormal   Collection Time: 03/14/16  6:23 PM  Result Value Ref Range   WBC 8.1 4.0 - 10.5 K/uL   RBC 4.07 (L) 4.22 - 5.81 MIL/uL   Hemoglobin 11.6 (L) 13.0 - 17.0 g/dL   HCT 37.3 (L) 39.0 - 52.0 %   MCV 91.6 78.0 - 100.0 fL   MCH 28.5 26.0 - 34.0 pg   MCHC 31.1 30.0 - 36.0 g/dL   RDW 14.4 11.5 - 15.5 %   Platelets 257 150 - 400 K/uL   Neutrophils Relative % 80 %   Neutro Abs 6.5 1.7 - 7.7 K/uL   Lymphocytes Relative 12 %   Lymphs Abs 1.0 0.7 - 4.0 K/uL   Monocytes Relative 5 %   Monocytes Absolute 0.4 0.1 - 1.0 K/uL   Eosinophils Relative 2 %   Eosinophils Absolute 0.2 0.0 - 0.7 K/uL   Basophils Relative 1 %   Basophils Absolute 0.0 0.0 - 0.1 K/uL  I-Stat CG4 Lactic Acid, ED     Status: None   Collection Time: 03/14/16  6:33 PM  Result Value Ref Range   Lactic Acid, Venous 0.69 0.5 - 2.0 mmol/L  MRSA PCR Screening     Status: Abnormal   Collection Time: 03/14/16  9:08 PM  Result Value Ref Range   MRSA by PCR POSITIVE (A) NEGATIVE    Comment:        The GeneXpert MRSA Assay (FDA approved for NASAL specimens only), is one component of a comprehensive MRSA colonization surveillance program. It is not intended to diagnose MRSA infection nor to guide or monitor treatment for MRSA infections. RESULT CALLED TO, READ BACK BY AND VERIFIED WITH: C SISON,RN _0  03/15/16 MKELLY   Glucose, capillary      Status: None   Collection Time: 03/14/16  9:16 PM  Result Value Ref Range   Glucose-Capillary 69 65 - 99 mg/dL  Glucose, capillary     Status: Abnormal   Collection Time: 03/14/16 10:34 PM  Result Value  Ref Range   Glucose-Capillary 152 (H) 65 - 99 mg/dL  Urinalysis, Routine w reflex microscopic (not at [facility])     Status: None   Collection Time: 03/14/16 10:57 PM  Result Value Ref Range   Color, Urine YELLOW YELLOW   APPearance CLEAR CLEAR   Specific Gravity, Urine 1.017 1.005 - 1.030   pH 5.5 5.0 - 8.0   Glucose, UA NEGATIVE NEGATIVE mg/dL   Hgb urine dipstick NEGATIVE NEGATIVE   Bilirubin Urine NEGATIVE NEGATIVE   Ketones, ur NEGATIVE NEGATIVE mg/dL   Protein, ur NEGATIVE NEGATIVE mg/dL   Nitrite NEGATIVE NEGATIVE   Leukocytes, UA NEGATIVE NEGATIVE    Comment: MICROSCOPIC NOT DONE ON URINES WITH NEGATIVE PROTEIN, BLOOD, LEUKOCYTES, NITRITE, OR GLUCOSE <1000 mg/dL.  Urine culture     Status: None   Collection Time: 03/14/16 10:57 PM  Result Value Ref Range   Specimen Description URINE, RANDOM    Special Requests NONE    Culture 2,000 COLONIES/mL INSIGNIFICANT GROWTH    Report Status 03/16/2016 FINAL   Basic metabolic panel     Status: Abnormal   Collection Time: 03/15/16  6:03 AM  Result Value Ref Range   Sodium 145 135 - 145 mmol/L   Potassium 4.1 3.5 - 5.1 mmol/L   Chloride 113 (H) 101 - 111 mmol/L   CO2 23 22 - 32 mmol/L   Glucose, Bld 92 65 - 99 mg/dL   BUN 23 (H) 6 - 20 mg/dL   Creatinine, Ser 1.04 0.61 - 1.24 mg/dL   Calcium 8.6 (L) 8.9 - 10.3 mg/dL   GFR calc non Af Amer >60 >60 mL/min   GFR calc Af Amer >60 >60 mL/min    Comment: (NOTE) The eGFR has been calculated using the CKD EPI equation. This calculation has not been validated in all clinical situations. eGFR's persistently <60 mL/min signify possible Chronic Kidney Disease.    Anion gap 9 5 - 15  CBC     Status: Abnormal   Collection Time: 03/15/16  6:03 AM  Result Value Ref Range   WBC 7.8 4.0 -  10.5 K/uL   RBC 3.88 (L) 4.22 - 5.81 MIL/uL   Hemoglobin 11.2 (L) 13.0 - 17.0 g/dL   HCT 35.4 (L) 39.0 - 52.0 %   MCV 91.2 78.0 - 100.0 fL   MCH 28.9 26.0 - 34.0 pg   MCHC 31.6 30.0 - 36.0 g/dL   RDW 14.3 11.5 - 15.5 %   Platelets 230 150 - 400 K/uL  Glucose, capillary     Status: None   Collection Time: 03/15/16  7:42 AM  Result Value Ref Range   Glucose-Capillary 80 65 - 99 mg/dL   Comment 1 Notify RN   Glucose, capillary     Status: None   Collection Time: 03/15/16 11:49 AM  Result Value Ref Range   Glucose-Capillary 98 65 - 99 mg/dL   Comment 1 Notify RN   Glucose, capillary     Status: Abnormal   Collection Time: 03/15/16  5:23 PM  Result Value Ref Range   Glucose-Capillary 143 (H) 65 - 99 mg/dL   Comment 1 Notify RN   Glucose, capillary     Status: Abnormal   Collection Time: 03/15/16  8:53 PM  Result Value Ref Range   Glucose-Capillary 104 (H) 65 - 99 mg/dL  Basic metabolic panel     Status: Abnormal   Collection Time: 03/16/16  5:26 AM  Result Value Ref Range   Sodium 143 135 -  145 mmol/L   Potassium 3.9 3.5 - 5.1 mmol/L   Chloride 112 (H) 101 - 111 mmol/L   CO2 25 22 - 32 mmol/L   Glucose, Bld 93 65 - 99 mg/dL   BUN 18 6 - 20 mg/dL   Creatinine, Ser 1.17 0.61 - 1.24 mg/dL   Calcium 8.6 (L) 8.9 - 10.3 mg/dL   GFR calc non Af Amer 57 (L) >60 mL/min   GFR calc Af Amer >60 >60 mL/min    Comment: (NOTE) The eGFR has been calculated using the CKD EPI equation. This calculation has not been validated in all clinical situations. eGFR's persistently <60 mL/min signify possible Chronic Kidney Disease.    Anion gap 6 5 - 15  CBC     Status: Abnormal   Collection Time: 03/16/16  5:26 AM  Result Value Ref Range   WBC 7.2 4.0 - 10.5 K/uL   RBC 3.63 (L) 4.22 - 5.81 MIL/uL   Hemoglobin 10.5 (L) 13.0 - 17.0 g/dL   HCT 32.7 (L) 39.0 - 52.0 %   MCV 90.1 78.0 - 100.0 fL   MCH 28.9 26.0 - 34.0 pg   MCHC 32.1 30.0 - 36.0 g/dL   RDW 14.3 11.5 - 15.5 %   Platelets 210 150  - 400 K/uL  Glucose, capillary     Status: None   Collection Time: 03/16/16  7:22 AM  Result Value Ref Range   Glucose-Capillary 88 65 - 99 mg/dL  Glucose, capillary     Status: None   Collection Time: 03/16/16 12:01 PM  Result Value Ref Range   Glucose-Capillary 85 65 - 99 mg/dL    Past Psychiatric History/Hospitalization(s): Patient has significant history of bipolar disorder.  He has multiple psychiatric admission due to decompensation and noncompliance of medication.  Patient has history of aggression and impulsive behavior.  His last psychiatric admission was in 2005. Anxiety: Yes Bipolar Disorder: Yes Depression: No Mania: No Psychosis: No Schizophrenia: No Personality Disorder: No Hospitalization for psychiatric illness: Yes History of Electroconvulsive Shock Therapy: No Prior Suicide Attempts: No  Physical Exam: Constitutional:  BP 122/68 mmHg  Pulse 56  Ht _0  (1.753 m)  Wt 162 lb 9.6 oz (73.755 kg)  BMI 24.00 kg/m2  SpO2 91%  General Appearance: Patient is pleasant but confused.  He is fairly groomed.  Musculoskeletal: Strength & Muscle Tone: decreased and atrophy Gait & Station: unsteady Patient leans: Front  Mental status examination Patient is casually dressed and fairly groomed. He maintained fair eye contact.  He is pleasant and cooperative.  He described his mood euthymic and his affect is appropriate.  His gait is unsteady and he need walker to walk around.  He has difficulty remembering things and sometime he asks questions multiple times.  His speech is soft and non-spontaneous.   His thought process is slow but logical and goal directed.  He denies any active or passive suicidal thoughts or homicidal thoughts.  He denies any auditory or visual hallucination.  His attention and concentration is distracted.  There were no delusions , paranoia or obsession present at this time.  He has fine tremors and has difficulty walking.  He need walker .  His gait is  unsteady.  He's alert and oriented x3.  His insight judgment and impulse control is fair.  Established Problem, Stable/Improving (1), Review of Psycho-Social Stressors (1), Review or order clinical lab tests (1), Review and summation of old records (2), New Problem, with no additional work-up planned (3),  Review of Last Therapy Session (1), Review of Medication Regimen & Side Effects (2) and Review of New Medication or Change in Dosage (2)  Assessment:  Axis I: Bipolar disorder, cognitive disorder NOS  Plan:  I reviewed records, recent blood work results and current medication.  His Klonopin has been reduced with could be due to repeated falls .  I will decrease his trazodone further to avoid sedation, dizziness and fall.  It may cause postural hypotension.  He is already taking multiple psychiatric medication.  Recommended to take Klonopin 0.5 mg half to one tablet only for severe anxiety or panic attack.  Continue Lamictal 150 mg 2 times a day, Seroquel 75 mg at bedtime, Zoloft 200 mg daily.  Patient has no rash itching or headaches. Discussed medication side effects and benefits.  Discuss safety plan that anytime having active suicidal thoughts or homicidal thoughts then he need to call 911 or go to the local emergency room.  I will see him in 3 months.  Time spent 25 minutes.  More than 50% of the time spent in psychoeducation, counseling and coordination of care.   ARFEEN,SYED T., MD 03/29/2016

## 2016-04-03 ENCOUNTER — Telehealth: Payer: Self-pay | Admitting: Neurology

## 2016-04-03 ENCOUNTER — Emergency Department (HOSPITAL_COMMUNITY): Payer: Medicare Other

## 2016-04-03 ENCOUNTER — Encounter (HOSPITAL_COMMUNITY): Payer: Self-pay

## 2016-04-03 ENCOUNTER — Emergency Department (HOSPITAL_COMMUNITY)
Admission: EM | Admit: 2016-04-03 | Discharge: 2016-04-03 | Disposition: A | Discharge status: Home / Self Care | Payer: Medicare Other | Attending: Emergency Medicine | Admitting: Emergency Medicine

## 2016-04-03 DIAGNOSIS — Y92129 Unspecified place in nursing home as the place of occurrence of the external cause: Secondary | ICD-10-CM | POA: Diagnosis not present

## 2016-04-03 DIAGNOSIS — S51812A Laceration without foreign body of left forearm, initial encounter: Secondary | ICD-10-CM | POA: Diagnosis not present

## 2016-04-03 DIAGNOSIS — M6250 Muscle wasting and atrophy, not elsewhere classified, unspecified site: Secondary | ICD-10-CM | POA: Diagnosis not present

## 2016-04-03 DIAGNOSIS — S5011XA Contusion of right forearm, initial encounter: Secondary | ICD-10-CM | POA: Diagnosis not present

## 2016-04-03 DIAGNOSIS — Z7902 Long term (current) use of antithrombotics/antiplatelets: Secondary | ICD-10-CM | POA: Insufficient documentation

## 2016-04-03 DIAGNOSIS — Z792 Long term (current) use of antibiotics: Secondary | ICD-10-CM | POA: Diagnosis not present

## 2016-04-03 DIAGNOSIS — F319 Bipolar disorder, unspecified: Secondary | ICD-10-CM | POA: Diagnosis not present

## 2016-04-03 DIAGNOSIS — Z8614 Personal history of Methicillin resistant Staphylococcus aureus infection: Secondary | ICD-10-CM | POA: Diagnosis not present

## 2016-04-03 DIAGNOSIS — Z87440 Personal history of urinary (tract) infections: Secondary | ICD-10-CM | POA: Diagnosis not present

## 2016-04-03 DIAGNOSIS — I10 Essential (primary) hypertension: Secondary | ICD-10-CM | POA: Diagnosis not present

## 2016-04-03 DIAGNOSIS — Y998 Other external cause status: Secondary | ICD-10-CM | POA: Diagnosis not present

## 2016-04-03 DIAGNOSIS — Z7952 Long term (current) use of systemic steroids: Secondary | ICD-10-CM | POA: Insufficient documentation

## 2016-04-03 DIAGNOSIS — F419 Anxiety disorder, unspecified: Secondary | ICD-10-CM | POA: Diagnosis not present

## 2016-04-03 DIAGNOSIS — S59912A Unspecified injury of left forearm, initial encounter: Secondary | ICD-10-CM | POA: Diagnosis present

## 2016-04-03 DIAGNOSIS — Z87438 Personal history of other diseases of male genital organs: Secondary | ICD-10-CM | POA: Diagnosis not present

## 2016-04-03 DIAGNOSIS — Z7982 Long term (current) use of aspirin: Secondary | ICD-10-CM | POA: Diagnosis not present

## 2016-04-03 DIAGNOSIS — F0390 Unspecified dementia without behavioral disturbance: Secondary | ICD-10-CM | POA: Diagnosis not present

## 2016-04-03 DIAGNOSIS — S5012XA Contusion of left forearm, initial encounter: Secondary | ICD-10-CM | POA: Insufficient documentation

## 2016-04-03 DIAGNOSIS — G20 Parkinson's disease: Secondary | ICD-10-CM | POA: Insufficient documentation

## 2016-04-03 DIAGNOSIS — M1990 Unspecified osteoarthritis, unspecified site: Secondary | ICD-10-CM | POA: Insufficient documentation

## 2016-04-03 DIAGNOSIS — Y9389 Activity, other specified: Secondary | ICD-10-CM | POA: Diagnosis not present

## 2016-04-03 DIAGNOSIS — E119 Type 2 diabetes mellitus without complications: Secondary | ICD-10-CM | POA: Diagnosis not present

## 2016-04-03 DIAGNOSIS — Z8701 Personal history of pneumonia (recurrent): Secondary | ICD-10-CM | POA: Insufficient documentation

## 2016-04-03 DIAGNOSIS — W19XXXA Unspecified fall, initial encounter: Secondary | ICD-10-CM | POA: Diagnosis not present

## 2016-04-03 DIAGNOSIS — Z79899 Other long term (current) drug therapy: Secondary | ICD-10-CM | POA: Insufficient documentation

## 2016-04-03 MED ORDER — HYDRALAZINE HCL 25 MG PO TABS
25.0000 mg | ORAL_TABLET | Freq: Two times a day (BID) | ORAL | Status: DC
  Administered 2016-04-03: 25 mg via ORAL
  Filled 2016-04-03: qty 1

## 2016-04-03 NOTE — ED Notes (Signed)
Pt from [facility], experienced an unwitnessed fall. Family states SNF notified them of fall today. Pt states fall happened 2 days ago. Pt denies any head trauma/pain on palpation. Has small skin tear to L forearm. Various bruising to bilateral arms. Denies any pain. A/O x3 at baseline.

## 2016-04-03 NOTE — Telephone Encounter (Signed)
Blood work has been obtained from the primary care physician, Dr. Mellody Drown. A comprehensive metabolic profile was unremarkable with exception of a transient 1.23 and a BUN of 23, total protein level 5.1 and albumin level 3.8. White blood count of 8.0 and hemoglobin of 12.0. GFR is 55. Hematocrit of 37.2 and a platelet level of 230. Parathyroid hormone level is 70 which is slightly elevated, upper limits is 64.

## 2016-04-03 NOTE — ED Provider Notes (Signed)
CSN: [id number]     Arrival date & time 04/03/16  1526 History   First MD Initiated Contact with Patient 04/03/16 2032     Chief Complaint  Patient presents with  . Fall  . Arm Injury     (Consider location/radiation/quality/duration/timing/severity/associated sxs/prior Treatment) HPI  The patient presents from his nursing facility with the power of attorney, family member, with concern of frequent falls, new bruising, skin lesion following a fall that occurred earlier today. Per report, the fall was unwitnessed, and the patient was found by nursing home staff on the floor. The patient himself denies any ongoing complaints, though his memory loss limits his ability to assist with history of present illness. Level V caveat. Per family member, the patient has had several falls over the past few weeks, without clear precipitant. She notes that since last visiting with the patient 3 days ago, she has noticed new bruising in both temples, bruising on both arms.   Past Medical History  Diagnosis Date  . Hearing aid worn   . Hypertension   . Parkinson disease ([place])   . Pneumonia   . Diabetes mellitus type II   . Arthritis   . Bipolar disorder ([place])   . Anxiety   . Depression   . MRSA infection (methicillin-resistant Staphylococcus aureus)   . Chronic indwelling Foley catheter   . C2 cervical fracture ([name])     due to pt fall  . SIRS (systemic inflammatory response syndrome) ([name])   . Memory loss   . Benign prostate hyperplasia   . Memory difficulty 08/21/2014  . Neuromuscular disorder ([place])     parkinsons  . Flu   . Falls   . UTI (urinary tract infection)    Past Surgical History  Procedure Laterality Date  . Total hip arthroplasty    . Video bronchoscopy  12/16/2012    Procedure: VIDEO BRONCHOSCOPY;  Surgeon: Melrose Nakayama, MD;  Location: [facility];  Service: Thoracic;  Laterality: Right;  . Video assisted thoracoscopy (vats)/empyema  12/16/2012    Procedure: VIDEO  ASSISTED THORACOSCOPY (VATS)/EMPYEMA;  Surgeon: Melrose Nakayama, MD;  Location: [facility];  Service: Thoracic;  Laterality: Right;   Family History  Problem Relation Age of Onset  . Depression Father   . Cancer Mother   . Heart disease Mother    Social History  Substance Use Topics  . Smoking status: Never Smoker   . Smokeless tobacco: Never Used  . Alcohol Use: No     Comment: occasional    Review of Systems  Unable to perform ROS: Dementia      Allergies  Plant sterols and stanols  Home Medications   Prior to Admission medications   Medication Sig Start Date End Date Taking? Authorizing Provider  acetaminophen (TYLENOL) 325 MG tablet Take 650 mg by mouth every 6 (six) hours as needed for mild pain.   Yes Historical Provider, MD  amLODipine (NORVASC) 5 MG tablet Take 5 mg by mouth daily.   Yes Historical Provider, MD  aspirin 81 MG chewable tablet Chew 81 mg by mouth daily.   Yes Historical Provider, MD  bisacodyl (DULCOLAX) 10 MG suppository Place 10 mg rectally as needed for moderate constipation.   Yes Historical Provider, MD  carbidopa-levodopa (SINEMET IR) 25-250 MG per tablet Take 1 tablet by mouth 3 (three) times daily.   Yes Historical Provider, MD  clonazePAM (KLONOPIN) 0.5 MG tablet Take 0.25mg  bid as needed Patient taking differently: Take 0.25 mg by mouth 2 (  two) times daily as needed for anxiety.  03/29/16  Yes Kathlee Nations, MD  clopidogrel (PLAVIX) 75 MG tablet Take 1 tablet (75 mg total) by mouth daily. Patient taking differently: Take 75 mg by mouth every evening.  10/07/15  Yes Florencia Reasons, MD  entacapone (COMTAN) 200 MG tablet Take 200 mg by mouth 3 (three) times daily.   Yes Historical Provider, MD  feeding supplement, GLUCERNA SHAKE, (GLUCERNA SHAKE) LIQD Take 237 mLs by mouth 2 (two) times daily between meals. 03/16/16  Yes Kelvin Cellar, MD  ferrous sulfate 325 (65 FE) MG tablet Take 325 mg by mouth 2 (two) times daily.   Yes Historical Provider, MD    hydrALAZINE (APRESOLINE) 25 MG tablet Take 25 mg by mouth 2 (two) times daily.    Yes Historical Provider, MD  isosorbide mononitrate (IMDUR) 30 MG 24 hr tablet Take 1 tablet (30 mg total) by mouth daily. 10/07/15  Yes Florencia Reasons, MD  lamoTRIgine (LAMICTAL) 150 MG tablet Take 1 tablet (150 mg total) by mouth 2 (two) times daily. 03/29/16  Yes Kathlee Nations, MD  lisinopril (PRINIVIL,ZESTRIL) 10 MG tablet Take 0.5 tablets (5 mg total) by mouth daily. 10/07/15  Yes Florencia Reasons, MD  loratadine (CLARITIN) 10 MG tablet Take 10 mg by mouth daily as needed for allergies.   Yes Historical Provider, MD  Maltodextrin-Xanthan Gum (RESOURCE THICKENUP CLEAR) POWD Take 120 g by mouth as needed. Patient taking differently: Take 1 Can by mouth as needed (takes 120mg  as needed).  10/07/15  Yes Florencia Reasons, MD  Multiple Vitamin (MULTIVITAMIN WITH MINERALS) TABS tablet Take 1 tablet by mouth daily.   Yes Historical Provider, MD  pantoprazole (PROTONIX) 40 MG tablet Take 40 mg by mouth daily.   Yes Historical Provider, MD  predniSONE (DELTASONE) 2.5 MG tablet Take 2.5 mg by mouth daily with breakfast.   Yes Historical Provider, MD  QUEtiapine (SEROQUEL) 25 MG tablet Take 3 tablets (75 mg total) by mouth at bedtime. 03/29/16  Yes Kathlee Nations, MD  rosuvastatin (CRESTOR) 20 MG tablet Take 20 mg by mouth daily.   Yes Historical Provider, MD  sertraline (ZOLOFT) 100 MG tablet Take 2 tablets (200 mg total) by mouth daily. 10/01/14  Yes Kathlee Nations, MD  Skin Protectants, Misc. (BAZA PROTECT EX) Apply 1 application topically 3 (three) times daily as needed (for dermatitis).   Yes Historical Provider, MD  traZODone (DESYREL) 50 MG tablet Take 1 tablet (50 mg total) by mouth at bedtime. 03/29/16  Yes Kathlee Nations, MD  albuterol (PROVENTIL) (2.5 MG/3ML) 0.083% nebulizer solution Take 3 mLs (2.5 mg total) by nebulization every 2 (two) hours as needed for wheezing or shortness of breath. 04/21/14   Hosie Poisson, MD  ciprofloxacin (CIPRO) 500  MG tablet Take 1 tablet (500 mg total) by mouth 2 (two) times daily. 03/16/16   Kelvin Cellar, MD   BP 211/89 mmHg  Pulse 54  Temp(Src) 97.4 F (36.3 C) (Oral)  Resp 20  SpO2 100% Physical Exam  Constitutional: No distress.  Thin, chronically ill appearing  HENT:  Head:    Right Ear: External ear normal.  Left Ear: External ear normal.  Nose: Nose normal.  Mouth/Throat: Oropharynx is clear and moist.  Eyes: Conjunctivae and EOM are normal. Pupils are equal, round, and reactive to light.  Neck: Normal range of motion. Neck supple.  Cardiovascular: Normal rate, regular rhythm, normal heart sounds and intact distal pulses.   Pulmonary/Chest: Effort normal and breath sounds  normal. No respiratory distress. He has no wheezes.  Abdominal: Soft. Bowel sounds are normal. He exhibits no distension. There is no tenderness.  Musculoskeletal: He exhibits no edema.  Neurological: He is alert. He displays atrophy. He displays no tremor. No cranial nerve deficit. He displays no seizure activity.  Skin: Skin is warm and dry. Abrasion noted. He is not diaphoretic.     Psychiatric: He has a normal mood and affect. He is withdrawn. Cognition and memory are impaired.  Nursing note and vitals reviewed.   ED Course  Procedures (including critical care time) Labs Review Labs Reviewed - No data to display  Imaging Review Ct Head Wo Contrast  04/03/2016  CLINICAL DATA:  Witnessed fall. Patient is on Plavix. History of dementia. EXAM: CT HEAD WITHOUT CONTRAST TECHNIQUE: Contiguous axial images were obtained from the base of the skull through the vertex without intravenous contrast. COMPARISON:  10/05/2015 FINDINGS: Mild diffuse cerebral atrophy. Ventricular dilatation consistent with central atrophy. Patchy low-attenuation changes in the deep white matter are nonspecific but most consistent with small vessel ischemic change. No mass effect or midline shift. Prominent CSF space in the posterior fossa,  likely an arachnoid cyst. No change since prior study. No other abnormal extra-axial fluid collections. Gray-white matter junctions are distinct. Basal cisterns are not effaced. No acute intracranial hemorrhage. Vascular calcifications. Calvarium appears intact. Mucosal thickening in the paranasal sinuses. Minimal aeration of the mastoid air cells bilaterally. Suggestion of an effusion in the left middle ear. IMPRESSION: No acute intracranial abnormalities. Chronic atrophy and small vessel ischemic changes. Probable arachnoid cyst in the posterior fossa, unchanged. Suggestion of left middle ear effusion. Electronically Signed   By: Lucienne Capers M.D.   On: 04/03/2016 21:25   I have personally reviewed and evaluated these images and lab results as part of my medical decision-making.  On repeat exam patient is in no distress. Patient is hypertensive, though he has not taken his home medication yet.  MDM  She presents from his nursing facility after family members found him to have new cutaneous lesions following a series of falls. Here, the patient is awake and alert. He is hemodynamically stable, though he becomes hypertensive after not taking his neck medication Otherwise, no distress, no evidence for hemodynamic instability, decompensation. Studies largely reassuring. Patient discharged in stable condition back to his nursing facility.   Carmin Muskrat, MD 04/03/16 2212

## 2016-04-03 NOTE — ED Notes (Signed)
Pt taken to and from restroom with this RN via wheelchair.

## 2016-04-03 NOTE — Discharge Instructions (Signed)
As discussed, your evaluation today has been largely reassuring.  But, it is important that you monitor your condition carefully, and do not hesitate to return to the ED if you develop new, or concerning changes in your condition. ? ?Otherwise, please follow-up with your physician for appropriate ongoing care. ? ?

## 2016-04-03 NOTE — ED Notes (Signed)
Patient transported to CT 

## 2016-04-11 ENCOUNTER — Telehealth (HOSPITAL_COMMUNITY): Payer: Self-pay

## 2016-04-11 DIAGNOSIS — F319 Bipolar disorder, unspecified: Secondary | ICD-10-CM

## 2016-04-11 MED ORDER — TRAZODONE HCL 50 MG PO TABS: 100.0000 mg | ORAL_TABLET | Freq: Every day | ORAL | Status: DC

## 2016-04-11 NOTE — Telephone Encounter (Signed)
Okay, called [facility] to let them know that the order had changed.

## 2016-04-11 NOTE — Telephone Encounter (Signed)
Go back to trazodone 100 mg

## 2016-04-11 NOTE — Telephone Encounter (Signed)
[place] from [facility] called to say that since patients trazodone was decreased, he is not sleeping at night. They would like to increase it back up to 100 mg - please review and advise, thank you

## 2016-04-12 ENCOUNTER — Other Ambulatory Visit (HOSPITAL_COMMUNITY): Payer: Self-pay

## 2016-04-12 DIAGNOSIS — F3162 Bipolar disorder, current episode mixed, moderate: Secondary | ICD-10-CM

## 2016-04-12 MED ORDER — TRAZODONE HCL 100 MG PO TABS: 100.0000 mg | ORAL_TABLET | Freq: Every day | ORAL | Status: DC

## 2016-04-12 NOTE — Telephone Encounter (Signed)
Reprinted the order for Trazodone per Dr. Adele Schilder to change the dosage, the new prescription was faxed to [facility]

## 2016-05-15 ENCOUNTER — Encounter (HOSPITAL_COMMUNITY): Payer: Self-pay

## 2016-05-15 ENCOUNTER — Emergency Department (HOSPITAL_COMMUNITY)
Admission: EM | Admit: 2016-05-15 | Discharge: 2016-05-15 | Disposition: A | Discharge status: Home / Self Care | Payer: Medicare Other | Attending: Emergency Medicine | Admitting: Emergency Medicine

## 2016-05-15 ENCOUNTER — Emergency Department (HOSPITAL_COMMUNITY)
Admission: EM | Admit: 2016-05-15 | Discharge: 2016-05-16 | Disposition: A | Discharge status: Home / Self Care | Payer: Medicare Other | Source: Home / Self Care | Attending: Emergency Medicine | Admitting: Emergency Medicine

## 2016-05-15 ENCOUNTER — Emergency Department (HOSPITAL_COMMUNITY): Payer: Medicare Other

## 2016-05-15 DIAGNOSIS — S01112A Laceration without foreign body of left eyelid and periocular area, initial encounter: Secondary | ICD-10-CM | POA: Diagnosis not present

## 2016-05-15 DIAGNOSIS — Y92129 Unspecified place in nursing home as the place of occurrence of the external cause: Secondary | ICD-10-CM | POA: Insufficient documentation

## 2016-05-15 DIAGNOSIS — R531 Weakness: Secondary | ICD-10-CM | POA: Insufficient documentation

## 2016-05-15 DIAGNOSIS — Y998 Other external cause status: Secondary | ICD-10-CM | POA: Diagnosis not present

## 2016-05-15 DIAGNOSIS — Z7901 Long term (current) use of anticoagulants: Secondary | ICD-10-CM | POA: Insufficient documentation

## 2016-05-15 DIAGNOSIS — W19XXXA Unspecified fall, initial encounter: Secondary | ICD-10-CM | POA: Insufficient documentation

## 2016-05-15 DIAGNOSIS — Z974 Presence of external hearing-aid: Secondary | ICD-10-CM | POA: Diagnosis not present

## 2016-05-15 DIAGNOSIS — Z8614 Personal history of Methicillin resistant Staphylococcus aureus infection: Secondary | ICD-10-CM | POA: Insufficient documentation

## 2016-05-15 DIAGNOSIS — Z96649 Presence of unspecified artificial hip joint: Secondary | ICD-10-CM | POA: Insufficient documentation

## 2016-05-15 DIAGNOSIS — E119 Type 2 diabetes mellitus without complications: Secondary | ICD-10-CM | POA: Insufficient documentation

## 2016-05-15 DIAGNOSIS — S0181XA Laceration without foreign body of other part of head, initial encounter: Secondary | ICD-10-CM

## 2016-05-15 DIAGNOSIS — Z8709 Personal history of other diseases of the respiratory system: Secondary | ICD-10-CM | POA: Diagnosis not present

## 2016-05-15 DIAGNOSIS — S3730XA Unspecified injury of urethra, initial encounter: Secondary | ICD-10-CM | POA: Insufficient documentation

## 2016-05-15 DIAGNOSIS — M1990 Unspecified osteoarthritis, unspecified site: Secondary | ICD-10-CM

## 2016-05-15 DIAGNOSIS — IMO0002 Reserved for concepts with insufficient information to code with codable children: Secondary | ICD-10-CM

## 2016-05-15 DIAGNOSIS — F419 Anxiety disorder, unspecified: Secondary | ICD-10-CM | POA: Insufficient documentation

## 2016-05-15 DIAGNOSIS — Z7952 Long term (current) use of systemic steroids: Secondary | ICD-10-CM | POA: Insufficient documentation

## 2016-05-15 DIAGNOSIS — Y9289 Other specified places as the place of occurrence of the external cause: Secondary | ICD-10-CM | POA: Diagnosis not present

## 2016-05-15 DIAGNOSIS — W01198A Fall on same level from slipping, tripping and stumbling with subsequent striking against other object, initial encounter: Secondary | ICD-10-CM | POA: Insufficient documentation

## 2016-05-15 DIAGNOSIS — Z87440 Personal history of urinary (tract) infections: Secondary | ICD-10-CM | POA: Diagnosis not present

## 2016-05-15 DIAGNOSIS — Y999 Unspecified external cause status: Secondary | ICD-10-CM | POA: Insufficient documentation

## 2016-05-15 DIAGNOSIS — Z8781 Personal history of (healed) traumatic fracture: Secondary | ICD-10-CM | POA: Insufficient documentation

## 2016-05-15 DIAGNOSIS — Z87438 Personal history of other diseases of male genital organs: Secondary | ICD-10-CM | POA: Insufficient documentation

## 2016-05-15 DIAGNOSIS — F319 Bipolar disorder, unspecified: Secondary | ICD-10-CM

## 2016-05-15 DIAGNOSIS — Y939 Activity, unspecified: Secondary | ICD-10-CM

## 2016-05-15 DIAGNOSIS — Z79899 Other long term (current) drug therapy: Secondary | ICD-10-CM | POA: Insufficient documentation

## 2016-05-15 DIAGNOSIS — Z792 Long term (current) use of antibiotics: Secondary | ICD-10-CM | POA: Insufficient documentation

## 2016-05-15 DIAGNOSIS — Z7902 Long term (current) use of antithrombotics/antiplatelets: Secondary | ICD-10-CM | POA: Diagnosis not present

## 2016-05-15 DIAGNOSIS — Y9389 Activity, other specified: Secondary | ICD-10-CM | POA: Insufficient documentation

## 2016-05-15 DIAGNOSIS — I10 Essential (primary) hypertension: Secondary | ICD-10-CM | POA: Insufficient documentation

## 2016-05-15 DIAGNOSIS — Z7982 Long term (current) use of aspirin: Secondary | ICD-10-CM

## 2016-05-15 DIAGNOSIS — G20 Parkinson's disease: Secondary | ICD-10-CM | POA: Insufficient documentation

## 2016-05-15 DIAGNOSIS — Z8701 Personal history of pneumonia (recurrent): Secondary | ICD-10-CM | POA: Diagnosis not present

## 2016-05-15 DIAGNOSIS — S0990XA Unspecified injury of head, initial encounter: Secondary | ICD-10-CM | POA: Diagnosis present

## 2016-05-15 DIAGNOSIS — R310 Gross hematuria: Secondary | ICD-10-CM

## 2016-05-15 MED ORDER — ACETAMINOPHEN 500 MG PO TABS
1000.0000 mg | ORAL_TABLET | Freq: Once | ORAL | Status: AC
Start: 2016-05-15 — End: 2016-05-15
  Administered 2016-05-15: 1000 mg via ORAL
  Filled 2016-05-15: qty 2

## 2016-05-15 MED ORDER — LIDOCAINE-EPINEPHRINE-TETRACAINE (LET) SOLUTION
3.0000 mL | Freq: Once | NASAL | Status: AC
  Administered 2016-05-15: 3 mL via TOPICAL
  Filled 2016-05-15: qty 3

## 2016-05-15 NOTE — ED Provider Notes (Signed)
CSN: [id number]     Arrival date & time 05/15/16  2255 History  By signing my name below, I, [facility], attest that this documentation has been prepared under the direction and in the presence of Delora Fuel, MD. Electronically Signed: Virgel Bouquet, ED Scribe. 05/15/2016. 11:47 PM.   Chief Complaint  Patient presents with  . Hematuria    dislodement of foley catheter    The history is provided by the patient and medical records. No language interpreter was used.  HPI Comments: [name] is a 81 y.o. male brought in by EMS with an hx of HTN, Parkinson disease, DM, bipolar disorder, memory loss, and falls who presents to the Emergency Department with a hematuria from a dislodged foley catheter that may have become out of place after a fall earlier today Per nursing note, he was brought in from [facility] by EMS due to dislodging his foley catheter. Pt states that he fell earlier this morning and was evaluated here at [facility] by Dr. April Palumbo and discharged home. He notes that he fell again earlier this afternoon. He reports constant, unchanging, moderate weakness but is unsure if this is related to his frequent falls. Per pt, he believes that his foley catheter may have come out during the fall this afternoon. Denies any pain currently or any other symptoms.   Past Medical History  Diagnosis Date  . Hearing aid worn   . Hypertension   . Parkinson disease ([place])   . Pneumonia   . Diabetes mellitus type II   . Arthritis   . Bipolar disorder ([place])   . Anxiety   . Depression   . MRSA infection (methicillin-resistant Staphylococcus aureus)   . Chronic indwelling Foley catheter   . C2 cervical fracture ([name])     due to pt fall  . SIRS (systemic inflammatory response syndrome) ([name])   . Memory loss   . Benign prostate hyperplasia   . Memory difficulty 08/21/2014  . Neuromuscular disorder ([place])     parkinsons  . Flu   . Falls   . UTI (urinary tract  infection)    Past Surgical History  Procedure Laterality Date  . Total hip arthroplasty    . Video bronchoscopy  12/16/2012    Procedure: VIDEO BRONCHOSCOPY;  Surgeon: Melrose Nakayama, MD;  Location: [facility];  Service: Thoracic;  Laterality: Right;  . Video assisted thoracoscopy (vats)/empyema  12/16/2012    Procedure: VIDEO ASSISTED THORACOSCOPY (VATS)/EMPYEMA;  Surgeon: Melrose Nakayama, MD;  Location: [facility];  Service: Thoracic;  Laterality: Right;   Family History  Problem Relation Age of Onset  . Depression Father   . Cancer Mother   . Heart disease Mother    Social History  Substance Use Topics  . Smoking status: Never Smoker   . Smokeless tobacco: Never Used  . Alcohol Use: No     Comment: occasional    Review of Systems  Musculoskeletal: Negative for myalgias.  Neurological: Positive for weakness.  All other systems reviewed and are negative.    Allergies  Plant sterols and stanols  Home Medications   Prior to Admission medications   Medication Sig Start Date End Date Taking? Authorizing Provider  acetaminophen (TYLENOL) 325 MG tablet Take 650 mg by mouth every 6 (six) hours as needed for mild pain.    Historical Provider, MD  albuterol (PROVENTIL) (2.5 MG/3ML) 0.083% nebulizer solution Take 3 mLs (2.5 mg total) by nebulization every 2 (two) hours as needed  for wheezing or shortness of breath. 04/21/14   Hosie Poisson, MD  amLODipine (NORVASC) 5 MG tablet Take 5 mg by mouth daily.    Historical Provider, MD  aspirin 81 MG chewable tablet Chew 81 mg by mouth daily.    Historical Provider, MD  bisacodyl (DULCOLAX) 10 MG suppository Place 10 mg rectally as needed for moderate constipation.    Historical Provider, MD  carbidopa-levodopa (SINEMET IR) 25-250 MG per tablet Take 1 tablet by mouth 3 (three) times daily.    Historical Provider, MD  clonazePAM (KLONOPIN) 0.5 MG tablet Take 0.25mg  bid as needed Patient taking differently: Take 0.25 mg by mouth 2 (two)  times daily as needed for anxiety.  03/29/16   Kathlee Nations, MD  clopidogrel (PLAVIX) 75 MG tablet Take 1 tablet (75 mg total) by mouth daily. Patient taking differently: Take 75 mg by mouth every evening.  10/07/15   Florencia Reasons, MD  entacapone (COMTAN) 200 MG tablet Take 200 mg by mouth 3 (three) times daily.    Historical Provider, MD  feeding supplement, GLUCERNA SHAKE, (GLUCERNA SHAKE) LIQD Take 237 mLs by mouth 2 (two) times daily between meals. 03/16/16   Kelvin Cellar, MD  ferrous sulfate 325 (65 FE) MG tablet Take 325 mg by mouth 2 (two) times daily.    Historical Provider, MD  guaifenesin (ROBITUSSIN) 100 MG/5ML syrup Take 200 mg by mouth 3 (three) times daily as needed for cough.    Historical Provider, MD  hydrALAZINE (APRESOLINE) 25 MG tablet Take 25 mg by mouth 2 (two) times daily.     Historical Provider, MD  isosorbide mononitrate (IMDUR) 30 MG 24 hr tablet Take 1 tablet (30 mg total) by mouth daily. 10/07/15   Florencia Reasons, MD  lamoTRIgine (LAMICTAL) 150 MG tablet Take 1 tablet (150 mg total) by mouth 2 (two) times daily. 03/29/16   Kathlee Nations, MD  lisinopril (PRINIVIL,ZESTRIL) 10 MG tablet Take 0.5 tablets (5 mg total) by mouth daily. 10/07/15   Florencia Reasons, MD  loratadine (CLARITIN) 10 MG tablet Take 10 mg by mouth daily as needed for allergies.    Historical Provider, MD  Maltodextrin-Xanthan Gum (RESOURCE THICKENUP CLEAR) POWD Take 120 g by mouth as needed. Patient taking differently: Take 1 Can by mouth daily as needed (takes 120mg  as needed food thickener).  10/07/15   Florencia Reasons, MD  Multiple Vitamin (MULTIVITAMIN WITH MINERALS) TABS tablet Take 1 tablet by mouth daily.    Historical Provider, MD  pantoprazole (PROTONIX) 40 MG tablet Take 40 mg by mouth daily.    Historical Provider, MD  polyethylene glycol (MIRALAX / GLYCOLAX) packet Take 17 g by mouth daily.    Historical Provider, MD  predniSONE (DELTASONE) 2.5 MG tablet Take 2.5 mg by mouth daily with breakfast.    Historical  Provider, MD  QUEtiapine (SEROQUEL) 25 MG tablet Take 3 tablets (75 mg total) by mouth at bedtime. 03/29/16   Kathlee Nations, MD  rosuvastatin (CRESTOR) 20 MG tablet Take 20 mg by mouth daily.    Historical Provider, MD  sertraline (ZOLOFT) 100 MG tablet Take 2 tablets (200 mg total) by mouth daily. 10/01/14   Kathlee Nations, MD  Skin Protectants, Misc. (BAZA PROTECT EX) Apply 1 application topically 2 (two) times daily.     Historical Provider, MD  traZODone (DESYREL) 100 MG tablet Take 1 tablet (100 mg total) by mouth at bedtime. 04/12/16   Kathlee Nations, MD   BP 104/60 mmHg  Pulse 89  Temp(Src) 98 F (36.7 C) (Oral)  Resp 16  SpO2 97% Physical Exam  Constitutional: He is oriented to person, place, and time. He appears well-developed and well-nourished. No distress.  HENT:  Head: Normocephalic and atraumatic.  Forehead laceration which had been repaired earlier today.  Eyes: Conjunctivae and EOM are normal. Pupils are equal, round, and reactive to light.  Neck: Normal range of motion. Neck supple. No JVD present.  Cardiovascular: Normal rate, regular rhythm and normal heart sounds.   No murmur heard. Pulmonary/Chest: Effort normal and breath sounds normal. He has no wheezes. He has no rales. He exhibits no tenderness.  Abdominal: Soft. Bowel sounds are normal. He exhibits no distension and no mass. There is no tenderness.  Genitourinary:  Circumcised penis with three-way foley catheter in place. Small amount of blood at the urethral aetheris.  Musculoskeletal: Normal range of motion. He exhibits no edema.  Lymphadenopathy:    He has no cervical adenopathy.  Neurological: He is alert and oriented to person, place, and time. No cranial nerve deficit. He exhibits abnormal muscle tone.  Mild generalized weakness. Strength 4/5.  Skin: Skin is warm and dry. No rash noted.  Psychiatric: He has a normal mood and affect. His behavior is normal. Judgment and thought content normal.  Nursing note  and vitals reviewed.   ED Course  Procedures (including critical care time)  DIAGNOSTIC STUDIES: Oxygen Saturation is 97% on RA, normal by my interpretation.    COORDINATION OF CARE: 11:02 PM Will flush catheter. Discussed treatment plan with pt at bedside and pt agreed to plan.  11:47 PM Spoke with pt's nurse who had flushed the pt's catheter and noted that there was bright red blood present but no clots.   MDM   Final diagnoses:  Fall at nursing home, initial encounter  Urethral injury, closed, initial encounter  Gross hematuria  Generalized weakness    Patient with repeated falls. There appears to been some mild urethral trauma related to his Foley catheter. I see no evidence of other new injury. Old records are reviewed showing multiple ED visits for falls including one earlier today with resulting of forehead laceration which was repaired. No indication for repeat imaging today. The Foley catheter is in place and appropriate tube would be to maintain it in place so that any urethral injury would not result in a stricture. Catheter should not be pulled out since placing a catheter would risk creating a false passage. Plantars irrigated and there is dark red blood in the bladder. Additional bladder irrigation was done and he is discharged to go back to his nursing home.  I personally performed the services described in this documentation, which was scribed in my presence. The recorded information has been reviewed and is accurate.     Delora Fuel, MD [id number]

## 2016-05-15 NOTE — ED Notes (Signed)
Pt repeatedly asking for foley to be changed; primary RN aware

## 2016-05-15 NOTE — Discharge Instructions (Signed)
Continue foley catheter care.

## 2016-05-15 NOTE — ED Notes (Signed)
Notified [facility]  For transportation back home

## 2016-05-15 NOTE — ED Notes (Addendum)
Pt brought in by EMS from [facility] due to dislodging foley catheter.Small amount of blood noted around catheter site per EMS.  BP-122/78,P- 88, R 14 . Pt alert and oriented per EMS.

## 2016-05-15 NOTE — ED Notes (Signed)
Per EMS: [facility]. Tripped over shoe laces. Struck head. 3 inch lac above left eyebrow. Small abrasions to L arm and left knee. Small lac on nose, under bandaid. Pt on plavix.

## 2016-05-15 NOTE — ED Notes (Signed)
EDP aware blood in foley catheter. Catheter still flushing

## 2016-05-15 NOTE — ED Notes (Signed)
Pt bladder scanned and shows >500cc in bladder with foley catheter in place. Bloody urine noted in tubing but none draining into bag. 120cc of normal saline used to flush catheter and dark bloody urine returned. No clots noted at this time. Catheter continues to drain dark bloody urine.

## 2016-05-15 NOTE — ED Provider Notes (Signed)
CSN: [id number]     Arrival date & time 05/15/16  0218 History   By signing my name below, I, [name], attest that this documentation has been prepared under the direction and in the presence of Osamu Olguin, MD.  Electronically Signed: Tedra Coupe. Sheppard Coil, ED Scribe. 05/15/2016. 2:28 AM.  Chief Complaint  Patient presents with  . Fall  . Head Injury    Patient is a 81 y.o. male presenting with fall and head injury. The history is provided by the patient. No language interpreter was used.  Fall This is a new problem. The current episode started less than 1 hour ago. The problem has not changed since onset.Pertinent negatives include no chest pain, no abdominal pain, no headaches and no shortness of breath. Nothing aggravates the symptoms. Nothing relieves the symptoms.  Head Injury Associated symptoms: no headaches, no nausea and no vomiting     HPI Comments: [name] is a 81 y.o. male with PMHx of Parkinson's, Neuromuscular Disease, Bipolar disorder and Memory loss who presents to the Emergency Department via EMS s/p fall that occurred PTA. Pt reports he attempted to turn off tv when he fell and hit his head on floor. Unknown LOC. Per EMS, pt has a 3in laceration on forehead above left eyebrow. EMS reports that they changed dressing on head 3x PTA. Pt is on Plavix.  Past Medical History  Diagnosis Date  . Hearing aid worn   . Hypertension   . Parkinson disease ([place])   . Pneumonia   . Diabetes mellitus type II   . Arthritis   . Bipolar disorder ([place])   . Anxiety   . Depression   . MRSA infection (methicillin-resistant Staphylococcus aureus)   . Chronic indwelling Foley catheter   . C2 cervical fracture ([name])     due to pt fall  . SIRS (systemic inflammatory response syndrome) ([name])   . Memory loss   . Benign prostate hyperplasia   . Memory difficulty 08/21/2014  . Neuromuscular disorder ([place])     parkinsons  . Flu   . Falls   . UTI (urinary tract infection)     Past Surgical History  Procedure Laterality Date  . Total hip arthroplasty    . Video bronchoscopy  12/16/2012    Procedure: VIDEO BRONCHOSCOPY;  Surgeon: Melrose Nakayama, MD;  Location: [facility];  Service: Thoracic;  Laterality: Right;  . Video assisted thoracoscopy (vats)/empyema  12/16/2012    Procedure: VIDEO ASSISTED THORACOSCOPY (VATS)/EMPYEMA;  Surgeon: Melrose Nakayama, MD;  Location: [facility];  Service: Thoracic;  Laterality: Right;   Family History  Problem Relation Age of Onset  . Depression Father   . Cancer Mother   . Heart disease Mother    Social History  Substance Use Topics  . Smoking status: Never Smoker   . Smokeless tobacco: Never Used  . Alcohol Use: No     Comment: occasional    Review of Systems  Constitutional: Negative for fever.  Respiratory: Negative for shortness of breath.   Cardiovascular: Negative for chest pain.  Gastrointestinal: Negative for nausea, vomiting and abdominal pain.  Skin: Positive for wound (Laceration above left eyebrow).  Neurological: Negative for headaches.  All other systems reviewed and are negative.     Allergies  Plant sterols and stanols  Home Medications   Prior to Admission medications   Medication Sig Start Date End Date Taking? Authorizing Provider  acetaminophen (TYLENOL) 325 MG tablet Take 650 mg by mouth every  6 (six) hours as needed for mild pain.    Historical Provider, MD  albuterol (PROVENTIL) (2.5 MG/3ML) 0.083% nebulizer solution Take 3 mLs (2.5 mg total) by nebulization every 2 (two) hours as needed for wheezing or shortness of breath. 04/21/14   Hosie Poisson, MD  amLODipine (NORVASC) 5 MG tablet Take 5 mg by mouth daily.    Historical Provider, MD  aspirin 81 MG chewable tablet Chew 81 mg by mouth daily.    Historical Provider, MD  bisacodyl (DULCOLAX) 10 MG suppository Place 10 mg rectally as needed for moderate constipation.    Historical Provider, MD  carbidopa-levodopa (SINEMET IR) 25-250 MG  per tablet Take 1 tablet by mouth 3 (three) times daily.    Historical Provider, MD  ciprofloxacin (CIPRO) 500 MG tablet Take 1 tablet (500 mg total) by mouth 2 (two) times daily. 03/16/16   Kelvin Cellar, MD  clonazePAM (KLONOPIN) 0.5 MG tablet Take 0.25mg  bid as needed Patient taking differently: Take 0.25 mg by mouth 2 (two) times daily as needed for anxiety.  03/29/16   Kathlee Nations, MD  clopidogrel (PLAVIX) 75 MG tablet Take 1 tablet (75 mg total) by mouth daily. Patient taking differently: Take 75 mg by mouth every evening.  10/07/15   Florencia Reasons, MD  entacapone (COMTAN) 200 MG tablet Take 200 mg by mouth 3 (three) times daily.    Historical Provider, MD  feeding supplement, GLUCERNA SHAKE, (GLUCERNA SHAKE) LIQD Take 237 mLs by mouth 2 (two) times daily between meals. 03/16/16   Kelvin Cellar, MD  ferrous sulfate 325 (65 FE) MG tablet Take 325 mg by mouth 2 (two) times daily.    Historical Provider, MD  hydrALAZINE (APRESOLINE) 25 MG tablet Take 25 mg by mouth 2 (two) times daily.     Historical Provider, MD  isosorbide mononitrate (IMDUR) 30 MG 24 hr tablet Take 1 tablet (30 mg total) by mouth daily. 10/07/15   Florencia Reasons, MD  lamoTRIgine (LAMICTAL) 150 MG tablet Take 1 tablet (150 mg total) by mouth 2 (two) times daily. 03/29/16   Kathlee Nations, MD  lisinopril (PRINIVIL,ZESTRIL) 10 MG tablet Take 0.5 tablets (5 mg total) by mouth daily. 10/07/15   Florencia Reasons, MD  loratadine (CLARITIN) 10 MG tablet Take 10 mg by mouth daily as needed for allergies.    Historical Provider, MD  Maltodextrin-Xanthan Gum (RESOURCE THICKENUP CLEAR) POWD Take 120 g by mouth as needed. Patient taking differently: Take 1 Can by mouth as needed (takes 120mg  as needed).  10/07/15   Florencia Reasons, MD  Multiple Vitamin (MULTIVITAMIN WITH MINERALS) TABS tablet Take 1 tablet by mouth daily.    Historical Provider, MD  pantoprazole (PROTONIX) 40 MG tablet Take 40 mg by mouth daily.    Historical Provider, MD  predniSONE (DELTASONE) 2.5  MG tablet Take 2.5 mg by mouth daily with breakfast.    Historical Provider, MD  QUEtiapine (SEROQUEL) 25 MG tablet Take 3 tablets (75 mg total) by mouth at bedtime. 03/29/16   Kathlee Nations, MD  rosuvastatin (CRESTOR) 20 MG tablet Take 20 mg by mouth daily.    Historical Provider, MD  sertraline (ZOLOFT) 100 MG tablet Take 2 tablets (200 mg total) by mouth daily. 10/01/14   Kathlee Nations, MD  Skin Protectants, Misc. (BAZA PROTECT EX) Apply 1 application topically 3 (three) times daily as needed (for dermatitis).    Historical Provider, MD  traZODone (DESYREL) 100 MG tablet Take 1 tablet (100 mg total) by mouth at  bedtime. 04/12/16   Kathlee Nations, MD   BP 129/84 mmHg  Pulse 75  Temp(Src) 98 F (36.7 C) (Oral)  Resp 18  SpO2 100% Physical Exam  Constitutional: He is oriented to person, place, and time. He appears well-developed and well-nourished.  HENT:  Head: Normocephalic and atraumatic.  Right Ear: No hemotympanum.  Left Ear: No hemotympanum.  1.25 cm vertical superficial laceration of the forehead  Eyes: EOM are normal. Pupils are equal, round, and reactive to light.  No Raccoon Eyes or Battle Signs  Neck: Normal range of motion.  Cardiovascular: Normal rate, regular rhythm, normal heart sounds and intact distal pulses.   Intact dorsalis pedis pulses bilaterally.  Pulmonary/Chest: Effort normal and breath sounds normal. No respiratory distress. He has no wheezes. He has no rales.  Abdominal: Soft. Bowel sounds are normal. He exhibits no distension. There is no tenderness. There is no rebound and no guarding.  Musculoskeletal: Normal range of motion.  Pelvis stable.  Neurological: He is alert and oriented to person, place, and time. He has normal reflexes.  Skin: Skin is warm and dry. No rash noted.  Medial vertical laceration to left eyebrow.  Psychiatric: He has a normal mood and affect. Judgment normal.  Nursing note and vitals reviewed.   ED Course  Procedures (including  critical care time) DIAGNOSTIC STUDIES: Oxygen Saturation is 100% on RA, normal by my interpretation.    COORDINATION OF CARE: 2:28 AM-Discussed treatment plan which includes laceration repair and Head CT with pt at bedside and pt agreed to plan. Will order Tylenol.  Labs Review Labs Reviewed - No data to display  Imaging Review No results found. I have personally reviewed and evaluated these images and lab results as part of my medical decision-making.   EKG Interpretation None     LACERATION REPAIR Performed by: Robben Jagiello, MD Consent: Verbal consent obtained. Risks and benefits: risks, benefits and alternatives were discussed Patient identity confirmed: provided demographic data Time out performed prior to procedure Prepped and Draped in normal sterile fashion Wound explored Laceration Location: foreheard Laceration Length: 1.26 cm No Foreign Bodies seen or palpated LET on wound Irrigation method: syringe Amount of cleaning: standard Skin closure: dermabond  Patient tolerance: Patient tolerated the procedure well with no immediate complications.   MDM  3:33 AM Spoke with Dr. Loleta Books, Hospitalist, agrees to admit to Obs Tele.  Final diagnoses:  None   Filed Vitals:   05/15/16 0236  BP: 129/84  Pulse: 75  Temp: 98 F (36.7 C)  Resp: 18    Results for orders placed or performed during the hospital encounter of 03/14/16  Urine culture  Result Value Ref Range   Specimen Description URINE, RANDOM    Special Requests NONE    Culture 2,000 COLONIES/mL INSIGNIFICANT GROWTH    Report Status 03/16/2016 FINAL   MRSA PCR Screening  Result Value Ref Range   MRSA by PCR POSITIVE (A) NEGATIVE  Basic metabolic panel  Result Value Ref Range   Sodium 141 135 - 145 mmol/L   Potassium 4.3 3.5 - 5.1 mmol/L   Chloride 111 101 - 111 mmol/L   CO2 23 22 - 32 mmol/L   Glucose, Bld 139 (H) 65 - 99 mg/dL   BUN 23 (H) 6 - 20 mg/dL   Creatinine, Ser 1.13 0.61 - 1.24 mg/dL    Calcium 8.7 (L) 8.9 - 10.3 mg/dL   GFR calc non Af Amer 60 (L) >60 mL/min   GFR calc Af Amer >  60 >60 mL/min   Anion gap 7 5 - 15  CBC with Differential  Result Value Ref Range   WBC 8.1 4.0 - 10.5 K/uL   RBC 4.07 (L) 4.22 - 5.81 MIL/uL   Hemoglobin 11.6 (L) 13.0 - 17.0 g/dL   HCT 37.3 (L) 39.0 - 52.0 %   MCV 91.6 78.0 - 100.0 fL   MCH 28.5 26.0 - 34.0 pg   MCHC 31.1 30.0 - 36.0 g/dL   RDW 14.4 11.5 - 15.5 %   Platelets 257 150 - 400 K/uL   Neutrophils Relative % 80 %   Neutro Abs 6.5 1.7 - 7.7 K/uL   Lymphocytes Relative 12 %   Lymphs Abs 1.0 0.7 - 4.0 K/uL   Monocytes Relative 5 %   Monocytes Absolute 0.4 0.1 - 1.0 K/uL   Eosinophils Relative 2 %   Eosinophils Absolute 0.2 0.0 - 0.7 K/uL   Basophils Relative 1 %   Basophils Absolute 0.0 0.0 - 0.1 K/uL  Urinalysis, Routine w reflex microscopic (not at [facility])  Result Value Ref Range   Color, Urine YELLOW YELLOW   APPearance CLEAR CLEAR   Specific Gravity, Urine 1.017 1.005 - 1.030   pH 5.5 5.0 - 8.0   Glucose, UA NEGATIVE NEGATIVE mg/dL   Hgb urine dipstick NEGATIVE NEGATIVE   Bilirubin Urine NEGATIVE NEGATIVE   Ketones, ur NEGATIVE NEGATIVE mg/dL   Protein, ur NEGATIVE NEGATIVE mg/dL   Nitrite NEGATIVE NEGATIVE   Leukocytes, UA NEGATIVE NEGATIVE  Basic metabolic panel  Result Value Ref Range   Sodium 145 135 - 145 mmol/L   Potassium 4.1 3.5 - 5.1 mmol/L   Chloride 113 (H) 101 - 111 mmol/L   CO2 23 22 - 32 mmol/L   Glucose, Bld 92 65 - 99 mg/dL   BUN 23 (H) 6 - 20 mg/dL   Creatinine, Ser 1.04 0.61 - 1.24 mg/dL   Calcium 8.6 (L) 8.9 - 10.3 mg/dL   GFR calc non Af Amer >60 >60 mL/min   GFR calc Af Amer >60 >60 mL/min   Anion gap 9 5 - 15  CBC  Result Value Ref Range   WBC 7.8 4.0 - 10.5 K/uL   RBC 3.88 (L) 4.22 - 5.81 MIL/uL   Hemoglobin 11.2 (L) 13.0 - 17.0 g/dL   HCT 35.4 (L) 39.0 - 52.0 %   MCV 91.2 78.0 - 100.0 fL   MCH 28.9 26.0 - 34.0 pg   MCHC 31.6 30.0 - 36.0 g/dL   RDW 14.3 11.5 - 15.5 %   Platelets  230 150 - 400 K/uL  Glucose, capillary  Result Value Ref Range   Glucose-Capillary 69 65 - 99 mg/dL  Glucose, capillary  Result Value Ref Range   Glucose-Capillary 152 (H) 65 - 99 mg/dL  Glucose, capillary  Result Value Ref Range   Glucose-Capillary 80 65 - 99 mg/dL   Comment 1 Notify RN   Glucose, capillary  Result Value Ref Range   Glucose-Capillary 98 65 - 99 mg/dL   Comment 1 Notify RN   Basic metabolic panel  Result Value Ref Range   Sodium 143 135 - 145 mmol/L   Potassium 3.9 3.5 - 5.1 mmol/L   Chloride 112 (H) 101 - 111 mmol/L   CO2 25 22 - 32 mmol/L   Glucose, Bld 93 65 - 99 mg/dL   BUN 18 6 - 20 mg/dL   Creatinine, Ser 1.17 0.61 - 1.24 mg/dL   Calcium 8.6 (L) 8.9 - 10.3  mg/dL   GFR calc non Af Amer 57 (L) >60 mL/min   GFR calc Af Amer >60 >60 mL/min   Anion gap 6 5 - 15  CBC  Result Value Ref Range   WBC 7.2 4.0 - 10.5 K/uL   RBC 3.63 (L) 4.22 - 5.81 MIL/uL   Hemoglobin 10.5 (L) 13.0 - 17.0 g/dL   HCT 32.7 (L) 39.0 - 52.0 %   MCV 90.1 78.0 - 100.0 fL   MCH 28.9 26.0 - 34.0 pg   MCHC 32.1 30.0 - 36.0 g/dL   RDW 14.3 11.5 - 15.5 %   Platelets 210 150 - 400 K/uL  Glucose, capillary  Result Value Ref Range   Glucose-Capillary 143 (H) 65 - 99 mg/dL   Comment 1 Notify RN   Glucose, capillary  Result Value Ref Range   Glucose-Capillary 104 (H) 65 - 99 mg/dL  Glucose, capillary  Result Value Ref Range   Glucose-Capillary 88 65 - 99 mg/dL  Glucose, capillary  Result Value Ref Range   Glucose-Capillary 85 65 - 99 mg/dL  I-Stat CG4 Lactic Acid, ED  Result Value Ref Range   Lactic Acid, Venous 0.69 0.5 - 2.0 mmol/L   Ct Head Wo Contrast  05/15/2016  CLINICAL DATA:  Fall out of wheelchair onto face. Struck forehead on floor. No loss of consciousness. EXAM: CT HEAD WITHOUT CONTRAST CT CERVICAL SPINE WITHOUT CONTRAST TECHNIQUE: Multidetector CT imaging of the head and cervical spine was performed following the standard protocol without intravenous contrast.  Multiplanar CT image reconstructions of the cervical spine were also generated. COMPARISON:  Head CT 04/03/2016 FINDINGS: CT HEAD FINDINGS No intracranial hemorrhage, mass effect, or midline shift. Stable generalized atrophy and chronic small vessel ischemia. Arachnoid cyst in the posterior fossa is unchanged. No hydrocephalus. The basilar cisterns are patent. No evidence of territorial infarct. No intracranial fluid collection. Left frontal scalp hematoma. Calvarium is intact. Minimal mucosal thickening and left side of sphenoid sinus. Mastoid air cells are well-aerated. CT CERVICAL SPINE FINDINGS No fracture or acute subluxation. The dens is intact. There are no jumped or perched facets. Straightening and mild reversal of normal cervical lordosis. No listhesis. Disc space narrowing rule out most prominent at C5-C6 with endplate spurring. Multilevel facet arthropathy. No prevertebral soft tissue edema. IMPRESSION: 1. Left frontal scalp hematoma. No calvarial fracture or acute intracranial abnormality. Atrophy and chronic small vessel ischemia is unchanged. 2. Multilevel degenerative change throughout cervical spine. No acute fracture or subluxation. Electronically Signed   By: Jeb Levering M.D.   On: 05/15/2016 05:01   Ct Cervical Spine Wo Contrast  05/15/2016  CLINICAL DATA:  Fall out of wheelchair onto face. Struck forehead on floor. No loss of consciousness. EXAM: CT HEAD WITHOUT CONTRAST CT CERVICAL SPINE WITHOUT CONTRAST TECHNIQUE: Multidetector CT imaging of the head and cervical spine was performed following the standard protocol without intravenous contrast. Multiplanar CT image reconstructions of the cervical spine were also generated. COMPARISON:  Head CT 04/03/2016 FINDINGS: CT HEAD FINDINGS No intracranial hemorrhage, mass effect, or midline shift. Stable generalized atrophy and chronic small vessel ischemia. Arachnoid cyst in the posterior fossa is unchanged. No hydrocephalus. The basilar  cisterns are patent. No evidence of territorial infarct. No intracranial fluid collection. Left frontal scalp hematoma. Calvarium is intact. Minimal mucosal thickening and left side of sphenoid sinus. Mastoid air cells are well-aerated. CT CERVICAL SPINE FINDINGS No fracture or acute subluxation. The dens is intact. There are no jumped or perched facets. Straightening and mild  reversal of normal cervical lordosis. No listhesis. Disc space narrowing rule out most prominent at C5-C6 with endplate spurring. Multilevel facet arthropathy. No prevertebral soft tissue edema. IMPRESSION: 1. Left frontal scalp hematoma. No calvarial fracture or acute intracranial abnormality. Atrophy and chronic small vessel ischemia is unchanged. 2. Multilevel degenerative change throughout cervical spine. No acute fracture or subluxation. Electronically Signed   By: Jeb Levering M.D.   On: 05/15/2016 05:01    Stable for discharge.  Stop tugging on your foley catheter   I personally performed the services described in this documentation, which was scribed in my presence. The recorded information has been reviewed and is accurate.      Veatrice Kells, MD 05/15/16 [phone]

## 2016-05-15 NOTE — ED Notes (Signed)
Report called to [facility] and given to pt. RN.

## 2016-05-15 NOTE — Discharge Instructions (Signed)

## 2016-05-15 NOTE — ED Notes (Signed)
Foley irrigated with 3000 sterile water. FOley bag still filled with red fluid and no clots.

## 2016-05-16 NOTE — ED Notes (Signed)
[facility] called for transportation back to [facility]. Notified Monique, staff member  at [facility] that the pt would be returning to the facility.

## 2016-05-16 NOTE — ED Notes (Signed)
Verbal order given by Dr. Roxanne Mins to for 1L NS via continuous bladder irrigation. 475cc of dark bloody urine emptied from foley catheter prior to starting irrigation. 1L NS given via continuous bladder irrigation. Light bloody urine returned after irrigation. Pt tolerated procedure well.

## 2016-05-28 ENCOUNTER — Emergency Department (HOSPITAL_COMMUNITY)
Admission: EM | Admit: 2016-05-28 | Discharge: 2016-05-28 | Disposition: A | Discharge status: Skilled Nursing Facility | Payer: Medicare Other | Attending: Emergency Medicine | Admitting: Emergency Medicine

## 2016-05-28 ENCOUNTER — Encounter (HOSPITAL_COMMUNITY): Payer: Self-pay

## 2016-05-28 DIAGNOSIS — M79675 Pain in left toe(s): Secondary | ICD-10-CM | POA: Diagnosis not present

## 2016-05-28 DIAGNOSIS — G709 Myoneural disorder, unspecified: Secondary | ICD-10-CM | POA: Insufficient documentation

## 2016-05-28 DIAGNOSIS — F319 Bipolar disorder, unspecified: Secondary | ICD-10-CM | POA: Diagnosis not present

## 2016-05-28 DIAGNOSIS — G20 Parkinson's disease: Secondary | ICD-10-CM | POA: Insufficient documentation

## 2016-05-28 DIAGNOSIS — Z79899 Other long term (current) drug therapy: Secondary | ICD-10-CM | POA: Diagnosis not present

## 2016-05-28 DIAGNOSIS — E119 Type 2 diabetes mellitus without complications: Secondary | ICD-10-CM | POA: Diagnosis not present

## 2016-05-28 DIAGNOSIS — I10 Essential (primary) hypertension: Secondary | ICD-10-CM | POA: Insufficient documentation

## 2016-05-28 DIAGNOSIS — Z7982 Long term (current) use of aspirin: Secondary | ICD-10-CM | POA: Diagnosis not present

## 2016-05-28 DIAGNOSIS — M1990 Unspecified osteoarthritis, unspecified site: Secondary | ICD-10-CM | POA: Insufficient documentation

## 2016-05-28 DIAGNOSIS — Z711 Person with feared health complaint in whom no diagnosis is made: Secondary | ICD-10-CM

## 2016-05-28 DIAGNOSIS — M79674 Pain in right toe(s): Secondary | ICD-10-CM | POA: Diagnosis present

## 2016-05-28 NOTE — ED Notes (Signed)
Per EMS- Patient is a resident of the [facility]. Patient c/o bilateral foot pain. Patient has a history of dementia. Patient c/o both toes pointing outward and being painful. Patient states he may need his leg amputated. Patient oriented to name and place. No obvious injury or deformity.

## 2016-05-28 NOTE — ED Notes (Signed)
[facility] called for transportation back to the [facility].

## 2016-05-28 NOTE — ED Notes (Signed)
Report called to  [facility] at the [facility].

## 2016-05-28 NOTE — ED Provider Notes (Signed)
CSN: [id number]     Arrival date & time 05/28/16  1100 History   First MD Initiated Contact with Patient 05/28/16 1113     Chief Complaint  Patient presents with  . Foot Pain     (Consider location/radiation/quality/duration/timing/severity/associated sxs/prior Treatment) HPI   81 year old male with multiple complaints. Chief complaint seems to be the little toes of both feet. He is concerned that his toes are deviated medially and occasionally has some mild pain. He denies any acute trauma. The Exact Onset of the Symptoms. When Speaking with Them He Has Multiple Other Complaints. None of These Seem to Be Acute Though.  Past Medical History  Diagnosis Date  . Hearing aid worn   . Hypertension   . Parkinson disease ([place])   . Pneumonia   . Diabetes mellitus type II   . Arthritis   . Bipolar disorder ([place])   . Anxiety   . Depression   . MRSA infection (methicillin-resistant Staphylococcus aureus)   . Chronic indwelling Foley catheter   . C2 cervical fracture ([name])     due to pt fall  . SIRS (systemic inflammatory response syndrome) ([name])   . Memory loss   . Benign prostate hyperplasia   . Memory difficulty 08/21/2014  . Neuromuscular disorder ([place])     parkinsons  . Flu   . Falls   . UTI (urinary tract infection)    Past Surgical History  Procedure Laterality Date  . Total hip arthroplasty    . Video bronchoscopy  12/16/2012    Procedure: VIDEO BRONCHOSCOPY;  Surgeon: Melrose Nakayama, MD;  Location: [facility];  Service: Thoracic;  Laterality: Right;  . Video assisted thoracoscopy (vats)/empyema  12/16/2012    Procedure: VIDEO ASSISTED THORACOSCOPY (VATS)/EMPYEMA;  Surgeon: Melrose Nakayama, MD;  Location: [facility];  Service: Thoracic;  Laterality: Right;   Family History  Problem Relation Age of Onset  . Depression Father   . Cancer Mother   . Heart disease Mother    Social History  Substance Use Topics  . Smoking status: Never Smoker   . Smokeless tobacco: Never  Used  . Alcohol Use: No     Comment: occasional    Review of Systems  All systems reviewed and negative, other than as noted in HPI.   Allergies  Plant sterols and stanols  Home Medications   Prior to Admission medications   Medication Sig Start Date End Date Taking? Authorizing Provider  acetaminophen (TYLENOL) 325 MG tablet Take 650 mg by mouth every 6 (six) hours as needed for mild pain.    Historical Provider, MD  albuterol (PROVENTIL) (2.5 MG/3ML) 0.083% nebulizer solution Take 3 mLs (2.5 mg total) by nebulization every 2 (two) hours as needed for wheezing or shortness of breath. 04/21/14   Hosie Poisson, MD  amLODipine (NORVASC) 5 MG tablet Take 5 mg by mouth daily.    Historical Provider, MD  aspirin 81 MG chewable tablet Chew 81 mg by mouth daily.    Historical Provider, MD  bisacodyl (DULCOLAX) 10 MG suppository Place 10 mg rectally as needed for moderate constipation.    Historical Provider, MD  carbidopa-levodopa (SINEMET IR) 25-250 MG per tablet Take 1 tablet by mouth 3 (three) times daily.    Historical Provider, MD  clonazePAM (KLONOPIN) 0.5 MG tablet Take 0.25mg  bid as needed Patient taking differently: Take 0.25 mg by mouth 2 (two) times daily as needed for anxiety.  03/29/16   Kathlee Nations, MD  clopidogrel (PLAVIX) 75 MG  tablet Take 1 tablet (75 mg total) by mouth daily. Patient taking differently: Take 75 mg by mouth every evening.  10/07/15   Florencia Reasons, MD  entacapone (COMTAN) 200 MG tablet Take 200 mg by mouth 3 (three) times daily.    Historical Provider, MD  feeding supplement, GLUCERNA SHAKE, (GLUCERNA SHAKE) LIQD Take 237 mLs by mouth 2 (two) times daily between meals. 03/16/16   Kelvin Cellar, MD  ferrous sulfate 325 (65 FE) MG tablet Take 325 mg by mouth 2 (two) times daily.    Historical Provider, MD  guaifenesin (ROBITUSSIN) 100 MG/5ML syrup Take 200 mg by mouth 3 (three) times daily as needed for cough.    Historical Provider, MD  hydrALAZINE (APRESOLINE) 25 MG  tablet Take 25 mg by mouth 2 (two) times daily.     Historical Provider, MD  isosorbide mononitrate (IMDUR) 30 MG 24 hr tablet Take 1 tablet (30 mg total) by mouth daily. 10/07/15   Florencia Reasons, MD  lamoTRIgine (LAMICTAL) 150 MG tablet Take 1 tablet (150 mg total) by mouth 2 (two) times daily. 03/29/16   Kathlee Nations, MD  lisinopril (PRINIVIL,ZESTRIL) 10 MG tablet Take 0.5 tablets (5 mg total) by mouth daily. 10/07/15   Florencia Reasons, MD  loratadine (CLARITIN) 10 MG tablet Take 10 mg by mouth daily as needed for allergies.    Historical Provider, MD  Maltodextrin-Xanthan Gum (RESOURCE THICKENUP CLEAR) POWD Take 120 g by mouth as needed. Patient taking differently: Take 1 Can by mouth daily as needed (takes 120mg  as needed food thickener).  10/07/15   Florencia Reasons, MD  Multiple Vitamin (MULTIVITAMIN WITH MINERALS) TABS tablet Take 1 tablet by mouth daily.    Historical Provider, MD  pantoprazole (PROTONIX) 40 MG tablet Take 40 mg by mouth daily.    Historical Provider, MD  polyethylene glycol (MIRALAX / GLYCOLAX) packet Take 17 g by mouth daily.    Historical Provider, MD  predniSONE (DELTASONE) 2.5 MG tablet Take 2.5 mg by mouth daily with breakfast.    Historical Provider, MD  QUEtiapine (SEROQUEL) 25 MG tablet Take 3 tablets (75 mg total) by mouth at bedtime. 03/29/16   Kathlee Nations, MD  rosuvastatin (CRESTOR) 20 MG tablet Take 20 mg by mouth daily.    Historical Provider, MD  sertraline (ZOLOFT) 100 MG tablet Take 2 tablets (200 mg total) by mouth daily. 10/01/14   Kathlee Nations, MD  Skin Protectants, Misc. (BAZA PROTECT EX) Apply 1 application topically 2 (two) times daily.     Historical Provider, MD  traZODone (DESYREL) 100 MG tablet Take 1 tablet (100 mg total) by mouth at bedtime. 04/12/16   Kathlee Nations, MD   BP 141/68 mmHg  Pulse 60  Temp(Src) 98.1 F (36.7 C) (Oral)  Resp 18  SpO2 99% Physical Exam  Constitutional: He appears well-developed and well-nourished. No distress.  HENT:  Head:  Normocephalic and atraumatic.  Eyes: Conjunctivae are normal. Right eye exhibits no discharge. Left eye exhibits no discharge.  Neck: Neck supple.  Cardiovascular: Normal rate, regular rhythm and normal heart sounds.  Exam reveals no gallop and no friction rub.   No murmur heard. Pulmonary/Chest: Effort normal and breath sounds normal. No respiratory distress.  Abdominal: Soft. He exhibits no distension. There is no tenderness.  Musculoskeletal: He exhibits no edema or tenderness.  Onychomycosis otherwise feet are fairly unremarkable in appearance. The little toes of both feet are deviated medially. They generally have the appearance of wearing foot wear for the  past 8 decades. No acute abnormality noted. Good DP pulse. Good cap refill in toes. Sensation is intact to light touch. Strong plantar and dorsiflexion bilaterally.  Neurological: He is alert.  Skin: Skin is warm and dry.  Psychiatric: He has a normal mood and affect. His behavior is normal. Thought content normal.  Nursing note and vitals reviewed.   ED Course  Procedures (including critical care time) Labs Review Labs Reviewed - No data to display  Imaging Review No results found. I have personally reviewed and evaluated these images and lab results as part of my medical decision-making.   EKG Interpretation None      MDM   Final diagnoses:  Worried well    81yM with concerns about his feet and the ways his toes are pointing. His little toe is deviated medially on both feet. His feet/toes look like someone who has been wearing shoes for the past 81 years. There is no acute abnormality which I can appreciated. He does have some onychomycosis but again this is a chronic issue. He has multiple other complaints which seemed to accumulate more I talk with him. He does have history dementia. I generally have a very low suspicion for emergent process. Tried to reassure her. I do not feel that imaging or other workup is  indicated.    Virgel Manifold, MD 06/13/16 1152

## 2016-05-28 NOTE — ED Notes (Signed)
Bed: [id number] Expected date:  Expected time:  Means of arrival:  Comments: 81 yo foot pain

## 2016-06-09 ENCOUNTER — Telehealth: Payer: Self-pay | Admitting: Neurology

## 2016-06-09 ENCOUNTER — Ambulatory Visit: Payer: Medicare Other | Admitting: Neurology

## 2016-06-09 NOTE — Telephone Encounter (Signed)
This patient did not show for a revisit appointment today. 

## 2016-06-12 ENCOUNTER — Encounter: Payer: Self-pay | Admitting: Neurology

## 2016-07-06 ENCOUNTER — Ambulatory Visit (HOSPITAL_COMMUNITY): Payer: Self-pay | Admitting: Psychiatry

## 2016-07-06 ENCOUNTER — Ambulatory Visit (INDEPENDENT_AMBULATORY_CARE_PROVIDER_SITE_OTHER): Payer: Medicare Other | Admitting: Neurology

## 2016-07-06 ENCOUNTER — Encounter: Payer: Self-pay | Admitting: Neurology

## 2016-07-06 VITALS — BP 172/79 | HR 74 | Ht 69.0 in | Wt 164.0 lb

## 2016-07-06 DIAGNOSIS — R269 Unspecified abnormalities of gait and mobility: Secondary | ICD-10-CM | POA: Diagnosis not present

## 2016-07-06 DIAGNOSIS — R413 Other amnesia: Secondary | ICD-10-CM

## 2016-07-06 DIAGNOSIS — G20 Parkinson's disease: Secondary | ICD-10-CM | POA: Diagnosis not present

## 2016-07-06 DIAGNOSIS — F0390 Unspecified dementia without behavioral disturbance: Secondary | ICD-10-CM

## 2016-07-06 NOTE — Patient Instructions (Signed)
Fall Prevention in the Home  Falls can cause injuries and can affect people from all age groups. There are many simple things that you can do to make your home safe and to help prevent falls. WHAT CAN I DO ON THE OUTSIDE OF MY HOME?  Regularly repair the edges of walkways and driveways and fix any cracks.  Remove high doorway thresholds.  Trim any shrubbery on the main path into your home.  Use bright outdoor lighting.  Clear walkways of debris and clutter, including tools and rocks.  Regularly check that handrails are securely fastened and in good repair. Both sides of any steps should have handrails.  Install guardrails along the edges of any raised decks or porches.  Have leaves, snow, and ice cleared regularly.  Use sand or salt on walkways during winter months.  In the garage, clean up any spills right away, including grease or oil spills. WHAT CAN I DO IN THE BATHROOM?  Use night lights.  Install grab bars by the toilet and in the tub and shower. Do not use towel bars as grab bars.  Use non-skid mats or decals on the floor of the tub or shower.  If you need to sit down while you are in the shower, use a plastic, non-slip stool..  Keep the floor dry. Immediately clean up any water that spills on the floor.  Remove soap buildup in the tub or shower on a regular basis.  Attach bath mats securely with double-sided non-slip rug tape.  Remove throw rugs and other tripping hazards from the floor. WHAT CAN I DO IN THE BEDROOM?  Use night lights.  Make sure that a bedside light is easy to reach.  Do not use oversized bedding that drapes onto the floor.  Have a firm chair that has side arms to use for getting dressed.  Remove throw rugs and other tripping hazards from the floor. WHAT CAN I DO IN THE KITCHEN?   Clean up any spills right away.  Avoid walking on wet floors.  Place frequently used items in easy-to-reach places.  If you need to reach for something  above you, use a sturdy step stool that has a grab bar.  Keep electrical cables out of the way.  Do not use floor polish or wax that makes floors slippery. If you have to use wax, make sure that it is non-skid floor wax.  Remove throw rugs and other tripping hazards from the floor. WHAT CAN I DO IN THE STAIRWAYS?  Do not leave any items on the stairs.  Make sure that there are handrails on both sides of the stairs. Fix handrails that are broken or loose. Make sure that handrails are as long as the stairways.  Check any carpeting to make sure that it is firmly attached to the stairs. Fix any carpet that is loose or worn.  Avoid having throw rugs at the top or bottom of stairways, or secure the rugs with carpet tape to prevent them from moving.  Make sure that you have a light switch at the top of the stairs and the bottom of the stairs. If you do not have them, have them installed. WHAT ARE SOME OTHER FALL PREVENTION TIPS?  Wear closed-toe shoes that fit well and support your feet. Wear shoes that have rubber soles or low heels.  When you use a stepladder, make sure that it is completely opened and that the sides are firmly locked. Have someone hold the ladder while you   are using it. Do not climb a closed stepladder.  Add color or contrast paint or tape to grab bars and handrails in your home. Place contrasting color strips on the first and last steps.  Use mobility aids as needed, such as canes, walkers, scooters, and crutches.  Turn on lights if it is dark. Replace any light bulbs that burn out.  Set up furniture so that there are clear paths. Keep the furniture in the same spot.  Fix any uneven floor surfaces.  Choose a carpet design that does not hide the edge of steps of a stairway.  Be aware of any and all pets.  Review your medicines with your healthcare provider. Some medicines can cause dizziness or changes in blood pressure, which increase your risk of falling. Talk  with your health care provider about other ways that you can decrease your risk of falls. This may include working with a physical therapist or trainer to improve your strength, balance, and endurance.   This information is not intended to replace advice given to you by your health care provider. Make sure you discuss any questions you have with your health care provider.   Document Released: 11/24/2002 Document Revised: 04/20/2015 Document Reviewed: 01/08/2015 Elsevier Interactive Patient Education 2016 [employer]  

## 2016-07-06 NOTE — Progress Notes (Signed)
Reason for visit: Parkinson's disease, gait disorder  [name] is an 81 y.o. male  History of present illness:  [name] is a 81 year old right-handed white male with a history of a gait disorder and a history of parkinsonism. The patient continues to be a fall risk, he has been in the emergency room on 04/03/2016 with a fall. The patient has fallen several times at the extended care facility, the last fall was about 2 months ago. The patient walks with a walker, he generally is fairly stable with this, he has been asked to use a wheelchair unless somebody is with him. The patient has been set up for physical therapy, but he will refuse the therapy. The patient denies any issues with swallowing or choking. He is sleeping relatively well at night. He continues to have some issues with memory, possibly a mild decline since last seen. He returns for an evaluation.  Past Medical History  Diagnosis Date  . Hearing aid worn   . Hypertension   . Parkinson disease ([place])   . Pneumonia   . Diabetes mellitus type II   . Arthritis   . Bipolar disorder ([place])   . Anxiety   . Depression   . MRSA infection (methicillin-resistant Staphylococcus aureus)   . Chronic indwelling Foley catheter   . C2 cervical fracture ([name])     due to pt fall  . SIRS (systemic inflammatory response syndrome) ([name])   . Memory loss   . Benign prostate hyperplasia   . Memory difficulty 08/21/2014  . Neuromuscular disorder ([place])     parkinsons  . Flu   . Falls   . UTI (urinary tract infection)     Past Surgical History  Procedure Laterality Date  . Total hip arthroplasty    . Video bronchoscopy  12/16/2012    Procedure: VIDEO BRONCHOSCOPY;  Surgeon: Melrose Nakayama, MD;  Location: [facility];  Service: Thoracic;  Laterality: Right;  . Video assisted thoracoscopy (vats)/empyema  12/16/2012    Procedure: VIDEO ASSISTED THORACOSCOPY (VATS)/EMPYEMA;  Surgeon: Melrose Nakayama, MD;  Location: [facility];   Service: Thoracic;  Laterality: Right;    Family History  Problem Relation Age of Onset  . Depression Father   . Cancer Mother   . Heart disease Mother     Social history:  reports that he has never smoked. He has never used smokeless tobacco. He reports that he does not drink alcohol or use illicit drugs.    Allergies  Allergen Reactions  . Plant Sterols And Stanols Other (See Comments)    ON MAR    Medications:  Prior to Admission medications   Medication Sig Start Date End Date Taking? Authorizing Provider  acetaminophen (TYLENOL) 325 MG tablet Take 650 mg by mouth every 6 (six) hours as needed for mild pain.   Yes Historical Provider, MD  amLODipine (NORVASC) 5 MG tablet Take 5 mg by mouth daily.   Yes Historical Provider, MD  aspirin 81 MG chewable tablet Chew 81 mg by mouth daily.   Yes Historical Provider, MD  bisacodyl (DULCOLAX) 10 MG suppository Place 10 mg rectally as needed for moderate constipation.   Yes Historical Provider, MD  carbidopa-levodopa (SINEMET IR) 25-250 MG per tablet Take 1 tablet by mouth 3 (three) times daily.   Yes Historical Provider, MD  clonazePAM (KLONOPIN) 0.5 MG tablet Take 0.25mg  bid as needed Patient taking differently: Take 0.25 mg by mouth 2 (two) times daily as needed for anxiety.  03/29/16  Yes Kathlee Nations, MD  clopidogrel (PLAVIX) 75 MG tablet Take 1 tablet (75 mg total) by mouth daily. Patient taking differently: Take 75 mg by mouth every evening.  10/07/15  Yes Florencia Reasons, MD  entacapone (COMTAN) 200 MG tablet Take 200 mg by mouth 3 (three) times daily.   Yes Historical Provider, MD  ferrous sulfate 325 (65 FE) MG tablet Take 325 mg by mouth 2 (two) times daily.   Yes Historical Provider, MD  guaifenesin (ROBITUSSIN) 100 MG/5ML syrup Take 200 mg by mouth 3 (three) times daily as needed for cough.   Yes Historical Provider, MD  hydrALAZINE (APRESOLINE) 25 MG tablet Take 25 mg by mouth 2 (two) times daily.    Yes Historical Provider, MD    isosorbide mononitrate (IMDUR) 30 MG 24 hr tablet Take 1 tablet (30 mg total) by mouth daily. 10/07/15  Yes Florencia Reasons, MD  lamoTRIgine (LAMICTAL) 150 MG tablet Take 1 tablet (150 mg total) by mouth 2 (two) times daily. 03/29/16  Yes Kathlee Nations, MD  lisinopril (PRINIVIL,ZESTRIL) 10 MG tablet Take 0.5 tablets (5 mg total) by mouth daily. 10/07/15  Yes Florencia Reasons, MD  loratadine (CLARITIN) 10 MG tablet Take 10 mg by mouth daily as needed for allergies.   Yes Historical Provider, MD  Maltodextrin-Xanthan Gum (RESOURCE THICKENUP CLEAR) POWD Take 120 g by mouth as needed. Patient taking differently: Take 1 Can by mouth daily as needed (takes 120mg  as needed food thickener).  10/07/15  Yes Florencia Reasons, MD  Multiple Vitamin (MULTIVITAMIN WITH MINERALS) TABS tablet Take 1 tablet by mouth daily.   Yes Historical Provider, MD  polyethylene glycol (MIRALAX / GLYCOLAX) packet Take 17 g by mouth daily.   Yes Historical Provider, MD  predniSONE (DELTASONE) 2.5 MG tablet Take 2.5 mg by mouth daily with breakfast.   Yes Historical Provider, MD  QUEtiapine (SEROQUEL) 25 MG tablet Take 3 tablets (75 mg total) by mouth at bedtime. 03/29/16  Yes Kathlee Nations, MD  rosuvastatin (CRESTOR) 20 MG tablet Take 20 mg by mouth daily.   Yes Historical Provider, MD  sertraline (ZOLOFT) 100 MG tablet Take 2 tablets (200 mg total) by mouth daily. 10/01/14  Yes Kathlee Nations, MD  Skin Protectants, Misc. (BAZA PROTECT EX) Apply 1 application topically 2 (two) times daily.    Yes Historical Provider, MD  traZODone (DESYREL) 100 MG tablet Take 1 tablet (100 mg total) by mouth at bedtime. 04/12/16  Yes Kathlee Nations, MD  albuterol (PROVENTIL) (2.5 MG/3ML) 0.083% nebulizer solution Take 3 mLs (2.5 mg total) by nebulization every 2 (two) hours as needed for wheezing or shortness of breath. Patient not taking: Reported on 07/06/2016 04/21/14   Hosie Poisson, MD  feeding supplement, GLUCERNA SHAKE, (GLUCERNA SHAKE) LIQD Take 237 mLs by mouth 2 (two)  times daily between meals. Patient not taking: Reported on 07/06/2016 03/16/16   Kelvin Cellar, MD  pantoprazole (PROTONIX) 40 MG tablet Take 40 mg by mouth daily. Reported on 07/06/2016    Historical Provider, MD    ROS:  Out of a complete 14 system review of symptoms, the patient complains only of the following symptoms, and all other reviewed systems are negative.  Blurred vision Rectal bleeding Difficulty urinating Skin wounds, buttocks Bruising easily Memory loss Confusion  Blood pressure 172/79, pulse 74, height 5\' 9"  (1.753 m), weight 164 lb (74.39 kg).  Physical Exam  General: The patient is alert and cooperative at the time of the examination.  Skin: No significant peripheral edema is noted.   Neurologic Exam  Mental status: The patient is alert and oriented x 2 at the time of the examination (not oriented to date). The Mini-Mental Status Examination done today shows a total score 21/30.   Cranial nerves: Facial symmetry is present. Speech is normal, no aphasia or dysarthria is noted. Extraocular movements are full. Visual fields are full.  Motor: The patient has good strength in all 4 extremities.  Sensory examination: Soft touch sensation is symmetric on the face, arms, and legs.  Coordination: The patient has good finger-nose-finger and heel-to-shin bilaterally.  Gait and station: The patient has a slightly unsteady gait, the patient walks with a walker, appears to have good stride and good turns with the walker. Tandem gait was not attempted. Romberg is unsteady, the patient has a tendency to go backwards. No drift is seen.  Reflexes: Deep tendon reflexes are symmetric.   Assessment/Plan:  1. Parkinsonism  2. Gait disorder  3. Memory disorder  The patient continues to have some issues with walking. He has good stability while using a walker, he mainly uses a wheelchair when he is by himself. The patient refuses physical therapy for gait training. The  memory issues will be followed, we may consider adding a medication for memory on the next visit. Over time, he has been relatively stable with his Mini-Mental Status Examination, but his last visit he scored 26/30 and only 21/30 today. He will follow-up in 6 months.  Jill Alexanders MD 07/06/2016 8:18 PM  [facility] [address] [place], [place] [address]  Phone [phone] Fax [phone]

## 2016-08-31 ENCOUNTER — Ambulatory Visit (INDEPENDENT_AMBULATORY_CARE_PROVIDER_SITE_OTHER): Payer: Medicare Other | Admitting: Psychiatry

## 2016-08-31 ENCOUNTER — Encounter (HOSPITAL_COMMUNITY): Payer: Self-pay | Admitting: Psychiatry

## 2016-08-31 DIAGNOSIS — F3162 Bipolar disorder, current episode mixed, moderate: Secondary | ICD-10-CM

## 2016-08-31 DIAGNOSIS — F319 Bipolar disorder, unspecified: Secondary | ICD-10-CM

## 2016-08-31 MED ORDER — QUETIAPINE FUMARATE 25 MG PO TABS: 75.0000 mg | ORAL_TABLET | Freq: Every day | ORAL | 2 refills | Status: DC

## 2016-08-31 MED ORDER — SERTRALINE HCL 100 MG PO TABS: 200.0000 mg | ORAL_TABLET | Freq: Every day | ORAL | 2 refills | Status: DC

## 2016-08-31 MED ORDER — CLONAZEPAM 0.5 MG PO TABS: 0.2500 mg | ORAL_TABLET | Freq: Two times a day (BID) | ORAL | 1 refills | Status: DC | PRN

## 2016-08-31 MED ORDER — LAMOTRIGINE 150 MG PO TABS: 150.0000 mg | ORAL_TABLET | Freq: Two times a day (BID) | ORAL | 2 refills | Status: DC

## 2016-08-31 MED ORDER — TRAZODONE HCL 50 MG PO TABS: 50.0000 mg | ORAL_TABLET | Freq: Every day | ORAL | 2 refills | Status: DC

## 2016-08-31 NOTE — Progress Notes (Signed)
[facility] [phone] Progress Note  SYDNEY USSELMAN [id number] 81 y.o.  08/31/2016 10:37 AM  Chief Complaint:  I am doing better.  I'm sleeping better.                History of Present Illness:  Domynic came for his appointment with his aide.  He is taking his medication as prescribed.  In June he was admitted to the ER because of fall and his trazodone was reduced to 50 mg only.  He was also seeing Dr. Juanda Crumble his neurologist in July and there has been no changes.  His aide reported he has no agitation, anger or any mood swing.  He does have memory problem which has been slowly getting worse.  He denies any hallucination, paranoia or any crying spells.  He is taking Klonopin 0.25 mg twice a day, trazodone 50 mg at bedtime, Seroquel 75 mg at bedtime, Zoloft 200 mg daily and Lamictal 150 mg twice a day.  He has no rash, itching, headaches or any other concerns with his psychiatric medication.  He has mild tremors which is chronic in nature.  His energy level is fair.  He admitted doing exercise 20 minutes every day on a stationary bike which he likes it.  Patient is pleasant in conversation.  He wants to continue his current psychiatric medication.  He has no EPS.  His appetite is okay.  His vital signs are stable.  Suicidal Ideation: No Plan Formed: No Patient has means to carry out plan: No  Homicidal Ideation: No Plan Formed: No Patient has means to carry out plan: No  Review of Systems  Constitutional: Negative.   HENT: Positive for hearing loss.   Musculoskeletal: Positive for back pain.  Skin: Negative.  Negative for itching and rash.  Neurological: Positive for tremors.       Memory impairment, unsteady gait.    Medical history Patient has history of diabetes mellitus, hypertension, Parkinson disease, gait instability, hearing and memory impairment. He has multiple UTIs.  He see Dr. Floyde Parkins.    Outpatient Encounter Prescriptions as of 08/31/2016  Medication Sig  Dispense Refill  . acetaminophen (TYLENOL) 325 MG tablet Take 650 mg by mouth every 6 (six) hours as needed for mild pain.    Marland Kitchen albuterol (PROVENTIL) (2.5 MG/3ML) 0.083% nebulizer solution Take 3 mLs (2.5 mg total) by nebulization every 2 (two) hours as needed for wheezing or shortness of breath. (Patient not taking: Reported on 07/06/2016) 75 mL 12  . amLODipine (NORVASC) 5 MG tablet Take 5 mg by mouth daily.    Marland Kitchen aspirin 81 MG chewable tablet Chew 81 mg by mouth daily.    . bisacodyl (DULCOLAX) 10 MG suppository Place 10 mg rectally as needed for moderate constipation.    . carbidopa-levodopa (SINEMET IR) 25-250 MG per tablet Take 1 tablet by mouth 3 (three) times daily.    . clonazePAM (KLONOPIN) 0.5 MG tablet Take 0.5 tablets (0.25 mg total) by mouth 2 (two) times daily as needed for anxiety. 30 tablet 1  . clopidogrel (PLAVIX) 75 MG tablet Take 1 tablet (75 mg total) by mouth daily. (Patient taking differently: Take 75 mg by mouth every evening. ) 30 tablet 0  . entacapone (COMTAN) 200 MG tablet Take 200 mg by mouth 3 (three) times daily.    . feeding supplement, GLUCERNA SHAKE, (GLUCERNA SHAKE) LIQD Take 237 mLs by mouth 2 (two) times daily between meals. (Patient not taking: Reported on 07/06/2016) 30 Can 0  .  ferrous sulfate 325 (65 FE) MG tablet Take 325 mg by mouth 2 (two) times daily.    Marland Kitchen guaifenesin (ROBITUSSIN) 100 MG/5ML syrup Take 200 mg by mouth 3 (three) times daily as needed for cough.    . hydrALAZINE (APRESOLINE) 25 MG tablet Take 25 mg by mouth 2 (two) times daily.     . isosorbide mononitrate (IMDUR) 30 MG 24 hr tablet Take 1 tablet (30 mg total) by mouth daily. 30 tablet 0  . lamoTRIgine (LAMICTAL) 150 MG tablet Take 1 tablet (150 mg total) by mouth 2 (two) times daily. 60 tablet 2  . lisinopril (PRINIVIL,ZESTRIL) 10 MG tablet Take 0.5 tablets (5 mg total) by mouth daily. 30 tablet 0  . loratadine (CLARITIN) 10 MG tablet Take 10 mg by mouth daily as needed for allergies.    .  Maltodextrin-Xanthan Gum (RESOURCE THICKENUP CLEAR) POWD Take 120 g by mouth as needed. (Patient taking differently: Take 1 Can by mouth daily as needed (takes 120mg  as needed food thickener). ) 30 Can 0  . Multiple Vitamin (MULTIVITAMIN WITH MINERALS) TABS tablet Take 1 tablet by mouth daily.    . pantoprazole (PROTONIX) 40 MG tablet Take 40 mg by mouth daily. Reported on 07/06/2016    . polyethylene glycol (MIRALAX / GLYCOLAX) packet Take 17 g by mouth daily.    . predniSONE (DELTASONE) 2.5 MG tablet Take 2.5 mg by mouth daily with breakfast.    . QUEtiapine (SEROQUEL) 25 MG tablet Take 3 tablets (75 mg total) by mouth at bedtime. 90 tablet 2  . rosuvastatin (CRESTOR) 20 MG tablet Take 20 mg by mouth daily.    . sertraline (ZOLOFT) 100 MG tablet Take 2 tablets (200 mg total) by mouth daily. 60 tablet 2  . Skin Protectants, Misc. (BAZA PROTECT EX) Apply 1 application topically 2 (two) times daily.     . traZODone (DESYREL) 50 MG tablet Take 1 tablet (50 mg total) by mouth at bedtime. 30 tablet 2  . [DISCONTINUED] clonazePAM (KLONOPIN) 0.5 MG tablet Take 0.25mg  bid as needed (Patient taking differently: Take 0.25 mg by mouth 2 (two) times daily as needed for anxiety. ) 20 tablet 1  . [DISCONTINUED] lamoTRIgine (LAMICTAL) 150 MG tablet Take 1 tablet (150 mg total) by mouth 2 (two) times daily. 60 tablet 2  . [DISCONTINUED] QUEtiapine (SEROQUEL) 25 MG tablet Take 3 tablets (75 mg total) by mouth at bedtime. 90 tablet 2  . [DISCONTINUED] sertraline (ZOLOFT) 100 MG tablet Take 2 tablets (200 mg total) by mouth daily. 60 tablet 1  . [DISCONTINUED] traZODone (DESYREL) 100 MG tablet Take 1 tablet (100 mg total) by mouth at bedtime. 30 tablet 2   No facility-administered encounter medications on file as of 08/31/2016.    No results found for this or any previous visit (from the past 2160 hour(s)).  Past Psychiatric History/Hospitalization(s): Patient has significant history of bipolar disorder.  He has  multiple psychiatric admission due to decompensation and noncompliance of medication.  Patient has history of aggression and impulsive behavior.  His last psychiatric admission was in 2005. Anxiety: Yes Bipolar Disorder: Yes Depression: No Mania: No Psychosis: No Schizophrenia: No Personality Disorder: No Hospitalization for psychiatric illness: Yes History of Electroconvulsive Shock Therapy: No Prior Suicide Attempts: No  Physical Exam: Constitutional:  BP 122/72   Pulse 62   Ht 5' 9.5" (1.765 m)   Wt 168 lb (76.2 kg)   BMI 24.45 kg/m   No results found for this or any previous visit (from  the past 2160 hour(s)).   General Appearance: Patient is pleasant but confused.  He is fairly groomed.  Musculoskeletal: Strength & Muscle Tone: decreased and atrophy Gait & Station: unsteady Patient leans: Front  Mental status examination Patient is casually dressed and fairly groomed. He maintained fair eye contact.  He is pleasant and cooperative.  He described his mood euthymic and his affect is appropriate.  His gait is unsteady and he need walker to walk around.  His cognition is impaired .  His speech is soft and non-spontaneous.   His thought process is slow but logical and goal directed.  He denies any active or passive suicidal thoughts or homicidal thoughts.  He denies any auditory or visual hallucination.  His attention and concentration is distracted.  There were no delusions , paranoia or obsession present at this time.  He has fine tremors and has difficulty walking.  He need walker .  His gait is unsteady.  He's alert and oriented x3.  His insight judgment and impulse control is fair.  Established Problem, Stable/Improving (1), Review of Psycho-Social Stressors (1), Review or order clinical lab tests (1), Review and summation of old records (2), New Problem, with no additional work-up planned (3), Review of Last Therapy Session (1), Review of Medication Regimen & Side Effects (2)  and Review of New Medication or Change in Dosage (2)  Assessment:  Axis I: Bipolar disorder, cognitive disorder NOS  Plan:  I reviewed records, recent blood work results and current medication.  We will reduce his trazodone to 50 mg only as 100 mg may be causing fall .  He is taking multiple psychiatric medication.  Explain risk of postural hypotension, fall and dizziness in detail.  I will continue Klonopin 0.25 mg twice a day as needed.Continue Lamictal 150 mg 2 times a day, Seroquel 75 mg at bedtime, Zoloft 200 mg daily.  Patient has no rash itching or headaches. Discussed medication side effects and benefits.  Discuss safety plan that anytime having active suicidal thoughts or homicidal thoughts then he need to call 911 or go to the local emergency room.  I will see him in 3 months.  Time spent 25 minutes.  More than 50% of the time spent in psychoeducation, counseling and coordination of care.   Reka Wist T., MD 08/31/2016    Patient ID: [name], male   DOB: 22-Aug-1935, 81 y.o.   MRN: [id number]

## 2016-10-19 ENCOUNTER — Emergency Department (HOSPITAL_COMMUNITY)
Admission: EM | Admit: 2016-10-19 | Discharge: 2016-10-19 | Disposition: A | Discharge status: Home / Self Care | Payer: Medicare Other | Attending: Emergency Medicine | Admitting: Emergency Medicine

## 2016-10-19 ENCOUNTER — Emergency Department (HOSPITAL_COMMUNITY): Payer: Medicare Other

## 2016-10-19 ENCOUNTER — Encounter (HOSPITAL_COMMUNITY): Payer: Self-pay | Admitting: Emergency Medicine

## 2016-10-19 DIAGNOSIS — R200 Anesthesia of skin: Secondary | ICD-10-CM | POA: Diagnosis present

## 2016-10-19 DIAGNOSIS — Z79899 Other long term (current) drug therapy: Secondary | ICD-10-CM | POA: Diagnosis not present

## 2016-10-19 DIAGNOSIS — I10 Essential (primary) hypertension: Secondary | ICD-10-CM | POA: Diagnosis not present

## 2016-10-19 DIAGNOSIS — Z7982 Long term (current) use of aspirin: Secondary | ICD-10-CM | POA: Diagnosis not present

## 2016-10-19 DIAGNOSIS — M79671 Pain in right foot: Secondary | ICD-10-CM | POA: Diagnosis not present

## 2016-10-19 DIAGNOSIS — R202 Paresthesia of skin: Secondary | ICD-10-CM | POA: Diagnosis not present

## 2016-10-19 DIAGNOSIS — G20 Parkinson's disease: Secondary | ICD-10-CM | POA: Diagnosis not present

## 2016-10-19 DIAGNOSIS — Z96641 Presence of right artificial hip joint: Secondary | ICD-10-CM | POA: Diagnosis not present

## 2016-10-19 DIAGNOSIS — M79672 Pain in left foot: Secondary | ICD-10-CM

## 2016-10-19 DIAGNOSIS — E119 Type 2 diabetes mellitus without complications: Secondary | ICD-10-CM | POA: Diagnosis not present

## 2016-10-19 LAB — CBC WITH DIFFERENTIAL/PLATELET
Basophils Absolute: 0 10*3/uL (ref 0.0–0.1)
Basophils Relative: 0 %
Eosinophils Absolute: 0.3 10*3/uL (ref 0.0–0.7)
Eosinophils Relative: 3 %
HCT: 37.6 % — ABNORMAL LOW (ref 39.0–52.0)
Hemoglobin: 12.5 g/dL — ABNORMAL LOW (ref 13.0–17.0)
Lymphocytes Relative: 16 %
Lymphs Abs: 1.5 10*3/uL (ref 0.7–4.0)
MCH: 30.1 pg (ref 26.0–34.0)
MCHC: 33.2 g/dL (ref 30.0–36.0)
MCV: 90.6 fL (ref 78.0–100.0)
Monocytes Absolute: 0.6 10*3/uL (ref 0.1–1.0)
Monocytes Relative: 6 %
Neutro Abs: 7 10*3/uL (ref 1.7–7.7)
Neutrophils Relative %: 75 %
Platelets: 243 10*3/uL (ref 150–400)
RBC: 4.15 MIL/uL — ABNORMAL LOW (ref 4.22–5.81)
RDW: 14.5 % (ref 11.5–15.5)
WBC: 9.3 10*3/uL (ref 4.0–10.5)

## 2016-10-19 LAB — BASIC METABOLIC PANEL
Anion gap: 6 (ref 5–15)
BUN: 29 mg/dL — ABNORMAL HIGH (ref 6–20)
CO2: 26 mmol/L (ref 22–32)
Calcium: 8.8 mg/dL — ABNORMAL LOW (ref 8.9–10.3)
Chloride: 107 mmol/L (ref 101–111)
Creatinine, Ser: 1.47 mg/dL — ABNORMAL HIGH (ref 0.61–1.24)
GFR calc Af Amer: 51 mL/min — ABNORMAL LOW (ref >60)
GFR calc non Af Amer: 44 mL/min — ABNORMAL LOW (ref >60)
Glucose, Bld: 104 mg/dL — ABNORMAL HIGH (ref 65–99)
Potassium: 4.5 mmol/L (ref 3.5–5.1)
Sodium: 139 mmol/L (ref 135–145)

## 2016-10-19 LAB — MAGNESIUM: Magnesium: 2.2 mg/dL (ref 1.7–2.4)

## 2016-10-19 NOTE — ED Notes (Signed)
Bed: WA21 Expected date:  Expected time:  Means of arrival:  Comments: EMS-forgot 

## 2016-10-19 NOTE — ED Provider Notes (Signed)
[facility] DEPT Provider Note   CSN: [id number] Arrival date & time: 10/19/16  1738     History   Chief Complaint Chief Complaint  Patient presents with  . Extremity Weakness    HPI [name] is a 81 y.o. male.  HPI   81 year old male presents today with complaints of extremity numbness. Patient's power of attorney is at bedside who helps provide some information. Patient notes that for the last 4 days he's had reported numbness to his bilateral lower extremities. Patient is unable to describe this feeling, reports that he can feel when somebody touches his legs, but it feels numb. Patient is unable to tell where the numbness starts. He reports that normally he is able to use a walker to get in and out of his wheelchair and into bed, but has been unable to do this over the last several days. Patient denies any recent trauma, denies any neck pain, headache, back pain and abdominal pain. He denies any fever or recent illnesses.   Past Medical History:  Diagnosis Date  . Anxiety   . Arthritis   . Benign prostate hyperplasia   . Bipolar disorder ([place])   . C2 cervical fracture ([name])    due to pt fall  . Chronic indwelling Foley catheter   . Depression   . Diabetes mellitus type II   . Falls   . Flu   . Hearing aid worn   . Hypertension   . Memory difficulty 08/21/2014  . Memory loss   . MRSA infection (methicillin-resistant Staphylococcus aureus)   . Neuromuscular disorder ([place])    parkinsons  . Parkinson disease ([place])   . Pneumonia   . SIRS (systemic inflammatory response syndrome) ([name])   . UTI (urinary tract infection)     Patient Active Problem List   Diagnosis Date Noted  . Bacteremia 03/14/2016  . HCAP (healthcare-associated pneumonia)   . Pressure ulcer 10/01/2015  . Right lower lobe pneumonia ([place]) 10/01/2015  . Sepsis due to Gram-negative organism with acute respiratory failure ([place]) 10/01/2015  . Acute renal failure ([place]) 10/01/2015  . Acute  respiratory failure with hypoxia ([place]) 09/30/2015  . Influenza A (H1N1) 03/25/2015  . Dementia without behavioral disturbance 03/25/2015  . Hyperkalemia 03/25/2015  . Fall 03/16/2015  . Sacral pressure ulcer 03/16/2015  . Fever 03/16/2015  . Memory difficulty 08/21/2014  . Malnutrition of moderate degree ([place]) 04/19/2014  . UTI (lower urinary tract infection) 04/18/2014  . Hypoxia 04/18/2014  . PNA (pneumonia) 04/18/2014  . Chronic diastolic congestive heart failure ([place]) 02/01/2013  . Pericardial effusion 02/01/2013  . Bradycardia 01/31/2013  . Empyema lung ([place]) 12/23/2012  . S/P thoracotomy 12/23/2012  . Abnormality of gait 12/05/2012  . Hypernatremia 08/13/2012  . Hypertension   . Diabetes mellitus, type 2 ([place])   . Arthritis   . Bipolar disorder ([place])   . Anxiety   . Chronic indwelling Foley catheter   . Neuromuscular disorder ([place])   . Bacteremia of undetermined etiology 06/17/2012  . Parkinson disease ([place]) 04/16/2012  . Elevated troponin 04/16/2012  . Hearing aid worn   . Diabetes mellitus ([place]) 07/06/2011    Past Surgical History:  Procedure Laterality Date  . TOTAL HIP ARTHROPLASTY    . VIDEO ASSISTED THORACOSCOPY (VATS)/EMPYEMA  12/16/2012   Procedure: VIDEO ASSISTED THORACOSCOPY (VATS)/EMPYEMA;  Surgeon: Melrose Nakayama, MD;  Location: [facility];  Service: Thoracic;  Laterality: Right;  Marland Kitchen VIDEO BRONCHOSCOPY  12/16/2012   Procedure: VIDEO BRONCHOSCOPY;  Surgeon: Remo Lipps  Chaya Jan, MD;  Location: [facility];  Service: Thoracic;  Laterality: Right;       Home Medications    Prior to Admission medications   Medication Sig Start Date End Date Taking? Authorizing Provider  amLODipine (NORVASC) 5 MG tablet Take 5 mg by mouth daily.   Yes Historical Provider, MD  aspirin 81 MG chewable tablet Chew 81 mg by mouth daily.   Yes Historical Provider, MD  carbidopa-levodopa (SINEMET IR) 25-250 MG per tablet Take 1 tablet by mouth 3 (three) times daily.   Yes Historical  Provider, MD  clopidogrel (PLAVIX) 75 MG tablet Take 1 tablet (75 mg total) by mouth daily. Patient taking differently: Take 75 mg by mouth every evening.  10/07/15  Yes Florencia Reasons, MD  entacapone (COMTAN) 200 MG tablet Take 200 mg by mouth 3 (three) times daily.   Yes Historical Provider, MD  feeding supplement, GLUCERNA SHAKE, (GLUCERNA SHAKE) LIQD Take 237 mLs by mouth 2 (two) times daily between meals. 03/16/16  Yes Kelvin Cellar, MD  ferrous sulfate 325 (65 FE) MG tablet Take 325 mg by mouth 2 (two) times daily.   Yes Historical Provider, MD  hydrALAZINE (APRESOLINE) 25 MG tablet Take 25 mg by mouth 2 (two) times daily.    Yes Historical Provider, MD  isosorbide mononitrate (IMDUR) 30 MG 24 hr tablet Take 1 tablet (30 mg total) by mouth daily. 10/07/15  Yes Florencia Reasons, MD  lamoTRIgine (LAMICTAL) 150 MG tablet Take 1 tablet (150 mg total) by mouth 2 (two) times daily. 08/31/16  Yes Kathlee Nations, MD  lisinopril (PRINIVIL,ZESTRIL) 10 MG tablet Take 0.5 tablets (5 mg total) by mouth daily. 10/07/15  Yes Florencia Reasons, MD  Multiple Vitamin (MULTIVITAMIN WITH MINERALS) TABS tablet Take 1 tablet by mouth daily.   Yes Historical Provider, MD  pantoprazole (PROTONIX) 40 MG tablet Take 40 mg by mouth daily. Reported on 07/06/2016   Yes Historical Provider, MD  polyethylene glycol (MIRALAX / GLYCOLAX) packet Take 17 g by mouth daily.   Yes Historical Provider, MD  predniSONE (DELTASONE) 2.5 MG tablet Take 2.5 mg by mouth daily with breakfast.   Yes Historical Provider, MD  QUEtiapine (SEROQUEL) 25 MG tablet Take 3 tablets (75 mg total) by mouth at bedtime. 08/31/16  Yes Kathlee Nations, MD  rosuvastatin (CRESTOR) 20 MG tablet Take 20 mg by mouth daily.   Yes Historical Provider, MD  sertraline (ZOLOFT) 100 MG tablet Take 2 tablets (200 mg total) by mouth daily. 08/31/16  Yes Kathlee Nations, MD  Skin Protectants, Misc. (BAZA PROTECT EX) Apply 1 application topically 2 (two) times daily.    Yes Historical Provider, MD    traZODone (DESYREL) 50 MG tablet Take 1 tablet (50 mg total) by mouth at bedtime. 08/31/16  Yes Kathlee Nations, MD  acetaminophen (TYLENOL) 325 MG tablet Take 650 mg by mouth every 6 (six) hours as needed for mild pain.    Historical Provider, MD  albuterol (PROVENTIL) (2.5 MG/3ML) 0.083% nebulizer solution Take 3 mLs (2.5 mg total) by nebulization every 2 (two) hours as needed for wheezing or shortness of breath. Patient not taking: Reported on 10/19/2016 04/21/14   Hosie Poisson, MD  bisacodyl (DULCOLAX) 10 MG suppository Place 10 mg rectally as needed for moderate constipation.    Historical Provider, MD  clonazePAM (KLONOPIN) 0.5 MG tablet Take 0.5 tablets (0.25 mg total) by mouth 2 (two) times daily as needed for anxiety. 08/31/16   Kathlee Nations, MD  guaifenesin (  ROBITUSSIN) 100 MG/5ML syrup Take 200 mg by mouth 3 (three) times daily as needed for cough.    Historical Provider, MD  loratadine (CLARITIN) 10 MG tablet Take 10 mg by mouth daily as needed for allergies.    Historical Provider, MD  Maltodextrin-Xanthan Gum (RESOURCE THICKENUP CLEAR) POWD Take 120 g by mouth as needed. Patient taking differently: Take 1 Can by mouth daily as needed (takes 120mg  as needed food thickener).  10/07/15   Florencia Reasons, MD    Family History Family History  Problem Relation Age of Onset  . Depression Father   . Cancer Mother   . Heart disease Mother     Social History Social History  Substance Use Topics  . Smoking status: Never Smoker  . Smokeless tobacco: Never Used  . Alcohol use No     Comment: occasional     Allergies   Plant sterols and stanols   Review of Systems Review of Systems  All other systems reviewed and are negative.    Physical Exam Updated Vital Signs BP 129/75 (BP Location: Right Arm)   Pulse 76   Resp 18   Ht 5\' 10"  (1.778 m)   Wt 73.5 kg   SpO2 95%   BMI 23.24 kg/m   Physical Exam  Constitutional: He appears well-developed and well-nourished. No distress.   HENT:  Head: Normocephalic.  Eyes: Pupils are equal, round, and reactive to light.  Cardiovascular: Normal rate.   Pulmonary/Chest: Effort normal.  Abdominal: Soft.  Musculoskeletal: He exhibits no edema.  No C,T, or L spine TTP. Full active ROM of upper lower extremities, strength 5/5. Pt unable to relax for reflex exam. Distal sensation grossly intact, reports reduced compared to upper extremity   Nursing note and vitals reviewed.   ED Treatments / Results  Labs (all labs ordered are listed, but only abnormal results are displayed) Labs Reviewed  CBC WITH DIFFERENTIAL/PLATELET - Abnormal; Notable for the following:       Result Value   RBC 4.15 (*)    Hemoglobin 12.5 (*)    HCT 37.6 (*)    All other components within normal limits  BASIC METABOLIC PANEL - Abnormal; Notable for the following:    Glucose, Bld 104 (*)    BUN 29 (*)    Creatinine, Ser 1.47 (*)    Calcium 8.8 (*)    GFR calc non Af Amer 44 (*)    GFR calc Af Amer 51 (*)    All other components within normal limits  MAGNESIUM    EKG  EKG Interpretation None      Radiology Ct Head Wo Contrast  Result Date: 10/19/2016 CLINICAL DATA:  Left leg numbness and weakness since yesterday morning. EXAM: CT HEAD WITHOUT CONTRAST TECHNIQUE: Contiguous axial images were obtained from the base of the skull through the vertex without intravenous contrast. COMPARISON:  05/15/2016. FINDINGS: Brain: Diffusely enlarged ventricles and subarachnoid spaces. Patchy white matter low density in both cerebral hemispheres. Stable posterior fossa arachnoid cyst. No intracranial hemorrhage, mass lesion or CT evidence of acute infarction. Vascular: No hyperdense vessel or unexpected calcification. Skull: Normal. Negative for fracture or focal lesion. Sinuses/Orbits: Small amount of fluid in the sphenoid sinus. Unremarkable orbits. Other: None. IMPRESSION: 1. No acute intracranial abnormality. 2. Stable atrophy, chronic small vessel white  matter ischemic changes and posterior fossa arachnoid cyst. 3. Mild acute sphenoid sinusitis. Electronically Signed   By: Claudie Revering M.D.   On: 10/19/2016 19:38    Procedures Procedures (  including critical care time)  Medications Ordered in ED Medications - No data to display   Initial Impression / Assessment and Plan / ED Course  I have reviewed the triage vital signs and the nursing notes.  Pertinent labs & imaging results that were available during my care of the patient were reviewed by me and considered in my medical decision making (see chart for details).  Clinical Course    Final Clinical Impressions(s) / ED Diagnoses   Final diagnoses:  Pain in both feet  Paresthesia    Labs:  Cbc, BMP, Magnesium   Imaging: CT head  Consults:  Therapeutics:  Discharge Meds:   Assessment/Plan:  4 YOM presents today with initial complaints of LE weakness, and decreased sensation. On exam pt has intact sensation and significant strength.. Pt is able to bear weight without difficulty. Upon reassessment pt is angry as he thought he was here to see a podiatrist. He denies that he had complaints of lower extremity strength and sensation. Pt POA is at bedside reporting he had similar complaints to her. Pt is requesting discharge home. I see no acute process here that would require continued evaluation or management in the emergency room setting. Pt will be discharged home with strict return precautions. Pt is instructed to follow up with laboratory analysis via my chart. He is instructed to follow up with PCP and podiatry as needed. No further questions or concerns at the time of discharge.     New Prescriptions New Prescriptions   No medications on file     Okey Regal, PA-C 10/19/16 2026    Tanna Furry, MD 10/20/16 [id number]

## 2016-10-19 NOTE — ED Notes (Signed)
Leg bag emptied-343mL

## 2016-10-19 NOTE — Discharge Instructions (Signed)
Please read attached information. If you experience any new or worsening signs or symptoms please return to the emergency room for evaluation. Please follow-up with your primary care provider or specialist as discussed.  °

## 2016-10-19 NOTE — ED Triage Notes (Addendum)
Per EMS p/t brought in from [facility] c/o Leg numbness and weakness since yesterday morning. Pt can feel sensation upon EMS assessment. Negative stroke screen. Hx of diabetes and Parkinson's.

## 2016-10-23 ENCOUNTER — Encounter: Payer: Self-pay | Admitting: Podiatry

## 2016-10-23 ENCOUNTER — Ambulatory Visit (INDEPENDENT_AMBULATORY_CARE_PROVIDER_SITE_OTHER): Payer: Medicare Other

## 2016-10-23 ENCOUNTER — Ambulatory Visit (INDEPENDENT_AMBULATORY_CARE_PROVIDER_SITE_OTHER): Payer: Medicare Other | Admitting: Podiatry

## 2016-10-23 ENCOUNTER — Ambulatory Visit: Payer: Medicare Other

## 2016-10-23 DIAGNOSIS — M79676 Pain in unspecified toe(s): Secondary | ICD-10-CM | POA: Diagnosis not present

## 2016-10-23 DIAGNOSIS — B351 Tinea unguium: Secondary | ICD-10-CM

## 2016-10-23 DIAGNOSIS — B353 Tinea pedis: Secondary | ICD-10-CM

## 2016-10-23 DIAGNOSIS — R52 Pain, unspecified: Secondary | ICD-10-CM

## 2016-10-23 MED ORDER — KETOCONAZOLE 2 % EX CREA: 1.0000 "application " | TOPICAL_CREAM | Freq: Every day | CUTANEOUS | 2 refills | Status: DC

## 2016-10-24 ENCOUNTER — Ambulatory Visit: Payer: Medicare Other | Admitting: Sports Medicine

## 2016-10-24 MED ORDER — NONFORMULARY OR COMPOUNDED ITEM: 2 refills | Status: DC

## 2016-10-30 NOTE — Progress Notes (Signed)
Subjective:     Patient ID: [name], male   DOB: [birth year], 81 y.o.   MRN: [id number]  HPI 81 year old male presents the office today stating he has some issues with his feet. He states he has fungus on his toenails is going onto the skin. He's had no recent treatment for this. This appears to be issue why he was at the appointment. However his caregiver/family member who accompanied him states at the end of the visit today that he was having pain to his legs and this started after he got a flu shot. He hasn't of coming appointment with his primary care physician on Wednesday. He states he had some difficulty trying to walk. He is a poor historian unable to determine if there is any injury or trauma.  Review of Systems  All other systems reviewed and are negative.      Objective:   Physical Exam General: AAO x3, NAD  Dermatological: Nails are hypertrophic, dystrophic, discolored how there is no tenderness the nails there is no swelling redness or drainage today. There is also dry, scaly, erythematous skin interdigitally to the dorsal plantar aspect of the foot. There is no open lesions identified. There does appear to be some dry skin to the leg however.  Vascular: Dorsalis Pedis artery and Posterior Tibial artery pedal pulses are 1/4 bilateral with immedate capillary fill time.There is no pain with calf compression, swelling, warmth, erythema.   Neruologic: Sensation appears to be somewhat decreased with [name] monofilament  Musculoskeletal: Unable to elicit any area pinpoint bony tenderness or pain the vibratory sensation. There is no significant edema, erythema, increase in warmth.Range of motion intact.  Gait: Unassisted, Nonantalgic.      Assessment:     Onychomycosis, tinea pedis bilateral lower extremity weakness    Plan:     -Treatment options discussed including all alternatives, risks, and complications -Etiology of symptoms were discussed -X-rays were  obtained and reviewed with the patient. No evidence of acute fracture identified. -Ordered onychomycosis cream through [facility] and ketoconazole -Follow-up with PCP as well -Discussed possible PT -Monitor for any signs or symptoms of infection call the office. -Office and is not resolved within 2 weeks or sooner if any issues. He is encouraged to call any questions or concerns. His caregiver as well as he states understanding and agrees to the plan.  Celesta Gentile, DPM

## 2016-11-29 ENCOUNTER — Emergency Department (HOSPITAL_COMMUNITY): Payer: Medicare Other

## 2016-11-29 ENCOUNTER — Encounter (HOSPITAL_COMMUNITY): Payer: Self-pay | Admitting: *Deleted

## 2016-11-29 ENCOUNTER — Inpatient Hospital Stay (HOSPITAL_COMMUNITY)
Admission: EM | Admit: 2016-11-29 | Discharge: 2016-12-02 | DRG: 195 | Disposition: A | Discharge status: Skilled Nursing Facility | Payer: Medicare Other | Attending: Family Medicine | Admitting: Family Medicine

## 2016-11-29 DIAGNOSIS — I10 Essential (primary) hypertension: Secondary | ICD-10-CM | POA: Diagnosis present

## 2016-11-29 DIAGNOSIS — W19XXXA Unspecified fall, initial encounter: Secondary | ICD-10-CM | POA: Diagnosis not present

## 2016-11-29 DIAGNOSIS — Z96649 Presence of unspecified artificial hip joint: Secondary | ICD-10-CM | POA: Diagnosis present

## 2016-11-29 DIAGNOSIS — R296 Repeated falls: Secondary | ICD-10-CM | POA: Diagnosis present

## 2016-11-29 DIAGNOSIS — Z7902 Long term (current) use of antithrombotics/antiplatelets: Secondary | ICD-10-CM

## 2016-11-29 DIAGNOSIS — Y92121 Bathroom in nursing home as the place of occurrence of the external cause: Secondary | ICD-10-CM | POA: Diagnosis not present

## 2016-11-29 DIAGNOSIS — E119 Type 2 diabetes mellitus without complications: Secondary | ICD-10-CM | POA: Diagnosis present

## 2016-11-29 DIAGNOSIS — F0390 Unspecified dementia without behavioral disturbance: Secondary | ICD-10-CM | POA: Diagnosis present

## 2016-11-29 DIAGNOSIS — J189 Pneumonia, unspecified organism: Principal | ICD-10-CM | POA: Diagnosis present

## 2016-11-29 DIAGNOSIS — Z7982 Long term (current) use of aspirin: Secondary | ICD-10-CM

## 2016-11-29 DIAGNOSIS — Z66 Do not resuscitate: Secondary | ICD-10-CM | POA: Diagnosis present

## 2016-11-29 DIAGNOSIS — S0181XA Laceration without foreign body of other part of head, initial encounter: Secondary | ICD-10-CM | POA: Diagnosis present

## 2016-11-29 DIAGNOSIS — F319 Bipolar disorder, unspecified: Secondary | ICD-10-CM | POA: Diagnosis present

## 2016-11-29 DIAGNOSIS — Z87440 Personal history of urinary (tract) infections: Secondary | ICD-10-CM

## 2016-11-29 DIAGNOSIS — S0083XA Contusion of other part of head, initial encounter: Secondary | ICD-10-CM | POA: Diagnosis present

## 2016-11-29 DIAGNOSIS — G20 Parkinson's disease: Secondary | ICD-10-CM | POA: Diagnosis present

## 2016-11-29 DIAGNOSIS — W1830XA Fall on same level, unspecified, initial encounter: Secondary | ICD-10-CM | POA: Diagnosis present

## 2016-11-29 DIAGNOSIS — Z818 Family history of other mental and behavioral disorders: Secondary | ICD-10-CM

## 2016-11-29 DIAGNOSIS — S0990XA Unspecified injury of head, initial encounter: Secondary | ICD-10-CM

## 2016-11-29 DIAGNOSIS — N390 Urinary tract infection, site not specified: Secondary | ICD-10-CM

## 2016-11-29 DIAGNOSIS — S0101XA Laceration without foreign body of scalp, initial encounter: Secondary | ICD-10-CM | POA: Diagnosis present

## 2016-11-29 DIAGNOSIS — Z8249 Family history of ischemic heart disease and other diseases of the circulatory system: Secondary | ICD-10-CM

## 2016-11-29 LAB — CBC WITH DIFFERENTIAL/PLATELET
BASOS ABS: 0 10*3/uL (ref 0.0–0.1)
Basophils Relative: 0 %
EOS PCT: 0 %
Eosinophils Absolute: 0 10*3/uL (ref 0.0–0.7)
HEMATOCRIT: 37.2 % — AB (ref 39.0–52.0)
Hemoglobin: 12 g/dL — ABNORMAL LOW (ref 13.0–17.0)
LYMPHS ABS: 0.4 10*3/uL — AB (ref 0.7–4.0)
LYMPHS PCT: 3 %
MCH: 29.9 pg (ref 26.0–34.0)
MCHC: 32.3 g/dL (ref 30.0–36.0)
MCV: 92.8 fL (ref 78.0–100.0)
MONO ABS: 0.4 10*3/uL (ref 0.1–1.0)
Monocytes Relative: 4 %
NEUTROS ABS: 10.6 10*3/uL — AB (ref 1.7–7.7)
Neutrophils Relative %: 93 %
Platelets: 168 10*3/uL (ref 150–400)
RBC: 4.01 MIL/uL — ABNORMAL LOW (ref 4.22–5.81)
RDW: 15.2 % (ref 11.5–15.5)
WBC: 11.4 10*3/uL — ABNORMAL HIGH (ref 4.0–10.5)

## 2016-11-29 LAB — URINALYSIS, ROUTINE W REFLEX MICROSCOPIC
Bilirubin Urine: NEGATIVE
GLUCOSE, UA: NEGATIVE mg/dL
HGB URINE DIPSTICK: NEGATIVE
KETONES UR: NEGATIVE mg/dL
Nitrite: NEGATIVE
PROTEIN: 100 mg/dL — AB
Specific Gravity, Urine: 1.02 (ref 1.005–1.030)
pH: 8 (ref 5.0–8.0)

## 2016-11-29 LAB — BASIC METABOLIC PANEL
ANION GAP: 10 (ref 5–15)
BUN: 29 mg/dL — AB (ref 6–20)
CALCIUM: 8.8 mg/dL — AB (ref 8.9–10.3)
CO2: 24 mmol/L (ref 22–32)
Chloride: 107 mmol/L (ref 101–111)
Creatinine, Ser: 1.08 mg/dL (ref 0.61–1.24)
GFR calc Af Amer: >60 mL/min (ref >60)
GFR calc non Af Amer: >60 mL/min (ref >60)
GLUCOSE: 131 mg/dL — AB (ref 65–99)
POTASSIUM: 4 mmol/L (ref 3.5–5.1)
Sodium: 141 mmol/L (ref 135–145)

## 2016-11-29 LAB — I-STAT CG4 LACTIC ACID, ED: Lactic Acid, Venous: 0.9 mmol/L (ref 0.5–1.9)

## 2016-11-29 MED ORDER — POLYETHYLENE GLYCOL 3350 17 G PO PACK
17.0000 g | PACK | Freq: Every day | ORAL | Status: DC
  Administered 2016-11-30 – 2016-12-02 (×3): 17 g via ORAL
  Filled 2016-11-29 (×3): qty 1

## 2016-11-29 MED ORDER — GLUCERNA SHAKE PO LIQD
237.0000 mL | Freq: Two times a day (BID) | ORAL | Status: DC
  Administered 2016-11-30 – 2016-12-02 (×5): 237 mL via ORAL

## 2016-11-29 MED ORDER — LAMOTRIGINE 25 MG PO TABS
150.0000 mg | ORAL_TABLET | Freq: Two times a day (BID) | ORAL | Status: DC
  Administered 2016-11-30 – 2016-12-02 (×6): 150 mg via ORAL
  Filled 2016-11-29 (×6): qty 2

## 2016-11-29 MED ORDER — CLONAZEPAM 0.5 MG PO TABS: 0.2500 mg | ORAL_TABLET | Freq: Two times a day (BID) | ORAL | Status: DC | PRN

## 2016-11-29 MED ORDER — LIDOCAINE-EPINEPHRINE 2 %-1:100000 IJ SOLN
20.0000 mL | Freq: Once | INTRAMUSCULAR | Status: AC
  Administered 2016-11-29: 20 mL via INTRADERMAL

## 2016-11-29 MED ORDER — AZITHROMYCIN 500 MG PO TABS
500.0000 mg | ORAL_TABLET | ORAL | Status: DC
  Administered 2016-11-30 – 2016-12-01 (×3): 500 mg via ORAL
  Filled 2016-11-29 (×3): qty 1

## 2016-11-29 MED ORDER — RESOURCE THICKENUP CLEAR PO POWD: 1.0000 | Freq: Every day | ORAL | Status: DC | PRN

## 2016-11-29 MED ORDER — GUAIFENESIN 100 MG/5ML PO SOLN
200.0000 mg | Freq: Three times a day (TID) | ORAL | Status: DC | PRN
  Administered 2016-11-30: 200 mg via ORAL
  Filled 2016-11-29 (×2): qty 5

## 2016-11-29 MED ORDER — TRAZODONE HCL 50 MG PO TABS
50.0000 mg | ORAL_TABLET | Freq: Every day | ORAL | Status: DC
  Administered 2016-11-30 – 2016-12-01 (×3): 50 mg via ORAL
  Filled 2016-11-29 (×3): qty 1

## 2016-11-29 MED ORDER — ASPIRIN 81 MG PO CHEW
81.0000 mg | CHEWABLE_TABLET | Freq: Every day | ORAL | Status: DC
  Administered 2016-11-30 – 2016-12-02 (×3): 81 mg via ORAL
  Filled 2016-11-29 (×3): qty 1

## 2016-11-29 MED ORDER — DEXTROSE 5 % IV SOLN
1.0000 g | Freq: Once | INTRAVENOUS | Status: AC
  Administered 2016-11-29: 1 g via INTRAVENOUS
  Filled 2016-11-29: qty 10

## 2016-11-29 MED ORDER — DEXTROSE 5 % IV SOLN
1.0000 g | INTRAVENOUS | Status: DC
  Administered 2016-11-30 – 2016-12-01 (×2): 1 g via INTRAVENOUS
  Filled 2016-11-29 (×3): qty 10

## 2016-11-29 MED ORDER — ADULT MULTIVITAMIN W/MINERALS CH
1.0000 | ORAL_TABLET | Freq: Every day | ORAL | Status: DC
  Administered 2016-11-30 – 2016-12-02 (×3): 1 via ORAL
  Filled 2016-11-29 (×3): qty 1

## 2016-11-29 MED ORDER — QUETIAPINE FUMARATE 25 MG PO TABS
75.0000 mg | ORAL_TABLET | Freq: Every day | ORAL | Status: DC
  Administered 2016-11-30 – 2016-12-01 (×3): 75 mg via ORAL
  Filled 2016-11-29 (×3): qty 3

## 2016-11-29 MED ORDER — PANTOPRAZOLE SODIUM 40 MG PO TBEC
40.0000 mg | DELAYED_RELEASE_TABLET | Freq: Every day | ORAL | Status: DC
  Administered 2016-11-30 – 2016-12-02 (×3): 40 mg via ORAL
  Filled 2016-11-29 (×3): qty 1

## 2016-11-29 MED ORDER — ROSUVASTATIN CALCIUM 20 MG PO TABS
20.0000 mg | ORAL_TABLET | Freq: Every day | ORAL | Status: DC
  Administered 2016-11-30 – 2016-12-01 (×3): 20 mg via ORAL
  Filled 2016-11-29 (×3): qty 1

## 2016-11-29 MED ORDER — SERTRALINE HCL 100 MG PO TABS
200.0000 mg | ORAL_TABLET | Freq: Every day | ORAL | Status: DC
  Administered 2016-11-30 – 2016-12-02 (×3): 200 mg via ORAL
  Filled 2016-11-29 (×3): qty 2

## 2016-11-29 MED ORDER — LIDOCAINE-EPINEPHRINE (PF) 1 %-1:200000 IJ SOLN
20.0000 mL | Freq: Once | INTRAMUSCULAR | Status: DC
  Filled 2016-11-29: qty 20

## 2016-11-29 MED ORDER — ENTACAPONE 200 MG PO TABS
200.0000 mg | ORAL_TABLET | Freq: Three times a day (TID) | ORAL | Status: DC
  Administered 2016-11-30 – 2016-12-02 (×9): 200 mg via ORAL
  Filled 2016-11-29 (×13): qty 1

## 2016-11-29 MED ORDER — ACETAMINOPHEN 325 MG PO TABS: 650.0000 mg | ORAL_TABLET | Freq: Four times a day (QID) | ORAL | Status: DC | PRN

## 2016-11-29 MED ORDER — PREDNISONE 2.5 MG PO TABS
2.5000 mg | ORAL_TABLET | Freq: Every day | ORAL | Status: DC
  Administered 2016-11-30 – 2016-12-02 (×3): 2.5 mg via ORAL
  Filled 2016-11-29 (×4): qty 1

## 2016-11-29 MED ORDER — ISOSORBIDE MONONITRATE ER 30 MG PO TB24
30.0000 mg | ORAL_TABLET | Freq: Every day | ORAL | Status: DC
  Administered 2016-11-30 – 2016-12-02 (×3): 30 mg via ORAL
  Filled 2016-11-29 (×3): qty 1

## 2016-11-29 MED ORDER — CLOPIDOGREL BISULFATE 75 MG PO TABS
75.0000 mg | ORAL_TABLET | Freq: Every evening | ORAL | Status: DC
  Administered 2016-12-01: 75 mg via ORAL
  Filled 2016-11-29 (×2): qty 1

## 2016-11-29 MED ORDER — CARBIDOPA-LEVODOPA 25-250 MG PO TABS
1.0000 | ORAL_TABLET | Freq: Three times a day (TID) | ORAL | Status: DC
  Administered 2016-11-30 – 2016-12-02 (×9): 1 via ORAL
  Filled 2016-11-29 (×13): qty 1

## 2016-11-29 MED ORDER — FERROUS SULFATE 325 (65 FE) MG PO TABS
325.0000 mg | ORAL_TABLET | Freq: Two times a day (BID) | ORAL | Status: DC
  Administered 2016-11-30 – 2016-12-02 (×5): 325 mg via ORAL
  Filled 2016-11-29 (×5): qty 1

## 2016-11-29 MED ORDER — LISINOPRIL 5 MG PO TABS
5.0000 mg | ORAL_TABLET | Freq: Every day | ORAL | Status: DC
  Administered 2016-11-30 – 2016-12-02 (×3): 5 mg via ORAL
  Filled 2016-11-29 (×3): qty 1

## 2016-11-29 MED ORDER — BISACODYL 10 MG RE SUPP: 10.0000 mg | RECTAL | Status: DC | PRN

## 2016-11-29 MED ORDER — HYDRALAZINE HCL 25 MG PO TABS
25.0000 mg | ORAL_TABLET | Freq: Two times a day (BID) | ORAL | Status: DC
  Administered 2016-11-30 – 2016-12-02 (×6): 25 mg via ORAL
  Filled 2016-11-29 (×6): qty 1

## 2016-11-29 MED ORDER — AMLODIPINE BESYLATE 5 MG PO TABS
5.0000 mg | ORAL_TABLET | Freq: Every day | ORAL | Status: DC
  Administered 2016-11-30 – 2016-12-02 (×3): 5 mg via ORAL
  Filled 2016-11-29 (×3): qty 1

## 2016-11-29 NOTE — ED Notes (Signed)
Patient returned from Radiology. 

## 2016-11-29 NOTE — ED Provider Notes (Signed)
[facility] DEPT Provider Note   CSN: [id number] Arrival date & time: 11/29/16  1458     History   Chief Complaint Chief Complaint  Patient presents with  . Fall    HPI [name] is a 81 y.o. male With a past medical history significant for Parkinson's, diabetes, bipolar disorder, and urinary retention with Foley catheter dependence who presents with a fall and subsequent head injury, productive cough, and change in urine color. Patient is accompanied by caregiver. According to patient, he has had multiple falls in the last two days. He fell striking his head on the back and front just before arrival. Patient has two lacerations that are bandaged. Patient denied loss of consciousness. Patient reports a productive cough that has been going on for several weeks. There is a green sputum. Patient also reports that he has had darkened urine compared to prior. He uses a Foley catheter. He denies abdominal pain. He reports some chills but no chest pain or shortness of breath. He denies other symptoms.    The history is provided by the patient, a relative, a caregiver and medical records. No language interpreter was used.  Fall  This is a recurrent problem. The current episode started 1 to 2 hours ago. The problem occurs constantly. The problem has not changed since onset.Associated symptoms include headaches. Pertinent negatives include no chest pain, no abdominal pain and no shortness of breath. Nothing aggravates the symptoms. Nothing relieves the symptoms. He has tried nothing for the symptoms.  Cough  This is a new problem. The current episode started more than 1 week ago. The problem occurs constantly. The problem has not changed since onset.The cough is productive of sputum. Associated symptoms include chills and headaches. Pertinent negatives include no chest pain, no rhinorrhea, no shortness of breath and no wheezing. He has tried nothing for the symptoms.    Past Medical  History:  Diagnosis Date  . Anxiety   . Arthritis   . Benign prostate hyperplasia   . Bipolar disorder ([place])   . C2 cervical fracture ([name])    due to pt fall  . Chronic indwelling Foley catheter   . Depression   . Diabetes mellitus type II   . Falls   . Flu   . Hearing aid worn   . Hypertension   . Memory difficulty 08/21/2014  . Memory loss   . MRSA infection (methicillin-resistant Staphylococcus aureus)   . Neuromuscular disorder ([place])    parkinsons  . Parkinson disease ([place])   . Pneumonia   . SIRS (systemic inflammatory response syndrome) ([name])   . UTI (urinary tract infection)     Patient Active Problem List   Diagnosis Date Noted  . Bacteremia 03/14/2016  . HCAP (healthcare-associated pneumonia)   . Pressure ulcer 10/01/2015  . Right lower lobe pneumonia ([place]) 10/01/2015  . Sepsis due to Gram-negative organism with acute respiratory failure ([place]) 10/01/2015  . Acute renal failure ([place]) 10/01/2015  . Acute respiratory failure with hypoxia ([place]) 09/30/2015  . Influenza A (H1N1) 03/25/2015  . Dementia without behavioral disturbance 03/25/2015  . Hyperkalemia 03/25/2015  . Fall 03/16/2015  . Sacral pressure ulcer 03/16/2015  . Fever 03/16/2015  . Memory difficulty 08/21/2014  . Malnutrition of moderate degree ([place]) 04/19/2014  . UTI (lower urinary tract infection) 04/18/2014  . Hypoxia 04/18/2014  . PNA (pneumonia) 04/18/2014  . Chronic diastolic congestive heart failure ([place]) 02/01/2013  . Pericardial effusion 02/01/2013  . Bradycardia 01/31/2013  . Empyema lung ([place])  12/23/2012  . S/P thoracotomy 12/23/2012  . Abnormality of gait 12/05/2012  . Hypernatremia 08/13/2012  . Hypertension   . Diabetes mellitus, type 2 ([place])   . Arthritis   . Bipolar disorder ([place])   . Anxiety   . Chronic indwelling Foley catheter   . Neuromuscular disorder ([place])   . Bacteremia of undetermined etiology 06/17/2012  . Parkinson disease ([place]) 04/16/2012  . Elevated troponin  04/16/2012  . Hearing aid worn   . Diabetes mellitus ([place]) 07/06/2011    Past Surgical History:  Procedure Laterality Date  . TOTAL HIP ARTHROPLASTY    . VIDEO ASSISTED THORACOSCOPY (VATS)/EMPYEMA  12/16/2012   Procedure: VIDEO ASSISTED THORACOSCOPY (VATS)/EMPYEMA;  Surgeon: Melrose Nakayama, MD;  Location: [facility];  Service: Thoracic;  Laterality: Right;  Marland Kitchen VIDEO BRONCHOSCOPY  12/16/2012   Procedure: VIDEO BRONCHOSCOPY;  Surgeon: Melrose Nakayama, MD;  Location: [facility];  Service: Thoracic;  Laterality: Right;       Home Medications    Prior to Admission medications   Medication Sig Start Date End Date Taking? Authorizing Provider  acetaminophen (TYLENOL) 325 MG tablet Take 650 mg by mouth every 6 (six) hours as needed for mild pain.    Historical Provider, MD  albuterol (PROVENTIL) (2.5 MG/3ML) 0.083% nebulizer solution Take 3 mLs (2.5 mg total) by nebulization every 2 (two) hours as needed for wheezing or shortness of breath. Patient not taking: Reported on 10/19/2016 04/21/14   Hosie Poisson, MD  amLODipine (NORVASC) 5 MG tablet Take 5 mg by mouth daily.    Historical Provider, MD  aspirin 81 MG chewable tablet Chew 81 mg by mouth daily.    Historical Provider, MD  bisacodyl (DULCOLAX) 10 MG suppository Place 10 mg rectally as needed for moderate constipation.    Historical Provider, MD  carbidopa-levodopa (SINEMET IR) 25-250 MG per tablet Take 1 tablet by mouth 3 (three) times daily.    Historical Provider, MD  clonazePAM (KLONOPIN) 0.5 MG tablet Take 0.5 tablets (0.25 mg total) by mouth 2 (two) times daily as needed for anxiety. 08/31/16   Kathlee Nations, MD  clopidogrel (PLAVIX) 75 MG tablet Take 1 tablet (75 mg total) by mouth daily. Patient taking differently: Take 75 mg by mouth every evening.  10/07/15   Florencia Reasons, MD  entacapone (COMTAN) 200 MG tablet Take 200 mg by mouth 3 (three) times daily.    Historical Provider, MD  feeding supplement, GLUCERNA SHAKE, (GLUCERNA SHAKE)  LIQD Take 237 mLs by mouth 2 (two) times daily between meals. 03/16/16   Kelvin Cellar, MD  ferrous sulfate 325 (65 FE) MG tablet Take 325 mg by mouth 2 (two) times daily.    Historical Provider, MD  guaifenesin (ROBITUSSIN) 100 MG/5ML syrup Take 200 mg by mouth 3 (three) times daily as needed for cough.    Historical Provider, MD  hydrALAZINE (APRESOLINE) 25 MG tablet Take 25 mg by mouth 2 (two) times daily.     Historical Provider, MD  isosorbide mononitrate (IMDUR) 30 MG 24 hr tablet Take 1 tablet (30 mg total) by mouth daily. 10/07/15   Florencia Reasons, MD  ketoconazole (NIZORAL) 2 % cream Apply 1 application topically daily. 10/23/16   Trula Slade, DPM  lamoTRIgine (LAMICTAL) 150 MG tablet Take 1 tablet (150 mg total) by mouth 2 (two) times daily. 08/31/16   Kathlee Nations, MD  lisinopril (PRINIVIL,ZESTRIL) 10 MG tablet Take 0.5 tablets (5 mg total) by mouth daily. 10/07/15   Florencia Reasons, MD  loratadine (CLARITIN) 10 MG tablet Take 10 mg by mouth daily as needed for allergies.    Historical Provider, MD  Maltodextrin-Xanthan Gum (RESOURCE THICKENUP CLEAR) POWD Take 120 g by mouth as needed. Patient taking differently: Take 1 Can by mouth daily as needed (takes 120mg  as needed food thickener).  10/07/15   Florencia Reasons, MD  Multiple Vitamin (MULTIVITAMIN WITH MINERALS) TABS tablet Take 1 tablet by mouth daily.    Historical Provider, MD  NONFORMULARY OR COMPOUNDED [place]:  Onychomycosis nail lacquer - Fluconazole 2%, Terbinafine 1%, DMSO apply to affected area daily. 10/24/16   Trula Slade, DPM  pantoprazole (PROTONIX) 40 MG tablet Take 40 mg by mouth daily. Reported on 07/06/2016    Historical Provider, MD  polyethylene glycol (MIRALAX / GLYCOLAX) packet Take 17 g by mouth daily.    Historical Provider, MD  predniSONE (DELTASONE) 2.5 MG tablet Take 2.5 mg by mouth daily with breakfast.    Historical Provider, MD  QUEtiapine (SEROQUEL) 25 MG tablet Take 3 tablets (75 mg total) by mouth at  bedtime. 08/31/16   Kathlee Nations, MD  rosuvastatin (CRESTOR) 20 MG tablet Take 20 mg by mouth daily.    Historical Provider, MD  sertraline (ZOLOFT) 100 MG tablet Take 2 tablets (200 mg total) by mouth daily. 08/31/16   Kathlee Nations, MD  Skin Protectants, Misc. (BAZA PROTECT EX) Apply 1 application topically 2 (two) times daily.     Historical Provider, MD  traZODone (DESYREL) 50 MG tablet Take 1 tablet (50 mg total) by mouth at bedtime. 08/31/16   Kathlee Nations, MD    Family History Family History  Problem Relation Age of Onset  . Depression Father   . Cancer Mother   . Heart disease Mother     Social History Social History  Substance Use Topics  . Smoking status: Never Smoker  . Smokeless tobacco: Never Used  . Alcohol use No     Comment: occasional     Allergies   Plant sterols and stanols   Review of Systems Review of Systems  Constitutional: Positive for chills and fatigue. Negative for activity change, diaphoresis and fever.  HENT: Positive for congestion. Negative for rhinorrhea.   Eyes: Negative for visual disturbance.  Respiratory: Positive for cough. Negative for chest tightness, shortness of breath, wheezing and stridor.   Cardiovascular: Negative for chest pain, palpitations and leg swelling.  Gastrointestinal: Negative for abdominal distention, abdominal pain, blood in stool, constipation, diarrhea, nausea and vomiting.  Genitourinary: Negative for difficulty urinating, dysuria and flank pain.       Urine discolored, foley in place   Musculoskeletal: Negative for back pain.  Skin: Positive for wound. Negative for rash.  Neurological: Positive for headaches. Negative for dizziness, weakness and light-headedness.  Psychiatric/Behavioral: Negative for agitation.  All other systems reviewed and are negative.    Physical Exam Updated Vital Signs Ht 5\' 10"  (1.778 m)   Wt 159 lb (72.1 kg)   BMI 22.81 kg/m   Physical Exam  Constitutional: He appears  well-developed and well-nourished.  HENT:  Head: Head is with laceration.    Mouth/Throat: Oropharynx is clear and moist. No oropharyngeal exudate.  Eyes: Conjunctivae and EOM are normal. Pupils are equal, round, and reactive to light.  Neck: Normal range of motion. Neck supple.  Cardiovascular: Normal rate and regular rhythm.   No murmur heard. Pulmonary/Chest: Effort normal. No stridor. No respiratory distress. He has rhonchi. He exhibits no tenderness.  Abdominal: Soft.  There is no tenderness.  Musculoskeletal: He exhibits no edema.  Neurological: He is alert.  Skin: Skin is warm and dry.  Psychiatric: He has a normal mood and affect.  Nursing note and vitals reviewed.    ED Treatments / Results  Labs (all labs ordered are listed, but only abnormal results are displayed) Labs Reviewed  MRSA PCR SCREENING - Abnormal; Notable for the following:       Result Value   MRSA by PCR POSITIVE (*)    All other components within normal limits  CBC WITH DIFFERENTIAL/PLATELET - Abnormal; Notable for the following:    WBC 11.4 (*)    RBC 4.01 (*)    Hemoglobin 12.0 (*)    HCT 37.2 (*)    Neutro Abs 10.6 (*)    Lymphs Abs 0.4 (*)    All other components within normal limits  BASIC METABOLIC PANEL - Abnormal; Notable for the following:    Glucose, Bld 131 (*)    BUN 29 (*)    Calcium 8.8 (*)    All other components within normal limits  URINALYSIS, ROUTINE W REFLEX MICROSCOPIC - Abnormal; Notable for the following:    Color, Urine AMBER (*)    APPearance CLOUDY (*)    Protein, ur 100 (*)    Leukocytes, UA TRACE (*)    Bacteria, UA MANY (*)    Squamous Epithelial / LPF 0-5 (*)    Crystals PRESENT (*)    All other components within normal limits  CULTURE, BLOOD (ROUTINE X 2)  CULTURE, BLOOD (ROUTINE X 2)  CULTURE, EXPECTORATED SPUTUM-ASSESSMENT  GRAM STAIN  HIV ANTIBODY (ROUTINE TESTING)  STREP PNEUMONIAE URINARY ANTIGEN  I-STAT CG4 LACTIC ACID, ED  I-STAT CG4 LACTIC ACID,  ED    EKG  EKG Interpretation None       Radiology Dg Chest 2 View  Result Date: 11/29/2016 CLINICAL DATA:  Fall at nursing facility. EXAM: CHEST  2 VIEW COMPARISON:  Radiographs of March 13, 2016. FINDINGS: Stable cardiomediastinal silhouette. Atherosclerosis of thoracic aorta is noted. No pneumothorax is noted. Stable scarring is noted in right midlung. No acute pulmonary disease is noted. Old left rib fractures are noted. Severe degenerative changes seen involving both glenohumeral joints. IMPRESSION: Aortic atherosclerosis.  No acute cardiopulmonary abnormality seen. Electronically Signed   By: Marijo Conception, M.D.   On: 11/29/2016 16:08   Dg Forearm Right  Result Date: 11/29/2016 CLINICAL DATA:  [place] at nursing home today EXAM: RIGHT FOREARM - 2 VIEW COMPARISON:  None FINDINGS: Mild osseous demineralization. Minimal degenerative changes at radiocarpal joint. Non fused ossicles adjacent to pisiform. Joint spaces otherwise preserved. No acute fracture, dislocation or bone destruction. IMPRESSION: No acute osseous abnormalities. Electronically Signed   By: Lavonia Dana M.D.   On: 11/29/2016 17:37   Ct Head Wo Contrast  Result Date: 11/29/2016 CLINICAL DATA:  [place] while walking, possible bleed, head laceration EXAM: CT HEAD WITHOUT CONTRAST CT CERVICAL SPINE WITHOUT CONTRAST TECHNIQUE: Multidetector CT imaging of the head and cervical spine was performed following the standard protocol without intravenous contrast. Multiplanar CT image reconstructions of the cervical spine were also generated. COMPARISON:  None. FINDINGS: CT HEAD FINDINGS Brain: No intracranial hemorrhage, mass effect or midline shift. Stable atrophy and chronic white matter disease. No acute cortical infarction. No mass lesion is noted on this unenhanced scan. Vascular: Atherosclerotic calcifications of carotid siphon Skull: No skull fracture is noted. Sinuses/Orbits: No paranasal sinuses air-fluid levels. Other: There is  midline anterior frontal  scalp swelling axial image 25. Clinical correlation is necessary CT CERVICAL SPINE FINDINGS Alignment: Normal alignment. Skull base and vertebrae: No acute fracture or subluxation. Degenerative changes C1-C2 articulation. Moderate anterior spurring at C4-C5 level. Mild anterior spurring at C5-C6 and C6-C7 level. Mild posterior spurring at C6-C7 level. Soft tissues and spinal canal: No prevertebral soft tissue swelling. Spinal canal is patent. Multilevel mild facet degenerative changes are noted. Disc levels:  Mild disc space flattening at C5-C6 and C6-C7 level. Upper chest: There is no pneumothorax in visualized lung apices. IMPRESSION: 1. No acute intracranial abnormality. Stable atrophy and chronic white matter disease. No definite acute cortical infarction. There is scalp swelling midline anterior frontal region. 2. No cervical spine acute fracture or subluxation. Degenerative changes as described above. Electronically Signed   By: Lahoma Crocker M.D.   On: 11/29/2016 16:00   Ct Cervical Spine Wo Contrast  Result Date: 11/29/2016 CLINICAL DATA:  [place] while walking, possible bleed, head laceration EXAM: CT HEAD WITHOUT CONTRAST CT CERVICAL SPINE WITHOUT CONTRAST TECHNIQUE: Multidetector CT imaging of the head and cervical spine was performed following the standard protocol without intravenous contrast. Multiplanar CT image reconstructions of the cervical spine were also generated. COMPARISON:  None. FINDINGS: CT HEAD FINDINGS Brain: No intracranial hemorrhage, mass effect or midline shift. Stable atrophy and chronic white matter disease. No acute cortical infarction. No mass lesion is noted on this unenhanced scan. Vascular: Atherosclerotic calcifications of carotid siphon Skull: No skull fracture is noted. Sinuses/Orbits: No paranasal sinuses air-fluid levels. Other: There is midline anterior frontal scalp swelling axial image 25. Clinical correlation is necessary CT CERVICAL SPINE  FINDINGS Alignment: Normal alignment. Skull base and vertebrae: No acute fracture or subluxation. Degenerative changes C1-C2 articulation. Moderate anterior spurring at C4-C5 level. Mild anterior spurring at C5-C6 and C6-C7 level. Mild posterior spurring at C6-C7 level. Soft tissues and spinal canal: No prevertebral soft tissue swelling. Spinal canal is patent. Multilevel mild facet degenerative changes are noted. Disc levels:  Mild disc space flattening at C5-C6 and C6-C7 level. Upper chest: There is no pneumothorax in visualized lung apices. IMPRESSION: 1. No acute intracranial abnormality. Stable atrophy and chronic white matter disease. No definite acute cortical infarction. There is scalp swelling midline anterior frontal region. 2. No cervical spine acute fracture or subluxation. Degenerative changes as described above. Electronically Signed   By: Lahoma Crocker M.D.   On: 11/29/2016 16:00    Procedures .Marland KitchenLaceration Repair Date/Time: 11/30/2016 1:39 PM Performed by: Courtney Paris Authorized by: Courtney Paris   Consent:    Consent obtained:  Verbal   Consent given by:  Patient   Risks discussed:  Infection, pain, poor cosmetic result, poor wound healing and need for additional repair   Alternatives discussed:  No treatment Anesthesia (see MAR for exact dosages):    Anesthesia method:  Local infiltration   Local anesthetic:  Lidocaine 1% WITH epi Laceration details:    Location:  Face   Face location:  Forehead   Length (cm):  3   Depth (mm):  3 Repair type:    Repair type:  Complex (stellate) Pre-procedure details:    Preparation:  Patient was prepped and draped in usual sterile fashion Exploration:    Limited defect created (wound extended): yes     Hemostasis achieved with:  Direct pressure   Wound exploration: wound explored through full range of motion and entire depth of wound probed and visualized     Wound extent: no muscle damage noted  Contaminated: no     Treatment:    Area cleansed with:  Saline   Amount of cleaning:  Standard   Irrigation solution:  Sterile water   Irrigation volume:  250   Irrigation method:  Syringe   Visualized foreign bodies/material removed: no     Debridement:  None   Undermining:  Minimal   Scar revision: no   Skin repair:    Repair method:  Sutures   Suture size:  5-0   Suture material:  Fast-absorbing gut   Suture technique:  Simple interrupted (corner stitch)   Number of sutures:  5 Approximation:    Approximation:  Close   Vermilion border: well-aligned   Post-procedure details:    Dressing:  Antibiotic ointment   Patient tolerance of procedure:  Tolerated well, no immediate complications .Marland KitchenLaceration Repair Date/Time: 11/30/2016 1:45 PM Performed by: Courtney Paris Authorized by: Courtney Paris   Consent:    Consent obtained:  Verbal   Consent given by:  Patient   Risks discussed:  Infection   Alternatives discussed:  No treatment Anesthesia (see MAR for exact dosages):    Anesthesia method:  None Laceration details:    Location:  Scalp   Scalp location:  Occipital   Length (cm):  7   Depth (mm):  5 Repair type:    Repair type:  Simple Pre-procedure details:    Preparation:  Patient was prepped and draped in usual sterile fashion and imaging obtained to evaluate for foreign bodies Exploration:    Hemostasis achieved with:  Direct pressure   Wound exploration: entire depth of wound probed and visualized     Contaminated: no   Treatment:    Area cleansed with:  Saline   Amount of cleaning:  Extensive   Irrigation solution:  Sterile water and sterile saline   Irrigation volume:  500   Irrigation method:  Syringe   Visualized foreign bodies/material removed: no   Skin repair:    Repair method:  Staples   Number of staples:  8 Approximation:    Approximation:  Close   Vermilion border: well-aligned   Post-procedure details:    Dressing:  Antibiotic ointment    Patient tolerance of procedure:  Tolerated well, no immediate complications   (including critical care time)  Medications Ordered in ED Medications  cefTRIAXone (ROCEPHIN) 1 g in dextrose 5 % 50 mL IVPB (not administered)  azithromycin (ZITHROMAX) tablet 500 mg (500 mg Oral Given 11/30/16 0137)  acetaminophen (TYLENOL) tablet 650 mg (not administered)  amLODipine (NORVASC) tablet 5 mg (5 mg Oral Given 11/30/16 0952)  aspirin chewable tablet 81 mg (81 mg Oral Given 11/30/16 0952)  bisacodyl (DULCOLAX) suppository 10 mg (not administered)  carbidopa-levodopa (SINEMET IR) 25-250 MG per tablet immediate release 1 tablet (1 tablet Oral Given 11/30/16 1319)  clonazePAM (KLONOPIN) tablet 0.25 mg (not administered)  entacapone (COMTAN) tablet 200 mg (200 mg Oral Given 11/30/16 1320)  feeding supplement (GLUCERNA SHAKE) (GLUCERNA SHAKE) liquid 237 mL (237 mLs Oral Given 11/30/16 1320)  ferrous sulfate tablet 325 mg (325 mg Oral Given 11/30/16 0952)  guaiFENesin (ROBITUSSIN) 100 MG/5ML solution 200 mg (200 mg Oral Given 11/30/16 0138)  hydrALAZINE (APRESOLINE) tablet 25 mg (25 mg Oral Given 11/30/16 0953)  isosorbide mononitrate (IMDUR) 24 hr tablet 30 mg (30 mg Oral Given 11/30/16 0952)  clopidogrel (PLAVIX) tablet 75 mg (not administered)  lisinopril (PRINIVIL,ZESTRIL) tablet 5 mg (5 mg Oral Given 11/30/16 0952)  lamoTRIgine (LAMICTAL) tablet 150 mg (150 mg Oral  Given 11/30/16 0952)  predniSONE (DELTASONE) tablet 2.5 mg (2.5 mg Oral Given 11/30/16 0951)  polyethylene glycol (MIRALAX / GLYCOLAX) packet 17 g (17 g Oral Given 11/30/16 0953)  pantoprazole (PROTONIX) EC tablet 40 mg (40 mg Oral Given 11/30/16 0952)  multivitamin with minerals tablet 1 tablet (1 tablet Oral Given 11/30/16 0953)  QUEtiapine (SEROQUEL) tablet 75 mg (75 mg Oral Given 11/30/16 0137)  traZODone (DESYREL) tablet 50 mg (50 mg Oral Given 11/30/16 0138)  sertraline (ZOLOFT) tablet 200 mg (200 mg Oral Given 11/30/16 0952)    rosuvastatin (CRESTOR) tablet 20 mg (20 mg Oral Given 11/30/16 0138)  RESOURCE THICKENUP CLEAR (not administered)  mupirocin ointment (BACTROBAN) 2 % 1 application (1 application Nasal Given 11/30/16 0952)  Chlorhexidine Gluconate Cloth 2 % PADS 6 each (6 each Topical Given 11/30/16 0953)  cefTRIAXone (ROCEPHIN) 1 g in dextrose 5 % 50 mL IVPB (0 g Intravenous Stopped 11/29/16 2231)  lidocaine-EPINEPHrine (XYLOCAINE W/EPI) 2 %-1:100000 (with pres) injection 20 mL (20 mLs Intradermal Given 11/29/16 2129)     Initial Impression / Assessment and Plan / ED Course  I have reviewed the triage vital signs and the nursing notes.  Pertinent labs & imaging results that were available during my care of the patient were reviewed by me and considered in my medical decision making (see chart for details).  Clinical Course     [name] is a 81 y.o. male With a past medical history significant for Parkinson's, diabetes, bipolar disorder, and urinary retention with Foley catheter dependence who presents with a fall and subsequent head injury, productive cough, and change in urine color.  History and exam are seen above.  Patient has two lacerations on had, one on the four head and one in the occiput. Neuro- exam intact. Lungs very coarse breath sounds. Oxygen intermittently in the low 90s to upper 80s. Foley catheter in place with dark urine. No abdominal tenderness. No chest tenderness.  Patient had workup that showed evidence of UTI. CT head and neck showed no acute fracture, malalignment, or to cranial injury. Lacerations repaired as described above. Cervical collar removed.   Patient given antibiotics For UTI. Family requested social work involvement and they were called.  Do not feel patient is safe to go home given infection and falls.  Although x-ray shows no evidence of pneumonia, clinically, there is concern for pneumonia. Given the worsening falls in the setting of UTI and possible  pneumonia, patient admitted for further management.   Final Clinical Impressions(s) / ED Diagnoses   Final diagnoses:  Fall, initial encounter  Injury of head, initial encounter  Lower urinary tract infectious disease  Laceration of scalp, initial encounter     Clinical Impression: 1. Fall, initial encounter   2. Injury of head, initial encounter   3. Lower urinary tract infectious disease   4. Laceration of scalp, initial encounter     Disposition: Admit to Hospitalist service    Courtney Paris, MD 11/30/16 1352

## 2016-11-29 NOTE — H&P (Signed)
History and Physical    [name] [id number] DOB: 12/30/1934 DOA: 11/29/2016   PCP: Jilda Panda, MD Chief Complaint:  Chief Complaint  Patient presents with  . Fall    HPI: [name] is a 81 y.o. male with medical history significant of Parkinson's disease, chronic foley with recurrent UTIs, HTN.  Patient had mechanical fall as he was standing up in bathroom at [facility] today.  Hit head on floor, hematoma to forehead.  Also had fall last evening without injury.  Patient brought in from NH due to recent increased falls.  Patient doesn't remember falls from yesterday despite report from NH.  Sounds like there is either some delirium or dementia at play as well: Year is "2016" correctly gets that it is Dec, but today is "Sunday".  ED Course: No skull fx, no brain bleed.  Has productive cough that can be heard from across the ED, cough productive of purulent sputum.  Denies SOB.  CXR neg for PNA.  UA equivocal for UTI.  WBC 11.1k.  Forehead lac repaired.  Cough onset per patient "a couple of weeks ago" but again im not so sure he is entirely with it at the moment either due to dementia, delirium, or post concussion from hitting his head.  Review of Systems: As per HPI otherwise 10 point review of systems negative.    Past Medical History:  Diagnosis Date  . Anxiety   . Arthritis   . Benign prostate hyperplasia   . Bipolar disorder ([place])   . C2 cervical fracture ([name])    due to pt fall  . Chronic indwelling Foley catheter   . Depression   . Diabetes mellitus type II   . Falls   . Flu   . Hearing aid worn   . Hypertension   . Memory difficulty 08/21/2014  . Memory loss   . MRSA infection (methicillin-resistant Staphylococcus aureus)   . Neuromuscular disorder ([place])    parkinsons  . Parkinson disease ([place])   . Pneumonia   . SIRS (systemic inflammatory response syndrome) ([name])   . UTI (urinary tract infection)     Past Surgical History:  Procedure Laterality Date  .  TOTAL HIP ARTHROPLASTY    . VIDEO ASSISTED THORACOSCOPY (VATS)/EMPYEMA  12/16/2012   Procedure: VIDEO ASSISTED THORACOSCOPY (VATS)/EMPYEMA;  Surgeon: Melrose Nakayama, MD;  Location: [facility];  Service: Thoracic;  Laterality: Right;  Marland Kitchen VIDEO BRONCHOSCOPY  12/16/2012   Procedure: VIDEO BRONCHOSCOPY;  Surgeon: Melrose Nakayama, MD;  Location: [place];  Service: Thoracic;  Laterality: Right;     reports that he has never smoked. He has never used smokeless tobacco. He reports that he does not drink alcohol or use drugs.  Allergies  Allergen Reactions  . Plant Sterols And Stanols Other (See Comments)    ON MAR    Family History  Problem Relation Age of Onset  . Depression Father   . Cancer Mother   . Heart disease Mother       Prior to Admission medications   Medication Sig Start Date End Date Taking? Authorizing Provider  acetaminophen (TYLENOL) 325 MG tablet Take 650 mg by mouth every 6 (six) hours as needed for mild pain.   Yes Historical Provider, MD  amLODipine (NORVASC) 5 MG tablet Take 5 mg by mouth daily.   Yes Historical Provider, MD  aspirin 81 MG chewable tablet Chew 81 mg by mouth daily.   Yes Historical Provider, MD  bisacodyl (DULCOLAX) 10 MG suppository  Place 10 mg rectally as needed for moderate constipation.   Yes Historical Provider, MD  carbidopa-levodopa (SINEMET IR) 25-250 MG per tablet Take 1 tablet by mouth 3 (three) times daily.   Yes Historical Provider, MD  clonazePAM (KLONOPIN) 0.5 MG tablet Take 0.5 tablets (0.25 mg total) by mouth 2 (two) times daily as needed for anxiety. 08/31/16  Yes Kathlee Nations, MD  clopidogrel (PLAVIX) 75 MG tablet Take 1 tablet (75 mg total) by mouth daily. Patient taking differently: Take 75 mg by mouth every evening.  10/07/15  Yes Florencia Reasons, MD  entacapone (COMTAN) 200 MG tablet Take 200 mg by mouth 3 (three) times daily.   Yes Historical Provider, MD  feeding supplement, GLUCERNA SHAKE, (GLUCERNA SHAKE) LIQD Take 237 mLs by mouth  2 (two) times daily between meals. 03/16/16  Yes Kelvin Cellar, MD  ferrous sulfate 325 (65 FE) MG tablet Take 325 mg by mouth 2 (two) times daily.   Yes Historical Provider, MD  hydrALAZINE (APRESOLINE) 25 MG tablet Take 25 mg by mouth 2 (two) times daily.    Yes Historical Provider, MD  isosorbide mononitrate (IMDUR) 30 MG 24 hr tablet Take 1 tablet (30 mg total) by mouth daily. 10/07/15  Yes Florencia Reasons, MD  ketoconazole (NIZORAL) 2 % cream Apply 1 application topically daily. 10/23/16  Yes Trula Slade, DPM  lamoTRIgine (LAMICTAL) 150 MG tablet Take 1 tablet (150 mg total) by mouth 2 (two) times daily. 08/31/16  Yes Kathlee Nations, MD  lisinopril (PRINIVIL,ZESTRIL) 10 MG tablet Take 0.5 tablets (5 mg total) by mouth daily. 10/07/15  Yes Florencia Reasons, MD  loratadine (CLARITIN) 10 MG tablet Take 10 mg by mouth daily as needed for allergies.   Yes Historical Provider, MD  Maltodextrin-Xanthan Gum (RESOURCE THICKENUP CLEAR) POWD Take 120 g by mouth as needed. Patient taking differently: Take 1 Can by mouth daily as needed (takes 120mg  as needed food thickener).  10/07/15  Yes Florencia Reasons, MD  Multiple Vitamin (MULTIVITAMIN WITH MINERALS) TABS tablet Take 1 tablet by mouth daily.   Yes Historical Provider, MD  pantoprazole (PROTONIX) 40 MG tablet Take 40 mg by mouth daily. Reported on 07/06/2016   Yes Historical Provider, MD  polyethylene glycol (MIRALAX / GLYCOLAX) packet Take 17 g by mouth daily.   Yes Historical Provider, MD  predniSONE (DELTASONE) 2.5 MG tablet Take 2.5 mg by mouth daily with breakfast.   Yes Historical Provider, MD  QUEtiapine (SEROQUEL) 25 MG tablet Take 3 tablets (75 mg total) by mouth at bedtime. 08/31/16  Yes Kathlee Nations, MD  rosuvastatin (CRESTOR) 20 MG tablet Take 20 mg by mouth daily.   Yes Historical Provider, MD  sertraline (ZOLOFT) 100 MG tablet Take 2 tablets (200 mg total) by mouth daily. 08/31/16  Yes Kathlee Nations, MD  Skin Protectants, Misc. (BAZA PROTECT EX) Apply 1  application topically 2 (two) times daily.    Yes Historical Provider, MD  traZODone (DESYREL) 50 MG tablet Take 1 tablet (50 mg total) by mouth at bedtime. 08/31/16  Yes Kathlee Nations, MD  guaifenesin (ROBITUSSIN) 100 MG/5ML syrup Take 200 mg by mouth 3 (three) times daily as needed for cough.    Historical Provider, MD    Physical Exam: Vitals:   11/29/16 2100 11/29/16 2115 11/29/16 2130 11/29/16 2313  BP: 146/72 140/74 147/73   Pulse:      Resp: 20 (!) 28 (!) 27   Temp:    98.6 F (37 C)  TempSrc:  Oral  SpO2:      Weight:      Height:          Constitutional: NAD, calm, comfortable Eyes: PERRL, lids and conjunctivae normal ENMT: Mucous membranes are moist. Posterior pharynx clear of any exudate or lesions.Normal dentition.  Neck: normal, supple, no masses, no thyromegaly Respiratory: Junky sounding cough that can be heard from across the ED. Cardiovascular: Regular rate and rhythm, no murmurs / rubs / gallops. No extremity edema. 2+ pedal pulses. No carotid bruits.  Abdomen: no tenderness, no masses palpated. No hepatosplenomegaly. Bowel sounds positive.  Musculoskeletal: no clubbing / cyanosis. No joint deformity upper and lower extremities. Good ROM, no contractures. Normal muscle tone.  Skin: Forehead hematoma and lac Neurologic: CN 2-12 grossly intact. Sensation intact, DTR normal. Strength 5/5 in all 4.  Psychiatric: Normal judgment and insight. Somewhat disoriented to time as noted in HPI.  Normal mood.    Labs on Admission: I have personally reviewed following labs and imaging studies  CBC:  Recent Labs Lab 11/29/16 1712  WBC 11.4*  NEUTROABS 10.6*  HGB 12.0*  HCT 37.2*  MCV 92.8  PLT [id number]   Basic Metabolic Panel:  Recent Labs Lab 11/29/16 1712  NA 141  K 4.0  CL 107  CO2 24  GLUCOSE 131*  BUN 29*  CREATININE 1.08  CALCIUM 8.8*   GFR: Estimated Creatinine Clearance: 56.6 mL/min (by C-G formula based on SCr of 1.08 mg/dL). Liver Function  Tests: No results for input(s): AST, ALT, ALKPHOS, BILITOT, PROT, ALBUMIN in the last 168 hours. No results for input(s): LIPASE, AMYLASE in the last 168 hours. No results for input(s): AMMONIA in the last 168 hours. Coagulation Profile: No results for input(s): INR, PROTIME in the last 168 hours. Cardiac Enzymes: No results for input(s): CKTOTAL, CKMB, CKMBINDEX, TROPONINI in the last 168 hours. BNP (last 3 results) No results for input(s): PROBNP in the last 8760 hours. HbA1C: No results for input(s): HGBA1C in the last 72 hours. CBG: No results for input(s): GLUCAP in the last 168 hours. Lipid Profile: No results for input(s): CHOL, HDL, LDLCALC, TRIG, CHOLHDL, LDLDIRECT in the last 72 hours. Thyroid Function Tests: No results for input(s): TSH, T4TOTAL, FREET4, T3FREE, THYROIDAB in the last 72 hours. Anemia Panel: No results for input(s): VITAMINB12, FOLATE, FERRITIN, TIBC, IRON, RETICCTPCT in the last 72 hours. Urine analysis:    Component Value Date/Time   COLORURINE AMBER (A) 11/29/2016 1805   APPEARANCEUR CLOUDY (A) 11/29/2016 1805   LABSPEC 1.020 11/29/2016 1805   PHURINE 8.0 11/29/2016 1805   GLUCOSEU NEGATIVE 11/29/2016 1805   HGBUR NEGATIVE 11/29/2016 1805   BILIRUBINUR NEGATIVE 11/29/2016 1805   KETONESUR NEGATIVE 11/29/2016 1805   PROTEINUR 100 (A) 11/29/2016 1805   UROBILINOGEN 0.2 10/05/2015 1555   NITRITE NEGATIVE 11/29/2016 1805   LEUKOCYTESUR TRACE (A) 11/29/2016 1805   Sepsis Labs: @LABRCNTIP (procalcitonin:4,lacticidven:4) )No results found for this or any previous visit (from the past 240 hour(s)).   Radiological Exams on Admission: Dg Chest 2 View  Result Date: 11/29/2016 CLINICAL DATA:  Fall at nursing facility. EXAM: CHEST  2 VIEW COMPARISON:  Radiographs of March 13, 2016. FINDINGS: Stable cardiomediastinal silhouette. Atherosclerosis of thoracic aorta is noted. No pneumothorax is noted. Stable scarring is noted in right midlung. No acute pulmonary  disease is noted. Old left rib fractures are noted. Severe degenerative changes seen involving both glenohumeral joints. IMPRESSION: Aortic atherosclerosis.  No acute cardiopulmonary abnormality seen. Electronically Signed   By: Sabino Dick  Brooke Bonito, M.D.   On: 11/29/2016 16:08   Dg Forearm Right  Result Date: 11/29/2016 CLINICAL DATA:  [place] at nursing home today EXAM: RIGHT FOREARM - 2 VIEW COMPARISON:  None FINDINGS: Mild osseous demineralization. Minimal degenerative changes at radiocarpal joint. Non fused ossicles adjacent to pisiform. Joint spaces otherwise preserved. No acute fracture, dislocation or bone destruction. IMPRESSION: No acute osseous abnormalities. Electronically Signed   By: Lavonia Dana M.D.   On: 11/29/2016 17:37   Ct Head Wo Contrast  Result Date: 11/29/2016 CLINICAL DATA:  [place] while walking, possible bleed, head laceration EXAM: CT HEAD WITHOUT CONTRAST CT CERVICAL SPINE WITHOUT CONTRAST TECHNIQUE: Multidetector CT imaging of the head and cervical spine was performed following the standard protocol without intravenous contrast. Multiplanar CT image reconstructions of the cervical spine were also generated. COMPARISON:  None. FINDINGS: CT HEAD FINDINGS Brain: No intracranial hemorrhage, mass effect or midline shift. Stable atrophy and chronic white matter disease. No acute cortical infarction. No mass lesion is noted on this unenhanced scan. Vascular: Atherosclerotic calcifications of carotid siphon Skull: No skull fracture is noted. Sinuses/Orbits: No paranasal sinuses air-fluid levels. Other: There is midline anterior frontal scalp swelling axial image 25. Clinical correlation is necessary CT CERVICAL SPINE FINDINGS Alignment: Normal alignment. Skull base and vertebrae: No acute fracture or subluxation. Degenerative changes C1-C2 articulation. Moderate anterior spurring at C4-C5 level. Mild anterior spurring at C5-C6 and C6-C7 level. Mild posterior spurring at C6-C7 level. Soft tissues  and spinal canal: No prevertebral soft tissue swelling. Spinal canal is patent. Multilevel mild facet degenerative changes are noted. Disc levels:  Mild disc space flattening at C5-C6 and C6-C7 level. Upper chest: There is no pneumothorax in visualized lung apices. IMPRESSION: 1. No acute intracranial abnormality. Stable atrophy and chronic white matter disease. No definite acute cortical infarction. There is scalp swelling midline anterior frontal region. 2. No cervical spine acute fracture or subluxation. Degenerative changes as described above. Electronically Signed   By: Lahoma Crocker M.D.   On: 11/29/2016 16:00   Ct Cervical Spine Wo Contrast  Result Date: 11/29/2016 CLINICAL DATA:  [place] while walking, possible bleed, head laceration EXAM: CT HEAD WITHOUT CONTRAST CT CERVICAL SPINE WITHOUT CONTRAST TECHNIQUE: Multidetector CT imaging of the head and cervical spine was performed following the standard protocol without intravenous contrast. Multiplanar CT image reconstructions of the cervical spine were also generated. COMPARISON:  None. FINDINGS: CT HEAD FINDINGS Brain: No intracranial hemorrhage, mass effect or midline shift. Stable atrophy and chronic white matter disease. No acute cortical infarction. No mass lesion is noted on this unenhanced scan. Vascular: Atherosclerotic calcifications of carotid siphon Skull: No skull fracture is noted. Sinuses/Orbits: No paranasal sinuses air-fluid levels. Other: There is midline anterior frontal scalp swelling axial image 25. Clinical correlation is necessary CT CERVICAL SPINE FINDINGS Alignment: Normal alignment. Skull base and vertebrae: No acute fracture or subluxation. Degenerative changes C1-C2 articulation. Moderate anterior spurring at C4-C5 level. Mild anterior spurring at C5-C6 and C6-C7 level. Mild posterior spurring at C6-C7 level. Soft tissues and spinal canal: No prevertebral soft tissue swelling. Spinal canal is patent. Multilevel mild facet  degenerative changes are noted. Disc levels:  Mild disc space flattening at C5-C6 and C6-C7 level. Upper chest: There is no pneumothorax in visualized lung apices. IMPRESSION: 1. No acute intracranial abnormality. Stable atrophy and chronic white matter disease. No definite acute cortical infarction. There is scalp swelling midline anterior frontal region. 2. No cervical spine acute fracture or subluxation. Degenerative changes as described above. Electronically Signed  By: Lahoma Crocker M.D.   On: 11/29/2016 16:00    EKG: Independently reviewed.  Assessment/Plan Principal Problem:   CAP (community acquired pneumonia) Active Problems:   Hypertension   Fall   Dementia without behavioral disturbance    1. CAP - although CXR is negative, clinically think that patients increased falls recently, mild WBC, productive cough, etc. Represent CAP and will treat as such 1. PNA pathway 2. Cultures pending 3. UCx also pending 4. Rocephin and azithromycin empirically 2. HTN - continue home meds 3. Dementia - continue home meds, mildly disoriented today, unclear if this is baseline, worse due to infection, or worse due to concussion from fall. 4. Fell and hit head - 1. Getting sutures put in 2. No CT evidence of fracture or ICH 3. Will hold plavix for 1 night   DVT prophylaxis: SCDs only due to trauma, also holding plavix overnight Code Status: DNR Family Communication: No family in room Consults called: None Admission status: Admit to inpatient   Etta Quill DO [facility] Pager [phone] from 7PM-7AM  If 7AM-7PM, please contact the day physician for the patient [email or web address] Password [email or web address]  11/29/2016, 11:21 PM

## 2016-11-29 NOTE — ED Notes (Signed)
Admitting MD at the bedside.  

## 2016-11-29 NOTE — ED Triage Notes (Signed)
Per [facility] pt was in bathroom at nursing home. As he stood he began to fall to the ground. He hit his head on the floor, 2 hematomas noted on the front top of forehead, and one on the top back of his head. Pt is on Plavix. Pt also fell last night without injury. LSN at 12:00noon VS are as follows: BP: 154/78 HR: 94 Resp: 16 CBG :189 Productive cough (yellow) 2 L SPO2: 94%

## 2016-11-29 NOTE — ED Provider Notes (Signed)
MSE was initiated and I personally evaluated the patient and placed orders (if any) at  3:24 PM on November 29, 2016.  The patient appears stable so that the remainder of the MSE may be completed by another provider.    Merrily Pew, MD 11/30/16 [phone]

## 2016-11-30 ENCOUNTER — Ambulatory Visit (HOSPITAL_COMMUNITY): Payer: Self-pay | Admitting: Psychiatry

## 2016-11-30 ENCOUNTER — Encounter (HOSPITAL_COMMUNITY): Payer: Self-pay | Admitting: General Practice

## 2016-11-30 DIAGNOSIS — I10 Essential (primary) hypertension: Secondary | ICD-10-CM

## 2016-11-30 DIAGNOSIS — F0390 Unspecified dementia without behavioral disturbance: Secondary | ICD-10-CM

## 2016-11-30 DIAGNOSIS — J189 Pneumonia, unspecified organism: Principal | ICD-10-CM

## 2016-11-30 LAB — HIV ANTIBODY (ROUTINE TESTING W REFLEX): HIV SCREEN 4TH GENERATION: NONREACTIVE

## 2016-11-30 LAB — STREP PNEUMONIAE URINARY ANTIGEN: Strep Pneumo Urinary Antigen: NEGATIVE

## 2016-11-30 LAB — MRSA PCR SCREENING: MRSA by PCR: POSITIVE — AB

## 2016-11-30 MED ORDER — CHLORHEXIDINE GLUCONATE CLOTH 2 % EX PADS
6.0000 | MEDICATED_PAD | Freq: Every day | CUTANEOUS | Status: DC
  Administered 2016-11-30 – 2016-12-02 (×2): 6 via TOPICAL

## 2016-11-30 MED ORDER — RESOURCE THICKENUP CLEAR PO POWD
ORAL | Status: DC | PRN
  Filled 2016-11-30: qty 125

## 2016-11-30 MED ORDER — MUPIROCIN 2 % EX OINT
1.0000 "application " | TOPICAL_OINTMENT | Freq: Two times a day (BID) | CUTANEOUS | Status: DC
  Administered 2016-11-30 – 2016-12-02 (×5): 1 via NASAL
  Filled 2016-11-30 (×2): qty 22

## 2016-11-30 NOTE — Progress Notes (Signed)
PROGRESS NOTE [facility]   TONIA CLOWE   [id number] DOB: 01-07-1935  DOA: 11/29/2016 PCP: Jilda Panda, MD   Brief Narrative:  81 year old male with significant history of Parkinson disease, indwelling chronic Foley catheter with recommended UTIs, hypertension. Presented to the ED after mechanical fall while he was standing in the bathroom. Hitting his head causing a hematoma of the forehead. Upon evaluation in the ER patient presented with purulent productive cough and elevated white count. Patient was admitted for community-acquired pneumonia started on IV antibiotics. Forehead laceration was repaired in the ED.   Subjective: Patient seen and examined at bedside. Patient reports no complaints beside headaches. Patient has a suction tube is been used infrequently for his cough. Patient seems to be confused alert to person only. During my exam patient is coughing frequently.  Assessment & Plan: Community-acquired pneumonia - initial checks x-ray was negative for infectious process, although clinically suggesting pneumonia. Influenza and strep antigen negative Continue antibiotics Follow up cultures Will repeat chest x-ray after hydration.  Hypertension - stable Continue home meds  Dementia - unclear baseline, although could be some form of concussion from fall or worsening due to infection. Monitor Patient at high risk of sundowning will add Seroquel when necessary  MRSA positive colonization  Treat bacitracin Keep in contact isolation  DVT prophylaxis: SCD  Code Status: DNR Family Communication: None at bedside Disposition Plan: Discharge home when    Consultants:   None  Procedures:   None   Antimicrobials:  Rocephin   Azitho   Objective: Vitals:   11/30/16 0649 11/30/16 0700 11/30/16 0951 11/30/16 1243  BP: (!) 121/58  (!) 155/61 (!) 117/54  Pulse: 63   67  Resp: 18   18  Temp: 97.4 F (36.3 C)   98 F (36.7 C)  TempSrc: Oral   Oral    SpO2: (!) 78% 94%  96%  Weight:      Height:        Intake/Output Summary (Last 24 hours) at 11/30/16 1651 Last data filed at 11/30/16 0548  Gross per 24 hour  Intake              100 ml  Output              600 ml  Net             -500 ml   Filed Weights   11/29/16 1504 11/30/16 0047  Weight: 72.1 kg (159 lb) 71.3 kg (157 lb 1.6 oz)    Examination:  General exam: Appears calm and comfortable  Respiratory system: Junky cough, diffuse rhonchi, no wheezing, air entry diminish  Cardiovascular system: S1 & S2 heard, RRR. No JVD, murmurs, rubs or gallops Gastrointestinal system: Soft NT ND  Central nervous system: Alert, not oriented to place or time  Extremities: No pedal edema.    Skin: No rashes, lesions or ulcers, Head hematoma.   Data Reviewed: I have personally reviewed following labs and imaging studies  CBC:  Recent Labs Lab 11/29/16 1712  WBC 11.4*  NEUTROABS 10.6*  HGB 12.0*  HCT 37.2*  MCV 92.8  PLT [id number]   Basic Metabolic Panel:  Recent Labs Lab 11/29/16 1712  NA 141  K 4.0  CL 107  CO2 24  GLUCOSE 131*  BUN 29*  CREATININE 1.08  CALCIUM 8.8*   GFR: Estimated Creatinine Clearance: 55.9 mL/min (by C-G formula based on SCr of 1.08 mg/dL). Liver Function Tests: No results for input(s): AST, ALT, ALKPHOS, BILITOT,  PROT, ALBUMIN in the last 168 hours. No results for input(s): LIPASE, AMYLASE in the last 168 hours. No results for input(s): AMMONIA in the last 168 hours. Coagulation Profile: No results for input(s): INR, PROTIME in the last 168 hours. Cardiac Enzymes: No results for input(s): CKTOTAL, CKMB, CKMBINDEX, TROPONINI in the last 168 hours. BNP (last 3 results) No results for input(s): PROBNP in the last 8760 hours. HbA1C: No results for input(s): HGBA1C in the last 72 hours. CBG: No results for input(s): GLUCAP in the last 168 hours. Lipid Profile: No results for input(s): CHOL, HDL, LDLCALC, TRIG, CHOLHDL, LDLDIRECT in the last 72  hours. Thyroid Function Tests: No results for input(s): TSH, T4TOTAL, FREET4, T3FREE, THYROIDAB in the last 72 hours. Anemia Panel: No results for input(s): VITAMINB12, FOLATE, FERRITIN, TIBC, IRON, RETICCTPCT in the last 72 hours. Sepsis Labs:  Recent Labs Lab 11/29/16 1725  LATICACIDVEN 0.90    Recent Results (from the past 240 hour(s))  MRSA PCR Screening     Status: Abnormal   Collection Time: 11/30/16  1:14 AM  Result Value Ref Range Status   MRSA by PCR POSITIVE (A) NEGATIVE Final    Comment:        The GeneXpert MRSA Assay (FDA approved for NASAL specimens only), is one component of a comprehensive MRSA colonization surveillance program. It is not intended to diagnose MRSA infection nor to guide or monitor treatment for MRSA infections. RESULT CALLED TO, READ BACK BY AND VERIFIED WITHMadelon Lips RN [phone] 11/30/16 A BROWNING      Radiology Studies: Dg Chest 2 View  Result Date: 11/29/2016 CLINICAL DATA:  Fall at nursing facility. EXAM: CHEST  2 VIEW COMPARISON:  Radiographs of March 13, 2016. FINDINGS: Stable cardiomediastinal silhouette. Atherosclerosis of thoracic aorta is noted. No pneumothorax is noted. Stable scarring is noted in right midlung. No acute pulmonary disease is noted. Old left rib fractures are noted. Severe degenerative changes seen involving both glenohumeral joints. IMPRESSION: Aortic atherosclerosis.  No acute cardiopulmonary abnormality seen. Electronically Signed   By: Marijo Conception, M.D.   On: 11/29/2016 16:08   Dg Forearm Right  Result Date: 11/29/2016 CLINICAL DATA:  [place] at nursing home today EXAM: RIGHT FOREARM - 2 VIEW COMPARISON:  None FINDINGS: Mild osseous demineralization. Minimal degenerative changes at radiocarpal joint. Non fused ossicles adjacent to pisiform. Joint spaces otherwise preserved. No acute fracture, dislocation or bone destruction. IMPRESSION: No acute osseous abnormalities. Electronically Signed   By: Lavonia Dana M.D.    On: 11/29/2016 17:37   Ct Head Wo Contrast  Result Date: 11/29/2016 CLINICAL DATA:  [place] while walking, possible bleed, head laceration EXAM: CT HEAD WITHOUT CONTRAST CT CERVICAL SPINE WITHOUT CONTRAST TECHNIQUE: Multidetector CT imaging of the head and cervical spine was performed following the standard protocol without intravenous contrast. Multiplanar CT image reconstructions of the cervical spine were also generated. COMPARISON:  None. FINDINGS: CT HEAD FINDINGS Brain: No intracranial hemorrhage, mass effect or midline shift. Stable atrophy and chronic white matter disease. No acute cortical infarction. No mass lesion is noted on this unenhanced scan. Vascular: Atherosclerotic calcifications of carotid siphon Skull: No skull fracture is noted. Sinuses/Orbits: No paranasal sinuses air-fluid levels. Other: There is midline anterior frontal scalp swelling axial image 25. Clinical correlation is necessary CT CERVICAL SPINE FINDINGS Alignment: Normal alignment. Skull base and vertebrae: No acute fracture or subluxation. Degenerative changes C1-C2 articulation. Moderate anterior spurring at C4-C5 level. Mild anterior spurring at C5-C6 and C6-C7 level. Mild posterior spurring  at C6-C7 level. Soft tissues and spinal canal: No prevertebral soft tissue swelling. Spinal canal is patent. Multilevel mild facet degenerative changes are noted. Disc levels:  Mild disc space flattening at C5-C6 and C6-C7 level. Upper chest: There is no pneumothorax in visualized lung apices. IMPRESSION: 1. No acute intracranial abnormality. Stable atrophy and chronic white matter disease. No definite acute cortical infarction. There is scalp swelling midline anterior frontal region. 2. No cervical spine acute fracture or subluxation. Degenerative changes as described above. Electronically Signed   By: Lahoma Crocker M.D.   On: 11/29/2016 16:00   Ct Cervical Spine Wo Contrast  Result Date: 11/29/2016 CLINICAL DATA:  [place] while walking,  possible bleed, head laceration EXAM: CT HEAD WITHOUT CONTRAST CT CERVICAL SPINE WITHOUT CONTRAST TECHNIQUE: Multidetector CT imaging of the head and cervical spine was performed following the standard protocol without intravenous contrast. Multiplanar CT image reconstructions of the cervical spine were also generated. COMPARISON:  None. FINDINGS: CT HEAD FINDINGS Brain: No intracranial hemorrhage, mass effect or midline shift. Stable atrophy and chronic white matter disease. No acute cortical infarction. No mass lesion is noted on this unenhanced scan. Vascular: Atherosclerotic calcifications of carotid siphon Skull: No skull fracture is noted. Sinuses/Orbits: No paranasal sinuses air-fluid levels. Other: There is midline anterior frontal scalp swelling axial image 25. Clinical correlation is necessary CT CERVICAL SPINE FINDINGS Alignment: Normal alignment. Skull base and vertebrae: No acute fracture or subluxation. Degenerative changes C1-C2 articulation. Moderate anterior spurring at C4-C5 level. Mild anterior spurring at C5-C6 and C6-C7 level. Mild posterior spurring at C6-C7 level. Soft tissues and spinal canal: No prevertebral soft tissue swelling. Spinal canal is patent. Multilevel mild facet degenerative changes are noted. Disc levels:  Mild disc space flattening at C5-C6 and C6-C7 level. Upper chest: There is no pneumothorax in visualized lung apices. IMPRESSION: 1. No acute intracranial abnormality. Stable atrophy and chronic white matter disease. No definite acute cortical infarction. There is scalp swelling midline anterior frontal region. 2. No cervical spine acute fracture or subluxation. Degenerative changes as described above. Electronically Signed   By: Lahoma Crocker M.D.   On: 11/29/2016 16:00    Scheduled Meds: . amLODipine  5 mg Oral Daily  . aspirin  81 mg Oral Daily  . azithromycin  500 mg Oral Q24H  . carbidopa-levodopa  1 tablet Oral TID  . cefTRIAXone (ROCEPHIN)  IV  1 g Intravenous  Q24H  . Chlorhexidine Gluconate Cloth  6 each Topical Q0600  . clopidogrel  75 mg Oral QPM  . entacapone  200 mg Oral TID  . feeding supplement (GLUCERNA SHAKE)  237 mL Oral BID BM  . ferrous sulfate  325 mg Oral BID WC  . hydrALAZINE  25 mg Oral BID  . isosorbide mononitrate  30 mg Oral Daily  . lamoTRIgine  150 mg Oral BID  . lisinopril  5 mg Oral Daily  . multivitamin with minerals  1 tablet Oral Daily  . mupirocin ointment  1 application Nasal BID  . pantoprazole  40 mg Oral Daily  . polyethylene glycol  17 g Oral Daily  . predniSONE  2.5 mg Oral Q breakfast  . QUEtiapine  75 mg Oral QHS  . rosuvastatin  20 mg Oral q1800  . sertraline  200 mg Oral Daily  . traZODone  50 mg Oral QHS   Continuous Infusions:   LOS: 1 day    Chipper Oman, MD [facility] Pager [phone]  If 7PM-7AM, please contact night-coverage [email or web address] Password [email or web address]  11/30/2016, 4:51 PM

## 2016-12-01 ENCOUNTER — Inpatient Hospital Stay (HOSPITAL_COMMUNITY): Payer: Medicare Other

## 2016-12-01 NOTE — Clinical Social Work Note (Signed)
Clinical Social Work Assessment  Patient Details  Name: [name] MRN: [id number] Date of Birth: Apr 28, 1935  Date of referral:  12/01/16               Reason for consult:  Discharge Planning                Permission sought to share information with:  Facility Sport and exercise psychologist, Family Supports Permission granted to share information::  Yes, Verbal Permission Granted  Name::     Music therapist::  [name]   Relationship::  Administrator, arts Information:     Housing/Transportation Living arrangements for the past 2 months:  [place] of Information:  Power of Attorney Patient Interpreter Needed:  None Criminal Activity/Legal Involvement Pertinent to Current Situation/Hospitalization:  No - Comment as needed Significant Relationships:  Other(Comment) Lives with:  Facility Resident Do you feel safe going back to the place where you live?  Yes Need for family participation in patient care:  Yes (Comment)  Care giving concerns: CSW received consult regarding discharge planning. Patient is currently disoriented. CSW spoke with patient's HCPOA, [name]. Patient is a resident at [facility]. [name] expressed concerns over patient falling so much at the facility, but she would like him to return there at discharge. CSW to continue to follow for needs.     Social Worker assessment / plan:  Pt to return to [facility] at discharge.   Employment status:  Retired Forensic scientist:  Medicare PT Recommendations:  Not assessed at this time [place] / Referral to community resources:     Patient/Family's Response to care:  [name] expressed concern over patient's current state and hopes that he will improve.   Patient/Family's Understanding of and Emotional Response to Diagnosis, Current Treatment, and Prognosis:  Patient expressed understanding of CSW role and discharge process. No questions/concerns about plan or treatment.    Emotional  Assessment Appearance:  Appears stated age Attitude/Demeanor/Rapport:  Unable to Assess Affect (typically observed):  Unable to Assess Orientation:  Oriented to Self, Oriented to Place, Oriented to Situation Alcohol / Substance use:  Not Applicable Psych involvement (Current and /or in the community):  No (Comment)  Discharge Needs  Concerns to be addressed:  Care Coordination Readmission within the last 30 days:  No Current discharge risk:  None Barriers to Discharge:  Continued Medical Work up   [employer], [place] 12/01/2016, 4:45 PM

## 2016-12-01 NOTE — Progress Notes (Signed)
PROGRESS NOTE [facility]   [name]   [id number] DOB: [birth year]  DOA: 11/29/2016 PCP: Jilda Panda, MD   Brief Narrative:  81 year old male with significant history of Parkinson disease, indwelling chronic Foley catheter with recommended UTIs, hypertension. Presented to the ED after mechanical fall while he was standing in the bathroom. Hitting his head causing a hematoma of the forehead. Upon evaluation in the ER patient presented with purulent productive cough and elevated white count. Patient was admitted for community-acquired pneumonia started on IV antibiotics. Forehead laceration was repaired in the ED.   Subjective: Patient seen and examined. Doing well, sitting up eating lunch, continues to be confused probably at baseline.  Assessment & Plan: Community-acquired pneumonia - initial checks x-ray was negative for infectious process, although clinically suggesting pneumonia. Influenza and strep antigen negative Continue antibiotics Follow up cultures so far no growth Repeat checks x-ray Check procalcitonin  Hypertension - stable Continue home meds  Dementia - unclear baseline, although could be some form of concussion from fall or worsening due to infection. Monitor Patient at high risk of sundowning have seroquel at bedtime   MRSA positive colonization  Treat bacitracin Keep in contact isolation  DVT prophylaxis: SCD  Code Status: DNR Family Communication: None at bedside Disposition Plan: Discharge back to previous environment poss 24-48 hrs   Consultants:   None  Procedures:   None   Antimicrobials:  Rocephin   Azitho   Objective: Vitals:   11/30/16 1243 11/30/16 2132 12/01/16 0536 12/01/16 1255  BP: (!) 117/54 117/61 137/67 (!) 104/54  Pulse: 67 63 72 64  Resp: 18 16 18 17   Temp: 98 F (36.7 C) 98.3 F (36.8 C) 98.6 F (37 C) 98 F (36.7 C)  TempSrc: Oral Oral Oral Oral  SpO2: 96% 98% 97% 92%  Weight:      Height:          Intake/Output Summary (Last 24 hours) at 12/01/16 1637 Last data filed at 12/01/16 1457  Gross per 24 hour  Intake             1170 ml  Output              400 ml  Net              770 ml   Filed Weights   11/29/16 1504 11/30/16 0047  Weight: 72.1 kg (159 lb) 71.3 kg (157 lb 1.6 oz)    Examination:  General exam: Confuse, NAD  Respiratory system: Good air entry, diffuse rhonchi Cardiovascular system: RRR S1S2 no murmur  Central nervous system: Alert, not oriented to time or place  Extremities: No pedal edema.    Skin: No rashes, lesions or ulcers, Head hematoma.   Data Reviewed: I have personally reviewed following labs and imaging studies  CBC:  Recent Labs Lab 11/29/16 1712  WBC 11.4*  NEUTROABS 10.6*  HGB 12.0*  HCT 37.2*  MCV 92.8  PLT XX123456   Basic Metabolic Panel:  Recent Labs Lab 11/29/16 1712  NA 141  K 4.0  CL 107  CO2 24  GLUCOSE 131*  BUN 29*  CREATININE 1.08  CALCIUM 8.8*   GFR: Estimated Creatinine Clearance: 55.9 mL/min (by C-G formula based on SCr of 1.08 mg/dL). Liver Function Tests: No results for input(s): AST, ALT, ALKPHOS, BILITOT, PROT, ALBUMIN in the last 168 hours. No results for input(s): LIPASE, AMYLASE in the last 168 hours. No results for input(s): AMMONIA in the last 168 hours. Coagulation Profile: No  results for input(s): INR, PROTIME in the last 168 hours. Cardiac Enzymes: No results for input(s): CKTOTAL, CKMB, CKMBINDEX, TROPONINI in the last 168 hours. BNP (last 3 results) No results for input(s): PROBNP in the last 8760 hours. HbA1C: No results for input(s): HGBA1C in the last 72 hours. CBG: No results for input(s): GLUCAP in the last 168 hours. Lipid Profile: No results for input(s): CHOL, HDL, LDLCALC, TRIG, CHOLHDL, LDLDIRECT in the last 72 hours. Thyroid Function Tests: No results for input(s): TSH, T4TOTAL, FREET4, T3FREE, THYROIDAB in the last 72 hours. Anemia Panel: No results for input(s): VITAMINB12,  FOLATE, FERRITIN, TIBC, IRON, RETICCTPCT in the last 72 hours. Sepsis Labs:  Recent Labs Lab 11/29/16 1725  LATICACIDVEN 0.90    Recent Results (from the past 240 hour(s))  Culture, blood (routine x 2) Call MD if unable to obtain prior to antibiotics being given     Status: None (Preliminary result)   Collection Time: 11/29/16 11:00 PM  Result Value Ref Range Status   Specimen Description BLOOD LEFT ARM  Final   Special Requests BOTTLES DRAWN AEROBIC AND ANAEROBIC 5CC  Final   Culture NO GROWTH 1 DAY  Final   Report Status PENDING  Incomplete  Culture, blood (routine x 2) Call MD if unable to obtain prior to antibiotics being given     Status: None (Preliminary result)   Collection Time: 11/29/16 11:09 PM  Result Value Ref Range Status   Specimen Description BLOOD LEFT HAND  Final   Special Requests BOTTLES DRAWN AEROBIC AND ANAEROBIC 5CC  Final   Culture NO GROWTH 1 DAY  Final   Report Status PENDING  Incomplete  MRSA PCR Screening     Status: Abnormal   Collection Time: 11/30/16  1:14 AM  Result Value Ref Range Status   MRSA by PCR POSITIVE (A) NEGATIVE Final    Comment:        The GeneXpert MRSA Assay (FDA approved for NASAL specimens only), is one component of a comprehensive MRSA colonization surveillance program. It is not intended to diagnose MRSA infection nor to guide or monitor treatment for MRSA infections. RESULT CALLED TO, READ BACK BY AND VERIFIED WITHMadelon Lips RN [phone] 11/30/16 A BROWNING      Radiology Studies: Dg Forearm Right  Result Date: 11/29/2016 CLINICAL DATA:  [place] at nursing home today EXAM: RIGHT FOREARM - 2 VIEW COMPARISON:  None FINDINGS: Mild osseous demineralization. Minimal degenerative changes at radiocarpal joint. Non fused ossicles adjacent to pisiform. Joint spaces otherwise preserved. No acute fracture, dislocation or bone destruction. IMPRESSION: No acute osseous abnormalities. Electronically Signed   By: Lavonia Dana M.D.   On:  11/29/2016 17:37    Scheduled Meds: . amLODipine  5 mg Oral Daily  . aspirin  81 mg Oral Daily  . azithromycin  500 mg Oral Q24H  . carbidopa-levodopa  1 tablet Oral TID  . cefTRIAXone (ROCEPHIN)  IV  1 g Intravenous Q24H  . Chlorhexidine Gluconate Cloth  6 each Topical Q0600  . clopidogrel  75 mg Oral QPM  . entacapone  200 mg Oral TID  . feeding supplement (GLUCERNA SHAKE)  237 mL Oral BID BM  . ferrous sulfate  325 mg Oral BID WC  . hydrALAZINE  25 mg Oral BID  . isosorbide mononitrate  30 mg Oral Daily  . lamoTRIgine  150 mg Oral BID  . lisinopril  5 mg Oral Daily  . multivitamin with minerals  1 tablet Oral Daily  . mupirocin  ointment  1 application Nasal BID  . pantoprazole  40 mg Oral Daily  . polyethylene glycol  17 g Oral Daily  . predniSONE  2.5 mg Oral Q breakfast  . QUEtiapine  75 mg Oral QHS  . rosuvastatin  20 mg Oral q1800  . sertraline  200 mg Oral Daily  . traZODone  50 mg Oral QHS   Continuous Infusions:   LOS: 2 days    Chipper Oman, MD [facility] Pager [phone]  If 7PM-7AM, please contact night-coverage [email or web address] Password [facility] 12/01/2016, 4:37 PM

## 2016-12-02 LAB — BASIC METABOLIC PANEL
ANION GAP: 8 (ref 5–15)
BUN: 21 mg/dL — AB (ref 6–20)
CHLORIDE: 102 mmol/L (ref 101–111)
CO2: 28 mmol/L (ref 22–32)
Calcium: 8.5 mg/dL — ABNORMAL LOW (ref 8.9–10.3)
Creatinine, Ser: 1.19 mg/dL (ref 0.61–1.24)
GFR calc Af Amer: >60 mL/min (ref >60)
GFR, EST NON AFRICAN AMERICAN: 56 mL/min — AB (ref >60)
GLUCOSE: 100 mg/dL — AB (ref 65–99)
POTASSIUM: 4.1 mmol/L (ref 3.5–5.1)
SODIUM: 138 mmol/L (ref 135–145)

## 2016-12-02 LAB — CBC
HEMATOCRIT: 32.5 % — AB (ref 39.0–52.0)
HEMOGLOBIN: 10.6 g/dL — AB (ref 13.0–17.0)
MCH: 29.8 pg (ref 26.0–34.0)
MCHC: 32.6 g/dL (ref 30.0–36.0)
MCV: 91.3 fL (ref 78.0–100.0)
Platelets: 161 10*3/uL (ref 150–400)
RBC: 3.56 MIL/uL — ABNORMAL LOW (ref 4.22–5.81)
RDW: 14.4 % (ref 11.5–15.5)
WBC: 5 10*3/uL (ref 4.0–10.5)

## 2016-12-02 MED ORDER — ALBUTEROL SULFATE (2.5 MG/3ML) 0.083% IN NEBU: 2.5000 mg | INHALATION_SOLUTION | RESPIRATORY_TRACT | Status: DC | PRN

## 2016-12-02 MED ORDER — AMOXICILLIN-POT CLAVULANATE 875-125 MG PO TABS: 1.0000 | ORAL_TABLET | Freq: Two times a day (BID) | ORAL | 0 refills | Status: AC

## 2016-12-02 NOTE — Discharge Summary (Addendum)
Physician Discharge Summary  [name]  [id number]  DOB: 01-11-1935  DOA: 11/29/2016 PCP: Jilda Panda, MD  Admit date: 11/29/2016 Discharge date: 12/02/2016  Admitted From: ALF Disposition:  ALF  Recommendations for Outpatient Follow-up:  1. Follow up with PCP in 1-2 weeks 2. Please obtain BMP/CBC in one week 3. Please follow up on the following pending results: Final Blood cultures   Discharge Condition: Guarded patient with severe dementia, high risk for aspiration - high risk of readmission  CODE STATUS: FULL Diet recommendation: Heart Healthy / Carb Modified - Soft   Brief/Interim Summary: 81 year old male with significant history of Parkinson disease, indwelling chronic Foley catheter with recurrents UTIs, hypertension. Presented to the ED after mechanical fall while he was standing in the bathroom. Hitting his head causing a hematoma and open lesion of the forehead. Upon evaluation in the ER patient presented with purulent productive cough and elevated white count. Patient was admitted for community-acquired pneumonia started on IV antibiotics. Forehead laceration was repaired in the ED Patient received IV fluid and Rocephin with Azithro. Blood cultures were sent which so far have no growth. Patient White count improved. Patient has been afebrile during the hospital stay. Patient friend came and reported him to be mentally at baseline. Patient will be discharge back to ALF with oral antibiotics to complete a course of 7 days. Other chronic conditions remained stable during hospital stay.   Subjective: Patient seen and examined, today seen more lucid. Pleasant and feeling great. Patient have no new complaints. No acute events overnight. Cough has improved since admission but still present.   Discharge Diagnoses/Hospital Course:  Community-acquired pneumonia - initial checks x-ray was negative for infectious process, although clinically suggesting pneumonia. Afebrile, WBC  back to normal.  Influenza and strep antigen negative Received 3 days of Ceftriaxone and completed a course of Azithromycin 500 mg  Will send patient on Augmentin give risk of aspiration to complete a course of 5 days.  Blood cultures no growth in 48 hrs - follow up final result with PCP  Repeated CXR after hydration does not show any infectious process.   Hypertension - stable during the hospital stay Continue home meds  Parkinson/Dementia - Per family member patient mentally at baseline  Continue home medications   MRSA positive colonization  Treated with bacitracin   Discharge Instructions  Discharge Instructions    Call MD for:  difficulty breathing, headache or visual disturbances    Complete by:  As directed    Call MD for:  extreme fatigue    Complete by:  As directed    Call MD for:  hives    Complete by:  As directed    Call MD for:  persistant dizziness or light-headedness    Complete by:  As directed    Call MD for:  persistant nausea and vomiting    Complete by:  As directed    Call MD for:  redness, tenderness, or signs of infection (pain, swelling, redness, odor or green/yellow discharge around incision site)    Complete by:  As directed    Call MD for:  severe uncontrolled pain    Complete by:  As directed    Call MD for:  temperature >100.4    Complete by:  As directed    Diet - low sodium heart healthy    Complete by:  As directed    Increase activity slowly    Complete by:  As directed      Allergies as of  12/02/2016      Reactions   Plant Sterols And Stanols Other (See Comments)   ON MAR      Medication List    STOP taking these medications   ketoconazole 2 % cream Commonly known as:  NIZORAL     TAKE these medications   acetaminophen 325 MG tablet Commonly known as:  TYLENOL Take 650 mg by mouth every 6 (six) hours as needed for mild pain.   amLODipine 5 MG tablet Commonly known as:  NORVASC Take 5 mg by mouth daily.    amoxicillin-clavulanate 875-125 MG tablet Commonly known as:  AUGMENTIN Take 1 tablet by mouth 2 (two) times daily.   aspirin 81 MG chewable tablet Chew 81 mg by mouth daily.   BAZA PROTECT EX Apply 1 application topically 2 (two) times daily.   bisacodyl 10 MG suppository Commonly known as:  DULCOLAX Place 10 mg rectally as needed for moderate constipation.   carbidopa-levodopa 25-250 MG tablet Commonly known as:  SINEMET IR Take 1 tablet by mouth 3 (three) times daily.   clonazePAM 0.5 MG tablet Commonly known as:  KLONOPIN Take 0.5 tablets (0.25 mg total) by mouth 2 (two) times daily as needed for anxiety.   clopidogrel 75 MG tablet Commonly known as:  PLAVIX Take 1 tablet (75 mg total) by mouth daily. What changed:  when to take this   entacapone 200 MG tablet Commonly known as:  COMTAN Take 200 mg by mouth 3 (three) times daily.   feeding supplement (GLUCERNA SHAKE) Liqd Take 237 mLs by mouth 2 (two) times daily between meals.   ferrous sulfate 325 (65 FE) MG tablet Take 325 mg by mouth 2 (two) times daily.   guaifenesin 100 MG/5ML syrup Commonly known as:  ROBITUSSIN Take 200 mg by mouth 3 (three) times daily as needed for cough.   hydrALAZINE 25 MG tablet Commonly known as:  APRESOLINE Take 25 mg by mouth 2 (two) times daily.   isosorbide mononitrate 30 MG 24 hr tablet Commonly known as:  IMDUR Take 1 tablet (30 mg total) by mouth daily.   lamoTRIgine 150 MG tablet Commonly known as:  LAMICTAL Take 1 tablet (150 mg total) by mouth 2 (two) times daily.   lisinopril 10 MG tablet Commonly known as:  PRINIVIL,ZESTRIL Take 0.5 tablets (5 mg total) by mouth daily.   loratadine 10 MG tablet Commonly known as:  CLARITIN Take 10 mg by mouth daily as needed for allergies.   multivitamin with minerals Tabs tablet Take 1 tablet by mouth daily.   pantoprazole 40 MG tablet Commonly known as:  PROTONIX Take 40 mg by mouth daily. Reported on 07/06/2016    polyethylene glycol packet Commonly known as:  MIRALAX / GLYCOLAX Take 17 g by mouth daily.   predniSONE 2.5 MG tablet Commonly known as:  DELTASONE Take 2.5 mg by mouth daily with breakfast.   QUEtiapine 25 MG tablet Commonly known as:  SEROQUEL Take 3 tablets (75 mg total) by mouth at bedtime.   RESOURCE THICKENUP CLEAR Powd Take 120 g by mouth as needed. What changed:  when to take this  reasons to take this   rosuvastatin 20 MG tablet Commonly known as:  CRESTOR Take 20 mg by mouth daily.   sertraline 100 MG tablet Commonly known as:  ZOLOFT Take 2 tablets (200 mg total) by mouth daily.   traZODone 50 MG tablet Commonly known as:  DESYREL Take 1 tablet (50 mg total) by mouth at bedtime.  Follow-up Information    MOREIRA,ROY, MD. Schedule an appointment as soon as possible for a visit in 2 week(s).   Specialty:  Internal Medicine Contact information: [id number] [place] [id number] [phone]          Allergies  Allergen Reactions  . Plant Sterols And Stanols Other (See Comments)    ON MAR    Consultations:  None   Procedures/Studies: Dg Chest 2 View  Result Date: 12/01/2016 CLINICAL DATA:  Pneumonia. EXAM: CHEST  2 VIEW COMPARISON:  11/29/2016. FINDINGS: Stable enlarged cardiac silhouette with a prominent left ventricular contour. Stable linear scarring in the right mid lung zone. The remainder of the lungs remain clear. Severe bilateral shoulder degenerative changes. IMPRESSION: No acute abnormality. Stable cardiomegaly with probable left ventricular hypertrophy. Electronically Signed   By: Claudie Revering M.D.   On: 12/01/2016 19:32   Dg Chest 2 View  Result Date: 11/29/2016 CLINICAL DATA:  Fall at nursing facility. EXAM: CHEST  2 VIEW COMPARISON:  Radiographs of March 13, 2016. FINDINGS: Stable cardiomediastinal silhouette. Atherosclerosis of thoracic aorta is noted. No pneumothorax is noted. Stable scarring is noted in right  midlung. No acute pulmonary disease is noted. Old left rib fractures are noted. Severe degenerative changes seen involving both glenohumeral joints. IMPRESSION: Aortic atherosclerosis.  No acute cardiopulmonary abnormality seen. Electronically Signed   By: Marijo Conception, M.D.   On: 11/29/2016 16:08   Dg Forearm Right  Result Date: 11/29/2016 CLINICAL DATA:  [place] at nursing home today EXAM: RIGHT FOREARM - 2 VIEW COMPARISON:  None FINDINGS: Mild osseous demineralization. Minimal degenerative changes at radiocarpal joint. Non fused ossicles adjacent to pisiform. Joint spaces otherwise preserved. No acute fracture, dislocation or bone destruction. IMPRESSION: No acute osseous abnormalities. Electronically Signed   By: Lavonia Dana M.D.   On: 11/29/2016 17:37   Ct Head Wo Contrast  Result Date: 11/29/2016 CLINICAL DATA:  [place] while walking, possible bleed, head laceration EXAM: CT HEAD WITHOUT CONTRAST CT CERVICAL SPINE WITHOUT CONTRAST TECHNIQUE: Multidetector CT imaging of the head and cervical spine was performed following the standard protocol without intravenous contrast. Multiplanar CT image reconstructions of the cervical spine were also generated. COMPARISON:  None. FINDINGS: CT HEAD FINDINGS Brain: No intracranial hemorrhage, mass effect or midline shift. Stable atrophy and chronic white matter disease. No acute cortical infarction. No mass lesion is noted on this unenhanced scan. Vascular: Atherosclerotic calcifications of carotid siphon Skull: No skull fracture is noted. Sinuses/Orbits: No paranasal sinuses air-fluid levels. Other: There is midline anterior frontal scalp swelling axial image 25. Clinical correlation is necessary CT CERVICAL SPINE FINDINGS Alignment: Normal alignment. Skull base and vertebrae: No acute fracture or subluxation. Degenerative changes C1-C2 articulation. Moderate anterior spurring at C4-C5 level. Mild anterior spurring at C5-C6 and C6-C7 level. Mild posterior spurring  at C6-C7 level. Soft tissues and spinal canal: No prevertebral soft tissue swelling. Spinal canal is patent. Multilevel mild facet degenerative changes are noted. Disc levels:  Mild disc space flattening at C5-C6 and C6-C7 level. Upper chest: There is no pneumothorax in visualized lung apices. IMPRESSION: 1. No acute intracranial abnormality. Stable atrophy and chronic white matter disease. No definite acute cortical infarction. There is scalp swelling midline anterior frontal region. 2. No cervical spine acute fracture or subluxation. Degenerative changes as described above. Electronically Signed   By: Lahoma Crocker M.D.   On: 11/29/2016 16:00   Ct Cervical Spine Wo Contrast  Result Date: 11/29/2016 CLINICAL DATA:  [place] while walking, possible bleed,  head laceration EXAM: CT HEAD WITHOUT CONTRAST CT CERVICAL SPINE WITHOUT CONTRAST TECHNIQUE: Multidetector CT imaging of the head and cervical spine was performed following the standard protocol without intravenous contrast. Multiplanar CT image reconstructions of the cervical spine were also generated. COMPARISON:  None. FINDINGS: CT HEAD FINDINGS Brain: No intracranial hemorrhage, mass effect or midline shift. Stable atrophy and chronic white matter disease. No acute cortical infarction. No mass lesion is noted on this unenhanced scan. Vascular: Atherosclerotic calcifications of carotid siphon Skull: No skull fracture is noted. Sinuses/Orbits: No paranasal sinuses air-fluid levels. Other: There is midline anterior frontal scalp swelling axial image 25. Clinical correlation is necessary CT CERVICAL SPINE FINDINGS Alignment: Normal alignment. Skull base and vertebrae: No acute fracture or subluxation. Degenerative changes C1-C2 articulation. Moderate anterior spurring at C4-C5 level. Mild anterior spurring at C5-C6 and C6-C7 level. Mild posterior spurring at C6-C7 level. Soft tissues and spinal canal: No prevertebral soft tissue swelling. Spinal canal is patent.  Multilevel mild facet degenerative changes are noted. Disc levels:  Mild disc space flattening at C5-C6 and C6-C7 level. Upper chest: There is no pneumothorax in visualized lung apices. IMPRESSION: 1. No acute intracranial abnormality. Stable atrophy and chronic white matter disease. No definite acute cortical infarction. There is scalp swelling midline anterior frontal region. 2. No cervical spine acute fracture or subluxation. Degenerative changes as described above. Electronically Signed   By: Lahoma Crocker M.D.   On: 11/29/2016 16:00     Discharge Exam: Vitals:   12/02/16 0443 12/02/16 1234  BP: 140/64 121/62  Pulse: 64 61  Resp: 20 17  Temp: 99.9 F (37.7 C) 97.7 F (36.5 C)   Vitals:   12/01/16 1255 12/01/16 2211 12/02/16 0443 12/02/16 1234  BP: (!) 104/54 (!) 153/63 140/64 121/62  Pulse: 64 68 64 61  Resp: 17 18 20 17   Temp: 98 F (36.7 C) 99.6 F (37.6 C) 99.9 F (37.7 C) 97.7 F (36.5 C)  TempSrc: Oral Oral Oral Oral  SpO2: 92% 90% 91% 100%  Weight:      Height:        General: Pt is alert, awake, not in acute distress, confused Cardiovascular: RRR, S1/S2 +, no rubs, no gallops Respiratory:  Good air entry scattered rhonchi, mild expiratory wheezing 2:1 throughout Abdominal: Soft, NT, ND, bowel sounds + Extremities: no edema, no cyanosis   The results of significant diagnostics from this hospitalization (including imaging, microbiology, ancillary and laboratory) are listed below for reference.     Microbiology: Recent Results (from the past 240 hour(s))  Culture, blood (routine x 2) Call MD if unable to obtain prior to antibiotics being given     Status: None (Preliminary result)   Collection Time: 11/29/16 11:00 PM  Result Value Ref Range Status   Specimen Description BLOOD LEFT ARM  Final   Special Requests BOTTLES DRAWN AEROBIC AND ANAEROBIC 5CC  Final   Culture NO GROWTH 1 DAY  Final   Report Status PENDING  Incomplete  Culture, blood (routine x 2) Call MD  if unable to obtain prior to antibiotics being given     Status: None (Preliminary result)   Collection Time: 11/29/16 11:09 PM  Result Value Ref Range Status   Specimen Description BLOOD LEFT HAND  Final   Special Requests BOTTLES DRAWN AEROBIC AND ANAEROBIC 5CC  Final   Culture NO GROWTH 1 DAY  Final   Report Status PENDING  Incomplete  MRSA PCR Screening     Status: Abnormal   Collection  Time: 11/30/16  1:14 AM  Result Value Ref Range Status   MRSA by PCR POSITIVE (A) NEGATIVE Final    Comment:        The GeneXpert MRSA Assay (FDA approved for NASAL specimens only), is one component of a comprehensive MRSA colonization surveillance program. It is not intended to diagnose MRSA infection nor to guide or monitor treatment for MRSA infections. RESULT CALLED TO, READ BACK BY AND VERIFIED WITHMadelon Lips RN [phone] 11/30/16 A BROWNING      Labs: BNP (last 3 results) No results for input(s): BNP in the last 8760 hours. Basic Metabolic Panel:  Recent Labs Lab 11/29/16 1712 12/02/16 0509  NA 141 138  K 4.0 4.1  CL 107 102  CO2 24 28  GLUCOSE 131* 100*  BUN 29* 21*  CREATININE 1.08 1.19  CALCIUM 8.8* 8.5*   Liver Function Tests: No results for input(s): AST, ALT, ALKPHOS, BILITOT, PROT, ALBUMIN in the last 168 hours. No results for input(s): LIPASE, AMYLASE in the last 168 hours. No results for input(s): AMMONIA in the last 168 hours. CBC:  Recent Labs Lab 11/29/16 1712 12/02/16 0509  WBC 11.4* 5.0  NEUTROABS 10.6*  --   HGB 12.0* 10.6*  HCT 37.2* 32.5*  MCV 92.8 91.3  PLT 168 161   Cardiac Enzymes: No results for input(s): CKTOTAL, CKMB, CKMBINDEX, TROPONINI in the last 168 hours. BNP: Invalid input(s): POCBNP CBG: No results for input(s): GLUCAP in the last 168 hours. D-Dimer No results for input(s): DDIMER in the last 72 hours. Hgb A1c No results for input(s): HGBA1C in the last 72 hours. Lipid Profile No results for input(s): CHOL, HDL, LDLCALC, TRIG,  CHOLHDL, LDLDIRECT in the last 72 hours. Thyroid function studies No results for input(s): TSH, T4TOTAL, T3FREE, THYROIDAB in the last 72 hours.  Invalid input(s): FREET3 Anemia work up No results for input(s): VITAMINB12, FOLATE, FERRITIN, TIBC, IRON, RETICCTPCT in the last 72 hours. Urinalysis    Component Value Date/Time   COLORURINE AMBER (A) 11/29/2016 1805   APPEARANCEUR CLOUDY (A) 11/29/2016 1805   LABSPEC 1.020 11/29/2016 1805   PHURINE 8.0 11/29/2016 1805   GLUCOSEU NEGATIVE 11/29/2016 1805   HGBUR NEGATIVE 11/29/2016 1805   BILIRUBINUR NEGATIVE 11/29/2016 1805   KETONESUR NEGATIVE 11/29/2016 1805   PROTEINUR 100 (A) 11/29/2016 1805   UROBILINOGEN 0.2 10/05/2015 1555   NITRITE NEGATIVE 11/29/2016 1805   LEUKOCYTESUR TRACE (A) 11/29/2016 1805   Sepsis Labs Invalid input(s): PROCALCITONIN,  WBC,  LACTICIDVEN Microbiology Recent Results (from the past 240 hour(s))  Culture, blood (routine x 2) Call MD if unable to obtain prior to antibiotics being given     Status: None (Preliminary result)   Collection Time: 11/29/16 11:00 PM  Result Value Ref Range Status   Specimen Description BLOOD LEFT ARM  Final   Special Requests BOTTLES DRAWN AEROBIC AND ANAEROBIC 5CC  Final   Culture NO GROWTH 1 DAY  Final   Report Status PENDING  Incomplete  Culture, blood (routine x 2) Call MD if unable to obtain prior to antibiotics being given     Status: None (Preliminary result)   Collection Time: 11/29/16 11:09 PM  Result Value Ref Range Status   Specimen Description BLOOD LEFT HAND  Final   Special Requests BOTTLES DRAWN AEROBIC AND ANAEROBIC 5CC  Final   Culture NO GROWTH 1 DAY  Final   Report Status PENDING  Incomplete  MRSA PCR Screening     Status: Abnormal   Collection Time:  11/30/16  1:14 AM  Result Value Ref Range Status   MRSA by PCR POSITIVE (A) NEGATIVE Final    Comment:        The GeneXpert MRSA Assay (FDA approved for NASAL specimens only), is one component of  a comprehensive MRSA colonization surveillance program. It is not intended to diagnose MRSA infection nor to guide or monitor treatment for MRSA infections. RESULT CALLED TO, READ BACK BY AND VERIFIED WITHMadelon Lips RN [phone] 11/30/16 A BROWNING     Time coordinating discharge: Over 30 minutes  SIGNED:  Chipper Oman, MD  [facility] 12/02/2016, 1:03 PM Pager   If 7PM-7AM, please contact night-coverage [email or web address] Password [email or web address]

## 2016-12-02 NOTE — NC FL2 (Signed)
[place] LEVEL OF CARE SCREENING TOOL     IDENTIFICATION  Patient Name: [name] Birthdate: 06-07-1935 Sex: male Admission Date (Current Location): 11/29/2016  [facility] and [place] Number:  Herbalist and Address:  [facility] [place]. [facility], [place] [address] [place], [place] [address]      Provider Number: [id number]  Attending Physician Name and Address:  Doreatha Lew, MD  Relative Name and Phone Number:  [name], [phone]    Current Level of Care: Hospital Recommended Level of Care: [place] Prior Approval Number:    Date Approved/Denied:   PASRR Number: [id number]  Discharge Plan: Other (Comment) ([name])    Current Diagnoses: Patient Active Problem List   Diagnosis Date Noted  . CAP (community acquired pneumonia) 11/29/2016  . Bacteremia 03/14/2016  . HCAP (healthcare-associated pneumonia)   . Pressure ulcer 10/01/2015  . Right lower lobe pneumonia ([place]) 10/01/2015  . Sepsis due to Gram-negative organism with acute respiratory failure ([place]) 10/01/2015  . Acute renal failure ([place]) 10/01/2015  . Acute respiratory failure with hypoxia ([place]) 09/30/2015  . Influenza A (H1N1) 03/25/2015  . Dementia without behavioral disturbance 03/25/2015  . Hyperkalemia 03/25/2015  . Fall 03/16/2015  . Sacral pressure ulcer 03/16/2015  . Fever 03/16/2015  . Memory difficulty 08/21/2014  . Malnutrition of moderate degree ([place]) 04/19/2014  . UTI (lower urinary tract infection) 04/18/2014  . Hypoxia 04/18/2014  . PNA (pneumonia) 04/18/2014  . Chronic diastolic congestive heart failure ([place]) 02/01/2013  . Pericardial effusion 02/01/2013  . Bradycardia 01/31/2013  . Empyema lung ([place]) 12/23/2012  . S/P thoracotomy 12/23/2012  . Abnormality of gait 12/05/2012  . Hypernatremia 08/13/2012  . Hypertension   . Diabetes mellitus, type 2 ([place])   . Arthritis   . Bipolar disorder ([place])   . Anxiety    . Chronic indwelling Foley catheter   . Neuromuscular disorder ([place])   . Bacteremia of undetermined etiology 06/17/2012  . Parkinson disease ([place]) 04/16/2012  . Elevated troponin 04/16/2012  . Hearing aid worn   . Diabetes mellitus ([place]) 07/06/2011    Orientation RESPIRATION BLADDER Height & Weight     Self, Place  Normal Continent Weight: 157 lb 1.6 oz (71.3 kg) Height:  5\' 10"  (177.8 cm)  BEHAVIORAL SYMPTOMS/MOOD NEUROLOGICAL BOWEL NUTRITION STATUS      Incontinent Diet (Diet heart)  AMBULATORY STATUS COMMUNICATION OF NEEDS Skin   Limited Assist Verbally Normal                       Personal Care Assistance Level of Assistance  Bathing, Feeding, Dressing Bathing Assistance: Limited assistance Feeding assistance: Limited assistance Dressing Assistance: Limited assistance     Functional Limitations Info  Sight, Hearing, Speech Sight Info: Impaired Hearing Info: Impaired Speech Info: Impaired    SPECIAL CARE FACTORS FREQUENCY                       Contractures      Additional Factors Info  Code Status, Allergies, Isolation Precautions, Psychotropic Code Status Info: DNR Allergies Info: Plant sterols, stanols Psychotropic Info: zoloft, seroquel   Isolation Precautions Info: MRSA     Current Medications (12/02/2016):  This is the current hospital active medication list Current Facility-Administered Medications  Medication Dose Route Frequency Provider Last Rate Last Dose  . acetaminophen (TYLENOL) tablet 650 mg  650 mg Oral Q6H PRN Etta Quill, DO      .  albuterol (PROVENTIL) (2.5 MG/3ML) 0.083% nebulizer solution 2.5 mg  2.5 mg Nebulization Q4H PRN Doreatha Lew, MD      . amLODipine (NORVASC) tablet 5 mg  5 mg Oral Daily Etta Quill, DO   5 mg at 12/02/16 0909  . aspirin chewable tablet 81 mg  81 mg Oral Daily Etta Quill, DO   81 mg at 12/02/16 0910  . bisacodyl (DULCOLAX) suppository 10 mg  10 mg Rectal PRN Etta Quill, DO       . carbidopa-levodopa (SINEMET IR) 25-250 MG per tablet immediate release 1 tablet  1 tablet Oral TID Etta Quill, DO   1 tablet at 12/02/16 1402  . cefTRIAXone (ROCEPHIN) 1 g in dextrose 5 % 50 mL IVPB  1 g Intravenous Q24H Etta Quill, DO   1 g at 12/01/16 2134  . Chlorhexidine Gluconate Cloth 2 % PADS 6 each  6 each Topical Q0600 Etta Quill, DO   6 each at 12/02/16 0600  . clonazePAM (KLONOPIN) tablet 0.25 mg  0.25 mg Oral BID PRN Etta Quill, DO      . clopidogrel (PLAVIX) tablet 75 mg  75 mg Oral QPM Etta Quill, DO   75 mg at 12/01/16 1751  . entacapone (COMTAN) tablet 200 mg  200 mg Oral TID Etta Quill, DO   200 mg at 12/02/16 1402  . feeding supplement (GLUCERNA SHAKE) (GLUCERNA SHAKE) liquid 237 mL  237 mL Oral BID BM Etta Quill, DO   237 mL at 12/02/16 0913  . ferrous sulfate tablet 325 mg  325 mg Oral BID WC Etta Quill, DO   325 mg at 12/02/16 0910  . guaiFENesin (ROBITUSSIN) 100 MG/5ML solution 200 mg  200 mg Oral TID PRN Etta Quill, DO   200 mg at 11/30/16 0138  . hydrALAZINE (APRESOLINE) tablet 25 mg  25 mg Oral BID Etta Quill, DO   25 mg at 12/02/16 0910  . isosorbide mononitrate (IMDUR) 24 hr tablet 30 mg  30 mg Oral Daily Etta Quill, DO   30 mg at 12/02/16 [id number]  . lamoTRIgine (LAMICTAL) tablet 150 mg  150 mg Oral BID Etta Quill, DO   150 mg at 12/02/16 0909  . lisinopril (PRINIVIL,ZESTRIL) tablet 5 mg  5 mg Oral Daily Etta Quill, DO   5 mg at 12/02/16 [id number]  . multivitamin with minerals tablet 1 tablet  1 tablet Oral Daily Etta Quill, DO   1 tablet at 12/02/16 0910  . mupirocin ointment (BACTROBAN) 2 % 1 application  1 application Nasal BID Etta Quill, DO   1 application at [id number]  . pantoprazole (PROTONIX) EC tablet 40 mg  40 mg Oral Daily Etta Quill, DO   40 mg at 12/02/16 [id number]  . polyethylene glycol (MIRALAX / GLYCOLAX) packet 17 g  17 g Oral Daily Etta Quill, DO   17 g at 12/02/16 0909  .  predniSONE (DELTASONE) tablet 2.5 mg  2.5 mg Oral Q breakfast Etta Quill, DO   2.5 mg at 12/02/16 0909  . QUEtiapine (SEROQUEL) tablet 75 mg  75 mg Oral QHS Etta Quill, DO   75 mg at 12/01/16 2133  . RESOURCE THICKENUP CLEAR   Oral PRN Etta Quill, DO      . rosuvastatin (CRESTOR) tablet 20 mg  20 mg Oral q1800 Etta Quill, DO   20 mg at 12/01/16  1751  . sertraline (ZOLOFT) tablet 200 mg  200 mg Oral Daily Etta Quill, DO   200 mg at 12/02/16 0910  . traZODone (DESYREL) tablet 50 mg  50 mg Oral QHS Etta Quill, DO   50 mg at 12/01/16 2133     Discharge Medications: Please see discharge summary for a list of discharge medications.  Relevant Imaging Results:  Relevant Lab Results:   Additional Information SSN [id number]  Wende Neighbors, LCSW

## 2016-12-02 NOTE — NC FL2 (Signed)
[place] LEVEL OF CARE SCREENING TOOL     IDENTIFICATION  Patient Name: [name] Birthdate: [birth year] Sex: male Admission Date (Current Location): 11/29/2016  [facility] and [place] Number:  Herbalist and Address:  The [place]. [facility], [place] [address], [place], [place] [address]      Provider Number: [id number]  Attending Physician Name and Address:  Doreatha Lew, MD  Relative Name and Phone Number:  [name], [phone]    Current Level of Care: Hospital Recommended Level of Care: [place] Prior Approval Number:    Date Approved/Denied:   PASRR Number: [id number]  Discharge Plan: Other (Comment) ([name])    Current Diagnoses: Patient Active Problem List   Diagnosis Date Noted  . CAP (community acquired pneumonia) 11/29/2016  . Bacteremia 03/14/2016  . HCAP (healthcare-associated pneumonia)   . Pressure ulcer 10/01/2015  . Right lower lobe pneumonia ([place]) 10/01/2015  . Sepsis due to Gram-negative organism with acute respiratory failure ([place]) 10/01/2015  . Acute renal failure ([place]) 10/01/2015  . Acute respiratory failure with hypoxia ([place]) 09/30/2015  . Influenza A (H1N1) 03/25/2015  . Dementia without behavioral disturbance 03/25/2015  . Hyperkalemia 03/25/2015  . Fall 03/16/2015  . Sacral pressure ulcer 03/16/2015  . Fever 03/16/2015  . Memory difficulty 08/21/2014  . Malnutrition of moderate degree ([place]) 04/19/2014  . UTI (lower urinary tract infection) 04/18/2014  . Hypoxia 04/18/2014  . PNA (pneumonia) 04/18/2014  . Chronic diastolic congestive heart failure ([place]) 02/01/2013  . Pericardial effusion 02/01/2013  . Bradycardia 01/31/2013  . Empyema lung ([place]) 12/23/2012  . S/P thoracotomy 12/23/2012  . Abnormality of gait 12/05/2012  . Hypernatremia 08/13/2012  . Hypertension   . Diabetes mellitus, type 2 ([place])   . Arthritis   . Bipolar disorder ([place])   . Anxiety    . Chronic indwelling Foley catheter   . Neuromuscular disorder ([place])   . Bacteremia of undetermined etiology 06/17/2012  . Parkinson disease ([place]) 04/16/2012  . Elevated troponin 04/16/2012  . Hearing aid worn   . Diabetes mellitus ([place]) 07/06/2011    Orientation RESPIRATION BLADDER Height & Weight     Self, Place  Normal Continent Weight: 157 lb 1.6 oz (71.3 kg) Height:  5\' 10"  (177.8 cm)  BEHAVIORAL SYMPTOMS/MOOD NEUROLOGICAL BOWEL NUTRITION STATUS      Incontinent Diet (Diet heart)  AMBULATORY STATUS COMMUNICATION OF NEEDS Skin   Limited Assist Verbally Normal                       Personal Care Assistance Level of Assistance  Bathing, Feeding, Dressing Bathing Assistance: Limited assistance Feeding assistance: Limited assistance Dressing Assistance: Limited assistance     Functional Limitations Info  Sight, Hearing, Speech Sight Info: Impaired Hearing Info: Impaired Speech Info: Impaired    SPECIAL CARE FACTORS FREQUENCY                       Contractures      Additional Factors Info  Code Status, Allergies, Isolation Precautions, Psychotropic Code Status Info: DNR Allergies Info: Plant sterols, stanols Psychotropic Info: zoloft, seroquel   Isolation Precautions Info: MRSA     Current Medications (12/02/2016):  This is the current hospital active medication list Current Facility-Administered Medications  Medication Dose Route Frequency Provider Last Rate Last Dose  . acetaminophen (TYLENOL) tablet 650 mg  650 mg Oral Q6H PRN Etta Quill, DO      .  albuterol (PROVENTIL) (2.5 MG/3ML) 0.083% nebulizer solution 2.5 mg  2.5 mg Nebulization Q4H PRN Doreatha Lew, MD      . amLODipine (NORVASC) tablet 5 mg  5 mg Oral Daily Etta Quill, DO   5 mg at 12/02/16 0909  . aspirin chewable tablet 81 mg  81 mg Oral Daily Etta Quill, DO   81 mg at 12/02/16 0910  . bisacodyl (DULCOLAX) suppository 10 mg  10 mg Rectal PRN Etta Quill, DO       . carbidopa-levodopa (SINEMET IR) 25-250 MG per tablet immediate release 1 tablet  1 tablet Oral TID Etta Quill, DO   1 tablet at 12/02/16 0910  . cefTRIAXone (ROCEPHIN) 1 g in dextrose 5 % 50 mL IVPB  1 g Intravenous Q24H Etta Quill, DO   1 g at 12/01/16 2134  . Chlorhexidine Gluconate Cloth 2 % PADS 6 each  6 each Topical Q0600 Etta Quill, DO   6 each at 12/02/16 0600  . clonazePAM (KLONOPIN) tablet 0.25 mg  0.25 mg Oral BID PRN Etta Quill, DO      . clopidogrel (PLAVIX) tablet 75 mg  75 mg Oral QPM Etta Quill, DO   75 mg at 12/01/16 1751  . entacapone (COMTAN) tablet 200 mg  200 mg Oral TID Etta Quill, DO   200 mg at 12/02/16 0910  . feeding supplement (GLUCERNA SHAKE) (GLUCERNA SHAKE) liquid 237 mL  237 mL Oral BID BM Etta Quill, DO   237 mL at 12/02/16 0913  . ferrous sulfate tablet 325 mg  325 mg Oral BID WC Etta Quill, DO   325 mg at 12/02/16 0910  . guaiFENesin (ROBITUSSIN) 100 MG/5ML solution 200 mg  200 mg Oral TID PRN Etta Quill, DO   200 mg at 11/30/16 0138  . hydrALAZINE (APRESOLINE) tablet 25 mg  25 mg Oral BID Etta Quill, DO   25 mg at 12/02/16 0910  . isosorbide mononitrate (IMDUR) 24 hr tablet 30 mg  30 mg Oral Daily Etta Quill, DO   30 mg at 12/02/16 [id number]  . lamoTRIgine (LAMICTAL) tablet 150 mg  150 mg Oral BID Etta Quill, DO   150 mg at 12/02/16 0909  . lisinopril (PRINIVIL,ZESTRIL) tablet 5 mg  5 mg Oral Daily Etta Quill, DO   5 mg at 12/02/16 [id number]  . multivitamin with minerals tablet 1 tablet  1 tablet Oral Daily Etta Quill, DO   1 tablet at 12/02/16 0910  . mupirocin ointment (BACTROBAN) 2 % 1 application  1 application Nasal BID Etta Quill, DO   1 application at [id number]  . pantoprazole (PROTONIX) EC tablet 40 mg  40 mg Oral Daily Etta Quill, DO   40 mg at 12/02/16 [id number]  . polyethylene glycol (MIRALAX / GLYCOLAX) packet 17 g  17 g Oral Daily Etta Quill, DO   17 g at 12/02/16 0909  .  predniSONE (DELTASONE) tablet 2.5 mg  2.5 mg Oral Q breakfast Etta Quill, DO   2.5 mg at 12/02/16 0909  . QUEtiapine (SEROQUEL) tablet 75 mg  75 mg Oral QHS Etta Quill, DO   75 mg at 12/01/16 2133  . RESOURCE THICKENUP CLEAR   Oral PRN Etta Quill, DO      . rosuvastatin (CRESTOR) tablet 20 mg  20 mg Oral q1800 Etta Quill, DO   20 mg at 12/01/16  1751  . sertraline (ZOLOFT) tablet 200 mg  200 mg Oral Daily Etta Quill, DO   200 mg at 12/02/16 0910  . traZODone (DESYREL) tablet 50 mg  50 mg Oral QHS Etta Quill, DO   50 mg at 12/01/16 2133     Discharge Medications: Please see discharge summary for a list of discharge medications.  Relevant Imaging Results:  Relevant Lab Results:   Additional Information SSN [id number]  Wende Neighbors, LCSW

## 2016-12-02 NOTE — NC FL2 (Signed)
[place] LEVEL OF CARE SCREENING TOOL     IDENTIFICATION  Patient Name: [name] Birthdate: 06/17/1935 Sex: male Admission Date (Current Location): 11/29/2016  [facility] and [place] Number:  Herbalist and Address:  The [place]. [facility], [place] [address] [place], [place] [address]      Provider Number: [id number]  Attending Physician Name and Address:  Doreatha Lew, MD  Relative Name and Phone Number:  [name], [phone]    Current Level of Care: Hospital Recommended Level of Care: [place] Prior Approval Number:    Date Approved/Denied:   PASRR Number: [id number]  Discharge Plan: Other (Comment) ([name])    Current Diagnoses: Patient Active Problem List   Diagnosis Date Noted  . CAP (community acquired pneumonia) 11/29/2016  . Bacteremia 03/14/2016  . HCAP (healthcare-associated pneumonia)   . Pressure ulcer 10/01/2015  . Right lower lobe pneumonia ([place]) 10/01/2015  . Sepsis due to Gram-negative organism with acute respiratory failure ([place]) 10/01/2015  . Acute renal failure ([place]) 10/01/2015  . Acute respiratory failure with hypoxia ([place]) 09/30/2015  . Influenza A (H1N1) 03/25/2015  . Dementia without behavioral disturbance 03/25/2015  . Hyperkalemia 03/25/2015  . Fall 03/16/2015  . Sacral pressure ulcer 03/16/2015  . Fever 03/16/2015  . Memory difficulty 08/21/2014  . Malnutrition of moderate degree ([place]) 04/19/2014  . UTI (lower urinary tract infection) 04/18/2014  . Hypoxia 04/18/2014  . PNA (pneumonia) 04/18/2014  . Chronic diastolic congestive heart failure ([place]) 02/01/2013  . Pericardial effusion 02/01/2013  . Bradycardia 01/31/2013  . Empyema lung ([place]) 12/23/2012  . S/P thoracotomy 12/23/2012  . Abnormality of gait 12/05/2012  . Hypernatremia 08/13/2012  . Hypertension   . Diabetes mellitus, type 2 ([place])   . Arthritis   . Bipolar disorder ([place])   . Anxiety    . Chronic indwelling Foley catheter   . Neuromuscular disorder ([place])   . Bacteremia of undetermined etiology 06/17/2012  . Parkinson disease ([place]) 04/16/2012  . Elevated troponin 04/16/2012  . Hearing aid worn   . Diabetes mellitus ([place]) 07/06/2011    Orientation RESPIRATION BLADDER Height & Weight     Self, Place  Normal Continent Weight: 157 lb 1.6 oz (71.3 kg) Height:  5\' 10"  (177.8 cm)  BEHAVIORAL SYMPTOMS/MOOD NEUROLOGICAL BOWEL NUTRITION STATUS      Incontinent Diet (Diet heart)  AMBULATORY STATUS COMMUNICATION OF NEEDS Skin   Limited Assist Verbally Normal                       Personal Care Assistance Level of Assistance  Bathing, Feeding, Dressing Bathing Assistance: Limited assistance Feeding assistance: Limited assistance Dressing Assistance: Limited assistance     Functional Limitations Info  Sight, Hearing, Speech Sight Info: Impaired Hearing Info: Impaired Speech Info: Impaired    SPECIAL CARE FACTORS FREQUENCY                       Contractures      Additional Factors Info  Code Status, Allergies, Isolation Precautions, Psychotropic Code Status Info: DNR Allergies Info: Plant sterols, stanols Psychotropic Info: zoloft, seroquel   Isolation Precautions Info: MRSA     Current Medications (12/02/2016):  This is the current hospital active medication list Current Facility-Administered Medications  Medication Dose Route Frequency Provider Last Rate Last Dose  . acetaminophen (TYLENOL) tablet 650 mg  650 mg Oral Q6H PRN Etta Quill, DO      .  amLODipine (NORVASC) tablet 5 mg  5 mg Oral Daily Etta Quill, DO   5 mg at 12/02/16 0909  . aspirin chewable tablet 81 mg  81 mg Oral Daily Etta Quill, DO   81 mg at 12/02/16 0910  . azithromycin [facility]) tablet 500 mg  500 mg Oral Q24H Etta Quill, DO   500 mg at 12/01/16 2237  . bisacodyl (DULCOLAX) suppository 10 mg  10 mg Rectal PRN Etta Quill, DO      .  carbidopa-levodopa (SINEMET IR) 25-250 MG per tablet immediate release 1 tablet  1 tablet Oral TID Etta Quill, DO   1 tablet at 12/02/16 0910  . cefTRIAXone (ROCEPHIN) 1 g in dextrose 5 % 50 mL IVPB  1 g Intravenous Q24H Etta Quill, DO   1 g at 12/01/16 2134  . Chlorhexidine Gluconate Cloth 2 % PADS 6 each  6 each Topical Q0600 Etta Quill, DO   6 each at 12/02/16 0600  . clonazePAM (KLONOPIN) tablet 0.25 mg  0.25 mg Oral BID PRN Etta Quill, DO      . clopidogrel (PLAVIX) tablet 75 mg  75 mg Oral QPM Etta Quill, DO   75 mg at 12/01/16 1751  . entacapone (COMTAN) tablet 200 mg  200 mg Oral TID Etta Quill, DO   200 mg at 12/02/16 0910  . feeding supplement (GLUCERNA SHAKE) (GLUCERNA SHAKE) liquid 237 mL  237 mL Oral BID BM Etta Quill, DO   237 mL at 12/02/16 0913  . ferrous sulfate tablet 325 mg  325 mg Oral BID WC Etta Quill, DO   325 mg at 12/02/16 0910  . guaiFENesin (ROBITUSSIN) 100 MG/5ML solution 200 mg  200 mg Oral TID PRN Etta Quill, DO   200 mg at 11/30/16 0138  . hydrALAZINE (APRESOLINE) tablet 25 mg  25 mg Oral BID Etta Quill, DO   25 mg at 12/02/16 0910  . isosorbide mononitrate (IMDUR) 24 hr tablet 30 mg  30 mg Oral Daily Etta Quill, DO   30 mg at 12/02/16 [id number]  . lamoTRIgine (LAMICTAL) tablet 150 mg  150 mg Oral BID Etta Quill, DO   150 mg at 12/02/16 0909  . lisinopril (PRINIVIL,ZESTRIL) tablet 5 mg  5 mg Oral Daily Etta Quill, DO   5 mg at 12/02/16 [id number]  . multivitamin with minerals tablet 1 tablet  1 tablet Oral Daily Etta Quill, DO   1 tablet at 12/02/16 0910  . mupirocin ointment (BACTROBAN) 2 % 1 application  1 application Nasal BID Etta Quill, DO   1 application at [id number] 0913  . pantoprazole (PROTONIX) EC tablet 40 mg  40 mg Oral Daily Etta Quill, DO   40 mg at 12/02/16 [id number]  . polyethylene glycol (MIRALAX / GLYCOLAX) packet 17 g  17 g Oral Daily Etta Quill, DO   17 g at 12/02/16 0909  .  predniSONE (DELTASONE) tablet 2.5 mg  2.5 mg Oral Q breakfast Etta Quill, DO   2.5 mg at 12/02/16 0909  . QUEtiapine (SEROQUEL) tablet 75 mg  75 mg Oral QHS Etta Quill, DO   75 mg at 12/01/16 2133  . RESOURCE THICKENUP CLEAR   Oral PRN Etta Quill, DO      . rosuvastatin (CRESTOR) tablet 20 mg  20 mg Oral q1800 Etta Quill, DO   20 mg at 12/01/16 1751  .  sertraline (ZOLOFT) tablet 200 mg  200 mg Oral Daily Etta Quill, DO   200 mg at 12/02/16 0910  . traZODone (DESYREL) tablet 50 mg  50 mg Oral QHS Etta Quill, DO   50 mg at 12/01/16 2133     Discharge Medications: Please see discharge summary for a list of discharge medications.  Relevant Imaging Results:  Relevant Lab Results:   Additional Information SSN [id number]  Wende Neighbors, LCSW

## 2016-12-02 NOTE — [place] Notes (Signed)
Clinical Social Worker facilitated patient discharge including contacting patient family and facility to confirm patient discharge plans.  Clinical information faxed to facility and family agreeable with plan.  CSW arranged ambulance transport via [facility] to [facility] .  RN Marc Morgans to call [phone] for report prior to discharge.  Clinical Social Worker will sign off for now as social work intervention is no longer needed. Please consult [place] again if new need arises.  Rhea Pink, MSW, [place]

## 2016-12-05 LAB — CULTURE, BLOOD (ROUTINE X 2)
CULTURE: NO GROWTH
Culture: NO GROWTH

## 2016-12-07 ENCOUNTER — Ambulatory Visit (INDEPENDENT_AMBULATORY_CARE_PROVIDER_SITE_OTHER): Payer: Medicare Other | Admitting: Neurology

## 2016-12-07 ENCOUNTER — Encounter: Payer: Self-pay | Admitting: Neurology

## 2016-12-07 VITALS — BP 112/60 | HR 60 | Resp 16

## 2016-12-07 DIAGNOSIS — R413 Other amnesia: Secondary | ICD-10-CM | POA: Diagnosis not present

## 2016-12-07 DIAGNOSIS — E538 Deficiency of other specified B group vitamins: Secondary | ICD-10-CM | POA: Diagnosis not present

## 2016-12-07 DIAGNOSIS — R269 Unspecified abnormalities of gait and mobility: Secondary | ICD-10-CM

## 2016-12-07 NOTE — Progress Notes (Signed)
Reason for visit: Gait disorder  [name] is an 81 y.o. male  History of present illness:  [name] is a 81 year old right-handed white male with a history of bipolar disorder and a history of a chronic gait disorder with features of parkinsonism. The patient has recently been in the hospital around 11/29/2016 with a fall that occurred while [facility] [place] [employer]. The patient required staples in a wound on the top of the head. He was admitted for treatment of a underlying pneumonia. The patient has a chronic issue with a pressure sore that is still a problem. The patient has had some alteration in his ability to walk that started at the end of September 2017. The patient began developing delusional thinking, claiming that his bones were brittle and that he could not walk, he felt as if there was a fungal infection throughout his body and that his feet were too big to fit into his shoes. The patient essentially refused to continue walking. Physical therapy has been set up, but the patient has so far refused this. The patient comes back to this office for reevaluation following the hospitalization. CT scan of the brain done in the hospital did not show any acute intracranial abnormalities.  Past Medical History:  Diagnosis Date  . Anxiety   . Arthritis   . Benign prostate hyperplasia   . Bipolar disorder ([place])   . C2 cervical fracture ([name])    due to pt fall  . Chronic indwelling Foley catheter   . Depression   . Diabetes mellitus type II   . Falls   . Flu   . Hearing aid worn   . Hypertension   . Memory difficulty 08/21/2014  . Memory loss   . MRSA infection (methicillin-resistant Staphylococcus aureus)   . Neuromuscular disorder ([place])    parkinsons  . Parkinson disease ([place])   . Pneumonia   . SIRS (systemic inflammatory response syndrome) ([name])   . UTI (urinary tract infection)     Past Surgical History:  Procedure Laterality Date  . TOTAL HIP ARTHROPLASTY    .  VIDEO ASSISTED THORACOSCOPY (VATS)/EMPYEMA  12/16/2012   Procedure: VIDEO ASSISTED THORACOSCOPY (VATS)/EMPYEMA;  Surgeon: Melrose Nakayama, MD;  Location: [place];  Service: Thoracic;  Laterality: Right;  Marland Kitchen VIDEO BRONCHOSCOPY  12/16/2012   Procedure: VIDEO BRONCHOSCOPY;  Surgeon: Melrose Nakayama, MD;  Location: [place];  Service: Thoracic;  Laterality: Right;    Family History  Problem Relation Age of Onset  . Depression Father   . Cancer Mother   . Heart disease Mother     Social history:  reports that he has never smoked. He has never used smokeless tobacco. He reports that he does not drink alcohol or use drugs.    Allergies  Allergen Reactions  . Plant Sterols And Stanols Other (See Comments)    ON MAR    Medications:  Prior to Admission medications   Medication Sig Start Date End Date Taking? Authorizing Provider  acetaminophen (TYLENOL) 325 MG tablet Take 650 mg by mouth every 6 (six) hours as needed for mild pain.   Yes Historical Provider, MD  amLODipine (NORVASC) 5 MG tablet Take 5 mg by mouth daily.   Yes Historical Provider, MD  amoxicillin-clavulanate (AUGMENTIN) 875-125 MG tablet Take 1 tablet by mouth 2 (two) times daily. 12/02/16 12/07/16 Yes Doreatha Lew, MD  aspirin 81 MG chewable tablet Chew 81 mg by mouth daily.   Yes Historical  Provider, MD  bisacodyl (DULCOLAX) 10 MG suppository Place 10 mg rectally as needed for moderate constipation.   Yes Historical Provider, MD  carbidopa-levodopa (SINEMET IR) 25-250 MG per tablet Take 1 tablet by mouth 3 (three) times daily.   Yes Historical Provider, MD  clonazePAM (KLONOPIN) 0.5 MG tablet Take 0.5 tablets (0.25 mg total) by mouth 2 (two) times daily as needed for anxiety. 08/31/16  Yes Kathlee Nations, MD  clopidogrel (PLAVIX) 75 MG tablet Take 1 tablet (75 mg total) by mouth daily. Patient taking differently: Take 75 mg by mouth every evening.  10/07/15  Yes Florencia Reasons, MD  entacapone (COMTAN) 200 MG tablet Take 200  mg by mouth 3 (three) times daily.   Yes Historical Provider, MD  feeding supplement, GLUCERNA SHAKE, (GLUCERNA SHAKE) LIQD Take 237 mLs by mouth 2 (two) times daily between meals. 03/16/16  Yes Kelvin Cellar, MD  ferrous sulfate 325 (65 FE) MG tablet Take 325 mg by mouth 2 (two) times daily.   Yes Historical Provider, MD  guaifenesin (ROBITUSSIN) 100 MG/5ML syrup Take 200 mg by mouth 3 (three) times daily as needed for cough.   Yes Historical Provider, MD  hydrALAZINE (APRESOLINE) 25 MG tablet Take 25 mg by mouth 2 (two) times daily.    Yes Historical Provider, MD  isosorbide mononitrate (IMDUR) 30 MG 24 hr tablet Take 1 tablet (30 mg total) by mouth daily. 10/07/15  Yes Florencia Reasons, MD  lamoTRIgine (LAMICTAL) 150 MG tablet Take 1 tablet (150 mg total) by mouth 2 (two) times daily. 08/31/16  Yes Kathlee Nations, MD  lisinopril (PRINIVIL,ZESTRIL) 10 MG tablet Take 0.5 tablets (5 mg total) by mouth daily. 10/07/15  Yes Florencia Reasons, MD  loratadine (CLARITIN) 10 MG tablet Take 10 mg by mouth daily as needed for allergies.   Yes Historical Provider, MD  Maltodextrin-Xanthan Gum (RESOURCE THICKENUP CLEAR) POWD Take 120 g by mouth as needed. Patient taking differently: Take 1 Can by mouth daily as needed (takes 120mg  as needed food thickener).  10/07/15  Yes Florencia Reasons, MD  Multiple Vitamin (MULTIVITAMIN WITH MINERALS) TABS tablet Take 1 tablet by mouth daily.   Yes Historical Provider, MD  pantoprazole (PROTONIX) 40 MG tablet Take 40 mg by mouth daily. Reported on 07/06/2016   Yes Historical Provider, MD  polyethylene glycol (MIRALAX / GLYCOLAX) packet Take 17 g by mouth daily.   Yes Historical Provider, MD  predniSONE (DELTASONE) 2.5 MG tablet Take 2.5 mg by mouth daily with breakfast.   Yes Historical Provider, MD  QUEtiapine (SEROQUEL) 25 MG tablet Take 3 tablets (75 mg total) by mouth at bedtime. 08/31/16  Yes Kathlee Nations, MD  rosuvastatin (CRESTOR) 20 MG tablet Take 20 mg by mouth daily.   Yes Historical  Provider, MD  sertraline (ZOLOFT) 100 MG tablet Take 2 tablets (200 mg total) by mouth daily. 08/31/16  Yes Kathlee Nations, MD  Skin Protectants, Misc. (BAZA PROTECT EX) Apply 1 application topically 2 (two) times daily.    Yes Historical Provider, MD  traZODone (DESYREL) 50 MG tablet Take 1 tablet (50 mg total) by mouth at bedtime. 08/31/16  Yes Kathlee Nations, MD    ROS:  Out of a complete 14 system review of symptoms, the patient complains only of the following symptoms, and all other reviewed systems are negative.  Decreased activity Cough Incontinence of the bowels Walking difficulty Skin wounds Memory loss, weakness Confusion  Blood pressure 112/60, pulse 60, resp. rate 16.  Physical Exam  General: The patient is alert and cooperative at the time of the examination.  Skin: No significant peripheral edema is noted.   Neurologic Exam  Mental status: The patient is alert and oriented x 2 at the time of the examination (not oriented to date). The Mini-Mental Status Examination done today shows a total score of 17/30.   Cranial nerves: Facial symmetry is present. Speech is normal, no aphasia or dysarthria is noted. Extraocular movements are full. Visual fields are full.  Motor: The patient has good strength in all 4 extremities.  Sensory examination: Soft touch sensation is symmetric on the face, arms, and legs.  Coordination: The patient has good finger-nose-finger and heel-to-shin bilaterally. Some apraxia with the use of the lower extremities is noted.  Gait and station: The patient is able to stand with assistance, but he is unable to ambulate. He has a tendency to lean backwards that is quite prominent.  Reflexes: Deep tendon reflexes are symmetric.   CT head and cervical 11/29/16:  IMPRESSION: 1. No acute intracranial abnormality. Stable atrophy and chronic white matter disease. No definite acute cortical infarction. There is scalp swelling midline anterior  frontal region. 2. No cervical spine acute fracture or subluxation. Degenerative changes as described above.  * CT scan images were reviewed online. I agree with the written report.    Assessment/Plan:  1. Progressive memory disorder  2. Gait disorder with recent exacerbation  3. Bipolar disorder, delusional thinking  4. Recent pneumonia  This patient has had a significant decline in his functional level. He is no longer able to ambulate, he will lean backwards and he is unable to stand independently. The patient apparently had begun refusing to walk secondary to delusional thinking several months ago. The patient has had a recent pneumonia which may have exacerbated his functional level as well. The patient was sent for blood work today, he will have MRI evaluation of the brain. The patient is to engage with physical therapy, he seems to agree to this on the visit today. He will follow-up in about 4 months.  Jill Alexanders MD 12/07/2016 12:01 PM  [facility] [address] [place], [place] [address]  Phone [phone] Fax [phone]

## 2016-12-11 ENCOUNTER — Encounter (HOSPITAL_COMMUNITY): Payer: Self-pay | Admitting: Emergency Medicine

## 2016-12-11 ENCOUNTER — Emergency Department (HOSPITAL_COMMUNITY)
Admission: EM | Admit: 2016-12-11 | Discharge: 2016-12-12 | Disposition: A | Discharge status: Home / Self Care | Payer: Medicare Other | Attending: Emergency Medicine | Admitting: Emergency Medicine

## 2016-12-11 DIAGNOSIS — Z23 Encounter for immunization: Secondary | ICD-10-CM | POA: Diagnosis not present

## 2016-12-11 DIAGNOSIS — W19XXXA Unspecified fall, initial encounter: Secondary | ICD-10-CM

## 2016-12-11 DIAGNOSIS — Z7902 Long term (current) use of antithrombotics/antiplatelets: Secondary | ICD-10-CM | POA: Diagnosis not present

## 2016-12-11 DIAGNOSIS — W010XXA Fall on same level from slipping, tripping and stumbling without subsequent striking against object, initial encounter: Secondary | ICD-10-CM | POA: Insufficient documentation

## 2016-12-11 DIAGNOSIS — S0081XA Abrasion of other part of head, initial encounter: Secondary | ICD-10-CM | POA: Diagnosis not present

## 2016-12-11 DIAGNOSIS — E119 Type 2 diabetes mellitus without complications: Secondary | ICD-10-CM | POA: Diagnosis not present

## 2016-12-11 DIAGNOSIS — Z96649 Presence of unspecified artificial hip joint: Secondary | ICD-10-CM | POA: Diagnosis not present

## 2016-12-11 DIAGNOSIS — Y999 Unspecified external cause status: Secondary | ICD-10-CM | POA: Diagnosis not present

## 2016-12-11 DIAGNOSIS — T148XXA Other injury of unspecified body region, initial encounter: Secondary | ICD-10-CM

## 2016-12-11 DIAGNOSIS — S80212A Abrasion, left knee, initial encounter: Secondary | ICD-10-CM | POA: Diagnosis not present

## 2016-12-11 DIAGNOSIS — Z4802 Encounter for removal of sutures: Secondary | ICD-10-CM | POA: Diagnosis not present

## 2016-12-11 DIAGNOSIS — Y939 Activity, unspecified: Secondary | ICD-10-CM | POA: Insufficient documentation

## 2016-12-11 DIAGNOSIS — Z79899 Other long term (current) drug therapy: Secondary | ICD-10-CM | POA: Insufficient documentation

## 2016-12-11 DIAGNOSIS — G20 Parkinson's disease: Secondary | ICD-10-CM | POA: Diagnosis not present

## 2016-12-11 DIAGNOSIS — Y92129 Unspecified place in nursing home as the place of occurrence of the external cause: Secondary | ICD-10-CM | POA: Diagnosis not present

## 2016-12-11 DIAGNOSIS — N3000 Acute cystitis without hematuria: Secondary | ICD-10-CM | POA: Diagnosis not present

## 2016-12-11 DIAGNOSIS — Z7982 Long term (current) use of aspirin: Secondary | ICD-10-CM | POA: Diagnosis not present

## 2016-12-11 DIAGNOSIS — S0990XA Unspecified injury of head, initial encounter: Secondary | ICD-10-CM | POA: Diagnosis present

## 2016-12-11 DIAGNOSIS — I10 Essential (primary) hypertension: Secondary | ICD-10-CM | POA: Diagnosis not present

## 2016-12-11 NOTE — ED Provider Notes (Signed)
[facility] DEPT Provider Note   CSN: [id number] Arrival date & time: 12/11/16  2333  By signing my name below, I, Reola Mosher, attest that this documentation has been prepared under the direction and in the presence of Ezequiel Essex, MD. Electronically Signed: Reola Mosher, ED Scribe. 12/12/16. 12:03 AM.  History   Chief Complaint Chief Complaint  Patient presents with  . Fall   LEVEL V CAVEAT: HPI and ROS limited due to dementia  The history is provided by the patient and medical records. History limited by: dementia. No language interpreter was used.   HPI Comments: THERYN CICH is a 81 y.o. male BIB EMS from [facility], with a PMHx of DM, Parkinson's disease, and chronic foley placement with recurrent UTIs, who presents to the Emergency Department s/p ground-level, mechanical fall which occurred prior to arrival. Pt reports that he was getting up from his sofa tonight when he tripped over one of his shoes, causing him to fall forwards onto the ground below him. Pt denies dizziness or chest pain prior to this. No LOC; however, upon EMS's evaluation they noted wounds were sustained to the forehead and left knee. Bleeding to these areas is controlled. Per prior chart review, pt was recently admitted to the hospital on 11/29/16 ( ~13 days ago) following a similar fall. CT Head at that time was negative for acute intracranial abnormalities, however, UA was also obtained which was remarkable for UTI. CXR was also performed which was negative, however, d/t a productive cough and mild leukocytosis, he was additionally admitted on CAP criteria. Pt also notes that he has a right sacral wound, which is in moderate pain while in the ED; however, this has been an issue for him for several months and he has been using barrier creme at home to control this. Pt is currently anticoagulated on Plavix therapy daily. He uses a wheelchair towards his baseline. He denies abdominal  pain, headache, neck pain, fever, arthralgias/myalgias or pain otherwise.  Past Medical History:  Diagnosis Date  . Anxiety   . Arthritis   . Benign prostate hyperplasia   . Bipolar disorder ([place])   . C2 cervical fracture ([name])    due to pt fall  . Chronic indwelling Foley catheter   . Depression   . Diabetes mellitus type II   . Falls   . Flu   . Hearing aid worn   . Hypertension   . Memory difficulty 08/21/2014  . Memory loss   . MRSA infection (methicillin-resistant Staphylococcus aureus)   . Neuromuscular disorder ([place])    parkinsons  . Parkinson disease ([place])   . Pneumonia   . SIRS (systemic inflammatory response syndrome) ([name])   . UTI (urinary tract infection)    Patient Active Problem List   Diagnosis Date Noted  . CAP (community acquired pneumonia) 11/29/2016  . Bacteremia 03/14/2016  . HCAP (healthcare-associated pneumonia)   . Pressure ulcer 10/01/2015  . Right lower lobe pneumonia ([place]) 10/01/2015  . Sepsis due to Gram-negative organism with acute respiratory failure ([place]) 10/01/2015  . Acute renal failure ([place]) 10/01/2015  . Acute respiratory failure with hypoxia ([place]) 09/30/2015  . Influenza A (H1N1) 03/25/2015  . Dementia without behavioral disturbance 03/25/2015  . Hyperkalemia 03/25/2015  . Fall 03/16/2015  . Sacral pressure ulcer 03/16/2015  . Fever 03/16/2015  . Memory difficulty 08/21/2014  . Malnutrition of moderate degree ([place]) 04/19/2014  . UTI (lower urinary tract infection) 04/18/2014  . Hypoxia 04/18/2014  . PNA (pneumonia)  04/18/2014  . Chronic diastolic congestive heart failure ([place]) 02/01/2013  . Pericardial effusion 02/01/2013  . Bradycardia 01/31/2013  . Empyema lung ([place]) 12/23/2012  . S/P thoracotomy 12/23/2012  . Abnormality of gait 12/05/2012  . Hypernatremia 08/13/2012  . Hypertension   . Diabetes mellitus, type 2 ([place])   . Arthritis   . Bipolar disorder ([place])   . Anxiety   . Chronic indwelling Foley catheter   .  Neuromuscular disorder ([place])   . Bacteremia of undetermined etiology 06/17/2012  . Parkinson disease ([place]) 04/16/2012  . Elevated troponin 04/16/2012  . Hearing aid worn   . Diabetes mellitus ([place]) 07/06/2011   Past Surgical History:  Procedure Laterality Date  . TOTAL HIP ARTHROPLASTY    . VIDEO ASSISTED THORACOSCOPY (VATS)/EMPYEMA  12/16/2012   Procedure: VIDEO ASSISTED THORACOSCOPY (VATS)/EMPYEMA;  Surgeon: Melrose Nakayama, MD;  Location: [facility];  Service: Thoracic;  Laterality: Right;  Marland Kitchen VIDEO BRONCHOSCOPY  12/16/2012   Procedure: VIDEO BRONCHOSCOPY;  Surgeon: Melrose Nakayama, MD;  Location: [facility];  Service: Thoracic;  Laterality: Right;    Home Medications    Prior to Admission medications   Medication Sig Start Date End Date Taking? Authorizing Provider  acetaminophen (TYLENOL) 325 MG tablet Take 650 mg by mouth every 6 (six) hours as needed for mild pain.    Historical Provider, MD  amLODipine (NORVASC) 5 MG tablet Take 5 mg by mouth daily.    Historical Provider, MD  aspirin 81 MG chewable tablet Chew 81 mg by mouth daily.    Historical Provider, MD  bisacodyl (DULCOLAX) 10 MG suppository Place 10 mg rectally as needed for moderate constipation.    Historical Provider, MD  carbidopa-levodopa (SINEMET IR) 25-250 MG per tablet Take 1 tablet by mouth 3 (three) times daily.    Historical Provider, MD  ciprofloxacin (CIPRO) 500 MG tablet Take 1 tablet (500 mg total) by mouth 2 (two) times daily. 12/12/16   Ezequiel Essex, MD  clonazePAM (KLONOPIN) 0.5 MG tablet Take 0.5 tablets (0.25 mg total) by mouth 2 (two) times daily as needed for anxiety. 08/31/16   Kathlee Nations, MD  clopidogrel (PLAVIX) 75 MG tablet Take 1 tablet (75 mg total) by mouth daily. Patient taking differently: Take 75 mg by mouth every evening.  10/07/15   Florencia Reasons, MD  entacapone (COMTAN) 200 MG tablet Take 200 mg by mouth 3 (three) times daily.    Historical Provider, MD  feeding supplement, GLUCERNA  SHAKE, (GLUCERNA SHAKE) LIQD Take 237 mLs by mouth 2 (two) times daily between meals. 03/16/16   Kelvin Cellar, MD  ferrous sulfate 325 (65 FE) MG tablet Take 325 mg by mouth 2 (two) times daily.    Historical Provider, MD  guaifenesin (ROBITUSSIN) 100 MG/5ML syrup Take 200 mg by mouth 3 (three) times daily as needed for cough.    Historical Provider, MD  hydrALAZINE (APRESOLINE) 25 MG tablet Take 25 mg by mouth 2 (two) times daily.     Historical Provider, MD  isosorbide mononitrate (IMDUR) 30 MG 24 hr tablet Take 1 tablet (30 mg total) by mouth daily. 10/07/15   Florencia Reasons, MD  lamoTRIgine (LAMICTAL) 150 MG tablet Take 1 tablet (150 mg total) by mouth 2 (two) times daily. 08/31/16   Kathlee Nations, MD  lisinopril (PRINIVIL,ZESTRIL) 10 MG tablet Take 0.5 tablets (5 mg total) by mouth daily. 10/07/15   Florencia Reasons, MD  loratadine (CLARITIN) 10 MG tablet Take 10 mg by mouth daily as needed for  allergies.    Historical Provider, MD  Maltodextrin-Xanthan Gum (RESOURCE THICKENUP CLEAR) POWD Take 120 g by mouth as needed. Patient taking differently: Take 1 Can by mouth daily as needed (takes 120mg  as needed food thickener).  10/07/15   Florencia Reasons, MD  Multiple Vitamin (MULTIVITAMIN WITH MINERALS) TABS tablet Take 1 tablet by mouth daily.    Historical Provider, MD  pantoprazole (PROTONIX) 40 MG tablet Take 40 mg by mouth daily. Reported on 07/06/2016    Historical Provider, MD  polyethylene glycol (MIRALAX / GLYCOLAX) packet Take 17 g by mouth daily.    Historical Provider, MD  predniSONE (DELTASONE) 2.5 MG tablet Take 2.5 mg by mouth daily with breakfast.    Historical Provider, MD  QUEtiapine (SEROQUEL) 25 MG tablet Take 3 tablets (75 mg total) by mouth at bedtime. 08/31/16   Kathlee Nations, MD  rosuvastatin (CRESTOR) 20 MG tablet Take 20 mg by mouth daily.    Historical Provider, MD  sertraline (ZOLOFT) 100 MG tablet Take 2 tablets (200 mg total) by mouth daily. 08/31/16   Kathlee Nations, MD  Skin Protectants, Misc.  (BAZA PROTECT EX) Apply 1 application topically 2 (two) times daily.     Historical Provider, MD  traZODone (DESYREL) 50 MG tablet Take 1 tablet (50 mg total) by mouth at bedtime. 08/31/16   Kathlee Nations, MD   Family History Family History  Problem Relation Age of Onset  . Depression Father   . Cancer Mother   . Heart disease Mother    Social History Social History  Substance Use Topics  . Smoking status: Never Smoker  . Smokeless tobacco: Never Used  . Alcohol use No     Comment: occasional   Allergies   Plant sterols and stanols  Review of Systems Review of Systems  Unable to perform ROS: Dementia   Physical Exam Updated Vital Signs BP 148/71   Pulse (!) 56   Temp 97.7 F (36.5 C) (Oral)   Resp 16   Ht 5\' 10"  (1.778 m)   Wt 185 lb (83.9 kg)   SpO2 96%   BMI 26.54 kg/m   Physical Exam  Constitutional: He is oriented to person, place, and time. He appears well-developed and well-nourished. No distress.  HENT:  Head: Normocephalic.  Right Ear: No hemotympanum.  Left Ear: No hemotympanum.  Mouth/Throat: Oropharynx is clear and moist. No oropharyngeal exudate.  Abrasion to left forehead and left temple regions. No septal hematoma.   Eight staples and in place to the occiput region. Five sutures in place to the forehead. Sutures in forehead are scabbed over with dried blood. Areas appear well-healing and without surrounding signs of infection.   Eyes: Conjunctivae and EOM are normal. Pupils are equal, round, and reactive to light.  Neck: Normal range of motion. Neck supple.  No meningismus. No c-spine tenderness.   Cardiovascular: Normal rate, regular rhythm, normal heart sounds and intact distal pulses.   No murmur heard. Pulmonary/Chest: Effort normal and breath sounds normal. No respiratory distress.  Abdominal: Soft. There is no tenderness. There is no rebound and no guarding.  Genitourinary:  Genitourinary Comments: Chaperone present throughout entire exam.  Foley catheter is in place.   Musculoskeletal: Normal range of motion. He exhibits no edema or tenderness.  Abrasion to left knee. Pelvis is stable.   Neurological: He is alert and oriented to person, place, and time. No cranial nerve deficit. He exhibits normal muscle tone. Coordination normal.   5/5 strength throughout. CN  2-12 intact.Equal grip strength.   Skin: Skin is warm.  Stage 2 right sacral pressure ulcer. No drainage and area is non-tender.   Psychiatric: He has a normal mood and affect. His behavior is normal.  Nursing note and vitals reviewed.  ED Treatments / Results  DIAGNOSTIC STUDIES: Oxygen Saturation is 99% on RA, normal by my interpretation.   COORDINATION OF CARE: 12:03 AM-Discussed next steps with pt. Pt verbalized understanding and is agreeable with the plan.   Labs (all labs ordered are listed, but only abnormal results are displayed) Labs Reviewed  URINALYSIS, ROUTINE W REFLEX MICROSCOPIC - Abnormal; Notable for the following:       Result Value   APPearance HAZY (*)    Leukocytes, UA LARGE (*)    Bacteria, UA RARE (*)    All other components within normal limits  URINE CULTURE   Radiology Dg Chest 2 View  Result Date: 12/12/2016 CLINICAL DATA:  Patient fell. Pain and laceration of the anterior left knee. EXAM: CHEST  2 VIEW COMPARISON:  None. FINDINGS: Chronic appearing left fifth through eighth rib fractures. Heart is enlarged. Aortic atherosclerosis is noted. No pneumonic consolidation, effusion or pulmonary edema. There is minimal bibasilar atelectasis. Osteoarthritis of both glenohumeral joints with remodeling of the glenoid fossa. These appear stable. IMPRESSION: No active cardiopulmonary disease. Stable cardiomegaly. Osteoarthritis of both glenohumeral joints. Old left-sided rib fractures. Electronically Signed   By: Ashley Royalty M.D.   On: 12/12/2016 00:47   Ct Head Wo Contrast  Result Date: 12/12/2016 CLINICAL DATA:  Trip and fall this evening.  No loss of consciousness. Forehead abrasion. EXAM: CT HEAD WITHOUT CONTRAST CT CERVICAL SPINE WITHOUT CONTRAST TECHNIQUE: Multidetector CT imaging of the head and cervical spine was performed following the standard protocol without intravenous contrast. Multiplanar CT image reconstructions of the cervical spine were also generated. COMPARISON:  Head and cervical spine CT 11/29/2016, multiple priors FINDINGS: CT HEAD FINDINGS Brain: No evidence of acute infarction, hemorrhage, hydrocephalus, or extra-axial collection. Stable atrophy and chronic small vessel ischemia. Stable arachnoid cyst in the posterior fossa. Vascular: Atherosclerosis of skullbase vasculature without hyperdense vessel or abnormal calcification. Skull: Skin staples right parietal region. Negative for fracture or focal lesion. Sinuses/Orbits: Diffuse mucosal thickening of the ethmoid air cells and right frontal sinus. Mucosal thickening with fluid level involving the right maxillary sinus, new from prior exam. Other: None. CT CERVICAL SPINE FINDINGS Alignment: Straightening of normal lordosis. Unchanged minimal retrolisthesis of C6 on C7 on a degenerative basis. No jumped or perched facets. Skull base and vertebrae: No acute fracture. No primary bone lesion or focal pathologic process. Focal sclerotic density within C2 is likely a bone island, unchanged. Soft tissues and spinal canal: No prevertebral fluid or swelling. No visible canal hematoma. Disc levels: Disc space narrowing and endplate spurring is most prominent at C5-C6 and C6-C7. Multilevel facet arthropathy. Multilevel neural foraminal stenosis. Upper chest: No acute abnormality. Other: Carotid vascular calcifications. IMPRESSION: 1.  No acute intracranial abnormality.  No calvarial fracture. 2. Degenerative change in the cervical spine without acute fracture or subluxation. 3. Incidental note of sinusitis, possibly acute. Electronically Signed   By: Jeb Levering M.D.   On: 12/12/2016  02:02   Ct Cervical Spine Wo Contrast  Result Date: 12/12/2016 CLINICAL DATA:  Trip and fall this evening. No loss of consciousness. Forehead abrasion. EXAM: CT HEAD WITHOUT CONTRAST CT CERVICAL SPINE WITHOUT CONTRAST TECHNIQUE: Multidetector CT imaging of the head and cervical spine was performed following the standard protocol  without intravenous contrast. Multiplanar CT image reconstructions of the cervical spine were also generated. COMPARISON:  Head and cervical spine CT 11/29/2016, multiple priors FINDINGS: CT HEAD FINDINGS Brain: No evidence of acute infarction, hemorrhage, hydrocephalus, or extra-axial collection. Stable atrophy and chronic small vessel ischemia. Stable arachnoid cyst in the posterior fossa. Vascular: Atherosclerosis of skullbase vasculature without hyperdense vessel or abnormal calcification. Skull: Skin staples right parietal region. Negative for fracture or focal lesion. Sinuses/Orbits: Diffuse mucosal thickening of the ethmoid air cells and right frontal sinus. Mucosal thickening with fluid level involving the right maxillary sinus, new from prior exam. Other: None. CT CERVICAL SPINE FINDINGS Alignment: Straightening of normal lordosis. Unchanged minimal retrolisthesis of C6 on C7 on a degenerative basis. No jumped or perched facets. Skull base and vertebrae: No acute fracture. No primary bone lesion or focal pathologic process. Focal sclerotic density within C2 is likely a bone island, unchanged. Soft tissues and spinal canal: No prevertebral fluid or swelling. No visible canal hematoma. Disc levels: Disc space narrowing and endplate spurring is most prominent at C5-C6 and C6-C7. Multilevel facet arthropathy. Multilevel neural foraminal stenosis. Upper chest: No acute abnormality. Other: Carotid vascular calcifications. IMPRESSION: 1.  No acute intracranial abnormality.  No calvarial fracture. 2. Degenerative change in the cervical spine without acute fracture or subluxation. 3.  Incidental note of sinusitis, possibly acute. Electronically Signed   By: Jeb Levering M.D.   On: 12/12/2016 02:02   Dg Knee Complete 4 Views Left  Result Date: 12/12/2016 CLINICAL DATA:  Anterior knee pain after fall with laceration. EXAM: LEFT KNEE - COMPLETE 4+ VIEW COMPARISON:  None. FINDINGS: Small laceration involving the prepatellar soft tissues. Slight thickening along the distal quadriceps tendon. Tiny ossific densities project off the upper pole of the patella which may reflect sequela of trauma, age indeterminate. No definite donor site. Slight thickening of the distal quadriceps tendon may represent tendinopathy. No significant joint effusion. No acute fracture lucencies. IMPRESSION: Slight thickening along the expected location of the distal quadriceps with tiny ossific densities adjacent to the upper pole of the patella. Findings may reflect calcific tendinopathy or sequela of prior trauma, age indeterminate. Soft tissue swelling with small laceration noted overlying the patella. No acute appearing fracture nor joint effusion identified. Electronically Signed   By: Ashley Royalty M.D.   On: 12/12/2016 00:45   Procedures Procedures   SUTURE REMOVAL Performed by: Ezequiel Essex, MD Consent: Verbal consent obtained. Patient identity confirmed: provided demographic data Time out: Immediately prior to procedure a "time out" was called to verify the correct patient, procedure, equipment, support staff and site/side marked as required. Location: occiput region Wound Appearance: clean Staples Removed: 8 Patient tolerance: Patient tolerated the procedure well with no immediate complications.  SUTURE REMOVAL Performed by: Ezequiel Essex, MD Consent: Verbal consent obtained. Patient identity confirmed: provided demographic data Time out: Immediately prior to procedure a "time out" was called to verify the correct patient, procedure, equipment, support staff and site/side marked as  required. Location: forehead Wound Appearance: clean Sutures Removed: 5 Patient tolerance: Patient tolerated the procedure well with no immediate complications.  Medications Ordered in ED Medications  Tdap (BOOSTRIX) injection 0.5 mL (0.5 mLs Intramuscular Given 12/12/16 0019)   Initial Impression / Assessment and Plan / ED Course  I have reviewed the triage vital signs and the nursing notes.  Pertinent labs & imaging results that were available during my care of the patient were reviewed by me and considered in my medical decision making (see  chart for details).  Clinical Course   Patient presents from assisted living facility after trip and fall trying to get off of sofa. Denies loss of consciousness and has abrasion to his left forehead and skin tears to left knee. Indwelling Foley catheter. Recent admission for pneumonia and UTI. Denies any complaints. Imaging will be obtained given his use of Plavix and dementia and unreliable history.  CT head and C-spine are negative. No evidence of acute fractures. Wounds are cleaned. Urinalysis obtained from catheter sample appears to have many white blood cells and will be sent for culture.  Sutures and staples removed (were placed on 12/13).  Wounds healing appropriately. Will treat suspected UTI with cipro.   Appears stable to return to facility.   Final Clinical Impressions(s) / ED Diagnoses   Final diagnoses:  Fall, initial encounter  Abrasion  Acute cystitis without hematuria   New Prescriptions New Prescriptions   CIPROFLOXACIN (CIPRO) 500 MG TABLET    Take 1 tablet (500 mg total) by mouth 2 (two) times daily.   I personally performed the services described in this documentation, which was scribed in my presence. The recorded information has been reviewed and is accurate.    Ezequiel Essex, MD 12/12/16 [phone]

## 2016-12-11 NOTE — ED Triage Notes (Signed)
Pt. arrived with EMS from [facility] , he tripped and fell this evening , no LOC /alert at arrival , presents with small abrasion at left upper forehead , small skin tears at left elbow and left knee with mld bleeding . He has an indwelling foley catheter connected to a leg bag at arrival .

## 2016-12-12 ENCOUNTER — Emergency Department (HOSPITAL_COMMUNITY): Payer: Medicare Other

## 2016-12-12 DIAGNOSIS — S0081XA Abrasion of other part of head, initial encounter: Secondary | ICD-10-CM | POA: Diagnosis not present

## 2016-12-12 LAB — URINALYSIS, ROUTINE W REFLEX MICROSCOPIC
BILIRUBIN URINE: NEGATIVE
GLUCOSE, UA: NEGATIVE mg/dL
HGB URINE DIPSTICK: NEGATIVE
Ketones, ur: NEGATIVE mg/dL
Nitrite: NEGATIVE
PROTEIN: NEGATIVE mg/dL
SQUAMOUS EPITHELIAL / LPF: NONE SEEN
Specific Gravity, Urine: 1.008 (ref 1.005–1.030)
pH: 6 (ref 5.0–8.0)

## 2016-12-12 MED ORDER — TETANUS-DIPHTH-ACELL PERTUSSIS 5-2.5-18.5 LF-MCG/0.5 IM SUSP
0.5000 mL | Freq: Once | INTRAMUSCULAR | Status: AC
  Administered 2016-12-12: 0.5 mL via INTRAMUSCULAR
  Filled 2016-12-12: qty 0.5

## 2016-12-12 MED ORDER — CIPROFLOXACIN HCL 500 MG PO TABS: 500.0000 mg | ORAL_TABLET | Freq: Two times a day (BID) | ORAL | 0 refills | Status: DC

## 2016-12-12 NOTE — ED Notes (Signed)
Called [facility] at [phone]

## 2016-12-12 NOTE — Discharge Instructions (Signed)
Take the antibiotic as prescribed. Urine culture is pending and you will be called if the medication needs to be changed. Return to the ED if you develop new or worsening symptoms.

## 2016-12-15 LAB — RPR: RPR Ser Ql: NONREACTIVE

## 2016-12-15 LAB — COPPER, SERUM: COPPER: 101 ug/dL (ref 72–166)

## 2016-12-15 LAB — URINE CULTURE: Culture: >=100000 — AB

## 2016-12-15 LAB — AMMONIA: Ammonia: 27 ug/dL (ref 27–102)

## 2016-12-15 LAB — VITAMIN B1: THIAMINE: 191.6 nmol/L (ref 66.5–200.0)

## 2016-12-15 LAB — VITAMIN B12: VITAMIN B 12: 1153 pg/mL (ref 232–1245)

## 2016-12-16 ENCOUNTER — Telehealth: Payer: Self-pay

## 2016-12-16 NOTE — Telephone Encounter (Signed)
Post ED Visit - Positive Culture Follow-up  Culture report reviewed by antimicrobial stewardship pharmacist:  []  Elenor Quinones, Pharm.D. []  Heide Guile, Pharm.D., BCPS []  Parks Neptune, Pharm.D. []  Alycia Rossetti, Pharm.D., BCPS []  [place], Pharm.D., BCPS, AAHIVP []  Legrand Como, Pharm.D., BCPS, AAHIVP []  Milus Glazier, Pharm.D. []  Rob Evette Doffing, Pharm.D. Sharilyn Sites Phamr D Positive urine culture Treated with Ciprofloxacin, organism sensitive to the same and no further patient follow-up is required at this time.  Genia Del 12/16/2016, 9:47 AM

## 2016-12-20 ENCOUNTER — Ambulatory Visit
Admission: RE | Admit: 2016-12-20 | Discharge: 2016-12-20 | Disposition: A | Discharge status: Home / Self Care | Payer: Medicare Other | Source: Ambulatory Visit | Attending: Neurology | Admitting: Neurology

## 2016-12-20 DIAGNOSIS — R269 Unspecified abnormalities of gait and mobility: Secondary | ICD-10-CM | POA: Diagnosis not present

## 2016-12-20 DIAGNOSIS — R413 Other amnesia: Secondary | ICD-10-CM | POA: Diagnosis not present

## 2016-12-21 ENCOUNTER — Telehealth: Payer: Self-pay | Admitting: Neurology

## 2016-12-21 NOTE — Telephone Encounter (Signed)
  I called the caretaker. The MRI of the brain is unchanged from 4 years ago. The patient is to undergo PT for the walking.  MRI brain 12/21/16:  IMPRESSION:  Abnormal MRI brain (without) demonstrating: 1. Mild perisylvian and moderate mesial temporal atrophy.  2. Moderate ventriculomegaly on ex vacuo basis. 3. Mild periventricular and subcortical chronic small vessel ischemic disease.  4. Posterior fossa arachnoid cyst (4.2x3.3cm on axial views).  5. No acute findings. 6. No significant change from MRI on 01/31/13.

## 2017-01-08 ENCOUNTER — Encounter: Payer: Self-pay | Admitting: Podiatry

## 2017-01-08 ENCOUNTER — Ambulatory Visit (INDEPENDENT_AMBULATORY_CARE_PROVIDER_SITE_OTHER): Payer: Medicare Other | Admitting: Podiatry

## 2017-01-08 DIAGNOSIS — R52 Pain, unspecified: Secondary | ICD-10-CM | POA: Diagnosis not present

## 2017-01-08 DIAGNOSIS — B351 Tinea unguium: Secondary | ICD-10-CM

## 2017-01-08 NOTE — Progress Notes (Signed)
Subjective: 82 y.o. returns the office today for painful, elongated, thickened toenails which they cannot trim themself. Denies any redness or drainage around the nails.He is cornered that he has fungus over his entire body and this is why he cannot walk. He has followed up with neurology. His caregiver also states that they have discussed with his PCP. Denies any acute changes since last appointment and no new complaints today. Denies any systemic complaints such as fevers, chills, nausea, vomiting.   Objective: NAD DP/PT pulses palpable, CRT less than 3 seconds Nails hypertrophic, dystrophic, elongated, brittle, discolored 10. There is tenderness overlying the nails 1-5 bilaterally. There is no surrounding erythema or drainage along the nail sites. No open lesions or pre-ulcerative lesions are identified. No other areas of tenderness bilateral lower extremities. No overlying edema, erythema, increased warmth. No pain with calf compression, swelling, warmth, erythema.  Assessment: Patient presents with symptomatic onychomycosis  Plan: -Treatment options including alternatives, risks, complications were discussed -Nails sharply debrided 10 without complication/bleeding. -Continue PT for gait abnormality. Also continue to follow-up with Dr. Jannifer Franklin and his PCP.  -Discussed daily foot inspection. If there are any changes, to call the office immediately.  -Follow-up in 3 months or sooner if any problems are to arise. In the meantime, encouraged to call the office with any questions, concerns, changes symptoms.  Celesta Gentile, DPM

## 2017-01-09 ENCOUNTER — Ambulatory Visit: Payer: Medicare Other | Admitting: Neurology

## 2017-01-09 NOTE — Telephone Encounter (Signed)
Pt was admitted mid-Dec due to fall, was + for MRSA and treated for pneumonia. Later in the month, he was also on antibx for UTI.  He saw podiatrist yesterday for toenail care.

## 2017-01-09 NOTE — Telephone Encounter (Signed)
I called and left a message. The patient appears to have delusional thinking believing that a fungus is taking over his entire body. Delusional thinking may be quite difficult to treat. MRI the brain done in early January 2018 was unchanged from a study done in February 2014.

## 2017-01-17 ENCOUNTER — Ambulatory Visit (INDEPENDENT_AMBULATORY_CARE_PROVIDER_SITE_OTHER): Payer: Medicare Other | Admitting: Psychiatry

## 2017-01-17 ENCOUNTER — Encounter (HOSPITAL_COMMUNITY): Payer: Self-pay | Admitting: Psychiatry

## 2017-01-17 DIAGNOSIS — Z8249 Family history of ischemic heart disease and other diseases of the circulatory system: Secondary | ICD-10-CM | POA: Diagnosis not present

## 2017-01-17 DIAGNOSIS — Z818 Family history of other mental and behavioral disorders: Secondary | ICD-10-CM

## 2017-01-17 DIAGNOSIS — F3162 Bipolar disorder, current episode mixed, moderate: Secondary | ICD-10-CM | POA: Diagnosis not present

## 2017-01-17 DIAGNOSIS — F319 Bipolar disorder, unspecified: Secondary | ICD-10-CM | POA: Diagnosis not present

## 2017-01-17 DIAGNOSIS — Z79899 Other long term (current) drug therapy: Secondary | ICD-10-CM

## 2017-01-17 DIAGNOSIS — Z7982 Long term (current) use of aspirin: Secondary | ICD-10-CM

## 2017-01-17 DIAGNOSIS — Z9109 Other allergy status, other than to drugs and biological substances: Secondary | ICD-10-CM

## 2017-01-17 MED ORDER — SERTRALINE HCL 100 MG PO TABS: 200.0000 mg | ORAL_TABLET | Freq: Every day | ORAL | 2 refills | Status: DC

## 2017-01-17 MED ORDER — LAMOTRIGINE 150 MG PO TABS: 150.0000 mg | ORAL_TABLET | Freq: Two times a day (BID) | ORAL | 2 refills | Status: DC

## 2017-01-17 MED ORDER — CLONAZEPAM 0.5 MG PO TABS: 0.2500 mg | ORAL_TABLET | Freq: Every day | ORAL | 1 refills | Status: DC | PRN

## 2017-01-17 MED ORDER — QUETIAPINE FUMARATE 25 MG PO TABS: 75.0000 mg | ORAL_TABLET | Freq: Every day | ORAL | 2 refills | Status: DC

## 2017-01-17 MED ORDER — TRAZODONE HCL 50 MG PO TABS: 50.0000 mg | ORAL_TABLET | Freq: Every day | ORAL | 2 refills | Status: AC

## 2017-01-17 NOTE — Progress Notes (Signed)
[place] MD/PA/NP OP Progress Note  01/17/2017 11:05 AM [name]  MRN:  [id number]  Chief Complaint:  Chief Complaint    Follow-up     Subjective:  I was hospitalized due to fall.  I'm doing physical therapy and feeling much better.  HPI: Patient came for his follow-up appointment.  He was last seen in April 2017.  Today he came with his home health aid for her follow-up appointment.  He was admitted twice due to fall and now he is using wheelchair because he mentioned his legs are still weak and he cannot walk.  He is doing physical therapy.  Overall he described his depression is much better.  He denies any crying spells, irritability, paranoia, hallucination or any suicidal thoughts.  He is taking his medication as prescribed.  His energy level is low area he admitted lately feeling more isolated and withdrawn because he is bound to wheelchair.  He does not like [facility] food but overall he is satisfied with his living situation.  He is pleasant in conversation.  He is happy that his home health aide is very caring.  She comes few hours every night and also take her to the doctor's appointment.  He has a CNA 24 7.  Patient admitted staff is taking extra measures in his room by putting pads if he fell.  He sleeping good.  His appetite is okay.  However overall he lost weight because sometime he does not eat because he does not like the food.  He denies drinking or using any illegal substances.  Visit Diagnosis:    ICD-9-CM ICD-10-CM   1. Bipolar 1 disorder, mixed, moderate ([name]) 296.62 F31.62 traZODone (DESYREL) 50 MG tablet  2. Bipolar 1 disorder ([name]) 296.7 F31.9 sertraline (ZOLOFT) 100 MG tablet     QUEtiapine (SEROQUEL) 25 MG tablet     lamoTRIgine (LAMICTAL) 150 MG tablet     clonazePAM (KLONOPIN) 0.5 MG tablet    Past Psychiatric History: Reviewed. Patient has significant history of bipolar disorder.  He has multiple psychiatric admission due to decompensation and noncompliance  of medication.  Patient has history of aggression and impulsive behavior.  His last psychiatric admission was in 2005.  Past Medical History:  His primary care physician is Dr. Wende Mott. Past Medical History:  Diagnosis Date  . Anxiety   . Arthritis   . Benign prostate hyperplasia   . Bipolar disorder ([place])   . C2 cervical fracture ([name])    due to pt fall  . Chronic indwelling Foley catheter   . Depression   . Diabetes mellitus type II   . Falls   . Flu   . Hearing aid worn   . Hypertension   . Memory difficulty 08/21/2014  . Memory loss   . MRSA infection (methicillin-resistant Staphylococcus aureus)   . Neuromuscular disorder ([place])    parkinsons  . Parkinson disease ([place])   . Pneumonia   . SIRS (systemic inflammatory response syndrome) ([name])   . UTI (urinary tract infection)     Past Surgical History:  Procedure Laterality Date  . TOTAL HIP ARTHROPLASTY    . VIDEO ASSISTED THORACOSCOPY (VATS)/EMPYEMA  12/16/2012   Procedure: VIDEO ASSISTED THORACOSCOPY (VATS)/EMPYEMA;  Surgeon: Melrose Nakayama, MD;  Location: [place] [facility];  Service: Thoracic;  Laterality: Right;  Marland Kitchen VIDEO BRONCHOSCOPY  12/16/2012   Procedure: VIDEO BRONCHOSCOPY;  Surgeon: Melrose Nakayama, MD;  Location: [place];  Service: Thoracic;  Laterality: Right;    Family Psychiatric History:  Reviewed.  Family History:  Family History  Problem Relation Age of Onset  . Depression Father   . Cancer Mother   . Heart disease Mother     Social History:  Social History   Social History  . Marital status: Divorced    Spouse name: N/A  . Number of children: 2  . Years of education: college 1   Occupational History  . Retired    Social History Main Topics  . Smoking status: Never Smoker  . Smokeless tobacco: Never Used  . Alcohol use No     Comment: occasional  . Drug use: No  . Sexual activity: Not Currently   Other Topics Concern  . None   Social History Narrative   Patient is right handed.    Patient drinks 1 cup of caffeine daily.    Allergies:  Allergies  Allergen Reactions  . Plant Sterols And Stanols Other (See Comments)    ON MAR    Recent Results (from the past 2160 hour(s))  CBC with Differential     Status: Abnormal   Collection Time: 10/19/16  7:47 PM  Result Value Ref Range   WBC 9.3 4.0 - 10.5 K/uL   RBC 4.15 (L) 4.22 - 5.81 MIL/uL   Hemoglobin 12.5 (L) 13.0 - 17.0 g/dL   HCT 37.6 (L) 39.0 - 52.0 %   MCV 90.6 78.0 - 100.0 fL   MCH 30.1 26.0 - 34.0 pg   MCHC 33.2 30.0 - 36.0 g/dL   RDW 14.5 11.5 - 15.5 %   Platelets 243 150 - 400 K/uL   Neutrophils Relative % 75 %   Neutro Abs 7.0 1.7 - 7.7 K/uL   Lymphocytes Relative 16 %   Lymphs Abs 1.5 0.7 - 4.0 K/uL   Monocytes Relative 6 %   Monocytes Absolute 0.6 0.1 - 1.0 K/uL   Eosinophils Relative 3 %   Eosinophils Absolute 0.3 0.0 - 0.7 K/uL   Basophils Relative 0 %   Basophils Absolute 0.0 0.0 - 0.1 K/uL  Basic metabolic panel     Status: Abnormal   Collection Time: 10/19/16  7:47 PM  Result Value Ref Range   Sodium 139 135 - 145 mmol/L   Potassium 4.5 3.5 - 5.1 mmol/L   Chloride 107 101 - 111 mmol/L   CO2 26 22 - 32 mmol/L   Glucose, Bld 104 (H) 65 - 99 mg/dL   BUN 29 (H) 6 - 20 mg/dL   Creatinine, Ser 1.47 (H) 0.61 - 1.24 mg/dL   Calcium 8.8 (L) 8.9 - 10.3 mg/dL   GFR calc non Af Amer 44 (L) >60 mL/min   GFR calc Af Amer 51 (L) >60 mL/min    Comment: (NOTE) The eGFR has been calculated using the CKD EPI equation. This calculation has not been validated in all clinical situations. eGFR's persistently <60 mL/min signify possible Chronic Kidney Disease.    Anion gap 6 5 - 15  Magnesium     Status: None   Collection Time: 10/19/16  7:47 PM  Result Value Ref Range   Magnesium 2.2 1.7 - 2.4 mg/dL  CBC with Differential     Status: Abnormal   Collection Time: 11/29/16  5:12 PM  Result Value Ref Range   WBC 11.4 (H) 4.0 - 10.5 K/uL   RBC 4.01 (L) 4.22 - 5.81 MIL/uL   Hemoglobin 12.0 (L) 13.0 -  17.0 g/dL   HCT 37.2 (L) 39.0 - 52.0 %   MCV 92.8 78.0 -  100.0 fL   MCH 29.9 26.0 - 34.0 pg   MCHC 32.3 30.0 - 36.0 g/dL   RDW 15.2 11.5 - 15.5 %   Platelets 168 150 - 400 K/uL   Neutrophils Relative % 93 %   Neutro Abs 10.6 (H) 1.7 - 7.7 K/uL   Lymphocytes Relative 3 %   Lymphs Abs 0.4 (L) 0.7 - 4.0 K/uL   Monocytes Relative 4 %   Monocytes Absolute 0.4 0.1 - 1.0 K/uL   Eosinophils Relative 0 %   Eosinophils Absolute 0.0 0.0 - 0.7 K/uL   Basophils Relative 0 %   Basophils Absolute 0.0 0.0 - 0.1 K/uL  Basic metabolic panel     Status: Abnormal   Collection Time: 11/29/16  5:12 PM  Result Value Ref Range   Sodium 141 135 - 145 mmol/L   Potassium 4.0 3.5 - 5.1 mmol/L   Chloride 107 101 - 111 mmol/L   CO2 24 22 - 32 mmol/L   Glucose, Bld 131 (H) 65 - 99 mg/dL   BUN 29 (H) 6 - 20 mg/dL   Creatinine, Ser 1.08 0.61 - 1.24 mg/dL   Calcium 8.8 (L) 8.9 - 10.3 mg/dL   GFR calc non Af Amer >60 >60 mL/min   GFR calc Af Amer >60 >60 mL/min    Comment: (NOTE) The eGFR has been calculated using the CKD EPI equation. This calculation has not been validated in all clinical situations. eGFR's persistently <60 mL/min signify possible Chronic Kidney Disease.    Anion gap 10 5 - 15  I-Stat CG4 Lactic Acid, ED     Status: None   Collection Time: 11/29/16  5:25 PM  Result Value Ref Range   Lactic Acid, Venous 0.90 0.5 - 1.9 mmol/L  Urinalysis, Routine w reflex microscopic     Status: Abnormal   Collection Time: 11/29/16  6:05 PM  Result Value Ref Range   Color, Urine AMBER (A) YELLOW    Comment: BIOCHEMICALS MAY BE AFFECTED BY COLOR   APPearance CLOUDY (A) CLEAR   Specific Gravity, Urine 1.020 1.005 - 1.030   pH 8.0 5.0 - 8.0   Glucose, UA NEGATIVE NEGATIVE mg/dL   Hgb urine dipstick NEGATIVE NEGATIVE   Bilirubin Urine NEGATIVE NEGATIVE   Ketones, ur NEGATIVE NEGATIVE mg/dL   Protein, ur 100 (A) NEGATIVE mg/dL   Nitrite NEGATIVE NEGATIVE   Leukocytes, UA TRACE (A) NEGATIVE   RBC /  HPF 0-5 0 - 5 RBC/hpf   WBC, UA 6-30 0 - 5 WBC/hpf   Bacteria, UA MANY (A) NONE SEEN   Squamous Epithelial / LPF 0-5 (A) NONE SEEN   Mucous PRESENT    Budding Yeast PRESENT    Triple Phosphate Crystal PRESENT    Crystals PRESENT (A) NEGATIVE  HIV antibody     Status: None   Collection Time: 11/29/16 10:28 PM  Result Value Ref Range   HIV Screen 4th Generation wRfx Non Reactive Non Reactive    Comment: (NOTE) Performed At: [facility] [address] [place], [place] [id number] Lindon Romp MD HY:[id number]   Strep pneumoniae urinary antigen     Status: None   Collection Time: 11/29/16 10:28 PM  Result Value Ref Range   Strep Pneumo Urinary Antigen NEGATIVE NEGATIVE    Comment:        Infection due to S. pneumoniae cannot be absolutely ruled out since the antigen present may be below the detection limit of the test.   Culture, blood (routine x 2)  Call MD if unable to obtain prior to antibiotics being given     Status: None   Collection Time: 11/29/16 11:00 PM  Result Value Ref Range   Specimen Description BLOOD LEFT ARM    Special Requests BOTTLES DRAWN AEROBIC AND ANAEROBIC 5CC    Culture NO GROWTH 5 DAYS    Report Status 12/05/2016 FINAL   Culture, blood (routine x 2) Call MD if unable to obtain prior to antibiotics being given     Status: None   Collection Time: 11/29/16 11:09 PM  Result Value Ref Range   Specimen Description BLOOD LEFT HAND    Special Requests BOTTLES DRAWN AEROBIC AND ANAEROBIC 5CC    Culture NO GROWTH 5 DAYS    Report Status 12/05/2016 FINAL   MRSA PCR Screening     Status: Abnormal   Collection Time: 11/30/16  1:14 AM  Result Value Ref Range   MRSA by PCR POSITIVE (A) NEGATIVE    Comment:        The GeneXpert MRSA Assay (FDA approved for NASAL specimens only), is one component of a comprehensive MRSA colonization surveillance program. It is not intended to diagnose MRSA infection nor to guide or monitor treatment for MRSA  infections. RESULT CALLED TO, READ BACK BY AND VERIFIED WITHMadelon Lips RN [phone] 11/30/16 A BROWNING   Basic metabolic panel     Status: Abnormal   Collection Time: 12/02/16  5:09 AM  Result Value Ref Range   Sodium 138 135 - 145 mmol/L   Potassium 4.1 3.5 - 5.1 mmol/L   Chloride 102 101 - 111 mmol/L   CO2 28 22 - 32 mmol/L   Glucose, Bld 100 (H) 65 - 99 mg/dL   BUN 21 (H) 6 - 20 mg/dL   Creatinine, Ser 1.19 0.61 - 1.24 mg/dL   Calcium 8.5 (L) 8.9 - 10.3 mg/dL   GFR calc non Af Amer 56 (L) >60 mL/min   GFR calc Af Amer >60 >60 mL/min    Comment: (NOTE) The eGFR has been calculated using the CKD EPI equation. This calculation has not been validated in all clinical situations. eGFR's persistently <60 mL/min signify possible Chronic Kidney Disease.    Anion gap 8 5 - 15  CBC     Status: Abnormal   Collection Time: 12/02/16  5:09 AM  Result Value Ref Range   WBC 5.0 4.0 - 10.5 K/uL   RBC 3.56 (L) 4.22 - 5.81 MIL/uL   Hemoglobin 10.6 (L) 13.0 - 17.0 g/dL   HCT 32.5 (L) 39.0 - 52.0 %   MCV 91.3 78.0 - 100.0 fL   MCH 29.8 26.0 - 34.0 pg   MCHC 32.6 30.0 - 36.0 g/dL   RDW 14.4 11.5 - 15.5 %   Platelets 161 150 - 400 K/uL  RPR     Status: None   Collection Time: 12/07/16  1:01 PM  Result Value Ref Range   RPR Ser Ql Non Reactive Non Reactive  Vitamin B12     Status: None   Collection Time: 12/07/16  1:01 PM  Result Value Ref Range   Vitamin B-12 1,153 232 - 1,245 pg/mL    Comment:               **Please note reference interval change**  Copper, serum     Status: None   Collection Time: 12/07/16  1:01 PM  Result Value Ref Range   Copper 101 72 - 166 ug/dL    Comment:  Detection Limit = 5  Vitamin B1     Status: None   Collection Time: 12/07/16  1:01 PM  Result Value Ref Range   Thiamine 191.6 66.5 - 200.0 nmol/L    Comment: This test was developed and its performance characteristics determined by [facility]. It has not been cleared or  approved by the Food and Drug Administration.   Ammonia     Status: None   Collection Time: 12/07/16  1:01 PM  Result Value Ref Range   Ammonia 27 27 - 102 ug/dL  Urinalysis, Routine w reflex microscopic     Status: Abnormal   Collection Time: 12/12/16 12:10 AM  Result Value Ref Range   Color, Urine YELLOW YELLOW   APPearance HAZY (A) CLEAR   Specific Gravity, Urine 1.008 1.005 - 1.030   pH 6.0 5.0 - 8.0   Glucose, UA NEGATIVE NEGATIVE mg/dL   Hgb urine dipstick NEGATIVE NEGATIVE   Bilirubin Urine NEGATIVE NEGATIVE   Ketones, ur NEGATIVE NEGATIVE mg/dL   Protein, ur NEGATIVE NEGATIVE mg/dL   Nitrite NEGATIVE NEGATIVE   Leukocytes, UA LARGE (A) NEGATIVE   RBC / HPF 6-30 0 - 5 RBC/hpf   WBC, UA TOO NUMEROUS TO COUNT 0 - 5 WBC/hpf   Bacteria, UA RARE (A) NONE SEEN   Squamous Epithelial / LPF NONE SEEN NONE SEEN   WBC Clumps PRESENT    Mucous PRESENT    Ca Oxalate Crys, UA PRESENT   Urine culture     Status: Abnormal   Collection Time: 12/12/16 12:10 AM  Result Value Ref Range   Specimen Description URINE, RANDOM    Special Requests NONE    Culture >=100,000 COLONIES/mL PSEUDOMONAS AERUGINOSA (A)    Report Status 12/15/2016 FINAL    Organism ID, Bacteria PSEUDOMONAS AERUGINOSA (A)       Susceptibility   Pseudomonas aeruginosa - MIC*    CEFTAZIDIME 4 SENSITIVE Sensitive     CIPROFLOXACIN <=0.25 SENSITIVE Sensitive     GENTAMICIN <=1 SENSITIVE Sensitive     IMIPENEM 1 SENSITIVE Sensitive     PIP/TAZO 8 SENSITIVE Sensitive     CEFEPIME 2 SENSITIVE Sensitive     * >=100,000 COLONIES/mL PSEUDOMONAS AERUGINOSA  Metabolic Disorder Labs: Lab Results  Component Value Date   HGBA1C 5.6 09/30/2015   MPG 114 09/30/2015   MPG 111 03/16/2015   No results found for: PROLACTIN No results found for: CHOL, TRIG, HDL, CHOLHDL, VLDL, LDLCALC   Current Medications: Current Outpatient Prescriptions  Medication Sig Dispense Refill  . acetaminophen (TYLENOL) 325 MG tablet Take 650 mg by  mouth every 6 (six) hours as needed for mild pain.    Marland Kitchen amLODipine (NORVASC) 5 MG tablet Take 5 mg by mouth daily.    Marland Kitchen aspirin 81 MG chewable tablet Chew 81 mg by mouth daily.    . bisacodyl (DULCOLAX) 10 MG suppository Place 10 mg rectally as needed for moderate constipation.    . carbidopa-levodopa (SINEMET IR) 25-250 MG per tablet Take 1 tablet by mouth 3 (three) times daily.    . ciprofloxacin (CIPRO) 500 MG tablet Take 1 tablet (500 mg total) by mouth 2 (two) times daily. 14 tablet 0  . clonazePAM (KLONOPIN) 0.5 MG tablet Take 0.5 tablets (0.25 mg total) by mouth daily as needed for anxiety. 30 tablet 1  . clopidogrel (PLAVIX) 75 MG tablet Take 1 tablet (75 mg total) by mouth daily. (Patient taking differently: Take 75 mg by mouth every evening. ) 30 tablet 0  .  entacapone (COMTAN) 200 MG tablet Take 200 mg by mouth 3 (three) times daily.    . feeding supplement, GLUCERNA SHAKE, (GLUCERNA SHAKE) LIQD Take 237 mLs by mouth 2 (two) times daily between meals. 30 Can 0  . ferrous sulfate 325 (65 FE) MG tablet Take 325 mg by mouth 2 (two) times daily.    Marland Kitchen guaifenesin (ROBITUSSIN) 100 MG/5ML syrup Take 200 mg by mouth 3 (three) times daily as needed for cough.    . hydrALAZINE (APRESOLINE) 25 MG tablet Take 25 mg by mouth 2 (two) times daily.     . isosorbide mononitrate (IMDUR) 30 MG 24 hr tablet Take 1 tablet (30 mg total) by mouth daily. 30 tablet 0  . lamoTRIgine (LAMICTAL) 150 MG tablet Take 1 tablet (150 mg total) by mouth 2 (two) times daily. 60 tablet 2  . lisinopril (PRINIVIL,ZESTRIL) 10 MG tablet Take 0.5 tablets (5 mg total) by mouth daily. 30 tablet 0  . loratadine (CLARITIN) 10 MG tablet Take 10 mg by mouth daily as needed for allergies.    . Maltodextrin-Xanthan Gum (RESOURCE THICKENUP CLEAR) POWD Take 120 g by mouth as needed. (Patient taking differently: Take 1 Can by mouth daily as needed (takes '120mg'$  as needed food thickener). ) 30 Can 0  . Multiple Vitamin (MULTIVITAMIN WITH  MINERALS) TABS tablet Take 1 tablet by mouth daily.    . pantoprazole (PROTONIX) 40 MG tablet Take 40 mg by mouth daily. Reported on 07/06/2016    . polyethylene glycol (MIRALAX / GLYCOLAX) packet Take 17 g by mouth daily.    . predniSONE (DELTASONE) 2.5 MG tablet Take 2.5 mg by mouth daily with breakfast.    . QUEtiapine (SEROQUEL) 25 MG tablet Take 3 tablets (75 mg total) by mouth at bedtime. 90 tablet 2  . rosuvastatin (CRESTOR) 20 MG tablet Take 20 mg by mouth daily.    . sertraline (ZOLOFT) 100 MG tablet Take 2 tablets (200 mg total) by mouth daily. 60 tablet 2  . Skin Protectants, Misc. (BAZA PROTECT EX) Apply 1 application topically 2 (two) times daily.     . traZODone (DESYREL) 50 MG tablet Take 1 tablet (50 mg total) by mouth at bedtime. 30 tablet 2   No current facility-administered medications for this visit.     Neurologic: Headache: No Seizure: No Paresthesias: No  Musculoskeletal: Strength & Muscle Tone: decreased Gait & Station: unsteady, unable to stand Patient leans: N/A  Psychiatric Specialty Exam: Review of Systems  Constitutional: Positive for malaise/fatigue and weight loss.  HENT: Positive for hearing loss.   Eyes: Negative.   Respiratory: Negative.   Cardiovascular: Negative.   Musculoskeletal: Positive for back pain.       Unable to walk due to leg weakness  Skin: Negative.  Negative for itching.  Neurological: Negative for tremors and headaches.  Psychiatric/Behavioral: Positive for memory loss.    Blood pressure 128/78, pulse 81, height '5\' 10"'$  (1.778 m), weight 147 lb (66.7 kg).Body mass index is 21.09 kg/m.  General Appearance: Casual  Eye Contact:  Fair  Speech:  Slow  Volume:  Normal  Mood:  Euthymic  Affect:  Congruent  Thought Process:  Descriptions of Associations: Circumstantial  Orientation:  Full (Time, Place, and Person)  Thought Content: Logical   Suicidal Thoughts:  No  Homicidal Thoughts:  No  Memory:  Immediate;   Fair Recent;    Fair Remote;   Fair  Judgement:  Good  Insight:  Good  Psychomotor Activity:  Decreased  Concentration:  Concentration: Fair and Attention Span: Fair  Recall:  [employer] of Knowledge: Fair  Language: Good  Akathisia:  No  Handed:  Right  AIMS (if indicated):  0  Assets:  Communication Skills Desire for Improvement Housing  ADL's:  Intact  Cognition: Impaired,  Mild  Sleep:  fair   Assessment: Bipolar disorder type I.  Cognitive disorder NOS.  Plan: I review his symptoms, current medication, discharge summary and recent blood work results.  On December 16 his glucoses 100, BUN 21 and creatinine 1.19.  His depression is a stable.  I recommended to cut down further Klonopin as patient has been falling.  He is very reluctant to cut down his other psychiatric medication.  I will continue trazodone 50 mg at bedtime, Lamictal 150 mg twice a day, Seroquel 75 mg daily and we will reduce Klonopin 0.25 mg daily as needed.  Discussed medication side effects and benefits.  Recommended to call us back if he has any question, concern if he feel worsening of the symptom.  [facility].  Discuss safety plan that anytime having active suicidal thoughts or homicidal thoughts and he need to call 911 or go to the local emergency room.  Follow-up in 6 month.    ARFEEN,SYED T., MD 01/17/2017, 11:05 AM

## 2017-01-22 ENCOUNTER — Ambulatory Visit: Payer: Medicare Other | Admitting: Podiatry

## 2017-01-27 ENCOUNTER — Emergency Department (HOSPITAL_COMMUNITY)
Admission: EM | Admit: 2017-01-27 | Discharge: 2017-01-27 | Disposition: A | Discharge status: Home / Self Care | Payer: Medicare Other | Attending: Emergency Medicine | Admitting: Emergency Medicine

## 2017-01-27 ENCOUNTER — Encounter (HOSPITAL_COMMUNITY): Payer: Self-pay | Admitting: Emergency Medicine

## 2017-01-27 ENCOUNTER — Emergency Department (HOSPITAL_COMMUNITY): Payer: Medicare Other

## 2017-01-27 DIAGNOSIS — Z79899 Other long term (current) drug therapy: Secondary | ICD-10-CM | POA: Insufficient documentation

## 2017-01-27 DIAGNOSIS — Y929 Unspecified place or not applicable: Secondary | ICD-10-CM | POA: Insufficient documentation

## 2017-01-27 DIAGNOSIS — Z96649 Presence of unspecified artificial hip joint: Secondary | ICD-10-CM | POA: Insufficient documentation

## 2017-01-27 DIAGNOSIS — I110 Hypertensive heart disease with heart failure: Secondary | ICD-10-CM | POA: Insufficient documentation

## 2017-01-27 DIAGNOSIS — G20 Parkinson's disease: Secondary | ICD-10-CM | POA: Insufficient documentation

## 2017-01-27 DIAGNOSIS — W19XXXA Unspecified fall, initial encounter: Secondary | ICD-10-CM | POA: Insufficient documentation

## 2017-01-27 DIAGNOSIS — Z7902 Long term (current) use of antithrombotics/antiplatelets: Secondary | ICD-10-CM | POA: Insufficient documentation

## 2017-01-27 DIAGNOSIS — E119 Type 2 diabetes mellitus without complications: Secondary | ICD-10-CM | POA: Insufficient documentation

## 2017-01-27 DIAGNOSIS — Y939 Activity, unspecified: Secondary | ICD-10-CM | POA: Insufficient documentation

## 2017-01-27 DIAGNOSIS — S0083XA Contusion of other part of head, initial encounter: Secondary | ICD-10-CM | POA: Insufficient documentation

## 2017-01-27 DIAGNOSIS — S61512A Laceration without foreign body of left wrist, initial encounter: Secondary | ICD-10-CM | POA: Insufficient documentation

## 2017-01-27 DIAGNOSIS — I5032 Chronic diastolic (congestive) heart failure: Secondary | ICD-10-CM | POA: Insufficient documentation

## 2017-01-27 DIAGNOSIS — S51812A Laceration without foreign body of left forearm, initial encounter: Secondary | ICD-10-CM | POA: Insufficient documentation

## 2017-01-27 DIAGNOSIS — Y999 Unspecified external cause status: Secondary | ICD-10-CM | POA: Insufficient documentation

## 2017-01-27 DIAGNOSIS — F0390 Unspecified dementia without behavioral disturbance: Secondary | ICD-10-CM | POA: Insufficient documentation

## 2017-01-27 DIAGNOSIS — Z7982 Long term (current) use of aspirin: Secondary | ICD-10-CM | POA: Insufficient documentation

## 2017-01-27 NOTE — ED Notes (Signed)
[facility] here to transport pt back to [facility].

## 2017-01-27 NOTE — ED Provider Notes (Signed)
[facility] DEPT Provider Note   CSN: [id number] Arrival date & time: 01/27/17  0224  By signing my name below, I, [name], attest that this documentation has been prepared under the direction and in the presence of Dwayne Tally, MD. Electronically Signed: Hansel Carpenter, ED Scribe. 01/27/17. 2:49 AM.     History   Chief Complaint Chief Complaint  Patient presents with  . Fall  . Skin Injury   LEVEL 5 CAVEAT: HPI and ROS limited due to dementia    HPI [name] is a 82 y.o. male brought in by ambulance who presents to the Emergency Department from the [facility] for evaluation of injuries s/p unwitnessed fall that occurred tonight. Per nursing note, pt was found lying on the floor beside his bed with a skin tear on his left forearm. Pt states he does not remember falling. Bleeding controlled with dressing applied by EMS PTA.    The history is provided by the EMS personnel and medical records. History limited by: dementia. No language interpreter was used.  Fall  This is a new problem. The current episode started 1 to 2 hours ago. The problem has not changed since onset.Pertinent negatives include no chest pain. Nothing aggravates the symptoms. Nothing relieves the symptoms. He has tried nothing for the symptoms. The treatment provided no relief.    Past Medical History:  Diagnosis Date  . Anxiety   . Arthritis   . Benign prostate hyperplasia   . Bipolar disorder ([place])   . C2 cervical fracture ([name])    due to pt fall  . Chronic indwelling Foley catheter   . Depression   . Diabetes mellitus type II   . Falls   . Flu   . Hearing aid worn   . Hypertension   . Memory difficulty 08/21/2014  . Memory loss   . MRSA infection (methicillin-resistant Staphylococcus aureus)   . Neuromuscular disorder ([place])    parkinsons  . Parkinson disease ([place])   . Pneumonia   . SIRS (systemic inflammatory response syndrome) ([name])   . UTI (urinary tract infection)     Patient  Active Problem List   Diagnosis Date Noted  . CAP (community acquired pneumonia) 11/29/2016  . Bacteremia 03/14/2016  . HCAP (healthcare-associated pneumonia)   . Pressure ulcer 10/01/2015  . Right lower lobe pneumonia ([place]) 10/01/2015  . Sepsis due to Gram-negative organism with acute respiratory failure ([place]) 10/01/2015  . Acute renal failure ([place]) 10/01/2015  . Acute respiratory failure with hypoxia ([place]) 09/30/2015  . Influenza A (H1N1) 03/25/2015  . Dementia without behavioral disturbance 03/25/2015  . Hyperkalemia 03/25/2015  . Fall 03/16/2015  . Sacral pressure ulcer 03/16/2015  . Fever 03/16/2015  . Memory difficulty 08/21/2014  . Malnutrition of moderate degree ([place]) 04/19/2014  . UTI (lower urinary tract infection) 04/18/2014  . Hypoxia 04/18/2014  . PNA (pneumonia) 04/18/2014  . Chronic diastolic congestive heart failure ([place]) 02/01/2013  . Pericardial effusion 02/01/2013  . Bradycardia 01/31/2013  . Empyema lung ([place]) 12/23/2012  . S/P thoracotomy 12/23/2012  . Abnormality of gait 12/05/2012  . Hypernatremia 08/13/2012  . Hypertension   . Diabetes mellitus, type 2 ([place])   . Arthritis   . Bipolar disorder ([place])   . Anxiety   . Chronic indwelling Foley catheter   . Neuromuscular disorder ([place])   . Bacteremia of undetermined etiology 06/17/2012  . Parkinson disease ([place]) 04/16/2012  . Elevated troponin 04/16/2012  . Hearing aid worn   . Diabetes mellitus ([place])  07/06/2011    Past Surgical History:  Procedure Laterality Date  . TOTAL HIP ARTHROPLASTY    . VIDEO ASSISTED THORACOSCOPY (VATS)/EMPYEMA  12/16/2012   Procedure: VIDEO ASSISTED THORACOSCOPY (VATS)/EMPYEMA;  Surgeon: Melrose Nakayama, MD;  Location: [facility];  Service: Thoracic;  Laterality: Right;  Marland Kitchen VIDEO BRONCHOSCOPY  12/16/2012   Procedure: VIDEO BRONCHOSCOPY;  Surgeon: Melrose Nakayama, MD;  Location: [facility];  Service: Thoracic;  Laterality: Right;       Home Medications    Prior to  Admission medications   Medication Sig Start Date End Date Taking? Authorizing Provider  acetaminophen (TYLENOL) 325 MG tablet Take 650 mg by mouth every 6 (six) hours as needed for mild pain.    Historical Provider, MD  amLODipine (NORVASC) 5 MG tablet Take 5 mg by mouth daily.    Historical Provider, MD  aspirin 81 MG chewable tablet Chew 81 mg by mouth daily.    Historical Provider, MD  bisacodyl (DULCOLAX) 10 MG suppository Place 10 mg rectally as needed for moderate constipation.    Historical Provider, MD  carbidopa-levodopa (SINEMET IR) 25-250 MG per tablet Take 1 tablet by mouth 3 (three) times daily.    Historical Provider, MD  ciprofloxacin (CIPRO) 500 MG tablet Take 1 tablet (500 mg total) by mouth 2 (two) times daily. 12/12/16   Ezequiel Essex, MD  clonazePAM (KLONOPIN) 0.5 MG tablet Take 0.5 tablets (0.25 mg total) by mouth daily as needed for anxiety. 01/17/17   Kathlee Nations, MD  clopidogrel (PLAVIX) 75 MG tablet Take 1 tablet (75 mg total) by mouth daily. Patient taking differently: Take 75 mg by mouth every evening.  10/07/15   Florencia Reasons, MD  entacapone (COMTAN) 200 MG tablet Take 200 mg by mouth 3 (three) times daily.    Historical Provider, MD  feeding supplement, GLUCERNA SHAKE, (GLUCERNA SHAKE) LIQD Take 237 mLs by mouth 2 (two) times daily between meals. 03/16/16   Kelvin Cellar, MD  ferrous sulfate 325 (65 FE) MG tablet Take 325 mg by mouth 2 (two) times daily.    Historical Provider, MD  guaifenesin (ROBITUSSIN) 100 MG/5ML syrup Take 200 mg by mouth 3 (three) times daily as needed for cough.    Historical Provider, MD  hydrALAZINE (APRESOLINE) 25 MG tablet Take 25 mg by mouth 2 (two) times daily.     Historical Provider, MD  isosorbide mononitrate (IMDUR) 30 MG 24 hr tablet Take 1 tablet (30 mg total) by mouth daily. 10/07/15   Florencia Reasons, MD  lamoTRIgine (LAMICTAL) 150 MG tablet Take 1 tablet (150 mg total) by mouth 2 (two) times daily. 01/17/17   Kathlee Nations, MD  lisinopril  (PRINIVIL,ZESTRIL) 10 MG tablet Take 0.5 tablets (5 mg total) by mouth daily. 10/07/15   Florencia Reasons, MD  loratadine (CLARITIN) 10 MG tablet Take 10 mg by mouth daily as needed for allergies.    Historical Provider, MD  Maltodextrin-Xanthan Gum (RESOURCE THICKENUP CLEAR) POWD Take 120 g by mouth as needed. Patient taking differently: Take 1 Can by mouth daily as needed (takes 120mg  as needed food thickener).  10/07/15   Florencia Reasons, MD  Multiple Vitamin (MULTIVITAMIN WITH MINERALS) TABS tablet Take 1 tablet by mouth daily.    Historical Provider, MD  pantoprazole (PROTONIX) 40 MG tablet Take 40 mg by mouth daily. Reported on 07/06/2016    Historical Provider, MD  polyethylene glycol (MIRALAX / GLYCOLAX) packet Take 17 g by mouth daily.    Historical Provider, MD  predniSONE (  DELTASONE) 2.5 MG tablet Take 2.5 mg by mouth daily with breakfast.    Historical Provider, MD  QUEtiapine (SEROQUEL) 25 MG tablet Take 3 tablets (75 mg total) by mouth at bedtime. 01/17/17   Kathlee Nations, MD  rosuvastatin (CRESTOR) 20 MG tablet Take 20 mg by mouth daily.    Historical Provider, MD  sertraline (ZOLOFT) 100 MG tablet Take 2 tablets (200 mg total) by mouth daily. 01/17/17   Kathlee Nations, MD  Skin Protectants, Misc. (BAZA PROTECT EX) Apply 1 application topically 2 (two) times daily.     Historical Provider, MD  traZODone (DESYREL) 50 MG tablet Take 1 tablet (50 mg total) by mouth at bedtime. 01/17/17   Kathlee Nations, MD    Family History Family History  Problem Relation Age of Onset  . Depression Father   . Cancer Mother   . Heart disease Mother     Social History Social History  Substance Use Topics  . Smoking status: Never Smoker  . Smokeless tobacco: Never Used  . Alcohol use No     Comment: occasional     Allergies   Plant sterols and stanols   Review of Systems Review of Systems  Unable to perform ROS: Dementia  Cardiovascular: Negative for chest pain.     Physical Exam Updated Vital  Signs BP 155/74 (BP Location: Left Arm)   Pulse 62   Temp 98.1 F (36.7 C) (Oral)   Resp 13   SpO2 96%   Physical Exam  Constitutional: He appears well-developed and well-nourished.  HENT:  Head: Normocephalic. Head is without raccoon's eyes, without Battle's sign and without abrasion.  Right Ear: External ear normal.  Left Ear: External ear normal.  Mouth/Throat: Oropharynx is clear and moist. No oropharyngeal exudate.  Moist mucous membranes. No exudates. No battle's sign no raccoon eyes. No hemotympanum bilaterally. Has old green bruising on his forehead from a previous fall, approximately 39 days old.   Eyes: Conjunctivae and EOM are normal. Pupils are equal, round, and reactive to light.  Neck: Normal range of motion. Neck supple. No JVD present. No tracheal deviation present.  No carotid bruits. Trachea midline.   Cardiovascular: Normal rate, regular rhythm, normal heart sounds and intact distal pulses.  Exam reveals no gallop and no friction rub.   No murmur heard. RRR.   Pulmonary/Chest: Effort normal and breath sounds normal. No stridor. No respiratory distress. He has no wheezes. He has no rales.  Lungs CTA bilaterally.   Abdominal: Soft. Bowel sounds are normal. He exhibits no distension and no mass. There is no tenderness. There is no rebound and no guarding.  Musculoskeletal: Normal range of motion.  Old scabs on his forearm from a previous fall. 1.5 hemostatic skin tear to the left forearm. No step off or crepitus of his neck. Pelvis is stable.   Lymphadenopathy:    He has no cervical adenopathy.  Neurological: He is alert. He has normal reflexes. He displays normal reflexes.  Skin: Skin is warm and dry. Capillary refill takes less than 2 seconds.  1 cm skin tear left lateral wrist hemostatic  Psychiatric: He has a normal mood and affect.  Nursing note and vitals reviewed.    ED Treatments / Results   Vitals:   01/27/17 0238  BP: 155/74  Pulse: 62  Resp: 13   Temp: 98.1 F (36.7 C)    DIAGNOSTIC STUDIES: Oxygen Saturation is 96% on RA, adequate by my interpretation.    Results for  orders placed or performed during the hospital encounter of 12/11/16  Urine culture  Result Value Ref Range   Specimen Description URINE, RANDOM    Special Requests NONE    Culture >=100,000 COLONIES/mL PSEUDOMONAS AERUGINOSA (A)    Report Status 12/15/2016 FINAL    Organism ID, Bacteria PSEUDOMONAS AERUGINOSA (A)       Susceptibility   Pseudomonas aeruginosa - MIC*    CEFTAZIDIME 4 SENSITIVE Sensitive     CIPROFLOXACIN <=0.25 SENSITIVE Sensitive     GENTAMICIN <=1 SENSITIVE Sensitive     IMIPENEM 1 SENSITIVE Sensitive     PIP/TAZO 8 SENSITIVE Sensitive     CEFEPIME 2 SENSITIVE Sensitive     * >=100,000 COLONIES/mL PSEUDOMONAS AERUGINOSA  Urinalysis, Routine w reflex microscopic  Result Value Ref Range   Color, Urine YELLOW YELLOW   APPearance HAZY (A) CLEAR   Specific Gravity, Urine 1.008 1.005 - 1.030   pH 6.0 5.0 - 8.0   Glucose, UA NEGATIVE NEGATIVE mg/dL   Hgb urine dipstick NEGATIVE NEGATIVE   Bilirubin Urine NEGATIVE NEGATIVE   Ketones, ur NEGATIVE NEGATIVE mg/dL   Protein, ur NEGATIVE NEGATIVE mg/dL   Nitrite NEGATIVE NEGATIVE   Leukocytes, UA LARGE (A) NEGATIVE   RBC / HPF 6-30 0 - 5 RBC/hpf   WBC, UA TOO NUMEROUS TO COUNT 0 - 5 WBC/hpf   Bacteria, UA RARE (A) NONE SEEN   Squamous Epithelial / LPF NONE SEEN NONE SEEN   WBC Clumps PRESENT    Mucous PRESENT    Ca Oxalate Crys, UA PRESENT    Ct Head Wo Contrast  Result Date: 01/27/2017 CLINICAL DATA:  Unwitnessed fall. EXAM: CT HEAD WITHOUT CONTRAST CT CERVICAL SPINE WITHOUT CONTRAST TECHNIQUE: Multidetector CT imaging of the head and cervical spine was performed following the standard protocol without intravenous contrast. Multiplanar CT image reconstructions of the cervical spine were also generated. COMPARISON:  CT of the head and cervical spine dated 12/13/2015. FINDINGS: CT HEAD  FINDINGS Brain: No evidence of acute infarction, hemorrhage, hydrocephalus, extra-axial collection or mass lesion/mass effect. Stable mild chronic microvascular ischemic changes and parenchymal volume loss of the brain. Stable prominent right paramedian cerebellar extra-axial space likely representing an arachnoid cyst. Vascular: Extensive calcific atherosclerosis of the vertebral artery is and cavernous segments of internal carotid arteries. Skull: Normal. Negative for fracture or focal lesion. Sinuses/Orbits: No acute finding. Other: None. CT CERVICAL SPINE FINDINGS Alignment: Straightening of cervical lordosis with slight reversal from the C4 through C6 levels. No listhesis. Skull base and vertebrae: No acute fracture. No primary bone lesion or focal pathologic process. Soft tissues and spinal canal: No prevertebral fluid or swelling. No visible canal hematoma. Disc levels: Moderate cervical spondylosis with discogenic degenerative changes greatest at the C5 through C7 levels and multilevel facet arthropathy greatest on the right at C2-3 on the left at C3-4. No high-grade bony canal stenosis. Upper chest: Negative. Other: Moderate calcific atherosclerosis of the carotid bifurcations bilaterally. Left thyroid lobe calcification. IMPRESSION: 1. No acute intracranial abnormality. 2. Stable mild chronic microvascular ischemic changes and parenchymal volume loss of the brain. 3. 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 4. No acute fracture or dislocation of the cervical spine. Electronically Signed   By: Kristine Garbe M.D.   On: 01/27/2017 03:37   Ct Cervical Spine Wo Contrast  Result Date: 01/27/2017 CLINICAL DATA:  Unwitnessed fall. EXAM: CT HEAD WITHOUT CONTRAST CT CERVICAL SPINE WITHOUT CONTRAST TECHNIQUE: Multidetector CT imaging of the head and cervical spine  was performed following the standard protocol without intravenous contrast. Multiplanar  CT image reconstructions of the cervical spine were also generated. COMPARISON:  CT of the head and cervical spine dated 12/13/2015. FINDINGS: CT HEAD FINDINGS Brain: No evidence of acute infarction, hemorrhage, hydrocephalus, extra-axial collection or mass lesion/mass effect. Stable mild chronic microvascular ischemic changes and parenchymal volume loss of the brain. Stable prominent right paramedian cerebellar extra-axial space likely representing an arachnoid cyst. Vascular: Extensive calcific atherosclerosis of the vertebral artery is and cavernous segments of internal carotid arteries. Skull: Normal. Negative for fracture or focal lesion. Sinuses/Orbits: No acute finding. Other: None. CT CERVICAL SPINE FINDINGS Alignment: Straightening of cervical lordosis with slight reversal from the C4 through C6 levels. No listhesis. Skull base and vertebrae: No acute fracture. No primary bone lesion or focal pathologic process. Soft tissues and spinal canal: No prevertebral fluid or swelling. No visible canal hematoma. Disc levels: Moderate cervical spondylosis with discogenic degenerative changes greatest at the C5 through C7 levels and multilevel facet arthropathy greatest on the right at C2-3 on the left at C3-4. No high-grade bony canal stenosis. Upper chest: Negative. Other: Moderate calcific atherosclerosis of the carotid bifurcations bilaterally. Left thyroid lobe calcification. IMPRESSION: 1. No acute intracranial abnormality. 2. Stable mild chronic microvascular ischemic changes and parenchymal volume loss of the brain. 3. 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 4. No acute fracture or dislocation of the cervical spine. Electronically Signed   By: Kristine Garbe M.D.   On: 01/27/2017 03:37   Dg Hips Bilat With Pelvis Min 5 Views  Result Date: 01/27/2017 CLINICAL DATA:  Unwitnessed fall EXAM: DG HIP (WITH OR WITHOUT PELVIS) 5+V BILAT COMPARISON:   None. FINDINGS: The SI joints are symmetric. Pubic symphysis appears intact. The patient is status post bilateral hip replacements with grossly intact hardware. No acute fracture or dislocation. IMPRESSION: Status post bilateral hip replacements. No acute osseous abnormality. Electronically Signed   By: Donavan Foil M.D.   On: 01/27/2017 03:39    COORDINATION OF CARE: 2:45 AM Will perform wound care    Procedures Procedures (including critical care time)     Final Clinical Impressions(s) / ED Diagnoses  Fall: All questions answered to patient's satisfaction. Based on history and exam patient has been appropriately medically screened and emergency conditions excluded. Patient is stable for discharge at this time. Strict return precautions given for any further episodes, persistent fever, weakness or any concerns.  I personally performed the services described in this documentation, which was scribed in my presence. The recorded information has been reviewed and is accurate.      Veatrice Kells, MD 01/27/17 [phone]

## 2017-01-27 NOTE — ED Triage Notes (Signed)
Brought in by EMS from [facility] for further evaluation after his unwitnessed fall this morning.  Pt was found lying on floor beside his bed.  Pt sustained skin tear on his left forearm, bleeding controlled with pressure dressing applied by EMS.  Pt denied hitting head or loss of consciousness--- pt able to use his call bell to summon help from staff

## 2017-01-27 NOTE — ED Notes (Signed)
Pt transported to CT ?

## 2017-01-27 NOTE — ED Notes (Signed)
[facility] staff was called and was notified of pt's discharge; discharge instructions provided.

## 2017-01-27 NOTE — ED Notes (Signed)
Bed: WHALB Expected date:  Expected time:  Means of arrival:  Comments: 

## 2017-01-27 NOTE — ED Notes (Signed)
Bed: WA12 Expected date:  Expected time:  Means of arrival:  Comments: EMS 

## 2017-01-27 NOTE — ED Notes (Signed)
[facility] was called and notified of pt's need of transportation back to [facility].

## 2017-02-06 ENCOUNTER — Emergency Department (HOSPITAL_COMMUNITY)
Admission: EM | Admit: 2017-02-06 | Discharge: 2017-02-07 | Disposition: A | Discharge status: Home / Self Care | Payer: Medicare Other | Attending: Emergency Medicine | Admitting: Emergency Medicine

## 2017-02-06 DIAGNOSIS — Y939 Activity, unspecified: Secondary | ICD-10-CM | POA: Insufficient documentation

## 2017-02-06 DIAGNOSIS — E119 Type 2 diabetes mellitus without complications: Secondary | ICD-10-CM | POA: Insufficient documentation

## 2017-02-06 DIAGNOSIS — Y999 Unspecified external cause status: Secondary | ICD-10-CM | POA: Insufficient documentation

## 2017-02-06 DIAGNOSIS — S0081XA Abrasion of other part of head, initial encounter: Secondary | ICD-10-CM | POA: Insufficient documentation

## 2017-02-06 DIAGNOSIS — Y929 Unspecified place or not applicable: Secondary | ICD-10-CM | POA: Insufficient documentation

## 2017-02-06 DIAGNOSIS — I5032 Chronic diastolic (congestive) heart failure: Secondary | ICD-10-CM | POA: Insufficient documentation

## 2017-02-06 DIAGNOSIS — Z982 Presence of cerebrospinal fluid drainage device: Secondary | ICD-10-CM | POA: Insufficient documentation

## 2017-02-06 DIAGNOSIS — W19XXXA Unspecified fall, initial encounter: Secondary | ICD-10-CM

## 2017-02-06 DIAGNOSIS — Z96649 Presence of unspecified artificial hip joint: Secondary | ICD-10-CM | POA: Insufficient documentation

## 2017-02-06 DIAGNOSIS — I110 Hypertensive heart disease with heart failure: Secondary | ICD-10-CM | POA: Insufficient documentation

## 2017-02-06 NOTE — ED Notes (Signed)
Bed: WA02 Expected date:  Expected time:  Means of arrival:  Comments: 82 yr old, fall, no complaints

## 2017-02-06 NOTE — ED Triage Notes (Addendum)
Pt presents from [facility] after falling, obtaining an abrasion to the L side of his head and a skin tear to his L arm. Bleeding controlled. Pt is on Plavix. No LOC. Alert and oriented at baseline

## 2017-02-07 ENCOUNTER — Encounter (HOSPITAL_COMMUNITY): Payer: Self-pay | Admitting: Emergency Medicine

## 2017-02-07 ENCOUNTER — Emergency Department (HOSPITAL_COMMUNITY): Payer: Medicare Other

## 2017-02-07 NOTE — ED Provider Notes (Signed)
[name] DEPT Provider Note   CSN: [id number] Arrival date & time: 02/06/17  2351   By signing my name below, I, [name], attest that this documentation has been prepared under the direction and in the presence of Eyvette Cordon, MD  Electronically Signed: Delton Carpenter, ED Scribe. 02/07/17. 1:53 AM.   History   Chief Complaint Chief Complaint  Patient presents with  . Fall   The history is provided by the patient and the EMS personnel. The history is limited by the condition of the patient. No language interpreter was used.  Fall  This is a new problem. The current episode started 3 to 5 hours ago. The problem has not changed since onset.Pertinent negatives include no chest pain and no shortness of breath. Nothing aggravates the symptoms. Nothing relieves the symptoms. He has tried nothing for the symptoms. The treatment provided no relief.   HPI Comments: LEVEL 5 CAVEAT DUE TO PSYCHIATRIC DISORDER  [name] is a 82 y.o. male who presents to the Emergency Department with an abrasion to his left temple s/p a fall which occurred PTA. Pt is on Plavix. No alleviating factors or modifying factors noted. No other associated symptoms noted.    Past Medical History:  Diagnosis Date  . Anxiety   . Arthritis   . Benign prostate hyperplasia   . Bipolar disorder ([place])   . C2 cervical fracture ([name])    due to pt fall  . Chronic indwelling Foley catheter   . Depression   . Diabetes mellitus type II   . Falls   . Flu   . Hearing aid worn   . Hypertension   . Memory difficulty 08/21/2014  . Memory loss   . MRSA infection (methicillin-resistant Staphylococcus aureus)   . Neuromuscular disorder ([place])    parkinsons  . Parkinson disease ([place])   . Pneumonia   . SIRS (systemic inflammatory response syndrome) ([name])   . UTI (urinary tract infection)     Patient Active Problem List   Diagnosis Date Noted  . CAP (community acquired pneumonia) 11/29/2016  . Bacteremia  03/14/2016  . HCAP (healthcare-associated pneumonia)   . Pressure ulcer 10/01/2015  . Right lower lobe pneumonia ([place]) 10/01/2015  . Sepsis due to Gram-negative organism with acute respiratory failure ([place]) 10/01/2015  . Acute renal failure ([place]) 10/01/2015  . Acute respiratory failure with hypoxia ([place]) 09/30/2015  . Influenza A (H1N1) 03/25/2015  . Dementia without behavioral disturbance 03/25/2015  . Hyperkalemia 03/25/2015  . Fall 03/16/2015  . Sacral pressure ulcer 03/16/2015  . Fever 03/16/2015  . Memory difficulty 08/21/2014  . Malnutrition of moderate degree ([place]) 04/19/2014  . UTI (lower urinary tract infection) 04/18/2014  . Hypoxia 04/18/2014  . PNA (pneumonia) 04/18/2014  . Chronic diastolic congestive heart failure ([place]) 02/01/2013  . Pericardial effusion 02/01/2013  . Bradycardia 01/31/2013  . Empyema lung ([place]) 12/23/2012  . S/P thoracotomy 12/23/2012  . Abnormality of gait 12/05/2012  . Hypernatremia 08/13/2012  . Hypertension   . Diabetes mellitus, type 2 ([place])   . Arthritis   . Bipolar disorder ([place])   . Anxiety   . Chronic indwelling Foley catheter   . Neuromuscular disorder ([place])   . Bacteremia of undetermined etiology 06/17/2012  . Parkinson disease ([place]) 04/16/2012  . Elevated troponin 04/16/2012  . Hearing aid worn   . Diabetes mellitus ([place]) 07/06/2011    Past Surgical History:  Procedure Laterality Date  . TOTAL HIP ARTHROPLASTY    . VIDEO ASSISTED THORACOSCOPY (  VATS)/EMPYEMA  12/16/2012   Procedure: VIDEO ASSISTED THORACOSCOPY (VATS)/EMPYEMA;  Surgeon: Melrose Nakayama, MD;  Location: [facility];  Service: Thoracic;  Laterality: Right;  Marland Kitchen VIDEO BRONCHOSCOPY  12/16/2012   Procedure: VIDEO BRONCHOSCOPY;  Surgeon: Melrose Nakayama, MD;  Location: [facility];  Service: Thoracic;  Laterality: Right;       Home Medications    Prior to Admission medications   Medication Sig Start Date End Date Taking? Authorizing Provider  acetaminophen  (TYLENOL) 325 MG tablet Take 650 mg by mouth every 6 (six) hours as needed for mild pain.    Historical Provider, MD  amLODipine (NORVASC) 5 MG tablet Take 5 mg by mouth daily.    Historical Provider, MD  aspirin 81 MG chewable tablet Chew 81 mg by mouth daily.    Historical Provider, MD  bisacodyl (DULCOLAX) 10 MG suppository Place 10 mg rectally as needed for moderate constipation.    Historical Provider, MD  carbidopa-levodopa (SINEMET IR) 25-250 MG per tablet Take 1 tablet by mouth 3 (three) times daily.    Historical Provider, MD  ciprofloxacin (CIPRO) 500 MG tablet Take 1 tablet (500 mg total) by mouth 2 (two) times daily. Patient not taking: Reported on 01/27/2017 12/12/16   Ezequiel Essex, MD  clonazePAM (KLONOPIN) 0.5 MG tablet Take 0.5 tablets (0.25 mg total) by mouth daily as needed for anxiety. 01/17/17   Kathlee Nations, MD  clopidogrel (PLAVIX) 75 MG tablet Take 1 tablet (75 mg total) by mouth daily. Patient taking differently: Take 75 mg by mouth every evening.  10/07/15   Florencia Reasons, MD  entacapone (COMTAN) 200 MG tablet Take 200 mg by mouth 3 (three) times daily.    Historical Provider, MD  feeding supplement, GLUCERNA SHAKE, (GLUCERNA SHAKE) LIQD Take 237 mLs by mouth 2 (two) times daily between meals. 03/16/16   Kelvin Cellar, MD  ferrous sulfate 325 (65 FE) MG tablet Take 325 mg by mouth 2 (two) times daily.    Historical Provider, MD  guaifenesin (ROBITUSSIN) 100 MG/5ML syrup Take 200 mg by mouth 3 (three) times daily as needed for cough.    Historical Provider, MD  hydrALAZINE (APRESOLINE) 25 MG tablet Take 25 mg by mouth 2 (two) times daily.     Historical Provider, MD  isosorbide mononitrate (IMDUR) 30 MG 24 hr tablet Take 1 tablet (30 mg total) by mouth daily. 10/07/15   Florencia Reasons, MD  lamoTRIgine (LAMICTAL) 150 MG tablet Take 1 tablet (150 mg total) by mouth 2 (two) times daily. 01/17/17   Kathlee Nations, MD  lisinopril (PRINIVIL,ZESTRIL) 10 MG tablet Take 0.5 tablets (5 mg total)  by mouth daily. 10/07/15   Florencia Reasons, MD  loratadine (CLARITIN) 10 MG tablet Take 10 mg by mouth daily as needed for allergies.    Historical Provider, MD  Maltodextrin-Xanthan Gum (RESOURCE THICKENUP CLEAR) POWD Take 120 g by mouth as needed. Patient taking differently: Take 1 Can by mouth daily as needed (takes 120mg  as needed food thickener).  10/07/15   Florencia Reasons, MD  Multiple Vitamin (MULTIVITAMIN WITH MINERALS) TABS tablet Take 1 tablet by mouth daily.    Historical Provider, MD  pantoprazole (PROTONIX) 40 MG tablet Take 40 mg by mouth daily. Reported on 07/06/2016    Historical Provider, MD  polyethylene glycol (MIRALAX / GLYCOLAX) packet Take 17 g by mouth daily.    Historical Provider, MD  predniSONE (DELTASONE) 2.5 MG tablet Take 2.5 mg by mouth daily with breakfast.    Historical Provider,  MD  QUEtiapine (SEROQUEL) 25 MG tablet Take 3 tablets (75 mg total) by mouth at bedtime. 01/17/17   Kathlee Nations, MD  rosuvastatin (CRESTOR) 20 MG tablet Take 20 mg by mouth daily.    Historical Provider, MD  sertraline (ZOLOFT) 100 MG tablet Take 2 tablets (200 mg total) by mouth daily. 01/17/17   Kathlee Nations, MD  Skin Protectants, Misc. (BAZA PROTECT EX) Apply 1 application topically 2 (two) times daily. Apply to sacral (to dry skin)    Historical Provider, MD  traZODone (DESYREL) 50 MG tablet Take 1 tablet (50 mg total) by mouth at bedtime. 01/17/17   Kathlee Nations, MD    Family History Family History  Problem Relation Age of Onset  . Depression Father   . Cancer Mother   . Heart disease Mother     Social History Social History  Substance Use Topics  . Smoking status: Never Smoker  . Smokeless tobacco: Never Used  . Alcohol use No     Comment: occasional     Allergies   Plant sterols and stanols   Review of Systems Review of Systems  Constitutional: Negative for fever.  Eyes: Negative for photophobia.  Respiratory: Negative for shortness of breath.   Cardiovascular: Negative for  chest pain.  All other systems reviewed and are negative.  Physical Exam Updated Vital Signs BP 142/76 (BP Location: Right Arm)   Pulse 60   Resp 18   SpO2 96%   Physical Exam  Constitutional: He appears well-developed and well-nourished. No distress.  HENT:  Head: Normocephalic. Head is without raccoon's eyes and without Battle's sign.  Right Ear: External ear normal. No hemotympanum.  Left Ear: External ear normal. No hemotympanum.  Mouth/Throat: Oropharynx is clear and moist. No oropharyngeal exudate.  Moist mucous membranes. Abrasion to left temple   Eyes: Conjunctivae are normal. Pupils are equal, round, and reactive to light.  Neck: Normal range of motion. Neck supple. No JVD present.  Trachea midline No bruit  Cardiovascular: Normal rate, regular rhythm and normal heart sounds.   Pulmonary/Chest: Effort normal and breath sounds normal. No stridor. No respiratory distress. He has no wheezes. He has no rales.  Abdominal: Soft. Bowel sounds are normal. He exhibits no distension. There is no rebound and no guarding.  Pelvis stable. Hips in place. No foreshortening or external rotation of the hips. Intact DTRs.   Musculoskeletal: Normal range of motion. He exhibits no edema, tenderness or deformity.  Neurological: He is alert. He has normal reflexes. He displays normal reflexes. He exhibits normal muscle tone. Coordination normal.  Skin: Skin is warm and dry. Capillary refill takes less than 2 seconds.  Psychiatric: He has a normal mood and affect. His behavior is normal.  Nursing note and vitals reviewed.  ED Treatments / Results   Vitals:   02/07/17 0006  BP: 142/76  Pulse: 60  Resp: 18    DIAGNOSTIC STUDIES:  Oxygen Saturation is 96% on RA, normal by my interpretation.    Results for orders placed or performed during the hospital encounter of 12/11/16  Urine culture  Result Value Ref Range   Specimen Description URINE, RANDOM    Special Requests NONE    Culture  >=100,000 COLONIES/mL PSEUDOMONAS AERUGINOSA (A)    Report Status 12/15/2016 FINAL    Organism ID, Bacteria PSEUDOMONAS AERUGINOSA (A)       Susceptibility   Pseudomonas aeruginosa - MIC*    CEFTAZIDIME 4 SENSITIVE Sensitive     CIPROFLOXACIN <=  0.25 SENSITIVE Sensitive     GENTAMICIN <=1 SENSITIVE Sensitive     IMIPENEM 1 SENSITIVE Sensitive     PIP/TAZO 8 SENSITIVE Sensitive     CEFEPIME 2 SENSITIVE Sensitive     * >=100,000 COLONIES/mL PSEUDOMONAS AERUGINOSA  Urinalysis, Routine w reflex microscopic  Result Value Ref Range   Color, Urine YELLOW YELLOW   APPearance HAZY (A) CLEAR   Specific Gravity, Urine 1.008 1.005 - 1.030   pH 6.0 5.0 - 8.0   Glucose, UA NEGATIVE NEGATIVE mg/dL   Hgb urine dipstick NEGATIVE NEGATIVE   Bilirubin Urine NEGATIVE NEGATIVE   Ketones, ur NEGATIVE NEGATIVE mg/dL   Protein, ur NEGATIVE NEGATIVE mg/dL   Nitrite NEGATIVE NEGATIVE   Leukocytes, UA LARGE (A) NEGATIVE   RBC / HPF 6-30 0 - 5 RBC/hpf   WBC, UA TOO NUMEROUS TO COUNT 0 - 5 WBC/hpf   Bacteria, UA RARE (A) NONE SEEN   Squamous Epithelial / LPF NONE SEEN NONE SEEN   WBC Clumps PRESENT    Mucous PRESENT    Ca Oxalate Crys, UA PRESENT    Ct Head Wo Contrast  Result Date: 02/07/2017 CLINICAL DATA:  Status post fall, with abrasion at the left side of the head. Concern for cervical spine injury. Patient on Plavix. Initial encounter. EXAM: CT HEAD WITHOUT CONTRAST CT CERVICAL SPINE WITHOUT CONTRAST TECHNIQUE: Multidetector CT imaging of the head and cervical spine was performed following the standard protocol without intravenous contrast. Multiplanar CT image reconstructions of the cervical spine were also generated. COMPARISON:  CT of the head and cervical spine performed 01/27/2017 FINDINGS: CT HEAD FINDINGS Brain: No evidence of acute infarction, hemorrhage, hydrocephalus, extra-axial collection or mass lesion/mass effect. Prominence of the ventricles and sulci reflects moderate cortical volume loss.  Cerebellar atrophy is noted. Scattered periventricular white matter change likely reflects small vessel ischemic microangiopathy. The brainstem and fourth ventricle are within normal limits. The basal ganglia are unremarkable in appearance. The cerebral hemispheres demonstrate grossly normal gray-white differentiation. No mass effect or midline shift is seen. Vascular: No hyperdense vessel or unexpected calcification. Skull: There is no evidence of fracture; visualized osseous structures are unremarkable in appearance. Sinuses/Orbits: The visualized portions of the orbits are within normal limits. The paranasal sinuses and mastoid air cells are well-aerated. Other: No significant soft tissue abnormalities are seen. CT CERVICAL SPINE FINDINGS Alignment: Normal. Skull base and vertebrae: No acute fracture. No primary bone lesion or focal pathologic process. Soft tissues and spinal canal: No prevertebral fluid or swelling. No visible canal hematoma. Disc levels: Multilevel disc space narrowing is noted along the lower cervical spine, with scattered anterior and posterior disc osteophyte complexes, and mild underlying facet disease. Upper chest: Calcification seen at the carotid bifurcations bilaterally. The thyroid gland is unremarkable in appearance, aside from minimal left-sided calcification. Other: No additional soft tissue abnormalities are seen. IMPRESSION: 1. No evidence of traumatic intracranial injury or fracture. 2. No evidence of fracture or subluxation along the cervical spine. 3. Moderate cortical volume loss and scattered small vessel ischemic microangiopathy. 4. Mild degenerative change along the cervical spine. 5. Calcification at the carotid bifurcations bilaterally. Carotid ultrasound could be considered for further evaluation, when and as deemed clinically appropriate. Electronically Signed   By: Garald Balding M.D.   On: 02/07/2017 00:33   Ct Head Wo Contrast  Result Date: 01/27/2017 CLINICAL  DATA:  Unwitnessed fall. EXAM: CT HEAD WITHOUT CONTRAST CT CERVICAL SPINE WITHOUT CONTRAST TECHNIQUE: Multidetector CT imaging of the head and cervical  spine was performed following the standard protocol without intravenous contrast. Multiplanar CT image reconstructions of the cervical spine were also generated. COMPARISON:  CT of the head and cervical spine dated 12/13/2015. FINDINGS: CT HEAD FINDINGS Brain: No evidence of acute infarction, hemorrhage, hydrocephalus, extra-axial collection or mass lesion/mass effect. Stable mild chronic microvascular ischemic changes and parenchymal volume loss of the brain. Stable prominent right paramedian cerebellar extra-axial space likely representing an arachnoid cyst. Vascular: Extensive calcific atherosclerosis of the vertebral artery is and cavernous segments of internal carotid arteries. Skull: Normal. Negative for fracture or focal lesion. Sinuses/Orbits: No acute finding. Other: None. CT CERVICAL SPINE FINDINGS Alignment: Straightening of cervical lordosis with slight reversal from the C4 through C6 levels. No listhesis. Skull base and vertebrae: No acute fracture. No primary bone lesion or focal pathologic process. Soft tissues and spinal canal: No prevertebral fluid or swelling. No visible canal hematoma. Disc levels: Moderate cervical spondylosis with discogenic degenerative changes greatest at the C5 through C7 levels and multilevel facet arthropathy greatest on the right at C2-3 on the left at C3-4. No high-grade bony canal stenosis. Upper chest: Negative. Other: Moderate calcific atherosclerosis of the carotid bifurcations bilaterally. Left thyroid lobe calcification. IMPRESSION: 1. No acute intracranial abnormality. 2. Stable mild chronic microvascular ischemic changes and parenchymal volume loss of the brain. 3. 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 4. No acute fracture or dislocation of the cervical spine.  Electronically Signed   By: Kristine Garbe M.D.   On: 01/27/2017 03:37   Ct Cervical Spine Wo Contrast  Result Date: 02/07/2017 CLINICAL DATA:  Status post fall, with abrasion at the left side of the head. Concern for cervical spine injury. Patient on Plavix. Initial encounter. EXAM: CT HEAD WITHOUT CONTRAST CT CERVICAL SPINE WITHOUT CONTRAST TECHNIQUE: Multidetector CT imaging of the head and cervical spine was performed following the standard protocol without intravenous contrast. Multiplanar CT image reconstructions of the cervical spine were also generated. COMPARISON:  CT of the head and cervical spine performed 01/27/2017 FINDINGS: CT HEAD FINDINGS Brain: No evidence of acute infarction, hemorrhage, hydrocephalus, extra-axial collection or mass lesion/mass effect. Prominence of the ventricles and sulci reflects moderate cortical volume loss. Cerebellar atrophy is noted. Scattered periventricular white matter change likely reflects small vessel ischemic microangiopathy. The brainstem and fourth ventricle are within normal limits. The basal ganglia are unremarkable in appearance. The cerebral hemispheres demonstrate grossly normal gray-white differentiation. No mass effect or midline shift is seen. Vascular: No hyperdense vessel or unexpected calcification. Skull: There is no evidence of fracture; visualized osseous structures are unremarkable in appearance. Sinuses/Orbits: The visualized portions of the orbits are within normal limits. The paranasal sinuses and mastoid air cells are well-aerated. Other: No significant soft tissue abnormalities are seen. CT CERVICAL SPINE FINDINGS Alignment: Normal. Skull base and vertebrae: No acute fracture. No primary bone lesion or focal pathologic process. Soft tissues and spinal canal: No prevertebral fluid or swelling. No visible canal hematoma. Disc levels: Multilevel disc space narrowing is noted along the lower cervical spine, with scattered anterior and  posterior disc osteophyte complexes, and mild underlying facet disease. Upper chest: Calcification seen at the carotid bifurcations bilaterally. The thyroid gland is unremarkable in appearance, aside from minimal left-sided calcification. Other: No additional soft tissue abnormalities are seen. IMPRESSION: 1. No evidence of traumatic intracranial injury or fracture. 2. No evidence of fracture or subluxation along the cervical spine. 3. Moderate cortical volume loss and scattered small vessel ischemic microangiopathy. 4. Mild degenerative  change along the cervical spine. 5. Calcification at the carotid bifurcations bilaterally. Carotid ultrasound could be considered for further evaluation, when and as deemed clinically appropriate. Electronically Signed   By: Garald Balding M.D.   On: 02/07/2017 00:33   Ct Cervical Spine Wo Contrast  Result Date: 01/27/2017 CLINICAL DATA:  Unwitnessed fall. EXAM: CT HEAD WITHOUT CONTRAST CT CERVICAL SPINE WITHOUT CONTRAST TECHNIQUE: Multidetector CT imaging of the head and cervical spine was performed following the standard protocol without intravenous contrast. Multiplanar CT image reconstructions of the cervical spine were also generated. COMPARISON:  CT of the head and cervical spine dated 12/13/2015. FINDINGS: CT HEAD FINDINGS Brain: No evidence of acute infarction, hemorrhage, hydrocephalus, extra-axial collection or mass lesion/mass effect. Stable mild chronic microvascular ischemic changes and parenchymal volume loss of the brain. Stable prominent right paramedian cerebellar extra-axial space likely representing an arachnoid cyst. Vascular: Extensive calcific atherosclerosis of the vertebral artery is and cavernous segments of internal carotid arteries. Skull: Normal. Negative for fracture or focal lesion. Sinuses/Orbits: No acute finding. Other: None. CT CERVICAL SPINE FINDINGS Alignment: Straightening of cervical lordosis with slight reversal from the C4 through C6  levels. No listhesis. Skull base and vertebrae: No acute fracture. No primary bone lesion or focal pathologic process. Soft tissues and spinal canal: No prevertebral fluid or swelling. No visible canal hematoma. Disc levels: Moderate cervical spondylosis with discogenic degenerative changes greatest at the C5 through C7 levels and multilevel facet arthropathy greatest on the right at C2-3 on the left at C3-4. No high-grade bony canal stenosis. Upper chest: Negative. Other: Moderate calcific atherosclerosis of the carotid bifurcations bilaterally. Left thyroid lobe calcification. IMPRESSION: 1. No acute intracranial abnormality. 2. Stable mild chronic microvascular ischemic changes and parenchymal volume loss of the brain. 3. 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 4. No acute fracture or dislocation of the cervical spine. Electronically Signed   By: Kristine Garbe M.D.   On: 01/27/2017 03:37   Dg Hips Bilat With Pelvis Min 5 Views  Result Date: 01/27/2017 CLINICAL DATA:  Unwitnessed fall EXAM: DG HIP (WITH OR WITHOUT PELVIS) 5+V BILAT COMPARISON:  None. FINDINGS: The SI joints are symmetric. Pubic symphysis appears intact. The patient is status post bilateral hip replacements with grossly intact hardware. No acute fracture or dislocation. IMPRESSION: Status post bilateral hip replacements. No acute osseous abnormality. Electronically Signed   By: Donavan Foil M.D.   On: 01/27/2017 03:39    COORDINATION OF CARE:  1:50 AM Discussed treatment plan with pt at bedside and pt agreed to plan.   Procedures Procedures (including critical care time)    Final Clinical Impressions(s) / ED Diagnoses   Final diagnoses:  Fall  Continue meds previously prescribed. Return for fevers, weakness, intractable pain or vomiting or any concerns.  The patient appears reasonably screened and/or stabilized for discharge and I doubt any other medical condition  or other [facility] requiring further screening, evaluation, or treatment in the ED at this time prior to discharge.  I personally performed the services described in this documentation, which was scribed in my presence. The recorded information has been reviewed and is accurate.       Veatrice Kells, MD 02/07/17 [phone]

## 2017-02-07 NOTE — ED Notes (Signed)
Patient transported to CT 

## 2017-03-22 ENCOUNTER — Ambulatory Visit (INDEPENDENT_AMBULATORY_CARE_PROVIDER_SITE_OTHER): Payer: Medicare Other | Admitting: Neurology

## 2017-03-22 ENCOUNTER — Encounter: Payer: Self-pay | Admitting: Neurology

## 2017-03-22 VITALS — BP 110/59 | HR 61 | Ht 70.0 in

## 2017-03-22 DIAGNOSIS — R269 Unspecified abnormalities of gait and mobility: Secondary | ICD-10-CM

## 2017-03-22 DIAGNOSIS — R413 Other amnesia: Secondary | ICD-10-CM

## 2017-03-22 NOTE — Progress Notes (Signed)
Reason for visit: Gait disorder  [name]. is an 82 y.o. male  History of present illness:  [name] is an 82 year old right-handed white male with a history of a memory disorder and a gait disorder. The patient has had a dramatic change in his ability to ambulate. He has had 3 emergency room visits because of falls since I have seen him last, on 12/11/2016, 01/27/2017, and on 02/06/2017. The patient one year ago was walking well with a walker, by July 2017, he was still ambulatory with a walker, but he was starting to have a tendency to lean backwards. When I saw him in December 2017 he was nonambulatory. His memory has also declined with a score of 26/30 in December 2016, now scoring 17/30 today. The patient has undergone MRI evaluation of the brain that did not show any new cerebrovascular events. The patient does have some mild cortical atrophy with ventriculomegaly, upon review of the CT scans and MRI scans done over the years, there may be some slight enlargement of the ventricular size over time. The patient apparently tries to get up out of the bed to go to the bathroom without help and he will fall. The patient returns for an evaluation.  Past Medical History:  Diagnosis Date  . Anxiety   . Arthritis   . Benign prostate hyperplasia   . Bipolar disorder ([place])   . C2 cervical fracture ([name])    due to pt fall  . Chronic indwelling Foley catheter   . Depression   . Diabetes mellitus type II   . Falls   . Flu   . Hearing aid worn   . Hypertension   . Memory difficulty 08/21/2014  . Memory loss   . MRSA infection (methicillin-resistant Staphylococcus aureus)   . Neuromuscular disorder ([place])    parkinsons  . Parkinson disease ([place])   . Pneumonia   . SIRS (systemic inflammatory response syndrome) ([name])   . UTI (urinary tract infection)     Past Surgical History:  Procedure Laterality Date  . TOTAL HIP ARTHROPLASTY    . VIDEO ASSISTED THORACOSCOPY (VATS)/EMPYEMA   12/16/2012   Procedure: VIDEO ASSISTED THORACOSCOPY (VATS)/EMPYEMA;  Surgeon: Melrose Nakayama, MD;  Location: [facility];  Service: Thoracic;  Laterality: Right;  Marland Kitchen VIDEO BRONCHOSCOPY  12/16/2012   Procedure: VIDEO BRONCHOSCOPY;  Surgeon: Melrose Nakayama, MD;  Location: [facility];  Service: Thoracic;  Laterality: Right;    Family History  Problem Relation Age of Onset  . Depression Father   . Cancer Mother   . Heart disease Mother     Social history:  reports that he has never smoked. He has never used smokeless tobacco. He reports that he does not drink alcohol or use drugs.    Allergies  Allergen Reactions  . Plant Sterols And Stanols Other (See Comments)    ON MAR    Medications:  Prior to Admission medications   Medication Sig Start Date End Date Taking? Authorizing Provider  acetaminophen (TYLENOL) 325 MG tablet Take 650 mg by mouth every 6 (six) hours as needed for mild pain.   Yes Historical Provider, MD  aspirin 81 MG chewable tablet Chew 81 mg by mouth daily.   Yes Historical Provider, MD  bisacodyl (DULCOLAX) 10 MG suppository Place 10 mg rectally as needed for moderate constipation.   Yes Historical Provider, MD  carbidopa-levodopa (SINEMET IR) 25-250 MG per tablet Take 1 tablet by mouth 3 (three) times daily.  Yes Historical Provider, MD  ciprofloxacin (CIPRO) 500 MG tablet Take 1 tablet (500 mg total) by mouth 2 (two) times daily. 12/12/16  Yes Ezequiel Essex, MD  clonazePAM (KLONOPIN) 0.5 MG tablet Take 0.5 tablets (0.25 mg total) by mouth daily as needed for anxiety. 01/17/17  Yes Kathlee Nations, MD  clopidogrel (PLAVIX) 75 MG tablet Take 1 tablet (75 mg total) by mouth daily. Patient taking differently: Take 75 mg by mouth every evening.  10/07/15  Yes Florencia Reasons, MD  entacapone (COMTAN) 200 MG tablet Take 200 mg by mouth 3 (three) times daily.   Yes Historical Provider, MD  feeding supplement, GLUCERNA SHAKE, (GLUCERNA SHAKE) LIQD Take 237 mLs by mouth 2 (two) times  daily between meals. 03/16/16  Yes Kelvin Cellar, MD  ferrous sulfate 325 (65 FE) MG tablet Take 325 mg by mouth 2 (two) times daily.   Yes Historical Provider, MD  guaifenesin (ROBITUSSIN) 100 MG/5ML syrup Take 200 mg by mouth 3 (three) times daily as needed for cough.   Yes Historical Provider, MD  isosorbide mononitrate (IMDUR) 30 MG 24 hr tablet Take 1 tablet (30 mg total) by mouth daily. 10/07/15  Yes Florencia Reasons, MD  lamoTRIgine (LAMICTAL) 150 MG tablet Take 1 tablet (150 mg total) by mouth 2 (two) times daily. 01/17/17  Yes Kathlee Nations, MD  lisinopril (PRINIVIL,ZESTRIL) 10 MG tablet Take 0.5 tablets (5 mg total) by mouth daily. 10/07/15  Yes Florencia Reasons, MD  loratadine (CLARITIN) 10 MG tablet Take 10 mg by mouth daily as needed for allergies.   Yes Historical Provider, MD  Maltodextrin-Xanthan Gum (RESOURCE THICKENUP CLEAR) POWD Take 120 g by mouth as needed. Patient taking differently: Take 1 Can by mouth daily as needed (takes 120mg  as needed food thickener).  10/07/15  Yes Florencia Reasons, MD  Multiple Vitamin (MULTIVITAMIN WITH MINERALS) TABS tablet Take 1 tablet by mouth daily.   Yes Historical Provider, MD  pantoprazole (PROTONIX) 40 MG tablet Take 40 mg by mouth daily. Reported on 07/06/2016   Yes Historical Provider, MD  polyethylene glycol (MIRALAX / GLYCOLAX) packet Take 17 g by mouth daily.   Yes Historical Provider, MD  predniSONE (DELTASONE) 2.5 MG tablet Take 2.5 mg by mouth daily with breakfast.   Yes Historical Provider, MD  QUEtiapine (SEROQUEL) 25 MG tablet Take 3 tablets (75 mg total) by mouth at bedtime. 01/17/17  Yes Kathlee Nations, MD  rosuvastatin (CRESTOR) 20 MG tablet Take 20 mg by mouth daily.   Yes Historical Provider, MD  sertraline (ZOLOFT) 100 MG tablet Take 2 tablets (200 mg total) by mouth daily. 01/17/17  Yes Kathlee Nations, MD  Skin Protectants, Misc. (BAZA PROTECT EX) Apply 1 application topically 2 (two) times daily. Apply to sacral (to dry skin)   Yes Historical Provider, MD    traZODone (DESYREL) 50 MG tablet Take 1 tablet (50 mg total) by mouth at bedtime. 01/17/17  Yes Kathlee Nations, MD  amLODipine (NORVASC) 5 MG tablet Take 5 mg by mouth daily.    Historical Provider, MD  hydrALAZINE (APRESOLINE) 25 MG tablet Take 25 mg by mouth 2 (two) times daily.     Historical Provider, MD    ROS:  Out of a complete 14 system review of symptoms, the patient complains only of the following symptoms, and all other reviewed systems are negative.  Gait disorder Memory disorder  Blood pressure (!) 110/59, pulse 61, height 5\' 10"  (1.778 m), SpO2 95 %.  Physical Exam  General:  The patient is alert and cooperative at the time of the examination.  Skin: No significant peripheral edema is noted.   Neurologic Exam  Mental status: The patient is alert and oriented x 2 at the time of the examination (not oriented to date). The Mini-Mental Status Examination done today shows a total score of 17/30.   Cranial nerves: Facial symmetry is present. Speech is normal, no aphasia or dysarthria is noted. Extraocular movements are full. Visual fields are full.  Motor: The patient has good strength in all 4 extremities.  Sensory examination: Soft touch sensation is symmetric on the face, arms, and legs.  Coordination: The patient has good finger-nose-finger and heel-to-shin bilaterally, but there is some apraxia with the use of the lower extremities.  Gait and station: The patient was assisted to the standing position, the patient stands with the knees partially flexed, he can only take 1 or 2 steps, with a definite tendency to fall backwards, he requires maximum assistance with walking.  Reflexes: Deep tendon reflexes are symmetric.   Assessment/Plan:  1. Gait disorder  2. Memory disorder  The patient has had a dramatic change in his ability to ambulate over the last 8 or 9 months. He does have some ventriculomegaly, the possibility of normal pressure hydrocephalus needs to be  entertained. His cognitive capacity has also worsened over the last 8 or 9 months. The patient will be set up for a high-volume lumbar puncture, he will return to the office within 24 hours for reevaluation of his gait following the spinal tap. Otherwise, he will follow-up in 6 months.  Jill Alexanders MD 03/22/2017 12:16 PM  [facility] [address] [place], [place] [address]  Phone [phone] Fax [phone]

## 2017-04-03 ENCOUNTER — Telehealth: Payer: Self-pay | Admitting: *Deleted

## 2017-04-03 NOTE — Telephone Encounter (Signed)
Called and LVM for [facility]. Advised [facility] has been trying to call and schedule pt LP. Hanley Seamen [phone] for them to call and schedule.

## 2017-04-09 ENCOUNTER — Ambulatory Visit: Payer: Medicare Other | Admitting: Podiatry

## 2017-04-27 ENCOUNTER — Emergency Department (HOSPITAL_COMMUNITY)
Admission: EM | Admit: 2017-04-27 | Discharge: 2017-04-27 | Disposition: A | Discharge status: Skilled Nursing Facility | Payer: Medicare Other | Attending: Emergency Medicine | Admitting: Emergency Medicine

## 2017-04-27 ENCOUNTER — Encounter (HOSPITAL_COMMUNITY): Payer: Self-pay

## 2017-04-27 DIAGNOSIS — E119 Type 2 diabetes mellitus without complications: Secondary | ICD-10-CM | POA: Insufficient documentation

## 2017-04-27 DIAGNOSIS — Z79899 Other long term (current) drug therapy: Secondary | ICD-10-CM | POA: Insufficient documentation

## 2017-04-27 DIAGNOSIS — Z7902 Long term (current) use of antithrombotics/antiplatelets: Secondary | ICD-10-CM | POA: Insufficient documentation

## 2017-04-27 DIAGNOSIS — Z7982 Long term (current) use of aspirin: Secondary | ICD-10-CM | POA: Insufficient documentation

## 2017-04-27 DIAGNOSIS — I10 Essential (primary) hypertension: Secondary | ICD-10-CM | POA: Insufficient documentation

## 2017-04-27 DIAGNOSIS — G20 Parkinson's disease: Secondary | ICD-10-CM | POA: Insufficient documentation

## 2017-04-27 DIAGNOSIS — N3289 Other specified disorders of bladder: Secondary | ICD-10-CM

## 2017-04-27 LAB — CBG MONITORING, ED: GLUCOSE-CAPILLARY: 104 mg/dL — AB (ref 65–99)

## 2017-04-27 MED ORDER — BELLADONNA-OPIUM 16.2-30 MG RE SUPP: 30.0000 mg | Freq: Three times a day (TID) | RECTAL | 0 refills | Status: DC | PRN

## 2017-04-27 NOTE — Discharge Instructions (Signed)
Your pain is probably from bladder spasms. If it comes back, use the belladonna and opium suppository to relieve the bladder spasms.

## 2017-04-27 NOTE — ED Notes (Signed)
Bed: WI09 Expected date:  Expected time:  Means of arrival:  Comments: Catheter issues

## 2017-04-27 NOTE — ED Notes (Signed)
Patient discharged to [facility] via [facility]. [facility]ed [facility] to [facility], but no staff answered to phone. Report given to [facility]. Patient out of department at this time.

## 2017-04-27 NOTE — ED Triage Notes (Signed)
[facility] with complaints of urinary catheter pain. Upon assessment patient denies pain but grimaces with movement. Redness with MASD noted to bilateral groin areas also redness and pain with Stage II pressure wound noted to buttocks. Patient has a history of dementia and is a poor historian of current visit.

## 2017-04-27 NOTE — ED Provider Notes (Signed)
[facility] DEPT Provider Note   CSN: [id number] Arrival date & time: 04/27/17  0053  By signing my name below, I, Margit Banda, attest that this documentation has been prepared under the direction and in the presence of Delora Fuel, MD. Electronically Signed: Margit Banda, ED Scribe. 04/27/17. 2:30 AM.  LEVEL V CAVEAT: HPI and ROS limited due to dementia.  History   Chief Complaint Chief Complaint  Patient presents with  . Groin Pain    HPI [name]. is a 82 y.o. male BIB EMS from [facility] with a PMHx of dementia who presents to the Emergency Department complaining of urinary catheter pain that started PTA. Pain is exacerbated with movement.    The history is provided by the patient. The history is limited by the condition of the patient. No language interpreter was used.    Past Medical History:  Diagnosis Date  . Anxiety   . Arthritis   . Benign prostate hyperplasia   . Bipolar disorder ([place])   . C2 cervical fracture ([name])    due to pt fall  . Chronic indwelling Foley catheter   . Depression   . Diabetes mellitus type II   . Falls   . Flu   . Hearing aid worn   . Hypertension   . Memory difficulty 08/21/2014  . Memory loss   . MRSA infection (methicillin-resistant Staphylococcus aureus)   . Neuromuscular disorder ([place])    parkinsons  . Parkinson disease ([place])   . Pneumonia   . SIRS (systemic inflammatory response syndrome) ([name])   . UTI (urinary tract infection)     Patient Active Problem List   Diagnosis Date Noted  . CAP (community acquired pneumonia) 11/29/2016  . Bacteremia 03/14/2016  . HCAP (healthcare-associated pneumonia)   . Pressure ulcer 10/01/2015  . Right lower lobe pneumonia ([place]) 10/01/2015  . Sepsis due to Gram-negative organism with acute respiratory failure ([place]) 10/01/2015  . Acute renal failure ([place]) 10/01/2015  . Acute respiratory failure with hypoxia ([place]) 09/30/2015  . Influenza A (H1N1) 03/25/2015  .  Dementia without behavioral disturbance 03/25/2015  . Hyperkalemia 03/25/2015  . Fall 03/16/2015  . Sacral pressure ulcer 03/16/2015  . Fever 03/16/2015  . Memory difficulty 08/21/2014  . Malnutrition of moderate degree ([place]) 04/19/2014  . UTI (lower urinary tract infection) 04/18/2014  . Hypoxia 04/18/2014  . PNA (pneumonia) 04/18/2014  . Chronic diastolic congestive heart failure ([place]) 02/01/2013  . Pericardial effusion 02/01/2013  . Bradycardia 01/31/2013  . Empyema lung ([place]) 12/23/2012  . S/P thoracotomy 12/23/2012  . Abnormality of gait 12/05/2012  . Hypernatremia 08/13/2012  . Hypertension   . Diabetes mellitus, type 2 ([place])   . Arthritis   . Bipolar disorder ([place])   . Anxiety   . Chronic indwelling Foley catheter   . Neuromuscular disorder ([place])   . Bacteremia of undetermined etiology 06/17/2012  . Parkinson disease ([place]) 04/16/2012  . Elevated troponin 04/16/2012  . Hearing aid worn   . Diabetes mellitus ([place]) 07/06/2011    Past Surgical History:  Procedure Laterality Date  . TOTAL HIP ARTHROPLASTY    . VIDEO ASSISTED THORACOSCOPY (VATS)/EMPYEMA  12/16/2012   Procedure: VIDEO ASSISTED THORACOSCOPY (VATS)/EMPYEMA;  Surgeon: Melrose Nakayama, MD;  Location: [facility];  Service: Thoracic;  Laterality: Right;  Marland Kitchen VIDEO BRONCHOSCOPY  12/16/2012   Procedure: VIDEO BRONCHOSCOPY;  Surgeon: Melrose Nakayama, MD;  Location: [facility];  Service: Thoracic;  Laterality: Right;       Home Medications  Prior to Admission medications   Medication Sig Start Date End Date Taking? Authorizing Provider  acetaminophen (TYLENOL) 325 MG tablet Take 650 mg by mouth every 6 (six) hours as needed for mild pain.    [provider]  amLODipine (NORVASC) 5 MG tablet Take 5 mg by mouth daily.    [provider]  aspirin 81 MG chewable tablet Chew 81 mg by mouth daily.    [provider]  bisacodyl (DULCOLAX) 10 MG suppository Place 10 mg rectally as needed for  moderate constipation.    [provider]  carbidopa-levodopa (SINEMET IR) 25-250 MG per tablet Take 1 tablet by mouth 3 (three) times daily.    [provider]  ciprofloxacin (CIPRO) 500 MG tablet Take 1 tablet (500 mg total) by mouth 2 (two) times daily. 12/12/16   Rancour, Annie Main, MD  clonazePAM (KLONOPIN) 0.5 MG tablet Take 0.5 tablets (0.25 mg total) by mouth daily as needed for anxiety. 01/17/17   Arfeen, Arlyce Harman, MD  clopidogrel (PLAVIX) 75 MG tablet Take 1 tablet (75 mg total) by mouth daily. Patient taking differently: Take 75 mg by mouth every evening.  10/07/15   Florencia Reasons, MD  entacapone (COMTAN) 200 MG tablet Take 200 mg by mouth 3 (three) times daily.    [provider]  feeding supplement, GLUCERNA SHAKE, (GLUCERNA SHAKE) LIQD Take 237 mLs by mouth 2 (two) times daily between meals. 03/16/16   Kelvin Cellar, MD  ferrous sulfate 325 (65 FE) MG tablet Take 325 mg by mouth 2 (two) times daily.    [provider]  guaifenesin (ROBITUSSIN) 100 MG/5ML syrup Take 200 mg by mouth 3 (three) times daily as needed for cough.    [provider]  hydrALAZINE (APRESOLINE) 25 MG tablet Take 25 mg by mouth 2 (two) times daily.     [provider]  isosorbide mononitrate (IMDUR) 30 MG 24 hr tablet Take 1 tablet (30 mg total) by mouth daily. 10/07/15   Florencia Reasons, MD  lamoTRIgine (LAMICTAL) 150 MG tablet Take 1 tablet (150 mg total) by mouth 2 (two) times daily. 01/17/17   Arfeen, Arlyce Harman, MD  lisinopril (PRINIVIL,ZESTRIL) 10 MG tablet Take 0.5 tablets (5 mg total) by mouth daily. 10/07/15   Florencia Reasons, MD  loratadine (CLARITIN) 10 MG tablet Take 10 mg by mouth daily as needed for allergies.    [provider]  Maltodextrin-Xanthan Gum (RESOURCE THICKENUP CLEAR) POWD Take 120 g by mouth as needed. Patient taking differently: Take 1 Can by mouth daily as needed (takes 120mg  as needed food thickener).  10/07/15   Florencia Reasons, MD  Multiple Vitamin  (MULTIVITAMIN WITH MINERALS) TABS tablet Take 1 tablet by mouth daily.    [provider]  pantoprazole (PROTONIX) 40 MG tablet Take 40 mg by mouth daily. Reported on 07/06/2016    [provider]  polyethylene glycol (MIRALAX / GLYCOLAX) packet Take 17 g by mouth daily.    [provider]  predniSONE (DELTASONE) 2.5 MG tablet Take 2.5 mg by mouth daily with breakfast.    [provider]  QUEtiapine (SEROQUEL) 25 MG tablet Take 3 tablets (75 mg total) by mouth at bedtime. 01/17/17   Arfeen, Arlyce Harman, MD  rosuvastatin (CRESTOR) 20 MG tablet Take 20 mg by mouth daily.    [provider]  sertraline (ZOLOFT) 100 MG tablet Take 2 tablets (200 mg total) by mouth daily. 01/17/17   Arfeen, Arlyce Harman, MD  Skin Protectants, Misc. (BAZA PROTECT  EX) Apply 1 application topically 2 (two) times daily. Apply to sacral (to dry skin)    [provider]  traZODone (DESYREL) 50 MG tablet Take 1 tablet (50 mg total) by mouth at bedtime. 01/17/17   Arfeen, Arlyce Harman, MD    Family History Family History  Problem Relation Age of Onset  . Depression Father   . Cancer Mother   . Heart disease Mother     Social History Social History  Substance Use Topics  . Smoking status: Never Smoker  . Smokeless tobacco: Never Used  . Alcohol use No     Comment: occasional     Allergies   Plant sterols and stanols   Review of Systems Review of Systems  Unable to perform ROS: Dementia     Physical Exam Updated Vital Signs BP (!) 148/69 (BP Location: Left Arm)   Pulse (!) 54   Temp 97.9 F (36.6 C) (Oral)   Resp 18   Ht 5\' 10"  (1.778 m)   Wt 155 lb (70.3 kg)   SpO2 96%   BMI 22.24 kg/m   Physical Exam  Constitutional: He appears well-developed and well-nourished.  HENT:  Head: Normocephalic and atraumatic.  Eyes: EOM are normal. Pupils are equal, round, and reactive to light.  Neck: Normal range of motion. Neck supple. No JVD present.  Cardiovascular:  Normal rate, regular rhythm and normal heart sounds.   No murmur heard. Pulmonary/Chest: Effort normal and breath sounds normal. He has no wheezes. He has no rales. He exhibits no tenderness.  Abdominal: Soft. Bowel sounds are normal. He exhibits no distension and no mass. There is no tenderness.  Genitourinary:  Genitourinary Comments: Circumcised penis with foley catheter in place. No redness or drainage at the amiantus  Mild erythema of the scrotum., No erythema or swelling or tenderness of perineum.  Erythema with slight maceration in inguina folds bilaterally.  Stage one sacral decubitus.   Musculoskeletal: Normal range of motion. He exhibits no edema.  Lymphadenopathy:    He has no cervical adenopathy.  Neurological: He is alert. No cranial nerve deficit. He exhibits normal muscle tone. Coordination normal.  Oriented to person, place, but not time.  Skin: Skin is warm and dry. No rash noted.  Psychiatric: He has a normal mood and affect. His behavior is normal. Judgment and thought content normal.  Nursing note and vitals reviewed.    ED Treatments / Results  DIAGNOSTIC STUDIES: Oxygen Saturation is 96% on RA, adequate by my interpretation.   Labs (all labs ordered are listed, but only abnormal results are displayed) Labs Reviewed  CBG MONITORING, ED - Abnormal; Notable for the following:       Result Value   Glucose-Capillary 104 (*)    All other components within normal limits     Procedures Procedures (including critical care time)  Medications Ordered in ED Medications - No data to display   Initial Impression / Assessment and Plan / ED Course  I have reviewed the triage vital signs and the nursing notes.  Pertinent lab results that were available during my care of the patient were reviewed by me and considered in my medical decision making (see chart for details).  Pain related to Foley catheter. Pain is actually gone currently. I suspect bladder spasms  secondary to Foley catheter. No evidence of any infection. He does show evidence of monilial infection in the inguinal creases, but this is already being treated appropriately. Very early gluteal decubitus will need to be  treated with appropriate positioning. He is discharged with prescription for belladonna and opium suppository to use as needed. Old records are reviewed, and he has no relevant past visits.  Final Clinical Impressions(s) / ED Diagnoses   Final diagnoses:  Bladder spasms    New Prescriptions New Prescriptions   BELLADONNA-OPIUM (B&O SUPPRETTES) 16.2-30 MG SUPPOSITORY    Place 1 suppository rectally every 8 (eight) hours as needed for pain.   I personally performed the services described in this documentation, which was scribed in my presence. The recorded information has been reviewed and is accurate.      Delora Fuel, MD 76/22/63 [phone]

## 2017-05-09 ENCOUNTER — Encounter (HOSPITAL_COMMUNITY): Payer: Self-pay | Admitting: *Deleted

## 2017-05-09 ENCOUNTER — Emergency Department (HOSPITAL_COMMUNITY)
Admission: EM | Admit: 2017-05-09 | Discharge: 2017-05-09 | Disposition: A | Discharge status: Home / Self Care | Payer: Medicare Other | Attending: Emergency Medicine | Admitting: Emergency Medicine

## 2017-05-09 DIAGNOSIS — Z96649 Presence of unspecified artificial hip joint: Secondary | ICD-10-CM | POA: Insufficient documentation

## 2017-05-09 DIAGNOSIS — G20 Parkinson's disease: Secondary | ICD-10-CM | POA: Insufficient documentation

## 2017-05-09 DIAGNOSIS — Y733 Surgical instruments, materials and gastroenterology and urology devices (including sutures) associated with adverse incidents: Secondary | ICD-10-CM | POA: Insufficient documentation

## 2017-05-09 DIAGNOSIS — I10 Essential (primary) hypertension: Secondary | ICD-10-CM | POA: Insufficient documentation

## 2017-05-09 DIAGNOSIS — T839XXA Unspecified complication of genitourinary prosthetic device, implant and graft, initial encounter: Secondary | ICD-10-CM

## 2017-05-09 DIAGNOSIS — T83098A Other mechanical complication of other indwelling urethral catheter, initial encounter: Secondary | ICD-10-CM | POA: Insufficient documentation

## 2017-05-09 DIAGNOSIS — E119 Type 2 diabetes mellitus without complications: Secondary | ICD-10-CM | POA: Insufficient documentation

## 2017-05-09 DIAGNOSIS — Z79899 Other long term (current) drug therapy: Secondary | ICD-10-CM | POA: Insufficient documentation

## 2017-05-09 DIAGNOSIS — Z7982 Long term (current) use of aspirin: Secondary | ICD-10-CM | POA: Insufficient documentation

## 2017-05-09 NOTE — ED Triage Notes (Signed)
Pt anxious, made aware again, we are waiting on transport, report has been called to facility and given to [facility].

## 2017-05-09 NOTE — ED Provider Notes (Signed)
[facility] DEPT Provider Note   CSN: [id number] Arrival date & time: 05/09/17  1528     History   Chief Complaint Chief Complaint  Patient presents with  . foley catheter problems    HPI [name]. is a 82 y.o. male.  HPI  82 y.o. male with a hx of DM2, BPH, HTN, presents to the Emergency Department today from [facility] due to intermittent pain from foley catheter. Upon patients arrival to ED, he denies pain or problems with catheter currently. Noted leg bag that was completely full and emptied with 400cc of urine that was yellow. No drainage around foley catheter. Pt denies pain currently. No N/V. No abdominal pain. Notes no leakage from catheter. No dysuria. No fevers. Pt states his foley is functioning properly and requests to go home. No other symptoms noted.    Past Medical History:  Diagnosis Date  . Anxiety   . Arthritis   . Benign prostate hyperplasia   . Bipolar disorder ([place])   . C2 cervical fracture ([name])    due to pt fall  . Chronic indwelling Foley catheter   . Depression   . Diabetes mellitus type II   . Falls   . Flu   . Hearing aid worn   . Hypertension   . Memory difficulty 08/21/2014  . Memory loss   . MRSA infection (methicillin-resistant Staphylococcus aureus)   . Neuromuscular disorder ([place])    parkinsons  . Parkinson disease ([place])   . Pneumonia   . SIRS (systemic inflammatory response syndrome) ([name])   . UTI (urinary tract infection)     Patient Active Problem List   Diagnosis Date Noted  . CAP (community acquired pneumonia) 11/29/2016  . Bacteremia 03/14/2016  . HCAP (healthcare-associated pneumonia)   . Pressure ulcer 10/01/2015  . Right lower lobe pneumonia ([place]) 10/01/2015  . Sepsis due to Gram-negative organism with acute respiratory failure ([place]) 10/01/2015  . Acute renal failure ([place]) 10/01/2015  . Acute respiratory failure with hypoxia ([place]) 09/30/2015  . Influenza A (H1N1) 03/25/2015  . Dementia without  behavioral disturbance 03/25/2015  . Hyperkalemia 03/25/2015  . Fall 03/16/2015  . Sacral pressure ulcer 03/16/2015  . Fever 03/16/2015  . Memory difficulty 08/21/2014  . Malnutrition of moderate degree ([place]) 04/19/2014  . UTI (lower urinary tract infection) 04/18/2014  . Hypoxia 04/18/2014  . PNA (pneumonia) 04/18/2014  . Chronic diastolic congestive heart failure ([place]) 02/01/2013  . Pericardial effusion 02/01/2013  . Bradycardia 01/31/2013  . Empyema lung ([place]) 12/23/2012  . S/P thoracotomy 12/23/2012  . Abnormality of gait 12/05/2012  . Hypernatremia 08/13/2012  . Hypertension   . Diabetes mellitus, type 2 ([place])   . Arthritis   . Bipolar disorder ([place])   . Anxiety   . Chronic indwelling Foley catheter   . Neuromuscular disorder ([place])   . Bacteremia of undetermined etiology 06/17/2012  . Parkinson disease ([place]) 04/16/2012  . Elevated troponin 04/16/2012  . Hearing aid worn   . Diabetes mellitus ([place]) 07/06/2011    Past Surgical History:  Procedure Laterality Date  . TOTAL HIP ARTHROPLASTY    . VIDEO ASSISTED THORACOSCOPY (VATS)/EMPYEMA  12/16/2012   Procedure: VIDEO ASSISTED THORACOSCOPY (VATS)/EMPYEMA;  Surgeon: Melrose Nakayama, MD;  Location: [facility];  Service: Thoracic;  Laterality: Right;  Marland Kitchen VIDEO BRONCHOSCOPY  12/16/2012   Procedure: VIDEO BRONCHOSCOPY;  Surgeon: Melrose Nakayama, MD;  Location: [facility];  Service: Thoracic;  Laterality: Right;       Home Medications  Prior to Admission medications   Medication Sig Start Date End Date Taking? Authorizing Provider  acetaminophen (TYLENOL) 325 MG tablet Take 650 mg by mouth every 6 (six) hours as needed for mild pain.   Yes [provider]  aspirin 81 MG chewable tablet Chew 81 mg by mouth daily.   Yes [provider]  belladonna-opium (B&O SUPPRETTES) 16.2-30 MG suppository Place 1 suppository rectally every 8 (eight) hours as needed for pain. 9/76/73  Yes Delora Fuel, MD  bisacodyl  (DULCOLAX) 10 MG suppository Place 10 mg rectally as needed for moderate constipation.   Yes [provider]  carbidopa-levodopa (SINEMET IR) 25-250 MG per tablet Take 1 tablet by mouth 3 (three) times daily.   Yes [provider]  clonazePAM (KLONOPIN) 0.5 MG tablet Take 0.5 tablets (0.25 mg total) by mouth daily as needed for anxiety. 01/17/17  Yes Arfeen, Arlyce Harman, MD  clopidogrel (PLAVIX) 75 MG tablet Take 1 tablet (75 mg total) by mouth daily. Patient taking differently: Take 75 mg by mouth every evening.  10/07/15  Yes Florencia Reasons, MD  entacapone (COMTAN) 200 MG tablet Take 200 mg by mouth 3 (three) times daily.   Yes [provider]  feeding supplement, GLUCERNA SHAKE, (GLUCERNA SHAKE) LIQD Take 237 mLs by mouth 2 (two) times daily between meals. 03/16/16  Yes Kelvin Cellar, MD  ferrous sulfate 325 (65 FE) MG tablet Take 325 mg by mouth 2 (two) times daily.   Yes [provider]  guaifenesin (ROBITUSSIN) 100 MG/5ML syrup Take 200 mg by mouth 3 (three) times daily as needed for cough.   Yes [provider]  hydrALAZINE (APRESOLINE) 25 MG tablet Take 25 mg by mouth 2 (two) times daily.    Yes [provider]  isosorbide mononitrate (IMDUR) 30 MG 24 hr tablet Take 1 tablet (30 mg total) by mouth daily. 10/07/15  Yes Florencia Reasons, MD  lamoTRIgine (LAMICTAL) 150 MG tablet Take 1 tablet (150 mg total) by mouth 2 (two) times daily. 01/17/17  Yes Arfeen, Arlyce Harman, MD  lisinopril (PRINIVIL,ZESTRIL) 10 MG tablet Take 0.5 tablets (5 mg total) by mouth daily. 10/07/15  Yes Florencia Reasons, MD  loratadine (CLARITIN) 10 MG tablet Take 10 mg by mouth daily as needed for allergies.   Yes [provider]  Maltodextrin-Xanthan Gum (RESOURCE THICKENUP CLEAR) POWD Take 120 g by mouth as needed. Patient taking differently: Take 1 Can by mouth daily as needed (takes 120mg  as needed food thickener).  10/07/15  Yes Florencia Reasons, MD  Multiple Vitamin (MULTIVITAMIN WITH  MINERALS) TABS tablet Take 1 tablet by mouth daily.   Yes [provider]  pantoprazole (PROTONIX) 40 MG tablet Take 40 mg by mouth daily. Reported on 07/06/2016   Yes [provider]  polyethylene glycol (MIRALAX / GLYCOLAX) packet Take 17 g by mouth daily.   Yes [provider]  predniSONE (DELTASONE) 2.5 MG tablet Take 2.5 mg by mouth daily with breakfast.   Yes [provider]  QUEtiapine (SEROQUEL) 25 MG tablet Take 3 tablets (75 mg total) by mouth at bedtime. 01/17/17  Yes Arfeen, Arlyce Harman, MD  rosuvastatin (CRESTOR) 20 MG tablet Take 20 mg by mouth daily.   Yes [provider]  sertraline (ZOLOFT) 100 MG tablet Take 2 tablets (200 mg total) by mouth daily. 01/17/17  Yes Arfeen, Arlyce Harman, MD  Skin Protectants, Misc. (BAZA PROTECT EX) Apply 1 application topically 2 (two) times daily. Apply to sacral (to dry skin)  Yes [provider]  traZODone (DESYREL) 50 MG tablet Take 1 tablet (50 mg total) by mouth at bedtime. 01/17/17  Yes Arfeen, Arlyce Harman, MD  ciprofloxacin (CIPRO) 500 MG tablet Take 1 tablet (500 mg total) by mouth 2 (two) times daily. 12/12/16   Ezequiel Essex, MD    Family History Family History  Problem Relation Age of Onset  . Depression Father   . Cancer Mother   . Heart disease Mother     Social History Social History  Substance Use Topics  . Smoking status: Never Smoker  . Smokeless tobacco: Never Used  . Alcohol use No     Comment: occasional     Allergies   Plant sterols and stanols   Review of Systems Review of Systems ROS reviewed and all are negative for acute change except as noted in the HPI.  Physical Exam Updated Vital Signs BP 140/71 (BP Location: Left Arm)   Pulse (!) 57   Temp 98.4 F (36.9 C) (Oral)   Resp 16   Ht 5\' 10"  (1.778 m)   Wt 70.3 kg (155 lb)   SpO2 98%   BMI 22.24 kg/m   Physical Exam  Constitutional: He is oriented to person, place, and time. Vital signs are normal. He  appears well-developed and well-nourished.  HENT:  Head: Normocephalic and atraumatic.  Right Ear: Hearing normal.  Left Ear: Hearing normal.  Eyes: Conjunctivae and EOM are normal. Pupils are equal, round, and reactive to light.  Neck: Normal range of motion. Neck supple.  Cardiovascular: Normal rate, regular rhythm, normal heart sounds and intact distal pulses.   Pulmonary/Chest: Effort normal and breath sounds normal.  Abdominal: Soft. There is no tenderness. There is no rigidity, no rebound, no guarding, no CVA tenderness, no tenderness at McBurney's point and negative Murphy's sign.  Genitourinary:  Genitourinary Comments: Foley catheter in place. No erythema. No leakage. No signs of infection. Draining properly. No odor noted.   Musculoskeletal: Normal range of motion.  Neurological: He is alert and oriented to person, place, and time.  Skin: Skin is warm and dry.  Psychiatric: He has a normal mood and affect. His speech is normal and behavior is normal. Thought content normal.  Nursing note and vitals reviewed.    ED Treatments / Results  Labs (all labs ordered are listed, but only abnormal results are displayed) Labs Reviewed - No data to display  EKG  EKG Interpretation None       Radiology No results found.  Procedures Procedures (including critical care time)  Medications Ordered in ED Medications - No data to display   Initial Impression / Assessment and Plan / ED Course  I have reviewed the triage vital signs and the nursing notes.  Pertinent labs & imaging results that were available during my care of the patient were reviewed by me and considered in my medical decision making (see chart for details).  Final Clinical Impressions(s) / ED Diagnoses     {I have reviewed the relevant previous healthcare records.  {I obtained HPI from historian. {Patient discussed with supervising physician.  ED Course:  Assessment: Pt is a 82 y.o. male with hx DM2, BPH,  HTN who presents with foley catheter problem. Staff noted leakage from foley and sent to ED. Pt denies symptoms. No abdominal pain. No dysuria. No odor. Notes drainage from foley and is adequate. Nursing noted 400cc in leg back. Likely backflow from overburdened foley bag. On exam, pt in NAD. Nontoxic/nonseptic appearing. VSS.  Afebrile. Lungs CTA. Heart RRR. Abdomen nontender soft. Seen by supervising physician. Agreed for DC. Pt had foley changed last week. Plan is to DC home with follow up to PCP. At time of discharge, Patient is in no acute distress. Vital Signs are stable. Patient is able to ambulate. Patient able to tolerate PO.   Disposition/Plan:  DC Home Additional Verbal discharge instructions given and discussed with patient.  Pt Instructed to f/u with PCP in the next week for evaluation and treatment of symptoms. Return precautions given Pt acknowledges and agrees with plan  Supervising Physician Tegeler, Gwenyth Allegra, *  Final diagnoses:  Problem with Foley catheter, initial encounter [facility])    New Prescriptions New Prescriptions   No medications on file     Shary Decamp, Hershal Coria 05/09/17 1631    Tegeler, Gwenyth Allegra, MD 05/16/17 1240

## 2017-05-09 NOTE — ED Triage Notes (Signed)
Per EMS- patient is from the [facility]. Patient c/o intermittent pain from the foley cath and then stated he feels like his catheter is falling out.

## 2017-05-09 NOTE — ED Triage Notes (Signed)
Pt continues to be anxious regarding the wait for transport. Continue to explain the delay.

## 2017-05-09 NOTE — ED Triage Notes (Signed)
Pt states he is not having any problems with his cath, noted the leg bag was completely full. Emptied 400 cc yellow urine from bag.

## 2017-05-09 NOTE — ED Notes (Signed)
Bed: AN19 Expected date:  Expected time:  Means of arrival:  Comments: 82 yo catheter pain

## 2017-05-09 NOTE — Discharge Instructions (Signed)
Please read and follow all provided instructions.  Your diagnoses today include:  1. Problem with Foley catheter, initial encounter [facility])     Tests performed today include: Vital signs. See below for your results today.   Medications prescribed:  Take as prescribed   Home care instructions:  Follow any educational materials contained in this packet.  Follow-up instructions: Please follow-up with your primary care provider for further evaluation of symptoms and treatment   Return instructions:  Please return to the Emergency Department if you do not get better, if you get worse, or new symptoms OR  - Fever (temperature greater than 101.84F)  - Bleeding that does not stop with holding pressure to the area    -Severe pain (please note that you may be more sore the day after your accident)  - Chest Pain  - Difficulty breathing  - Severe nausea or vomiting  - Inability to tolerate food and liquids  - Passing out  - Skin becoming red around your wounds  - Change in mental status (confusion or lethargy)  - New numbness or weakness    Please return if you have any other emergent concerns.  Additional Information:  Your vital signs today were: BP 140/71 (BP Location: Left Arm)    Pulse (!) 57    Temp 98.4 F (36.9 C) (Oral)    Resp 16    Ht 5\' 10"  (1.778 m)    Wt 70.3 kg (155 lb)    SpO2 98%    BMI 22.24 kg/m  If your blood pressure (BP) was elevated above 135/85 this visit, please have this repeated by your doctor within one month. ---------------

## 2017-05-21 ENCOUNTER — Inpatient Hospital Stay
Admission: RE | Admit: 2017-05-21 | Discharge: 2017-05-21 | Disposition: A | Discharge status: Home / Self Care | Payer: Self-pay | Source: Ambulatory Visit | Attending: Neurology | Admitting: Neurology

## 2017-05-21 NOTE — Discharge Instructions (Signed)
Lumbar Puncture Discharge Instructions ° °1. Go home and rest quietly for the next 24 hours.  It is important to lie flat for the next 24 hours.  Get up only to go to the restroom.  You may lie in the bed or on a couch on your back, your stomach, your left side or your right side.  You may have one pillow under your head.  You may have pillows between your knees while you are on your side or under your knees while you are on your back. ° °2. DO NOT drive today.  Recline the seat as far back as it will go, while still wearing your seat belt, on the way home. ° °3. You may get up to go to the bathroom as needed.  You may sit up for 10 minutes to eat.  You may resume your normal diet and medications unless otherwise indicated.  Drink lots of extra fluids today and tomorrow. ° °4. The incidence of headache, nausea, or vomiting is about 5% (one in 20 patients).  If you develop a headache, lie flat and drink plenty of fluids until the headache goes away.  Caffeinated beverages may be helpful.  If you develop severe nausea and vomiting or a headache that does not go away with flat bed rest, call the physician who sent you here.  ° °5. You may resume normal activities after your 24 hours of bed rest is over; however, do not exert yourself strongly or do any heavy lifting tomorrow. ° °6. Call your physician for a follow-up appointment.  ° °7. If you have any questions  after you arrive home, please call [phone]. ° °Discharge instructions have been explained to the patient.  The patient, or the person responsible for the patient, fully understands these instructions. ° ° °MAY RESUME PLAVIX TODAY. °

## 2017-05-22 ENCOUNTER — Ambulatory Visit
Admission: RE | Admit: 2017-05-22 | Discharge: 2017-05-22 | Disposition: A | Discharge status: Home / Self Care | Payer: Medicare Other | Source: Ambulatory Visit | Attending: Neurology | Admitting: Neurology

## 2017-05-22 ENCOUNTER — Telehealth: Payer: Self-pay | Admitting: *Deleted

## 2017-05-22 DIAGNOSIS — R269 Unspecified abnormalities of gait and mobility: Secondary | ICD-10-CM

## 2017-05-22 LAB — CSF CELL COUNT WITH DIFFERENTIAL
RBC Count, CSF: 0 cells/uL (ref 0–10)
WBC, CSF: 2 cells/uL (ref 0–5)

## 2017-05-22 LAB — PROTEIN, CSF: TOTAL PROTEIN, CSF: 68 mg/dL — AB (ref 15–60)

## 2017-05-22 LAB — GLUCOSE, CSF: GLUCOSE CSF: 65 mg/dL (ref 43–76)

## 2017-05-22 NOTE — Telephone Encounter (Signed)
Called and spoke with POA. Scheduled appt tomorrow at 430pm, check in 400pm for timed walking eval. She verbalized understanding.

## 2017-05-22 NOTE — Telephone Encounter (Signed)
-----   Message from Kathrynn Ducking, MD sent at 05/22/2017  4:21 PM EDT ----- This patient had a spinal tap today, he will need to be seen tomorrow at the end of the day for a walking evaluation.

## 2017-05-23 ENCOUNTER — Ambulatory Visit (INDEPENDENT_AMBULATORY_CARE_PROVIDER_SITE_OTHER): Payer: BLUE CROSS/BLUE SHIELD | Admitting: Neurology

## 2017-05-23 ENCOUNTER — Encounter: Payer: Self-pay | Admitting: Neurology

## 2017-05-23 VITALS — BP 157/81 | HR 56

## 2017-05-23 DIAGNOSIS — R269 Unspecified abnormalities of gait and mobility: Secondary | ICD-10-CM

## 2017-05-23 NOTE — Telephone Encounter (Signed)
Spoke with Anda Kraft from [facility]. They are having transportation issues. Advised per CW,MD he would like to see him today. If this is not possible, we can move his appt to tomorrow. She will call back if they are unable to get pt to appt today.

## 2017-05-23 NOTE — Progress Notes (Signed)
Reason for visit: Gait disorder  [name]. is an 82 y.o. male  History of present illness:  [name] is an 82 year old right-handed white male with a history of a severe gait disorder. The patient has undergone a lumbar puncture yesterday with a high-volume tap to look for the possibility of normal pressure hydrocephalus. The patient returns today for a walking evaluation. The patient has not been ambulating at the extended care facility, physical therapy is no longer being done secondary to lack of progression.  Past Medical History:  Diagnosis Date  . Anxiety   . Arthritis   . Benign prostate hyperplasia   . Bipolar disorder ([place])   . C2 cervical fracture ([name])    due to pt fall  . Chronic indwelling Foley catheter   . Depression   . Diabetes mellitus type II   . Falls   . Flu   . Hearing aid worn   . Hypertension   . Memory difficulty 08/21/2014  . Memory loss   . MRSA infection (methicillin-resistant Staphylococcus aureus)   . Neuromuscular disorder ([place])    parkinsons  . Parkinson disease ([place])   . Pneumonia   . SIRS (systemic inflammatory response syndrome) ([name])   . UTI (urinary tract infection)     Past Surgical History:  Procedure Laterality Date  . TOTAL HIP ARTHROPLASTY    . VIDEO ASSISTED THORACOSCOPY (VATS)/EMPYEMA  12/16/2012   Procedure: VIDEO ASSISTED THORACOSCOPY (VATS)/EMPYEMA;  Surgeon: Melrose Nakayama, MD;  Location: [facility];  Service: Thoracic;  Laterality: Right;  Marland Kitchen VIDEO BRONCHOSCOPY  12/16/2012   Procedure: VIDEO BRONCHOSCOPY;  Surgeon: Melrose Nakayama, MD;  Location: [facility];  Service: Thoracic;  Laterality: Right;    Family History  Problem Relation Age of Onset  . Depression Father   . Cancer Mother   . Heart disease Mother     Social history:  reports that he has never smoked. He has never used smokeless tobacco. He reports that he does not drink alcohol or use drugs.    Allergies  Allergen Reactions  . Plant  Sterols And Stanols Other (See Comments)    ON MAR    Medications:  Prior to Admission medications   Medication Sig Start Date End Date Taking? Authorizing Provider  acetaminophen (TYLENOL) 325 MG tablet Take 650 mg by mouth every 6 (six) hours as needed for mild pain.   Yes [provider]  aspirin 81 MG chewable tablet Chew 81 mg by mouth daily.   Yes [provider]  belladonna-opium (B&O SUPPRETTES) 16.2-30 MG suppository Place 1 suppository rectally every 8 (eight) hours as needed for pain. 3/89/37  Yes Delora Fuel, MD  bisacodyl (DULCOLAX) 10 MG suppository Place 10 mg rectally as needed for moderate constipation.   Yes [provider]  carbidopa-levodopa (SINEMET IR) 25-250 MG per tablet Take 1 tablet by mouth 3 (three) times daily.   Yes [provider]  ciprofloxacin (CIPRO) 500 MG tablet Take 1 tablet (500 mg total) by mouth 2 (two) times daily. 12/12/16  Yes Rancour, Annie Main, MD  clonazePAM (KLONOPIN) 0.5 MG tablet Take 0.5 tablets (0.25 mg total) by mouth daily as needed for anxiety. 01/17/17  Yes Arfeen, Arlyce Harman, MD  clopidogrel (PLAVIX) 75 MG tablet Take 1 tablet (75 mg total) by mouth daily. Patient taking differently: Take 75 mg by mouth every evening.  10/07/15  Yes Florencia Reasons, MD  entacapone (COMTAN) 200 MG tablet Take 200 mg by mouth 3 (  three) times daily.   Yes [provider]  feeding supplement, GLUCERNA SHAKE, (GLUCERNA SHAKE) LIQD Take 237 mLs by mouth 2 (two) times daily between meals. 03/16/16  Yes Kelvin Cellar, MD  ferrous sulfate 325 (65 FE) MG tablet Take 325 mg by mouth 2 (two) times daily.   Yes [provider]  guaifenesin (ROBITUSSIN) 100 MG/5ML syrup Take 200 mg by mouth 3 (three) times daily as needed for cough.   Yes [provider]  hydrALAZINE (APRESOLINE) 25 MG tablet Take 25 mg by mouth 2 (two) times daily.    Yes [provider]  isosorbide mononitrate (IMDUR) 30 MG 24 hr tablet Take  1 tablet (30 mg total) by mouth daily. 10/07/15  Yes Florencia Reasons, MD  lamoTRIgine (LAMICTAL) 150 MG tablet Take 1 tablet (150 mg total) by mouth 2 (two) times daily. 01/17/17  Yes Arfeen, Arlyce Harman, MD  lisinopril (PRINIVIL,ZESTRIL) 10 MG tablet Take 0.5 tablets (5 mg total) by mouth daily. 10/07/15  Yes Florencia Reasons, MD  loratadine (CLARITIN) 10 MG tablet Take 10 mg by mouth daily as needed for allergies.   Yes [provider]  Maltodextrin-Xanthan Gum (RESOURCE THICKENUP CLEAR) POWD Take 120 g by mouth as needed. Patient taking differently: Take 1 Can by mouth daily as needed (takes 120mg  as needed food thickener).  10/07/15  Yes Florencia Reasons, MD  Multiple Vitamin (MULTIVITAMIN WITH MINERALS) TABS tablet Take 1 tablet by mouth daily.   Yes [provider]  pantoprazole (PROTONIX) 40 MG tablet Take 40 mg by mouth daily. Reported on 07/06/2016   Yes [provider]  polyethylene glycol (MIRALAX / GLYCOLAX) packet Take 17 g by mouth daily.   Yes [provider]  predniSONE (DELTASONE) 2.5 MG tablet Take 2.5 mg by mouth daily with breakfast.   Yes [provider]  QUEtiapine (SEROQUEL) 25 MG tablet Take 3 tablets (75 mg total) by mouth at bedtime. 01/17/17  Yes Arfeen, Arlyce Harman, MD  rosuvastatin (CRESTOR) 20 MG tablet Take 20 mg by mouth daily.   Yes [provider]  sertraline (ZOLOFT) 100 MG tablet Take 2 tablets (200 mg total) by mouth daily. 01/17/17  Yes Arfeen, Arlyce Harman, MD  Skin Protectants, Misc. (BAZA PROTECT EX) Apply 1 application topically 2 (two) times daily. Apply to sacral (to dry skin)   Yes [provider]  traZODone (DESYREL) 50 MG tablet Take 1 tablet (50 mg total) by mouth at bedtime. 01/17/17  Yes Arfeen, Arlyce Harman, MD    ROS:  Out of a complete 14 system review of symptoms, the patient complains only of the following symptoms, and all other reviewed systems are negative.  Gait disorder Memory problem  Blood pressure (!) 157/81, pulse  (!) 56, SpO2 95 %.  Physical Exam  General: The patient is alert and cooperative at the time of the examination.  Skin: No significant peripheral edema is noted.   Neurologic Exam   Cranial nerves: Facial symmetry is present. Speech is normal, no aphasia or dysarthria is noted. Extraocular movements are full. Visual fields are full.  Gait and station: The patient requires assistance with standing, he has a tendency to lean backwards. He is able to straighten his legs and take a few steps with the examiner, the balance is poor. Romberg is positive, the patient goes backwards. The patient is able to stand with his eyes open and not fall over.  Reflexes: Deep tendon reflexes are symmetric.   Assessment/Plan:  1. Gait disturbance  Following the spinal tap, the evaluation of the gait today is better was when I examined him in April. I will have him come back in about 3 or 4 weeks and look at the gait evaluation again. If the gait reverts back to what it had been previously, I will be more certain that the spinal tap improved his symptoms. Review of prior CT and MRI brain evaluations does show diffuse cortical atrophy that could explain the degree of ventriculomegally seen.  Jill Alexanders MD 05/23/2017 4:44 PM  [facility] [address] [place], [place] [address]  Phone [phone] Fax [phone]

## 2017-05-23 NOTE — Telephone Encounter (Signed)
Kate-RN/[name] called reg appt today and transportation issues.  Call was transferred to RN

## 2017-06-13 ENCOUNTER — Emergency Department (HOSPITAL_COMMUNITY)
Admission: EM | Admit: 2017-06-13 | Discharge: 2017-06-14 | Disposition: A | Discharge status: Other Acute Inpatient Hospital | Payer: Medicare Other | Attending: Emergency Medicine | Admitting: Emergency Medicine

## 2017-06-13 DIAGNOSIS — Z79899 Other long term (current) drug therapy: Secondary | ICD-10-CM | POA: Insufficient documentation

## 2017-06-13 DIAGNOSIS — F0390 Unspecified dementia without behavioral disturbance: Secondary | ICD-10-CM | POA: Insufficient documentation

## 2017-06-13 DIAGNOSIS — Y939 Activity, unspecified: Secondary | ICD-10-CM | POA: Insufficient documentation

## 2017-06-13 DIAGNOSIS — Y999 Unspecified external cause status: Secondary | ICD-10-CM | POA: Insufficient documentation

## 2017-06-13 DIAGNOSIS — Z7902 Long term (current) use of antithrombotics/antiplatelets: Secondary | ICD-10-CM | POA: Insufficient documentation

## 2017-06-13 DIAGNOSIS — Y92129 Unspecified place in nursing home as the place of occurrence of the external cause: Secondary | ICD-10-CM | POA: Insufficient documentation

## 2017-06-13 DIAGNOSIS — I110 Hypertensive heart disease with heart failure: Secondary | ICD-10-CM | POA: Insufficient documentation

## 2017-06-13 DIAGNOSIS — W19XXXA Unspecified fall, initial encounter: Secondary | ICD-10-CM | POA: Insufficient documentation

## 2017-06-13 DIAGNOSIS — E119 Type 2 diabetes mellitus without complications: Secondary | ICD-10-CM | POA: Insufficient documentation

## 2017-06-13 DIAGNOSIS — G20 Parkinson's disease: Secondary | ICD-10-CM | POA: Insufficient documentation

## 2017-06-13 DIAGNOSIS — I5032 Chronic diastolic (congestive) heart failure: Secondary | ICD-10-CM | POA: Insufficient documentation

## 2017-06-13 DIAGNOSIS — S0181XA Laceration without foreign body of other part of head, initial encounter: Secondary | ICD-10-CM

## 2017-06-13 DIAGNOSIS — T07XXXA Unspecified multiple injuries, initial encounter: Secondary | ICD-10-CM | POA: Insufficient documentation

## 2017-06-13 MED ORDER — LIDOCAINE-EPINEPHRINE-TETRACAINE (LET) SOLUTION
3.0000 mL | Freq: Once | NASAL | Status: AC
  Administered 2017-06-13: 3 mL via TOPICAL
  Filled 2017-06-13: qty 3

## 2017-06-13 MED ORDER — "THROMBI-PAD 3""X3"" EX PADS"
1.0000 | MEDICATED_PAD | Freq: Once | CUTANEOUS | Status: AC
  Administered 2017-06-13: 1 via TOPICAL
  Filled 2017-06-13: qty 1

## 2017-06-13 NOTE — ED Notes (Signed)
FALL RISK band and yellow socks were placed on the patient.

## 2017-06-13 NOTE — ED Provider Notes (Signed)
[facility] DEPT Provider Note   CSN: [id number] Arrival date & time: 06/13/17  2222     History   Chief Complaint Chief Complaint  Patient presents with  . Fall    HPI [name]. is a 82 y.o. male.  Patient is a resident of [facility] with history of HTN, T2DM, chronic indwelling Foley catheter, Parkinson's, dementia presents after an unwitnessed fall. The patient has no complaint of pain. Per EMS report, he has abrasions to left face, left elbow and knee.  The patient reports he falls frequently. He is anticoagulated with Plavix.    The history is provided by the patient. No language interpreter was used.    Past Medical History:  Diagnosis Date  . Anxiety   . Arthritis   . Benign prostate hyperplasia   . Bipolar disorder ([place])   . C2 cervical fracture ([name])    due to pt fall  . Chronic indwelling Foley catheter   . Depression   . Diabetes mellitus type II   . Falls   . Flu   . Hearing aid worn   . Hypertension   . Memory difficulty 08/21/2014  . Memory loss   . MRSA infection (methicillin-resistant Staphylococcus aureus)   . Neuromuscular disorder ([place])    parkinsons  . Parkinson disease ([place])   . Pneumonia   . SIRS (systemic inflammatory response syndrome) ([name])   . UTI (urinary tract infection)     Patient Active Problem List   Diagnosis Date Noted  . CAP (community acquired pneumonia) 11/29/2016  . Bacteremia 03/14/2016  . HCAP (healthcare-associated pneumonia)   . Pressure ulcer 10/01/2015  . Right lower lobe pneumonia ([place]) 10/01/2015  . Sepsis due to Gram-negative organism with acute respiratory failure ([place]) 10/01/2015  . Acute renal failure ([place]) 10/01/2015  . Acute respiratory failure with hypoxia ([place]) 09/30/2015  . Influenza A (H1N1) 03/25/2015  . Dementia without behavioral disturbance 03/25/2015  . Hyperkalemia 03/25/2015  . Fall 03/16/2015  . Sacral pressure ulcer 03/16/2015  . Fever 03/16/2015  . Memory difficulty  08/21/2014  . Malnutrition of moderate degree ([place]) 04/19/2014  . UTI (lower urinary tract infection) 04/18/2014  . Hypoxia 04/18/2014  . PNA (pneumonia) 04/18/2014  . Chronic diastolic congestive heart failure ([place]) 02/01/2013  . Pericardial effusion 02/01/2013  . Bradycardia 01/31/2013  . Empyema lung ([place]) 12/23/2012  . S/P thoracotomy 12/23/2012  . Abnormality of gait 12/05/2012  . Hypernatremia 08/13/2012  . Hypertension   . Diabetes mellitus, type 2 ([place])   . Arthritis   . Bipolar disorder ([place])   . Anxiety   . Chronic indwelling Foley catheter   . Neuromuscular disorder ([place])   . Bacteremia of undetermined etiology 06/17/2012  . Parkinson disease ([place]) 04/16/2012  . Elevated troponin 04/16/2012  . Hearing aid worn   . Diabetes mellitus ([place]) 07/06/2011    Past Surgical History:  Procedure Laterality Date  . TOTAL HIP ARTHROPLASTY    . VIDEO ASSISTED THORACOSCOPY (VATS)/EMPYEMA  12/16/2012   Procedure: VIDEO ASSISTED THORACOSCOPY (VATS)/EMPYEMA;  Surgeon: Melrose Nakayama, MD;  Location: [facility];  Service: Thoracic;  Laterality: Right;  Marland Kitchen VIDEO BRONCHOSCOPY  12/16/2012   Procedure: VIDEO BRONCHOSCOPY;  Surgeon: Melrose Nakayama, MD;  Location: [facility];  Service: Thoracic;  Laterality: Right;       Home Medications    Prior to Admission medications   Medication Sig Start Date End Date Taking? Authorizing Provider  aspirin 81 MG chewable tablet Chew 81 mg  by mouth daily.    [provider]  belladonna-opium (B&O SUPPRETTES) 16.2-30 MG suppository Place 1 suppository rectally every 8 (eight) hours as needed for pain. 06/12/93   Delora Fuel, MD  bisacodyl (DULCOLAX) 10 MG suppository Place 10 mg rectally as needed for moderate constipation.    [provider]  carbidopa-levodopa (SINEMET IR) 25-250 MG per tablet Take 1 tablet by mouth 3 (three) times daily.    [provider]  ciprofloxacin (CIPRO) 500 MG tablet Take 1 tablet (500 mg  total) by mouth 2 (two) times daily. 12/12/16   Rancour, Annie Main, MD  clonazePAM (KLONOPIN) 0.5 MG tablet Take 0.5 tablets (0.25 mg total) by mouth daily as needed for anxiety. 01/17/17   Arfeen, Arlyce Harman, MD  clopidogrel (PLAVIX) 75 MG tablet Take 1 tablet (75 mg total) by mouth daily. Patient taking differently: Take 75 mg by mouth every evening.  10/07/15   Florencia Reasons, MD  entacapone (COMTAN) 200 MG tablet Take 200 mg by mouth 3 (three) times daily.    [provider]  feeding supplement, GLUCERNA SHAKE, (GLUCERNA SHAKE) LIQD Take 237 mLs by mouth 2 (two) times daily between meals. 03/16/16   Kelvin Cellar, MD  ferrous sulfate 325 (65 FE) MG tablet Take 325 mg by mouth 2 (two) times daily.    [provider]  guaifenesin (ROBITUSSIN) 100 MG/5ML syrup Take 200 mg by mouth 3 (three) times daily as needed for cough.    [provider]  hydrALAZINE (APRESOLINE) 25 MG tablet Take 25 mg by mouth 2 (two) times daily.     [provider]  isosorbide mononitrate (IMDUR) 30 MG 24 hr tablet Take 1 tablet (30 mg total) by mouth daily. 10/07/15   Florencia Reasons, MD  lamoTRIgine (LAMICTAL) 150 MG tablet Take 1 tablet (150 mg total) by mouth 2 (two) times daily. 01/17/17   Arfeen, Arlyce Harman, MD  lisinopril (PRINIVIL,ZESTRIL) 10 MG tablet Take 0.5 tablets (5 mg total) by mouth daily. 10/07/15   Florencia Reasons, MD  loratadine (CLARITIN) 10 MG tablet Take 10 mg by mouth daily as needed for allergies.    [provider]  Maltodextrin-Xanthan Gum (RESOURCE THICKENUP CLEAR) POWD Take 120 g by mouth as needed. Patient taking differently: Take 1 Can by mouth daily as needed (takes 120mg  as needed food thickener).  10/07/15   Florencia Reasons, MD  Multiple Vitamin (MULTIVITAMIN WITH MINERALS) TABS tablet Take 1 tablet by mouth daily.    [provider]  pantoprazole (PROTONIX) 40 MG tablet Take 40 mg by mouth daily. Reported on 07/06/2016    [provider]  polyethylene glycol  (MIRALAX / GLYCOLAX) packet Take 17 g by mouth daily.    [provider]  predniSONE (DELTASONE) 2.5 MG tablet Take 2.5 mg by mouth daily with breakfast.    [provider]  QUEtiapine (SEROQUEL) 25 MG tablet Take 3 tablets (75 mg total) by mouth at bedtime. 01/17/17   Arfeen, Arlyce Harman, MD  rosuvastatin (CRESTOR) 20 MG tablet Take 20 mg by mouth daily.    [provider]  sertraline (ZOLOFT) 100 MG tablet Take 2 tablets (200 mg total) by mouth daily. 01/17/17   Arfeen, Arlyce Harman, MD  Skin Protectants, Misc. (BAZA PROTECT EX) Apply 1 application topically 2 (two) times daily. Apply to sacral (to dry skin)    [provider]  traZODone (DESYREL) 50 MG tablet Take 1 tablet (50 mg total) by mouth at bedtime. 01/17/17   Arfeen, Arlyce Harman,  MD    Family History Family History  Problem Relation Age of Onset  . Depression Father   . Cancer Mother   . Heart disease Mother     Social History Social History  Substance Use Topics  . Smoking status: Never Smoker  . Smokeless tobacco: Never Used  . Alcohol use No     Comment: occasional     Allergies   Plant sterols and stanols   Review of Systems Review of Systems  Unable to perform ROS: Dementia     Physical Exam Updated Vital Signs BP (!) 161/74 (BP Location: Right Arm)   Pulse (!) 58   Temp 97.5 F (36.4 C) (Oral)   Resp 16   Ht 5\' 9"  (1.753 m)   Wt 70.3 kg (155 lb)   SpO2 98%   BMI 22.89 kg/m   Physical Exam  Constitutional: He appears well-developed and well-nourished. No distress.  Awake, alert, in NAD.  HENT:  Head: Normocephalic.  Hematoma with overlying laceration to left cheek prominence. No bony tenderness.   Eyes: Conjunctivae and EOM are normal. Pupils are equal, round, and reactive to light.  Neck: Normal range of motion. Neck supple.  Cardiovascular: Normal rate.   Pulmonary/Chest: Effort normal. He exhibits no tenderness.  Abdominal: Soft. Bowel sounds are normal. There is no  tenderness. There is no rebound and no guarding.  Musculoskeletal: Normal range of motion.  FROM all extremities without apparent pain. No bony deformities. No midline cervical tenderness.   Neurological: He is alert. No sensory deficit.  Skin: Skin is warm and dry. No rash noted.  Small skin tear to left elbow with minimal bleeding. Abrasion to anterior left knee.   Psychiatric: He has a normal mood and affect.     ED Treatments / Results  Labs (all labs ordered are listed, but only abnormal results are displayed) Labs Reviewed - No data to display  EKG  EKG Interpretation None       Radiology No results found.  Procedures Procedures (including critical care time)  Medications Ordered in ED Medications  THROMBI-PAD 3"X3" pad 1 each (not administered)  lidocaine-EPINEPHrine-tetracaine (LET) solution (not administered)     Initial Impression / Assessment and Plan / ED Course  I have reviewed the triage vital signs and the nursing notes.  Pertinent labs & imaging results that were available during my care of the patient were reviewed by me and considered in my medical decision making (see chart for details).     Patient with dementia from [facility] after unwitnessed fall. In NAD, appears comfortable. No complaint of pain.  Wounds to elbow and knee treated with Thrombi-Pad to control slow but continuous bleeding. Wounds dressed.  Head CT negative for intracranial injury. Facial laceration cleaned and re-evaluated. No suture repair required.   The patient has no mental status changes on multiple re-evaluations. He is considered stable for discharge back to [facility].  Final Clinical Impressions(s) / ED Diagnoses   Final diagnoses:  None  1. Fall 2. Multiple abrasions 3. Facial contusion  New Prescriptions New Prescriptions   No medications on file     Charlann Lange, Hershal Coria 06/14/17 [place], [place], DO 06/14/17 1651

## 2017-06-13 NOTE — ED Triage Notes (Signed)
Ems pt from [name] pt was found in floor near restroom. Pt has abrasions to left knee, elbow and cheek. Pt is currently pain free.

## 2017-06-14 ENCOUNTER — Emergency Department (HOSPITAL_COMMUNITY): Payer: Medicare Other

## 2017-06-14 NOTE — ED Notes (Signed)
[facility] arrived to take pt back to [facility], pts belongings sent back to facility along with DNR paper.

## 2017-06-14 NOTE — ED Notes (Signed)
Wounds to left knee, elbow and face were cleaned and dressed. Soiled clothing was removed and pt was cleaned. Barrier cream applied to peri area

## 2017-06-14 NOTE — Discharge Instructions (Signed)
Thrombi-Pad was used to stop slow bleeding to abraded areas. These bandages do not need changing for 24 hours. Your head CT was negative for any deep injury. You can be discharged back to your residence. Follow up with your doctor as needed.

## 2017-07-05 ENCOUNTER — Telehealth: Payer: Self-pay | Admitting: Neurology

## 2017-07-05 ENCOUNTER — Ambulatory Visit: Payer: Medicare Other | Admitting: Neurology

## 2017-07-05 NOTE — Telephone Encounter (Signed)
[name] (Industrial/product designer) called office in reference to patient not able to come to appt today due to [facility] unable to bring him.  She is wanting to reschedule, but no availability.  Please call

## 2017-07-05 NOTE — Telephone Encounter (Signed)
Emma please disregard the previous message the appointment for today had already been canceled and rescheduled. Sorry.

## 2017-07-05 NOTE — Telephone Encounter (Signed)
Noted  

## 2017-07-17 ENCOUNTER — Ambulatory Visit (HOSPITAL_COMMUNITY): Payer: Self-pay | Admitting: Psychiatry

## 2017-07-17 ENCOUNTER — Encounter (HOSPITAL_COMMUNITY): Payer: Self-pay | Admitting: Psychiatry

## 2017-07-17 ENCOUNTER — Ambulatory Visit (INDEPENDENT_AMBULATORY_CARE_PROVIDER_SITE_OTHER): Payer: Medicare Other | Admitting: Psychiatry

## 2017-07-17 DIAGNOSIS — R413 Other amnesia: Secondary | ICD-10-CM | POA: Diagnosis not present

## 2017-07-17 DIAGNOSIS — Z818 Family history of other mental and behavioral disorders: Secondary | ICD-10-CM | POA: Diagnosis not present

## 2017-07-17 DIAGNOSIS — R5381 Other malaise: Secondary | ICD-10-CM

## 2017-07-17 DIAGNOSIS — H9190 Unspecified hearing loss, unspecified ear: Secondary | ICD-10-CM

## 2017-07-17 DIAGNOSIS — F05 Delirium due to known physiological condition: Secondary | ICD-10-CM

## 2017-07-17 DIAGNOSIS — F319 Bipolar disorder, unspecified: Secondary | ICD-10-CM | POA: Diagnosis not present

## 2017-07-17 MED ORDER — SERTRALINE HCL 100 MG PO TABS: 200.0000 mg | ORAL_TABLET | Freq: Every day | ORAL | 2 refills | Status: AC

## 2017-07-17 MED ORDER — LAMOTRIGINE 150 MG PO TABS: 150.0000 mg | ORAL_TABLET | Freq: Two times a day (BID) | ORAL | 2 refills | Status: AC

## 2017-07-17 MED ORDER — QUETIAPINE FUMARATE 25 MG PO TABS: 75.0000 mg | ORAL_TABLET | Freq: Every day | ORAL | 2 refills | Status: DC

## 2017-07-17 MED ORDER — CLONAZEPAM 0.5 MG PO TABS: 0.2500 mg | ORAL_TABLET | Freq: Every day | ORAL | 1 refills | Status: DC | PRN

## 2017-07-17 NOTE — Progress Notes (Signed)
[facility] MD/PA/NP OP Progress Note  07/17/2017 10:29 AM [name].  MRN:  [id number]  Chief Complaint:  Subjective:  I am doing so-so.  HPI: Patient came for his follow-up appointment.  He usually comes with his caretaker but today he came by himself.  He is using wheelchair because of leg weakness.  I have noticed that gradually his memory has declined.  He has difficulty remembering things.  Most of the information was obtained through electronic medical record.  Patient reported he sleeping good.  However his energy level is remaining low.  He denies any crying spells, irritability or any mania.  He does not like food at [facility] but he has no other choice.  He admitted that he tried to keep himself busy.  He mentioned his home health aid is pregnant and could not come.  Despite he does not like living in [facility] he has no major issues.  He is taking the medication and reported no side effects.  I reviewed medication list from the [facility].  His Seroquel has been reduced.  Apparently there has been recent fall and also UTI.  He was also seen by a neurologist for gait issues.  He has Parkinson and recently he has a workup for normal pressure hydrocephalus.  He has lumbar puncture.  He will see neurologist in a few weeks.  His major concern is repeated fall.  His vital signs are stable.  He denies any hallucination, paranoia, suicidal thoughts or homicidal thoughts.  He denies any crying spells.  There has been no recent agitation or any aggressive behavior.  Visit Diagnosis:    ICD-10-CM   1. Bipolar 1 disorder ([name]) F31.9 lamoTRIgine (LAMICTAL) 150 MG tablet    QUEtiapine (SEROQUEL) 25 MG tablet    sertraline (ZOLOFT) 100 MG tablet    clonazePAM (KLONOPIN) 0.5 MG tablet    Past Psychiatric History: Reviewed. Patient has history of bipolar disorder. He has multiple psychiatric admission due to decompensation and noncompliance of medication. Patient has history of  aggression and impulsive behavior. His last psychiatric admission was in 2005.  Past Medical History:  Past Medical History:  Diagnosis Date  . Anxiety   . Arthritis   . Benign prostate hyperplasia   . Bipolar disorder ([place])   . C2 cervical fracture ([name])    due to pt fall  . Chronic indwelling Foley catheter   . Depression   . Diabetes mellitus type II   . Falls   . Flu   . Hearing aid worn   . Hypertension   . Memory difficulty 08/21/2014  . Memory loss   . MRSA infection (methicillin-resistant Staphylococcus aureus)   . Neuromuscular disorder ([place])    parkinsons  . Parkinson disease ([place])   . Pneumonia   . SIRS (systemic inflammatory response syndrome) ([name])   . UTI (urinary tract infection)     Past Surgical History:  Procedure Laterality Date  . TOTAL HIP ARTHROPLASTY    . VIDEO ASSISTED THORACOSCOPY (VATS)/EMPYEMA  12/16/2012   Procedure: VIDEO ASSISTED THORACOSCOPY (VATS)/EMPYEMA;  Surgeon: Melrose Nakayama, MD;  Location: [facility];  Service: Thoracic;  Laterality: Right;  Marland Kitchen VIDEO BRONCHOSCOPY  12/16/2012   Procedure: VIDEO BRONCHOSCOPY;  Surgeon: Melrose Nakayama, MD;  Location: [facility];  Service: Thoracic;  Laterality: Right;    Family Psychiatric History: Reviewed.  Family History:  Family History  Problem Relation Age of Onset  . Depression Father   . Cancer Mother   .  Heart disease Mother     Social History:  Social History   Social History  . Marital status: Divorced    Spouse name: N/A  . Number of children: 2  . Years of education: college 1   Occupational History  . Retired    Social History Main Topics  . Smoking status: Never Smoker  . Smokeless tobacco: Never Used  . Alcohol use No     Comment: occasional  . Drug use: No  . Sexual activity: Not Currently   Other Topics Concern  . None   Social History Narrative   Patient is right handed.   Patient drinks 1 cup of caffeine daily.    Allergies:  Allergies  Allergen  Reactions  . Plant Sterols And Stanols Other (See Comments)    ON MAR    Metabolic Disorder Labs: Lab Results  Component Value Date   HGBA1C 5.6 09/30/2015   MPG 114 09/30/2015   MPG 111 03/16/2015   No results found for: PROLACTIN No results found for: CHOL, TRIG, HDL, CHOLHDL, VLDL, LDLCALC   Current Medications: Current Outpatient Prescriptions  Medication Sig Dispense Refill  . aspirin 81 MG chewable tablet Chew 81 mg by mouth daily.    . belladonna-opium (B&O SUPPRETTES) 16.2-30 MG suppository Place 1 suppository rectally every 8 (eight) hours as needed for pain. 15 suppository 0  . bisacodyl (DULCOLAX) 10 MG suppository Place 10 mg rectally as needed for moderate constipation.    . carbidopa-levodopa (SINEMET IR) 25-250 MG per tablet Take 1 tablet by mouth 3 (three) times daily.    . clonazePAM (KLONOPIN) 0.5 MG tablet Take 0.5 tablets (0.25 mg total) by mouth daily as needed for anxiety. 30 tablet 1  . clopidogrel (PLAVIX) 75 MG tablet Take 1 tablet (75 mg total) by mouth daily. (Patient taking differently: Take 75 mg by mouth every evening. ) 30 tablet 0  . entacapone (COMTAN) 200 MG tablet Take 200 mg by mouth 3 (three) times daily.    . ferrous sulfate 325 (65 FE) MG tablet Take 325 mg by mouth 2 (two) times daily.    . food thickener (THICK IT) POWD Take 0.12 g by mouth as needed.    Marland Kitchen GLUCERNA (GLUCERNA) LIQD Take 237 mLs by mouth 2 (two) times daily between meals.    Marland Kitchen guaifenesin (ROBITUSSIN) 100 MG/5ML syrup Take 200 mg by mouth 3 (three) times daily as needed for cough.    . hydrALAZINE (APRESOLINE) 25 MG tablet Take 25 mg by mouth 2 (two) times daily.     . isosorbide mononitrate (IMDUR) 30 MG 24 hr tablet Take 1 tablet (30 mg total) by mouth daily. 30 tablet 0  . ketoconazole (NIZORAL) 2 % cream Apply 1 application topically daily as needed for irritation.    Marland Kitchen lamoTRIgine (LAMICTAL) 150 MG tablet Take 1 tablet (150 mg total) by mouth 2 (two) times daily. 60 tablet 2   . loratadine (CLARITIN) 10 MG tablet Take 10 mg by mouth daily as needed for allergies.    . Multiple Vitamin (MULTIVITAMIN WITH MINERALS) TABS tablet Take 1 tablet by mouth daily.    . pantoprazole (PROTONIX) 40 MG tablet Take 40 mg by mouth daily. Reported on 07/06/2016    . polyethylene glycol (MIRALAX / GLYCOLAX) packet Take 17 g by mouth daily.    . predniSONE (DELTASONE) 2.5 MG tablet Take 2.5 mg by mouth daily with breakfast.    . QUEtiapine (SEROQUEL) 25 MG tablet Take 3 tablets (75 mg  total) by mouth at bedtime. (Patient taking differently: Take 100 mg by mouth at bedtime. ) 90 tablet 2  . rosuvastatin (CRESTOR) 20 MG tablet Take 20 mg by mouth daily.    . Skin Protectants, Misc. (BAZA PROTECT EX) Apply 1 application topically 3 (three) times daily as needed (for dermatitis). Apply to sacral (to dry skin)    . Skin Protectants, Misc. (BAZA PROTECT EX) Apply topically 2 (two) times daily.    . traZODone (DESYREL) 50 MG tablet Take 1 tablet (50 mg total) by mouth at bedtime. 30 tablet 2  . ciprofloxacin (CIPRO) 500 MG tablet Take 1 tablet (500 mg total) by mouth 2 (two) times daily. (Patient not taking: Reported on 06/13/2017) 14 tablet 0  . feeding supplement, GLUCERNA SHAKE, (GLUCERNA SHAKE) LIQD Take 237 mLs by mouth 2 (two) times daily between meals. (Patient not taking: Reported on 06/13/2017) 30 Can 0  . lisinopril (PRINIVIL,ZESTRIL) 10 MG tablet Take 0.5 tablets (5 mg total) by mouth daily. (Patient not taking: Reported on 06/13/2017) 30 tablet 0  . Maltodextrin-Xanthan Gum (RESOURCE THICKENUP CLEAR) POWD Take 120 g by mouth as needed. (Patient not taking: Reported on 06/13/2017) 30 Can 0  . sertraline (ZOLOFT) 100 MG tablet Take 2 tablets (200 mg total) by mouth daily. (Patient not taking: Reported on 06/13/2017) 60 tablet 2   No current facility-administered medications for this visit.     Neurologic: Headache: No Seizure: No Paresthesias: No  Musculoskeletal: Strength & Muscle  Tone: decreased Gait & Station: unable to stand Patient leans: Patient uses wheelchair  Psychiatric Specialty Exam: Review of Systems  Constitutional: Positive for malaise/fatigue.  HENT: Positive for hearing loss.   Respiratory: Negative.   Cardiovascular: Negative.   Musculoskeletal: Positive for back pain.  Skin: Negative.  Negative for itching and rash.  Psychiatric/Behavioral: Positive for memory loss.    Blood pressure 122/74, pulse (!) 56, height 5\' 10"  (1.778 m), weight 160 lb (72.6 kg).Body mass index is 22.96 kg/m.  General Appearance: Casual and Fairly Groomed  Eye Contact:  Fair  Speech:  Slow  Volume:  Decreased  Mood:  Euthymic  Affect:  Restricted  Thought Process:  Descriptions of Associations: Circumstantial  Orientation:  Other:  Oriented to person and place  Thought Content: Poverty of thought content.  No paranoia or delusion.   Suicidal Thoughts:  No  Homicidal Thoughts:  No  Memory:  Immediate;   Fair Recent;   Poor Remote;   Fair  Judgement:  Fair  Insight:  Fair  Psychomotor Activity:  Decreased  Concentration:  Concentration: Fair and Attention Span: Fair  Recall:  Poor  Fund of Knowledge: Fair  Language: Fair  Akathisia:  No  Handed:  Right  AIMS (if indicated):  0  Assets:  Desire for Improvement Housing Social Support  ADL's:  Intact  Cognition: Impaired,  Mild  Sleep:  Fair     Assessment: Bipolar disorder type I.  Cognitive disorder NOS.  Plan: I reviewed records from other provider and recent blood work results.  I believe his cognition is slowly and gradually declining.  Patient has multiple health issues including Parkinson disease.  He is seeing a neurologist regularly.  He is taking multiple medication but patient is very reluctant to cut down the dosage of the psychiatric medication.  I will continue Klonopin 0.25 mg as needed for severe anxiety.  Continue Lamictal 150 mg twice a day, Seroquel 75 mg at bedtime, Zoloft 200 mg  daily and trazodone 50 mg  at bedtime.  Discussed meditation side effects and benefits.  Encouraged to keep appointment with a provider.  Patient does not have any rash, itching, tremors or shakes.  Recommended to call us back if he has any question, concern or worsening of the symptom.  Follow-up in 6 months.  Time spent 25 minutes.  Kamri Gotsch T., MD 07/17/2017, 10:29 AM

## 2017-07-18 ENCOUNTER — Emergency Department (HOSPITAL_COMMUNITY)
Admission: EM | Admit: 2017-07-18 | Discharge: 2017-07-19 | Disposition: A | Discharge status: Home / Self Care | Payer: Self-pay | Attending: Emergency Medicine | Admitting: Emergency Medicine

## 2017-07-18 ENCOUNTER — Emergency Department (HOSPITAL_COMMUNITY): Payer: Self-pay

## 2017-07-18 DIAGNOSIS — Y92129 Unspecified place in nursing home as the place of occurrence of the external cause: Secondary | ICD-10-CM | POA: Insufficient documentation

## 2017-07-18 DIAGNOSIS — I10 Essential (primary) hypertension: Secondary | ICD-10-CM | POA: Insufficient documentation

## 2017-07-18 DIAGNOSIS — E119 Type 2 diabetes mellitus without complications: Secondary | ICD-10-CM | POA: Insufficient documentation

## 2017-07-18 DIAGNOSIS — Z7982 Long term (current) use of aspirin: Secondary | ICD-10-CM | POA: Insufficient documentation

## 2017-07-18 DIAGNOSIS — Z7902 Long term (current) use of antithrombotics/antiplatelets: Secondary | ICD-10-CM | POA: Insufficient documentation

## 2017-07-18 DIAGNOSIS — Y999 Unspecified external cause status: Secondary | ICD-10-CM | POA: Insufficient documentation

## 2017-07-18 DIAGNOSIS — S0990XA Unspecified injury of head, initial encounter: Secondary | ICD-10-CM | POA: Insufficient documentation

## 2017-07-18 DIAGNOSIS — Y939 Activity, unspecified: Secondary | ICD-10-CM | POA: Insufficient documentation

## 2017-07-18 DIAGNOSIS — F0390 Unspecified dementia without behavioral disturbance: Secondary | ICD-10-CM | POA: Insufficient documentation

## 2017-07-18 DIAGNOSIS — W050XXA Fall from non-moving wheelchair, initial encounter: Secondary | ICD-10-CM | POA: Insufficient documentation

## 2017-07-18 DIAGNOSIS — Z79899 Other long term (current) drug therapy: Secondary | ICD-10-CM | POA: Insufficient documentation

## 2017-07-18 NOTE — ED Notes (Signed)
[facility] called for transport.  

## 2017-07-18 NOTE — ED Triage Notes (Addendum)
Pt from [facility] via [facility]. According to staff, pt fell out of wheelchair face foreward. Lac to forehead & skin tears to bilateral elbows. Bleeding controlled. Pt denies any LOC & pain @ this time. A&O x4 on arrival. Pt on Plavix.

## 2017-07-18 NOTE — ED Provider Notes (Signed)
[facility] DEPT Provider Note   CSN: [id number] Arrival date & time: 07/18/17  2111     History   Chief Complaint Chief Complaint  Patient presents with  . Fall    HPI [name]. is a 82 y.o. male.  HPI Patient is an 82 year old male who presents the emergency department after falling face forward [place] of [facility] [place] [facility].  He presents with a skin tear to his right temporal forehead.  He presents with bilateral skin tears to his elbows.  He denies pain in his neck.  Denies pain in his arms or legs.  He is on aspirin and Plavix.  No jaw or dental pain.  No other complaints.  He states his been feeling normal over the past several days.   Past Medical History:  Diagnosis Date  . Anxiety   . Arthritis   . Benign prostate hyperplasia   . Bipolar disorder ([place])   . C2 cervical fracture ([name])    due to pt fall  . Chronic indwelling Foley catheter   . Depression   . Diabetes mellitus type II   . Falls   . Flu   . Hearing aid worn   . Hypertension   . Memory difficulty 08/21/2014  . Memory loss   . MRSA infection (methicillin-resistant Staphylococcus aureus)   . Neuromuscular disorder ([place])    parkinsons  . Parkinson disease ([place])   . Pneumonia   . SIRS (systemic inflammatory response syndrome) ([name])   . UTI (urinary tract infection)     Patient Active Problem List   Diagnosis Date Noted  . CAP (community acquired pneumonia) 11/29/2016  . Bacteremia 03/14/2016  . HCAP (healthcare-associated pneumonia)   . Pressure ulcer 10/01/2015  . Right lower lobe pneumonia ([place]) 10/01/2015  . Sepsis due to Gram-negative organism with acute respiratory failure ([place]) 10/01/2015  . Acute renal failure ([place]) 10/01/2015  . Acute respiratory failure with hypoxia ([place]) 09/30/2015  . Influenza A (H1N1) 03/25/2015  . Dementia without behavioral disturbance 03/25/2015  . Hyperkalemia 03/25/2015  . Fall 03/16/2015  . Sacral pressure ulcer 03/16/2015    . Fever 03/16/2015  . Memory difficulty 08/21/2014  . Malnutrition of moderate degree ([place]) 04/19/2014  . UTI (lower urinary tract infection) 04/18/2014  . Hypoxia 04/18/2014  . PNA (pneumonia) 04/18/2014  . Chronic diastolic congestive heart failure ([place]) 02/01/2013  . Pericardial effusion 02/01/2013  . Bradycardia 01/31/2013  . Empyema lung ([place]) 12/23/2012  . S/P thoracotomy 12/23/2012  . Abnormality of gait 12/05/2012  . Hypernatremia 08/13/2012  . Hypertension   . Diabetes mellitus, type 2 ([place])   . Arthritis   . Bipolar disorder ([place])   . Anxiety   . Chronic indwelling Foley catheter   . Neuromuscular disorder ([place])   . Bacteremia of undetermined etiology 06/17/2012  . Parkinson disease ([place]) 04/16/2012  . Elevated troponin 04/16/2012  . Hearing aid worn   . Diabetes mellitus ([place]) 07/06/2011    Past Surgical History:  Procedure Laterality Date  . TOTAL HIP ARTHROPLASTY    . VIDEO ASSISTED THORACOSCOPY (VATS)/EMPYEMA  12/16/2012   Procedure: VIDEO ASSISTED THORACOSCOPY (VATS)/EMPYEMA;  Surgeon: Melrose Nakayama, MD;  Location: [facility];  Service: Thoracic;  Laterality: Right;  Marland Kitchen VIDEO BRONCHOSCOPY  12/16/2012   Procedure: VIDEO BRONCHOSCOPY;  Surgeon: Melrose Nakayama, MD;  Location: [facility];  Service: Thoracic;  Laterality: Right;       Home Medications    Prior to Admission medications  Medication Sig Start Date End Date Taking? Authorizing Provider  aspirin 81 MG chewable tablet Chew 81 mg by mouth daily.    [provider]  belladonna-opium (B&O SUPPRETTES) 16.2-30 MG suppository Place 1 suppository rectally every 8 (eight) hours as needed for pain. 0/86/57   Delora Fuel, MD  bisacodyl (DULCOLAX) 10 MG suppository Place 10 mg rectally as needed for moderate constipation.    [provider]  carbidopa-levodopa (SINEMET IR) 25-250 MG per tablet Take 1 tablet by mouth 3 (three) times daily.    [provider]  ciprofloxacin  (CIPRO) 500 MG tablet Take 1 tablet (500 mg total) by mouth 2 (two) times daily. Patient not taking: Reported on 06/13/2017 12/12/16   Ezequiel Essex, MD  clonazePAM (KLONOPIN) 0.5 MG tablet Take 0.5 tablets (0.25 mg total) by mouth daily as needed for anxiety. 07/17/17   Arfeen, Arlyce Harman, MD  clopidogrel (PLAVIX) 75 MG tablet Take 1 tablet (75 mg total) by mouth daily. Patient taking differently: Take 75 mg by mouth every evening.  10/07/15   Florencia Reasons, MD  entacapone (COMTAN) 200 MG tablet Take 200 mg by mouth 3 (three) times daily.    [provider]  feeding supplement, GLUCERNA SHAKE, (GLUCERNA SHAKE) LIQD Take 237 mLs by mouth 2 (two) times daily between meals. Patient not taking: Reported on 06/13/2017 03/16/16   Kelvin Cellar, MD  ferrous sulfate 325 (65 FE) MG tablet Take 325 mg by mouth 2 (two) times daily.    [provider]  food thickener (THICK IT) POWD Take 0.12 g by mouth as needed.    [provider]  GLUCERNA (GLUCERNA) LIQD Take 237 mLs by mouth 2 (two) times daily between meals.    [provider]  guaifenesin (ROBITUSSIN) 100 MG/5ML syrup Take 200 mg by mouth 3 (three) times daily as needed for cough.    [provider]  hydrALAZINE (APRESOLINE) 25 MG tablet Take 25 mg by mouth 2 (two) times daily.     [provider]  isosorbide mononitrate (IMDUR) 30 MG 24 hr tablet Take 1 tablet (30 mg total) by mouth daily. 10/07/15   Florencia Reasons, MD  ketoconazole (NIZORAL) 2 % cream Apply 1 application topically daily as needed for irritation.    [provider]  lamoTRIgine (LAMICTAL) 150 MG tablet Take 1 tablet (150 mg total) by mouth 2 (two) times daily. 07/17/17   Arfeen, Arlyce Harman, MD  lisinopril (PRINIVIL,ZESTRIL) 10 MG tablet Take 0.5 tablets (5 mg total) by mouth daily. Patient not taking: Reported on 06/13/2017 10/07/15   Florencia Reasons, MD  loratadine (CLARITIN) 10 MG tablet Take 10 mg by mouth daily as needed for allergies.     [provider]  Maltodextrin-Xanthan Gum (RESOURCE THICKENUP CLEAR) POWD Take 120 g by mouth as needed. Patient not taking: Reported on 06/13/2017 10/07/15   Florencia Reasons, MD  Multiple Vitamin (MULTIVITAMIN WITH MINERALS) TABS tablet Take 1 tablet by mouth daily.    [provider]  pantoprazole (PROTONIX) 40 MG tablet Take 40 mg by mouth daily. Reported on 07/06/2016    [provider]  polyethylene glycol (MIRALAX / GLYCOLAX) packet Take 17 g by mouth daily.    [provider]  predniSONE (DELTASONE) 2.5 MG tablet Take 2.5 mg by mouth daily with breakfast.    [provider]  QUEtiapine (SEROQUEL) 25 MG tablet Take 3 tablets (75 mg total) by mouth at bedtime. 07/17/17   Arfeen, Arlyce Harman, MD  rosuvastatin (CRESTOR) 20  MG tablet Take 20 mg by mouth daily.    [provider]  sertraline (ZOLOFT) 100 MG tablet Take 2 tablets (200 mg total) by mouth daily. 07/17/17   Arfeen, Arlyce Harman, MD  Skin Protectants, Misc. (BAZA PROTECT EX) Apply 1 application topically 3 (three) times daily as needed (for dermatitis). Apply to sacral (to dry skin)    [provider]  Skin Protectants, Misc. (BAZA PROTECT EX) Apply topically 2 (two) times daily.    [provider]  traZODone (DESYREL) 50 MG tablet Take 1 tablet (50 mg total) by mouth at bedtime. 01/17/17   Arfeen, Arlyce Harman, MD    Family History Family History  Problem Relation Age of Onset  . Depression Father   . Cancer Mother   . Heart disease Mother     Social History Social History  Substance Use Topics  . Smoking status: Never Smoker  . Smokeless tobacco: Never Used  . Alcohol use No     Comment: occasional     Allergies   Plant sterols and stanols   Review of Systems Review of Systems  All other systems reviewed and are negative.    Physical Exam Updated Vital Signs BP 120/62   Pulse (!) 51   Temp 97.6 F (36.4 C) (Oral)   Ht 5\' 10"  (1.778 m)   Wt 76.2 kg (168 lb)    SpO2 98%   BMI 24.11 kg/m   Physical Exam  Constitutional: He is oriented to person, place, and time. He appears well-developed and well-nourished.  HENT:  Head: Normocephalic.  Small swelling of the right temporal right forehead region with a small skin tear in this region.  No laceration.  No active bleeding.  Eyes: EOM are normal.  Neck: Normal range of motion.  C-spine nontender  Cardiovascular: Normal rate, regular rhythm and normal heart sounds.   Pulmonary/Chest: Effort normal and breath sounds normal. No respiratory distress.  Abdominal: Soft. He exhibits no distension. There is no tenderness.  Musculoskeletal: Normal range of motion.  Full range of motion of bilateral shoulders, elbows and wrists. Full range of motion of bilateral hips, knees and ankles.    Neurological: He is alert and oriented to person, place, and time.  Skin: Skin is warm and dry.  Psychiatric: He has a normal mood and affect. Judgment normal.  Nursing note and vitals reviewed.    ED Treatments / Results  Labs (all labs ordered are listed, but only abnormal results are displayed) Labs Reviewed - No data to display  EKG  EKG Interpretation None       Radiology Ct Head Wo Contrast  Result Date: 07/18/2017 CLINICAL DATA:  Forehead laceration after falling out of a wheelchair face board. On Plavix. EXAM: CT HEAD WITHOUT CONTRAST TECHNIQUE: Contiguous axial images were obtained from the base of the skull through the vertex without intravenous contrast. COMPARISON:  06/14/2017. FINDINGS: Brain: Diffusely enlarged ventricles and subarachnoid spaces. Patchy white matter low density in both cerebral hemispheres. Stable prominent CSF space posterior to the right cerebral hemisphere and bulging superiorly. No intracranial hemorrhage, mass lesion or CT evidence of acute infarction. Vascular: No hyperdense vessel or unexpected calcification. Skull: Normal. Negative for fracture or focal lesion. Sinuses/Orbits:  Unremarkable. Other: Right frontal scalp soft tissue irregularity and increased density. IMPRESSION: 1. Right frontal scalp laceration without skull fracture or intracranial hemorrhage. 2. Stable mild to moderate diffuse cerebral and cerebellar atrophy. 3. Stable mild chronic small vessel white matter ischemic changes in both cerebral  hemispheres. 4. Stable right posterior fossa arachnoid cyst. Electronically Signed   By: Claudie Revering M.D.   On: 07/18/2017 22:17    Procedures Procedures (including critical care time)  Medications Ordered in ED Medications - No data to display   Initial Impression / Assessment and Plan / ED Course  I have reviewed the triage vital signs and the nursing notes.  Pertinent labs & imaging results that were available during my care of the patient were reviewed by me and considered in my medical decision making (see chart for details).     CT head negative.  Skin tear be treated with antibacterial ointment.  Primary care follow-up.  Final Clinical Impressions(s) / ED Diagnoses   Final diagnoses:  Closed head injury, initial encounter    New Prescriptions New Prescriptions   No medications on file     Jola Schmidt, MD 07/18/17 2224

## 2017-07-21 ENCOUNTER — Emergency Department (HOSPITAL_COMMUNITY)
Admission: EM | Admit: 2017-07-21 | Discharge: 2017-07-21 | Disposition: A | Discharge status: Home / Self Care | Payer: Medicare Other | Attending: Emergency Medicine | Admitting: Emergency Medicine

## 2017-07-21 ENCOUNTER — Encounter (HOSPITAL_COMMUNITY): Payer: Self-pay | Admitting: *Deleted

## 2017-07-21 ENCOUNTER — Emergency Department (HOSPITAL_COMMUNITY): Payer: Medicare Other

## 2017-07-21 DIAGNOSIS — Y92129 Unspecified place in nursing home as the place of occurrence of the external cause: Secondary | ICD-10-CM | POA: Insufficient documentation

## 2017-07-21 DIAGNOSIS — W050XXA Fall from non-moving wheelchair, initial encounter: Secondary | ICD-10-CM | POA: Insufficient documentation

## 2017-07-21 DIAGNOSIS — E119 Type 2 diabetes mellitus without complications: Secondary | ICD-10-CM | POA: Insufficient documentation

## 2017-07-21 DIAGNOSIS — I110 Hypertensive heart disease with heart failure: Secondary | ICD-10-CM | POA: Insufficient documentation

## 2017-07-21 DIAGNOSIS — W19XXXA Unspecified fall, initial encounter: Secondary | ICD-10-CM

## 2017-07-21 DIAGNOSIS — F0390 Unspecified dementia without behavioral disturbance: Secondary | ICD-10-CM | POA: Insufficient documentation

## 2017-07-21 DIAGNOSIS — Y999 Unspecified external cause status: Secondary | ICD-10-CM | POA: Insufficient documentation

## 2017-07-21 DIAGNOSIS — I10 Essential (primary) hypertension: Secondary | ICD-10-CM | POA: Insufficient documentation

## 2017-07-21 DIAGNOSIS — S0091XA Abrasion of unspecified part of head, initial encounter: Secondary | ICD-10-CM

## 2017-07-21 DIAGNOSIS — Z7982 Long term (current) use of aspirin: Secondary | ICD-10-CM | POA: Insufficient documentation

## 2017-07-21 DIAGNOSIS — I5032 Chronic diastolic (congestive) heart failure: Secondary | ICD-10-CM | POA: Insufficient documentation

## 2017-07-21 DIAGNOSIS — S0081XA Abrasion of other part of head, initial encounter: Secondary | ICD-10-CM | POA: Insufficient documentation

## 2017-07-21 DIAGNOSIS — Z7902 Long term (current) use of antithrombotics/antiplatelets: Secondary | ICD-10-CM | POA: Insufficient documentation

## 2017-07-21 DIAGNOSIS — Y939 Activity, unspecified: Secondary | ICD-10-CM | POA: Insufficient documentation

## 2017-07-21 DIAGNOSIS — Z79899 Other long term (current) drug therapy: Secondary | ICD-10-CM | POA: Insufficient documentation

## 2017-07-21 LAB — I-STAT CHEM 8, ED
BUN: 34 mg/dL — AB (ref 6–20)
CALCIUM ION: 1.11 mmol/L — AB (ref 1.15–1.40)
CHLORIDE: 105 mmol/L (ref 101–111)
Creatinine, Ser: 1.1 mg/dL (ref 0.61–1.24)
Glucose, Bld: 86 mg/dL (ref 65–99)
HEMATOCRIT: 37 % — AB (ref 39.0–52.0)
Hemoglobin: 12.6 g/dL — ABNORMAL LOW (ref 13.0–17.0)
POTASSIUM: 4.4 mmol/L (ref 3.5–5.1)
SODIUM: 137 mmol/L (ref 135–145)
TCO2: 26 mmol/L (ref 0–100)

## 2017-07-21 NOTE — ED Triage Notes (Signed)
Pt in from [facility] via [facility], per report pt fell today & pulled call bell, unwitnessed fall, per report pt did not have LOC, pt at neuro baseline, pt hx of dementia, pt alert to person & place, pt MAE, pt takes Plavix, pt has 4 cm lac to L forehead, pt has towel roll & roll guaze in place upon arrival to ED, pt follows commands

## 2017-07-21 NOTE — ED Provider Notes (Signed)
[facility] DEPT Provider Note   CSN: [id number] Arrival date & time: 07/21/17  0815     History   Chief Complaint Chief Complaint  Patient presents with  . Fall    HPI [name]. is a 82 y.o. male.  HPI  82 year old male presents today via EMS from [facility]. He fell out of a wheelchair. He states he has a history of Parkinson's and frequent falls. He feels that he lost his balance. He states this occurred around 5 AM this morning. He is not complaining of pain. He had a wound to his head which has a bandage in place. He denies any other pain or injuries. He was seen here 3 days ago for fall and suffered multiple contusions at that time. He denies any other change in his normal state. He states he has been eating and drinking without difficulty. He denies fever, chills, chest pain, nausea, vomiting, or diarrhea. He denies headache or lateralized weakness.  Past Medical History:  Diagnosis Date  . Anxiety   . Arthritis   . Benign prostate hyperplasia   . Bipolar disorder ([place])   . C2 cervical fracture ([name])    due to pt fall  . Chronic indwelling Foley catheter   . Depression   . Diabetes mellitus type II   . Falls   . Flu   . Hearing aid worn   . Hypertension   . Memory difficulty 08/21/2014  . Memory loss   . MRSA infection (methicillin-resistant Staphylococcus aureus)   . Neuromuscular disorder ([place])    parkinsons  . Parkinson disease ([place])   . Pneumonia   . SIRS (systemic inflammatory response syndrome) ([name])   . UTI (urinary tract infection)     Patient Active Problem List   Diagnosis Date Noted  . CAP (community acquired pneumonia) 11/29/2016  . Bacteremia 03/14/2016  . HCAP (healthcare-associated pneumonia)   . Pressure ulcer 10/01/2015  . Right lower lobe pneumonia ([place]) 10/01/2015  . Sepsis due to Gram-negative organism with acute respiratory failure ([place]) 10/01/2015  . Acute renal failure ([place]) 10/01/2015  . Acute respiratory failure  with hypoxia ([place]) 09/30/2015  . Influenza A (H1N1) 03/25/2015  . Dementia without behavioral disturbance 03/25/2015  . Hyperkalemia 03/25/2015  . Fall 03/16/2015  . Sacral pressure ulcer 03/16/2015  . Fever 03/16/2015  . Memory difficulty 08/21/2014  . Malnutrition of moderate degree ([place]) 04/19/2014  . UTI (lower urinary tract infection) 04/18/2014  . Hypoxia 04/18/2014  . PNA (pneumonia) 04/18/2014  . Chronic diastolic congestive heart failure ([place]) 02/01/2013  . Pericardial effusion 02/01/2013  . Bradycardia 01/31/2013  . Empyema lung ([place]) 12/23/2012  . S/P thoracotomy 12/23/2012  . Abnormality of gait 12/05/2012  . Hypernatremia 08/13/2012  . Hypertension   . Diabetes mellitus, type 2 ([place])   . Arthritis   . Bipolar disorder ([place])   . Anxiety   . Chronic indwelling Foley catheter   . Neuromuscular disorder ([place])   . Bacteremia of undetermined etiology 06/17/2012  . Parkinson disease ([place]) 04/16/2012  . Elevated troponin 04/16/2012  . Hearing aid worn   . Diabetes mellitus ([place]) 07/06/2011    Past Surgical History:  Procedure Laterality Date  . TOTAL HIP ARTHROPLASTY    . VIDEO ASSISTED THORACOSCOPY (VATS)/EMPYEMA  12/16/2012   Procedure: VIDEO ASSISTED THORACOSCOPY (VATS)/EMPYEMA;  Surgeon: Melrose Nakayama, MD;  Location: [facility];  Service: Thoracic;  Laterality: Right;  Marland Kitchen VIDEO BRONCHOSCOPY  12/16/2012   Procedure: VIDEO BRONCHOSCOPY;  Surgeon: Remo Lipps  Chaya Jan, MD;  Location: [facility];  Service: Thoracic;  Laterality: Right;       Home Medications    Prior to Admission medications   Medication Sig Start Date End Date Taking? Authorizing Provider  aspirin 81 MG chewable tablet Chew 81 mg by mouth daily.    [provider]  belladonna-opium (B&O SUPPRETTES) 16.2-30 MG suppository Place 1 suppository rectally every 8 (eight) hours as needed for pain. 2/95/18   Delora Fuel, MD  bisacodyl (DULCOLAX) 10 MG suppository Place 10 mg rectally as needed  for moderate constipation.    [provider]  carbidopa-levodopa (SINEMET IR) 25-250 MG per tablet Take 1 tablet by mouth 3 (three) times daily.    [provider]  ciprofloxacin (CIPRO) 500 MG tablet Take 1 tablet (500 mg total) by mouth 2 (two) times daily. Patient not taking: Reported on 06/13/2017 12/12/16   Ezequiel Essex, MD  clonazePAM (KLONOPIN) 0.5 MG tablet Take 0.5 tablets (0.25 mg total) by mouth daily as needed for anxiety. 07/17/17   Arfeen, Arlyce Harman, MD  clopidogrel (PLAVIX) 75 MG tablet Take 1 tablet (75 mg total) by mouth daily. Patient taking differently: Take 75 mg by mouth every evening.  10/07/15   Florencia Reasons, MD  entacapone (COMTAN) 200 MG tablet Take 200 mg by mouth 3 (three) times daily.    [provider]  feeding supplement, GLUCERNA SHAKE, (GLUCERNA SHAKE) LIQD Take 237 mLs by mouth 2 (two) times daily between meals. Patient not taking: Reported on 06/13/2017 03/16/16   Kelvin Cellar, MD  ferrous sulfate 325 (65 FE) MG tablet Take 325 mg by mouth 2 (two) times daily.    [provider]  food thickener (THICK IT) POWD Take 0.12 g by mouth as needed.    [provider]  GLUCERNA (GLUCERNA) LIQD Take 237 mLs by mouth 2 (two) times daily between meals.    [provider]  guaifenesin (ROBITUSSIN) 100 MG/5ML syrup Take 200 mg by mouth 3 (three) times daily as needed for cough.    [provider]  hydrALAZINE (APRESOLINE) 25 MG tablet Take 25 mg by mouth 2 (two) times daily.     [provider]  isosorbide mononitrate (IMDUR) 30 MG 24 hr tablet Take 1 tablet (30 mg total) by mouth daily. 10/07/15   Florencia Reasons, MD  ketoconazole (NIZORAL) 2 % cream Apply 1 application topically daily as needed for irritation.    [provider]  lamoTRIgine (LAMICTAL) 150 MG tablet Take 1 tablet (150 mg total) by mouth 2 (two) times daily. 07/17/17   Arfeen, Arlyce Harman, MD  lisinopril (PRINIVIL,ZESTRIL) 10 MG tablet Take 0.5  tablets (5 mg total) by mouth daily. Patient not taking: Reported on 06/13/2017 10/07/15   Florencia Reasons, MD  loratadine (CLARITIN) 10 MG tablet Take 10 mg by mouth daily as needed for allergies.    [provider]  Maltodextrin-Xanthan Gum (RESOURCE THICKENUP CLEAR) POWD Take 120 g by mouth as needed. Patient not taking: Reported on 06/13/2017 10/07/15   Florencia Reasons, MD  Multiple Vitamin (MULTIVITAMIN WITH MINERALS) TABS tablet Take 1 tablet by mouth daily.    [provider]  pantoprazole (PROTONIX) 40 MG tablet Take 40 mg by mouth daily. Reported on 07/06/2016    [provider]  polyethylene glycol (MIRALAX / GLYCOLAX) packet Take 17 g by mouth daily.    [provider]  predniSONE (DELTASONE) 2.5 MG tablet Take 2.5 mg by mouth daily with breakfast.  [provider]  QUEtiapine (SEROQUEL) 25 MG tablet Take 3 tablets (75 mg total) by mouth at bedtime. 07/17/17   Arfeen, Arlyce Harman, MD  rosuvastatin (CRESTOR) 20 MG tablet Take 20 mg by mouth daily.    [provider]  sertraline (ZOLOFT) 100 MG tablet Take 2 tablets (200 mg total) by mouth daily. 07/17/17   Arfeen, Arlyce Harman, MD  Skin Protectants, Misc. (BAZA PROTECT EX) Apply 1 application topically 3 (three) times daily as needed (for dermatitis). Apply to sacral (to dry skin)    [provider]  Skin Protectants, Misc. (BAZA PROTECT EX) Apply topically 2 (two) times daily.    [provider]  traZODone (DESYREL) 50 MG tablet Take 1 tablet (50 mg total) by mouth at bedtime. 01/17/17   Arfeen, Arlyce Harman, MD    Family History Family History  Problem Relation Age of Onset  . Depression Father   . Cancer Mother   . Heart disease Mother     Social History Social History  Substance Use Topics  . Smoking status: Never Smoker  . Smokeless tobacco: Never Used  . Alcohol use No     Comment: occasional     Allergies   Plant sterols and stanols   Review of Systems Review of Systems    All other systems reviewed and are negative.    Physical Exam Updated Vital Signs BP (!) 168/71 (BP Location: Left Arm)   Pulse 61   Temp 97.6 F (36.4 C) (Oral)   Resp 14   SpO2 99%   Physical Exam  Constitutional: He appears well-developed and well-nourished.  HENT:  Head: Normocephalic.    Healing laceration with dressing right for head You skin defect left forehead approximately 2 cm diameter with bleeding controlled  Eyes: Pupils are equal, round, and reactive to light. EOM are normal.  Neck: Normal range of motion. Neck supple.  Cardiovascular: Normal rate and regular rhythm.   Pulmonary/Chest: Effort normal and breath sounds normal.  Abdominal: Soft. Bowel sounds are normal.  Genitourinary:  Genitourinary Comments: Catheter in place  Musculoskeletal: Normal range of motion.  Multiple old contusions and abrasions are noted with a dressing in place on the left knee no new contusions or abrasions are noted on the extremities or trunk. Bilateral hips and lower extremities are actively ranged without any pain or complaints. Bilateral upper extremities are ranged without pain or tenderness of her shoulders, elbows, wrists, or hands.  Neurological: He is alert. He displays normal reflexes. No cranial nerve deficit.  Patient is oriented to place and year  Skin: Skin is warm and dry. Capillary refill takes less than 2 seconds.  Nursing note and vitals reviewed.    ED Treatments / Results  Labs (all labs ordered are listed, but only abnormal results are displayed) Labs Reviewed - No data to display  EKG  EKG Interpretation None       Radiology Ct Head Wo Contrast  Result Date: 07/21/2017 CLINICAL DATA:  Pt had an unwitnessed fall he says he had no LOC he has a laceration over Lt eye headache neck pain EXAM: CT HEAD WITHOUT CONTRAST CT CERVICAL SPINE WITHOUT CONTRAST TECHNIQUE: Multidetector CT imaging of the head and cervical spine was performed following the  standard protocol without intravenous contrast. Multiplanar CT image reconstructions of the cervical spine were also generated. COMPARISON:  CT 3 days prior (07/18/2017) as well as 06/06/2017 FINDINGS: CT HEAD FINDINGS Brain: No acute intracranial hemorrhage. No focal mass lesion. No CT  evidence of acute infarction. No midline shift or mass effect. Mild hydrocephalus. Basilar cisterns are patent. Prominent arachnoid cysts in the posterior fossa. Moderate ventricular dilatation is similar to comparison exams. The degree of hydrocephalus is disproportionate to atrophy. There are periventricular and subcortical white matter hypodensities. Generalized cortical atrophy. Vascular: No hyperdense vessel or unexpected calcification. Skull: Small LEFT frontal scalp hematoma Sinuses/Orbits: No acute finding. Other: None CT CERVICAL SPINE FINDINGS Alignment: Normal alignment of the cervical vertebral bodies. Skull base and vertebrae: Normal craniocervical junction. No loss of bowel vertebral body height or disc height. Normal facet articulation. No evidence of fracture. Soft tissues and spinal canal: No prevertebral soft tissue swelling. No perispinal or epidural hematoma. Disc levels: Multiple levels of endplate spurring and disc space narrowing in the lower cervical spine. Upper chest: Clear Other: None IMPRESSION: 1. No acute intracranial findings. Small LEFT frontal scalp hematoma. 2. Ventriculomegaly disproportionate to cortical atrophy. No change from prior. In patient with multiple falls, recommend clinical correlation for normal pressure hydrocephalus. 3. No cervical spine fracture. Electronically Signed   By: Suzy Bouchard M.D.   On: 07/21/2017 10:11   Ct Cervical Spine Wo Contrast  Result Date: 07/21/2017 CLINICAL DATA:  Pt had an unwitnessed fall he says he had no LOC he has a laceration over Lt eye headache neck pain EXAM: CT HEAD WITHOUT CONTRAST CT CERVICAL SPINE WITHOUT CONTRAST TECHNIQUE: Multidetector CT  imaging of the head and cervical spine was performed following the standard protocol without intravenous contrast. Multiplanar CT image reconstructions of the cervical spine were also generated. COMPARISON:  CT 3 days prior (07/18/2017) as well as 06/06/2017 FINDINGS: CT HEAD FINDINGS Brain: No acute intracranial hemorrhage. No focal mass lesion. No CT evidence of acute infarction. No midline shift or mass effect. Mild hydrocephalus. Basilar cisterns are patent. Prominent arachnoid cysts in the posterior fossa. Moderate ventricular dilatation is similar to comparison exams. The degree of hydrocephalus is disproportionate to atrophy. There are periventricular and subcortical white matter hypodensities. Generalized cortical atrophy. Vascular: No hyperdense vessel or unexpected calcification. Skull: Small LEFT frontal scalp hematoma Sinuses/Orbits: No acute finding. Other: None CT CERVICAL SPINE FINDINGS Alignment: Normal alignment of the cervical vertebral bodies. Skull base and vertebrae: Normal craniocervical junction. No loss of bowel vertebral body height or disc height. Normal facet articulation. No evidence of fracture. Soft tissues and spinal canal: No prevertebral soft tissue swelling. No perispinal or epidural hematoma. Disc levels: Multiple levels of endplate spurring and disc space narrowing in the lower cervical spine. Upper chest: Clear Other: None IMPRESSION: 1. No acute intracranial findings. Small LEFT frontal scalp hematoma. 2. Ventriculomegaly disproportionate to cortical atrophy. No change from prior. In patient with multiple falls, recommend clinical correlation for normal pressure hydrocephalus. 3. No cervical spine fracture. Electronically Signed   By: Suzy Bouchard M.D.   On: 07/21/2017 10:11    Procedures Procedures (including critical care time)  Medications Ordered in ED Medications - No data to display   Initial Impression / Assessment and Plan / ED Course  I have reviewed the  triage vital signs and the nursing notes.  Pertinent labs & imaging results that were available during my care of the patient were reviewed by me and considered in my medical decision making (see chart for details).     Wound on left forehead cleansed. Plan any remaining head to ensure there are no other lacerations. CT of head and neck are pending. Patient with frequent falls. CT with ventriculomegaly out of proportion to  atrophy and raising question of normal pressure hydrocephalus. Chart reviewed and reveals he had an MRI in January and at that time he had moderate ventriculomegaly noted. Patient was seen by Dr. Jannifer Franklin. Would advise follow-up with neurologist to assess for any ongoing changes.  Final Clinical Impressions(s) / ED Diagnoses   Final diagnoses:  Fall, initial encounter  Abrasion of head, initial encounter    New Prescriptions New Prescriptions   No medications on file     Pattricia Boss, MD 07/21/17 1018

## 2017-07-21 NOTE — ED Notes (Signed)
Patient transported to CT 

## 2017-07-21 NOTE — ED Notes (Addendum)
Pt stated wanted hands, arms, neck and ear cleaned of blood. This NT cleaned pt up before being discharged. Pt asked for hair to be cleaned of blood as well, notified pt I can do the best I can and that pt will need a bath to get blood fully out of pt's hair. Pt understood and satisfied. Pt dressed and awaiting PTAR.

## 2017-07-21 NOTE — Discharge Instructions (Signed)
Please have primary doctor assess for need for recheck with his neurologist.  Patient with ventriculomegaly but had mri and assessment by neurology recently.

## 2017-07-23 ENCOUNTER — Emergency Department (HOSPITAL_COMMUNITY)
Admission: EM | Admit: 2017-07-23 | Discharge: 2017-07-23 | Disposition: A | Discharge status: Home / Self Care | Payer: Medicare Other | Attending: Emergency Medicine | Admitting: Emergency Medicine

## 2017-07-23 ENCOUNTER — Encounter (HOSPITAL_COMMUNITY): Payer: Self-pay | Admitting: Obstetrics and Gynecology

## 2017-07-23 DIAGNOSIS — G20 Parkinson's disease: Secondary | ICD-10-CM | POA: Insufficient documentation

## 2017-07-23 DIAGNOSIS — Z7982 Long term (current) use of aspirin: Secondary | ICD-10-CM | POA: Insufficient documentation

## 2017-07-23 DIAGNOSIS — I10 Essential (primary) hypertension: Secondary | ICD-10-CM | POA: Insufficient documentation

## 2017-07-23 DIAGNOSIS — N3000 Acute cystitis without hematuria: Secondary | ICD-10-CM | POA: Insufficient documentation

## 2017-07-23 DIAGNOSIS — Z96649 Presence of unspecified artificial hip joint: Secondary | ICD-10-CM | POA: Insufficient documentation

## 2017-07-23 DIAGNOSIS — Z043 Encounter for examination and observation following other accident: Secondary | ICD-10-CM | POA: Insufficient documentation

## 2017-07-23 DIAGNOSIS — Z79899 Other long term (current) drug therapy: Secondary | ICD-10-CM | POA: Insufficient documentation

## 2017-07-23 DIAGNOSIS — W19XXXA Unspecified fall, initial encounter: Secondary | ICD-10-CM

## 2017-07-23 LAB — URINALYSIS, ROUTINE W REFLEX MICROSCOPIC
Bilirubin Urine: NEGATIVE
GLUCOSE, UA: NEGATIVE mg/dL
Hgb urine dipstick: NEGATIVE
KETONES UR: 5 mg/dL — AB
Nitrite: NEGATIVE
PH: 6 (ref 5.0–8.0)
PROTEIN: NEGATIVE mg/dL
Specific Gravity, Urine: 1.016 (ref 1.005–1.030)
Squamous Epithelial / HPF: NONE SEEN

## 2017-07-23 LAB — I-STAT CHEM 8, ED
BUN: 36 mg/dL — ABNORMAL HIGH (ref 6–20)
CALCIUM ION: 1.15 mmol/L (ref 1.15–1.40)
CREATININE: 1.2 mg/dL (ref 0.61–1.24)
Chloride: 105 mmol/L (ref 101–111)
GLUCOSE: 83 mg/dL (ref 65–99)
HCT: 33 % — ABNORMAL LOW (ref 39.0–52.0)
HEMOGLOBIN: 11.2 g/dL — AB (ref 13.0–17.0)
Potassium: 4.6 mmol/L (ref 3.5–5.1)
Sodium: 140 mmol/L (ref 135–145)
TCO2: 24 mmol/L (ref 0–100)

## 2017-07-23 MED ORDER — PHENAZOPYRIDINE HCL 200 MG PO TABS: 200.0000 mg | ORAL_TABLET | Freq: Three times a day (TID) | ORAL | 0 refills | Status: DC

## 2017-07-23 MED ORDER — CEPHALEXIN 500 MG PO CAPS
500.0000 mg | ORAL_CAPSULE | Freq: Once | ORAL | Status: AC
  Administered 2017-07-23: 500 mg via ORAL
  Filled 2017-07-23: qty 1

## 2017-07-23 MED ORDER — CEPHALEXIN 500 MG PO CAPS: 500.0000 mg | ORAL_CAPSULE | Freq: Two times a day (BID) | ORAL | 0 refills | Status: DC

## 2017-07-23 NOTE — ED Provider Notes (Signed)
Patient seen and evaluated. He has some increasing weakness. His had multiple falls. Has apparent UTI per labs. We'll culture and treat. Reassuring studies and imaging. Appropriate for discharge home.   Tanna Furry, MD 07/23/17 [phone]

## 2017-07-23 NOTE — ED Notes (Signed)
Pt has called out repeatedly and RN and NT have talked with Pt.  Pt is angry and wants to leave. Pt has been cursing at staff.

## 2017-07-23 NOTE — ED Notes (Signed)
Bed: [facility] Expected date:  Expected time:  Means of arrival:  Comments: 

## 2017-07-23 NOTE — ED Notes (Signed)
PTAR Called for transport 

## 2017-07-23 NOTE — Discharge Instructions (Signed)
You have been seen today for your complaint of pain with urination. Your lab work showed urine infection. Your discharge medications include: 1) Keflex (first dose was given in the emergency department) Please take all of your antibiotics until finished!   You may develop abdominal discomfort or diarrhea from the antibiotic.  You may help offset this with probiotics which you can buy or get in yogurt. Do not eat  or take the probiotics until 2 hours after your antibiotic.  2) Pyridium This medication will help relieve pain and burning but does not treat the infection.  Make sure that you wear a panty liner as it may stain your underwear. Home care instructions are as follows:  1) please drink plenty of water, avoid tea and beverages with caffeine like coffee or soda Follow up with: your PCP A urine culture has been sent to make sure your are on the correct antiboitic. You will receive a call if you antibotic needs to be changed.  Recommend that the patient be placed in a Posey/seatbelt while in wheelchair to prevent future falls.  Please seek immediate medical care if you develop any of the following symptoms: SEEK MEDICAL CARE IF:  You have back pain.  You develop a fever.  Your symptoms do not begin to resolve within 3 days.  SEEK IMMEDIATE MEDICAL CARE IF:  You have severe back pain or lower abdominal pain.  You develop chills.  You have nausea or vomiting.  You have continued burning or discomfort with urination.

## 2017-07-23 NOTE — ED Provider Notes (Signed)
[facility] DEPT Provider Note   CSN: [id number] Arrival date & time: 07/23/17  1232     History   Chief Complaint Chief Complaint  Patient presents with  . Fall    HPI [name]. is a 82 y.o. male with history of Bipolar, DM2, Parkinson's who presents emergency Department from [facility] for change in patient status. After speaking with nurse Levada Dy, the patient has fell 4 times the last 3 days. She states that is fallen twice today. She states that he is wheelchair-bound and is constantly leaning forward in his wheelchair. She states occasionally he will droop out of the wheelchair and fall onto the floor. He is not hit his head or loss consciousness. No nausea or vomiting. No complaints of pain. Last fall was 8/4 he was evaluated in the emergency department. Multiple wounds noted at this time on for at including 2 on the left and one on the right healing appropriately. The patient was evaluated with a CT head during last visit on 07/21/17 which showed ventriculomegaly out of proportion of atrophy. MRI in January had moderate ventriculomegaly noted. He was evaluated by Dr. Jannifer Franklin and was advised to follow-up with neurology to assess ongoing changes. He has not followed up at this time. The patient does have a indwelling catheter bag in place. Denies fevers, chills, myalgias, arthralgias, DOE, SOB, chest tightness or pressure or pain, diaphoresis, dysuria, abdominal pain, dysuria, hematuria, headaches, light headedness, weakness, visual disturbances, diarrhea, melena, hematochezia.   HPI  Past Medical History:  Diagnosis Date  . Anxiety   . Arthritis   . Benign prostate hyperplasia   . Bipolar disorder ([place])   . C2 cervical fracture ([name])    due to pt fall  . Chronic indwelling Foley catheter   . Depression   . Diabetes mellitus type II   . Falls   . Flu   . Hearing aid worn   . Hypertension   . Memory difficulty 08/21/2014  . Memory loss   .  MRSA infection (methicillin-resistant Staphylococcus aureus)   . Neuromuscular disorder ([place])    parkinsons  . Parkinson disease ([place])   . Pneumonia   . SIRS (systemic inflammatory response syndrome) ([name])   . UTI (urinary tract infection)     Patient Active Problem List   Diagnosis Date Noted  . CAP (community acquired pneumonia) 11/29/2016  . Bacteremia 03/14/2016  . HCAP (healthcare-associated pneumonia)   . Pressure ulcer 10/01/2015  . Right lower lobe pneumonia ([place]) 10/01/2015  . Sepsis due to Gram-negative organism with acute respiratory failure ([place]) 10/01/2015  . Acute renal failure ([place]) 10/01/2015  . Acute respiratory failure with hypoxia ([place]) 09/30/2015  . Influenza A (H1N1) 03/25/2015  . Dementia without behavioral disturbance 03/25/2015  . Hyperkalemia 03/25/2015  . Fall 03/16/2015  . Sacral pressure ulcer 03/16/2015  . Fever 03/16/2015  . Memory difficulty 08/21/2014  . Malnutrition of moderate degree ([place]) 04/19/2014  . UTI (lower urinary tract infection) 04/18/2014  . Hypoxia 04/18/2014  . PNA (pneumonia) 04/18/2014  . Chronic diastolic congestive heart failure ([place]) 02/01/2013  . Pericardial effusion 02/01/2013  . Bradycardia 01/31/2013  . Empyema lung ([place]) 12/23/2012  . S/P thoracotomy 12/23/2012  . Abnormality of gait 12/05/2012  . Hypernatremia 08/13/2012  . Hypertension   . Diabetes mellitus, type 2 ([place])   . Arthritis   . Bipolar disorder ([place])   . Anxiety   . Chronic indwelling Foley catheter   . Neuromuscular disorder ([place])   .  Bacteremia of undetermined etiology 06/17/2012  . Parkinson disease ([place]) 04/16/2012  . Elevated troponin 04/16/2012  . Hearing aid worn   . Diabetes mellitus ([place]) 07/06/2011    Past Surgical History:  Procedure Laterality Date  . TOTAL HIP ARTHROPLASTY    . VIDEO ASSISTED THORACOSCOPY (VATS)/EMPYEMA  12/16/2012   Procedure: VIDEO ASSISTED THORACOSCOPY (VATS)/EMPYEMA;  Surgeon: Melrose Nakayama, MD;   Location: [facility];  Service: Thoracic;  Laterality: Right;  Marland Kitchen VIDEO BRONCHOSCOPY  12/16/2012   Procedure: VIDEO BRONCHOSCOPY;  Surgeon: Melrose Nakayama, MD;  Location: [facility];  Service: Thoracic;  Laterality: Right;       Home Medications    Prior to Admission medications   Medication Sig Start Date End Date Taking? Authorizing Provider  acetaminophen (TYLENOL) 325 MG tablet Take 650 mg by mouth every 6 (six) hours as needed for mild pain.    [provider]  aspirin 81 MG chewable tablet Chew 81 mg by mouth daily.    [provider]  belladonna-opium (B&O SUPPRETTES) 16.2-30 MG suppository Place 1 suppository rectally every 8 (eight) hours as needed for pain. 3/61/44   Delora Fuel, MD  bisacodyl (DULCOLAX) 10 MG suppository Place 10 mg rectally as needed for moderate constipation.    [provider]  carbidopa-levodopa (SINEMET IR) 25-250 MG per tablet Take 1 tablet by mouth 3 (three) times daily.    [provider]  cephALEXin (KEFLEX) 500 MG capsule Take 1 capsule (500 mg total) by mouth 2 (two) times daily. 07/23/17   Maczis, Barth Kirks, PA-C  clonazePAM (KLONOPIN) 0.5 MG tablet Take 0.5 tablets (0.25 mg total) by mouth daily as needed for anxiety. 07/17/17   Arfeen, Arlyce Harman, MD  clonazePAM (KLONOPIN) 0.5 MG tablet Take 0.5 mg by mouth at bedtime.    [provider]  clopidogrel (PLAVIX) 75 MG tablet Take 1 tablet (75 mg total) by mouth daily. Patient taking differently: Take 75 mg by mouth every evening.  10/07/15   Florencia Reasons, MD  entacapone (COMTAN) 200 MG tablet Take 200 mg by mouth 3 (three) times daily.    [provider]  feeding supplement, GLUCERNA SHAKE, (GLUCERNA SHAKE) LIQD Take 237 mLs by mouth 2 (two) times daily between meals. 03/16/16   Kelvin Cellar, MD  ferrous sulfate 325 (65 FE) MG tablet Take 325 mg by mouth 2 (two) times daily.    [provider]  guaifenesin (ROBITUSSIN) 100 MG/5ML syrup Take 200 mg by  mouth every 4 (four) hours as needed for cough.     [provider]  hydrALAZINE (APRESOLINE) 25 MG tablet Take 25 mg by mouth 2 (two) times daily.     [provider]  isosorbide mononitrate (IMDUR) 30 MG 24 hr tablet Take 1 tablet (30 mg total) by mouth daily. 10/07/15   Florencia Reasons, MD  ketoconazole (NIZORAL) 2 % cream Apply 1 application topically daily as needed (fungal infection).     [provider]  lamoTRIgine (LAMICTAL) 150 MG tablet Take 1 tablet (150 mg total) by mouth 2 (two) times daily. 07/17/17   Arfeen, Arlyce Harman, MD  lisinopril (PRINIVIL,ZESTRIL) 10 MG tablet Take 0.5 tablets (5 mg total) by mouth daily. 10/07/15   Florencia Reasons, MD  loratadine (CLARITIN) 10 MG tablet Take 10 mg by mouth daily as needed for allergies.    [provider]  Maltodextrin-Xanthan Gum (RESOURCE THICKENUP CLEAR) POWD Take 120 g by mouth as needed. Patient taking differently: Take 1 Can by mouth as  needed. thickener 10/07/15   Florencia Reasons, MD  Multiple Vitamin (MULTIVITAMIN WITH MINERALS) TABS tablet Take 1 tablet by mouth daily.    [provider]  pantoprazole (PROTONIX) 40 MG tablet Take 40 mg by mouth daily. Reported on 07/06/2016    [provider]  phenazopyridine (PYRIDIUM) 200 MG tablet Take 1 tablet (200 mg total) by mouth 3 (three) times daily. 07/23/17   Maczis, Barth Kirks, PA-C  polyethylene glycol (MIRALAX / GLYCOLAX) packet Take 17 g by mouth daily.    [provider]  predniSONE (DELTASONE) 2.5 MG tablet Take 2.5 mg by mouth daily with breakfast.    [provider]  QUEtiapine (SEROQUEL) 25 MG tablet Take 3 tablets (75 mg total) by mouth at bedtime. 07/17/17   Arfeen, Arlyce Harman, MD  rosuvastatin (CRESTOR) 20 MG tablet Take 20 mg by mouth daily.    [provider]  sertraline (ZOLOFT) 100 MG tablet Take 2 tablets (200 mg total) by mouth daily. 07/17/17   Arfeen, Arlyce Harman, MD  Skin Protectants, Misc. (BAZA PROTECT EX) Apply 1 application  topically 3 (three) times daily as needed (dermatitis).     [provider]  Skin Protectants, Misc. (BAZA PROTECT EX) Apply topically 2 (two) times daily.    [provider]  traZODone (DESYREL) 50 MG tablet Take 1 tablet (50 mg total) by mouth at bedtime. 01/17/17   Arfeen, Arlyce Harman, MD    Family History Family History  Problem Relation Age of Onset  . Depression Father   . Cancer Mother   . Heart disease Mother     Social History Social History  Substance Use Topics  . Smoking status: Never Smoker  . Smokeless tobacco: Never Used  . Alcohol use No     Comment: occasional     Allergies   Plant sterols and stanols   Review of Systems Review of Systems  All other systems reviewed and are negative.    Physical Exam Updated Vital Signs BP (!) 144/79 (BP Location: Right Arm)   Pulse 62   Temp 97.6 F (36.4 C) (Oral)   Resp 18   Ht 5\' 10"  (1.778 m)   Wt 76.2 kg (168 lb)   SpO2 100%   BMI 24.11 kg/m   Physical Exam  Constitutional: He is oriented to person, place, and time. He appears well-developed and well-nourished.  Non-toxic appearing  HENT:  Head: Normocephalic and atraumatic. Head is without raccoon's eyes and without Battle's sign.  Right Ear: Hearing, tympanic membrane and external ear normal.  Left Ear: Hearing, tympanic membrane and external ear normal.  Nose: Nose normal.  Mouth/Throat: Oropharynx is clear and moist.  Healing laceration with bandage on the right side of forehead.  2 cm healing laceration on left side of forehead with bandage in place.  Eyes: Pupils are equal, round, and reactive to light. Right eye exhibits no discharge. Left eye exhibits no discharge. No scleral icterus.  Neck: Trachea normal, normal range of motion and full passive range of motion without pain. Neck supple. No spinous process tenderness present. Normal range of motion present.  Cardiovascular: Normal rate, regular rhythm and intact distal pulses.     No murmur heard. Pulses:      Radial pulses are 2+ on the right side, and 2+ on the left side.       Dorsalis pedis pulses are 2+ on the right side, and 2+ on the left side.       Posterior tibial pulses  are 2+ on the right side, and 2+ on the left side.  No lower extremity swelling or edema. Calves symmetric in size bilaterally.  Pulmonary/Chest: Effort normal and breath sounds normal. He exhibits no tenderness.  Abdominal: Soft. Bowel sounds are normal. There is no tenderness. There is no rebound, no guarding and no CVA tenderness.  Genitourinary:  Genitourinary Comments: Catheter in place.  Musculoskeletal: He exhibits no edema.  Multiple old contusions and abrasions are noted on bilateral arms. Moves all 4 extremities grossly without pain. Negative log roll test bilaterally. No sacral creptius.   Lymphadenopathy:    He has no cervical adenopathy.  Neurological: He is alert and oriented to person, place, and time.  Speech clear. Follows commands. No facial droop. PERRLA. EOMI. Normal peripheral fields. CN III-XII intact.  Grossly moves all extremities 4 without ataxia. Coordination intact. Able and appropriate strength for age to upper and lower extremities bilaterally including grip strength. Sensation to light touch intact bilaterally for upper and lower. Normal finger to nose and rapid alternating movements. Normal heel to shin balance.  No pronator drift.   Skin: Skin is warm and dry. No rash noted. He is not diaphoretic.  Psychiatric: He has a normal mood and affect.  Nursing note and vitals reviewed.    ED Treatments / Results  Labs (all labs ordered are listed, but only abnormal results are displayed) Labs Reviewed  URINALYSIS, ROUTINE W REFLEX MICROSCOPIC - Abnormal; Notable for the following:       Result Value   Ketones, ur 5 (*)    Leukocytes, UA MODERATE (*)    Bacteria, UA MANY (*)    All other components within normal limits  I-STAT CHEM 8, ED - Abnormal; Notable  for the following:    BUN 36 (*)    Hemoglobin 11.2 (*)    HCT 33.0 (*)    All other components within normal limits  URINE CULTURE    EKG  EKG Interpretation None       Radiology No results found.  Procedures Procedures (including critical care time)  Medications Ordered in ED Medications  cephALEXin (KEFLEX) capsule 500 mg (500 mg Oral Given 07/23/17 1635)     Initial Impression / Assessment and Plan / ED Course  I have reviewed the triage vital signs and the nursing notes.  Pertinent labs & imaging results that were available during my care of the patient were reviewed by me and considered in my medical decision making (see chart for details).     82 year old male who presents from assisted living facility for frequent falls out of wheelchair. Vitals signs reassuring. Non-toxic appearing on presentation. A&Ox3. Patient without signs of serious head, neck, or back injury. Normal neurological exam compared to baseline (patient is wheelchair dependent). No concern for closed head injury at this time. Multiple contusions and abrasions noted that appear old and healing. Basic labs and UA ordered.   Chem 8 reassuring. UA with UTI.  Pt is afebrile, no CVA tenderness, normotensive, and denies N/V. Pt to be dc home with antibiotics and instructions to follow up with PCP if symptoms persist. Urine culture sent. Advised assisted living to have patient placed in [place]/seatbelt while in wheelchair to prevent future falls. Advised the patient to keep neuro appt. The evaluation does not show pathology that would require ongoing emergent intervention or inpatient treatment. I advised the patient to return to the emergency department with new or worsening symptoms or new concerns. Specific return precautions discussed.  The patient verbalized understanding and agreement with plan. All questions answered. No further questions at this time. The patient is hemodynamically stable, mentating  appropriately and appears safe for discharge.  Patient case seen and discussed with Dr. Jeneen Rinks who is in agreement with plan.    Final Clinical Impressions(s) / ED Diagnoses   Final diagnoses:  Acute cystitis without hematuria  Fall, initial encounter    New Prescriptions New Prescriptions   CEPHALEXIN (KEFLEX) 500 MG CAPSULE    Take 1 capsule (500 mg total) by mouth 2 (two) times daily.   PHENAZOPYRIDINE (PYRIDIUM) 200 MG TABLET    Take 1 tablet (200 mg total) by mouth 3 (three) times daily.     Jillyn Ledger, PA-C 07/23/17 1643    Tanna Furry, MD 07/26/17 [phone]

## 2017-07-23 NOTE — ED Triage Notes (Signed)
Per EMS: Pt is coming from [facility]. Pt is being sent out by facility staff for evaluation for recurrent falls. He has fallen 4 times in the last five days and twice today.  Pt reports no pain or injuries Pt has hx of parkinson's and reports he does not feel weaker than normal or feel as if he is having more tremors than normal.   Last Vitals: 107/60 HR 59 RR 16 SPO2 99% on RA CBG 132

## 2017-07-25 LAB — URINE CULTURE

## 2017-07-27 ENCOUNTER — Ambulatory Visit (HOSPITAL_COMMUNITY): Payer: Self-pay | Admitting: Psychiatry

## 2017-08-05 ENCOUNTER — Inpatient Hospital Stay (HOSPITAL_COMMUNITY)
Admission: EM | Admit: 2017-08-05 | Discharge: 2017-08-08 | DRG: 698 | Disposition: A | Discharge status: Nursing Home (Includes ICF) | Payer: Medicare Other | Attending: Family Medicine | Admitting: Family Medicine

## 2017-08-05 ENCOUNTER — Emergency Department (HOSPITAL_COMMUNITY): Payer: Medicare Other

## 2017-08-05 ENCOUNTER — Encounter (HOSPITAL_COMMUNITY): Payer: Self-pay

## 2017-08-05 DIAGNOSIS — S0093XA Contusion of unspecified part of head, initial encounter: Secondary | ICD-10-CM | POA: Diagnosis present

## 2017-08-05 DIAGNOSIS — Y846 Urinary catheterization as the cause of abnormal reaction of the patient, or of later complication, without mention of misadventure at the time of the procedure: Secondary | ICD-10-CM | POA: Diagnosis present

## 2017-08-05 DIAGNOSIS — I1310 Hypertensive heart and chronic kidney disease without heart failure, with stage 1 through stage 4 chronic kidney disease, or unspecified chronic kidney disease: Secondary | ICD-10-CM | POA: Diagnosis present

## 2017-08-05 DIAGNOSIS — R410 Disorientation, unspecified: Secondary | ICD-10-CM | POA: Diagnosis not present

## 2017-08-05 DIAGNOSIS — N183 Chronic kidney disease, stage 3 (moderate): Secondary | ICD-10-CM | POA: Diagnosis present

## 2017-08-05 DIAGNOSIS — F419 Anxiety disorder, unspecified: Secondary | ICD-10-CM | POA: Diagnosis present

## 2017-08-05 DIAGNOSIS — E860 Dehydration: Secondary | ICD-10-CM | POA: Diagnosis present

## 2017-08-05 DIAGNOSIS — N319 Neuromuscular dysfunction of bladder, unspecified: Secondary | ICD-10-CM | POA: Diagnosis present

## 2017-08-05 DIAGNOSIS — Z818 Family history of other mental and behavioral disorders: Secondary | ICD-10-CM | POA: Diagnosis not present

## 2017-08-05 DIAGNOSIS — E118 Type 2 diabetes mellitus with unspecified complications: Secondary | ICD-10-CM | POA: Diagnosis not present

## 2017-08-05 DIAGNOSIS — T83518A Infection and inflammatory reaction due to other urinary catheter, initial encounter: Principal | ICD-10-CM | POA: Diagnosis present

## 2017-08-05 DIAGNOSIS — F319 Bipolar disorder, unspecified: Secondary | ICD-10-CM

## 2017-08-05 DIAGNOSIS — E875 Hyperkalemia: Secondary | ICD-10-CM | POA: Diagnosis present

## 2017-08-05 DIAGNOSIS — Z7902 Long term (current) use of antithrombotics/antiplatelets: Secondary | ICD-10-CM | POA: Diagnosis not present

## 2017-08-05 DIAGNOSIS — W19XXXA Unspecified fall, initial encounter: Secondary | ICD-10-CM | POA: Diagnosis present

## 2017-08-05 DIAGNOSIS — D72829 Elevated white blood cell count, unspecified: Secondary | ICD-10-CM | POA: Diagnosis present

## 2017-08-05 DIAGNOSIS — Z8249 Family history of ischemic heart disease and other diseases of the circulatory system: Secondary | ICD-10-CM

## 2017-08-05 DIAGNOSIS — E1122 Type 2 diabetes mellitus with diabetic chronic kidney disease: Secondary | ICD-10-CM | POA: Diagnosis present

## 2017-08-05 DIAGNOSIS — R296 Repeated falls: Secondary | ICD-10-CM | POA: Diagnosis present

## 2017-08-05 DIAGNOSIS — Z960 Presence of urogenital implants: Secondary | ICD-10-CM

## 2017-08-05 DIAGNOSIS — N390 Urinary tract infection, site not specified: Secondary | ICD-10-CM | POA: Diagnosis not present

## 2017-08-05 DIAGNOSIS — L8990 Pressure ulcer of unspecified site, unspecified stage: Secondary | ICD-10-CM | POA: Insufficient documentation

## 2017-08-05 DIAGNOSIS — N12 Tubulo-interstitial nephritis, not specified as acute or chronic: Secondary | ICD-10-CM | POA: Diagnosis not present

## 2017-08-05 DIAGNOSIS — E119 Type 2 diabetes mellitus without complications: Secondary | ICD-10-CM

## 2017-08-05 DIAGNOSIS — K219 Gastro-esophageal reflux disease without esophagitis: Secondary | ICD-10-CM | POA: Diagnosis present

## 2017-08-05 DIAGNOSIS — Z978 Presence of other specified devices: Secondary | ICD-10-CM

## 2017-08-05 DIAGNOSIS — R319 Hematuria, unspecified: Secondary | ICD-10-CM | POA: Diagnosis not present

## 2017-08-05 DIAGNOSIS — G92 Toxic encephalopathy: Secondary | ICD-10-CM | POA: Diagnosis present

## 2017-08-05 DIAGNOSIS — F0281 Dementia in other diseases classified elsewhere with behavioral disturbance: Secondary | ICD-10-CM | POA: Diagnosis present

## 2017-08-05 DIAGNOSIS — Z9289 Personal history of other medical treatment: Secondary | ICD-10-CM | POA: Diagnosis not present

## 2017-08-05 DIAGNOSIS — N400 Enlarged prostate without lower urinary tract symptoms: Secondary | ICD-10-CM | POA: Diagnosis present

## 2017-08-05 DIAGNOSIS — N136 Pyonephrosis: Secondary | ICD-10-CM | POA: Diagnosis present

## 2017-08-05 DIAGNOSIS — G20 Parkinson's disease: Secondary | ICD-10-CM | POA: Diagnosis present

## 2017-08-05 DIAGNOSIS — Z8614 Personal history of Methicillin resistant Staphylococcus aureus infection: Secondary | ICD-10-CM

## 2017-08-05 DIAGNOSIS — Z79899 Other long term (current) drug therapy: Secondary | ICD-10-CM

## 2017-08-05 DIAGNOSIS — Z87440 Personal history of urinary (tract) infections: Secondary | ICD-10-CM

## 2017-08-05 DIAGNOSIS — Z66 Do not resuscitate: Secondary | ICD-10-CM | POA: Diagnosis present

## 2017-08-05 DIAGNOSIS — G9341 Metabolic encephalopathy: Secondary | ICD-10-CM | POA: Diagnosis not present

## 2017-08-05 DIAGNOSIS — Z96649 Presence of unspecified artificial hip joint: Secondary | ICD-10-CM | POA: Diagnosis present

## 2017-08-05 DIAGNOSIS — A419 Sepsis, unspecified organism: Secondary | ICD-10-CM

## 2017-08-05 DIAGNOSIS — Z7982 Long term (current) use of aspirin: Secondary | ICD-10-CM

## 2017-08-05 DIAGNOSIS — Z7952 Long term (current) use of systemic steroids: Secondary | ICD-10-CM

## 2017-08-05 LAB — URINALYSIS, ROUTINE W REFLEX MICROSCOPIC
Bilirubin Urine: NEGATIVE
GLUCOSE, UA: NEGATIVE mg/dL
KETONES UR: NEGATIVE mg/dL
Nitrite: NEGATIVE
PROTEIN: 30 mg/dL — AB
Specific Gravity, Urine: 1.009 (ref 1.005–1.030)
Squamous Epithelial / LPF: NONE SEEN
pH: 6 (ref 5.0–8.0)

## 2017-08-05 LAB — CBC WITH DIFFERENTIAL/PLATELET
BASOS PCT: 0 %
Basophils Absolute: 0 10*3/uL (ref 0.0–0.1)
EOS ABS: 0 10*3/uL (ref 0.0–0.7)
EOS PCT: 0 %
HEMATOCRIT: 39.5 % (ref 39.0–52.0)
HEMOGLOBIN: 13.5 g/dL (ref 13.0–17.0)
LYMPHS PCT: 4 %
Lymphs Abs: 1.2 10*3/uL (ref 0.7–4.0)
MCH: 31 pg (ref 26.0–34.0)
MCHC: 34.2 g/dL (ref 30.0–36.0)
MCV: 90.6 fL (ref 78.0–100.0)
MONOS PCT: 2 %
Monocytes Absolute: 0.6 10*3/uL (ref 0.1–1.0)
NEUTROS ABS: 27.2 10*3/uL — AB (ref 1.7–7.7)
NEUTROS PCT: 94 %
Platelets: 289 10*3/uL (ref 150–400)
RBC: 4.36 MIL/uL (ref 4.22–5.81)
RDW: 14.9 % (ref 11.5–15.5)
WBC: 29 10*3/uL — ABNORMAL HIGH (ref 4.0–10.5)

## 2017-08-05 LAB — BASIC METABOLIC PANEL
ANION GAP: 10 (ref 5–15)
BUN: 31 mg/dL — ABNORMAL HIGH (ref 6–20)
CHLORIDE: 103 mmol/L (ref 101–111)
CO2: 24 mmol/L (ref 22–32)
Calcium: 9.3 mg/dL (ref 8.9–10.3)
Creatinine, Ser: 1.28 mg/dL — ABNORMAL HIGH (ref 0.61–1.24)
GFR calc non Af Amer: 51 mL/min — ABNORMAL LOW (ref >60)
GFR, EST AFRICAN AMERICAN: 59 mL/min — AB (ref >60)
GLUCOSE: 156 mg/dL — AB (ref 65–99)
POTASSIUM: 5.3 mmol/L — AB (ref 3.5–5.1)
Sodium: 137 mmol/L (ref 135–145)

## 2017-08-05 LAB — PROCALCITONIN: Procalcitonin: 0.77 ng/mL

## 2017-08-05 LAB — I-STAT CG4 LACTIC ACID, ED: LACTIC ACID, VENOUS: 1.36 mmol/L (ref 0.5–1.9)

## 2017-08-05 LAB — MRSA PCR SCREENING: MRSA by PCR: POSITIVE — AB

## 2017-08-05 MED ORDER — BISACODYL 10 MG RE SUPP: 10.0000 mg | RECTAL | Status: DC | PRN

## 2017-08-05 MED ORDER — CLOPIDOGREL BISULFATE 75 MG PO TABS
75.0000 mg | ORAL_TABLET | Freq: Every evening | ORAL | Status: DC
  Administered 2017-08-06: 75 mg via ORAL
  Filled 2017-08-05: qty 1

## 2017-08-05 MED ORDER — HYDRALAZINE HCL 25 MG PO TABS
25.0000 mg | ORAL_TABLET | Freq: Two times a day (BID) | ORAL | Status: DC
  Administered 2017-08-06 – 2017-08-08 (×6): 25 mg via ORAL
  Filled 2017-08-05 (×6): qty 1

## 2017-08-05 MED ORDER — ENTACAPONE 200 MG PO TABS
200.0000 mg | ORAL_TABLET | Freq: Three times a day (TID) | ORAL | Status: DC
  Administered 2017-08-06 – 2017-08-08 (×9): 200 mg via ORAL
  Filled 2017-08-05 (×11): qty 1

## 2017-08-05 MED ORDER — FERROUS SULFATE 325 (65 FE) MG PO TABS
325.0000 mg | ORAL_TABLET | Freq: Two times a day (BID) | ORAL | Status: DC
  Administered 2017-08-06 – 2017-08-08 (×6): 325 mg via ORAL
  Filled 2017-08-05 (×6): qty 1

## 2017-08-05 MED ORDER — VANCOMYCIN HCL IN DEXTROSE 1-5 GM/200ML-% IV SOLN
1000.0000 mg | Freq: Once | INTRAVENOUS | Status: AC
  Administered 2017-08-06: 1000 mg via INTRAVENOUS
  Filled 2017-08-05: qty 200

## 2017-08-05 MED ORDER — ASPIRIN 81 MG PO CHEW
81.0000 mg | CHEWABLE_TABLET | Freq: Every day | ORAL | Status: DC
  Administered 2017-08-06: 81 mg via ORAL
  Filled 2017-08-05: qty 1

## 2017-08-05 MED ORDER — ENOXAPARIN SODIUM 40 MG/0.4ML ~~LOC~~ SOLN
40.0000 mg | SUBCUTANEOUS | Status: DC
  Administered 2017-08-06: 40 mg via SUBCUTANEOUS
  Filled 2017-08-05: qty 0.4

## 2017-08-05 MED ORDER — GUAIFENESIN 100 MG/5ML PO SOLN: 200.0000 mg | ORAL | Status: DC | PRN

## 2017-08-05 MED ORDER — KETOCONAZOLE 2 % EX CREA
1.0000 "application " | TOPICAL_CREAM | Freq: Every day | CUTANEOUS | Status: DC | PRN
  Filled 2017-08-05: qty 15

## 2017-08-05 MED ORDER — SODIUM CHLORIDE 0.9 % IV SOLN
INTRAVENOUS | Status: DC
  Administered 2017-08-05 – 2017-08-07 (×3): via INTRAVENOUS

## 2017-08-05 MED ORDER — ZINC OXIDE 40 % EX OINT
TOPICAL_OINTMENT | CUTANEOUS | Status: DC | PRN
  Administered 2017-08-06 (×2): 57 via TOPICAL
  Filled 2017-08-05: qty 57

## 2017-08-05 MED ORDER — ACETAMINOPHEN 325 MG PO TABS: 650.0000 mg | ORAL_TABLET | Freq: Four times a day (QID) | ORAL | Status: DC | PRN

## 2017-08-05 MED ORDER — ENSURE ENLIVE PO LIQD
237.0000 mL | Freq: Two times a day (BID) | ORAL | Status: DC
  Administered 2017-08-06 – 2017-08-08 (×6): 237 mL via ORAL

## 2017-08-05 MED ORDER — ROSUVASTATIN CALCIUM 20 MG PO TABS
20.0000 mg | ORAL_TABLET | Freq: Every day | ORAL | Status: DC
  Administered 2017-08-06 – 2017-08-08 (×3): 20 mg via ORAL
  Filled 2017-08-05 (×3): qty 1

## 2017-08-05 MED ORDER — PANTOPRAZOLE SODIUM 40 MG PO TBEC
40.0000 mg | DELAYED_RELEASE_TABLET | Freq: Every day | ORAL | Status: DC
  Administered 2017-08-06 – 2017-08-08 (×3): 40 mg via ORAL
  Filled 2017-08-05 (×3): qty 1

## 2017-08-05 MED ORDER — POLYETHYLENE GLYCOL 3350 17 G PO PACK
17.0000 g | PACK | Freq: Every day | ORAL | Status: DC
  Administered 2017-08-06 – 2017-08-08 (×4): 17 g via ORAL
  Filled 2017-08-05 (×4): qty 1

## 2017-08-05 MED ORDER — LEVOFLOXACIN IN D5W 750 MG/150ML IV SOLN
750.0000 mg | Freq: Once | INTRAVENOUS | Status: DC
  Administered 2017-08-05: 750 mg via INTRAVENOUS
  Filled 2017-08-05: qty 150

## 2017-08-05 MED ORDER — LAMOTRIGINE 25 MG PO TABS
150.0000 mg | ORAL_TABLET | Freq: Two times a day (BID) | ORAL | Status: DC
  Administered 2017-08-06 – 2017-08-08 (×6): 150 mg via ORAL
  Filled 2017-08-05 (×6): qty 2

## 2017-08-05 MED ORDER — HYDROCERIN EX CREA
1.0000 "application " | TOPICAL_CREAM | Freq: Two times a day (BID) | CUTANEOUS | Status: DC
  Administered 2017-08-06 – 2017-08-08 (×4): 1 via TOPICAL
  Filled 2017-08-05 (×2): qty 113

## 2017-08-05 MED ORDER — ACETAMINOPHEN 650 MG RE SUPP: 650.0000 mg | Freq: Four times a day (QID) | RECTAL | Status: DC | PRN

## 2017-08-05 MED ORDER — TRAZODONE HCL 50 MG PO TABS
50.0000 mg | ORAL_TABLET | Freq: Every day | ORAL | Status: DC
  Administered 2017-08-06 – 2017-08-07 (×3): 50 mg via ORAL
  Filled 2017-08-05 (×3): qty 1

## 2017-08-05 MED ORDER — PREDNISONE 5 MG PO TABS
2.5000 mg | ORAL_TABLET | Freq: Every day | ORAL | Status: DC
  Administered 2017-08-06 – 2017-08-08 (×3): 2.5 mg via ORAL
  Filled 2017-08-05 (×3): qty 1

## 2017-08-05 MED ORDER — SERTRALINE HCL 100 MG PO TABS
200.0000 mg | ORAL_TABLET | Freq: Every day | ORAL | Status: DC
  Administered 2017-08-06 – 2017-08-08 (×3): 200 mg via ORAL
  Filled 2017-08-05 (×3): qty 2

## 2017-08-05 MED ORDER — QUETIAPINE FUMARATE 25 MG PO TABS
75.0000 mg | ORAL_TABLET | Freq: Every day | ORAL | Status: DC
  Administered 2017-08-06 – 2017-08-07 (×3): 75 mg via ORAL
  Filled 2017-08-05 (×3): qty 3

## 2017-08-05 MED ORDER — BELLADONNA-OPIUM 16.2-30 MG RE SUPP: 30.0000 mg | Freq: Three times a day (TID) | RECTAL | Status: DC | PRN

## 2017-08-05 MED ORDER — ONDANSETRON HCL 4 MG PO TABS: 4.0000 mg | ORAL_TABLET | Freq: Four times a day (QID) | ORAL | Status: DC | PRN

## 2017-08-05 MED ORDER — ONDANSETRON HCL 4 MG/2ML IJ SOLN: 4.0000 mg | Freq: Four times a day (QID) | INTRAMUSCULAR | Status: DC | PRN

## 2017-08-05 MED ORDER — ALBUTEROL SULFATE (2.5 MG/3ML) 0.083% IN NEBU: 2.5000 mg | INHALATION_SOLUTION | RESPIRATORY_TRACT | Status: DC | PRN

## 2017-08-05 MED ORDER — ISOSORBIDE MONONITRATE ER 30 MG PO TB24
30.0000 mg | ORAL_TABLET | Freq: Every day | ORAL | Status: DC
  Administered 2017-08-06 – 2017-08-08 (×3): 30 mg via ORAL
  Filled 2017-08-05 (×3): qty 1

## 2017-08-05 MED ORDER — PHENAZOPYRIDINE HCL 200 MG PO TABS
200.0000 mg | ORAL_TABLET | Freq: Three times a day (TID) | ORAL | Status: DC
  Administered 2017-08-06 (×2): 200 mg via ORAL
  Filled 2017-08-05 (×3): qty 1

## 2017-08-05 MED ORDER — CLONAZEPAM 0.5 MG PO TABS: 0.2500 mg | ORAL_TABLET | Freq: Two times a day (BID) | ORAL | Status: DC | PRN

## 2017-08-05 MED ORDER — CARBIDOPA-LEVODOPA 25-250 MG PO TABS
1.0000 | ORAL_TABLET | Freq: Three times a day (TID) | ORAL | Status: DC
  Administered 2017-08-06 – 2017-08-08 (×9): 1 via ORAL
  Filled 2017-08-05 (×11): qty 1

## 2017-08-05 MED ORDER — SODIUM CHLORIDE 0.9 % IV SOLN
1.0000 g | Freq: Two times a day (BID) | INTRAVENOUS | Status: DC
  Administered 2017-08-06 – 2017-08-07 (×5): 1 g via INTRAVENOUS
  Filled 2017-08-05 (×6): qty 1

## 2017-08-05 NOTE — ED Notes (Signed)
Pt refused EKG.

## 2017-08-05 NOTE — Progress Notes (Signed)
Pharmacy Antibiotic Note  [name]. is a 82 y.o. male admitted on 08/05/2017 with UTI.  Pharmacy has been consulted for Meropenem dosing. Hx of Pseudomonal UTI 12/12/16, Citrobacter UTI 03/13/16, did not see any ESBL organisms.   Plan: Meropenem 1gm q12  Height: 5\' 10"  (177.8 cm) Weight: 168 lb (76.2 kg) IBW/kg (Calculated) : 73  Temp (24hrs), Avg:99.1 F (37.3 C), Min:99.1 F (37.3 C), Max:99.1 F (37.3 C)   Recent Labs Lab 08/05/17 1600 08/05/17 1832  WBC 29.0*  --   CREATININE 1.28*  --   LATICACIDVEN  --  1.36    Estimated Creatinine Clearance: 47.5 mL/min (A) (by C-G formula based on SCr of 1.28 mg/dL (H)).    Allergies  Allergen Reactions  . Plant Sterols And Stanols Other (See Comments)    ON MAR   Antimicrobials this admission: 8/19 Meropenem >>   Dose adjustments this admission:  Microbiology results: 8/19 BCx: sent 8/19 UCx: sent   Thank you for allowing pharmacy to be a part of this patient's care.  Minda Ditto 08/05/2017 7:47 PM

## 2017-08-05 NOTE — ED Notes (Signed)
Patient transported to CT 

## 2017-08-05 NOTE — ED Notes (Signed)
Upon assessment patient was found to have a foley in place. Foley had dried stool surrounding it and patient was covered in stool. White drainage noted around foley, Dr. Stark Jock notified and order to remove foley and place new one. Once foley removed bright red blood draining from urethra. Blood also in drainage bag. Dr. Stark Jock notified to assess.

## 2017-08-05 NOTE — ED Notes (Signed)
Please call inpatient for report at 2030. Number [phone]
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
## 2017-08-05 NOTE — ED Notes (Signed)
Bed: WA09 Expected date:  Expected time:  Means of arrival:  Comments: 82 yo behavioral issues
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
## 2017-08-06 ENCOUNTER — Inpatient Hospital Stay (HOSPITAL_COMMUNITY): Payer: Medicare Other

## 2017-08-06 DIAGNOSIS — G20 Parkinson's disease: Secondary | ICD-10-CM

## 2017-08-06 DIAGNOSIS — F02818 Dementia in other diseases classified elsewhere, unspecified severity, with other behavioral disturbance: Secondary | ICD-10-CM | POA: Diagnosis present

## 2017-08-06 DIAGNOSIS — G9341 Metabolic encephalopathy: Secondary | ICD-10-CM

## 2017-08-06 DIAGNOSIS — L8990 Pressure ulcer of unspecified site, unspecified stage: Secondary | ICD-10-CM | POA: Insufficient documentation

## 2017-08-06 DIAGNOSIS — N12 Tubulo-interstitial nephritis, not specified as acute or chronic: Secondary | ICD-10-CM | POA: Diagnosis present

## 2017-08-06 DIAGNOSIS — S0093XA Contusion of unspecified part of head, initial encounter: Secondary | ICD-10-CM | POA: Diagnosis present

## 2017-08-06 DIAGNOSIS — F0281 Dementia in other diseases classified elsewhere with behavioral disturbance: Secondary | ICD-10-CM | POA: Diagnosis present

## 2017-08-06 LAB — BASIC METABOLIC PANEL
ANION GAP: 9 (ref 5–15)
BUN: 28 mg/dL — AB (ref 6–20)
CO2: 22 mmol/L (ref 22–32)
Calcium: 8.5 mg/dL — ABNORMAL LOW (ref 8.9–10.3)
Chloride: 108 mmol/L (ref 101–111)
Creatinine, Ser: 1.18 mg/dL (ref 0.61–1.24)
GFR, EST NON AFRICAN AMERICAN: 56 mL/min — AB (ref >60)
Glucose, Bld: 126 mg/dL — ABNORMAL HIGH (ref 65–99)
POTASSIUM: 4 mmol/L (ref 3.5–5.1)
SODIUM: 139 mmol/L (ref 135–145)

## 2017-08-06 LAB — GLUCOSE, CAPILLARY: GLUCOSE-CAPILLARY: 145 mg/dL — AB (ref 65–99)

## 2017-08-06 LAB — CBC
HCT: 30.3 % — ABNORMAL LOW (ref 39.0–52.0)
HEMOGLOBIN: 10.1 g/dL — AB (ref 13.0–17.0)
MCH: 30.1 pg (ref 26.0–34.0)
MCHC: 33.3 g/dL (ref 30.0–36.0)
MCV: 90.2 fL (ref 78.0–100.0)
PLATELETS: 202 10*3/uL (ref 150–400)
RBC: 3.36 MIL/uL — AB (ref 4.22–5.81)
RDW: 14.8 % (ref 11.5–15.5)
WBC: 14.1 10*3/uL — AB (ref 4.0–10.5)

## 2017-08-06 MED ORDER — INSULIN ASPART 100 UNIT/ML ~~LOC~~ SOLN
0.0000 [IU] | Freq: Three times a day (TID) | SUBCUTANEOUS | Status: DC
Start: 2017-08-06 — End: 2017-08-08
  Administered 2017-08-06 – 2017-08-08 (×3): 1 [IU] via SUBCUTANEOUS

## 2017-08-06 MED ORDER — LISINOPRIL 5 MG PO TABS
5.0000 mg | ORAL_TABLET | Freq: Every day | ORAL | Status: DC
  Administered 2017-08-06 – 2017-08-08 (×3): 5 mg via ORAL
  Filled 2017-08-06 (×3): qty 1

## 2017-08-06 MED ORDER — VANCOMYCIN HCL IN DEXTROSE 750-5 MG/150ML-% IV SOLN
750.0000 mg | Freq: Two times a day (BID) | INTRAVENOUS | Status: DC
  Administered 2017-08-06 – 2017-08-07 (×4): 750 mg via INTRAVENOUS
  Filled 2017-08-06 (×5): qty 150

## 2017-08-06 NOTE — Care Management Note (Signed)
Case Management Note  Patient Details  Name: [name]. MRN: [id number] Date of Birth: [birth year]  Subjective/Objective: 82 y/o m admitted w/Pyelonephritis. Hx: dementia. From ALF-[name]. PT cons-await recc. CSW already following.                   Action/Plan:d/c plan ALF   Expected Discharge Date:                  Expected Discharge Plan:  Assisted Living / Rest Home  In-House Referral:  Clinical Social Work  Discharge planning Services  CM Consult  Post Acute Care Choice:    Choice offered to:     DME Arranged:    DME Agency:     HH Arranged:    HH Agency:     Status of Service:  In process, will continue to follow  If discussed at Long Length of Stay Meetings, dates discussed:    Additional Comments:  Dessa Phi, RN 08/06/2017, 12:11 PM

## 2017-08-06 NOTE — Progress Notes (Signed)
Pt noted to have scabs on Rt forehead, left elbow and Bi knees from previous falls. Perianal area red and bleeding.  Sacrum with Stage II skin injury.Blood draining from penis.

## 2017-08-06 NOTE — Progress Notes (Signed)
Pharmacy Antibiotic Note  [name]. is a 82 y.o. male admitted on 08/05/2017 with UTI.  Pharmacy has been consulted for Vancomycin dosing.  Plan: Vancomycin 750mg  iv q12hr Goal 15-20  Height: 5\' 10"  (177.8 cm) Weight: 162 lb 14.4 oz (73.9 kg) IBW/kg (Calculated) : 73  Temp (24hrs), Avg:98.9 F (37.2 C), Min:98.2 F (36.8 C), Max:99.1 F (37.3 C)   Recent Labs Lab 08/05/17 1600 08/05/17 1832  WBC 29.0*  --   CREATININE 1.28*  --   LATICACIDVEN  --  1.36    Estimated Creatinine Clearance: 47.5 mL/min (A) (by C-G formula based on SCr of 1.28 mg/dL (H)).    Allergies  Allergen Reactions  . Plant Sterols And Stanols Other (See Comments)    ON MAR    Antimicrobials this admission: Vancomycin 08/06/2017 >> Meropenem 08/05/2017 >>   Dose adjustments this admission: -  Microbiology results: pending  Thank you for allowing pharmacy to be a part of this patient's care.  Nani Skillern Crowford 08/06/2017 2:53 AM

## 2017-08-06 NOTE — Plan of Care (Signed)
Problem: Skin Integrity: Goal: Risk for impaired skin integrity will decrease Outcome: Progressing Cream applied to peri area for protection and healing

## 2017-08-06 NOTE — Clinical Social Work Note (Signed)
Clinical Social Work Assessment  Patient Details  Name: [name]. MRN: [id number] Date of Birth: [birth year]  Date of referral:  07/30/17               Reason for consult:  Facility Placement                Permission sought to share information with:  Facility Art therapist granted to share information::  Yes, Verbal Permission Granted  Name::        Agency::     Relationship::     Contact Information:     Housing/Transportation Living arrangements for the past 2 months:  [place] of Information:  Power of Attorney Patient Interpreter Needed:  None Criminal Activity/Legal Involvement Pertinent to Current Situation/Hospitalization:  No - Comment as needed Significant Relationships:  Friend [name] Heuskin [phone]) (POA)) Lives with:  Facility Resident Do you feel safe going back to the place where you live?  Yes Need for family participation in patient care:  Yes (Comment)  Care giving concerns:  Patient from [facility]. PT recommended SNF, patient plans to return to ALF at discharge. ALF reviewed PT note and confirmed patient's ability to return.    Social Worker assessment / plan:  CSW spoke with patient and patient's POA [name] Heuskin [phone]) at bedside. Patient oriented x person and has a history of dementia, minimal engagement during assessment. Patient's POA reported that patient has been at [facility] since 2014 and likes it. Patient's POA confirmed that the plan is for patient to return to current ALF at discharge. Patient's POA reported that patient will need PTAR for transport at discharge. PT evaluated patient and recommended SNF. CSW contacted [facility] and spoke with staff member Elmo Putt who agreed to review PT note. Staff contacted CSW and confirmed patient's ability to return after reviewing PT note. CSW updated patient's POA who confirmed patient's plan to return to ALF with PT.     Employment status:  Retired Forensic scientist:  Information systems manager, Programmer, applications PT Recommendations:  [place] / Referral to community resources:  Other (Comment Required) ([place])  Patient/Family's Response to care: Patient's POA appreciative of CSW assistance with discharge planning.   Patient/Family's Understanding of and Emotional Response to Diagnosis, Current Treatment, and Prognosis:Patient presented pleasant throughout assessment as evidenced by smile. Patient's POA involved in patient's care and verbalized understanding of diagnosis and current treatment. Patient's POA verbalized understanding of plan to discharge back to ALF with PT.    Emotional Assessment Appearance:  Appears stated age Attitude/Demeanor/Rapport:    Affect (typically observed):  Pleasant Orientation:  Oriented to Self Alcohol / Substance use:  Not Applicable Psych involvement (Current and /or in the community):  No (Comment)  Discharge Needs  Concerns to be addressed:  No discharge needs identified Readmission within the last 30 days:  No Current discharge risk:  None Barriers to Discharge:  Continued Medical Work up   [employer], LCSW 08/06/2017, 3:16 PM
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
## 2017-08-06 NOTE — Evaluation (Signed)
Physical Therapy Evaluation Patient Details Name: [name]. MRN: [id number] DOB: 09/20/1935 Today's Date: 08/06/2017   History of Present Illness  82 y.o. male with medical history significant of chronic indwelling Foley with recurrent UTIs, Parkinson's disease, bipolar disorder, and diabetes mellitus type 2; who was sent from his facility for reports of abnormal and aggressive behavior for the last few days.  Clinical Impression  Pt admitted with above diagnosis. Pt currently with functional limitations due to the deficits listed below (see PT Problem List). Max assist sit to stand, Mod A to pivot to recliner. At baseline, he is independent with WC transfers.  Pt will benefit from skilled PT to increase their independence and safety with mobility to allow discharge to the venue listed below.       Follow Up Recommendations SNF (will need physical assist for transfers and ADLs)    Equipment Recommendations  None recommended by PT    Recommendations for Other Services       Precautions / Restrictions Precautions Precautions: Fall Restrictions Weight Bearing Restrictions: No      Mobility  Bed Mobility Overal bed mobility: Needs Assistance Bed Mobility: Supine to Sit     Supine to sit: Mod assist     General bed mobility comments: assist to initiate movement and to raise trunk/advance BLEs  Transfers Overall transfer level: Needs assistance Equipment used: None Transfers: Sit to/from [employer] Transfers Sit to Stand: Max assist Stand pivot transfers: Mod assist  SPT x 2 (bed to 3 in 1, then to recliner)       General transfer comment: maX A to rise (pt 40%), mod A for SPT (pt 60%)  Ambulation/Gait             General Gait Details: WC bound at baseline  Stairs            Wheelchair Mobility    Modified Rankin (Stroke Patients Only)       Balance Overall balance assessment: Needs assistance Sitting-balance support: Feet  supported Sitting balance-Leahy Scale: Fair                                       Pertinent Vitals/Pain Pain Assessment: No/denies pain    Home Living Family/patient expects to be discharged to:: Skilled nursing facility                      Prior Function Level of Independence: Needs assistance   Gait / Transfers Assistance Needed: transfers independently to [facility], self propels WC           Hand Dominance        Extremity/Trunk Assessment   Upper Extremity Assessment Upper Extremity Assessment: Defer to OT evaluation    Lower Extremity Assessment Lower Extremity Assessment: Generalized weakness (B knee ext -25* AROM, knee ext -4/5 in available range)    Cervical / Trunk Assessment Cervical / Trunk Assessment: Normal  Communication   Communication: HOH  Cognition Arousal/Alertness: Awake/alert Behavior During Therapy: WFL for tasks assessed/performed Overall Cognitive Status: No family/caregiver present to determine baseline cognitive functioning                                 General Comments: pt oriented to location and self, not to situation nor date, repeats same question multiple times, can follow commands  General Comments      Exercises     Assessment/Plan    PT Assessment Patient needs continued PT services  PT Problem List Decreased strength;Decreased range of motion;Decreased activity tolerance;Decreased balance;Decreased mobility       PT Treatment Interventions Gait training;Functional mobility training;Therapeutic activities;Therapeutic exercise;Patient/family education    PT Goals (Current goals can be found in the Care Plan section)  Acute Rehab PT Goals PT Goal Formulation: Patient unable to participate in goal setting Time For Goal Achievement: 08/20/17 Potential to Achieve Goals: Fair    Frequency Min 3X/week   Barriers to discharge        Co-evaluation               AM-PAC PT  "6 Clicks" Daily Activity  Outcome Measure Difficulty turning over in bed (including adjusting bedclothes, sheets and blankets)?: Unable Difficulty moving from lying on back to sitting on the side of the bed? : Unable Difficulty sitting down on and standing up from a chair with arms (e.g., wheelchair, bedside commode, etc,.)?: Unable Help needed moving to and from a bed to chair (including a wheelchair)?: A Lot Help needed walking in hospital room?: Total Help needed climbing 3-5 steps with a railing? : Total 6 Click Score: 7    End of Session Equipment Utilized During Treatment: Gait belt Activity Tolerance: Patient tolerated treatment well Patient left: in chair;with call bell/phone within reach;with chair alarm set Nurse Communication: Mobility status PT Visit Diagnosis: History of falling (Z91.81);Muscle weakness (generalized) (M62.81)    Time: 6063-0160 PT Time Calculation (min) (ACUTE ONLY): 34 min   Charges:   PT Evaluation $PT Eval Moderate Complexity: 1 Mod PT Treatments $Therapeutic Activity: 8-22 mins   PT G Codes:          Philomena Doheny 08/06/2017, 12:39 PM [phone]

## 2017-08-06 NOTE — Consult Note (Signed)
Urology Consult Note    Requesting Attending Physician:  Baird Lyons* Service Requesting Consult:  Internal Medicine Service Providing Consult: Urology  Consulting Attending: Dr. Franchot Gallo  Reason for Consult:  UTI  [name]. is seen in consultation for reasons noted above at the request of Baird Lyons* on the internal medicine  This is a 83 y.o. yo patient with a history of Parkinson's disease, bipolar disorder, neurogenic bladder managed with a chronic indwelling foley catheter, recurrent UTIs, DM and dementia. Patient's bladder has been managed with a chronic foley catheter for 15 years, and sees Dr. Matilde Sprang as an outpatient.   He has been admitted to the hospital several times for unrelated complaints (mostly mechanical falls, pnuemonia). Each time, UA was obtained, occasionally urine culture sent.  Over the last year, he has only presented twice in May 2018 with 'symptoms' of a UTI (urinary catheter pain). Both times urine culture growth was insignificant. 03/13/16 - Citrobacter resistant to cefazolin, ceftriaxone, zosyn 12/12/16 - Pseudomonas, pan sensitive  Yesterday he presented to the ED with altered mental status and erratic behaviors at the nursing home he lives in.  Foley catheter was noted to have gross blood clots and hematuria, and patient was started on CBI. Patient has been afebrile with normal HR and BP since admission. WBC noted to be 29k on arrival, UA obvious dirty due to chronic foley colonization. Urine and blood cultures were sent, and patient was given levofloxacin and transitioned to meropenem and vancomycin. CT abdomen pelvis was obtained which demonstrates moderate bilateral hydroureteronephrosis to the level of the bladder bilaterally, with bilateral perirenal fat stranding. The bladder appears decompressed with some hyperdense material within it.   Past Medical History: Past Medical History:  Diagnosis Date  . Anxiety    . Arthritis   . Benign prostate hyperplasia   . Bipolar disorder ([place])   . C2 cervical fracture ([name])    due to pt fall  . Chronic indwelling Foley catheter   . Depression   . Diabetes mellitus type II   . Falls   . Flu   . Hearing aid worn   . Hypertension   . Memory difficulty 08/21/2014  . Memory loss   . MRSA infection (methicillin-resistant Staphylococcus aureus)   . Neuromuscular disorder ([place])    parkinsons  . Parkinson disease ([place])   . Pneumonia   . SIRS (systemic inflammatory response syndrome) ([name])   . UTI (urinary tract infection)     Past Surgical History:  Past Surgical History:  Procedure Laterality Date  . TOTAL HIP ARTHROPLASTY    . VIDEO ASSISTED THORACOSCOPY (VATS)/EMPYEMA  12/16/2012   Procedure: VIDEO ASSISTED THORACOSCOPY (VATS)/EMPYEMA;  Surgeon: Melrose Nakayama, MD;  Location: [facility];  Service: Thoracic;  Laterality: Right;  Marland Kitchen VIDEO BRONCHOSCOPY  12/16/2012   Procedure: VIDEO BRONCHOSCOPY;  Surgeon: Melrose Nakayama, MD;  [facility];  Service: Thoracic;  Laterality: Right;    Medication: Current Facility-Administered Medications  Medication Dose Route Frequency Provider Last Rate Last Dose  . 0.9 %  sodium chloride infusion   Intravenous Continuous Fuller Plan A, MD 75 mL/hr at 08/06/17 1029    . acetaminophen (TYLENOL) tablet 650 mg  650 mg Oral Q6H PRN Fuller Plan A, MD       Or  . acetaminophen (TYLENOL) suppository 650 mg  650 mg Rectal Q6H PRN Smith, Rondell A, MD      . albuterol (PROVENTIL) (2.5 MG/3ML) 0.083% nebulizer solution 2.5 mg  2.5 mg Nebulization Q4H PRN Fuller Plan A, MD      . bisacodyl (DULCOLAX) suppository 10 mg  10 mg Rectal PRN Fuller Plan A, MD      . carbidopa-levodopa (SINEMET IR) 25-250 MG per tablet immediate release 1 tablet  1 tablet Oral TID WC Smith, Rondell A, MD   1 tablet at 08/06/17 1300  . clonazePAM (KLONOPIN) tablet 0.25 mg  0.25 mg Oral BID PRN Fuller Plan A, MD      . entacapone  (COMTAN) tablet 200 mg  200 mg Oral TID WC Smith, Rondell A, MD   200 mg at 08/06/17 1259  . feeding supplement (ENSURE ENLIVE) (ENSURE ENLIVE) liquid 237 mL  237 mL Oral BID BM Smith, Rondell A, MD   237 mL at 08/06/17 1400  . ferrous sulfate tablet 325 mg  325 mg Oral BID Fuller Plan A, MD   325 mg at 08/06/17 0350  . guaiFENesin (ROBITUSSIN) 100 MG/5ML solution 200 mg  200 mg Oral Q4H PRN Smith, Rondell A, MD      . hydrALAZINE (APRESOLINE) tablet 25 mg  25 mg Oral BID Fuller Plan A, MD   25 mg at 08/06/17 0938  . hydrocerin (EUCERIN) cream 1 application  1 application Topical BID Norval Morton, MD   1 application at 18/29/93 0124  . insulin aspart (novoLOG) injection 0-9 Units  0-9 Units Subcutaneous TID WC Dessa Phi Chahn-Yang, DO      . isosorbide mononitrate (IMDUR) 24 hr tablet 30 mg  30 mg Oral Daily Smith, Rondell A, MD   30 mg at 08/06/17 7169  . ketoconazole (NIZORAL) 2 % cream 1 application  1 application Topical Daily PRN Fuller Plan A, MD      . lamoTRIgine (LAMICTAL) tablet 150 mg  150 mg Oral BID Fuller Plan A, MD   150 mg at 08/06/17 0916  . lisinopril (PRINIVIL,ZESTRIL) tablet 5 mg  5 mg Oral Daily Dessa Phi Chahn-Yang, DO   5 mg at 08/06/17 1508  . liver oil-zinc oxide (DESITIN) 40 % ointment   Topical PRN Schorr, Rhetta Mura, NP   57 application at 67/89/38 1511  . meropenem (MERREM) 1 g in sodium chloride 0.9 % 100 mL IVPB  1 g Intravenous Q12H Norval Morton, MD   Stopped at 08/06/17 [phone]  . ondansetron (ZOFRAN) tablet 4 mg  4 mg Oral Q6H PRN Fuller Plan A, MD       Or  . ondansetron (ZOFRAN) injection 4 mg  4 mg Intravenous Q6H PRN Smith, Rondell A, MD      . pantoprazole (PROTONIX) EC tablet 40 mg  40 mg Oral Daily Tamala Julian, Rondell A, MD   40 mg at 08/06/17 0918  . polyethylene glycol (MIRALAX / GLYCOLAX) packet 17 g  17 g Oral Daily Fuller Plan A, MD   17 g at 08/06/17 5102  . predniSONE (DELTASONE) tablet 2.5 mg  2.5 mg Oral Q breakfast  Fuller Plan A, MD   2.5 mg at 08/06/17 0917  . QUEtiapine (SEROQUEL) tablet 75 mg  75 mg Oral QHS Smith, Rondell A, MD   75 mg at 08/06/17 0121  . rosuvastatin (CRESTOR) tablet 20 mg  20 mg Oral Daily Fuller Plan A, MD   20 mg at 08/06/17 0917  . sertraline (ZOLOFT) tablet 200 mg  200 mg Oral Daily Tamala Julian, Rondell A, MD   200 mg at 08/06/17 0917  . traZODone (DESYREL) tablet 50 mg  50 mg Oral QHS  Norval Morton, MD   50 mg at 08/06/17 0122  . vancomycin (VANCOCIN) IVPB 750 mg/150 ml premix  750 mg Intravenous Q12H Norval Morton, MD   Stopped at 08/06/17 1129    Allergies: Allergies  Allergen Reactions  . Plant Sterols And Stanols Other (See Comments)    ON MAR    Social History: Social History  Substance Use Topics  . Smoking status: Never Smoker  . Smokeless tobacco: Never Used  . Alcohol use No     Comment: occasional    Family History Family History  Problem Relation Age of Onset  . Depression Father   . Cancer Mother   . Heart disease Mother     Review of Systems 10 systems were reviewed and are negative except as noted specifically in the HPI.  Objective   Vital signs in last 24 hours: BP (!) 120/56   Pulse 61   Temp 97.8 F (36.6 C) (Oral)   Resp 17   Ht 5\' 10"  (1.778 m)   Wt 73.9 kg (162 lb 14.4 oz)   SpO2 99%   BMI 23.37 kg/m   Intake/Output last 3 shifts: I/O last 3 completed shifts: In: 10098.8 [P.O.:240; I.V.:608.8; Other:9000; IV Piggyback:250] Out: 14400 [[id number]]  Physical Exam General: NAD, A&O, resting, appropriate HEENT: [place]/AT, EOMI, MMM Pulmonary: Normal work of breathing on room air  Cardiovascular: HDS, adequate peripheral perfusion Abdomen: soft, NTTP, nondistended, no suprapubic fullness or tendernes GU: no, bilateral CVA tenderness. Foley catheter with clear yellow/orange urine in tubing  Extremities: warm and well perfused, no edema Neuro: Appropriate, no focal neurological deficits  Most Recent Labs: Lab Results   Component Value Date   WBC 14.1 (H) 08/06/2017   HGB 10.1 (L) 08/06/2017   HCT 30.3 (L) 08/06/2017   PLT 202 08/06/2017    Lab Results  Component Value Date   NA 139 08/06/2017   K 4.0 08/06/2017   CL 108 08/06/2017   CO2 22 08/06/2017   BUN 28 (H) 08/06/2017   CREATININE 1.18 08/06/2017   CALCIUM 8.5 (L) 08/06/2017   MG 2.2 10/19/2016    Lab Results  Component Value Date   ALKPHOS 82 03/13/2016   BILITOT 0.8 03/13/2016   PROT 6.0 (L) 03/13/2016   ALBUMIN 3.6 03/13/2016   ALT 6 (L) 03/13/2016   AST 21 03/13/2016    Lab Results  Component Value Date   INR 1.08 03/13/2016   APTT 33 12/15/2012     Urine Culture:Pending   IMAGING: Dg Chest 2 View  Result Date: 08/05/2017 CLINICAL DATA:  Leukocytosis. EXAM: CHEST  2 VIEW COMPARISON:  12/12/2016 CXR FINDINGS: Stable cardiomegaly with aortic atherosclerosis. Trace fluid or pleural thickening along the minor fissure. No acute pneumonic consolidation, effusion or pneumothorax. Chronic left fifth through eighth rib fractures. Chronic osteoarthritic joint space narrowing, spurring and sclerosis about both glenohumeral joints and left AC joint. Stable bone island of the distal left clavicle. IMPRESSION: 1. Stable cardiomegaly with aortic atherosclerosis. 2. No acute pulmonary disease. 3. Chronic left fifth through eighth rib fractures. 4. 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   On: 08/05/2017 18:29   Ct Head Wo Contrast  Result Date: 08/06/2017 CLINICAL DATA:  Recurrent falls. History of Parkinson's disease, hypertension and diabetes. EXAM: CT HEAD WITHOUT CONTRAST TECHNIQUE: Contiguous axial images were obtained from the base of the skull through the vertex without intravenous contrast. COMPARISON:  CT HEAD July 21, 2017 an  MRI of the brain December 20, 2016 FINDINGS: BRAIN: No intraparenchymal hemorrhage, mass effect nor midline shift. Moderate to severe ventriculomegaly  on the basis of global parenchymal brain volume loss as there is overall commensurate enlargement of cerebral sulci and cerebellar folia. Patchy supratentorial white matter hypodensities. No acute large vascular territory infarcts. No abnormal extra-axial fluid collections. Posterior fossa arachnoid cyst without mass effect. Basal cisterns are patent. VASCULAR: Severe calcific atherosclerosis of the carotid siphons and included vertebral artery's. SKULL: No skull fracture. No significant scalp soft tissue swelling. SINUSES/ORBITS: Trace paranasal sinus mucosal thickening. Under pneumatized mastoid air cells. The included ocular globes and orbital contents are non-suspicious. OTHER: None. IMPRESSION: 1. No acute intracranial process. 2. Stable examination including moderate to severe atrophy and moderate chronic small vessel ischemic disease. Electronically Signed   By: Elon Alas M.D.   On: 08/06/2017 02:13   Ct Renal Stone Study  Result Date: 08/05/2017 CLINICAL DATA:  History of dementia.  Abnormal behavior. EXAM: CT ABDOMEN AND PELVIS WITHOUT CONTRAST TECHNIQUE: Multidetector CT imaging of the abdomen and pelvis was performed following the standard protocol without IV contrast. COMPARISON:  None. FINDINGS: Lower chest: Enlarged heart. Small pericardial effusion. Calcific atherosclerotic disease of the coronary arteries and aorta. Hepatobiliary: No focal liver abnormality is seen. No gallstones, gallbladder wall thickening, or biliary dilatation. Pancreas: Unremarkable. No pancreatic ductal dilatation or surrounding inflammatory changes. Spleen: Scattered splenic granuloma. Adrenals/Urinary Tract: No evidence of adrenal masses. Moderate bilateral hydronephrosis and hydroureter to the level of the vesicoureteral junction. No obstructing radiopaque stones are seen. Bilateral perirenal fat stranding. The urinary bladder is collapsed around urinary Foley, which abuts the superior wall of the bladder. High  density material is present within the urinary bladder. Stomach/Bowel: Stomach is within normal limits. No evidence of bowel wall thickening, distention, or inflammatory changes. Scattered colonic diverticulosis without evidence of diverticulitis. Vascular/Lymphatic: Aortic atherosclerosis. No enlarged abdominal or pelvic lymph nodes. Reproductive: Evaluation of the prostate gland is obscured by beam hardening artifact from bilateral hip prosthesis. Other: No abdominal wall hernia or abnormality. No abdominopelvic ascites. Musculoskeletal: No acute findings. Multilevel osteoarthritic changes. Bilateral hip prosthesis with intact hardware. IMPRESSION: Right greater than left hydronephrosis and mild hydroureter to the level of the vesicoureteral junction without evidence of radiopaque calculi. Bilateral perirenal inflammatory fat stranding. Urinary bladder collapsed around urinary Foley, which abuts the superior wall of the bladder. High density material within the urinary bladder may represent blood products. Scattered colonic diverticulosis. Enlarged heart with small pericardial effusion. Calcific atherosclerotic disease of the coronary arteries and aorta. Electronically Signed   By: Fidela Salisbury M.D.   On: 08/05/2017 18:06    ------  Assessment:  Patient is a 82 y.o. male with hx Parksinons, bipolar disorder, neurogenic bladder managed with indwelling chronic foley catheter. He was admitted with altered mental status and leukocytosis, concerning for a UTI. Since being started on vanc/meropenem, his mental status has improved and leukocytosis has as well.   CT abdomen pelvis non contrast demonstrated bilateral hydroureteronephrosis to the level of the bladder. Bladder appears decompressed. Perirenal stranding present bilaterally, consistent with leading diagnosis of pyelonephritis. Creatinine 1.18 today, at patient's baseline.   Given bladder appears decompressed on CT, patient's  hydroureteronephrosis is likely nonobstructive, secondary to patient's infection. This is further corroborated by the patient's normal Cr and improving clinical symptoms on broad spectrum antibiotics. No further intervention required acutely for this.   Recommendations: 1. Continue foley catheter to drainage 2. No intervention required for bilateral hydronephrosis, likely  due to pyelonephritis 3. Treatment of pyelonephritis per primary team  4. F/u in urology clinic in 1 month after treatment of UTI with repeat imaging to ensure resolution of hydronephrosis.  5. Urine culture should only be sent if patient displays obvious metnal status changes, leukocytosis, or fevers. Otherwise, UA/urine culture will always be positive for colonization given chronic catheter.   Thank you for this consult. Please contact the urology consult pager with any further questions/concerns.  Jonna Clark, MD Urology Surgical Resident  I have reviewed the above note and spoken with Dr. Hermenia Bers regarding the above patient.  I agree with the note, assessment and plan.
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
## 2017-08-07 ENCOUNTER — Encounter (HOSPITAL_COMMUNITY): Payer: Self-pay

## 2017-08-07 LAB — BASIC METABOLIC PANEL
Anion gap: 6 (ref 5–15)
BUN: 33 mg/dL — AB (ref 6–20)
CALCIUM: 8.4 mg/dL — AB (ref 8.9–10.3)
CHLORIDE: 110 mmol/L (ref 101–111)
CO2: 25 mmol/L (ref 22–32)
CREATININE: 1.25 mg/dL — AB (ref 0.61–1.24)
GFR calc Af Amer: >60 mL/min (ref >60)
GFR calc non Af Amer: 53 mL/min — ABNORMAL LOW (ref >60)
GLUCOSE: 88 mg/dL (ref 65–99)
Potassium: 4.2 mmol/L (ref 3.5–5.1)
Sodium: 141 mmol/L (ref 135–145)

## 2017-08-07 LAB — CBC WITH DIFFERENTIAL/PLATELET
BASOS PCT: 0 %
Basophils Absolute: 0 10*3/uL (ref 0.0–0.1)
Eosinophils Absolute: 0.4 10*3/uL (ref 0.0–0.7)
Eosinophils Relative: 4 %
HEMATOCRIT: 29.1 % — AB (ref 39.0–52.0)
HEMOGLOBIN: 9.8 g/dL — AB (ref 13.0–17.0)
LYMPHS ABS: 1.5 10*3/uL (ref 0.7–4.0)
LYMPHS PCT: 15 %
MCH: 31 pg (ref 26.0–34.0)
MCHC: 33.7 g/dL (ref 30.0–36.0)
MCV: 92.1 fL (ref 78.0–100.0)
MONO ABS: 0.6 10*3/uL (ref 0.1–1.0)
MONOS PCT: 6 %
NEUTROS ABS: 7.5 10*3/uL (ref 1.7–7.7)
Neutrophils Relative %: 75 %
Platelets: 221 10*3/uL (ref 150–400)
RBC: 3.16 MIL/uL — ABNORMAL LOW (ref 4.22–5.81)
RDW: 15.1 % (ref 11.5–15.5)
WBC: 9.9 10*3/uL (ref 4.0–10.5)

## 2017-08-07 LAB — BLOOD CULTURE ID PANEL (REFLEXED)
Acinetobacter baumannii: NOT DETECTED
CANDIDA GLABRATA: NOT DETECTED
CANDIDA KRUSEI: NOT DETECTED
CANDIDA PARAPSILOSIS: NOT DETECTED
CANDIDA TROPICALIS: NOT DETECTED
Candida albicans: NOT DETECTED
Carbapenem resistance: NOT DETECTED
ESCHERICHIA COLI: NOT DETECTED
Enterobacter cloacae complex: NOT DETECTED
Enterobacteriaceae species: NOT DETECTED
Enterococcus species: NOT DETECTED
Haemophilus influenzae: NOT DETECTED
KLEBSIELLA OXYTOCA: NOT DETECTED
KLEBSIELLA PNEUMONIAE: NOT DETECTED
LISTERIA MONOCYTOGENES: NOT DETECTED
Methicillin resistance: DETECTED — AB
Neisseria meningitidis: NOT DETECTED
PROTEUS SPECIES: NOT DETECTED
Pseudomonas aeruginosa: NOT DETECTED
SERRATIA MARCESCENS: NOT DETECTED
STREPTOCOCCUS SPECIES: NOT DETECTED
Staphylococcus aureus (BCID): NOT DETECTED
Staphylococcus species: DETECTED — AB
Streptococcus agalactiae: NOT DETECTED
Streptococcus pneumoniae: NOT DETECTED
Streptococcus pyogenes: NOT DETECTED
Vancomycin resistance: NOT DETECTED

## 2017-08-07 LAB — GLUCOSE, CAPILLARY
GLUCOSE-CAPILLARY: 110 mg/dL — AB (ref 65–99)
GLUCOSE-CAPILLARY: 114 mg/dL — AB (ref 65–99)
GLUCOSE-CAPILLARY: 119 mg/dL — AB (ref 65–99)
GLUCOSE-CAPILLARY: 129 mg/dL — AB (ref 65–99)

## 2017-08-07 NOTE — Progress Notes (Signed)
PHARMACY - PHYSICIAN COMMUNICATION CRITICAL VALUE ALERT - BLOOD CULTURE IDENTIFICATION (BCID)  Results for orders placed or performed during the hospital encounter of 08/05/17  Blood Culture ID Panel (Reflexed) (Collected: 08/05/2017  7:43 PM)  Result Value Ref Range   Enterococcus species NOT DETECTED NOT DETECTED   Vancomycin resistance NOT DETECTED NOT DETECTED   Listeria monocytogenes NOT DETECTED NOT DETECTED   Staphylococcus species DETECTED (A) NOT DETECTED   Staphylococcus aureus NOT DETECTED NOT DETECTED   Methicillin resistance DETECTED (A) NOT DETECTED   Streptococcus species NOT DETECTED NOT DETECTED   Streptococcus agalactiae NOT DETECTED NOT DETECTED   Streptococcus pneumoniae NOT DETECTED NOT DETECTED   Streptococcus pyogenes NOT DETECTED NOT DETECTED   Acinetobacter baumannii NOT DETECTED NOT DETECTED   Enterobacteriaceae species NOT DETECTED NOT DETECTED   Enterobacter cloacae complex NOT DETECTED NOT DETECTED   Escherichia coli NOT DETECTED NOT DETECTED   Klebsiella oxytoca NOT DETECTED NOT DETECTED   Klebsiella pneumoniae NOT DETECTED NOT DETECTED   Proteus species NOT DETECTED NOT DETECTED   Serratia marcescens NOT DETECTED NOT DETECTED   Carbapenem resistance NOT DETECTED NOT DETECTED   Haemophilus influenzae NOT DETECTED NOT DETECTED   Neisseria meningitidis NOT DETECTED NOT DETECTED   Pseudomonas aeruginosa NOT DETECTED NOT DETECTED   Candida albicans NOT DETECTED NOT DETECTED   Candida glabrata NOT DETECTED NOT DETECTED   Candida krusei NOT DETECTED NOT DETECTED   Candida parapsilosis NOT DETECTED NOT DETECTED   Candida tropicalis NOT DETECTED NOT DETECTED    Name of physician (or Provider) Contacted: Dr. Maylene Roes  Changes to prescribed antibiotics required: none  Hershal Coria 08/07/2017  7:34 AM
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
## 2017-08-07 NOTE — Care Management Note (Signed)
Case Management Note  Patient Details  Name: Freddrick Gladson. MRN: [id number] Date of Birth: Feb 08, 1935  Subjective/Objective: Noted PT recc SNF. CSW already following for return back to ALF.                  Action/Plan:d/c plan ALF   Expected Discharge Date:                  Expected Discharge Plan:  Assisted Living / Rest Home  In-House Referral:  Clinical Social Work  Discharge planning Services  CM Consult  Post Acute Care Choice:    Choice offered to:     DME Arranged:    DME Agency:     HH Arranged:    HH Agency:     Status of Service:  In process, will continue to follow  If discussed at Long Length of Stay Meetings, dates discussed:    Additional Comments:  Dessa Phi, RN 08/07/2017, 1:45 PM

## 2017-08-08 ENCOUNTER — Encounter: Payer: Self-pay | Admitting: Family Medicine

## 2017-08-08 DIAGNOSIS — D72829 Elevated white blood cell count, unspecified: Secondary | ICD-10-CM

## 2017-08-08 DIAGNOSIS — F319 Bipolar disorder, unspecified: Secondary | ICD-10-CM

## 2017-08-08 DIAGNOSIS — R319 Hematuria, unspecified: Secondary | ICD-10-CM

## 2017-08-08 DIAGNOSIS — F0281 Dementia in other diseases classified elsewhere with behavioral disturbance: Secondary | ICD-10-CM

## 2017-08-08 LAB — BASIC METABOLIC PANEL
ANION GAP: 5 (ref 5–15)
BUN: 30 mg/dL — ABNORMAL HIGH (ref 6–20)
CO2: 27 mmol/L (ref 22–32)
Calcium: 8.5 mg/dL — ABNORMAL LOW (ref 8.9–10.3)
Chloride: 109 mmol/L (ref 101–111)
Creatinine, Ser: 1.05 mg/dL (ref 0.61–1.24)
GLUCOSE: 105 mg/dL — AB (ref 65–99)
POTASSIUM: 4.4 mmol/L (ref 3.5–5.1)
Sodium: 141 mmol/L (ref 135–145)

## 2017-08-08 LAB — GLUCOSE, CAPILLARY
GLUCOSE-CAPILLARY: 92 mg/dL (ref 65–99)
Glucose-Capillary: 123 mg/dL — ABNORMAL HIGH (ref 65–99)
Glucose-Capillary: 159 mg/dL — ABNORMAL HIGH (ref 65–99)

## 2017-08-08 LAB — CBC WITH DIFFERENTIAL/PLATELET
BASOS ABS: 0 10*3/uL (ref 0.0–0.1)
BASOS PCT: 0 %
EOS PCT: 4 %
Eosinophils Absolute: 0.4 10*3/uL (ref 0.0–0.7)
HCT: 30.1 % — ABNORMAL LOW (ref 39.0–52.0)
Hemoglobin: 9.9 g/dL — ABNORMAL LOW (ref 13.0–17.0)
LYMPHS PCT: 14 %
Lymphs Abs: 1.2 10*3/uL (ref 0.7–4.0)
MCH: 30.7 pg (ref 26.0–34.0)
MCHC: 32.9 g/dL (ref 30.0–36.0)
MCV: 93.5 fL (ref 78.0–100.0)
MONO ABS: 0.6 10*3/uL (ref 0.1–1.0)
Monocytes Relative: 6 %
NEUTROS ABS: 6.7 10*3/uL (ref 1.7–7.7)
Neutrophils Relative %: 76 %
PLATELETS: 226 10*3/uL (ref 150–400)
RBC: 3.22 MIL/uL — AB (ref 4.22–5.81)
RDW: 15 % (ref 11.5–15.5)
WBC: 8.9 10*3/uL (ref 4.0–10.5)

## 2017-08-08 LAB — URINE CULTURE

## 2017-08-08 MED ORDER — CEFPODOXIME PROXETIL 200 MG PO TABS
200.0000 mg | ORAL_TABLET | Freq: Two times a day (BID) | ORAL | Status: DC
  Administered 2017-08-08: 200 mg via ORAL
  Filled 2017-08-08 (×2): qty 1

## 2017-08-08 MED ORDER — CEFPODOXIME PROXETIL 200 MG PO TABS: 200.0000 mg | ORAL_TABLET | Freq: Two times a day (BID) | ORAL | 0 refills | Status: AC

## 2017-08-08 MED ORDER — MUPIROCIN 2 % EX OINT: 1.0000 "application " | TOPICAL_OINTMENT | Freq: Two times a day (BID) | CUTANEOUS | Status: DC

## 2017-08-08 MED ORDER — CLONAZEPAM 0.5 MG PO TABS: 0.2500 mg | ORAL_TABLET | Freq: Two times a day (BID) | ORAL | 0 refills | Status: AC | PRN

## 2017-08-08 MED ORDER — MUPIROCIN 2 % EX OINT: 1.0000 "application " | TOPICAL_OINTMENT | Freq: Two times a day (BID) | CUTANEOUS | 0 refills | Status: DC

## 2017-08-08 MED ORDER — CHLORHEXIDINE GLUCONATE CLOTH 2 % EX PADS: 6.0000 | MEDICATED_PAD | Freq: Every day | CUTANEOUS | Status: DC

## 2017-08-08 NOTE — Progress Notes (Signed)
Unable to administer patient's AM IV abx d/t loss of IV access.  Ok to leave IV out per Dr. Lonny Prude; abx have been switched to PO and plan is for patient to discharge later today.  Will continue to monitor.

## 2017-08-08 NOTE — Progress Notes (Signed)
Report given to [facility] at [facility].

## 2017-08-08 NOTE — Progress Notes (Signed)
Pt is ready to return to [facility]. DC Summary / FL2 sent to facility for review. Scripts included in [employer]. # for report provided to nsg. POA, Gloria Heuskin, contacted and in agreement with dc plan.   Werner Lean LCSW [phone]

## 2017-08-08 NOTE — NC FL2 (Signed)
[place] LEVEL OF CARE SCREENING TOOL     IDENTIFICATION  Patient Name: [name]. Birthdate: August 20, 1935 Sex: male Admission Date (Current Location): 08/05/2017  [facility] and [place] Number:  Herbalist and Address:  [facility],  [place], [place]      Provider Number: [id number]  Attending Physician Name and Address:  Mariel Aloe, MD  Relative Name and Phone Number:       Current Level of Care: Hospital Recommended Level of Care: [place] Prior Approval Number:    Date Approved/Denied:   PASRR Number: [id number]  Discharge Plan: Other (Comment) (ALF)    Current Diagnoses: Patient Active Problem List   Diagnosis Date Noted  . Dementia due to Parkinson's disease with behavioral disturbance ([place]) 08/06/2017  . Pyelonephritis 08/06/2017  . Contusion of head 08/06/2017  . Pressure injury of skin 08/06/2017  . Complicated UTI (urinary tract infection) 08/05/2017  . CAP (community acquired pneumonia) 11/29/2016  . Bacteremia 03/14/2016  . HCAP (healthcare-associated pneumonia)   . Pressure ulcer 10/01/2015  . Right lower lobe pneumonia ([place]) 10/01/2015  . Sepsis due to Gram-negative organism with acute respiratory failure ([place]) 10/01/2015  . Acute renal failure ([place]) 10/01/2015  . Acute respiratory failure with hypoxia ([place]) 09/30/2015  . Influenza A (H1N1) 03/25/2015  . Dementia without behavioral disturbance 03/25/2015  . Hyperkalemia 03/25/2015  . Falls 03/16/2015  . Sacral pressure ulcer 03/16/2015  . Fever 03/16/2015  . Memory difficulty 08/21/2014  . Malnutrition of moderate degree ([place]) 04/19/2014  . UTI (lower urinary tract infection) 04/18/2014  . Hypoxia 04/18/2014  . PNA (pneumonia) 04/18/2014  . Chronic diastolic congestive heart failure ([place]) 02/01/2013  . Pericardial effusion 02/01/2013  . Bradycardia 01/31/2013  . Empyema lung ([place]) 12/23/2012  . S/P thoracotomy  12/23/2012  . Abnormality of gait 12/05/2012  . Hypernatremia 08/13/2012  . Hypertension   . Diabetes mellitus, type 2 ([place])   . Arthritis   . Bipolar disorder ([place])   . Anxiety   . Chronic indwelling Foley catheter   . Neuromuscular disorder ([place])   . Bacteremia of undetermined etiology 06/17/2012  . Leukocytosis 04/17/2012  . Parkinson disease ([place]) 04/16/2012  . Elevated troponin 04/16/2012  . Hearing aid worn   . Diabetes mellitus ([place]) 07/06/2011    Orientation RESPIRATION BLADDER Height & Weight     Self  Normal Indwelling catheter Weight: 73.9 kg (162 lb 14.4 oz) Height:  5\' 10"  (177.8 cm)  BEHAVIORAL SYMPTOMS/MOOD NEUROLOGICAL BOWEL NUTRITION STATUS      Incontinent Diet (Regular)  AMBULATORY STATUS COMMUNICATION OF NEEDS Skin   Extensive Assist Verbally Other (Comment) (Stage 3 on coccyx)                       Personal Care Assistance Level of Assistance  Bathing, Feeding, Dressing Bathing Assistance: Maximum assistance Feeding assistance: Limited assistance Dressing Assistance: Maximum assistance     Functional Limitations Info  Sight, Hearing, Speech Sight Info: Adequate Hearing Info: Adequate Speech Info: Adequate    SPECIAL CARE FACTORS FREQUENCY  PT (By licensed PT)     PT Frequency: 3x wk OT Frequency: 3x wk            Contractures Contractures Info: Not present    Additional Factors Info  Code Status, Allergies, Isolation Precautions Code Status Info: DNR Allergies Info: PLANT STEROLS AND STANOLS     Isolation Precautions Info: 08/05/17 MRSA PCR+- no  encounter     Current Medications (08/08/2017):  This is the current hospital active medication list Current Facility-Administered Medications  Medication Dose Route Frequency Provider Last Rate Last Dose  . acetaminophen (TYLENOL) tablet 650 mg  650 mg Oral Q6H PRN Norval Morton, MD       Or  . acetaminophen (TYLENOL) suppository 650 mg  650 mg Rectal Q6H PRN Smith, Rondell A, MD       . albuterol (PROVENTIL) (2.5 MG/3ML) 0.083% nebulizer solution 2.5 mg  2.5 mg Nebulization Q4H PRN Tamala Julian, Rondell A, MD      . bisacodyl (DULCOLAX) suppository 10 mg  10 mg Rectal PRN Fuller Plan A, MD      . carbidopa-levodopa (SINEMET IR) 25-250 MG per tablet immediate release 1 tablet  1 tablet Oral TID WC Fuller Plan A, MD   1 tablet at 08/08/17 1008  . cefpodoxime (VANTIN) tablet 200 mg  200 mg Oral Q12H Mariel Aloe, MD      . clonazePAM Bobbye [place]) tablet 0.25 mg  0.25 mg Oral BID PRN Fuller Plan A, MD      . entacapone (COMTAN) tablet 200 mg  200 mg Oral TID WC Smith, Rondell A, MD   200 mg at 08/08/17 1009  . feeding supplement (ENSURE ENLIVE) (ENSURE ENLIVE) liquid 237 mL  237 mL Oral BID BM Smith, Rondell A, MD   237 mL at 08/08/17 1000  . ferrous sulfate tablet 325 mg  325 mg Oral BID Fuller Plan A, MD   325 mg at 08/08/17 1008  . guaiFENesin (ROBITUSSIN) 100 MG/5ML solution 200 mg  200 mg Oral Q4H PRN Smith, Rondell A, MD      . hydrALAZINE (APRESOLINE) tablet 25 mg  25 mg Oral BID Tamala Julian, Rondell A, MD   25 mg at 08/08/17 1008  . hydrocerin (EUCERIN) cream 1 application  1 application Topical BID Norval Morton, MD   1 application at 98/33/82 1009  . insulin aspart (novoLOG) injection 0-9 Units  0-9 Units Subcutaneous TID WC Dessa Phi Chahn-Yang, DO   1 Units at 08/07/17 1700  . isosorbide mononitrate (IMDUR) 24 hr tablet 30 mg  30 mg Oral Daily Smith, Rondell A, MD   30 mg at 08/08/17 1009  . ketoconazole (NIZORAL) 2 % cream 1 application  1 application Topical Daily PRN Smith, Rondell A, MD      . lamoTRIgine (LAMICTAL) tablet 150 mg  150 mg Oral BID Tamala Julian, Rondell A, MD   150 mg at 08/08/17 1008  . lisinopril (PRINIVIL,ZESTRIL) tablet 5 mg  5 mg Oral Daily Dessa Phi Chahn-Yang, DO   5 mg at 08/08/17 1008  . liver oil-zinc oxide (DESITIN) 40 % ointment   Topical PRN Schorr, Rhetta Mura, NP   57 application at 50/53/97 2127  . ondansetron (ZOFRAN) tablet  4 mg  4 mg Oral Q6H PRN Fuller Plan A, MD       Or  . ondansetron (ZOFRAN) injection 4 mg  4 mg Intravenous Q6H PRN Smith, Rondell A, MD      . pantoprazole (PROTONIX) EC tablet 40 mg  40 mg Oral Daily Smith, Rondell A, MD   40 mg at 08/08/17 1008  . polyethylene glycol (MIRALAX / GLYCOLAX) packet 17 g  17 g Oral Daily Smith, Rondell A, MD   17 g at 08/08/17 1009  . predniSONE (DELTASONE) tablet 2.5 mg  2.5 mg Oral Q breakfast Tamala Julian, Rondell A, MD   2.5 mg at 08/08/17 1009  .  QUEtiapine (SEROQUEL) tablet 75 mg  75 mg Oral QHS Smith, Rondell A, MD   75 mg at 08/07/17 2025  . rosuvastatin (CRESTOR) tablet 20 mg  20 mg Oral Daily Tamala Julian, Rondell A, MD   20 mg at 08/08/17 1008  . sertraline (ZOLOFT) tablet 200 mg  200 mg Oral Daily Tamala Julian, Rondell A, MD   200 mg at 08/08/17 1008  . traZODone (DESYREL) tablet 50 mg  50 mg Oral QHS Fuller Plan A, MD   50 mg at 08/07/17 2025     Discharge Medications: Please see discharge summary for a list of discharge medications. Medication List             STOP taking these medications            cephALEXin 500 MG capsule Commonly known as:  KEFLEX    phenazopyridine 200 MG tablet Commonly known as:  PYRIDIUM                       TAKE these medications            acetaminophen 325 MG tablet Commonly known as:  TYLENOL Take 650 mg by mouth every 6 (six) hours as needed for mild pain.    aspirin 81 MG chewable tablet Chew 81 mg by mouth daily.    BAZA PROTECT EX Apply topically 2 (two) times daily.    belladonna-opium 16.2-30 MG suppository Commonly known as:  B&O SUPPRETTES Place 1 suppository rectally every 8 (eight) hours as needed for pain.    bisacodyl 10 MG suppository Commonly known as:  DULCOLAX Place 10 mg rectally as needed for moderate constipation.    carbidopa-levodopa 25-250 MG tablet Commonly known as:  SINEMET IR Take 1 tablet by mouth 3 (three) times daily.    cefpodoxime 200 MG tablet Commonly known  as:  VANTIN Take 1 tablet (200 mg total) by mouth 2 (two) times daily.    clonazePAM 0.5 MG tablet Commonly known as:  KLONOPIN Take 0.5 tablets (0.25 mg total) by mouth 2 (two) times daily as needed (anxiety). What changed:  when to take this  reasons to take this    clopidogrel 75 MG tablet Commonly known as:  PLAVIX Take 1 tablet (75 mg total) by mouth daily. What changed:  when to take this    entacapone 200 MG tablet Commonly known as:  COMTAN Take 200 mg by mouth 3 (three) times daily.    feeding supplement (GLUCERNA SHAKE) Liqd Take 237 mLs by mouth 2 (two) times daily between meals.    ferrous sulfate 325 (65 FE) MG tablet Take 325 mg by mouth 2 (two) times daily.    guaifenesin 100 MG/5ML syrup Commonly known as:  ROBITUSSIN Take 200 mg by mouth every 4 (four) hours as needed for cough.    hydrALAZINE 25 MG tablet Commonly known as:  APRESOLINE Take 25 mg by mouth 2 (two) times daily.    isosorbide mononitrate 30 MG 24 hr tablet Commonly known as:  IMDUR Take 1 tablet (30 mg total) by mouth daily.    ketoconazole 2 % cream Commonly known as:  NIZORAL Apply 1 application topically daily as needed (fungal infection).    lamoTRIgine 150 MG tablet Commonly known as:  LAMICTAL Take 1 tablet (150 mg total) by mouth 2 (two) times daily.    lisinopril 10 MG tablet Commonly known as:  PRINIVIL,ZESTRIL Take 0.5 tablets (5 mg total) by mouth daily.    multivitamin  with minerals Tabs tablet Take 1 tablet by mouth daily.    pantoprazole 40 MG tablet Commonly known as:  PROTONIX Take 40 mg by mouth daily. Reported on 07/06/2016    polyethylene glycol packet Commonly known as:  MIRALAX / GLYCOLAX Take 17 g by mouth daily.    predniSONE 2.5 MG tablet Commonly known as:  DELTASONE Take 2.5 mg by mouth daily with breakfast.    QUEtiapine 25 MG tablet Commonly known as:  SEROQUEL Take 3 tablets (75 mg total) by mouth at bedtime.     rosuvastatin 20 MG tablet Commonly known as:  CRESTOR Take 20 mg by mouth daily.    sertraline 100 MG tablet Commonly known as:  ZOLOFT Take 2 tablets (200 mg total) by mouth daily.    traZODone 50 MG tablet Commonly known as:  DESYREL Take 1 tablet (50 mg total) by mouth at bedtime.                                            Relevant Imaging Results:  Relevant Lab Results:   Additional Information SSN [id number]  Draven Laine, Randall An, LCSW

## 2017-08-08 NOTE — H&P (Deleted)
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 None (Preliminary result)   Collection Time: 08/07/17  8:08 AM  Result Value Ref Range Status   Specimen Description BLOOD RIGHT HAND  Final   Special Requests IN PEDIATRIC BOTTLE Blood Culture adequate volume  Final   Culture   Final    NO GROWTH < 24 HOURS Performed at [facility], [place] [address] [place], [place] [address]    Report Status PENDING  Incomplete  Culture, blood (routine x 2)     Status: None (Preliminary result)   Collection Time: 08/07/17  9:33 AM  Result Value Ref Range Status   Specimen Description BLOOD RIGHT HAND  Final   Special Requests IN PEDIATRIC BOTTLE Blood Culture adequate volume  Final   Culture   Final    NO GROWTH < 24 HOURS Performed at [facility], [place] [address] [place], [place] [address]    Report Status PENDING  Incomplete     Labs: BNP (last 3 results) No  results for input(s): BNP in the last 8760 hours. Basic Metabolic Panel:  Recent Labs Lab 08/05/17 1600 08/06/17 0507 08/07/17 0529 08/08/17 0610  NA 137 139 141 141  K 5.3* 4.0 4.2 4.4  CL 103 108 110 109  CO2 24 22 25 27   GLUCOSE 156* 126* 88 105*  BUN 31* 28* 33* 30*  CREATININE 1.28* 1.18 1.25* 1.05  CALCIUM 9.3 8.5* 8.4* 8.5*   Liver Function Tests: No results for input(s): AST, ALT, ALKPHOS, BILITOT, PROT, ALBUMIN in the last 168 hours. No results for input(s): LIPASE, AMYLASE in the last 168 hours. No results for input(s): AMMONIA in the last 168 hours. CBC:  Recent Labs Lab 08/05/17 1600 08/06/17 0507 08/07/17 0529 08/08/17 0610  WBC 29.0* 14.1* 9.9 8.9  NEUTROABS 27.2*  --  7.5 6.7  HGB 13.5 10.1* 9.8* 9.9*  HCT 39.5 30.3* 29.1* 30.1*  MCV 90.6 90.2 92.1 93.5  PLT 289 202 221 226   Cardiac Enzymes: No results for input(s): CKTOTAL, CKMB, CKMBINDEX, TROPONINI in the last 168 hours. BNP: Invalid input(s): POCBNP CBG:  Recent Labs Lab 08/07/17 1200 08/07/17 1721 08/07/17 2334 08/08/17 0821 08/08/17 1222  GLUCAP 119* 129* 114* 92 123*   D-Dimer No results for input(s): DDIMER in the last 72 hours. Hgb A1c No results for input(s): HGBA1C in the last 72 hours. Lipid Profile No results for input(s): CHOL, HDL, LDLCALC, TRIG, CHOLHDL, LDLDIRECT in the last 72 hours. Thyroid function studies No results for input(s): TSH, T4TOTAL, T3FREE, THYROIDAB in the last 72 hours.  Invalid input(s): FREET3 Anemia work up No results for input(s): VITAMINB12, FOLATE, FERRITIN, TIBC, IRON, RETICCTPCT in the last 72 hours. Urinalysis    Component Value Date/Time   COLORURINE BROWN (A) 08/05/2017 1600   APPEARANCEUR CLOUDY (A) 08/05/2017 1600   LABSPEC 1.009 08/05/2017 1600   PHURINE 6.0 08/05/2017 1600   GLUCOSEU NEGATIVE 08/05/2017 1600   HGBUR LARGE (A) 08/05/2017 1600   BILIRUBINUR NEGATIVE 08/05/2017 1600   KETONESUR NEGATIVE 08/05/2017 1600   PROTEINUR  30 (A) 08/05/2017 1600   UROBILINOGEN 0.2 10/05/2015 1555   NITRITE NEGATIVE 08/05/2017 1600   LEUKOCYTESUR LARGE (A) 08/05/2017 1600   Sepsis Labs Invalid input(s): PROCALCITONIN,  WBC,  LACTICIDVEN Microbiology Recent Results (from the past 240 hour(s))  Blood culture (routine x 2)     Status: None (Preliminary result)   Collection Time: 08/05/17  6:06 PM  Result Value Ref Range Status   Specimen Description BLOOD LEFT ANTECUBITAL  Final   Special Requests   Final    BOTTLES DRAWN AEROBIC AND ANAEROBIC Blood Culture adequate volume   Culture   Final    NO GROWTH 2 DAYS Performed at [facility], [place] [address], [place], [place] [address]    Report Status PENDING  Incomplete  Blood culture (routine x 2)     Status: None (Preliminary result)   Collection Time: 08/05/17  7:43 PM  Result Value Ref Range Status   Specimen Description BLOOD LEFT HAND  Final   Special Requests   Final    BOTTLES DRAWN AEROBIC AND ANAEROBIC Blood Culture adequate volume   Culture  Setup Time   Final    GRAM POSITIVE COCCI IN CLUSTERS AEROBIC BOTTLE ONLY CRITICAL RESULT CALLED TO, READ BACK BY AND VERIFIED WITH: [place], [place] 08/07/17 0652 L.CHAMPION  Performed at [facility], [place] [address] [place], [place] [address]    Culture GRAM POSITIVE COCCI IN CLUSTERS  Final   Report Status PENDING  Incomplete  Blood Culture ID Panel (Reflexed)     Status: Abnormal   Collection Time: 08/05/17  7:43 PM  Result Value Ref Range Status   Enterococcus species NOT DETECTED NOT DETECTED Final   Vancomycin resistance NOT DETECTED NOT DETECTED Final   Listeria monocytogenes NOT DETECTED NOT DETECTED Final   Staphylococcus species DETECTED (A) NOT DETECTED Final    Comment: CRITICAL RESULT CALLED TO, READ BACK BY AND VERIFIED WITH: [place], PHARMD 08/07/17 0652 L.CHAMPION    Staphylococcus aureus NOT DETECTED NOT DETECTED Final   Methicillin resistance DETECTED (A) NOT DETECTED Final     Comment: CRITICAL RESULT CALLED TO, READ BACK BY AND VERIFIED WITH: [place], PHARMD 08/07/17 0652 L.CHAMPION    Streptococcus species NOT DETECTED NOT DETECTED Final   Streptococcus agalactiae NOT DETECTED NOT DETECTED Final   Streptococcus pneumoniae NOT DETECTED NOT DETECTED Final   Streptococcus pyogenes NOT DETECTED NOT DETECTED Final   Acinetobacter baumannii NOT DETECTED NOT DETECTED Final   Enterobacteriaceae species NOT DETECTED NOT DETECTED Final   Enterobacter cloacae complex NOT DETECTED NOT DETECTED Final   Escherichia coli NOT DETECTED NOT DETECTED Final   Klebsiella oxytoca NOT DETECTED NOT DETECTED Final   Klebsiella pneumoniae NOT DETECTED NOT DETECTED Final   Proteus species NOT DETECTED NOT DETECTED Final   Serratia marcescens NOT DETECTED NOT DETECTED Final   Carbapenem resistance NOT DETECTED NOT DETECTED Final   Haemophilus influenzae NOT DETECTED NOT DETECTED Final   Neisseria meningitidis NOT DETECTED NOT DETECTED Final   Pseudomonas aeruginosa NOT DETECTED NOT DETECTED Final   Candida albicans NOT DETECTED NOT DETECTED Final   Candida glabrata NOT DETECTED NOT DETECTED Final   Candida krusei NOT DETECTED NOT DETECTED Final   Candida parapsilosis NOT DETECTED NOT DETECTED Final   Candida tropicalis NOT DETECTED NOT DETECTED Final    Comment: Performed at [facility], [place] [address] [place], [place] [address]  Urine Culture     Status: Abnormal   Collection Time: 08/05/17  7:45 PM  Result Value Ref Range Status   Specimen Description URINE, CATHETERIZED  Final   Special Requests NONE  Final   Culture MULTIPLE SPECIES PRESENT, SUGGEST RECOLLECTION (A)  Final   Report Status 08/08/2017 FINAL  Final  MRSA PCR Screening     Status: Abnormal   Collection Time: 08/05/17  8:54 PM  Result Value Ref Range Status   MRSA by PCR POSITIVE (A) NEGATIVE Final    Comment:        The GeneXpert MRSA Assay (FDA approved for NASAL specimens only), is one component  of a comprehensive MRSA colonization surveillance program. It is not intended to diagnose MRSA infection nor to guide or monitor treatment for MRSA infections. RESULT CALLED TO, READ BACK BY AND VERIFIED WITH: L HATFIELD AT 2343 ON 08.19.2018 BY NBROOKS   Culture, blood (routine x 2)     Status: None (Preliminary result)   Collection Time: 08/07/17  8:08 AM  Result Value Ref Range Status   Specimen Description BLOOD RIGHT HAND  Final   Special Requests IN PEDIATRIC BOTTLE Blood Culture adequate volume  Final   Culture   Final    NO GROWTH < 24 HOURS Performed at [facility], [place] [address], [place], [place] [address]    Report Status PENDING  Incomplete  Culture, blood (routine x 2)     Status: None (Preliminary result)   Collection Time: 08/07/17  9:33 AM  Result Value Ref Range Status   Specimen Description BLOOD RIGHT HAND  Final   Special Requests IN PEDIATRIC BOTTLE Blood Culture adequate volume  Final   Culture   Final    NO GROWTH < 24 HOURS Performed at [facility], [place] [address], [place], [place] [address]    Report Status PENDING  Incomplete     Time coordinating discharge: Over 30 minutes  SIGNED:   Cordelia Poche, MD [facility] 08/08/2017, 12:31 PM Pager [phone]  If 7PM-7AM, please contact night-coverage [email or web address] Password [email or web address]

## 2017-08-08 NOTE — Progress Notes (Signed)
Physical Therapy Treatment Patient Details Name: [name]. MRN: [id number] DOB: June 12, 1935 Today's Date: 08/08/2017    History of Present Illness 82 y.o. male with medical history significant of chronic indwelling Foley with recurrent UTIs, Parkinson's disease, bipolar disorder, and diabetes mellitus type 2; who was sent from his facility for reports of abnormal and aggressive behavior for the last few days.    PT Comments    Decreased assistance required for transfers today. Pt very pleasant and motivated.   Follow Up Recommendations  SNF (will need physical assist for transfers and ADLs)     Equipment Recommendations  None recommended by PT    Recommendations for Other Services       Precautions / Restrictions Precautions Precautions: Fall Restrictions Weight Bearing Restrictions: No    Mobility  Bed Mobility Overal bed mobility: Needs Assistance Bed Mobility: Rolling;Sidelying to Sit Rolling: Min assist Sidelying to sit: Min assist       General bed mobility comments: assist to initiate movement and to raise trunk/advance BLEs  Transfers Overall transfer level: Needs assistance Equipment used: None Transfers: Sit to/from [facility] Sit to Stand: Mod assist Stand pivot transfers: Min assist       General transfer comment: mod A to rise (pt 40%), min A for SPT (pt 60%); SPT x 2 (bed to 3 in 1, then to recliner)  Ambulation/Gait             General Gait Details: WC bound at baseline   Stairs            Wheelchair Mobility    Modified Rankin (Stroke Patients Only)       Balance Overall balance assessment: Needs assistance Sitting-balance support: Feet supported Sitting balance-Leahy Scale: Fair                                      Cognition Arousal/Alertness: Awake/alert Behavior During Therapy: WFL for tasks assessed/performed Overall Cognitive Status: No family/caregiver present to determine  baseline cognitive functioning                                 General Comments: pt oriented to location and self, repeats same question multiple times, can follow commands      Exercises      General Comments        Pertinent Vitals/Pain Pain Assessment: No/denies pain    Home Living                      Prior Function            PT Goals (current goals can now be found in the care plan section) Acute Rehab PT Goals PT Goal Formulation: Patient unable to participate in goal setting Time For Goal Achievement: 08/20/17 Potential to Achieve Goals: Fair Progress towards PT goals: Progressing toward goals    Frequency    Min 3X/week      PT Plan Current plan remains appropriate    Co-evaluation              AM-PAC PT "6 Clicks" Daily Activity  Outcome Measure  Difficulty turning over in bed (including adjusting bedclothes, sheets and blankets)?: Unable Difficulty moving from lying on back to sitting on the side of the bed? : Unable Difficulty sitting down on and standing up from  a chair with arms (e.g., wheelchair, bedside commode, etc,.)?: Unable Help needed moving to and from a bed to chair (including a wheelchair)?: A Lot Help needed walking in hospital room?: Total Help needed climbing 3-5 steps with a railing? : Total 6 Click Score: 7    End of Session Equipment Utilized During Treatment: Gait belt Activity Tolerance: Patient tolerated treatment well Patient left: in chair;with call bell/phone within reach;with chair alarm set Nurse Communication: Mobility status;Other (comment) (pt soiled from BM, needs cleanup) PT Visit Diagnosis: History of falling (Z91.81);Muscle weakness (generalized) (M62.81)     Time: 1102-1117 PT Time Calculation (min) (ACUTE ONLY): 16 min  Charges:  $Therapeutic Activity: 8-22 mins                    G Codes:          Philomena Doheny 08/08/2017, 12:10 PM  [phone]

## 2017-08-09 LAB — CULTURE, BLOOD (ROUTINE X 2): SPECIAL REQUESTS: ADEQUATE

## 2017-08-09 NOTE — Discharge Summary (Signed)
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 MULTIPLE SPECIES PRESENT, SUGGEST RECOLLECTION (A)  Final   Report Status 08/08/2017 FINAL  Final  MRSA PCR Screening     Status: Abnormal   Collection Time: 08/05/17  8:54 PM  Result Value Ref Range Status   MRSA by PCR POSITIVE (A) NEGATIVE Final    Comment:        The GeneXpert MRSA Assay (FDA approved for NASAL specimens only), is one component of a comprehensive MRSA colonization surveillance program. It is not intended to diagnose MRSA infection nor to guide or monitor treatment for MRSA infections. RESULT CALLED TO, READ BACK BY AND VERIFIED WITH: L HATFIELD AT 2343 ON 08.19.2018 BY NBROOKS   Culture, blood (routine x 2)     Status: None (Preliminary result)  Collection Time: 08/07/17  8:08 AM  Result Value Ref Range Status    Specimen Description BLOOD RIGHT HAND  Final   Special Requests IN PEDIATRIC BOTTLE Blood Culture adequate volume  Final   Culture   Final    NO GROWTH 2 DAYS Performed at [facility], [place] [address] [place], [place] [address]    Report Status PENDING  Incomplete  Culture, blood (routine x 2)     Status: None (Preliminary result)   Collection Time: 08/07/17  9:33 AM  Result Value Ref Range Status   Specimen Description BLOOD RIGHT HAND  Final   Special Requests IN PEDIATRIC BOTTLE Blood Culture adequate volume  Final   Culture   Final    NO GROWTH 2 DAYS Performed at [facility], [place] [address] [place], [place] [address]    Report Status PENDING  Incomplete     Labs: BNP (last 3 results) No results for input(s): BNP in the last 8760 hours. Basic Metabolic Panel:  Recent Labs Lab 08/05/17 1600 08/06/17 0507 08/07/17 0529 08/08/17 0610  NA 137 139 141 141  K 5.3* 4.0 4.2 4.4  CL 103 108 110 109  CO2 24 22 25 27   GLUCOSE 156* 126* 88 105*  BUN 31* 28* 33* 30*  CREATININE 1.28* 1.18 1.25* 1.05  CALCIUM 9.3 8.5* 8.4* 8.5*   Liver Function Tests: No results for input(s): AST, ALT, ALKPHOS, BILITOT, PROT, ALBUMIN in the last 168 hours. No results for input(s): LIPASE, AMYLASE in the last 168 hours. No results for input(s): AMMONIA in the last 168 hours. CBC:  Recent Labs Lab 08/05/17 1600 08/06/17 0507 08/07/17 0529 08/08/17 0610  WBC 29.0* 14.1* 9.9 8.9  NEUTROABS 27.2*  --  7.5 6.7  HGB 13.5 10.1* 9.8* 9.9*  HCT 39.5 30.3* 29.1* 30.1*  MCV 90.6 90.2 92.1 93.5  PLT 289 202 221 226   Cardiac Enzymes: No results for input(s): CKTOTAL, CKMB, CKMBINDEX, TROPONINI in the last 168 hours. BNP: Invalid input(s): POCBNP CBG:  Recent Labs Lab 08/07/17 1721 08/07/17 2334 08/08/17 0821 08/08/17 1222 08/08/17 1649  GLUCAP 129* 114* 92 123* 159*   D-Dimer No results for input(s): DDIMER in the last 72 hours. Hgb A1c No results for input(s):  HGBA1C in the last 72 hours. Lipid Profile No results for input(s): CHOL, HDL, LDLCALC, TRIG, CHOLHDL, LDLDIRECT in the last 72 hours. Thyroid function studies No results for input(s): TSH, T4TOTAL, T3FREE, THYROIDAB in the last 72 hours.  Invalid input(s): FREET3 Anemia work up No results for input(s): VITAMINB12, FOLATE, FERRITIN, TIBC, IRON, RETICCTPCT in the last 72 hours. Urinalysis    Component Value Date/Time   COLORURINE BROWN (A) 08/05/2017 1600   APPEARANCEUR CLOUDY (A) 08/05/2017 1600   LABSPEC 1.009 08/05/2017 1600   PHURINE 6.0 08/05/2017 1600   GLUCOSEU NEGATIVE 08/05/2017 1600   HGBUR LARGE (A) 08/05/2017 1600   BILIRUBINUR NEGATIVE 08/05/2017 1600   KETONESUR NEGATIVE 08/05/2017 1600   PROTEINUR 30 (A) 08/05/2017 1600   UROBILINOGEN 0.2 10/05/2015 1555   NITRITE NEGATIVE 08/05/2017 1600   LEUKOCYTESUR LARGE (A) 08/05/2017 1600   Sepsis Labs Invalid input(s): PROCALCITONIN,  WBC,  LACTICIDVEN Microbiology Recent Results (from the past 240 hour(s))  Blood culture (routine x 2)     Status: None (Preliminary result)   Collection Time: 08/05/17  6:06 PM  Result Value Ref Range Status   Specimen Description BLOOD LEFT ANTECUBITAL  Final   Special Requests   Final  BOTTLES DRAWN AEROBIC AND ANAEROBIC Blood Culture adequate volume   Culture   Final    NO GROWTH 3 DAYS Performed at [facility], [place] [address] [place], [place] [address]    Report Status PENDING  Incomplete  Blood culture (routine x 2)     Status: Abnormal   Collection Time: 08/05/17  7:43 PM  Result Value Ref Range Status   Specimen Description BLOOD LEFT HAND  Final   Special Requests   Final    BOTTLES DRAWN AEROBIC AND ANAEROBIC Blood Culture adequate volume   Culture  Setup Time   Final    GRAM POSITIVE COCCI IN CLUSTERS AEROBIC BOTTLE ONLY CRITICAL RESULT CALLED TO, READ BACK BY AND VERIFIED WITH: [place], [place] 08/07/17 0652 L.CHAMPION    Culture (A)  Final     STAPHYLOCOCCUS SPECIES (COAGULASE NEGATIVE) THE SIGNIFICANCE OF ISOLATING THIS ORGANISM FROM A SINGLE SET OF BLOOD CULTURES WHEN MULTIPLE SETS ARE DRAWN IS UNCERTAIN. PLEASE NOTIFY THE MICROBIOLOGY DEPARTMENT WITHIN ONE WEEK IF SPECIATION AND SENSITIVITIES ARE REQUIRED. Performed at [facility], [place] [address], [place], [place] [address]    Report Status 08/09/2017 FINAL  Final  Blood Culture ID Panel (Reflexed)     Status: Abnormal   Collection Time: 08/05/17  7:43 PM  Result Value Ref Range Status   Enterococcus species NOT DETECTED NOT DETECTED Final   Vancomycin resistance NOT DETECTED NOT DETECTED Final   Listeria monocytogenes NOT DETECTED NOT DETECTED Final   Staphylococcus species DETECTED (A) NOT DETECTED Final    Comment: CRITICAL RESULT CALLED TO, READ BACK BY AND VERIFIED WITH: [place], PHARMD 08/07/17 0652 L.CHAMPION    Staphylococcus aureus NOT DETECTED NOT DETECTED Final   Methicillin resistance DETECTED (A) NOT DETECTED Final    Comment: CRITICAL RESULT CALLED TO, READ BACK BY AND VERIFIED WITH: [place], PHARMD 08/07/17 0652 L.CHAMPION    Streptococcus species NOT DETECTED NOT DETECTED Final   Streptococcus agalactiae NOT DETECTED NOT DETECTED Final   Streptococcus pneumoniae NOT DETECTED NOT DETECTED Final   Streptococcus pyogenes NOT DETECTED NOT DETECTED Final   Acinetobacter baumannii NOT DETECTED NOT DETECTED Final   Enterobacteriaceae species NOT DETECTED NOT DETECTED Final   Enterobacter cloacae complex NOT DETECTED NOT DETECTED Final   Escherichia coli NOT DETECTED NOT DETECTED Final   Klebsiella oxytoca NOT DETECTED NOT DETECTED Final   Klebsiella pneumoniae NOT DETECTED NOT DETECTED Final   Proteus species NOT DETECTED NOT DETECTED Final   Serratia marcescens NOT DETECTED NOT DETECTED Final   Carbapenem resistance NOT DETECTED NOT DETECTED Final   Haemophilus influenzae NOT DETECTED NOT DETECTED Final   Neisseria meningitidis NOT DETECTED NOT  DETECTED Final   Pseudomonas aeruginosa NOT DETECTED NOT DETECTED Final   Candida albicans NOT DETECTED NOT DETECTED Final   Candida glabrata NOT DETECTED NOT DETECTED Final   Candida krusei NOT DETECTED NOT DETECTED Final   Candida parapsilosis NOT DETECTED NOT DETECTED Final   Candida tropicalis NOT DETECTED NOT DETECTED Final    Comment: Performed at [facility], [place] [address], [place], [place] [address]  Urine Culture     Status: Abnormal   Collection Time: 08/05/17  7:45 PM  Result Value Ref Range Status   Specimen Description URINE, CATHETERIZED  Final   Special Requests NONE  Final   Culture MULTIPLE SPECIES PRESENT, SUGGEST RECOLLECTION (A)  Final   Report Status 08/08/2017 FINAL  Final  MRSA PCR Screening     Status: Abnormal  Collection Time: 08/05/17  8:54 PM  Result Value Ref Range Status   MRSA by PCR POSITIVE (A) NEGATIVE Final    Comment:        The GeneXpert MRSA Assay (FDA approved for NASAL specimens only), is one component of a comprehensive MRSA colonization surveillance program. It is not intended to diagnose MRSA infection nor to guide or monitor treatment for MRSA infections. RESULT CALLED TO, READ BACK BY AND VERIFIED WITH: L HATFIELD AT 2343 ON 08.19.2018 BY NBROOKS   Culture, blood (routine x 2)     Status: None (Preliminary result)   Collection Time: 08/07/17  8:08 AM  Result Value Ref Range Status   Specimen Description BLOOD RIGHT HAND  Final   Special Requests IN PEDIATRIC BOTTLE Blood Culture adequate volume  Final   Culture   Final    NO GROWTH 2 DAYS Performed at [facility], [place] [address], [place], [place] [address]    Report Status PENDING  Incomplete  Culture, blood (routine x 2)     Status: None (Preliminary result)   Collection Time: 08/07/17  9:33 AM  Result Value Ref Range Status   Specimen Description BLOOD RIGHT HAND  Final   Special Requests IN PEDIATRIC BOTTLE Blood Culture adequate volume  Final   Culture    Final    NO GROWTH 2 DAYS Performed at [facility], [place] [address], [place], [place] [address]    Report Status PENDING  Incomplete     Time coordinating discharge: Over 30 minutes  SIGNED:   Cordelia Poche, MD [facility] 08/09/2017, 2:09 PM Pager [phone]  If 7PM-7AM, please contact night-coverage [email or web address] Password [email or web address]

## 2017-08-11 LAB — CULTURE, BLOOD (ROUTINE X 2)
CULTURE: NO GROWTH
SPECIAL REQUESTS: ADEQUATE

## 2017-08-12 LAB — CULTURE, BLOOD (ROUTINE X 2)
Culture: NO GROWTH
Culture: NO GROWTH
SPECIAL REQUESTS: ADEQUATE
Special Requests: ADEQUATE

## 2017-08-21 ENCOUNTER — Emergency Department (HOSPITAL_COMMUNITY)
Admission: EM | Admit: 2017-08-21 | Discharge: 2017-08-22 | Disposition: A | Discharge status: Home / Self Care | Payer: Medicare Other | Attending: Emergency Medicine | Admitting: Emergency Medicine

## 2017-08-21 ENCOUNTER — Emergency Department (HOSPITAL_COMMUNITY): Payer: Medicare Other

## 2017-08-21 ENCOUNTER — Encounter (HOSPITAL_COMMUNITY): Payer: Self-pay

## 2017-08-21 DIAGNOSIS — R310 Gross hematuria: Secondary | ICD-10-CM

## 2017-08-21 DIAGNOSIS — Z79899 Other long term (current) drug therapy: Secondary | ICD-10-CM | POA: Insufficient documentation

## 2017-08-21 DIAGNOSIS — I5032 Chronic diastolic (congestive) heart failure: Secondary | ICD-10-CM | POA: Insufficient documentation

## 2017-08-21 DIAGNOSIS — E119 Type 2 diabetes mellitus without complications: Secondary | ICD-10-CM | POA: Insufficient documentation

## 2017-08-21 DIAGNOSIS — G20 Parkinson's disease: Secondary | ICD-10-CM | POA: Insufficient documentation

## 2017-08-21 DIAGNOSIS — Y732 Prosthetic and other implants, materials and accessory gastroenterology and urology devices associated with adverse incidents: Secondary | ICD-10-CM | POA: Insufficient documentation

## 2017-08-21 DIAGNOSIS — Z7982 Long term (current) use of aspirin: Secondary | ICD-10-CM | POA: Insufficient documentation

## 2017-08-21 DIAGNOSIS — I110 Hypertensive heart disease with heart failure: Secondary | ICD-10-CM | POA: Insufficient documentation

## 2017-08-21 DIAGNOSIS — N390 Urinary tract infection, site not specified: Secondary | ICD-10-CM | POA: Insufficient documentation

## 2017-08-21 DIAGNOSIS — T83511A Infection and inflammatory reaction due to indwelling urethral catheter, initial encounter: Secondary | ICD-10-CM | POA: Insufficient documentation

## 2017-08-21 DIAGNOSIS — Z7902 Long term (current) use of antithrombotics/antiplatelets: Secondary | ICD-10-CM | POA: Insufficient documentation

## 2017-08-21 DIAGNOSIS — Z96649 Presence of unspecified artificial hip joint: Secondary | ICD-10-CM | POA: Insufficient documentation

## 2017-08-21 LAB — COMPREHENSIVE METABOLIC PANEL
ALBUMIN: 4 g/dL (ref 3.5–5.0)
ALK PHOS: 111 U/L (ref 38–126)
ALT: 16 U/L — ABNORMAL LOW (ref 17–63)
ANION GAP: 8 (ref 5–15)
AST: 22 U/L (ref 15–41)
BUN: 27 mg/dL — ABNORMAL HIGH (ref 6–20)
CHLORIDE: 107 mmol/L (ref 101–111)
CO2: 26 mmol/L (ref 22–32)
Calcium: 8.9 mg/dL (ref 8.9–10.3)
Creatinine, Ser: 1.4 mg/dL — ABNORMAL HIGH (ref 0.61–1.24)
GFR calc Af Amer: 53 mL/min — ABNORMAL LOW (ref >60)
GFR calc non Af Amer: 46 mL/min — ABNORMAL LOW (ref >60)
GLUCOSE: 106 mg/dL — AB (ref 65–99)
POTASSIUM: 4.8 mmol/L (ref 3.5–5.1)
SODIUM: 141 mmol/L (ref 135–145)
Total Bilirubin: 0.4 mg/dL (ref 0.3–1.2)
Total Protein: 5.9 g/dL — ABNORMAL LOW (ref 6.5–8.1)

## 2017-08-21 LAB — CBC WITH DIFFERENTIAL/PLATELET
BASOS PCT: 0 %
Basophils Absolute: 0 10*3/uL (ref 0.0–0.1)
EOS ABS: 0.5 10*3/uL (ref 0.0–0.7)
EOS PCT: 5 %
HCT: 36.4 % — ABNORMAL LOW (ref 39.0–52.0)
HEMOGLOBIN: 11.8 g/dL — AB (ref 13.0–17.0)
Lymphocytes Relative: 14 %
Lymphs Abs: 1.3 10*3/uL (ref 0.7–4.0)
MCH: 29.6 pg (ref 26.0–34.0)
MCHC: 32.4 g/dL (ref 30.0–36.0)
MCV: 91.5 fL (ref 78.0–100.0)
Monocytes Absolute: 0.6 10*3/uL (ref 0.1–1.0)
Monocytes Relative: 7 %
NEUTROS PCT: 74 %
Neutro Abs: 6.6 10*3/uL (ref 1.7–7.7)
PLATELETS: 240 10*3/uL (ref 150–400)
RBC: 3.98 MIL/uL — ABNORMAL LOW (ref 4.22–5.81)
RDW: 15 % (ref 11.5–15.5)
WBC: 8.9 10*3/uL (ref 4.0–10.5)

## 2017-08-21 LAB — URINALYSIS, ROUTINE W REFLEX MICROSCOPIC
Bilirubin Urine: NEGATIVE
Glucose, UA: NEGATIVE mg/dL
KETONES UR: 5 mg/dL — AB
Nitrite: NEGATIVE
PH: 6 (ref 5.0–8.0)
Protein, ur: 30 mg/dL — AB
SPECIFIC GRAVITY, URINE: 1.015 (ref 1.005–1.030)
SQUAMOUS EPITHELIAL / LPF: NONE SEEN

## 2017-08-21 LAB — I-STAT CG4 LACTIC ACID, ED: LACTIC ACID, VENOUS: 1.74 mmol/L (ref 0.5–1.9)

## 2017-08-21 MED ORDER — CIPROFLOXACIN HCL 500 MG PO TABS: 500.0000 mg | ORAL_TABLET | Freq: Two times a day (BID) | ORAL | 0 refills | Status: DC

## 2017-08-21 MED ORDER — NYSTATIN 100000 UNIT/GM EX POWD: CUTANEOUS | 2 refills | Status: DC

## 2017-08-21 NOTE — ED Provider Notes (Signed)
[place] DEPT Provider Note   CSN: [id number] Arrival date & time: 08/21/17  1451     History   Chief Complaint Chief Complaint  Patient presents with  . Hematuria  . Recurrent UTI  . FOLEY    HPI [name]. is a 82 y.o. male with a PMHx of BPH, chronic indwelling foley catheter with recurrent UTIs, DM2, HTN, parkinson's, dementia, dCHF, and other medical conditions listed below, who presents to the ED via EMS from the [facility] for evaluation of abd pain. Level 5 caveat due to dementia, pt unreliable historian. Per [name] at [facility], patient was sent to the ED for evaluation of abdominal pain that was reported to them earlier this afternoon. Nursing staff unable to provide any other history aside from the fact that he complained of abd pain so they sent him here. She denies that they've had any other concerns regarding the pt, denies fevers, n/v/d/c, changes in urination, or confusion/change in mental baseline. When asked if they had noticed the hematuria he showed up with, she stated that she was unaware and didn't know when it started. She states that since coming back to them after his hospital admission, his foley catheter hadn't been changed because he has home nursing that comes to do that and they hadn't come since he returned; [facility] arrived today but he had already left for the ED at that time. She doesn't know exactly when the last time the cath was changed, other than saying "maybe during the hospitalization". She states he gets daily bed baths but hasn't had a full shower since 08/17/17. She had not noticed any rashes to his genital area, but he's had Baza skin protectant cream placed in his genital area to help protect his skin. His MAR has ketoconazole cream "for fungal infection" ordered, but has not been given to him.   Upon arrival to the ED, nursing staff here noticed that his genital area had scaling, excoriated, erythematous, macerated skin in the  genital area extending towards the perineum with a thick white cream in the inguinal folds, and that his foley bag was tied around his lower leg, pulling the cath tubing tightly, and the foley bag itself was full of grossly bloody urine; they have emptied the bag and cleaned his perineum, and placed a skin cream on the area to soothe the skin. The pt states "I'm embarrassed, my pride is hurt because of my hygiene"; he states he has people who bathe him but he doesn't know if he's getting cleaned properly. He denies any complaints, including fevers, CP, SOB, abd pain, n/v/d/c, dysuria, penile pain, or any other complaints. He was unaware of the hematuria, and can't state how long that's been going on. He repeatedly states he's embarrassed about his hygiene. He is unable to provide any other information at this time, although he is oriented x3.   Chart review reveals he was admitted to the hospital 8/19-23/18 for pyelonephritis/UTI, treated with levaquin/vanc initially, then meropenam/vanc, then transitioned to [facility] for 14d course total. His UCx that hospitalization showed multiple species; prior UCx have demonstrated multiple different organisms, some pansensitive, and some showing resistance (positive cultures for pseudomonas, citrobacter, staphylococcus aureus, enterococcus, and Proteus. Some bacteria were noted to be pansensitive, but resistance was documented of other bacteria to Levaquin, cephalosporins, penicillins, and Tetracycline).  His nursing home [facility] shows that he finished [place] on 08/19/17. Of note, during his hospitalization, he had a CT abd/pelv w/o contrast which showed bilateral hydronephrosis  and perinephric stranding with hyperdense material in bladder presumed to be blood. When urology surgical resident Dr. Shanon Brow consulted on pt, they recommended continuation of foley and abx for UTI, and to f/up with urology in 1 month for recheck and repeat imaging to ensure resolution of the  hydronephrosis.   The history is provided by the patient, medical records and the nursing home. The history is limited by the absence of a caregiver. No language interpreter was used.  Hematuria  This is a recurrent problem. Episode onset: unknown. The problem occurs constantly. The problem has not changed since onset.Pertinent negatives include no chest pain, no abdominal pain and no shortness of breath. Nothing aggravates the symptoms. Nothing relieves the symptoms. He has tried nothing for the symptoms. The treatment provided no relief.    Past Medical History:  Diagnosis Date  . Anxiety   . Arthritis   . Benign prostate hyperplasia   . Bipolar disorder ([place])   . C2 cervical fracture ([name])    due to pt fall  . Chronic indwelling Foley catheter   . Depression   . Diabetes mellitus type II   . Falls   . Flu   . Hearing aid worn   . Hypertension   . Memory difficulty 08/21/2014  . Memory loss   . MRSA infection (methicillin-resistant Staphylococcus aureus)   . Neuromuscular disorder ([place])    parkinsons  . Parkinson disease ([place])   . Pneumonia   . SIRS (systemic inflammatory response syndrome) ([name])   . UTI (urinary tract infection)     Patient Active Problem List   Diagnosis Date Noted  . Dementia due to Parkinson's disease with behavioral disturbance ([place]) 08/06/2017  . Pyelonephritis 08/06/2017  . Contusion of head 08/06/2017  . Pressure injury of skin 08/06/2017  . Complicated UTI (urinary tract infection) 08/05/2017  . CAP (community acquired pneumonia) 11/29/2016  . Bacteremia 03/14/2016  . HCAP (healthcare-associated pneumonia)   . Pressure ulcer 10/01/2015  . Right lower lobe pneumonia ([place]) 10/01/2015  . Sepsis due to Gram-negative organism with acute respiratory failure ([place]) 10/01/2015  . Acute renal failure ([place]) 10/01/2015  . Acute respiratory failure with hypoxia ([place]) 09/30/2015  . Influenza A (H1N1) 03/25/2015  . Dementia without behavioral disturbance  03/25/2015  . Hyperkalemia 03/25/2015  . Falls 03/16/2015  . Sacral pressure ulcer 03/16/2015  . Fever 03/16/2015  . Memory difficulty 08/21/2014  . Malnutrition of moderate degree ([place]) 04/19/2014  . UTI (lower urinary tract infection) 04/18/2014  . Hypoxia 04/18/2014  . PNA (pneumonia) 04/18/2014  . Chronic diastolic congestive heart failure ([place]) 02/01/2013  . Pericardial effusion 02/01/2013  . Bradycardia 01/31/2013  . Empyema lung ([place]) 12/23/2012  . S/P thoracotomy 12/23/2012  . Abnormality of gait 12/05/2012  . Hypernatremia 08/13/2012  . Hypertension   . Diabetes mellitus, type 2 ([place])   . Arthritis   . Bipolar disorder ([place])   . Anxiety   . Chronic indwelling Foley catheter   . Neuromuscular disorder ([place])   . Bacteremia of undetermined etiology 06/17/2012  . Leukocytosis 04/17/2012  . Parkinson disease ([place]) 04/16/2012  . Elevated troponin 04/16/2012  . Hearing aid worn   . Diabetes mellitus ([place]) 07/06/2011    Past Surgical History:  Procedure Laterality Date  . TOTAL HIP ARTHROPLASTY    . VIDEO ASSISTED THORACOSCOPY (VATS)/EMPYEMA  12/16/2012   Procedure: VIDEO ASSISTED THORACOSCOPY (VATS)/EMPYEMA;  Surgeon: Melrose Nakayama, MD;  Location: [place];  Service: Thoracic;  Laterality: Right;  Marland Kitchen VIDEO BRONCHOSCOPY  12/16/2012   Procedure: VIDEO BRONCHOSCOPY;  Surgeon: Melrose Nakayama, MD;  Location: [place];  Service: Thoracic;  Laterality: Right;       Home Medications    Prior to Admission medications   Medication Sig Start Date End Date Taking? Authorizing Provider  acetaminophen (TYLENOL) 325 MG tablet Take 650 mg by mouth every 6 (six) hours as needed for mild pain.   Yes [provider]  aspirin 81 MG chewable tablet Chew 81 mg by mouth daily.   Yes [provider]  belladonna-opium (B&O SUPPRETTES) 16.2-30 MG suppository Place 1 suppository rectally every 8 (eight) hours as needed for pain. 8/52/77  Yes Delora Fuel, MD    bisacodyl (DULCOLAX) 10 MG suppository Place 10 mg rectally as needed for moderate constipation.   Yes [provider]  carbidopa-levodopa (SINEMET IR) 25-250 MG per tablet Take 1 tablet by mouth 3 (three) times daily.   Yes [provider]  clonazePAM (KLONOPIN) 0.5 MG tablet Take 0.5 tablets (0.25 mg total) by mouth 2 (two) times daily as needed (anxiety). 08/08/17  Yes Mariel Aloe, MD  clopidogrel (PLAVIX) 75 MG tablet Take 1 tablet (75 mg total) by mouth daily. Patient taking differently: Take 75 mg by mouth every evening.  10/07/15  Yes Florencia Reasons, MD  entacapone (COMTAN) 200 MG tablet Take 200 mg by mouth 3 (three) times daily.   Yes [provider]  feeding supplement, GLUCERNA SHAKE, (GLUCERNA SHAKE) LIQD Take 237 mLs by mouth 2 (two) times daily between meals. 03/16/16  Yes Kelvin Cellar, MD  ferrous sulfate 325 (65 FE) MG tablet Take 325 mg by mouth 2 (two) times daily.   Yes [provider]  guaifenesin (ROBITUSSIN) 100 MG/5ML syrup Take 200 mg by mouth every 4 (four) hours as needed for cough.    Yes [provider]  hydrALAZINE (APRESOLINE) 25 MG tablet Take 25 mg by mouth 2 (two) times daily.    Yes [provider]  isosorbide mononitrate (IMDUR) 30 MG 24 hr tablet Take 1 tablet (30 mg total) by mouth daily. 10/07/15  Yes Florencia Reasons, MD  ketoconazole (NIZORAL) 2 % cream Apply 1 application topically daily as needed (fungal infection).    Yes [provider]  lamoTRIgine (LAMICTAL) 150 MG tablet Take 1 tablet (150 mg total) by mouth 2 (two) times daily. 07/17/17  Yes Arfeen, Arlyce Harman, MD  lisinopril (PRINIVIL,ZESTRIL) 10 MG tablet Take 0.5 tablets (5 mg total) by mouth daily. 10/07/15  Yes Florencia Reasons, MD  loratadine (CLARITIN) 10 MG tablet Take 10 mg by mouth daily as needed for allergies.   Yes [provider]  Multiple Vitamin (MULTIVITAMIN WITH MINERALS) TABS tablet Take 1 tablet by mouth daily.   Yes [provider]  mupirocin ointment (BACTROBAN) 2 % Place 1 application into the nose 2 (two) times daily. 08/08/17  Yes Mariel Aloe, MD  pantoprazole (PROTONIX) 40 MG tablet Take 40 mg by mouth daily. Reported on 07/06/2016   Yes [provider]  polyethylene glycol (MIRALAX / GLYCOLAX) packet Take 17 g by mouth daily.   Yes [provider]  predniSONE (DELTASONE) 2.5 MG tablet Take 2.5 mg by mouth daily with breakfast.   Yes [provider]  QUEtiapine (SEROQUEL) 25 MG tablet Take 3 tablets (75 mg total) by mouth at bedtime. 07/17/17  Yes Arfeen, Arlyce Harman, MD  rosuvastatin (CRESTOR) 20 MG tablet Take 20 mg by mouth daily.   Yes [provider]  sertraline (ZOLOFT) 100 MG tablet Take 2 tablets (200 mg total) by mouth daily. 07/17/17  Yes Arfeen, Arlyce Harman, MD  Skin Protectants, Misc. (BAZA PROTECT EX) Apply topically 2 (two) times daily.   Yes [provider]  traZODone (DESYREL) 50 MG tablet Take 1 tablet (50 mg total) by mouth at bedtime. 01/17/17  Yes Arfeen, Arlyce Harman, MD    Family History Family History  Problem Relation Age of Onset  . Depression Father   . Cancer Mother   . Heart disease Mother     Social History Social History  Substance Use Topics  . Smoking status: Never Smoker  . Smokeless tobacco: Never Used  . Alcohol use No     Comment: occasional     Allergies   Plant sterols and stanols   Review of Systems Review of Systems  Unable to perform ROS: Dementia  Constitutional: Negative for fever.  Respiratory: Negative for shortness of breath.   Cardiovascular: Negative for chest pain.  Gastrointestinal: Negative for abdominal pain, constipation, diarrhea, nausea and vomiting.  Genitourinary: Positive for hematuria. Negative for dysuria and penile pain.  Skin: Positive for rash (nursing staff here reported genital rash/excoriations).  Allergic/Immunologic: Positive for immunocompromised state (DM2 and on chronic prednisone).    Psychiatric/Behavioral: Negative for confusion (per nursing, no confusion).   Level 5 caveat due to dementia  Physical Exam Updated Vital Signs BP 109/63 (BP Location: Left Arm)   Pulse (!) 50   Temp 97.8 F (36.6 C) (Oral)   Resp 18   Ht 5\' 10"  (1.778 m)   Wt 73.5 kg (162 lb)   SpO2 98%   BMI 23.24 kg/m   Physical Exam  Constitutional: He is oriented to person, place, and time. Vital signs are normal. He appears well-developed and well-nourished.  Non-toxic appearance. No distress.  Afebrile, nontoxic, NAD  HENT:  Head: Normocephalic and atraumatic.  Mouth/Throat: Oropharynx is clear and moist and mucous membranes are normal.  Eyes: Conjunctivae and EOM are normal. Right eye exhibits no discharge. Left eye exhibits no discharge.  Neck: Normal range of motion. Neck supple.  Cardiovascular: Regular rhythm, normal heart sounds and intact distal pulses.  Bradycardia present.  Exam reveals no gallop and no friction rub.   No murmur heard. Slightly bradycardic, similar to prior visits, HR 50-60s  Pulmonary/Chest: Effort normal and breath sounds normal. No respiratory distress. He has no decreased breath sounds. He has no wheezes. He has no rhonchi. He has no rales.  Abdominal: Soft. Normal appearance and bowel sounds are normal. He exhibits no distension. There is no tenderness. There is no rigidity, no rebound, no guarding, no CVA tenderness, no tenderness at McBurney's point and negative Murphy's sign.  Soft, NTND, +BS throughout, no r/g/r, neg murphy's, neg mcburney's, no CVA TTP   Genitourinary: Testes normal. Circumcised. Penile erythema present. No phimosis, paraphimosis, hypospadias or penile tenderness. No discharge found.  Genitourinary Comments: Chaperone present for exam Circumcised penis without phimosis/paraphimosis, hypospadias, tenderness, or discharge. Foley catheter in place, no urethral meatus injury noted, no bleeding around foley catheter. Genital area with macerated  excoriated erythematous skin to genitals and extending towards perineum, catheter tubing in place without any drainage noted around catheter, foley bag with small volume hematuria (nursing just cleaned patient and emptied foley bag prior to eval).  Testes with no swelling and No abnormal lie.  Musculoskeletal: Normal range of motion.  Neurological: He is alert and oriented to person, place, and time. He has normal strength. No sensory  deficit.  A&O x3  Skin: Skin is warm, dry and intact. No rash noted. There is erythema.  Genital area rash/yeast infection as mentioned above  Psychiatric: He has a normal mood and affect.  Nursing note and vitals reviewed.    ED Treatments / Results  Labs (all labs ordered are listed, but only abnormal results are displayed) Labs Reviewed  CBC WITH DIFFERENTIAL/PLATELET - Abnormal; Notable for the following:       Result Value   RBC 3.98 (*)    Hemoglobin 11.8 (*)    HCT 36.4 (*)    All other components within normal limits  COMPREHENSIVE METABOLIC PANEL - Abnormal; Notable for the following:    Glucose, Bld 106 (*)    BUN 27 (*)    Creatinine, Ser 1.40 (*)    Total Protein 5.9 (*)    ALT 16 (*)    GFR calc non Af Amer 46 (*)    GFR calc Af Amer 53 (*)    All other components within normal limits  URINALYSIS, ROUTINE W REFLEX MICROSCOPIC - Abnormal; Notable for the following:    APPearance HAZY (*)    Hgb urine dipstick LARGE (*)    Ketones, ur 5 (*)    Protein, ur 30 (*)    Leukocytes, UA LARGE (*)    Bacteria, UA RARE (*)    All other components within normal limits  URINE CULTURE  I-STAT CG4 LACTIC ACID, ED    EKG  EKG Interpretation None       Radiology Ct Renal Stone Study  Result Date: 08/21/2017 CLINICAL DATA:  Hematuria. Chronic Foley catheter, hematuria in Foley bag. Evaluate for resolution of hydronephrosis seen last month. EXAM: CT ABDOMEN AND PELVIS WITHOUT CONTRAST TECHNIQUE: Multidetector CT imaging of the abdomen and  pelvis was performed following the standard protocol without IV contrast. COMPARISON:  CT 08/05/2017 FINDINGS: Lower chest: The heart is enlarged. Small pericardial effusion is unchanged. New small peripheral left lower lobe consolidation with air bronchograms. Motion artifact through the lung bases. Hepatobiliary: No focal hepatic lesion allowing for lack contrast. Gallbladder physiologically distended, no calcified stone. No biliary dilatation. Pancreas: Parenchymal atrophy. No ductal dilatation or inflammation. Mild patient motion artifact through the level pancreas. Spleen: Calcified granuloma. Spleen is upper normal in size spanning 13.3 cm. Adrenals/Urinary Tract: Low-density nodularity of the left adrenal gland likely adenoma. Right adrenal gland is normal. Previous right hydronephrosis is near completely resolved with minimal residual prominence of the renal pelvis. The ureter is decompressed. Trace perinephric edema, improved from prior exam. No urolithiasis. Left hydronephrosis has resolved since prior exam. The ureter is decompressed. Improved perinephric edema from prior. There is an exophytic 2.1 x 1.9 hyperdense lesion arising from medial left kidney. No urolithiasis. Foley catheter decompressing the urinary bladder which is not well assessed. Perivesicular edema on prior exam has diminished. No bladder stone. Stomach/Bowel: Colonic diverticulosis without inflammation. Moderate diffuse colonic stool burden. No small bowel inflammation or obstruction. Stomach distended with ingested contents. Normal appendix. Vascular/Lymphatic: Aortic and branch atherosclerosis and tortuosity. No aneurysm. Small retroperitoneal nodes are similar to prior exam. No enlarged abdominal or pelvic lymph nodes. Reproductive: Suspect enlarged prostate gland, obscured by streak artifact from bilateral hip replacements. Other: No free air or intra-abdominal ascites. Musculoskeletal: Bilateral hip arthroplasties. Scoliosis and  degenerative change in the spine. Stable sclerotic lesion in the right iliac bone. There are no acute or suspicious osseous abnormalities. IMPRESSION: 1. Resolved left hydronephrosis, near completely resolved right hydronephrosis from recent  exam. Improved perinephric edema. Foley catheter in the bladder with decrease perivesicular soft tissue stranding. No urolithiasis. 2. Exophytic lesion from the medial left kidney measuring 2.1 x 1.9 cm. This is unchanged from recent exam, no more remote exams are available for comparison. Recommend further characterization with renal protocol CT or MRI without and with contrast. 3. New left lower lobe opacity in the included lung bases with air bronchograms, atelectasis versus pneumonia. 4. Colonic diverticulosis without inflammation. Again seen cardiomegaly and small pericardial effusion. Aortic Atherosclerosis (ICD10-I70.0). Electronically Signed   By: Jeb Levering M.D.   On: 08/21/2017 18:40    Procedures Procedures (including critical care time)  Medications Ordered in ED Medications - No data to display   Initial Impression / Assessment and Plan / ED Course  I have reviewed the triage vital signs and the nursing notes.  Pertinent labs & imaging results that were available during my care of the patient were reviewed by me and considered in my medical decision making (see chart for details).     82 y.o. male here for hematuria. Level 5 caveat due to dementia, although pt oriented x3. Most of the history is provided by nursing home staff member [name]; she reports that he c/o abd pain this afternoon so he was sent here; gets bed baths daily but hasn't had a full shower since 08/17/17; she doesn't know when his foley was last changed, states home health nursing does it but didn't come out since before his last admission (admitted 8/19-23/18 for pyelonephritis); they're unsure when he started having hematuria; report he's getting Baza cream on his genital  area (skin protectant) but not getting ketoconazole cream; they deny any fevers/n/v/d/c/confusion or any other complaints/concerns. Nursing staff here noticed groin was excoriated with macerated erythematous skin extending around the groin and to the perineum, scaling skin seemed as though pt hadn't been bathed in a while; foley bag full and tied around his lower leg, cath tubing stretched. Pt denies pain, but slightly unreliable historian, isn't sure when hematuria started. Repeatedly says he's embarrassed about his hygiene and his pride is hurt because he doesn't feel like he's being cleaned properly. On exam, erythematous excoriated skin to entire genital/perineal area, no abdominal tenderness, foley bag with small volume urine which is grossly bloody, afebrile and nontoxic. Will have nursing staff irrigate foley, obtain CBC/CMP, U/A with UCx, and lactic. Will hold off on abdominal imaging. Will reassess shortly. Discussed case with my attending Dr. Ralene Bathe who agrees with plan.   5:36 PM Nursing staff irrigated foley, reporting clots of blood coming around the catheter, no evidence of definite urethral meatus injury but hard to tell due to clots. Further chart review reveals that when he was hospitalized last month, urology resident Dr. Shanon Brow consulted and recommended repeat scan in 1 month to ensure resolution of hydronephrosis (attending: Dr. Diona Fanti). Will bladder scan, and get CT abd/pelv to see if hydronephrosis persists; will reassess shortly.   7:46 PM CBC w/diff without leukocytosis, mild anemia actually improved from prior visit (could be hemoconcentrated given how much higher H/H is today compared to 2 wks ago). CMP pending. Lactic negative. Bladder scan showed 65mLs. CT abd/pelv demonstrates resolved left hydronephrosis and nearly completely resolved right hydronephrosis, improved perinephric edema, foley in bladder with decreased perivesicular soft tissue stranding, no urolithiasis seen;  exophytic lesion of medial left kidney which is unchanged from recent CT; new LLL lung base opacity with air bronchograms, atelectasis vs PNA; given lack of leukocytosis or fever, doubt  PNA.  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 After bladder irrigation, continued blood in urine as well as coming around foley catheter, no definite injury seen to urethral meatus, but small clots seen around the tubing. Will consult with urology to discuss case and for assistance with next step. Pt denies any complaints at this time. Will reassess shortly.   8:15 PM CMP with very mildly elevated Cr 1.4 (up from baseline of ~1.2) and BUN 27 which is stable and unchanged. Dr. Lovena Neighbours of urology returning page; advised that it's likely a small urethral injury from having the foley tugged on; instructed to put 10 more cc's in foley balloon, blood will likely resolve on its own with this; a long as it's draining ok, then can f/up in office; treat UTI with cipro since last two UCx's showed sensitivity to that. Does not feel he needs admission for this. I have added the 10cc's into the balloon. Pt's POA [name] [phone]) requesting to speak with social worker regarding concern for poor care at nursing home. Will reach out to them. Otherwise, medically speaking, pt cleared to go back to nursing home with cipro abx and nystatin for yeast infection, and f/up with urology this week as well as PCP. Will reassess after social work consult.   10:31 PM Haven't heard from Education officer, museum, but POA now gone; will just have them reach out  to her tomorrow for ongoing help regarding her concerns of poor care at nursing home. No further clots from the urethra, still having some hematuria but seems to have slowed down with the additional saline in the balloon. Will d/c back to nursing home with previously outlined plan. I explained the diagnosis and have given explicit precautions to return to the ER including for any other new or worsening symptoms. The patient's POA understands and accepts the medical plan as it's been dictated and I have answered their questions. Discharge instructions concerning home care and prescriptions have been given. The patient is STABLE and is discharged to nursing home in good condition.    Final Clinical Impressions(s) / ED Diagnoses   Final diagnoses:  Gross hematuria  Urinary tract infection associated with indwelling urethral catheter, initial encounter ([place])    New Prescriptions New Prescriptions   CIPROFLOXACIN (CIPRO) 500 MG TABLET    Take 1 tablet (500 mg total) by mouth 2 (two) times daily. One po bid x 7 days   NYSTATIN (MYCOSTATIN/NYSTOP) POWDER    Apply to affected area of skin in genital area three times daily for at least 2 weeks, or until resolution of yeast infection.     [address], [place], [place] 08/21/17 2232    Quintella Reichert, MD 08/28/17 1251

## 2017-08-21 NOTE — ED Triage Notes (Signed)
Per [facility]- Pt resides at [facility]. DNR Yellow Copy present. Family present. Pt has chronic indwelling foley. Chronic UTI. Symptoms -pain, burning with urination. Completed antibiotics 2 days ago for UTI.

## 2017-08-21 NOTE — Discharge Instructions (Addendum)
Stay very well hydrated with plenty of water throughout the day. Take antibiotic until completed. Use nystatin powder to groin area to help with yeast infection. Keep the foley bag tied to the thigh and avoid letting the foley bag get too full. Follow up with the urologist in 5-7 days for recheck of symptoms and ongoing management of your urinary conditions, and follow up with your primary care physician in 5-7 days for recheck of ongoing symptoms but return to ER for emergent changing or worsening of symptoms. Please seek immediate care if you develop the following: You develop back pain.  Your symptoms are no better, or worse in 3 days. There is severe back pain or lower abdominal pain.  You develop chills.  You have a fever.  There is nausea or vomiting.  There is continued burning or discomfort with urination.

## 2017-08-21 NOTE — ED Notes (Signed)
Patient transported to CT 

## 2017-08-21 NOTE — ED Notes (Signed)
Bed: WA11 Expected date:  Expected time:  Means of arrival:  Comments: EMS-UTI 

## 2017-08-21 NOTE — ED Notes (Signed)
UPON ASSESSMENT OF FOLEY. LEG BAG CONTAINED AMBER URINE WITH NO CLOTS. IRRIGATED FOLEY 4 TIMES. [place]. RETURNED 40MLS. ASSESSED BLEEDING AROUND FOLEY PT TOLERATED. SMALL CLOTS ALSO NOTED. EDP REES MADE AWARE.

## 2017-08-21 NOTE — Clinical Social Work Note (Addendum)
Clinical Social Work Assessment  Patient Details  Name: [name]. MRN: [id number] Date of Birth: September 27, 1935  Date of referral:  08/21/17               Reason for consult:  Facility Placement                Permission sought to share information with:  Facility Art therapist granted to share information::  Yes, Verbal Permission Granted  Name::        Agency::     Relationship::     Contact Information:     Housing/Transportation Living arrangements for the past 2 months:  [place] of Information:   (POA [name] at [phone]) Patient Interpreter Needed:  None Criminal Activity/Legal Involvement Pertinent to Current Situation/Hospitalization:    Significant Relationships:   (POA) Lives with:  Facility Resident Do you feel safe going back to the place where you live?  Yes Need for family participation in patient care:  Yes (Comment)  Care giving concerns:  Per EDP notes, pt was D/C'd from [facility] on 8/22 and per [facility] RN at [facility] "patient was sent to the ED for evaluation of abdominal pain that was reported to them earlier this afternoon. Nursing staff unable to provide any other history aside from the fact that he complained of abd pain so they sent him here. She denies that they've had any other concerns regarding the pt, denies fevers, n/v/d/c, changes in urination, or confusion/change in mental baseline. When asked if they had noticed the hematuria he showed up with, she stated that she was unaware and didn't know when it started. She states that since coming back to them after his hospital admission, his foley catheter hadn't been changed because he has home nursing that comes to do that and they hadn't come since he returned; [facility] arrived today but he had already left for the ED at that time. She doesn't know exactly when the last time the cath was changed, other than saying "maybe during the hospitalization".   EDP  continues: "Upon arrival to the ED, nursing staff here noticed that his genital area had scaling, excoriated, erythematous, macerated skin in the genital area extending towards the perineum with a thick white cream in the inguinal folds, and that his foley bag was tied around his lower leg, pulling the cath tubing tightly, and the foley bag itself was full of grossly bloody urine; they have emptied the bag and cleaned his perineum, and placed a skin cream on the area to soothe the skin".  CSW Asst Director Gay Filler stated if pt is appropriate for D/C, per EDP, then D/C back to the facility and CM will follow up in the morning to insure pt is seen immediately by [facility] and that the apprpriate [facility] agency is aware that the pt has not been seen and that a [place] had not arrived until 9/4 after pt had been taken by EMS to the ED.  CSW Asst Director Gay Filler also directed the CSW to alert CSW Directors to follow up with [place] on 08/22/17 to insure service given is appropriate upon D/C from the [facility] ED.  CSW contacted CSW Director who directed the CSW to create a document in the safety zone portal to document pt needs.  CSW will update the EDP, RN and meet with family if pt is to be D/C'd.  11:05 PM CSW met with EDP, pt appropriate for D/C, RN updated.  11:14 PM CSW called and spoke to pt's POA [name] at [phone] and updated her.  CSW stated Asst CM Director and CSW Director has been updated and that [place] will document in a "safety zone portal" the situation as described by the EDP. And the notes.  Social Worker assessment / plan:  CSW spoke with pt's POA [name] at ph: [phone] and confirmed pt's POA's plan for the pt to be discharged to [facility] to live at discharge.  CSW provided active listening and validated pt's POA's concerns.  Pt has been living at [facility] since 2013 or 2014 per pt's POA, prior to being admitted to [facility].  CSW created a safety zone in the  portal.  Employment status:  Retired Forensic scientist:  Medicare, Managed Care PT Recommendations:  Not assessed at this time Information / Referral to community resources:     Patient/Family's Response to care:  Patient alert and oriented.  Patient's POA agreeable to plan.  Pt's POA supportive and strongly involved in pt.'s care.  Pt.'s POA pleasant and appreciated CSW intervention.    Patient/Family's Understanding of and Emotional Response to Diagnosis, Current Treatment, and Prognosis:  Still assessing   Emotional Assessment Appearance:    Attitude/Demeanor/Rapport:  Unable to Assess Affect (typically observed):  Unable to Assess Orientation:    Alcohol / Substance use:    Psych involvement (Current and /or in the community):     Discharge Needs  Concerns to be addressed:  Home Safety Concerns ([place]) Readmission within the last 30 days:    Current discharge risk:  None Barriers to Discharge:  No Barriers Identified   [name], LCSWA 08/21/2017, 11:22 PM

## 2017-08-21 NOTE — ED Notes (Signed)
EDPA Provider at bedside. 

## 2017-08-21 NOTE — ED Notes (Signed)
Pt.'s daughter/POA [name] Heuskin  Cell no. [phone].

## 2017-08-21 NOTE — ED Notes (Signed)
[facility] called for transport.  

## 2017-08-21 NOTE — Progress Notes (Addendum)
CSW received a call from ED secretary saying CM has not returned call regarding consult for CM.  CSW contacted CM and left VM.  CSW the  [employer] the CM Asst Director at home and explained the situation.  Per EDP notes, pt was D/C'd from [facility] on 8/22 and [place] at [facility] "patient was sent to the ED for evaluation of abdominal pain that was reported to them earlier this afternoon. Nursing staff unable to provide any other history aside from the fact that he complained of abd pain so they sent him here. She denies that they've had any other concerns regarding the pt, denies fevers, n/v/d/c, changes in urination, or confusion/change in mental baseline. When asked if they had noticed the hematuria he showed up with, she stated that she was unaware and didn't know when it started. She states that since coming back to them after his hospital admission, his foley catheter hadn't been changed because he has home nursing that comes to do that and they hadn't come since he returned; [facility] arrived today but he had already left for the ED at that time. She doesn't know exactly when the last time the cath was changed, other than saying "maybe during the hospitalization".    EDP continues: "Upon arrival to the ED, nursing staff here noticed that his genital area had scaling, excoriated, erythematous, macerated skin in the genital area extending towards the perineum with a thick white cream in the inguinal folds, and that his foley bag was tied around his lower leg, pulling the cath tubing tightly, and the foley bag itself was full of grossly bloody urine; they have emptied the bag and cleaned his perineum, and placed a skin cream on the area to soothe the skin".  CSW Asst Director Gay Filler stated if pt is appropriate for D/C, per EDP, then D/C back to the facility and CM will follow up in the morning to insure pt is seen immediately by [facility] and that the apprpriate [facility] agency is aware that the pt has not been seen and  that a [place] had not arrived until 9/4 after pt had been taken by EMS to the ED.  CSW Asst Director Gay Filler also directed the CSW to alert CSW Directors to follow up with [place] on 08/22/17 to insure service given is appropriate upon D/C from the [facility] ED.  CSW contacted CSW Director who directed the CSW to create a document in the safety zone portal to document pt needs.  CSW will update the EDP, RN and meet with family if pt is to be D/C'd.  11:05 PM CSW met with EDP, pt appropriate for D/C, RN updated.  11:14 PM CSW called and spoke to pt's POA [name] Heuskin at [phone] and updated her.  CSW stated Asst CM Director and CSW Director has been updated and that [place] will document in a "safety zone portal" the situation as described by the EDP. And the notes.  CSW completed assessment.   Please reconsult if future social work needs arise.  CSW signing off, as social work intervention is no longer needed.  Alphonse Guild. Dani Danis, LCSW, LCAS, CSI Clinical Social Worker Ph: [phone]

## 2017-08-22 ENCOUNTER — Emergency Department (HOSPITAL_COMMUNITY)
Admission: EM | Admit: 2017-08-22 | Discharge: 2017-08-23 | Disposition: A | Discharge status: Home / Self Care | Payer: Medicare Other | Attending: Emergency Medicine | Admitting: Emergency Medicine

## 2017-08-22 ENCOUNTER — Encounter (HOSPITAL_COMMUNITY): Payer: Self-pay | Admitting: Oncology

## 2017-08-22 ENCOUNTER — Emergency Department (HOSPITAL_COMMUNITY)
Admission: EM | Admit: 2017-08-22 | Discharge: 2017-08-22 | Disposition: A | Discharge status: Home / Self Care | Payer: Medicare Other | Attending: Emergency Medicine | Admitting: Emergency Medicine

## 2017-08-22 DIAGNOSIS — Z96649 Presence of unspecified artificial hip joint: Secondary | ICD-10-CM | POA: Insufficient documentation

## 2017-08-22 DIAGNOSIS — Z7902 Long term (current) use of antithrombotics/antiplatelets: Secondary | ICD-10-CM | POA: Insufficient documentation

## 2017-08-22 DIAGNOSIS — I5032 Chronic diastolic (congestive) heart failure: Secondary | ICD-10-CM | POA: Insufficient documentation

## 2017-08-22 DIAGNOSIS — Z79899 Other long term (current) drug therapy: Secondary | ICD-10-CM | POA: Insufficient documentation

## 2017-08-22 DIAGNOSIS — Z7982 Long term (current) use of aspirin: Secondary | ICD-10-CM | POA: Insufficient documentation

## 2017-08-22 DIAGNOSIS — F0391 Unspecified dementia with behavioral disturbance: Secondary | ICD-10-CM | POA: Insufficient documentation

## 2017-08-22 DIAGNOSIS — N3001 Acute cystitis with hematuria: Secondary | ICD-10-CM | POA: Insufficient documentation

## 2017-08-22 DIAGNOSIS — R310 Gross hematuria: Secondary | ICD-10-CM

## 2017-08-22 DIAGNOSIS — E119 Type 2 diabetes mellitus without complications: Secondary | ICD-10-CM | POA: Insufficient documentation

## 2017-08-22 DIAGNOSIS — G20 Parkinson's disease: Secondary | ICD-10-CM | POA: Insufficient documentation

## 2017-08-22 DIAGNOSIS — R319 Hematuria, unspecified: Secondary | ICD-10-CM | POA: Insufficient documentation

## 2017-08-22 DIAGNOSIS — I110 Hypertensive heart disease with heart failure: Secondary | ICD-10-CM | POA: Insufficient documentation

## 2017-08-22 LAB — CBC WITH DIFFERENTIAL/PLATELET
Basophils Absolute: 0 10*3/uL (ref 0.0–0.1)
Basophils Relative: 0 %
Eosinophils Absolute: 0.4 10*3/uL (ref 0.0–0.7)
Eosinophils Relative: 4 %
HCT: 34 % — ABNORMAL LOW (ref 39.0–52.0)
Hemoglobin: 11.2 g/dL — ABNORMAL LOW (ref 13.0–17.0)
Lymphocytes Relative: 14 %
Lymphs Abs: 1.4 10*3/uL (ref 0.7–4.0)
MCH: 29.9 pg (ref 26.0–34.0)
MCHC: 32.9 g/dL (ref 30.0–36.0)
MCV: 90.7 fL (ref 78.0–100.0)
Monocytes Absolute: 0.5 10*3/uL (ref 0.1–1.0)
Monocytes Relative: 5 %
Neutro Abs: 7.3 10*3/uL (ref 1.7–7.7)
Neutrophils Relative %: 77 %
Platelets: 231 10*3/uL (ref 150–400)
RBC: 3.75 MIL/uL — ABNORMAL LOW (ref 4.22–5.81)
RDW: 14.8 % (ref 11.5–15.5)
WBC: 9.5 10*3/uL (ref 4.0–10.5)

## 2017-08-22 NOTE — ED Notes (Signed)
Patient was alert, and stable upon discharge. RN went over AVS and patient had no further questions. [facility].

## 2017-08-22 NOTE — ED Triage Notes (Signed)
He comes to [place] from [facility] with c/o of gross hematuria through his foley cath, plus "clots around the catheter tip at his penis. He arrives awake, alert and in no distress. He has already been seen here a couple of times for this issue. He does indeed have very bloody urine in his foley bag.

## 2017-08-22 NOTE — ED Notes (Signed)
[facility] called for transport.  

## 2017-08-22 NOTE — ED Notes (Signed)
[place] POA [phone]

## 2017-08-22 NOTE — ED Notes (Signed)
Large amount (~223mL) of blood and blood clots cleaned up from underneath pt.

## 2017-08-22 NOTE — ED Provider Notes (Signed)
[facility] DEPT Provider Note   CSN: [id number] Arrival date & time: 08/22/17  2035     History   Chief Complaint Chief Complaint  Patient presents with  . Hematuria    HPI [name]. is a 82 y.o. male.  82 yo M With a chief complaint of hematuria. Level V caveat dementia. Patient has been seen for the same 2 days in a row. He was recently discharged earlier today. When he gets the facility they did not feel they could take care of him and to send him back. Patient currently has no complaints other than he would like to eat something and be able to brush his teeth. He is unsure if the bleeding had worsened.   The history is provided by the patient.  Hematuria  This is a new problem. The current episode started 2 days ago. The problem occurs constantly. The problem has not changed since onset.Pertinent negatives include no chest pain, no abdominal pain, no headaches and no shortness of breath. Nothing aggravates the symptoms. Nothing relieves the symptoms. He has tried nothing for the symptoms. The treatment provided no relief.    Past Medical History:  Diagnosis Date  . Anxiety   . Arthritis   . Benign prostate hyperplasia   . Bipolar disorder ([place])   . C2 cervical fracture ([name])    due to pt fall  . Chronic indwelling Foley catheter   . Depression   . Diabetes mellitus type II   . Falls   . Flu   . Hearing aid worn   . Hypertension   . Memory difficulty 08/21/2014  . Memory loss   . MRSA infection (methicillin-resistant Staphylococcus aureus)   . Neuromuscular disorder ([place])    parkinsons  . Parkinson disease ([place])   . Pneumonia   . SIRS (systemic inflammatory response syndrome) ([name])   . UTI (urinary tract infection)     Patient Active Problem List   Diagnosis Date Noted  . 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 Pyelonephritis 08/06/2017  . Contusion of head 08/06/2017  . Pressure injury of skin  08/06/2017  . Complicated UTI (urinary tract infection) 08/05/2017  . CAP (community acquired pneumonia) 11/29/2016  . Bacteremia 03/14/2016  . HCAP (healthcare-associated pneumonia)   . Pressure ulcer 10/01/2015  . Right lower lobe pneumonia ([place]) 10/01/2015  . Sepsis due to Gram-negative organism with acute respiratory failure ([place]) 10/01/2015  . Acute renal failure ([place]) 10/01/2015  . Acute respiratory failure with hypoxia ([place]) 09/30/2015  . Influenza A (H1N1) 03/25/2015  . Dementia without behavioral disturbance 03/25/2015  . Hyperkalemia 03/25/2015  . Falls 03/16/2015  . Sacral pressure ulcer 03/16/2015  . Fever 03/16/2015  . Memory difficulty 08/21/2014  . Malnutrition of moderate degree ([place]) 04/19/2014  . UTI (lower urinary tract infection) 04/18/2014  . Hypoxia 04/18/2014  . PNA (pneumonia) 04/18/2014  . Chronic diastolic congestive heart failure ([place]) 02/01/2013  . Pericardial effusion 02/01/2013  . Bradycardia 01/31/2013  . Empyema lung ([place]) 12/23/2012  . S/P thoracotomy 12/23/2012  . Abnormality of gait 12/05/2012  . Hypernatremia 08/13/2012  . Hypertension   . Diabetes mellitus, type 2 ([place])   . Arthritis   . Bipolar disorder ([place])   . Anxiety   . Chronic indwelling Foley catheter   . Neuromuscular disorder ([place])   . Bacteremia of undetermined etiology 06/17/2012  . Leukocytosis 04/17/2012  . Parkinson disease ([place]) 04/16/2012  . Elevated troponin 04/16/2012  . Hearing  aid worn   . Diabetes mellitus ([place]) 07/06/2011    Past Surgical History:  Procedure Laterality Date  . TOTAL HIP ARTHROPLASTY    . VIDEO ASSISTED THORACOSCOPY (VATS)/EMPYEMA  12/16/2012   Procedure: VIDEO ASSISTED THORACOSCOPY (VATS)/EMPYEMA;  Surgeon: Melrose Nakayama, MD;  Location: [facility];  Service: Thoracic;  Laterality: Right;  Marland Kitchen VIDEO BRONCHOSCOPY  12/16/2012   Procedure: VIDEO BRONCHOSCOPY;  Surgeon: Melrose Nakayama, MD;  Location: [facility];  Service: Thoracic;  Laterality:  Right;       Home Medications    Prior to Admission medications   Medication Sig Start Date End Date Taking? Authorizing Provider  aspirin 81 MG chewable tablet Chew 81 mg by mouth daily.   Yes [provider]  carbidopa-levodopa (SINEMET IR) 25-250 MG per tablet Take 1 tablet by mouth 3 (three) times daily.   Yes [provider]  clonazePAM (KLONOPIN) 0.5 MG tablet Take 0.5 tablets (0.25 mg total) by mouth 2 (two) times daily as needed (anxiety). 08/08/17  Yes Mariel Aloe, MD  entacapone (COMTAN) 200 MG tablet Take 200 mg by mouth 3 (three) times daily.   Yes [provider]  feeding supplement, GLUCERNA SHAKE, (GLUCERNA SHAKE) LIQD Take 237 mLs by mouth 2 (two) times daily between meals. 03/16/16  Yes Kelvin Cellar, MD  ferrous sulfate 325 (65 FE) MG tablet Take 325 mg by mouth 2 (two) times daily.   Yes [provider]  hydrALAZINE (APRESOLINE) 25 MG tablet Take 12.5 mg by mouth 2 (two) times daily.    Yes [provider]  isosorbide mononitrate (IMDUR) 30 MG 24 hr tablet Take 1 tablet (30 mg total) by mouth daily. 10/07/15  Yes Florencia Reasons, MD  lamoTRIgine (LAMICTAL) 150 MG tablet Take 1 tablet (150 mg total) by mouth 2 (two) times daily. 07/17/17  Yes Arfeen, Arlyce Harman, MD  Multiple Vitamin (MULTIVITAMIN WITH MINERALS) TABS tablet Take 1 tablet by mouth daily.   Yes [provider]  mupirocin ointment (BACTROBAN) 2 % Place 1 application into the nose 2 (two) times daily. 08/08/17  Yes Mariel Aloe, MD  pantoprazole (PROTONIX) 40 MG tablet Take 40 mg by mouth daily. Reported on 07/06/2016   Yes [provider]  polyethylene glycol (MIRALAX / GLYCOLAX) packet Take 17 g by mouth daily.   Yes [provider]  predniSONE (DELTASONE) 2.5 MG tablet Take 2.5 mg by mouth daily with breakfast.   Yes [provider]  sertraline (ZOLOFT) 100 MG tablet Take 2 tablets (200 mg total) by mouth daily. 07/17/17  Yes Arfeen,  Arlyce Harman, MD  Skin Protectants, Misc. (BAZA PROTECT EX) Apply 1 application topically 2 (two) times daily as needed (dermatitis).    Yes [provider]  acetaminophen (TYLENOL) 325 MG tablet Take 650 mg by mouth every 6 (six) hours as needed for mild pain.    [provider]  belladonna-opium (B&O SUPPRETTES) 16.2-30 MG suppository Place 1 suppository rectally every 8 (eight) hours as needed for pain. 06/12/93   Delora Fuel, MD  bisacodyl (DULCOLAX) 10 MG suppository Place 10 mg rectally as needed for moderate constipation.    [provider]  ciprofloxacin (CIPRO) 500 MG tablet Take 1 tablet (500 mg total) by mouth 2 (two) times daily. One po bid x 7 days 08/21/17   Street, [place], [place]  clopidogrel (PLAVIX) 75 MG tablet Take 1 tablet (75 mg total) by mouth daily. Patient taking differently: Take 75 mg by mouth every evening.  10/07/15  Florencia Reasons, MD  guaifenesin (ROBITUSSIN) 100 MG/5ML syrup Take 200 mg by mouth every 4 (four) hours as needed for cough.     [provider]  ketoconazole (NIZORAL) 2 % cream Apply 1 application topically daily as needed (fungal infection).     [provider]  lisinopril (PRINIVIL,ZESTRIL) 10 MG tablet Take 0.5 tablets (5 mg total) by mouth daily. Patient not taking: Reported on 08/22/2017 10/07/15   Florencia Reasons, MD  loratadine (CLARITIN) 10 MG tablet Take 10 mg by mouth daily as needed for allergies.    [provider]  nystatin (MYCOSTATIN/NYSTOP) powder Apply to affected area of skin in genital area three times daily for at least 2 weeks, or until resolution of yeast infection. 08/21/17   Street, Mercedes, PA-C  QUEtiapine (SEROQUEL) 25 MG tablet Take 3 tablets (75 mg total) by mouth at bedtime. 07/17/17   Arfeen, Arlyce Harman, MD  rosuvastatin (CRESTOR) 20 MG tablet Take 20 mg by mouth daily.    [provider]  traZODone (DESYREL) 50 MG tablet Take 1 tablet (50 mg total) by mouth at bedtime. 01/17/17   Arfeen, Arlyce Harman, MD    Family History Family History  Problem Relation Age of Onset  . Depression Father   . Cancer Mother   . Heart disease Mother     Social History Social History  Substance Use Topics  . Smoking status: Never Smoker  . Smokeless tobacco: Never Used  . Alcohol use No     Comment: occasional     Allergies   Plant sterols and stanols   Review of Systems Review of Systems  Unable to perform ROS: Dementia  Constitutional: Negative for chills and fever.  HENT: Negative for congestion and facial swelling.   Eyes: Negative for discharge and visual disturbance.  Respiratory: Negative for shortness of breath.   Cardiovascular: Negative for chest pain and palpitations.  Gastrointestinal: Negative for abdominal pain, diarrhea and vomiting.  Genitourinary: Positive for hematuria.  Musculoskeletal: Negative for arthralgias and myalgias.  Skin: Negative for color change and rash.  Neurological: Negative for tremors, syncope and headaches.  Psychiatric/Behavioral: Negative for confusion and dysphoric mood.     Physical Exam Updated Vital Signs BP (!) 132/94 (BP Location: Left Arm)   Pulse 74   Temp 98.7 F (37.1 C) (Oral)   Resp 18   SpO2 96%   Physical Exam  Constitutional: He appears well-developed and well-nourished.  Chronically ill appearing  HENT:  Head: Normocephalic and atraumatic.  Eyes: Pupils are equal, round, and reactive to light. EOM are normal.  Neck: Normal range of motion. Neck supple. No JVD present.  Cardiovascular: Normal rate and regular rhythm.  Exam reveals no gallop and no friction rub.   No murmur heard. Pulmonary/Chest: No respiratory distress. He has no wheezes.  Abdominal: He exhibits no distension and no mass. There is no tenderness. There is no rebound and no guarding.  Musculoskeletal: Normal range of motion.  Neurological: He is alert.  Confused to scenario  Skin: No rash noted. No pallor.  Psychiatric: He has a normal mood and  affect. His behavior is normal.  Nursing note and vitals reviewed.    ED Treatments / Results  Labs (all labs ordered are listed, but only abnormal results are displayed) Labs Reviewed  I-STAT CHEM 8, ED    EKG  EKG Interpretation None       Radiology Ct Renal Stone Study  Result Date: 08/21/2017 CLINICAL DATA:  Hematuria. Chronic Foley  catheter, hematuria in Foley bag. Evaluate for resolution of hydronephrosis seen last month. EXAM: CT ABDOMEN AND PELVIS WITHOUT CONTRAST TECHNIQUE: Multidetector CT imaging of the abdomen and pelvis was performed following the standard protocol without IV contrast. COMPARISON:  CT 08/05/2017 FINDINGS: Lower chest: The heart is enlarged. Small pericardial effusion is unchanged. New small peripheral left lower lobe consolidation with air bronchograms. Motion artifact through the lung bases. Hepatobiliary: No focal hepatic lesion allowing for lack contrast. Gallbladder physiologically distended, no calcified stone. No biliary dilatation. Pancreas: Parenchymal atrophy. No ductal dilatation or inflammation. Mild patient motion artifact through the level pancreas. Spleen: Calcified granuloma. Spleen is upper normal in size spanning 13.3 cm. Adrenals/Urinary Tract: Low-density nodularity of the left adrenal gland likely adenoma. Right adrenal gland is normal. Previous right hydronephrosis is near completely resolved with minimal residual prominence of the renal pelvis. The ureter is decompressed. Trace perinephric edema, improved from prior exam. No urolithiasis. Left hydronephrosis has resolved since prior exam. The ureter is decompressed. Improved perinephric edema from prior. There is an exophytic 2.1 x 1.9 hyperdense lesion arising from medial left kidney. No urolithiasis. Foley catheter decompressing the urinary bladder which is not well assessed. Perivesicular edema on prior exam has diminished. No bladder stone. Stomach/Bowel: Colonic diverticulosis without  inflammation. Moderate diffuse colonic stool burden. No small bowel inflammation or obstruction. Stomach distended with ingested contents. Normal appendix. Vascular/Lymphatic: Aortic and branch atherosclerosis and tortuosity. No aneurysm. Small retroperitoneal nodes are similar to prior exam. No enlarged abdominal or pelvic lymph nodes. Reproductive: Suspect enlarged prostate gland, obscured by streak artifact from bilateral hip replacements. Other: No free air or intra-abdominal ascites. Musculoskeletal: Bilateral hip arthroplasties. Scoliosis and degenerative change in the spine. Stable sclerotic lesion in the right iliac bone. There are no acute or suspicious osseous abnormalities. IMPRESSION: 1. Resolved left hydronephrosis, near completely resolved right hydronephrosis from recent exam. Improved perinephric edema. Foley catheter in the bladder with decrease perivesicular soft tissue stranding. No urolithiasis. 2. Exophytic lesion from the medial left kidney measuring 2.1 x 1.9 cm. This is unchanged from recent exam, no more remote exams are available for comparison. Recommend further characterization with renal protocol CT or MRI without and with contrast. 3. New left lower lobe opacity in the included lung bases with air bronchograms, atelectasis versus pneumonia. 4. Colonic diverticulosis without inflammation. Again seen cardiomegaly and small pericardial effusion. Aortic Atherosclerosis (ICD10-I70.0). Electronically Signed   By: Jeb Levering M.D.   On: 08/21/2017 18:40    Procedures Procedures (including critical care time)  Medications Ordered in ED Medications - No data to display   Initial Impression / Assessment and Plan / ED Course  I have reviewed the triage vital signs and the nursing notes.  Pertinent labs & imaging results that were available during my care of the patient were reviewed by me and considered in my medical decision making (see chart for details).     82 yo M with a  chief complaint hematuria. Patient has recently had imaging done as well as lab work. His case again discussed with urology previously and had been discharged home. The staff at his nursing home feel that there unable to take care of him. There is apparently no nursing available overnight in case his Foley were to get clogged. On my exam of the patient he had free-flowing urine in the tube. There is some question about it being clogged previously.  Will irrigate, recheck cbc.  D/c back when able. Discussed with Dr. Mariane Masters, please see  his note for further details.   The patients results and plan were reviewed and discussed.   Any x-rays performed were independently reviewed by myself.   Differential diagnosis were considered with the presenting HPI.  Medications - No data to display  Vitals:   08/22/17 2049  BP: (!) 132/94  Pulse: 74  Resp: 18  Temp: 98.7 F (37.1 C)  TempSrc: Oral  SpO2: 96%    Final diagnoses:  Gross hematuria       Final Clinical Impressions(s) / ED Diagnoses   Final diagnoses:  Gross hematuria    New Prescriptions New Prescriptions   No medications on file     Deno Etienne, DO 08/23/17 0009

## 2017-08-22 NOTE — ED Notes (Signed)
Bed: QQ24 Expected date:  Expected time:  Means of arrival:  Comments: EMS/hematuria/chronic foley

## 2017-08-22 NOTE — Care Management Note (Signed)
Case Management Note  CM received a call from Gay Filler, CSW/CM AD, at 10:11 am on pt stating he was in the ED last night and was on his way back to the ED today with the same foley complaints.  Per staff at [facility] he was active with [facility] but that he had not been seen by [facility] since inpt hospital admission D/C on 8/22.  After chart review, CM noted that PT recommended SNF placement for D/C from admission.  CSW spoke with POA at that time who wanted pt to return to [facility] with their in house PT services and he was not placed into SNF and he was not set up with [facility] at that time.  Tim, hospital liaison with [facility], confirmed pt is active with them for nursing services at 10:46 am.  He further states they plan to see him today.  [name] sent them the nursing referral.  Updated Dr. Wilson Singer on information gathered.  CM will continue to follow and wait for further instructions from EDP.

## 2017-08-22 NOTE — ED Notes (Signed)
Upon patient discharge, [facility] EMT alerted this writer that patient's catheter was not draining and grossly bloody. This RN entered the room and observed this. EDP Tyrone Nine made aware and no new recommendations or orders given. Catheter irrigated manually with 1000 cc sterile water. Frank red blood and thick clots noted upon beginning manual irrigation. Upon completion of irrigation urine noted to be pink and no other clots or frank red blood seen. [facility] comfortable with taking patient, patient discharged back to facility.

## 2017-08-22 NOTE — Care Management Note (Signed)
Case Management Note  Per EDP plan with POA has changed is now to go back to [facility] from the ED.  Updated Tim with [facility] that pt was being D/C back to [facility].  CSW updated too.

## 2017-08-22 NOTE — ED Provider Notes (Signed)
[facility] DEPT Provider Note   CSN: [id number] Arrival date & time: 08/22/17  1022     History   Chief Complaint Chief Complaint  Patient presents with  . Hematuria    HPI [name]. is a 82 y.o. male.  HPI   55yM with hematuria. Chronic foley. Recent admission 8/19-23 with pyelonephritis and discharged and vantin. Seen in ED yesterday for abdominal pain and hematuria. Returning again today with continued hematuria and leakage around catheter. Symptoms seem unchanged, but care taker concerns for current facility [facility]) unable to provide care he requires (specifically, irrigating cathter). I discussed with POA, [name] Heuskin [phone]). She says she is actively pursuing placement elsewhere. Pt denies pain. He is mostly concerned with appearance of his toenails.   Past Medical History:  Diagnosis Date  . Anxiety   . Arthritis   . Benign prostate hyperplasia   . Bipolar disorder ([place])   . C2 cervical fracture ([name])    due to pt fall  . Chronic indwelling Foley catheter   . Depression   . Diabetes mellitus type II   . Falls   . Flu   . Hearing aid worn   . Hypertension   . Memory difficulty 08/21/2014  . Memory loss   . MRSA infection (methicillin-resistant Staphylococcus aureus)   . Neuromuscular disorder ([place])    parkinsons  . Parkinson disease ([place])   . Pneumonia   . SIRS (systemic inflammatory response syndrome) ([name])   . UTI (urinary tract infection)     Patient Active Problem List   Diagnosis Date Noted  . 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 Pyelonephritis 08/06/2017  . Contusion of head 08/06/2017  . Pressure injury of skin 08/06/2017  . Complicated UTI (urinary tract infection) 08/05/2017  . CAP (community acquired pneumonia) 11/29/2016  . Bacteremia 03/14/2016  . HCAP (healthcare-associated pneumonia)   . Pressure ulcer 10/01/2015  . Right lower lobe pneumonia ([place]) 10/01/2015    . Sepsis due to Gram-negative organism with acute respiratory failure ([place]) 10/01/2015  . Acute renal failure ([place]) 10/01/2015  . Acute respiratory failure with hypoxia ([place]) 09/30/2015  . Influenza A (H1N1) 03/25/2015  . Dementia without behavioral disturbance 03/25/2015  . Hyperkalemia 03/25/2015  . Falls 03/16/2015  . Sacral pressure ulcer 03/16/2015  . Fever 03/16/2015  . Memory difficulty 08/21/2014  . Malnutrition of moderate degree ([place]) 04/19/2014  . UTI (lower urinary tract infection) 04/18/2014  . Hypoxia 04/18/2014  . PNA (pneumonia) 04/18/2014  . Chronic diastolic congestive heart failure ([place]) 02/01/2013  . Pericardial effusion 02/01/2013  . Bradycardia 01/31/2013  . Empyema lung ([place]) 12/23/2012  . S/P thoracotomy 12/23/2012  . Abnormality of gait 12/05/2012  . Hypernatremia 08/13/2012  . Hypertension   . Diabetes mellitus, type 2 ([place])   . Arthritis   . Bipolar disorder ([place])   . Anxiety   . Chronic indwelling Foley catheter   . Neuromuscular disorder ([place])   . Bacteremia of undetermined etiology 06/17/2012  . Leukocytosis 04/17/2012  . Parkinson disease ([place]) 04/16/2012  . Elevated troponin 04/16/2012  . Hearing aid worn   . Diabetes mellitus ([place]) 07/06/2011    Past Surgical History:  Procedure Laterality Date  . TOTAL HIP ARTHROPLASTY    . VIDEO ASSISTED THORACOSCOPY (VATS)/EMPYEMA  12/16/2012   Procedure: VIDEO ASSISTED THORACOSCOPY (VATS)/EMPYEMA;  Surgeon: Melrose Nakayama, MD;  Location: [facility];  Service: Thoracic;  Laterality: Right;  Marland Kitchen VIDEO BRONCHOSCOPY  12/16/2012  Procedure: VIDEO BRONCHOSCOPY;  Surgeon: Melrose Nakayama, MD;  Location: [facility];  Service: Thoracic;  Laterality: Right;       Home Medications    Prior to Admission medications   Medication Sig Start Date End Date Taking? Authorizing Provider  acetaminophen (TYLENOL) 325 MG tablet Take 650 mg by mouth every 6 (six) hours as needed for mild pain.   Yes [provider]  aspirin 81 MG chewable tablet Chew 81 mg by mouth daily.   Yes [provider]  belladonna-opium (B&O SUPPRETTES) 16.2-30 MG suppository Place 1 suppository rectally every 8 (eight) hours as needed for pain. 02/07/24  Yes Delora Fuel, MD  bisacodyl (DULCOLAX) 10 MG suppository Place 10 mg rectally as needed for moderate constipation.   Yes [provider]  carbidopa-levodopa (SINEMET IR) 25-250 MG per tablet Take 1 tablet by mouth 3 (three) times daily.   Yes [provider]  ciprofloxacin (CIPRO) 500 MG tablet Take 1 tablet (500 mg total) by mouth 2 (two) times daily. One po bid x 7 days 08/21/17  Yes Street, Clinton, PA-C  clonazePAM (KLONOPIN) 0.5 MG tablet Take 0.5 tablets (0.25 mg total) by mouth 2 (two) times daily as needed (anxiety). 08/08/17  Yes Mariel Aloe, MD  clopidogrel (PLAVIX) 75 MG tablet Take 1 tablet (75 mg total) by mouth daily. Patient taking differently: Take 75 mg by mouth every evening.  10/07/15  Yes Florencia Reasons, MD  entacapone (COMTAN) 200 MG tablet Take 200 mg by mouth 3 (three) times daily.   Yes [provider]  feeding supplement, GLUCERNA SHAKE, (GLUCERNA SHAKE) LIQD Take 237 mLs by mouth 2 (two) times daily between meals. 03/16/16  Yes Kelvin Cellar, MD  ferrous sulfate 325 (65 FE) MG tablet Take 325 mg by mouth 2 (two) times daily.   Yes [provider]  guaifenesin (ROBITUSSIN) 100 MG/5ML syrup Take 200 mg by mouth every 4 (four) hours as needed for cough.    Yes [provider]  hydrALAZINE (APRESOLINE) 25 MG tablet Take 12.5 mg by mouth 2 (two) times daily.    Yes [provider]  isosorbide mononitrate (IMDUR) 30 MG 24 hr tablet Take 1 tablet (30 mg total) by mouth daily. 10/07/15  Yes Florencia Reasons, MD  ketoconazole (NIZORAL) 2 % cream Apply 1 application topically daily as needed (fungal infection).    Yes [provider]  lamoTRIgine (LAMICTAL) 150 MG tablet Take 1 tablet (150  mg total) by mouth 2 (two) times daily. 07/17/17  Yes Arfeen, Arlyce Harman, MD  lisinopril (PRINIVIL,ZESTRIL) 10 MG tablet Take 0.5 tablets (5 mg total) by mouth daily. 10/07/15  Yes Florencia Reasons, MD  loratadine (CLARITIN) 10 MG tablet Take 10 mg by mouth daily as needed for allergies.   Yes [provider]  Multiple Vitamin (MULTIVITAMIN WITH MINERALS) TABS tablet Take 1 tablet by mouth daily.   Yes [provider]  mupirocin ointment (BACTROBAN) 2 % Place 1 application into the nose 2 (two) times daily. 08/08/17  Yes Mariel Aloe, MD  nystatin (MYCOSTATIN/NYSTOP) powder Apply to affected area of skin in genital area three times daily for at least 2 weeks, or until resolution of yeast infection. 08/21/17  Yes Street, Mercedes, PA-C  pantoprazole (PROTONIX) 40 MG tablet Take 40 mg by mouth daily. Reported on 07/06/2016   Yes [provider]  polyethylene glycol (MIRALAX / GLYCOLAX) packet Take 17 g by mouth daily.   Yes [provider]  predniSONE (DELTASONE)  2.5 MG tablet Take 2.5 mg by mouth daily with breakfast.   Yes [provider]  QUEtiapine (SEROQUEL) 25 MG tablet Take 3 tablets (75 mg total) by mouth at bedtime. 07/17/17  Yes Arfeen, Arlyce Harman, MD  rosuvastatin (CRESTOR) 20 MG tablet Take 20 mg by mouth daily.   Yes [provider]  sertraline (ZOLOFT) 100 MG tablet Take 2 tablets (200 mg total) by mouth daily. 07/17/17  Yes Arfeen, Arlyce Harman, MD  Skin Protectants, Misc. (BAZA PROTECT EX) Apply 1 application topically 3 (three) times daily as needed (dermatitis).    Yes [provider]  traZODone (DESYREL) 50 MG tablet Take 1 tablet (50 mg total) by mouth at bedtime. 01/17/17  Yes Arfeen, Arlyce Harman, MD    Family History Family History  Problem Relation Age of Onset  . Depression Father   . Cancer Mother   . Heart disease Mother     Social History Social History  Substance Use Topics  . Smoking status: Never Smoker  . Smokeless tobacco: Never  Used  . Alcohol use No     Comment: occasional     Allergies   Plant sterols and stanols   Review of Systems Review of Systems  Level 5 caveat because of dementia.   Physical Exam Updated Vital Signs BP 138/70   Pulse 60   Temp 98.1 F (36.7 C) (Oral)   Resp 19   SpO2 100%   Physical Exam  Constitutional: He appears well-developed and well-nourished. No distress.  HENT:  Head: Normocephalic and atraumatic.  Eyes: Conjunctivae are normal. Right eye exhibits no discharge. Left eye exhibits no discharge.  Neck: Neck supple.  Cardiovascular: Normal rate, regular rhythm and normal heart sounds.  Exam reveals no gallop and no friction rub.   No murmur heard. Pulmonary/Chest: Effort normal and breath sounds normal. No respiratory distress.  Abdominal: Soft. He exhibits no distension. There is no tenderness.  Genitourinary:  Genitourinary Comments: Abdomen soft. NT. Bladder not distended. Gross blood on foley. Some clots. Small amount of bleeding from urethral meatus around catheter.  Musculoskeletal: He exhibits no edema or tenderness.  Neurological: He is alert.  Skin: Skin is warm and dry.  Psychiatric: He has a normal mood and affect. His behavior is normal. Thought content normal.  Nursing note and vitals reviewed.    ED Treatments / Results  Labs (all labs ordered are listed, but only abnormal results are displayed) Labs Reviewed - No data to display  EKG  EKG Interpretation None       Radiology Ct Renal Stone Study  Result Date: 08/21/2017 CLINICAL DATA:  Hematuria. Chronic Foley catheter, hematuria in Foley bag. Evaluate for resolution of hydronephrosis seen last month. EXAM: CT ABDOMEN AND PELVIS WITHOUT CONTRAST TECHNIQUE: Multidetector CT imaging of the abdomen and pelvis was performed following the standard protocol without IV contrast. COMPARISON:  CT 08/05/2017 FINDINGS: Lower chest: The heart is enlarged. Small pericardial effusion is unchanged. New  small peripheral left lower lobe consolidation with air bronchograms. Motion artifact through the lung bases. Hepatobiliary: No focal hepatic lesion allowing for lack contrast. Gallbladder physiologically distended, no calcified stone. No biliary dilatation. Pancreas: Parenchymal atrophy. No ductal dilatation or inflammation. Mild patient motion artifact through the level pancreas. Spleen: Calcified granuloma. Spleen is upper normal in size spanning 13.3 cm. Adrenals/Urinary Tract: Low-density nodularity of the left adrenal gland likely adenoma. Right adrenal gland is normal. Previous right hydronephrosis is near completely resolved with minimal residual prominence of the renal  pelvis. The ureter is decompressed. Trace perinephric edema, improved from prior exam. No urolithiasis. Left hydronephrosis has resolved since prior exam. The ureter is decompressed. Improved perinephric edema from prior. There is an exophytic 2.1 x 1.9 hyperdense lesion arising from medial left kidney. No urolithiasis. Foley catheter decompressing the urinary bladder which is not well assessed. Perivesicular edema on prior exam has diminished. No bladder stone. Stomach/Bowel: Colonic diverticulosis without inflammation. Moderate diffuse colonic stool burden. No small bowel inflammation or obstruction. Stomach distended with ingested contents. Normal appendix. Vascular/Lymphatic: Aortic and branch atherosclerosis and tortuosity. No aneurysm. Small retroperitoneal nodes are similar to prior exam. No enlarged abdominal or pelvic lymph nodes. Reproductive: Suspect enlarged prostate gland, obscured by streak artifact from bilateral hip replacements. Other: No free air or intra-abdominal ascites. Musculoskeletal: Bilateral hip arthroplasties. Scoliosis and degenerative change in the spine. Stable sclerotic lesion in the right iliac bone. There are no acute or suspicious osseous abnormalities. IMPRESSION: 1. Resolved left hydronephrosis, near  completely resolved right hydronephrosis from recent exam. Improved perinephric edema. Foley catheter in the bladder with decrease perivesicular soft tissue stranding. No urolithiasis. 2. Exophytic lesion from the medial left kidney measuring 2.1 x 1.9 cm. This is unchanged from recent exam, no more remote exams are available for comparison. Recommend further characterization with renal protocol CT or MRI without and with contrast. 3. New left lower lobe opacity in the included lung bases with air bronchograms, atelectasis versus pneumonia. 4. Colonic diverticulosis without inflammation. Again seen cardiomegaly and small pericardial effusion. Aortic Atherosclerosis (ICD10-I70.0). Electronically Signed   By: Jeb Levering M.D.   On: 08/21/2017 18:40    Procedures Procedures (including critical care time)  Medications Ordered in ED Medications - No data to display   Initial Impression / Assessment and Plan / ED Course  I have reviewed the triage vital signs and the nursing notes.  Pertinent labs & imaging results that were available during my care of the patient were reviewed by me and considered in my medical decision making (see chart for details).     Continued hematuria. Foley still functioning though. Irrigated. No abdominal tenderness or distended bladder. CT from yesterday reviewed. H/H ok. HD stable. Urology FU.  Social work spoke with [employer]. Resources provided as she is actively looking for placement in different facility.   Final Clinical Impressions(s) / ED Diagnoses   Final diagnoses:  Hematuria, unspecified type    New Prescriptions New Prescriptions   No medications on file     Virgel Manifold, MD 08/23/17 1154

## 2017-08-22 NOTE — Progress Notes (Addendum)
3PM:CSW spoke with POA [name] regarding discharge plans. [name] voiced concerns with patient returning to [facility] and was currently seeking new placement [facility](or other ALF's). CSW validated Gloria's concerns and will provide her with information with ALF's in the area and information for "[facility]"- a free service for finding assisted living facilities in the area. [name] appreciated CSW and understood that if patient was medically cleared would be discharged to [facility]. CSW confirmed with [name] that patient is able to return with Home Health.   Plan: Once stable for discharge patient will be transported via PTAR back to [facility].    CSW attempted to contact patients POA, [name] [phone], with no answer. CSW will continue to reach out.   Kingsley Spittle, [facility] Emergency Room Clinical Social Worker [phone]

## 2017-08-22 NOTE — ED Triage Notes (Signed)
Pt represents for hematuria.  Per PTAR staff at [facility] refused to accept the pt d/t the amount of hematuria.

## 2017-08-22 NOTE — Care Management Note (Signed)
Case Management Note  Patient Details  Name: [name]. MRN: [id number] Date of Birth: 10-Oct-1935  Subjective/Objective:  82 y.o. male with a PMHx of BPH, chronic indwelling foley catheter with recurrent UTIs, DM2, HTN, parkinson's, dementia, dCHF, and other medical conditions listed below, who presents to the ED via EMS from the [facility] with continued foley complaints.                 Action/Plan: Dr Wilson Singer consulted CM after medical examination and speaking with POA.  Apparently POA is out looking at SNFs at this time.  Per EDP plan is to D/C to SNF from ED.  Advised him to consult CSW on process.  Will update [facility].  No further CM needs noted at this time.  Expected Discharge Date:   08/22/2017               Expected Discharge Plan:  Assisted Living / Rest Home  In-House Referral:  Clinical Social Work  Discharge planning Services  CM Consult  Post Acute Care Choice:    Choice offered to:  [place] / [place]:  [facility]  Status of Service:  Completed, signed off  Corky Crafts, RN 08/22/2017, 12:37 PM

## 2017-08-22 NOTE — ED Notes (Signed)
Bed: JQ30 Expected date:  Expected time:  Means of arrival:  Comments: hematuria

## 2017-08-23 ENCOUNTER — Emergency Department (HOSPITAL_COMMUNITY)
Admission: EM | Admit: 2017-08-23 | Discharge: 2017-08-23 | Disposition: A | Discharge status: Home / Self Care | Payer: Medicare Other | Attending: Emergency Medicine | Admitting: Emergency Medicine

## 2017-08-23 ENCOUNTER — Encounter (HOSPITAL_COMMUNITY): Payer: Self-pay | Admitting: Emergency Medicine

## 2017-08-23 ENCOUNTER — Telehealth: Payer: Self-pay | Admitting: Neurology

## 2017-08-23 ENCOUNTER — Ambulatory Visit: Payer: Medicare Other | Admitting: Neurology

## 2017-08-23 DIAGNOSIS — G20 Parkinson's disease: Secondary | ICD-10-CM | POA: Insufficient documentation

## 2017-08-23 DIAGNOSIS — E119 Type 2 diabetes mellitus without complications: Secondary | ICD-10-CM | POA: Insufficient documentation

## 2017-08-23 DIAGNOSIS — Z79899 Other long term (current) drug therapy: Secondary | ICD-10-CM | POA: Insufficient documentation

## 2017-08-23 DIAGNOSIS — I10 Essential (primary) hypertension: Secondary | ICD-10-CM | POA: Insufficient documentation

## 2017-08-23 DIAGNOSIS — Z7982 Long term (current) use of aspirin: Secondary | ICD-10-CM | POA: Insufficient documentation

## 2017-08-23 DIAGNOSIS — Z7902 Long term (current) use of antithrombotics/antiplatelets: Secondary | ICD-10-CM | POA: Insufficient documentation

## 2017-08-23 DIAGNOSIS — I5032 Chronic diastolic (congestive) heart failure: Secondary | ICD-10-CM | POA: Insufficient documentation

## 2017-08-23 DIAGNOSIS — Z96649 Presence of unspecified artificial hip joint: Secondary | ICD-10-CM | POA: Insufficient documentation

## 2017-08-23 DIAGNOSIS — N3091 Cystitis, unspecified with hematuria: Secondary | ICD-10-CM | POA: Insufficient documentation

## 2017-08-23 LAB — URINE CULTURE: Culture: >=100000 — AB

## 2017-08-23 LAB — I-STAT CHEM 8, ED
BUN: 26 mg/dL — ABNORMAL HIGH (ref 6–20)
Calcium, Ion: 1.16 mmol/L (ref 1.15–1.40)
Chloride: 103 mmol/L (ref 101–111)
Creatinine, Ser: 1.2 mg/dL (ref 0.61–1.24)
Glucose, Bld: 199 mg/dL — ABNORMAL HIGH (ref 65–99)
HEMATOCRIT: 31 % — AB (ref 39.0–52.0)
HEMOGLOBIN: 10.5 g/dL — AB (ref 13.0–17.0)
POTASSIUM: 4.7 mmol/L (ref 3.5–5.1)
Sodium: 137 mmol/L (ref 135–145)
TCO2: 22 mmol/L (ref 22–32)

## 2017-08-23 MED ORDER — DOXYCYCLINE HYCLATE 100 MG PO CAPS: 100.0000 mg | ORAL_CAPSULE | Freq: Two times a day (BID) | ORAL | 0 refills | Status: DC

## 2017-08-23 MED ORDER — CEFTRIAXONE SODIUM 1 G IJ SOLR: 1.0000 g | Freq: Once | INTRAMUSCULAR | Status: DC

## 2017-08-23 MED ORDER — CEPHALEXIN 500 MG PO CAPS: 500.0000 mg | ORAL_CAPSULE | Freq: Two times a day (BID) | ORAL | 0 refills | Status: DC

## 2017-08-23 MED ORDER — DEXTROSE 5 % IV SOLN
2.0000 g | Freq: Once | INTRAVENOUS | Status: AC
  Administered 2017-08-23: 2 g via INTRAVENOUS
  Filled 2017-08-23: qty 2

## 2017-08-23 MED ORDER — CEFTRIAXONE SODIUM 1 G IJ SOLR
500.0000 mg | Freq: Once | INTRAMUSCULAR | Status: AC
  Administered 2017-08-23: 500 mg via INTRAMUSCULAR
  Filled 2017-08-23: qty 10

## 2017-08-23 MED ORDER — LIDOCAINE HCL (PF) 1 % IJ SOLN
INTRAMUSCULAR | Status: AC
  Administered 2017-08-23: 30 mL
  Filled 2017-08-23: qty 30

## 2017-08-23 NOTE — ED Notes (Signed)
Patient actively bleeding around foley catheter. MD made aware.

## 2017-08-23 NOTE — ED Notes (Addendum)
Foley catheter flushed with 553ml. MD made aware.

## 2017-08-23 NOTE — Care Management Note (Signed)
Case Management Note  CM noted that pt has been back x2 to the ED since last review.  Dr. Ashok Cordia states the last several ED visits have been staff driven not family driven.  Family currently at bedside.  Contacted Mary and Tim with [facility] who states he is going to go to [facility] in -person and help educate the staff on the appropriate reasons for the pt to return to the ED.  They additionally advised that the [facility] RN will not come to see the pt the same day he has been in the ED or hospital.  CM will continue to follow as needed.

## 2017-08-23 NOTE — ED Notes (Signed)
[facility] called for transport.  

## 2017-08-23 NOTE — ED Notes (Signed)
Family at bedside. 

## 2017-08-23 NOTE — ED Notes (Addendum)
Patient's family made aware patient will be discharged and transported back to [facility].

## 2017-08-23 NOTE — ED Notes (Signed)
Patient placed on bedpan.

## 2017-08-23 NOTE — Telephone Encounter (Signed)
Rhonda with [name] called to advise patient is in hospital and will not be making appointment today with Dr. Jannifer Franklin.  Patient has been removed from the schedule and no show fee will not be charged. FYI

## 2017-08-23 NOTE — ED Provider Notes (Addendum)
  Physical Exam  BP (!) 132/94 (BP Location: Left Arm)   Pulse 74   Temp 98.7 F (37.1 C) (Oral)   Resp 18   SpO2 96%   Physical Exam  ED Course  Procedures  MDM  [name] comes in with gross hematuria. [name] is here from SNF. Urology has seen the patient, and they suspect that his bleed is from new foley catheter that was placed few days back. [name] is on plavix. [name] has a 3 way catheter in place and irrigation has been started in the ER. SNF sent the patient to the ER due to concerns of the foley catheter being clogged. No signs of obstruction in the ER per Dr. Tyrone Nine.  D/C to [facility] after irrigation is completed.    Varney Biles, MD 08/23/17 0003   4:09 AM Patient continues to have gross hematuria. He has 24 Ft foley, and the bladder is irrigating normally. Spoke with Dr. Matilde Sprang, no further recs from Urology, and I agree - we will call SNF ion the AM.   Varney Biles, MD 08/23/17 0410

## 2017-08-23 NOTE — ED Notes (Signed)
Patient yelling at staff stating "I am ready to go, you all are not helping me." Patient made aware transport has been called and offered food.

## 2017-08-23 NOTE — ED Notes (Signed)
Bed: WA07 Expected date:  Expected time:  Means of arrival:  Comments: 82 yo hematuria

## 2017-08-23 NOTE — ED Notes (Signed)
PTAR callled by Santiago Glad.

## 2017-08-23 NOTE — Discharge Instructions (Signed)
Mr. Lachapelle was seen by the Urologist for his hematuria / bloody urine. He likely has bladder infection. He has received antibiotics for it in the ER, and has been given prescription.  Please continue with the prescription. Please send him to the ER if patient stops making urine or has worsening abdominal pain. Please send him to the Urologist in their clinic.

## 2017-08-23 NOTE — ED Triage Notes (Signed)
Per EMS, patient from [facility], c/o hematuria x2 days. Seen 3x in past two days for same. Chronic foley with bright red blood in drainage bag. Hx parkinson's and dementia. Baseline per staff. Diagnosed with UTI.  BP 148/82 HR 80 CBG 252

## 2017-08-23 NOTE — ED Provider Notes (Signed)
[facility] DEPT Provider Note   CSN: [id number] Arrival date & time: 08/23/17  1130     History   Chief Complaint Chief Complaint  Patient presents with  . Hematuria    HPI Dwayne Riel. is a 82 y.o. male.  Patient presents from ecf with hematuria. Hx same. Multiple recent ED visits for same.  Pt/family indicate staff at facility indicate they do not know how to manage catheter with blood in urine.  No new or different symptoms. No fever or chills. No vomiting. No abdominal pain. Catheter continues to drain, and good amount urine in tube and in bag.  No other abnormal bruising or bleeding. No faintness.    The history is provided by the patient.  Hematuria  Pertinent negatives include no abdominal pain and no shortness of breath.    Past Medical History:  Diagnosis Date  . Anxiety   . Arthritis   . Benign prostate hyperplasia   . Bipolar disorder ([place])   . C2 cervical fracture ([name])    due to pt fall  . Chronic indwelling Foley catheter   . Depression   . Diabetes mellitus type II   . Falls   . Flu   . Hearing aid worn   . Hypertension   . Memory difficulty 08/21/2014  . Memory loss   . MRSA infection (methicillin-resistant Staphylococcus aureus)   . Neuromuscular disorder ([place])    parkinsons  . Parkinson disease ([place])   . Pneumonia   . SIRS (systemic inflammatory response syndrome) ([name])   . UTI (urinary tract infection)     Patient Active Problem List   Diagnosis Date Noted  . 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 Pyelonephritis 08/06/2017  . Contusion of head 08/06/2017  . Pressure injury of skin 08/06/2017  . Complicated UTI (urinary tract infection) 08/05/2017  . CAP (community acquired pneumonia) 11/29/2016  . Bacteremia 03/14/2016  . HCAP (healthcare-associated pneumonia)   . Pressure ulcer 10/01/2015  . Right lower lobe pneumonia ([place]) 10/01/2015  . Sepsis due to Gram-negative organism with  acute respiratory failure ([place]) 10/01/2015  . Acute renal failure ([place]) 10/01/2015  . Acute respiratory failure with hypoxia ([place]) 09/30/2015  . Influenza A (H1N1) 03/25/2015  . Dementia without behavioral disturbance 03/25/2015  . Hyperkalemia 03/25/2015  . Falls 03/16/2015  . Sacral pressure ulcer 03/16/2015  . Fever 03/16/2015  . Memory difficulty 08/21/2014  . Malnutrition of moderate degree ([place]) 04/19/2014  . UTI (lower urinary tract infection) 04/18/2014  . Hypoxia 04/18/2014  . PNA (pneumonia) 04/18/2014  . Chronic diastolic congestive heart failure ([place]) 02/01/2013  . Pericardial effusion 02/01/2013  . Bradycardia 01/31/2013  . Empyema lung ([place]) 12/23/2012  . S/P thoracotomy 12/23/2012  . Abnormality of gait 12/05/2012  . Hypernatremia 08/13/2012  . Hypertension   . Diabetes mellitus, type 2 ([place])   . Arthritis   . Bipolar disorder ([place])   . Anxiety   . Chronic indwelling Foley catheter   . Neuromuscular disorder ([place])   . Bacteremia of undetermined etiology 06/17/2012  . Leukocytosis 04/17/2012  . Parkinson disease ([place]) 04/16/2012  . Elevated troponin 04/16/2012  . Hearing aid worn   . Diabetes mellitus ([place]) 07/06/2011    Past Surgical History:  Procedure Laterality Date  . TOTAL HIP ARTHROPLASTY    . VIDEO ASSISTED THORACOSCOPY (VATS)/EMPYEMA  12/16/2012   Procedure: VIDEO ASSISTED THORACOSCOPY (VATS)/EMPYEMA;  Surgeon: Melrose Nakayama, MD;  Location: [facility];  Service: Thoracic;  Laterality: Right;  Marland Kitchen VIDEO BRONCHOSCOPY  12/16/2012   Procedure: VIDEO BRONCHOSCOPY;  Surgeon: Melrose Nakayama, MD;  Location: [facility];  Service: Thoracic;  Laterality: Right;       Home Medications    Prior to Admission medications   Medication Sig Start Date End Date Taking? Authorizing Provider  acetaminophen (TYLENOL) 325 MG tablet Take 650 mg by mouth every 6 (six) hours as needed for mild pain.   Yes [provider]  aspirin 81 MG chewable tablet Chew  81 mg by mouth daily.   Yes [provider]  belladonna-opium (B&O SUPPRETTES) 16.2-30 MG suppository Place 1 suppository rectally every 8 (eight) hours as needed for pain. 07/06/93  Yes Delora Fuel, MD  bisacodyl (DULCOLAX) 10 MG suppository Place 10 mg rectally as needed for moderate constipation.   Yes [provider]  carbidopa-levodopa (SINEMET IR) 25-250 MG per tablet Take 1 tablet by mouth 3 (three) times daily.   Yes [provider]  ciprofloxacin (CIPRO) 500 MG tablet Take 1 tablet (500 mg total) by mouth 2 (two) times daily. One po bid x 7 days 08/21/17  Yes Street, [place], PA-C  clonazePAM (KLONOPIN) 0.5 MG tablet Take 0.5 tablets (0.25 mg total) by mouth 2 (two) times daily as needed (anxiety). 08/08/17  Yes Mariel Aloe, MD  clopidogrel (PLAVIX) 75 MG tablet Take 1 tablet (75 mg total) by mouth daily. Patient taking differently: Take 75 mg by mouth every evening.  10/07/15  Yes Florencia Reasons, MD  entacapone (COMTAN) 200 MG tablet Take 200 mg by mouth 3 (three) times daily.   Yes [provider]  feeding supplement, GLUCERNA SHAKE, (GLUCERNA SHAKE) LIQD Take 237 mLs by mouth 2 (two) times daily between meals. 03/16/16  Yes Kelvin Cellar, MD  ferrous sulfate 325 (65 FE) MG tablet Take 325 mg by mouth 2 (two) times daily.   Yes [provider]  guaifenesin (ROBITUSSIN) 100 MG/5ML syrup Take 200 mg by mouth every 4 (four) hours as needed for cough.    Yes [provider]  hydrALAZINE (APRESOLINE) 25 MG tablet Take 12.5 mg by mouth 2 (two) times daily.    Yes [provider]  isosorbide mononitrate (IMDUR) 30 MG 24 hr tablet Take 1 tablet (30 mg total) by mouth daily. 10/07/15  Yes Florencia Reasons, MD  ketoconazole (NIZORAL) 2 % cream Apply 1 application topically daily as needed (fungal infection).    Yes [provider]  lamoTRIgine (LAMICTAL) 150 MG tablet Take 1 tablet (150 mg total) by mouth 2 (two) times daily. 07/17/17  Yes  Arfeen, Arlyce Harman, MD  loratadine (CLARITIN) 10 MG tablet Take 10 mg by mouth daily as needed for allergies.   Yes [provider]  Multiple Vitamin (MULTIVITAMIN WITH MINERALS) TABS tablet Take 1 tablet by mouth daily.   Yes [provider]  nystatin (MYCOSTATIN/NYSTOP) powder Apply to affected area of skin in genital area three times daily for at least 2 weeks, or until resolution of yeast infection. 08/21/17  Yes Street, Mercedes, PA-C  pantoprazole (PROTONIX) 40 MG tablet Take 40 mg by mouth daily. Reported on 07/06/2016   Yes [provider]  polyethylene glycol (MIRALAX / GLYCOLAX) packet Take 17 g by mouth daily.   Yes [provider]  predniSONE (DELTASONE) 2.5 MG tablet Take 2.5 mg by mouth daily with breakfast.   Yes [provider]  QUEtiapine (SEROQUEL) 25 MG tablet Take 3 tablets (75 mg total) by mouth at bedtime. 07/17/17  Yes Arfeen, Arlyce Harman, MD  rosuvastatin (CRESTOR) 20 MG tablet Take 20 mg by mouth daily.   Yes [provider]  sertraline (ZOLOFT) 100 MG tablet Take 2 tablets (200 mg total) by mouth daily. 07/17/17  Yes Arfeen, Arlyce Harman, MD  Skin Protectants, Misc. (BAZA PROTECT EX) Apply 1 application topically 2 (two) times daily as needed (dermatitis).    Yes [provider]  traZODone (DESYREL) 50 MG tablet Take 1 tablet (50 mg total) by mouth at bedtime. 01/17/17  Yes Arfeen, Arlyce Harman, MD  cephALEXin (KEFLEX) 500 MG capsule Take 1 capsule (500 mg total) by mouth 2 (two) times daily. 08/23/17   Varney Biles, MD  lisinopril (PRINIVIL,ZESTRIL) 10 MG tablet Take 0.5 tablets (5 mg total) by mouth daily. Patient not taking: Reported on 08/22/2017 10/07/15   Florencia Reasons, MD  mupirocin ointment (BACTROBAN) 2 % Place 1 application into the nose 2 (two) times daily. Patient not taking: Reported on 08/23/2017 08/08/17   Mariel Aloe, MD    Family History Family History  Problem Relation Age of Onset  . Depression Father   . Cancer Mother    . Heart disease Mother     Social History Social History  Substance Use Topics  . Smoking status: Never Smoker  . Smokeless tobacco: Never Used  . Alcohol use No     Comment: occasional     Allergies   Plant sterols and stanols   Review of Systems Review of Systems  Constitutional: Negative for chills and fever.  Respiratory: Negative for shortness of breath.   Gastrointestinal: Negative for abdominal distention, abdominal pain and vomiting.  Genitourinary: Positive for hematuria. Negative for dysuria.     Physical Exam Updated Vital Signs BP 129/75   Pulse 75   Temp 97.7 F (36.5 C) (Oral)   Resp 18   SpO2 98%   Physical Exam  Constitutional: He appears well-developed and well-nourished. No distress.  HENT:  Head: Atraumatic.  Eyes: Conjunctivae are normal.  Neck: Neck supple. No tracheal deviation present.  Cardiovascular: Normal rate.   Pulmonary/Chest: Effort normal. No accessory muscle usage. No respiratory distress.  Abdominal: He exhibits no distension. There is no tenderness.  Genitourinary:  Genitourinary Comments: Normal external exam.  Blood tinged urine in tube and in bag.   Musculoskeletal: He exhibits no edema.  Neurological: He is alert.  Skin: Skin is warm and dry. He is not diaphoretic.  Psychiatric: He has a normal mood and affect.  Nursing note and vitals reviewed.    ED Treatments / Results  Labs (all labs ordered are listed, but only abnormal results are displayed) Labs Reviewed - No data to display  EKG  EKG Interpretation None       Radiology Ct Renal Stone Study  Result Date: 08/21/2017 CLINICAL DATA:  Hematuria. Chronic Foley catheter, hematuria in Foley bag. Evaluate for resolution of hydronephrosis seen last month. EXAM: CT ABDOMEN AND PELVIS WITHOUT CONTRAST TECHNIQUE: Multidetector CT imaging of the abdomen and pelvis was performed following the standard protocol without IV contrast. COMPARISON:  CT 08/05/2017  FINDINGS: Lower chest: The heart is enlarged. Small pericardial effusion is unchanged. New small peripheral left lower lobe consolidation with air bronchograms. Motion artifact through the lung bases. Hepatobiliary: No focal hepatic lesion allowing for lack contrast. Gallbladder physiologically distended, no calcified stone. No biliary dilatation. Pancreas: Parenchymal atrophy. No ductal dilatation or inflammation. Mild patient motion artifact through the level pancreas. Spleen: Calcified granuloma. Spleen is upper normal in size  spanning 13.3 cm. Adrenals/Urinary Tract: Low-density nodularity of the left adrenal gland likely adenoma. Right adrenal gland is normal. Previous right hydronephrosis is near completely resolved with minimal residual prominence of the renal pelvis. The ureter is decompressed. Trace perinephric edema, improved from prior exam. No urolithiasis. Left hydronephrosis has resolved since prior exam. The ureter is decompressed. Improved perinephric edema from prior. There is an exophytic 2.1 x 1.9 hyperdense lesion arising from medial left kidney. No urolithiasis. Foley catheter decompressing the urinary bladder which is not well assessed. Perivesicular edema on prior exam has diminished. No bladder stone. Stomach/Bowel: Colonic diverticulosis without inflammation. Moderate diffuse colonic stool burden. No small bowel inflammation or obstruction. Stomach distended with ingested contents. Normal appendix. Vascular/Lymphatic: Aortic and branch atherosclerosis and tortuosity. No aneurysm. Small retroperitoneal nodes are similar to prior exam. No enlarged abdominal or pelvic lymph nodes. Reproductive: Suspect enlarged prostate gland, obscured by streak artifact from bilateral hip replacements. Other: No free air or intra-abdominal ascites. Musculoskeletal: Bilateral hip arthroplasties. Scoliosis and degenerative change in the spine. Stable sclerotic lesion in the right iliac bone. There are no acute  or suspicious osseous abnormalities. IMPRESSION: 1. Resolved left hydronephrosis, near completely resolved right hydronephrosis from recent exam. Improved perinephric edema. Foley catheter in the bladder with decrease perivesicular soft tissue stranding. No urolithiasis. 2. Exophytic lesion from the medial left kidney measuring 2.1 x 1.9 cm. This is unchanged from recent exam, no more remote exams are available for comparison. Recommend further characterization with renal protocol CT or MRI without and with contrast. 3. New left lower lobe opacity in the included lung bases with air bronchograms, atelectasis versus pneumonia. 4. Colonic diverticulosis without inflammation. Again seen cardiomegaly and small pericardial effusion. Aortic Atherosclerosis (ICD10-I70.0). Electronically Signed   By: Jeb Levering M.D.   On: 08/21/2017 18:40    Procedures Procedures (including critical care time)  Medications Ordered in ED Medications  ceFEPIme (MAXIPIME) 1 g in dextrose 5 % 50 mL IVPB (not administered)     Initial Impression / Assessment and Plan / ED Course  I have reviewed the triage vital signs and the nursing notes.  Pertinent labs & imaging results that were available during my care of the patient were reviewed by me and considered in my medical decision making (see chart for details).  Reviewed nursing notes and prior charts for additional history.   Pt in ED with same earlier today - urology consulted on patient today - they indicated to d/c back to ecf with foley, abx, and can f/u in office.   Recent cx positive for pseudomonas.  Pt does not appear systemically acutely ill. No fever. No n/v. No weakness/faintness. Cefepime iv. Will add doxy po.   Discussed with CM/SW - they will send staff to ecf to educate on management of foley. No new or specific rx is required for ecf.     Final Clinical Impressions(s) / ED Diagnoses   Final diagnoses:  None    New Prescriptions New  Prescriptions   No medications on file     Lajean Saver, MD 08/23/17 1252

## 2017-08-23 NOTE — Discharge Instructions (Signed)
It was our pleasure to provide your ER care today - we hope that you feel better.  Drink plenty of fluids.   Take additional antibiotic (doxycycline) as prescribed.  Follow up with urologist in 1 week - call office to verify appointment time.  It is okay if there is blood and or clotted blood in bag.  Return to ER if worse, new symptoms, high fevers, catheter not draining, severe abdominal pain, other concern.

## 2017-08-23 NOTE — ED Notes (Signed)
Attempted to call [name] to give report with no answer.

## 2017-08-24 ENCOUNTER — Telehealth: Payer: Self-pay | Admitting: *Deleted

## 2017-08-24 MED ORDER — FOSFOMYCIN TROMETHAMINE 3 G PO PACK: 3.0000 g | PACK | Freq: Every day | ORAL | 0 refills | Status: AC

## 2017-08-24 NOTE — ED Provider Notes (Signed)
MSE was initiated and I personally evaluated the patient and placed orders (if any) at  12:52 PM on September 74, 8551.  82 year old male with a history of a chronic Foley catheter who was evaluated on 08/21/17 and his urine culture grew greater than 100,000 colonies of Pseudomonas Aeruginosa that was resistant to Cipro. I was asked by pharmacy to review the patient's urine culture to determine if additional treatment was necessary.  The patient was seen and evaluated twice in the emergency department on 08/23/2017 with continued hematuria and dysuria. No constitutional symptoms at that time. He was given 1 dose of cefepime in the emergency department, but was not discharged home with antibiotics.  Hospital staff spoke with his nursing facility earlier today who stated that the patient was still having symptoms that had not improved. Pharmacy recommended fosfomycin. Will provide the patient with a 3 day course of fosfomycin for complicated urinary tract infection since the patient is having urinary symptoms, despite having a chronic Foley catheter, which increases his changes of being colonized. Strict return precautions given if the patient's symptoms do not improve.   Joanne Gavel, PA-C 08/24/17 1253    Milton Ferguson, MD 08/27/17 [phone]

## 2017-08-24 NOTE — Progress Notes (Signed)
ED Antimicrobial Stewardship Positive Culture Follow Up   [name]. is an 82 y.o. male who presented to [facility] on 08/23/2017 with a chief complaint of  Chief Complaint  Patient presents with  . Hematuria    Recent Results (from the past 720 hour(s))  Blood culture (routine x 2)     Status: None   Collection Time: 08/05/17  6:06 PM  Result Value Ref Range Status   Specimen Description BLOOD LEFT ANTECUBITAL  Final   Special Requests   Final    BOTTLES DRAWN AEROBIC AND ANAEROBIC Blood Culture adequate volume   Culture NO GROWTH 5 DAYS  Final   Report Status 08/11/2017 FINAL  Final  Blood culture (routine x 2)     Status: Abnormal   Collection Time: 08/05/17  7:43 PM  Result Value Ref Range Status   Specimen Description BLOOD LEFT HAND  Final   Special Requests   Final    BOTTLES DRAWN AEROBIC AND ANAEROBIC Blood Culture adequate volume   Culture  Setup Time   Final    GRAM POSITIVE COCCI IN CLUSTERS AEROBIC BOTTLE ONLY CRITICAL RESULT CALLED TO, READ BACK BY AND VERIFIED WITH: [place], [place] 08/07/17 0652 L.CHAMPION    Culture (A)  Final    STAPHYLOCOCCUS SPECIES (COAGULASE NEGATIVE) THE SIGNIFICANCE OF ISOLATING THIS ORGANISM FROM A SINGLE SET OF BLOOD CULTURES WHEN MULTIPLE SETS ARE DRAWN IS UNCERTAIN. PLEASE NOTIFY THE MICROBIOLOGY DEPARTMENT WITHIN ONE WEEK IF SPECIATION AND SENSITIVITIES ARE REQUIRED. Performed at [facility], [place] [address] [place], [place] [address]    Report Status 08/09/2017 FINAL  Final  Blood Culture ID Panel (Reflexed)     Status: Abnormal   Collection Time: 08/05/17  7:43 PM  Result Value Ref Range Status   Enterococcus species NOT DETECTED NOT DETECTED Final   Vancomycin resistance NOT DETECTED NOT DETECTED Final   Listeria monocytogenes NOT DETECTED NOT DETECTED Final   Staphylococcus species DETECTED (A) NOT DETECTED Final    Comment: CRITICAL RESULT CALLED TO, READ BACK BY AND VERIFIED WITH: [place], PHARMD  08/07/17 0652 L.CHAMPION    Staphylococcus aureus NOT DETECTED NOT DETECTED Final   Methicillin resistance DETECTED (A) NOT DETECTED Final    Comment: CRITICAL RESULT CALLED TO, READ BACK BY AND VERIFIED WITH: [place], PHARMD 08/07/17 0652 L.CHAMPION    Streptococcus species NOT DETECTED NOT DETECTED Final   Streptococcus agalactiae NOT DETECTED NOT DETECTED Final   Streptococcus pneumoniae NOT DETECTED NOT DETECTED Final   Streptococcus pyogenes NOT DETECTED NOT DETECTED Final   Acinetobacter baumannii NOT DETECTED NOT DETECTED Final   Enterobacteriaceae species NOT DETECTED NOT DETECTED Final   Enterobacter cloacae complex NOT DETECTED NOT DETECTED Final   Escherichia coli NOT DETECTED NOT DETECTED Final   Klebsiella oxytoca NOT DETECTED NOT DETECTED Final   Klebsiella pneumoniae NOT DETECTED NOT DETECTED Final   Proteus species NOT DETECTED NOT DETECTED Final   Serratia marcescens NOT DETECTED NOT DETECTED Final   Carbapenem resistance NOT DETECTED NOT DETECTED Final   Haemophilus influenzae NOT DETECTED NOT DETECTED Final   Neisseria meningitidis NOT DETECTED NOT DETECTED Final   Pseudomonas aeruginosa NOT DETECTED NOT DETECTED Final   Candida albicans NOT DETECTED NOT DETECTED Final   Candida glabrata NOT DETECTED NOT DETECTED Final   Candida krusei NOT DETECTED NOT DETECTED Final   Candida parapsilosis NOT DETECTED NOT DETECTED Final   Candida tropicalis NOT DETECTED NOT DETECTED Final    Comment: Performed at [facility], [address] [place], [place] [address]  Urine Culture     Status: Abnormal   Collection Time: 08/05/17  7:45 PM  Result Value Ref Range Status   Specimen Description URINE, CATHETERIZED  Final   Special Requests NONE  Final   Culture MULTIPLE SPECIES PRESENT, SUGGEST RECOLLECTION (A)  Final   Report Status 08/08/2017 FINAL  Final  MRSA PCR Screening     Status: Abnormal   Collection Time: 08/05/17  8:54 PM  Result Value Ref Range Status    MRSA by PCR POSITIVE (A) NEGATIVE Final    Comment:        The GeneXpert MRSA Assay (FDA approved for NASAL specimens only), is one component of a comprehensive MRSA colonization surveillance program. It is not intended to diagnose MRSA infection nor to guide or monitor treatment for MRSA infections. RESULT CALLED TO, READ BACK BY AND VERIFIED WITH: L H[place]FIELD AT 2343 ON 08.19.2018 BY NBROOKS   Culture, blood (routine x 2)     Status: None   Collection Time: 08/07/17  8:08 AM  Result Value Ref Range Status   Specimen Description BLOOD RIGHT HAND  Final   Special Requests IN PEDIATRIC BOTTLE Blood Culture adequate volume  Final   Culture   Final    NO GROWTH 5 DAYS Performed at [facility], [place] [address], [place], [place] [address]    Report Status 08/12/2017 FINAL  Final  Culture, blood (routine x 2)     Status: None   Collection Time: 08/07/17  9:33 AM  Result Value Ref Range Status   Specimen Description BLOOD RIGHT HAND  Final   Special Requests IN PEDIATRIC BOTTLE Blood Culture adequate volume  Final   Culture   Final    NO GROWTH 5 DAYS Performed at [facility], [place] [address] [place], [place] [address]    Report Status 08/12/2017 FINAL  Final  Urine culture     Status: Abnormal   Collection Time: 08/21/17  5:33 PM  Result Value Ref Range Status   Specimen Description URINE, RANDOM  Final   Special Requests NONE  Final   Culture >=100,000 COLONIES/mL PSEUDOMONAS AERUGINOSA (A)  Final   Report Status 08/23/2017 FINAL  Final   Organism ID, Bacteria PSEUDOMONAS AERUGINOSA (A)  Final      Susceptibility   Pseudomonas aeruginosa - MIC*    CEFTAZIDIME <=1 SENSITIVE Sensitive     CIPROFLOXACIN >=4 RESISTANT Resistant     GENTAMICIN <=1 SENSITIVE Sensitive     IMIPENEM 1 SENSITIVE Sensitive     PIP/TAZO <=4 SENSITIVE Sensitive     CEFEPIME <=1 SENSITIVE Sensitive     * >=100,000 COLONIES/mL PSEUDOMONAS AERUGINOSA    [x]  Treated with ciprofloxacin,  organism resistant to prescribed antimicrobial  Difficult as pseudomonas may just be colonizer with indwelling foley cath but did report symptoms. Flow manager to call and see if patient still reporting urinary symptoms. Seemed to be stable when discharged on 9/6 with only hematuria as an issue. Would stop ciprofloxacin and start fosfomycin 3g PO Q72h x 3 doses. Patient should be instructed if symptoms progressively get worse or if patient has any fever or chills he should come back to the hospital as may need an IV antibiotic.  ED Provider: Joline Maxcy PA-C   Reginia Naas 08/24/2017, 9:37 AM Infectious Diseases Pharmacist Phone# [phone]

## 2017-08-24 NOTE — Telephone Encounter (Signed)
Post ED Visit - Positive Culture Follow-up: Successful Patient Follow-Up  Culture assessed and recommendations reviewed by: [x]  Elenor Quinones, Pharm.D. []  Heide Guile, Pharm.D., BCPS AQ-ID []  Parks Neptune, Pharm.D., BCPS []  Alycia Rossetti, Pharm.D., BCPS []  [place], Pharm.D., BCPS, AAHIVP []  Legrand Como, Pharm.D., BCPS, AAHIVP []  Salome Arnt, PharmD, BCPS []  Dimitri Ped, PharmD, BCPS []  Vincenza Hews, PharmD, BCPS  Positive urine culture  []  Patient discharged without antimicrobial prescription and treatment is now indicated [x]  Organism is resistant to prescribed ED discharge antimicrobial []  Patient with positive blood cultures  Changes discussed with ED provider, Joline Maxcy, PA-C New antibiotic prescription Fosfomycin 3g PO q72hrs x 3 doses Faxed to [facility], fax #[phone]  Hubbard Hartshorn at [facility], date 08/24/2017, time [place], [place] 08/24/2017, 12:59 PM

## 2017-08-30 ENCOUNTER — Ambulatory Visit (INDEPENDENT_AMBULATORY_CARE_PROVIDER_SITE_OTHER): Payer: Medicare Other | Admitting: Podiatry

## 2017-08-30 ENCOUNTER — Encounter: Payer: Self-pay | Admitting: Podiatry

## 2017-08-30 DIAGNOSIS — B351 Tinea unguium: Secondary | ICD-10-CM

## 2017-08-30 DIAGNOSIS — R52 Pain, unspecified: Secondary | ICD-10-CM

## 2017-08-30 NOTE — Progress Notes (Signed)
Subjective: 82 y.o. returns the office today for painful, elongated, thickened toenails which they cannot trim themself. Denies any redness or drainage around the nails. Denies any acute changes since last appointment and no new complaints today. Denies any systemic complaints such as fevers, chills, nausea, vomiting.   Objective: NAD DP/PT pulses palpable, CRT less than 3 seconds Nails hypertrophic, dystrophic, elongated, brittle, discolored 10. There is tenderness overlying the nails 1-5 bilaterally. There is no surrounding erythema or drainage along the nail sites. No open lesions or pre-ulcerative lesions are identified. No other areas of tenderness bilateral lower extremities. No overlying edema, erythema, increased warmth. No pain with calf compression, swelling, warmth, erythema.  Assessment: Patient presents with symptomatic onychomycosis  Plan: -Treatment options including alternatives, risks, complications were discussed -Nails sharply debrided 10 without complication/bleeding. -Discussed daily foot inspection. If there are any changes, to call the office immediately.  -Follow-up in 3 months or sooner if any problems are to arise. In the meantime, encouraged to call the office with any questions, concerns, changes symptoms.  Celesta Gentile, DPM

## 2017-09-08 ENCOUNTER — Encounter (HOSPITAL_COMMUNITY): Payer: Self-pay | Admitting: Nurse Practitioner

## 2017-09-08 ENCOUNTER — Emergency Department (HOSPITAL_COMMUNITY)
Admission: EM | Admit: 2017-09-08 | Discharge: 2017-09-09 | Disposition: A | Discharge status: Home / Self Care | Payer: Medicare Other | Attending: Emergency Medicine | Admitting: Emergency Medicine

## 2017-09-08 DIAGNOSIS — Z7982 Long term (current) use of aspirin: Secondary | ICD-10-CM | POA: Insufficient documentation

## 2017-09-08 DIAGNOSIS — T839XXA Unspecified complication of genitourinary prosthetic device, implant and graft, initial encounter: Secondary | ICD-10-CM | POA: Insufficient documentation

## 2017-09-08 DIAGNOSIS — Z7902 Long term (current) use of antithrombotics/antiplatelets: Secondary | ICD-10-CM | POA: Insufficient documentation

## 2017-09-08 DIAGNOSIS — Z96649 Presence of unspecified artificial hip joint: Secondary | ICD-10-CM | POA: Insufficient documentation

## 2017-09-08 DIAGNOSIS — Y732 Prosthetic and other implants, materials and accessory gastroenterology and urology devices associated with adverse incidents: Secondary | ICD-10-CM | POA: Insufficient documentation

## 2017-09-08 DIAGNOSIS — F0390 Unspecified dementia without behavioral disturbance: Secondary | ICD-10-CM | POA: Insufficient documentation

## 2017-09-08 DIAGNOSIS — G20 Parkinson's disease: Secondary | ICD-10-CM | POA: Insufficient documentation

## 2017-09-08 DIAGNOSIS — D119 Benign neoplasm of major salivary gland, unspecified: Secondary | ICD-10-CM | POA: Insufficient documentation

## 2017-09-08 DIAGNOSIS — I5032 Chronic diastolic (congestive) heart failure: Secondary | ICD-10-CM | POA: Insufficient documentation

## 2017-09-08 DIAGNOSIS — N3001 Acute cystitis with hematuria: Secondary | ICD-10-CM | POA: Insufficient documentation

## 2017-09-08 DIAGNOSIS — I110 Hypertensive heart disease with heart failure: Secondary | ICD-10-CM | POA: Insufficient documentation

## 2017-09-08 NOTE — ED Provider Notes (Signed)
[facility] DEPT Provider Note   CSN: [id number] Arrival date & time: 09/08/17  2235     History   Chief Complaint No chief complaint on file.   HPI Dwayne Thelin. is a 82 y.o. male with history of chronic indwelling Foley catheter, dementia, diabetes, Parkinson's disease who presents for irrigation of Foley catheter. Patient has presented for this problem in the past due to hematuria. He denies any pain or fevers. Per review of medical records from patient's SNF, it does not look like patient is on any antibiotics currently. Patient denies any abdominal pain, nausea, vomiting, chest pain, shortness of breath.  HPI  Past Medical History:  Diagnosis Date  . Anxiety   . Arthritis   . Benign prostate hyperplasia   . Bipolar disorder ([place])   . C2 cervical fracture ([name])    due to pt fall  . Chronic indwelling Foley catheter   . Depression   . Diabetes mellitus type II   . Falls   . Flu   . Hearing aid worn   . Hypertension   . Memory difficulty 08/21/2014  . Memory loss   . MRSA infection (methicillin-resistant Staphylococcus aureus)   . Neuromuscular disorder ([place])    parkinsons  . Parkinson disease ([place])   . Pneumonia   . SIRS (systemic inflammatory response syndrome) ([name])   . UTI (urinary tract infection)     Patient Active Problem List   Diagnosis Date Noted  . Dementia due to Parkinson's disease with behavioral disturbance ([place]) 08/06/2017  . Pyelonephritis 08/06/2017  . Contusion of head 08/06/2017  . Pressure injury of skin 08/06/2017  . Complicated UTI (urinary tract infection) 08/05/2017  . CAP (community acquired pneumonia) 11/29/2016  . Bacteremia 03/14/2016  . HCAP (healthcare-associated pneumonia)   . Pressure ulcer 10/01/2015  . Right lower lobe pneumonia ([place]) 10/01/2015  . Sepsis due to Gram-negative organism with acute respiratory failure ([place]) 10/01/2015  . Acute renal failure ([place]) 10/01/2015  . Acute respiratory failure with hypoxia  ([place]) 09/30/2015  . Influenza A (H1N1) 03/25/2015  . Dementia without behavioral disturbance 03/25/2015  . Hyperkalemia 03/25/2015  . Falls 03/16/2015  . Sacral pressure ulcer 03/16/2015  . Fever 03/16/2015  . Memory difficulty 08/21/2014  . Malnutrition of moderate degree ([place]) 04/19/2014  . UTI (lower urinary tract infection) 04/18/2014  . Hypoxia 04/18/2014  . PNA (pneumonia) 04/18/2014  . Chronic diastolic congestive heart failure ([place]) 02/01/2013  . Pericardial effusion 02/01/2013  . Bradycardia 01/31/2013  . Empyema lung ([place]) 12/23/2012  . S/P thoracotomy 12/23/2012  . Abnormality of gait 12/05/2012  . Hypernatremia 08/13/2012  . Hypertension   . Diabetes mellitus, type 2 ([place])   . Arthritis   . Bipolar disorder ([place])   . Anxiety   . Chronic indwelling Foley catheter   . Neuromuscular disorder ([place])   . Bacteremia of undetermined etiology 06/17/2012  . Leukocytosis 04/17/2012  . Parkinson disease ([place]) 04/16/2012  . Elevated troponin 04/16/2012  . Hearing aid worn   . Diabetes mellitus ([place]) 07/06/2011    Past Surgical History:  Procedure Laterality Date  . TOTAL HIP ARTHROPLASTY    . VIDEO ASSISTED THORACOSCOPY (VATS)/EMPYEMA  12/16/2012   Procedure: VIDEO ASSISTED THORACOSCOPY (VATS)/EMPYEMA;  Surgeon: Melrose Nakayama, MD;  Location: [facility];  Service: Thoracic;  Laterality: Right;  Marland Kitchen VIDEO BRONCHOSCOPY  12/16/2012   Procedure: VIDEO BRONCHOSCOPY;  Surgeon: Melrose Nakayama, MD;  Location: [facility];  Service: Thoracic;  Laterality: Right;  Home Medications    Prior to Admission medications   Medication Sig Start Date End Date Taking? Authorizing Provider  acetaminophen (TYLENOL) 325 MG tablet Take 650 mg by mouth every 6 (six) hours as needed for mild pain.   Yes [provider]  aspirin 81 MG chewable tablet Chew 81 mg by mouth daily.   Yes [provider]  carbidopa-levodopa (SINEMET IR) 25-250 MG per tablet Take 1 tablet by  mouth 3 (three) times daily.   Yes [provider]  clonazePAM (KLONOPIN) 0.5 MG tablet Take 0.5 tablets (0.25 mg total) by mouth 2 (two) times daily as needed (anxiety). 08/08/17  Yes Mariel Aloe, MD  clopidogrel (PLAVIX) 75 MG tablet Take 1 tablet (75 mg total) by mouth daily. 10/07/15  Yes Florencia Reasons, MD  entacapone (COMTAN) 200 MG tablet Take 200 mg by mouth 3 (three) times daily.   Yes [provider]  feeding supplement, GLUCERNA SHAKE, (GLUCERNA SHAKE) LIQD Take 237 mLs by mouth 2 (two) times daily between meals. 03/16/16  Yes Kelvin Cellar, MD  ferrous sulfate 325 (65 FE) MG tablet Take 325 mg by mouth 2 (two) times daily.   Yes [provider]  isosorbide mononitrate (IMDUR) 30 MG 24 hr tablet Take 1 tablet (30 mg total) by mouth daily. 10/07/15  Yes Florencia Reasons, MD  lamoTRIgine (LAMICTAL) 150 MG tablet Take 1 tablet (150 mg total) by mouth 2 (two) times daily. 07/17/17  Yes Arfeen, Arlyce Harman, MD  Multiple Vitamin (MULTIVITAMIN WITH MINERALS) TABS tablet Take 1 tablet by mouth daily.   Yes [provider]  nystatin (MYCOSTATIN/NYSTOP) powder Apply to affected area of skin in genital area three times daily for at least 2 weeks, or until resolution of yeast infection. 08/21/17  Yes Street, Mercedes, PA-C  pantoprazole (PROTONIX) 40 MG tablet Take 40 mg by mouth daily. Reported on 07/06/2016   Yes [provider]  polyethylene glycol (MIRALAX / GLYCOLAX) packet Take 17 g by mouth daily.   Yes [provider]  predniSONE (DELTASONE) 2.5 MG tablet Take 2.5 mg by mouth daily with breakfast.   Yes [provider]  QUEtiapine (SEROQUEL) 25 MG tablet Take 3 tablets (75 mg total) by mouth at bedtime. 07/17/17  Yes Arfeen, Arlyce Harman, MD  rosuvastatin (CRESTOR) 20 MG tablet Take 20 mg by mouth daily.   Yes [provider]  sertraline (ZOLOFT) 100 MG tablet Take 2 tablets (200 mg total) by mouth daily. Patient taking differently: Take 200 mg by  mouth 2 (two) times daily.  07/17/17  Yes Arfeen, Arlyce Harman, MD  traZODone (DESYREL) 50 MG tablet Take 1 tablet (50 mg total) by mouth at bedtime. 01/17/17  Yes Arfeen, Arlyce Harman, MD  belladonna-opium (B&O SUPPRETTES) 16.2-30 MG suppository Place 1 suppository rectally every 8 (eight) hours as needed for pain. Patient not taking: Reported on 7/67/3419 3/79/02   Delora Fuel, MD  cephALEXin (KEFLEX) 500 MG capsule Take 1 capsule (500 mg total) by mouth 3 (three) times daily. 09/09/17   Channing Yeager, Bea Graff, PA-C  ciprofloxacin (CIPRO) 500 MG tablet Take 1 tablet (500 mg total) by mouth 2 (two) times daily. One po bid x 7 days Patient not taking: Reported on 09/08/2017 08/21/17   Street, [place], PA-C  doxycycline (VIBRAMYCIN) 100 MG capsule Take 1 capsule (100 mg total) by mouth 2 (two) times daily. Patient not taking: Reported on 09/08/2017 08/23/17   Lajean Saver, MD  lisinopril (PRINIVIL,ZESTRIL) 10 MG tablet Take 0.5 tablets (5  mg total) by mouth daily. Patient not taking: Reported on 08/22/2017 10/07/15   Florencia Reasons, MD  mupirocin ointment (BACTROBAN) 2 % Place 1 application into the nose 2 (two) times daily. Patient not taking: Reported on 08/23/2017 08/08/17   Mariel Aloe, MD    Family History Family History  Problem Relation Age of Onset  . Depression Father   . Cancer Mother   . Heart disease Mother     Social History Social History  Substance Use Topics  . Smoking status: Never Smoker  . Smokeless tobacco: Never Used  . Alcohol use No     Comment: occasional     Allergies   Plant sterols and stanols   Review of Systems Review of Systems  Constitutional: Negative for chills and fever.  HENT: Negative for facial swelling and sore throat.   Respiratory: Negative for shortness of breath.   Cardiovascular: Negative for chest pain.  Gastrointestinal: Negative for abdominal pain, nausea and vomiting.  Genitourinary: Positive for hematuria. Negative for dysuria.  Musculoskeletal: Negative  for back pain.  Skin: Negative for rash and wound.  Neurological: Negative for headaches.  Psychiatric/Behavioral: The patient is not nervous/anxious.      Physical Exam Updated Vital Signs BP (!) 161/70 (BP Location: Left Arm)   Pulse (!) 52   Temp 98.5 F (36.9 C) (Oral)   Resp 18   SpO2 97%   Physical Exam  Constitutional: He appears well-developed and well-nourished. No distress.  HENT:  Head: Normocephalic and atraumatic.  Mouth/Throat: Oropharynx is clear and moist. No oropharyngeal exudate.  Eyes: Pupils are equal, round, and reactive to light. Conjunctivae are normal. Right eye exhibits no discharge. Left eye exhibits no discharge. No scleral icterus.  Neck: Normal range of motion. Neck supple. No thyromegaly present.  Cardiovascular: Normal rate, regular rhythm, normal heart sounds and intact distal pulses.  Exam reveals no gallop and no friction rub.   No murmur heard. Pulmonary/Chest: Effort normal and breath sounds normal. No stridor. No respiratory distress. He has no wheezes. He has no rales.  Abdominal: Soft. Bowel sounds are normal. He exhibits no distension. There is no tenderness. There is no rebound and no guarding.  Genitourinary:  Genitourinary Comments: Gross blood in urine bag and around Foley at the meatus of penis  Musculoskeletal: He exhibits no edema.  Lymphadenopathy:    He has no cervical adenopathy.  Neurological: He is alert. Coordination normal.  Skin: Skin is warm and dry. No rash noted. He is not diaphoretic. No pallor.  Psychiatric: He has a normal mood and affect.  Nursing note and vitals reviewed.    ED Treatments / Results  Labs (all labs ordered are listed, but only abnormal results are displayed) Labs Reviewed  URINALYSIS, ROUTINE W REFLEX MICROSCOPIC - Abnormal; Notable for the following:       Result Value   APPearance HAZY (*)    Hgb urine dipstick LARGE (*)    Protein, ur 30 (*)    Leukocytes, UA LARGE (*)    Bacteria, UA  RARE (*)    All other components within normal limits  URINE CULTURE    EKG  EKG Interpretation None       Radiology No results found.  Procedures Procedures (including critical care time)  Medications Ordered in ED Medications  fluconazole (DIFLUCAN) tablet 150 mg (not administered)  fosfomycin (MONUROL) packet 3 g (not administered)     Initial Impression / Assessment and Plan / ED Course  I have  reviewed the triage vital signs and the nursing notes.  Pertinent labs & imaging results that were available during my care of the patient were reviewed by me and considered in my medical decision making (see chart for details).     Per Rica Mote, RN, Foley was irrigated and flowing well now. CBI completed and UA sent, which showed Hematuria, large leukocytes, budding yeast. We will treat with Keflex. Urine culture sent. Patient given dose of fosfomycin in the ED, as well as Diflucan. Patient safe for discharge back to SNF. Follow up to PCP and urology as needed. Patient vitals stable throughout ED course and discharged in satisfactory condition. Patient also evaluated by Dr. Nicholes Stairs who guided the patient's management and agrees with plan.  Final Clinical Impressions(s) / ED Diagnoses   Final diagnoses:  Problem with Foley catheter, initial encounter ([place])  Acute cystitis with hematuria    New Prescriptions New Prescriptions   CEPHALEXIN (KEFLEX) 500 MG CAPSULE    Take 1 capsule (500 mg total) by mouth 3 (three) times daily.     Dwayne Kuster, PA-C 09/09/17 [name], April, MD 09/09/17 [phone]

## 2017-09-08 NOTE — ED Notes (Signed)
Bed: KM63 Expected date:  Expected time:  Means of arrival:  Comments: 82 yo M/SNF Hematuria

## 2017-09-08 NOTE — ED Triage Notes (Signed)
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 Scant hematuria noted on arrival, no obvious/active signs of pain induced distress.

## 2017-09-09 LAB — URINALYSIS, ROUTINE W REFLEX MICROSCOPIC
Bilirubin Urine: NEGATIVE
Glucose, UA: NEGATIVE mg/dL
Ketones, ur: NEGATIVE mg/dL
Nitrite: NEGATIVE
Protein, ur: 30 mg/dL — AB
Specific Gravity, Urine: 1.012 (ref 1.005–1.030)
Squamous Epithelial / LPF: NONE SEEN
pH: 7 (ref 5.0–8.0)

## 2017-09-09 MED ORDER — CEPHALEXIN 500 MG PO CAPS: 500.0000 mg | ORAL_CAPSULE | Freq: Three times a day (TID) | ORAL | 0 refills | Status: DC

## 2017-09-09 MED ORDER — FLUCONAZOLE 150 MG PO TABS
150.0000 mg | ORAL_TABLET | Freq: Once | ORAL | Status: AC
  Administered 2017-09-09: 150 mg via ORAL
  Filled 2017-09-09: qty 1

## 2017-09-09 MED ORDER — FOSFOMYCIN TROMETHAMINE 3 G PO PACK
3.0000 g | PACK | Freq: Once | ORAL | Status: AC
  Administered 2017-09-09: 3 g via ORAL
  Filled 2017-09-09: qty 3

## 2017-09-09 NOTE — Discharge Instructions (Signed)
Begin taking Keflex tomorrow at breakfast. Make sure to finish all this medication. Please follow-up with your primary care provider and/or urologist for recheck. Please return to emergency department if you develop any new or worsening symptoms.

## 2017-09-11 LAB — URINE CULTURE
Culture: >=100000 — AB
Special Requests: NORMAL

## 2017-09-12 ENCOUNTER — Telehealth: Payer: Self-pay | Admitting: *Deleted

## 2017-09-12 NOTE — Telephone Encounter (Signed)
Post ED Visit - Positive Culture Follow-up  Culture report reviewed by antimicrobial stewardship pharmacist:  []  Elenor Quinones, Pharm.D. []  Heide Guile, Pharm.D., BCPS AQ-ID [x]  Parks Neptune, Pharm.D., BCPS []  Alycia Rossetti, Pharm.D., BCPS []  [place], [place].D., BCPS, AAHIVP []  Legrand Como, Pharm.D., BCPS, AAHIVP []  Salome Arnt, PharmD, BCPS []  Dimitri Ped, PharmD, BCPS []  Vincenza Hews, PharmD, BCPS  Positive urine culture Treated with Cephalexin, organism sensitive to the same and no further patient follow-up is required at this time.  Harlon Flor [facility] 09/12/2017, 10:41 AM

## 2017-09-22 ENCOUNTER — Encounter (HOSPITAL_COMMUNITY): Payer: Self-pay | Admitting: Emergency Medicine

## 2017-09-22 ENCOUNTER — Emergency Department (HOSPITAL_COMMUNITY)
Admission: EM | Admit: 2017-09-22 | Discharge: 2017-09-23 | Disposition: A | Discharge status: Skilled Nursing Facility | Payer: Medicare Other | Attending: Emergency Medicine | Admitting: Emergency Medicine

## 2017-09-22 DIAGNOSIS — I110 Hypertensive heart disease with heart failure: Secondary | ICD-10-CM | POA: Insufficient documentation

## 2017-09-22 DIAGNOSIS — Z7902 Long term (current) use of antithrombotics/antiplatelets: Secondary | ICD-10-CM | POA: Insufficient documentation

## 2017-09-22 DIAGNOSIS — G20 Parkinson's disease: Secondary | ICD-10-CM | POA: Insufficient documentation

## 2017-09-22 DIAGNOSIS — N390 Urinary tract infection, site not specified: Secondary | ICD-10-CM | POA: Insufficient documentation

## 2017-09-22 DIAGNOSIS — T83518A Infection and inflammatory reaction due to other urinary catheter, initial encounter: Secondary | ICD-10-CM | POA: Insufficient documentation

## 2017-09-22 DIAGNOSIS — Y828 Other medical devices associated with adverse incidents: Secondary | ICD-10-CM | POA: Insufficient documentation

## 2017-09-22 DIAGNOSIS — E119 Type 2 diabetes mellitus without complications: Secondary | ICD-10-CM | POA: Insufficient documentation

## 2017-09-22 DIAGNOSIS — F0281 Dementia in other diseases classified elsewhere with behavioral disturbance: Secondary | ICD-10-CM | POA: Insufficient documentation

## 2017-09-22 DIAGNOSIS — T83511A Infection and inflammatory reaction due to indwelling urethral catheter, initial encounter: Secondary | ICD-10-CM

## 2017-09-22 DIAGNOSIS — Z79899 Other long term (current) drug therapy: Secondary | ICD-10-CM | POA: Insufficient documentation

## 2017-09-22 DIAGNOSIS — Z7982 Long term (current) use of aspirin: Secondary | ICD-10-CM | POA: Insufficient documentation

## 2017-09-22 DIAGNOSIS — I5032 Chronic diastolic (congestive) heart failure: Secondary | ICD-10-CM | POA: Insufficient documentation

## 2017-09-22 NOTE — ED Notes (Signed)
Bed: WHALB Expected date:  Expected time:  Means of arrival:  Comments: 

## 2017-09-22 NOTE — ED Triage Notes (Signed)
Brought in by EMS from [facility] with c/o altered mental status and increased aggressive/combative behavior.  Pt has recently completed antibiotics for his UTI.  Per family, pt "is not acting right" and they are concerned if his UTI is resolved.

## 2017-09-22 NOTE — ED Notes (Signed)
Pt's contact:  Gloria(HCPOA)----- tel# [phone]

## 2017-09-22 NOTE — ED Provider Notes (Signed)
[facility] DEPT Provider Note   CSN: [id number] Arrival date & time: 09/22/17  2238     History   Chief Complaint Chief Complaint  Patient presents with  . Altered Mental Status    HPI [name]. is a 82 y.o. male.  Patient is an 82 year old male with past medical history of diabetes, Parkinson's disease, bipolar disorder, and chronic indwelling Foley catheter. He presents for evaluation of behavioral changes. He lives in an assisted living facility where he has been more combative and irritable. Apparently, he displays these behaviors when he has a urinary tract infection. He was recently treated for this and the family is concerned it may have returned. He denies to me he is experiencing any discomfort. He denies any abdominal pain, fevers, chest pain, headache, or other physical complaints.   The history is provided by the patient.  Altered Mental Status   This is a new problem. The current episode started yesterday. The problem has been gradually worsening. Associated symptoms include confusion and agitation.    Past Medical History:  Diagnosis Date  . Anxiety   . Arthritis   . Benign prostate hyperplasia   . Bipolar disorder ([place])   . C2 cervical fracture ([name])    due to pt fall  . Chronic indwelling Foley catheter   . Depression   . Diabetes mellitus type II   . Falls   . Flu   . Hearing aid worn   . Hypertension   . Memory difficulty 08/21/2014  . Memory loss   . MRSA infection (methicillin-resistant Staphylococcus aureus)   . Neuromuscular disorder ([place])    parkinsons  . Parkinson disease ([place])   . Pneumonia   . SIRS (systemic inflammatory response syndrome) ([name])   . UTI (urinary tract infection)     Patient Active Problem List   Diagnosis Date Noted  . Dementia due to Parkinson's disease with behavioral disturbance ([place]) 08/06/2017  . Pyelonephritis 08/06/2017  . Contusion of head 08/06/2017  . Pressure injury of skin 08/06/2017  .  Complicated UTI (urinary tract infection) 08/05/2017  . CAP (community acquired pneumonia) 11/29/2016  . Bacteremia 03/14/2016  . HCAP (healthcare-associated pneumonia)   . Pressure ulcer 10/01/2015  . Right lower lobe pneumonia ([place]) 10/01/2015  . Sepsis due to Gram-negative organism with acute respiratory failure ([place]) 10/01/2015  . Acute renal failure ([place]) 10/01/2015  . Acute respiratory failure with hypoxia ([place]) 09/30/2015  . Influenza A (H1N1) 03/25/2015  . Dementia without behavioral disturbance 03/25/2015  . Hyperkalemia 03/25/2015  . Falls 03/16/2015  . Sacral pressure ulcer 03/16/2015  . Fever 03/16/2015  . Memory difficulty 08/21/2014  . Malnutrition of moderate degree ([place]) 04/19/2014  . UTI (lower urinary tract infection) 04/18/2014  . Hypoxia 04/18/2014  . PNA (pneumonia) 04/18/2014  . Chronic diastolic congestive heart failure ([place]) 02/01/2013  . Pericardial effusion 02/01/2013  . Bradycardia 01/31/2013  . Empyema lung ([place]) 12/23/2012  . S/P thoracotomy 12/23/2012  . Abnormality of gait 12/05/2012  . Hypernatremia 08/13/2012  . Hypertension   . Diabetes mellitus, type 2 ([place])   . Arthritis   . Bipolar disorder ([place])   . Anxiety   . Chronic indwelling Foley catheter   . Neuromuscular disorder ([place])   . Bacteremia of undetermined etiology 06/17/2012  . Leukocytosis 04/17/2012  . Parkinson disease ([place]) 04/16/2012  . Elevated troponin 04/16/2012  . Hearing aid worn   . Diabetes mellitus ([place]) 07/06/2011    Past Surgical History:  Procedure Laterality Date  .  TOTAL HIP ARTHROPLASTY    . VIDEO ASSISTED THORACOSCOPY (VATS)/EMPYEMA  12/16/2012   Procedure: VIDEO ASSISTED THORACOSCOPY (VATS)/EMPYEMA;  Surgeon: Melrose Nakayama, MD;  Location: [facility];  Service: Thoracic;  Laterality: Right;  Marland Kitchen VIDEO BRONCHOSCOPY  12/16/2012   Procedure: VIDEO BRONCHOSCOPY;  Surgeon: Melrose Nakayama, MD;  Location: [facility];  Service: Thoracic;  Laterality: Right;        Home Medications    Prior to Admission medications   Medication Sig Start Date End Date Taking? Authorizing Provider  acetaminophen (TYLENOL) 325 MG tablet Take 650 mg by mouth every 6 (six) hours as needed for mild pain.    [provider]  aspirin 81 MG chewable tablet Chew 81 mg by mouth daily.    [provider]  belladonna-opium (B&O SUPPRETTES) 16.2-30 MG suppository Place 1 suppository rectally every 8 (eight) hours as needed for pain. Patient not taking: Reported on 3/61/4431 5/40/08   Delora Fuel, MD  carbidopa-levodopa (SINEMET IR) 25-250 MG per tablet Take 1 tablet by mouth 3 (three) times daily.    [provider]  cephALEXin (KEFLEX) 500 MG capsule Take 1 capsule (500 mg total) by mouth 3 (three) times daily. 09/09/17   Law, Bea Graff, PA-C  ciprofloxacin (CIPRO) 500 MG tablet Take 1 tablet (500 mg total) by mouth 2 (two) times daily. One po bid x 7 days Patient not taking: Reported on 09/08/2017 08/21/17   Street, [place], PA-C  clonazePAM (KLONOPIN) 0.5 MG tablet Take 0.5 tablets (0.25 mg total) by mouth 2 (two) times daily as needed (anxiety). 08/08/17   Mariel Aloe, MD  clopidogrel (PLAVIX) 75 MG tablet Take 1 tablet (75 mg total) by mouth daily. 10/07/15   Florencia Reasons, MD  doxycycline (VIBRAMYCIN) 100 MG capsule Take 1 capsule (100 mg total) by mouth 2 (two) times daily. Patient not taking: Reported on 09/08/2017 08/23/17   Lajean Saver, MD  entacapone (COMTAN) 200 MG tablet Take 200 mg by mouth 3 (three) times daily.    [provider]  feeding supplement, GLUCERNA SHAKE, (GLUCERNA SHAKE) LIQD Take 237 mLs by mouth 2 (two) times daily between meals. 03/16/16   Kelvin Cellar, MD  ferrous sulfate 325 (65 FE) MG tablet Take 325 mg by mouth 2 (two) times daily.    [provider]  isosorbide mononitrate (IMDUR) 30 MG 24 hr tablet Take 1 tablet (30 mg total) by mouth daily. 10/07/15   Florencia Reasons, MD  lamoTRIgine (LAMICTAL) 150  MG tablet Take 1 tablet (150 mg total) by mouth 2 (two) times daily. 07/17/17   Arfeen, Arlyce Harman, MD  lisinopril (PRINIVIL,ZESTRIL) 10 MG tablet Take 0.5 tablets (5 mg total) by mouth daily. Patient not taking: Reported on 08/22/2017 10/07/15   Florencia Reasons, MD  Multiple Vitamin (MULTIVITAMIN WITH MINERALS) TABS tablet Take 1 tablet by mouth daily.    [provider]  mupirocin ointment (BACTROBAN) 2 % Place 1 application into the nose 2 (two) times daily. Patient not taking: Reported on 08/23/2017 08/08/17   Mariel Aloe, MD  nystatin (MYCOSTATIN/NYSTOP) powder Apply to affected area of skin in genital area three times daily for at least 2 weeks, or until resolution of yeast infection. 08/21/17   Street, Mercedes, PA-C  pantoprazole (PROTONIX) 40 MG tablet Take 40 mg by mouth daily. Reported on 07/06/2016    [provider]  polyethylene glycol (MIRALAX / GLYCOLAX) packet Take 17 g by mouth daily.    [provider]  predniSONE (DELTASONE) 2.5  MG tablet Take 2.5 mg by mouth daily with breakfast.    [provider]  QUEtiapine (SEROQUEL) 25 MG tablet Take 3 tablets (75 mg total) by mouth at bedtime. 07/17/17   Arfeen, Arlyce Harman, MD  rosuvastatin (CRESTOR) 20 MG tablet Take 20 mg by mouth daily.    [provider]  sertraline (ZOLOFT) 100 MG tablet Take 2 tablets (200 mg total) by mouth daily. Patient taking differently: Take 200 mg by mouth 2 (two) times daily.  07/17/17   Arfeen, Arlyce Harman, MD  traZODone (DESYREL) 50 MG tablet Take 1 tablet (50 mg total) by mouth at bedtime. 01/17/17   Arfeen, Arlyce Harman, MD    Family History Family History  Problem Relation Age of Onset  . Depression Father   . Cancer Mother   . Heart disease Mother     Social History Social History  Substance Use Topics  . Smoking status: Never Smoker  . Smokeless tobacco: Never Used  . Alcohol use No     Comment: occasional     Allergies   Plant sterols and stanols   Review of  Systems Review of Systems  Psychiatric/Behavioral: Positive for agitation and confusion.  All other systems reviewed and are negative.    Physical Exam Updated Vital Signs BP (!) 148/66 (BP Location: Left Arm)   Pulse (!) 50   Temp (!) 97.4 F (36.3 C) (Oral)   Resp 18   SpO2 100%   Physical Exam  Constitutional: He is oriented to person, place, and time. He appears well-developed and well-nourished. No distress.  Patient is an elderly male in no acute distress. He is awake, alert.  HENT:  Head: Normocephalic and atraumatic.  Mouth/Throat: Oropharynx is clear and moist.  Neck: Normal range of motion. Neck supple.  Cardiovascular: Normal rate and regular rhythm.  Exam reveals no friction rub.   No murmur heard. Pulmonary/Chest: Effort normal and breath sounds normal. No respiratory distress. He has no wheezes. He has no rales.  Abdominal: Soft. Bowel sounds are normal. He exhibits no distension. There is no tenderness.  Musculoskeletal: Normal range of motion. He exhibits no edema.  Neurological: He is alert and oriented to person, place, and time. Coordination normal.  Skin: Skin is warm and dry. He is not diaphoretic.  Nursing note and vitals reviewed.    ED Treatments / Results  Labs (all labs ordered are listed, but only abnormal results are displayed) Labs Reviewed  BASIC METABOLIC PANEL  CBC WITH DIFFERENTIAL/PLATELET  URINALYSIS, ROUTINE W REFLEX MICROSCOPIC    EKG  EKG Interpretation None       Radiology No results found.  Procedures Procedures (including critical care time)  Medications Ordered in ED Medications - No data to display   Initial Impression / Assessment and Plan / ED Course  I have reviewed the triage vital signs and the nursing notes.  Pertinent labs & imaging results that were available during my care of the patient were reviewed by me and considered in my medical decision making (see chart for details).  Patient sent from his  extended care facility for evaluation of agitation. They state that he gets this way when he develops UTIs. He has an indwelling Foley catheter and today's urine appears infected. He was given IV Levaquin and will be discharged with oral Levaquin. He is to follow-up as needed for any problems. His laboratory studies are otherwise reassuring and he appears in no acute distress.  Final Clinical Impressions(s) / ED Diagnoses  Final diagnoses:  None    New Prescriptions New Prescriptions   No medications on file     Veryl Speak, MD 09/23/17 [phone]

## 2017-09-23 LAB — URINALYSIS, ROUTINE W REFLEX MICROSCOPIC
Bilirubin Urine: NEGATIVE
Glucose, UA: NEGATIVE mg/dL
Hgb urine dipstick: NEGATIVE
KETONES UR: 5 mg/dL — AB
Nitrite: NEGATIVE
PH: 5 (ref 5.0–8.0)
PROTEIN: 30 mg/dL — AB
Specific Gravity, Urine: 1.02 (ref 1.005–1.030)
Squamous Epithelial / LPF: NONE SEEN

## 2017-09-23 LAB — CBC WITH DIFFERENTIAL/PLATELET
BASOS ABS: 0 10*3/uL (ref 0.0–0.1)
BASOS PCT: 0 %
Eosinophils Absolute: 0.6 10*3/uL (ref 0.0–0.7)
Eosinophils Relative: 8 %
HEMATOCRIT: 34 % — AB (ref 39.0–52.0)
HEMOGLOBIN: 10.6 g/dL — AB (ref 13.0–17.0)
Lymphocytes Relative: 22 %
Lymphs Abs: 1.7 10*3/uL (ref 0.7–4.0)
MCH: 28.6 pg (ref 26.0–34.0)
MCHC: 31.2 g/dL (ref 30.0–36.0)
MCV: 91.6 fL (ref 78.0–100.0)
Monocytes Absolute: 0.6 10*3/uL (ref 0.1–1.0)
Monocytes Relative: 8 %
NEUTROS ABS: 4.7 10*3/uL (ref 1.7–7.7)
NEUTROS PCT: 62 %
Platelets: 213 10*3/uL (ref 150–400)
RBC: 3.71 MIL/uL — ABNORMAL LOW (ref 4.22–5.81)
RDW: 14.7 % (ref 11.5–15.5)
WBC: 7.7 10*3/uL (ref 4.0–10.5)

## 2017-09-23 LAB — BASIC METABOLIC PANEL
ANION GAP: 9 (ref 5–15)
BUN: 25 mg/dL — AB (ref 6–20)
CALCIUM: 8.9 mg/dL (ref 8.9–10.3)
CHLORIDE: 108 mmol/L (ref 101–111)
CO2: 22 mmol/L (ref 22–32)
CREATININE: 1.07 mg/dL (ref 0.61–1.24)
GFR calc Af Amer: >60 mL/min (ref >60)
GFR calc non Af Amer: >60 mL/min (ref >60)
Glucose, Bld: 103 mg/dL — ABNORMAL HIGH (ref 65–99)
POTASSIUM: 4.4 mmol/L (ref 3.5–5.1)
SODIUM: 139 mmol/L (ref 135–145)

## 2017-09-23 MED ORDER — LEVOFLOXACIN IN D5W 750 MG/150ML IV SOLN
750.0000 mg | Freq: Once | INTRAVENOUS | Status: AC
  Administered 2017-09-23: 750 mg via INTRAVENOUS
  Filled 2017-09-23: qty 150

## 2017-09-23 MED ORDER — LEVOFLOXACIN 750 MG PO TABS: 750.0000 mg | ORAL_TABLET | Freq: Every day | ORAL | 0 refills | Status: DC

## 2017-09-23 NOTE — Discharge Instructions (Signed)
Levaquin as prescribed.  Follow-up with primary Dr. if not improving in the next 2-3 days.

## 2017-09-23 NOTE — ED Notes (Signed)
Pt's HCPOA, [name], was notified of pt's condition and discharge.

## 2017-09-23 NOTE — ED Notes (Signed)
[facility] here to transport pt back to [facility].

## 2017-09-23 NOTE — ED Notes (Signed)
[facility] staff was notified of pt's condition and discharge---- prescription was also provided.

## 2017-09-24 ENCOUNTER — Encounter (HOSPITAL_COMMUNITY): Payer: Self-pay | Admitting: Emergency Medicine

## 2017-09-24 ENCOUNTER — Emergency Department (HOSPITAL_COMMUNITY)
Admission: EM | Admit: 2017-09-24 | Discharge: 2017-09-24 | Disposition: A | Discharge status: Home / Self Care | Payer: Medicare Other | Attending: Emergency Medicine | Admitting: Emergency Medicine

## 2017-09-24 DIAGNOSIS — T83511A Infection and inflammatory reaction due to indwelling urethral catheter, initial encounter: Secondary | ICD-10-CM | POA: Insufficient documentation

## 2017-09-24 DIAGNOSIS — G20 Parkinson's disease: Secondary | ICD-10-CM | POA: Insufficient documentation

## 2017-09-24 DIAGNOSIS — I10 Essential (primary) hypertension: Secondary | ICD-10-CM | POA: Insufficient documentation

## 2017-09-24 DIAGNOSIS — F0281 Dementia in other diseases classified elsewhere with behavioral disturbance: Secondary | ICD-10-CM | POA: Insufficient documentation

## 2017-09-24 DIAGNOSIS — T83511D Infection and inflammatory reaction due to indwelling urethral catheter, subsequent encounter: Secondary | ICD-10-CM

## 2017-09-24 DIAGNOSIS — Z79899 Other long term (current) drug therapy: Secondary | ICD-10-CM | POA: Insufficient documentation

## 2017-09-24 DIAGNOSIS — Y829 Unspecified medical devices associated with adverse incidents: Secondary | ICD-10-CM | POA: Insufficient documentation

## 2017-09-24 DIAGNOSIS — N390 Urinary tract infection, site not specified: Secondary | ICD-10-CM

## 2017-09-24 MED ORDER — CEPHALEXIN 500 MG PO CAPS: 500.0000 mg | ORAL_CAPSULE | Freq: Two times a day (BID) | ORAL | 0 refills | Status: AC

## 2017-09-24 NOTE — Discharge Instructions (Signed)
Stay very well hydrated with plenty of water throughout the day. Take new antibiotic given today, and stop taking the levaquin you were given at your last visit; take antibiotic until completed. Follow up with your primary care physician in 3-5 days for recheck of ongoing symptoms but return to ER for emergent changing or worsening of symptoms. Please seek immediate care if you develop the following: You develop back pain.  Your symptoms are no better, or worse in 3 days. There is severe back pain or lower abdominal pain.  You develop chills.  You have a fever.  There is nausea or vomiting.  There is continued burning or discomfort with urination.

## 2017-09-24 NOTE — ED Notes (Signed)
[facility] called for transport.  

## 2017-09-24 NOTE — ED Notes (Signed)
Bed: EL07 Expected date:  Expected time:  Means of arrival:  Comments: 82 yo f, UTI

## 2017-09-24 NOTE — ED Triage Notes (Signed)
Per PTAR, patient from [facility], where staff reports according to the [facility] patient has a UTI but will not take PO abx. Staff reports patient was sent out for IV abx. A&Ox3. Hx Parkinson's and bipolar.  BP 140/80 HR 65 O2 98% CBG 128

## 2017-09-24 NOTE — ED Notes (Signed)
ED Provider at bedside. 

## 2017-09-24 NOTE — ED Provider Notes (Signed)
[facility] DEPT Provider Note   CSN: [id number] Arrival date & time: 09/24/17  1413     History   Chief Complaint No chief complaint on file.   HPI [name]. is a 82 y.o. male with a PMHx of memory difficulties, Parkinson's disease, BPH with chronic indwelling foley catheter, DM2, HTN, and other conditions listed below, who presents to the ED from [facility] for evaluation of his UTI which they report is requiring IV abx. Level 5 caveat due to memory difficulties, most of the information was provided by nursing home staff members.  Per Devonya at the nursing home, patient has a UTI and the urine culture that was done at their facility showed that he grew out P.Aeroginosa which was resistant to ciprofloxacin and levaquin. Chart review reveals that he was seen in the ED on 09/22/17 due to "behavior changes" (reportedly he was argumentative and agitated with staff, "being mean" per report today), he had a CBC/BMP done which were reassuring but the U/A revealed hazy urine with TNTC WBCs consistent with UTI, he was given IV levaquin before being discharged with PO levaquin 750mg  for 7 day course. It appears no UCx was sent on that urine, however his prior UTI was 09/08/17 and a UCx was sent, showed >=100,000 CFU of PSEUDOMONAS AERUGINOSA and 50,000 CFU of ENTEROCOCCUS FAECALIS, resistance to ciprofloxacin/levofloxacin but sensitive to the remainder of the antibiotics reported including cefepime, ceftazidime, gentamicin, imipenem, pip/tazo, ampicillin, nitrofurantoin, and Vancomycin. The nurse today reports that the bacteria was sensitive to cefepime, ceftazidime, gentamicin, imipenem, pip/tazo, and tobramycin which is why they sent him here for IV abx given that all of those on the list are IV meds. Nursing staff denies any other complaints or concerns related to the patient at this time, including denying any cough or URI symptoms, recent fevers, vomiting, diarrhea,  constipation, or noticing any hematuria. The patient denies any chest pain or shortness of breath, abdominal pain, dysuria, or any other complaints at this time. He is wondering when he can go home. No known aggravating or alleviating factors for his issue today.    The history is provided by the patient, medical records and the nursing home. No language interpreter was used.    Past Medical History:  Diagnosis Date  . Anxiety   . Arthritis   . Benign prostate hyperplasia   . Bipolar disorder ([place])   . C2 cervical fracture ([name])    due to pt fall  . Chronic indwelling Foley catheter   . Depression   . Diabetes mellitus type II   . Falls   . Flu   . Hearing aid worn   . Hypertension   . Memory difficulty 08/21/2014  . Memory loss   . MRSA infection (methicillin-resistant Staphylococcus aureus)   . Neuromuscular disorder ([place])    parkinsons  . Parkinson disease ([place])   . Pneumonia   . SIRS (systemic inflammatory response syndrome) ([name])   . UTI (urinary tract infection)     Patient Active Problem List   Diagnosis Date Noted  . 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 Pyelonephritis 08/06/2017  . Contusion of head 08/06/2017  . Pressure injury of skin 08/06/2017  . Complicated UTI (urinary tract infection) 08/05/2017  . CAP (community acquired pneumonia) 11/29/2016  . Bacteremia 03/14/2016  . HCAP (healthcare-associated pneumonia)   . Pressure ulcer 10/01/2015  . Right lower lobe pneumonia ([place]) 10/01/2015  . Sepsis due to Gram-negative  organism with acute respiratory failure ([place]) 10/01/2015  . Acute renal failure ([place]) 10/01/2015  . Acute respiratory failure with hypoxia ([place]) 09/30/2015  . Influenza A (H1N1) 03/25/2015  . Dementia without behavioral disturbance 03/25/2015  . Hyperkalemia 03/25/2015  . Falls 03/16/2015  . Sacral pressure ulcer 03/16/2015  . Fever 03/16/2015  . Memory difficulty 08/21/2014  . Malnutrition of  moderate degree ([place]) 04/19/2014  . UTI (lower urinary tract infection) 04/18/2014  . Hypoxia 04/18/2014  . PNA (pneumonia) 04/18/2014  . Chronic diastolic congestive heart failure ([place]) 02/01/2013  . Pericardial effusion 02/01/2013  . Bradycardia 01/31/2013  . Empyema lung ([place]) 12/23/2012  . S/P thoracotomy 12/23/2012  . Abnormality of gait 12/05/2012  . Hypernatremia 08/13/2012  . Hypertension   . Diabetes mellitus, type 2 ([place])   . Arthritis   . Bipolar disorder ([place])   . Anxiety   . Chronic indwelling Foley catheter   . Neuromuscular disorder ([place])   . Bacteremia of undetermined etiology 06/17/2012  . Leukocytosis 04/17/2012  . Parkinson disease ([place]) 04/16/2012  . Elevated troponin 04/16/2012  . Hearing aid worn   . Diabetes mellitus ([place]) 07/06/2011    Past Surgical History:  Procedure Laterality Date  . TOTAL HIP ARTHROPLASTY    . VIDEO ASSISTED THORACOSCOPY (VATS)/EMPYEMA  12/16/2012   Procedure: VIDEO ASSISTED THORACOSCOPY (VATS)/EMPYEMA;  Surgeon: Melrose Nakayama, MD;  Location: [facility];  Service: Thoracic;  Laterality: Right;  Marland Kitchen VIDEO BRONCHOSCOPY  12/16/2012   Procedure: VIDEO BRONCHOSCOPY;  Surgeon: Melrose Nakayama, MD;  Location: [facility];  Service: Thoracic;  Laterality: Right;       Home Medications    Prior to Admission medications   Medication Sig Start Date End Date Taking? Authorizing Provider  acetaminophen (TYLENOL) 325 MG tablet Take 650 mg by mouth every 8 (eight) hours as needed for mild pain.    Yes [provider]  aspirin 81 MG chewable tablet Chew 81 mg by mouth daily.   Yes [provider]  carbidopa-levodopa (SINEMET IR) 25-250 MG per tablet Take 1 tablet by mouth 3 (three) times daily.   Yes [provider]  clonazePAM (KLONOPIN) 0.5 MG tablet Take 0.5 tablets (0.25 mg total) by mouth 2 (two) times daily as needed (anxiety). 08/08/17  Yes Mariel Aloe, MD  clopidogrel (PLAVIX) 75 MG tablet Take 1 tablet  (75 mg total) by mouth daily. 10/07/15  Yes Florencia Reasons, MD  entacapone (COMTAN) 200 MG tablet Take 200 mg by mouth 3 (three) times daily.   Yes [provider]  feeding supplement, GLUCERNA SHAKE, (GLUCERNA SHAKE) LIQD Take 237 mLs by mouth 2 (two) times daily between meals. 03/16/16  Yes Kelvin Cellar, MD  ferrous sulfate 325 (65 FE) MG tablet Take 325 mg by mouth 2 (two) times daily.   Yes [provider]  isosorbide mononitrate (IMDUR) 30 MG 24 hr tablet Take 1 tablet (30 mg total) by mouth daily. 10/07/15  Yes Florencia Reasons, MD  lamoTRIgine (LAMICTAL) 150 MG tablet Take 1 tablet (150 mg total) by mouth 2 (two) times daily. 07/17/17  Yes Arfeen, Arlyce Harman, MD  levofloxacin (LEVAQUIN) 750 MG tablet Take 1 tablet (750 mg total) by mouth daily. X 7 days 09/23/17  Yes Delo, Nathaneil Canary, MD  Multiple Vitamin (MULTIVITAMIN WITH MINERALS) TABS tablet Take 1 tablet by mouth daily.   Yes [provider]  neomycin-bacitracin-polymyxin (NEOSPORIN) [phone] ointment Apply 1 application topically at bedtime.   Yes [provider]  nystatin (MYCOSTATIN/NYSTOP) powder Apply  to affected area of skin in genital area three times daily for at least 2 weeks, or until resolution of yeast infection. 08/21/17  Yes Swannie Milius, PA-C  pantoprazole (PROTONIX) 40 MG tablet Take 40 mg by mouth daily. Reported on 07/06/2016   Yes [provider]  polyethylene glycol (MIRALAX / GLYCOLAX) packet Take 17 g by mouth daily.   Yes [provider]  predniSONE (DELTASONE) 2.5 MG tablet Take 2.5 mg by mouth daily with breakfast.   Yes [provider]  QUEtiapine (SEROQUEL) 25 MG tablet Take 3 tablets (75 mg total) by mouth at bedtime. 07/17/17  Yes Arfeen, Arlyce Harman, MD  rosuvastatin (CRESTOR) 20 MG tablet Take 20 mg by mouth daily.   Yes [provider]  sertraline (ZOLOFT) 100 MG tablet Take 2 tablets (200 mg total) by mouth daily. Patient taking differently: Take 200 mg by  mouth 2 (two) times daily.  07/17/17  Yes Arfeen, Arlyce Harman, MD  traZODone (DESYREL) 50 MG tablet Take 1 tablet (50 mg total) by mouth at bedtime. 01/17/17  Yes Arfeen, Arlyce Harman, MD  belladonna-opium (B&O SUPPRETTES) 16.2-30 MG suppository Place 1 suppository rectally every 8 (eight) hours as needed for pain. Patient not taking: Reported on 6/43/3295 1/88/41   Delora Fuel, MD  cephALEXin (KEFLEX) 500 MG capsule Take 1 capsule (500 mg total) by mouth 3 (three) times daily. Patient not taking: Reported on 09/24/2017 09/09/17   Frederica Kuster, PA-C  ciprofloxacin (CIPRO) 500 MG tablet Take 1 tablet (500 mg total) by mouth 2 (two) times daily. One po bid x 7 days Patient not taking: Reported on 09/08/2017 08/21/17   Zaccai Chavarin, [place], PA-C  doxycycline (VIBRAMYCIN) 100 MG capsule Take 1 capsule (100 mg total) by mouth 2 (two) times daily. Patient not taking: Reported on 09/08/2017 08/23/17   Lajean Saver, MD  lisinopril (PRINIVIL,ZESTRIL) 10 MG tablet Take 0.5 tablets (5 mg total) by mouth daily. Patient not taking: Reported on 08/22/2017 10/07/15   Florencia Reasons, MD  mupirocin ointment (BACTROBAN) 2 % Place 1 application into the nose 2 (two) times daily. Patient not taking: Reported on 08/23/2017 08/08/17   Mariel Aloe, MD    Family History Family History  Problem Relation Age of Onset  . Depression Father   . Cancer Mother   . Heart disease Mother     Social History Social History  Substance Use Topics  . Smoking status: Never Smoker  . Smokeless tobacco: Never Used  . Alcohol use No     Comment: occasional     Allergies   Plant sterols and stanols   Review of Systems Review of Systems  Constitutional: Negative for chills and fever.  HENT: Negative for rhinorrhea.   Respiratory: Negative for cough and shortness of breath.   Cardiovascular: Negative for chest pain.  Gastrointestinal: Negative for abdominal pain, constipation, diarrhea, nausea and vomiting.  Genitourinary: Negative for  dysuria and hematuria.  Allergic/Immunologic: Positive for immunocompromised state (DM2).  Psychiatric/Behavioral: Positive for behavioral problems (being mean).   Level 5 caveat due to memory difficulties  Physical Exam Updated Vital Signs BP 127/63   Pulse (!) 56   Temp 97.8 F (36.6 C)   Resp 16   SpO2 99%   Physical Exam  Constitutional: Vital signs are normal. He appears well-developed and well-nourished.  Non-toxic appearance. No distress.  Afebrile, nontoxic, NAD  HENT:  Head: Normocephalic and atraumatic.  Mouth/Throat: Oropharynx is clear and moist and mucous membranes are normal.  Eyes: Conjunctivae  and EOM are normal. Right eye exhibits no discharge. Left eye exhibits no discharge.  Neck: Normal range of motion. Neck supple.  Cardiovascular: Normal rate, regular rhythm, normal heart sounds and intact distal pulses.  Exam reveals no gallop and no friction rub.   No murmur heard. Pulmonary/Chest: Effort normal and breath sounds normal. No respiratory distress. He has no decreased breath sounds. He has no wheezes. He has no rhonchi. He has no rales.  Abdominal: Soft. Normal appearance and bowel sounds are normal. He exhibits no distension. There is no tenderness. There is no rigidity, no rebound, no guarding, no CVA tenderness, no tenderness at McBurney's point and negative Murphy's sign.  Soft, NTND, +BS throughout, no r/g/r, neg murphy's, neg mcburney's, no CVA TTP   Musculoskeletal: Normal range of motion.  Neurological: He is alert. He has normal strength. No sensory deficit.  Skin: Skin is warm, dry and intact. No rash noted.  Psychiatric: He has a normal mood and affect.  Nursing note and vitals reviewed.    ED Treatments / Results  Labs (all labs ordered are listed, but only abnormal results are displayed) Labs Reviewed - No data to display  Results for orders placed or performed during the hospital encounter of 62/70/35  Basic metabolic panel  Result Value  Ref Range   Sodium 139 135 - 145 mmol/L   Potassium 4.4 3.5 - 5.1 mmol/L   Chloride 108 101 - 111 mmol/L   CO2 22 22 - 32 mmol/L   Glucose, Bld 103 (H) 65 - 99 mg/dL   BUN 25 (H) 6 - 20 mg/dL   Creatinine, Ser 1.07 0.61 - 1.24 mg/dL   Calcium 8.9 8.9 - 10.3 mg/dL   GFR calc non Af Amer >60 >60 mL/min   GFR calc Af Amer >60 >60 mL/min   Anion gap 9 5 - 15  Urinalysis, Routine w reflex microscopic  Result Value Ref Range   Color, Urine AMBER (A) YELLOW   APPearance HAZY (A) CLEAR   Specific Gravity, Urine 1.020 1.005 - 1.030   pH 5.0 5.0 - 8.0   Glucose, UA NEGATIVE NEGATIVE mg/dL   Hgb urine dipstick NEGATIVE NEGATIVE   Bilirubin Urine NEGATIVE NEGATIVE   Ketones, ur 5 (A) NEGATIVE mg/dL   Protein, ur 30 (A) NEGATIVE mg/dL   Nitrite NEGATIVE NEGATIVE   Leukocytes, UA LARGE (A) NEGATIVE   RBC / HPF 0-5 0 - 5 RBC/hpf   WBC, UA TOO NUMEROUS TO COUNT 0 - 5 WBC/hpf   Bacteria, UA RARE (A) NONE SEEN   Squamous Epithelial / LPF NONE SEEN NONE SEEN   Mucus PRESENT    Ca Oxalate Crys, UA PRESENT   CBC with Differential/Platelet  Result Value Ref Range   WBC 7.7 4.0 - 10.5 K/uL   RBC 3.71 (L) 4.22 - 5.81 MIL/uL   Hemoglobin 10.6 (L) 13.0 - 17.0 g/dL   HCT 34.0 (L) 39.0 - 52.0 %   MCV 91.6 78.0 - 100.0 fL   MCH 28.6 26.0 - 34.0 pg   MCHC 31.2 30.0 - 36.0 g/dL   RDW 14.7 11.5 - 15.5 %   Platelets 213 150 - 400 K/uL   Neutrophils Relative % 62 %   Neutro Abs 4.7 1.7 - 7.7 K/uL   Lymphocytes Relative 22 %   Lymphs Abs 1.7 0.7 - 4.0 K/uL   Monocytes Relative 8 %   Monocytes Absolute 0.6 0.1 - 1.0 K/uL   Eosinophils Relative 8 %   Eosinophils Absolute  0.6 0.0 - 0.7 K/uL   Basophils Relative 0 %   Basophils Absolute 0.0 0.0 - 0.1 K/uL    Urine culture 09/08/17: Component 2wk ago  Specimen Description URINE, CLEAN CATCH   Special Requests Normal   Culture   >=100,000 COLONIES/mL PSEUDOMONAS AERUGINOSA  50,000 COLONIES/mL ENTEROCOCCUS FAECALIS      Report Status 09/11/2017  FINAL   Organism ID, Bacteria PSEUDOMONAS AERUGINOSA    Organism ID, Bacteria ENTEROCOCCUS FAECALIS    Resulting Agency [facility]  Susceptibility    Pseudomonas aeruginosa Enterococcus faecalis    MIC MIC    AMPICILLIN   <=2 SENSITIVE "><=2 SENSITIVE  Sensitive    CEFEPIME <=1 SENSITIVE "><=1 SENSITIVE  Sensitive      CEFTAZIDIME 2 SENSITIVE  Sensitive      CIPROFLOXACIN >=4 RESISTANT  Resistant      GENTAMICIN <=1 SENSITIVE "><=1 SENSITIVE  Sensitive      IMIPENEM 2 SENSITIVE  Sensitive      LEVOFLOXACIN   >=8 RESISTANT  Resistant    NITROFURANTOIN   <=16 SENSITIVE "><=16 SENSIT... Sensitive    PIP/TAZO <=4 SENSITIVE "><=4 SENSITIVE  Sensitive      VANCOMYCIN   1 SENSITIVE  Sensitive           Susceptibility Comments   Pseudomonas aeruginosa  >=100,000 COLONIES/mL PSEUDOMONAS AERUGINOSA  Enterococcus faecalis  50,000 COLONIES/mL ENTEROCOCCUS FAECALIS    Specimen Collected: 09/08/17 23:04 Last Resulted: 09/11/17 07:40           EKG  EKG Interpretation None       Radiology No results found.  Procedures Procedures (including critical care time)  Medications Ordered in ED Medications - No data to display   Initial Impression / Assessment and Plan / ED Course  I have reviewed the triage vital signs and the nursing notes.  Pertinent labs & imaging results that were available during my care of the patient were reviewed by me and considered in my medical decision making (see chart for details).     82 y.o. male here due to UCx at nursing home showing resistance to PO abx so sent here for IV abx; I spoke with nursing home staff who reported that they did a UCx which revealed P.Aeruginosa that had resistance to levaquin and ciprofloxacin. Appears he was seen in ED for agitation on 09/22/17 and diagnosed with UTI and given levaquin, no UCx was sent at that visit. However prior UCx on 09/08/17 showed resistance to same abx. So he was sent here for IV abx, but the  sensitivities show that he could be treated with cephalosporins. Pt denies any complaints, exam is benign; nursing home has no concerns aside from his behavior issues for which he was already evaluated at his last ED visit. Therefore, no further work up needed, had labs the other day which were reassuring, doubt need for repeat; will stop levaquin and start on keflex 500mg  BID x10 days. Advised f/up with his PCP in 3-5 days. Discussed case with my attending Dr. Johnney Killian who agrees with plan.  I explained the diagnosis and have given explicit precautions to return to the ER including for any other new or worsening symptoms. The patient's care giver at nursing home understands and accepts the medical plan as it's been dictated and I have answered their questions. Discharge instructions concerning home care and prescriptions have been given. The patient is STABLE and is discharged to nursing home in good condition.    Final Clinical Impressions(s) / ED Diagnoses  Final diagnoses:  Urinary tract infection associated with indwelling urethral catheter, subsequent encounter    New Prescriptions New Prescriptions   CEPHALEXIN (KEFLEX) 500 MG CAPSULE    Take 1 capsule (500 mg total) by mouth 2 (two) times daily.     [address], [place], [place] 09/24/17 1647    Charlesetta Shanks, MD 09/25/17 [phone]

## 2017-09-25 NOTE — ED Notes (Signed)
Nurse at facility had questions regarding discharge medication

## 2017-10-04 ENCOUNTER — Ambulatory Visit: Payer: Medicare Other | Admitting: Neurology

## 2017-10-04 ENCOUNTER — Telehealth: Payer: Self-pay | Admitting: Neurology

## 2017-10-04 NOTE — Telephone Encounter (Signed)
This patient did not show for a revisit appointment today. 

## 2017-10-05 ENCOUNTER — Encounter: Payer: Self-pay | Admitting: Neurology

## 2017-10-17 ENCOUNTER — Ambulatory Visit (INDEPENDENT_AMBULATORY_CARE_PROVIDER_SITE_OTHER): Payer: BLUE CROSS/BLUE SHIELD | Admitting: Neurology

## 2017-10-17 ENCOUNTER — Encounter: Payer: Self-pay | Admitting: Neurology

## 2017-10-17 VITALS — BP 115/60 | HR 81 | Wt 169.5 lb

## 2017-10-17 DIAGNOSIS — G20 Parkinson's disease: Secondary | ICD-10-CM

## 2017-10-17 DIAGNOSIS — F0281 Dementia in other diseases classified elsewhere with behavioral disturbance: Secondary | ICD-10-CM

## 2017-10-17 DIAGNOSIS — F02818 Dementia in other diseases classified elsewhere, unspecified severity, with other behavioral disturbance: Secondary | ICD-10-CM

## 2017-10-17 NOTE — Progress Notes (Signed)
Reason for visit: Parkinsonism  Dwayne Proch. is an 82 y.o. male  History of present illness:  [name] is an 82 year old right-handed white male with a history of a gait disorder and a memory disorder.  The patient is felt to have parkinsonism, he is on Sinemet and Comtan, he is tolerating the medications well.  He has moved to [facility], he is getting physical therapy 2 or 3 times a week currently.  He still has a significant gait disorder with a tendency to lean backwards.  He will fall on occasion when he tries to get up on his own to go to the bathroom.  The patient has had frequent urinary tract infections associated with increased confusion and combative behavior.  The patient is on several antidepressants and is on Seroquel at night.  The patient returns for an evaluation.  He last had a fall about 2 weeks ago when he tried to get up and go to the bathroom.  Past Medical History:  Diagnosis Date  . Anxiety   . Arthritis   . Benign prostate hyperplasia   . Bipolar disorder ([place])   . C2 cervical fracture ([name])    due to pt fall  . Chronic indwelling Foley catheter   . Depression   . Diabetes mellitus type II   . Falls   . Flu   . Hearing aid worn   . Hypertension   . Memory difficulty 08/21/2014  . Memory loss   . MRSA infection (methicillin-resistant Staphylococcus aureus)   . Neuromuscular disorder ([place])    parkinsons  . Parkinson disease ([place])   . Pneumonia   . SIRS (systemic inflammatory response syndrome) ([name])   . UTI (urinary tract infection)     Past Surgical History:  Procedure Laterality Date  . TOTAL HIP ARTHROPLASTY    . VIDEO ASSISTED THORACOSCOPY (VATS)/EMPYEMA  12/16/2012   Procedure: VIDEO ASSISTED THORACOSCOPY (VATS)/EMPYEMA;  Surgeon: Melrose Nakayama, MD;  Location: [facility];  Service: Thoracic;  Laterality: Right;  Marland Kitchen VIDEO BRONCHOSCOPY  12/16/2012   Procedure: VIDEO BRONCHOSCOPY;  Surgeon: Melrose Nakayama, MD;  Location: [facility];  Service: Thoracic;  Laterality: Right;    Family History  Problem Relation Age of Onset  . Depression Father   . Cancer Mother   . Heart disease Mother     Social history:  reports that he has never smoked. He has never used smokeless tobacco. He reports that he does not drink alcohol or use drugs.    Allergies  Allergen Reactions  . Plant Sterols And Stanols Other (See Comments)    ON MAR    Medications:  Prior to Admission medications   Medication Sig Start Date End Date Taking? Authorizing Provider  acetaminophen (TYLENOL) 325 MG tablet Take 650 mg by mouth every 8 (eight) hours as needed for mild pain.    Yes [provider]  aspirin 81 MG chewable tablet Chew 81 mg by mouth daily.   Yes [provider]  belladonna-opium (B&O SUPPRETTES) 16.2-30 MG suppository Place 1 suppository rectally every 8 (eight) hours as needed for pain. 2/37/62  Yes Delora Fuel, MD  carbidopa-levodopa (SINEMET IR) 25-250 MG per tablet Take 1 tablet by mouth 3 (three) times daily.   Yes [provider]  cephALEXin (KEFLEX) 500 MG capsule Take 1 capsule (500 mg total) by mouth 3 (three) times daily. 09/09/17  Yes Law, Bea Graff, PA-C  ciprofloxacin (CIPRO) 500 MG tablet Take 1 tablet (  500 mg total) by mouth 2 (two) times daily. One po bid x 7 days 08/21/17  Yes Street, [place], PA-C  clonazePAM (KLONOPIN) 0.5 MG tablet Take 0.5 tablets (0.25 mg total) by mouth 2 (two) times daily as needed (anxiety). 08/08/17  Yes Mariel Aloe, MD  clopidogrel (PLAVIX) 75 MG tablet Take 1 tablet (75 mg total) by mouth daily. 10/07/15  Yes Florencia Reasons, MD  doxycycline (VIBRAMYCIN) 100 MG capsule Take 1 capsule (100 mg total) by mouth 2 (two) times daily. 08/23/17  Yes Lajean Saver, MD  entacapone (COMTAN) 200 MG tablet Take 200 mg by mouth 3 (three) times daily.   Yes [provider]  feeding supplement, GLUCERNA SHAKE, (GLUCERNA SHAKE) LIQD Take 237 mLs by mouth 2 (two) times daily  between meals. 03/16/16  Yes Kelvin Cellar, MD  ferrous sulfate 325 (65 FE) MG tablet Take 325 mg by mouth 2 (two) times daily.   Yes [provider]  isosorbide mononitrate (IMDUR) 30 MG 24 hr tablet Take 1 tablet (30 mg total) by mouth daily. 10/07/15  Yes Florencia Reasons, MD  lamoTRIgine (LAMICTAL) 150 MG tablet Take 1 tablet (150 mg total) by mouth 2 (two) times daily. 07/17/17  Yes Arfeen, Arlyce Harman, MD  lisinopril (PRINIVIL,ZESTRIL) 10 MG tablet Take 0.5 tablets (5 mg total) by mouth daily. 10/07/15  Yes Florencia Reasons, MD  Multiple Vitamin (MULTIVITAMIN WITH MINERALS) TABS tablet Take 1 tablet by mouth daily.   Yes [provider]  mupirocin ointment (BACTROBAN) 2 % Place 1 application into the nose 2 (two) times daily. 08/08/17  Yes Mariel Aloe, MD  neomycin-bacitracin-polymyxin (NEOSPORIN) [phone] ointment Apply 1 application topically at bedtime.   Yes [provider]  nystatin (MYCOSTATIN/NYSTOP) powder Apply to affected area of skin in genital area three times daily for at least 2 weeks, or until resolution of yeast infection. 08/21/17  Yes Street, [place], PA-C  pantoprazole (PROTONIX) 40 MG tablet Take 40 mg by mouth daily. Reported on 07/06/2016   Yes [provider]  polyethylene glycol (MIRALAX / GLYCOLAX) packet Take 17 g by mouth daily.   Yes [provider]  predniSONE (DELTASONE) 2.5 MG tablet Take 2.5 mg by mouth daily with breakfast.   Yes [provider]  QUEtiapine (SEROQUEL) 25 MG tablet Take 3 tablets (75 mg total) by mouth at bedtime. 07/17/17  Yes Arfeen, Arlyce Harman, MD  rosuvastatin (CRESTOR) 20 MG tablet Take 20 mg by mouth daily.   Yes [provider]  sertraline (ZOLOFT) 100 MG tablet Take 2 tablets (200 mg total) by mouth daily. Patient taking differently: Take 200 mg by mouth 2 (two) times daily.  07/17/17  Yes Arfeen, Arlyce Harman, MD  traZODone (DESYREL) 50 MG tablet Take 1 tablet (50 mg total) by mouth at bedtime. 01/17/17   Yes Arfeen, Arlyce Harman, MD    ROS:  Out of a complete 14 system review of symptoms, the patient complains only of the following symptoms, and all other reviewed systems are negative.  Eye discharge Daytime sleepiness Frequent infections Blood in the urine, testicular pain Joint pain Skin wounds Bruising easily Memory loss, speech difficulty, weakness Behavior problems, confusion, depression, anxiety  Blood pressure 115/60, pulse 81, weight 169 lb 8 oz (76.9 kg), SpO2 95 %.  Physical Exam  General: The patient is alert and cooperative at the time of the examination.  Skin: No significant peripheral edema is noted.   Neurologic Exam  Mental status: The patient is alert and oriented x  2 at the time of the examination (not oriented to date). The Mini-Mental status examination done today shows a total score of 16/30.   Cranial nerves: Facial symmetry is present. Speech is normal, no aphasia or dysarthria is noted. Extraocular movements are full. Visual fields are full.  Motor: The patient has good strength in all 4 extremities.  Sensory examination: Soft touch sensation is symmetric on the face, arms, and legs.  Coordination: The patient has good finger-nose-finger bilaterally, but the patient has apraxia with use of the lower extremities for heel to shin testing.  Gait and station: The patient can stand up with assistance, he has a tendency to lean backwards.  He can take a few steps with assistance, he has a wide-based gait, he will tend to collapse with the knees.  Reflexes: Deep tendon reflexes are symmetric.   Assessment/Plan:  1.  Parkinsonism  2.  Gait disorder  3.  Memory disorder  The memory issues are relatively stable over time.  His walking has not changed much since last seen.  We may try to taper him off of Sinemet on the next visit.  He will follow-up in 6 months.  He did not clearly respond to the lumbar puncture looking for normal pressure  hydrocephalus.  Jill Alexanders MD 10/17/2017 10:38 AM  [facility] [address] [place],  [address]  Phone [phone] Fax [phone]

## 2017-10-21 ENCOUNTER — Encounter (HOSPITAL_COMMUNITY): Payer: Self-pay

## 2017-10-21 ENCOUNTER — Other Ambulatory Visit: Payer: Self-pay

## 2017-10-21 ENCOUNTER — Emergency Department (HOSPITAL_COMMUNITY)
Admission: EM | Admit: 2017-10-21 | Discharge: 2017-10-21 | Disposition: A | Discharge status: Home / Self Care | Payer: Medicare Other | Attending: Emergency Medicine | Admitting: Emergency Medicine

## 2017-10-21 DIAGNOSIS — E119 Type 2 diabetes mellitus without complications: Secondary | ICD-10-CM | POA: Insufficient documentation

## 2017-10-21 DIAGNOSIS — R310 Gross hematuria: Secondary | ICD-10-CM | POA: Insufficient documentation

## 2017-10-21 DIAGNOSIS — Z79899 Other long term (current) drug therapy: Secondary | ICD-10-CM | POA: Insufficient documentation

## 2017-10-21 DIAGNOSIS — I10 Essential (primary) hypertension: Secondary | ICD-10-CM | POA: Insufficient documentation

## 2017-10-21 DIAGNOSIS — Z7984 Long term (current) use of oral hypoglycemic drugs: Secondary | ICD-10-CM | POA: Insufficient documentation

## 2017-10-21 DIAGNOSIS — F0391 Unspecified dementia with behavioral disturbance: Secondary | ICD-10-CM | POA: Insufficient documentation

## 2017-10-21 DIAGNOSIS — Z7982 Long term (current) use of aspirin: Secondary | ICD-10-CM | POA: Insufficient documentation

## 2017-10-21 DIAGNOSIS — Z96643 Presence of artificial hip joint, bilateral: Secondary | ICD-10-CM | POA: Insufficient documentation

## 2017-10-21 LAB — CBC WITH DIFFERENTIAL/PLATELET
Basophils Absolute: 0 10*3/uL (ref 0.0–0.1)
Basophils Relative: 0 %
Eosinophils Absolute: 0.7 10*3/uL (ref 0.0–0.7)
Eosinophils Relative: 9 %
HCT: 36.2 % — ABNORMAL LOW (ref 39.0–52.0)
HEMOGLOBIN: 11.8 g/dL — AB (ref 13.0–17.0)
LYMPHS ABS: 1.3 10*3/uL (ref 0.7–4.0)
Lymphocytes Relative: 16 %
MCH: 28.6 pg (ref 26.0–34.0)
MCHC: 32.6 g/dL (ref 30.0–36.0)
MCV: 87.7 fL (ref 78.0–100.0)
Monocytes Absolute: 0.7 10*3/uL (ref 0.1–1.0)
Monocytes Relative: 8 %
NEUTROS PCT: 67 %
Neutro Abs: 5.7 10*3/uL (ref 1.7–7.7)
Platelets: 220 10*3/uL (ref 150–400)
RBC: 4.13 MIL/uL — AB (ref 4.22–5.81)
RDW: 15.1 % (ref 11.5–15.5)
WBC: 8.4 10*3/uL (ref 4.0–10.5)

## 2017-10-21 LAB — BASIC METABOLIC PANEL
Anion gap: 7 (ref 5–15)
BUN: 26 mg/dL — AB (ref 6–20)
CHLORIDE: 103 mmol/L (ref 101–111)
CO2: 28 mmol/L (ref 22–32)
Calcium: 8.7 mg/dL — ABNORMAL LOW (ref 8.9–10.3)
Creatinine, Ser: 1.04 mg/dL (ref 0.61–1.24)
GFR calc non Af Amer: >60 mL/min (ref >60)
Glucose, Bld: 82 mg/dL (ref 65–99)
POTASSIUM: 4.4 mmol/L (ref 3.5–5.1)
Sodium: 138 mmol/L (ref 135–145)

## 2017-10-21 LAB — URINALYSIS, ROUTINE W REFLEX MICROSCOPIC
BILIRUBIN URINE: NEGATIVE
Bacteria, UA: NONE SEEN
Glucose, UA: NEGATIVE mg/dL
Ketones, ur: NEGATIVE mg/dL
Nitrite: POSITIVE — AB
Protein, ur: 100 mg/dL — AB
SPECIFIC GRAVITY, URINE: 1.011 (ref 1.005–1.030)
SQUAMOUS EPITHELIAL / LPF: NONE SEEN
pH: 8 (ref 5.0–8.0)

## 2017-10-21 NOTE — ED Provider Notes (Signed)
[facility] DEPT Provider Note   CSN: [id number] Arrival date & time: 10/21/17  1944     History   Chief Complaint Chief Complaint  Patient presents with  . urinary catheter issues    HPI [name]. is a 82 y.o. male.  HPI Patient with chronic indwelling Foley catheter for BPH.  Sent from nursing home for hematuria and bleeding around the catheter.  No known trauma.  Patient has history of dementia and is limited historian.  Level 5 caveat.  Currently denying any pain. Past Medical History:  Diagnosis Date  . Anxiety   . Arthritis   . Benign prostate hyperplasia   . Bipolar disorder ([place])   . C2 cervical fracture ([name])    due to pt fall  . Chronic indwelling Foley catheter   . Depression   . Diabetes mellitus type II   . Falls   . Flu   . Hearing aid worn   . Hypertension   . Memory difficulty 08/21/2014  . Memory loss   . MRSA infection (methicillin-resistant Staphylococcus aureus)   . Neuromuscular disorder ([place])    parkinsons  . Parkinson disease ([place])   . Pneumonia   . SIRS (systemic inflammatory response syndrome) ([name])   . UTI (urinary tract infection)     Patient Active Problem List   Diagnosis Date Noted  . 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 Pyelonephritis 08/06/2017  . Contusion of head 08/06/2017  . Pressure injury of skin 08/06/2017  . Complicated UTI (urinary tract infection) 08/05/2017  . CAP (community acquired pneumonia) 11/29/2016  . Bacteremia 03/14/2016  . HCAP (healthcare-associated pneumonia)   . Pressure ulcer 10/01/2015  . Right lower lobe pneumonia ([place]) 10/01/2015  . Sepsis due to Gram-negative organism with acute respiratory failure ([place]) 10/01/2015  . Acute renal failure ([place]) 10/01/2015  . Acute respiratory failure with hypoxia ([place]) 09/30/2015  . Influenza A (H1N1) 03/25/2015  . Dementia without behavioral disturbance 03/25/2015  .  Hyperkalemia 03/25/2015  . Falls 03/16/2015  . Sacral pressure ulcer 03/16/2015  . Fever 03/16/2015  . Memory difficulty 08/21/2014  . Malnutrition of moderate degree ([place]) 04/19/2014  . UTI (lower urinary tract infection) 04/18/2014  . Hypoxia 04/18/2014  . PNA (pneumonia) 04/18/2014  . Chronic diastolic congestive heart failure ([place]) 02/01/2013  . Pericardial effusion 02/01/2013  . Bradycardia 01/31/2013  . Empyema lung ([place]) 12/23/2012  . S/P thoracotomy 12/23/2012  . Abnormality of gait 12/05/2012  . Hypernatremia 08/13/2012  . Hypertension   . Diabetes mellitus, type 2 ([place])   . Arthritis   . Bipolar disorder ([place])   . Anxiety   . Chronic indwelling Foley catheter   . Neuromuscular disorder ([place])   . Bacteremia of undetermined etiology 06/17/2012  . Leukocytosis 04/17/2012  . Parkinson disease ([place]) 04/16/2012  . Elevated troponin 04/16/2012  . Hearing aid worn   . Diabetes mellitus ([place]) 07/06/2011    Past Surgical History:  Procedure Laterality Date  . TOTAL HIP ARTHROPLASTY         Home Medications    Prior to Admission medications   Medication Sig Start Date End Date Taking? Authorizing Provider  acetaminophen (TYLENOL) 325 MG tablet Take 650 mg by mouth every 8 (eight) hours as needed for mild pain.     [provider]  aspirin 81 MG chewable tablet Chew 81 mg by mouth daily.    [provider]  belladonna-opium (B&O SUPPRETTES) 16.2-30  MG suppository Place 1 suppository rectally every 8 (eight) hours as needed for pain. 9/73/53   Delora Fuel, MD  carbidopa-levodopa (SINEMET IR) 25-250 MG per tablet Take 1 tablet by mouth 3 (three) times daily.    [provider]  cephALEXin (KEFLEX) 500 MG capsule Take 1 capsule (500 mg total) by mouth 3 (three) times daily. 09/09/17   Law, Bea Graff, PA-C  ciprofloxacin (CIPRO) 500 MG tablet Take 1 tablet (500 mg total) by mouth 2 (two) times daily. One po bid x 7 days 08/21/17   Street, [place],  PA-C  clonazePAM (KLONOPIN) 0.5 MG tablet Take 0.5 tablets (0.25 mg total) by mouth 2 (two) times daily as needed (anxiety). 08/08/17   Mariel Aloe, MD  clopidogrel (PLAVIX) 75 MG tablet Take 1 tablet (75 mg total) by mouth daily. 10/07/15   Florencia Reasons, MD  doxycycline (VIBRAMYCIN) 100 MG capsule Take 1 capsule (100 mg total) by mouth 2 (two) times daily. 08/23/17   Lajean Saver, MD  entacapone (COMTAN) 200 MG tablet Take 200 mg by mouth 3 (three) times daily.    [provider]  feeding supplement, GLUCERNA SHAKE, (GLUCERNA SHAKE) LIQD Take 237 mLs by mouth 2 (two) times daily between meals. 03/16/16   Kelvin Cellar, MD  ferrous sulfate 325 (65 FE) MG tablet Take 325 mg by mouth 2 (two) times daily.    [provider]  isosorbide mononitrate (IMDUR) 30 MG 24 hr tablet Take 1 tablet (30 mg total) by mouth daily. 10/07/15   Florencia Reasons, MD  lamoTRIgine (LAMICTAL) 150 MG tablet Take 1 tablet (150 mg total) by mouth 2 (two) times daily. 07/17/17   Arfeen, Arlyce Harman, MD  lisinopril (PRINIVIL,ZESTRIL) 10 MG tablet Take 0.5 tablets (5 mg total) by mouth daily. 10/07/15   Florencia Reasons, MD  Multiple Vitamin (MULTIVITAMIN WITH MINERALS) TABS tablet Take 1 tablet by mouth daily.    [provider]  mupirocin ointment (BACTROBAN) 2 % Place 1 application into the nose 2 (two) times daily. 08/08/17   Mariel Aloe, MD  neomycin-bacitracin-polymyxin (NEOSPORIN) [phone] ointment Apply 1 application topically at bedtime.    [provider]  nystatin (MYCOSTATIN/NYSTOP) powder Apply to affected area of skin in genital area three times daily for at least 2 weeks, or until resolution of yeast infection. 08/21/17   Street, [place], PA-C  pantoprazole (PROTONIX) 40 MG tablet Take 40 mg by mouth daily. Reported on 07/06/2016    [provider]  polyethylene glycol (MIRALAX / GLYCOLAX) packet Take 17 g by mouth daily.    [provider]  predniSONE (DELTASONE) 2.5 MG tablet  Take 2.5 mg by mouth daily with breakfast.    [provider]  QUEtiapine (SEROQUEL) 25 MG tablet Take 3 tablets (75 mg total) by mouth at bedtime. 07/17/17   Arfeen, Arlyce Harman, MD  rosuvastatin (CRESTOR) 20 MG tablet Take 20 mg by mouth daily.    [provider]  sertraline (ZOLOFT) 100 MG tablet Take 2 tablets (200 mg total) by mouth daily. Patient taking differently: Take 200 mg by mouth 2 (two) times daily.  07/17/17   Arfeen, Arlyce Harman, MD  traZODone (DESYREL) 50 MG tablet Take 1 tablet (50 mg total) by mouth at bedtime. 01/17/17   Arfeen, Arlyce Harman, MD    Family History Family History  Problem Relation Age of Onset  . Depression Father   . Cancer Mother   . Heart disease Mother     Social History Social History  Tobacco Use  . Smoking status: Never Smoker  . Smokeless tobacco: Never Used  Substance Use Topics  . Alcohol use: No    Alcohol/week: 0.0 oz    Comment: occasional  . Drug use: No     Allergies   Plant sterols and stanols   Review of Systems Review of Systems  Unable to perform ROS: Dementia  Gastrointestinal: Negative for abdominal pain and vomiting.  Genitourinary: Positive for hematuria. Negative for penile pain and testicular pain.  Skin: Negative for wound.     Physical Exam Updated Vital Signs BP (!) 168/91 (BP Location: Left Arm)   Pulse 70   Temp 98.1 F (36.7 C) (Oral)   Resp 18   Wt 77 kg (169 lb 12.8 oz)   SpO2 95%   BMI 24.36 kg/m   Physical Exam  Constitutional: He appears well-developed and well-nourished. No distress.  HENT:  Head: Normocephalic and atraumatic.  Mouth/Throat: Oropharynx is clear and moist.  Eyes: EOM are normal. Pupils are equal, round, and reactive to light.  Neck: Normal range of motion. Neck supple.  Cardiovascular: Normal rate and regular rhythm.  Pulmonary/Chest: Effort normal and breath sounds normal.  Abdominal: Soft. Bowel sounds are normal. There is no tenderness. There is no rebound and no  guarding.  Genitourinary:  Genitourinary Comments: Foley catheter in place.  There is small amount of bleeding around the catheter.  No obvious trauma to the urethral meatus.  Blood-tinged urine in catheter bag.  Musculoskeletal: Normal range of motion. He exhibits no edema or tenderness.  Neurological: He is alert.  Oriented to person.  Moving all extremities without deficit.  Sensation intact  Skin: Skin is warm and dry. Capillary refill takes less than 2 seconds. No rash noted. No erythema.  Psychiatric: He has a normal mood and affect. His behavior is normal.  Nursing note and vitals reviewed.    ED Treatments / Results  Labs (all labs ordered are listed, but only abnormal results are displayed) Labs Reviewed  URINALYSIS, ROUTINE W REFLEX MICROSCOPIC - Abnormal; Notable for the following components:      Result Value   APPearance CLOUDY (*)    Hgb urine dipstick LARGE (*)    Protein, ur 100 (*)    Nitrite POSITIVE (*)    Leukocytes, UA MODERATE (*)    All other components within normal limits  CBC WITH DIFFERENTIAL/PLATELET - Abnormal; Notable for the following components:   RBC 4.13 (*)    Hemoglobin 11.8 (*)    HCT 36.2 (*)    All other components within normal limits  BASIC METABOLIC PANEL - Abnormal; Notable for the following components:   BUN 26 (*)    Calcium 8.7 (*)    All other components within normal limits  URINE CULTURE    EKG  EKG Interpretation None       Radiology No results found.  Procedures Procedures (including critical care time)  Medications Ordered in ED Medications - No data to display   Initial Impression / Assessment and Plan / ED Course  I have reviewed the triage vital signs and the nursing notes.  Pertinent labs & imaging results that were available during my care of the patient were reviewed by me and considered in my medical decision making (see chart for details).     Foley catheter draining well.  No obvious injury.   Patient is asymptomatic.  Hemoglobin stable.  Will have follow-up with urology as an outpatient.  Urine has been sent  for culture.  Final Clinical Impressions(s) / ED Diagnoses   Final diagnoses:  Gross hematuria    ED Discharge Orders    None       Julianne Rice, MD 10/21/17 2204

## 2017-10-21 NOTE — ED Notes (Signed)
[facility] called for transport.  

## 2017-10-21 NOTE — ED Notes (Signed)
Bed: BX03 Expected date:  Expected time:  Means of arrival:  Comments: Bleeding from catheter

## 2017-10-21 NOTE — ED Triage Notes (Signed)
Pt BIB EMS from [facility] for bloody urine in a foley catheter and penile bleeding. Pt denies pain.

## 2017-10-25 LAB — URINE CULTURE

## 2017-10-26 ENCOUNTER — Telehealth: Payer: Self-pay

## 2017-10-26 NOTE — Telephone Encounter (Signed)
UC report faxed to [facility] [phone]  ED visit 10/21/17

## 2017-11-29 ENCOUNTER — Ambulatory Visit (INDEPENDENT_AMBULATORY_CARE_PROVIDER_SITE_OTHER): Payer: BLUE CROSS/BLUE SHIELD | Admitting: Podiatry

## 2017-11-29 ENCOUNTER — Encounter: Payer: Self-pay | Admitting: Podiatry

## 2017-11-29 DIAGNOSIS — M79676 Pain in unspecified toe(s): Secondary | ICD-10-CM

## 2017-11-29 DIAGNOSIS — B351 Tinea unguium: Secondary | ICD-10-CM

## 2017-11-29 DIAGNOSIS — R52 Pain, unspecified: Secondary | ICD-10-CM

## 2017-11-29 NOTE — Progress Notes (Signed)
Subjective:  y.o. returns the office today for painful, elongated, thickened toenails which they cannot trim themself. Denies any redness or drainage around the nails. Denies any acute changes since last appointment and no new complaints today. Denies any systemic complaints such as fevers, chills, nausea, vomiting.   Objective: NAD DP/PT pulses palpable, CRT less than 3 seconds Nails hypertrophic, dystrophic, elongated, brittle, discolored 10. There is tenderness overlying the nails 1-5 bilaterally. There is no surrounding erythema or drainage along the nail sites. No open lesions or pre-ulcerative lesions are identified. No other areas of tenderness bilateral lower extremities. No overlying edema, erythema, increased warmth. No pain with calf compression, swelling, warmth, erythema.  Assessment: Patient presents with symptomatic onychomycosis  Plan: -Treatment options including alternatives, risks, complications were discussed -Nails sharply debrided 10 without complication/bleeding. -He was again asking about treatments for nail fungus. We will do a topical anti-fungal treatment through [facility]. Discussed success rates.  -Discussed daily foot inspection. If there are any changes, to call the office immediately.  -Follow-up in 3 months or sooner if any problems are to arise. In the meantime, encouraged to call the office with any questions, concerns, changes symptoms.  Celesta Gentile, DPM

## 2017-11-30 ENCOUNTER — Emergency Department (HOSPITAL_COMMUNITY)
Admission: EM | Admit: 2017-11-30 | Discharge: 2017-11-30 | Disposition: A | Discharge status: Home / Self Care | Payer: BLUE CROSS/BLUE SHIELD | Attending: Emergency Medicine | Admitting: Emergency Medicine

## 2017-11-30 ENCOUNTER — Encounter (HOSPITAL_COMMUNITY): Payer: Self-pay | Admitting: Emergency Medicine

## 2017-11-30 ENCOUNTER — Other Ambulatory Visit: Payer: Self-pay

## 2017-11-30 DIAGNOSIS — G20 Parkinson's disease: Secondary | ICD-10-CM | POA: Insufficient documentation

## 2017-11-30 DIAGNOSIS — I5032 Chronic diastolic (congestive) heart failure: Secondary | ICD-10-CM | POA: Insufficient documentation

## 2017-11-30 DIAGNOSIS — E119 Type 2 diabetes mellitus without complications: Secondary | ICD-10-CM | POA: Insufficient documentation

## 2017-11-30 DIAGNOSIS — Z79899 Other long term (current) drug therapy: Secondary | ICD-10-CM | POA: Insufficient documentation

## 2017-11-30 DIAGNOSIS — Z7982 Long term (current) use of aspirin: Secondary | ICD-10-CM | POA: Insufficient documentation

## 2017-11-30 DIAGNOSIS — Z96649 Presence of unspecified artificial hip joint: Secondary | ICD-10-CM | POA: Insufficient documentation

## 2017-11-30 DIAGNOSIS — Y828 Other medical devices associated with adverse incidents: Secondary | ICD-10-CM | POA: Insufficient documentation

## 2017-11-30 DIAGNOSIS — Z7902 Long term (current) use of antithrombotics/antiplatelets: Secondary | ICD-10-CM | POA: Insufficient documentation

## 2017-11-30 DIAGNOSIS — I110 Hypertensive heart disease with heart failure: Secondary | ICD-10-CM | POA: Insufficient documentation

## 2017-11-30 DIAGNOSIS — T83021A Displacement of indwelling urethral catheter, initial encounter: Secondary | ICD-10-CM

## 2017-11-30 DIAGNOSIS — F0390 Unspecified dementia without behavioral disturbance: Secondary | ICD-10-CM | POA: Insufficient documentation

## 2017-11-30 NOTE — ED Notes (Signed)
PTAR made aware of transport needed back to [facility]

## 2017-11-30 NOTE — ED Triage Notes (Signed)
Pt BIB EMS after pulling out his foley catheter. Pt hx of dementia.

## 2017-11-30 NOTE — ED Provider Notes (Signed)
[facility] DEPT Provider Note   CSN: [id number] Arrival date & time: 11/30/17  6440     History   Chief Complaint Chief Complaint  Patient presents with  . Pulled Out Catheter    HPI [name]. is a 82 y.o. male.  HPI   82year-old male with past medical history as below here after actually pulling out his Foley catheter.  Patient has a history of the same.  He has a history of BPH and is chronically Foley dependent.  He is agitated that he is here but unable to tell me why.  He focuses on that he still has his retainer in which means he has not been fed today.  He denies any overt penile pain.  Level 5 caveat invoked as remainder of history, ROS, and physical exam limited due to patient's dementia.   Past Medical History:  Diagnosis Date  . Anxiety   . Arthritis   . Benign prostate hyperplasia   . Bipolar disorder ([place])   . C2 cervical fracture ([name])    due to pt fall  . Chronic indwelling Foley catheter   . Depression   . Diabetes mellitus type II   . Falls   . Flu   . Hearing aid worn   . Hypertension   . Memory difficulty 08/21/2014  . Memory loss   . MRSA infection (methicillin-resistant Staphylococcus aureus)   . Neuromuscular disorder ([place])    parkinsons  . Parkinson disease ([place])   . Pneumonia   . SIRS (systemic inflammatory response syndrome) ([name])   . UTI (urinary tract infection)     Patient Active Problem List   Diagnosis Date Noted  . 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 Pyelonephritis 08/06/2017  . Contusion of head 08/06/2017  . Pressure injury of skin 08/06/2017  . Complicated UTI (urinary tract infection) 08/05/2017  . CAP (community acquired pneumonia) 11/29/2016  . Bacteremia 03/14/2016  . HCAP (healthcare-associated pneumonia)   . Pressure ulcer 10/01/2015  . Right lower lobe pneumonia ([place]) 10/01/2015  . Sepsis due to Gram-negative organism with  acute respiratory failure ([place]) 10/01/2015  . Acute renal failure ([place]) 10/01/2015  . Acute respiratory failure with hypoxia ([place]) 09/30/2015  . Influenza A (H1N1) 03/25/2015  . Dementia without behavioral disturbance 03/25/2015  . Hyperkalemia 03/25/2015  . Falls 03/16/2015  . Sacral pressure ulcer 03/16/2015  . Fever 03/16/2015  . Memory difficulty 08/21/2014  . Malnutrition of moderate degree ([place]) 04/19/2014  . UTI (lower urinary tract infection) 04/18/2014  . Hypoxia 04/18/2014  . PNA (pneumonia) 04/18/2014  . Chronic diastolic congestive heart failure ([place]) 02/01/2013  . Pericardial effusion 02/01/2013  . Bradycardia 01/31/2013  . Empyema lung ([place]) 12/23/2012  . S/P thoracotomy 12/23/2012  . Abnormality of gait 12/05/2012  . Hypernatremia 08/13/2012  . Hypertension   . Diabetes mellitus, type 2 ([place])   . Arthritis   . Bipolar disorder ([place])   . Anxiety   . Chronic indwelling Foley catheter   . Neuromuscular disorder ([place])   . Bacteremia of undetermined etiology 06/17/2012  . Leukocytosis 04/17/2012  . Parkinson disease ([place]) 04/16/2012  . Elevated troponin 04/16/2012  . Hearing aid worn   . Diabetes mellitus ([place]) 07/06/2011    Past Surgical History:  Procedure Laterality Date  . TOTAL HIP ARTHROPLASTY    . VIDEO ASSISTED THORACOSCOPY (VATS)/EMPYEMA  12/16/2012   Procedure: VIDEO ASSISTED THORACOSCOPY (VATS)/EMPYEMA;  Surgeon: Melrose Nakayama, MD;  Location: [facility];  Service: Thoracic;  Laterality: Right;  Marland Kitchen VIDEO BRONCHOSCOPY  12/16/2012   Procedure: VIDEO BRONCHOSCOPY;  Surgeon: Melrose Nakayama, MD;  Location: [facility];  Service: Thoracic;  Laterality: Right;       Home Medications    Prior to Admission medications   Medication Sig Start Date End Date Taking? Authorizing Provider  acetaminophen (TYLENOL) 325 MG tablet Take 650 mg by mouth every 8 (eight) hours as needed for mild pain.     [provider]  aspirin 81 MG chewable tablet Chew  81 mg by mouth daily.    [provider]  belladonna-opium (B&O SUPPRETTES) 16.2-30 MG suppository Place 1 suppository rectally every 8 (eight) hours as needed for pain. 06/19/49   Delora Fuel, MD  carbidopa-levodopa (SINEMET IR) 25-250 MG per tablet Take 1 tablet by mouth 3 (three) times daily.    [provider]  cephALEXin (KEFLEX) 500 MG capsule Take 1 capsule (500 mg total) by mouth 3 (three) times daily. 09/09/17   Law, Bea Graff, PA-C  ciprofloxacin (CIPRO) 500 MG tablet Take 1 tablet (500 mg total) by mouth 2 (two) times daily. One po bid x 7 days 08/21/17   Street, [place], PA-C  clonazePAM (KLONOPIN) 0.5 MG tablet Take 0.5 tablets (0.25 mg total) by mouth 2 (two) times daily as needed (anxiety). 08/08/17   Mariel Aloe, MD  clopidogrel (PLAVIX) 75 MG tablet Take 1 tablet (75 mg total) by mouth daily. 10/07/15   Florencia Reasons, MD  doxycycline (VIBRAMYCIN) 100 MG capsule Take 1 capsule (100 mg total) by mouth 2 (two) times daily. 08/23/17   Lajean Saver, MD  entacapone (COMTAN) 200 MG tablet Take 200 mg by mouth 3 (three) times daily.    [provider]  feeding supplement, GLUCERNA SHAKE, (GLUCERNA SHAKE) LIQD Take 237 mLs by mouth 2 (two) times daily between meals. 03/16/16   Kelvin Cellar, MD  ferrous sulfate 325 (65 FE) MG tablet Take 325 mg by mouth 2 (two) times daily.    [provider]  isosorbide mononitrate (IMDUR) 30 MG 24 hr tablet Take 1 tablet (30 mg total) by mouth daily. 10/07/15   Florencia Reasons, MD  lamoTRIgine (LAMICTAL) 150 MG tablet Take 1 tablet (150 mg total) by mouth 2 (two) times daily. 07/17/17   Arfeen, Arlyce Harman, MD  lisinopril (PRINIVIL,ZESTRIL) 10 MG tablet Take 0.5 tablets (5 mg total) by mouth daily. 10/07/15   Florencia Reasons, MD  Multiple Vitamin (MULTIVITAMIN WITH MINERALS) TABS tablet Take 1 tablet by mouth daily.    [provider]  mupirocin ointment (BACTROBAN) 2 % Place 1 application into the nose 2 (two) times daily. 08/08/17    Mariel Aloe, MD  neomycin-bacitracin-polymyxin (NEOSPORIN) [phone] ointment Apply 1 application topically at bedtime.    [provider]  NON FORMULARY [facility]  Onychomycosis Nail Lacquer -  Fluconazole 2%, Terbinafine 1% DMSO Apply to affected nail once daily Qty. 120 gm 3 refills    [provider]  nystatin (MYCOSTATIN/NYSTOP) powder Apply to affected area of skin in genital area three times daily for at least 2 weeks, or until resolution of yeast infection. 08/21/17   Street, Mercedes, PA-C  pantoprazole (PROTONIX) 40 MG tablet Take 40 mg by mouth daily. Reported on 07/06/2016    [provider]  polyethylene glycol (MIRALAX / GLYCOLAX) packet Take 17 g by mouth daily.    [provider]  predniSONE (DELTASONE) 2.5 MG tablet Take 2.5 mg by  mouth daily with breakfast.    [provider]  QUEtiapine (SEROQUEL) 25 MG tablet Take 3 tablets (75 mg total) by mouth at bedtime. 07/17/17   Arfeen, Arlyce Harman, MD  rosuvastatin (CRESTOR) 20 MG tablet Take 20 mg by mouth daily.    [provider]  sertraline (ZOLOFT) 100 MG tablet Take 2 tablets (200 mg total) by mouth daily. Patient taking differently: Take 200 mg by mouth 2 (two) times daily.  07/17/17   Arfeen, Arlyce Harman, MD  traZODone (DESYREL) 50 MG tablet Take 1 tablet (50 mg total) by mouth at bedtime. 01/17/17   Arfeen, Arlyce Harman, MD    Family History Family History  Problem Relation Age of Onset  . Depression Father   . Cancer Mother   . Heart disease Mother     Social History Social History   Tobacco Use  . Smoking status: Never Smoker  . Smokeless tobacco: Never Used  Substance Use Topics  . Alcohol use: No    Alcohol/week: 0.0 oz    Comment: occasional  . Drug use: No     Allergies   Plant sterols and stanols   Review of Systems Review of Systems  Unable to perform ROS: Dementia     Physical Exam Updated Vital Signs BP (!) 156/84 (BP Location: Right Arm)    Pulse 61   Temp 97.7 F (36.5 C)   Resp 18   SpO2 97%   Physical Exam  Constitutional: He is oriented to person, place, and time. He appears well-developed and well-nourished. No distress.  HENT:  Head: Normocephalic and atraumatic.  Eyes: Conjunctivae are normal.  Neck: Neck supple.  Cardiovascular: Normal rate, regular rhythm and normal heart sounds. Exam reveals no friction rub.  No murmur heard. Pulmonary/Chest: Effort normal and breath sounds normal. No respiratory distress. He has no wheezes. He has no rales.  Abdominal: He exhibits no distension.  Genitourinary:  Genitourinary Comments: Small amount of dried blood in patient's underwear.  No active bleeding from the urethral meatus.  No evidence of significant trauma.  Musculoskeletal: He exhibits no edema.  Multiple old ecchymoses in various stages of healing  Neurological: He is alert and oriented to person, place, and time. He exhibits normal muscle tone.  Skin: Skin is warm. Capillary refill takes less than 2 seconds.  Psychiatric: He has a normal mood and affect.  Nursing note and vitals reviewed.    ED Treatments / Results  Labs (all labs ordered are listed, but only abnormal results are displayed) Labs Reviewed - No data to display  EKG  EKG Interpretation None       Radiology No results found.  Procedures Procedures (including critical care time)  Medications Ordered in ED Medications - No data to display   Initial Impression / Assessment and Plan / ED Course  I have reviewed the triage vital signs and the nursing notes.  Pertinent labs & imaging results that were available during my care of the patient were reviewed by me and considered in my medical decision making (see chart for details).     83 year old male here with accidental dislodgment of his own catheter.  Patient has history of dementia with similar presentations.  He does have a small amount of blood in his underwear, but no other  evidence of active bleeding or significant penile trauma.  Foley was replaced.  He is otherwise at his baseline state of health.  His vital signs are stable here.  Will DC back to  facility with outpatient urology follow-up.  Final Clinical Impressions(s) / ED Diagnoses   Final diagnoses:  Dislodged Foley catheter, initial encounter [facility])    ED Discharge Orders    None       Duffy Bruce, MD 11/30/17 1700

## 2017-12-11 ENCOUNTER — Other Ambulatory Visit: Payer: Self-pay

## 2017-12-11 ENCOUNTER — Encounter (HOSPITAL_COMMUNITY): Payer: Self-pay

## 2017-12-11 ENCOUNTER — Emergency Department (HOSPITAL_COMMUNITY)
Admission: EM | Admit: 2017-12-11 | Discharge: 2017-12-11 | Disposition: A | Discharge status: Home / Self Care | Payer: BLUE CROSS/BLUE SHIELD | Attending: Emergency Medicine | Admitting: Emergency Medicine

## 2017-12-11 DIAGNOSIS — Y828 Other medical devices associated with adverse incidents: Secondary | ICD-10-CM | POA: Diagnosis not present

## 2017-12-11 DIAGNOSIS — T8384XA Pain from genitourinary prosthetic devices, implants and grafts, initial encounter: Secondary | ICD-10-CM | POA: Insufficient documentation

## 2017-12-11 DIAGNOSIS — I5032 Chronic diastolic (congestive) heart failure: Secondary | ICD-10-CM | POA: Insufficient documentation

## 2017-12-11 DIAGNOSIS — F0390 Unspecified dementia without behavioral disturbance: Secondary | ICD-10-CM | POA: Diagnosis not present

## 2017-12-11 DIAGNOSIS — Z7982 Long term (current) use of aspirin: Secondary | ICD-10-CM | POA: Insufficient documentation

## 2017-12-11 DIAGNOSIS — Z79899 Other long term (current) drug therapy: Secondary | ICD-10-CM | POA: Diagnosis not present

## 2017-12-11 DIAGNOSIS — Z960 Presence of urogenital implants: Secondary | ICD-10-CM

## 2017-12-11 DIAGNOSIS — E119 Type 2 diabetes mellitus without complications: Secondary | ICD-10-CM | POA: Insufficient documentation

## 2017-12-11 DIAGNOSIS — I110 Hypertensive heart disease with heart failure: Secondary | ICD-10-CM | POA: Insufficient documentation

## 2017-12-11 DIAGNOSIS — Z978 Presence of other specified devices: Secondary | ICD-10-CM

## 2017-12-11 LAB — URINALYSIS, ROUTINE W REFLEX MICROSCOPIC
Bilirubin Urine: NEGATIVE
GLUCOSE, UA: NEGATIVE mg/dL
HGB URINE DIPSTICK: NEGATIVE
KETONES UR: 5 mg/dL — AB
NITRITE: NEGATIVE
PH: 6 (ref 5.0–8.0)
Protein, ur: NEGATIVE mg/dL
SQUAMOUS EPITHELIAL / LPF: NONE SEEN
Specific Gravity, Urine: 1.014 (ref 1.005–1.030)

## 2017-12-11 LAB — CBG MONITORING, ED: Glucose-Capillary: 90 mg/dL (ref 65–99)

## 2017-12-11 NOTE — ED Triage Notes (Signed)
Pt from comes from [facility], pt called 911 because pt stated that facility refused to call. [facility] showed up and pt was able to ambulate to stretcher with 2 person assist. Pt is somewhat confused. Pt does not have any Hx of dementia. Pt does repeat questions over and over again. Pt is AOx2.

## 2017-12-11 NOTE — ED Notes (Signed)
[facility] called and in route.

## 2017-12-11 NOTE — ED Notes (Signed)
Changed leg bag. Will obtain urine when present.

## 2017-12-11 NOTE — ED Notes (Signed)
Pt stated he does not have any pain but now says he has a funny feeling in toes bilateral.

## 2017-12-11 NOTE — ED Notes (Signed)
Bed: WA20 Expected date:  Expected time:  Means of arrival:  Comments: EMS-cath pain

## 2017-12-11 NOTE — ED Provider Notes (Signed)
[facility] DEPT Provider Note   CSN: [id number] Arrival date & time: 12/11/17  1515     History   Chief Complaint Chief Complaint  Patient presents with  . Indwelling Urinary Catheter Pain    HPI [name]. is a 82 y.o. male.  HPI Level 5 caveat due to dementia. Patient comes from [facility] assisted living.  Reportedly called 911 because the assisted living would not call  an ambulance for him.  Reportedly was initially complaining of pain with his catheter.  Later told nurses that he had a funny feeling in his toes.  He is without complaints for me.  He has a chronic indwelling catheter.  Has had frequent visits to the ER.  Also does tend to get urinary tract infections with some confusion but does have baseline dementia.  Baseline A and O x2 and that is about where he is right now.  Confused to the year but is able to tell me the president and where he is at.  No abdominal pain.  Denies dysuria.  Denies fevers. Past Medical History:  Diagnosis Date  . Anxiety   . Arthritis   . Benign prostate hyperplasia   . Bipolar disorder ([place])   . C2 cervical fracture ([name])    due to pt fall  . Chronic indwelling Foley catheter   . Depression   . Diabetes mellitus type II   . Falls   . Flu   . Hearing aid worn   . Hypertension   . Memory difficulty 08/21/2014  . Memory loss   . MRSA infection (methicillin-resistant Staphylococcus aureus)   . Neuromuscular disorder ([place])    parkinsons  . Parkinson disease ([place])   . Pneumonia   . SIRS (systemic inflammatory response syndrome) ([name])   . UTI (urinary tract infection)     Patient Active Problem List   Diagnosis Date Noted  . Dementia due to Parkinson's disease with behavioral disturbance ([place]) 08/06/2017  . Pyelonephritis 08/06/2017  . Contusion of head 08/06/2017  . Pressure injury of skin 08/06/2017  . Complicated UTI (urinary tract infection) 08/05/2017  . CAP (community acquired  pneumonia) 11/29/2016  . Bacteremia 03/14/2016  . HCAP (healthcare-associated pneumonia)   . Pressure ulcer 10/01/2015  . Right lower lobe pneumonia ([place]) 10/01/2015  . Sepsis due to Gram-negative organism with acute respiratory failure ([place]) 10/01/2015  . Acute renal failure ([place]) 10/01/2015  . Acute respiratory failure with hypoxia ([place]) 09/30/2015  . Influenza A (H1N1) 03/25/2015  . Dementia without behavioral disturbance 03/25/2015  . Hyperkalemia 03/25/2015  . Falls 03/16/2015  . Sacral pressure ulcer 03/16/2015  . Fever 03/16/2015  . Memory difficulty 08/21/2014  . Malnutrition of moderate degree ([place]) 04/19/2014  . UTI (lower urinary tract infection) 04/18/2014  . Hypoxia 04/18/2014  . PNA (pneumonia) 04/18/2014  . Chronic diastolic congestive heart failure ([place]) 02/01/2013  . Pericardial effusion 02/01/2013  . Bradycardia 01/31/2013  . Empyema lung ([place]) 12/23/2012  . S/P thoracotomy 12/23/2012  . Abnormality of gait 12/05/2012  . Hypernatremia 08/13/2012  . Hypertension   . Diabetes mellitus, type 2 ([place])   . Arthritis   . Bipolar disorder ([place])   . Anxiety   . Chronic indwelling Foley catheter   . Neuromuscular disorder ([place])   . Bacteremia of undetermined etiology 06/17/2012  . Leukocytosis 04/17/2012  . Parkinson disease ([place]) 04/16/2012  . Elevated troponin 04/16/2012  . Hearing aid worn   . Diabetes mellitus ([place]) 07/06/2011  Past Surgical History:  Procedure Laterality Date  . TOTAL HIP ARTHROPLASTY    . VIDEO ASSISTED THORACOSCOPY (VATS)/EMPYEMA  12/16/2012   Procedure: VIDEO ASSISTED THORACOSCOPY (VATS)/EMPYEMA;  Surgeon: Melrose Nakayama, MD;  Location: [facility];  Service: Thoracic;  Laterality: Right;  Marland Kitchen VIDEO BRONCHOSCOPY  12/16/2012   Procedure: VIDEO BRONCHOSCOPY;  Surgeon: Melrose Nakayama, MD;  Location: [facility];  Service: Thoracic;  Laterality: Right;       Home Medications    Prior to Admission medications   Medication Sig Start  Date End Date Taking? Authorizing Provider  acetaminophen (TYLENOL) 325 MG tablet Take 650 mg by mouth every 8 (eight) hours as needed for mild pain.     [provider]  aspirin 81 MG chewable tablet Chew 81 mg by mouth daily.    [provider]  belladonna-opium (B&O SUPPRETTES) 16.2-30 MG suppository Place 1 suppository rectally every 8 (eight) hours as needed for pain. 9/51/88   Delora Fuel, MD  carbidopa-levodopa (SINEMET IR) 25-250 MG per tablet Take 1 tablet by mouth 3 (three) times daily.    [provider]  cephALEXin (KEFLEX) 500 MG capsule Take 1 capsule (500 mg total) by mouth 3 (three) times daily. 09/09/17   Law, Bea Graff, PA-C  ciprofloxacin (CIPRO) 500 MG tablet Take 1 tablet (500 mg total) by mouth 2 (two) times daily. One po bid x 7 days 08/21/17   Street, [place], PA-C  clonazePAM (KLONOPIN) 0.5 MG tablet Take 0.5 tablets (0.25 mg total) by mouth 2 (two) times daily as needed (anxiety). 08/08/17   Mariel Aloe, MD  clopidogrel (PLAVIX) 75 MG tablet Take 1 tablet (75 mg total) by mouth daily. 10/07/15   Florencia Reasons, MD  doxycycline (VIBRAMYCIN) 100 MG capsule Take 1 capsule (100 mg total) by mouth 2 (two) times daily. 08/23/17   Lajean Saver, MD  entacapone (COMTAN) 200 MG tablet Take 200 mg by mouth 3 (three) times daily.    [provider]  feeding supplement, GLUCERNA SHAKE, (GLUCERNA SHAKE) LIQD Take 237 mLs by mouth 2 (two) times daily between meals. 03/16/16   Kelvin Cellar, MD  ferrous sulfate 325 (65 FE) MG tablet Take 325 mg by mouth 2 (two) times daily.    [provider]  isosorbide mononitrate (IMDUR) 30 MG 24 hr tablet Take 1 tablet (30 mg total) by mouth daily. 10/07/15   Florencia Reasons, MD  lamoTRIgine (LAMICTAL) 150 MG tablet Take 1 tablet (150 mg total) by mouth 2 (two) times daily. 07/17/17   Arfeen, Arlyce Harman, MD  lisinopril (PRINIVIL,ZESTRIL) 10 MG tablet Take 0.5 tablets (5 mg total) by mouth daily. 10/07/15   Florencia Reasons, MD    Multiple Vitamin (MULTIVITAMIN WITH MINERALS) TABS tablet Take 1 tablet by mouth daily.    [provider]  mupirocin ointment (BACTROBAN) 2 % Place 1 application into the nose 2 (two) times daily. 08/08/17   Mariel Aloe, MD  neomycin-bacitracin-polymyxin (NEOSPORIN) [phone] ointment Apply 1 application topically at bedtime.    [provider]  NON FORMULARY [facility]  Onychomycosis Nail Lacquer -  Fluconazole 2%, Terbinafine 1% DMSO Apply to affected nail once daily Qty. 120 gm 3 refills    [provider]  nystatin (MYCOSTATIN/NYSTOP) powder Apply to affected area of skin in genital area three times daily for at least 2 weeks, or until resolution of yeast infection. 08/21/17   Street, Mercedes, PA-C  pantoprazole (PROTONIX) 40 MG tablet Take 40 mg by mouth daily. Reported  on 07/06/2016    [provider]  polyethylene glycol (MIRALAX / GLYCOLAX) packet Take 17 g by mouth daily.    [provider]  predniSONE (DELTASONE) 2.5 MG tablet Take 2.5 mg by mouth daily with breakfast.    [provider]  QUEtiapine (SEROQUEL) 25 MG tablet Take 3 tablets (75 mg total) by mouth at bedtime. 07/17/17   Arfeen, Arlyce Harman, MD  rosuvastatin (CRESTOR) 20 MG tablet Take 20 mg by mouth daily.    [provider]  sertraline (ZOLOFT) 100 MG tablet Take 2 tablets (200 mg total) by mouth daily. Patient taking differently: Take 200 mg by mouth 2 (two) times daily.  07/17/17   Arfeen, Arlyce Harman, MD  traZODone (DESYREL) 50 MG tablet Take 1 tablet (50 mg total) by mouth at bedtime. 01/17/17   Arfeen, Arlyce Harman, MD    Family History Family History  Problem Relation Age of Onset  . Depression Father   . Cancer Mother   . Heart disease Mother     Social History Social History   Tobacco Use  . Smoking status: Never Smoker  . Smokeless tobacco: Never Used  Substance Use Topics  . Alcohol use: No    Alcohol/week: 0.0 oz    Comment: occasional  .  Drug use: No     Allergies   Plant sterols and stanols   Review of Systems Review of Systems  Unable to perform ROS: Dementia     Physical Exam Updated Vital Signs BP (!) 165/86 (BP Location: Left Arm)   Pulse 63   Temp 97.9 F (36.6 C) (Oral)   Resp 15   Ht 5\' 10"  (1.778 m)   Wt 72.6 kg (160 lb)   SpO2 100%   BMI 22.96 kg/m   Physical Exam  Constitutional: He appears well-developed and well-nourished.  HENT:  Head: Normocephalic.  Eyes: Pupils are equal, round, and reactive to light.  Cardiovascular: Normal rate.  Pulmonary/Chest: Effort normal.  Abdominal: There is no tenderness.  Genitourinary:  Genitourinary Comments: Foley catheter with leg bag in place  Musculoskeletal: He exhibits no tenderness.  Neurological: He is alert.  Awake and pleasant but with mild confusion.  Oriented x2.  Mildly confused to year but able to answer more the questions.  Moving all extremities.  Skin: Skin is warm. Capillary refill takes less than 2 seconds.     ED Treatments / Results  Labs (all labs ordered are listed, but only abnormal results are displayed) Labs Reviewed  URINE CULTURE  URINALYSIS, ROUTINE W REFLEX MICROSCOPIC  CBG MONITORING, ED    EKG  EKG Interpretation None       Radiology No results found.  Procedures Procedures (including critical care time)  Medications Ordered in ED Medications - No data to display   Initial Impression / Assessment and Plan / ED Course  I have reviewed the triage vital signs and the nursing notes.  Pertinent labs & imaging results that were available during my care of the patient were reviewed by me and considered in my medical decision making (see chart for details).     Patient with chronic Foley catheter.  Has dementia and patient may have come in for several different complaints.  Had mild confusion reportedly for EMS.  Now appears to be at his baseline.  Urinalysis sent and does not clearly have an infection.   Culture has been sent.  However with questionable mental status changes as the only symptom I have not empirically treated.  Culture will need to be reviewed by primary care doctor and potentially treated if fevers or mental status changes continue.  Discharge back to nursing home.  Final Clinical Impressions(s) / ED Diagnoses   Final diagnoses:  None    ED Discharge Orders    None       Davonna Belling, MD 12/11/17 2339

## 2017-12-11 NOTE — ED Notes (Signed)
Just reviewed pt hx in pt chart, pt does have hx of dementia r/t parkinson's and frequent UTI's.

## 2017-12-11 NOTE — Discharge Instructions (Signed)
Urine culture has been sent.  Have your doctor follow-up with the results to see if he would potentially need antibiotics you may need antibiotics if you develope fevers chills or change in mental status and your culture is positive.

## 2017-12-17 LAB — URINE CULTURE

## 2017-12-18 ENCOUNTER — Telehealth: Payer: Self-pay | Admitting: Emergency Medicine

## 2017-12-18 NOTE — Progress Notes (Signed)
ED Antimicrobial Stewardship Positive Culture Follow Up   [name]. is an 83 y.o. male who presented to [facility] on 12/11/2017  Chief Complaint  Patient presents with  . Indwelling Urinary Catheter Pain    Recent Results (from the past 720 hour(s))  Urine culture     Status: Abnormal   Collection Time: 12/11/17  5:11 PM  Result Value Ref Range Status   Specimen Description URINE, CATHETERIZED  Final   Special Requests NONE  Final   Culture (A)  Final    >=100,000 COLONIES/mL PSEUDOMONAS AERUGINOSA >=100,000 COLONIES/mL STAPHYLOCOCCUS AUREUS    Report Status 12/17/2017 FINAL  Final   Organism ID, Bacteria PSEUDOMONAS AERUGINOSA (A)  Final   Organism ID, Bacteria STAPHYLOCOCCUS AUREUS (A)  Final      Susceptibility   Pseudomonas aeruginosa - MIC*    CEFTAZIDIME 4 SENSITIVE Sensitive     CIPROFLOXACIN >=4 RESISTANT Resistant     GENTAMICIN <=1 SENSITIVE Sensitive     IMIPENEM 8 INTERMEDIATE Intermediate     PIP/TAZO 8 SENSITIVE Sensitive     CEFEPIME 2 SENSITIVE Sensitive     * >=100,000 COLONIES/mL PSEUDOMONAS AERUGINOSA   Staphylococcus aureus - MIC*    CIPROFLOXACIN >=8 RESISTANT Resistant     GENTAMICIN <=0.5 SENSITIVE Sensitive     NITROFURANTOIN <=16 SENSITIVE Sensitive     OXACILLIN <=0.25 SENSITIVE Sensitive     TETRACYCLINE <=1 SENSITIVE Sensitive     VANCOMYCIN <=0.5 SENSITIVE Sensitive     TRIMETH/SULFA <=10 SENSITIVE Sensitive     CLINDAMYCIN RESISTANT Resistant     RIFAMPIN <=0.5 SENSITIVE Sensitive     Inducible Clindamycin POSITIVE Resistant     * >=100,000 COLONIES/mL STAPHYLOCOCCUS AUREUS    []  Treated with , organism resistant to prescribed antimicrobial [x]  Patient discharged originally without antimicrobial agent and treatment is now indicated  New antibiotic prescription: Keflex 500 mg po bid x 7 days  ED Provider: Lucrezia Starch, PA-C   [name], [name] 12/18/2017, 11:07 AM

## 2017-12-18 NOTE — Telephone Encounter (Signed)
Post ED Visit - Positive Culture Follow-up: Successful Patient Follow-Up  Culture assessed and recommendations reviewed by: []  Elenor Quinones, Pharm.D. []  Heide Guile, Pharm.D., BCPS AQ-ID []  Parks Neptune, Pharm.D., BCPS []  Alycia Rossetti, Pharm.D., BCPS []  [place], Pharm.D., BCPS, AAHIVP []  Legrand Como, Pharm.D., BCPS, AAHIVP []  Salome Arnt, PharmD, BCPS []  Dimitri Ped, PharmD, BCPS []  Vincenza Hews, PharmD, BCPS  Positive urine culture  [x]  Patient discharged without antimicrobial prescription and treatment is now indicated []  Organism is resistant to prescribed ED discharge antimicrobial []  Patient with positive blood cultures  Changes discussed with ED provider: Ardyth Harps PA New antibiotic prescription start Keflex 500mg  po bid x 7 days  Faxed new order to [facility] @ [phone]   Hazle Nordmann 12/18/2017, 2:50 PM

## 2018-01-07 ENCOUNTER — Ambulatory Visit (INDEPENDENT_AMBULATORY_CARE_PROVIDER_SITE_OTHER): Payer: Medicare Other | Admitting: Psychiatry

## 2018-01-07 ENCOUNTER — Encounter (HOSPITAL_COMMUNITY): Payer: Self-pay | Admitting: Psychiatry

## 2018-01-07 VITALS — BP 113/67 | HR 54 | Resp 12 | Ht 70.0 in | Wt 160.2 lb

## 2018-01-07 DIAGNOSIS — Z818 Family history of other mental and behavioral disorders: Secondary | ICD-10-CM | POA: Diagnosis not present

## 2018-01-07 DIAGNOSIS — R413 Other amnesia: Secondary | ICD-10-CM | POA: Diagnosis not present

## 2018-01-07 DIAGNOSIS — N390 Urinary tract infection, site not specified: Secondary | ICD-10-CM | POA: Diagnosis not present

## 2018-01-07 DIAGNOSIS — M2550 Pain in unspecified joint: Secondary | ICD-10-CM

## 2018-01-07 DIAGNOSIS — G3184 Mild cognitive impairment, so stated: Secondary | ICD-10-CM

## 2018-01-07 DIAGNOSIS — F319 Bipolar disorder, unspecified: Secondary | ICD-10-CM | POA: Diagnosis not present

## 2018-01-07 DIAGNOSIS — H9190 Unspecified hearing loss, unspecified ear: Secondary | ICD-10-CM | POA: Diagnosis not present

## 2018-01-07 DIAGNOSIS — M549 Dorsalgia, unspecified: Secondary | ICD-10-CM | POA: Diagnosis not present

## 2018-01-07 DIAGNOSIS — F419 Anxiety disorder, unspecified: Secondary | ICD-10-CM | POA: Diagnosis not present

## 2018-01-07 NOTE — Progress Notes (Addendum)
BH MD/PA/NP OP Progress Note  01/07/2018 10:47 AM London Baxter Kail.  MRN:  [id number]  Chief Complaint: I moved to new location.  HPI: Patient came for his follow-up appointment with his aide.  He was last seen in July 2018.  Since then patient has multiple ER visits due to UTI.  He is now living in [place] facility.  He was not happy with [facility].  His aide told that he has multiple UTI, falls and he was not getting good care at [facility].  He is seeing visiting physician at the facility and recently his psych medications were changed.  He is no longer taking Seroquel.  He is taking Risperdal 0.25 mg 3 times a day and Zoloft dose increased to 300.  Patient denies any crying spells, anger or mania but he continues to have rage at times when he believe his property has been stolen and is not happy with food at the facility.  However he realized he need to give more time to the staff.  Aide also reported that sometimes he gets frustrated and irritable but denies any aggression or any suicidal thoughts.  I have noticed gradually decline in his memory.  He is using wheelchair for ambulation.  He has repeated fall and UTI.  He is seeing neurologist for Parkinson and gait issue.  His appetite is fair.  Has any rash or any itching.  He is sleeping okay.  Denies any hallucination or any suicidal thoughts or homicidal thoughts.  Recently his psych medication is prescribed by Cherrie Distance and nurse practitioner who works under Dr. Lavina Hamman.  Patient is taking Lamictal, Zoloft, Risperdal and Klonopin.  Visit Diagnosis:    ICD-10-CM   1. Bipolar 1 disorder ([place]) F31.9     Past Psychiatric History: Reviewed. Patient has history of bipolar disorder. He has multiple psychiatric admission due to decompensation and noncompliance of medication. Patient has history of aggression and impulsive behavior. His last psychiatric admission was in 2005.  Past Medical History:  Past Medical History:   Diagnosis Date  . Anxiety   . Arthritis   . Benign prostate hyperplasia   . Bipolar disorder ([place])   . C2 cervical fracture ([name])    due to pt fall  . Chronic indwelling Foley catheter   . Depression   . Diabetes mellitus type II   . Falls   . Flu   . Hearing aid worn   . Hypertension   . Memory difficulty 08/21/2014  . Memory loss   . MRSA infection (methicillin-resistant Staphylococcus aureus)   . Neuromuscular disorder ([place])    parkinsons  . Parkinson disease ([place])   . Pneumonia   . SIRS (systemic inflammatory response syndrome) ([name])   . UTI (urinary tract infection)     Past Surgical History:  Procedure Laterality Date  . TOTAL HIP ARTHROPLASTY    . VIDEO ASSISTED THORACOSCOPY (VATS)/EMPYEMA  12/16/2012   Procedure: VIDEO ASSISTED THORACOSCOPY (VATS)/EMPYEMA;  Surgeon: Melrose Nakayama, MD;  Location: [place];  Service: Thoracic;  Laterality: Right;  Marland Kitchen VIDEO BRONCHOSCOPY  12/16/2012   Procedure: VIDEO BRONCHOSCOPY;  Surgeon: Melrose Nakayama, MD;  Location: [place];  Service: Thoracic;  Laterality: Right;    Family Psychiatric History: Reviewed.  Family History:  Family History  Problem Relation Age of Onset  . Depression Father   . Cancer Mother   . Heart disease Mother     Social History:  Social History   Socioeconomic History  .  Marital status: Divorced    Spouse name: None  . Number of children: 2  . Years of education: college 1  . Highest education level: None  Social Needs  . Financial resource strain: None  . Food insecurity - worry: None  . Food insecurity - inability: None  . Transportation needs - medical: None  . Transportation needs - non-medical: None  Occupational History  . Occupation: Retired  Tobacco Use  . Smoking status: Never Smoker  . Smokeless tobacco: Never Used  Substance and Sexual Activity  . Alcohol use: No    Alcohol/week: 0.0 oz    Comment: occasional  . Drug use: No  . Sexual activity: Not Currently  Other  Topics Concern  . None  Social History Narrative   Patient is right handed.   Patient drinks 1 cup of caffeine daily.    Allergies:  Allergies  Allergen Reactions  . Plant Sterols And Stanols Other (See Comments)    ON MAR    Metabolic Disorder Labs: Recent Results (from the past 2160 hour(s))  Urinalysis, Routine w reflex microscopic     Status: Abnormal   Collection Time: 10/21/17  8:42 PM  Result Value Ref Range   Color, Urine RED (A) YELLOW   APPearance CLOUDY (A) CLEAR   Specific Gravity, Urine 1.011 1.005 - 1.030   pH 8.0 5.0 - 8.0   Glucose, UA NEGATIVE NEGATIVE mg/dL   Hgb urine dipstick LARGE (A) NEGATIVE   Bilirubin Urine NEGATIVE NEGATIVE   Ketones, ur NEGATIVE NEGATIVE mg/dL   Protein, ur 100 (A) NEGATIVE mg/dL   Nitrite POSITIVE (A) NEGATIVE   Leukocytes, UA MODERATE (A) NEGATIVE   RBC / HPF TOO NUMEROUS TO COUNT 0 - 5 RBC/hpf   WBC, UA TOO NUMEROUS TO COUNT 0 - 5 WBC/hpf   Bacteria, UA NONE SEEN NONE SEEN   Squamous Epithelial / LPF NONE SEEN NONE SEEN   WBC Clumps PRESENT    Budding Yeast PRESENT   CBC with Differential/Platelet     Status: Abnormal   Collection Time: 10/21/17  8:42 PM  Result Value Ref Range   WBC 8.4 4.0 - 10.5 K/uL   RBC 4.13 (L) 4.22 - 5.81 MIL/uL   Hemoglobin 11.8 (L) 13.0 - 17.0 g/dL   HCT 36.2 (L) 39.0 - 52.0 %   MCV 87.7 78.0 - 100.0 fL   MCH 28.6 26.0 - 34.0 pg   MCHC 32.6 30.0 - 36.0 g/dL   RDW 15.1 11.5 - 15.5 %   Platelets 220 150 - 400 K/uL   Neutrophils Relative % 67 %   Neutro Abs 5.7 1.7 - 7.7 K/uL   Lymphocytes Relative 16 %   Lymphs Abs 1.3 0.7 - 4.0 K/uL   Monocytes Relative 8 %   Monocytes Absolute 0.7 0.1 - 1.0 K/uL   Eosinophils Relative 9 %   Eosinophils Absolute 0.7 0.0 - 0.7 K/uL   Basophils Relative 0 %   Basophils Absolute 0.0 0.0 - 0.1 K/uL  Basic metabolic panel     Status: Abnormal   Collection Time: 10/21/17  8:42 PM  Result Value Ref Range   Sodium 138 135 - 145 mmol/L   Potassium 4.4 3.5 -  5.1 mmol/L   Chloride 103 101 - 111 mmol/L   CO2 28 22 - 32 mmol/L   Glucose, Bld 82 65 - 99 mg/dL   BUN 26 (H) 6 - 20 mg/dL   Creatinine, Ser 1.04 0.61 - 1.24 mg/dL   Calcium  8.7 (L) 8.9 - 10.3 mg/dL   GFR calc non Af Amer >60 >60 mL/min   GFR calc Af Amer >60 >60 mL/min    Comment: (NOTE) The eGFR has been calculated using the CKD EPI equation. This calculation has not been validated in all clinical situations. eGFR's persistently <60 mL/min signify possible Chronic Kidney Disease.    Anion gap 7 5 - 15  Urine culture     Status: Abnormal   Collection Time: 10/21/17  8:42 PM  Result Value Ref Range   Specimen Description URINE, CLEAN CATCH    Special Requests NONE    Culture (A)     >=100,000 COLONIES/mL ENTEROCOCCUS FAECALIS >=100,000 COLONIES/mL PSEUDOMONAS AERUGINOSA    Report Status 10/25/2017 FINAL    Organism ID, Bacteria ENTEROCOCCUS FAECALIS (A)    Organism ID, Bacteria PSEUDOMONAS AERUGINOSA (A)       Susceptibility   Enterococcus faecalis - MIC*    AMPICILLIN <=2 SENSITIVE Sensitive     LEVOFLOXACIN >=8 RESISTANT Resistant     NITROFURANTOIN <=16 SENSITIVE Sensitive     VANCOMYCIN 1 SENSITIVE Sensitive     * >=100,000 COLONIES/mL ENTEROCOCCUS FAECALIS   Pseudomonas aeruginosa - MIC*    CEFTAZIDIME 2 SENSITIVE Sensitive     CIPROFLOXACIN >=4 RESISTANT Resistant     GENTAMICIN <=1 SENSITIVE Sensitive     IMIPENEM 1 SENSITIVE Sensitive     PIP/TAZO <=4 SENSITIVE Sensitive     CEFEPIME <=1 SENSITIVE Sensitive     * >=100,000 COLONIES/mL PSEUDOMONAS AERUGINOSA  POC CBG, ED     Status: None   Collection Time: 12/11/17  3:35 PM  Result Value Ref Range   Glucose-Capillary 90 65 - 99 mg/dL   Comment 1 Notify RN    Comment 2 Document in Chart   Urinalysis, Routine w reflex microscopic     Status: Abnormal   Collection Time: 12/11/17  5:11 PM  Result Value Ref Range   Color, Urine AMBER (A) YELLOW    Comment: BIOCHEMICALS MAY BE AFFECTED BY COLOR   APPearance  HAZY (A) CLEAR   Specific Gravity, Urine 1.014 1.005 - 1.030   pH 6.0 5.0 - 8.0   Glucose, UA NEGATIVE NEGATIVE mg/dL   Hgb urine dipstick NEGATIVE NEGATIVE   Bilirubin Urine NEGATIVE NEGATIVE   Ketones, ur 5 (A) NEGATIVE mg/dL   Protein, ur NEGATIVE NEGATIVE mg/dL   Nitrite NEGATIVE NEGATIVE   Leukocytes, UA MODERATE (A) NEGATIVE   RBC / HPF 0-5 0 - 5 RBC/hpf   WBC, UA 6-30 0 - 5 WBC/hpf   Bacteria, UA MANY (A) NONE SEEN   Squamous Epithelial / LPF NONE SEEN NONE SEEN  Urine culture     Status: Abnormal   Collection Time: 12/11/17  5:11 PM  Result Value Ref Range   Specimen Description URINE, CATHETERIZED    Special Requests NONE    Culture (A)     >=100,000 COLONIES/mL PSEUDOMONAS AERUGINOSA >=100,000 COLONIES/mL STAPHYLOCOCCUS AUREUS    Report Status 12/17/2017 FINAL    Organism ID, Bacteria PSEUDOMONAS AERUGINOSA (A)    Organism ID, Bacteria STAPHYLOCOCCUS AUREUS (A)       Susceptibility   Pseudomonas aeruginosa - MIC*    CEFTAZIDIME 4 SENSITIVE Sensitive     CIPROFLOXACIN >=4 RESISTANT Resistant     GENTAMICIN <=1 SENSITIVE Sensitive     IMIPENEM 8 INTERMEDIATE Intermediate     PIP/TAZO 8 SENSITIVE Sensitive     CEFEPIME 2 SENSITIVE Sensitive     * >=100,000 COLONIES/mL PSEUDOMONAS AERUGINOSA  Staphylococcus aureus - MIC*    CIPROFLOXACIN >=8 RESISTANT Resistant     GENTAMICIN <=0.5 SENSITIVE Sensitive     NITROFURANTOIN <=16 SENSITIVE Sensitive     OXACILLIN <=0.25 SENSITIVE Sensitive     TETRACYCLINE <=1 SENSITIVE Sensitive     VANCOMYCIN <=0.5 SENSITIVE Sensitive     TRIMETH/SULFA <=10 SENSITIVE Sensitive     CLINDAMYCIN RESISTANT Resistant     RIFAMPIN <=0.5 SENSITIVE Sensitive     Inducible Clindamycin POSITIVE Resistant     * >=100,000 COLONIES/mL STAPHYLOCOCCUS AUREUS   Lab Results  Component Value Date   HGBA1C 5.6 09/30/2015   MPG 114 09/30/2015   MPG 111 03/16/2015   No results found for: PROLACTIN No results found for: CHOL, TRIG, HDL, CHOLHDL,  VLDL, LDLCALC Lab Results  Component Value Date   TSH 1.745 10/07/2015   TSH 2.569 01/31/2013    Therapeutic Level Labs: Lab Results  Component Value Date   LITHIUM 0.50 (L) 09/07/2012   LITHIUM 1.14 08/14/2012   No results found for: VALPROATE No components found for:  CBMZ  Current Medications: Current Outpatient Medications  Medication Sig Dispense Refill  . acetaminophen (TYLENOL) 325 MG tablet Take 650 mg by mouth every 8 (eight) hours as needed for mild pain.     Marland Kitchen aspirin 81 MG chewable tablet Chew 81 mg by mouth daily.    . belladonna-opium (B&O SUPPRETTES) 16.2-30 MG suppository Place 1 suppository rectally every 8 (eight) hours as needed for pain. 15 suppository 0  . carbidopa-levodopa (SINEMET IR) 25-250 MG per tablet Take 1 tablet by mouth 3 (three) times daily.    . cephALEXin (KEFLEX) 500 MG capsule Take 1 capsule (500 mg total) by mouth 3 (three) times daily. (Patient not taking: Reported on 12/11/2017) 21 capsule 0  . ciprofloxacin (CIPRO) 500 MG tablet Take 1 tablet (500 mg total) by mouth 2 (two) times daily. One po bid x 7 days 14 tablet 0  . clonazePAM (KLONOPIN) 0.5 MG tablet Take 0.5 tablets (0.25 mg total) by mouth 2 (two) times daily as needed (anxiety). 5 tablet 0  . clopidogrel (PLAVIX) 75 MG tablet Take 1 tablet (75 mg total) by mouth daily. 30 tablet 0  . doxycycline (VIBRAMYCIN) 100 MG capsule Take 1 capsule (100 mg total) by mouth 2 (two) times daily. 14 capsule 0  . entacapone (COMTAN) 200 MG tablet Take 200 mg by mouth 3 (three) times daily.    . feeding supplement, GLUCERNA SHAKE, (GLUCERNA SHAKE) LIQD Take 237 mLs by mouth 2 (two) times daily between meals. 30 Can 0  . ferrous sulfate 325 (65 FE) MG tablet Take 325 mg by mouth 2 (two) times daily.    . isosorbide mononitrate (IMDUR) 30 MG 24 hr tablet Take 1 tablet (30 mg total) by mouth daily. 30 tablet 0  . lamoTRIgine (LAMICTAL) 150 MG tablet Take 1 tablet (150 mg total) by mouth 2 (two) times  daily. 60 tablet 2  . lisinopril (PRINIVIL,ZESTRIL) 10 MG tablet Take 0.5 tablets (5 mg total) by mouth daily. 30 tablet 0  . Multiple Vitamin (MULTIVITAMIN WITH MINERALS) TABS tablet Take 1 tablet by mouth daily.    . mupirocin ointment (BACTROBAN) 2 % Place 1 application into the nose 2 (two) times daily. 22 g 0  . neomycin-bacitracin-polymyxin (NEOSPORIN) [phone] ointment Apply 1 application topically at bedtime.    Marland Kitchen nystatin (MYCOSTATIN/NYSTOP) powder Apply to affected area of skin in genital area three times daily for at least 2 weeks, or until resolution  of yeast infection. 60 g 2  . pantoprazole (PROTONIX) 40 MG tablet Take 40 mg by mouth daily. Reported on 07/06/2016    . polyethylene glycol (MIRALAX / GLYCOLAX) packet Take 17 g by mouth daily.    . predniSONE (DELTASONE) 2.5 MG tablet Take 2.5 mg by mouth daily with breakfast.    . QUEtiapine (SEROQUEL) 25 MG tablet Take 3 tablets (75 mg total) by mouth at bedtime. (Patient not taking: Reported on 12/11/2017) 90 tablet 2  . risperiDONE (RISPERDAL) 0.25 MG tablet Take 0.25 mg by mouth 3 (three) times daily.    . rosuvastatin (CRESTOR) 20 MG tablet Take 20 mg by mouth daily.    . sertraline (ZOLOFT) 100 MG tablet Take 2 tablets (200 mg total) by mouth daily. (Patient taking differently: Take 300 mg by mouth daily. ) 60 tablet 2  . traZODone (DESYREL) 50 MG tablet Take 1 tablet (50 mg total) by mouth at bedtime. 30 tablet 2   No current facility-administered medications for this visit.      Musculoskeletal: Strength & Muscle Tone: decreased Gait & Station: unsteady Patient leans: Right, Left, Front and Backward  Psychiatric Specialty Exam: Review of Systems  Constitutional: Positive for malaise/fatigue.  HENT: Positive for hearing loss.   Musculoskeletal: Positive for back pain and joint pain.  Skin: Negative.  Negative for itching and rash.  Neurological: Positive for tingling.  Psychiatric/Behavioral: Positive for memory  loss.    Blood pressure 113/67, pulse (!) 54, resp. rate 12, height _0  (1.778 m), weight 160 lb 3.2 oz (72.7 kg).Body mass index is 22.99 kg/m.  General Appearance: Fairly Groomed  Eye Contact:  Fair  Speech:  Slow  Volume:  Decreased  Mood:  Euthymic  Affect:  Restricted  Thought Process:  Descriptions of Associations: Circumstantial  Orientation:  Other:  Oriented to person and place  Thought Content: Poverty of thought content.  No delusion, no paranoia   Suicidal Thoughts:  No  Homicidal Thoughts:  No  Memory:  Immediate;   Poor Recent;   Poor Remote;   Poor  Judgement:  Fair  Insight:  Fair  Psychomotor Activity:  Decreased  Concentration:  Concentration: Fair and Attention Span: Fair  Recall:  [employer] of Knowledge: Fair  Language: Fair  Akathisia:  No  Handed:  Right  AIMS (if indicated): not done  Assets:  Desire for Improvement Housing  ADL's:  Impaired  Cognition: Impaired,  Moderate  Sleep:  Good   Screenings: Mini-Mental     Office Visit from 10/17/2017 in [facility] Office Visit from 03/22/2017 in [facility] Office Visit from 12/07/2016 in [facility] Office Visit from 07/06/2016 in [facility] Office Visit from 12/07/2015 in [facility]  Total Score (max 30 points )  _1 Assessment and Plan: Bipolar disorder type I.  Cognitive disorder NOS.  Anxiety disorder NOS  I reviewed records from other providers and recent blood work results.  He is seeing a nurse practitioner Cherrie Distance who is prescribing psychiatric medication.  He is no longer taking Seroquel.  I called facility and I was told Seroquel was discontinued because patient having repeated UTI.  He is now taking Risperdal 0.25 mg 3 times a day and is tolerating better than Seroquel.  His Zoloft also increased to 300 mg a day.  He will continue trazodone 50 mg at bedtime, Lamictal 150  mg  twice a day and Klonopin 0.5 mg twice a day.  I have noticed gradual decline in his memory.  Patient has no rash, itching from the Lamictal.  I believe patient should see nurse practitioner who is managing her psychiatric medication.  Also there is a history of compliance as lately patient is unable to come for the appointment due to his physical condition.  I explained that he should continue his psychiatric care at [facility].  Patient agreed with the plan.  I also called the nurse practitioner's office at [phone] and left a message that patient will see one provider under her care.  At this time he has no concerns from the medication.  However I recommended if symptoms started to get worse then he should call us for future appointment.  Time spent 25 minutes.  More than 50% of the time spent in psychoeducation, counseling, coordination of care and reviewing records from other providers.   Kathlee Nations, MD 01/07/2018, 10:47 AM

## 2018-01-14 ENCOUNTER — Ambulatory Visit (INDEPENDENT_AMBULATORY_CARE_PROVIDER_SITE_OTHER): Payer: Medicare Other | Admitting: Orthopaedic Surgery

## 2018-01-16 ENCOUNTER — Encounter (HOSPITAL_COMMUNITY): Payer: Self-pay

## 2018-01-16 ENCOUNTER — Other Ambulatory Visit: Payer: Self-pay

## 2018-01-16 ENCOUNTER — Emergency Department (HOSPITAL_COMMUNITY): Payer: Medicare Other

## 2018-01-16 ENCOUNTER — Emergency Department (HOSPITAL_COMMUNITY)
Admission: EM | Admit: 2018-01-16 | Discharge: 2018-01-16 | Payer: Medicare Other | Attending: Emergency Medicine | Admitting: Emergency Medicine

## 2018-01-16 DIAGNOSIS — E119 Type 2 diabetes mellitus without complications: Secondary | ICD-10-CM | POA: Diagnosis not present

## 2018-01-16 DIAGNOSIS — B9789 Other viral agents as the cause of diseases classified elsewhere: Secondary | ICD-10-CM | POA: Diagnosis not present

## 2018-01-16 DIAGNOSIS — Z7982 Long term (current) use of aspirin: Secondary | ICD-10-CM | POA: Insufficient documentation

## 2018-01-16 DIAGNOSIS — G20 Parkinson's disease: Secondary | ICD-10-CM | POA: Diagnosis not present

## 2018-01-16 DIAGNOSIS — J069 Acute upper respiratory infection, unspecified: Secondary | ICD-10-CM | POA: Insufficient documentation

## 2018-01-16 DIAGNOSIS — Z8614 Personal history of Methicillin resistant Staphylococcus aureus infection: Secondary | ICD-10-CM | POA: Diagnosis not present

## 2018-01-16 DIAGNOSIS — R531 Weakness: Secondary | ICD-10-CM | POA: Diagnosis present

## 2018-01-16 DIAGNOSIS — R509 Fever, unspecified: Secondary | ICD-10-CM | POA: Diagnosis not present

## 2018-01-16 DIAGNOSIS — I10 Essential (primary) hypertension: Secondary | ICD-10-CM | POA: Insufficient documentation

## 2018-01-16 DIAGNOSIS — Z79899 Other long term (current) drug therapy: Secondary | ICD-10-CM | POA: Diagnosis not present

## 2018-01-16 LAB — CBC WITH DIFFERENTIAL/PLATELET
Basophils Absolute: 0 10*3/uL (ref 0.0–0.1)
Basophils Relative: 0 %
Eosinophils Absolute: 0.1 10*3/uL (ref 0.0–0.7)
Eosinophils Relative: 1 %
HCT: 39.1 % (ref 39.0–52.0)
Hemoglobin: 12.9 g/dL — ABNORMAL LOW (ref 13.0–17.0)
Lymphocytes Relative: 5 %
Lymphs Abs: 0.6 10*3/uL — ABNORMAL LOW (ref 0.7–4.0)
MCH: 29 pg (ref 26.0–34.0)
MCHC: 33 g/dL (ref 30.0–36.0)
MCV: 87.9 fL (ref 78.0–100.0)
Monocytes Absolute: 0.8 10*3/uL (ref 0.1–1.0)
Monocytes Relative: 6 %
Neutro Abs: 10.9 10*3/uL — ABNORMAL HIGH (ref 1.7–7.7)
Neutrophils Relative %: 88 %
Platelets: 153 10*3/uL (ref 150–400)
RBC: 4.45 MIL/uL (ref 4.22–5.81)
RDW: 16.3 % — ABNORMAL HIGH (ref 11.5–15.5)
WBC: 12.3 10*3/uL — ABNORMAL HIGH (ref 4.0–10.5)

## 2018-01-16 LAB — URINALYSIS, ROUTINE W REFLEX MICROSCOPIC
Bacteria, UA: NONE SEEN
Bilirubin Urine: NEGATIVE
Glucose, UA: NEGATIVE mg/dL
Ketones, ur: 5 mg/dL — AB
Nitrite: POSITIVE — AB
Protein, ur: 100 mg/dL — AB
Specific Gravity, Urine: 1.018 (ref 1.005–1.030)
Squamous Epithelial / LPF: NONE SEEN
pH: 5 (ref 5.0–8.0)

## 2018-01-16 LAB — BASIC METABOLIC PANEL
Anion gap: 7 (ref 5–15)
BUN: 24 mg/dL — ABNORMAL HIGH (ref 6–20)
CO2: 25 mmol/L (ref 22–32)
Calcium: 8.5 mg/dL — ABNORMAL LOW (ref 8.9–10.3)
Chloride: 105 mmol/L (ref 101–111)
Creatinine, Ser: 0.96 mg/dL (ref 0.61–1.24)
GFR calc Af Amer: >60 mL/min (ref >60)
GFR calc non Af Amer: >60 mL/min (ref >60)
Glucose, Bld: 136 mg/dL — ABNORMAL HIGH (ref 65–99)
Potassium: 4.3 mmol/L (ref 3.5–5.1)
Sodium: 137 mmol/L (ref 135–145)

## 2018-01-16 LAB — INFLUENZA PANEL BY PCR (TYPE A & B)
INFLAPCR: NEGATIVE
INFLBPCR: NEGATIVE

## 2018-01-16 LAB — I-STAT CG4 LACTIC ACID, ED: Lactic Acid, Venous: 1.02 mmol/L (ref 0.5–1.9)

## 2018-01-16 MED ORDER — ACETAMINOPHEN 325 MG PO TABS
650.0000 mg | ORAL_TABLET | Freq: Once | ORAL | Status: AC
  Administered 2018-01-16: 650 mg via ORAL
  Filled 2018-01-16: qty 2

## 2018-01-16 MED ORDER — ALBUTEROL SULFATE HFA 108 (90 BASE) MCG/ACT IN AERS: 1.0000 | INHALATION_SPRAY | Freq: Four times a day (QID) | RESPIRATORY_TRACT | 0 refills | Status: AC | PRN

## 2018-01-16 MED ORDER — OSELTAMIVIR PHOSPHATE 75 MG PO CAPS: 75.0000 mg | ORAL_CAPSULE | Freq: Two times a day (BID) | ORAL | 0 refills | Status: DC

## 2018-01-16 MED ORDER — ALBUTEROL SULFATE HFA 108 (90 BASE) MCG/ACT IN AERS
2.0000 | INHALATION_SPRAY | Freq: Once | RESPIRATORY_TRACT | Status: AC
Start: 2018-01-16 — End: 2018-01-16
  Administered 2018-01-16: 2 via RESPIRATORY_TRACT
  Filled 2018-01-16: qty 6.7

## 2018-01-16 MED ORDER — DEXTROSE 5 % IV SOLN
1.0000 g | Freq: Once | INTRAVENOUS | Status: AC
  Administered 2018-01-16: 1 g via INTRAVENOUS
  Filled 2018-01-16: qty 10

## 2018-01-16 MED ORDER — SODIUM CHLORIDE 0.9 % IV BOLUS (SEPSIS)
500.0000 mL | Freq: Once | INTRAVENOUS | Status: AC
  Administered 2018-01-16: 500 mL via INTRAVENOUS

## 2018-01-16 NOTE — ED Notes (Signed)
Patient transported to X-ray 

## 2018-01-16 NOTE — ED Provider Notes (Signed)
[facility] DEPT Provider Note   CSN: [id number] Arrival date & time: 01/16/18  1356     History   Chief Complaint Chief Complaint  Patient presents with  . Weakness    HPI [name]. is a 83 y.o. male.  HPI Patient presents to the emergency department from a nursing home with weakness per the caregivers.  They were concerned he either has pneumonia or a urinary tract infection.  Patient is unable to give any history due to his dementia.  Patient tells me to leave the room otherwise he does not give me any history. Past Medical History:  Diagnosis Date  . Anxiety   . Arthritis   . Benign prostate hyperplasia   . Bipolar disorder ([place])   . C2 cervical fracture ([name])    due to pt fall  . Chronic indwelling Foley catheter   . Depression   . Diabetes mellitus type II   . Falls   . Flu   . Hearing aid worn   . Hypertension   . Memory difficulty 08/21/2014  . Memory loss   . MRSA infection (methicillin-resistant Staphylococcus aureus)   . Neuromuscular disorder ([place])    parkinsons  . Parkinson disease ([place])   . Pneumonia   . SIRS (systemic inflammatory response syndrome) ([name])   . UTI (urinary tract infection)     Patient Active Problem List   Diagnosis Date Noted  . 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 Pyelonephritis 08/06/2017  . Contusion of head 08/06/2017  . Pressure injury of skin 08/06/2017  . Complicated UTI (urinary tract infection) 08/05/2017  . CAP (community acquired pneumonia) 11/29/2016  . Bacteremia 03/14/2016  . HCAP (healthcare-associated pneumonia)   . Pressure ulcer 10/01/2015  . Right lower lobe pneumonia ([place]) 10/01/2015  . Sepsis due to Gram-negative organism with acute respiratory failure ([place]) 10/01/2015  . Acute renal failure ([place]) 10/01/2015  . Acute respiratory failure with hypoxia ([place]) 09/30/2015  . Influenza A (H1N1) 03/25/2015  . Dementia without  behavioral disturbance 03/25/2015  . Hyperkalemia 03/25/2015  . Falls 03/16/2015  . Sacral pressure ulcer 03/16/2015  . Fever 03/16/2015  . Memory difficulty 08/21/2014  . Malnutrition of moderate degree ([place]) 04/19/2014  . UTI (lower urinary tract infection) 04/18/2014  . Hypoxia 04/18/2014  . PNA (pneumonia) 04/18/2014  . Chronic diastolic congestive heart failure ([place]) 02/01/2013  . Pericardial effusion 02/01/2013  . Bradycardia 01/31/2013  . Empyema lung ([place]) 12/23/2012  . S/P thoracotomy 12/23/2012  . Abnormality of gait 12/05/2012  . Hypernatremia 08/13/2012  . Hypertension   . Diabetes mellitus, type 2 ([place])   . Arthritis   . Bipolar disorder ([place])   . Anxiety   . Chronic indwelling Foley catheter   . Neuromuscular disorder ([place])   . Bacteremia of undetermined etiology 06/17/2012  . Leukocytosis 04/17/2012  . Parkinson disease ( [place]) 04/16/2012  . Elevated troponin 04/16/2012  . Hearing aid worn   . Diabetes mellitus ([place]) 07/06/2011    Past Surgical History:  Procedure Laterality Date  . TOTAL HIP ARTHROPLASTY    . VIDEO ASSISTED THORACOSCOPY (VATS)/EMPYEMA  12/16/2012   Procedure: VIDEO ASSISTED THORACOSCOPY (VATS)/EMPYEMA;  Surgeon: Melrose Nakayama, MD;  Location: [facility];  Service: Thoracic;  Laterality: Right;  Marland Kitchen VIDEO BRONCHOSCOPY  12/16/2012   Procedure: VIDEO BRONCHOSCOPY;  Surgeon: Melrose Nakayama, MD;  Location: [facility];  Service: Thoracic;  Laterality: Right;       Home Medications  Prior to Admission medications   Medication Sig Start Date End Date Taking? Authorizing Provider  aspirin 81 MG chewable tablet Chew 81 mg by mouth daily.   Yes [provider]  carbidopa-levodopa (SINEMET IR) 25-250 MG per tablet Take 1 tablet by mouth 3 (three) times daily.   Yes [provider]  clonazePAM (KLONOPIN) 0.5 MG tablet Take 0.5 tablets (0.25 mg total) by mouth 2 (two) times daily as needed (anxiety). 08/08/17  Yes Mariel Aloe,  MD  clopidogrel (PLAVIX) 75 MG tablet Take 1 tablet (75 mg total) by mouth daily. 10/07/15  Yes Florencia Reasons, MD  entacapone (COMTAN) 200 MG tablet Take 200 mg by mouth 3 (three) times daily.   Yes [provider]  feeding supplement, GLUCERNA SHAKE, (GLUCERNA SHAKE) LIQD Take 237 mLs by mouth 2 (two) times daily between meals. 03/16/16  Yes Kelvin Cellar, MD  ferrous sulfate 325 (65 FE) MG tablet Take 325 mg by mouth 2 (two) times daily.   Yes [provider]  isosorbide mononitrate (IMDUR) 30 MG 24 hr tablet Take 1 tablet (30 mg total) by mouth daily. 10/07/15  Yes Florencia Reasons, MD  lamoTRIgine (LAMICTAL) 150 MG tablet Take 1 tablet (150 mg total) by mouth 2 (two) times daily. 07/17/17  Yes Arfeen, Arlyce Harman, MD  Multiple Vitamin (MULTIVITAMIN WITH MINERALS) TABS tablet Take 1 tablet by mouth daily.   Yes [provider]  nystatin (MYCOSTATIN/NYSTOP) powder Apply to affected area of skin in genital area three times daily for at least 2 weeks, or until resolution of yeast infection. 08/21/17  Yes Street, Mercedes, PA-C  pantoprazole (PROTONIX) 40 MG tablet Take 40 mg by mouth daily. Reported on 07/06/2016   Yes [provider]  polyethylene glycol (MIRALAX / GLYCOLAX) packet Take 17 g by mouth daily.   Yes [provider]  predniSONE (DELTASONE) 2.5 MG tablet Take 2.5 mg by mouth daily with breakfast.   Yes [provider]  risperiDONE (RISPERDAL) 0.25 MG tablet Take 0.25 mg by mouth 3 (three) times daily.   Yes [provider]  rosuvastatin (CRESTOR) 20 MG tablet Take 20 mg by mouth daily.   Yes [provider]  sertraline (ZOLOFT) 100 MG tablet Take 2 tablets (200 mg total) by mouth daily. Patient taking differently: Take 300 mg by mouth daily.  07/17/17  Yes Arfeen, Arlyce Harman, MD  traZODone (DESYREL) 50 MG tablet Take 1 tablet (50 mg total) by mouth at bedtime. 01/17/17  Yes Arfeen, Arlyce Harman, MD  acetaminophen (TYLENOL) 325 MG tablet Take 650  mg by mouth every 8 (eight) hours as needed for mild pain.     [provider]  belladonna-opium (B&O SUPPRETTES) 16.2-30 MG suppository Place 1 suppository rectally every 8 (eight) hours as needed for pain. Patient not taking: Reported on 05/24/3015 0/10/93   Delora Fuel, MD  cephALEXin (KEFLEX) 500 MG capsule Take 1 capsule (500 mg total) by mouth 3 (three) times daily. Patient not taking: Reported on 12/11/2017 09/09/17   Frederica Kuster, PA-C  ciprofloxacin (CIPRO) 500 MG tablet Take 1 tablet (500 mg total) by mouth 2 (two) times daily. One po bid x 7 days Patient not taking: Reported on 01/16/2018 08/21/17   Street, [place], PA-C  doxycycline (VIBRAMYCIN) 100 MG capsule Take 1 capsule (100 mg total) by mouth 2 (two) times daily. Patient not taking: Reported on 01/16/2018 08/23/17   Lajean Saver, MD  lisinopril (PRINIVIL,ZESTRIL) 10 MG tablet Take 0.5 tablets (5 mg total) by  mouth daily. Patient not taking: Reported on 01/16/2018 10/07/15   Florencia Reasons, MD  mupirocin ointment (BACTROBAN) 2 % Place 1 application into the nose 2 (two) times daily. Patient not taking: Reported on 01/16/2018 08/08/17   Mariel Aloe, MD  QUEtiapine (SEROQUEL) 25 MG tablet Take 3 tablets (75 mg total) by mouth at bedtime. Patient not taking: Reported on 12/11/2017 07/17/17   Kathlee Nations, MD    Family History Family History  Problem Relation Age of Onset  . Depression Father   . Cancer Mother   . Heart disease Mother     Social History Social History   Tobacco Use  . Smoking status: Never Smoker  . Smokeless tobacco: Never Used  Substance Use Topics  . Alcohol use: No    Alcohol/week: 0.0 oz    Comment: occasional  . Drug use: No     Allergies   Plant sterols and stanols   Review of Systems Review of Systems Level 5 caveat applies due to dementia.  Physical Exam Updated Vital Signs BP 95/64 (BP Location: Left Arm)   Pulse 70   Temp (!) 100.4 F (38 C) (Rectal)   Resp (!) 26    SpO2 94%   Physical Exam  Constitutional: He appears well-developed and well-nourished. No distress.  HENT:  Head: Normocephalic and atraumatic.  Mouth/Throat: Oropharynx is clear and moist.  Eyes: Pupils are equal, round, and reactive to light.  Neck: Normal range of motion. Neck supple.  Cardiovascular: Normal rate, regular rhythm and normal heart sounds. Exam reveals no gallop and no friction rub.  No murmur heard. Pulmonary/Chest: Effort normal and breath sounds normal. No respiratory distress. He has no wheezes.  Abdominal: Soft. Bowel sounds are normal. He exhibits no distension. There is no tenderness.  Neurological: He is alert. He exhibits normal muscle tone. Coordination normal.  Skin: Skin is warm and dry. Capillary refill takes less than 2 seconds. No rash noted. No erythema.  Psychiatric: He has a normal mood and affect. His behavior is normal.  Nursing note and vitals reviewed.    ED Treatments / Results  Labs (all labs ordered are listed, but only abnormal results are displayed) Labs Reviewed  URINE CULTURE  BASIC METABOLIC PANEL  CBC WITH DIFFERENTIAL/PLATELET  URINALYSIS, ROUTINE W REFLEX MICROSCOPIC  I-STAT CG4 LACTIC ACID, ED    EKG  EKG Interpretation  Date/Time:  Wednesday January 16 2018 14:25:52 EST Ventricular Rate:  69 PR Interval:    QRS Duration: 99 QT Interval:  420 QTC Calculation: 450 R Axis:   -39 Text Interpretation:  Sinus rhythm Left axis deviation Artifact T wave abnormality No significant change since last tracing Abnormal ekg Confirmed by Carmin Muskrat [phone]) on 01/16/2018 3:16:26 PM Also confirmed by Carmin Muskrat ([id number]), editor [place], Jeannetta Nap [phone])  on 01/16/2018 3:58:37 PM       Radiology Dg Chest 2 View  Result Date: 01/16/2018 CLINICAL DATA:  Confusion and lethargy. EXAM: CHEST  2 VIEW COMPARISON:  08/05/2017 FINDINGS: Mild cardiac enlargement. No pleural effusion or edema. No airspace opacities identified. Chronic  left posterior rib deformities again noted. Advanced osteoarthritis noted within both glenohumeral joints. IMPRESSION: 1. No acute cardiopulmonary abnormalities. 2. Cardiac enlargement. Electronically Signed   By: Kerby Moors M.D.   On: 01/16/2018 15:33    Procedures Procedures (including critical care time)  Medications Ordered in ED Medications - No data to display   Initial Impression / Assessment and Plan / ED Course  I have reviewed  the triage vital signs and the nursing notes.  Pertinent labs & imaging results that were available during my care of the patient were reviewed by me and considered in my medical decision making (see chart for details).  Clinical Course as of Jan 17 1621  Wed Jan 16, 2018  1821 Resp: (!) 26 [CG]  1821 Temp: (!) 100.4 F (38 C) [CG]    Clinical Course User Index [CG] Kinnie Feil, PA-C    The patient will be signed out to Carmon Sails PA-C for follow up on the remainder of the labs.  The patient has been stable here in the emergency department thus far.  Final Clinical Impressions(s) / ED Diagnoses   Final diagnoses:  None    ED Discharge Orders    None       Dalia Heading, PA-C 01/17/18 1624    Carmin Muskrat, MD 01/18/18 2202

## 2018-01-16 NOTE — ED Notes (Signed)
[name], [phone] call if patient discharged or admitted.

## 2018-01-16 NOTE — Discharge Instructions (Signed)
Your urine looked infected today however this is not always reliable when you have a chronic Foley. Your already on Keflex for urinary tract infection. I think fever is from an upper respiratory infection which can explain your nasal congestion, cough. Use Tamiflu and albuterol. Flonase for nasal congestion. You should be reevaluated by primary care doctor in 1-2 days to make sure symptoms are improving, repeat urinalysis should . Return to the ED for persistent fever, chest pain, worsening cough, shortness of breath, problems with urination, abdominal pain, increased weakness.

## 2018-01-16 NOTE — ED Notes (Signed)
PA student at bedside.

## 2018-01-16 NOTE — Care Management Note (Signed)
Case Management Note  Pt is currently active for monthly [facility] RN f[facility] care [facility]. Lattie Haw with [facility] is aware pt is in the ED at this time.

## 2018-01-16 NOTE — ED Notes (Addendum)
Patient yelling out that he doesn't wont the door shut. Informed patient that his door is open, and asked if he needs anything or if I could help him with anything. Patient yells, "just get the HELL out of here!"

## 2018-01-16 NOTE — ED Notes (Signed)
[facility] called for pt 

## 2018-01-16 NOTE — ED Triage Notes (Signed)
Both staff and pt's. Family report pt. To have new confusion and lethargy this morning. They were concerned d/t pt's. Past hx of pneumonia and not infrequent uti's. He arrives here awake, alert and in no distress. CBG for EMS was 144.

## 2018-01-16 NOTE — ED Provider Notes (Signed)
Patient handed off to me by previous EDPA lawyer at shift change pending urinalysis.Please see previous note for full history. I contacted patient's power of attorney, [name] to obtain more information in regards to presentation today. States patient was noted to be more congested with cough, runny nose and more somnolent.  Slightly more weak today. Patient typically presents like this when he has either pneumonia or urinary tract infection.  On my exam VS are mostly WNL, he is slightly tachypnic. Lungs CTAB, Abdomen benign. Nasal congestion and erythematous oropharynx. Given symptoms and exam, suspect viral URI/influenza. POA states he "always has  UTI" and is on suppressive antibiotics (keflex). Pt has no urinary symptoms. Review of previous U/A show urine always looks infected. I think fever today is from URI.  He is not hypoxic or requiring oxygen here. He is mentating at baseline.   Labs reassuring. Mild leukocytosis, lactic acid normal. CXR negative. Will treat for influenza. I think pt is adequate for discharge with tamiflu, flonase, albuterol inhaler and f/u in PCP in 24-48 hours for re-evaluation of clinical decline. I suspect fever is from URI and not UTI as he has chronic foley, however will give dose of rocephin in ED before d/c. Discussed plan to d/c with POA and pt, they prefer discharge at this time. They will be able to f/u with PCP in 1-2 days. Discussed return precautions.    Kinnie Feil, PA-C 01/17/18 2043    Valarie Merino, MD 01/20/18 [phone]

## 2018-01-16 NOTE — ED Notes (Addendum)
ED Provider at bedside giving patient something to drink.

## 2018-01-16 NOTE — Care Management Note (Signed)
Case Management Note  CM spoke with Lawyer, [place] and discussed increasing RN services.  He requested the RN start visiting at least 1 x per week for now.  Updated Lattie Haw with [facility].  No further CM needs at this time.

## 2018-01-16 NOTE — ED Notes (Signed)
Patients POA at bedsdie. Requesting to be updated and called with results when she leaves.

## 2018-01-16 NOTE — ED Notes (Signed)
Patient given urinal and instructed to collect sample.

## 2018-01-20 LAB — URINE CULTURE: Culture: >=100000 — AB

## 2018-01-21 ENCOUNTER — Telehealth: Payer: Self-pay | Admitting: Emergency Medicine

## 2018-01-21 NOTE — Telephone Encounter (Signed)
Post ED Visit - Positive Culture Follow-up  Culture report reviewed by antimicrobial stewardship pharmacist:  []  Elenor Quinones, Pharm.D. []  Heide Guile, Pharm.D., BCPS AQ-ID [x]  Parks Neptune, Pharm.D., BCPS []  Alycia Rossetti, Pharm.D., BCPS []  [place], Pharm.D., BCPS, AAHIVP []  Legrand Como, Pharm.D., BCPS, AAHIVP []  Salome Arnt, PharmD, BCPS []  Jalene Mullet, PharmD []  Vincenza Hews, PharmD, BCPS  Positive urine culture Treated with cephalexin, organism sensitive to the same and no further patient follow-up is required at this time.  Hazle Nordmann 01/21/2018, 10:24 AM

## 2018-01-21 NOTE — Progress Notes (Signed)
ED Antimicrobial Stewardship Positive Culture Follow Up   [name]. is an 83 y.o. male who presented to [facility] on 01/16/2018 with a chief complaint of  Chief Complaint  Patient presents with  . Weakness    Recent Results (from the past 720 hour(s))  Urine culture     Status: Abnormal   Collection Time: 01/16/18  5:15 PM  Result Value Ref Range Status   Specimen Description   Final    URINE, CATHETERIZED Performed at [facility] [address] [place], [place] [address]    Special Requests   Final    NONE Performed at [facility] [place] [address] [place], [place] [address]    Culture (A)  Final    >=100,000 COLONIES/mL CITROBACTER FREUNDII 50,000 COLONIES/mL ENTEROCOCCUS FAECALIS    Report Status 01/20/2018 FINAL  Final   Organism ID, Bacteria CITROBACTER FREUNDII (A)  Final   Organism ID, Bacteria ENTEROCOCCUS FAECALIS (A)  Final      Susceptibility   Citrobacter freundii - MIC*    CEFAZOLIN >=64 RESISTANT Resistant     CEFTRIAXONE <=1 SENSITIVE Sensitive     CIPROFLOXACIN <=0.25 SENSITIVE Sensitive     GENTAMICIN <=1 SENSITIVE Sensitive     IMIPENEM <=0.25 SENSITIVE Sensitive     NITROFURANTOIN <=16 SENSITIVE Sensitive     TRIMETH/SULFA <=20 SENSITIVE Sensitive     PIP/TAZO <=4 SENSITIVE Sensitive     * >=100,000 COLONIES/mL CITROBACTER FREUNDII   Enterococcus faecalis - MIC*    AMPICILLIN <=2 SENSITIVE Sensitive     LEVOFLOXACIN >=8 RESISTANT Resistant     NITROFURANTOIN <=16 SENSITIVE Sensitive     VANCOMYCIN 1 SENSITIVE Sensitive     * 50,000 COLONIES/mL ENTEROCOCCUS FAECALIS   No urinary symptoms, no abx indicated  ED Provider: Geanie Kenning, PA-C  [name] 01/21/2018, 8:26 AM Infectious Diseases Pharmacist Phone# [phone]

## 2018-03-05 ENCOUNTER — Ambulatory Visit: Payer: Medicare Other | Admitting: Podiatry

## 2018-03-21 ENCOUNTER — Encounter (HOSPITAL_COMMUNITY): Payer: Self-pay | Admitting: Emergency Medicine

## 2018-03-21 ENCOUNTER — Other Ambulatory Visit: Payer: Self-pay

## 2018-03-21 ENCOUNTER — Emergency Department (HOSPITAL_COMMUNITY): Payer: Medicare Other

## 2018-03-21 ENCOUNTER — Inpatient Hospital Stay (HOSPITAL_COMMUNITY)
Admission: EM | Admit: 2018-03-21 | Discharge: 2018-03-23 | DRG: 699 | Disposition: A | Discharge status: Skilled Nursing Facility | Payer: Medicare Other | Attending: Internal Medicine | Admitting: Internal Medicine

## 2018-03-21 DIAGNOSIS — F02818 Dementia in other diseases classified elsewhere, unspecified severity, with other behavioral disturbance: Secondary | ICD-10-CM | POA: Diagnosis present

## 2018-03-21 DIAGNOSIS — N319 Neuromuscular dysfunction of bladder, unspecified: Secondary | ICD-10-CM | POA: Diagnosis present

## 2018-03-21 DIAGNOSIS — T83518A Infection and inflammatory reaction due to other urinary catheter, initial encounter: Principal | ICD-10-CM | POA: Diagnosis present

## 2018-03-21 DIAGNOSIS — Z96649 Presence of unspecified artificial hip joint: Secondary | ICD-10-CM | POA: Diagnosis present

## 2018-03-21 DIAGNOSIS — F0281 Dementia in other diseases classified elsewhere with behavioral disturbance: Secondary | ICD-10-CM | POA: Diagnosis present

## 2018-03-21 DIAGNOSIS — N400 Enlarged prostate without lower urinary tract symptoms: Secondary | ICD-10-CM | POA: Diagnosis present

## 2018-03-21 DIAGNOSIS — I2510 Atherosclerotic heart disease of native coronary artery without angina pectoris: Secondary | ICD-10-CM | POA: Diagnosis present

## 2018-03-21 DIAGNOSIS — Z87440 Personal history of urinary (tract) infections: Secondary | ICD-10-CM | POA: Diagnosis not present

## 2018-03-21 DIAGNOSIS — N390 Urinary tract infection, site not specified: Secondary | ICD-10-CM | POA: Diagnosis present

## 2018-03-21 DIAGNOSIS — Z7902 Long term (current) use of antithrombotics/antiplatelets: Secondary | ICD-10-CM

## 2018-03-21 DIAGNOSIS — E119 Type 2 diabetes mellitus without complications: Secondary | ICD-10-CM | POA: Diagnosis present

## 2018-03-21 DIAGNOSIS — Z66 Do not resuscitate: Secondary | ICD-10-CM | POA: Diagnosis present

## 2018-03-21 DIAGNOSIS — Z960 Presence of urogenital implants: Secondary | ICD-10-CM | POA: Diagnosis not present

## 2018-03-21 DIAGNOSIS — F319 Bipolar disorder, unspecified: Secondary | ICD-10-CM | POA: Diagnosis present

## 2018-03-21 DIAGNOSIS — Z79899 Other long term (current) drug therapy: Secondary | ICD-10-CM | POA: Diagnosis not present

## 2018-03-21 DIAGNOSIS — T83511A Infection and inflammatory reaction due to indwelling urethral catheter, initial encounter: Secondary | ICD-10-CM

## 2018-03-21 DIAGNOSIS — Z978 Presence of other specified devices: Secondary | ICD-10-CM

## 2018-03-21 DIAGNOSIS — F419 Anxiety disorder, unspecified: Secondary | ICD-10-CM | POA: Diagnosis present

## 2018-03-21 DIAGNOSIS — I10 Essential (primary) hypertension: Secondary | ICD-10-CM | POA: Diagnosis present

## 2018-03-21 DIAGNOSIS — G20 Parkinson's disease: Secondary | ICD-10-CM | POA: Diagnosis present

## 2018-03-21 DIAGNOSIS — Y846 Urinary catheterization as the cause of abnormal reaction of the patient, or of later complication, without mention of misadventure at the time of the procedure: Secondary | ICD-10-CM | POA: Diagnosis present

## 2018-03-21 DIAGNOSIS — F0390 Unspecified dementia without behavioral disturbance: Secondary | ICD-10-CM | POA: Diagnosis present

## 2018-03-21 DIAGNOSIS — Z7982 Long term (current) use of aspirin: Secondary | ICD-10-CM

## 2018-03-21 DIAGNOSIS — D72829 Elevated white blood cell count, unspecified: Secondary | ICD-10-CM | POA: Diagnosis present

## 2018-03-21 DIAGNOSIS — R4182 Altered mental status, unspecified: Secondary | ICD-10-CM

## 2018-03-21 LAB — URINALYSIS, ROUTINE W REFLEX MICROSCOPIC
BILIRUBIN URINE: NEGATIVE
Bilirubin Urine: NEGATIVE
GLUCOSE, UA: NEGATIVE mg/dL
Glucose, UA: NEGATIVE mg/dL
KETONES UR: NEGATIVE mg/dL
Ketones, ur: 5 mg/dL — AB
NITRITE: POSITIVE — AB
Nitrite: NEGATIVE
PH: 6 (ref 5.0–8.0)
PROTEIN: 30 mg/dL — AB
Protein, ur: NEGATIVE mg/dL
Specific Gravity, Urine: 1.015 (ref 1.005–1.030)
Specific Gravity, Urine: 1.016 (ref 1.005–1.030)
Squamous Epithelial / LPF: NONE SEEN
pH: 6 (ref 5.0–8.0)

## 2018-03-21 LAB — CBC WITH DIFFERENTIAL/PLATELET
BASOS ABS: 0 10*3/uL (ref 0.0–0.1)
BASOS PCT: 0 %
Eosinophils Absolute: 0.1 10*3/uL (ref 0.0–0.7)
Eosinophils Relative: 0 %
HCT: 42.3 % (ref 39.0–52.0)
HEMOGLOBIN: 14.2 g/dL (ref 13.0–17.0)
Lymphocytes Relative: 2 %
Lymphs Abs: 0.5 10*3/uL — ABNORMAL LOW (ref 0.7–4.0)
MCH: 30.4 pg (ref 26.0–34.0)
MCHC: 33.6 g/dL (ref 30.0–36.0)
MCV: 90.6 fL (ref 78.0–100.0)
Monocytes Absolute: 1.1 10*3/uL — ABNORMAL HIGH (ref 0.1–1.0)
Monocytes Relative: 5 %
NEUTROS PCT: 93 %
Neutro Abs: 18.8 10*3/uL — ABNORMAL HIGH (ref 1.7–7.7)
Platelets: 185 10*3/uL (ref 150–400)
RBC: 4.67 MIL/uL (ref 4.22–5.81)
RDW: 14.8 % (ref 11.5–15.5)
WBC: 20.5 10*3/uL — AB (ref 4.0–10.5)

## 2018-03-21 LAB — BASIC METABOLIC PANEL
Anion gap: 12 (ref 5–15)
BUN: 24 mg/dL — ABNORMAL HIGH (ref 6–20)
CALCIUM: 9.4 mg/dL (ref 8.9–10.3)
CO2: 24 mmol/L (ref 22–32)
CREATININE: 0.95 mg/dL (ref 0.61–1.24)
Chloride: 103 mmol/L (ref 101–111)
Glucose, Bld: 108 mg/dL — ABNORMAL HIGH (ref 65–99)
Potassium: 4.4 mmol/L (ref 3.5–5.1)
SODIUM: 139 mmol/L (ref 135–145)

## 2018-03-21 MED ORDER — GLUCERNA SHAKE PO LIQD
237.0000 mL | Freq: Two times a day (BID) | ORAL | Status: DC
  Administered 2018-03-22 – 2018-03-23 (×3): 237 mL via ORAL
  Filled 2018-03-21 (×4): qty 237

## 2018-03-21 MED ORDER — RISPERIDONE 0.25 MG PO TABS
0.2500 mg | ORAL_TABLET | Freq: Three times a day (TID) | ORAL | Status: DC
  Administered 2018-03-21 – 2018-03-23 (×6): 0.25 mg via ORAL
  Filled 2018-03-21 (×2): qty 0.5
  Filled 2018-03-21: qty 1
  Filled 2018-03-21: qty 0.5
  Filled 2018-03-21: qty 1
  Filled 2018-03-21 (×4): qty 0.5
  Filled 2018-03-21 (×2): qty 1

## 2018-03-21 MED ORDER — ROSUVASTATIN CALCIUM 20 MG PO TABS
20.0000 mg | ORAL_TABLET | Freq: Every day | ORAL | Status: DC
  Administered 2018-03-22 – 2018-03-23 (×2): 20 mg via ORAL
  Filled 2018-03-21 (×2): qty 1
  Filled 2018-03-21: qty 2

## 2018-03-21 MED ORDER — TRAZODONE HCL 50 MG PO TABS
50.0000 mg | ORAL_TABLET | Freq: Every day | ORAL | Status: DC
  Administered 2018-03-21 – 2018-03-22 (×2): 50 mg via ORAL
  Filled 2018-03-21 (×2): qty 1

## 2018-03-21 MED ORDER — ENOXAPARIN SODIUM 40 MG/0.4ML ~~LOC~~ SOLN
40.0000 mg | SUBCUTANEOUS | Status: DC
  Administered 2018-03-21 – 2018-03-22 (×2): 40 mg via SUBCUTANEOUS
  Filled 2018-03-21 (×2): qty 0.4

## 2018-03-21 MED ORDER — CLOPIDOGREL BISULFATE 75 MG PO TABS
75.0000 mg | ORAL_TABLET | Freq: Every day | ORAL | Status: DC
  Administered 2018-03-22 – 2018-03-23 (×2): 75 mg via ORAL
  Filled 2018-03-21 (×2): qty 1

## 2018-03-21 MED ORDER — ALBUTEROL SULFATE (2.5 MG/3ML) 0.083% IN NEBU: 2.5000 mg | INHALATION_SOLUTION | Freq: Four times a day (QID) | RESPIRATORY_TRACT | Status: DC | PRN

## 2018-03-21 MED ORDER — NYSTATIN 100000 UNIT/GM EX POWD
1.0000 g | Freq: Two times a day (BID) | CUTANEOUS | Status: DC
  Administered 2018-03-21 – 2018-03-23 (×4): 1 g via TOPICAL
  Filled 2018-03-21: qty 15

## 2018-03-21 MED ORDER — ACETAMINOPHEN 325 MG PO TABS: 650.0000 mg | ORAL_TABLET | Freq: Four times a day (QID) | ORAL | Status: DC | PRN

## 2018-03-21 MED ORDER — ASPIRIN 81 MG PO CHEW
81.0000 mg | CHEWABLE_TABLET | Freq: Every day | ORAL | Status: DC
  Administered 2018-03-22 – 2018-03-23 (×2): 81 mg via ORAL
  Filled 2018-03-21 (×2): qty 1

## 2018-03-21 MED ORDER — ONDANSETRON HCL 4 MG/2ML IJ SOLN: 4.0000 mg | Freq: Four times a day (QID) | INTRAMUSCULAR | Status: DC | PRN

## 2018-03-21 MED ORDER — ALBUTEROL SULFATE HFA 108 (90 BASE) MCG/ACT IN AERS: 2.0000 | INHALATION_SPRAY | Freq: Four times a day (QID) | RESPIRATORY_TRACT | Status: DC | PRN

## 2018-03-21 MED ORDER — CARBIDOPA-LEVODOPA 25-250 MG PO TABS
1.0000 | ORAL_TABLET | ORAL | Status: DC
  Administered 2018-03-22 – 2018-03-23 (×5): 1 via ORAL
  Filled 2018-03-21 (×6): qty 1

## 2018-03-21 MED ORDER — PIPERACILLIN-TAZOBACTAM 3.375 G IVPB
3.3750 g | Freq: Three times a day (TID) | INTRAVENOUS | Status: DC
  Administered 2018-03-22 – 2018-03-23 (×4): 3.375 g via INTRAVENOUS
  Filled 2018-03-21 (×7): qty 50

## 2018-03-21 MED ORDER — ADULT MULTIVITAMIN W/MINERALS CH
1.0000 | ORAL_TABLET | Freq: Every day | ORAL | Status: DC
  Administered 2018-03-22: 1 via ORAL
  Filled 2018-03-21 (×2): qty 1

## 2018-03-21 MED ORDER — CLONAZEPAM 0.5 MG PO TABS: 0.2500 mg | ORAL_TABLET | Freq: Two times a day (BID) | ORAL | Status: DC | PRN

## 2018-03-21 MED ORDER — ONDANSETRON HCL 4 MG PO TABS: 4.0000 mg | ORAL_TABLET | Freq: Four times a day (QID) | ORAL | Status: DC | PRN

## 2018-03-21 MED ORDER — PREDNISONE 2.5 MG PO TABS
2.5000 mg | ORAL_TABLET | Freq: Every day | ORAL | Status: DC
  Administered 2018-03-22 – 2018-03-23 (×2): 2.5 mg via ORAL
  Filled 2018-03-21 (×4): qty 1

## 2018-03-21 MED ORDER — ACETAMINOPHEN 650 MG RE SUPP: 650.0000 mg | Freq: Four times a day (QID) | RECTAL | Status: DC | PRN

## 2018-03-21 MED ORDER — PANTOPRAZOLE SODIUM 40 MG PO TBEC
40.0000 mg | DELAYED_RELEASE_TABLET | Freq: Every day | ORAL | Status: DC
  Administered 2018-03-22 – 2018-03-23 (×2): 40 mg via ORAL
  Filled 2018-03-21 (×2): qty 1

## 2018-03-21 MED ORDER — PIPERACILLIN-TAZOBACTAM 3.375 G IVPB 30 MIN
3.3750 g | Freq: Once | INTRAVENOUS | Status: AC
  Administered 2018-03-21: 3.375 g via INTRAVENOUS
  Filled 2018-03-21: qty 50

## 2018-03-21 MED ORDER — POLYETHYLENE GLYCOL 3350 17 G PO PACK
17.0000 g | PACK | Freq: Every day | ORAL | Status: DC
  Administered 2018-03-22 – 2018-03-23 (×2): 17 g via ORAL
  Filled 2018-03-21 (×2): qty 1

## 2018-03-21 MED ORDER — ISOSORBIDE MONONITRATE ER 30 MG PO TB24
30.0000 mg | ORAL_TABLET | Freq: Every day | ORAL | Status: DC
  Administered 2018-03-22 – 2018-03-23 (×2): 30 mg via ORAL
  Filled 2018-03-21 (×2): qty 1

## 2018-03-21 MED ORDER — LAMOTRIGINE 25 MG PO TABS
150.0000 mg | ORAL_TABLET | Freq: Two times a day (BID) | ORAL | Status: DC
  Administered 2018-03-21 – 2018-03-23 (×4): 150 mg via ORAL
  Filled 2018-03-21 (×4): qty 1

## 2018-03-21 MED ORDER — SERTRALINE HCL 50 MG PO TABS
300.0000 mg | ORAL_TABLET | Freq: Every day | ORAL | Status: DC
  Administered 2018-03-22: 300 mg via ORAL
  Filled 2018-03-21: qty 6

## 2018-03-21 MED ORDER — ENTACAPONE 200 MG PO TABS
200.0000 mg | ORAL_TABLET | ORAL | Status: DC
  Administered 2018-03-22 – 2018-03-23 (×5): 200 mg via ORAL
  Filled 2018-03-21 (×6): qty 1

## 2018-03-21 MED ORDER — FERROUS SULFATE 325 (65 FE) MG PO TABS
325.0000 mg | ORAL_TABLET | Freq: Two times a day (BID) | ORAL | Status: DC
  Administered 2018-03-21 – 2018-03-23 (×4): 325 mg via ORAL
  Filled 2018-03-21 (×4): qty 1

## 2018-03-21 NOTE — ED Notes (Signed)
Delay in obtaining urine sample- changed leg bag of foley to standard drainage bag. Will obtain new sample as urine is produced.

## 2018-03-21 NOTE — H&P (Signed)
History and Physical    [facility] NWG:[id number] DOB: 1935-09-23 DOA: 03/21/2018  PCP: Jilda Panda, MD  Patient coming from: SNF  I have personally briefly reviewed patient's old medical records in [place]  Chief Complaint: AMS, aggressive behavior  HPI: [name]. is a 83 y.o. male with medical history significant of parkinson's dementia, chronic indwelling foley.  NH reports increased aggressive behavior for the past few days.  In the past when he has had this it has been due to UTI.  He does have severe dementia, and some aggressive behavior at baseline chronically, but worse these past couple of days.  Patient not really able to give much history so history obtained from records.   ED Course: UA confirms UTI.  WBC 20k.  Of note has has pseudomonas that is resistant to cipro for last couple of UTIs.  Also enterococcus.  Will need to be admitted for IV abx since no PO options available for the pseudomonas he has had the last 4+ times.   Review of Systems: Unable to perform due to dementia.  Past Medical History:  Diagnosis Date  . Anxiety   . Arthritis   . Benign prostate hyperplasia   . Bipolar disorder ([place])   . C2 cervical fracture ([name])    due to pt fall  . Chronic indwelling Foley catheter   . Depression   . Diabetes mellitus type II   . Falls   . Flu   . Hearing aid worn   . Hypertension   . Memory difficulty 08/21/2014  . Memory loss   . MRSA infection (methicillin-resistant Staphylococcus aureus)   . Neuromuscular disorder ([place])    parkinsons  . Parkinson disease ([place])   . Pneumonia   . SIRS (systemic inflammatory response syndrome) ([name])   . UTI (urinary tract infection)     Past Surgical History:  Procedure Laterality Date  . TOTAL HIP ARTHROPLASTY    . VIDEO ASSISTED THORACOSCOPY (VATS)/EMPYEMA  12/16/2012   Procedure: VIDEO ASSISTED THORACOSCOPY (VATS)/EMPYEMA;  Surgeon: Melrose Nakayama, MD;  Location: [facility];  Service:  Thoracic;  Laterality: Right;  Marland Kitchen VIDEO BRONCHOSCOPY  12/16/2012   Procedure: VIDEO BRONCHOSCOPY;  Surgeon: Melrose Nakayama, MD;  Location: [facility];  Service: Thoracic;  Laterality: Right;     reports that he has never smoked. He has never used smokeless tobacco. He reports that he does not drink alcohol or use drugs.  Allergies  Allergen Reactions  . Plant Sterols And Stanols Other (See Comments)    ON MAR    Family History  Problem Relation Age of Onset  . Depression Father   . Cancer Mother   . Heart disease Mother      Prior to Admission medications   Medication Sig Start Date End Date Taking? Authorizing Provider  acetaminophen (TYLENOL) 325 MG tablet Take 650 mg by mouth every 6 (six) hours as needed (pain). Not to exceed 8 tabs per 24hrs.   Yes [provider]  albuterol (PROVENTIL HFA;VENTOLIN HFA) 108 (90 Base) MCG/ACT inhaler Inhale 1-2 puffs into the lungs every 6 (six) hours as needed for wheezing or shortness of breath. Patient taking differently: Inhale 2 puffs into the lungs every 6 (six) hours as needed for wheezing or shortness of breath.  01/16/18  Yes Kinnie Feil, PA-C  aspirin 81 MG chewable tablet Chew 81 mg by mouth daily.   Yes [provider]  carbidopa-levodopa (SINEMET IR) 25-250 MG per  tablet Take 1 tablet by mouth 3 (three) times daily. At 7am, 2pm, 7pm.   Yes [provider]  clonazePAM (KLONOPIN) 0.5 MG tablet Take 0.5 tablets (0.25 mg total) by mouth 2 (two) times daily as needed (anxiety). 08/08/17  Yes Mariel Aloe, MD  clopidogrel (PLAVIX) 75 MG tablet Take 1 tablet (75 mg total) by mouth daily. 10/07/15  Yes Florencia Reasons, MD  entacapone (COMTAN) 200 MG tablet Take 200 mg by mouth 3 (three) times daily. At 7am 2pm, 7pm.   Yes [provider]  feeding supplement, GLUCERNA SHAKE, (GLUCERNA SHAKE) LIQD Take 237 mLs by mouth 2 (two) times daily between meals. 03/16/16  Yes Kelvin Cellar, MD  ferrous sulfate 325  (65 FE) MG tablet Take 325 mg by mouth 2 (two) times daily.   Yes [provider]  isosorbide mononitrate (IMDUR) 30 MG 24 hr tablet Take 1 tablet (30 mg total) by mouth daily. 10/07/15  Yes Florencia Reasons, MD  lamoTRIgine (LAMICTAL) 150 MG tablet Take 1 tablet (150 mg total) by mouth 2 (two) times daily. 07/17/17  Yes Arfeen, Arlyce Harman, MD  Multiple Vitamin (MULTIVITAMIN WITH MINERALS) TABS tablet Take 1 tablet by mouth daily.   Yes [provider]  nystatin (NYSTATIN) powder Apply 1 g topically 2 (two) times daily. Apply to groin for redness   Yes [provider]  pantoprazole (PROTONIX) 40 MG tablet Take 40 mg by mouth daily. Reported on 07/06/2016   Yes [provider]  polyethylene glycol (MIRALAX / GLYCOLAX) packet Take 17 g by mouth daily.   Yes [provider]  predniSONE (DELTASONE) 2.5 MG tablet Take 2.5 mg by mouth daily with breakfast.   Yes [provider]  risperiDONE (RISPERDAL) 0.25 MG tablet Take 0.25 mg by mouth 3 (three) times daily.   Yes [provider]  rosuvastatin (CRESTOR) 20 MG tablet Take 20 mg by mouth daily.   Yes [provider]  sertraline (ZOLOFT) 100 MG tablet Take 2 tablets (200 mg total) by mouth daily. Patient taking differently: Take 300 mg by mouth daily.  07/17/17  Yes Arfeen, Arlyce Harman, MD  traZODone (DESYREL) 50 MG tablet Take 1 tablet (50 mg total) by mouth at bedtime. 01/17/17  Yes Kathlee Nations, MD    Physical Exam: Vitals:   03/21/18 1335 03/21/18 1739 03/21/18 1925  BP: (!) 173/79 (!) 133/106 128/76  Pulse: 88 86 78  Resp: 19 20 20   Temp: 99.1 F (37.3 C)    TempSrc: Oral    SpO2: 99% 98% 95%    Constitutional: NAD, calm, comfortable Eyes: PERRL, lids and conjunctivae normal ENMT: Mucous membranes are moist. Posterior pharynx clear of any exudate or lesions.Normal dentition.  Neck: normal, supple, no masses, no thyromegaly Respiratory: clear to auscultation bilaterally, no wheezing,  no crackles. Normal respiratory effort. No accessory muscle use.  Cardiovascular: Regular rate and rhythm, no murmurs / rubs / gallops. No extremity edema. 2+ pedal pulses. No carotid bruits.  Abdomen: no tenderness, no masses palpated. No hepatosplenomegaly. Bowel sounds positive.  Musculoskeletal: no clubbing / cyanosis. No joint deformity upper and lower extremities. Good ROM, no contractures. Normal muscle tone.  Skin: no rashes, lesions, ulcers. No induration Neurologic: CN 2-12 grossly intact. Sensation intact, DTR normal. Strength 5/5 in all 4.  Psychiatric: Demented, somewhat suspicious initially when told he was being admitted to hospital but redirectable.   Labs on Admission: I have personally reviewed following labs and imaging studies  CBC: Recent Labs  Lab 03/21/18 1545  WBC 20.5*  NEUTROABS 18.8*  HGB 14.2  HCT 42.3  MCV 90.6  PLT 846   Basic Metabolic Panel: Recent Labs  Lab 03/21/18 1545  NA 139  K 4.4  CL 103  CO2 24  GLUCOSE 108*  BUN 24*  CREATININE 0.95  CALCIUM 9.4   GFR: CrCl cannot be calculated (Unknown ideal weight.). Liver Function Tests: No results for input(s): AST, ALT, ALKPHOS, BILITOT, PROT, ALBUMIN in the last 168 hours. No results for input(s): LIPASE, AMYLASE in the last 168 hours. No results for input(s): AMMONIA in the last 168 hours. Coagulation Profile: No results for input(s): INR, PROTIME in the last 168 hours. Cardiac Enzymes: No results for input(s): CKTOTAL, CKMB, CKMBINDEX, TROPONINI in the last 168 hours. BNP (last 3 results) No results for input(s): PROBNP in the last 8760 hours. HbA1C: No results for input(s): HGBA1C in the last 72 hours. CBG: No results for input(s): GLUCAP in the last 168 hours. Lipid Profile: No results for input(s): CHOL, HDL, LDLCALC, TRIG, CHOLHDL, LDLDIRECT in the last 72 hours. Thyroid Function Tests: No results for input(s): TSH, T4TOTAL, FREET4, T3FREE, THYROIDAB in the last 72  hours. Anemia Panel: No results for input(s): VITAMINB12, FOLATE, FERRITIN, TIBC, IRON, RETICCTPCT in the last 72 hours. Urine analysis:    Component Value Date/Time   COLORURINE YELLOW 03/21/2018 1910   APPEARANCEUR HAZY (A) 03/21/2018 1910   LABSPEC 1.016 03/21/2018 1910   PHURINE 6.0 03/21/2018 1910   GLUCOSEU NEGATIVE 03/21/2018 1910   HGBUR LARGE (A) 03/21/2018 1910   BILIRUBINUR NEGATIVE 03/21/2018 1910   KETONESUR NEGATIVE 03/21/2018 1910   PROTEINUR 30 (A) 03/21/2018 1910   UROBILINOGEN 0.2 10/05/2015 1555   NITRITE NEGATIVE 03/21/2018 1910   LEUKOCYTESUR LARGE (A) 03/21/2018 1910    Radiological Exams on Admission: Dg Chest 2 View  Result Date: 03/21/2018 CLINICAL DATA:  Altered mental status.  Aggression. EXAM: CHEST - 2 VIEW COMPARISON:  01/16/2018 FINDINGS: Stable mild cardiomegaly. Aortic atherosclerosis. Low lung volumes again seen. Mild scarring in the right midlung is stable. No evidence of pulmonary infiltrate or edema. No evidence of pneumothorax or pleural effusion. Multiple old left rib fracture deformities and severe bilateral glenohumeral osteoarthritis again noted. IMPRESSION: Stable mild cardiomegaly and mild right lung scarring. No active lung disease. Electronically Signed   By: Earle Gell M.D.   On: 03/21/2018 19:06   Ct Head Wo Contrast  Result Date: 03/21/2018 CLINICAL DATA:  Altered mental status. EXAM: CT HEAD WITHOUT CONTRAST TECHNIQUE: Contiguous axial images were obtained from the base of the skull through the vertex without intravenous contrast. COMPARISON:  CT head dated August 06, 2017. FINDINGS: Brain: No evidence of acute infarction, hemorrhage, hydrocephalus, extra-axial collection or mass lesion/mass effect. Stable atrophy and chronic microvascular ischemic changes. Unchanged posterior fossa arachnoid cyst. Vascular: Calcified atherosclerosis at the skullbase. No hyperdense vessel. Skull: Normal. Negative for fracture or focal lesion. Sinuses/Orbits:  Tiny air-fluid level in the left sphenoid sinus. Remaining paranasal sinuses are clear. The mastoid air cells are underdeveloped. No acute orbital abnormality. Other: None. IMPRESSION: 1.  No acute intracranial abnormality. Electronically Signed   By: Titus Dubin M.D.   On: 03/21/2018 16:34    EKG: Independently reviewed.  Assessment/Plan Principal Problem:   Catheter-associated urinary tract infection ([name]) Active Problems:   Diabetes mellitus, type 2 ([name])   Chronic indwelling Foley catheter   Dementia due to Parkinson's disease with behavioral disturbance ([place])    1. CAUTI - with h/o recurrent  pseudomonas 1. Zosyn empirically for now 2. Culture pending 3. Foley changed out 2. Parkinson's dementia - with slight worsening of agitation apparently due to UTI, h/o same 1. Continue home meds 3. DM2 - looks to be diet controlled  DVT prophylaxis: Lovenox Code Status: DNR Family Communication: No family in room, wasn't able to get a-hold of [place] phone # [phone] to let her know he was being admitted. Disposition Plan: SNF after admit Consults called: None Admission status: Admit to inpatient - inpatient status for IV abx due to h/o recurrent pseudomonal UTIs requiring IV abx.   Etta Quill DO [facility] Pager [phone]  If 7AM-7PM, please contact day team taking care of patient [email or web address] Password [facility]  03/21/2018, 8:36 PM

## 2018-03-21 NOTE — Progress Notes (Signed)
Pharmacy Antibiotic Note  Dwayne Aro. is a 83 y.o. male admitted on 03/21/2018 with UTI.  Pharmacy has been consulted for Zosyn dosing. Note patient has had multiple UTIs in past including pseudomonas, citrobacter, MSSA, and enterococcus  Plan: Zosyn 3.375g IV q8h (4 hour infusion). Will sign off    Temp (24hrs), Avg:99.1 F (37.3 C), Min:99.1 F (37.3 C), Max:99.1 F (37.3 C)  Recent Labs  Lab 03/21/18 1545  WBC 20.5*  CREATININE 0.95    CrCl cannot be calculated (Unknown ideal weight.).    Allergies  Allergen Reactions  . Plant Sterols And Stanols Other (See Comments)    ON MAR    Thank you for allowing pharmacy to be a part of this patient's care.   Adrian Saran, PharmD, BCPS Pager [phone] 03/21/2018 8:27 PM

## 2018-03-21 NOTE — ED Triage Notes (Signed)
Per [facility] pt from [facility] for AMS and being more aggressive than pt baseline.

## 2018-03-21 NOTE — ED Notes (Signed)
Patient transported to X-ray 

## 2018-03-21 NOTE — ED Provider Notes (Signed)
[facility] DEPT Provider Note   CSN: [id number] Arrival date & time: 03/21/18  1323     History   Chief Complaint Chief Complaint  Patient presents with  . Altered Mental Status  . Aggressive Behavior    HPI [name]. is a 83 y.o. male.  Patient reportedly sent here for evaluation of aggressive behavior.  This is apparently a worsening situation, but chronically present.  Limited clinical data available from his nursing care facility.  Level 5 caveat-dementia  HPI  Past Medical History:  Diagnosis Date  . Anxiety   . Arthritis   . Benign prostate hyperplasia   . Bipolar disorder ([place])   . C2 cervical fracture ([name])    due to pt fall  . Chronic indwelling Foley catheter   . Depression   . Diabetes mellitus type II   . Falls   . Flu   . Hearing aid worn   . Hypertension   . Memory difficulty 08/21/2014  . Memory loss   . MRSA infection (methicillin-resistant Staphylococcus aureus)   . Neuromuscular disorder ([place])    parkinsons  . Parkinson disease ([place])   . Pneumonia   . SIRS (systemic inflammatory response syndrome) ([name])   . UTI (urinary tract infection)     Patient Active Problem List   Diagnosis Date Noted  . Dementia due to Parkinson's disease with behavioral disturbance ([place]) 08/06/2017  . Pyelonephritis 08/06/2017  . Contusion of head 08/06/2017  . Pressure injury of skin 08/06/2017  . Complicated UTI (urinary tract infection) 08/05/2017  . CAP (community acquired pneumonia) 11/29/2016  . Bacteremia 03/14/2016  . HCAP (healthcare-associated pneumonia)   . Pressure ulcer 10/01/2015  . Right lower lobe pneumonia ([place]) 10/01/2015  . Sepsis due to Gram-negative organism with acute respiratory failure ([place]) 10/01/2015  . Acute renal failure ([place]) 10/01/2015  . Acute respiratory failure with hypoxia ([place]) 09/30/2015  . Influenza A (H1N1) 03/25/2015  . Dementia without behavioral disturbance 03/25/2015  .  Hyperkalemia 03/25/2015  . Falls 03/16/2015  . Sacral pressure ulcer 03/16/2015  . Fever 03/16/2015  . Memory difficulty 08/21/2014  . Malnutrition of moderate degree ([place]) 04/19/2014  . UTI (lower urinary tract infection) 04/18/2014  . Hypoxia 04/18/2014  . PNA (pneumonia) 04/18/2014  . Chronic diastolic congestive heart failure ([place]) 02/01/2013  . Pericardial effusion 02/01/2013  . Bradycardia 01/31/2013  . Empyema lung ([place]) 12/23/2012  . S/P thoracotomy 12/23/2012  . Abnormality of gait 12/05/2012  . Hypernatremia 08/13/2012  . Hypertension   . Diabetes mellitus, type 2 ([place])   . Arthritis   . Bipolar disorder ([place])   . Anxiety   . Chronic indwelling Foley catheter   . Neuromuscular disorder ([place])   . Bacteremia of undetermined etiology 06/17/2012  . Leukocytosis 04/17/2012  . Parkinson disease ([place]) 04/16/2012  . Elevated troponin 04/16/2012  . Hearing aid worn   . Diabetes mellitus ([place]) 07/06/2011    Past Surgical History:  Procedure Laterality Date  . TOTAL HIP ARTHROPLASTY    . VIDEO ASSISTED THORACOSCOPY (VATS)/EMPYEMA  12/16/2012   Procedure: VIDEO ASSISTED THORACOSCOPY (VATS)/EMPYEMA;  Surgeon: Melrose Nakayama, MD;  Location: [facility];  Service: Thoracic;  Laterality: Right;  Marland Kitchen VIDEO BRONCHOSCOPY  12/16/2012   Procedure: VIDEO BRONCHOSCOPY;  Surgeon: Melrose Nakayama, MD;  Location: [facility];  Service: Thoracic;  Laterality: Right;        Home Medications    Prior to Admission medications   Medication Sig Start Date End  Date Taking? Authorizing Provider  acetaminophen (TYLENOL) 325 MG tablet Take 650 mg by mouth every 6 (six) hours as needed (pain). Not to exceed 8 tabs per 24hrs.   Yes [provider]  albuterol (PROVENTIL HFA;VENTOLIN HFA) 108 (90 Base) MCG/ACT inhaler Inhale 1-2 puffs into the lungs every 6 (six) hours as needed for wheezing or shortness of breath. Patient taking differently: Inhale 2 puffs into the lungs every 6 (six) hours  as needed for wheezing or shortness of breath.  01/16/18  Yes Kinnie Feil, PA-C  aspirin 81 MG chewable tablet Chew 81 mg by mouth daily.   Yes [provider]  carbidopa-levodopa (SINEMET IR) 25-250 MG per tablet Take 1 tablet by mouth 3 (three) times daily. At 7am, 2pm, 7pm.   Yes [provider]  clonazePAM (KLONOPIN) 0.5 MG tablet Take 0.5 tablets (0.25 mg total) by mouth 2 (two) times daily as needed (anxiety). 08/08/17  Yes Mariel Aloe, MD  clopidogrel (PLAVIX) 75 MG tablet Take 1 tablet (75 mg total) by mouth daily. 10/07/15  Yes Florencia Reasons, MD  entacapone (COMTAN) 200 MG tablet Take 200 mg by mouth 3 (three) times daily. At 7am 2pm, 7pm.   Yes [provider]  feeding supplement, GLUCERNA SHAKE, (GLUCERNA SHAKE) LIQD Take 237 mLs by mouth 2 (two) times daily between meals. 03/16/16  Yes Kelvin Cellar, MD  ferrous sulfate 325 (65 FE) MG tablet Take 325 mg by mouth 2 (two) times daily.   Yes [provider]  isosorbide mononitrate (IMDUR) 30 MG 24 hr tablet Take 1 tablet (30 mg total) by mouth daily. 10/07/15  Yes Florencia Reasons, MD  lamoTRIgine (LAMICTAL) 150 MG tablet Take 1 tablet (150 mg total) by mouth 2 (two) times daily. 07/17/17  Yes Arfeen, Arlyce Harman, MD  Multiple Vitamin (MULTIVITAMIN WITH MINERALS) TABS tablet Take 1 tablet by mouth daily.   Yes [provider]  nystatin (NYSTATIN) powder Apply 1 g topically 2 (two) times daily. Apply to groin for redness   Yes [provider]  pantoprazole (PROTONIX) 40 MG tablet Take 40 mg by mouth daily. Reported on 07/06/2016   Yes [provider]  polyethylene glycol (MIRALAX / GLYCOLAX) packet Take 17 g by mouth daily.   Yes [provider]  predniSONE (DELTASONE) 2.5 MG tablet Take 2.5 mg by mouth daily with breakfast.   Yes [provider]  risperiDONE (RISPERDAL) 0.25 MG tablet Take 0.25 mg by mouth 3 (three) times daily.   Yes [provider]    rosuvastatin (CRESTOR) 20 MG tablet Take 20 mg by mouth daily.   Yes [provider]  sertraline (ZOLOFT) 100 MG tablet Take 2 tablets (200 mg total) by mouth daily. Patient taking differently: Take 300 mg by mouth daily.  07/17/17  Yes Arfeen, Arlyce Harman, MD  traZODone (DESYREL) 50 MG tablet Take 1 tablet (50 mg total) by mouth at bedtime. 01/17/17  Yes Arfeen, Arlyce Harman, MD    Family History Family History  Problem Relation Age of Onset  . Depression Father   . Cancer Mother   . Heart disease Mother     Social History Social History   Tobacco Use  . Smoking status: Never Smoker  . Smokeless tobacco: Never Used  Substance Use Topics  . Alcohol use: No    Alcohol/week: 0.0 oz    Comment: occasional  . Drug use: No     Allergies   Plant sterols and stanols   Review of  Systems Review of Systems  Unable to perform ROS: Dementia     Physical Exam Updated Vital Signs BP (!) 173/79 (BP Location: Right Arm)   Pulse 88   Temp 99.1 F (37.3 C) (Oral)   Resp 19   SpO2 99%   Physical Exam  Constitutional: He appears well-developed.  Elderly, frail  HENT:  Head: Normocephalic and atraumatic.  Right Ear: External ear normal.  Left Ear: External ear normal.  Eyes: Pupils are equal, round, and reactive to light. Conjunctivae and EOM are normal.  Neck: Normal range of motion and phonation normal. Neck supple.  Cardiovascular: Normal rate, regular rhythm and normal heart sounds.  Pulmonary/Chest: Effort normal and breath sounds normal. No respiratory distress. He exhibits no bony tenderness.  Abdominal: Soft. There is no tenderness. There is no guarding.  Musculoskeletal:  No large joint deformity.  Minimal cooperation for request to move extremities.  Neurological: He is alert. No cranial nerve deficit. He exhibits normal muscle tone.  Skin: Skin is warm, dry and intact. No rash noted.  Psychiatric:  He appears suspicious.  He is moderately anxious.  Nursing note  and vitals reviewed.    ED Treatments / Results  Labs (all labs ordered are listed, but only abnormal results are displayed) Labs Reviewed  BASIC METABOLIC PANEL  CBC WITH DIFFERENTIAL/PLATELET  URINALYSIS, ROUTINE W REFLEX MICROSCOPIC    EKG None  Radiology No results found.  Procedures Procedures (including critical care time)  Medications Ordered in ED Medications - No data to display   Initial Impression / Assessment and Plan / ED Course  I have reviewed the triage vital signs and the nursing notes.  Pertinent labs & imaging results that were available during my care of the patient were reviewed by me and considered in my medical decision making (see chart for details).  Clinical Course as of Mar 21 1538  Thu Mar 21, 2018  1450 I was able to contact his power of attorney, Orvis Brill, phone # [phone] , she reports that personnel at his nursing home told her that he was "pale and not acting himself."  [name] states that the patient is frequently worried about various illnesses.  She is his only contact, and he "has no family."She would like to be recontacted after the evaluation today.   [EW]  0175 Ancillary testing ordered to evaluate for causes of worsening aggressive behavior.  CT scan to rule out stroke or intracranial abnormality, blood work for evaluation of possible infection or metabolic process, urinalysis for possible infection.   [EW]    Clinical Course User Index [EW] Daleen Bo, MD     Patient Vitals for the past 24 hrs:  BP Temp Temp src Pulse Resp SpO2  03/21/18 1335 (!) 173/79 99.1 F (37.3 C) Oral 88 19 99 %    3:38 PM Reevaluation with update and discussion. After initial assessment and treatment, an updated evaluation reveals no change in clinical status. Daleen Bo   Nursing Notes Reviewed/ Care Coordinated Applicable Imaging Reviewed Interpretation of Laboratory Data incorporated into ED treatment  Care to oncoming  provider team to evaluate after testing to term and appropriate disposition.  Patient's power of attorney, [name] Heuskin, would like to be contacted, at the time of disposition.  Final Clinical Impressions(s) / ED Diagnoses   Final diagnoses:  None    ED Discharge Orders    None       Daleen Bo, MD 03/21/18 1541

## 2018-03-21 NOTE — Progress Notes (Signed)
Patient arrived to room 1520 from ED. Patient is A&Ox2. Patient has right FA SL in place, site WNL. Patient has redness to scrotum, stage 2 sacral wound noted. Cleansed area and applied sacral foam dressing. Patient has dry skin to lower ext, bruises to left flank and abrasion to right knee. Patient has wet sounding cough but not actually producing much sputum. States had cough for awhile "but doesn't have a cold". Patient has indwelling foley catheter with yellow urine. Has generalized edema to lower ext noted. Patient oriented to room, call bell, bed use. Has DNR in place and DNR bracelet placed. Will c/t monitor. Bed alarm placed and fall mats at bedside.

## 2018-03-21 NOTE — ED Notes (Signed)
ED TO INPATIENT HANDOFF REPORT  Name/Age/Gender [name]. 83 y.o. male  Code Status    Code Status Orders  (From admission, onward)        Start     Ordered   03/21/18 2029  Do not attempt resuscitation (DNR)  Continuous    Question Answer Comment  In the event of cardiac or respiratory ARREST Do not call a "code blue"   In the event of cardiac or respiratory ARREST Do not perform Intubation, CPR, defibrillation or ACLS   In the event of cardiac or respiratory ARREST Use medication by any route, position, wound care, and other measures to relive pain and suffering. May use oxygen, suction and manual treatment of airway obstruction as needed for comfort.      03/21/18 2028    Code Status History    Date Active Date Inactive Code Status Order ID Comments User Context   08/05/2017 1936 08/08/2017 2114 DNR [id number]  Norval Morton, MD ED   11/29/2016 2239 12/02/2016 2209 DNR [id number]  Etta Quill, DO ED   03/14/2016 2050 03/16/2016 1659 DNR [id number]  Edwin Dada, MD Inpatient   10/05/2015 2024 10/07/2015 1656 DNR [id number]  Hosie Poisson, MD ED   09/30/2015 1137 10/04/2015 2016 DNR [id number]  Charlynne Cousins, MD Inpatient   09/30/2015 0731 09/30/2015 1137 DNR [id number]  Gareth Morgan, MD ED   03/16/2015 0814 03/22/2015 1813 DNR [id number]  Melton Alar, PA-C Inpatient   09/10/2014 2001 09/11/2014 0007 Full Code [id number]  Hyman Bible, PA-C ED   04/18/2014 0555 04/21/2014 1753 Full Code [id number]  Toy Baker, MD Inpatient   01/31/2013 1647 02/03/2013 1745 Full Code [id number]  Bonnielee Haff, MD ED   12/16/2012 2040 12/24/2012 1717 Full Code [id number]  Billie Ruddy, RN Inpatient   12/10/2012 2140 12/16/2012 2040 Full Code [id number]  Malka So, RN ED   09/07/2012 0522 09/09/2012 2136 Full Code [id number]  Peggye Pitt, RN Inpatient   06/12/2012 2329 06/19/2012 1531 Full Code [id number]  Phillis Knack, RN Inpatient   04/16/2012 1443  04/21/2012 1651 Full Code [id number]  Afatsawo, Karlyn Agee, RN Inpatient    Advance Directive Documentation     Most Recent Value  Type of Advance Directive  Out of facility DNR (pink MOST or yellow form)  Pre-existing out of facility DNR order (yellow form or pink MOST form)  Yellow form placed in chart (order not valid for inpatient use)  "MOST" Form in Place?  -      Home/SNF/Other Home  Chief Complaint AMS   Level of Care/Admitting Diagnosis ED Disposition    ED Disposition Condition [place]: [facility] [100102]  Level of Care: Med-Surg [16]  Diagnosis: Catheter-associated urinary tract infection [facility]) [021117]  Admitting Physician: Doreatha Massed  Attending Physician: Etta Quill [phone]  Estimated length of stay: past midnight tomorrow  Certification:: I certify this patient will need inpatient services for at least 2 midnights  PT Class (Do Not Modify): Inpatient [101]  PT Acc Code (Do Not Modify): Private [1]       Medical History Past Medical History:  Diagnosis Date  . Anxiety   . Arthritis   . Benign prostate hyperplasia   . Bipolar disorder ([place])   . C2 cervical fracture ([name])    due to pt fall  . Chronic indwelling Foley catheter   . Depression   . Diabetes mellitus type  II   . Falls   . Flu   . Hearing aid worn   . Hypertension   . Memory difficulty 08/21/2014  . Memory loss   . MRSA infection (methicillin-resistant Staphylococcus aureus)   . Neuromuscular disorder ([place])    parkinsons  . Parkinson disease ([place])   . Pneumonia   . SIRS (systemic inflammatory response syndrome) ([name])   . UTI (urinary tract infection)     Allergies Allergies  Allergen Reactions  . Plant Sterols And Stanols Other (See Comments)    ON MAR    IV Location/Drains/Wounds Patient Lines/Drains/Airways Status   Active Line/Drains/Airways    Name:   Placement date:   Placement time:   Site:   Days:   Peripheral  IV 03/21/18 Left Forearm   03/21/18    2021    Forearm   less than 1   Urethral Catheter Latex   11/30/16    0104    Latex   476   Urethral Catheter chelsea rn Triple-lumen 24 Fr.   08/05/17    1715    Triple-lumen   228   Urethral Catheter Lisseth Brazeau, RN Double-lumen 16 Fr.   03/21/18    1842    Double-lumen   less than 1   Pressure Ulcer 03/15/16 Stage I -  Intact skin with non-blanchable redness of a localized area usually over a bony prominence.   03/15/16    1100     736   Pressure Injury 11/30/16 Stage II -  Partial thickness loss of dermis presenting as a shallow open ulcer with a red, pink wound bed without slough.   11/30/16    0106     476   Pressure Injury 08/05/17 Stage II -  Partial thickness loss of dermis presenting as a shallow open ulcer with a red, pink wound bed without slough. Red unblanchable partial thickness loss of dermis   08/05/17    1000     228   Wound / Incision (Open or Dehisced) 11/30/16 Puncture Head Posterior   11/30/16    0312    Head   476   Wound / Incision (Open or Dehisced) 11/30/16 Non-pressure wound Head Anterior   11/30/16    0313    Head   476   Wound / Incision (Open or Dehisced) 08/21/17 Other (Comment) Scrotum Left;Right;Posterior REDNESS, TENDERNESS,   08/21/17    1553    Scrotum   212          Labs/Imaging Results for orders placed or performed during the hospital encounter of 03/21/18 (from the past 48 hour(s))  Basic metabolic panel     Status: Abnormal   Collection Time: 03/21/18  3:45 PM  Result Value Ref Range   Sodium 139 135 - 145 mmol/L   Potassium 4.4 3.5 - 5.1 mmol/L   Chloride 103 101 - 111 mmol/L   CO2 24 22 - 32 mmol/L   Glucose, Bld 108 (H) 65 - 99 mg/dL   BUN 24 (H) 6 - 20 mg/dL   Creatinine, Ser 0.95 0.61 - 1.24 mg/dL   Calcium 9.4 8.9 - 10.3 mg/dL   GFR calc non Af Amer >60 >60 mL/min   GFR calc Af Amer >60 >60 mL/min    Comment: (NOTE) The eGFR has been calculated using the CKD EPI equation. This calculation has not been  validated in all clinical situations. eGFR's persistently <60 mL/min signify possible Chronic Kidney Disease.    Anion gap 12 5 -  15    Comment: Performed at [facility], [place] [address], [place], [place] [address]  CBC with Differential     Status: Abnormal   Collection Time: 03/21/18  3:45 PM  Result Value Ref Range   WBC 20.5 (H) 4.0 - 10.5 K/uL   RBC 4.67 4.22 - 5.81 MIL/uL   Hemoglobin 14.2 13.0 - 17.0 g/dL   HCT 42.3 39.0 - 52.0 %   MCV 90.6 78.0 - 100.0 fL   MCH 30.4 26.0 - 34.0 pg   MCHC 33.6 30.0 - 36.0 g/dL   RDW 14.8 11.5 - 15.5 %   Platelets 185 150 - 400 K/uL   Neutrophils Relative % 93 %   Neutro Abs 18.8 (H) 1.7 - 7.7 K/uL   Lymphocytes Relative 2 %   Lymphs Abs 0.5 (L) 0.7 - 4.0 K/uL   Monocytes Relative 5 %   Monocytes Absolute 1.1 (H) 0.1 - 1.0 K/uL   Eosinophils Relative 0 %   Eosinophils Absolute 0.1 0.0 - 0.7 K/uL   Basophils Relative 0 %   Basophils Absolute 0.0 0.0 - 0.1 K/uL    Comment: Performed at [facility], [place] [address], [place], [place] [address]  Urinalysis, Routine w reflex microscopic     Status: Abnormal   Collection Time: 03/21/18  5:09 PM  Result Value Ref Range   Color, Urine YELLOW YELLOW   APPearance HAZY (A) CLEAR   Specific Gravity, Urine 1.015 1.005 - 1.030   pH 6.0 5.0 - 8.0   Glucose, UA NEGATIVE NEGATIVE mg/dL   Hgb urine dipstick MODERATE (A) NEGATIVE   Bilirubin Urine NEGATIVE NEGATIVE   Ketones, ur 5 (A) NEGATIVE mg/dL   Protein, ur NEGATIVE NEGATIVE mg/dL   Nitrite POSITIVE (A) NEGATIVE   Leukocytes, UA LARGE (A) NEGATIVE   RBC / HPF 0-5 0 - 5 RBC/hpf   WBC, UA 6-30 0 - 5 WBC/hpf   Bacteria, UA FEW (A) NONE SEEN   Squamous Epithelial / LPF 0-5 (A) NONE SEEN   Mucus PRESENT    Amorphous Crystal PRESENT     Comment: Performed at [facility], [place] [address] [place], [place] [address]  Urinalysis, Routine w reflex microscopic     Status: Abnormal   Collection  Time: 03/21/18  7:10 PM  Result Value Ref Range   Color, Urine YELLOW YELLOW   APPearance HAZY (A) CLEAR   Specific Gravity, Urine 1.016 1.005 - 1.030   pH 6.0 5.0 - 8.0   Glucose, UA NEGATIVE NEGATIVE mg/dL   Hgb urine dipstick LARGE (A) NEGATIVE   Bilirubin Urine NEGATIVE NEGATIVE   Ketones, ur NEGATIVE NEGATIVE mg/dL   Protein, ur 30 (A) NEGATIVE mg/dL   Nitrite NEGATIVE NEGATIVE   Leukocytes, UA LARGE (A) NEGATIVE   RBC / HPF TOO NUMEROUS TO COUNT 0 - 5 RBC/hpf   WBC, UA TOO NUMEROUS TO COUNT 0 - 5 WBC/hpf   Bacteria, UA FEW (A) NONE SEEN   Squamous Epithelial / LPF NONE SEEN NONE SEEN   WBC Clumps PRESENT    Mucus PRESENT     Comment: Performed at [facility], [place] [address], [place], [place] [address]   Dg Chest 2 View  Result Date: 03/21/2018 CLINICAL DATA:  Altered mental status.  Aggression. EXAM: CHEST - 2 VIEW COMPARISON:  01/16/2018 FINDINGS: Stable mild cardiomegaly. Aortic atherosclerosis. Low lung volumes again seen. Mild scarring in the right midlung is stable. No evidence of pulmonary infiltrate  or edema. No evidence of pneumothorax or pleural effusion. Multiple old left rib fracture deformities and severe bilateral glenohumeral osteoarthritis again noted. IMPRESSION: Stable mild cardiomegaly and mild right lung scarring. No active lung disease. Electronically Signed   By: Earle Gell M.D.   On: 03/21/2018 19:06   Ct Head Wo Contrast  Result Date: 03/21/2018 CLINICAL DATA:  Altered mental status. EXAM: CT HEAD WITHOUT CONTRAST TECHNIQUE: Contiguous axial images were obtained from the base of the skull through the vertex without intravenous contrast. COMPARISON:  CT head dated August 06, 2017. FINDINGS: Brain: No evidence of acute infarction, hemorrhage, hydrocephalus, extra-axial collection or mass lesion/mass effect. Stable atrophy and chronic microvascular ischemic changes. Unchanged posterior fossa arachnoid cyst. Vascular: Calcified atherosclerosis at  the skullbase. No hyperdense vessel. Skull: Normal. Negative for fracture or focal lesion. Sinuses/Orbits: Tiny air-fluid level in the left sphenoid sinus. Remaining paranasal sinuses are clear. The mastoid air cells are underdeveloped. No acute orbital abnormality. Other: None. IMPRESSION: 1.  No acute intracranial abnormality. Electronically Signed   By: Titus Dubin M.D.   On: 03/21/2018 16:34    Pending Labs Unresulted Labs (From admission, onward)   Start     Ordered   03/22/18 0500  CBC  Tomorrow morning,   R     03/21/18 2028   03/22/18 7062  Basic metabolic panel  Tomorrow morning,   R     03/21/18 2028   03/21/18 1907  Urine culture  STAT,   STAT    Question:  Patient immune status  Answer:  Normal   03/21/18 1906      Vitals/Pain Today's Vitals   03/21/18 1335 03/21/18 1739 03/21/18 1925  BP: (!) 173/79 (!) 133/106 128/76  Pulse: 88 86 78  Resp: '19 20 20  ' Temp: 99.1 F (37.3 C)    TempSrc: Oral    SpO2: 99% 98% 95%    Isolation Precautions No active isolations  Medications Medications  acetaminophen (TYLENOL) tablet 650 mg (has no administration in time range)    Or  acetaminophen (TYLENOL) suppository 650 mg (has no administration in time range)  ondansetron (ZOFRAN) tablet 4 mg (has no administration in time range)    Or  ondansetron (ZOFRAN) injection 4 mg (has no administration in time range)  enoxaparin (LOVENOX) injection 40 mg (has no administration in time range)  piperacillin-tazobactam (ZOSYN) IVPB 3.375 g (has no administration in time range)  piperacillin-tazobactam (ZOSYN) IVPB 3.375 g (3.375 g Intravenous New Bag/Given 03/21/18 2057)  acetaminophen (TYLENOL) tablet 650 mg (has no administration in time range)  albuterol (PROVENTIL HFA;VENTOLIN HFA) 108 (90 Base) MCG/ACT inhaler 2 puff (has no administration in time range)  aspirin chewable tablet 81 mg (has no administration in time range)  carbidopa-levodopa (SINEMET IR) 25-250 MG per tablet  immediate release 1 tablet (has no administration in time range)  clonazePAM (KLONOPIN) tablet 0.25 mg (has no administration in time range)  clopidogrel (PLAVIX) tablet 75 mg (has no administration in time range)  entacapone (COMTAN) tablet 200 mg (has no administration in time range)  feeding supplement (GLUCERNA SHAKE) (GLUCERNA SHAKE) liquid 237 mL (has no administration in time range)  ferrous sulfate tablet 325 mg (has no administration in time range)  isosorbide mononitrate (IMDUR) 24 hr tablet 30 mg (has no administration in time range)  pantoprazole (PROTONIX) EC tablet 40 mg (has no administration in time range)  predniSONE (DELTASONE) tablet 2.5 mg (has no administration in time range)  risperiDONE (RISPERDAL) tablet 0.25 mg (has no  administration in time range)  polyethylene glycol (MIRALAX / GLYCOLAX) packet 17 g (has no administration in time range)  multivitamin with minerals tablet 1 tablet (has no administration in time range)  lamoTRIgine (LAMICTAL) tablet 150 mg (has no administration in time range)  nystatin (MYCOSTATIN/NYSTOP) topical powder 1 g (has no administration in time range)  sertraline (ZOLOFT) tablet 300 mg (has no administration in time range)  traZODone (DESYREL) tablet 50 mg (has no administration in time range)  rosuvastatin (CRESTOR) tablet 20 mg (has no administration in time range)    Mobility walks with device

## 2018-03-22 LAB — BASIC METABOLIC PANEL
ANION GAP: 9 (ref 5–15)
BUN: 22 mg/dL — ABNORMAL HIGH (ref 6–20)
CHLORIDE: 101 mmol/L (ref 101–111)
CO2: 26 mmol/L (ref 22–32)
CREATININE: 1.05 mg/dL (ref 0.61–1.24)
Calcium: 8.8 mg/dL — ABNORMAL LOW (ref 8.9–10.3)
GFR calc non Af Amer: >60 mL/min (ref >60)
Glucose, Bld: 110 mg/dL — ABNORMAL HIGH (ref 65–99)
POTASSIUM: 4.3 mmol/L (ref 3.5–5.1)
Sodium: 136 mmol/L (ref 135–145)

## 2018-03-22 LAB — CBC
HEMATOCRIT: 38.2 % — AB (ref 39.0–52.0)
HEMOGLOBIN: 12.4 g/dL — AB (ref 13.0–17.0)
MCH: 29.8 pg (ref 26.0–34.0)
MCHC: 32.5 g/dL (ref 30.0–36.0)
MCV: 91.8 fL (ref 78.0–100.0)
Platelets: 186 10*3/uL (ref 150–400)
RBC: 4.16 MIL/uL — AB (ref 4.22–5.81)
RDW: 15.1 % (ref 11.5–15.5)
WBC: 17.2 10*3/uL — ABNORMAL HIGH (ref 4.0–10.5)

## 2018-03-22 LAB — MRSA PCR SCREENING: MRSA by PCR: POSITIVE — AB

## 2018-03-22 MED ORDER — CHLORHEXIDINE GLUCONATE CLOTH 2 % EX PADS
6.0000 | MEDICATED_PAD | Freq: Every day | CUTANEOUS | Status: DC
  Administered 2018-03-22 – 2018-03-23 (×2): 6 via TOPICAL

## 2018-03-22 MED ORDER — MUPIROCIN 2 % EX OINT
1.0000 "application " | TOPICAL_OINTMENT | Freq: Two times a day (BID) | CUTANEOUS | Status: DC
  Administered 2018-03-22 – 2018-03-23 (×4): 1 via NASAL
  Filled 2018-03-22: qty 22

## 2018-03-22 NOTE — ED Provider Notes (Signed)
I received pt in signout from Dr. Eulis Foster. He had sent to ED for evaluation of worsening mental status, worsening aggression.  I spoke with his nursing facility and they stated that he has been declining recently and he usually gets like this when he has a urinary tract infection.  The patient himself denies any complaints for me.  Labs notable for WBC 20.5.  Normal creatinine.  Chest x-ray is negative.  Given he has an indwelling Foley catheter, he is at increased risk of UTI and has extensive UTI history.  His urine today does suggest infection.  Foley catheter was exchanged in the ED.  I discussed his case with pharmacist given multiple positive urine cultures w/ resistance previously. He recommended Zosyn. Discussed admission w/ hospitalist, Dr. Alcario Drought.  I attempted to contact his power of attorney with no answer on the phone.   Venancio Chenier, Wenda Overland, MD 03/22/18 [phone]

## 2018-03-22 NOTE — Progress Notes (Signed)
Patient from [facility] facility.   LCSW will follow for disposition.  [name]

## 2018-03-22 NOTE — Progress Notes (Signed)
PROGRESS NOTE    [name].  YSA:[id number] DOB: 06-28-1935 DOA: 03/21/2018 PCP: Jilda Panda, MD   Brief Narrative: Patient is a 83 year old male with past medical history of Parkinson's dementia, chronic indwelling Foley catheter most likely secondary to neurogenic bladder, urinary tract infection,aggressive behavior who was sent from the assisted living facility to the emergency department for the evaluation of increased aggressive behavior altered mental status that is getting worse from couple of days.  On reviewing his records, patient has numerous emergency visits.  Patient has chronic indwelling Foley catheter and history of UTI with Pseudomonas.  Patient does not have insight on baseline and although information was taken from the records and her healthcare power of attorney. Patient was admitted for the management of urinary tract infection.  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 I asked the healthcare power of attorney about the real reason for chronic indwelling Foley catheter and she just says she does not know and may be because he cannot void by himself. Patient has history of UTI secondary to Pseudomonas and enterococcus in the past.  Apparently Pseudomonas was resistant to Ciprofloxacin. Patient's UA was positive of urinary tract infection.  He also presented with leukocytosis.  Started on Zosyn.  Urine culture has been sent. Needs to be on IV Zosyn until final culture and sensitivities back. Patient is afebrile.  Parkinson's dementia with behavioral disturbance: Reported to have worsening of agitation apparently  due to a urinary tract infection.  He has history of aggressive behavior.  He just says he wants to go home.  Patient does not have insight and cannot make decision.  We will continue his home meds.  Currently he looks comfortable, not agitated during my evaluation this morning.  Hypertension: Currently his blood pressure stable.  Continue his home meds.  We will continue to monitor his blood pressure.  Bipolar disorder : Continue his home medications.  History of aggressive behavior.  Leukocytosis: Most likely acid with urinary tract infection we will continue to monitor the trend  DVT prophylaxis: Lovenox Code Status: DNR Family Communication: Discussed with healthcare power of attorney on phone Disposition Plan: Back to assisted living facility after urine culture sensitivity report   Consultants: None  Procedures:None  Antimicrobials: Zosyn since 03/21/18  Subjective: Patient seen and examined the bedside this morning.  Remains comfortable.  Eating his breakfast.  No overnight fever, nausea or vomiting.  Denies any dysuria.  He says he wants to go home and does not understand why he is here.  Objective: Vitals:   03/21/18 1925 03/21/18 2137 03/21/18 2303 03/22/18 0439  BP: 128/76 (!) 155/60  127/63  Pulse: 78 69  72  Resp: 20 20  19   Temp:  98.1 F (36.7 C)  98.5 F (36.9 C)  TempSrc:  Oral  Oral  SpO2: 95% 98%  93%  Weight:   78.8 kg (173 lb 11.6 oz)   Height:   5\' 6"  (1.676 m)     Intake/Output Summary (Last 24 hours) at 03/22/2018 1335 Last data filed at 03/22/2018 0626 Gross per 24 hour  Intake 160 ml  Output 825 ml  Net -665 ml  Filed Weights   03/21/18 2303  Weight: 78.8 kg (173 lb 11.6 oz)    Examination:  General exam: Appears calm and comfortable ,Not in distress,average built HEENT:PERRL,Oral mucosa moist, Ear/Nose normal on gross exam Respiratory system: Bilateral equal air entry, normal vesicular breath sounds, no wheezes or crackles  Cardiovascular  system: S1 & S2 heard, RRR. No JVD, murmurs, rubs, gallops or clicks. No pedal edema. Gastrointestinal system: Abdomen is nondistended, soft and nontender. No organomegaly or masses felt. Normal bowel sounds heard. Central nervous system: Alert and awake . No focal neurological deficits. Extremities: No edema, no clubbing ,no cyanosis, distal peripheral pulses palpable. Skin: No rashes, lesions or ulcers,no icterus ,no pallor MSK: Normal muscle bulk,tone ,power Psychiatry: Judgement and insight appear impaired GU: Foley     Data Reviewed: I have personally reviewed following labs and imaging studies  CBC: Recent Labs  Lab 03/21/18 1545 03/22/18 0545  WBC 20.5* 17.2*  NEUTROABS 18.8*  --   HGB 14.2 12.4*  HCT 42.3 38.2*  MCV 90.6 91.8  PLT 185 329   Basic Metabolic Panel: Recent Labs  Lab 03/21/18 1545 03/22/18 0545  NA 139 136  K 4.4 4.3  CL 103 101  CO2 24 26  GLUCOSE 108* 110*  BUN 24* 22*  CREATININE 0.95 1.05  CALCIUM 9.4 8.8*   GFR: Estimated Creatinine Clearance: 54.5 mL/min (by C-G formula based on SCr of 1.05 mg/dL). Liver Function Tests: No results for input(s): AST, ALT, ALKPHOS, BILITOT, PROT, ALBUMIN in the last 168 hours. No results for input(s): LIPASE, AMYLASE in the last 168 hours. No results for input(s): AMMONIA in the last 168 hours. Coagulation Profile: No results for input(s): INR, PROTIME in the last 168 hours. Cardiac Enzymes: No results for input(s): CKTOTAL, CKMB, CKMBINDEX, TROPONINI in the last 168 hours. BNP (last 3 results) No results for input(s): PROBNP in the last 8760 hours. HbA1C: No results for input(s): HGBA1C in the last 72 hours. CBG: No results for input(s): GLUCAP in the last 168 hours. Lipid Profile: No results for input(s): CHOL, HDL, LDLCALC, TRIG, CHOLHDL, LDLDIRECT in the last 72 hours. Thyroid Function Tests: No results for input(s): TSH, T4TOTAL, FREET4, T3FREE, THYROIDAB in the last 72 hours. Anemia Panel: No  results for input(s): VITAMINB12, FOLATE, FERRITIN, TIBC, IRON, RETICCTPCT in the last 72 hours. Sepsis Labs: No results for input(s): PROCALCITON, LATICACIDVEN in the last 168 hours.  Recent Results (from the past 240 hour(s))  MRSA PCR Screening     Status: Abnormal   Collection Time: 03/21/18 10:05 PM  Result Value Ref Range Status   MRSA by PCR POSITIVE (A) NEGATIVE Final    Comment:        The GeneXpert MRSA Assay (FDA approved for NASAL specimens only), is one component of a comprehensive MRSA colonization surveillance program. It is not intended to diagnose MRSA infection nor to guide or monitor treatment for MRSA infections. RESULT CALLED TO, READ BACK BY AND VERIFIED WITH: L WILLIAMS,RN @0044  03/22/18 MKELLY Performed at [facility], [place] [address], [place], [place] [address]          Radiology Studies: Dg Chest 2 View  Result Date: 03/21/2018 CLINICAL DATA:  Altered mental status.  Aggression. EXAM: CHEST - 2 VIEW COMPARISON:  01/16/2018 FINDINGS: Stable mild cardiomegaly. Aortic atherosclerosis. Low lung volumes again seen. Mild scarring in the right midlung is stable. No evidence of pulmonary infiltrate or edema. No evidence of pneumothorax or pleural effusion. Multiple old left rib fracture  deformities and severe bilateral glenohumeral osteoarthritis again noted. IMPRESSION: Stable mild cardiomegaly and mild right lung scarring. No active lung disease. Electronically Signed   By: Earle Gell M.D.   On: 03/21/2018 19:06   Ct Head Wo Contrast  Result Date: 03/21/2018 CLINICAL DATA:  Altered mental status. EXAM: CT HEAD WITHOUT CONTRAST TECHNIQUE: Contiguous axial images were obtained from the base of the skull through the vertex without intravenous contrast. COMPARISON:  CT head dated August 06, 2017. FINDINGS: Brain: No evidence of acute infarction, hemorrhage, hydrocephalus, extra-axial collection or mass lesion/mass effect. Stable atrophy and  chronic microvascular ischemic changes. Unchanged posterior fossa arachnoid cyst. Vascular: Calcified atherosclerosis at the skullbase. No hyperdense vessel. Skull: Normal. Negative for fracture or focal lesion. Sinuses/Orbits: Tiny air-fluid level in the left sphenoid sinus. Remaining paranasal sinuses are clear. The mastoid air cells are underdeveloped. No acute orbital abnormality. Other: None. IMPRESSION: 1.  No acute intracranial abnormality. Electronically Signed   By: Titus Dubin M.D.   On: 03/21/2018 16:34        Scheduled Meds: . aspirin  81 mg Oral Daily  . carbidopa-levodopa  1 tablet Oral 3 times per day  . Chlorhexidine Gluconate Cloth  6 each Topical Q0600  . clopidogrel  75 mg Oral Daily  . enoxaparin (LOVENOX) injection  40 mg Subcutaneous Q24H  . entacapone  200 mg Oral 3 times per day  . feeding supplement (GLUCERNA SHAKE)  237 mL Oral BID BM  . ferrous sulfate  325 mg Oral BID  . isosorbide mononitrate  30 mg Oral Daily  . lamoTRIgine  150 mg Oral BID  . multivitamin with minerals  1 tablet Oral Daily  . mupirocin ointment  1 application Nasal BID  . nystatin  1 g Topical BID  . pantoprazole  40 mg Oral Daily  . polyethylene glycol  17 g Oral Daily  . predniSONE  2.5 mg Oral Q breakfast  . risperiDONE  0.25 mg Oral TID  . rosuvastatin  20 mg Oral Daily  . sertraline  300 mg Oral Daily  . traZODone  50 mg Oral QHS   Continuous Infusions: . piperacillin-tazobactam (ZOSYN)  IV Stopped (03/22/18 1026)     LOS: 1 day    Time spent:25 mins.   Please Note: This patient record was dictated using Editor, commissioning. Chart creation errors have been sought, but may not always have been located. Such creation errors do not reflect on the Standard of Medical Care.     Shelly Coss, MD [facility] Pager [phone]  If 7PM-7AM, please contact night-coverage [email or web address] Password [email or web address] 03/22/2018, 1:35 PM

## 2018-03-22 NOTE — Progress Notes (Signed)
Phone call from lab noting that patient is positive for MRSA. MRSA order set placed. Will c/t monitor.

## 2018-03-22 NOTE — Clinical Social Work Note (Signed)
Clinical Social Work Assessment  Patient Details  Name: [name]. MRN: [id number] Date of Birth: 14-Jun-1935  Date of referral:  03/22/18               Reason for consult:  Facility Placement                Permission sought to share information with:  Case Manager, Customer service manager, Family Supports Permission granted to share information::  No(Patient is not oriented, has HCPOA)  Name::        Agency::     Relationship::     Contact Information:     Housing/Transportation Living arrangements for the past 2 months:  [place] of Information:  Power of Attorney Patient Interpreter Needed:  None Criminal Activity/Legal Involvement Pertinent to Current Situation/Hospitalization:  No - Comment as needed Significant Relationships:  Warehouse manager, Other(Comment) Lives with:  Facility Resident Do you feel safe going back to the place where you live?  Yes Need for family participation in patient care:  Yes (Comment)  Care giving concerns:  No care giving concerns at the time of assessment.    Social Worker assessment / plan:  LCSW following for facility placement.  Patient from [name].  Patient admitted for UTI and aggressive behavior.   LCSW spoke with patient's [name] by phone.   According to [name], patient has been a resident at [facility] since Oct. 2018. He was previously at [facility]. [name] reports that patient is in a wheel chair at baseline. Patient needs assistance with all ADLs and transfers. Patient is able to feed himself.   Patient has a history of some aggressive behavior at baseline. [name] reports that patient had a bad experience at [facility] and now takes it out on staff at his new facility.   PLAN: Patient will return to [facility] at dc.   Employment status:  Retired Forensic scientist:  Medicare PT Recommendations:  Not assessed at this time [place] /  Referral to community resources:     Patient/Family's Response to care:  [name] is thankful for LCSW follow up.   Patient/Family's Understanding of and Emotional Response to Diagnosis, Current Treatment, and Prognosis:  Unable to access.   Emotional Assessment Appearance:  Appears stated age Attitude/Demeanor/Rapport:  Unable to Assess Affect (typically observed):  Unable to Assess Orientation:  Oriented to Self Alcohol / Substance use:  Not Applicable Psych involvement (Current and /or in the community):  No (Comment)  Discharge Needs  Concerns to be addressed:  No discharge needs identified Readmission within the last 30 days:  No Current discharge risk:  None Barriers to Discharge:  Continued Medical Work up   [employer], LCSW 03/22/2018, 10:40 AM

## 2018-03-23 LAB — CBC WITH DIFFERENTIAL/PLATELET
BASOS ABS: 0 10*3/uL (ref 0.0–0.1)
Basophils Relative: 0 %
Eosinophils Absolute: 0.4 10*3/uL (ref 0.0–0.7)
Eosinophils Relative: 4 %
HEMATOCRIT: 35.8 % — AB (ref 39.0–52.0)
Hemoglobin: 11.6 g/dL — ABNORMAL LOW (ref 13.0–17.0)
LYMPHS ABS: 1.4 10*3/uL (ref 0.7–4.0)
LYMPHS PCT: 13 %
MCH: 29.9 pg (ref 26.0–34.0)
MCHC: 32.4 g/dL (ref 30.0–36.0)
MCV: 92.3 fL (ref 78.0–100.0)
Monocytes Absolute: 0.7 10*3/uL (ref 0.1–1.0)
Monocytes Relative: 6 %
NEUTROS ABS: 7.8 10*3/uL — AB (ref 1.7–7.7)
Neutrophils Relative %: 77 %
PLATELETS: 183 10*3/uL (ref 150–400)
RBC: 3.88 MIL/uL — AB (ref 4.22–5.81)
RDW: 15.2 % (ref 11.5–15.5)
WBC: 10.2 10*3/uL (ref 4.0–10.5)

## 2018-03-23 LAB — URINE CULTURE: Culture: >=100000 — AB

## 2018-03-23 MED ORDER — CEFDINIR 300 MG PO CAPS: 300.0000 mg | ORAL_CAPSULE | Freq: Two times a day (BID) | ORAL | 0 refills | Status: AC

## 2018-03-23 NOTE — NC FL2 (Addendum)
[place] LEVEL OF CARE SCREENING TOOL     IDENTIFICATION  Patient Name: [name]. Birthdate: [birth year] Sex: male Admission Date (Current Location): 03/21/2018  [facility] and [place] Number:  Herbalist and Address:  [facility],  [place] [address], [place]      Provider Number: [id number]  Attending Physician Name and Address:  Cristy Folks, MD  Relative Name and Phone Number:       Current Level of Care: Hospital Recommended Level of Care: [place] Prior Approval Number:    Date Approved/Denied: 03/22/18 PASRR Number: [id number]  Discharge Plan: Other (Comment)(ALF)    Current Diagnoses: Patient Active Problem List   Diagnosis Date Noted  . Catheter-associated urinary tract infection ([place]) 03/21/2018  . Dementia due to Parkinson's disease with behavioral disturbance ([place]) 08/06/2017  . Pyelonephritis 08/06/2017  . Contusion of head 08/06/2017  . Pressure injury of skin 08/06/2017  . Complicated UTI (urinary tract infection) 08/05/2017  . CAP (community acquired pneumonia) 11/29/2016  . Bacteremia 03/14/2016  . HCAP (healthcare-associated pneumonia)   . Pressure ulcer 10/01/2015  . Right lower lobe pneumonia ([place]) 10/01/2015  . Sepsis due to Gram-negative organism with acute respiratory failure ([place]) 10/01/2015  . Acute renal failure ([place]) 10/01/2015  . Acute respiratory failure with hypoxia ([place]) 09/30/2015  . Influenza A (H1N1) 03/25/2015  . Hyperkalemia 03/25/2015  . Falls 03/16/2015  . Sacral pressure ulcer 03/16/2015  . Fever 03/16/2015  . Memory difficulty 08/21/2014  . Malnutrition of moderate degree ([place]) 04/19/2014  . UTI (lower urinary tract infection) 04/18/2014  . Hypoxia 04/18/2014  . PNA (pneumonia) 04/18/2014  . Chronic diastolic congestive heart failure ([place]) 02/01/2013  . Pericardial effusion 02/01/2013  . Bradycardia 01/31/2013  . Empyema lung ([place]) 12/23/2012  .  S/P thoracotomy 12/23/2012  . Abnormality of gait 12/05/2012  . Hypernatremia 08/13/2012  . Hypertension   . Diabetes mellitus, type 2 ([place])   . Arthritis   . Bipolar disorder ([place])   . Anxiety   . Chronic indwelling Foley catheter   . Neuromuscular disorder ([place])   . Bacteremia of undetermined etiology 06/17/2012  . Leukocytosis 04/17/2012  . Parkinson disease ([place]) 04/16/2012  . Elevated troponin 04/16/2012  . Hearing aid worn   . Diabetes mellitus ([place]) 07/06/2011    Orientation RESPIRATION BLADDER Height & Weight     Self  Normal Indwelling catheter Weight: 173 lb 11.6 oz (78.8 kg) Height:  5\' 6"  (167.6 cm)  BEHAVIORAL SYMPTOMS/MOOD NEUROLOGICAL BOWEL NUTRITION STATUS      Continent Diet(See dc summary)  AMBULATORY STATUS COMMUNICATION OF NEEDS Skin   Extensive Assist(Wheel Chair) Verbally PU Stage and Appropriate Care([place] stage II)                       Personal Care Assistance Level of Assistance  Bathing, Feeding, Dressing Bathing Assistance: Maximum assistance Feeding assistance: Independent Dressing Assistance: Maximum assistance     Functional Limitations Info  Sight, Hearing, Speech Sight Info: Adequate Hearing Info: Adequate Speech Info: Adequate    SPECIAL CARE FACTORS FREQUENCY                       Contractures Contractures Info: Not present    Additional Factors Info  Code Status, Allergies Code Status Info: DNR Allergies Info: Plant Sterols And Stanols           Current Medications (03/23/2018):  This is the current  hospital active medication list Current Facility-Administered Medications  Medication Dose Route Frequency Provider Last Rate Last Dose  . acetaminophen (TYLENOL) tablet 650 mg  650 mg Oral Q6H PRN Etta Quill, DO       Or  . acetaminophen (TYLENOL) suppository 650 mg  650 mg Rectal Q6H PRN Etta Quill, DO      . albuterol (PROVENTIL) (2.5 MG/3ML) 0.083% nebulizer solution 2.5 mg  2.5 mg Nebulization  Q6H PRN Etta Quill, DO      . aspirin chewable tablet 81 mg  81 mg Oral Daily Jennette Kettle M, DO   81 mg at 03/23/18 0902  . carbidopa-levodopa (SINEMET IR) 25-250 MG per tablet immediate release 1 tablet  1 tablet Oral 3 times per day Etta Quill, DO   1 tablet at 03/23/18 0631  . Chlorhexidine Gluconate Cloth 2 % PADS 6 each  6 each Topical Q0600 Etta Quill, DO   6 each at 03/23/18 [phone]  . clonazePAM (KLONOPIN) tablet 0.25 mg  0.25 mg Oral BID PRN Etta Quill, DO      . clopidogrel (PLAVIX) tablet 75 mg  75 mg Oral Daily Jennette Kettle M, DO   75 mg at 03/23/18 0902  . enoxaparin (LOVENOX) injection 40 mg  40 mg Subcutaneous Q24H Jennette Kettle M, DO   40 mg at 03/22/18 2135  . entacapone (COMTAN) tablet 200 mg  200 mg Oral 3 times per day Etta Quill, DO   200 mg at 03/23/18 0631  . feeding supplement (GLUCERNA SHAKE) (GLUCERNA SHAKE) liquid 237 mL  237 mL Oral BID BM Jennette Kettle M, DO   237 mL at 03/22/18 1356  . ferrous sulfate tablet 325 mg  325 mg Oral BID Etta Quill, DO   325 mg at 03/23/18 9518  . isosorbide mononitrate (IMDUR) 24 hr tablet 30 mg  30 mg Oral Daily Jennette Kettle M, DO   30 mg at 03/23/18 8416  . lamoTRIgine (LAMICTAL) tablet 150 mg  150 mg Oral BID Jennette Kettle M, DO   150 mg at 03/23/18 6063  . multivitamin with minerals tablet 1 tablet  1 tablet Oral Daily Jennette Kettle M, DO   1 tablet at 03/22/18 0915  . mupirocin ointment (BACTROBAN) 2 % 1 application  1 application Nasal BID Etta Quill, DO   1 application at 01/60/10 9323  . nystatin (MYCOSTATIN/NYSTOP) topical powder 1 g  1 g Topical BID Jennette Kettle M, DO   1 g at 03/23/18 5573  . ondansetron (ZOFRAN) tablet 4 mg  4 mg Oral Q6H PRN Etta Quill, DO       Or  . ondansetron [facility]) injection 4 mg  4 mg Intravenous Q6H PRN Etta Quill, DO      . pantoprazole (PROTONIX) EC tablet 40 mg  40 mg Oral Daily Jennette Kettle M, DO   40 mg at 03/23/18 0902  .  piperacillin-tazobactam (ZOSYN) IVPB 3.375 g  3.375 g Intravenous Q8H Adrian Saran, [place]   Stopped at 03/23/18 1034  . polyethylene glycol (MIRALAX / GLYCOLAX) packet 17 g  17 g Oral Daily Etta Quill, DO   17 g at 03/23/18 2202  . predniSONE (DELTASONE) tablet 2.5 mg  2.5 mg Oral Q breakfast Etta Quill, DO   2.5 mg at 03/23/18 0846  . risperiDONE (RISPERDAL) tablet 0.25 mg  0.25 mg Oral TID Etta Quill, DO   0.25 mg at 03/23/18 0846  .  rosuvastatin (CRESTOR) tablet 20 mg  20 mg Oral Daily Jennette Kettle M, DO   20 mg at 03/23/18 0847  . sertraline (ZOLOFT) tablet 300 mg  300 mg Oral Daily Jennette Kettle M, DO   300 mg at 03/22/18 1009  . traZODone (DESYREL) tablet 50 mg  50 mg Oral QHS Jennette Kettle M, DO   50 mg at 03/22/18 2136     Discharge Medications: Please see discharge summary for a list of discharge medications.  Relevant Imaging Results:  Relevant Lab Results:   Additional Information ssn: [id number]  Wende Neighbors, LCSW

## 2018-03-23 NOTE — Discharge Instructions (Signed)
Catheter-Associated Urinary Tract Infection FAQs  What is "catheter-associated urinary tract infection"?  A urinary tract infection (also called "UTI") is an infection in the urinary system, which includes the bladder (which stores the urine) and the kidneys (which filter the blood to make urine). Germs (for example, bacteria or yeasts) do not normally live in these areas; but if germs are introduced, an infection can occur.  If you have a urinary catheter, germs can travel along the catheter and cause an infection in your bladder or your kidney; in that case it is called a catheter-associated urinary tract infection (or "CA-UTI").  What is a urinary catheter?  A urinary catheter is a thin tube placed in the bladder to drain urine. Urine drains through the tube into a bag that collects the urine. A urinary catheter may be used:   If you are not able to urinate on your own   To measure the amount of urine that you make, for example, during intensive care   During and after some types of surgery   During some tests of the kidneys and bladder    People with urinary catheters have a much higher chance of getting a urinary tract infection than people who don't have a catheter.  How do I get a catheter-associated urinary tract infection (CA-UTI)?  If germs enter the urinary tract, they may cause an infection. Many of the germs that cause a catheter-associated urinary tract infection are common germs found in your intestines that do not usually cause an infection there. Germs can enter the urinary tract when the catheter is being put in or while the catheter remains in the bladder.  What are the symptoms of a urinary tract infection?  Some of the common symptoms of a urinary tract infection are:   Burning or pain in the lower abdomen (that is, below the stomach)   Fever   Bloody urine may be a sign of infection, but is also caused by other problems   Burning during urination or an increase in the frequency of  urination after the catheter is removed.    Sometimes people with catheter-associated urinary tract infections do not have these symptoms of infection.  Can catheter-associated urinary tract infections be treated?  Yes, most catheter-associated urinary tract infections can be treated with antibiotics and removal or change of the catheter. Your doctor will determine which antibiotic is best for you.  What are some of the things that hospitals are doing to prevent catheter-associated urinary tract infections?  To prevent urinary tract infections, doctors and nurses take the following actions.  Catheter insertion   Catheters are put in only when necessary and they are removed as soon as possible.   Only properly trained persons insert catheters using sterile ("clean") technique.   The skin in the area where the catheter will be inserted is cleaned before inserting the catheter.   Other methods to drain the urine are sometimes used, such as:  ? External catheters in men (these look like condoms and are placed over the penis rather than into the penis)  ? Putting a temporary catheter in to drain the urine and removing it right away. This is called intermittent urethral catheterization.    Catheter care   Healthcare providers clean their hands by washing them with soap and water or using an alcohol-based hand rub before and after touching your catheter.   If you do not see your providers clean their hands, please ask them to do so.     Avoid disconnecting the catheter and drain tube. This helps to prevent germs from getting into the catheter tube.   The catheter is secured to the leg to prevent pulling on the catheter.   Avoid twisting or kinking the catheter.   Keep the bag lower than the bladder to prevent urine from backflowing to the bladder.   Empty the bag regularly. The drainage spout should not touch anything while emptying the bag.    What can I do to help prevent catheter-associated urinary tract  infections if I have a catheter?   Always clean your hands before and after doing catheter care.   Always keep your urine bag below the level of your bladder.   Do not tug or pull on the tubing.   Do not twist or kink the catheter tubing.   Ask your healthcare provider each day if you still need the catheter.    What do I need to do when I go home from the hospital?   If you will be going home with a catheter, your doctor or nurse should explain everything you need to know about taking care of the catheter. Make sure you understand how to care for it before you leave the hospital.   If you develop any of the symptoms of a urinary tract infection, such as burning or pain in the lower abdomen, fever, or an increase in the frequency of urination, contact your doctor or nurse immediately.   Before you go home, make sure you know who to contact if you have questions or problems after you get home.  If you have questions, please ask your doctor or nurse.  Developed and co-sponsored by The Society for Healthcare Epidemiology of America (SHEA); Infectious Diseases Society of America (IDSA); American Hospital Association; Association for Professionals in Infection Control and Epidemiology (APIC); Centers for Disease Control and Prevention (CDC); and The Joint Commission.  This information is not intended to replace advice given to you by your health care provider. Make sure you discuss any questions you have with your health care provider.  Document Released: 08/28/2012 Document Revised: 05/17/2016 Document Reviewed: 02/17/2015  Elsevier Interactive Patient Education  2018 Elsevier Inc.

## 2018-03-23 NOTE — Progress Notes (Signed)
Attempted to call report to nurse at [facility]. Attempt 1. Left message and called back. Receptionist took call back number.

## 2018-03-23 NOTE — Clinical Social Work Placement (Signed)
   CLINICAL SOCIAL WORK PLACEMENT  NOTE  Date:  03/23/2018  Patient Details  Name: [name]. MRN: [id number] Date of Birth: Oct 03, 1935  Clinical Social Work is seeking post-discharge placement for this patient at the [place] level of care (*CSW will initial, date and re-position this form in  chart as items are completed):  Yes   Patient/family provided with [place] Work Department's list of facilities offering this level of care within the geographic area requested by the patient (or if unable, by the patient's family).  Yes   Patient/family informed of their freedom to choose among providers that offer the needed level of care, that participate in Medicare, Medicaid or managed care program needed by the patient, have an available bed and are willing to accept the patient.  Yes   Patient/family informed of 's ownership interest in [facility] and [facility] [place] [facility], as well as of the fact that they are under no obligation to receive care at these facilities.  PASRR submitted to EDS on       PASRR number received on       Existing PASRR number confirmed on       FL2 transmitted to all facilities in geographic area requested by pt/family on       FL2 transmitted to all facilities within larger geographic area on       Patient informed that his/her managed care company has contracts with or will negotiate with certain facilities, including the following:            Patient/family informed of bed offers received.  Patient chooses bed at [facility] be returning)     Physician recommends and patient chooses bed at      Patient to be transferred to [facility] on 03/23/18.  Patient to be transferred to facility by [facility]     Patient family notified on 03/23/18 of transfer.  Name of family member notified:  [name] Heuskin     PHYSICIAN       Additional Comment:     _______________________________________________ Wende Neighbors, LCSW 03/23/2018, 4:03 PM

## 2018-03-23 NOTE — Progress Notes (Addendum)
[name]. to be D/C'd Nursing Home per MD order.  Discussed prescriptions and follow up appointments with the patient. Prescriptions given to patient, medication list explained in detail. Pt verbalized understanding.  Allergies as of 03/23/2018      Reactions   Plant Sterols And Stanols Other (See Comments)   ON MAR      Medication List    TAKE these medications   acetaminophen 325 MG tablet Commonly known as:  TYLENOL Take 650 mg by mouth every 6 (six) hours as needed (pain). Not to exceed 8 tabs per 24hrs.   albuterol 108 (90 Base) MCG/ACT inhaler Commonly known as:  PROVENTIL HFA;VENTOLIN HFA Inhale 1-2 puffs into the lungs every 6 (six) hours as needed for wheezing or shortness of breath. What changed:  how much to take   aspirin 81 MG chewable tablet Chew 81 mg by mouth daily.   carbidopa-levodopa 25-250 MG tablet Commonly known as:  SINEMET IR Take 1 tablet by mouth 3 (three) times daily. At 7am, 2pm, 7pm.   cefdinir 300 MG capsule Commonly known as:  OMNICEF Take 1 capsule (300 mg total) by mouth 2 (two) times daily.   clonazePAM 0.5 MG tablet Commonly known as:  KLONOPIN Take 0.5 tablets (0.25 mg total) by mouth 2 (two) times daily as needed (anxiety).   clopidogrel 75 MG tablet Commonly known as:  PLAVIX Take 1 tablet (75 mg total) by mouth daily.   entacapone 200 MG tablet Commonly known as:  COMTAN Take 200 mg by mouth 3 (three) times daily. At 7am 2pm, 7pm.   feeding supplement (GLUCERNA SHAKE) Liqd Take 237 mLs by mouth 2 (two) times daily between meals.   ferrous sulfate 325 (65 FE) MG tablet Take 325 mg by mouth 2 (two) times daily.   isosorbide mononitrate 30 MG 24 hr tablet Commonly known as:  IMDUR Take 1 tablet (30 mg total) by mouth daily.   lamoTRIgine 150 MG tablet Commonly known as:  LAMICTAL Take 1 tablet (150 mg total) by mouth 2 (two) times daily.   multivitamin with minerals Tabs tablet Take 1 tablet by mouth daily.    nystatin powder Generic drug:  nystatin Apply 1 g topically 2 (two) times daily. Apply to groin for redness   pantoprazole 40 MG tablet Commonly known as:  PROTONIX Take 40 mg by mouth daily. Reported on 07/06/2016   polyethylene glycol packet Commonly known as:  MIRALAX / GLYCOLAX Take 17 g by mouth daily.   predniSONE 2.5 MG tablet Commonly known as:  DELTASONE Take 2.5 mg by mouth daily with breakfast.   risperiDONE 0.25 MG tablet Commonly known as:  RISPERDAL Take 0.25 mg by mouth 3 (three) times daily.   rosuvastatin 20 MG tablet Commonly known as:  CRESTOR Take 20 mg by mouth daily.   sertraline 100 MG tablet Commonly known as:  ZOLOFT Take 2 tablets (200 mg total) by mouth daily. What changed:  how much to take   traZODone 50 MG tablet Commonly known as:  DESYREL Take 1 tablet (50 mg total) by mouth at bedtime.       Vitals:   03/23/18 0617 03/23/18 1439  BP: (!) 154/68 (!) 154/64  Pulse: 60 (!) 52  Resp: 19 19  Temp: 97.6 F (36.4 C) 98.2 F (36.8 C)  SpO2: 95% 99%    Skin clean, dry and intact without evidence of skin break down, no evidence of skin tears noted. IV catheter discontinued intact. Site without signs  and symptoms of complications. Dressing and pressure applied. Pt denies pain at this time. No complaints noted.  An After Visit Summary was printed and given to the patient. Patient escorted via Biomedical scientist and D/C  To [facility] via [place]. Pt leaving with Chronic Foley Catheter  Per MD note.  Haywood Lasso BSN, RN [employer] Phone [phone]

## 2018-03-23 NOTE — Discharge Summary (Signed)
Physician Discharge Summary  Rickey Primus. ASN:[id number] DOB: 1935-11-12 DOA: 03/21/2018  PCP: Jilda Panda, MD  Admit date: 03/21/2018 Discharge date: 03/23/2018  Admitted From: Assisted living facility Disposition: Assisted living facility  Recommendations for Outpatient Follow-up:  1. Follow up with PCP in 1-2 weeks 2. Please obtain BMP/CBC in one week   Home Health: No Equipment/Devices: Foley catheter  Discharge Condition: Stable CODE STATUS: DO NOT RESUSCITATE Diet recommendation: Regular   Brief/Interim Summary:  #) Catheter associated UTI: Patient was admitted with aggressive behavior which often is how he is catheter associated UTI present itself.  His urine grew out greater than 100,000 colonies of Citrobacter freundii that was susceptible to everything except cefazolin.  Patient was initially on Zosyn but discharged on total of 2 weeks of antibiotics with oral Cefdinir.  #) Parkinson's dementia with behavioral disturbances: Patient was continued on home carbidopa-levodopa 25-250 mg 3 times daily and entacapone 200 mg 3 times daily.  Patient was continued on lamotrigine 150 mg twice a day for mood stabilizer.  #) Coronary artery disease: Patient was continued on aspirin, clopidogrel, rosuvastatin, isosorbide mononitrate.   Discharge Diagnoses:  Principal Problem:   Catheter-associated urinary tract infection ([place]) Active Problems:   Leukocytosis   Hypertension   Bipolar disorder ([name])   Chronic indwelling Foley catheter   Dementia due to Parkinson's disease with behavioral disturbance [facility])    Discharge Instructions  Discharge Instructions    Call MD for:  difficulty breathing, headache or visual disturbances   Complete by:  As directed    Call MD for:  persistant nausea and vomiting   Complete by:  As directed    Call MD for:  redness, tenderness, or signs of infection (pain, swelling, redness, odor or green/yellow discharge around incision site)    Complete by:  As directed    Call MD for:  severe uncontrolled pain   Complete by:  As directed    Call MD for:  temperature >100.4   Complete by:  As directed    Diet - low sodium heart healthy   Complete by:  As directed    Discharge instructions   Complete by:  As directed    Please take your antibiotics as prescribed.  Please follow-up with your PCP in 1-2 weeks.   Increase activity slowly   Complete by:  As directed      Allergies as of 03/23/2018      Reactions   Plant Sterols And Stanols Other (See Comments)   ON MAR      Medication List    TAKE these medications   acetaminophen 325 MG tablet Commonly known as:  TYLENOL Take 650 mg by mouth every 6 (six) hours as needed (pain). Not to exceed 8 tabs per 24hrs.   albuterol 108 (90 Base) MCG/ACT inhaler Commonly known as:  PROVENTIL HFA;VENTOLIN HFA Inhale 1-2 puffs into the lungs every 6 (six) hours as needed for wheezing or shortness of breath. What changed:  how much to take   aspirin 81 MG chewable tablet Chew 81 mg by mouth daily.   carbidopa-levodopa 25-250 MG tablet Commonly known as:  SINEMET IR Take 1 tablet by mouth 3 (three) times daily. At 7am, 2pm, 7pm.   cefdinir 300 MG capsule Commonly known as:  OMNICEF Take 1 capsule (300 mg total) by mouth 2 (two) times daily.   clonazePAM 0.5 MG tablet Commonly known as:  KLONOPIN Take 0.5 tablets (0.25 mg total) by mouth 2 (two) times daily as  needed (anxiety).   clopidogrel 75 MG tablet Commonly known as:  PLAVIX Take 1 tablet (75 mg total) by mouth daily.   entacapone 200 MG tablet Commonly known as:  COMTAN Take 200 mg by mouth 3 (three) times daily. At 7am 2pm, 7pm.   feeding supplement (GLUCERNA SHAKE) Liqd Take 237 mLs by mouth 2 (two) times daily between meals.   ferrous sulfate 325 (65 FE) MG tablet Take 325 mg by mouth 2 (two) times daily.   isosorbide mononitrate 30 MG 24 hr tablet Commonly known as:  IMDUR Take 1 tablet (30 mg total) by  mouth daily.   lamoTRIgine 150 MG tablet Commonly known as:  LAMICTAL Take 1 tablet (150 mg total) by mouth 2 (two) times daily.   multivitamin with minerals Tabs tablet Take 1 tablet by mouth daily.   nystatin powder Generic drug:  nystatin Apply 1 g topically 2 (two) times daily. Apply to groin for redness   pantoprazole 40 MG tablet Commonly known as:  PROTONIX Take 40 mg by mouth daily. Reported on 07/06/2016   polyethylene glycol packet Commonly known as:  MIRALAX / GLYCOLAX Take 17 g by mouth daily.   predniSONE 2.5 MG tablet Commonly known as:  DELTASONE Take 2.5 mg by mouth daily with breakfast.   risperiDONE 0.25 MG tablet Commonly known as:  RISPERDAL Take 0.25 mg by mouth 3 (three) times daily.   rosuvastatin 20 MG tablet Commonly known as:  CRESTOR Take 20 mg by mouth daily.   sertraline 100 MG tablet Commonly known as:  ZOLOFT Take 2 tablets (200 mg total) by mouth daily. What changed:  how much to take   traZODone 50 MG tablet Commonly known as:  DESYREL Take 1 tablet (50 mg total) by mouth at bedtime.       Allergies  Allergen Reactions  . Plant Sterols And Stanols Other (See Comments)    ON MAR    Consultations:  None   Procedures/Studies: Dg Chest 2 View  Result Date: 03/21/2018 CLINICAL DATA:  Altered mental status.  Aggression. EXAM: CHEST - 2 VIEW COMPARISON:  01/16/2018 FINDINGS: Stable mild cardiomegaly. Aortic atherosclerosis. Low lung volumes again seen. Mild scarring in the right midlung is stable. No evidence of pulmonary infiltrate or edema. No evidence of pneumothorax or pleural effusion. Multiple old left rib fracture deformities and severe bilateral glenohumeral osteoarthritis again noted. IMPRESSION: Stable mild cardiomegaly and mild right lung scarring. No active lung disease. Electronically Signed   By: Earle Gell M.D.   On: 03/21/2018 19:06   Ct Head Wo Contrast  Result Date: 03/21/2018 CLINICAL DATA:  Altered mental  status. EXAM: CT HEAD WITHOUT CONTRAST TECHNIQUE: Contiguous axial images were obtained from the base of the skull through the vertex without intravenous contrast. COMPARISON:  CT head dated August 06, 2017. FINDINGS: Brain: No evidence of acute infarction, hemorrhage, hydrocephalus, extra-axial collection or mass lesion/mass effect. Stable atrophy and chronic microvascular ischemic changes. Unchanged posterior fossa arachnoid cyst. Vascular: Calcified atherosclerosis at the skullbase. No hyperdense vessel. Skull: Normal. Negative for fracture or focal lesion. Sinuses/Orbits: Tiny air-fluid level in the left sphenoid sinus. Remaining paranasal sinuses are clear. The mastoid air cells are underdeveloped. No acute orbital abnormality. Other: None. IMPRESSION: 1.  No acute intracranial abnormality. Electronically Signed   By: Titus Dubin M.D.   On: 03/21/2018 16:34       Subjective:   Discharge Exam: Vitals:   03/22/18 2100 03/23/18 0617  BP: (!) 121/53 (!) 154/68  Pulse: Marland Kitchen)  57 60  Resp: 20 19  Temp: 98.5 F (36.9 C) 97.6 F (36.4 C)  SpO2: 95% 95%   Vitals:   03/22/18 0439 03/22/18 1533 03/22/18 2100 03/23/18 0617  BP: 127/63 (!) 124/55 (!) 121/53 (!) 154/68  Pulse: 72 63 (!) 57 60  Resp: 19 20 20 19   Temp: 98.5 F (36.9 C) 98.4 F (36.9 C) 98.5 F (36.9 C) 97.6 F (36.4 C)  TempSrc: Oral Oral Oral Oral  SpO2: 93% 94% 95% 95%  Weight:      Height:        General: Patient is alert and interactive but is not oriented to place, time, situation Cardiovascular: RRR, S1/S2 +, no rubs, no gallops Respiratory: CTA bilaterally, no wheezing, no rhonchi Abdominal: Soft, NT, ND, bowel sounds + Extremities: Cogwheel rigidity noted, no edema    The results of significant diagnostics from this hospitalization (including imaging, microbiology, ancillary and laboratory) are listed below for reference.     Microbiology: Recent Results (from the past 240 hour(s))  Urine culture      Status: Abnormal   Collection Time: 03/21/18  7:10 PM  Result Value Ref Range Status   Specimen Description   Final    URINE, CATHETERIZED Performed at [facility] [address] [place], [place] [address]    Special Requests   Final    NONE Performed at [facility], [place] [address] [place], [place] [address]    Culture >=100,000 COLONIES/mL CITROBACTER FREUNDII (A)  Final   Report Status 03/23/2018 FINAL  Final   Organism ID, Bacteria CITROBACTER FREUNDII (A)  Final      Susceptibility   Citrobacter freundii - MIC*    CEFAZOLIN >=64 RESISTANT Resistant     CEFTRIAXONE <=1 SENSITIVE Sensitive     CIPROFLOXACIN <=0.25 SENSITIVE Sensitive     GENTAMICIN <=1 SENSITIVE Sensitive     IMIPENEM <=0.25 SENSITIVE Sensitive     NITROFURANTOIN <=16 SENSITIVE Sensitive     TRIMETH/SULFA <=20 SENSITIVE Sensitive     PIP/TAZO <=4 SENSITIVE Sensitive     * >=100,000 COLONIES/mL CITROBACTER FREUNDII  MRSA PCR Screening     Status: Abnormal   Collection Time: 03/21/18 10:05 PM  Result Value Ref Range Status   MRSA by PCR POSITIVE (A) NEGATIVE Final    Comment:        The GeneXpert MRSA Assay (FDA approved for NASAL specimens only), is one component of a comprehensive MRSA colonization surveillance program. It is not intended to diagnose MRSA infection nor to guide or monitor treatment for MRSA infections. RESULT CALLED TO, READ BACK BY AND VERIFIED WITH: L WILLIAMS,RN @0044  03/22/18 MKELLY Performed at [facility], [place] [address], [place], [place] [address]      Labs: BNP (last 3 results) No results for input(s): BNP in the last 8760 hours. Basic Metabolic Panel: Recent Labs  Lab 03/21/18 1545 03/22/18 0545  NA 139 136  K 4.4 4.3  CL 103 101  CO2 24 26  GLUCOSE 108* 110*  BUN 24* 22*  CREATININE 0.95 1.05  CALCIUM 9.4 8.8*   Liver Function Tests: No results for input(s): AST, ALT, ALKPHOS, BILITOT, PROT,  ALBUMIN in the last 168 hours. No results for input(s): LIPASE, AMYLASE in the last 168 hours. No results for input(s): AMMONIA in the last 168 hours. CBC: Recent Labs  Lab 03/21/18 1545 03/22/18 0545 03/23/18 0608  WBC 20.5* 17.2* 10.2  NEUTROABS 18.8*  --  7.8*  HGB  14.2 12.4* 11.6*  HCT 42.3 38.2* 35.8*  MCV 90.6 91.8 92.3  PLT 185 186 183   Cardiac Enzymes: No results for input(s): CKTOTAL, CKMB, CKMBINDEX, TROPONINI in the last 168 hours. BNP: Invalid input(s): POCBNP CBG: No results for input(s): GLUCAP in the last 168 hours. D-Dimer No results for input(s): DDIMER in the last 72 hours. Hgb A1c No results for input(s): HGBA1C in the last 72 hours. Lipid Profile No results for input(s): CHOL, HDL, LDLCALC, TRIG, CHOLHDL, LDLDIRECT in the last 72 hours. Thyroid function studies No results for input(s): TSH, T4TOTAL, T3FREE, THYROIDAB in the last 72 hours.  Invalid input(s): FREET3 Anemia work up No results for input(s): VITAMINB12, FOLATE, FERRITIN, TIBC, IRON, RETICCTPCT in the last 72 hours. Urinalysis    Component Value Date/Time   COLORURINE YELLOW 03/21/2018 1910   APPEARANCEUR HAZY (A) 03/21/2018 1910   LABSPEC 1.016 03/21/2018 1910   PHURINE 6.0 03/21/2018 1910   GLUCOSEU NEGATIVE 03/21/2018 1910   HGBUR LARGE (A) 03/21/2018 1910   BILIRUBINUR NEGATIVE 03/21/2018 1910   KETONESUR NEGATIVE 03/21/2018 1910   PROTEINUR 30 (A) 03/21/2018 1910   UROBILINOGEN 0.2 10/05/2015 1555   NITRITE NEGATIVE 03/21/2018 1910   LEUKOCYTESUR LARGE (A) 03/21/2018 1910   Sepsis Labs Invalid input(s): PROCALCITONIN,  WBC,  LACTICIDVEN Microbiology Recent Results (from the past 240 hour(s))  Urine culture     Status: Abnormal   Collection Time: 03/21/18  7:10 PM  Result Value Ref Range Status   Specimen Description   Final    URINE, CATHETERIZED Performed at [facility], [place] [address], [place], [place] [address]    Special Requests   Final     NONE Performed at [facility], [place] [address] [place], [place] [address]    Culture >=100,000 COLONIES/mL CITROBACTER FREUNDII (A)  Final   Report Status 03/23/2018 FINAL  Final   Organism ID, Bacteria CITROBACTER FREUNDII (A)  Final      Susceptibility   Citrobacter freundii - MIC*    CEFAZOLIN >=64 RESISTANT Resistant     CEFTRIAXONE <=1 SENSITIVE Sensitive     CIPROFLOXACIN <=0.25 SENSITIVE Sensitive     GENTAMICIN <=1 SENSITIVE Sensitive     IMIPENEM <=0.25 SENSITIVE Sensitive     NITROFURANTOIN <=16 SENSITIVE Sensitive     TRIMETH/SULFA <=20 SENSITIVE Sensitive     PIP/TAZO <=4 SENSITIVE Sensitive     * >=100,000 COLONIES/mL CITROBACTER FREUNDII  MRSA PCR Screening     Status: Abnormal   Collection Time: 03/21/18 10:05 PM  Result Value Ref Range Status   MRSA by PCR POSITIVE (A) NEGATIVE Final    Comment:        The GeneXpert MRSA Assay (FDA approved for NASAL specimens only), is one component of a comprehensive MRSA colonization surveillance program. It is not intended to diagnose MRSA infection nor to guide or monitor treatment for MRSA infections. RESULT CALLED TO, READ BACK BY AND VERIFIED WITH: L WILLIAMS,RN @0044  03/22/18 MKELLY Performed at [facility], [place] [address] [place], [place] [address]      Time coordinating discharge: Over 30 minutes  SIGNED:   Cristy Folks, MD  [facility] 03/23/2018, 1:59 PM  If 7PM-7AM, please contact night-coverage [email or web address] Password [email or web address]

## 2018-03-23 NOTE — Progress Notes (Signed)
Clinical Social Worker facilitated patient discharge including contacting patient family and facility to confirm patient discharge plans.  Clinical information faxed to facility and family agreeable with plan.  CSW arranged ambulance transport via [place] to [facility] .  RN to call [phone] for report prior to discharge.  Clinical Social Worker will sign off for now as social work intervention is no longer needed. Please consult [place] again if new need arises.  Rhea Pink, MSW, [place]

## 2018-04-16 ENCOUNTER — Encounter: Payer: Self-pay | Admitting: Neurology

## 2018-04-16 ENCOUNTER — Ambulatory Visit (INDEPENDENT_AMBULATORY_CARE_PROVIDER_SITE_OTHER): Payer: Medicare Other | Admitting: Neurology

## 2018-04-16 VITALS — BP 122/70 | HR 53 | Ht 66.0 in

## 2018-04-16 DIAGNOSIS — G20 Parkinson's disease: Secondary | ICD-10-CM

## 2018-04-16 NOTE — Progress Notes (Signed)
Reason for visit: Parkinson's disease, dementia  [name]. is an 83 y.o. male  History of present illness:  Mr. [name] is an 83 year old right-handed white male with a history of bipolar disorder, dementia, and Parkinson's disease.  The patient is nonambulatory, he has not walked in about a year and a half.  The patient has not had any recent falls.  He recently was in the hospital on 21 March 2018 with increased confusion and agitation, he was found to have a bladder infection.  This has been treated.  The patient is on Risperdal taking 0.25 mg in the morning and evening, 0.5 mg at midday.  The is on Sinemet taking the 25/100 mg tablets, 1 tablet 3 times daily.  He returns to the office today for an evaluation.  Past Medical History:  Diagnosis Date  . Anxiety   . Arthritis   . Benign prostate hyperplasia   . Bipolar disorder ([place])   . C2 cervical fracture ([name])    due to pt fall  . Chronic indwelling Foley catheter   . Depression   . Diabetes mellitus type II   . Falls   . Flu   . Hearing aid worn   . Hypertension   . Memory difficulty 08/21/2014  . Memory loss   . MRSA infection (methicillin-resistant Staphylococcus aureus)   . Neuromuscular disorder ([place])    parkinsons  . Parkinson disease ([place])   . Pneumonia   . SIRS (systemic inflammatory response syndrome) ([name])   . UTI (urinary tract infection)     Past Surgical History:  Procedure Laterality Date  . TOTAL HIP ARTHROPLASTY    . VIDEO ASSISTED THORACOSCOPY (VATS)/EMPYEMA  12/16/2012   Procedure: VIDEO ASSISTED THORACOSCOPY (VATS)/EMPYEMA;  Surgeon: Melrose Nakayama, MD;  Location: [facility];  Service: Thoracic;  Laterality: Right;  Marland Kitchen VIDEO BRONCHOSCOPY  12/16/2012   Procedure: VIDEO BRONCHOSCOPY;  Surgeon: Melrose Nakayama, MD;  Location: [facility];  Service: Thoracic;  Laterality: Right;    Family History  Problem Relation Age of Onset  . Depression Father   . Cancer Mother   . Heart disease  Mother     Social history:  reports that he has never smoked. He has never used smokeless tobacco. He reports that he does not drink alcohol or use drugs.    Allergies  Allergen Reactions  . Plant Sterols And Stanols Other (See Comments)    ON MAR    Medications:  Prior to Admission medications   Medication Sig Start Date End Date Taking? Authorizing Provider  acetaminophen (TYLENOL) 325 MG tablet Take 650 mg by mouth every 6 (six) hours as needed (pain). Not to exceed 8 tabs per 24hrs.   Yes [provider]  albuterol (PROVENTIL HFA;VENTOLIN HFA) 108 (90 Base) MCG/ACT inhaler Inhale 1-2 puffs into the lungs every 6 (six) hours as needed for wheezing or shortness of breath. Patient taking differently: Inhale 2 puffs into the lungs every 6 (six) hours as needed for wheezing or shortness of breath.  01/16/18  Yes Kinnie Feil, PA-C  aspirin 81 MG chewable tablet Chew 81 mg by mouth daily.   Yes [provider]  carbidopa-levodopa (SINEMET IR) 25-250 MG per tablet Take 1 tablet by mouth 3 (three) times daily. At 7am, 2pm, 7pm.   Yes [provider]  cefdinir (OMNICEF) 300 MG capsule Take 1 capsule (300 mg total) by mouth 2 (two) times daily. 03/23/18  Yes Purohit, Konrad Dolores, MD  clonazePAM (KLONOPIN) 0.5 MG tablet Take 0.5 tablets (0.25 mg total) by mouth 2 (two) times daily as needed (anxiety). 08/08/17  Yes Mariel Aloe, MD  clopidogrel (PLAVIX) 75 MG tablet Take 1 tablet (75 mg total) by mouth daily. 10/07/15  Yes Florencia Reasons, MD  entacapone (COMTAN) 200 MG tablet Take 200 mg by mouth 3 (three) times daily. At 7am 2pm, 7pm.   Yes [provider]  feeding supplement, GLUCERNA SHAKE, (GLUCERNA SHAKE) LIQD Take 237 mLs by mouth 2 (two) times daily between meals. 03/16/16  Yes Kelvin Cellar, MD  ferrous sulfate 325 (65 FE) MG tablet Take 325 mg by mouth 2 (two) times daily.   Yes [provider]  isosorbide mononitrate (IMDUR) 30 MG 24 hr tablet  Take 1 tablet (30 mg total) by mouth daily. 10/07/15  Yes Florencia Reasons, MD  lamoTRIgine (LAMICTAL) 150 MG tablet Take 1 tablet (150 mg total) by mouth 2 (two) times daily. 07/17/17  Yes Arfeen, Arlyce Harman, MD  Multiple Vitamin (MULTIVITAMIN WITH MINERALS) TABS tablet Take 1 tablet by mouth daily.   Yes [provider]  nystatin (NYSTATIN) powder Apply 1 g topically 2 (two) times daily. Apply to groin for redness   Yes [provider]  pantoprazole (PROTONIX) 40 MG tablet Take 40 mg by mouth daily. Reported on 07/06/2016   Yes [provider]  polyethylene glycol (MIRALAX / GLYCOLAX) packet Take 17 g by mouth daily.   Yes [provider]  predniSONE (DELTASONE) 2.5 MG tablet Take 2.5 mg by mouth daily with breakfast.   Yes [provider]  risperiDONE (RISPERDAL) 0.25 MG tablet Take 0.25 mg by mouth 3 (three) times daily.   Yes [provider]  rosuvastatin (CRESTOR) 20 MG tablet Take 20 mg by mouth daily.   Yes [provider]  sertraline (ZOLOFT) 100 MG tablet Take 2 tablets (200 mg total) by mouth daily. Patient taking differently: Take 300 mg by mouth daily.  07/17/17  Yes Arfeen, Arlyce Harman, MD  traZODone (DESYREL) 50 MG tablet Take 1 tablet (50 mg total) by mouth at bedtime. 01/17/17  Yes Arfeen, Arlyce Harman, MD    ROS:  Out of a complete 14 system review of symptoms, the patient complains only of the following symptoms, and all other reviewed systems are negative.  Walking difficulty Agitation, confusion, depression, anxiety  Blood pressure 122/70, pulse (!) 53, height 5\' 6"  (1.676 m).  Physical Exam  General: The patient is alert and cooperative at the time of the examination.  Skin: 2+ edema below the knees is seen bilaterally.   Neurologic Exam  Mental status: The patient is alert and oriented x 3 at the time of the examination. The Mini-Mental status examination done today shows a total score 13/30.   Cranial nerves: Facial  symmetry is present. Speech is normal, no aphasia or dysarthria is noted. Extraocular movements are full. Visual fields are full.  Motor: The patient has good strength in all 4 extremities.  Sensory examination: Soft touch sensation is symmetric on the face, arms, and legs.  Coordination: The patient has good finger-nose-finger and heel-to-shin bilaterally.  Gait and station: The patient requires assistance with standing, once up, he can take several steps with assistance, gait is wide-based.  Reflexes: Deep tendon reflexes are symmetric.   Assessment/Plan:  1.  Parkinsonism  2.  Gait disturbance  3.  Dementia  The patient resides at [facility], he has had progressive worsening of his dementia.  He has not  been ambulatory in about a year and a half.  For this reason, the Sinemet will be reduced taking 25/100 mg tablets, 1/2 tablet 3 times daily.  He will follow-up in 1 year.  Jill Alexanders MD 04/16/2018 12:50 PM  [facility] [address] [place], [place] [address]  Phone [phone] Fax [phone]

## 2018-04-16 NOTE — Patient Instructions (Signed)
   Reduce sinemet 25/100 mg tablet to 1/2 tablet three times a day.

## 2018-05-09 ENCOUNTER — Emergency Department (HOSPITAL_COMMUNITY)
Admission: EM | Admit: 2018-05-09 | Discharge: 2018-05-09 | Disposition: A | Discharge status: Skilled Nursing Facility | Payer: Medicare Other | Attending: Emergency Medicine | Admitting: Emergency Medicine

## 2018-05-09 ENCOUNTER — Encounter (HOSPITAL_COMMUNITY): Payer: Self-pay

## 2018-05-09 ENCOUNTER — Emergency Department (HOSPITAL_COMMUNITY): Payer: Medicare Other

## 2018-05-09 DIAGNOSIS — Y92128 Other place in nursing home as the place of occurrence of the external cause: Secondary | ICD-10-CM | POA: Diagnosis not present

## 2018-05-09 DIAGNOSIS — W19XXXA Unspecified fall, initial encounter: Secondary | ICD-10-CM | POA: Insufficient documentation

## 2018-05-09 DIAGNOSIS — Z79899 Other long term (current) drug therapy: Secondary | ICD-10-CM | POA: Diagnosis not present

## 2018-05-09 DIAGNOSIS — E119 Type 2 diabetes mellitus without complications: Secondary | ICD-10-CM | POA: Insufficient documentation

## 2018-05-09 DIAGNOSIS — G20 Parkinson's disease: Secondary | ICD-10-CM | POA: Insufficient documentation

## 2018-05-09 DIAGNOSIS — S0181XA Laceration without foreign body of other part of head, initial encounter: Secondary | ICD-10-CM | POA: Diagnosis not present

## 2018-05-09 DIAGNOSIS — Y999 Unspecified external cause status: Secondary | ICD-10-CM | POA: Diagnosis not present

## 2018-05-09 DIAGNOSIS — Y92129 Unspecified place in nursing home as the place of occurrence of the external cause: Secondary | ICD-10-CM

## 2018-05-09 DIAGNOSIS — Y939 Activity, unspecified: Secondary | ICD-10-CM | POA: Insufficient documentation

## 2018-05-09 DIAGNOSIS — S0083XA Contusion of other part of head, initial encounter: Secondary | ICD-10-CM

## 2018-05-09 DIAGNOSIS — S0990XA Unspecified injury of head, initial encounter: Secondary | ICD-10-CM | POA: Diagnosis present

## 2018-05-09 NOTE — ED Notes (Signed)
Per EMS, patient had an unwitnessed fall. Per EMS, patient had lacerations to forehead and nose. No blood thinners noted.  EMS vital signs: CBG 106                            BP: 147/67                             HR: 106                             RR: 14

## 2018-05-09 NOTE — ED Provider Notes (Signed)
[facility] DEPT Provider Note: Georgena Spurling, MD, FACEP  CSN: [id number] MRN: [id number] ARRIVAL: 05/09/18 at [place]: [place]  Fall  Level 5 caveat: Dementia HISTORY OF PRESENT ILLNESS  05/09/18 1:41 AM [name]. is a 83 y.o. male with history of Parkinson's disease, dementia and frequent falls.  He is here after an unwitnessed fall at his living facility.  He was noted to have lacerations to his forehead.  He is reportedly at his mental status baseline.  The patient denies pain.   Past Medical History:  Diagnosis Date  . Anxiety   . Arthritis   . Benign prostate hyperplasia   . Bipolar disorder ([place])   . C2 cervical fracture ([name])    due to pt fall  . Chronic indwelling Foley catheter   . Depression   . Diabetes mellitus type II   . Falls   . Flu   . Hearing aid worn   . Hypertension   . Memory difficulty 08/21/2014  . Memory loss   . MRSA infection (methicillin-resistant Staphylococcus aureus)   . Neuromuscular disorder ([place])    parkinsons  . Parkinson disease ([place])   . Pneumonia   . SIRS (systemic inflammatory response syndrome) ([name])   . UTI (urinary tract infection)     Past Surgical History:  Procedure Laterality Date  . TOTAL HIP ARTHROPLASTY    . VIDEO ASSISTED THORACOSCOPY (VATS)/EMPYEMA  12/16/2012   Procedure: VIDEO ASSISTED THORACOSCOPY (VATS)/EMPYEMA;  Surgeon: Melrose Nakayama, MD;  Location: [facility];  Service: Thoracic;  Laterality: Right;  Marland Kitchen VIDEO BRONCHOSCOPY  12/16/2012   Procedure: VIDEO BRONCHOSCOPY;  Surgeon: Melrose Nakayama, MD;  Location: [facility];  Service: Thoracic;  Laterality: Right;    Family History  Problem Relation Age of Onset  . Depression Father   . Cancer Mother   . Heart disease Mother     Social History   Tobacco Use  . Smoking status: Never Smoker  . Smokeless tobacco: Never Used  Substance Use Topics  . Alcohol use: No    Alcohol/week: 0.0 oz    Comment: occasional  .  Drug use: No    Prior to Admission medications   Medication Sig Start Date End Date Taking? Authorizing Provider  acetaminophen (TYLENOL) 325 MG tablet Take 650 mg by mouth every 6 (six) hours as needed (pain). Not to exceed 8 tabs per 24hrs.    [provider]  albuterol (PROVENTIL HFA;VENTOLIN HFA) 108 (90 Base) MCG/ACT inhaler Inhale 1-2 puffs into the lungs every 6 (six) hours as needed for wheezing or shortness of breath. Patient taking differently: Inhale 2 puffs into the lungs every 6 (six) hours as needed for wheezing or shortness of breath.  01/16/18   Kinnie Feil, PA-C  aspirin 81 MG chewable tablet Chew 81 mg by mouth daily.    [provider]  carbidopa-levodopa (SINEMET IR) 25-250 MG per tablet Take 0.5 tablets by mouth 3 (three) times daily. At 7am, 2pm, 7pm.    [provider]  cefdinir (OMNICEF) 300 MG capsule Take 1 capsule (300 mg total) by mouth 2 (two) times daily. 03/23/18   Purohit, Konrad Dolores, MD  clonazePAM (KLONOPIN) 0.5 MG tablet Take 0.5 tablets (0.25 mg total) by mouth 2 (two) times daily as needed (anxiety). 08/08/17   Mariel Aloe, MD  clopidogrel (PLAVIX) 75 MG tablet Take 1 tablet (75 mg total) by mouth daily. 10/07/15   Florencia Reasons, MD  entacapone (  COMTAN) 200 MG tablet Take 200 mg by mouth 3 (three) times daily. At 7am 2pm, 7pm.    [provider]  feeding supplement, GLUCERNA SHAKE, (GLUCERNA SHAKE) LIQD Take 237 mLs by mouth 2 (two) times daily between meals. 03/16/16   Kelvin Cellar, MD  ferrous sulfate 325 (65 FE) MG tablet Take 325 mg by mouth 2 (two) times daily.    [provider]  isosorbide mononitrate (IMDUR) 30 MG 24 hr tablet Take 1 tablet (30 mg total) by mouth daily. 10/07/15   Florencia Reasons, MD  lamoTRIgine (LAMICTAL) 150 MG tablet Take 1 tablet (150 mg total) by mouth 2 (two) times daily. 07/17/17   Arfeen, Arlyce Harman, MD  Multiple Vitamin (MULTIVITAMIN WITH MINERALS) TABS tablet Take 1 tablet by mouth daily.     [provider]  nystatin (NYSTATIN) powder Apply 1 g topically 2 (two) times daily. Apply to groin for redness    [provider]  pantoprazole (PROTONIX) 40 MG tablet Take 40 mg by mouth daily. Reported on 07/06/2016    [provider]  polyethylene glycol (MIRALAX / GLYCOLAX) packet Take 17 g by mouth daily.    [provider]  predniSONE (DELTASONE) 2.5 MG tablet Take 2.5 mg by mouth daily with breakfast.    [provider]  risperiDONE (RISPERDAL) 0.25 MG tablet Take 0.25 mg by mouth 3 (three) times daily.    [provider]  rosuvastatin (CRESTOR) 20 MG tablet Take 20 mg by mouth daily.    [provider]  sertraline (ZOLOFT) 100 MG tablet Take 2 tablets (200 mg total) by mouth daily. Patient taking differently: Take 300 mg by mouth daily.  07/17/17   Arfeen, Arlyce Harman, MD  traZODone (DESYREL) 50 MG tablet Take 1 tablet (50 mg total) by mouth at bedtime. 01/17/17   Arfeen, Arlyce Harman, MD    Allergies Plant sterols and stanols   REVIEW OF SYSTEMS     PHYSICAL EXAMINATION  Initial Vital Signs There were no vitals taken for this visit.  Examination General: Well-developed, well-nourished male in no acute distress; appearance consistent with age of record HENT: normocephalic; skin tears to left forehead with underlying hematoma; no hemotympanum; deformity of nasal bridge which may be subacute:    Eyes: pupils equal, round and reactive to light; extraocular muscles intact Neck: supple; apparently nontender Heart: regular rate and rhythm Lungs: clear to auscultation bilaterally Abdomen: soft; nondistended; nontender; bowel sounds present Extremities: No acute deformity; no pain on passive range of motion; pulses normal; cogwheel rigidity of left upper extremity Neurologic: Somnolent but readily arousable; motor function intact in all extremities but limited exam due to poor effort Skin: Warm and dry   RESULTS  Summary of  this visit's results, reviewed by myself:   EKG Interpretation  Date/Time:    Ventricular Rate:    PR Interval:    QRS Duration:   QT Interval:    QTC Calculation:   R Axis:     Text Interpretation:        Laboratory Studies: No results found for this or any previous visit (from the past 24 hour(s)). Imaging Studies: Ct Head Wo Contrast  Result Date: 05/09/2018 CLINICAL DATA:  Unwitnessed fall at nursing facility. Lacerations to forehead. EXAM: CT HEAD WITHOUT CONTRAST CT CERVICAL SPINE WITHOUT CONTRAST TECHNIQUE: Multidetector CT imaging of the head and cervical spine was performed following the standard protocol without intravenous contrast. Multiplanar CT image reconstructions of the cervical spine were also generated. COMPARISON:  Head CT 03/21/2018. Head and cervical spine CT 07/21/2017 FINDINGS: CT HEAD FINDINGS Brain: No intracranial hemorrhage. Stable ventriculomegaly. Stable but advanced periventricular white matter changes. Incidental mega cisterna magna. No subdural or extra-axial fluid collection. No evidence of acute ischemia. Vascular: Atherosclerosis of skull base vasculature without hyperdense vessel. Skull: No fracture or focal lesion. Sinuses/Orbits: No acute finding. Other: None. CT CERVICAL SPINE FINDINGS Alignment: Reversal of normal lordosis.  No traumatic subluxation. Skull base and vertebrae: No acute fracture. Vertebral body heights are maintained. The dens and skull base are intact. Soft tissues and spinal canal: No prevertebral fluid or swelling. No visible canal hematoma. Disc levels: Disc space narrowing and endplate spurring O7-S9, C5-C6, and C6-C7, stable from prior. Upper chest: Negative. Other: None. IMPRESSION: 1.  No acute intracranial abnormality.  No skull fracture. 2. Stable ventriculomegaly and chronic small vessel ischemia. 3. Degenerative change in the cervical spine without acute fracture or subluxation. Electronically Signed   By: Jeb Levering M.D.    On: 05/09/2018 03:12   Ct Cervical Spine Wo Contrast  Result Date: 05/09/2018 CLINICAL DATA:  Unwitnessed fall at nursing facility. Lacerations to forehead. EXAM: CT HEAD WITHOUT CONTRAST CT CERVICAL SPINE WITHOUT CONTRAST TECHNIQUE: Multidetector CT imaging of the head and cervical spine was performed following the standard protocol without intravenous contrast. Multiplanar CT image reconstructions of the cervical spine were also generated. COMPARISON:  Head CT 03/21/2018. Head and cervical spine CT 07/21/2017 FINDINGS: CT HEAD FINDINGS Brain: No intracranial hemorrhage. Stable ventriculomegaly. Stable but advanced periventricular white matter changes. Incidental mega cisterna magna. No subdural or extra-axial fluid collection. No evidence of acute ischemia. Vascular: Atherosclerosis of skull base vasculature without hyperdense vessel. Skull: No fracture or focal lesion. Sinuses/Orbits: No acute finding. Other: None. CT CERVICAL SPINE FINDINGS Alignment: Reversal of normal lordosis.  No traumatic subluxation. Skull base and vertebrae: No acute fracture. Vertebral body heights are maintained. The dens and skull base are intact. Soft tissues and spinal canal: No prevertebral fluid or swelling. No visible canal hematoma. Disc levels: Disc space narrowing and endplate spurring G2-E3, C5-C6, and C6-C7, stable from prior. Upper chest: Negative. Other: None. IMPRESSION: 1.  No acute intracranial abnormality.  No skull fracture. 2. Stable ventriculomegaly and chronic small vessel ischemia. 3. Degenerative change in the cervical spine without acute fracture or subluxation. Electronically Signed   By: Jeb Levering M.D.   On: 05/09/2018 03:12    ED COURSE and MDM  Nursing notes and initial vitals signs, including pulse oximetry, reviewed.  Vitals:   05/09/18 0255 05/09/18 0301  BP: (!) 154/69 (!) 154/69  Pulse:  (!) 53  Resp:  18  SpO2:  97%   3:16 AM Primary closure of the patient's wounds is not  indicated disease are essentially skin tears.  They have been dressed with QuikClot and are now hemostatic.  There is no radiographic evidence of significant acute injury.  PROCEDURES    ED DIAGNOSES     ICD-10-CM   1. Fall at nursing home, initial encounter W19.XXXA    Y92.129   2. Laceration of skin of forehead, initial encounter S01.81XA   3. Traumatic hematoma of forehead, initial encounter S00.83XA        Lyllie Cobbins, Jenny Reichmann, MD 05/09/18 [phone]

## 2018-05-09 NOTE — ED Notes (Signed)
Attempted to call POA, no answer or answering service to leave message.

## 2018-05-09 NOTE — ED Notes (Signed)
[facility] called for transportation  

## 2018-05-09 NOTE — ED Notes (Signed)
Bed: WA21 Expected date:  Expected time:  Means of arrival:  Comments: 

## 2018-05-09 NOTE — ED Triage Notes (Signed)
Pt from facility and had an unwitnessed fall, he has lacerations to his forehead and nose

## 2018-05-09 NOTE — ED Notes (Signed)
Phone report given to [facility], med tech at [facility].

## 2018-05-31 ENCOUNTER — Other Ambulatory Visit: Payer: Self-pay

## 2018-05-31 ENCOUNTER — Emergency Department (HOSPITAL_COMMUNITY): Payer: Medicare Other

## 2018-05-31 ENCOUNTER — Emergency Department (HOSPITAL_COMMUNITY)
Admission: EM | Admit: 2018-05-31 | Discharge: 2018-05-31 | Disposition: A | Discharge status: Home / Self Care | Payer: Medicare Other | Attending: Emergency Medicine | Admitting: Emergency Medicine

## 2018-05-31 ENCOUNTER — Encounter (HOSPITAL_COMMUNITY): Payer: Self-pay

## 2018-05-31 DIAGNOSIS — Y92129 Unspecified place in nursing home as the place of occurrence of the external cause: Secondary | ICD-10-CM | POA: Diagnosis not present

## 2018-05-31 DIAGNOSIS — I110 Hypertensive heart disease with heart failure: Secondary | ICD-10-CM | POA: Diagnosis not present

## 2018-05-31 DIAGNOSIS — Y999 Unspecified external cause status: Secondary | ICD-10-CM | POA: Diagnosis not present

## 2018-05-31 DIAGNOSIS — G20 Parkinson's disease: Secondary | ICD-10-CM | POA: Diagnosis not present

## 2018-05-31 DIAGNOSIS — S0081XA Abrasion of other part of head, initial encounter: Secondary | ICD-10-CM | POA: Diagnosis not present

## 2018-05-31 DIAGNOSIS — E119 Type 2 diabetes mellitus without complications: Secondary | ICD-10-CM | POA: Insufficient documentation

## 2018-05-31 DIAGNOSIS — S098XXA Other specified injuries of head, initial encounter: Secondary | ICD-10-CM | POA: Diagnosis present

## 2018-05-31 DIAGNOSIS — S80811A Abrasion, right lower leg, initial encounter: Secondary | ICD-10-CM | POA: Insufficient documentation

## 2018-05-31 DIAGNOSIS — Z7982 Long term (current) use of aspirin: Secondary | ICD-10-CM | POA: Insufficient documentation

## 2018-05-31 DIAGNOSIS — Y939 Activity, unspecified: Secondary | ICD-10-CM | POA: Diagnosis not present

## 2018-05-31 DIAGNOSIS — I5032 Chronic diastolic (congestive) heart failure: Secondary | ICD-10-CM | POA: Diagnosis not present

## 2018-05-31 DIAGNOSIS — Z7902 Long term (current) use of antithrombotics/antiplatelets: Secondary | ICD-10-CM | POA: Insufficient documentation

## 2018-05-31 DIAGNOSIS — W19XXXA Unspecified fall, initial encounter: Secondary | ICD-10-CM

## 2018-05-31 DIAGNOSIS — Z79899 Other long term (current) drug therapy: Secondary | ICD-10-CM | POA: Insufficient documentation

## 2018-05-31 DIAGNOSIS — F0281 Dementia in other diseases classified elsewhere with behavioral disturbance: Secondary | ICD-10-CM | POA: Insufficient documentation

## 2018-05-31 DIAGNOSIS — W1830XA Fall on same level, unspecified, initial encounter: Secondary | ICD-10-CM | POA: Insufficient documentation

## 2018-05-31 LAB — COMPREHENSIVE METABOLIC PANEL
ALBUMIN: 3.6 g/dL (ref 3.5–5.0)
ALT: 13 U/L — ABNORMAL LOW (ref 17–63)
ANION GAP: 6 (ref 5–15)
AST: 19 U/L (ref 15–41)
Alkaline Phosphatase: 99 U/L (ref 38–126)
BUN: 26 mg/dL — ABNORMAL HIGH (ref 6–20)
CALCIUM: 8.8 mg/dL — AB (ref 8.9–10.3)
CO2: 28 mmol/L (ref 22–32)
Chloride: 107 mmol/L (ref 101–111)
Creatinine, Ser: 1.14 mg/dL (ref 0.61–1.24)
GFR calc non Af Amer: 58 mL/min — ABNORMAL LOW (ref >60)
GLUCOSE: 106 mg/dL — AB (ref 65–99)
Potassium: 4 mmol/L (ref 3.5–5.1)
SODIUM: 141 mmol/L (ref 135–145)
Total Bilirubin: 0.4 mg/dL (ref 0.3–1.2)
Total Protein: 5.4 g/dL — ABNORMAL LOW (ref 6.5–8.1)

## 2018-05-31 LAB — CBC WITH DIFFERENTIAL/PLATELET
BASOS PCT: 0 %
Basophils Absolute: 0 10*3/uL (ref 0.0–0.1)
Eosinophils Absolute: 0.4 10*3/uL (ref 0.0–0.7)
Eosinophils Relative: 5 %
HCT: 39.9 % (ref 39.0–52.0)
HEMOGLOBIN: 13.1 g/dL (ref 13.0–17.0)
LYMPHS PCT: 11 %
Lymphs Abs: 1 10*3/uL (ref 0.7–4.0)
MCH: 30.2 pg (ref 26.0–34.0)
MCHC: 32.8 g/dL (ref 30.0–36.0)
MCV: 91.9 fL (ref 78.0–100.0)
MONO ABS: 0.5 10*3/uL (ref 0.1–1.0)
MONOS PCT: 6 %
NEUTROS ABS: 6.9 10*3/uL (ref 1.7–7.7)
NEUTROS PCT: 78 %
Platelets: 175 10*3/uL (ref 150–400)
RBC: 4.34 MIL/uL (ref 4.22–5.81)
RDW: 14.3 % (ref 11.5–15.5)
WBC: 8.8 10*3/uL (ref 4.0–10.5)

## 2018-05-31 LAB — CBG MONITORING, ED: GLUCOSE-CAPILLARY: 145 mg/dL — AB (ref 65–99)

## 2018-05-31 MED ORDER — HALOPERIDOL LACTATE 5 MG/ML IJ SOLN
2.0000 mg | Freq: Four times a day (QID) | INTRAMUSCULAR | Status: DC | PRN
  Filled 2018-05-31: qty 1

## 2018-05-31 MED ORDER — HALOPERIDOL LACTATE 5 MG/ML IJ SOLN
5.0000 mg | Freq: Four times a day (QID) | INTRAMUSCULAR | Status: DC | PRN
  Administered 2018-05-31: 5 mg via INTRAVENOUS

## 2018-05-31 NOTE — ED Notes (Signed)
Pt is verbally aggressive not allowing this RN to obtain IV access or blood work at this time.  Made Abigail PA aware.  New orders will be obtained for IM haldol and re-attempt for IV access and blood work when pt is more calm and cooperative.

## 2018-05-31 NOTE — ED Notes (Signed)
[facility] called for transport.  

## 2018-05-31 NOTE — Discharge Instructions (Signed)
Ask for help whenever you: Are not sure if you are able to move around safely. Feel dizzy or unsteady. Are not comfortable helping your loved one move around or use the bathroom.

## 2018-05-31 NOTE — ED Triage Notes (Signed)
Per EMS:  Pt had an unwitnessed fall out of his wheelchair.  There is a laceration on his R lower leg, and abrasion above R eye.  No loss of conscious and alert and oriented at his baseline.

## 2018-05-31 NOTE — ED Provider Notes (Signed)
[facility] DEPT Provider Note   CSN: [id number] Arrival date & time: 05/31/18  1018     History   Chief Complaint Chief Complaint  Patient presents with  . Fall  . Laceration    HPI Dwayne Manard. is a 83 y.o. male who presents to the emergency department for unwitnessed fall.  He has a history of Parkinson's disease and dementia with behavioral disturbances.  I spoke with his healthcare power of attorney.  According to her the patient was found trying to get back into his wheelchair.  He has incidences of trying to go to the bathroom and walking forgetting to use his wheelchair.  He was found to have an abrasion to the forehead and a small area of bleeding from the right shin.  He is on Plavix.  HPI  Past Medical History:  Diagnosis Date  . Anxiety   . Arthritis   . Benign prostate hyperplasia   . Bipolar disorder ([place])   . C2 cervical fracture ([name])    due to pt fall  . Chronic indwelling Foley catheter   . Depression   . Diabetes mellitus type II   . Falls   . Flu   . Hearing aid worn   . Hypertension   . Memory difficulty 08/21/2014  . Memory loss   . MRSA infection (methicillin-resistant Staphylococcus aureus)   . Neuromuscular disorder ([place])    parkinsons  . Parkinson disease ([place])   . Pneumonia   . SIRS (systemic inflammatory response syndrome) ([name])   . UTI (urinary tract infection)     Patient Active Problem List   Diagnosis Date Noted  . Catheter-associated urinary tract infection ([place]) 03/21/2018  . 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 Pyelonephritis 08/06/2017  . Contusion of head 08/06/2017  . Pressure injury of skin 08/06/2017  . Complicated UTI (urinary tract infection) 08/05/2017  . CAP (community acquired pneumonia) 11/29/2016  . Bacteremia 03/14/2016  . HCAP (healthcare-associated pneumonia)   . Pressure ulcer 10/01/2015  . Right lower lobe pneumonia ([place])  10/01/2015  . Sepsis due to Gram-negative organism with acute respiratory failure ([place]) 10/01/2015  . Acute renal failure ([place]) 10/01/2015  . Acute respiratory failure with hypoxia ([place]) 09/30/2015  . Influenza A (H1N1) 03/25/2015  . Hyperkalemia 03/25/2015  . Falls 03/16/2015  . Sacral pressure ulcer 03/16/2015  . Fever 03/16/2015  . Memory difficulty 08/21/2014  . Malnutrition of moderate degree ([place]) 04/19/2014  . UTI (lower urinary tract infection) 04/18/2014  . Hypoxia 04/18/2014  . PNA (pneumonia) 04/18/2014  . Chronic diastolic congestive heart failure ([place]) 02/01/2013  . Pericardial effusion 02/01/2013  . Bradycardia 01/31/2013  . Empyema lung ([place]) 12/23/2012  . S/P thoracotomy 12/23/2012  . Abnormality of gait 12/05/2012  . Hypernatremia 08/13/2012  . Hypertension   . Diabetes mellitus, type 2 ([place])   . Arthritis   . Bipolar disorder ([place])   . Anxiety   . Chronic indwelling Foley catheter   . Neuromuscular disorder ([place])   . Bacteremia of undetermined etiology 06/17/2012  . Leukocytosis 04/17/2012  . Parkinson disease ([place]) 04/16/2012  . Elevated troponin 04/16/2012  . Hearing aid worn   . Diabetes mellitus ([place]) 07/06/2011    Past Surgical History:  Procedure Laterality Date  . TOTAL HIP ARTHROPLASTY    . VIDEO ASSISTED THORACOSCOPY (VATS)/EMPYEMA  12/16/2012   Procedure: VIDEO ASSISTED THORACOSCOPY (VATS)/EMPYEMA;  Surgeon: Melrose Nakayama, MD;  Location: [facility];  Service:  Thoracic;  Laterality: Right;  Marland Kitchen VIDEO BRONCHOSCOPY  12/16/2012   Procedure: VIDEO BRONCHOSCOPY;  Surgeon: Melrose Nakayama, MD;  Location: [facility];  Service: Thoracic;  Laterality: Right;        Home Medications    Prior to Admission medications   Medication Sig Start Date End Date Taking? Authorizing Provider  acetaminophen (TYLENOL) 325 MG tablet Take 650 mg by mouth every 6 (six) hours as needed (pain). Not to exceed 8 tabs per 24hrs.    [provider]  albuterol  (PROVENTIL HFA;VENTOLIN HFA) 108 (90 Base) MCG/ACT inhaler Inhale 1-2 puffs into the lungs every 6 (six) hours as needed for wheezing or shortness of breath. Patient taking differently: Inhale 2 puffs into the lungs every 6 (six) hours as needed for wheezing or shortness of breath.  01/16/18   Kinnie Feil, PA-C  aspirin 81 MG chewable tablet Chew 81 mg by mouth daily.    [provider]  carbidopa-levodopa (SINEMET IR) 25-250 MG per tablet Take 0.5 tablets by mouth 3 (three) times daily. At 7am, 2pm, 7pm.    [provider]  cefdinir (OMNICEF) 300 MG capsule Take 1 capsule (300 mg total) by mouth 2 (two) times daily. 03/23/18   Purohit, Konrad Dolores, MD  clonazePAM (KLONOPIN) 0.5 MG tablet Take 0.5 tablets (0.25 mg total) by mouth 2 (two) times daily as needed (anxiety). 08/08/17   Mariel Aloe, MD  clopidogrel (PLAVIX) 75 MG tablet Take 1 tablet (75 mg total) by mouth daily. 10/07/15   Florencia Reasons, MD  entacapone (COMTAN) 200 MG tablet Take 200 mg by mouth 3 (three) times daily. At 7am 2pm, 7pm.    [provider]  feeding supplement, GLUCERNA SHAKE, (GLUCERNA SHAKE) LIQD Take 237 mLs by mouth 2 (two) times daily between meals. 03/16/16   Kelvin Cellar, MD  ferrous sulfate 325 (65 FE) MG tablet Take 325 mg by mouth 2 (two) times daily.    [provider]  isosorbide mononitrate (IMDUR) 30 MG 24 hr tablet Take 1 tablet (30 mg total) by mouth daily. 10/07/15   Florencia Reasons, MD  lamoTRIgine (LAMICTAL) 150 MG tablet Take 1 tablet (150 mg total) by mouth 2 (two) times daily. 07/17/17   Arfeen, Arlyce Harman, MD  Multiple Vitamin (MULTIVITAMIN WITH MINERALS) TABS tablet Take 1 tablet by mouth daily.    [provider]  nystatin (NYSTATIN) powder Apply 1 g topically 2 (two) times daily. Apply to groin for redness    [provider]  pantoprazole (PROTONIX) 40 MG tablet Take 40 mg by mouth daily. Reported on 07/06/2016    [provider]  polyethylene glycol  (MIRALAX / GLYCOLAX) packet Take 17 g by mouth daily.    [provider]  predniSONE (DELTASONE) 2.5 MG tablet Take 2.5 mg by mouth daily with breakfast.    [provider]  risperiDONE (RISPERDAL) 0.25 MG tablet Take 0.25 mg by mouth 3 (three) times daily.    [provider]  rosuvastatin (CRESTOR) 20 MG tablet Take 20 mg by mouth daily.    [provider]  sertraline (ZOLOFT) 100 MG tablet Take 2 tablets (200 mg total) by mouth daily. Patient taking differently: Take 300 mg by mouth daily.  07/17/17   Arfeen, Arlyce Harman, MD  traZODone (DESYREL) 50 MG tablet Take 1 tablet (50 mg total) by mouth at bedtime. 01/17/17   Arfeen, Arlyce Harman, MD    Family History Family History  Problem Relation Age of Onset  .  Depression Father   . Cancer Mother   . Heart disease Mother     Social History Social History   Tobacco Use  . Smoking status: Never Smoker  . Smokeless tobacco: Never Used  Substance Use Topics  . Alcohol use: No    Alcohol/week: 0.0 oz    Comment: occasional  . Drug use: No     Allergies   Plant sterols and stanols   Review of Systems Review of Systems  Unable to review systems due to dementia Physical Exam Updated Vital Signs BP 106/74 (BP Location: Left Arm)   Pulse (!) 56   Temp 97.6 F (36.4 C) (Oral)   Ht 5\' 10"  (1.778 m)   Wt 77.1 kg (170 lb)   SpO2 98%   BMI 24.39 kg/m   Physical Exam  Constitutional: He appears well-developed and well-nourished. No distress.  HENT:  Head: Normocephalic.    Abrasion to the right forehead  Eyes: Conjunctivae are normal. No scleral icterus.  Neck: Normal range of motion. Neck supple.  Cardiovascular: Normal rate, regular rhythm and normal heart sounds.  Pulmonary/Chest: Effort normal and breath sounds normal. No respiratory distress.  Abdominal: Soft. There is no tenderness.  Musculoskeletal: He exhibits no edema.       Legs: Neurological: He is alert.  Skin: Skin is warm and dry.  He is not diaphoretic.  Psychiatric: His behavior is normal.  Nursing note and vitals reviewed.    ED Treatments / Results  Labs (all labs ordered are listed, but only abnormal results are displayed) Labs Reviewed  COMPREHENSIVE METABOLIC PANEL  CBC WITH DIFFERENTIAL/PLATELET  CBG MONITORING, ED    EKG None  Radiology No results found.  Procedures Procedures (including critical care time)  Medications Ordered in ED Medications  haloperidol lactate (HALDOL) injection 2 mg (has no administration in time range)     Initial Impression / Assessment and Plan / ED Course  I have reviewed the triage vital signs and the nursing notes.  Pertinent labs & imaging results that were available during my care of the patient were reviewed by me and considered in my medical decision making (see chart for details).     Patient work up negative  Abrasions bandaged. Evidence of intracranial bleed, fractures subluxations or dislocations.  Patient will be discharged to his ith his PCP Final Clinical Impressions(s) / ED Diagnoses   Final diagnoses:  None    ED Discharge Orders    None       Margarita Mail, PA-C 05/31/18 1623    Nat Christen, MD 06/01/18 [phone]

## 2018-11-17 DEATH — deceased

## 2019-04-17 ENCOUNTER — Telehealth: Payer: Self-pay

## 2019-04-17 NOTE — Telephone Encounter (Signed)
I contacted the pt and left a vm for the pt to call back to discuss 04/21/19 visit.  Due to current COVID 19 pandemic, our office is severely reducing in office visits for at least the next 2 weeks, in order to minimize the risk to our patients and healthcare providers.   Pt needs to be offered a video visit first through [email or web address] or a telephone visit. Video visit is preferable.

## 2019-04-21 ENCOUNTER — Ambulatory Visit: Payer: Medicare Other | Admitting: Neurology

## 2019-04-21 NOTE — Telephone Encounter (Signed)
PT has been removed from 04/21/19 schedule due to no call being received in confirmation of appt.
